# Patient Record
Sex: Male | Born: 1940 | Race: Black or African American | Hispanic: No | Marital: Single | Smoking: Former smoker
Health system: Southern US, Community
[De-identification: ages and names within clinical notes are randomized; demographics above are authoritative.]

## PROBLEM LIST (undated history)

## (undated) VITALS — BP 115/63 | HR 97 | Temp 98.0°F | Resp 24 | Ht 66.97 in | Wt 206.8 lb

## (undated) DIAGNOSIS — E612 Magnesium deficiency: Secondary | ICD-10-CM

## (undated) DIAGNOSIS — I5042 Chronic combined systolic (congestive) and diastolic (congestive) heart failure: Secondary | ICD-10-CM

## (undated) DIAGNOSIS — N3644 Muscular disorders of urethra: Secondary | ICD-10-CM

## (undated) DIAGNOSIS — R32 Unspecified urinary incontinence: Secondary | ICD-10-CM

## (undated) DIAGNOSIS — I714 Abdominal aortic aneurysm, without rupture, unspecified: Secondary | ICD-10-CM

## (undated) DIAGNOSIS — C679 Malignant neoplasm of bladder, unspecified: Secondary | ICD-10-CM

## (undated) DIAGNOSIS — F0390 Unspecified dementia without behavioral disturbance: Secondary | ICD-10-CM

## (undated) DIAGNOSIS — N3281 Overactive bladder: Secondary | ICD-10-CM

## (undated) DIAGNOSIS — Z992 Dependence on renal dialysis: Secondary | ICD-10-CM

## (undated) DIAGNOSIS — I723 Aneurysm of iliac artery: Secondary | ICD-10-CM

## (undated) DIAGNOSIS — J449 Chronic obstructive pulmonary disease, unspecified: Secondary | ICD-10-CM

## (undated) DIAGNOSIS — I639 Cerebral infarction, unspecified: Secondary | ICD-10-CM

## (undated) DIAGNOSIS — N186 End stage renal disease: Secondary | ICD-10-CM

## (undated) DIAGNOSIS — R41841 Cognitive communication deficit: Secondary | ICD-10-CM

## (undated) DIAGNOSIS — I499 Cardiac arrhythmia, unspecified: Secondary | ICD-10-CM

## (undated) DIAGNOSIS — I10 Essential (primary) hypertension: Secondary | ICD-10-CM

## (undated) DIAGNOSIS — G459 Transient cerebral ischemic attack, unspecified: Secondary | ICD-10-CM

## (undated) DIAGNOSIS — K219 Gastro-esophageal reflux disease without esophagitis: Secondary | ICD-10-CM

## (undated) DIAGNOSIS — C61 Malignant neoplasm of prostate: Secondary | ICD-10-CM

## (undated) DIAGNOSIS — E7800 Pure hypercholesterolemia, unspecified: Secondary | ICD-10-CM

## (undated) DIAGNOSIS — E119 Type 2 diabetes mellitus without complications: Secondary | ICD-10-CM

## (undated) DIAGNOSIS — I480 Paroxysmal atrial fibrillation: Secondary | ICD-10-CM

## (undated) DIAGNOSIS — Z8619 Personal history of other infectious and parasitic diseases: Secondary | ICD-10-CM

## (undated) DIAGNOSIS — N1330 Unspecified hydronephrosis: Secondary | ICD-10-CM

## (undated) DIAGNOSIS — J309 Allergic rhinitis, unspecified: Secondary | ICD-10-CM

## (undated) DIAGNOSIS — Z8719 Personal history of other diseases of the digestive system: Secondary | ICD-10-CM

## (undated) DIAGNOSIS — D649 Anemia, unspecified: Secondary | ICD-10-CM

## (undated) HISTORY — DX: Transient cerebral ischemic attack, unspecified: G45.9

## (undated) HISTORY — DX: Type 2 diabetes mellitus without complications: E11.9

## (undated) HISTORY — PX: CYSTOSCOPY W/ URETERAL STENT PLACEMENT: SHX1429

## (undated) HISTORY — DX: Unspecified hydronephrosis: N13.30

## (undated) HISTORY — PX: TRANSURETHRAL RESECTION OF BLADDER TUMOR WITH GYRUS (TURBT-GYRUS): SHX6458

## (undated) HISTORY — PX: URETERAL STENT PLACEMENT: SHX822

## (undated) HISTORY — DX: Muscular disorders of urethra: N36.44

## (undated) HISTORY — DX: Overactive bladder: N32.81

## (undated) HISTORY — DX: Cerebral infarction, unspecified: I63.9

## (undated) HISTORY — PX: AV FISTULA PLACEMENT: SHX1204

## (undated) HISTORY — PX: PROSTATE SURGERY: SHX751

## (undated) HISTORY — DX: Aneurysm of iliac artery: I72.3

---

## 2006-05-14 ENCOUNTER — Emergency Department: Payer: Self-pay | Admitting: Emergency Medicine

## 2006-12-31 ENCOUNTER — Emergency Department: Payer: Self-pay | Admitting: General Practice

## 2007-01-06 ENCOUNTER — Ambulatory Visit: Payer: Self-pay | Admitting: Urology

## 2007-01-07 ENCOUNTER — Inpatient Hospital Stay: Payer: Self-pay | Admitting: Urology

## 2007-01-07 ENCOUNTER — Other Ambulatory Visit: Payer: Self-pay

## 2007-01-08 ENCOUNTER — Other Ambulatory Visit: Payer: Self-pay

## 2007-01-21 ENCOUNTER — Ambulatory Visit: Payer: Self-pay | Admitting: Vascular Surgery

## 2007-02-18 ENCOUNTER — Ambulatory Visit: Payer: Self-pay | Admitting: Vascular Surgery

## 2007-03-04 ENCOUNTER — Ambulatory Visit: Payer: Self-pay | Admitting: Vascular Surgery

## 2007-03-11 ENCOUNTER — Inpatient Hospital Stay: Payer: Self-pay | Admitting: Vascular Surgery

## 2007-03-12 ENCOUNTER — Other Ambulatory Visit: Payer: Self-pay

## 2007-10-19 ENCOUNTER — Other Ambulatory Visit: Payer: Self-pay

## 2007-10-19 ENCOUNTER — Inpatient Hospital Stay: Payer: Self-pay | Admitting: Internal Medicine

## 2009-09-04 ENCOUNTER — Inpatient Hospital Stay: Payer: Self-pay | Admitting: Internal Medicine

## 2010-06-07 ENCOUNTER — Emergency Department: Payer: Self-pay | Admitting: Emergency Medicine

## 2011-02-26 ENCOUNTER — Other Ambulatory Visit: Payer: Self-pay | Admitting: Nurse Practitioner

## 2011-03-18 ENCOUNTER — Ambulatory Visit: Payer: Self-pay | Admitting: Gastroenterology

## 2011-04-29 ENCOUNTER — Emergency Department: Payer: Self-pay | Admitting: Emergency Medicine

## 2011-07-18 ENCOUNTER — Ambulatory Visit: Payer: Self-pay | Admitting: Urology

## 2011-07-19 ENCOUNTER — Ambulatory Visit: Payer: Self-pay | Admitting: Urology

## 2011-07-19 DIAGNOSIS — I10 Essential (primary) hypertension: Secondary | ICD-10-CM

## 2011-07-23 ENCOUNTER — Ambulatory Visit: Payer: Self-pay | Admitting: Urology

## 2011-07-25 LAB — PATHOLOGY REPORT

## 2011-07-27 ENCOUNTER — Inpatient Hospital Stay: Payer: Self-pay

## 2011-07-28 ENCOUNTER — Ambulatory Visit: Payer: Self-pay | Admitting: Urology

## 2011-07-30 ENCOUNTER — Ambulatory Visit: Payer: Self-pay | Admitting: Urology

## 2011-08-22 ENCOUNTER — Ambulatory Visit: Payer: Self-pay | Admitting: Internal Medicine

## 2011-08-24 ENCOUNTER — Ambulatory Visit: Payer: Self-pay | Admitting: Internal Medicine

## 2011-09-05 ENCOUNTER — Ambulatory Visit: Payer: Self-pay | Admitting: Internal Medicine

## 2011-09-06 LAB — PSA: PSA: 0.8 ng/mL (ref 0.0–4.0)

## 2011-09-23 ENCOUNTER — Ambulatory Visit: Payer: Self-pay | Admitting: Urology

## 2011-09-24 ENCOUNTER — Ambulatory Visit: Payer: Self-pay | Admitting: Internal Medicine

## 2011-10-01 ENCOUNTER — Ambulatory Visit: Payer: Self-pay | Admitting: Urology

## 2011-10-03 LAB — PATHOLOGY REPORT

## 2011-10-25 ENCOUNTER — Ambulatory Visit: Payer: Self-pay | Admitting: Urology

## 2011-11-06 ENCOUNTER — Ambulatory Visit: Payer: Self-pay | Admitting: Urology

## 2011-11-06 LAB — PLATELET COUNT: Platelet: 284 10*3/uL (ref 150–440)

## 2011-11-06 LAB — PROTIME-INR: INR: 1

## 2011-11-06 LAB — APTT: Activated PTT: 37.9 secs — ABNORMAL HIGH (ref 23.6–35.9)

## 2011-11-21 ENCOUNTER — Ambulatory Visit: Payer: Self-pay | Admitting: Urology

## 2011-11-21 LAB — PROTIME-INR: INR: 1

## 2011-11-21 LAB — PLATELET COUNT: Platelet: 293 10*3/uL (ref 150–440)

## 2011-12-04 ENCOUNTER — Ambulatory Visit: Payer: Self-pay | Admitting: Urology

## 2011-12-04 LAB — APTT: Activated PTT: 31.7 secs (ref 23.6–35.9)

## 2011-12-04 LAB — HEMOGLOBIN: HGB: 10.5 g/dL — ABNORMAL LOW (ref 13.0–18.0)

## 2011-12-04 LAB — HEMATOCRIT: HCT: 30.3 % — ABNORMAL LOW (ref 40.0–52.0)

## 2011-12-04 LAB — PROTIME-INR
INR: 1
Prothrombin Time: 13.4 secs (ref 11.5–14.7)

## 2011-12-05 LAB — CBC WITH DIFFERENTIAL/PLATELET
Basophil %: 0.2 %
Eosinophil %: 0.3 %
HCT: 25.7 % — ABNORMAL LOW (ref 40.0–52.0)
HGB: 8.5 g/dL — ABNORMAL LOW (ref 13.0–18.0)
Lymphocyte %: 9.2 %
Monocyte #: 0.9 10*3/uL — ABNORMAL HIGH (ref 0.0–0.7)
Monocyte %: 8.6 %
Neutrophil #: 8.8 10*3/uL — ABNORMAL HIGH (ref 1.4–6.5)
Neutrophil %: 81.7 %
Platelet: 275 10*3/uL (ref 150–440)
WBC: 10.7 10*3/uL — ABNORMAL HIGH (ref 3.8–10.6)

## 2011-12-05 LAB — BASIC METABOLIC PANEL
Calcium, Total: 8.2 mg/dL — ABNORMAL LOW (ref 8.5–10.1)
Chloride: 106 mmol/L (ref 98–107)
Glucose: 104 mg/dL — ABNORMAL HIGH (ref 65–99)
Potassium: 3.9 mmol/L (ref 3.5–5.1)
Sodium: 139 mmol/L (ref 136–145)

## 2011-12-08 LAB — COMPREHENSIVE METABOLIC PANEL
Albumin: 2.9 g/dL — ABNORMAL LOW (ref 3.4–5.0)
Alkaline Phosphatase: 82 U/L (ref 50–136)
BUN: 27 mg/dL — ABNORMAL HIGH (ref 7–18)
Calcium, Total: 9.1 mg/dL (ref 8.5–10.1)
Creatinine: 2.43 mg/dL — ABNORMAL HIGH (ref 0.60–1.30)
Glucose: 109 mg/dL — ABNORMAL HIGH (ref 65–99)
Osmolality: 289 (ref 275–301)
Potassium: 3.7 mmol/L (ref 3.5–5.1)
SGOT(AST): 15 U/L (ref 15–37)
SGPT (ALT): 9 U/L — ABNORMAL LOW
Total Protein: 7.5 g/dL (ref 6.4–8.2)

## 2011-12-08 LAB — URINALYSIS, COMPLETE
Ketone: NEGATIVE
Nitrite: POSITIVE
Ph: 6 (ref 4.5–8.0)
Protein: 30
RBC,UR: 48415 /HPF (ref 0–5)
WBC UR: 1270 /HPF (ref 0–5)

## 2011-12-08 LAB — PROTIME-INR
INR: 1
Prothrombin Time: 13.9 secs (ref 11.5–14.7)

## 2011-12-08 LAB — CBC
HCT: 29.9 % — ABNORMAL LOW (ref 40.0–52.0)
HGB: 9.8 g/dL — ABNORMAL LOW (ref 13.0–18.0)
MCH: 27.5 pg (ref 26.0–34.0)
MCV: 84 fL (ref 80–100)
RBC: 3.56 10*6/uL — ABNORMAL LOW (ref 4.40–5.90)
RDW: 16.1 % — ABNORMAL HIGH (ref 11.5–14.5)
WBC: 10 10*3/uL (ref 3.8–10.6)

## 2011-12-08 LAB — CK: CK, Total: 40 U/L (ref 35–232)

## 2011-12-08 LAB — PRO B NATRIURETIC PEPTIDE: B-Type Natriuretic Peptide: 889 pg/mL — ABNORMAL HIGH (ref 0–125)

## 2011-12-08 LAB — TROPONIN I: Troponin-I: 0.06 ng/mL — ABNORMAL HIGH

## 2011-12-08 LAB — CK TOTAL AND CKMB (NOT AT ARMC): CK, Total: 48 U/L (ref 35–232)

## 2011-12-09 LAB — CBC WITH DIFFERENTIAL/PLATELET
Basophil %: 0.4 %
Eosinophil %: 2.8 %
Lymphocyte #: 1.5 10*3/uL (ref 1.0–3.6)
MCV: 83 fL (ref 80–100)
Monocyte %: 12.3 %
Neutrophil %: 69.1 %
Platelet: 302 10*3/uL (ref 150–440)
RBC: 3.18 10*6/uL — ABNORMAL LOW (ref 4.40–5.90)
RDW: 15.8 % — ABNORMAL HIGH (ref 11.5–14.5)

## 2011-12-09 LAB — BASIC METABOLIC PANEL
Anion Gap: 8 (ref 7–16)
BUN: 27 mg/dL — ABNORMAL HIGH (ref 7–18)
Chloride: 110 mmol/L — ABNORMAL HIGH (ref 98–107)
Co2: 22 mmol/L (ref 21–32)
Creatinine: 2.41 mg/dL — ABNORMAL HIGH (ref 0.60–1.30)
EGFR (African American): 34 — ABNORMAL LOW
EGFR (Non-African Amer.): 28 — ABNORMAL LOW
Glucose: 112 mg/dL — ABNORMAL HIGH (ref 65–99)
Osmolality: 285 (ref 275–301)
Potassium: 4 mmol/L (ref 3.5–5.1)
Sodium: 140 mmol/L (ref 136–145)

## 2011-12-09 LAB — CK TOTAL AND CKMB (NOT AT ARMC)
CK, Total: 35 U/L (ref 35–232)
CK-MB: 0.6 ng/mL (ref 0.5–3.6)

## 2011-12-10 ENCOUNTER — Inpatient Hospital Stay: Payer: Self-pay | Admitting: Internal Medicine

## 2011-12-10 LAB — BASIC METABOLIC PANEL
Anion Gap: 15 (ref 7–16)
BUN: 31 mg/dL — ABNORMAL HIGH (ref 7–18)
Creatinine: 2.69 mg/dL — ABNORMAL HIGH (ref 0.60–1.30)
EGFR (African American): 30 — ABNORMAL LOW
EGFR (Non-African Amer.): 25 — ABNORMAL LOW
Glucose: 121 mg/dL — ABNORMAL HIGH (ref 65–99)
Osmolality: 295 (ref 275–301)

## 2011-12-10 LAB — CBC WITH DIFFERENTIAL/PLATELET
Eosinophil #: 0.4 10*3/uL (ref 0.0–0.7)
Lymphocyte %: 20.3 %
MCH: 27.3 pg (ref 26.0–34.0)
MCHC: 32.8 g/dL (ref 32.0–36.0)
Monocyte #: 0.9 10*3/uL — ABNORMAL HIGH (ref 0.0–0.7)
Neutrophil %: 65 %
Platelet: 321 10*3/uL (ref 150–440)
RBC: 3.1 10*6/uL — ABNORMAL LOW (ref 4.40–5.90)
RDW: 16.1 % — ABNORMAL HIGH (ref 11.5–14.5)
WBC: 8.9 10*3/uL (ref 3.8–10.6)

## 2011-12-10 LAB — PHOSPHORUS: Phosphorus: 3.7 mg/dL (ref 2.5–4.9)

## 2011-12-11 LAB — CBC WITH DIFFERENTIAL/PLATELET
Basophil %: 0.4 %
Eosinophil %: 6.1 %
HGB: 7.6 g/dL — ABNORMAL LOW (ref 13.0–18.0)
Lymphocyte #: 1.7 10*3/uL (ref 1.0–3.6)
MCH: 27.1 pg (ref 26.0–34.0)
MCV: 84 fL (ref 80–100)
Monocyte #: 0.7 10*3/uL (ref 0.0–0.7)
Monocyte %: 9.5 %
Neutrophil #: 4.6 10*3/uL (ref 1.4–6.5)
RBC: 2.79 10*6/uL — ABNORMAL LOW (ref 4.40–5.90)
WBC: 7.5 10*3/uL (ref 3.8–10.6)

## 2011-12-11 LAB — BASIC METABOLIC PANEL
BUN: 26 mg/dL — ABNORMAL HIGH (ref 7–18)
Chloride: 113 mmol/L — ABNORMAL HIGH (ref 98–107)
Creatinine: 2.09 mg/dL — ABNORMAL HIGH (ref 0.60–1.30)
EGFR (African American): 41 — ABNORMAL LOW
EGFR (Non-African Amer.): 34 — ABNORMAL LOW
Glucose: 105 mg/dL — ABNORMAL HIGH (ref 65–99)
Osmolality: 285 (ref 275–301)
Potassium: 4.1 mmol/L (ref 3.5–5.1)
Sodium: 140 mmol/L (ref 136–145)

## 2011-12-11 LAB — PROTEIN / CREATININE RATIO, URINE
Protein, Random Urine: 88 mg/dL — ABNORMAL HIGH (ref 0–12)
Protein/Creat. Ratio: 1760 mg/gCREAT — ABNORMAL HIGH (ref 0–200)

## 2011-12-12 LAB — CBC WITH DIFFERENTIAL/PLATELET
Basophil %: 0.6 %
Eosinophil #: 0.4 10*3/uL (ref 0.0–0.7)
Eosinophil %: 5.6 %
HCT: 27.2 % — ABNORMAL LOW (ref 40.0–52.0)
HGB: 8.8 g/dL — ABNORMAL LOW (ref 13.0–18.0)
MCH: 27 pg (ref 26.0–34.0)
MCHC: 32.4 g/dL (ref 32.0–36.0)
MCV: 83 fL (ref 80–100)
Monocyte #: 0.7 10*3/uL (ref 0.0–0.7)
Monocyte %: 10.3 %
Neutrophil #: 4.2 10*3/uL (ref 1.4–6.5)
Neutrophil %: 62.8 %
RBC: 3.26 10*6/uL — ABNORMAL LOW (ref 4.40–5.90)
WBC: 6.8 10*3/uL (ref 3.8–10.6)

## 2011-12-12 LAB — BASIC METABOLIC PANEL
BUN: 26 mg/dL — ABNORMAL HIGH (ref 7–18)
Calcium, Total: 8.6 mg/dL (ref 8.5–10.1)
Chloride: 110 mmol/L — ABNORMAL HIGH (ref 98–107)
Co2: 23 mmol/L (ref 21–32)
Creatinine: 2.06 mg/dL — ABNORMAL HIGH (ref 0.60–1.30)
Glucose: 105 mg/dL — ABNORMAL HIGH (ref 65–99)
Osmolality: 294 (ref 275–301)
Sodium: 145 mmol/L (ref 136–145)

## 2011-12-12 LAB — URINALYSIS, COMPLETE
Bacteria: NONE SEEN
Bilirubin,UR: NEGATIVE
Glucose,UR: NEGATIVE mg/dL (ref 0–75)
RBC,UR: 116 /HPF (ref 0–5)
Squamous Epithelial: NONE SEEN
WBC UR: 809 /HPF (ref 0–5)

## 2011-12-14 LAB — BASIC METABOLIC PANEL
Anion Gap: 13 (ref 7–16)
BUN: 21 mg/dL — ABNORMAL HIGH (ref 7–18)
Calcium, Total: 8.7 mg/dL (ref 8.5–10.1)
Chloride: 110 mmol/L — ABNORMAL HIGH (ref 98–107)
Co2: 23 mmol/L (ref 21–32)
Creatinine: 1.92 mg/dL — ABNORMAL HIGH (ref 0.60–1.30)
EGFR (African American): 45 — ABNORMAL LOW
Osmolality: 294 (ref 275–301)
Potassium: 4.1 mmol/L (ref 3.5–5.1)
Sodium: 146 mmol/L — ABNORMAL HIGH (ref 136–145)

## 2011-12-14 LAB — CBC WITH DIFFERENTIAL/PLATELET
Eosinophil %: 3.7 %
HGB: 9.4 g/dL — ABNORMAL LOW (ref 13.0–18.0)
MCHC: 33.3 g/dL (ref 32.0–36.0)
Monocyte %: 8.8 %
Neutrophil %: 61 %
Platelet: 374 10*3/uL (ref 150–440)
RBC: 3.41 10*6/uL — ABNORMAL LOW (ref 4.40–5.90)

## 2012-08-27 DIAGNOSIS — C7951 Secondary malignant neoplasm of bone: Secondary | ICD-10-CM | POA: Insufficient documentation

## 2012-08-27 DIAGNOSIS — N390 Urinary tract infection, site not specified: Secondary | ICD-10-CM | POA: Insufficient documentation

## 2012-08-27 DIAGNOSIS — I723 Aneurysm of iliac artery: Secondary | ICD-10-CM | POA: Insufficient documentation

## 2012-08-27 DIAGNOSIS — I714 Abdominal aortic aneurysm, without rupture: Secondary | ICD-10-CM | POA: Insufficient documentation

## 2012-08-27 DIAGNOSIS — R339 Retention of urine, unspecified: Secondary | ICD-10-CM | POA: Insufficient documentation

## 2012-08-27 DIAGNOSIS — C679 Malignant neoplasm of bladder, unspecified: Secondary | ICD-10-CM | POA: Insufficient documentation

## 2012-08-27 DIAGNOSIS — C61 Malignant neoplasm of prostate: Secondary | ICD-10-CM | POA: Insufficient documentation

## 2012-08-27 DIAGNOSIS — N185 Chronic kidney disease, stage 5: Secondary | ICD-10-CM | POA: Insufficient documentation

## 2012-08-27 DIAGNOSIS — N1330 Unspecified hydronephrosis: Secondary | ICD-10-CM | POA: Insufficient documentation

## 2012-10-15 DIAGNOSIS — N3000 Acute cystitis without hematuria: Secondary | ICD-10-CM | POA: Insufficient documentation

## 2012-10-19 ENCOUNTER — Ambulatory Visit: Payer: Self-pay | Admitting: Urology

## 2012-10-19 LAB — BASIC METABOLIC PANEL
BUN: 31 mg/dL — ABNORMAL HIGH (ref 7–18)
Calcium, Total: 8.9 mg/dL (ref 8.5–10.1)
Chloride: 108 mmol/L — ABNORMAL HIGH (ref 98–107)
Creatinine: 3.14 mg/dL — ABNORMAL HIGH (ref 0.60–1.30)
Glucose: 111 mg/dL — ABNORMAL HIGH (ref 65–99)
Osmolality: 288 (ref 275–301)
Potassium: 3.9 mmol/L (ref 3.5–5.1)
Sodium: 141 mmol/L (ref 136–145)

## 2012-10-19 LAB — CBC WITH DIFFERENTIAL/PLATELET
Basophil %: 0.2 %
HCT: 34.8 % — ABNORMAL LOW (ref 40.0–52.0)
HGB: 11.4 g/dL — ABNORMAL LOW (ref 13.0–18.0)
Lymphocyte %: 13.6 %
MCH: 27.5 pg (ref 26.0–34.0)
Monocyte %: 5.9 %
Neutrophil %: 78.1 %
Platelet: 366 10*3/uL (ref 150–440)
RBC: 4.13 10*6/uL — ABNORMAL LOW (ref 4.40–5.90)
RDW: 16.9 % — ABNORMAL HIGH (ref 11.5–14.5)
WBC: 9.6 10*3/uL (ref 3.8–10.6)

## 2012-10-20 ENCOUNTER — Inpatient Hospital Stay: Payer: Self-pay | Admitting: Internal Medicine

## 2012-10-20 LAB — CBC
HCT: 32 % — ABNORMAL LOW (ref 40.0–52.0)
HGB: 10.5 g/dL — ABNORMAL LOW (ref 13.0–18.0)
MCH: 27.8 pg (ref 26.0–34.0)
MCHC: 32.8 g/dL (ref 32.0–36.0)
Platelet: 322 10*3/uL (ref 150–440)
RDW: 16.5 % — ABNORMAL HIGH (ref 11.5–14.5)
WBC: 8 10*3/uL (ref 3.8–10.6)

## 2012-10-20 LAB — URINALYSIS, COMPLETE
Bilirubin,UR: NEGATIVE
Glucose,UR: NEGATIVE mg/dL (ref 0–75)
Ketone: NEGATIVE
Nitrite: NEGATIVE
Ph: 6 (ref 4.5–8.0)
RBC,UR: 14 /HPF (ref 0–5)
WBC UR: 523 /HPF (ref 0–5)

## 2012-10-20 LAB — COMPREHENSIVE METABOLIC PANEL
Albumin: 3 g/dL — ABNORMAL LOW (ref 3.4–5.0)
Alkaline Phosphatase: 107 U/L (ref 50–136)
Anion Gap: 11 (ref 7–16)
Bilirubin,Total: 0.4 mg/dL (ref 0.2–1.0)
Chloride: 108 mmol/L — ABNORMAL HIGH (ref 98–107)
Co2: 22 mmol/L (ref 21–32)
EGFR (African American): 20 — ABNORMAL LOW
EGFR (Non-African Amer.): 17 — ABNORMAL LOW
Glucose: 104 mg/dL — ABNORMAL HIGH (ref 65–99)
SGPT (ALT): 8 U/L — ABNORMAL LOW (ref 12–78)
Sodium: 141 mmol/L (ref 136–145)
Total Protein: 7.6 g/dL (ref 6.4–8.2)

## 2012-10-20 LAB — TROPONIN I: Troponin-I: <0.02 ng/mL

## 2012-10-21 LAB — BASIC METABOLIC PANEL
Anion Gap: 9 (ref 7–16)
Calcium, Total: 8.2 mg/dL — ABNORMAL LOW (ref 8.5–10.1)
Co2: 20 mmol/L — ABNORMAL LOW (ref 21–32)
EGFR (African American): 24 — ABNORMAL LOW
Glucose: 87 mg/dL (ref 65–99)
Osmolality: 291 (ref 275–301)
Potassium: 3.5 mmol/L (ref 3.5–5.1)
Sodium: 143 mmol/L (ref 136–145)

## 2012-10-21 LAB — CBC WITH DIFFERENTIAL/PLATELET
Basophil #: 0 10*3/uL (ref 0.0–0.1)
Eosinophil #: 0.5 10*3/uL (ref 0.0–0.7)
MCH: 27.4 pg (ref 26.0–34.0)
MCHC: 32.5 g/dL (ref 32.0–36.0)
MCV: 84 fL (ref 80–100)
Monocyte #: 0.8 x10 3/mm (ref 0.2–1.0)
Monocyte %: 11.1 %
Neutrophil %: 49.1 %
Platelet: 296 10*3/uL (ref 150–440)
RBC: 3.6 10*6/uL — ABNORMAL LOW (ref 4.40–5.90)
RDW: 17 % — ABNORMAL HIGH (ref 11.5–14.5)
WBC: 7.2 10*3/uL (ref 3.8–10.6)

## 2012-10-22 LAB — CBC WITH DIFFERENTIAL/PLATELET
Basophil %: 0.5 %
HGB: 9.8 g/dL — ABNORMAL LOW (ref 13.0–18.0)
MCH: 27.4 pg (ref 26.0–34.0)
MCV: 84 fL (ref 80–100)
Platelet: 300 10*3/uL (ref 150–440)
RDW: 16.8 % — ABNORMAL HIGH (ref 11.5–14.5)
WBC: 6.5 10*3/uL (ref 3.8–10.6)

## 2012-10-22 LAB — BASIC METABOLIC PANEL
Calcium, Total: 8.1 mg/dL — ABNORMAL LOW (ref 8.5–10.1)
Chloride: 114 mmol/L — ABNORMAL HIGH (ref 98–107)
Co2: 21 mmol/L (ref 21–32)
Creatinine: 2.88 mg/dL — ABNORMAL HIGH (ref 0.60–1.30)
Glucose: 90 mg/dL (ref 65–99)
Osmolality: 290 (ref 275–301)
Potassium: 3.5 mmol/L (ref 3.5–5.1)
Sodium: 143 mmol/L (ref 136–145)

## 2012-10-27 ENCOUNTER — Ambulatory Visit: Payer: Self-pay | Admitting: Urology

## 2012-10-29 LAB — PATHOLOGY REPORT

## 2012-11-11 DIAGNOSIS — N318 Other neuromuscular dysfunction of bladder: Secondary | ICD-10-CM | POA: Insufficient documentation

## 2012-11-11 DIAGNOSIS — N3090 Cystitis, unspecified without hematuria: Secondary | ICD-10-CM | POA: Insufficient documentation

## 2013-03-04 ENCOUNTER — Ambulatory Visit: Payer: Self-pay | Admitting: Nephrology

## 2013-03-05 ENCOUNTER — Inpatient Hospital Stay: Payer: Self-pay | Admitting: Internal Medicine

## 2013-03-05 LAB — URINALYSIS, COMPLETE
Bilirubin,UR: NEGATIVE
Glucose,UR: NEGATIVE mg/dL (ref 0–75)
Ketone: NEGATIVE
Ph: 6 (ref 4.5–8.0)
Protein: 30
Specific Gravity: 1.009 (ref 1.003–1.030)
Squamous Epithelial: NONE SEEN

## 2013-03-05 LAB — COMPREHENSIVE METABOLIC PANEL
Albumin: 2.9 g/dL — ABNORMAL LOW (ref 3.4–5.0)
Alkaline Phosphatase: 93 U/L (ref 50–136)
BUN: 31 mg/dL — ABNORMAL HIGH (ref 7–18)
Bilirubin,Total: 0.2 mg/dL (ref 0.2–1.0)
Calcium, Total: 8.6 mg/dL (ref 8.5–10.1)
Co2: 22 mmol/L (ref 21–32)
Creatinine: 3 mg/dL — ABNORMAL HIGH (ref 0.60–1.30)
EGFR (African American): 23 — ABNORMAL LOW
EGFR (Non-African Amer.): 20 — ABNORMAL LOW
Glucose: 105 mg/dL — ABNORMAL HIGH (ref 65–99)
Osmolality: 290 (ref 275–301)
Potassium: 3.8 mmol/L (ref 3.5–5.1)
SGOT(AST): 15 U/L (ref 15–37)
Sodium: 142 mmol/L (ref 136–145)

## 2013-03-05 LAB — CBC
HCT: 28.7 % — ABNORMAL LOW (ref 40.0–52.0)
HGB: 9.5 g/dL — ABNORMAL LOW (ref 13.0–18.0)
MCH: 26.7 pg (ref 26.0–34.0)
RBC: 3.54 10*6/uL — ABNORMAL LOW (ref 4.40–5.90)
WBC: 7.9 10*3/uL (ref 3.8–10.6)

## 2013-03-06 ENCOUNTER — Ambulatory Visit: Payer: Self-pay

## 2013-03-06 LAB — CBC WITH DIFFERENTIAL/PLATELET
Basophil %: 0.7 %
Eosinophil #: 0.5 10*3/uL (ref 0.0–0.7)
HGB: 9.7 g/dL — ABNORMAL LOW (ref 13.0–18.0)
MCH: 26.9 pg (ref 26.0–34.0)
MCV: 80 fL (ref 80–100)
Monocyte %: 7.8 %
Neutrophil %: 57.2 %
WBC: 8.1 10*3/uL (ref 3.8–10.6)

## 2013-03-06 LAB — COMPREHENSIVE METABOLIC PANEL
Alkaline Phosphatase: 101 U/L (ref 50–136)
BUN: 26 mg/dL — ABNORMAL HIGH (ref 7–18)
Bilirubin,Total: 0.4 mg/dL (ref 0.2–1.0)
Calcium, Total: 8.9 mg/dL (ref 8.5–10.1)
Co2: 23 mmol/L (ref 21–32)
EGFR (African American): 24 — ABNORMAL LOW
Glucose: 126 mg/dL — ABNORMAL HIGH (ref 65–99)
Osmolality: 293 (ref 275–301)
Sodium: 144 mmol/L (ref 136–145)
Total Protein: 7.3 g/dL (ref 6.4–8.2)

## 2013-03-06 LAB — HEMOGLOBIN A1C: Hemoglobin A1C: 6.8 % — ABNORMAL HIGH (ref 4.2–6.3)

## 2013-04-13 ENCOUNTER — Ambulatory Visit: Payer: Self-pay | Admitting: Internal Medicine

## 2014-01-30 ENCOUNTER — Inpatient Hospital Stay: Payer: Self-pay | Admitting: Family Medicine

## 2014-01-30 LAB — COMPREHENSIVE METABOLIC PANEL
ALBUMIN: 3.1 g/dL — AB (ref 3.4–5.0)
ALK PHOS: 96 U/L
AST: 9 U/L — AB (ref 15–37)
Anion Gap: 9 (ref 7–16)
BUN: 32 mg/dL — AB (ref 7–18)
Bilirubin,Total: 0.3 mg/dL (ref 0.2–1.0)
CREATININE: 3.07 mg/dL — AB (ref 0.60–1.30)
Calcium, Total: 9.1 mg/dL (ref 8.5–10.1)
Chloride: 112 mmol/L — ABNORMAL HIGH (ref 98–107)
Co2: 21 mmol/L (ref 21–32)
EGFR (Non-African Amer.): 19 — ABNORMAL LOW
GFR CALC AF AMER: 22 — AB
GLUCOSE: 124 mg/dL — AB (ref 65–99)
Osmolality: 291 (ref 275–301)
Potassium: 3.7 mmol/L (ref 3.5–5.1)
SGPT (ALT): 7 U/L — ABNORMAL LOW (ref 12–78)
Sodium: 142 mmol/L (ref 136–145)
Total Protein: 8 g/dL (ref 6.4–8.2)

## 2014-01-30 LAB — URINALYSIS, COMPLETE
BILIRUBIN, UR: NEGATIVE
GLUCOSE, UR: NEGATIVE mg/dL (ref 0–75)
KETONE: NEGATIVE
Nitrite: NEGATIVE
PH: 7 (ref 4.5–8.0)
Protein: 30
RBC,UR: 41 /HPF (ref 0–5)
Specific Gravity: 1.009 (ref 1.003–1.030)
Squamous Epithelial: 1

## 2014-01-30 LAB — CBC WITH DIFFERENTIAL/PLATELET
Basophil #: 0.1 10*3/uL (ref 0.0–0.1)
Basophil %: 0.7 %
EOS ABS: 0.3 10*3/uL (ref 0.0–0.7)
EOS PCT: 3.2 %
HCT: 30.9 % — AB (ref 40.0–52.0)
HGB: 9.7 g/dL — AB (ref 13.0–18.0)
Lymphocyte #: 1.7 10*3/uL (ref 1.0–3.6)
Lymphocyte %: 20.3 %
MCH: 25.4 pg — ABNORMAL LOW (ref 26.0–34.0)
MCHC: 31.5 g/dL — ABNORMAL LOW (ref 32.0–36.0)
MCV: 80 fL (ref 80–100)
MONO ABS: 0.6 x10 3/mm (ref 0.2–1.0)
Monocyte %: 7.3 %
Neutrophil #: 5.8 10*3/uL (ref 1.4–6.5)
Neutrophil %: 68.5 %
Platelet: 334 10*3/uL (ref 150–440)
RBC: 3.84 10*6/uL — ABNORMAL LOW (ref 4.40–5.90)
RDW: 17.7 % — ABNORMAL HIGH (ref 11.5–14.5)
WBC: 8.5 10*3/uL (ref 3.8–10.6)

## 2014-01-30 LAB — TROPONIN I: Troponin-I: <0.02 ng/mL

## 2014-01-31 LAB — BASIC METABOLIC PANEL
ANION GAP: 6 — AB (ref 7–16)
BUN: 30 mg/dL — ABNORMAL HIGH (ref 7–18)
Calcium, Total: 8.6 mg/dL (ref 8.5–10.1)
Chloride: 113 mmol/L — ABNORMAL HIGH (ref 98–107)
Co2: 23 mmol/L (ref 21–32)
Creatinine: 2.74 mg/dL — ABNORMAL HIGH (ref 0.60–1.30)
EGFR (African American): 26 — ABNORMAL LOW
EGFR (Non-African Amer.): 22 — ABNORMAL LOW
Glucose: 115 mg/dL — ABNORMAL HIGH (ref 65–99)
Osmolality: 290 (ref 275–301)
Potassium: 3.8 mmol/L (ref 3.5–5.1)
Sodium: 142 mmol/L (ref 136–145)

## 2014-02-01 LAB — BASIC METABOLIC PANEL
Anion Gap: 7 (ref 7–16)
BUN: 27 mg/dL — AB (ref 7–18)
CHLORIDE: 115 mmol/L — AB (ref 98–107)
CO2: 21 mmol/L (ref 21–32)
Calcium, Total: 8.7 mg/dL (ref 8.5–10.1)
Creatinine: 2.79 mg/dL — ABNORMAL HIGH (ref 0.60–1.30)
EGFR (African American): 25 — ABNORMAL LOW
GFR CALC NON AF AMER: 22 — AB
GLUCOSE: 97 mg/dL (ref 65–99)
OSMOLALITY: 290 (ref 275–301)
POTASSIUM: 3.9 mmol/L (ref 3.5–5.1)
SODIUM: 143 mmol/L (ref 136–145)

## 2014-02-01 LAB — CBC WITH DIFFERENTIAL/PLATELET
BASOS ABS: 0 10*3/uL (ref 0.0–0.1)
Basophil %: 0.6 %
EOS PCT: 6.4 %
Eosinophil #: 0.5 10*3/uL (ref 0.0–0.7)
HCT: 27.2 % — ABNORMAL LOW (ref 40.0–52.0)
HGB: 8.6 g/dL — ABNORMAL LOW (ref 13.0–18.0)
Lymphocyte #: 1.9 10*3/uL (ref 1.0–3.6)
Lymphocyte %: 26.4 %
MCH: 25.5 pg — ABNORMAL LOW (ref 26.0–34.0)
MCHC: 31.8 g/dL — ABNORMAL LOW (ref 32.0–36.0)
MCV: 80 fL (ref 80–100)
MONO ABS: 0.9 x10 3/mm (ref 0.2–1.0)
Monocyte %: 11.6 %
NEUTROS ABS: 4 10*3/uL (ref 1.4–6.5)
NEUTROS PCT: 55 %
Platelet: 266 10*3/uL (ref 150–440)
RBC: 3.4 10*6/uL — AB (ref 4.40–5.90)
RDW: 17.8 % — ABNORMAL HIGH (ref 11.5–14.5)
WBC: 7.4 10*3/uL (ref 3.8–10.6)

## 2014-02-01 LAB — PHOSPHORUS: PHOSPHORUS: 3.9 mg/dL (ref 2.5–4.9)

## 2014-02-02 LAB — URINE CULTURE

## 2014-02-04 LAB — CULTURE, BLOOD (SINGLE)

## 2014-03-01 DIAGNOSIS — N186 End stage renal disease: Secondary | ICD-10-CM | POA: Insufficient documentation

## 2014-03-01 DIAGNOSIS — Z992 Dependence on renal dialysis: Secondary | ICD-10-CM

## 2014-03-01 DIAGNOSIS — N184 Chronic kidney disease, stage 4 (severe): Secondary | ICD-10-CM | POA: Insufficient documentation

## 2014-03-01 DIAGNOSIS — N319 Neuromuscular dysfunction of bladder, unspecified: Secondary | ICD-10-CM | POA: Insufficient documentation

## 2014-03-01 DIAGNOSIS — R32 Unspecified urinary incontinence: Secondary | ICD-10-CM | POA: Insufficient documentation

## 2014-03-01 DIAGNOSIS — I10 Essential (primary) hypertension: Secondary | ICD-10-CM | POA: Insufficient documentation

## 2014-04-11 ENCOUNTER — Ambulatory Visit: Payer: Self-pay | Admitting: Urology

## 2014-04-11 LAB — CBC WITH DIFFERENTIAL/PLATELET
Basophil #: 0.1 10*3/uL (ref 0.0–0.1)
Basophil %: 0.6 %
Eosinophil #: 0.4 10*3/uL (ref 0.0–0.7)
Eosinophil %: 5.1 %
HCT: 30.9 % — AB (ref 40.0–52.0)
HGB: 9.5 g/dL — AB (ref 13.0–18.0)
LYMPHS ABS: 2.3 10*3/uL (ref 1.0–3.6)
Lymphocyte %: 27 %
MCH: 25.6 pg — AB (ref 26.0–34.0)
MCHC: 30.8 g/dL — ABNORMAL LOW (ref 32.0–36.0)
MCV: 83 fL (ref 80–100)
Monocyte #: 0.8 x10 3/mm (ref 0.2–1.0)
Monocyte %: 10.1 %
Neutrophil #: 4.8 10*3/uL (ref 1.4–6.5)
Neutrophil %: 57.2 %
Platelet: 314 10*3/uL (ref 150–440)
RBC: 3.73 10*6/uL — ABNORMAL LOW (ref 4.40–5.90)
RDW: 18.9 % — ABNORMAL HIGH (ref 11.5–14.5)
WBC: 8.4 10*3/uL (ref 3.8–10.6)

## 2014-04-11 LAB — BASIC METABOLIC PANEL
ANION GAP: 8 (ref 7–16)
BUN: 28 mg/dL — ABNORMAL HIGH (ref 7–18)
CALCIUM: 8.6 mg/dL (ref 8.5–10.1)
CHLORIDE: 111 mmol/L — AB (ref 98–107)
CO2: 22 mmol/L (ref 21–32)
CREATININE: 3.04 mg/dL — AB (ref 0.60–1.30)
EGFR (Non-African Amer.): 20 — ABNORMAL LOW
GFR CALC AF AMER: 23 — AB
GLUCOSE: 123 mg/dL — AB (ref 65–99)
OSMOLALITY: 288 (ref 275–301)
Potassium: 3.4 mmol/L — ABNORMAL LOW (ref 3.5–5.1)
Sodium: 141 mmol/L (ref 136–145)

## 2014-04-19 ENCOUNTER — Ambulatory Visit: Payer: Self-pay | Admitting: Urology

## 2014-04-21 LAB — PATHOLOGY REPORT

## 2014-06-24 ENCOUNTER — Ambulatory Visit: Payer: Self-pay | Admitting: Nephrology

## 2014-06-28 ENCOUNTER — Inpatient Hospital Stay: Payer: Self-pay | Admitting: Family Medicine

## 2014-06-28 LAB — CBC WITH DIFFERENTIAL/PLATELET
BASOS ABS: 0 10*3/uL (ref 0.0–0.1)
Basophil %: 0.5 %
EOS PCT: 8.5 %
Eosinophil #: 0.7 10*3/uL (ref 0.0–0.7)
HCT: 26.6 % — AB (ref 40.0–52.0)
HGB: 8.5 g/dL — ABNORMAL LOW (ref 13.0–18.0)
Lymphocyte #: 1.5 10*3/uL (ref 1.0–3.6)
Lymphocyte %: 18.6 %
MCH: 26.3 pg (ref 26.0–34.0)
MCHC: 32 g/dL (ref 32.0–36.0)
MCV: 82 fL (ref 80–100)
Monocyte #: 1 x10 3/mm (ref 0.2–1.0)
Monocyte %: 12.5 %
Neutrophil #: 4.8 10*3/uL (ref 1.4–6.5)
Neutrophil %: 59.9 %
Platelet: 411 10*3/uL (ref 150–440)
RBC: 3.23 10*6/uL — ABNORMAL LOW (ref 4.40–5.90)
RDW: 20.6 % — AB (ref 11.5–14.5)
WBC: 8.1 10*3/uL (ref 3.8–10.6)

## 2014-06-28 LAB — PROTIME-INR
INR: 1.2
Prothrombin Time: 15.2 secs — ABNORMAL HIGH (ref 11.5–14.7)

## 2014-06-28 LAB — COMPREHENSIVE METABOLIC PANEL
ALK PHOS: 86 U/L
ANION GAP: 7 (ref 7–16)
Albumin: 2.7 g/dL — ABNORMAL LOW (ref 3.4–5.0)
BILIRUBIN TOTAL: 0.6 mg/dL (ref 0.2–1.0)
BUN: 71 mg/dL — AB (ref 7–18)
Calcium, Total: 8.2 mg/dL — ABNORMAL LOW (ref 8.5–10.1)
Chloride: 120 mmol/L — ABNORMAL HIGH (ref 98–107)
Co2: 14 mmol/L — ABNORMAL LOW (ref 21–32)
Creatinine: 7.77 mg/dL — ABNORMAL HIGH (ref 0.60–1.30)
GFR CALC AF AMER: 9 — AB
GFR CALC NON AF AMER: 7 — AB
Glucose: 92 mg/dL (ref 65–99)
OSMOLALITY: 302 (ref 275–301)
POTASSIUM: 4.7 mmol/L (ref 3.5–5.1)
SGOT(AST): 10 U/L — ABNORMAL LOW (ref 15–37)
SGPT (ALT): 6 U/L — ABNORMAL LOW
SODIUM: 141 mmol/L (ref 136–145)
Total Protein: 7.2 g/dL (ref 6.4–8.2)

## 2014-06-29 LAB — CBC WITH DIFFERENTIAL/PLATELET
BASOS PCT: 0.6 %
Basophil #: 0.1 10*3/uL (ref 0.0–0.1)
Basophil #: 0.1 10*3/uL (ref 0.0–0.1)
Basophil %: 0.8 %
EOS ABS: 0.9 10*3/uL — AB (ref 0.0–0.7)
EOS ABS: 0.5 10*3/uL (ref 0.0–0.7)
EOS PCT: 11.4 %
Eosinophil %: 6 %
HCT: 23.7 % — ABNORMAL LOW (ref 40.0–52.0)
HCT: 24.3 % — ABNORMAL LOW (ref 40.0–52.0)
HGB: 7.6 g/dL — ABNORMAL LOW (ref 13.0–18.0)
HGB: 7.5 g/dL — ABNORMAL LOW (ref 13.0–18.0)
LYMPHS PCT: 22.8 %
LYMPHS PCT: 14.9 %
Lymphocyte #: 1.8 10*3/uL (ref 1.0–3.6)
Lymphocyte #: 1.4 10*3/uL (ref 1.0–3.6)
MCH: 26.6 pg (ref 26.0–34.0)
MCH: 25.9 pg — ABNORMAL LOW (ref 26.0–34.0)
MCHC: 32 g/dL (ref 32.0–36.0)
MCHC: 31 g/dL — ABNORMAL LOW (ref 32.0–36.0)
MCV: 83 fL (ref 80–100)
MCV: 84 fL (ref 80–100)
Monocyte #: 1.2 x10 3/mm — ABNORMAL HIGH (ref 0.2–1.0)
Monocyte #: 1.2 x10 3/mm — ABNORMAL HIGH (ref 0.2–1.0)
Monocyte %: 14.9 %
Monocyte %: 13.8 %
NEUTROS ABS: 3.9 10*3/uL (ref 1.4–6.5)
NEUTROS PCT: 50.1 %
NEUTROS PCT: 64.7 %
Neutrophil #: 5.9 10*3/uL (ref 1.4–6.5)
PLATELETS: 377 10*3/uL (ref 150–440)
PLATELETS: 364 10*3/uL (ref 150–440)
RBC: 2.85 10*6/uL — ABNORMAL LOW (ref 4.40–5.90)
RBC: 2.9 10*6/uL — ABNORMAL LOW (ref 4.40–5.90)
RDW: 20.5 % — ABNORMAL HIGH (ref 11.5–14.5)
RDW: 20.5 % — ABNORMAL HIGH (ref 11.5–14.5)
WBC: 7.9 10*3/uL (ref 3.8–10.6)
WBC: 9.1 10*3/uL (ref 3.8–10.6)

## 2014-06-29 LAB — BASIC METABOLIC PANEL
ANION GAP: 8 (ref 7–16)
BUN: 65 mg/dL — ABNORMAL HIGH (ref 7–18)
CHLORIDE: 120 mmol/L — AB (ref 98–107)
CO2: 13 mmol/L — AB (ref 21–32)
CREATININE: 7.87 mg/dL — AB (ref 0.60–1.30)
Calcium, Total: 8.3 mg/dL — ABNORMAL LOW (ref 8.5–10.1)
GFR CALC AF AMER: 9 — AB
GFR CALC NON AF AMER: 7 — AB
GLUCOSE: 81 mg/dL (ref 65–99)
OSMOLALITY: 299 (ref 275–301)
Potassium: 5.1 mmol/L (ref 3.5–5.1)
SODIUM: 141 mmol/L (ref 136–145)

## 2014-06-29 LAB — RENAL FUNCTION PANEL
ANION GAP: 10 (ref 7–16)
Albumin: 2.4 g/dL — ABNORMAL LOW (ref 3.4–5.0)
BUN: 67 mg/dL — AB (ref 7–18)
CREATININE: 8.22 mg/dL — AB (ref 0.60–1.30)
Calcium, Total: 8.1 mg/dL — ABNORMAL LOW (ref 8.5–10.1)
Chloride: 121 mmol/L — ABNORMAL HIGH (ref 98–107)
Co2: 14 mmol/L — ABNORMAL LOW (ref 21–32)
EGFR (Non-African Amer.): 7 — ABNORMAL LOW
GFR CALC AF AMER: 8 — AB
Glucose: 107 mg/dL — ABNORMAL HIGH (ref 65–99)
Osmolality: 309 (ref 275–301)
PHOSPHORUS: 5.8 mg/dL — AB (ref 2.5–4.9)
POTASSIUM: 4.9 mmol/L (ref 3.5–5.1)
SODIUM: 145 mmol/L (ref 136–145)

## 2014-06-29 LAB — LIPID PANEL
Cholesterol: 87 mg/dL (ref 0–200)
HDL: 26 mg/dL — AB (ref 40–60)
LDL CHOLESTEROL, CALC: 43 mg/dL (ref 0–100)
TRIGLYCERIDES: 88 mg/dL (ref 0–200)
VLDL Cholesterol, Calc: 18 mg/dL (ref 5–40)

## 2014-06-29 LAB — MAGNESIUM: Magnesium: 1.6 mg/dL — ABNORMAL LOW

## 2014-06-30 LAB — CBC WITH DIFFERENTIAL/PLATELET
Basophil #: 0.1 10*3/uL (ref 0.0–0.1)
Basophil #: 0.1 10*3/uL (ref 0.0–0.1)
Basophil %: 0.8 %
Basophil %: 0.9 %
EOS ABS: 0.6 10*3/uL (ref 0.0–0.7)
EOS PCT: 6.6 %
Eosinophil #: 0.7 10*3/uL (ref 0.0–0.7)
Eosinophil %: 7.2 %
HCT: 23.1 % — ABNORMAL LOW (ref 40.0–52.0)
HCT: 23.6 % — ABNORMAL LOW (ref 40.0–52.0)
HGB: 7.2 g/dL — AB (ref 13.0–18.0)
HGB: 7.5 g/dL — AB (ref 13.0–18.0)
LYMPHS ABS: 1.7 10*3/uL (ref 1.0–3.6)
Lymphocyte #: 1.9 10*3/uL (ref 1.0–3.6)
Lymphocyte %: 21.1 %
Lymphocyte %: 17.3 %
MCH: 26.1 pg (ref 26.0–34.0)
MCH: 26.2 pg (ref 26.0–34.0)
MCHC: 31.2 g/dL — AB (ref 32.0–36.0)
MCHC: 31.9 g/dL — ABNORMAL LOW (ref 32.0–36.0)
MCV: 84 fL (ref 80–100)
MCV: 82 fL (ref 80–100)
MONO ABS: 1.4 x10 3/mm — AB (ref 0.2–1.0)
MONOS PCT: 15.1 %
MONOS PCT: 12.3 %
Monocyte #: 1.2 x10 3/mm — ABNORMAL HIGH (ref 0.2–1.0)
NEUTROS PCT: 55.8 %
NEUTROS PCT: 62.9 %
Neutrophil #: 5.2 10*3/uL (ref 1.4–6.5)
Neutrophil #: 6.1 10*3/uL (ref 1.4–6.5)
PLATELETS: 293 10*3/uL (ref 150–440)
Platelet: 328 10*3/uL (ref 150–440)
RBC: 2.76 10*6/uL — AB (ref 4.40–5.90)
RBC: 2.87 10*6/uL — ABNORMAL LOW (ref 4.40–5.90)
RDW: 20.4 % — AB (ref 11.5–14.5)
RDW: 20.2 % — AB (ref 11.5–14.5)
WBC: 9.2 10*3/uL (ref 3.8–10.6)
WBC: 9.7 10*3/uL (ref 3.8–10.6)

## 2014-06-30 LAB — BASIC METABOLIC PANEL
Anion Gap: 11 (ref 7–16)
BUN: 65 mg/dL — ABNORMAL HIGH (ref 7–18)
CALCIUM: 8.2 mg/dL — AB (ref 8.5–10.1)
CHLORIDE: 114 mmol/L — AB (ref 98–107)
CO2: 19 mmol/L — AB (ref 21–32)
CREATININE: 7.92 mg/dL — AB (ref 0.60–1.30)
EGFR (African American): 9 — ABNORMAL LOW
EGFR (Non-African Amer.): 7 — ABNORMAL LOW
GLUCOSE: 91 mg/dL (ref 65–99)
Osmolality: 305 (ref 275–301)
Potassium: 4.6 mmol/L (ref 3.5–5.1)
Sodium: 144 mmol/L (ref 136–145)

## 2014-07-01 LAB — RENAL FUNCTION PANEL
ALBUMIN: 2.2 g/dL — AB (ref 3.4–5.0)
Anion Gap: 7 (ref 7–16)
BUN: 55 mg/dL — ABNORMAL HIGH (ref 7–18)
CHLORIDE: 109 mmol/L — AB (ref 98–107)
Calcium, Total: 8.1 mg/dL — ABNORMAL LOW (ref 8.5–10.1)
Co2: 25 mmol/L (ref 21–32)
Creatinine: 7.43 mg/dL — ABNORMAL HIGH (ref 0.60–1.30)
EGFR (African American): 9 — ABNORMAL LOW
EGFR (Non-African Amer.): 8 — ABNORMAL LOW
Glucose: 99 mg/dL (ref 65–99)
Osmolality: 296 (ref 275–301)
Phosphorus: 4.7 mg/dL (ref 2.5–4.9)
Potassium: 4.3 mmol/L (ref 3.5–5.1)
SODIUM: 141 mmol/L (ref 136–145)

## 2014-07-01 LAB — CBC WITH DIFFERENTIAL/PLATELET
BASOS ABS: 0.1 10*3/uL (ref 0.0–0.1)
Basophil %: 0.5 %
Eosinophil #: 0.4 10*3/uL (ref 0.0–0.7)
Eosinophil %: 3.6 %
HCT: 20.9 % — ABNORMAL LOW (ref 40.0–52.0)
HGB: 6.7 g/dL — ABNORMAL LOW (ref 13.0–18.0)
LYMPHS PCT: 14.3 %
Lymphocyte #: 1.5 10*3/uL (ref 1.0–3.6)
MCH: 26.5 pg (ref 26.0–34.0)
MCHC: 32 g/dL (ref 32.0–36.0)
MCV: 83 fL (ref 80–100)
MONO ABS: 1.1 x10 3/mm — AB (ref 0.2–1.0)
Monocyte %: 10.7 %
NEUTROS PCT: 70.9 %
Neutrophil #: 7.5 10*3/uL — ABNORMAL HIGH (ref 1.4–6.5)
PLATELETS: 225 10*3/uL (ref 150–440)
RBC: 2.53 10*6/uL — ABNORMAL LOW (ref 4.40–5.90)
RDW: 20.3 % — AB (ref 11.5–14.5)
WBC: 10.5 10*3/uL (ref 3.8–10.6)

## 2014-07-02 DIAGNOSIS — N19 Unspecified kidney failure: Secondary | ICD-10-CM

## 2014-07-02 DIAGNOSIS — R4182 Altered mental status, unspecified: Secondary | ICD-10-CM

## 2014-07-02 DIAGNOSIS — R0602 Shortness of breath: Secondary | ICD-10-CM

## 2014-07-02 LAB — CBC WITH DIFFERENTIAL/PLATELET
BASOS ABS: 0.1 10*3/uL (ref 0.0–0.1)
BASOS PCT: 0.4 %
Basophil #: 0.1 10*3/uL (ref 0.0–0.1)
Basophil #: 0 10*3/uL (ref 0.0–0.1)
Basophil %: 0.8 %
Basophil %: 0.3 %
EOS ABS: 0.3 10*3/uL (ref 0.0–0.7)
EOS ABS: 0.2 10*3/uL (ref 0.0–0.7)
EOS PCT: 2.2 %
EOS PCT: 2.7 %
EOS PCT: 0.9 %
Eosinophil #: 0.3 10*3/uL (ref 0.0–0.7)
HCT: 25.1 % — ABNORMAL LOW (ref 40.0–52.0)
HCT: 24.8 % — AB (ref 40.0–52.0)
HCT: 28 % — AB (ref 40.0–52.0)
HGB: 8 g/dL — AB (ref 13.0–18.0)
HGB: 7.8 g/dL — AB (ref 13.0–18.0)
HGB: 8.9 g/dL — ABNORMAL LOW (ref 13.0–18.0)
LYMPHS PCT: 13.2 %
Lymphocyte #: 2.1 10*3/uL (ref 1.0–3.6)
Lymphocyte #: 1.6 10*3/uL (ref 1.0–3.6)
Lymphocyte #: 1.7 10*3/uL (ref 1.0–3.6)
Lymphocyte %: 16.2 %
Lymphocyte %: 10.1 %
MCH: 26.1 pg (ref 26.0–34.0)
MCH: 25.9 pg — ABNORMAL LOW (ref 26.0–34.0)
MCH: 26 pg (ref 26.0–34.0)
MCHC: 31.7 g/dL — AB (ref 32.0–36.0)
MCHC: 31.3 g/dL — ABNORMAL LOW (ref 32.0–36.0)
MCHC: 31.6 g/dL — ABNORMAL LOW (ref 32.0–36.0)
MCV: 83 fL (ref 80–100)
MCV: 83 fL (ref 80–100)
MCV: 82 fL (ref 80–100)
MONO ABS: 1.9 x10 3/mm — AB (ref 0.2–1.0)
MONOS PCT: 9.9 %
Monocyte #: 1.2 x10 3/mm — ABNORMAL HIGH (ref 0.2–1.0)
Monocyte #: 1.6 x10 3/mm — ABNORMAL HIGH (ref 0.2–1.0)
Monocyte %: 14.6 %
Monocyte %: 10 %
NEUTROS ABS: 9.1 10*3/uL — AB (ref 1.4–6.5)
NEUTROS ABS: 12.9 10*3/uL — AB (ref 1.4–6.5)
Neutrophil #: 8.7 10*3/uL — ABNORMAL HIGH (ref 1.4–6.5)
Neutrophil %: 66.2 %
Neutrophil %: 73.9 %
Neutrophil %: 78.6 %
PLATELETS: 207 10*3/uL (ref 150–440)
Platelet: 222 10*3/uL (ref 150–440)
Platelet: 228 10*3/uL (ref 150–440)
RBC: 3.04 10*6/uL — AB (ref 4.40–5.90)
RBC: 3.01 10*6/uL — ABNORMAL LOW (ref 4.40–5.90)
RBC: 3.41 10*6/uL — AB (ref 4.40–5.90)
RDW: 18.5 % — AB (ref 11.5–14.5)
RDW: 19.4 % — ABNORMAL HIGH (ref 11.5–14.5)
RDW: 18.5 % — ABNORMAL HIGH (ref 11.5–14.5)
WBC: 13.1 10*3/uL — AB (ref 3.8–10.6)
WBC: 12.3 10*3/uL — ABNORMAL HIGH (ref 3.8–10.6)
WBC: 16.4 10*3/uL — ABNORMAL HIGH (ref 3.8–10.6)

## 2014-07-02 LAB — RENAL FUNCTION PANEL
ANION GAP: 7 (ref 7–16)
Albumin: 2.2 g/dL — ABNORMAL LOW (ref 3.4–5.0)
Albumin: 2.2 g/dL — ABNORMAL LOW (ref 3.4–5.0)
Anion Gap: 9 (ref 7–16)
BUN: 34 mg/dL — ABNORMAL HIGH (ref 7–18)
BUN: 34 mg/dL — AB (ref 7–18)
CHLORIDE: 106 mmol/L (ref 98–107)
Calcium, Total: 7.9 mg/dL — ABNORMAL LOW (ref 8.5–10.1)
Calcium, Total: 8 mg/dL — ABNORMAL LOW (ref 8.5–10.1)
Chloride: 106 mmol/L (ref 98–107)
Co2: 29 mmol/L (ref 21–32)
Co2: 29 mmol/L (ref 21–32)
Creatinine: 5.53 mg/dL — ABNORMAL HIGH (ref 0.60–1.30)
Creatinine: 5.76 mg/dL — ABNORMAL HIGH (ref 0.60–1.30)
EGFR (African American): 13 — ABNORMAL LOW
EGFR (Non-African Amer.): 10 — ABNORMAL LOW
GFR CALC AF AMER: 13 — AB
GFR CALC NON AF AMER: 11 — AB
GLUCOSE: 162 mg/dL — AB (ref 65–99)
Glucose: 106 mg/dL — ABNORMAL HIGH (ref 65–99)
Osmolality: 291 (ref 275–301)
Osmolality: 298 (ref 275–301)
PHOSPHORUS: 3.3 mg/dL (ref 2.5–4.9)
PHOSPHORUS: 3 mg/dL (ref 2.5–4.9)
POTASSIUM: 4.1 mmol/L (ref 3.5–5.1)
Potassium: 3.8 mmol/L (ref 3.5–5.1)
SODIUM: 142 mmol/L (ref 136–145)
Sodium: 144 mmol/L (ref 136–145)

## 2014-07-03 ENCOUNTER — Ambulatory Visit: Payer: Self-pay | Admitting: Orthopedic Surgery

## 2014-07-03 LAB — URINALYSIS, COMPLETE
BILIRUBIN, UR: NEGATIVE
GLUCOSE, UR: NEGATIVE mg/dL (ref 0–75)
KETONE: NEGATIVE
Nitrite: NEGATIVE
Ph: 7 (ref 4.5–8.0)
Protein: 100
Specific Gravity: 1.01 (ref 1.003–1.030)
Squamous Epithelial: NONE SEEN

## 2014-07-03 LAB — CBC WITH DIFFERENTIAL/PLATELET
BASOS ABS: 0.1 10*3/uL (ref 0.0–0.1)
Basophil %: 0.9 %
EOS ABS: 0.5 10*3/uL (ref 0.0–0.7)
EOS PCT: 3.7 %
HCT: 27.5 % — AB (ref 40.0–52.0)
HGB: 8.5 g/dL — ABNORMAL LOW (ref 13.0–18.0)
LYMPHS PCT: 22.1 %
Lymphocyte #: 2.9 10*3/uL (ref 1.0–3.6)
MCH: 25.9 pg — AB (ref 26.0–34.0)
MCHC: 30.9 g/dL — ABNORMAL LOW (ref 32.0–36.0)
MCV: 84 fL (ref 80–100)
MONO ABS: 1.8 x10 3/mm — AB (ref 0.2–1.0)
MONOS PCT: 13.7 %
Neutrophil #: 7.7 10*3/uL — ABNORMAL HIGH (ref 1.4–6.5)
Neutrophil %: 59.6 %
PLATELETS: 236 10*3/uL (ref 150–440)
RBC: 3.27 10*6/uL — AB (ref 4.40–5.90)
RDW: 18.9 % — ABNORMAL HIGH (ref 11.5–14.5)
WBC: 12.9 10*3/uL — ABNORMAL HIGH (ref 3.8–10.6)

## 2014-07-03 LAB — HEMOGLOBIN: HGB: 8.7 g/dL — AB (ref 13.0–18.0)

## 2014-07-03 LAB — BASIC METABOLIC PANEL
ANION GAP: 10 (ref 7–16)
BUN: 30 mg/dL — ABNORMAL HIGH (ref 7–18)
Calcium, Total: 8.4 mg/dL — ABNORMAL LOW (ref 8.5–10.1)
Chloride: 102 mmol/L (ref 98–107)
Co2: 30 mmol/L (ref 21–32)
Creatinine: 5.66 mg/dL — ABNORMAL HIGH (ref 0.60–1.30)
EGFR (African American): 13 — ABNORMAL LOW
GFR CALC NON AF AMER: 11 — AB
Glucose: 124 mg/dL — ABNORMAL HIGH (ref 65–99)
Osmolality: 291 (ref 275–301)
Potassium: 3.9 mmol/L (ref 3.5–5.1)
SODIUM: 142 mmol/L (ref 136–145)

## 2014-07-03 LAB — MAGNESIUM: MAGNESIUM: 1.7 mg/dL — AB

## 2014-07-04 LAB — CBC WITH DIFFERENTIAL/PLATELET
BASOS ABS: 0.1 10*3/uL (ref 0.0–0.1)
BASOS PCT: 0.5 %
Eosinophil #: 0.7 10*3/uL (ref 0.0–0.7)
Eosinophil %: 5.1 %
HCT: 27.2 % — ABNORMAL LOW (ref 40.0–52.0)
HGB: 8.7 g/dL — AB (ref 13.0–18.0)
Lymphocyte #: 2.6 10*3/uL (ref 1.0–3.6)
Lymphocyte %: 19.4 %
MCH: 26.5 pg (ref 26.0–34.0)
MCHC: 31.8 g/dL — AB (ref 32.0–36.0)
MCV: 83 fL (ref 80–100)
MONO ABS: 1.8 x10 3/mm — AB (ref 0.2–1.0)
MONOS PCT: 13.8 %
NEUTROS ABS: 8.2 10*3/uL — AB (ref 1.4–6.5)
NEUTROS PCT: 61.2 %
Platelet: 249 10*3/uL (ref 150–440)
RBC: 3.27 10*6/uL — ABNORMAL LOW (ref 4.40–5.90)
RDW: 18.6 % — ABNORMAL HIGH (ref 11.5–14.5)
WBC: 13.3 10*3/uL — AB (ref 3.8–10.6)

## 2014-07-04 LAB — COMPREHENSIVE METABOLIC PANEL
ALBUMIN: 2.4 g/dL — AB (ref 3.4–5.0)
Alkaline Phosphatase: 139 U/L — ABNORMAL HIGH
Anion Gap: 10 (ref 7–16)
BUN: 35 mg/dL — ABNORMAL HIGH (ref 7–18)
Bilirubin,Total: 0.3 mg/dL (ref 0.2–1.0)
CREATININE: 6.57 mg/dL — AB (ref 0.60–1.30)
Calcium, Total: 8.4 mg/dL — ABNORMAL LOW (ref 8.5–10.1)
Chloride: 102 mmol/L (ref 98–107)
Co2: 29 mmol/L (ref 21–32)
EGFR (African American): 11 — ABNORMAL LOW
EGFR (Non-African Amer.): 9 — ABNORMAL LOW
GLUCOSE: 103 mg/dL — AB (ref 65–99)
Osmolality: 289 (ref 275–301)
POTASSIUM: 3.7 mmol/L (ref 3.5–5.1)
SGOT(AST): 19 U/L (ref 15–37)
SGPT (ALT): 10 U/L — ABNORMAL LOW
SODIUM: 141 mmol/L (ref 136–145)
TOTAL PROTEIN: 6.5 g/dL (ref 6.4–8.2)

## 2014-07-05 LAB — CBC WITH DIFFERENTIAL/PLATELET
Basophil #: 0.1 10*3/uL (ref 0.0–0.1)
Basophil %: 0.4 %
EOS PCT: 6.5 %
Eosinophil #: 0.9 10*3/uL — ABNORMAL HIGH (ref 0.0–0.7)
HCT: 26.9 % — ABNORMAL LOW (ref 40.0–52.0)
HGB: 8.3 g/dL — ABNORMAL LOW (ref 13.0–18.0)
Lymphocyte #: 2.4 10*3/uL (ref 1.0–3.6)
Lymphocyte %: 17.1 %
MCH: 26.1 pg (ref 26.0–34.0)
MCHC: 30.6 g/dL — ABNORMAL LOW (ref 32.0–36.0)
MCV: 85 fL (ref 80–100)
MONO ABS: 1.6 x10 3/mm — AB (ref 0.2–1.0)
Monocyte %: 11.4 %
Neutrophil #: 9.1 10*3/uL — ABNORMAL HIGH (ref 1.4–6.5)
Neutrophil %: 64.6 %
PLATELETS: 235 10*3/uL (ref 150–440)
RBC: 3.16 10*6/uL — ABNORMAL LOW (ref 4.40–5.90)
RDW: 19 % — ABNORMAL HIGH (ref 11.5–14.5)
WBC: 14.2 10*3/uL — AB (ref 3.8–10.6)

## 2014-07-05 LAB — URINE CULTURE

## 2014-07-05 LAB — RENAL FUNCTION PANEL
ALBUMIN: 2.5 g/dL — AB (ref 3.4–5.0)
Anion Gap: 11 (ref 7–16)
BUN: 55 mg/dL — AB (ref 7–18)
CALCIUM: 8.7 mg/dL (ref 8.5–10.1)
Chloride: 99 mmol/L (ref 98–107)
Co2: 29 mmol/L (ref 21–32)
Creatinine: 8.44 mg/dL — ABNORMAL HIGH (ref 0.60–1.30)
GFR CALC AF AMER: 8 — AB
GFR CALC NON AF AMER: 7 — AB
Glucose: 97 mg/dL (ref 65–99)
OSMOLALITY: 293 (ref 275–301)
PHOSPHORUS: 5.8 mg/dL — AB (ref 2.5–4.9)
Potassium: 4 mmol/L (ref 3.5–5.1)
SODIUM: 139 mmol/L (ref 136–145)

## 2014-07-05 LAB — GLUCOSE, RANDOM: Glucose: 105 mg/dL — ABNORMAL HIGH (ref 65–99)

## 2014-07-05 LAB — PHOSPHORUS: PHOSPHORUS: 5.7 mg/dL — AB (ref 2.5–4.9)

## 2014-07-06 LAB — URINALYSIS, COMPLETE
BACTERIA: NONE SEEN
Bilirubin,UR: NEGATIVE
Glucose,UR: NEGATIVE mg/dL (ref 0–75)
KETONE: NEGATIVE
Nitrite: NEGATIVE
Ph: 8 (ref 4.5–8.0)
Protein: 100
RBC,UR: 60 /HPF (ref 0–5)
Specific Gravity: 1.01 (ref 1.003–1.030)
Squamous Epithelial: NONE SEEN

## 2014-07-06 LAB — RENAL FUNCTION PANEL
ALBUMIN: 2.4 g/dL — AB (ref 3.4–5.0)
Anion Gap: 8 (ref 7–16)
BUN: 36 mg/dL — AB (ref 7–18)
CO2: 32 mmol/L (ref 21–32)
Calcium, Total: 8.6 mg/dL (ref 8.5–10.1)
Chloride: 100 mmol/L (ref 98–107)
Creatinine: 6.56 mg/dL — ABNORMAL HIGH (ref 0.60–1.30)
EGFR (African American): 11 — ABNORMAL LOW
EGFR (Non-African Amer.): 9 — ABNORMAL LOW
Glucose: 96 mg/dL (ref 65–99)
Osmolality: 288 (ref 275–301)
POTASSIUM: 4.2 mmol/L (ref 3.5–5.1)
Phosphorus: 3.5 mg/dL (ref 2.5–4.9)
Sodium: 140 mmol/L (ref 136–145)

## 2014-07-06 LAB — CBC WITH DIFFERENTIAL/PLATELET
BASOS ABS: 0.1 10*3/uL (ref 0.0–0.1)
Basophil %: 0.5 %
Eosinophil #: 0.8 10*3/uL — ABNORMAL HIGH (ref 0.0–0.7)
Eosinophil %: 5.7 %
HCT: 26.5 % — ABNORMAL LOW (ref 40.0–52.0)
HGB: 8 g/dL — ABNORMAL LOW (ref 13.0–18.0)
LYMPHS PCT: 18.7 %
Lymphocyte #: 2.8 10*3/uL (ref 1.0–3.6)
MCH: 25.9 pg — AB (ref 26.0–34.0)
MCHC: 30.1 g/dL — AB (ref 32.0–36.0)
MCV: 86 fL (ref 80–100)
MONOS PCT: 12.8 %
Monocyte #: 1.9 x10 3/mm — ABNORMAL HIGH (ref 0.2–1.0)
Neutrophil #: 9.2 10*3/uL — ABNORMAL HIGH (ref 1.4–6.5)
Neutrophil %: 62.3 %
Platelet: 214 10*3/uL (ref 150–440)
RBC: 3.09 10*6/uL — AB (ref 4.40–5.90)
RDW: 18.8 % — ABNORMAL HIGH (ref 11.5–14.5)
WBC: 14.8 10*3/uL — AB (ref 3.8–10.6)

## 2014-07-07 ENCOUNTER — Emergency Department: Payer: Self-pay | Admitting: Emergency Medicine

## 2014-07-07 LAB — TROPONIN I: Troponin-I: <0.02 ng/mL

## 2014-07-07 LAB — BASIC METABOLIC PANEL
ANION GAP: 9 (ref 7–16)
BUN: 39 mg/dL — ABNORMAL HIGH (ref 7–18)
CO2: 29 mmol/L (ref 21–32)
Calcium, Total: 8.4 mg/dL — ABNORMAL LOW (ref 8.5–10.1)
Chloride: 103 mmol/L (ref 98–107)
Creatinine: 7.35 mg/dL — ABNORMAL HIGH (ref 0.60–1.30)
EGFR (Non-African Amer.): 8 — ABNORMAL LOW
GFR CALC AF AMER: 9 — AB
Glucose: 123 mg/dL — ABNORMAL HIGH (ref 65–99)
Osmolality: 292 (ref 275–301)
POTASSIUM: 4 mmol/L (ref 3.5–5.1)
SODIUM: 141 mmol/L (ref 136–145)

## 2014-07-07 LAB — CK TOTAL AND CKMB (NOT AT ARMC)
CK, Total: 124 U/L
CK-MB: 1 ng/mL (ref 0.5–3.6)

## 2014-07-07 LAB — CBC
HCT: 26.7 % — ABNORMAL LOW (ref 40.0–52.0)
HGB: 8.1 g/dL — AB (ref 13.0–18.0)
MCH: 26.1 pg (ref 26.0–34.0)
MCHC: 30.2 g/dL — AB (ref 32.0–36.0)
MCV: 86 fL (ref 80–100)
Platelet: 264 10*3/uL (ref 150–440)
RBC: 3.09 10*6/uL — ABNORMAL LOW (ref 4.40–5.90)
RDW: 19.4 % — ABNORMAL HIGH (ref 11.5–14.5)
WBC: 15.1 10*3/uL — ABNORMAL HIGH (ref 3.8–10.6)

## 2014-07-07 LAB — CULTURE, BLOOD (SINGLE)

## 2014-07-07 LAB — PRO B NATRIURETIC PEPTIDE: B-Type Natriuretic Peptide: 1793 pg/mL — ABNORMAL HIGH (ref 0–125)

## 2014-07-08 ENCOUNTER — Emergency Department: Payer: Self-pay | Admitting: Emergency Medicine

## 2014-07-08 LAB — URINE CULTURE

## 2014-07-08 LAB — CK TOTAL AND CKMB (NOT AT ARMC)
CK, TOTAL: 108 U/L
CK-MB: 0.6 ng/mL (ref 0.5–3.6)

## 2014-07-08 LAB — CBC
HCT: 27.7 % — ABNORMAL LOW (ref 40.0–52.0)
HGB: 8.8 g/dL — AB (ref 13.0–18.0)
MCH: 26.7 pg (ref 26.0–34.0)
MCHC: 31.6 g/dL — AB (ref 32.0–36.0)
MCV: 85 fL (ref 80–100)
Platelet: 325 10*3/uL (ref 150–440)
RBC: 3.28 10*6/uL — ABNORMAL LOW (ref 4.40–5.90)
RDW: 19.1 % — AB (ref 11.5–14.5)
WBC: 17.2 10*3/uL — ABNORMAL HIGH (ref 3.8–10.6)

## 2014-07-08 LAB — BASIC METABOLIC PANEL
Anion Gap: 10 (ref 7–16)
BUN: 50 mg/dL — AB (ref 7–18)
CREATININE: 9.18 mg/dL — AB (ref 0.60–1.30)
Calcium, Total: 9.3 mg/dL (ref 8.5–10.1)
Chloride: 103 mmol/L (ref 98–107)
Co2: 26 mmol/L (ref 21–32)
EGFR (Non-African Amer.): 6 — ABNORMAL LOW
GFR CALC AF AMER: 7 — AB
GLUCOSE: 102 mg/dL — AB (ref 65–99)
Osmolality: 291 (ref 275–301)
POTASSIUM: 4.5 mmol/L (ref 3.5–5.1)
Sodium: 139 mmol/L (ref 136–145)

## 2014-07-08 LAB — TROPONIN I

## 2014-07-08 LAB — CULTURE, BLOOD (SINGLE)

## 2014-08-17 ENCOUNTER — Ambulatory Visit: Payer: Self-pay | Admitting: Vascular Surgery

## 2014-08-17 LAB — BASIC METABOLIC PANEL
ANION GAP: 6 — AB (ref 7–16)
BUN: 24 mg/dL — AB (ref 7–18)
Calcium, Total: 8.3 mg/dL — ABNORMAL LOW (ref 8.5–10.1)
Chloride: 101 mmol/L (ref 98–107)
Co2: 34 mmol/L — ABNORMAL HIGH (ref 21–32)
Creatinine: 4.32 mg/dL — ABNORMAL HIGH (ref 0.60–1.30)
EGFR (African American): 17 — ABNORMAL LOW
EGFR (Non-African Amer.): 14 — ABNORMAL LOW
Glucose: 103 mg/dL — ABNORMAL HIGH (ref 65–99)
OSMOLALITY: 286 (ref 275–301)
POTASSIUM: 3.3 mmol/L — AB (ref 3.5–5.1)
Sodium: 141 mmol/L (ref 136–145)

## 2014-08-17 LAB — CBC WITH DIFFERENTIAL/PLATELET
BASOS ABS: 0.1 10*3/uL (ref 0.0–0.1)
Basophil %: 0.6 %
EOS ABS: 0.2 10*3/uL (ref 0.0–0.7)
EOS PCT: 1.6 %
HCT: 29.4 % — ABNORMAL LOW (ref 40.0–52.0)
HGB: 8.9 g/dL — AB (ref 13.0–18.0)
Lymphocyte #: 1.8 10*3/uL (ref 1.0–3.6)
Lymphocyte %: 15.2 %
MCH: 26.8 pg (ref 26.0–34.0)
MCHC: 30.4 g/dL — AB (ref 32.0–36.0)
MCV: 88 fL (ref 80–100)
Monocyte #: 1.2 x10 3/mm — ABNORMAL HIGH (ref 0.2–1.0)
Monocyte %: 10.2 %
Neutrophil #: 8.4 10*3/uL — ABNORMAL HIGH (ref 1.4–6.5)
Neutrophil %: 72.4 %
PLATELETS: 269 10*3/uL (ref 150–440)
RBC: 3.32 10*6/uL — ABNORMAL LOW (ref 4.40–5.90)
RDW: 18.1 % — AB (ref 11.5–14.5)
WBC: 11.6 10*3/uL — AB (ref 3.8–10.6)

## 2014-08-22 ENCOUNTER — Ambulatory Visit: Payer: Self-pay | Admitting: Vascular Surgery

## 2014-08-22 LAB — POTASSIUM: Potassium: 4.2 mmol/L (ref 3.5–5.1)

## 2014-11-02 ENCOUNTER — Ambulatory Visit: Payer: Self-pay | Admitting: Vascular Surgery
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
## 2015-01-13 NOTE — Op Note (Signed)
PATIENT NAME:  [name], [name] MR#:  [id number] DATE OF BIRTH:  1941/04/15  DATE OF PROCEDURE:  10/27/2012  PREOPERATIVE DIAGNOSES: Bilateral hydronephrosis, metastatic prostate cancer, bladder tumor with history of bladder cancer, renal failure.   POSTOPERATIVE DIAGNOSIS: Bilateral hydronephrosis, metastatic prostate cancer, bladder tumor with history of bladder cancer, renal failure.   PROCEDURES: Cystoscopy, bilateral stent change, bladder biopsy.   SURGEON: Edrick Oh, MD  ANESTHESIA: Minimal airway anesthesia.   INDICATION: The patient is a 74 year old African American gentleman with a history of transitional cell carcinoma of the bladder. He has areas of abnormal appearing mucosa over a significant portion of the bladder. He also has in bilateral indwelling stents for hydronephrosis related to metastatic prostate cancer. He is due for routine stent change. He presents for this purpose. He does have baseline renal insufficiency with recent worsening that is likely related to some dehydration as well as need for stent change. He presents for this purpose.   DESCRIPTION OF PROCEDURE: After informed consent was obtained, the patient was taken to the operating room and placed in the dorsal lithotomy position under minimal airway anesthesia. The patient was then prepped and draped in the usual standard fashion. The 31 French rigid cystoscope was introduced into the urethra under direct vision with no urethral abnormalities noted. At the level of the external sphincter, it was noted to be relatively open with minimal tone present. The scope was advanced into the prostate area with minimal visual obstruction. Upon entering the bladder stents were noted protruding from the right and left ureteral orifice. They were noted to be somewhat lateral. The right ureteral orifice was wide and dilated. The left ureteral orifice demonstrated mild dilation. This is suspicious for high-pressure bladder with  reflux. Multiple areas of raised, erythematous mucosa and mucus were noted over substantial portions of the bladder. Inflammatory changes were also noted along the bladder base more consistent with irritation related to the stents. A guidewire was advanced into the right ureteral orifice and into the upper pole collecting system without difficulty. The stent was grasped and removed without difficulty. The right stent was a previously placed Resonance stent. The sheath was then advanced over the guidewire. A new 6 French x 26 cm double-J Resonance stent was advanced through the sheath into the right renal pelvis. The sheath was then withdrawn with adequate curl noted within the renal pelvis and adequate curl noted within the urinary bladder. The guidewire was then advanced into the left ureteral orifice. It was advanced into the upper pole collecting system without difficulty. The plastic indwelling stent on the left was grasped and removed without difficulty. The sheath was then advanced over the guidewire. A second 6 French x 26 cm Resonance stent was advanced through the sheath. Adequate curl was noted within the renal pelvis. Adequate curl was also noted within the urinary bladder. There were no problems or complications with stent placement. Cold cup biopsy forceps were then utilized to obtain biopsies from the right posterior lateral wall and left posterior lateral wall. These were the areas demonstrating the most definitive irregular changes. The areas were then cauterized utilizing a Bugbee electrode. No significant bleeding was encountered. The bladder was then drained. The cystoscope was removed. The patient was returned to the supine position and awakened from minimal airway anesthesia. He was taken to the recovery room in stable condition. There were no problems or complications. The patient tolerated the procedure well.  ____________________________ Denice Bors. Jacqlyn Larsen, MD bsc:sb D: 10/27/2012 37:85:88  ET  T: 10/28/2012 07:14:37 ET JOB#: [id number]  cc: Denice Bors. Jacqlyn Larsen, MD, <Dictator> Denice Bors Lodema Parma MD ELECTRONICALLY SIGNED 10/29/2012 7:44

## 2015-01-13 NOTE — Discharge Summary (Signed)
PATIENT NAME:  [name], [name] MR#:  [id number] DATE OF BIRTH:  11-02-1940  DATE OF ADMISSION:  04/13/2013 DATE OF DISCHARGE:    DIAGNOSES AT TIME OF DISCHARGE: 1.  Chronic kidney disease.  2.  Hydronephrosis.  3.  Hypertension.    CHIEF COMPLAINT:  1.  Worsening renal insufficiency.  2.  Possible stent dysfunction.   HISTORY OF PRESENT ILLNESS:  [name] is a 74 year old male with a history of chronic kidney disease, bilateral hydronephrosis status post stent placement with urology, was admitted with concern of dysfunction and impaction of his stents.  There is also question whether he had evidence of urinary tract infection.   PAST MEDICAL HISTORY:  Significant for prostate cancer, bladder cancer, chronic renal insufficiency, borderline diabetes.   PHYSICAL EXAMINATION: VITAL SIGNS:  Temperature was 98, pulse was 72, respirations 16, blood pressure 160/69, O2 sat 96% on room air.  GENERAL:  He was not in distress.  HEENT:  NCAT.  HEART:  S1, S2.  LUNGS:  Clear.  ABDOMEN:  Soft, nontender.  EXTREMITIES:  2+ edema.  NEUROLOGIC:  Nonfocal.   IMAGING STUDIES:  CT scan of the abdomen and pelvis revealed bilateral nephroureteral stent placement, severe right hydronephrosis and left hydronephrosis with proximal portion of the bilateral stents impacted in the calyx.  There is also an aortoiliac stent graft present and aneurysmal sac measuring 4.9 x 4.5 cm.    The patient was seen in consultation by urologist, Dr. Irene Shipper, who did not think that he had evidence of acute renal insufficiency and reportedly during his last hospitalization on 10/12/2012 his serum creatinine was 2.88 and it had only up a fraction to 2.9.  She did not think that there was any dysfunctionality in the stent, although the CT scan report did state that it was impacted in the calyx.  She did not think there was any reason to think that the stents were not draining and it needed to be exchanged only every 6 to  12 months.  She also felt that he did not have any complaints of urinary tract infection and the indwelling stents were probably colonized and recommended that the patient be discharged and be followed up with Dr. Jacqlyn Larsen.  The patient was stable symptomatically.  He was also seen by Dr. Gilford Rile and after discussion with urologist, it was felt that he could be discharged in stable condition on the following medications:  Amlodipine 10 mg by mouth daily and follow up with Dr. Jacqlyn Larsen and also follow up with Dr. Juleen China, nephrologist.   The patient was stable at the time of discharge.    ____________________________ Tracie Harrier, MD vh:ea D: 04/15/2013 19:25:56 ET T: 04/15/2013 23:10:21 ET JOB#: [id number]  cc: Tracie Harrier, MD, <Dictator> Tracie Harrier MD ELECTRONICALLY SIGNED 04/26/2013 17:31

## 2015-01-13 NOTE — Discharge Summary (Signed)
PATIENT NAME:  [name], [name] MR#:  [id number] DATE OF BIRTH:  1941/03/27  DATE OF ADMISSION:  10/19/2012 DATE OF DISCHARGE:  10/22/2012  DISCHARGE DIAGNOSES: 1. Acute on chronic renal failure with bilateral hydronephrosis that is improving.  2. Hypertension.  3. Neurogenic bladder.   DISCHARGE MEDICATION:  Amlodipine 10 mg 1/2 tab p.o. daily.    CONSULTS:  Nephrology per Dr. Candiss Norse.   PERTINENT LABORATORY, DIAGNOSTIC AND RADIOLOGICAL DATA:  On the day of discharge sodium is 143, potassium 3.5, BUN of 27, creatinine 2.8, glucose of 90. White blood cell count 6.5, hemoglobin 9.8, and platelets of 300. C Diff was negative. Urinalysis showed 3+ leukocyte esterase, 2+ blood, white blood cell count 523; however, outside urine culture was negative, per Dr. Jacqlyn Larsen.   Ultrasound showed bilateral moderate hydronephrosis.   BRIEF HOSPITAL COURSE:  1. Acute on chronic renal failure: The patient initially came in with a creatinine of 3.14 with a baseline around 1.9. The creatinine did rise up to 3.43. It showed on a bladder scan that he was retaining a significant amount of urine in the bladder. He has a known history of neurogenic bladder with a history of bladder cancer, and he has been struggling with incontinence per Dr. Jacqlyn Larsen. A Foley catheter was placed which provided instant relief and also improvement in his renal function. Nephrology was consulted. They saw the patient and did not recommend anything further except for the Foley catheter, which will remain in place until he is seen by Dr. Jacqlyn Larsen. I spoke with Dr. Jacqlyn Larsen on the phone, who stated that he has plans to change out the stents which will help with his current condition. That plan is to be completed on Tuesday here at [facility].  2. Hypertension:  Will continue on a lower dose of amlodipine at 5 mg daily.  3. Neurogenic bladder: Plan as above.   The patient is in stable condition and will be discharged to home with nursing care of the Foley. He  will follow up with Dr. Jacqlyn Larsen on Tuesday for the stent change. He is in stable condition. Follow up with Dr. Netty Starring in 2 to 4 weeks as he will remain with a Foley in place.   ____________________________ Dion Body, MD kl:cb D: 10/22/2012 12:17:10 ET T: 10/22/2012 13:26:51 ET JOB#: [id number]  cc: Dion Body, MD, <Dictator> Dion Body MD ELECTRONICALLY SIGNED 11/17/2012 14:21

## 2015-01-13 NOTE — H&P (Signed)
PATIENT NAME:  [name], [name] MR#:  [id number] DATE OF BIRTH:  02/25/1941  DATE OF ADMISSION:  10/19/2012  ATTENDING PHYSICIAN:  Marta Antu.  PRIMARY CARE PHYSICIAN:  Dr. Netty Starring.    UROLOGIST:  Drs. Cope and Stoioff.  CHIEF COMPLAINT:  Diarrhea, generalized weakness.   HISTORY OF PRESENT ILLNESS:  This is a 74 year old male with significant past medical history of hypertension, chronic kidney disease, history of bladder cancer, diabetes controlled with diet, history of prostate cancer, presents with multiple complaints, mainly of feeling weak, nausea, 1 episode of vomiting, diarrhea with cough and shortness of breath, and subjective feeling of fever.  The patient had basic workup done in the ED, where he was found to have worsening renal function, where his baseline creatinine is around 1.9, where it climbed up to 3.4. The patient reports some complaints of inability to void, as well with urinary incontinence. Reports he should be having a procedure soon where he will have bilateral stents replaced. The patient had a bladder scan done in ED, which showed postvoid residual volume of 450. The patient reports subjective feeling of fever, even though he was afebrile in the ED. Reports diarrheal watery episodes. Denies any recent exposure to antibiotics in the past. He had no BM since presentation to ED, as well no vomiting. KUB was ordered for the patient as well as stool workup, but he did not have any bowel movements yet.  The patient denies any chest pain, any palpitations, syncope. Hospitalist service was requested to admit the patient for further management of his worsening renal function and diarrhea.   PAST MEDICAL HISTORY: 1.  Hypertension. 2.  Diabetes , controlled with diet.  3.  On documentation, there is history of CVA.  4.  Chronic kidney disease, baseline creatinine around 2.  5.  History of prostate cancer.  6.  Bladder cancer.   PAST SURGICAL HISTORY: 1.  Status post  bilaterally ureteral stent.  2.  AAA repair.  3.  Coil embolization of left hypogastric artery.  4.  Transurethral resection of bladder tumor.  4.  History of nephrectomy tube placed in the past.   ALLERGIES:  PENICILLIN.   HOME MEDICATIONS: 1.  Metoprolol 50 mg oral 2 times a day.  2.  Norvasc 10 mg oral daily.   SOCIAL HISTORY:  The patient smokes 1 pack per day. No alcohol or drug use. Lives alone. Sometimes son comes to help him at home.   FAMILY HISTORY:  Significant for hypertension and diabetes.   REVIEW OF SYSTEMS:   CONSTITUTIONAL:  Complains of generalized weakness, fatigue, has subjective fever.  EYES:  Denies blurry vision, double vision, pain, inflammation.  EAR, NOSE, THROAT:  Denies tinnitus, ear pain, hearing loss, epistaxis or discharge.  RESPIRATORY: Denies cough, wheezing, hemoptysis, painful respirations or COPD.  He has some complaints of shortness of breath.  CARDIOVASCULAR:  Denies chest pain, edema, palpitations or syncope.  GASTROINTESTINAL:  Has complaints of some nausea and vomiting and diarrhea. Denies constipation, abdominal pain, hematemesis, coffee-ground emesis, rectal bleed or jaundice. GENITOURINARY:  Denies dysuria, hematuria or renal colic. Complains of inability to produce urine with incontinence.  ENDOCRINE:  Denies polyuria, polydipsia, heat or cold intolerance.  HEMATOLOGY:  Denies anemia, easy bruising, bleeding diathesis.  INTEGUMENTARY:  Denies acne, rash or skin lesions.  MUSCULOSKELETAL:  Denies any gout, redness, arthritis or cramps.  NEUROLOGIC:  Denies any ataxia, dementia, headache, epilepsy, tremors, vertigo or seizure.  PSYCHIATRIC:  Denies anxiety, insomnia, bipolar disorder or depression.  PHYSICAL EXAMINATION: VITAL SIGNS:  Temperature 97.4, pulse 52, respiratory rate 18, blood pressure 103/52, saturating 96% on room air.  GENERAL:  Poorly kempt male, is here in bed in no apparent distress.  HEENT:  Head: Atraumatic,  normocephalic. Pupils equal, reactive to light. Pink conjunctivae. Anicteric sclerae. Moist oral mucosa.  NECK:  Supple. No thyromegaly. No JVD.  CHEST:  Good air entry bilaterally. No wheezing, rales, rhonchi.  CARDIOVASCULAR:  S1, S2 heard. No rubs, murmurs or gallops.  ABDOMEN:  Soft, nontender, nondistended. Bowel sounds present.  EXTREMITIES:  No edema. No clubbing. No cyanosis.  PSYCHIATRIC:  Awake, alert, oriented x3. An extremely poor historian.  NEUROLOGIC:  Grossly intact. Motor 5/5. No focal deficits.  SKIN:   Warm and dry. Normal skin turgor.   PERTINENT LABORATORY DATA:  Glucose 104, BUN 33, creatinine 3.43, sodium 141, potassium 3.4, chloride 108, CO2 of 22, anion gap 11. Troponin less than 0.02. Hemoglobin 10.5, hematocrit 32, white blood cell 8, platelets 322.   ASSESSMENT AND PLAN: 1.  Acute on chronic renal failure, this is most likely related to obstructive uropathy, as the patient had 450 mL postvoid residual volume. We will insert Foley catheter. We will start the patient on IV hydration gently, 75 mL/hour.  We will consult nephrology, as well we will consult urology, Dr. Jacqlyn Larsen. We will obtain renal ultrasound.  2.  Diarrhea and vomiting. The patient currently has no further diarrhea or vomiting. This is most likely related to episodes of viral gastroenteritis.  We will monitor closely.  We will continue hydration.  We will place the patient on p.r.n. Zofran for nausea, and we will check stool workup.  At this point, the patient is afebrile, has no leukocytosis, no abdominal tenderness. There is no indication to start the patient on IV antibiotics.  3.  History of bladder cancer.  We will consult urology service.  4.  Hypertension. We will hold the patient's home medications, as his heart rate is borderline, as well, he has diarrhea, so these meds will be resumed when he is more stable.  5.  Diabetes mellitus, controlled by diet.  We will have patient on fingerstick blood  glucose.  6.  Deep vein thrombosis prophylaxis. SubQ heparin, GI prophylaxis, Protonix.   CODE STATUS: FULL CODE.   TOTAL TIME SPENT ON ADMISSION AND PATIENT CARE:  55 minutes.    ____________________________ Albertine Patricia, MD dse:dm D: 10/20/2012 08:39:23 ET T: 10/20/2012 10:13:01 ET JOB#: [id number]  cc: Albertine Patricia, MD, <Dictator> Rei Contee Graciela Husbands MD ELECTRONICALLY SIGNED 10/21/2012 6:32

## 2015-01-13 NOTE — Consult Note (Signed)
Reason for consultation:  Hydronephrosis on CT scan 74 yo male with multiple medical issues including HTN, diet controlled diabetes, h/o prostate cancer, h/o bladder cancer, CVA, ?PE per patient report.  Patient has chronic indwelling bilateral ureteral stents for obstruction 2/2 prostate cancer and is followed by Dr. Jacqlyn Larsen, urology.  The patient was sent to the emergency room last evening by his nephrologist, Dr. Holley Raring because of a recent CT that showed hydronephrosis and because of concern for ARF.patient had no complaints on presentation to the emergency room and was admitted for evaluation. patient was urinating with no problems-denies hematuria and dysuria.  The patient denies nausea, fevers, chills or flank pain.    1.  Hypertension. 2.  Diabetes , controlled with diet.  On documentation, there is history of CVA.  Chronic kidney disease  History of prostate cancer.  Bladder cancer.   Status post bilateral ureteral stent, most recent 10/27/12.  AAA repair.  Coil embolization of left hypogastric artery.  Transurethral resection of bladder tumor.  History of nephrostomy tubes   History:patient lives at home, his son lives with him.  Able to do ADLs  ROS: Denies any fever, fatigue, weakness, chronic right lower extremity weakness is present.  Denies any blurred vision, glaucoma, cataracts. NOSE, THROAT:  Denies any epistaxis, discharge, postnasal drip.  Denies any cough, COPD, hemoptysis or dyspnea.  No chest pain, palpitations, syncope.  Denies any nausea, vomiting, diarrhea, GERD. +constipation  No dysuria, hematuria.   Denies any polyuria, nocturia or thyroid problems. AND LYMPHATIC:  Denies any anemia, easy bruising or bleeding.  No acne, rash, lesions.  No joint pain in the neck, back, shoulder, hip.  Denies any numbness, vertigo, ataxia, dementia.  Denies any ADD, OCD, bipolar disorder.  EXAMINATION: VITAL SIGNS:   Celsius : 36.3 degrees C Pulse : 68  Respirations : 20  Systolic BP : 213   Diastolic BP (mmHg) : 92  Mean BP : 103 mm HgOx % : 98  Oxygen Delivery : Room Air/ 21 %APPEARANCE:  NAD, WDWN male Normocephalic, atraumatic.  Pupils are equal, reacting to light and accommodation.  Supple.   No accessory muscle usage.  No anterior chest wall tenderness on palpation.  Regular rate and rhythm.  Soft.   Nontender, nondistended. no CVA tenderness.  phallus with no lesions, b/l testicles palpated no masses Awake, alert, oriented x 3.   No rashes.  No lesions.  Normal turgor.  Warm to touch.  Normal mood and affect. 2.9 3+leuk esterase, >50 RBC. >50 WBC personally reviewed the CT scan and discussed the findings with the patient Comparison: 12/10/2011 Multiple axial images from the lung bases to the symphysis were obtained without oral and without intravenous contrast.  lung bases are clear. There is no pleural or pericardial effusions.bilateral nephroureteral stent present. There is severe right There is severe left hydronephrosis. The proximal of bilateral stents are impacted in the calyx. Again noted is along the anterior aspect of the bladder which may be to prior treatment. liver demonstrates no focal abnormality. The gallbladder is The spleen demonstrates no focal abnormality. There is hyperplasia of bilateral adrenal glands. The pancreas is normal. unopacified stomach, duodenum, small intestine, and large intestine unremarkable, but evaluation is limited by lack of oral contrast.  is no pneumoperitoneum, pneumatosis, or portal venous gas. There is abdominal or pelvic free fluid. There is no lymphadenopathy. is an aortobiiliac stent graft present. The aneurysm sac measures x 4.5 cm. 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 osseous structures are unremarkable.  There bilateral nephroureteral stent present. There is severe right There is severe left hydronephrosis. The proximal of bilateral stents are impacted in the calyx.  There is an aortobiiliac  stent graft present. The aneurysm sac 4.9 x 4.5 cm. There is a large right common femoral artery measuring 4.6 cm in diameter.Again noted is irregularity along the anterior aspect of the bladder may be secondary to prior treatment. Residual or recurrent bladder cannot be excluded. Correlate with cystoscopy if there is further concern.  74 yo male admitted for ? ARF and concern over CT findings ARF/Hydronephrosis Looking through this patient's history, it does not seem that the patient has ARF.  During the last hospitalization in 09/2012, the patient was discharged with a creatinine of 2.88.  Today his creatinine is 2.9.  Last March 2013, there is a documented creatinine of 1.9 at discharge.   Unfortunately, I am unable to see if the patient has several labs as an outpatient that are not seen in his chart, but I spoke to Dr. Gilford Rile and he was able to confirm that there was a creatinine in May of this year that was 3.3. There is no documentation that his creatinine has been below 2.5 in the last several months since his most recent stent exchange on 10/27/12.  While there is moderate hydronephrosis on the CT scan and the stents are said to be "impacted in the calyx" there is no reason to think that these stents are not draining.  These stents are Resonance Metallic stents, per the operative report on 10/27/12 and only need to be exchanged every 6-12 months.  The patient may have persistent hydronephrosis but with his creatinine being around baseline there is no evidence of stent failure at this time, especially since he has made over 2 L of urine in the last 24 hours.spoke to the on-call nephrologist and told him that stent exchange was not necessary at this time.  He also confirmed that the patients true baseline creatinine may not be documented and this may be chronic renal insufficiency. UTIhas indwelling stents and is probably colonized.  The patient had no complaints of UTI and while the UA suggest UTI, I would  not treat this asymptomatic bacteruria unless I was planning on doing stent exchange.  When Dr. Jacqlyn Larsen does a stent exchange, the patient will probably need antibiotics at that time prior to manipulation of stents.   Urology follow upwill contact Dr. Jacqlyn Larsen and let him know that this patient was seen over the weekend.  He can follow up as an outpatient for nonurgent stent exchange at the appropriate time.   you for this consult  Electronic Signatures: Margy Clarks (MD)  (Signed on 14-Jun-14 12:33)  Authored  Last Updated: 14-Jun-14 12:33 by Margy Clarks (MD)

## 2015-01-13 NOTE — Consult Note (Signed)
Notified Pt in hospital:for bilateral stent change next week.with multidrug resistant bladder infection.been trying to get ID involved but pt has missed appts.will have office send Cx to floor for chart.determine if will proceed as scheduled based on course.also needs bladder biopsy for possible recurerent bladder cancer.  Electronic Signatures: Murrell Redden (MD)  (Signed on 28-Jan-14 10:42)  Authored  Last Updated: 28-Jan-14 10:42 by Murrell Redden (MD)

## 2015-01-14 NOTE — Consult Note (Signed)
PATIENT NAME:  [name], [name] MR#:  [id number] DATE OF BIRTH:  [birth year]  DATE OF CONSULTATION:  06/28/2014  REQUESTING PHYSICIAN:  Ceasar Lund. Anselm Jungling, MD CONSULTING PHYSICIAN:  Otelia Limes. Yves Dill, MD  REASON FOR CONSULTATION: Bilateral hydronephrosis and worsening renal function.   HISTORY OF PRESENT ILLNESS: [name] is a 74 year old male admitted to the hospital with worse worsening renal function. He had a CT scan performed October 2 indicating chronic bilateral hydronephrosis with bilateral stents present. Both pigtails were curled properly on both ends of the stents. Stents were replaced by Dr. Edrick Oh on July 28. BUN was 71 and creatinine was 7.77 on July 6. The patient also has a history of metastatic prostate cancer and is followed by Dr. Jacqlyn Larsen and is scheduled to receive a Lupron shot with Dr. Jacqlyn Larsen this Friday. He also has a history of superficial bladder cancer which was being treated with intravesical BCG therapy. He had a bladder biopsy July 28, but those results are not available to me at this time.   ALLERGIES: No drug allergies.   CHRONIC MEDICATIONS:  Include amlodipine and Cipro.   PAST SURGICAL HISTORY: Included:  1.  Bilateral stent placement x 2, most recently July 28.  2.  Status post aortic aneurysm and iliac aneurysm repair.  3.  Possible prostatectomy for prostate cancer.   PAST AND CURRENT MEDICAL CONDITIONS:  1.  Hypertension.  2.  Neurogenic bladder.  3.  Chronic renal disease.  4.  Aortic and iliac vascular disease.  5.  Diabetes.   REVIEW OF SYSTEMS: A recent PSA result was not available at this time.   PHYSICAL EXAMINATION: Deferred.  LABORATORY DATA AND IMAGING:  A CT and laboratory studies were reviewed.   IMPRESSION: 1.  Chronic renal failure, worsening.  2.  Chronic bilateral hydronephrosis, unchanged. 3.  Recent stents replaced July 28 per Dr. Jacqlyn Larsen. 4.  Prostate cancer.  5.  History of bladder cancer.   SUGGESTIONS: 1.  I  discussed the CT findings and reviewed the films with Dr. Truman Hayward, whose opinion was that the hydronephrosis was chronic and unchanged from prior scans and an acute obstruction is not present. Also, the CT scan indicated that he did not have a significant postvoid residual and was not in retention.  2.  Discussed with Dr. Juleen China, whose opinion is that the patient would need chronic dialysis and that the stents should be removed at the next scheduled date and not replaced. 3. Follow-up with Dr. Jacqlyn Larsen this Friday for Lupron injection.   ____________________________ Otelia Limes. Yves Dill, MD mrw:LT D: 06/28/2014 18:12:17 ET T: 06/28/2014 18:35:19 ET JOB#: [id number]  cc: Otelia Limes. Yves Dill, MD, <Dictator> Mamie Levers, MD [place] Lilian Kapur, MD Dion Body, MD Denice Bors. Jacqlyn Larsen, MD Royston Cowper MD ELECTRONICALLY SIGNED 06/29/2014 8:09

## 2015-01-14 NOTE — Consult Note (Signed)
Brief Consult Note: Diagnosis: Mtetastatic prostate cancer. Bladder cancer. Chronic bilateral hydronephrosis. Renal failure.   Patient was seen by consultant.   Consult note dictated.   Recommend further assessment or treatment.   Orders entered.   Discussed with Attending MD.   Comments: Discussed with radiologist and Dr. Juleen China. Stents are in good position and hydronephrosis is chronic and unchanged. Needs follow-up appoitment with Dr. Jacqlyn Larsen for stent removal/exchange.  Electronic Signatures: Royston Cowper (MD)  (Signed 06-Oct-15 18:04)  Authored: Brief Consult Note   Last Updated: 06-Oct-15 18:04 by Royston Cowper (MD)

## 2015-01-14 NOTE — Discharge Summary (Signed)
PATIENT NAME:  [name], [name] MR#:  [id number] DATE OF BIRTH:  October 17, 1940  DATE OF ADMISSION:  06/28/2014 DATE OF DISCHARGE:  07/06/2014  DISCHARGE DIAGNOSES:  1.  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  2.  Neurogenic bladder.  3.  Hypertension.  4.  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   DISCHARGE MEDICATIONS:  Amlodipine 10 mg p.o. daily.   CONSULTANTS: Nephrology, neurology, vascular surgery, infectious disease.   PROCEDURES: The patient had IJ placement on the right side for hemodialysis.   PERTINENT LABORATORY AND STUDIES: On the day of discharge: Sodium 140, potassium 4.2, creatinine 6.56, BUN 36, glucose 96, white blood cell count 14.8, hemoglobin 8, and platelets 214,000.   Urine culture did grow out Enterobacter that was sensitive to Bactrim.   BRIEF HOSPITAL COURSE:  1.  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 The patient initially came in due to acutely worsening renal function and decreased urine output. The patient was never uroseptic, was evaluated by nephrology who started the patient on hemodialysis, he had an IJ placed on the right side and did well with dialysis.  2.  The patient also noted to have chronic neurogenic bladder with a history of hydronephrosis and stent placement and has chronic pyuria and bacteriuria. Upon admission the outpatient urine culture was positive but sensitive to Levaquin.  He was started on Levaquin upon admission, received 7 days of that treatment. Repeat urine culture grew out Enterobacter. The patient remained asymptomatic, was never uroseptic. White blood cell count was slightly elevated. After speaking with Dr. Ola Spurr, who did not believe that the patient needed to be on prophylactic antibiotics, we will hold off on antibiotic therapy at this time. Further treatment per Dr. Jacqlyn Larsen when he sees him as an outpatient. He will need stents replaced at  a future time. The patient is scheduled for hemodialysis as an outpatient. He is followed by Dr. Holley Raring for this.  3.  Other chronic issues include hypertension. His blood pressure has remained stable during his hospital stay.  4.  Anemia of chronic disease that has also remained stable during his hospital stay secondary to the renal failure.   DISPOSITION: The patient has generalized weakness, unable to walk on his own. He was evaluated by physical therapy, who recommend the patient continue with rehabilitation. He is going to a skilled nursing facility where he will receive nursing care and also physical therapy and receive outpatient hemodialysis. The patient needs to keep his scheduled appointment with Dr. Jacqlyn Larsen with urology and also with Dr. Holley Raring with nephrology.    Note: The patient had a questionable seizure episode during his hospital stay. It was not witnessed. EEG was negative; therefore, the initial Keppra that was started was discontinued.    ____________________________ Dion Body, MD kl:lt D: 07/06/2014 14:22:00 ET T: 07/06/2014 15:23:14 ET JOB#: [id number]  cc: Dion Body, MD, <Dictator> Dion Body MD ELECTRONICALLY SIGNED 07/11/2014 8:30

## 2015-01-14 NOTE — Op Note (Signed)
PATIENT NAME:  [name], [name] MR#:  [id number] DATE OF BIRTH:  06-May-1941  DATE OF PROCEDURE:  06/29/2014  PREOPERATIVE DIAGNOSES:   1.  End-stage renal disease.  2.  Diabetes. 3.  Hypertension.  4.  Neurogenic bladder.   POSTOPERATIVE DIAGNOSES: 1.  End-stage renal disease.  2.  Diabetes. 3.  Hypertension.  4.  Neurogenic bladder.   PROCEDURES: 1.  Ultrasound guidance for vascular access to right internal jugular vein.  2.  Fluoroscopic guidance for placement of catheter.  3. Placement of a 19 cm tip-to-cuff tunneled hemodialysis catheter via the right internal jugular vein.   SURGEON:  Algernon Huxley, MD  ANESTHESIA: Local with sedation.   BLOOD LOSS: Minimal.  INDICATION FOR PROCEDURE: A 74 year old gentleman with end-stage renal disease. He has had chronic kidney disease, and has now progressed to end-stage renal failure and the nephrologist elects to start dialysis today. A PermCath is necessary to begin dialysis. Risks and benefits were discussed. Informed consent is obtained.   DESCRIPTION OF THE PROCEDURE: The patient was brought to the vascular and interventional radiology suite. The patient's right neck and chest were sterilely prepped and draped and a sterile surgical field was created. The right internal jugular vein was visualized with ultrasound and found to be patent. It was then accessed under direct ultrasound guidance and a permanent image was recorded. A wire was placed. After a skin nick and dilatation, the peel-away sheath was placed over the wire.   I then turned my attention to an area under the clavicle. Approximately 2 fingerbreadths below the clavicle a small counter incision was created and we tunneled from the subclavicular incision to the access site. Using fluoroscopic guidance, a 19 cm tip-to-cuff tunneled hemodialysis catheter was selected, tunneled from the subclavicular incision to the access site. It was then placed through the peel-away sheath and  the peel-away sheath was removed. The catheter tips were parked in the right atrium. The appropriate distal connectors were placed. It withdrew blood well and flushed easily with heparinized saline and a concentrated heparin solution was then placed. It was secured to the chest wall with 2 Prolene sutures. The access incision was closed with a single 4-0 Monocryl. A 4-0 Monocryl pursestring suture was placed around the exit site. Sterile dressings were placed.   The patient tolerated the procedure well and was taken to the recovery room in stable condition.    ____________________________ Algernon Huxley, MD jsd:MT D: 06/29/2014 10:28:46 ET T: 06/29/2014 11:31:15 ET JOB#: [id number]  cc: Algernon Huxley, MD, <Dictator> Algernon Huxley MD ELECTRONICALLY SIGNED 07/09/2014 12:37

## 2015-01-14 NOTE — Consult Note (Signed)
PATIENT NAME:  [name], [name] MR#:  [id number] DATE OF BIRTH:  1941-03-04  DATE OF CONSULTATION:  02/01/2014  CONSULTING PHYSICIAN:  Janice Coffin. Elnoria Howard, DO  The patient is a 74 year old African American male who presents with problems of bilateral hydronephrosis and incontinence. He has had stents placed. He is totally incontinent. This is not incontinence secondary to overflow, but is incontinence secondary to overactive bladder. 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 The solution to this, in my opinion, will be to put a Foley catheter in or eventually suprapubic catheter and put this patient to drainage for 3 to 4 weeks see if his hydronephrosis resolves. If it does, then we will assume that he is sphincter dyssynergic and will need a chronic Foley catheter. It should be changed every 6 to 8 weeks and should be also a Silastic catheter. If this does not work, then we will just have to deal with him as he is, because the patient dreads a full-time catheter, and I do not blame him. He presents with chronic UTIs and he has been started on Cipro while here. He has had a previous history of neurogenic bladder, bladder cancer followed by Dr. Jacqlyn Larsen,  is a diet-controlled diabetic and history of iliac aneurysm. He has low-grade chronic renal failure, chronic anemia secondary to chronic disease. He has had prostate cancer followed by Dr. Jacqlyn Larsen, so he has no prostate and he has chronic incontinence.   MEDICATIONS:  Amlodipine in daily, and at the present time, he is on Cipro.   FAMILY HISTORY: He has no family history contributory to this problem.   He lives with his son. Current smoker.   PHYSICAL EXAMINATION: His GU exam is as noted above. Generally, well-oriented African American male, today in no acute distress. His prostate is gone on rectal exam. Penis: He is bashful, as he is overweight. He has a diaper on. The thought was, of course, at this  point to do a sphincterotomy and put him on a condom catheter, but I do not think his penis is of adequate length nor circumcised in order to do this easily. Otherwise, skin texture poor. No lymphadenopathy.  Lungs showed slight expiratory wheezing.  Heart sounds regular in rate and rhythm. No gross murmurs, lifts or clicks. Neurologic, grossly physiologic.   LABORATORY DATA RESULTS:  Show the following at the present time, his creatinine is 2.79. BUN is 27. His hemoglobin is 9.7, hematocrit 30.9. He had gram-negative growth, but ID showed no growth.  Clean catch showed gram-negative rods, but he has no growth in 48 hours.   At the present time, he is afebrile and vital signs remain stable. So with a Foley catheter, we could easily send him home and do an ultrasound in 4 to 6 weeks. I will see him in the office in follow.   Thank you very much for this  consult.  ____________________________ Janice Coffin. Elnoria Howard, DO rdh:dmm D: 02/01/2014 12:02:38 ET T: 02/01/2014 12:17:04 ET JOB#: [id number]  cc: Janice Coffin. Elnoria Howard, DO, <Dictator> Yochanan Eddleman D Mylene Bow DO ELECTRONICALLY SIGNED 02/04/2014 14:07

## 2015-01-14 NOTE — Consult Note (Signed)
PATIENT NAME:  LAZLO, TUNNEY MR#:  [id number] DATE OF BIRTH:  Jun 18, 1941  DATE OF CONSULTATION:  07/03/2014  REFERRING PHYSICIAN: Dr Bary Leriche CONSULTING PHYSICIAN:  Audree Camel. Charise Carwin, MD  DIAGNOSIS: New-onset seizure.   HISTORY OF PRESENT ILLNESS: Mr. Dutton is a 74 year old black male with a known prior history of hypertension as well as prostate cancer, diabetes, and chronic kidney disease, undergoing evaluation for new-onset seizure. History was acquired via the  hospital chart and conversation with the patient's nurse. The patient can only provide very minimal history at this time.   Mr. Biggins was originally admitted to [facility] on 06/28/2014 for treatment of acute on chronic renal failure along with management of his other medical issues, including  bilateral hydronephrosis and diabetes as well as hypertension. He was being treated with Levaquin as well as a variety of other medications. Very early this morning, around 3:00 a.m., he apparently cried out. The nurse entered the room and found him in the midst of a generalized tonic-clonic seizure. He was poorly responsive after that point. The duration of the event as well as whether or not there was any type of incontinence or tongue biting is unclear. Emergent head CT was performed, it was unremarkable. He was loaded with Keppra and has apparently done well the rest of the night without any further seizures this morning. According to the nursing observation, he appears to be back to his baseline. There was no known prior history of seizures.   PAST MEDICAL HISTORY: Notable for hypertension, history of noncompliance, history of neurogenic bladder with bladder cancer, history of prostate cancer, chronic kidney disease, bilateral ureteral obstruction or hydronephrosis, history of abdominal aortic aneurysm as well as diet-controlled diabetes. There is also apparently history of cocaine abuse noted and a prior history  of left basal ganglia stroke in 2009.   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 He has had stent placement for ureteral obstruction with hydronephrosis. Hemodialysis in the past. History of a TURP in 2008.   MEDICATIONS: At this time include Keppra 500 mg b.i.d., albuterol/ipratropium nebulizer, multivitamin, magnesium oxide, folic acid, thiamine, Ativan, nitroglycerin, Levaquin, Dulcolax, Procrit injection, Norvasc, acetaminophen/oxycodone p.r.n., Zofran p.r.n., insulin, also vancomycin.   ALLERGIES: PENICILLIN   SOCIAL HISTORY: He resides in the [place] area, lives alone. Smokes 1/2 pack of cigarettes. He occasionally uses alcohol. There appears to be conflicting information regarding substance abuse. Some of his documents indicate no history of illegal drug use, however, I do see a nephrology note from today which indicates that he has a history of cocaine abuse.    FAMILY HISTORY: Reviewed and noncontributory.   REVIEW OF SYSTEMS: Neurological review of systems as noted above.   PHYSICAL EXAMINATION:  GENERAL: Reveals a pleasant, cooperative, elderly black male who is in no acute distress.  VITAL SIGNS: He is currently afebrile. His vital signs are stable. His blood pressure is noted to be 110/70 and he is afebrile. NEUROLOGIC: He is alert and is mildly confused. He correctly is able to identify the year but thinks the month is September and that this is the tenth. He is oriented to place. He is able to name, repeat and follow commands. His other areas of higher level of functioning appear to be intact. His extraocular movements were intact. Pupils equal, round, reactive to light. Face is symmetric. Palate elevates symmetrically. Tongue protrudes midline. Otherwise cranial nerves II-XII appear to be intact. Motor examination: He has normal bulk and tone with  5/5 strength throughout. Sensory examination reveals symmetric pinprick and vibration, however,  there is noted to be some distally symmetric loss of pinprick and vibration in the lower extremities. Cerebellar examination: On finger-to-nose there is no evidence of pronator drift. Deep tendon reflexes are grade 1+ in the upper extremities, trace to absent at the knees and ankles with toes equivocal.  CARDIOVASCULAR: Regular rate and rhythm without murmur, rub or gallop. No evidence of carotid bruits. No evidence of cyanosis, clubbing or edema.  ABDOMEN: Soft, nontender with normoactive bowel sounds.  LUNGS: Clear to auscultation.  SKIN: Reveals no obvious cuts, abrasions, bruises or rashes.   DIAGNOSTIC STUDIES: Include a head CT performed earlier this morning that was unremarkable. He has had laboratory work performed as well with an unremarkable metabolic panel. White count of 16.4, hematocrit is low at 28.   ASSESSMENT: New-onset seizure.   DISCUSSION: At this time, Mr. Bram presents with known history of a prior left basal ganglia and stroke as well as a questionable prior history of cocaine abuse and other medical issues including diabetes, kidney disease, hypertension, etc. He apparently had a new-onset seizure last night that sounds most consistent with a generalized seizure. Whether or not this was a primary or secondary generalized event is unclear. He now is apparently back to his baseline and it is noted that head CT was negative. Overall, the etiology of this seizure is unclear. I do note that he is on Levaquin and the possibility that this could be a contributing or aggravating factor will need to be considered. In this age group, the most common cause of new-onset seizure would be an ischemic event.   PLAN: At this point, I would continue Keppra. EEG will be ordered. I would also consider changing his Levaquin to an alternative antibiotic given the noted relationship of this antibiotic to new-onset seizures. Since he is at baseline, I will hold on performing an MRI at this time,  however, if there are any further mental status changes, I would consider performing a head MRI with and without contrast given the history of cancer. He should be advised not to drive if that is pertinent to his case.   I thank you very much for allowing me to participate in the care of this interesting and pleasant patient.   ____________________________ Audree Camel. Charise Carwin, MD ray:TT D: 07/03/2014 15:16:38 ET T: 07/03/2014 15:36:40 ET JOB#: [id number]  cc: Herbie Baltimore A. Charise Carwin, MD, <Dictator> Cherre Robins MD ELECTRONICALLY SIGNED 08/12/2014 7:15

## 2015-01-14 NOTE — Consult Note (Signed)
Brief note here.full dictated note to follow short, decline in renal function and now appears to be ESRDhas requested a Permcath tomorrow.with patient and he is agreeable to proceed.  Electronic Signatures: Algernon Huxley (MD)  (Signed on 06-Oct-15 17:09)  Authored  Last Updated: 06-Oct-15 17:09 by Algernon Huxley (MD)

## 2015-01-14 NOTE — Consult Note (Signed)
PATIENT NAME:  [name], [name] MR#:  [id number] DATE OF BIRTH:  07/25/1941  DATE OF CONSULTATION:  07/06/2014  REQUESTING PHYSICIAN:  Dion Body, MD CONSULTING PHYSICIAN:  Cheral Marker. Ola Spurr, MD  REASON FOR CONSULTATION: Recurrent urinary tract infections and multidrug-resistant organism.   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 He is followed with Dr. Jacqlyn Larsen in the past and has bilateral stents in place. He receives Lupron shots for his prostate cancer. He also has a history of bladder cancer treated with BCG. He was admitted October 6 with progressive renal failure. He follows with Dr. Holley Raring as an outpatient. The patient has since been started on dialysis. He also has a history of recurrent urinary tract infections. Most recently, he apparently had an outpatient culture with Enterobacter sensitive to levofloxacin and he was treated for this. We are consulted because he continues to have pyuria as well as positive urinary cultures.   The patient is a relatively poor historian. He says he makes some urine and wears Depends. He is not having any fevers, chills. He just had a urine in-and-out catheterization done and the nurse reports his urine is "creamy."    PAST MEDICAL HISTORY: 1. 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  2.  Prostate cancer treated with questionable prostatectomy as well as Lupron.  3.  Hypertension.  4.  Neurogenic bladder with a history of bladder cancer followed by Dr. Jacqlyn Larsen.  5.  Bilateral urethral obstruction, hydronephrosis with stent placement bilaterally.  6.  AAA and iliac aneurysm repair in the past.  7.  Diet-controlled diabetes.   SOCIAL HISTORY: Lives alone, smokes 1/2 pack a day, occasional alcohol.   FAMILY HISTORY:  Noncontributory.   REVIEW OF SYSTEMS:  Difficult to obtain due to patient's mild to moderate confusion.  ANTIBIOTICS SINCE ADMISSION: Include:  1.  Clindamycin, which he received October 6.  2.  Levofloxacin, October 5-9.  3.  Vancomycin October 9.   PHYSICAL EXAMINATION: VITAL SIGNS: Temperature 97.6, pulse 73, blood pressure 95/60, respirations 20, saturation 99% on room air. He has been afebrile throughout his admission.  GENERAL: He is lying in bed. He is disheveled. He is in no acute distress.  HEENT: His pupils are equal, round, reactive to light and accommodation. Oropharynx is clear.  NECK: Supple.  HEART: Regular.  LUNGS: Clear.  ABDOMEN: Obese, soft, nontender.  EXTREMITIES: No clubbing, cyanosis, or edema.  NEUROLOGIC: He is somewhat confused, able to move all 4 extremities.   DIAGNOSTIC DATA: His white blood count on admission was 8.1. gone up to 16 and is currently 14.8.  Hemoglobin 8.0, platelets 214,000.  Renal panel is consistent with end-stage renal disease. Urinalysis done October 11 showed 3+ leukocyte esterase, 4281 whites, 49 reds. Urine culture from October 11 grew greater than 100,000 Enterobacter, sensitive to gentamicin, imipenem, ertapenem, and Bactrim. Blood cultures 1 of 2, grew gram-positive cocci in anaerobic bottle. ID to follow.  IMAGING:  CT of the head showed involutional changes, moderate to severe white matter disease. CT of the abdomen and pelvis October 2 showed bilateral double-J ureteral stent with the left stent mildly migrated compared to 2014.  There is chronic bilateral hydronephrosis and hydroureter, stable bladder with no obstructive etiology identified.  Stable to moderate prostate enlargement.  IMPRESSION: A 74 year old gentleman chronically ill, now with end-stage renal disease,  on hemodialysis this admission, as well as obstructive uropathy, prior prostate cancer, and bladder cancer. He has positive urinalysis and multidrug-resistant  Enterobacter in his urine; however, he really has minimal urinary symptoms and does not appear to make a large amount of urine. I suspect he will have chronic pyuria and bacteriuria, and it will be difficult to tell if and when he has an infection. He does have a mild elevation in his white blood count, but he has had no fevers. There are treatment options for this resistant bacteria including Bactrim or gentamicin, which can be given at dialysis. It is also sensitive to the carbapenems. He has been seen by urology and is followed by renal closely.   RECOMMENDATIONS: 1. I have asked the nurse to do another repeat in-and-out catheterization. The urine was somewhat cloudy and creamy per the nurse.  2.  The patient has been on antibiotics for the last several days to 2 weeks to which the bacteria is resistant. This suggests that he has not worsened and that this is not an active infection, but more colonization. However, he certainly is at risk for progression to active infection.  3.  I would hold off on antibiotics at this time unless he worsens or develops new or concerning symptoms.  4. I would certainly have him follow up with Dr. Jacqlyn Larsen as soon as possible after discharge for stent exchange and further bladder evaluation, especially given his history of bladder cancer.   Thank you for the consult. I will be glad to follow with you.    ____________________________ Cheral Marker. Ola Spurr, MD dpf:LT D: 07/06/2014 14:27:06 ET T: 07/06/2014 16:30:59 ET JOB#: [id number]  cc: Cheral Marker. Ola Spurr, MD, <Dictator> Chelly Dombeck Ola Spurr MD ELECTRONICALLY SIGNED 07/19/2014 9:29

## 2015-01-14 NOTE — H&P (Signed)
PATIENT NAME:  [name], [name] MR#:  [id number] DATE OF BIRTH:  1941-03-11  DATE OF ADMISSION:  01/30/2014  HISTORY OF PRESENT ILLNESS: [name] is a 74 year old black gentleman who was brought to the Emergency Room because of difficulty ambulating. The patient stated that he fell on the floor when he tried to get up. He could not get up without assistance. The patient has someone living with him part of the time, but not all of the time. He was brought to the Emergency Room where he was found to have evidence of a urinary tract infection. Evaluation by the ER physician showed that he was unable to ambulate without assistance. The patient is being admitted for cultures and treated with antibiotics. He will be rehydrated with IV fluids. He will be started on Cipro pending culture reports.   PAST MEDICAL HISTORY: Notable for hypertension with a history of noncompliance with medication. Has a known neurogenic bladder and a history of bladder cancer followed by [name]. The patient has known diet controlled diabetes. He has a history of abdominal aortic aneurysm and iliac aneurysm followed by [name]. He has chronic renal failure and has been followed in the past by [name], but again compliance has been poor. The patient has a history of chronic anemia of chronic disease with a hemoglobin was generally between 9 and 10. The patient also has a history of prostate cancer, again, followed by [name].   PAST SURGICAL HISTORY: Includes previous abdominal aortic aneurysm repair. He has also had surgery on his urinary bladder.   CURRENT MEDICATIONS: Amlodipine 10 mg daily.   ALLERGIES: THE PATIENT HAS AN UNSPECIFIED ALLERGY TO PENICILLIN.   FAMILY HISTORY: Noncontributory.   SOCIAL HISTORY: He lives with his son in his own home, but is left unattended much of the day. The patient has a current smoker, but smokes less than 1/2 pack a day. He has a history of occasional alcohol use.   REVIEW OF  SYSTEMS: Essentially unremarkable except for some recent low back pain that radiated his legs with muscle spasm. He has no known injury.   PHYSICAL EXAMINATION: VITAL SIGNS: Weight 245 pounds, blood pressure 119/82, pulse 85, respirations 20.  IN GENERAL: This is an elderly black gentleman who is alert and oriented and in no acute distress.  SKIN: Normal in color. Textured is poor. There is no lymphadenopathy.  HEENT: Examination of head, ears, eyes, nose and throat was most notable for arterial narrowing on funduscopic exam.  NECK: The neck was supple. Thyroid is not palpable. There is no JVD. There are no carotid bruits.  LUNGS: Clear to auscultation.  CARDIAC: Regular rhythm with no murmurs or gallops. S1, S2 were normal.  ABDOMEN: Soft and nontender. Liver and spleen are not enlarged. Bowel sounds were normal. There is no CVA tenderness.  EXTREMITIES: Show no edema or deformity.  NEUROLOGIC: Grossly physiological with generalized weakness, particularly in the lower extremities.   CBC shows a hemoglobin of 9.7 with a hematocrit of 30.9. Comprehensive metabolic panel shows a random blood sugar of 124. BUN is 32 with a creatinine of 3.07. Electrolytes are notable only for a chloride of 112. Albumin is 3.1. Estimated GFR was 22. Urinalysis showed 3+ leukocyte esterase. Chest x-ray is unremarkable.   IMPRESSION: 1. Failure to thrive secondary to urinary tract infection.  2. Hypertension.  3. History of bladder and prostate cancer.  4. Anemia of chronic disease.  5. Chronic renal failure.   As noted above,  the patient will be admitted for rehydration with IV fluids. He will have blood and urine cultures done. The patient will be started on Cipro 400 mg b.i.d. IV pending culture reports.    ____________________________ Hewitt Blade Sarina Ser, Clarke jbw:sg D: 01/30/2014 12:17:19 ET T: 01/30/2014 13:02:31 ET JOB#: [id number]  cc: Jenny Reichmann B. Sarina Ser, Clarke, <Dictator> Ethan Mussel III  Clarke ELECTRONICALLY SIGNED 02/01/2014 14:13

## 2015-01-14 NOTE — Op Note (Signed)
PATIENT NAME:  [name], [name] MR#:  [id number] DATE OF BIRTH:  22-Jun-1941  DATE OF PROCEDURE:  08/22/2014  PREOPERATIVE DIAGNOSES:  1.  End-stage renal disease.  2.  Hypertension  3.  Chronic pulmonary disease.  POSTOPERATIVE DIAGNOSES:  1.  End-stage renal disease.  2.  Hypertension  3.  Chronic pulmonary disease.  PROCEDURES: Left brachiocephalic arteriovenous fistula creation.   SURGEON: Algernon Huxley, MD   ANESTHESIA: General.   ESTIMATED BLOOD LOSS: Minimal.   INDICATION FOR PROCEDURE: This is a 74 year old gentleman with end-stage renal disease. He has a need for permanent dialysis access and adequate anatomy for fistula creation in the left arm. Risks and benefits were discussed. Informed consent is obtained.   DESCRIPTION OF PROCEDURE: The patient is brought to the operative suite, and after an adequate level of general anesthesia was obtained, the left upper extremity was sterilely prepped and draped and a sterile surgical field was created. A curvilinear incision was created at the antecubital fossa dissecting out what appeared to be a nice cephalic vein for fistula creation. This dissected down to its splits or what was likely median antecubital vein and the cephalic vein at the antecubital fossa and it was at this area where both of the distal branches were ligated and divided and it was prepared for fistula creation. The artery was dissected out. This was a mildly diseased artery, but was of good size and adequate for fistula creation. The artery was controlled with vessel loops proximally and distally. The patient was given 3000 units of intravenous heparin. An anterior wall arteriotomy was created with an 11 blade and extended with Potts scissors, once the vessel loops were pulled up for control. The vein was then cut and beveled distally at this bifurcation to create a wide fishmouth vein for an anastomosis. A 6-0 Prolene was used for anastomosis and the vessel was flushed  and deaired prior to releasing control. Two 6-0 Prolene patch sutures were used for hemostasis and hemostasis was achieved. The wound was then irrigated. Surgicel was placed. The wound was then closed with 3-0 Vicryl and 4-0 Monocryl. Dermabond was placed as a dressing. At the conclusion of the procedure, the patient a palpable pulse distal to the fistula as well as a nice thrill within the AV fistula. Dermabond was placed as a dressing.    ____________________________ Algernon Huxley, MD jsd:TT D: 08/22/2014 14:41:23 ET T: 08/22/2014 16:19:39 ET JOB#: [id number]  cc: Algernon Huxley, MD, <Dictator> Algernon Huxley MD ELECTRONICALLY SIGNED 09/04/2014 11:43

## 2015-01-14 NOTE — Discharge Summary (Signed)
PATIENT NAME:  [name], WEIDA MR#:  [id number] DATE OF BIRTH:  05-31-1941  DATE OF ADMISSION:  01/30/2014 DATE OF DISCHARGE:  02/02/2014  DISCHARGE DIAGNOSES: 1.  Urinary tract infection that grew out 60,000 Providencia species sensitive to Bactrim.  2.  Chronic renal failure with creatinine of 2.79 on 02/01/2014.  3.  Chronic urinary incontinence with neurogenic bladder.  4.  Bilateral severe hydronephrosis.  5.  Anemia of chronic disease with a hemoglobin of 8.6 on 02/01/2014.  6.  Generalized weakness with major fall risk.   DISCHARGE MEDICATIONS: 1.  Amlodipine 10 mg p.o. daily.  2.  Magnesium hydroxide 30 mL p.o. at bedtime as needed for constipation.  3.  Bactrim DS 1 tab p.o. b.i.d. x 7 more days.   CONSULT: Urology per Dr. Elnoria Howard, nephrology per Dr. Holley Raring.   PROCEDURES: None.   LABORATORY, DIAGNOSTIC AND RADIOLOGICAL DATA:  Urine culture grew out 60,000 Providencia species sensitive to Bactrim. Prior to discharge, sodium 143, potassium 3.9, creatinine 2.79, glucose 97. White blood cell count 7.4, hemoglobin 8.6, platelets 266. Blood cultures were no growth to date.   BRIEF HOSPITAL COURSE:  1.  Urinary tract infection. The patient presented with symptoms of failure to thrive and found to have acute on chronic urinary tract infection. The patient has a history of pyuria with a neurogenic bladder with severe hydronephrosis found on ultrasound with history of stent placement followed by Dr. Jacqlyn Larsen in the past. He was evaluated by both urology and nephrology. Per urology, Dr. Elnoria Howard, he recommended Foley placement for chronic Foley placement. Will need to have that changed out in his office in a couple of days. Will also need a repeat ultrasound in 4 to 6 weeks. Please contact Dr. Guinevere Ferrari office for further Foley care. The patient is to be on 7 more days of the Bactrim.  2.  Hypertension. To remain on amlodipine at the higher dose of 10 mg daily.  3.  Weakness and falls risk. The patient  lives a sedentary life was evaluated by physical therapy and needs further therapy. He is being referred to a skilled nursing facility for nursing care and Foley care and for physical therapy.   DISPOSITION: The patient is in stable condition to be discharged from the hospital and will need further rehab, going to a skilled nursing facility. Will follow up with Dr. Elnoria Howard in the next 4 to 6 weeks, follow up with Dr. Candiss Norse in the next 3 to 4 weeks and follow up with Dr. Netty Starring after his discharge from the skilled nursing facility.   ____________________________ Dion Body, MD kl:cs D: 02/02/2014 16:23:19 ET T: 02/02/2014 20:02:17 ET JOB#: [id number]  cc: Dion Body, MD, <Dictator> Dion Body MD ELECTRONICALLY SIGNED 02/21/2014 8:27

## 2015-01-14 NOTE — H&P (Signed)
PATIENT NAME:  [name], [name] MR#:  [id number] DATE OF BIRTH:  07-14-1941  DATE OF ADMISSION:  06/28/2014  PRIMARY CARE PHYSICIAN: Dr. Netty Starring  REFERRING PHYSICIAN: Dr. Anthonette Legato   CHIEF COMPLAINT: Worsening renal function.   HISTORY OF PRESENT ILLNESS: A 74 year old male who has past history of chronic kidney disease, hypertension, and diet controlled diabetes, had bilateral ureteric stent placement and prostate cancer in the past. He is regularly following with Dr. Holley Raring and today was his regular appointment. He checked the renal function, which showed there is acute worsening in his chronic kidney disease with some acidosis so he decided to send the patient directly into the hospital. He had also CAT scan done 3 days ago as outpatient which showed that his left stent on the ureter has slightly migrated so Dr. Holley Raring called in that the patient needs to get admitted and possibly to be started on hemodialysis in the hospital. On further questioning, the patient denies any complaint and he came just because Dr. Holley Raring told him to come to the hospital.   REVIEW OF SYSTEMS: CONSTITUTIONAL: Negative for fever, fatigue, weakness, pain or weight loss.  EYES: No blurring of vision, discharge or redness.  EARS, NOSE, THROAT: No tinnitus, ear pain or hearing loss.  RESPIRATORY: No cough, wheezing, hemoptysis, or shortness of breath.  CARDIOVASCULAR: No chest pain, orthopnea, edema, arrhythmia, palpitations.  GASTROINTESTINAL: No nausea, vomiting, diarrhea, abdominal pain.  GENITOURINARY: No dysuria, hematuria, or increased frequency.  ENDOCRINE: No heat or cold intolerance. No excessive sweating.  SKIN: No acne, rashes or lesions.  MUSCULOSKELETAL: No pain or swelling in the joints.  NEUROLOGICAL: No numbness, weakness, tremor or vertigo.  PSYCHIATRIC: No anxiety, insomnia, bipolar disorder.   PAST MEDICAL HISTORY: 1.  Hypertension with history of noncompliance.  2.  Neurogenic bladder  with bladder cancer history, followed by Dr. Jacqlyn Larsen.  3.  Prostate cancer, followed by Dr. Jacqlyn Larsen. 4.  Chronic kidney disease.  5.  Bilateral ureteral obstruction and hydronephrosis and status post stent placement bilaterally.  6.  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  7.  Diet-controlled diabetes mellitus.   FAMILY HISTORY: Does not know about father. His mother died of old age.   SOCIAL HISTORY: He lives alone. Currently smokes half pack of cigarettes a day. Occasional alcohol use. No illegal drug use.   HOME MEDICATIONS:  1.  Amlodipine 10 mg daily.  2.  Ciprofloxacin 250 mg b.i.d.   PHYSICAL EXAMINATION: VITAL SIGNS: Temperature 97.8, pulse 74, respirations 16, blood pressure 116/72 and pulse ox is 98 on room air.  GENERAL: The patient is fully alert and oriented to time, place and person. Does not appear in any acute distress.  HEENT: Head and neck atraumatic. Conjunctivae pink. Oral mucosa moist.  NECK: Supple. No JVD.  RESPIRATORY: Bilateral equal and clear air entry. CARDIOVASCULAR: S1 and S2 present, regular. No murmur.  ABDOMEN: Soft, nontender. Bowel sounds present. No organomegaly.  SKIN: No rashes.  LEGS: No edema.  NEUROLOGICAL: Power 5/5. Follows commands. No gross abnormality. PSYCHIATRIC: Does not appear in any acute psychiatric illness.  JOINTS: No swelling or tenderness.   DIAGNOSTIC DATA: CT of abdomen and pelvis as mentioned above. Bilateral hydronephrosis, chronic, with slight migration of left double-J ureteral stent.  Other lab reports: Dr. Holley Raring sent the report to the hospital from his office and they are as following:  Glucose 99, BUN 70, creatinine 7.61. GFR 7. Sodium 143, potassium 5, chloride 109 and carbon dioxide  12. Calcium 9.  Urine culture was drawn on 1st of October which grew Enterobacter cloacae, which is sensitive to ciprofloxacin and levofloxacin   ASSESSMENT AND PLAN: A 74 year old male with past medical history of  hypertension and chronic kidney disease, following with Dr. Holley Raring, and found to have worsening in renal function with acidosis today so sent for direct admission.  1.  Acute on chronic renal failure. Nephrology consult stat, seen by Dr. Juleen China in the hospital, and is planning to start him on hemodialysis. She called for vascular consult to manage further.  2.  Bilateral hydronephrosis with slight dislocation of the left ureteric stent. Urology consult for this.  3.  Acidosis. This is metabolic because of the patient's renal failure and might need hemodialysis soon. Plan is to start tomorrow.  3.  Diabetes. As per the patient, this is diet controlled. We will keep him on insulin sliding scale coverage.  4.  Hypertension. We will continue amlodipine.  5.  Smoking. Tobacco cessation counseling is done for 4 minutes and offered nicotine patch. He does not need that at this time.   TOTAL TIME SPENT ON THIS ADMISSION: 50 minutes.  ____________________________ Ceasar Lund Anselm Jungling, MD vgv:sb D: 06/28/2014 16:04:27 ET T: 06/28/2014 16:21:54 ET JOB#: [id number]  cc: Ceasar Lund. Anselm Jungling, MD, <Dictator> Munsoor Lilian Kapur, MD Denice Bors. Jacqlyn Larsen, MD Dion Body, MD  Vaughan Basta MD ELECTRONICALLY SIGNED 07/15/2014 21:03

## 2015-01-14 NOTE — Consult Note (Signed)
PATIENT NAME:  [name], [name] MR#:  [id number] DATE OF BIRTH:  1941-07-16  DATE OF CONSULTATION:  06/28/2014  REFERRING PHYSICIAN:   CONSULTING PHYSICIAN:  Algernon Huxley, MD  REQUESTING PHYSICIAN:  Dr. Juleen China.    REASON FOR CONSULTATION: Dialysis access.   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 He was seen in the nephrology clinic, he has had bilateral ureteral stent placement, prostate cancer in the past.  When his renal function was checked at his regular followup today he has had acute worsening and now needs dialysis. He has chronic kidney disease likely secondary to hypertension, diabetes, and his issues with obstruction. He is not really having any complaints. He does not have any obvious uremia. He does not have any shortness of breath or chest pain. He does not have pronounced itching.    REVIEW OF SYSTEMS:    GENERAL:  No fevers or chills. No unintentional weight loss or gain.  EYES: No blurry or double vision.  EARS: No tinnitus or ear pain.  RESPIRATORY: No shortness of breath, cough, or wheeze.  CARDIAC: No chest pain or palpitations.  GASTROINTESTINAL: No nausea, vomiting, diarrhea. Appetite has been good.  GENITOURINARY: No dysuria or hematuria currently, he has had obstruction issues previously.  ENDOCRINE: No heat or cold intolerance.  SKIN: No new rashes or ulcers.  MUSCULOSKELETAL: No new pain or swelling of the joints.   NEUROLOGIC:  No TIA, stroke, or seizure symptoms.  PSYCHIATRIC: No anxiety or depression.   PAST MEDICAL HISTORY:    1. Hypertension with history of noncompliance.  2. Neurogenic bladder followed by Dr. Jacqlyn Larsen.   3. Prostate cancer followed by Dr. Jacqlyn Larsen.  4. Chronic kidney disease.  5. Bilateral ureteral obstruction and hydronephrosis with stent placement previously.   6. 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  7. Diet controlled diabetes.   FAMILY HISTORY: Mother died of old age. Father unknown.   SOCIAL HISTORY: Lives alone. Continues to smoke about half a pack per day. Occasional alcohol use.  No illegal drugs.   HOME MEDICINES: Include amlodipine 10 mg daily and ciprofloxacin 250 b.i.d.   ALLERGIES: PENICILLINS.   PHYSICAL EXAMINATION:  VITAL SIGNS: Temperature of 97.8, pulse of 74, respirations 16, blood pressure is 116/72, saturations are 98% on room air.  GENERAL:  Awake, alert, and oriented, in no acute distress, appears age-appropriate.  HEENT: Normocephalic and atraumatic. Eyes, sclerae anicteric. Conjunctivae are clear. Ears, normal external appearance, hearing is intact.  NECK: Supple. No adenopathy or JVD.  RESPIRATORY: No increased respiratory effort or use of accessory muscles.  CARDIAC: Regular rate and rhythm. No JVD.  ABDOMEN: Soft, nondistended, nontender.   SKIN:  Good turgor. No rashes or ulcers.  EXTREMITIES: Show no cyanosis, clubbing, or edema. Pedal pulses are present.  NEUROLOGIC: Normal strength tone in all 4 extremities. Follows commands.   PSYCHIATRIC: Awake, alert, and oriented, not in acute distress. Affect is normal.   LABORATORY EVALUATIONS: Reveal a sodium of 141, potassium 4.7, chloride is 120, CO2 is 14, BUN 71, creatinine 7.77, glucose is 92. White blood cell count is 8.1, hemoglobin is 8.5, platelet count is 411,000. INR is 1.2.   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 We will plan to place a PermCath tomorrow. I have discussed the difference between PermCath, AV fistula graft, types of hemodialysis and would ultimately recommend getting AV fistula graft.  This workup will be done as an outpatient with vein mapping and subsequent  permanent access, but these will not be usable initially and he will need a catheter at this time.    ____________________________ Algernon Huxley, MD jsd:bu D: 07/04/2014 17:17:58 ET T: 07/04/2014 17:52:19 ET JOB#: [id number]  cc: Algernon Huxley, MD, <Dictator> Algernon Huxley MD ELECTRONICALLY SIGNED 07/21/2014 15:24

## 2015-01-14 NOTE — Op Note (Signed)
PATIENT NAME:  [name], POOLEY MR#:  [id number] DATE OF BIRTH:  1941-04-11  DATE OF PROCEDURE:  04/19/2014  PRINCIPAL DIAGNOSES:  1.  Bilateral hydronephrosis. 2.  Metastatic prostate cancer. 3.  History of bladder cancer.  4.  Bladder mass.   POSTOPERATIVE DIAGNOSES:  1.  Bilateral hydronephrosis. 2.  Metastatic prostate cancer. 3.  History of bladder cancer.  4.  Bladder mass.   PROCEDURES:  1.  Cystoscopy.  2.  Bilateral double-J ureteral stent change.  3.  Cystoscopy.  4.  Bladder biopsy.   SURGEON: Edrick Oh, M.D.   ANESTHESIA: Laryngeal mask airway anesthesia.   INDICATIONS: The patient is a 74 year old African American gentleman who initially presented with extensive intravesical bladder tumor. He underwent resection and subsequent intravesical BCG therapy. He has had 1 recurrence of tumor on recent cystoscopy. He was noted to have an irregular area of mucosa on the posterior right bladder wall suspicious for recurrent tumor. He is also approximately 6 months past due for routine stent change for hydronephrosis. He presents today for stent change and bladder biopsy.   DESCRIPTION OF PROCEDURE: After informed consent was obtained the patient was taken to the operating room and placed in the dorsal lithotomy position under laryngeal mask airway anesthesia. The patient was then prepped and draped in the usual standard fashion. The 31 French rigid cystoscope was introduced into the urethra under direct vision with no urethral abnormalities noted. At the external sphincter the sphincter was noted to be wide open with no closure. This is consistent with his known pelvic issues from his previous aneurysm repair.   The prostate fossa was also open. The prostate was noted to be relatively short. Upon entering the bladder a significant amount of debris was encountered. Stents were noted protruding from bilateral ureteral orifices.  The bladder was relatively contracted. The orifices are  widely dilated. There appeared to be 2 orifices on the left. This has not been noted previously.  He has not been able to receive IV contrast due to chronic renal failure. It appears that he may have a duplicated system on the left. This has not been appreciated in the past.   The cystoscope was able to be advanced partially up the ureter due to the degree of dilation. The stent was noted protruding from the more medial inferior opening. The debris was irrigated from the bladder for better visualization. There was an approximately 1-cm area of raised erythematous irregular mucosa on the right posterior bladder wall. Other areas of the bladder were relatively normal. A flexible-tip Glidewire was introduced into the left ureteral orifice with the stent. It was advanced into the upper pole collecting system without difficulty. Grasping forceps were then utilized to remove the indwelling Resonance stent. A new 6 French x 24-cm double-J Resonance stent was advanced through the sheath placed over the guidewire. The guidewire was removed. The stent was advanced into the renal pelvis. The sheath was then gradually removed. Adequate curl was maintained on the stent within the renal pelvis. Adequate curl was also noted within the urinary bladder. The decision was made not to place a stent in the presumed 2nd ureter. There was some high-pressure urine draining from the upper pole collecting system. The urine, however, was clear. Minimal stent encrustation was noted on the stent that was removed.   A flexible tip Glidewire was then introduced into the right ureteral orifice alongside the stent. The stent was removed utilizing grasping forceps. It too was noted to have minimal  encrustation. The sheath was then placed over the guidewire. The guidewire was removed. A new 6 French x 24-cm double-J Resonance stent was advanced through the sheath. The sheath was then withdrawn, adequate curl was noted within the renal pelvis.  Adequate curl was also noted within the urinary bladder.   With both stents in place the cystoscope was replaced into the urinary bladder. Cold cup biopsy forceps were utilized to obtain a biopsy from the irregular abnormal area on the posterior right bladder wall. This was sent to pathology for further evaluation. The remaining area was extensively cauterized utilizing electrocautery. No significant bleeding was encountered. The bladder was then drained. The cystoscope was removed. The patient was returned to the supine position and awakened from laryngeal mask airway anesthesia. He was taken to the recovery room in stable condition. There were no problems or complications. The patient tolerated the procedure well.    ____________________________ Denice Bors. Jacqlyn Larsen, MD bsc:lt D: 04/20/2014 08:27:56 ET T: 04/20/2014 08:56:03 ET JOB#: [id number]  cc: Denice Bors. Jacqlyn Larsen, MD, <Dictator> Denice Bors Malacki Mcphearson MD ELECTRONICALLY SIGNED 04/27/2014 16:20

## 2015-01-14 NOTE — Consult Note (Signed)
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   Patient was seen by consultant.   Consult note dictated.   Recommend to proceed with surgery or procedure.   Recommend further assessment or treatment.   Orders entered.   Discussed with Attending MD.   Comments: Repeat ua and ucx Would monitor off abx at this time. If worsens can start bactrim or gent at HD Will need to fu Dr Jacqlyn Larsen I can see as otpt.  Electronic Signatures: Angelena Form (MD)  (Signed 14-Oct-15 14:29)  Authored: Brief Consult Note   Last Updated: 14-Oct-15 14:29 by Angelena Form (MD)

## 2015-01-15 NOTE — Discharge Summary (Signed)
PATIENT NAME:  [name], [name] MR#:  [id number] DATE OF BIRTH:  [birth year]  DATE OF ADMISSION:  12/10/2011 DATE OF DISCHARGE:  12/14/2011  DISCHARGE DIAGNOSES:  1. Acute dyspnea post urologic procedure.  2. Acute renal failure on chronic renal insufficiency. 3. Normal LV function with left ventricular hypertrophy.  4. Work-up negative with a V/Q scan for his shortness of breath and dyspnea.   HOSPITAL COURSE: After giving IV fluids his renal insufficiency improved.  He no longer had acute renal failure with his baseline GFR of approximately 37 at this point. He is feeling back to normal and ambulating some. He is still weak. He needs to be at a rehab center, which has been arranged. He is headed to [facility] today. He was taken off antibiotics so he grew two bacteria in his urine because it was felt not to be an acute infection, felt to be contaminate from his urostomy tube. That was taken out and stent was changed over. He was urinating well last night. This will need to be watched closely. He will need to see Dr. Jacqlyn Larsen, his urologist, in the next week or so to follow this up. Would recommend CBC and a Met-B in 2 to 3 days as well.  Please note Dr. Netty Starring followed the patient throughout most of the hospitalization.   TIME SPENT:  It took approximately 35 minutes to do all discharge tasks today and get this arranged.    ____________________________ Ocie Cornfield. Ouida Sills, MD mwa:bjt D: 12/14/2011 12:00:22 ET T: 12/14/2011 12:25:47 ET JOB#: [id number]  cc: Ocie Cornfield. Ouida Sills, MD, <Dictator> Kirk Ruths MD ELECTRONICALLY SIGNED 12/15/2011 9:32

## 2015-01-15 NOTE — Op Note (Signed)
PATIENT NAME:  [name], NOSBISCH MR#:  [id number] DATE OF BIRTH:  10/28/1940  DATE OF PROCEDURE:  10/01/2011  PREOPERATIVE DIAGNOSES:  1. Adenocarcinoma of the prostate with metastasis.  2. Bilateral hydronephrosis.   POSTOPERATIVE DIAGNOSES:  1. Metastatic adenocarcinoma of the prostate.  2. Bilateral hydronephrosis. 3. Bladder tumor. 4. Prostatic tumor. 5. 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   SURGEON: Denice Bors. Jacqlyn Larsen, MD   ANESTHESIA: General endotracheal anesthesia.   INDICATIONS: The patient is a 74 year old African American gentleman with a history of transitional cell carcinoma of the bladder. He presented several months prior with bilateral hydronephrosis and elevated PSA. He underwent cystoscopy and stent placement. The left stent was unable to be advanced completely into the kidney. He was near renal failure at the time. He has had subsequent improvement in his renal function to a creatinine of 2.0. No evidence of bladder tumor recurrence was noted at the time of previous cystoscopy. He was diagnosed with metastatic prostate cancer. He has been started on hormonal therapy. He has had significant improvement in many of his symptoms. He presents for cystoscopy stent change.   DESCRIPTION OF PROCEDURE: After informed consent was obtained, the patient was taken to the operating room and placed in the dorsal lithotomy position under general endotracheal anesthesia. The patient was then prepped and draped in the usual standard fashion. The 60 French rigid cystoscope was introduced into the urethra under direct vision with no urethral abnormalities noted. At the level of the external sphincter, the sphincter was noted to be open. Upon entering the prostatic fossa, an area of polypoid-appearing tissue was noted extending from the right apical prostate. With his history of transitional cell carcinoma, the decision was made to proceed with biopsy. Cold cup biopsy forceps were utilized to remove the  area. The area was then cauterized utilizing a Bugbee electrode. Minimal prostatic hypertrophy was otherwise noted. Upon entering the bladder, the mucosa was inspected in its entirety. An approximate 1 cm area of raised irregular papillary appearing mucosa was noted on the right posterior bladder wall. Some inflammation was noted surrounding both ureteral orifices. Stents were noted protruding from the right and left ureteral orifices. An initial attempt was made at passing a guidewire into the left ureteral orifice. Multiple attempts were made with subsequent passage to the proximal ureter. The guidewire was noted to curl in the proximal ureter on multiple occurrences. With continued manipulation, it was able to be advanced into what appeared to be the renal pelvis. An attempt was made at advancing a 6 [place] open-ended catheter over the guidewire to perform a retrograde pyelogram. This was unable to be advanced beyond the curl in the proximal urethra. The guidewire was removed but the stent was left at the level of the curl. Retrograde pyelogram was performed at this site. The ureter between the area of the curl and the kidney was never completely visualized. There appeared to be some areas of possible filling defect. The lateral calyces of the kidney were noted to be dilated. The upper pole calyces and renal pelvis were never fully identified with the retrograde pyelogram. The guidewire was unable to be advanced further into the kidney beyond the level of the presumed renal pelvis. Multiple attempts were made at passing the guidewire. This resulted in curling within the bladder and loss of access. The decision was made to proceed with ureteroscopy for further evaluation. The distal ureter demonstrated significant inflammation. It was more consistent with inflammation, however, than with tumor. No definitive tumors were  noted within the lower or midureter at the level of the dilation and curling of the guidewire.  An area of polypoid-appearing tissue was encountered. No definitive tumor was noted. Multiple attempts were made at advancing the scope into the more proximal ureter. This was unsuccessful. Due to the inability to advance the open-ended catheter, decision was made not to attempt flexible ureteroscopy beyond this point. An attempt was made at passing a standard stent over the guidewire. This was met with significant resistance at the level of the curl. The decision was made to attempt a 4.8 [place] stent. This also met significant resistance at the level of the coil. A small amount of the stent was able to be advanced just beyond the coil. It was unable to be advanced, however, into the renal pelvic region. This is worrisome for possible tumor. There was no definitive evidence of stricture per se. A portion of the stent was noted extending within the bladder. The stent that was previously indwelling on the right was removed utilizing grasping forceps. The left had also been previously removed prior to the previously noted dictation. A guidewire was able to be advanced beside the right stent into the upper pole calyces without difficulty. The sheath was advanced over the guidewire into the renal pelvis without difficulty. Retrograde pyelogram was also performed. This demonstrated normal calyces throughout. They were moderately dilated. A resonance stent that was 6 [place] x 26 cm was advanced through the sheath into the renal pelvis. Adequate curl was noted within the renal pelvis also within the urinary bladder. Cold cup biopsy forceps were taken from the mass on the right posterior lateral wall. Multiple biopsies were obtained. The area was extensively cauterized. Two other areas of slight raised erythematous mucosa were noted in close proximity. These were also cauterized utilizing the Bugbee electrode. No significant bleeding was encountered. The bladder was then drained. The cystoscope was removed. The patient was  returned to the supine position. He was awakened from general endotracheal anesthesia and was taken to the recovery room in stable condition. There were no problems or complications. The patient tolerated the procedure well.   ____________________________ Denice Bors. Jacqlyn Larsen, MD bsc:drc D: 10/01/2011 21:50:55 ET T: 10/02/2011 08:33:28 ET JOB#: [id number]  cc: Denice Bors. Jacqlyn Larsen, MD, <Dictator> Denice Bors Elisheva Fallas MD ELECTRONICALLY SIGNED 10/08/2011 7:10

## 2015-01-15 NOTE — Consult Note (Signed)
PATIENT NAME:  [name], [name] MR#:  [id number] DATE OF BIRTH:  06-Dec-1940  DATE OF CONSULTATION:  12/12/2011  REFERRING PHYSICIAN:  Dion Body, MD CONSULTING PHYSICIAN:  Heinz Knuckles. Phuong Hillary, MD  REASON FOR CONSULTATION: Bacteria in nephrostomy tube.   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 The following day, he had a temperature of 101. He was admitted for observation and was found to be afebrile and had good nephrostomy output. His hematocrit was stable, and he was discharged on oral antibiotics. He does not recall what antibiotic he was receiving and the History and Physical does not indicate which antibiotics he was receiving. He was admitted on 12/08/2011 with bloody drainage from the nephrostomy tube and shortness of breath. He had a urinalysis performed on the nephrostomy tube fluid on admission which showed 50 mg/dL of glucose, 2+ blood, 30 mg/dL protein, positive nitrites, trace leukocyte esterase, 45,415 red cells and 1,270 white cells. White count on admission was 10.0. On 12/05/2011, his white count was 10.7 with an ANC of 8.8. His white count has remained normal during his hospitalization. His urine culture from the nephrostomy tube however grew greater than 100,000 CFU/mL of Providencia and of enterococcus. He was treated with ceftriaxone and has clinically been doing better. His breathing has been treated with inhaled steroids and bronchodilators. He denies any fevers, chills, or sweats, although he is a very poor historian and could not provide a lot of history.   ALLERGIES: Penicillin. He had a reaction when he was a young child and does not recall the reaction.   PAST MEDICAL HISTORY:  1. Hypertension.  2. Diabetes.   3. Stroke.  4. Chronic renal insufficiency.  5. Prostate cancer.  6. Bladder cancer.  7. Status post bilateral ureteral stent placements.  8. AAA repair.  9. Status post TURP.  10. Nephrostomy tube placement.   SOCIAL HISTORY: The patient lives at home alone. He has no pets. He has smoked a pack of cigarettes per day since he was a teenager. He does not drink. He does not have any injecting drug use history.   FAMILY HISTORY: Positive for diabetes and hypertension.  REVIEW OF SYSTEMS: GENERAL: No fevers, chills, or sweats. Some malaise. HEENT: No headaches. No sinus congestion. No sore throat. NECK: No stiffness. No swollen glands. RESPIRATORY: No cough. No shortness of breath. No sputum production currently. He did have shortness of breath on admission, but this has resolved. CARDIAC: No chest pain or palpitations. GI: No nausea, no vomiting, no abdominal pain, and no change in his bowels. GENITOURINARY: He has had left-sided flank pain and blood in his nephrostomy tube. No other urinary complaints. MUSCULOSKELETAL: No complaints. SKIN: No rashes. NEUROLOGIC: No focal weakness. PSYCHIATRIC: No complaints. All other systems are negative.   PHYSICAL EXAMINATION:   VITAL SIGNS: T-max 98.5, pulse 59, blood pressure 120/70, 96% on room air.   GENERAL: A 74 year old black man in no acute distress.   HEENT: Normocephalic, atraumatic. Pupils equal and reactive to light. Extraocular motion intact. Oropharynx - the patient is missing several teeth. Gums are otherwise in good condition.   NECK: Supple. Full range of motion. Midline trachea. No lymphadenopathy. No thyromegaly.   LUNGS: Clear to auscultation bilaterally with good air movement. No focal consolidation.  HEART: Regular rate and rhythm without murmur, rub, or gallop.   ABDOMEN: Soft, nontender, and nondistended. No hepatosplenomegaly. No CVA tenderness. There was a left-sided nephrostomy tube in place. No hernia is noted.    MUSCULOSKELETAL: No evidence for tenosynovitis.   SKIN: No rashes. No stigmata of endocarditis, specifically no Janeway lesions or Osler nodes.   NEUROLOGIC: The patient was awake and interactive, moving all four extremities.   PSYCHIATRIC: Mood and affect appeared normal. He had somewhat poor memory and could not provide a good history of the events leading up to his hospitalization.   PSYCHIATRIC: Good mood and affect.   LABS/STUDIES: BUN 26, creatinine 2.06, bicarbonate 23, anion gap 12. White count 6.8 with a hemoglobin of 8.8, platelet count of 327, and ANC of 4.2. White count on admission was 10.0. White count on 12/05/2011 was 10.7.   Urine culture from admission is growing greater than 100,000 CFU/mL of Providencia and enterococcus species.   A chest x-ray from admission showed no acute disease of the chest.  V/Q scan from 12/08/2011 demonstrated low probability for PE.   A CT scan of the abdomen and pelvis without contrast showed the left nephrostomy and bilateral double-J ureteral stents noted to be in good anatomic position. There is some bladder wall thickening and the bladder was contracted.   IMPRESSION: A 74 year old black man with a past history significant for diabetes, prostate cancer, and bladder cancer status post nephrostomy tube placement who was admitted with blood in the nephrostomy tube with enterococcus and Providencia species in the urine.   RECOMMENDATIONS:  1. He developed blood in the nephrostomy tube after having it placed a week or so prior to admission. He had some pain on the left, but no significant fever, chills, or sweats. His white count was mildly elevated a few days prior to admission, but had come down to normal by the time he was admitted. His cultures grew two organisms, both in high concentrations, but he has only been treated for the Providencia. The ceftriaxone has no activity against enterococcus.  2. He is clinically doing well. It is  difficult to determine if he had infection. There are no oral options to treat the Providencia and due to his penicillin allergy linezolid would be the only oral option for the enterococcus. Given his improvement without enterococcal coverage, I would not start treatment now.  3. Options for the Providencia include obtaining a PICC line for IV ceftriaxone for another 8 or 9 days, using IM ceftriaxone for that period of time or stopping all therapy and seeing how he does.  4. We will get a stat urinalysis. If this looks okay, then I would plan on stopping his therapy. If he develops further symptoms in the future, would repeat his urinalysis and culture. If he  grew the same organisms, we would have to treat him at that point.   This is a moderately complex infectious disease consultation. The case was discussed with Dr. Dion Body. Thank you very much for involving me in [name] care.   ____________________________ Heinz Knuckles. Vane Yapp, MD meb:slb D: 12/12/2011 12:33:28 ET T: 12/12/2011 12:58:44 ET JOB#: [id number]  cc: Heinz Knuckles. Latecia Miler, MD, <Dictator> Suzann Lazaro E Josette Shimabukuro MD ELECTRONICALLY SIGNED 12/13/2011 8:08

## 2015-01-15 NOTE — H&P (Signed)
PATIENT NAME:  [name], [name] MR#:  [id number] DATE OF BIRTH:  05/30/1941  DATE OF ADMISSION:  12/08/2011  PRIMARY CARE PHYSICIAN: Dr. Netty Starring   ED REFERRING PHYSICIAN: Dr. Benjaman Lobe  CHIEF COMPLAINT: Bloody drainage from recently placed nephrostomy tube as well as shortness of breath.   HISTORY OF PRESENT ILLNESS: Patient is a 74 year old African American male with history of hypertension, diabetes, chronic kidney disease, prostate cancer as well as bladder cancer status post bilateral ureteral stent who has had a left-sided nephrostomy tube which he reports that he is not sure how long he had that for. He had this replaced on Wednesday. Patient post procedure had fevers and had to be hospitalized. He returns back with the main complaint of having bloody drainage from that nephrostomy tube with difficulty of passage of urine. Patient was seen by the ED physician and had this flushed and now the urine has cleared up. He also was complaining of shortness of breath which he reports to me has been going on for months which has progressively gotten worse. Patient just sitting there is dyspneic even when talking. In the ED they did blood work which showed his troponin was slightly elevated. An EKG showed some nonspecific changes therefore I was asked to admit the patient. Patient reports that he has been having a nonproductive cough which has gotten a little worse but has this chronically. He has had progressive shortness of breath which is at rest and activity. He does have limited mobility. He denies any wheezing. Complains of lower extremity swelling. Denies any nocturnal dyspnea or orthopnea. He denies any chest pain or palpitations. No nausea or vomiting. Denies any fevers.   PAST MEDICAL HISTORY:  1. Hypertension.  2. Diabetes, which is borderline. Apparently was treated with metformin in the past. He states that he is still taking a medication; from his last medication list he was on metformin.   3. History of cerebrovascular accident. He is unable to give me any details about this.  4. Chronic kidney disease with his last creatinine on 03/14 was 2.31; prior to that 12/31 his creatinine was 2.0.  5. History of prostate cancer. 6. Bladder cancer.   PAST SURGICAL HISTORY:  1. Status post bilateral ureteral stent.  2. AAA repair.  3. Coil embolization of left hypogastric artery. 4. Transurethral resection of a bladder tumor. 5. Has had nephrostomy tube placed.   ALLERGIES: Penicillin.   MEDICATIONS: Patient is not sure of his medications. He states that he takes a blood pressure pill 3 times a day. The only thing that I have listed here is hydralazine 25, 1 tab p.o. t.i.d. Will have to obtain list of his medications.   SOCIAL HISTORY: Smokes 1 pack per day. He states that he is trying to cut down. No alcohol or drug use. Lives alone.   FAMILY HISTORY: Positive for hypertension, diabetes.    REVIEW OF SYSTEMS: CONSTITUTIONAL: Denies any fevers. No fatigue. No weakness. No pain. No weight loss. No weight gain. EYES: No blurred or double vision. No pain. No redness. No inflammation. No glaucoma. No cataracts. ENT: No tinnitus. No ear pain. No hearing loss. No seasonal or year-round allergies. No epistaxis. No nasal discharge. No snoring. No postnasal drip. No sinus pains. No redness of the oropharynx. No difficulty swallowing. RESPIRATORY: No wheezing. No hemoptysis. No painful respirations. Denies chronic obstructive pulmonary disease. He does complain of nonproductive cough. He complains of dyspnea on exertion and at rest. There is no official diagnosis of  chronic obstructive pulmonary disease. CARDIOVASCULAR: No chest pain. Denies any orthopnea. Does have edema. No arrhythmia. Complains of dyspnea on exertion. No palpitations. No syncope. Does have hypertension. GASTROINTESTINAL: No nausea, vomiting, diarrhea. No abdominal pain. No hematemesis. No melena. No ulcer. No gastroesophageal  reflux disease. No irritable bowel syndrome. No jaundice. GENITOURINARY: Complains of blood from the nephrostomy tube. Denies any dysuria, frequency, or incontinence. ENDO: Denies any polydipsia, nocturia, or thyroid problems. Denies increase in sweating, heat or cold intolerance. HEME/LYMPH: Has chronic anemia. Denies easy bruisability, bleeding, or swollen glands. SKIN: No acne. No rash. No change in mole, hair or skin. MUSCULOSKELETAL: Denies pain in neck, back, or shoulder. No gout. NEUROLOGICAL: No numbness. No weakness. No cerebrovascular accident. No transient ischemic attack. No seizures.    PHYSICAL EXAMINATION:  VITAL SIGNS: Temperature 97.7, pulse 99, respirations 20, blood pressure 137/88, O2 97% on room air.   GENERAL: Patient is a 74 year old African American male who is basically dyspneic even just talking to me at rest.   HEENT: Head atraumatic, normocephalic. Pupils equal, round, reactive to light and accommodation. Extraocular movements intact. Oropharynx without any exudates. He has got multiple teeth that are in very poor condition with very poor oral hygiene. Nasal exam shows no ulceration, drainage. Ear exam shows no erythema or swelling.   CARDIOVASCULAR: Regular rate and rhythm. No murmurs, rubs, clicks, or gallops. PMI is not displaced.   LUNGS: He has got diminished breath sounds without any rales, rhonchi, wheezing. He does have some accessory muscle usage.   ABDOMEN: Soft, nontender, nondistended. Positive bowel sounds x4. He has got a nephrostomy tube one the left which is currently draining urine with some blood in it but the urine is much better, improved prior to his presentation here.  EXTREMITIES: 1+ edema.   SKIN: No rash.   LYMPHATICS: No lymph nodes palpable.   MUSCULOSKELETAL: There is no erythema, swelling.   NEUROLOGIC: Awake, alert, oriented x3. Cranial nerves II through XII grossly intact.   VASCULAR: Good DP, PT pulses.   LABORATORY, DIAGNOSTIC  AND RADIOLOGICAL DATA: Blood glucose 109, BNP 889. BUN 27, creatinine 2.43, sodium 142, potassium 3.7, chloride 106, CO2 23, calcium 9.1. LFTs: Total protein 7.5, albumin 2.9, bilirubin total 0.3, alkaline phosphatase 82, AST 15, ALT 9. CPK 48. CK-MB 0.6. Troponin less than 0.06. WBC 10.0, hemoglobin 9.8, platelet count 338. INR 1. Chest x-ray shows maybe some atelectasis. According to radiology read there no acute abnormality.   ASSESSMENT AND PLAN: Patient is a 74 year old who recently had  nephrostomy tube replaced on Wednesday presents with bloody drainage from nephrostomy tube as well as shortness of breath.  1. Shortness of breath. Patient is dyspneic at rest even with talking. Chest x-ray basically essentially is negative per radiology. His BNP is elevated. He does not have lower extremity swelling. The differential for shortness of breath could be related to possible underlying chronic obstructive pulmonary disease. Also he has a very sedentary lifestyle, also has cancer. At this time I will get a V/Q scan for the morning to rule out PE. Unfortunately d-dimer will be elevated in light of his renal failure as well as his prostate, bladder cancer so will not help so will not check that. Will also check an ABG. I will start him on Combivent and Advair inhaler. He will need outpatient pulmonary evaluation. He also has a cough that is slightly worse, could be acute bronchitis as well. I will put him on p.o. Levaquin.  2. Abnormal EKG, slightly  abnormal cardiac enzymes. This could be just related to his renal failure. At this time due to his shortness of breath I will go ahead and order an echocardiogram, have cardiology evaluate the patient in the a.m. Will not place him on aspirin in light of his hematuria.  3. Acute renal failure on chronic renal failure. Will follow his renal function. In light of the BNP being high, patient being dyspneic I will hold his HCTZ but I will not give him fluid. I will try one  dose of IV Lasix.  4. Diabetes type 2. Will do sliding scale insulin for time being. Obtain home medication list. If patient on metformin this needs to be stopped due to his renal failure.  5. Hypertension. Will continue amlodipine and hydralazine as needed. Will hold HCTZ and lisinopril in light of his renal function.  6. Miscellaneous: Will place him on SCDs for deep vein thrombosis prophylaxis.   TIME SPENT: 35 minutes.   ____________________________ Lafonda Mosses Posey Pronto, MD shp:cms D: 12/08/2011 17:43:07 ET T: 12/09/2011 06:55:15 ET JOB#: [id number]  cc: Merritt Kibby H. Posey Pronto, MD, <Dictator> Dion Body, MD Alric Seton MD ELECTRONICALLY SIGNED 12/11/2011 21:58

## 2015-01-15 NOTE — Consult Note (Signed)
PATIENT NAME:  [name], [name] MR#:  [id number] DATE OF BIRTH:  [birth year]  DATE OF CONSULTATION:  12/08/2011  REFERRING PHYSICIAN:  Dr. Posey Pronto  CONSULTING PHYSICIAN:  Corey Skains, MD  REASON FOR CONSULTATION: Elevated troponin with known cardiovascular risk factors, abdominal aortic aneurysm, diabetes mellitus, hypertension, abnormal EKG.   CHIEF COMPLAINT: "I'm short of breath."   HISTORY OF PRESENT ILLNESS: This is a 74 year old male with hypertension, hyperlipidemia, diabetes mellitus, chronic kidney disease, prostate cancer who has had bilateral urethral stents with left-sided nephrostomy tube which reports he has had for quite some time. The patient has had some fevers and apparent infection for which he has been on the antibiotics. The patient has had new onset significant shortness of breath when he was taken to the Emergency Room finding he had some bilateral pulmonary edema with minimal pleural effusion consistent with diastolic dysfunction, congestive heart failure. EKG has shown normal sinus rhythm with diffuse T wave inversion and there is minimal elevation of troponins to 0.1 more consistent with demand ischemia and renal failure rather than acute myocardial infarction. The patient has had appropriate medications for hypertension control, diabetes mellitus control and does have a history of cerebrovascular accident as well as peripheral vascular disease with abdominal aortic aneurysm repair. Currently he is slightly improved with his symptoms with intravenous Lasix.   REVIEW OF SYSTEMS: Remainder review of systems negative for vision change, ringing in the ears, hearing loss, cough, congestion, heartburn, nausea, vomiting, diarrhea, bloody stool, stomach pain, extremity pain, leg weakness, cramping of the buttocks, known blood clots, headaches, blackouts, bloody nose, palpitations, rhythm disturbances, anxiety, depression, skin lesions, skin rashes.   PAST MEDICAL HISTORY:   1. Hypertension.  2. Diabetes mellitus.  3. Cerebrovascular accident.  4. Chronic kidney disease.  5. Prostate cancer.  6. Bilateral ureteral stents.  7. Abdominal aortic aneurysm repair.   FAMILY HISTORY: Positive for hypertension and diabetes mellitus.   SOCIAL HISTORY: Patient smokes 1 pack per day and denies alcohol use.   ALLERGIES: He has no known drug allergies.   CURRENT MEDICATIONS: As listed.   PHYSICAL EXAMINATION:  VITAL SIGNS: Blood pressure 126/68 bilaterally, heart rate 72 upright, reclining, and regular.   GENERAL: He is a well appearing male in no acute distress.   HEENT: No icterus, thyromegaly, ulcers, hemorrhage, or xanthelasma.   CARDIOVASCULAR: Regular rate and rhythm with normal S1, S2 with a diffuse PMI. Carotid upstroke normal without bruit. Jugular venous pressure normal.   LUNGS: Lungs have bibasilar crackles with decreased basilar breath sounds.   ABDOMEN: Soft, nontender without hepatosplenomegaly or masses. Abdominal aorta is normal size without bruit.   EXTREMITIES: 2+ radial, femoral, trace dorsal pedal pulses with no lower extremity edema, cyanosis, clubbing, ulcers.   NEUROLOGIC: He is oriented to time, place, and person with normal mood and affect.   ASSESSMENT: 74 year old male with recent nephrostomy tube placed with bloody drainage with shortness of breath consistent with diastolic dysfunction congestive heart failure with minimal elevation of troponin more consistent with current illness and renal insufficiency rather than acute myocardial infarction, hypertension, hyperlipidemia, diabetes mellitus with peripheral vascular disease.   RECOMMENDATIONS:  1. Continue serial ECG and enzymes to assess for possible myocardial infarction.  2. Echocardiogram for LV systolic dysfunction, diastolic dysfunction, valvular heart disease contributing to congestive heart failure.  3. Continue treatment of acute renal failure with any other neurologic  procedures as necessary after improvement of congestive heart failure, diastolic dysfunction. 4. Hypertension control with calcium channel blocker but  would hold ACE inhibitor and hydrochlorothiazide due to renal insufficiency. 5. Continue lipid management and diabetes mellitus control with current medical regimen.  6. Further treatment options after ambulation and further intervention at this time.   ____________________________ Corey Skains, MD bjk:cms D: 12/09/2011 08:29:00 ET T: 12/09/2011 10:32:48 ET JOB#: [id number]  cc: Corey Skains, MD, <Dictator> Corey Skains MD ELECTRONICALLY SIGNED 12/10/2011 14:05

## 2015-01-15 NOTE — Consult Note (Signed)
Brief Consult Note: Diagnosis: anemia due to chronic disease, azotemia, iron deficiency , blood loss, malignancy.   Comments: chart reviewed. see dictated note.  1. Seen by me 12/12, not followed, had questionable monoclonal protein in urine, but in dec had normal siep, uiep, 24hrs uiep, and serum light chains.  2. Hx IDA, no GI bleed, had normal colonoscopy in 02/2011, no UGI symptoms  3.Hx bladder cancer, cysto with superficial cancer jan/13, followed by Dr Jacqlyn Larsen. 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 Stable 13mm pulm nodule no change 10/2008 to 07/2011..in a smoker can recheck yearly  6. s/p nephrostomy with bleeding  7. 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   PLAN. WOULD GIVE PO IRON. WOULD CHECK STOOL GUIACS, IF POSITIVE SHOULD HAVE GI W/U . WOULD ASK VASCULAR SURGERY TO CHECK CT PELVIS FROM 10/12 IF NOT ALREADY DONE. WOULD REPEAT NON CONTRAST CT CHEST YEARLY. WOULD DEFER TO DR COPE RE CONTINUED/NEXT HORMONE TX. WOULD TRANSFUSE PRN IF INDICATED BY MEDICINE OR CARDIOLOGY.  DISCUSSED POSSIBLE PROCRIT  PRN WITH PATIENT FOR SYMPTOMATIC ANEMIA, HE DECLINES DUE TO SIDE EFFECT OF POSSIBLE ACCELERATION OF CANCER GROWTH  Electronic Signatures: Dallas Schimke (MD)  (Signed 19-Mar-13 23:06)  Authored: Brief Consult Note   Last Updated: 19-Mar-13 23:06 by Dallas Schimke (MD)

## 2015-01-15 NOTE — Consult Note (Signed)
Impression: 74yo BM w/ h/o DM, prostate CA, bladder CA, s/p nephrostomy admitted with blood in nephrostomy tube with Enterococcus and Providencia spp in the urine.  He developed blood in the nephrostomy after having it replaced a week or so prior to admission.  He had some pain on the left, but no fever, chills or sweats. His WBC was mildly elevated on admission, but has come down to normal.  His culture grew two organisms, both in high concentration, but he has only been treated for the Providencia as ceftriaxone has no activity against Enterococcus. He is clinically doing well.  It is difficult to determine if he had infection.  There are no oral options to treat the Providencia and, due to his PCN allergy, linezolid would be the only oral option for the Enterococcus.  Given his improvement without Enterococcal coverage, I would not start treatment now. Options include obtaining a PICC line for IV ceftriaxone, using IM ceftriaxone for several more days or stopping all therapy and seeing how he does.   Will get a stat urinalysis.  If this looks ok, then I would plan on stopping his therapy.  If he develops further symptoms in the future, would repeat his u/a and culture.  Electronic Signatures: [name], Heinz Knuckles (MD)  (Signed on 21-Mar-13 12:13)  Authored  Last Updated: 21-Mar-13 12:13 by Cornell Bourbon, Heinz Knuckles (MD)

## 2015-01-18 ENCOUNTER — Inpatient Hospital Stay
Admit: 2015-01-18 | Discharge: 2015-02-04 | DRG: 871 | Disposition: A | Discharge status: Skilled Nursing Facility | Payer: Medicare Other | Source: Ambulatory Visit | Attending: Internal Medicine | Admitting: Internal Medicine

## 2015-01-18 ENCOUNTER — Other Ambulatory Visit: Payer: Self-pay | Admitting: Physician Assistant

## 2015-01-18 DIAGNOSIS — Z992 Dependence on renal dialysis: Secondary | ICD-10-CM

## 2015-01-18 DIAGNOSIS — I480 Paroxysmal atrial fibrillation: Secondary | ICD-10-CM | POA: Diagnosis present

## 2015-01-18 DIAGNOSIS — I340 Nonrheumatic mitral (valve) insufficiency: Secondary | ICD-10-CM | POA: Diagnosis not present

## 2015-01-18 DIAGNOSIS — N1339 Other hydronephrosis: Secondary | ICD-10-CM | POA: Diagnosis present

## 2015-01-18 DIAGNOSIS — K921 Melena: Secondary | ICD-10-CM | POA: Diagnosis present

## 2015-01-18 DIAGNOSIS — C61 Malignant neoplasm of prostate: Secondary | ICD-10-CM | POA: Diagnosis present

## 2015-01-18 DIAGNOSIS — J9601 Acute respiratory failure with hypoxia: Secondary | ICD-10-CM | POA: Diagnosis present

## 2015-01-18 DIAGNOSIS — R6521 Severe sepsis with septic shock: Secondary | ICD-10-CM | POA: Diagnosis not present

## 2015-01-18 DIAGNOSIS — Z8673 Personal history of transient ischemic attack (TIA), and cerebral infarction without residual deficits: Secondary | ICD-10-CM

## 2015-01-18 DIAGNOSIS — N390 Urinary tract infection, site not specified: Secondary | ICD-10-CM | POA: Diagnosis present

## 2015-01-18 DIAGNOSIS — A419 Sepsis, unspecified organism: Secondary | ICD-10-CM | POA: Diagnosis present

## 2015-01-18 DIAGNOSIS — C679 Malignant neoplasm of bladder, unspecified: Secondary | ICD-10-CM | POA: Diagnosis present

## 2015-01-18 DIAGNOSIS — N186 End stage renal disease: Secondary | ICD-10-CM | POA: Diagnosis present

## 2015-01-18 DIAGNOSIS — G9341 Metabolic encephalopathy: Secondary | ICD-10-CM | POA: Diagnosis present

## 2015-01-18 DIAGNOSIS — A4189 Other specified sepsis: Principal | ICD-10-CM | POA: Diagnosis present

## 2015-01-18 DIAGNOSIS — I4891 Unspecified atrial fibrillation: Secondary | ICD-10-CM | POA: Diagnosis not present

## 2015-01-18 DIAGNOSIS — Z87440 Personal history of urinary (tract) infections: Secondary | ICD-10-CM

## 2015-01-18 DIAGNOSIS — I255 Ischemic cardiomyopathy: Secondary | ICD-10-CM | POA: Diagnosis present

## 2015-01-18 DIAGNOSIS — R140 Abdominal distension (gaseous): Secondary | ICD-10-CM

## 2015-01-18 DIAGNOSIS — I120 Hypertensive chronic kidney disease with stage 5 chronic kidney disease or end stage renal disease: Secondary | ICD-10-CM | POA: Diagnosis present

## 2015-01-18 DIAGNOSIS — K560 Paralytic ileus: Secondary | ICD-10-CM | POA: Diagnosis present

## 2015-01-18 DIAGNOSIS — K567 Ileus, unspecified: Secondary | ICD-10-CM | POA: Insufficient documentation

## 2015-01-18 DIAGNOSIS — D631 Anemia in chronic kidney disease: Secondary | ICD-10-CM | POA: Diagnosis present

## 2015-01-18 DIAGNOSIS — I5042 Chronic combined systolic (congestive) and diastolic (congestive) heart failure: Secondary | ICD-10-CM | POA: Diagnosis not present

## 2015-01-18 DIAGNOSIS — Z960 Presence of urogenital implants: Secondary | ICD-10-CM | POA: Diagnosis present

## 2015-01-18 DIAGNOSIS — N1330 Unspecified hydronephrosis: Secondary | ICD-10-CM

## 2015-01-18 DIAGNOSIS — J90 Pleural effusion, not elsewhere classified: Secondary | ICD-10-CM

## 2015-01-18 DIAGNOSIS — D649 Anemia, unspecified: Secondary | ICD-10-CM | POA: Diagnosis present

## 2015-01-18 DIAGNOSIS — N2581 Secondary hyperparathyroidism of renal origin: Secondary | ICD-10-CM | POA: Diagnosis present

## 2015-01-18 DIAGNOSIS — I313 Pericardial effusion (noninflammatory): Secondary | ICD-10-CM | POA: Diagnosis not present

## 2015-01-18 DIAGNOSIS — F17210 Nicotine dependence, cigarettes, uncomplicated: Secondary | ICD-10-CM | POA: Diagnosis present

## 2015-01-18 DIAGNOSIS — I5043 Acute on chronic combined systolic (congestive) and diastolic (congestive) heart failure: Secondary | ICD-10-CM | POA: Diagnosis present

## 2015-01-18 DIAGNOSIS — Z8719 Personal history of other diseases of the digestive system: Secondary | ICD-10-CM

## 2015-01-18 DIAGNOSIS — I959 Hypotension, unspecified: Secondary | ICD-10-CM | POA: Diagnosis not present

## 2015-01-18 DIAGNOSIS — J189 Pneumonia, unspecified organism: Secondary | ICD-10-CM

## 2015-01-18 DIAGNOSIS — R0602 Shortness of breath: Secondary | ICD-10-CM

## 2015-01-18 HISTORY — DX: Personal history of other diseases of the digestive system: Z87.19

## 2015-01-18 HISTORY — DX: Personal history of other infectious and parasitic diseases: Z86.19

## 2015-01-18 HISTORY — DX: End stage renal disease: N18.6

## 2015-01-18 HISTORY — DX: Chronic combined systolic (congestive) and diastolic (congestive) heart failure: I50.42

## 2015-01-18 HISTORY — DX: Paroxysmal atrial fibrillation: I48.0

## 2015-01-18 HISTORY — DX: End stage renal disease: Z99.2

## 2015-01-18 HISTORY — DX: Anemia, unspecified: D64.9

## 2015-01-18 LAB — CK-MB
CK-MB: 2.1 ng/mL
CK-MB: 2.4 ng/mL

## 2015-01-18 LAB — PROTIME-INR
INR: 1.3
Prothrombin Time: 16.1 secs — ABNORMAL HIGH

## 2015-01-18 LAB — LACTIC ACID, PLASMA: Lactic Acid, Venous: 3.8 mmol/L

## 2015-01-18 LAB — BASIC METABOLIC PANEL
Anion Gap: 17 — ABNORMAL HIGH (ref 7–16)
BUN: 37 mg/dL — ABNORMAL HIGH
CREATININE: 7.94 mg/dL — AB
Calcium, Total: 8.1 mg/dL — ABNORMAL LOW
Chloride: 96 mmol/L — ABNORMAL LOW
Co2: 27 mmol/L
EGFR (African American): 7 — ABNORMAL LOW
GFR CALC NON AF AMER: 6 — AB
GLUCOSE: 213 mg/dL — AB
Potassium: 3.3 mmol/L — ABNORMAL LOW
Sodium: 140 mmol/L

## 2015-01-18 LAB — CBC
HCT: 22.3 % — ABNORMAL LOW (ref 40.0–52.0)
HGB: 6.9 g/dL — AB (ref 13.0–18.0)
MCH: 28.2 pg (ref 26.0–34.0)
MCHC: 31.1 g/dL — ABNORMAL LOW (ref 32.0–36.0)
MCV: 91 fL (ref 80–100)
Platelet: 197 10*3/uL (ref 150–440)
RBC: 2.46 10*6/uL — ABNORMAL LOW (ref 4.40–5.90)
RDW: 19.1 % — ABNORMAL HIGH (ref 11.5–14.5)
WBC: 14 10*3/uL — ABNORMAL HIGH (ref 3.8–10.6)

## 2015-01-18 LAB — URINALYSIS, COMPLETE
Specific Gravity: 1.027 (ref 1.003–1.030)
Squamous Epithelial: NONE SEEN

## 2015-01-18 LAB — TROPONIN I: Troponin-I: 0.33 ng/mL — ABNORMAL HIGH

## 2015-01-18 LAB — APTT: Activated PTT: 33.7 secs (ref 23.6–35.9)

## 2015-01-18 LAB — PRO B NATRIURETIC PEPTIDE: B-Type Natriuretic Peptide: 1518 pg/mL — ABNORMAL HIGH

## 2015-01-18 LAB — MAGNESIUM: Magnesium: 1.8 mg/dL

## 2015-01-18 LAB — PHOSPHORUS: Phosphorus: 3 mg/dL

## 2015-01-19 DIAGNOSIS — I340 Nonrheumatic mitral (valve) insufficiency: Secondary | ICD-10-CM

## 2015-01-19 DIAGNOSIS — N186 End stage renal disease: Secondary | ICD-10-CM

## 2015-01-19 DIAGNOSIS — I4891 Unspecified atrial fibrillation: Secondary | ICD-10-CM

## 2015-01-19 DIAGNOSIS — R6521 Severe sepsis with septic shock: Secondary | ICD-10-CM

## 2015-01-19 DIAGNOSIS — I959 Hypotension, unspecified: Secondary | ICD-10-CM

## 2015-01-19 LAB — CBC WITH DIFFERENTIAL/PLATELET
BASOS PCT: 0.3 %
Basophil #: 0 10*3/uL (ref 0.0–0.1)
EOS PCT: 1 %
Eosinophil #: 0.1 10*3/uL (ref 0.0–0.7)
HCT: 22.3 % — AB (ref 40.0–52.0)
HGB: 7.3 g/dL — ABNORMAL LOW (ref 13.0–18.0)
LYMPHS PCT: 13.3 %
Lymphocyte #: 1.9 10*3/uL (ref 1.0–3.6)
MCH: 29 pg (ref 26.0–34.0)
MCHC: 32.7 g/dL (ref 32.0–36.0)
MCV: 89 fL (ref 80–100)
Monocyte #: 1.6 x10 3/mm — ABNORMAL HIGH (ref 0.2–1.0)
Monocyte %: 11.3 %
NEUTROS ABS: 10.4 10*3/uL — AB (ref 1.4–6.5)
NEUTROS PCT: 74.1 %
Platelet: 208 10*3/uL (ref 150–440)
RBC: 2.51 10*6/uL — AB (ref 4.40–5.90)
RDW: 18.5 % — ABNORMAL HIGH (ref 11.5–14.5)
WBC: 14.1 10*3/uL — ABNORMAL HIGH (ref 3.8–10.6)

## 2015-01-19 LAB — CK-MB: CK-MB: 3.8 ng/mL

## 2015-01-19 LAB — BASIC METABOLIC PANEL
ANION GAP: 12 (ref 7–16)
BUN: 41 mg/dL — ABNORMAL HIGH
CALCIUM: 7.9 mg/dL — AB
Chloride: 99 mmol/L — ABNORMAL LOW
Co2: 27 mmol/L
Creatinine: 8.24 mg/dL — ABNORMAL HIGH
EGFR (African American): 7 — ABNORMAL LOW
EGFR (Non-African Amer.): 6 — ABNORMAL LOW
Glucose: 133 mg/dL — ABNORMAL HIGH
Potassium: 3.5 mmol/L
Sodium: 138 mmol/L

## 2015-01-19 LAB — PHOSPHORUS: PHOSPHORUS: 4.4 mg/dL

## 2015-01-19 LAB — LACTIC ACID, PLASMA: Lactic Acid, Venous: 1.7 mmol/L

## 2015-01-19 LAB — TSH: THYROID STIMULATING HORM: 1.816 u[IU]/mL

## 2015-01-20 DIAGNOSIS — I4891 Unspecified atrial fibrillation: Secondary | ICD-10-CM

## 2015-01-20 LAB — URINALYSIS, COMPLETE
Bilirubin,UR: NEGATIVE
Glucose,UR: NEGATIVE mg/dL (ref 0–75)
Ketone: NEGATIVE
Nitrite: NEGATIVE
Ph: 7 (ref 4.5–8.0)
Specific Gravity: 1.011 (ref 1.003–1.030)
Squamous Epithelial: NONE SEEN

## 2015-01-20 LAB — CBC WITH DIFFERENTIAL/PLATELET
BASOS ABS: 0 10*3/uL (ref 0.0–0.1)
Basophil %: 0.2 %
EOS PCT: 1.7 %
Eosinophil #: 0.2 10*3/uL (ref 0.0–0.7)
HCT: 21.5 % — ABNORMAL LOW (ref 40.0–52.0)
HGB: 6.9 g/dL — ABNORMAL LOW (ref 13.0–18.0)
Lymphocyte #: 1.1 10*3/uL (ref 1.0–3.6)
Lymphocyte %: 9 %
MCH: 28.2 pg (ref 26.0–34.0)
MCHC: 31.9 g/dL — ABNORMAL LOW (ref 32.0–36.0)
MCV: 89 fL (ref 80–100)
MONOS PCT: 7.8 %
Monocyte #: 0.9 x10 3/mm (ref 0.2–1.0)
NEUTROS PCT: 81.3 %
Neutrophil #: 9.5 10*3/uL — ABNORMAL HIGH (ref 1.4–6.5)
PLATELETS: 176 10*3/uL (ref 150–440)
RBC: 2.43 10*6/uL — AB (ref 4.40–5.90)
RDW: 18.6 % — AB (ref 11.5–14.5)
WBC: 11.7 10*3/uL — AB (ref 3.8–10.6)

## 2015-01-20 LAB — BASIC METABOLIC PANEL
Anion Gap: 12 (ref 7–16)
BUN: 32 mg/dL — ABNORMAL HIGH
CO2: 30 mmol/L
CREATININE: 6.73 mg/dL — AB
Calcium, Total: 7.8 mg/dL — ABNORMAL LOW
Chloride: 99 mmol/L — ABNORMAL LOW
EGFR (African American): 9 — ABNORMAL LOW
GFR CALC NON AF AMER: 7 — AB
GLUCOSE: 119 mg/dL — AB
Potassium: 3.6 mmol/L
SODIUM: 141 mmol/L

## 2015-01-20 LAB — LIPID PANEL
Cholesterol: 68 mg/dL
HDL Cholesterol: 6 mg/dL — ABNORMAL LOW
LDL CHOLESTEROL, CALC: 29 mg/dL
Triglycerides: 167 mg/dL — ABNORMAL HIGH
VLDL Cholesterol, Calc: 33 mg/dL

## 2015-01-20 LAB — HEMOGLOBIN A1C: HEMOGLOBIN A1C: 5.9 %

## 2015-01-20 LAB — URINE CULTURE

## 2015-01-20 LAB — CULTURE, BLOOD (SINGLE)

## 2015-01-21 DIAGNOSIS — I4891 Unspecified atrial fibrillation: Secondary | ICD-10-CM | POA: Diagnosis present

## 2015-01-21 LAB — PHOSPHORUS: Phosphorus: 4.7 mg/dL — ABNORMAL HIGH

## 2015-01-21 MED ORDER — SODIUM CHLORIDE 0.9 % IJ SOLN: 5.0000 mL | INTRAMUSCULAR | Status: DC | PRN

## 2015-01-21 MED ORDER — AMIODARONE HCL 200 MG PO TABS: 200.0000 mg | ORAL_TABLET | Freq: Every day | ORAL | Status: DC

## 2015-01-21 MED ORDER — SODIUM CHLORIDE 0.9 % IJ SOLN: 5.0000 mL | Freq: Two times a day (BID) | INTRAMUSCULAR | Status: DC

## 2015-01-21 MED ORDER — SODIUM CHLORIDE 0.9 % IJ SOLN: 3.0000 mL | INTRAMUSCULAR | Status: DC | PRN

## 2015-01-21 MED ORDER — SODIUM CHLORIDE 0.9 % IJ SOLN
3.0000 mL | INTRAMUSCULAR | Status: DC | PRN
Start: 2015-01-22 — End: 2015-02-04

## 2015-01-21 MED ORDER — PHENOL 1.4 % MT LIQD
1.0000 | OROMUCOSAL | Status: DC | PRN
  Administered 2015-01-22 – 2015-01-30 (×13): 1 via OROMUCOSAL
  Filled 2015-01-21: qty 177

## 2015-01-21 MED ORDER — VANCOMYCIN HCL IN DEXTROSE 1-5 GM/200ML-% IV SOLN
1000.0000 mg | INTRAVENOUS | Status: DC
  Administered 2015-01-24: 1000 mg via INTRAVENOUS
  Filled 2015-01-21 (×2): qty 200

## 2015-01-21 MED ORDER — SODIUM CHLORIDE 0.9 % IJ SOLN
5.0000 mL | Freq: Two times a day (BID) | INTRAMUSCULAR | Status: DC
  Administered 2015-01-22 – 2015-01-23 (×3): 5 mL via INTRAVENOUS
  Administered 2015-01-23: 10 mL via INTRAVENOUS
  Administered 2015-01-24 – 2015-01-25 (×2): 5 mL via INTRAVENOUS
  Filled 2015-01-21: qty 10

## 2015-01-21 MED ORDER — SODIUM CHLORIDE 0.9 % IV SOLN
INTRAVENOUS | Status: DC
  Administered 2015-01-26: 07:00:00 via INTRAVENOUS

## 2015-01-21 MED ORDER — SODIUM CHLORIDE 0.9 % IV SOLN
1.0000 g | INTRAVENOUS | Status: DC
  Filled 2015-01-21: qty 1

## 2015-01-21 MED ORDER — ACETAMINOPHEN 325 MG PO TABS: 650.0000 mg | ORAL_TABLET | Freq: Four times a day (QID) | ORAL | Status: DC | PRN

## 2015-01-21 MED ORDER — VANCOMYCIN HCL IN DEXTROSE 1-5 GM/200ML-% IV SOLN
1000.0000 mg | INTRAVENOUS | Status: DC | PRN
  Filled 2015-01-21: qty 200

## 2015-01-21 MED ORDER — AMIODARONE HCL 200 MG PO TABS: 200.0000 mg | ORAL_TABLET | Freq: Two times a day (BID) | ORAL | Status: DC

## 2015-01-21 MED ORDER — ONDANSETRON HCL 4 MG/2ML IJ SOLN: 4.0000 mg | INTRAMUSCULAR | Status: DC | PRN

## 2015-01-21 MED ORDER — AMIODARONE HCL 200 MG PO TABS
400.0000 mg | ORAL_TABLET | Freq: Two times a day (BID) | ORAL | Status: DC
  Administered 2015-01-22 – 2015-01-26 (×9): 400 mg via ORAL
  Filled 2015-01-21 (×9): qty 2

## 2015-01-21 MED ORDER — ATORVASTATIN CALCIUM 20 MG PO TABS
40.0000 mg | ORAL_TABLET | Freq: Every day | ORAL | Status: DC
  Administered 2015-01-22 – 2015-02-03 (×13): 40 mg via ORAL
  Filled 2015-01-21 (×14): qty 2

## 2015-01-22 ENCOUNTER — Inpatient Hospital Stay: Payer: Medicare Other

## 2015-01-22 LAB — BASIC METABOLIC PANEL
Anion gap: 13 (ref 5–15)
BUN: 38 mg/dL — AB (ref 6–20)
CHLORIDE: 98 mmol/L — AB (ref 101–111)
CO2: 31 mmol/L (ref 22–32)
CREATININE: 8.36 mg/dL — AB (ref 0.61–1.24)
Calcium: 7.9 mg/dL — ABNORMAL LOW (ref 8.9–10.3)
GFR calc non Af Amer: 6 mL/min — ABNORMAL LOW (ref >60)
GFR, EST AFRICAN AMERICAN: 6 mL/min — AB (ref >60)
Glucose, Bld: 94 mg/dL (ref 65–99)
POTASSIUM: 3.9 mmol/L (ref 3.5–5.1)
Sodium: 142 mmol/L (ref 135–145)

## 2015-01-22 LAB — CBC
HCT: 22 % — ABNORMAL LOW (ref 40.0–52.0)
HEMOGLOBIN: 7 g/dL — AB (ref 13.0–18.0)
MCH: 28.4 pg (ref 26.0–34.0)
MCHC: 31.9 g/dL — AB (ref 32.0–36.0)
MCV: 89 fL (ref 80.0–100.0)
Platelets: 220 10*3/uL (ref 150–440)
RBC: 2.48 MIL/uL — AB (ref 4.40–5.90)
RDW: 18.8 % — ABNORMAL HIGH (ref 11.5–14.5)
WBC: 12.9 10*3/uL — ABNORMAL HIGH (ref 3.8–10.6)

## 2015-01-22 LAB — PHOSPHORUS: Phosphorus: 5.3 mg/dL — ABNORMAL HIGH (ref 2.5–4.6)

## 2015-01-22 MED ORDER — SODIUM CHLORIDE 0.9 % IV SOLN
500.0000 mg | INTRAVENOUS | Status: DC
  Administered 2015-01-22 – 2015-01-23 (×2): 500 mg via INTRAVENOUS
  Filled 2015-01-22 (×3): qty 0.5

## 2015-01-22 MED ORDER — ACETAMINOPHEN 325 MG PO TABS
650.0000 mg | ORAL_TABLET | ORAL | Status: DC | PRN
  Administered 2015-02-02 – 2015-02-04 (×3): 650 mg via ORAL
  Filled 2015-01-22 (×3): qty 2

## 2015-01-22 NOTE — Progress Notes (Signed)
Previous documentation in SCM, [id number], Boris Sharper

## 2015-01-22 NOTE — H&P (Addendum)
PATIENT NAME:  [name], [name] MR#:  [id number] DATE OF BIRTH:  [birth year]  DATE OF ADMISSION:  01/18/2015  PRIMARY PHYSICIAN: Dion Body, MD  EMERGENCY ROOM PHYSICIAN: Larae Grooms, MD   CHIEF COMPLAINT: Respiratory distress.  HISTORY OF PRESENT ILLNESS: The patient is a 74 year old male patient brought in by EMS because of respiratory distress. The patient's niece called EMS because he was having shortness of breath and she noted that he was having respiratory distress and he was not acting right. The patient, upon coming to the Emergency Room, was in atrial fibrillation with RVR, heart rate 163 beats per minute, and he also was hypotensive with blood pressure 62/50. The patient was very agitated in the Emergency Room and unable to tell me any history. History obtained from ER nurse and family. The patient received 3 liters of normal saline, 1 unit of packed RBC, and blood pressure is still around 98/66, heart rate 143 beats per minute. Seen by Dr. Ellyn Hack from [place] in consultation because of atrial fibrillation with RVR, altered mental status, and hypotension. The patient seen by them. They recommended IV amiodarone drip and workup for sepsis. The cardiologist discussed with the family about chemical cardioversion and direct cardioversion and we have decided on looking for chemical cardioversion and also treating his sepsis. The patient noted to have anemia, hemoglobin 6.9 and BNP 1518. The patient right now on IV amiodarone drip. He is in septic shock with hypotension, elevated lactic acid of 3.8, and has very turbid milky urine obtained from Foley catheter. The patient unable to give any history at this time, slightly confused and very restless, saying that he has pain in the groin.   PAST MEDICAL HISTORY: According to his old charts, he has a history of bladder cancer, follows up with Dr. Jacqlyn Larsen, has a history of bilateral ureteral obstruction with hydronephrosis and  stents placed bilaterally. He has history of prostate cancer and BCG injections were given before and he also was on Lupron injections. He has ESRD on hemodialysis Tuesday, Thursday, and Saturday schedule and has been on hemodialysis for the past 6 to 7 months. He has a history of hypertension and abdominal aortic aneurysm and iliac aneurysm, status post repair. He also has a history of neurogenic bladder, anemia of chronic kidney disease. Past medical history also includes chronic pyuria and bacteriuria.    ALLERGIES: HE IS ALLERGIC TO PENICILLIN.   SOCIAL HISTORY: Lives alone and smokes about 1/2 pack a of cigarettes a day. No alcohol. No drugs.   FAMILY HISTORY: The patient's mother died of old age and he know about his father.   PAST SURGICAL HISTORY: Significant for history of stent placement, and bilateral hydronephrosis with stent placement, and AV fistula placement.   REVIEW OF SYSTEMS: Not obtainable because he is slightly confused, unable to tell me any history. He is very agitated and does not want to give me any history.   HOME MEDICATIONS: Amlodipine 5 mg daily, loperamide 2 mg p.o. every 4 hours, magnesium oxide 400 mg p.o. daily, Renvela 800 mg 2 tablets p.o. t.i.d. Spiriva Respimat 2.5 mcg inhalation daily, Symbicort 160/4.5 mcg 2 puffs b.i.d.   PHYSICAL EXAMINATION:  VITAL SIGNS: Temperature 98.6 Fahrenheit, heart rate 163, blood pressure 62/50. The patient's repeat blood pressure improved to 99/67, heart rate 143, amiodarone drip has been stopped for getting central line access. The plan is to restart the amiodarone drip.  GENERAL: The patient is awake but agitated, complains of some pain  in the suprapubic area. He is a 74 year old, well-developed, well-nourished male  HEAD: Atraumatic, normocephalic.  EYES: Pupils equally reacting to light. No scleral icterus. Extraocular movements intact.  NOSE: No nasal lesions. No drainage.  EARS: No drainage. No external lesions. MOUTH:  Clinically dry. No lesions. No exudates.  NECK: Supple. No JVD, symmetric. No masses. Thyroid in the midline, not enlarged.   RESPIRATORY: Good respiratory effort. Clear to auscultation. No wheeze. No rales.  CARDIOVASCULAR: S1, S2 irregularly irregular, in atrial fibrillation with RVR. The patient has no peripheral edema. Unable to check pulses because of atrial fibrillation. ABDOMEN: Soft, nontender, nondistended. No hernias.  MUSCULOSKELETAL: Extremities move x4.  SKIN: Inspection is normal. Decreased skin turgor.  NEUROLOGIC: Agitated, but no gross focal neurological deficits. No facial droop. No slurred speech. Able to move all extremities. PSYCHIATRIC: Slightly agitated.  GENITOURINARY: The patient has a Foley inserted in the ER, does have very thick pus obtained from the Foley and has significant pyuria.   LABORATORY DATA: PH 7.530, pCO2 of 31, pO2 of 59. The patient's FiO2 was 95.1; this is on nasal cannula 2 liters. The patient's CT angiogram chest, abdomen and pelvis without contrast shows no evidence of aortic dissection or rupture, abdominal aortic aneurysm, no acute vascular occlusion, dilatation, or descending thoracic aorta. The patient's infrarenal abdominal aortic aneurysm is occluded. The patient has bilateral significant hydronephrosis with bilateral ureteral stents, which appear well positioned. The degree of hydronephrosis and ureteral stents are stable from prior CT, has mild bladder wall thickening consistent with cystitis. The patient's chest examination shows moderate cardiomegaly, dependent atelectasis. No pneumonia. No pulmonary edema.   Troponins are 0.33. WBC 14, hemoglobin 6.9, hematocrit 22.3, platelets 197,000. The patient's sodium 140, potassium 3.3, chloride 96, bicarbonate 27, BUN 37, creatinine 7.94, glucose 213. BNP 1518. CPK-MB 2.1. Lactic acid is 3.8. Urine is green, turbid, too many to count rbc's, wbc's and bacteria.    EKG shows atrial fibrillation with RVR,  140 to 150 beats per minute.   ASSESSMENT AND PLAN:  1.  This patient is a 74 year old male patient with atrial fibrillation with rapid ventricular response with hypotension. The patient has atrial fibrillation with rapid ventricular response likely secondary to underlying septic shock. The patient is seen by Dr. Ellyn Hack for atrial fibrillation with rapid ventricular response and he is getting the chemical cardioversion with amiodarone IV. Continue that, and he already received 3 liters of normal saline IV fluids, so cardiology recommends amiodarone drip without bolus and see how he does.  2.  Septic shock with white count elevation, lactic acid, tachycardia. The patient has septic shock with possible source of urinary tract infection. Continue broad-spectrum antibiotics. He has already received Azactam and also vancomycin. We are going to continue vancomycin and Azactam, and obtain ID consult with Dr. Ola Spurr. He does have history of chronic pyuria. He has been followed by Dr. Jacqlyn Larsen regarding his bladder cancer and bilateral hydronephrosis. His stents are well positioned. We will request Dr. Jacqlyn Larsen or his partner to see him while in the hospital to evaluate, and regarding urinary tract infection follow with the cultures and narrow down the antibiotics as appropriate.  3.  Atrial fibrillation with rapid ventricular rate. Continue amiodarone drip.  4.  History of end-stage renal disease, on hemodialysis. Consult nephrology on Tuesday, Thursday, Saturday. Right now, he has hypotension, he may need CRRT.  5.  Hypoxia secondary to his septic shock. Right now, he is mentating well. Monitor closely and see if he needs any  intubation. He is very critical at this time.  6.  He has anemia. Hemoglobin dropped to 6.9. His hemoglobin was 8.9 in November, so he got 1 unit of transfusion ordered and check the hemoglobin after transfusion.  TIME SPENT: Sixty minutes.  He is admitted to ICU at this time.     ____________________________ Epifanio Lesches, MD sk:TM D: 01/18/2015 20:51:17 ET T: 01/18/2015 21:29:47 ET JOB#: [id number]  cc: Epifanio Lesches, MD, <Dictator> Epifanio Lesches MD ELECTRONICALLY SIGNED 01/24/2015 23:37

## 2015-01-22 NOTE — Consult Note (Addendum)
PATIENT NAME:  [name], CHERRY MR#:  [id number] DATE OF BIRTH:  1941-03-16  DATE OF CONSULTATION:  01/19/2015  REFERRING PHYSICIAN:  Srikar R. Sudini, MD  CONSULTING PHYSICIAN:  Cheral Marker. Ola Spurr, MD  REASON FOR CONSULTATION: Urinary tract infection.   HISTORY OF PRESENT ILLNESS: This is a pleasant 74 year old gentleman on dialysis, who was admitted with sepsis from urinary tract infection and atrial fibrillation. He has a long history including prostate cancer and chronic bilateral hydronephrosis. He has followed with Dr. Jacqlyn Larsen and has bilateral stents in place. He receives Lupron shots for his prostate cancer. In the past, he has had bladder cancer treated with BCG. He is on dialysis and follows with Dr. Holley Raring for this. He has a long history of multiple urinary tract infections and was seen emergently back in October. He is a relatively poor historian but reports that he is feeling a little better. He has not seen Dr. Jacqlyn Larsen in some while and apparently is following with [facility] at this point.   PAST MEDICAL HISTORY: 1.  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  2.  Prostate cancer treated with possible prostatectomy, as well as Lupron.  3.  Hypertension.  4.  Neurogenic bladder with a history of bladder cancer, followed by Dr. Jacqlyn Larsen.  5.  Bilateral urethral obstruction and hydronephrosis with stent placement bilaterally.  6.  AAA and iliac aneurysm repair.  7.  Diet-controlled diabetes.   PAST SURGICAL HISTORY: Placement of AV fistula. He has bilateral urethral stent placement, possible pancreatectomy, iliac aneurysm repair.   SOCIAL HISTORY: Lives alone, smokes 1/2 pack a day, occasional alcohol.   FAMILY HISTORY: Noncontributory.   REVIEW OF SYSTEMS: Eleven systems reviewed and negative except as per HPI.   ALLERGIES: He is listed as being allergic to penicillin.    PHYSICAL EXAMINATION: VITAL SIGNS: Temperature 98.6, pulse 109, blood  pressure 92/67, respirations 28, sat 100% on 4 liters.  GENERAL: He is pleasant, chronically ill-appearing, in no acute distress.  HEENT: Pupils are reactive. Sclerae are anicteric. His oropharynx is dry. He has a right neck triple lumen catheter.  HEART: Tachycardic but regular.  LUNGS: Coarse breath sounds bilaterally.  ABDOMEN: Soft, nontender, nondistended. No hepatosplenomegaly.  GENITOURINARY: He has a Foley catheter in place. There is a lot of thick cloudy material from it.  EXTREMITIES: No clubbing, cyanosis or edema.  NEUROLOGIC: He is groggy and somewhat lethargic but able to answer questions, able to move all 4 extremities.   LABORATORY DATA: Urinalysis on admission April 27 with too numerous to count white cells, too numerous to count red cells, white blood cell clumps present, many bacteria. Urine culture from April 27 is no growth to date, as are blood cultures April 27. White blood count April 27 was 14.0, currently 14.1, hemoglobin 7.3, platelets 208,000. Renal function is consistent with end-stage renal disease. TSH was 1.8.   IMAGING: Chest x-ray showed tip of the right IJ in the SVC. No evidence of pneumothorax. There is cardiomegaly. There is pulmonary vascular congestion. CT of the abdomen, chest, and pelvis shows no evidence of aortic dissection. There is no acute vascular occlusion, dilation of the descending thoracic aorta. There is excluded infrarenal abdominal aortic aneurysm, which is widely patent. 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 There is bilateral significant hydronephrosis with bilateral urethral stents, which appear well  positioned. The degree of hydronephrosis and the urethral stents are stable from the prior  CT. There is mild bladder wall thickening. Consider cystitis in the proper clinical setting. No evidence of pneumonia in the chest.   IMPRESSION: A 74 year old gentleman with multiple  medical problems, on dialysis, chronic obstructive nephropathy, as well as bilateral urethral stents in place. He has recurrent history of multiple urinary tract infections in the past with Enterobacter. He also had viridans strep bacteremia in October. He has quite purulent urine in his Foley catheter. He clinically seems somewhat improved on his current antibiotic regimen of meropenem and vancomycin.   RECOMMENDATIONS: 1.  Continue current antibiotics pending urine culture. 2.  Agree with urological evaluation. He has followed with Dr. Jacqlyn Larsen in the past, but apparently is not following with [facility].  3.  Further recommendations based on culture results.   Thank you for the consultation. I will be glad to follow with you.    ____________________________ Cheral Marker. Ola Spurr, MD dpf:at D: 01/19/2015 16:23:02 ET T: 01/19/2015 17:35:28 ET JOB#: [id number]  cc: Cheral Marker. Ola Spurr, MD, <Dictator> Keiyana Stehr Ola Spurr MD ELECTRONICALLY SIGNED 01/24/2015 10:25

## 2015-01-22 NOTE — Progress Notes (Signed)
Subjective: Pt moved to floor care. Now has NG in place.  Last had HD yesterday.  Overall improving.  Objective: Vital signs in last 24 hours: Temp:  [98.3 F (36.8 C)] 98.3 F (36.8 C) (05/01 0802) Pulse Rate:  [76] 76 (05/01 0802) Resp:  [18] 18 (05/01 0802) BP: (99)/(54) 99/54 mmHg (05/01 0802) SpO2:  [100 %] 100 % (05/01 0802) Weight:  [101 kg (222 lb 10.6 oz)] 101 kg (222 lb 10.6 oz) (05/01 0500) Weight change: -0.8 kg (-1 lb 12.2 oz)  Intake/Output from previous day:  Intake/Output Summary (Last 24 hours) at 01/22/15 1528 Last data filed at 01/22/15 1200  Gross per 24 hour  Intake    177 ml  Output    350 ml  Net   -173 ml    Intake/Output this shift: Total I/O In: 48 [I.V.:48] Out: 50 [Urine:50]  General appearance: NAD HEENT: Atraumatic; EOMI, NG in place, OM moist Neck: Trachea midline; supple Lungs: CTAB, with normal respiratory effort  CV: S1S2, no obvious rub Abdomen: Soft, NTND, BS present Extremities: 1+ b/l LE edema Skin: Warm and dry, no rashes noted. Neuro/Psych: Awake, alert, following commands, normal mood Access: LUE AVF  Lab Results: Results for orders placed or performed during the hospital encounter of 01/18/15 (from the past 24 hour(s))  CBC     Status: Abnormal   Collection Time: 01/22/15  4:35 AM  Result Value Ref Range   WBC 12.9 (H) 3.8 - 10.6 K/uL   RBC 2.48 (L) 4.40 - 5.90 MIL/uL   Hemoglobin 7.0 (L) 13.0 - 18.0 g/dL   HCT 22.0 (L) 40.0 - 52.0 %   MCV 89.0 80.0 - 100.0 fL   MCH 28.4 26.0 - 34.0 pg   MCHC 31.9 (L) 32.0 - 36.0 g/dL   RDW 18.8 (H) 11.5 - 14.5 %   Platelets 220 150 - 440 K/uL  Basic metabolic panel     Status: Abnormal   Collection Time: 01/22/15  4:35 AM  Result Value Ref Range   Sodium 142 135 - 145 mmol/L   Potassium 3.9 3.5 - 5.1 mmol/L   Chloride 98 (L) 101 - 111 mmol/L   CO2 31 22 - 32 mmol/L   Glucose, Bld 94 65 - 99 mg/dL   BUN 38 (H) 6 - 20 mg/dL   Creatinine, Ser 8.36 (H) 0.61 - 1.24 mg/dL    Calcium 7.9 (L) 8.9 - 10.3 mg/dL   GFR calc non Af Amer 6 (L) >60 mL/min   GFR calc Af Amer 6 (L) >60 mL/min   Anion gap 13 5 - 15    Studies/Results: Dg Abd 1 View  01/22/2015   CLINICAL DATA:  74 year old male with a history of abdominal pain. Nausea  EXAM: ABDOMEN - 1 VIEW  COMPARISON:  01/21/2015, 01/18/2015  FINDINGS: Two frontal supine images.  Multiple distended small bowel loops, as was seen on the comparison study. Paucity of colonic gas. Small amount of retained enteric contrast within the left colon. The enteric contrast was not present on the comparison plain film study, and has progressed through the colon in the last 24 hours.  No definite evidence of free air.  Diffuse osteopenia.  No acute fracture.  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 Changes of bilateral hypogastric artery coiling.  Wire-less pressure monitor to the left lateral aspect of the endograft.  Bilateral double-J ureteral stents in place.  IMPRESSION: Persisting dilated small bowel loops,  either an ileus or small-bowel obstruction. If there is concern for acute intra abdominal process, CT would be indicated.  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 A wireless intrasac pressure monitor (CardioMEMES device) projects to the left aspect of the graft.  Unchanged position of bilateral double-J ureteral stents.  Signed,  Dulcy Fanny. Earleen Newport, DO  [facility]  [facility]   Electronically Signed   By: Corrie Mckusick D.O.   On: 01/22/2015 09:40   Dg Abd 1 View  01/21/2015   CLINICAL DATA:  Abdominal pain  EXAM: ABDOMEN - 1 VIEW  COMPARISON:  CT abdomen and pelvis January 18, 2015  FINDINGS: There are double-J stents bilaterally. There is also an aorto bi-iliac stent for aneurysm. There is generalized bowel dilatation in a pattern suggesting ileus. No free air  is seen on this supine examination. There are no air-fluid levels. There is atelectatic change in the lung bases. There are coils in the pelvis, presumably due to internal iliac artery embolization.  IMPRESSION: Bowel gas pattern most consistent with ileus. No free air seen. Double-J stents present as well as aortoiliac stent. Coils are present in the pelvis as well.   Electronically Signed   By: Lowella Grip III M.D.   On: 01/21/2015 10:12    . [START ON 01/27/2015] amiodarone  200 mg Oral BID  . [START ON 02/03/2015] amiodarone  200 mg Oral Daily  . amiodarone  400 mg Oral BID  . atorvastatin  40 mg Oral Daily  . meropenem (MERREM) IV  500 mg Intravenous Q24H  . sodium chloride  5 mL Intravenous Q12H  . 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 Pt had HD yesterday, no acute indication for HD today, will plan for HD again on Tuesday.  2. Anemia of chronic kidney disease: hgb 7.0 at last check, consider blood transfusion, defer to hospitalist.   3. Secondary Hyperparathyroidism: N25.81 - phos 4.7 yesterday, recheck on Tuesday with HD.  4. hypotension: off pressors.  5.  A. Fib with RVR: HR under control, on amiodarone.   LOS: 4 days   [name] 01/22/2015,3:28 PM

## 2015-01-22 NOTE — Consult Note (Signed)
[name] NAME:  [name], [name] MR#:  [id number] DATE OF BIRTH:  12-17-1940  DATE OF ADMISSION:  01/18/2015.  DATE OF CONSULTATION:  01/19/2015  REFERRING PHYSICIAN:  Dr. Vianne Bulls.   CONSULTING PHYSICIAN:  Georgette Dover, MD  PRIMARY CARE PHYSICIAN:  Dr. Netty Starring.    CHIEF COMPLAINT:  UTI, sepsis, bilateral hydronephrosis.  HISTORY OF PRESENT ILLNESS:  [name] is a 75 year old African American male who was brought to hospital by EMS with a septic-appearing picture.  He was hypotensive and in AFib with RVR.  He has been seen by cardiology, placed on amiodarone drip, and has been on Neo-Synephrine.  He underwent CT scan of the abdomen and pelvis, and I reviewed all the images.  This revealed a distended bladder above the umbilicus with bilateral hydroureteronephrosis and bilateral ureteral stents in place.  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  A Foley catheter was placed which decompressed his bladder.  He initially drained about 1200 mL of urine.  Throughout the day, he has made 300 mL of urine which is now clear.  It was turbid when the Foley was first placed.  A renal ultrasound was obtained which showed improvement in the hydronephrosis after the foley catheter was placed/bladder drained. The right hydronephrosis improved more than the left, but both sides look better. I reviewed the images with radiology. Also, the [name]'s pressors have been weaned down and are almost off.  He is back in a normal sinus rhythm with a heart rate of 83 beats per minute.    I called and spoke with his urologist today, Dr. Jacqlyn Larsen. 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 I wasn't clear if he ever had obstruction and Dr. Jacqlyn Larsen said he thought about removing the stents because the UO's looked open. On imaging it appears the [name] has been refluxing urine and had chronic  hydronephrosis over the past few months to years.  [name] was last seen November 2015, and his stents are due to be changed July 2016.  [name] has a history of metastatic prostate cancer and per Dr. Jacqlyn Larsen, received Lupron injection November 2015.  Dr. Jacqlyn Larsen reports his last PSA was 2.3.  [name] also has a history of bladder cancer but has not had a recurrence in recent years.  He had low-grade Ta disease per Dr. Jacqlyn Larsen.    PAST MEDICAL HISTORY:   1. Bladder cancer, low-grade Ta disease.  2. Prostate cancer, metastatic disease, status post Lupron November 2015.  3. Bilateral ureteral obstruction, with ureteral stents.   4. Chronic bilateral hydronephrosis. 5. History of neurogenic bladder, ? incontinence versus retention.  6. Hypertension. 7. Aortic abdominal aneurysm and iliac aneurysm, status post repairs. 8. End-stage renal disease, on hemodialysis.   ALLERGIES:  PENICILLIN.   SOCIAL HISTORY AND FAMILY HISTORY:  Reviewed.   PAST SURGICAL HISTORY:  Significant for TURBT, bilateral ureteral stent placement, AV fistula placement.    REVIEW OF SYSTEMS:  Obtained and as per HPI, otherwise negative.  [name] feels much better this evening.  He is very calm this evening and reports he would like to follow up with [facility]:   1. Neo-Synephrine drip. 2. Meropenem.  3. Amiodarone.   PHYSICAL EXAMINATION: VITAL SIGNS:  Heart rate 74, blood pressure 105/68, respiratory rate 23, and 96% saturation on nasal cannula. URINE OUTPUT:  1500 mL, 300 mL  last shift.  GENERAL:  He is resting but easily arousable and alert.  CARDIOVASCULAR:  Regular rate and rhythm. PULMONARY:  Respirations good effort and depth, no respiratory distress.  ABDOMEN:  Soft and nontender. GENITOURINARY:  Foley catheter in place, urine clear, good urine output.   LABORATORY DATA:  White count 14.  Urinalysis:  Too numerous to count red cells, too numerous to count white cells, many  bacteria.    CT scan of the abdomen and pelvis, renal ultrasound:  I reviewed the images independently and discussed with radiology.    ASSESSMENT AND PLAN: 1. Urinary tract infection 2. Sepsis.  Continue medical management.  [name] improving.  Pressors have almost been weaned off.  He is back in normal sinus rhythm. 3. Bilateral hydronephrosis.  Hydronephrosis is chronic and improved after Foley catheter placement/bladder drainage on renal ultrasound.  I would expect he would have some residual dilation given the chronic hydronephrosis.  The chronic dilation will not immediately go away.  His urine is now clear.  He is making good urine output.  Again, clinically he is back in sinus rhythm with a normal heart rate and on minimal pressor requirement. I don't think Nephrostomy tubes are necessary at this time.  4. 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 [name] now on HD.  I am not certain of his voiding status. He may have a component of urinary retention. If he truly has relfux he won't need longterm stents and they could be removed at some point. Urodynamics could be helpful in the long run. I would recommend continued Foley catheter for now.   5. Prostate cancer.  Per Dr. Jacqlyn Larsen, last androgen deprivation given November 2015 with a PSA of 2.3.  [name] will need followup PSA testing and will be due for androgen deprivation next month. 6. Bladder cancer.  [name] with history of low-grade Ta bladder cancer.  No recent recurrences.    I discussed the [name] with Dr. Erlene Quan who will follow and earlier with his primary urologist, Dr. Jacqlyn Larsen.     ____________________________ Georgette Dover, MD mre:kc D: 01/19/2015 19:34:32 ET T: 01/19/2015 21:05:34 ET JOB#: [id number]  cc: Georgette Dover, MD, <Dictator> Georgette Dover MD ELECTRONICALLY SIGNED 01/20/2015 9:37

## 2015-01-22 NOTE — Progress Notes (Signed)
  SUBJECTIVE:  Review of Systems:  Subjective/Chief Complaint Admitted fro encephalopathy and SOB. Found to have Afib with RVR and hypotension in ED. Septic shock due to UTI. now he is awake and mental status is improved. Off levophed and amiodarone. Cloudy urine with some blood clots. No complaints of pain or SOB. Had multiple vomits - noted ileus, so NGT placed.   Fever/Chills No   Cough No   Sputum No   Abdominal Pain No   Diarrhea No   Constipation No   Nausea/Vomiting Yes   SOB/DOE No   Chest Pain No   Telemetry Reviewed Afib   Dysuria No   Medications/Allergies Reviewed Medications/Allergies reviewed   VITAL SIGNS/ANCILLARY NOTES: **Vital Signs.:   30-Apr-16 08:20  Vital Signs Type Q 8hr  Temperature Temperature (F) 97.7  Celsius 36.5  Temperature Source oral  Pulse Pulse 86  Respirations Respirations 22  Systolic BP Systolic BP 449  Diastolic BP (mmHg) Diastolic BP (mmHg) 68  Mean BP 80  Pulse Ox % Pulse Ox % 94  Pulse Ox Activity Level  At rest  Oxygen Delivery 2L   Physical Exam:  GEN critically ill appearing   HEENT pale conjunctivae, hearing intact to voice, dry oral mucosa   NECK supple  No masses   RESP normal resp effort  clear BS   CARD irregular rate  No LE edema   ABD denies tenderness  soft  sluggish BS.   GU foley catheter in place  cloudy urine   EXTR negative cyanosis/clubbing, negative edema   SKIN No rashes, No ulcers   NEURO negative rigidity, negative tremor, follows commands   PSYCH alert, A+O to time, place, person   ASSESSMENT/PLAN:  Assessment/Plan:  Orders/Results reviewed Orders reviewed and updated, Laboratory results reviewed, Radiology results reviewed, Vital Signs reviewed, Nursing documentation reviewed   Assessment/Plan 18 m with Bladder/prostate cancer, ESRD on HD, hypertension here with confusion, SOB  * Septic shock due to UTI   Had ureteral stents- seen by urology and ID.  Was On Levophed for Map  >65- improved- BP   stable.  Contiued broad spectrum abx. May taper soon.  negative final cx. Appreciated ID consult.   repeated cx.  * Afib with RVR due to sepsis was On amiodarone drip. Appreciate cardiology help- now swiched to oral amiodarone.   As per cardiology eventually need Cath- no urgency.  * Ileus   NPO for now, NGT suction.   Monitor. Encourage PT.  * ESRD on HD  CRRt per nephrology  * Anemia of Chronic Disease with worsening  Transfused 1 unit PRBC   * Acute resp failure  Aspiration?  Monitor.   Now on room air.    Discharge Planning SNF/LTC   Code Status Full Code   Critical Care no, Total patient care time:, 35 minutes   Case Discussed With patient, family, nursing

## 2015-01-22 NOTE — Progress Notes (Signed)
Patient ID: [name], male   DOB: 06-19-1941, 74 y.o.   MRN: [id number]    Patient Name: [name] Date of Encounter: 01/22/2015     Active Problems:   Atrial fibrillation with RVR    SUBJECTIVE  C/o being hungry and wanting to eat. Denies chest pain, dyspnea improved.   CURRENT MEDS . [START ON 01/27/2015] amiodarone  200 mg Oral BID  . [START ON 02/03/2015] amiodarone  200 mg Oral Daily  . amiodarone  400 mg Oral BID  . atorvastatin  40 mg Oral Daily  . meropenem (MERREM) IV  1 g Intravenous Q24H  . sodium chloride  5 mL Intravenous Q12H  . [START ON 01/24/2015] vancomycin  1,000 mg Intravenous Q T,Th,Sa-HD    OBJECTIVE  Filed Vitals:   01/20/15 1300 01/22/15 0500 01/22/15 0802  BP:   99/54  Pulse:   76  Temp:   98.3 F (36.8 C)  TempSrc:   Oral  Resp:   18  Height: 5' 6.97" (1.701 m)    Weight: 224 lb 6.9 oz (101.8 kg) 222 lb 10.6 oz (101 kg)   SpO2:   100%    Intake/Output Summary (Last 24 hours) at 01/22/15 1215 Last data filed at 01/22/15 0500  Gross per 24 hour  Intake    129 ml  Output    300 ml  Net   -171 ml   Filed Weights   01/20/15 1300 01/22/15 0500  Weight: 224 lb 6.9 oz (101.8 kg) 222 lb 10.6 oz (101 kg)    PHYSICAL EXAM  General: Pleasant, chronically ill appearing,NAD. Neuro: Alert and oriented X 3. Moves all extremities spontaneously. Psych: Normal affect. HEENT:  Normal except NG tube in place   Neck: Supple without bruits or JVD. Lungs:  Resp regular and unlabored, CTA except for scattered basilar rales.  Heart: RRR no s3, s4, or murmurs. Abdomen: Soft, non-tender, non-distended, BS + x 4.  Extremities: No clubbing, cyanosis or edema. DP/PT/Radials 2+ and equal bilaterally.  Accessory Clinical Findings  CBC  Recent Labs  01/20/15 0430 01/22/15 0435  WBC 11.7* 12.9*  NEUTROABS 9.5*  --   HGB 6.9* 7.0*  HCT 21.5* 22.0*  MCV 89 89.0  PLT 176 932   Basic Metabolic Panel  Recent Labs  01/20/15 0430  01/22/15 0435  NA  --  142  K  --  3.9  CL  --  98*  CO2 30 31  GLUCOSE  --  94  BUN 32* 38*  CREATININE 6.73* 8.36*  CALCIUM 7.8* 7.9*   Liver Function Tests No results for input(s): AST, ALT, ALKPHOS, BILITOT, PROT, ALBUMIN in the last 72 hours. No results for input(s): LIPASE, AMYLASE in the last 72 hours. Cardiac Enzymes No results for input(s): CKTOTAL, CKMB, CKMBINDEX, TROPONINI in the last 72 hours. BNP Invalid input(s): POCBNP D-Dimer No results for input(s): DDIMER in the last 72 hours. Hemoglobin A1C No results for input(s): HGBA1C in the last 72 hours. Fasting Lipid Panel No results for input(s): CHOL, HDL, LDLCALC, TRIG, CHOLHDL, LDLDIRECT in the last 72 hours. Thyroid Function Tests No results for input(s): TSH, T4TOTAL, T3FREE, THYROIDAB in the last 72 hours.  Invalid input(s): FREET3  TELE  NSR  Radiology/Studies  Dg Abd 1 View  01/22/2015   CLINICAL DATA:  74 year old male with a history of abdominal pain. Nausea  EXAM: ABDOMEN - 1 VIEW  COMPARISON:  01/21/2015, 01/18/2015  FINDINGS: Two frontal supine images.  Multiple distended small bowel  loops, as was seen on the comparison study. Paucity of colonic gas. Small amount of retained enteric contrast within the left colon. The enteric contrast was not present on the comparison plain film study, and has progressed through the colon in the last 24 hours.  No definite evidence of free air.  Diffuse osteopenia.  No acute fracture.  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 Changes of bilateral hypogastric artery coiling.  Wire-less pressure monitor to the left lateral aspect of the endograft.  Bilateral double-J ureteral stents in place.  IMPRESSION: Persisting dilated small bowel loops, either an ileus or small-bowel obstruction. If there is concern for acute intra abdominal process, CT would be indicated.  Surgical changes of prior endograft  repair of infrarenal abdominal aortic aneurysm with Gore endograft. A wireless intrasac pressure monitor (CardioMEMES device) projects to the left aspect of the graft.  Unchanged position of bilateral double-J ureteral stents.  Signed,  Dulcy Fanny. Earleen Newport, DO  [facility]  [facility]   Electronically Signed   By: Corrie Mckusick D.O.   On: 01/22/2015 09:40   Dg Abd 1 View  01/21/2015   CLINICAL DATA:  Abdominal pain  EXAM: ABDOMEN - 1 VIEW  COMPARISON:  CT abdomen and pelvis January 18, 2015  FINDINGS: There are double-J stents bilaterally. There is also an aorto bi-iliac stent for aneurysm. There is generalized bowel dilatation in a pattern suggesting ileus. No free air is seen on this supine examination. There are no air-fluid levels. There is atelectatic change in the lung bases. There are coils in the pelvis, presumably due to internal iliac artery embolization.  IMPRESSION: Bowel gas pattern most consistent with ileus. No free air seen. Double-J stents present as well as aortoiliac stent. Coils are present in the pelvis as well.   Electronically Signed   By: Lowella Grip III M.D.   On: 01/21/2015 10:12   Dg Chest Port 1 View  01/18/2015   CLINICAL DATA:  74 year old male status post central line placement  EXAM: PORTABLE CHEST - 1 VIEW  COMPARISON:  CT scan chest/abdomen/ pelvis obtained earlier today ; most recent prior chest x-ray 07/08/2014  FINDINGS: External defibrillator pad projects over the chest. Interval placement of a right IJ approach central venous catheter. The catheter is somewhat lateral in position. The tip is in the region of the distal SVC. Increased enlargement of the cardiopericardial silhouette. And atherosclerotic thoracic aorta. Pulmonary vascular congestion. All  IMPRESSION: 1. The tip of the right IJ approach central venous catheter is in the region of the distal SVC although it is slightly more lateral than expected. Recommend  clinical correlation to insure that the catheter is within the venous system. 2. No evidence of pneumothorax or new pleural effusion. 3. Cardiomegaly. 4. Pulmonary vascular congestion, low lung volumes and bibasilar atelectasis.   Electronically Signed   By: Jacqulynn Cadet M.D.   On: 01/18/2015 21:22   Ct Angiography Chest/abd/pelvis ([facility] Hx)  01/18/2015   CLINICAL DATA:  Patient to ED via EMS from home with c.o respiratory distress, atrial fib, and groin pain.  EXAM: CT ANGIOGRAPHY CHEST, ABDOMEN AND PELVIS  TECHNIQUE: Multidetector CT imaging through the chest, abdomen and pelvis was performed using the standard protocol during bolus administration of intravenous contrast. Multiplanar reconstructed images and MIPs were obtained and reviewed to evaluate the vascular anatomy.  CONTRAST:  100 mL of Omnipaque 350 intravenous contrast.  COMPARISON:  None.  FINDINGS: CTA CHEST FINDINGS  Thoracic aorta shows atherosclerotic irregularity along the arch and descending aorta. There is mild dilation of the thoracic aorta most prominently of the descending aorta where it measures 4.8 cm. No aortic dissection. The aortic branch vessels are widely patent. Mild atherosclerotic calcifications are noted the base of the left subclavian artery and along the innominate artery.  The heart is moderately enlarged. There are moderate coronary artery calcifications. No neck base or axillary masses or adenopathy. No mediastinal or hilar masses or adenopathy. There is lower lobe opacity, left greater than right, as well as dependent left upper lobe opacity, most likely all atelectasis. Pneumonia is not excluded but felt less likely. There is no pulmonary edema. 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 Minimal left pleural effusion. No pneumothorax.  Review of the MIP images confirms the above findings.  CTA ABDOMEN AND PELVIS FINDINGS  There is an excluded infrarenal abdominal aortic aneurysm. Native aneurysm measures 5.4 cm  x 4.7 cm. The excluded lumen is widely patent. Stents extend into the external iliac arteries, also widely patent. There is dilation of the common femoral arteries. There is an aneurysm or pseudoaneurysm surrounding the right common femoral artery and proximal superficial femoral and deep femoral arteries, measuring 5.7 cm x 5.3 cm. There is no evidence of aortic rupture.  There is significant narrowing at the origin of the celiac axis, measuring 70%. The superior mesenteric artery is widely patent. Mild plaque is noted on the renal arteries without significant stenosis.  Liver, spleen, gallbladder, pancreas: Unremarkable. There is mild thickening of the adrenal glands suggesting nodular hyperplasia. This is stable. There is significant bilateral hydronephrosis. Bilateral ureteral stents extend from the renal pelves to the bladder. There is bilateral renal cortical thinning. No convincing renal mass. Bladder mildly thick-walled. Is distended.  No discrete enlarged lymph node.  No abnormal fluid collections.  There are scattered colonic diverticula. No diverticulitis. No bowel wall thickening or mesenteric inflammation. There is no bowel dilation to suggest obstruction.  MUSCULOSKELETAL  Bones are demineralized. There are degenerative changes throughout the visualized spine. There are areas of sclerosis in the posterior aspect of the T12 vertebrae with a small focus of sclerosis in the T7 vertebrae. T12 focus of sclerosis is chronic unchanged from the prior abdomen and pelvis CT. There are no fractures. No osteolytic lesions. There are arthropathic changes of both hip joints.  Review of the MIP images confirms the above findings.  IMPRESSION: 1. No evidence of aortic dissection or of a a rupture of the abdominal aortic aneurysm. 2. No acute vascular conclusion. 3. Dilation of the descending thoracic aorta. 4. Excluded infrarenal abdominal aortic aneurysm. The excluded portion of the aneurysm is widely patent. 5.  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 6. Bilateral significant hydronephrosis with bilateral ureteral stents which appear well-positioned. The degree of hydronephrosis and the ureteral stents are stable from the prior CT. 7. Mild bladder wall thickening. Consider cystitis in the proper clinical setting. 8. In the chest, there is moderate cardiomegaly and dependent atelectasis, greater on the left. No convincing pneumonia and no pulmonary edema.   Electronically Signed   By: Lajean Manes M.D.   On: 01/18/2015 18:41   US Kidneys ([facility] Hx)  01/19/2015   ADDENDUM REPORT: 01/19/2015 16:52  ADDENDUM: When directly comparing to recent CT, the hydronephrosis in the right kidney is moderately improved, particularly in the interpolar region and lower pole collecting system. The hydronephrosis  in the left kidney is mildly improved.  Notably, both kidneys are likely mildly improved when compared to CT dated 06/24/2014, indicating that the overall appearance is chronic.   Electronically Signed   By: Julian Hy M.D.   On: 01/19/2015 16:52   01/19/2015   CLINICAL DATA:  Bilateral hydronephrosis  EXAM: RENAL / URINARY TRACT ULTRASOUND COMPLETE  COMPARISON:  CT abdomen pelvis dated 01/18/2015  FINDINGS: Right Kidney:  Length: 11.7 cm. Moderate hydronephrosis. Indwelling ureteral stent.  Left Kidney:  Length: 11.9 cm. Moderate hydronephrosis. Indwelling ureteral stent.  Bladder:  Decompressed by indwelling Foley catheter.  IMPRESSION: Moderate hydronephrosis with indwelling ureteral stents bilaterally.  Bladder decompressed by indwelling Foley catheter.  Electronically Signed: By: Julian Hy M.D. On: 01/19/2015 16:23    ASSESSMENT AND PLAN  1. Atrial fib with an RVR - maintaining NSR on amiodarone. Nausea is improved 2. Acute on chronic diastolic heart failure - his symptoms are improved.  3. Small bowel obstruction - tolerating NG tube  suction, remains NPO, wants to eat. Defer transition to po to his primary team.   Carleene Overlie Millenia Waldvogel,M.D.  01/22/2015 12:15 PM

## 2015-01-22 NOTE — Progress Notes (Addendum)
ANTIBIOTIC CONSULT NOTE - FOLLOW UP  Pharmacy Consult for Vancomycin/Meropenem Indication: UTI/Septic Shock  Allergies  Allergen Reactions  . Penicillins Hives    Patient Measurements: Height: 5' 6.97" (170.1 cm) Weight: 222 lb 10.6 oz (101 kg) IBW/kg (Calculated) : 66.03   Vital Signs: Temp: 98.3 F (36.8 C) (05/01 0802) Temp Source: Oral (05/01 0802) BP: 99/54 mmHg (05/01 0802) Pulse Rate: 76 (05/01 0802)  Labs:  Recent Labs  01/20/15 0430 01/22/15 0435  WBC 11.7* 12.9*  HGB 6.9* 7.0*  PLT 176 220  CREATININE 6.73* 8.36*   Estimated Creatinine Clearance: 8.9 mL/min (by C-G formula based on Cr of 8.36).    Microbiology: Recent Results (from the past 720 hour(s))  Culture, blood (single)     Status: None (Preliminary result)   Collection Time: 01/18/15  7:11 PM  Result Value Ref Range Status   Micro Text Report   Preliminary       COMMENT                   NO GROWTH IN 39 HOURS   ANTIBIOTIC                                                      Culture, blood (single)     Status: None (Preliminary result)   Collection Time: 01/18/15  7:11 PM  Result Value Ref Range Status   Micro Text Report   Preliminary       COMMENT                   NO GROWTH IN 48 HOURS   ANTIBIOTIC                                                      Urine culture     Status: None   Collection Time: 01/18/15  7:36 PM  Result Value Ref Range Status   Micro Text Report   Final       SOURCE: INDWELLING CATHETER    COMMENT                   NO GROWTH IN 36 HOURS   ANTIBIOTIC                                                      Urine culture     Status: None (Preliminary result)   Collection Time: 01/20/15  4:09 PM  Result Value Ref Range Status   Micro Text Report   Preliminary       SOURCE: INDWELLING CATH    COMMENT                   COLONIES TOO SMALL TO READ   ANTIBIOTIC  Anti-infectives    Start      Dose/Rate Route Frequency Ordered Stop   01/24/15 1200  vancomycin (VANCOCIN) IVPB 1000 mg/200 mL premix     1,000 mg 200 mL/hr over 60 Minutes Intravenous Every T-Th-Sa (Hemodialysis) 01/21/15 1551     01/22/15 2100  meropenem (MERREM) 1 g in sodium chloride 0.9 % 100 mL IVPB  Status:  Discontinued     1 g 200 mL/hr over 30 Minutes Intravenous Every 24 hours 01/21/15 1219 01/22/15 1336   01/22/15 2100  meropenem (MERREM) 500 mg in sodium chloride 0.9 % 50 mL IVPB     500 mg 100 mL/hr over 30 Minutes Intravenous Every 24 hours 01/22/15 1336     01/22/15 0000  vancomycin (VANCOCIN) IVPB 1000 mg/200 mL premix  Status:  Discontinued     1,000 mg 200 mL/hr over 60 Minutes Intravenous Every Dialysis 01/21/15 1437 01/21/15 1551      Assessment:  HD patient receiving Meropenem and Vancomycin for treatment of UTI/septic shock.  ID following.  UCx: colonies too small to read and BCx: NG x 2.  Patient received HD and Vanc 1 gm on 4/30 after HD.  Next HD session for 5/2.  Trough currently ordered for 5/2 prior to HD.  Goal of Therapy:  Vanc trough of 15-25  Plan:  Will transition patient to Meropenem 500 mg IV q24h for renal dosing in HD patient.  Continue Vancomycin 1 gm IV after each HD session. Obtain Vancomycin trough prior to 3rd HD session on 5/3. Pharmacy will continue to follow.  Murrell Converse, PharmD Clinical Pharmacist 01/22/2015

## 2015-01-22 NOTE — Progress Notes (Signed)
Physical Therapy Evaluation Patient Details Name: [name] MRN: [id number] DOB: December 29, 1940 Today's Date: 01/22/2015   History of Present Illness  74 yo male with onset of septic shock and respriatory distress, hypotension and atelectasis, recent bladder history, PMHx:  a-fib with RVR, prostate CA, ESRD T TH S, neurogenic bladder, AAA, anemia,   Clinical Impression  Pt was seen finally with low level visit to control for his low lab values and he was quite tired afterward.  Plan is to ask for CIR and if not able then to go to SNF, which family is thinking about initially.  He is motivated and trying despite his medical issues.    Follow Up Recommendations CIR;Supervision/Assistance - 24 hour    Equipment Recommendations  None recommended by PT (wait disposition from therapy)    Recommendations for Other Services Rehab consult     Precautions / Restrictions Precautions Precautions: Fall Restrictions Weight Bearing Restrictions: No      Mobility  Bed Mobility Overal bed mobility: Needs Assistance Bed Mobility: Supine to Sit;Sit to Supine     Supine to sit: Max assist Sit to supine: Total assist;+2 for physical assistance;+2 for safety/equipment   General bed mobility comments: Pt is requiring assistance due to stiffness in joints  Transfers Overall transfer level: Needs assistance   Transfers:  (unable to stand)           General transfer comment: unable to stand  Ambulation/Gait             General Gait Details: unable to stand  Stairs            Wheelchair Mobility    Modified Rankin (Stroke Patients Only)       Balance Overall balance assessment: Needs assistance Sitting-balance support: Feet supported;Bilateral upper extremity supported Sitting balance-Leahy Scale: Poor Sitting balance - Comments: reminders to control his midline awareness and to give him cues for hnd placement Postural control: Right lateral lean                                    Pertinent Vitals/Pain Pain Assessment: No/denies pain    Home Living Family/patient expects to be discharged to:: Skilled nursing facility                      Prior Function Level of Independence: Independent with assistive device(s)         Comments: Home with family and used [facility]     Hand Dominance        Extremity/Trunk Assessment   Upper Extremity Assessment: Generalized weakness           Lower Extremity Assessment: Generalized weakness      Cervical / Trunk Assessment: Normal  Communication   Communication: Expressive difficulties;Other (comment) (recent intubation)  Cognition Arousal/Alertness: Awake/alert Behavior During Therapy: WFL for tasks assessed/performed Overall Cognitive Status: Within Functional Limits for tasks assessed                      General Comments General comments (skin integrity, edema, etc.): Pt has some low lab values of hgb of 7 and Ca 8.2, resulting in some inability to push much in addition to NPO with KUB today, NG tube in place    Exercises General Exercises - Lower Extremity Ankle Circles/Pumps: AROM;Both;10 reps Quad Sets: AROM;Both;10 reps Long Arc Quad: AROM;Both;10 reps Heel Slides: AROM;Both;10 reps Hip Flexion/Marching:  AROM;Both;10 reps      Assessment/Plan    PT Assessment Patient needs continued PT services  PT Diagnosis Generalized weakness   PT Problem List Decreased strength;Decreased range of motion;Decreased activity tolerance;Decreased balance;Decreased mobility;Decreased coordination;Decreased knowledge of use of DME;Cardiopulmonary status limiting activity;Decreased skin integrity  PT Treatment Interventions DME instruction;Gait training;Functional mobility training;Therapeutic activities;Therapeutic exercise;Balance training;Neuromuscular re-education;Patient/family education   PT Goals (Current goals can be found in the Care Plan section) Acute Rehab  PT Goals Patient Stated Goal: to get walking again PT Goal Formulation: With patient/family Time For Goal Achievement: 02/05/15 Potential to Achieve Goals: Good    Frequency Min 2X/week   Barriers to discharge Inaccessible home environment;Decreased caregiver support      Co-evaluation               End of Session Equipment Utilized During Treatment: Oxygen Activity Tolerance: Patient tolerated treatment well Patient left: in bed;with call bell/phone within reach;with bed alarm set;with family/visitor present Nurse Communication: Mobility status         Time: 1100-1138 PT Time Calculation (min) (ACUTE ONLY): 38 min   Charges:   PT Evaluation $Initial PT Evaluation Tier I: 1 Procedure PT Treatments $Therapeutic Exercise: 8-22 mins $Therapeutic Activity: 8-22 mins   PT G Codes:        Ramond Dial 02-18-15, 1:26 PM   Mee Hives, PT MS Acute Rehab Dept. Number: [phone]

## 2015-01-22 NOTE — Consult Note (Signed)
Brief Consult Note: Diagnosis: new onset a-fib with RVR, altered mental status-->improved, hypotension, acute on chronic anemia.   Patient was seen by consultant.   Recommend further assessment or treatment.   Orders entered.   Discussed with Attending MD.   Comments: Patient was seen and examined by Cardiology (Dr. Ellyn Hack and myself). Called by ED for patient in new onset a-fib with RVR with heart rates in the 140s-160s, hypotensive, and altered. He received 2L IV fluids. He was placed on IV amiodarone bolus x 10 minutes before this was discontinued by ED physician. Heart rate remained in the 1-teens to 120s to 130s from there on. Labs showed a hgb of 6.9. K+ 3.3, pBNP 1518. Afebrile. Blood pressure did improve some with MAP above 60. He did begin to [facility] better and recognized multiple family members upon entering the room.   Plan: -Start amiodarone gtt at full dose all night -Orders have been placed for him to receive blood  -In light of him receiving 2L IV fluids in the ED he may go into pulmonary edema, may need to intubate given his difficutly mentating he is not protecting his airway at baseline -May need CRRT 4/28 given amount of fluids (hypotension precludes this now) -Discussed risks of DCCV and chemical cardioversion vs current situation in detail with family -Would look for outside driving force for the above -Case was signed out to PM cardiology coverage -Greater than 30 minutes of critical care cardiology time spent with the patient   -High risk of cardiopulmonary death.  Electronic Signatures for Addendum Section:  Leonie Man (MD) (Signed Addendum 27-Apr-16 20:35)  I was with [name] in the ER.  We performed a brief History & PE & discussed the case with the EDP.  Afib RVR with hypotension & severe anemia in a pt with HD,but no prior Cardaic history.  "Amiodarone Bolus" lead to drop in BP - so would simply slow drip to the High Dose Loading Infusion rate  while transfusing PRBC to Hgb at least > 8.5. As we are not sure of his cardiac function -- will need to monitor for SSx of pulmonary edema & potential need for intubation. full consult note in AM. On-call [facility] MD is aware.  DH   Electronic Signatures: Rise Mu (PA-C)  (Signed 27-Apr-16 18:19)  Authored: Brief Consult Note Leonie Man (MD)  (Signed 27-Apr-16 20:35)  Co-Signer: Brief Consult Note   Last Updated: 27-Apr-16 20:35 by Leonie Man (MD)

## 2015-01-22 NOTE — Progress Notes (Signed)
MEDICATION RELATED CONSULT NOTE - FOLLOW UP   Pharmacy Consult for Renal dosing in HD Patient Indication: Medication adjustments in HD patient  Allergies  Allergen Reactions  . Penicillins Hives    Patient Measurements: Height: 5' 6.97" (170.1 cm) Weight: 222 lb 10.6 oz (101 kg) IBW/kg (Calculated) : 66.03   Labs:  Recent Labs  01/20/15 0430 01/22/15 0435  WBC 11.7* 12.9*  HGB 6.9* 7.0*  HCT 21.5* 22.0*  PLT 176 220  CREATININE 6.73* 8.36*   Estimated Creatinine Clearance: 8.9 mL/min (by C-G formula based on Cr of 8.36).     Medications:  Prescriptions prior to admission  Medication Sig Dispense Refill Last Dose  . amLODipine (NORVASC) 5 MG tablet Take 5 mg by mouth at bedtime.     . budesonide-formoterol (SYMBICORT) 160-4.5 MCG/ACT inhaler Inhale 2 puffs into the lungs 2 (two) times daily.     . chlorpheniramine (CHLOR-TRIMETON) 4 MG tablet Take 4 mg by mouth every 4 (four) hours as needed for allergies.     Marland Kitchen loperamide (IMODIUM) 2 MG capsule Take 2 mg by mouth every 4 (four) hours as needed for diarrhea or loose stools.     . magnesium oxide (MAG-OX) 400 MG tablet Take 400 mg by mouth at bedtime.     . NONFORMULARY OR COMPOUNDED ITEM Take 1 tablet by mouth daily. Rena-Vite Vitamin B Complex with C and Folic Acid     . sevelamer carbonate (RENVELA) 800 MG tablet Take 800 mg by mouth 3 (three) times daily with meals.     . Tiotropium Bromide Monohydrate 2.5 MCG/ACT AERS Inhale 1 puff into the lungs daily.      Scheduled:  . [START ON 01/27/2015] amiodarone  200 mg Oral BID  . [START ON 02/03/2015] amiodarone  200 mg Oral Daily  . amiodarone  400 mg Oral BID  . atorvastatin  40 mg Oral Daily  . meropenem (MERREM) IV  500 mg Intravenous Q24H  . sodium chloride  5 mL Intravenous Q12H  . [START ON 01/24/2015] vancomycin  1,000 mg Intravenous Q T,Th,Sa-HD    Assessment:  Patient with ESRD requiring HD on Tues, Thurs, Sat schedule.  Goal of Therapy:  Adjustment of  medications for renal function.  Plan:  No renal adjustments warranted at this time.  Will continue to follow. Murrell Converse, PharmD Clinical Pharmacist 01/22/2015

## 2015-01-22 NOTE — Consult Note (Signed)
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  _________________  PMH: 1. 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 prostate CA s/p BCG injections and Lupron injections 3. bladder CA s/p ureteral stents and neurogenic bladder 4. abdominal aortic aneurysm and iliac aneurysm s/p repair __________________   Present Illness 74 year old male with the above complex problem list who presented to [facility] with the above CC.  No known previous cardiac history. He was was last seen in his usual state of health on 4/26 by his niece. He did under go HD on that day, per report this was uneventful. His niece states on 4/27 she noticed he was having shortness of breath, confused, and his "skin color was dark." He did not complain of active chest pain. The patient, upon coming to the Emergency Room, was in apparent new onset atrial fibrillation with RVR, heart rate 163 beats per minute, and he also was hypotensive with blood pressure 62/50. The patient was very agitated in the Emergency Room and unable to tell any history. History obtained from ER staff and family. The patient received 3 liters of normal saline, 1 unit of packed RBC, and blood pressure is remained hypotensive around 88C-16S sytolic and 06T diastolic, heart rate did improve into the 1-teens to 120s when we were seeing him. He was initially started on amiodarone bolus, however this dropped his BP. He was started on amiodarone gtt 33.3 to keep BP stable and workup for sepsis. We discussed with the family  chemical cardioversion and DCCV. The patient noted to have anemia, hemoglobin 6.9 and BNP 1518. The patient right now on IV amiodarone drip with HR 113. He is in septic shock with hypotension, elevated lactic acid of 3.8, and has very turbid milky urine obtained from Foley catheter. The patient unable to give any history at this time, slightly confused and very restless, saying that he has pain in the groin. CTA chest/abd/pelvis showed no evidence of dissection or rupture of aneurysm, no vascular occuslion. Some atlectasis, no pulmonary edema. Blood culture no growth to date.   SOCIAL HISTORY:  Lives alone and smokes about 1/2 pack a of cigarettes a day. No alcohol. No drugs.  Lives in nursing home  FAMILY HISTORY:  The patient's mother died of old age and he know about his father.   Physical Exam:  GEN well developed, disheveled, critically ill appearing, frail appearing   HEENT pale conjunctivae, PERRL, hearing intact to voice, dry oral mucosa   NECK supple  trachea midline  no JVD   RESP postive use of accessory muscles  coarse breath sounds   CARD Irregular rate and rhythm  Tachycardic  No murmur   ABD denies tenderness  soft   LYMPH negative neck   EXTR negative edema   SKIN normal to palpation   NEURO cranial nerves intact, motor/sensory function intact   PSYCH alert, anxious   Review of Systems:  Subjective/Chief Complaint weak, malaise   General: Fatigue  Weakness   Skin: No Complaints   ENT: No Complaints  Eyes: No Complaints   Neck: No Complaints   Respiratory: Short of breath   Cardiovascular: No Complaints   Gastrointestinal: No Complaints   Genitourinary: No Complaints   Vascular: No Complaints   Musculoskeletal: No Complaints   Neurologic: No Complaints   Hematologic: No Complaints   Endocrine: No Complaints   Psychiatric: No Complaints   Review of Systems: All other systems were reviewed and found to be negative   ROS mild  confusion   Medications/Allergies Reviewed Medications/Allergies reviewed   Family & Social History (Removed):        prostate cancer:    UTI:    CVA/Stroke:    Diabetes (Diet Controlled):    Renal Insufficiency:    Neurogenic bladder:    Lower extremity weakness:    Cancer of bladder:    Hypertension:    dialysis fistula on left arm:    Nephrostomy:    Renal Stents:    AAA Repair:    Coil embollization of hypogastric artery:    Bladder surgery:   Home Medications: Medication Instructions Status  Renvela carbonate 800 mg oral tablet 2 tab(s) orally 3 times a day (with meals) Active  Spiriva Respimat 2.5 mcg/inh inhalation aerosol 1 puff(s) inhaled once a day Active  loperamide 2 mg oral tablet 1 tab(s) orally every 4 hours, As Needed - for Diarrhea Active  Rena-Vite Vitamin B Complex with C and Folic Acid oral tablet 1 tab(s) orally once a day Active  chlorpheniramine 4 mg oral tablet 1 tab(s) orally 4 times a day, As Needed for allergies. Active  Symbicort 160 mcg-4.5 mcg/inh inhalation aerosol 2 puff(s) inhaled 2 times a day Active  amLODIPine 5 mg oral tablet 1 tab(s) orally once a day (in the evening) Active  magnesium oxide 400 mg oral tablet 1 tab(s) orally once a day (at bedtime) Active   Lab Results:  Thyroid:  28-Apr-16 06:26   Thyroid Stimulating Hormone 1.816 (0.350-4.500 NOTE: New Reference Range  11/29/14)  Lab:  27-Apr-16 18:50   pH (ABG)  7.530 (7.350-7.450 NOTE: New Reference Range 04/16/14)  PCO2  31 (32-48 NOTE: New Reference Range 05/03/14)  PO2  59 (83-108 NOTE: New Reference Range 04/16/14)  FiO2 28  Base Excess  4 (-3-3 NOTE: New Reference Range 05/03/14)  Routine Chem:  27-Apr-16 17:07   Magnesium, Serum 1.8 (1.7-2.4 THERAPEUTIC RANGE: 4-7 mg/dL TOXIC: > 10 mg/dL  ----------------------- NOTE: New Reference Range  11/29/14)    19:20   Lactic Acid  Venous  3.8 (0.5-2.0 NOTE: New Reference Range:  11/29/14)    19:36    Result Comment ua dipstick - Unable to obtain valid dipstick results   - due to interfering color of urine.  Result(s) reported on 18 Jan 2015 at 08:08PM.  28-Apr-16 00:55   Lactic Acid  Venous 1.7 (0.5-2.0 NOTE: New Reference Range:  11/29/14)    06:26   Glucose, Serum  133 (65-99 NOTE: New Reference Range  11/29/14)  BUN  41 (6-20 NOTE: New Reference Range  11/29/14)  Creatinine (comp)  8.24 (0.61-1.24 NOTE: New Reference Range  11/29/14)  Sodium, Serum 138 (135-145 NOTE: New Reference Range  11/29/14)  Potassium, Serum 3.5 (3.5-5.1 NOTE: New Reference Range  11/29/14)  Chloride, Serum  99 (101-111 NOTE: New Reference Range  11/29/14)  CO2, Serum 27 (22-32 NOTE: New Reference Range  11/29/14)  Calcium (Total), Serum  7.9 (8.9-10.3 NOTE: New Reference Range  11/29/14)  Anion Gap 12  eGFR (African American)  7  eGFR (  Non-African American)  6 (eGFR values <55m/min/1.73 m2 may be an indication of chronic kidney disease (CKD). Calculated eGFR is useful in patients with stable renal function. The eGFR calculation will not be reliable in acutely ill patients when serum creatinine is changing rapidly. It is not useful in patients on dialysis. The eGFR calculation may not be applicable to patients at the low and high extremes of body sizes, pregnant women, and vegetarians.)  Cardiac:  27-Apr-16 17:07   Troponin I  0.33 (0.00-0.03 0.03 ng/mL or less: NEGATIVE  Repeat testing in 3-6 hrs  if clinically indicated. >0.05 ng/mL: POTENTIAL  MYOCARDIAL INJURY. Repeat  testing in 3-6 hrs if  clinically indicated. NOTE: An increase or decrease  of 30% or more on serial  testing suggests a  clinically important change NOTE: New Reference Range  11/29/14)  28-Apr-16 06:26   CPK-MB, Serum 3.8 (0.5-5.0 NOTE: New Reference Range  11/29/14)  Routine UA:  27-Apr-16 19:36   Color (UA) GREEN  Clarity (UA) TURBID  Glucose (UA) see comment  Bilirubin (UA) see comment   Ketones (UA) see comment  Specific Gravity (UA) 1.027  Blood (UA) see comment  pH (UA) see comment  Protein (UA) see comment  Nitrite (UA) SEE COMMENT  Leukocyte Esterase (UA) see comment  RBC (UA) TNTC  WBC (UA) TNTC  Bacteria (UA) MANY  Epithelial Cells (UA) NONE SEEN  WBC Clump (UA) PRESENT (Result(s) reported on 18 Jan 2015 at 08:08PM.)  Routine Hem:  27-Apr-16 17:07   WBC (CBC)  14.0  Hemoglobin (CBC)  6.9  Hematocrit (CBC)  22.3  28-Apr-16 06:26   WBC (CBC)  14.1  RBC (CBC)  2.51  Hemoglobin (CBC)  7.3  Hematocrit (CBC)  22.3  Platelet Count (CBC) 208  MCV 89  MCH 29.0  MCHC 32.7  RDW  18.5  Neutrophil % 74.1  Lymphocyte % 13.3  Monocyte % 11.3  Eosinophil % 1.0  Basophil % 0.3  Neutrophil #  10.4  Lymphocyte # 1.9  Monocyte #  1.6  Eosinophil # 0.1  Basophil # 0.0 (Result(s) reported on 19 Jan 2015 at 0[facility])   EKG:  EKG Interp. by me   Interpretation EKG shows a-fib with RVR, 139 bpm, nonspecific inferolateral st/t changes   Radiology Results: XRay:    27-Apr-16 21:09, Chest Portable Single View  Chest Portable Single View   REASON FOR EXAM:    post central line  COMMENTS:       PROCEDURE: DXR - DXR PORTABLE CHEST SINGLE VIEW  - Jan 18 2015  9:09PM     CLINICAL DATA:  74year old male status post central line placement    EXAM:  PORTABLE CHEST - 1 VIEW    COMPARISON:  CTscan chest/abdomen/ pelvis obtained earlier today ;  most recent prior chest x-ray 07/08/2014    FINDINGS:  External defibrillator pad projects over the chest. Interval  placement of a right IJ approach central venous catheter. The  catheter is somewhat lateral in position. The tip is in the region  of the distal SVC. Increased enlargement of the cardiopericardial  silhouette. And atherosclerotic thoracic aorta. Pulmonary vascular  congestion. All     IMPRESSION:  1. The tip of the right IJ approach central venous catheter is in  the region of the distal SVC although  it is slightly more lateral  than expected. Recommend clinical correlation to insure that the  catheter is within the venous system.  2. No evidence of pneumothorax or new pleural  effusion.  3. Cardiomegaly.  4. Pulmonary vascular congestion, low lung volumes and bibasilar  atelectasis.  Electronically Signed    By: Jacqulynn Cadet M.D.    On: 01/18/2015 21:22         Verified By: Criselda Peaches, M.D.,  CT:    27-Apr-16 18:23, CT Angiography Chest/Abd/Pelvis w/wo  CT Angiography Chest/Abd/Pelvis w/wo   REASON FOR EXAM:    abd pain, hypotensive  COMMENTS:       PROCEDURE: CT  - CT ANGIOGRAPHY CHEST/ABD/PELVIS  - Jan 18 2015  6:23PM     CLINICAL DATA:  Patient to ED via EMS from home with c.o respiratory  distress, atrial fib, and groin pain.    EXAM:  CT ANGIOGRAPHY CHEST, ABDOMEN AND PELVIS    TECHNIQUE:  Multidetector CT imaging through the chest, abdomen and pelvis was  performed using the standard protocol during bolus administration of  intravenous contrast. Multiplanar reconstructed imagesand MIPs were  obtained and reviewed to evaluate the vascular anatomy.    CONTRAST:  100 mL of Omnipaque 350 intravenous contrast.    COMPARISON:  None.    FINDINGS:  CTA CHEST FINDINGS    Thoracic aorta shows atherosclerotic irregularity along the arch and  descending aorta. There is mild dilation of the thoracic aorta most  prominently of the descending aorta where it measures 4.8 cm. No  aortic dissection. The aortic branch vessels are widely patent. Mild  atherosclerotic calcifications are noted the base of the left  subclavian artery and along the innominate artery.  The heart is moderately enlarged. There are moderate coronary artery  calcifications. No neck base or axillary masses or adenopathy. No  mediastinal or hilar masses or adenopathy. There is lower lobe  opacity, left greater than right, as well as dependent left upper  lobe opacity, most likely all  atelectasis. Pneumonia is not excluded  but felt less likely. There is no pulmonary edema. 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 Minimal left pleural  effusion. No pneumothorax.    Review of the MIP images confirms the above findings.    CTA ABDOMEN AND PELVIS FINDINGS    There is an excluded infrarenal abdominal aortic aneurysm. Native  aneurysm measures 5.4 cmx 4.7 cm. The excluded lumen is widely  patent. Stents extend into the external iliac arteries, also widely  patent. There is dilation of the common femoral arteries. There is  an aneurysm or pseudoaneurysm surrounding the right common femoral  artery and proximal superficial femoral and deep femoral arteries,  measuring 5.7 cm x 5.3 cm. There is no evidence of aortic rupture.    There is significant narrowing at the origin of the celiac axis,  measuring 70%. The superior mesenteric artery is widely patent. Mild  plaque is noted on the renal arteries without significant stenosis.    Liver, spleen, gallbladder, pancreas: Unremarkable. There is mild  thickening of the adrenal glands suggesting nodular hyperplasia.  This is stable. There is significant bilateral hydronephrosis.  Bilateral ureteral stents extend from the renal pelves to the  bladder. There is bilateral renal cortical thinning. No convincing  renal mass. Bladder mildly thick-walled. Is distended.    No discrete enlarged lymph node.  No abnormal fluid collections.    There are scattered colonic diverticula. No diverticulitis. No bowel  wall thickening or mesenteric inflammation. There is no bowel  dilation to suggest obstruction.    MUSCULOSKELETAL    Bones are demineralized. There are degenerative changes throughout  the visualized  spine. There are areas of sclerosis in the posterior  aspect of the T12 vertebrae with a small focus of sclerosis in the  T7 vertebrae. T12 focus of sclerosis is chronic unchanged from the  prior abdomen and pelvis  CT. There are no fractures. No osteolytic  lesions. There are arthropathic changes of both hip joints.    Review of the MIP images confirms the above findings.     IMPRESSION:  1. No evidence of aortic dissection or of a a rupture of the  abdominal aortic aneurysm.  2. No acute vascular conclusion.  3. Dilation of the descending thoracic aorta.  4. Excluded infrarenal abdominal aortic aneurysm. The excluded  portion of the aneurysm is widely patent.  5. 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  6. Bilateral significant hydronephrosis with bilateral ureteral  stents which appear well-positioned. The degree of hydronephrosis  and the ureteral stents are stable from the prior CT.  7. Mild bladder wall thickening. Consider cystitis in the proper  clinical setting.  8. In the chest, there is moderate cardiomegaly and dependent  atelectasis, greater on the left. No convincing pneumonia and no  pulmonary edema.      Electronically Signed    By: Lajean Manes M.D.    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 ED on 4/27 in acute respiratory distress x 1 day and was found to be hypotensive with blood pressures in the 54O sytolic and was found to be in new onset a-fib.   1.  New onset a-fib with RVR with hypotension: -In the setting of sepsis (possible scrotal cellulitis/UTI) -Requiring Neo gtt, MAP 70s -Was on amiodarone gtt at 33.3 overnight for rate control few other options for rate control given hyopotension. There is a risk of converting to NSR and CVA, family aware ----Trial decrease of amiodarone to 16.6 this morning, if heart rate ramps back up will need to go back up to full dose amiodarone -Should he decompensate may require DCCV without TEE, risks of stroke were discussed with the family in detail in the ED (alternative could potentially be death) -Ideally would like to start anticoagulation, if guiac neg and HCT stable, consider starting heparin infusion  2. Septic shock: -On neo gtt as above -Keep MAP above 60 -Keep hgb > 8.5 -ID has been consulted -On vancomycin and Azactam -Follow cultures  3. ESRD on HD: -T,Th, Sat -Possible CRRT when due  4. Hypoxia: -2/2 septic shock -Mentating better this AM  5. Anemia: -Status post transfusion -Maintain hgb > 8.5   Electronic Signatures: Idolina Primer, Eathen Budreau M (PA-C)  (Signed 28-Apr-16 08:44)  Authored: General Aspect/Present Illness, History and Physical Exam, Review of System, Family & Social History, Past Medical History, Home Medications,  Labs, EKG , Radiology, Allergies, Vital Signs/Nurse's Notes, Impression/Plan Ida Rogue (MD)  (Signed 28-Apr-16 09:37)  Authored: General Aspect/Present Illness, History and Physical Exam, Review of System, Family & Social History, Labs, EKG , Impression/Plan  Co-Signer: General Aspect/Present Illness, Home Medications, Allergies   Last Updated: 28-Apr-16 09:37 by Ida Rogue (MD)

## 2015-01-22 NOTE — Op Note (Signed)
PATIENT NAME:  [name], [name] MR#:  [id number] DATE OF BIRTH:  [birth year]  DATE OF PROCEDURE:  11/02/2014  PREOPERATIVE DIAGNOSES: 1. End-stage renal disease requiring hemodialysis.  2. Complication of dialysis catheter with occlusion of catheter.   POSTOPERATIVE DIAGNOSES:   1. End-stage renal disease requiring hemodialysis.  2. Complication of dialysis catheter with occlusion of catheter.   PROCEDURE PERFORMED: Removal of cuffed tunneled dialysis catheter.   SURGEON: Katha Cabal, MD   DESCRIPTION OF PROCEDURE: The patient is in special procedures holding area. He is positioned supine and his right neck and chest wall are prepped and draped in a sterile fashion. Cuff is easily palpable and 1% lidocaine is infiltrated into the soft tissues around the cuff. A small incision is made with a #11 blade. The cuff is exposed and dissected free from the surrounding tissues. The catheter is then removed without difficulty. Pressure is held at the base of the neck. A 4-0 Monocryl is used to close the tunnel in a simple stitch, and then the skin is closed with 4-0 Monocryl subcuticular and Dermabond. Antibiotic ointment and sterile dressing is applied at the old exit site. The patient tolerated the procedure well, and there were no immediate complications.    ____________________________ Katha Cabal, MD ggs:mw D: 11/02/2014 11:38:20 ET T: 11/02/2014 14:18:54 ET JOB#: [id number]  cc: Katha Cabal, MD, <Dictator> Katha Cabal MD ELECTRONICALLY SIGNED 11/29/2014 14:24

## 2015-01-22 NOTE — Consult Note (Signed)
Brief Consult Note: Diagnosis: 1) UTI, 2) sepsis, 3) bilateral hydronephrosis, 4) urinary retention, 5) prostate cancer 6) bladder cancer. Pt seen and examined. Full consult dictated..   Patient was seen by consultant.   Consult note dictated.   Comments: On CT bladder distended above umbilicus with bilateral stents and bilateral hydronephrosis (chronic). Hydronephrosis improved after foley placement on ultrasound (since the hydro is chronic, all of the dilation will not go away) and I believe both kidneys are adequately drained based on about 1500 ml UOP since foley and 300 ml UOP this shift, urine very clear (was rather turbid), pressors are minimal and almost off, pt back in NSR with HR 74 on monitor. I discussed options with patient - continued medical management vs. placement of nephrostomy tubes to max drain kidneys, nature r/b of each approach. I don't think nephrostomies will add to his improvement. He does want to follow-up with Urology here in [place].  Electronic Signatures: Georgette Dover (MD)  (Signed 28-Apr-16 19:15)  Authored: Brief Consult Note   Last Updated: 28-Apr-16 19:15 by Georgette Dover (MD)

## 2015-01-23 ENCOUNTER — Inpatient Hospital Stay: Payer: Medicare Other

## 2015-01-23 LAB — CBC
HCT: 23.4 % — ABNORMAL LOW (ref 40.0–52.0)
Hemoglobin: 7.3 g/dL — ABNORMAL LOW (ref 13.0–18.0)
MCH: 28.3 pg (ref 26.0–34.0)
MCHC: 31.4 g/dL — AB (ref 32.0–36.0)
MCV: 90.3 fL (ref 80.0–100.0)
Platelets: 279 10*3/uL (ref 150–440)
RBC: 2.6 MIL/uL — ABNORMAL LOW (ref 4.40–5.90)
RDW: 18.7 % — AB (ref 11.5–14.5)
WBC: 13.6 10*3/uL — ABNORMAL HIGH (ref 3.8–10.6)

## 2015-01-23 LAB — RENAL FUNCTION PANEL
ANION GAP: 18 — AB (ref 5–15)
Albumin: 2.1 g/dL — ABNORMAL LOW (ref 3.5–5.0)
BUN: 56 mg/dL — AB (ref 6–20)
CO2: 24 mmol/L (ref 22–32)
CREATININE: 9.87 mg/dL — AB (ref 0.61–1.24)
Calcium: 8 mg/dL — ABNORMAL LOW (ref 8.9–10.3)
Chloride: 101 mmol/L (ref 101–111)
GFR calc non Af Amer: 5 mL/min — ABNORMAL LOW (ref >60)
GFR, EST AFRICAN AMERICAN: 5 mL/min — AB (ref >60)
GLUCOSE: 76 mg/dL (ref 65–99)
PHOSPHORUS: 7 mg/dL — AB (ref 2.5–4.6)
POTASSIUM: 4.1 mmol/L (ref 3.5–5.1)
Sodium: 143 mmol/L (ref 135–145)

## 2015-01-23 MED ORDER — SODIUM CHLORIDE 0.9 % IV SOLN: 100.0000 mL | INTRAVENOUS | Status: DC | PRN

## 2015-01-23 NOTE — Progress Notes (Addendum)
Received prescreen request from PT for possible inpatient rehab consult. At this time, pt is requiring total assist of 2 for limited bed mobility. Pt has [employer] Medicare and pt would need to demonstrate an increased activity tolerance for our rehab program and for insurance authorization. I will follow his progress with therapies and make a formal recommendation for rehab consult in the next day or so. I am also not sure if [facility] Medicare would give authorization for inpatient rehab based on pt's current diagnosis of septic shock/respiratory distress/deconditioning.  Please call me with any questions. Thanks.  Nanetta Batty, PT Rehabilitation Admissions Coordinator [phone]

## 2015-01-23 NOTE — Progress Notes (Signed)
  SUBJECTIVE:  Review of Systems:  Subjective/Chief Complaint Admitted fro encephalopathy and SOB. Found to have Afib with RVR and hypotension in ED. Septic shock due to UTI. now he is awake and mental status is improved. Off levophed and amiodarone. Cloudy urine with some blood clots. No complaints of pain or SOB. Had multiple vomits - noted ileus, so NGT placed. Feels much better now. Sitting in chair.   Fever/Chills No   Cough No   Sputum No   Abdominal Pain No   Diarrhea No   Constipation No   Nausea/Vomiting no   SOB/DOE No   Chest Pain No   Telemetry Reviewed Afib   Dysuria No   Medications/Allergies Reviewed Medications/Allergies reviewed   VITAL SIGNS/ANCILLARY NOTES: **Vital Signs.: Filed Vitals:   01/23/15 2009  BP: 106/57  Pulse: 79  Temp: 98.5 F (36.9 C)  Resp: 18    Physical Exam:  GEN No distress.   HEENT pale conjunctivae, hearing intact to voice, dry oral mucosa,NGT in place.   NECK supple  No masses   RESP normal resp effort  clear BS   CARD irregular rate  No LE edema   ABD denies tenderness , distended, soft  sluggish BS.   GU foley catheter in place  cloudy urine   EXTR negative cyanosis/clubbing, negative edema   SKIN No rashes, No ulcers   NEURO negative rigidity, negative tremor, follows commands   PSYCH alert, A+O to time, place, person   ASSESSMENT/PLAN:  Assessment/Plan:  Orders/Results reviewed Orders reviewed and updated, Laboratory results reviewed, Radiology results reviewed, Vital Signs reviewed, Nursing documentation reviewed   Assessment/Plan 56 m with Bladder/prostate cancer, ESRD on HD, hypertension here with confusion, SOB  * Septic shock due to UTI   Had ureteral stents- seen by urology and ID.  Was On Levophed for Map >65- improved- BP   stable.  Contiued broad spectrum abx. May taper soon.  negative final cx. Appreciated ID consult.   repeated cx just 2000CFU gr neg rods.  * Afib with RVR due to  sepsis was On amiodarone drip. Appreciate cardiology help- now swiched to oral amiodarone.   As per cardiology eventually need Cath- no urgency.  * Ileus   NPO for now, NGT suction.   Monitor. Encourage PT.   Repeat Xray- less distension today- follow daily.  * ESRD on HD   manage per nephrology  * Anemia of Chronic Disease with worsening  Transfused 1 unit PRBC   * Acute resp failure  Aspiration?  Monitor.   Now on room air.    Discharge Planning SNF/LTC   Code Status Full Code   Critical Care no, Total patient care time:, 35 minutes   Case Discussed With patient, family, nursing

## 2015-01-23 NOTE — Progress Notes (Signed)
[facility]  ROUNDING NOTE   Subjective:   NGT: 1200 Patient eating ice chips.  Last hemodialysis was Saturday.   Objective:  Vital signs in last 24 hours:  Temp:  [97.6 F (36.4 C)-98.5 F (36.9 C)] 98.2 F (36.8 C) (05/02 0747) Pulse Rate:  [77-78] 77 (05/02 0747) Resp:  [18-20] 20 (05/02 0747) BP: (100-109)/(50-62) 109/62 mmHg (05/02 0747) SpO2:  [98 %-100 %] 99 % (05/02 0747)  Weight change:  Filed Weights   01/20/15 1300 01/22/15 0500  Weight: 101.8 kg (224 lb 6.9 oz) 101 kg (222 lb 10.6 oz)    Intake/Output: I/O last 3 completed shifts: In: 497 [I.V.:397; NG/GT:100] Out: 2025 [Urine:625; Emesis/NG output:1400]   Intake/Output this shift:  Total I/O In: 72.8 [I.V.:52.8; NG/GT:20] Out: 200 [Emesis/NG output:200]  General: not in acute distress Head: +OGT to suction Neck: supple CVS: regular rate and rhythm Resp: clear  ABD: +distended EXT: no edema Neuro: alert and oriented Access: Left arm AVF   Basic Metabolic Panel:  Recent Labs Lab 01/18/15 1707 01/19/15 0626 01/20/15 0430 01/22/15 0435  NA 140 138 141 142  K 3.3* 3.5 3.6 3.9  CL 96* 99* 99* 98*  CO2 27 27 30 31   GLUCOSE 213* 133* 119* 94  BUN 37* 41* 32* 38*  CREATININE 7.94* 8.24* 6.73* 8.36*  CALCIUM 8.1* 7.9* 7.8* 7.9*  PHOS  --   --   --  5.3*    Liver Function Tests: No results for input(s): AST, ALT, ALKPHOS, BILITOT, PROT, ALBUMIN in the last 168 hours. No results for input(s): LIPASE, AMYLASE in the last 168 hours. No results for input(s): AMMONIA in the last 168 hours.  CBC:  Recent Labs Lab 01/18/15 1707 01/19/15 0626 01/20/15 0430 01/22/15 0435  WBC 14.0* 14.1* 11.7* 12.9*  NEUTROABS  --  10.4* 9.5*  --   HGB 6.9* 7.3* 6.9* 7.0*  HCT 22.3* 22.3* 21.5* 22.0*  MCV 91 89 89 89.0  PLT 197 208 176 220    Cardiac Enzymes: No results for input(s): CKTOTAL, CKMB, CKMBINDEX, TROPONINI in the last 168 hours.  BNP: Invalid input(s): POCBNP  CBG: No  results for input(s): GLUCAP in the last 168 hours.  Microbiology: Results for orders placed or performed during the hospital encounter of 01/18/15  Culture, blood (single)     Status: None (Preliminary result)   Collection Time: 01/18/15  7:11 PM  Result Value Ref Range Status   Micro Text Report   Preliminary       COMMENT                   NO GROWTH IN 46 HOURS   ANTIBIOTIC                                                      Culture, blood (single)     Status: None (Preliminary result)   Collection Time: 01/18/15  7:11 PM  Result Value Ref Range Status   Micro Text Report   Preliminary       COMMENT                   NO GROWTH IN 48 HOURS   ANTIBIOTIC  Urine culture     Status: None   Collection Time: 01/18/15  7:36 PM  Result Value Ref Range Status   Micro Text Report   Final       SOURCE: INDWELLING CATHETER    COMMENT                   NO GROWTH IN 36 HOURS   ANTIBIOTIC                                                      Urine culture     Status: None (Preliminary result)   Collection Time: 01/20/15  4:09 PM  Result Value Ref Range Status   Micro Text Report   Preliminary       SOURCE: INDWELLING CATH    ORGANISM 1                2000 CFU/ML GRAM NEGATIVE ROD   COMMENT                   ID TO FOLLOW SENSITIVITIES TO FOLLOW   COMMENT                   REPEATING ID AND SENSITIVIES   ANTIBIOTIC                                                        Coagulation Studies: No results for input(s): LABPROT, INR in the last 72 hours.  Urinalysis: No results for input(s): COLORURINE, LABSPEC, PHURINE, GLUCOSEU, HGBUR, BILIRUBINUR, KETONESUR, PROTEINUR, UROBILINOGEN, NITRITE, LEUKOCYTESUR in the last 72 hours.  Invalid input(s): APPERANCEUR    Imaging: Dg Abd 1 View  01/22/2015   CLINICAL DATA:  74 year old male with a history of abdominal pain. Nausea  EXAM: ABDOMEN - 1 VIEW  COMPARISON:  01/21/2015,  01/18/2015  FINDINGS: Two frontal supine images.  Multiple distended small bowel loops, as was seen on the comparison study. Paucity of colonic gas. Small amount of retained enteric contrast within the left colon. The enteric contrast was not present on the comparison plain film study, and has progressed through the colon in the last 24 hours.  No definite evidence of free air.  Diffuse osteopenia.  No acute fracture.  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 Changes of bilateral hypogastric artery coiling.  Wire-less pressure monitor to the left lateral aspect of the endograft.  Bilateral double-J ureteral stents in place.  IMPRESSION: Persisting dilated small bowel loops, either an ileus or small-bowel obstruction. If there is concern for acute intra abdominal process, CT would be indicated.  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 A wireless intrasac pressure monitor (CardioMEMES device) projects to the left aspect of the graft.  Unchanged position of bilateral double-J ureteral stents.  Signed,  Dulcy Fanny. Earleen Newport, DO  [facility]  [facility]iology   Electronically Signed   By: Corrie Mckusick D.O.   On: 01/22/2015 09:40     Medications:   . sodium chloride     . [START ON 01/27/2015] amiodarone  200 mg Oral BID  . [START ON 02/03/2015] amiodarone  200 mg Oral Daily  . amiodarone  400 mg Oral BID  . atorvastatin  40 mg Oral Daily  . meropenem (MERREM) IV  500 mg Intravenous Q24H  . sodium chloride  5 mL Intravenous Q12H  . [START ON 01/24/2015] vancomycin  1,000 mg Intravenous Q T,Th,Sa-HD   acetaminophen, ondansetron (ZOFRAN) IV, phenol, sodium chloride, sodium chloride  Assessment/ Plan:  74 y.o. 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 Last hemodialysis treatment was Saturday.  - Plan for next dialysis treatment for tomorrow.   2. Anemia of chronic kidney disease: D63.1 hgb 7.0  - holding epo due to prostate cancer.   3. Secondary Hyperparathyroidism: N25.81 phos at goal. Currently NPO and not on binders.   4. Ileus: K56.7 NGT to suction. Continue supportive care.   5. A. Fib with RVR: I48.91 converted to sinus rhythm, on amiodarone.    LOS: 5 Daaiel Starlin 5/2/201610:55 AM

## 2015-01-23 NOTE — Progress Notes (Signed)
Chart reviewed. Pt has NGT and no diet ordered. Will f/u once NGT has been removed and PO's are started again.

## 2015-01-23 NOTE — Progress Notes (Signed)
MEDICATION RELATED CONSULT NOTE - FOLLOW UP   Pharmacy Consult for Renal dosing in HD Patient Indication: Medication adjustments in HD patient  Allergies  Allergen Reactions  . Penicillins Hives    Patient Measurements: Height: 5' 6.97" (170.1 cm) Weight: 222 lb 10.6 oz (101 kg) IBW/kg (Calculated) : 66.03   Labs:  Recent Labs  01/22/15 0435  WBC 12.9*  HGB 7.0*  HCT 22.0*  PLT 220  CREATININE 8.36*  PHOS 5.3*   Estimated Creatinine Clearance: 8.9 mL/min (by C-G formula based on Cr of 8.36).     Medications:  Prescriptions prior to admission  Medication Sig Dispense Refill Last Dose  . amLODipine (NORVASC) 5 MG tablet Take 5 mg by mouth at bedtime.     . budesonide-formoterol (SYMBICORT) 160-4.5 MCG/ACT inhaler Inhale 2 puffs into the lungs 2 (two) times daily.     . chlorpheniramine (CHLOR-TRIMETON) 4 MG tablet Take 4 mg by mouth every 4 (four) hours as needed for allergies.     Marland Kitchen loperamide (IMODIUM) 2 MG capsule Take 2 mg by mouth every 4 (four) hours as needed for diarrhea or loose stools.     . magnesium oxide (MAG-OX) 400 MG tablet Take 400 mg by mouth at bedtime.     . NONFORMULARY OR COMPOUNDED ITEM Take 1 tablet by mouth daily. Rena-Vite Vitamin B Complex with C and Folic Acid     . sevelamer carbonate (RENVELA) 800 MG tablet Take 800 mg by mouth 3 (three) times daily with meals.     . Tiotropium Bromide Monohydrate 2.5 MCG/ACT AERS Inhale 1 puff into the lungs daily.      Scheduled:  . [START ON 01/27/2015] amiodarone  200 mg Oral BID  . [START ON 02/03/2015] amiodarone  200 mg Oral Daily  . amiodarone  400 mg Oral BID  . atorvastatin  40 mg Oral Daily  . meropenem (MERREM) IV  500 mg Intravenous Q24H  . sodium chloride  5 mL Intravenous Q12H  . [START ON 01/24/2015] vancomycin  1,000 mg Intravenous Q T,Th,Sa-HD    Assessment:  Patient with ESRD requiring HD on Tues, Thurs, Sat schedule. Next HD scheduled for Tuesday, 5/3.   Goal of Therapy:   Adjustment of medications for renal function.  Plan:  No renal adjustments warranted at this time.  Will continue to follow. Larene Beach, PharmD

## 2015-01-23 NOTE — Care Management Note (Signed)
Case Management Note  Patient Details  Name: [name] MRN: [id number] Date of Birth: 20-Nov-1940  Subjective/Objective:   CM assessment                 Action/Plan:  Discharge to SNF  Expected Discharge Date:                  Expected Discharge Plan:  [place]  In-House Referral:     Discharge planning Services  CM Consult  Post Acute Care Choice:    Choice offered to:     DME Arranged:    DME Agency:     HH Arranged:    HH Agency:     Status of Service:  In process, will continue to follow  Medicare Important Message Given:    Date Medicare IM Given:    Medicare IM give by:    Date Additional Medicare IM Given:    Additional Medicare Important Message give by:     If discussed at [place] of Stay Meetings, dates discussed:    Additional Comments: Attempted to see pt and he is not available. TC to niece, Concepcion Elk [phone]), no answer and no voice mail. Per notes pt lives at home alone. He is active with [facility], [address], T/THUR/SAT. Pt did not meet criteria for acute inpatient rehab. CSW following for SNF placement.   Jolly Mango 01/23/2015, 3:01 PM

## 2015-01-23 NOTE — Progress Notes (Signed)
Physical Therapy Treatment Patient Details Name: [name] MRN: [id number] DOB: 1941/08/04 Today's Date: 01/23/2015    History of Present Illness 74 yo male with onset of septic shock and respriatory distress, hypotension and atelectasis, recent bladder history, PMHx:  a-fib with RVR, prostate CA, ESRD T TH S, neurogenic bladder, AAA, anemia,     PT Comments    Pt was seen for evaluation of current function and notably tolerating sitting better to get OOB.  Pt is elated to be up and is looking forward to getting to rehab eventually.  His next plan is to try to fully stand if able and focus on active use of LE's  Follow Up Recommendations  CIR;Supervision/Assistance - 24 hour     Equipment Recommendations  None recommended by PT    Recommendations for Other Services Rehab consult     Precautions / Restrictions Precautions Precautions: Fall Restrictions Weight Bearing Restrictions: No    Mobility  Bed Mobility Overal bed mobility: Needs Assistance Bed Mobility: Supine to Sit     Supine to sit: Mod assist;Max assist     General bed mobility comments: Pt is requiring assistance due to stiffness in joints  Transfers Overall transfer level: Needs assistance Equipment used: 2 person hand held assist Transfers: Lateral/Scoot Transfers;Sit to/from Stand Sit to Stand: Max assist;+2 physical assistance;From elevated surface;+2 safety/equipment        Lateral/Scoot Transfers: Max assist;+2 physical assistance;+2 safety/equipment;From elevated surface (Using bed pad) General transfer comment: partial standing wtih bed pad and nursing to slide after standing partially  Ambulation/Gait             General Gait Details: unable   Stairs            Wheelchair Mobility    Modified Rankin (Stroke Patients Only)       Balance Overall balance assessment: Needs assistance Sitting-balance support: Feet supported;Bilateral upper extremity supported Sitting  balance-Leahy Scale: Fair Sitting balance - Comments: reminders to control his midline awareness and to give him cues for hnd placement Postural control: Posterior lean Standing balance support: Bilateral upper extremity supported Standing balance-Leahy Scale: Poor                      Cognition Arousal/Alertness: Awake/alert Behavior During Therapy: WFL for tasks assessed/performed Overall Cognitive Status: Within Functional Limits for tasks assessed                      Exercises      General Comments General comments (skin integrity, edema, etc.): Lab values are still low but could tolerate sitting better to get to chair      Pertinent Vitals/Pain Pain Assessment: No/denies pain    Home Living                      Prior Function            PT Goals (current goals can now be found in the care plan section) Acute Rehab PT Goals Patient Stated Goal: to get walking again Progress towards PT goals: Progressing toward goals    Frequency  Min 2X/week    PT Plan Current plan remains appropriate    Co-evaluation             End of Session Equipment Utilized During Treatment: Oxygen Activity Tolerance: Patient tolerated treatment well Patient left: with call bell/phone within reach;with bed alarm set;with family/visitor present;in chair     Time: 2263-3354  PT Time Calculation (min) (ACUTE ONLY): 35 min  Charges:  $Therapeutic Activity: 23-37 mins                    G Codes:      Ramond Dial 2015-02-22, 1:59 PM   Mee Hives, PT MS Acute Rehab Dept. Number: [phone]

## 2015-01-23 NOTE — Progress Notes (Signed)
Patient: [name] / Admit Date: 01/18/2015 / Date of Encounter: 01/23/2015, 12:23 PM   Subjective: Feels well this morning. Wants to get up into chair. He believes he has had several episodes of flatus. He wants to eat. No chest pain, palpitations, or increased SOB.   Review of Systems: Review of Systems  Constitutional: Positive for malaise/fatigue. Negative for fever, chills, weight loss and diaphoresis.  Respiratory: Positive for shortness of breath and wheezing. Negative for cough, hemoptysis and sputum production.   Cardiovascular: Negative for chest pain, palpitations, orthopnea, claudication, leg swelling and PND.  Gastrointestinal: Positive for constipation. Negative for heartburn, nausea, vomiting, abdominal pain, diarrhea, blood in stool and melena.  Skin: Negative for itching and rash.  Neurological: Positive for weakness. Negative for dizziness and loss of consciousness.  Psychiatric/Behavioral: The patient is not nervous/anxious.     Objective: Telemetry: NSR, 70s, 10 beats of NSVT Physical Exam: Blood pressure 109/62, pulse 77, temperature 98.2 F (36.8 C), temperature source Oral, resp. rate 20, height 5' 6.97" (1.701 m), weight 222 lb 10.6 oz (101 kg), SpO2 99 %. Body mass index is 34.91 kg/(m^2). General: Well developed, well nourished, in no acute distress. Head: Normocephalic, atraumatic, sclera non-icteric, no xanthomas, nares are without discharge. NG tube present.  Neck: Negative for carotid bruits. JVP not elevated. Lungs: Faint bilateral crackles. Breathing is unlabored. Heart: RRR S1 S2 without murmurs, rubs, or gallops.  Abdomen: Soft, non-tender, non-distended with normoactive bowel sounds. No rebound/guarding. Extremities: No clubbing or cyanosis. No edema. Distal pedal pulses are 2+ and equal bilaterally. Neuro: Alert and oriented X 3. Moves all extremities spontaneously. Psych:  Responds to questions appropriately with a normal  affect.   Intake/Output Summary (Last 24 hours) at 01/23/15 1223 Last data filed at 01/23/15 1106  Gross per 24 hour  Intake 372.75 ml  Output   1950 ml  Net -1577.25 ml    Inpatient Medications:  . [START ON 01/27/2015] amiodarone  200 mg Oral BID  . [START ON 02/03/2015] amiodarone  200 mg Oral Daily  . amiodarone  400 mg Oral BID  . atorvastatin  40 mg Oral Daily  . meropenem (MERREM) IV  500 mg Intravenous Q24H  . sodium chloride  5 mL Intravenous Q12H  . [START ON 01/24/2015] vancomycin  1,000 mg Intravenous Q T,Th,Sa-HD   Infusions:  . sodium chloride      Labs:  Recent Labs  01/22/15 0435  NA 142  K 3.9  CL 98*  CO2 31  GLUCOSE 94  BUN 38*  CREATININE 8.36*  CALCIUM 7.9*  PHOS 5.3*   No results for input(s): AST, ALT, ALKPHOS, BILITOT, PROT, ALBUMIN in the last 72 hours.  Recent Labs  01/22/15 0435  WBC 12.9*  HGB 7.0*  HCT 22.0*  MCV 89.0  PLT 220   No results for input(s): CKTOTAL, CKMB, TROPONINI in the last 72 hours. Invalid input(s): POCBNP No results for input(s): HGBA1C in the last 72 hours.   Weights: Filed Weights   01/20/15 1300 01/22/15 0500  Weight: 224 lb 6.9 oz (101.8 kg) 222 lb 10.6 oz (101 kg)     Radiology/Studies:  Dg Abd 1 View  01/22/2015   CLINICAL DATA:  74 year old male with a history of abdominal pain. Nausea  EXAM: ABDOMEN - 1 VIEW  COMPARISON:  01/21/2015, 01/18/2015  FINDINGS: Two frontal supine images.  Multiple distended small bowel loops, as was seen on the comparison study. Paucity of colonic gas. Small amount of retained  enteric contrast within the left colon. The enteric contrast was not present on the comparison plain film study, and has progressed through the colon in the last 24 hours.  No definite evidence of free air.  Diffuse osteopenia.  No acute fracture.  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 Changes of bilateral hypogastric artery  coiling.  Wire-less pressure monitor to the left lateral aspect of the endograft.  Bilateral double-J ureteral stents in place.  IMPRESSION: Persisting dilated small bowel loops, either an ileus or small-bowel obstruction. If there is concern for acute intra abdominal process, CT would be indicated.  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 A wireless intrasac pressure monitor (CardioMEMES device) projects to the left aspect of the graft.  Unchanged position of bilateral double-J ureteral stents.  Signed,  Dulcy Fanny. Earleen Newport, DO  [facility]  [facility]   Electronically Signed   By: Corrie Mckusick D.O.   On: 01/22/2015 09:40   Dg Abd 1 View  01/21/2015   CLINICAL DATA:  Abdominal pain  EXAM: ABDOMEN - 1 VIEW  COMPARISON:  CT abdomen and pelvis January 18, 2015  FINDINGS: There are double-J stents bilaterally. There is also an aorto bi-iliac stent for aneurysm. There is generalized bowel dilatation in a pattern suggesting ileus. No free air is seen on this supine examination. There are no air-fluid levels. There is atelectatic change in the lung bases. There are coils in the pelvis, presumably due to internal iliac artery embolization.  IMPRESSION: Bowel gas pattern most consistent with ileus. No free air seen. Double-J stents present as well as aortoiliac stent. Coils are present in the pelvis as well.   Electronically Signed   By: Lowella Grip III M.D.   On: 01/21/2015 10:12   Dg Chest Port 1 View  01/18/2015   CLINICAL DATA:  74 year old male status post central line placement  EXAM: PORTABLE CHEST - 1 VIEW  COMPARISON:  CT scan chest/abdomen/ pelvis obtained earlier today ; most recent prior chest x-ray 07/08/2014  FINDINGS: External defibrillator pad projects over the chest. Interval placement of a right IJ approach central venous catheter. The catheter is somewhat lateral in position. The tip is  in the region of the distal SVC. Increased enlargement of the cardiopericardial silhouette. And atherosclerotic thoracic aorta. Pulmonary vascular congestion. All  IMPRESSION: 1. The tip of the right IJ approach central venous catheter is in the region of the distal SVC although it is slightly more lateral than expected. Recommend clinical correlation to insure that the catheter is within the venous system. 2. No evidence of pneumothorax or new pleural effusion. 3. Cardiomegaly. 4. Pulmonary vascular congestion, low lung volumes and bibasilar atelectasis.   Electronically Signed   By: Jacqulynn Cadet M.D.   On: 01/18/2015 21:22   Ct Angiography Chest/abd/pelvis ([facility] Hx)  01/18/2015   CLINICAL DATA:  Patient to ED via EMS from home with c.o respiratory distress, atrial fib, and groin pain.  EXAM: CT ANGIOGRAPHY CHEST, ABDOMEN AND PELVIS  TECHNIQUE: Multidetector CT imaging through the chest, abdomen and pelvis was performed using the standard protocol during bolus administration of intravenous contrast. Multiplanar reconstructed images and MIPs were obtained and reviewed to evaluate the vascular anatomy.  CONTRAST:  100 mL of Omnipaque 350 intravenous contrast.  COMPARISON:  None.  FINDINGS: CTA CHEST FINDINGS  Thoracic aorta shows atherosclerotic irregularity along the arch and descending  aorta. There is mild dilation of the thoracic aorta most prominently of the descending aorta where it measures 4.8 cm. No aortic dissection. The aortic branch vessels are widely patent. Mild atherosclerotic calcifications are noted the base of the left subclavian artery and along the innominate artery.  The heart is moderately enlarged. There are moderate coronary artery calcifications. No neck base or axillary masses or adenopathy. No mediastinal or hilar masses or adenopathy. There is lower lobe opacity, left greater than right, as well as dependent left upper lobe opacity, most likely all atelectasis. Pneumonia is not  excluded but felt less likely. There is no pulmonary edema. 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 Minimal left pleural effusion. No pneumothorax.  Review of the MIP images confirms the above findings.  CTA ABDOMEN AND PELVIS FINDINGS  There is an excluded infrarenal abdominal aortic aneurysm. Native aneurysm measures 5.4 cm x 4.7 cm. The excluded lumen is widely patent. Stents extend into the external iliac arteries, also widely patent. There is dilation of the common femoral arteries. There is an aneurysm or pseudoaneurysm surrounding the right common femoral artery and proximal superficial femoral and deep femoral arteries, measuring 5.7 cm x 5.3 cm. There is no evidence of aortic rupture.  There is significant narrowing at the origin of the celiac axis, measuring 70%. The superior mesenteric artery is widely patent. Mild plaque is noted on the renal arteries without significant stenosis.  Liver, spleen, gallbladder, pancreas: Unremarkable. There is mild thickening of the adrenal glands suggesting nodular hyperplasia. This is stable. There is significant bilateral hydronephrosis. Bilateral ureteral stents extend from the renal pelves to the bladder. There is bilateral renal cortical thinning. No convincing renal mass. Bladder mildly thick-walled. Is distended.  No discrete enlarged lymph node.  No abnormal fluid collections.  There are scattered colonic diverticula. No diverticulitis. No bowel wall thickening or mesenteric inflammation. There is no bowel dilation to suggest obstruction.  MUSCULOSKELETAL  Bones are demineralized. There are degenerative changes throughout the visualized spine. There are areas of sclerosis in the posterior aspect of the T12 vertebrae with a small focus of sclerosis in the T7 vertebrae. T12 focus of sclerosis is chronic unchanged from the prior abdomen and pelvis CT. There are no fractures. No osteolytic lesions. There are arthropathic changes of both hip joints.  Review  of the MIP images confirms the above findings.  IMPRESSION: 1. No evidence of aortic dissection or of a a rupture of the abdominal aortic aneurysm. 2. No acute vascular conclusion. 3. Dilation of the descending thoracic aorta. 4. Excluded infrarenal abdominal aortic aneurysm. The excluded portion of the aneurysm is widely patent. 5. 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 6. Bilateral significant hydronephrosis with bilateral ureteral stents which appear well-positioned. The degree of hydronephrosis and the ureteral stents are stable from the prior CT. 7. Mild bladder wall thickening. Consider cystitis in the proper clinical setting. 8. In the chest, there is moderate cardiomegaly and dependent atelectasis, greater on the left. No convincing pneumonia and no pulmonary edema.   Electronically Signed   By: Lajean Manes M.D.   On: 01/18/2015 18:41   US Kidneys ([facility] Hx)  01/19/2015   ADDENDUM REPORT: 01/19/2015 16:52  ADDENDUM: When directly comparing to recent CT, the hydronephrosis in the right kidney is moderately improved, particularly in the interpolar region and lower pole collecting system. The hydronephrosis in the left kidney is mildly improved.  Notably, both kidneys are likely mildly improved  when compared to CT dated 06/24/2014, indicating that the overall appearance is chronic.   Electronically Signed   By: Julian Hy M.D.   On: 01/19/2015 16:52   01/19/2015   CLINICAL DATA:  Bilateral hydronephrosis  EXAM: RENAL / URINARY TRACT ULTRASOUND COMPLETE  COMPARISON:  CT abdomen pelvis dated 01/18/2015  FINDINGS: Right Kidney:  Length: 11.7 cm. Moderate hydronephrosis. Indwelling ureteral stent.  Left Kidney:  Length: 11.9 cm. Moderate hydronephrosis. Indwelling ureteral stent.  Bladder:  Decompressed by indwelling Foley catheter.  IMPRESSION: Moderate hydronephrosis with indwelling ureteral stents bilaterally.  Bladder decompressed by  indwelling Foley catheter.  Electronically Signed: By: Julian Hy M.D. On: 01/19/2015 16:23     Assessment and Plan  74 year old male with history of ESRD on HD Tuesday, Thursday, and Saturday, prostate CA s/p BCG injections and Lupron injections, bladder CA s/p ureteral stents and neurogenic bladder, abdominal aortic aneurysm and iliac aneurysm, status post repair who presented to [facility] ED on 4/27 in acute respiratory distress x 1 day and was found to be hypotensive with blood pressures in the 98P sytolic and was found to be in new onset a-fib.  1. New onset a-fib with RVR and hypotension:  -Remains in NSR with HR in the 70s -Continue amiodarone 400 mg bid, will change to 200 mg bid on 5/6, followed by 200 mg daily on 5/13 -Blood pressure continues to be somewhat soft in the low 100s/60s limiting usage of beta blocker (10 beats of NSVT seen on tele), continue amiodarone, could add at later date once BP continues to improve -Continue to hold anticoagulation given anemia (hgb 7.0) -Off pressors, MAP 70s -Risk of converting to NSR and stroke was discussed with family, they were ok with this  2. 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 Septic shock/UTI: -On Meropenem and vancomycin per IM  -Off pressors, MAP 70s as above  4. ESRD on HD: -Tues, Thursday, Saturday -Per nephrology  5. Hypoxia: -2/2 septic shock -Stable   6. SBO: -NG tube in place -Possible flatus -He wants to eat, defer transition of po to primary team   7. Anemia: -Hgb 7.0 on 5/1 -Pending this morning -Goal hgb 8 to 8.5   Signed, Christell Faith, PA-C

## 2015-01-24 ENCOUNTER — Encounter: Payer: Self-pay | Admitting: Internal Medicine

## 2015-01-24 ENCOUNTER — Inpatient Hospital Stay: Payer: Medicare Other

## 2015-01-24 DIAGNOSIS — D649 Anemia, unspecified: Secondary | ICD-10-CM | POA: Diagnosis present

## 2015-01-24 DIAGNOSIS — Z992 Dependence on renal dialysis: Secondary | ICD-10-CM

## 2015-01-24 DIAGNOSIS — N186 End stage renal disease: Secondary | ICD-10-CM

## 2015-01-24 DIAGNOSIS — I5042 Chronic combined systolic (congestive) and diastolic (congestive) heart failure: Secondary | ICD-10-CM | POA: Diagnosis present

## 2015-01-24 DIAGNOSIS — Z8719 Personal history of other diseases of the digestive system: Secondary | ICD-10-CM

## 2015-01-24 LAB — CBC
HEMATOCRIT: 22.4 % — AB (ref 40.0–52.0)
HEMOGLOBIN: 7.3 g/dL — AB (ref 13.0–18.0)
MCH: 29.3 pg (ref 26.0–34.0)
MCHC: 32.6 g/dL (ref 32.0–36.0)
MCV: 89.7 fL (ref 80.0–100.0)
Platelets: 271 10*3/uL (ref 150–440)
RBC: 2.5 MIL/uL — ABNORMAL LOW (ref 4.40–5.90)
RDW: 18.7 % — ABNORMAL HIGH (ref 11.5–14.5)
WBC: 11.5 10*3/uL — AB (ref 3.8–10.6)

## 2015-01-24 LAB — MAGNESIUM: Magnesium: 2.2 mg/dL (ref 1.7–2.4)

## 2015-01-24 LAB — HEPATITIS B SURFACE ANTIBODY, QUANTITATIVE: Hepatitis B-Post: <3.1 m[IU]/mL — ABNORMAL LOW (ref >9.9)

## 2015-01-24 MED ORDER — BISACODYL 10 MG RE SUPP
10.0000 mg | Freq: Once | RECTAL | Status: AC
  Administered 2015-01-24: 10 mg via RECTAL
  Filled 2015-01-24: qty 1

## 2015-01-24 MED ORDER — DILTIAZEM HCL 60 MG PO TABS
60.0000 mg | ORAL_TABLET | Freq: Three times a day (TID) | ORAL | Status: DC
  Administered 2015-01-25 – 2015-01-28 (×7): 60 mg via ORAL
  Filled 2015-01-24 (×11): qty 1

## 2015-01-24 MED ORDER — CIPROFLOXACIN IN D5W 400 MG/200ML IV SOLN
400.0000 mg | INTRAVENOUS | Status: DC
  Administered 2015-01-24 – 2015-01-26 (×3): 400 mg via INTRAVENOUS
  Filled 2015-01-24 (×3): qty 200

## 2015-01-24 NOTE — Progress Notes (Signed)
  SUBJECTIVE:  Review of Systems:  Subjective/Chief Complaint Admitted fro encephalopathy and SOB. Found to have Afib with RVR and hypotension in ED. Septic shock due to UTI. now he is awake and mental status is improved. Off levophed and amiodarone. Cloudy urine with some blood clots. No complaints of pain or SOB. Had multiple vomits - noted ileus, so NGT placed. Feels much better now. Still no BM or passing gas.    Fever/Chills No   Cough No   Sputum No   Abdominal Pain No   Diarrhea No   Constipation No   Nausea/Vomiting no   SOB/DOE No   Chest Pain No   Telemetry Reviewed Afib   Dysuria No   Medications/Allergies Reviewed Medications/Allergies reviewed   VITAL SIGNS/ANCILLARY NOTES: **Vital Signs.: Filed Vitals:   01/24/15 1608  BP: 92/55  Pulse: 84  Temp: 98.4 F (36.9 C)  Resp: 20    Physical Exam:  GEN No distress.   HEENT pale conjunctivae, hearing intact to voice, dry oral mucosa,NGT in place.   NECK supple  No masses   RESP normal resp effort  clear BS   CARD irregular rate  No LE edema   ABD denies tenderness , distended, soft  sluggish BS.   GU foley catheter in place  cloudy urine   EXTR negative cyanosis/clubbing, negative edema   SKIN No rashes, No ulcers   NEURO negative rigidity, negative tremor, follows commands   PSYCH alert, A+O to time, place, person   ASSESSMENT/PLAN:  Assessment/Plan:  Orders/Results reviewed Orders reviewed and updated, Laboratory results reviewed, Radiology results reviewed, Vital Signs reviewed, Nursing documentation reviewed   Assessment/Plan 58 m with Bladder/prostate cancer, ESRD on HD, hypertension here with confusion, SOB  * Septic shock due to UTI   Had ureteral stents- seen by urology and ID.   Was On Levophed for Map >65- improved-     BP   stable.  Continued broad spectrum abx. May taper soon.  negative final cx. Appreciated ID consult.   repeated cx just 2000CFU gr neg rods.  * Afib  with RVR due to sepsis was On amiodarone drip. Appreciate cardiology help- now swiched to oral amiodarone.   As per cardiology eventually need Cath- no urgency.  due to tachycardia- added cardizem  * Ileus   NPO for now, NGT suction. Still no BM or passing gas. Give dulcolax suppository.   Monitor. Encourage PT.   Repeat Xray- less distension today- follow daily.  * ESRD on HD   manage per nephrology  * Anemia of Chronic Disease with worsening  Transfused 1 unit PRBC   * Acute resp failure  Aspiration?  Monitor.   Now on room air.    Discharge Planning SNF/LTC   Code Status Full Code   Critical Care no, Total patient care time:, 35 minutes   Case Discussed With patient, family, nursing

## 2015-01-24 NOTE — Progress Notes (Signed)
Date of Admission:  01/18/2015       ID: [name] is a 74 y.o. male with  Complicated UTI   Active Problems:   Atrial fibrillation with RVR  Subjective:  Remains afebrile. Some nausea, has ngt in place. Foley in place  Day 7 vanco and meropenem Medications:   [START ON 01/27/2015] amiodarone  200 mg Oral BID   [START ON 02/03/2015] amiodarone  200 mg Oral Daily   amiodarone  400 mg Oral BID   atorvastatin  40 mg Oral Daily   meropenem (MERREM) IV  500 mg Intravenous Q24H   sodium chloride  5 mL Intravenous Q12H   vancomycin  1,000 mg Intravenous Q T,Th,Sa-HD    Objective: Vital signs in last 24 hours: Temp:  [97.7 F (36.5 C)-98.5 F (36.9 C)] 97.7 F (36.5 C) (05/03 1313) Pulse Rate:  [75-89] 81 (05/03 1313) Resp:  [17-25] 24 (05/03 1313) BP: (106-120)/(55-75) 108/75 mmHg (05/03 1313) SpO2:  [98 %-100 %] 98 % (05/03 1313) Weight:  [97 kg (213 lb 13.5 oz)-97.8 kg (215 lb 9.8 oz)] 97.8 kg (215 lb 9.8 oz) (05/03 1313)  NGT in place, foley in place General appearance: alert Head: Normocephalic, without obvious abnormality, atraumatic Eyes: conjunctivae/corneas clear. PERRL, EOM's intact. Fundi benign. Ears: normal TM's and external ear canals both ears Resp: clear to auscultation bilaterally Chest wall: no tenderness GI: soft, non-tender; bowel sounds normal; no masses,  no organomegaly Extremities: extremities normal, atraumatic, no cyanosis or edema Skin: Skin color, texture, turgor normal. No rashes or lesions  Lab Results  Recent Labs  01/22/15 0435 01/23/15 1445 01/24/15 0527  WBC 12.9* 13.6* 11.5*  HGB 7.0* 7.3* 7.3*  HCT 22.0* 23.4* 22.4*  NA 142 143  --   K 3.9 4.1  --   CL 98* 101  --   CO2 31 24  --   BUN 38* 56*  --   CREATININE 8.36* 9.87*  --    Liver Panel  Recent Labs  01/23/15 1445  ALBUMIN 2.1*    Microbiology: Recent Results (from the past 240 hour(s))  Culture, blood (single)     Status: None (Preliminary result)    Collection Time: 01/18/15  7:11 PM  Result Value Ref Range Status   Micro Text Report   Preliminary       COMMENT                   NO GROWTH IN 51 HOURS   ANTIBIOTIC                                                      Culture, blood (single)     Status: None (Preliminary result)   Collection Time: 01/18/15  7:11 PM  Result Value Ref Range Status   Micro Text Report   Preliminary       COMMENT                   NO GROWTH IN 48 HOURS   ANTIBIOTIC  Urine culture     Status: None   Collection Time: 01/18/15  7:36 PM  Result Value Ref Range Status   Micro Text Report   Final       SOURCE: INDWELLING CATHETER    COMMENT                   NO GROWTH IN 36 HOURS   ANTIBIOTIC                                                      Urine culture     Status: None (Preliminary result)   Collection Time: 01/20/15  4:09 PM  Result Value Ref Range Status   Micro Text Report   Preliminary       SOURCE: INDWELLING CATH    ORGANISM 1                2000 CFU/ML GRAM NEGATIVE ROD   COMMENT                   ID TO FOLLOW SENSITIVITIES TO FOLLOW   COMMENT                   REPEATING ID AND SENSITIVIES   ANTIBIOTIC                                                       Studies/Results: Dg Abd 1 View  01/24/2015   CLINICAL DATA:  Ileus.  EXAM: ABDOMEN - 1 VIEW  COMPARISON:  01/23/2015.  01/22/2015.  FINDINGS: Bilateral double-J ureteral stents noted. Prior aortoiliac stent graft placement with bilateral hypogastric coils. Surgical clips in the abdomen and pelvis. Dilated loops of small bowel noted. These have slightly improved. Colon is nondistended. Oral contrast is present in the colon. No free air. Vascular calcification. Left lower lobe atelectasis and/or infiltrate with small left pleural effusion. Cardiomegaly. Diffuse osteopenia. Degenerative changes lumbar spine and both hips.  IMPRESSION: 1. Dilated loops of small bowel noted. Small  bowel obstruction cannot be excluded . Slight interim improvement. 2. Bilateral double-J ureteral stents in good anatomic position. 3. Aortoiliac stent graft noted. 4. Left lower lobe atelectasis and/or infiltrate with small left pleural effusion. 5. Cardiomegaly.   Electronically Signed   By: Marcello Moores  Register   On: 01/24/2015 08:27   Dg Abd 1 View  01/23/2015   CLINICAL DATA:  Abdominal pain, nausea, abdominal distension, ileus  EXAM: ABDOMEN - 1 VIEW  COMPARISON:  01/22/2015  FINDINGS: Pelvic embolization coils.  Aortoiliac stenting.  BILATERAL ureteral stents.  Persistent dilatation of small bowel loops question ileus versus small bowel obstruction, with slightly less small bowel gas and on the previous study.  No definite bowel wall thickening.  Bones diffusely demineralized.  IMPRESSION: Slightly decreased small bowel gaseous distention since previous exam, question ileus versus small bowel obstruction.   Electronically Signed   By: Lavonia Dana M.D.   On: 01/23/2015 14:11    Lab Results  Component Value Date   WBC 11.5* 01/24/2015   HGB 7.3* 01/24/2015   HCT 22.4* 01/24/2015   MCV 89.7 01/24/2015   PLT 271 01/24/2015   Assessment/Plan: 74 year old  gentleman with multiple medical problems, on dialysis, chronic obstructive nephropathy, as well as bilateral urethral stents in place. He has recurrent history of multiple urinary tract infections in the past with Enterobacter. He also had viridans strep bacteremia in October. He was admiited with urosepsis with quite purulent urine in his Foley catheter. He clinically  improved on  meropenem and vancomycin. Off pressors.  Ucx is negative unfortunately  RECOMMENDATIONS: Stop vanco Can deescalate to cipro in place of mero 14 day course recommended Agree with urological evaluation. - will likely need stent removal given current infection    Leonel Ramsay   01/24/2015, 3:12 PM

## 2015-01-24 NOTE — Care Management Note (Signed)
Case Management Note  Patient Details  Name: [name] MRN: [id number] Date of Birth: March 08, 1941  Subjective/Objective:     Iran Sizer  with [facility] made aware yesterday of dialysis.              Action/Plan:   Expected Discharge Date:                  Expected Discharge Plan:  [place]  In-House Referral:     Discharge planning Services  CM Consult  Post Acute Care Choice:    Choice offered to:     DME Arranged:    DME Agency:     HH Arranged:    HH Agency:     Status of Service:  In process, will continue to follow  Medicare Important Message Given:    Date Medicare IM Given:    Medicare IM give by:    Date Additional Medicare IM Given:    Additional Medicare Important Message give by:     If discussed at [place] of Stay Meetings, dates discussed:    Additional Comments:  Jolly Mango, RN 01/24/2015, 8:37 AM

## 2015-01-24 NOTE — Plan of Care (Signed)
[name] with Dr. Anselm Jungling on the phone, discussed plan of care, and that the patient was in A. Fib. after arriving up from dialysis.  New orders received regaurding keeping NG tube in place and heart medication.

## 2015-01-24 NOTE — Progress Notes (Signed)
PT Cancellation Note  Patient Details Name: [name] MRN: [id number] DOB: 05-01-1941   Cancelled Treatment:    Reason Eval/Treat Not Completed: Medical issues which prohibited therapy;Patient at procedure or test/unavailable.  HD then too exhausted and weak to do therapy.   Ramond Dial 01/24/2015, 5:32 PM   Mee Hives, PT MS Acute Rehab Dept. Number: [phone]

## 2015-01-24 NOTE — Progress Notes (Signed)
Nutrition Follow-up  DOCUMENTATION CODES:  INTERVENTION: Meals and Snacks: Cater to patient preferences as able   NUTRITION DIAGNOSIS:  Inadequate oral intake related to acute illness as evidenced by NPO status.  **Pt just resumed on CL diet after 4 days NPO r/t ileus  GOAL: Energy Intake: goal for diet advancement as medically appropriate within 48-72 hours   MONITOR:  PO intake, Diet advancement, Labs  REASON FOR ASSESSMENT:  Other (Comment) (RD Follow Up)    ASSESSMENT:  Clinical Update: Pt made NPO on 4/30; + ileus after multiple episodes of vomiting. Ileus improving per MD note. Currently advanced to CL diet this afternoon.  NGT Output: 1150 ml x 24 hrs- NGT now removed Typical Food/ Fluid Intake: started on CL today  Weight Changes: None noted Labs: Electrolyte and Renal Profile:    Recent Labs Lab 01/20/15 0430 01/22/15 0435 01/23/15 1445 01/24/15 0940  BUN 32* 38* 56*  --   CREATININE 6.73* 8.36* 9.87*  --   NA 141 142 143  --   K 3.6 3.9 4.1  --   MG  --   --   --  2.2  PHOS  --  5.3* 7.0*  --    Protein Profile:   Recent Labs Lab 01/23/15 1445  ALBUMIN 2.1*    Meds:NS @ 10/ hr, Lipitor   Height:  Ht Readings from Last 1 Encounters:  01/20/15 5' 6.97" (1.701 m)    Weight:  Wt Readings from Last 1 Encounters:  01/24/15 215 lb 9.8 oz (97.8 kg)    Ideal Body Weight:     Wt Readings from Last 10 Encounters:  01/24/15 215 lb 9.8 oz (97.8 kg)    BMI:  Body mass index is 33.8 kg/(m^2).  Estimated Nutritional Needs:  Kcal:  2549-8264 kcal/ day (BEE: 1373 x 1.2 AF x 1.0-1.2 IF)- using IBW of 67 kg  Protein:  80-101 g Pro/day (1.2-1.5 g Pro/ kg/ day)- using IBW of 67 kg  Fluid:  1047ml + UOP  Skin:  Reviewed, no issues  Diet Order:  Diet clear liquid Room service appropriate?: Yes; Fluid consistency:: Thin  EDUCATION NEEDS:  No education needs identified at this time   Intake/Output Summary (Last 24 hours) at 01/24/15  1545 Last data filed at 01/24/15 1350  Gross per 24 hour  Intake 313.52 ml  Output   4275 ml  Net -3961.48 ml    Last BM:  Unknown  Roda Shutters, RDN [phone]  MODERATE Care Level

## 2015-01-24 NOTE — Progress Notes (Signed)
Rehab admissions - I am following pt's case and have reviewed pt's progress. At this time, we are recommending that skilled nursing be pursued. At this time, pt is not demonstrating adequate activity tolerance for our rehab program. In addition, it is questionable if [facility] Medicare would give authorization for inpatient rehab based on his current functional level and activity tolerance.  I called and updated Lattie Haw, case Freight forwarder and Lattie Haw stated they are planning for long term care/SNF.  I will now sign off pt's case.   Nanetta Batty, PT Rehabilitation Admissions Coordinator [phone]

## 2015-01-24 NOTE — Progress Notes (Signed)
[facility]  ROUNDING NOTE   Subjective:   NGT now off suction Patient seen and examined on hemodialysis.  Patient under his dry weight. No UF.   Objective:  Vital signs in last 24 hours:  Temp:  [97.6 F (36.4 C)-98.5 F (36.9 C)] 97.9 F (36.6 C) (05/03 0940) Pulse Rate:  [75-84] 84 (05/03 1130) Resp:  [17-24] 24 (05/03 1130) BP: (105-120)/(55-73) 120/73 mmHg (05/03 0940) SpO2:  [100 %] 100 % (05/03 1130) Weight:  [97 kg (213 lb 13.5 oz)] 97 kg (213 lb 13.5 oz) (05/03 0926)  Weight change:  Filed Weights   01/20/15 1300 01/22/15 0500 01/24/15 0926  Weight: 101.8 kg (224 lb 6.9 oz) 101 kg (222 lb 10.6 oz) 97 kg (213 lb 13.5 oz)    Intake/Output: I/O last 3 completed shifts: In: 597.3 [I.V.:389.3; NG/GT:160; IV Piggyback:48] Out: 2900 [Urine:1000; Emesis/NG output:1900]   Intake/Output this shift:  Total I/O In: 0  Out: 3150 [Urine:150; Other:3000]  General: not in acute distress Head: +OGT to suction Neck: supple CVS: regular rate and rhythm Resp: clear  ABD: +distended EXT: no edema Neuro: alert and oriented Access: Left arm AVF   Basic Metabolic Panel:  Recent Labs Lab 01/18/15 1707 01/19/15 0626 01/20/15 0430 01/22/15 0435 01/23/15 1445 01/24/15 0940  NA 140 138 141 142 143  --   K 3.3* 3.5 3.6 3.9 4.1  --   CL 96* 99* 99* 98* 101  --   CO2 27 27 30 31 24   --   GLUCOSE 213* 133* 119* 94 76  --   BUN 37* 41* 32* 38* 56*  --   CREATININE 7.94* 8.24* 6.73* 8.36* 9.87*  --   CALCIUM 8.1* 7.9* 7.8* 7.9* 8.0*  --   MG  --   --   --   --   --  2.2  PHOS  --   --   --  5.3* 7.0*  --     Liver Function Tests:  Recent Labs Lab 01/23/15 1445  ALBUMIN 2.1*   No results for input(s): LIPASE, AMYLASE in the last 168 hours. No results for input(s): AMMONIA in the last 168 hours.  CBC:  Recent Labs Lab 01/19/15 0626 01/20/15 0430 01/22/15 0435 01/23/15 1445 01/24/15 0527  WBC 14.1* 11.7* 12.9* 13.6* 11.5*  NEUTROABS 10.4*  9.5*  --   --   --   HGB 7.3* 6.9* 7.0* 7.3* 7.3*  HCT 22.3* 21.5* 22.0* 23.4* 22.4*  MCV 89 89 89.0 90.3 89.7  PLT 208 176 220 279 271    Cardiac Enzymes: No results for input(s): CKTOTAL, CKMB, CKMBINDEX, TROPONINI in the last 168 hours.  BNP: Invalid input(s): POCBNP  CBG: No results for input(s): GLUCAP in the last 168 hours.  Microbiology: Results for orders placed or performed during the hospital encounter of 01/18/15  Culture, blood (single)     Status: None (Preliminary result)   Collection Time: 01/18/15  7:11 PM  Result Value Ref Range Status   Micro Text Report   Preliminary       COMMENT                   NO GROWTH IN 48 HOURS   ANTIBIOTIC  Culture, blood (single)     Status: None (Preliminary result)   Collection Time: 01/18/15  7:11 PM  Result Value Ref Range Status   Micro Text Report   Preliminary       COMMENT                   NO GROWTH IN 48 HOURS   ANTIBIOTIC                                                      Urine culture     Status: None   Collection Time: 01/18/15  7:36 PM  Result Value Ref Range Status   Micro Text Report   Final       SOURCE: INDWELLING CATHETER    COMMENT                   NO GROWTH IN 36 HOURS   ANTIBIOTIC                                                      Urine culture     Status: None (Preliminary result)   Collection Time: 01/20/15  4:09 PM  Result Value Ref Range Status   Micro Text Report   Preliminary       SOURCE: INDWELLING CATH    ORGANISM 1                2000 CFU/ML GRAM NEGATIVE ROD   COMMENT                   ID TO FOLLOW SENSITIVITIES TO FOLLOW   COMMENT                   REPEATING ID AND SENSITIVIES   ANTIBIOTIC                                                        Coagulation Studies: No results for input(s): LABPROT, INR in the last 72 hours.  Urinalysis: No results for input(s): COLORURINE, LABSPEC, PHURINE, GLUCOSEU, HGBUR,  BILIRUBINUR, KETONESUR, PROTEINUR, UROBILINOGEN, NITRITE, LEUKOCYTESUR in the last 72 hours.  Invalid input(s): APPERANCEUR    Imaging: Dg Abd 1 View  01/24/2015   CLINICAL DATA:  Ileus.  EXAM: ABDOMEN - 1 VIEW  COMPARISON:  01/23/2015.  01/22/2015.  FINDINGS: Bilateral double-J ureteral stents noted. Prior aortoiliac stent graft placement with bilateral hypogastric coils. Surgical clips in the abdomen and pelvis. Dilated loops of small bowel noted. These have slightly improved. Colon is nondistended. Oral contrast is present in the colon. No free air. Vascular calcification. Left lower lobe atelectasis and/or infiltrate with small left pleural effusion. Cardiomegaly. Diffuse osteopenia. Degenerative changes lumbar spine and both hips.  IMPRESSION: 1. Dilated loops of small bowel noted. Small bowel obstruction cannot be excluded . Slight interim improvement. 2. Bilateral double-J ureteral stents in good anatomic position. 3. Aortoiliac stent graft noted. 4. Left lower lobe atelectasis and/or infiltrate with small left pleural effusion. 5.  Cardiomegaly.   Electronically Signed   By: Marcello Moores  Register   On: 01/24/2015 08:27   Dg Abd 1 View  01/23/2015   CLINICAL DATA:  Abdominal pain, nausea, abdominal distension, ileus  EXAM: ABDOMEN - 1 VIEW  COMPARISON:  01/22/2015  FINDINGS: Pelvic embolization coils.  Aortoiliac stenting.  BILATERAL ureteral stents.  Persistent dilatation of small bowel loops question ileus versus small bowel obstruction, with slightly less small bowel gas and on the previous study.  No definite bowel wall thickening.  Bones diffusely demineralized.  IMPRESSION: Slightly decreased small bowel gaseous distention since previous exam, question ileus versus small bowel obstruction.   Electronically Signed   By: Lavonia Dana M.D.   On: 01/23/2015 14:11     Medications:   . sodium chloride     . [START ON 01/27/2015] amiodarone  200 mg Oral BID  . [START ON 02/03/2015] amiodarone  200 mg  Oral Daily  . amiodarone  400 mg Oral BID  . atorvastatin  40 mg Oral Daily  . meropenem (MERREM) IV  500 mg Intravenous Q24H  . sodium chloride  5 mL Intravenous Q12H  . vancomycin  1,000 mg Intravenous Q T,Th,Sa-HD   sodium chloride, sodium chloride, acetaminophen, ondansetron (ZOFRAN) IV, phenol, sodium chloride, sodium chloride  Assessment/ Plan:  74 y.o. 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 Currently below EDW due to GI losses. Tolerating treatment well.   2. Anemia of chronic kidney disease: D63.1 hgb 7.3 - holding epo due to prostate cancer.   3. Secondary Hyperparathyroidism: N25.81 phos at goal. Currently NPO and not on binders.   4. Ileus: K56.7 NGT to suction. Continue supportive care.   5. A. Fib with RVR: I48.91 converted to sinus rhythm, on amiodarone.    LOS: 6 [name] 5/3/201611:59 AM

## 2015-01-24 NOTE — Clinical Social Work Note (Signed)
Clinical Social Work Assessment  Patient Details  Name: [name] MRN: [id number] Date of Birth: 04-13-1941  Date of referral:  01/24/15               Reason for consult:  Facility Placement                Permission sought to share information with:  Family Supports Permission granted to share information::  Yes, Verbal Permission Granted  Name::     [name]: [phone]  Agency::  SNF agencies  Relationship::     Contact Information:     Housing/Transportation Living arrangements for the past 2 months:   (home) Source of Information:  Patient, Other (Comment Required) (Cousin: [name]) Patient Interpreter Needed:  None Criminal Activity/Legal Involvement Pertinent to Current Situation/Hospitalization:  No - Comment as needed Significant Relationships:   (Cousin) Lives with:  Self Do you feel safe going back to the place where you live?  Yes Need for family participation in patient care:  Yes (Comment)  Care giving concerns:  Patient lives alone   Facilities manager / plan:  CSW spoke with patient and his cousin this afternoon regarding physical therapy recommendations. Patient aware that CSW attempted to call his daughter but was unable to leave a message because her voice mail had not been set up. Patient pointed to his cousin: [name] sitting next to him that he wants to assist in making decisions. [name] was able to clarify that patient had been sent to [facility] back in October of 2015 and then was discharged where he returned home alone. Patient and [name] are in agreement with short term rehab and prefer [facility] this time due to location.   Employment status:  Unemployed Nurse, adult PT Recommendations:  [place] / Referral to community resources:  [place]  Patient/Family's Response to care:  Agreeable  Patient/Family's Understanding of and Emotional Response to Diagnosis, Current  Treatment, and Prognosis:  Patient states that he is ready to get his tube out and discharge.   Emotional Assessment Appearance:  Appears stated age Attitude/Demeanor/Rapport:  Other (cooperative, talkative) Affect (typically observed):  Accepting Orientation:  Oriented to Self, Oriented to Place, Oriented to  Time, Oriented to Situation Alcohol / Substance use:  Not Applicable Psych involvement (Current and /or in the community):  No (Comment)  Discharge Needs  Concerns to be addressed:  Discharge Planning Concerns Readmission within the last 30 days:  No Current discharge risk:  None Barriers to Discharge:  No Barriers Identified   Kirkland Hun 01/24/2015, 3:11 PM

## 2015-01-25 ENCOUNTER — Encounter: Payer: Self-pay | Admitting: Internal Medicine

## 2015-01-25 ENCOUNTER — Inpatient Hospital Stay: Payer: Medicare Other

## 2015-01-25 LAB — RENAL FUNCTION PANEL
ALBUMIN: 2.3 g/dL — AB (ref 3.5–5.0)
ANION GAP: 15 (ref 5–15)
BUN: 34 mg/dL — ABNORMAL HIGH (ref 6–20)
CALCIUM: 7.7 mg/dL — AB (ref 8.9–10.3)
CO2: 27 mmol/L (ref 22–32)
CREATININE: 7.06 mg/dL — AB (ref 0.61–1.24)
Chloride: 100 mmol/L — ABNORMAL LOW (ref 101–111)
GFR calc Af Amer: 8 mL/min — ABNORMAL LOW (ref >60)
GFR calc non Af Amer: 7 mL/min — ABNORMAL LOW (ref >60)
GLUCOSE: 86 mg/dL (ref 65–99)
PHOSPHORUS: 5.4 mg/dL — AB (ref 2.5–4.6)
Potassium: 3.7 mmol/L (ref 3.5–5.1)
Sodium: 142 mmol/L (ref 135–145)

## 2015-01-25 LAB — CBC
HEMATOCRIT: 23.6 % — AB (ref 40.0–52.0)
Hemoglobin: 7.4 g/dL — ABNORMAL LOW (ref 13.0–18.0)
MCH: 28.2 pg (ref 26.0–34.0)
MCHC: 31.6 g/dL — AB (ref 32.0–36.0)
MCV: 89.2 fL (ref 80.0–100.0)
Platelets: 302 10*3/uL (ref 150–440)
RBC: 2.65 MIL/uL — ABNORMAL LOW (ref 4.40–5.90)
RDW: 19 % — AB (ref 11.5–14.5)
WBC: 9.5 10*3/uL (ref 3.8–10.6)

## 2015-01-25 LAB — URINE CULTURE

## 2015-01-25 MED ORDER — METOCLOPRAMIDE HCL 5 MG/ML IJ SOLN
10.0000 mg | Freq: Three times a day (TID) | INTRAMUSCULAR | Status: AC
  Administered 2015-01-25 – 2015-01-26 (×4): 10 mg via INTRAVENOUS
  Filled 2015-01-25 (×4): qty 2

## 2015-01-25 MED ORDER — SODIUM CHLORIDE 0.9 % IJ SOLN
10.0000 mL | Freq: Two times a day (BID) | INTRAMUSCULAR | Status: DC
Start: 2015-01-25 — End: 2015-01-25
  Administered 2015-01-25: 10 mL via INTRAVENOUS

## 2015-01-25 MED ORDER — SODIUM CHLORIDE 0.9 % IJ SOLN
10.0000 mL | INTRAMUSCULAR | Status: DC | PRN
Start: 2015-01-25 — End: 2015-02-04
  Administered 2015-01-26 – 2015-02-01 (×3): 10 mL
  Filled 2015-01-25 (×3): qty 40

## 2015-01-25 MED ORDER — SODIUM CHLORIDE 0.9 % IJ SOLN
10.0000 mL | Freq: Two times a day (BID) | INTRAMUSCULAR | Status: DC
  Administered 2015-01-25 – 2015-01-30 (×7): 10 mL
  Administered 2015-01-31: 20 mL
  Administered 2015-01-31 – 2015-02-03 (×6): 10 mL
  Administered 2015-02-04: 30 mL

## 2015-01-25 MED ORDER — SODIUM CHLORIDE 0.9 % IV SOLN: 100.0000 mL | INTRAVENOUS | Status: DC | PRN

## 2015-01-25 MED ORDER — EPOETIN ALFA 10000 UNIT/ML IJ SOLN
10000.0000 [IU] | Freq: Once | INTRAMUSCULAR | Status: DC
  Filled 2015-01-25: qty 1

## 2015-01-25 MED ORDER — SODIUM CHLORIDE 0.9 % IJ SOLN: 10.0000 mL | INTRAMUSCULAR | Status: DC | PRN

## 2015-01-25 NOTE — Progress Notes (Signed)
Physical Therapy Treatment Patient Details Name: [name] MRN: [id number] DOB: 21-Nov-1940 Today's Date: 01/25/2015    History of Present Illness 74 yo male with onset of septic shock and respriatory distress, hypotension and atelectasis, recent bladder history, PMHx:  a-fib with RVR, prostate CA, ESRD T TH S, neurogenic bladder, AAA, anemia,     PT Comments    Pt is up in chair when PT arrives and is fatigued.  He was assisted with 2 nursing staff members and PT to bed with first a partial stand to put pad under him then to slide overin partial standing.  He is noted to struggle with SOB after each effort, has to catch up but demonstrates a good tolerance overall for the exertion to get on his feet.  Follow Up Recommendations  CIR     Equipment Recommendations  None recommended by PT    Recommendations for Other Services Rehab consult     Precautions / Restrictions Precautions Precautions: Fall Restrictions Weight Bearing Restrictions: No    Mobility  Bed Mobility Overal bed mobility: Needs Assistance Bed Mobility: Sit to Supine       Sit to supine: Mod assist;+2 for physical assistance;+2 for safety/equipment   General bed mobility comments: assisting his legs and lean of trunk due to general weakness  Transfers Overall transfer level: Needs assistance Equipment used: 1 person hand held assist (pad under hips) Transfers: Lateral/Scoot Transfers Sit to Stand: Max assist;+2 physical assistance;+2 safety/equipment        Lateral/Scoot Transfers: Max assist;+2 physical assistance;+2 safety/equipment General transfer comment: Tolerating very partial standing with sliding transfer due to fatigue  Ambulation/Gait             General Gait Details: unable   Stairs            Wheelchair Mobility    Modified Rankin (Stroke Patients Only)       Balance Overall balance assessment: Needs assistance Sitting-balance support: Feet  supported;Bilateral upper extremity supported Sitting balance-Leahy Scale: Fair Sitting balance - Comments: reminders to control his midline awareness and to give him cues for hnd placement Postural control: Posterior lean Standing balance support: Bilateral upper extremity supported Standing balance-Leahy Scale: Poor                      Cognition Arousal/Alertness: Awake/alert Behavior During Therapy: WFL for tasks assessed/performed Overall Cognitive Status: Within Functional Limits for tasks assessed                      Exercises      General Comments General comments (skin integrity, edema, etc.): Has a better lab profile but continues to have low Ca and very elevated creatinine.  Very exhaused by the HD yesterday and will anticipate the need to maybe prioritize tx lower on HD days.      Pertinent Vitals/Pain Pain Assessment: No/denies pain    Home Living                      Prior Function            PT Goals (current goals can now be found in the care plan section) Acute Rehab PT Goals Patient Stated Goal: Walk again Progress towards PT goals: Progressing toward goals    Frequency  Min 2X/week    PT Plan Current plan remains appropriate    Co-evaluation             End  of Session Equipment Utilized During Treatment: Oxygen Activity Tolerance: Patient tolerated treatment well;Patient limited by fatigue Patient left: in bed;with call bell/phone within reach;with bed alarm set     Time: 1435-1500 PT Time Calculation (min) (ACUTE ONLY): 25 min  Charges:  $Therapeutic Activity: 23-37 mins                    G Codes:      Ramond Dial Feb 03, 2015, 3:35 PM   Mee Hives, PT MS Acute Rehab Dept. Number: [phone]

## 2015-01-25 NOTE — Consult Note (Signed)
Subjective: Consulted for ongoing ileus:  Vital signs in last 24 hours: Temp:  [97.7 F (36.5 C)-98.6 F (37 C)] 98.1 F (36.7 C) (05/04 0856) Pulse Rate:  [79-87] 87 (05/04 0857) Resp:  [18-24] 18 (05/04 0856) BP: (92-109)/(55-75) 109/60 mmHg (05/04 0856) SpO2:  [93 %-98 %] 94 % (05/04 0857) Weight:  [97.8 kg (215 lb 9.8 oz)] 97.8 kg (215 lb 9.8 oz) (05/03 1313)    Intake/Output from previous day: 05/03 0701 - 05/04 0700 In: 490.9 [I.V.:219.9; NG/GT:80; IV Piggyback:191] Out: 4750 [Urine:1100; Emesis/NG output:650]  PERTINENT EXAM: abdominal exam benign  Lab Results:  CBC  Recent Labs  01/24/15 0527 01/25/15 1246  WBC 11.5* 9.5  HGB 7.3* 7.4*  HCT 22.4* 23.6*  PLT 271 302   CMP     Component Value Date/Time   NA 143 01/23/2015 1445   NA 141 01/20/2015 0430   K 4.1 01/23/2015 1445   K 3.6 01/20/2015 0430   CL 101 01/23/2015 1445   CL 99* 01/20/2015 0430   CO2 24 01/23/2015 1445   CO2 30 01/20/2015 0430   GLUCOSE 76 01/23/2015 1445   GLUCOSE 119* 01/20/2015 0430   BUN 56* 01/23/2015 1445   BUN 32* 01/20/2015 0430   CREATININE 9.87* 01/23/2015 1445   CREATININE 6.73* 01/20/2015 0430   CALCIUM 8.0* 01/23/2015 1445   CALCIUM 7.8* 01/20/2015 0430   PROT 6.5 07/04/2014 0435   ALBUMIN 2.1* 01/23/2015 1445   ALBUMIN 2.4* 07/06/2014 0510   AST 19 07/04/2014 0435   ALT 10* 07/04/2014 0435   ALKPHOS 139* 07/04/2014 0435   GFRNONAA 5* 01/23/2015 1445   GFRNONAA 7* 01/20/2015 0430   GFRAA 5* 01/23/2015 1445   GFRAA 9* 01/20/2015 0430   PT/INR No results for input(s): LABPROT, INR in the last 72 hours.  Studies/Results: Dg Abd 1 View  01/25/2015   CLINICAL DATA:  Abdominal pain, improving symptoms  EXAM: ABDOMEN - 1 VIEW  COMPARISON:  Supine abdominal film dated Jan 24, 2015  FINDINGS: There has been interval placement of a nasogastric tube. The proximal port lies at or just below the level of the GE junction. There remain loops of mildly distended gas-filled  small bowel. The caliber of the colon is normal. There small amount of rectal gas. Bilateral double-J ureteral stents are unchanged in position. There is an aorto bi-iliac stent graft in place. There are metallic coils in the pelvis likely from previous embolization procedures.  IMPRESSION: There has been slight interval increase in the volume of small bowel gas consistent with a mid to distal small bowel obstruction. Advancement of the nasogastric tube by 10 cm is recommended to assure that the proximal port lies below the GE junction.   Electronically Signed   By: David  Martinique M.D.   On: 01/25/2015 07:53   Dg Abd 1 View  01/24/2015   CLINICAL DATA:  Ileus.  EXAM: ABDOMEN - 1 VIEW  COMPARISON:  01/23/2015.  01/22/2015.  FINDINGS: Bilateral double-J ureteral stents noted. Prior aortoiliac stent graft placement with bilateral hypogastric coils. Surgical clips in the abdomen and pelvis. Dilated loops of small bowel noted. These have slightly improved. Colon is nondistended. Oral contrast is present in the colon. No free air. Vascular calcification. Left lower lobe atelectasis and/or infiltrate with small left pleural effusion. Cardiomegaly. Diffuse osteopenia. Degenerative changes lumbar spine and both hips.  IMPRESSION: 1. Dilated loops of small bowel noted. Small bowel obstruction cannot be excluded . Slight interim improvement. 2. Bilateral double-J ureteral stents in  good anatomic position. 3. Aortoiliac stent graft noted. 4. Left lower lobe atelectasis and/or infiltrate with small left pleural effusion. 5. Cardiomegaly.   Electronically Signed   By: Marcello Moores  Register   On: 01/24/2015 08:27   Dg Abd 1 View  01/23/2015   CLINICAL DATA:  Abdominal pain, nausea, abdominal distension, ileus  EXAM: ABDOMEN - 1 VIEW  COMPARISON:  01/22/2015  FINDINGS: Pelvic embolization coils.  Aortoiliac stenting.  BILATERAL ureteral stents.  Persistent dilatation of small bowel loops question ileus versus small bowel obstruction,  with slightly less small bowel gas and on the previous study.  No definite bowel wall thickening.  Bones diffusely demineralized.  IMPRESSION: Slightly decreased small bowel gaseous distention since previous exam, question ileus versus small bowel obstruction.   Electronically Signed   By: Lavonia Dana M.D.   On: 01/23/2015 14:11    Assessment/Plan: A - XRays show solid stool in colon (even the right colon, which should be liquid, but also the entire left colon), c/w an adynamic ileus, likely due to his PNA. Nl WBC, no objective signs of a mechanical SBO P - Advance NG (I discussed with RN) Con't NG suction, NPO (x limited ice chips)

## 2015-01-25 NOTE — Progress Notes (Signed)
[facility]  ROUNDING NOTE   Subjective:   NGT to suction. AXR with increasing bowel loops. Currently without diet.  Hemodialysis yesterday. Tolerated treatment well. UF 3 litres.   Objective:  Vital signs in last 24 hours:  Temp:  [97.7 F (36.5 C)-98.6 F (37 C)] 98.1 F (36.7 C) (05/04 0856) Pulse Rate:  [77-89] 87 (05/04 0857) Resp:  [17-25] 18 (05/04 0856) BP: (92-109)/(55-75) 109/60 mmHg (05/04 0856) SpO2:  [93 %-100 %] 94 % (05/04 0857) Weight:  [97.8 kg (215 lb 9.8 oz)] 97.8 kg (215 lb 9.8 oz) (05/03 1313)  Weight change:  Filed Weights   01/22/15 0500 01/24/15 0926 01/24/15 1313  Weight: 101 kg (222 lb 10.6 oz) 97 kg (213 lb 13.5 oz) 97.8 kg (215 lb 9.8 oz)    Intake/Output: I/O last 3 completed shifts: In: 702.9 [I.V.:323.9; NG/GT:140; IV Piggyback:239] Out: 1287 [Urine:1325; Emesis/NG output:900; Other:3000]   Intake/Output this shift:  Total I/O In: 0  Out: 150 [Urine:150]  General: not in acute distress Head: +NGT to suction Neck: supple CVS: regular rate and rhythm Resp: clear  ABD: +distended EXT: no edema Neuro: alert and oriented Access: Left arm AVF   Basic Metabolic Panel:  Recent Labs Lab 01/18/15 1707 01/19/15 0626 01/20/15 0430 01/22/15 0435 01/23/15 1445 01/24/15 0940  NA 140 138 141 142 143  --   K 3.3* 3.5 3.6 3.9 4.1  --   CL 96* 99* 99* 98* 101  --   CO2 27 27 30 31 24   --   GLUCOSE 213* 133* 119* 94 76  --   BUN 37* 41* 32* 38* 56*  --   CREATININE 7.94* 8.24* 6.73* 8.36* 9.87*  --   CALCIUM 8.1* 7.9* 7.8* 7.9* 8.0*  --   MG  --   --   --   --   --  2.2  PHOS  --   --   --  5.3* 7.0*  --     Liver Function Tests:  Recent Labs Lab 01/23/15 1445  ALBUMIN 2.1*   No results for input(s): LIPASE, AMYLASE in the last 168 hours. No results for input(s): AMMONIA in the last 168 hours.  CBC:  Recent Labs Lab 01/19/15 0626 01/20/15 0430 01/22/15 0435 01/23/15 1445 01/24/15 0527  WBC 14.1* 11.7*  12.9* 13.6* 11.5*  NEUTROABS 10.4* 9.5*  --   --   --   HGB 7.3* 6.9* 7.0* 7.3* 7.3*  HCT 22.3* 21.5* 22.0* 23.4* 22.4*  MCV 89 89 89.0 90.3 89.7  PLT 208 176 220 279 271    Cardiac Enzymes: No results for input(s): CKTOTAL, CKMB, CKMBINDEX, TROPONINI in the last 168 hours.  BNP: Invalid input(s): POCBNP  CBG: No results for input(s): GLUCAP in the last 168 hours.  Microbiology: Results for orders placed or performed during the hospital encounter of 01/18/15  Culture, blood (single)     Status: None (Preliminary result)   Collection Time: 01/18/15  7:11 PM  Result Value Ref Range Status   Micro Text Report   Preliminary       COMMENT                   NO GROWTH IN 48 HOURS   ANTIBIOTIC  Culture, blood (single)     Status: None (Preliminary result)   Collection Time: 01/18/15  7:11 PM  Result Value Ref Range Status   Micro Text Report   Preliminary       COMMENT                   NO GROWTH IN 48 HOURS   ANTIBIOTIC                                                      Urine culture     Status: None   Collection Time: 01/18/15  7:36 PM  Result Value Ref Range Status   Micro Text Report   Final       SOURCE: INDWELLING CATHETER    COMMENT                   NO GROWTH IN 36 HOURS   ANTIBIOTIC                                                      Urine culture     Status: None (Preliminary result)   Collection Time: 01/20/15  4:09 PM  Result Value Ref Range Status   Micro Text Report   Preliminary       SOURCE: INDWELLING CATH    ORGANISM 1                2000 CFU/ML GRAM NEGATIVE ROD   COMMENT                   ID TO FOLLOW SENSITIVITIES TO FOLLOW   COMMENT                   REPEATING ID AND SENSITIVIES   ANTIBIOTIC                                                        Coagulation Studies: No results for input(s): LABPROT, INR in the last 72 hours.  Urinalysis: No results for input(s): COLORURINE,  LABSPEC, PHURINE, GLUCOSEU, HGBUR, BILIRUBINUR, KETONESUR, PROTEINUR, UROBILINOGEN, NITRITE, LEUKOCYTESUR in the last 72 hours.  Invalid input(s): APPERANCEUR    Imaging: Dg Abd 1 View  01/25/2015   CLINICAL DATA:  Abdominal pain, improving symptoms  EXAM: ABDOMEN - 1 VIEW  COMPARISON:  Supine abdominal film dated Jan 24, 2015  FINDINGS: There has been interval placement of a nasogastric tube. The proximal port lies at or just below the level of the GE junction. There remain loops of mildly distended gas-filled small bowel. The caliber of the colon is normal. There small amount of rectal gas. Bilateral double-J ureteral stents are unchanged in position. There is an aorto bi-iliac stent graft in place. There are metallic coils in the pelvis likely from previous embolization procedures.  IMPRESSION: There has been slight interval increase in the volume of small bowel gas consistent with a mid to distal small bowel obstruction. Advancement of the nasogastric tube by 10 cm  is recommended to assure that the proximal port lies below the GE junction.   Electronically Signed   By: David  Martinique M.D.   On: 01/25/2015 07:53   Dg Abd 1 View  01/24/2015   CLINICAL DATA:  Ileus.  EXAM: ABDOMEN - 1 VIEW  COMPARISON:  01/23/2015.  01/22/2015.  FINDINGS: Bilateral double-J ureteral stents noted. Prior aortoiliac stent graft placement with bilateral hypogastric coils. Surgical clips in the abdomen and pelvis. Dilated loops of small bowel noted. These have slightly improved. Colon is nondistended. Oral contrast is present in the colon. No free air. Vascular calcification. Left lower lobe atelectasis and/or infiltrate with small left pleural effusion. Cardiomegaly. Diffuse osteopenia. Degenerative changes lumbar spine and both hips.  IMPRESSION: 1. Dilated loops of small bowel noted. Small bowel obstruction cannot be excluded . Slight interim improvement. 2. Bilateral double-J ureteral stents in good anatomic position. 3.  Aortoiliac stent graft noted. 4. Left lower lobe atelectasis and/or infiltrate with small left pleural effusion. 5. Cardiomegaly.   Electronically Signed   By: Marcello Moores  Register   On: 01/24/2015 08:27   Dg Abd 1 View  01/23/2015   CLINICAL DATA:  Abdominal pain, nausea, abdominal distension, ileus  EXAM: ABDOMEN - 1 VIEW  COMPARISON:  01/22/2015  FINDINGS: Pelvic embolization coils.  Aortoiliac stenting.  BILATERAL ureteral stents.  Persistent dilatation of small bowel loops question ileus versus small bowel obstruction, with slightly less small bowel gas and on the previous study.  No definite bowel wall thickening.  Bones diffusely demineralized.  IMPRESSION: Slightly decreased small bowel gaseous distention since previous exam, question ileus versus small bowel obstruction.   Electronically Signed   By: Lavonia Dana M.D.   On: 01/23/2015 14:11     Medications:   . sodium chloride     . [START ON 01/27/2015] amiodarone  200 mg Oral BID  . [START ON 02/03/2015] amiodarone  200 mg Oral Daily  . amiodarone  400 mg Oral BID  . atorvastatin  40 mg Oral Daily  . ciprofloxacin  400 mg Intravenous Q24H  . diltiazem  60 mg Oral 3 times per day  . sodium chloride  5 mL Intravenous Q12H   acetaminophen, ondansetron (ZOFRAN) IV, phenol, sodium chloride, sodium chloride  Assessment/ Plan:  74 y.o. 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 Plan for next treatment yesterday.  - check renal function panel.   2. Anemia of chronic kidney disease: D63.1 hgb 7.3 - holding epo due to prostate cancer.   3. Secondary Hyperparathyroidism: N25.81 phos at goal. Currently NPO and not on binders. On sevelamer as outpatient.   4. Ileus: K56.7 NGT to suction. Continue supportive care.   5. A. Fib with RVR: I48.91 converted to sinus rhythm, on amiodarone.  6. Hypertension: well controlled. Currently holding amlodipine.     LOS: 7 Weslynn Ke 5/4/201610:19 AM

## 2015-01-25 NOTE — Clinical Social Work Note (Signed)
Patient has received bed offer from facility of choice: [facility]. Bed offer accepted. CSW attempted to reach patient's cousin, [name] but there was no answer.  [name] MSW,LCSWA [phone]

## 2015-01-25 NOTE — Progress Notes (Signed)
MEDICATION RELATED CONSULT NOTE - FOLLOW UP   Pharmacy Consult for Renal dosing in HD Patient Indication: Medication adjustments in HD patient  Allergies  Allergen Reactions  . Penicillins Hives    Patient Measurements: Height: 5' 6.97" (170.1 cm) Weight: 215 lb 9.8 oz (97.8 kg) IBW/kg (Calculated) : 66.03   Labs:  Recent Labs  01/23/15 1445 01/24/15 0527 01/24/15 0940  WBC 13.6* 11.5*  --   HGB 7.3* 7.3*  --   HCT 23.4* 22.4*  --   PLT 279 271  --   CREATININE 9.87*  --   --   MG  --   --  2.2  PHOS 7.0*  --   --   ALBUMIN 2.1*  --   --    Estimated Creatinine Clearance: 7.4 mL/min (by C-G formula based on Cr of 9.87).     Medications:  Prescriptions prior to admission  Medication Sig Dispense Refill Last Dose  . amLODipine (NORVASC) 5 MG tablet Take 5 mg by mouth at bedtime.     . budesonide-formoterol (SYMBICORT) 160-4.5 MCG/ACT inhaler Inhale 2 puffs into the lungs 2 (two) times daily.     . chlorpheniramine (CHLOR-TRIMETON) 4 MG tablet Take 4 mg by mouth every 4 (four) hours as needed for allergies.     Marland Kitchen loperamide (IMODIUM) 2 MG capsule Take 2 mg by mouth every 4 (four) hours as needed for diarrhea or loose stools.     . magnesium oxide (MAG-OX) 400 MG tablet Take 400 mg by mouth at bedtime.     . NONFORMULARY OR COMPOUNDED ITEM Take 1 tablet by mouth daily. Rena-Vite Vitamin B Complex with C and Folic Acid     . sevelamer carbonate (RENVELA) 800 MG tablet Take 800 mg by mouth 3 (three) times daily with meals.     . Tiotropium Bromide Monohydrate 2.5 MCG/ACT AERS Inhale 1 puff into the lungs daily.      Scheduled:  . [START ON 01/27/2015] amiodarone  200 mg Oral BID  . [START ON 02/03/2015] amiodarone  200 mg Oral Daily  . amiodarone  400 mg Oral BID  . atorvastatin  40 mg Oral Daily  . ciprofloxacin  400 mg Intravenous Q24H  . diltiazem  60 mg Oral 3 times per day  . sodium chloride  10-30 mL Intravenous Q12H    Assessment:  Patient with ESRD  requiring HD on Tues, Thurs, Sat schedule.  Goal of Therapy:  Adjustment of medications for renal function.  Plan:  No renal adjustments warranted at this time.  Will continue to follow. Larene Beach, PharmD

## 2015-01-25 NOTE — Progress Notes (Signed)
  SUBJECTIVE:  Review of Systems:  Subjective/Chief Complaint Admitted fro encephalopathy and SOB. Found to have Afib with RVR and hypotension in ED. Septic shock due to UTI. now he is awake and mental status is improved. Off levophed and amiodarone. Cloudy urine with some blood clots. No complaints of pain or SOB. Had multiple vomits - noted ileus, so NGT placed 01/22/15. Feels much better now. Still no BM or passing gas. Xray today looks worse.   Fever/Chills No   Cough No   Sputum No   Abdominal Pain No   Diarrhea No   Constipation No   Nausea/Vomiting no   SOB/DOE No   Chest Pain No   Telemetry Reviewed Afib   Dysuria No   Medications/Allergies Reviewed Medications/Allergies reviewed   VITAL SIGNS/ANCILLARY NOTES: **Vital Signs.: Filed Vitals:   01/25/15 1734  BP: 115/59  Pulse: 88  Temp: 97.8 F (36.6 C)  Resp: 22    Physical Exam:  GEN No distress.   HEENT pale conjunctivae, hearing intact to voice, dry oral mucosa,NGT in place.   NECK supple  No masses   RESP normal resp effort  clear BS   CARD irregular rate  No LE edema   ABD denies tenderness , distended, soft  sluggish BS.   GU foley catheter in place  cloudy urine   EXTR negative cyanosis/clubbing, negative edema   SKIN No rashes, No ulcers   NEURO negative rigidity, negative tremor, follows commands   PSYCH alert, A+O to time, place, person   ASSESSMENT/PLAN:  Assessment/Plan:  Orders/Results reviewed Orders reviewed and updated, Laboratory results reviewed, Radiology results reviewed, Vital Signs reviewed, Nursing documentation reviewed   Assessment/Plan 26 m with Bladder/prostate cancer, ESRD on HD, hypertension here with confusion, SOB  * Septic shock due to UTI   Had ureteral stents- seen by urology and ID.   Was On Levophed for Map >65- improved-     BP   stable.  Continued broad spectrum abx. May taper soon.  negative final cx. Appreciated ID consult.   repeated cx just  2000CFU gr neg rods.  * Afib with RVR due to sepsis was On amiodarone drip. Appreciate cardiology help- now swiched to oral amiodarone.   As per cardiology eventually need Cath- no urgency.  due to tachycardia- added cardizem   Not able to take meds much due to Ileus, but HR is stable so far.  * Ileus   NPO for now, NGT suction. Still no BM or passing gas. Give dulcolax suppository.   Monitor. Encourage PT.   Repeat Xray- was showing less distension  But now it is more distended, called surgical consult.  * ESRD on HD   manage per nephrology  * Anemia of Chronic Disease with worsening  Transfused 1 unit PRBC   * Acute resp failure  Aspiration?  Monitor.   Now on room air.    Discharge Planning SNF/LTC   Code Status Full Code   Critical Care no, Total patient care time:, 35 minutes   Case Discussed With patient, family, nursing

## 2015-01-25 NOTE — Progress Notes (Signed)
Patient A/O, denies pain. No noted distress. Held cardizem resulted to low BP. Flushed NG as per order. Turned patient every 2 hours. Staff will continue to monitor and meet needs.

## 2015-01-26 ENCOUNTER — Inpatient Hospital Stay: Payer: Medicare Other

## 2015-01-26 ENCOUNTER — Encounter: Payer: Self-pay | Admitting: Internal Medicine

## 2015-01-26 LAB — PREPARE RBC (CROSSMATCH)

## 2015-01-26 LAB — CBC
HCT: 19.4 % — ABNORMAL LOW (ref 40.0–52.0)
Hemoglobin: 6.3 g/dL — ABNORMAL LOW (ref 13.0–18.0)
MCH: 29.3 pg (ref 26.0–34.0)
MCHC: 32.7 g/dL (ref 32.0–36.0)
MCV: 89.6 fL (ref 80.0–100.0)
Platelets: 258 10*3/uL (ref 150–440)
RBC: 2.16 MIL/uL — ABNORMAL LOW (ref 4.40–5.90)
RDW: 18.6 % — ABNORMAL HIGH (ref 11.5–14.5)
WBC: 8.9 10*3/uL (ref 3.8–10.6)

## 2015-01-26 LAB — RENAL FUNCTION PANEL
Albumin: 2.1 g/dL — ABNORMAL LOW (ref 3.5–5.0)
Anion gap: 13 (ref 5–15)
BUN: 41 mg/dL — ABNORMAL HIGH (ref 6–20)
CALCIUM: 7.4 mg/dL — AB (ref 8.9–10.3)
CO2: 28 mmol/L (ref 22–32)
CREATININE: 8 mg/dL — AB (ref 0.61–1.24)
Chloride: 100 mmol/L — ABNORMAL LOW (ref 101–111)
GFR calc Af Amer: 7 mL/min — ABNORMAL LOW (ref >60)
GFR calc non Af Amer: 6 mL/min — ABNORMAL LOW (ref >60)
Glucose, Bld: 87 mg/dL (ref 65–99)
Phosphorus: 6.2 mg/dL — ABNORMAL HIGH (ref 2.5–4.6)
Potassium: 3.3 mmol/L — ABNORMAL LOW (ref 3.5–5.1)
Sodium: 141 mmol/L (ref 135–145)

## 2015-01-26 LAB — VANCOMYCIN, RANDOM: Vancomycin Rm: 13 ug/mL

## 2015-01-26 LAB — ABO/RH: ABO/RH(D): A NEG

## 2015-01-26 LAB — HEMOGLOBIN AND HEMATOCRIT, BLOOD
HEMATOCRIT: 23.3 % — AB (ref 40.0–52.0)
HEMOGLOBIN: 7.5 g/dL — AB (ref 13.0–18.0)

## 2015-01-26 MED ORDER — ACETAMINOPHEN 325 MG PO TABS
650.0000 mg | ORAL_TABLET | Freq: Once | ORAL | Status: AC
  Administered 2015-01-26: 650 mg via ORAL
  Filled 2015-01-26: qty 2

## 2015-01-26 MED ORDER — AMIODARONE HCL IN DEXTROSE 360-4.14 MG/200ML-% IV SOLN
30.0000 mg/h | INTRAVENOUS | Status: DC
  Administered 2015-01-27 – 2015-02-03 (×10): 30 mg/h via INTRAVENOUS
  Filled 2015-01-26 (×18): qty 200

## 2015-01-26 MED ORDER — SODIUM CHLORIDE 0.9 % IV SOLN
Freq: Once | INTRAVENOUS | Status: AC
  Administered 2015-01-26: 13:00:00 via INTRAVENOUS

## 2015-01-26 MED ORDER — AMIODARONE LOAD VIA INFUSION
150.0000 mg | Freq: Once | INTRAVENOUS | Status: AC
  Administered 2015-01-26: 150 mg via INTRAVENOUS
  Filled 2015-01-26: qty 83.34

## 2015-01-26 MED ORDER — AMIODARONE HCL IN DEXTROSE 360-4.14 MG/200ML-% IV SOLN
60.0000 mg/h | INTRAVENOUS | Status: AC
  Administered 2015-01-26: 60 mg/h via INTRAVENOUS
  Filled 2015-01-26: qty 200

## 2015-01-26 NOTE — Progress Notes (Signed)
When patient returned from dialysis his heart rate was elevated 130s-150s and he had converted to A Fib, and he was very uncomfortable in the bed.  We tried to get him up in the chair and his heart rate jumped up to 180s while moving.  He ended up getting back in the bed, and settled down.  He still remained in A Fib.  Dr. Anselm Jungling was notified and ordered to give his oral medications, which the HR did come down some.  Dr. Rockey Situ came to see the patient and he is going to transfer the patient to 2A to be put on an Amioderone drip.

## 2015-01-26 NOTE — Progress Notes (Signed)
  SUBJECTIVE:  Review of Systems:  Subjective/Chief Complaint Admitted fro encephalopathy and SOB. Found to have Afib with RVR and hypotension in ED. Septic shock due to UTI. now he is awake and mental status is improved. Off levophed and amiodarone. Cloudy urine with some blood clots. No complaints of pain or SOB. Had multiple vomits - noted ileus, so NGT placed 01/22/15.  Still no BM or passing gas. today converted back to A fib with RVR. hav NGT suction.   Fever/Chills No   Cough No   Sputum No   Abdominal Pain No   Diarrhea No   Constipation No   Nausea/Vomiting no   SOB/DOE No   Chest Pain No   Telemetry Reviewed Afib   Dysuria No   Medications/Allergies Reviewed Medications/Allergies reviewed   VITAL SIGNS/ANCILLARY NOTES: **Vital Signs.: Filed Vitals:   01/26/15 1958  BP: 103/57  Pulse: 103  Temp: 98.3 F (36.8 C)  Resp: 18    Physical Exam:  GEN No distress.   HEENT pale conjunctivae, hearing intact to voice, dry oral mucosa,NGT in place.   NECK supple  No masses   RESP normal resp effort  clear BS, decreased sound on left side.   CARD irregular rate  No LE edema, tachycardia.   ABD denies tenderness , distended, soft  sluggish BS.   GU foley catheter in place  cloudy urine   EXTR negative cyanosis/clubbing, negative edema   SKIN No rashes, No ulcers   NEURO negative rigidity, negative tremor, follows commands   PSYCH alert, A+O to time, place, person   ASSESSMENT/PLAN:  Assessment/Plan:  Orders/Results reviewed Orders reviewed and updated, Laboratory results reviewed, Radiology results reviewed, Vital Signs reviewed, Nursing documentation reviewed   Assessment/Plan 49 m with Bladder/prostate cancer, ESRD on HD, hypertension here with confusion, SOB   * Afib with RVR due to sepsis was On amiodarone drip. Appreciate cardiology help- swiched to oral amiodarone.   As per cardiology eventually need Cath- no urgency.  due to  tachycardia- added cardizem   Not able to take meds much due to Ileus, but HR was stable till 01/25/15, converted to A fib with RVR 01/26/15- called Cardiology again.  * Ileus   NPO for now, NGT suction since 01/22/15. Still no BM or passing gas. Given dulcolax suppository.   Monitor. Encourage PT.   Repeat Xray- was showing less distension  But now it is more distended, called surgical consult. Agreed with conservative plan.  * Septic shock due to UTI   Had ureteral stents- seen by urology and ID.   Was On Levophed for Map >65- improved-     BP   stable.  Initially broad spectrum abx. Per ID tapered to cipro. Appreciated ID consult.   repeated cx just 2000CFU gr neg rods.  * ESRD on HD   manage per nephrology  * Anemia of Chronic Disease with worsening  Transfused 1 unit PRBC    Target Hb should be around 8.    Transfused per cardio.  * pleural effusion and possible atelactesis on left lower lobe- will get CT chest to evaluate further.  * Acute resp failure on admission  Aspiration?  Monitor.   Now on room air.    Discharge Planning SNF/LTC   Code Status Full Code   Critical Care no, Total patient care time:, 35 minutes   Case Discussed With patient, family, nursing

## 2015-01-26 NOTE — Progress Notes (Signed)
Patient: [name] / Admit Date: 01/18/2015 / Date of Encounter: 01/26/2015, 6:54 PM   Subjective: NGT still in place, not eating, Converted to atrial fibrillation at 1 PM, elevated heart rate   Review of Systems: ROS  Review of Systems  Constitutional: Positive for malaise/fatigue. Negative for fever, chills, weight loss and diaphoresis.  Respiratory:  Negative for cough, hemoptysis and sputum production.  Cardiovascular: Negative for chest pain, palpitations, orthopnea, claudication, leg swelling and PND.  Gastrointestinal: Positive for constipation. Negative for heartburn, nausea, vomiting, abdominal pain, diarrhea, blood in stool and melena.  Skin: Negative for itching and rash.  Neurological: Positive for weakness. Negative for dizziness and loss of consciousness.  Psychiatric/Behavioral: The patient is not nervous/anxious.   Objective: Telemetry: atrial fibrillation Physical Exam: Blood pressure 109/53, pulse 125, temperature 97.6 F (36.4 C), temperature source Oral, resp. rate 25, height 5' 6.97" (1.701 m), weight 216 lb 7.9 oz (98.2 kg), SpO2 99 %. Body mass index is 33.94 kg/(m^2). General: Well developed, well nourished, in no acute distress. Head: Normocephalic, atraumatic, sclera non-icteric, no xanthomas, nares are without discharge. Neck: Negative for carotid bruits. JVP not elevated. Lungs: Clear bilaterally to auscultation without wheezes, rales, or rhonchi. Breathing is unlabored. Heart: RRR S1 S2 without murmurs, rubs, or gallops.  Abdomen: Soft, non-tender, non-distended with normoactive bowel sounds. No rebound/guarding. Extremities: No clubbing or cyanosis. No edema. Distal pedal pulses are 2+ and equal bilaterally. Neuro: Alert and oriented X 3. Moves all extremities spontaneously. Psych:  Responds to questions appropriately with a normal affect.   Intake/Output Summary (Last 24 hours) at 01/26/15 1854 Last data filed at 01/26/15 1528  Gross  per 24 hour  Intake 619.34 ml  Output   1100 ml  Net -480.66 ml    Inpatient Medications:  . amiodarone  150 mg Intravenous Once  . atorvastatin  40 mg Oral Daily  . ciprofloxacin  400 mg Intravenous Q24H  . diltiazem  60 mg Oral 3 times per day  . epoetin (EPOGEN/PROCRIT) injection  10,000 Units Intravenous Once  . sodium chloride  10-40 mL Intracatheter Q12H   Infusions:  . sodium chloride 10 mL/hr at 01/26/15 0900  . amiodarone     Followed by  . [START ON 01/27/2015] amiodarone      Labs:  Recent Labs  01/24/15 0940 01/25/15 1246 01/26/15 0510  NA  --  142 141  K  --  3.7 3.3*  CL  --  100* 100*  CO2  --  27 28  GLUCOSE  --  86 87  BUN  --  34* 41*  CREATININE  --  7.06* 8.00*  CALCIUM  --  7.7* 7.4*  MG 2.2  --   --   PHOS  --  5.4* 6.2*    Recent Labs  01/25/15 1246 01/26/15 0510  ALBUMIN 2.3* 2.1*    Recent Labs  01/25/15 1246 01/26/15 0510 01/26/15 1745  WBC 9.5 8.9  --   HGB 7.4* 6.3* 7.5*  HCT 23.6* 19.4* 23.3*  MCV 89.2 89.6  --   PLT 302 258  --    No results for input(s): CKTOTAL, CKMB, TROPONINI in the last 72 hours. Invalid input(s): POCBNP No results for input(s): HGBA1C in the last 72 hours.   Weights: Filed Weights   01/24/15 0926 01/24/15 1313 01/26/15 1049  Weight: 213 lb 13.5 oz (97 kg) 215 lb 9.8 oz (97.8 kg) 216 lb 7.9 oz (98.2 kg)     Radiology/Studies:  Dg Chest 2 View  01/26/2015   CLINICAL DATA:  CURRENT PNA, SBO, PT HAS NG TUBE, PT IS ON DIALYSIS AND HAS HX OF PAF AND CHF  EXAM: CHEST  2 VIEW  COMPARISON:  01/18/2015  FINDINGS: Moderate enlargement of the cardiopericardial silhouette. Small left and probable small right pleural effusions.  No mediastinal or hilar masses.  Mild lung base opacity, most likely atelectasis. On the left, pneumonia should be considered likely if there are consistent clinical symptoms.  No pulmonary edema.  No pneumothorax.  Nasogastric tube, new, passes below the diaphragm into the stomach.   Right internal jugular central venous line has its tip in the upper superior vena cava, which appears retracted superiorly from the previous exam.  IMPRESSION: 1. Stable cardiomegaly. 2. Lung base opacity, left greater than right. There is a small left pleural effusion and a probable small right pleural effusion. Additional lung base opacity is likely atelectasis. Pneumonia in the left is possible P the appearance is similar to the most recent prior exam. 3. No pulmonary edema.  No new lung abnormalities. 4. Right IJ central venous line appears retracted. Tip now projects in the upper superior vena cava. 5. Nasogastric tube passes below the diaphragm into the stomach, new from the prior study.   Electronically Signed   By: Lajean Manes M.D.   On: 01/26/2015 14:41     Assessment and Plan  74 y.o. male 74 year old male with history of ESRD on HD Tuesday, Thursday, and Saturday, prostate CA s/p BCG injections and Lupron injections, bladder CA s/p ureteral stents and neurogenic bladder, abdominal aortic aneurysm and iliac aneurysm, status post repair who presented to [facility] ED on 4/27 in acute respiratory distress x 1 day and was found to be hypotensive with blood pressures in the 51V sytolic and was found to be in new onset a-fib.  1. New onset a-fib with RVR and hypotension:  Converted back to atrial fibrillation at 1pm, possibly during HD Still not taking pills well, NGT in place, NPO --will restart amiodarone infusion in effort to convert back to NSR, Holding anticoagulation for now given anemia Will likely need to stay on amio infusion until tolerating po/diet, NGT out, have BMs  2. 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 Septic shock/UTI: -On Meropenem  and vancomycin per IM  -Off pressors  4. ESRD on HD: -Tues, Thursday, Saturday -Per nephrology. Had HD today, possibly converted to atrial fib during HD  5. Hypoxia: -2/2 septic shock -Stable   6. SBO: -NG tube in place Still not tolerating diet  7. Anemia: -Pending this morning -Goal hgb 8 to 8.5  Signed,  Esmond Plants, MD

## 2015-01-26 NOTE — Progress Notes (Signed)
[facility]  ROUNDING NOTE   Subjective:   Seen and examined on hemodialysis. Tolerating treatment well. Patient ordered 1 unit PRBC for during dialysis.  Claims to have had a bowel movement.   Objective:  Vital signs in last 24 hours:  Temp:  [97.5 F (36.4 C)-98.7 F (37.1 C)] 97.5 F (36.4 C) (05/05 1049) Pulse Rate:  [77-88] 77 (05/05 1049) Resp:  [20-22] 20 (05/05 0820) BP: (100-120)/(57-70) 100/70 mmHg (05/05 1049) SpO2:  [84 %-100 %] 100 % (05/05 0820) Weight:  [98.2 kg (216 lb 7.9 oz)] 98.2 kg (216 lb 7.9 oz) (05/05 1049)  Weight change:  Filed Weights   01/24/15 0926 01/24/15 1313 01/26/15 1049  Weight: 97 kg (213 lb 13.5 oz) 97.8 kg (215 lb 9.8 oz) 98.2 kg (216 lb 7.9 oz)    Intake/Output: I/O last 3 completed shifts: In: 960.3 [I.V.:329.3; Other:100; NG/GT:140; IV Piggyback:391] Out: 2325 [Urine:1025; Emesis/NG output:1300]   Intake/Output this shift:  Total I/O In: 75 [I.V.:45; NG/GT:30] Out: 100 [Urine:100]  General: not in acute distress Head: +NGT to suction Neck: supple CVS: regular rate and rhythm Resp: clear  ABD: +distended EXT: no edema Neuro: alert and oriented Access: Left arm AVF   Basic Metabolic Panel:  Recent Labs Lab 01/20/15 0430  01/22/15 0435 01/23/15 1445 01/24/15 0940 01/25/15 1246 01/26/15 0510  NA 141  --  142 143  --  142 141  K 3.6  --  3.9 4.1  --  3.7 3.3*  CL 99*  --  98* 101  --  100* 100*  CO2 30  --  31 24  --  27 28  GLUCOSE 119*  --  94 76  --  86 87  BUN 32*  --  38* 56*  --  34* 41*  CREATININE 6.73*  --  8.36* 9.87*  --  7.06* 8.00*  CALCIUM 7.8*  < > 7.9* 8.0*  --  7.7* 7.4*  MG  --   --   --   --  2.2  --   --   PHOS  --   --  5.3* 7.0*  --  5.4* 6.2*  < > = values in this interval not displayed.  Liver Function Tests:  Recent Labs Lab 01/23/15 1445 01/25/15 1246 01/26/15 0510  ALBUMIN 2.1* 2.3* 2.1*   No results for input(s): LIPASE, AMYLASE in the last 168 hours. No results  for input(s): AMMONIA in the last 168 hours.  CBC:  Recent Labs Lab 01/20/15 0430 01/22/15 0435 01/23/15 1445 01/24/15 0527 01/25/15 1246 01/26/15 0510  WBC 11.7* 12.9* 13.6* 11.5* 9.5 8.9  NEUTROABS 9.5*  --   --   --   --   --   HGB 6.9* 7.0* 7.3* 7.3* 7.4* 6.3*  HCT 21.5* 22.0* 23.4* 22.4* 23.6* 19.4*  MCV 89 89.0 90.3 89.7 89.2 89.6  PLT 176 220 279 271 302 258    Cardiac Enzymes: No results for input(s): CKTOTAL, CKMB, CKMBINDEX, TROPONINI in the last 168 hours.  BNP: Invalid input(s): POCBNP  CBG: No results for input(s): GLUCAP in the last 168 hours.  Microbiology: Results for orders placed or performed during the hospital encounter of 01/18/15  Culture, blood (single)     Status: None (Preliminary result)   Collection Time: 01/18/15  7:11 PM  Result Value Ref Range Status   Micro Text Report   Preliminary       COMMENT  NO GROWTH IN 48 HOURS   ANTIBIOTIC                                                      Culture, blood (single)     Status: None (Preliminary result)   Collection Time: 01/18/15  7:11 PM  Result Value Ref Range Status   Micro Text Report   Preliminary       COMMENT                   NO GROWTH IN 48 HOURS   ANTIBIOTIC                                                      Urine culture     Status: None   Collection Time: 01/18/15  7:36 PM  Result Value Ref Range Status   Micro Text Report   Final       SOURCE: INDWELLING CATHETER    COMMENT                   NO GROWTH IN 36 HOURS   ANTIBIOTIC                                                      Urine culture     Status: None   Collection Time: 01/20/15  4:09 PM  Result Value Ref Range Status   Micro Text Report   Final       SOURCE: INDWELLING CATH    ORGANISM 1                2000 CFU/ML ENTEROBACTER SPECIES   COMMENT                   UNABLE TO FURTHER SPECIATE   COMMENT                   -   ANTIBIOTIC                    ORG#1     CEFAZOLIN                      R         CEFOXITIN                     R         CEFTRIAXONE                   R         CIPROFLOXACIN                 I         ERTAPENEM                     S         GENTAMICIN  S         IMIPENEM                      S         LEVOFLOXACIN                  I         NITROFURANTOIN                I         TRIMETH/SULFA                 S             Coagulation Studies: No results for input(s): LABPROT, INR in the last 72 hours.  Urinalysis: No results for input(s): COLORURINE, LABSPEC, PHURINE, GLUCOSEU, HGBUR, BILIRUBINUR, KETONESUR, PROTEINUR, UROBILINOGEN, NITRITE, LEUKOCYTESUR in the last 72 hours.  Invalid input(s): APPERANCEUR    Imaging: Dg Abd 1 View  01/26/2015   CLINICAL DATA:  Small-bowel obstruction.  EXAM: ABDOMEN - 1 VIEW  COMPARISON:  01/25/2015.  FINDINGS: NG tube tip projected over the upper stomach. Bilateral double-J ureteral stents. Aorto iliac stent graft. Hypogastric coils noted. Mild improvement of small bowel distention. Small bowel distention however remains. No free air. Persistent left lower lobe atelectasis and/or infiltrate with left pleural effusion.  IMPRESSION: 1. NG tube tip projected over the upper stomach. Again advancement of approximately 10 cm is suggested to ensure that the proximal port lies below the GE junction. 2. Persistent but slightly improved small bowel distention. 3. Persistent left lower lobe atelectasis and/or infiltrate with left pleural effusion.  These results will be called to the ordering clinician or representative by the Radiologist Assistant, and communication documented in the PACS or zVision Dashboard.   Electronically Signed   By: Marcello Moores  Register   On: 01/26/2015 07:29   Dg Abd 1 View  01/25/2015   CLINICAL DATA:  Abdominal pain, improving symptoms  EXAM: ABDOMEN - 1 VIEW  COMPARISON:  Supine abdominal film dated Jan 24, 2015  FINDINGS: There has been interval placement of a nasogastric tube. The  proximal port lies at or just below the level of the GE junction. There remain loops of mildly distended gas-filled small bowel. The caliber of the colon is normal. There small amount of rectal gas. Bilateral double-J ureteral stents are unchanged in position. There is an aorto bi-iliac stent graft in place. There are metallic coils in the pelvis likely from previous embolization procedures.  IMPRESSION: There has been slight interval increase in the volume of small bowel gas consistent with a mid to distal small bowel obstruction. Advancement of the nasogastric tube by 10 cm is recommended to assure that the proximal port lies below the GE junction.   Electronically Signed   By: David  Martinique M.D.   On: 01/25/2015 07:53     Medications:   . sodium chloride 10 mL/hr at 01/26/15 0900   . sodium chloride   Intravenous Once  . acetaminophen  650 mg Oral Once  . [START ON 01/27/2015] amiodarone  200 mg Oral BID  . [START ON 02/03/2015] amiodarone  200 mg Oral Daily  . amiodarone  400 mg Oral BID  . atorvastatin  40 mg Oral Daily  . ciprofloxacin  400 mg Intravenous Q24H  . diltiazem  60 mg Oral 3 times per day  . epoetin (EPOGEN/PROCRIT) injection  10,000 Units Intravenous Once  . metoCLOPramide (REGLAN) injection  10 mg Intravenous 3 times per day  . sodium chloride  10-40 mL Intracatheter Q12H   sodium chloride, sodium chloride, acetaminophen, ondansetron (ZOFRAN) IV, phenol, sodium chloride, sodium chloride  Assessment/ Plan:  75 y.o. 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  - tolerating dialysis now.  - potassium low, 3 K bath  2. Anemia of chronic kidney disease: D63.1 hgb 6.3 - holding epo due to prostate cancer.  - 1 unit PRBC with HD today.   3. Secondary Hyperparathyroidism: N25.81 phos at goal. Currently NPO and not on binders. On sevelamer as outpatient.   4. Ileus: K56.7 NGT to suction. Continue supportive care. Appreciate surgery input  5. A. Fib with RVR: I48.91 converted to sinus rhythm, on amiodarone.  6. Hypertension: well controlled. Currently holding amlodipine.   Discussed     LOS: 8 Holden Maniscalco 5/5/201611:40 AM

## 2015-01-26 NOTE — Progress Notes (Signed)
Patient A/O, no noted distress. Denies pain. NG right nare patent and auscultated placement. Nurse technology reported patient had 2 loose stools on 7p-7a shift. Foley patent and secured. Staff will continue to  Monitor and meet needs.

## 2015-01-26 NOTE — Progress Notes (Signed)
PT Cancellation Note  Patient Details Name: [name] MRN: [id number] DOB: July 04, 1941   Cancelled Treatment:    Reason Eval/Treat Not Completed: Patient at procedure or test/unavailable;Medical issues which prohibited therapy.  In dialysis   Ramond Dial 01/26/2015, 11:19 AM   Mee Hives, PT [facility] Acute Rehab Dept. Number: [phone]

## 2015-01-26 NOTE — Progress Notes (Signed)
Dr. Fletcher Anon notified that pt had converted back to Afib with  HR in the 130s-150s. MD said he would come see the patient.

## 2015-01-26 NOTE — Progress Notes (Signed)
Noted PT recommendations. Pt not as candidate for [facility] due to insurance requirements.   CSW working on placement.

## 2015-01-26 NOTE — Progress Notes (Signed)
Dr. Anselm Jungling notified of patient converting into Afib with HR 130s to 150s.  MD gave order to go ahead and give amioderone and cardizem, and notify the cardiologist on call.

## 2015-01-26 NOTE — Progress Notes (Signed)
MEDICATION RELATED CONSULT NOTE - FOLLOW UP   Pharmacy Consult for Renal dosing in HD Patient Indication: Medication adjustments in HD patient  Allergies  Allergen Reactions  . Penicillins Hives    Patient Measurements: Height: 5' 6.97" (170.1 cm) Weight: 215 lb 9.8 oz (97.8 kg) IBW/kg (Calculated) : 66.03   Labs:  Recent Labs  01/23/15 1445 01/24/15 0527 01/24/15 0940 01/25/15 1246 01/26/15 0510  WBC 13.6* 11.5*  --  9.5 8.9  HGB 7.3* 7.3*  --  7.4* 6.3*  HCT 23.4* 22.4*  --  23.6* 19.4*  PLT 279 271  --  302 258  CREATININE 9.87*  --   --  7.06* 8.00*  MG  --   --  2.2  --   --   PHOS 7.0*  --   --  5.4* 6.2*  ALBUMIN 2.1*  --   --  2.3* 2.1*   Estimated Creatinine Clearance: 9.2 mL/min (by C-G formula based on Cr of 8).     Medications:  Prescriptions prior to admission  Medication Sig Dispense Refill Last Dose  . amLODipine (NORVASC) 5 MG tablet Take 5 mg by mouth at bedtime.     . budesonide-formoterol (SYMBICORT) 160-4.5 MCG/ACT inhaler Inhale 2 puffs into the lungs 2 (two) times daily.     . chlorpheniramine (CHLOR-TRIMETON) 4 MG tablet Take 4 mg by mouth every 4 (four) hours as needed for allergies.     Marland Kitchen loperamide (IMODIUM) 2 MG capsule Take 2 mg by mouth every 4 (four) hours as needed for diarrhea or loose stools.     . magnesium oxide (MAG-OX) 400 MG tablet Take 400 mg by mouth at bedtime.     . NONFORMULARY OR COMPOUNDED ITEM Take 1 tablet by mouth daily. Rena-Vite Vitamin B Complex with C and Folic Acid     . sevelamer carbonate (RENVELA) 800 MG tablet Take 800 mg by mouth 3 (three) times daily with meals.     . Tiotropium Bromide Monohydrate 2.5 MCG/ACT AERS Inhale 1 puff into the lungs daily.      Scheduled:  . sodium chloride   Intravenous Once  . acetaminophen  650 mg Oral Once  . [START ON 01/27/2015] amiodarone  200 mg Oral BID  . [START ON 02/03/2015] amiodarone  200 mg Oral Daily  . amiodarone  400 mg Oral BID  . atorvastatin  40 mg Oral  Daily  . ciprofloxacin  400 mg Intravenous Q24H  . diltiazem  60 mg Oral 3 times per day  . epoetin (EPOGEN/PROCRIT) injection  10,000 Units Intravenous Once  . metoCLOPramide (REGLAN) injection  10 mg Intravenous 3 times per day  . sodium chloride  10-40 mL Intracatheter Q12H    Assessment:  Patient with ESRD requiring HD on Tues, Thurs, Sat schedule.  Goal of Therapy:  Adjustment of medications for renal function.  Plan:  No renal adjustments warranted at this time.  Will continue to follow. Larene Beach, PharmD

## 2015-01-27 ENCOUNTER — Inpatient Hospital Stay: Payer: Medicare Other

## 2015-01-27 ENCOUNTER — Inpatient Hospital Stay (HOSPITAL_COMMUNITY): Payer: Medicare Other

## 2015-01-27 DIAGNOSIS — N138 Other obstructive and reflux uropathy: Secondary | ICD-10-CM

## 2015-01-27 DIAGNOSIS — C679 Malignant neoplasm of bladder, unspecified: Secondary | ICD-10-CM

## 2015-01-27 DIAGNOSIS — I313 Pericardial effusion (noninflammatory): Secondary | ICD-10-CM

## 2015-01-27 DIAGNOSIS — K567 Ileus, unspecified: Secondary | ICD-10-CM | POA: Insufficient documentation

## 2015-01-27 DIAGNOSIS — N186 End stage renal disease: Secondary | ICD-10-CM

## 2015-01-27 DIAGNOSIS — Z992 Dependence on renal dialysis: Secondary | ICD-10-CM

## 2015-01-27 DIAGNOSIS — A419 Sepsis, unspecified organism: Secondary | ICD-10-CM

## 2015-01-27 DIAGNOSIS — E119 Type 2 diabetes mellitus without complications: Secondary | ICD-10-CM

## 2015-01-27 DIAGNOSIS — I10 Essential (primary) hypertension: Secondary | ICD-10-CM

## 2015-01-27 DIAGNOSIS — D62 Acute posthemorrhagic anemia: Secondary | ICD-10-CM

## 2015-01-27 DIAGNOSIS — Z515 Encounter for palliative care: Secondary | ICD-10-CM

## 2015-01-27 DIAGNOSIS — I480 Paroxysmal atrial fibrillation: Secondary | ICD-10-CM

## 2015-01-27 LAB — CBC
HCT: 21.6 % — ABNORMAL LOW (ref 40.0–52.0)
Hemoglobin: 6.9 g/dL — ABNORMAL LOW (ref 13.0–18.0)
MCH: 28.5 pg (ref 26.0–34.0)
MCHC: 32.1 g/dL (ref 32.0–36.0)
MCV: 88.8 fL (ref 80.0–100.0)
Platelets: 259 10*3/uL (ref 150–440)
RBC: 2.43 MIL/uL — AB (ref 4.40–5.90)
RDW: 18 % — AB (ref 11.5–14.5)
WBC: 11.3 10*3/uL — ABNORMAL HIGH (ref 3.8–10.6)

## 2015-01-27 LAB — BASIC METABOLIC PANEL
ANION GAP: 13 (ref 5–15)
BUN: 26 mg/dL — ABNORMAL HIGH (ref 6–20)
CO2: 30 mmol/L (ref 22–32)
CREATININE: 5.61 mg/dL — AB (ref 0.61–1.24)
Calcium: 7.5 mg/dL — ABNORMAL LOW (ref 8.9–10.3)
Chloride: 97 mmol/L — ABNORMAL LOW (ref 101–111)
GFR calc non Af Amer: 9 mL/min — ABNORMAL LOW (ref >60)
GFR, EST AFRICAN AMERICAN: 10 mL/min — AB (ref >60)
Glucose, Bld: 97 mg/dL (ref 65–99)
POTASSIUM: 3.1 mmol/L — AB (ref 3.5–5.1)
Sodium: 140 mmol/L (ref 135–145)

## 2015-01-27 LAB — GLUCOSE, CAPILLARY: Glucose-Capillary: 97 mg/dL (ref 70–99)

## 2015-01-27 LAB — MAGNESIUM: Magnesium: 1.8 mg/dL (ref 1.7–2.4)

## 2015-01-27 LAB — PREPARE RBC (CROSSMATCH)

## 2015-01-27 LAB — POCT GASTRIC OCCULT BLOOD (1-CARD TO LAB): PH, GASTRIC: 2

## 2015-01-27 LAB — GASTRIC OCCULT BLOOD (1-CARD TO LAB): Occult Blood, Gastric: POSITIVE — AB

## 2015-01-27 MED ORDER — DIPHENHYDRAMINE HCL 25 MG PO CAPS
25.0000 mg | ORAL_CAPSULE | Freq: Every evening | ORAL | Status: DC | PRN
  Administered 2015-01-28 – 2015-02-03 (×7): 25 mg via ORAL
  Filled 2015-01-27 (×7): qty 1

## 2015-01-27 MED ORDER — CIPROFLOXACIN IN D5W 400 MG/200ML IV SOLN
400.0000 mg | INTRAVENOUS | Status: DC
  Administered 2015-01-27 – 2015-02-02 (×7): 400 mg via INTRAVENOUS
  Filled 2015-01-27 (×8): qty 200

## 2015-01-27 MED ORDER — CIPROFLOXACIN HCL 500 MG PO TABS: 500.0000 mg | ORAL_TABLET | Freq: Every day | ORAL | Status: DC

## 2015-01-27 MED ORDER — TRACE MINERALS CR-CU-MN-SE-ZN 10-1000-500-60 MCG/ML IV SOLN
INTRAVENOUS | Status: AC
  Administered 2015-01-27: 19:00:00 via INTRAVENOUS
  Filled 2015-01-27: qty 960

## 2015-01-27 MED ORDER — SULFAMETHOXAZOLE-TRIMETHOPRIM 800-160 MG PO TABS
1.0000 | ORAL_TABLET | Freq: Two times a day (BID) | ORAL | Status: DC
  Administered 2015-01-27 – 2015-01-29 (×5): 1 via ORAL
  Filled 2015-01-27 (×5): qty 1

## 2015-01-27 MED ORDER — SODIUM CHLORIDE 0.9 % IV SOLN
Freq: Once | INTRAVENOUS | Status: AC
  Administered 2015-01-27: 11:00:00 via INTRAVENOUS

## 2015-01-27 MED ORDER — INSULIN ASPART 100 UNIT/ML ~~LOC~~ SOLN
0.0000 [IU] | Freq: Four times a day (QID) | SUBCUTANEOUS | Status: DC
  Administered 2015-01-27 – 2015-02-03 (×10): 1 [IU] via SUBCUTANEOUS
  Filled 2015-01-27 (×10): qty 1

## 2015-01-27 NOTE — Progress Notes (Signed)
[facility]  ROUNDING NOTE   Subjective:   Atrial fibrillation after HD. Given bolus of 526mL after treatment. Going in and out of sinus since.  Cousin at bedside.  PRBC 1 unit transfusion yesterday. However hemoglobin 6.9 today Hemodialysis yesterday. Tolerated treatment well.   Objective:  Vital signs in last 24 hours:  Temp:  [97.1 F (36.2 C)-98.3 F (36.8 C)] 98.2 F (36.8 C) (05/06 0448) Pulse Rate:  [77-136] 78 (05/06 0448) Resp:  [14-25] 20 (05/06 0448) BP: (76-119)/(51-70) 108/59 mmHg (05/06 0448) SpO2:  [98 %-100 %] 99 % (05/06 0448) Weight:  [94.8 kg (208 lb 15.9 oz)-98.2 kg (216 lb 7.9 oz)] 94.8 kg (208 lb 15.9 oz) (05/06 0448)  Weight change:  Filed Weights   01/26/15 1049 01/26/15 2109 01/27/15 0448  Weight: 98.2 kg (216 lb 7.9 oz) 94.938 kg (209 lb 4.8 oz) 94.8 kg (208 lb 15.9 oz)    Intake/Output: I/O last 3 completed shifts: In: 4015.3 [I.V.:3545.3; Blood:280; Other:100; NG/GT:90] Out: 1550 [Urine:700; Emesis/NG output:850]   Intake/Output this shift:     General: not in acute distress Head: +NGT to suction Neck: supple CVS: regular rate and rhythm Resp: clear  ABD: +distended, +bowel sounds EXT: no edema Neuro: alert and oriented Access: Left arm AVF   Basic Metabolic Panel:  Recent Labs Lab 01/22/15 0435 01/23/15 1445 01/24/15 0940 01/25/15 1246 01/26/15 0510 01/27/15 0500  NA 142 143  --  142 141 140  K 3.9 4.1  --  3.7 3.3* 3.1*  CL 98* 101  --  100* 100* 97*  CO2 31 24  --  27 28 30   GLUCOSE 94 76  --  86 87 97  BUN 38* 56*  --  34* 41* 26*  CREATININE 8.36* 9.87*  --  7.06* 8.00* 5.61*  CALCIUM 7.9* 8.0*  --  7.7* 7.4* 7.5*  MG  --   --  2.2  --   --   --   PHOS 5.3* 7.0*  --  5.4* 6.2*  --     Liver Function Tests:  Recent Labs Lab 01/23/15 1445 01/25/15 1246 01/26/15 0510  ALBUMIN 2.1* 2.3* 2.1*   No results for input(s): LIPASE, AMYLASE in the last 168 hours. No results for input(s): AMMONIA in the  last 168 hours.  CBC:  Recent Labs Lab 01/23/15 1445 01/24/15 0527 01/25/15 1246 01/26/15 0510 01/26/15 1745 01/27/15 0500  WBC 13.6* 11.5* 9.5 8.9  --  11.3*  HGB 7.3* 7.3* 7.4* 6.3* 7.5* 6.9*  HCT 23.4* 22.4* 23.6* 19.4* 23.3* 21.6*  MCV 90.3 89.7 89.2 89.6  --  88.8  PLT 279 271 302 258  --  259    Cardiac Enzymes: No results for input(s): CKTOTAL, CKMB, CKMBINDEX, TROPONINI in the last 168 hours.  BNP: Invalid input(s): POCBNP  CBG: No results for input(s): GLUCAP in the last 168 hours.  Microbiology: Results for orders placed or performed during the hospital encounter of 01/18/15  Culture, blood (single)     Status: None (Preliminary result)   Collection Time: 01/18/15  7:11 PM  Result Value Ref Range Status   Micro Text Report   Preliminary       COMMENT                   NO GROWTH IN 48 HOURS   ANTIBIOTIC  Culture, blood (single)     Status: None (Preliminary result)   Collection Time: 01/18/15  7:11 PM  Result Value Ref Range Status   Micro Text Report   Preliminary       COMMENT                   NO GROWTH IN 48 HOURS   ANTIBIOTIC                                                      Urine culture     Status: None   Collection Time: 01/18/15  7:36 PM  Result Value Ref Range Status   Micro Text Report   Final       SOURCE: INDWELLING CATHETER    COMMENT                   NO GROWTH IN 36 HOURS   ANTIBIOTIC                                                      Urine culture     Status: None   Collection Time: 01/20/15  4:09 PM  Result Value Ref Range Status   Micro Text Report   Final       SOURCE: INDWELLING CATH    ORGANISM 1                2000 CFU/ML ENTEROBACTER SPECIES   COMMENT                   UNABLE TO FURTHER SPECIATE   COMMENT                   -   ANTIBIOTIC                    ORG#1     CEFAZOLIN                     R         CEFOXITIN                     R          CEFTRIAXONE                   R         CIPROFLOXACIN                 I         ERTAPENEM                     S         GENTAMICIN                    S         IMIPENEM                      S         LEVOFLOXACIN  I         NITROFURANTOIN                I         TRIMETH/SULFA                 S             Coagulation Studies: No results for input(s): LABPROT, INR in the last 72 hours.  Urinalysis: No results for input(s): COLORURINE, LABSPEC, PHURINE, GLUCOSEU, HGBUR, BILIRUBINUR, KETONESUR, PROTEINUR, UROBILINOGEN, NITRITE, LEUKOCYTESUR in the last 72 hours.  Invalid input(s): APPERANCEUR    Imaging: Dg Chest 2 View  01/26/2015   CLINICAL DATA:  CURRENT PNA, SBO, PT HAS NG TUBE, PT IS ON DIALYSIS AND HAS HX OF PAF AND CHF  EXAM: CHEST  2 VIEW  COMPARISON:  01/18/2015  FINDINGS: Moderate enlargement of the cardiopericardial silhouette. Small left and probable small right pleural effusions.  No mediastinal or hilar masses.  Mild lung base opacity, most likely atelectasis. On the left, pneumonia should be considered likely if there are consistent clinical symptoms.  No pulmonary edema.  No pneumothorax.  Nasogastric tube, new, passes below the diaphragm into the stomach.  Right internal jugular central venous line has its tip in the upper superior vena cava, which appears retracted superiorly from the previous exam.  IMPRESSION: 1. Stable cardiomegaly. 2. Lung base opacity, left greater than right. There is a small left pleural effusion and a probable small right pleural effusion. Additional lung base opacity is likely atelectasis. Pneumonia in the left is possible P the appearance is similar to the most recent prior exam. 3. No pulmonary edema.  No new lung abnormalities. 4. Right IJ central venous line appears retracted. Tip now projects in the upper superior vena cava. 5. Nasogastric tube passes below the diaphragm into the stomach, new from the prior study.   Electronically  Signed   By: Lajean Manes M.D.   On: 01/26/2015 14:41   Dg Abd 1 View  01/26/2015   CLINICAL DATA:  Increased abdominal distension  EXAM: ABDOMEN - 1 VIEW  COMPARISON:  None.  FINDINGS: There is gaseous distention of small bowel and colon. There is no evidence of pneumoperitoneum, portal venous gas or pneumatosis. There are no pathologic calcifications along the expected course of the ureters. There are bilateral ureteral stents. There is an aorto bi-iliac stent graft present. There are embolization coils which are likely within the bilateral internal iliac arteries. 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  IMPRESSION: Gaseous distention of small bowel colon concerning for an ileus.   Electronically Signed   By: Kathreen Devoid   On: 01/26/2015 21:21   Dg Abd 1 View  01/26/2015   CLINICAL DATA:  Small-bowel obstruction.  EXAM: ABDOMEN - 1 VIEW  COMPARISON:  01/25/2015.  FINDINGS: NG tube tip projected over the upper stomach. Bilateral double-J ureteral stents. Aorto iliac stent graft. Hypogastric coils noted. Mild improvement of small bowel distention. Small bowel distention however remains. No free air. Persistent left lower lobe atelectasis and/or infiltrate with left pleural effusion.  IMPRESSION: 1. NG tube tip projected over the upper stomach. Again advancement of approximately 10 cm is suggested to ensure that the proximal port lies below the GE junction. 2. Persistent but slightly improved small bowel distention. 3. Persistent left lower lobe atelectasis and/or infiltrate with left pleural effusion.  These results will be called to the ordering clinician or representative by the Radiologist Assistant, and communication  documented in the PACS or zVision Dashboard.   Electronically Signed   By: Marcello Moores  Register   On: 01/26/2015 07:29   Ct Chest Wo Contrast  01/26/2015   CLINICAL DATA:  Encephalopathy, shortness of breath  EXAM: CT CHEST WITHOUT CONTRAST  TECHNIQUE: Multidetector CT imaging of  the chest was performed following the standard protocol without IV contrast.  COMPARISON:  None.  FINDINGS: The central airways are patent. There are small bilateral pleural effusions, left greater than right. There is no focal consolidation. There is mild bibasilar atelectasis. There is no pneumothorax.  There are no pathologically enlarged axillary, hilar or mediastinal lymph nodes.  The heart size is normal. There is a moderate pericardial effusion measuring 3.2 cm along the left ventricle. 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 There is multi vessel coronary artery atherosclerosis involving the left main, lad, circumflex and RCA.  Review of bone windows demonstrates no focal lytic or sclerotic lesions.  Limited non-contrast images of the upper abdomen were obtained. There are 2 hypodense nodules arising from the right adrenal gland measuring 14 mm and 18 mm respectively most consistent with adrenal adenomas. There is nodularity of the left adrenal gland. There is a nasogastric tube with the tip in the stomach. Partially visualized is left hydronephrosis.  IMPRESSION: 1. Bilateral small pleural effusions, left greater than right with bibasilar atelectasis. 2. Interval development of a moderate pericardial effusion measuring 3.2 cm along the left lateral ventricle. 3. Multi vessel coronary artery atherosclerosis. 4. Distal aortic arch measures 4.7 cm in diameter. Recommend semi-annual imaging followup by CTA or MRA and referral to cardiothoracic surgery if not already obtained. This recommendation follows 2010 ACCF/AHA/AATS/ACR/ASA/SCA/SCAI/SIR/STS/SVM Guidelines for the Diagnosis and Management of Patients With Thoracic Aortic Disease. Circulation. 2010; 121: e266-e36   Electronically Signed   By: Kathreen Devoid   On: 01/26/2015 21:20     Medications:   . amiodarone 30 mg/hr (01/27/15 0513)   . atorvastatin  40 mg Oral Daily  . ciprofloxacin  500 mg Oral  Q0600  . diltiazem  60 mg Oral 3 times per day  . sodium chloride  10-40 mL Intracatheter Q12H   acetaminophen, ondansetron (ZOFRAN) IV, phenol, sodium chloride, sodium chloride  Assessment/ Plan:  74 y.o. 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 Potassium 3.1 - below dry weight due to GI losses.  - Plan for dialysis tomorrow. Continue TTS schedule.   - potassium low, 3 K bath  2. Anemia of chronic kidney disease: D63.1 hgb 6.9 - holding epo due to prostate cancer.  - 1 unit PRBC with HD 5/5 - scheduled 1 unit PRBC today.   3. Secondary Hyperparathyroidism: N25.81 phos at goal. Currently NPO and not on binders. On sevelamer as outpatient.   4. Ileus: K56.7 NGT to suction. Continue supportive care. Appreciate surgery input  5. A. Fib with RVR: I48.91 converted to sinus rhythm, on amiodarone. - Appreciate cards input.   6. Hypertension: well controlled. Currently holding amlodipine.   Discussed     LOS: 9 [name] 5/6/201610:48 AM

## 2015-01-27 NOTE — Progress Notes (Signed)
MEDICATION RELATED CONSULT NOTE - FOLLOW UP   Pharmacy Consult for Renal dosing in HD Patient Indication: Medication adjustments in HD patient  Allergies  Allergen Reactions  . Penicillins Hives    Patient Measurements: Height: 5' 6.97" (170.1 cm) Weight: 208 lb 15.9 oz (94.8 kg) IBW/kg (Calculated) : 66.03   Labs:  Recent Labs  01/24/15 0940  01/25/15 1246 01/26/15 0510 01/26/15 1745 01/27/15 0500  WBC  --   --  9.5 8.9  --  11.3*  HGB  --   < > 7.4* 6.3* 7.5* 6.9*  HCT  --   < > 23.6* 19.4* 23.3* 21.6*  PLT  --   --  302 258  --  259  CREATININE  --   --  7.06* 8.00*  --  5.61*  MG 2.2  --   --   --   --   --   PHOS  --   --  5.4* 6.2*  --   --   ALBUMIN  --   --  2.3* 2.1*  --   --   < > = values in this interval not displayed. Estimated Creatinine Clearance: 12.9 mL/min (by C-G formula based on Cr of 5.61).     Medications:  Prescriptions prior to admission  Medication Sig Dispense Refill Last Dose  . amLODipine (NORVASC) 5 MG tablet Take 5 mg by mouth at bedtime.     . budesonide-formoterol (SYMBICORT) 160-4.5 MCG/ACT inhaler Inhale 2 puffs into the lungs 2 (two) times daily.     . chlorpheniramine (CHLOR-TRIMETON) 4 MG tablet Take 4 mg by mouth every 4 (four) hours as needed for allergies.     Marland Kitchen loperamide (IMODIUM) 2 MG capsule Take 2 mg by mouth every 4 (four) hours as needed for diarrhea or loose stools.     . magnesium oxide (MAG-OX) 400 MG tablet Take 400 mg by mouth at bedtime.     . NONFORMULARY OR COMPOUNDED ITEM Take 1 tablet by mouth daily. Rena-Vite Vitamin B Complex with C and Folic Acid     . sevelamer carbonate (RENVELA) 800 MG tablet Take 800 mg by mouth 3 (three) times daily with meals.     . Tiotropium Bromide Monohydrate 2.5 MCG/ACT AERS Inhale 1 puff into the lungs daily.      Scheduled:  . atorvastatin  40 mg Oral Daily  . ciprofloxacin  400 mg Intravenous Q24H  . diltiazem  60 mg Oral 3 times per day  . epoetin (EPOGEN/PROCRIT)  injection  10,000 Units Intravenous Once  . sodium chloride  10-40 mL Intracatheter Q12H    Assessment:  Patient with ESRD requiring HD on Tues, Thurs, Sat schedule.  Goal of Therapy:  Adjustment of medications for renal function.  Plan:  No renal adjustments warranted at this time.  Will continue to follow.  Murrell Converse, PharmD Clinical Pharmacist 01/27/2015

## 2015-01-27 NOTE — Progress Notes (Addendum)
PARENTERAL NUTRITION CONSULT NOTE - INITIAL  Pharmacy Consult for Electrolyte Supplementation/Glucose Management Indication: TPN   Allergies  Allergen Reactions  . Penicillins Hives    Patient Measurements: Height: 5' 6.97" (170.1 cm) Weight: 208 lb 15.9 oz (94.8 kg) IBW/kg (Calculated) : 66.03   Vital Signs: Temp: 98.1 F (36.7 C) (05/06 1441) Temp Source: Oral (05/06 1441) BP: 132/69 mmHg (05/06 1441) Pulse Rate: 79 (05/06 1441) Intake/Output from previous day: 05/05 0701 - 05/06 0700 In: 3806 [I.V.:3496; Blood:280; NG/GT:30] Out: 1000 [Urine:700; Emesis/NG output:300] Intake/Output from this shift: Total I/O In: 5 [I.V.:5] Out: -   Labs:  Recent Labs  01/25/15 1246 01/26/15 0510 01/26/15 1745 01/27/15 0500  WBC 9.5 8.9  --  11.3*  HGB 7.4* 6.3* 7.5* 6.9*  HCT 23.6* 19.4* 23.3* 21.6*  PLT 302 258  --  259     Recent Labs  01/25/15 1246 01/26/15 0510 01/27/15 0500  NA 142 141 140  K 3.7 3.3* 3.1*  CL 100* 100* 97*  CO2 27 28 30   GLUCOSE 86 87 97  BUN 34* 41* 26*  CREATININE 7.06* 8.00* 5.61*  CALCIUM 7.7* 7.4* 7.5*  PHOS 5.4* 6.2*  --   ALBUMIN 2.3* 2.1*  --    Estimated Creatinine Clearance: 12.9 mL/min (by C-G formula based on Cr of 5.61).   No results for input(s): GLUCAP in the last 72 hours.  Medical History: Past Medical History  Diagnosis Date  . PAF (paroxysmal atrial fibrillation)     a. new onset 12/2014 in the setting of UTI, sepsis, hypotension, and anemia; b. not on long term anticoagulation given anemia; c. family aware of stroke risk, they are ok with this; d. on amiodarone   . Chronic combined systolic and diastolic CHF (congestive heart failure)     a. echo 12/2014: EF 25-30%, anterior wall wall motion abnormalities; b. planned ischemic evaluation once patient is stable medically  . ESRD on hemodialysis     a. Tuesday, Thursday, and Saturdays  . Anemia     a. baseline hgb ~ 8  . History of small bowel obstruction     a.  01/2015    Medications:  Scheduled:  . atorvastatin  40 mg Oral Daily  . ciprofloxacin  400 mg Intravenous Q24H  . diltiazem  60 mg Oral 3 times per day  . insulin aspart  0-9 Units Subcutaneous Q6H  . sodium chloride  10-40 mL Intracatheter Q12H   Infusions:  . amiodarone 30 mg/hr (01/27/15 0513)  . TPN (CLINIMIX) Adult without lytes      Insulin Requirements in the past 24 hours:  No SSI ordered  Assessment: Patient is a 74 yo male with ESRD requiring dialysis.  Dialysis schedule is [facility].  Patient with ileus requiring TPN.  Currently ordered Clinimix 5/15 no E at 40 mL/hr.  Goal rate of  80 mL/hr. Potassium today of 3.1.  Magnesium pending.  Will not order Phosphorus as expect that it will be elevated based on ESRD.  Potassium of 3.1.  Spoke with MD Kolluru regarding potassium supplementation.  No supplementation today.  Will supplement with 3K bath in HD.  Contact MD prior to potassium supplementation.  Plan:  Supplement potassium via 3K bath in HD on 5/7. Magnesium level pending.  Will order Novolog SSI q6h for blood glucose monitoring while on TPN.  Labs ordered in AM.  Pharmacy will continue to follow.  Murrell Converse, PharmD Clinical Pharmacist 01/27/2015     Magnesium level= 1.8. No need  for supplementation.

## 2015-01-27 NOTE — Progress Notes (Signed)
Physical Therapy Treatment Patient Details Name: [name] MRN: [id number] DOB: Apr 24, 1941 Today's Date: 01/27/2015    History of Present Illness 74 yo male with onset of septic shock and respriatory distress, hypotension and atelectasis, recent bladder history, PMHx:  a-fib with RVR, prostate CA, ESRD T TH [facility], neurogenic bladder, AAA, anemia,     PT Comments    Pt is fatigued from an hour OOB to chair and did help PT with LE strength to pivot to bed after that.  He is having min discomfort and mainly tired with low energy after HD yesterday.  Pt has not been able to be attempted to stand due to his fatigue from sitting efforts.  Will retry Monday if still here.  Follow Up Recommendations  CIR     Equipment Recommendations  None recommended by PT    Recommendations for Other Services Rehab consult     Precautions / Restrictions Precautions Precautions: Fall Restrictions Weight Bearing Restrictions: No    Mobility  Bed Mobility Overal bed mobility: Needs Assistance Bed Mobility: Sit to Supine;Rolling Rolling: Mod assist     Sit to supine: Max assist   General bed mobility comments: Pt has some difficulty with directing upper body toward pillow but can assist wtih legs.  Transfers Overall transfer level: Needs assistance Equipment used: 2 person hand held assist (Pad on bed) Transfers: Lateral/Scoot Geophysicist/field seismologist Transfers Sit to Stand: +2 physical assistance;Max assist   Squat pivot transfers: +2 physical assistance;Max assist    Lateral/Scoot Transfers: Max assist;+2 physical assistance;+2 safety/equipment General transfer comment: able to support on LE's a bit to help the pivot to chair, sliding on chair and bed and then the squat pivot as well  Ambulation/Gait             General Gait Details: unable   Stairs            Wheelchair Mobility    Modified Rankin (Stroke Patients Only)       Balance Overall balance assessment:  Needs assistance Sitting-balance support: Feet supported Sitting balance-Leahy Scale: Good Sitting balance - Comments: pt is demonstrating better effort and control in stting to help posiition and control transitions Postural control: Posterior lean   Standing balance-Leahy Scale: Poor                      Cognition Arousal/Alertness: Awake/alert Behavior During Therapy: WFL for tasks assessed/performed Overall Cognitive Status: Within Functional Limits for tasks assessed                      Exercises      General Comments General comments (skin integrity, edema, etc.): Pt is tired after hour OOB and is making an effort to help with standing effort, but PT catching him too tired to try to stand with walker      Pertinent Vitals/Pain Pain Assessment: No/denies pain    Home Living                      Prior Function            PT Goals (current goals can now be found in the care plan section) Acute Rehab PT Goals Patient Stated Goal: Walk again Progress towards PT goals: Progressing toward goals    Frequency  Min 2X/week    PT Plan Current plan remains appropriate    Co-evaluation             End  of Session Equipment Utilized During Treatment: Oxygen Activity Tolerance: Patient limited by fatigue;Patient limited by lethargy Patient left: in bed;with call bell/phone within reach;with bed alarm set     Time: 1000-1025 PT Time Calculation (min) (ACUTE ONLY): 25 min  Charges:  $Therapeutic Activity: 23-37 mins                    G Codes:      Ramond Dial 2015-02-22, 10:47 AM   Mee Hives, PT MS Acute Rehab Dept. Number: [phone]

## 2015-01-27 NOTE — Care Management Note (Signed)
Case Management Note  Patient Details  Name: [name] MRN: [id number] Date of Birth: Oct 18, 1940  Subjective/Objective:          Transferred to 2A.  It is documented by previous CM that patient is not a candidate for acute inpatient rehab.  It is documented that CSW is working on skilled nursing placement          Action/Plan:   Expected Discharge Date:                  Expected Discharge Plan:  Skilled nursing placement  In-House Referral:     Discharge planning Services   Post Acute Care Choice:    Choice offered to:     DME Arranged:    DME Agency:     HH Arranged:    [place] Agency:     Status of Service:    Medicare Important Message Given:   yes Date Medicare IM Given:   01/27/15 Medicare IM give by:   Joni Reining Date Additional Medicare IM Given:    Additional Medicare Important Message give by:     If discussed at [place] of Stay Meetings, dates discussed:    Additional Comments:  Katrina Stack, RN 01/27/2015, 8:34 AM

## 2015-01-27 NOTE — Care Management CHF Note (Signed)
Patient is also known esrd hemodialysis patient.  Notified dialysis coordinator

## 2015-01-27 NOTE — Clinical Social Work Note (Signed)
Report given to 2A CSW. CSW was able to reach patient's cousin: [name] yesterday via phone in order to let her know that [facility] had offered and that it has been accepted on their behalf due to this being their preference. CSW to facilitate discharge when time.  [name] MSW,LCSWA [phone]

## 2015-01-27 NOTE — Progress Notes (Signed)
Patient: [name] / Admit Date: 01/18/2015 / Date of Encounter: 01/27/2015, 10:47 AM   Subjective: Was back in a-fib with RVR on 5/5 while in HD. Placed back on amiodarone gtt as he is not taking anything by mouth 2/2 ileus. Converted to NSR at 11:00 PM on 5/5 on the above amiodarone gtt. No complaints of SOB, chest pain, palpitations. Chest CT on 5/5 showed interval development of moderate pericardial effusion measuring 3.2 cm along the left lateral ventricle Bilateral small pleural effusions, left greater than right with bibasilar atelectasis. Multivessel CAD. Distal aortic arch measure 4.7 cm in diameter.    Review of Systems: Review of Systems  Constitutional: Positive for malaise/fatigue. Negative for fever, chills and diaphoresis.  Respiratory: Negative for cough, hemoptysis, sputum production, shortness of breath and wheezing.   Cardiovascular: Negative for chest pain, palpitations, orthopnea, claudication, leg swelling and PND.  Gastrointestinal: Positive for constipation. Negative for abdominal pain, diarrhea, blood in stool and melena.  Neurological: Positive for weakness.    Objective: Telemetry: NSR, 70s, converted to NSR at 11 PM on 5/5 Physical Exam: Blood pressure 108/59, pulse 78, temperature 98.2 F (36.8 C), temperature source Oral, resp. rate 20, height 5' 6.97" (1.701 m), weight 208 lb 15.9 oz (94.8 kg), SpO2 99 %. Body mass index is 32.76 kg/(m^2). General: Well developed, well nourished, in no acute distress. Head: Normocephalic, atraumatic, sclera non-icteric, no xanthomas, nares are without discharge. Neck: Negative for carotid bruits. JVP not elevated. Lungs: Decreased bilaterally without wheezes, rales, or rhonchi. Breathing is unlabored. Heart: RRR S1 S2 without murmurs, rubs, or gallops.  Abdomen: Soft, non-tender, non-distended with normoactive bowel sounds. No rebound/guarding. Extremities: No clubbing or cyanosis. No edema. Distal pedal pulses are  2+ and equal bilaterally. Neuro: Alert and oriented X 3. Moves all extremities spontaneously. Psych:  Responds to questions appropriately with a normal affect.   Intake/Output Summary (Last 24 hours) at 01/27/15 1047 Last data filed at 01/27/15 0500  Gross per 24 hour  Intake   3731 ml  Output    900 ml  Net   2831 ml    Inpatient Medications:  . atorvastatin  40 mg Oral Daily  . ciprofloxacin  500 mg Oral Q0600  . diltiazem  60 mg Oral 3 times per day  . sodium chloride  10-40 mL Intracatheter Q12H   Infusions:  . amiodarone 30 mg/hr (01/27/15 0513)    Labs:  Recent Labs  01/25/15 1246 01/26/15 0510 01/27/15 0500  NA 142 141 140  K 3.7 3.3* 3.1*  CL 100* 100* 97*  CO2 27 28 30   GLUCOSE 86 87 97  BUN 34* 41* 26*  CREATININE 7.06* 8.00* 5.61*  CALCIUM 7.7* 7.4* 7.5*  PHOS 5.4* 6.2*  --     Recent Labs  01/25/15 1246 01/26/15 0510  ALBUMIN 2.3* 2.1*    Recent Labs  01/26/15 0510 01/26/15 1745 01/27/15 0500  WBC 8.9  --  11.3*  HGB 6.3* 7.5* 6.9*  HCT 19.4* 23.3* 21.6*  MCV 89.6  --  88.8  PLT 258  --  259   No results for input(s): CKTOTAL, CKMB, TROPONINI in the last 72 hours. Invalid input(s): POCBNP No results for input(s): HGBA1C in the last 72 hours.   Weights: Filed Weights   01/26/15 1049 01/26/15 2109 01/27/15 0448  Weight: 216 lb 7.9 oz (98.2 kg) 209 lb 4.8 oz (94.938 kg) 208 lb 15.9 oz (94.8 kg)     Radiology/Studies:  Dg Chest 2  View  01/26/2015   CLINICAL DATA:  CURRENT PNA, SBO, PT HAS NG TUBE, PT IS ON DIALYSIS AND HAS HX OF PAF AND CHF  EXAM: CHEST  2 VIEW  COMPARISON:  01/18/2015  FINDINGS: Moderate enlargement of the cardiopericardial silhouette. Small left and probable small right pleural effusions.  No mediastinal or hilar masses.  Mild lung base opacity, most likely atelectasis. On the left, pneumonia should be considered likely if there are consistent clinical symptoms.  No pulmonary edema.  No pneumothorax.  Nasogastric tube,  new, passes below the diaphragm into the stomach.  Right internal jugular central venous line has its tip in the upper superior vena cava, which appears retracted superiorly from the previous exam.  IMPRESSION: 1. Stable cardiomegaly. 2. Lung base opacity, left greater than right. There is a small left pleural effusion and a probable small right pleural effusion. Additional lung base opacity is likely atelectasis. Pneumonia in the left is possible P the appearance is similar to the most recent prior exam. 3. No pulmonary edema.  No new lung abnormalities. 4. Right IJ central venous line appears retracted. Tip now projects in the upper superior vena cava. 5. Nasogastric tube passes below the diaphragm into the stomach, new from the prior study.   Electronically Signed   By: Lajean Manes M.D.   On: 01/26/2015 14:41   Dg Abd 1 View  01/26/2015   CLINICAL DATA:  Increased abdominal distension  EXAM: ABDOMEN - 1 VIEW  COMPARISON:  None.  FINDINGS: There is gaseous distention of small bowel and colon. There is no evidence of pneumoperitoneum, portal venous gas or pneumatosis. There are no pathologic calcifications along the expected course of the ureters. There are bilateral ureteral stents. There is an aorto bi-iliac stent graft present. There are embolization coils which are likely within the bilateral internal iliac arteries. 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  IMPRESSION: Gaseous distention of small bowel colon concerning for an ileus.   Electronically Signed   By: Kathreen Devoid   On: 01/26/2015 21:21   Dg Abd 1 View  01/26/2015   CLINICAL DATA:  Small-bowel obstruction.  EXAM: ABDOMEN - 1 VIEW  COMPARISON:  01/25/2015.  FINDINGS: NG tube tip projected over the upper stomach. Bilateral double-J ureteral stents. Aorto iliac stent graft. Hypogastric coils noted. Mild improvement of small bowel distention. Small bowel distention however remains. No free air. Persistent left lower lobe atelectasis  and/or infiltrate with left pleural effusion.  IMPRESSION: 1. NG tube tip projected over the upper stomach. Again advancement of approximately 10 cm is suggested to ensure that the proximal port lies below the GE junction. 2. Persistent but slightly improved small bowel distention. 3. Persistent left lower lobe atelectasis and/or infiltrate with left pleural effusion.  These results will be called to the ordering clinician or representative by the Radiologist Assistant, and communication documented in the PACS or zVision Dashboard.   Electronically Signed   By: Marcello Moores  Register   On: 01/26/2015 07:29   Dg Abd 1 View  01/25/2015   CLINICAL DATA:  Abdominal pain, improving symptoms  EXAM: ABDOMEN - 1 VIEW  COMPARISON:  Supine abdominal film dated Jan 24, 2015  FINDINGS: There has been interval placement of a nasogastric tube. The proximal port lies at or just below the level of the GE junction. There remain loops of mildly distended gas-filled small bowel. The caliber of the colon is normal. There small amount of rectal gas. Bilateral double-J ureteral stents are unchanged in  position. There is an aorto bi-iliac stent graft in place. There are metallic coils in the pelvis likely from previous embolization procedures.  IMPRESSION: There has been slight interval increase in the volume of small bowel gas consistent with a mid to distal small bowel obstruction. Advancement of the nasogastric tube by 10 cm is recommended to assure that the proximal port lies below the GE junction.   Electronically Signed   By: David  Martinique M.D.   On: 01/25/2015 07:53   Dg Abd 1 View  01/24/2015   CLINICAL DATA:  Ileus.  EXAM: ABDOMEN - 1 VIEW  COMPARISON:  01/23/2015.  01/22/2015.  FINDINGS: Bilateral double-J ureteral stents noted. Prior aortoiliac stent graft placement with bilateral hypogastric coils. Surgical clips in the abdomen and pelvis. Dilated loops of small bowel noted. These have slightly improved. Colon is nondistended.  Oral contrast is present in the colon. No free air. Vascular calcification. Left lower lobe atelectasis and/or infiltrate with small left pleural effusion. Cardiomegaly. Diffuse osteopenia. Degenerative changes lumbar spine and both hips.  IMPRESSION: 1. Dilated loops of small bowel noted. Small bowel obstruction cannot be excluded . Slight interim improvement. 2. Bilateral double-J ureteral stents in good anatomic position. 3. Aortoiliac stent graft noted. 4. Left lower lobe atelectasis and/or infiltrate with small left pleural effusion. 5. Cardiomegaly.   Electronically Signed   By: Marcello Moores  Register   On: 01/24/2015 08:27   Dg Abd 1 View  01/23/2015   CLINICAL DATA:  Abdominal pain, nausea, abdominal distension, ileus  EXAM: ABDOMEN - 1 VIEW  COMPARISON:  01/22/2015  FINDINGS: Pelvic embolization coils.  Aortoiliac stenting.  BILATERAL ureteral stents.  Persistent dilatation of small bowel loops question ileus versus small bowel obstruction, with slightly less small bowel gas and on the previous study.  No definite bowel wall thickening.  Bones diffusely demineralized.  IMPRESSION: Slightly decreased small bowel gaseous distention since previous exam, question ileus versus small bowel obstruction.   Electronically Signed   By: Lavonia Dana M.D.   On: 01/23/2015 14:11   Dg Abd 1 View  01/22/2015   CLINICAL DATA:  74 year old male with a history of abdominal pain. Nausea  EXAM: ABDOMEN - 1 VIEW  COMPARISON:  01/21/2015, 01/18/2015  FINDINGS: Two frontal supine images.  Multiple distended small bowel loops, as was seen on the comparison study. Paucity of colonic gas. Small amount of retained enteric contrast within the left colon. The enteric contrast was not present on the comparison plain film study, and has progressed through the colon in the last 24 hours.  No definite evidence of free air.  Diffuse osteopenia.  No acute fracture.  Surgical changes of prior endograft repair of infrarenal abdominal aortic  aneurysm with a Gore excluder endograft. Changes of bilateral hypogastric artery coiling.  Wire-less pressure monitor to the left lateral aspect of the endograft.  Bilateral double-J ureteral stents in place.  IMPRESSION: Persisting dilated small bowel loops, either an ileus or small-bowel obstruction. If there is concern for acute intra abdominal process, CT would be indicated.  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 A wireless intrasac pressure monitor (CardioMEMES device) projects to the left aspect of the graft.  Unchanged position of bilateral double-J ureteral stents.  Signed,  Dulcy Fanny. Earleen Newport, DO  [facility]  [facility]   Electronically Signed   By: Corrie Mckusick D.O.   On: 01/22/2015 09:40   Dg Abd 1 View  01/21/2015  CLINICAL DATA:  Abdominal pain  EXAM: ABDOMEN - 1 VIEW  COMPARISON:  CT abdomen and pelvis January 18, 2015  FINDINGS: There are double-J stents bilaterally. There is also an aorto bi-iliac stent for aneurysm. There is generalized bowel dilatation in a pattern suggesting ileus. No free air is seen on this supine examination. There are no air-fluid levels. There is atelectatic change in the lung bases. There are coils in the pelvis, presumably due to internal iliac artery embolization.  IMPRESSION: Bowel gas pattern most consistent with ileus. No free air seen. Double-J stents present as well as aortoiliac stent. Coils are present in the pelvis as well.   Electronically Signed   By: Lowella Grip III M.D.   On: 01/21/2015 10:12   Ct Chest Wo Contrast  01/26/2015   CLINICAL DATA:  Encephalopathy, shortness of breath  EXAM: CT CHEST WITHOUT CONTRAST  TECHNIQUE: Multidetector CT imaging of the chest was performed following the standard protocol without IV contrast.  COMPARISON:  None.  FINDINGS: The central airways are patent. There are small bilateral pleural effusions, left  greater than right. There is no focal consolidation. There is mild bibasilar atelectasis. There is no pneumothorax.  There are no pathologically enlarged axillary, hilar or mediastinal lymph nodes.  The heart size is normal. There is a moderate pericardial effusion measuring 3.2 cm along the left ventricle. 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 There is multi vessel coronary artery atherosclerosis involving the left main, lad, circumflex and RCA.  Review of bone windows demonstrates no focal lytic or sclerotic lesions.  Limited non-contrast images of the upper abdomen were obtained. There are 2 hypodense nodules arising from the right adrenal gland measuring 14 mm and 18 mm respectively most consistent with adrenal adenomas. There is nodularity of the left adrenal gland. There is a nasogastric tube with the tip in the stomach. Partially visualized is left hydronephrosis.  IMPRESSION: 1. Bilateral small pleural effusions, left greater than right with bibasilar atelectasis. 2. Interval development of a moderate pericardial effusion measuring 3.2 cm along the left lateral ventricle. 3. Multi vessel coronary artery atherosclerosis. 4. Distal aortic arch measures 4.7 cm in diameter. Recommend semi-annual imaging followup by CTA or MRA and referral to cardiothoracic surgery if not already obtained. This recommendation follows 2010 ACCF/AHA/AATS/ACR/ASA/SCA/SCAI/SIR/STS/SVM Guidelines for the Diagnosis and Management of Patients With Thoracic Aortic Disease. Circulation. 2010; 121: e266-e36   Electronically Signed   By: Kathreen Devoid   On: 01/26/2015 21:20   Dg Chest Port 1 View  01/18/2015   CLINICAL DATA:  74 year old male status post central line placement  EXAM: PORTABLE CHEST - 1 VIEW  COMPARISON:  CT scan chest/abdomen/ pelvis obtained earlier today ; most recent prior chest x-ray 07/08/2014  FINDINGS: External defibrillator pad projects over the chest. Interval  placement of a right IJ approach central venous catheter. The catheter is somewhat lateral in position. The tip is in the region of the distal SVC. Increased enlargement of the cardiopericardial silhouette. And atherosclerotic thoracic aorta. Pulmonary vascular congestion. All  IMPRESSION: 1. The tip of the right IJ approach central venous catheter is in the region of the distal SVC although it is slightly more lateral than expected. Recommend clinical correlation to insure that the catheter is within the venous system. 2. No evidence of pneumothorax or new pleural effusion. 3. Cardiomegaly. 4. Pulmonary vascular congestion, low lung volumes and bibasilar atelectasis.   Electronically Signed   By: Jacqulynn Cadet  M.D.   On: 01/18/2015 21:22   Ct Angiography Chest/abd/pelvis ([facility] Hx)  01/18/2015   CLINICAL DATA:  Patient to ED via EMS from home with c.o respiratory distress, atrial fib, and groin pain.  EXAM: CT ANGIOGRAPHY CHEST, ABDOMEN AND PELVIS  TECHNIQUE: Multidetector CT imaging through the chest, abdomen and pelvis was performed using the standard protocol during bolus administration of intravenous contrast. Multiplanar reconstructed images and MIPs were obtained and reviewed to evaluate the vascular anatomy.  CONTRAST:  100 mL of Omnipaque 350 intravenous contrast.  COMPARISON:  None.  FINDINGS: CTA CHEST FINDINGS  Thoracic aorta shows atherosclerotic irregularity along the arch and descending aorta. There is mild dilation of the thoracic aorta most prominently of the descending aorta where it measures 4.8 cm. No aortic dissection. The aortic branch vessels are widely patent. Mild atherosclerotic calcifications are noted the base of the left subclavian artery and along the innominate artery.  The heart is moderately enlarged. There are moderate coronary artery calcifications. No neck base or axillary masses or adenopathy. No mediastinal or hilar masses or adenopathy. There is lower lobe opacity, left  greater than right, as well as dependent left upper lobe opacity, most likely all atelectasis. Pneumonia is not excluded but felt less likely. There is no pulmonary edema. 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 Minimal left pleural effusion. No pneumothorax.  Review of the MIP images confirms the above findings.  CTA ABDOMEN AND PELVIS FINDINGS  There is an excluded infrarenal abdominal aortic aneurysm. Native aneurysm measures 5.4 cm x 4.7 cm. The excluded lumen is widely patent. Stents extend into the external iliac arteries, also widely patent. There is dilation of the common femoral arteries. There is an aneurysm or pseudoaneurysm surrounding the right common femoral artery and proximal superficial femoral and deep femoral arteries, measuring 5.7 cm x 5.3 cm. There is no evidence of aortic rupture.  There is significant narrowing at the origin of the celiac axis, measuring 70%. The superior mesenteric artery is widely patent. Mild plaque is noted on the renal arteries without significant stenosis.  Liver, spleen, gallbladder, pancreas: Unremarkable. There is mild thickening of the adrenal glands suggesting nodular hyperplasia. This is stable. There is significant bilateral hydronephrosis. Bilateral ureteral stents extend from the renal pelves to the bladder. There is bilateral renal cortical thinning. No convincing renal mass. Bladder mildly thick-walled. Is distended.  No discrete enlarged lymph node.  No abnormal fluid collections.  There are scattered colonic diverticula. No diverticulitis. No bowel wall thickening or mesenteric inflammation. There is no bowel dilation to suggest obstruction.  MUSCULOSKELETAL  Bones are demineralized. There are degenerative changes throughout the visualized spine. There are areas of sclerosis in the posterior aspect of the T12 vertebrae with a small focus of sclerosis in the T7 vertebrae. T12 focus of sclerosis is chronic unchanged from the prior abdomen and pelvis  CT. There are no fractures. No osteolytic lesions. There are arthropathic changes of both hip joints.  Review of the MIP images confirms the above findings.  IMPRESSION: 1. No evidence of aortic dissection or of a a rupture of the abdominal aortic aneurysm. 2. No acute vascular conclusion. 3. Dilation of the descending thoracic aorta. 4. Excluded infrarenal abdominal aortic aneurysm. The excluded portion of the aneurysm is widely patent. 5. 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 6. Bilateral significant hydronephrosis with bilateral ureteral stents which appear well-positioned. The degree of hydronephrosis and the ureteral stents are stable from  the prior CT. 7. Mild bladder wall thickening. Consider cystitis in the proper clinical setting. 8. In the chest, there is moderate cardiomegaly and dependent atelectasis, greater on the left. No convincing pneumonia and no pulmonary edema.   Electronically Signed   By: Lajean Manes M.D.   On: 01/18/2015 18:41   US Kidneys ([facility] Hx)  01/19/2015   ADDENDUM REPORT: 01/19/2015 16:52  ADDENDUM: When directly comparing to recent CT, the hydronephrosis in the right kidney is moderately improved, particularly in the interpolar region and lower pole collecting system. The hydronephrosis in the left kidney is mildly improved.  Notably, both kidneys are likely mildly improved when compared to CT dated 06/24/2014, indicating that the overall appearance is chronic.   Electronically Signed   By: Julian Hy M.D.   On: 01/19/2015 16:52   01/19/2015   CLINICAL DATA:  Bilateral hydronephrosis  EXAM: RENAL / URINARY TRACT ULTRASOUND COMPLETE  COMPARISON:  CT abdomen pelvis dated 01/18/2015  FINDINGS: Right Kidney:  Length: 11.7 cm. Moderate hydronephrosis. Indwelling ureteral stent.  Left Kidney:  Length: 11.9 cm. Moderate hydronephrosis. Indwelling ureteral stent.  Bladder:  Decompressed by indwelling Foley  catheter.  IMPRESSION: Moderate hydronephrosis with indwelling ureteral stents bilaterally.  Bladder decompressed by indwelling Foley catheter.  Electronically Signed: By: Julian Hy M.D. On: 01/19/2015 16:23     Assessment and Plan  74 y.o. 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  1. New onset a-fib with RVR and hypotension:  -Converted back to atrial fibrillation at 1pm, possibly during HD on 5/5 -Back in NSR as of 11 PM on 5/5 and has remained in NSR with heart rates in the 70s since -Still not taking pills well, NGT in place, NPO -Continue amiodarone infusion until tolerating po/diet, NG tube is out, and he is having BMs -Holding anticoagulation for now given anemia  2. Pericardial effusion: -Seen on chest CT -Evaluate further via echo today  3. 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 Septic shock/UTI: -On Meropenem and vancomycin per IM  -Off pressors  5. ESRD on HD: -Tues, Thursday, Saturday -Per nephrology. Had HD 5/5, possibly converted to atrial fib during HD -Potassium managed via renal  5. Hypoxia: -2/2 septic shock -Stable   6. SBO: -NG tube in place -Still not tolerating diet  7. Anemia: -Hgb down to 6.9 this morning -Goal hgb 8 to 8.5  8. Dispo: -IM with possible  palliative consult    Signed, Christell Faith, PA-C 01/27/2015 11:21 AM

## 2015-01-27 NOTE — Progress Notes (Deleted)
PHARMACIST - PHYSICIAN COMMUNICATION  CONCERNING: Antibiotic IV to Oral Route Change Policy  RECOMMENDATION: This patient is receiving Ciprofloxacin by the intravenous route.  Based on criteria approved by the Pharmacy and Therapeutics Committee, the antibiotic(s) is/are being converted to the equivalent oral dose form(s).   DESCRIPTION: These criteria include:  Patient being treated for a respiratory tract infection, urinary tract infection, cellulitis or clostridium difficile associated diarrhea if on metronidazole  The patient is not neutropenic and does not exhibit a GI malabsorption state  The patient is eating (either orally or via tube) and/or has been taking other orally administered medications for a least 24 hours  The patient is improving clinically and has a Tmax < 100.5  If you have questions about this conversion, please contact the Pharmacy Department  []   [phone] )  [facility] [x]   [phone] )  [facility] []   [phone] )  [facility] []   [phone] )  [facility] []   [phone] )  [place], PharmD Clinical Pharmacist 01/27/2015

## 2015-01-27 NOTE — Progress Notes (Signed)
SUBJECTIVE:  Review of Systems:  Subjective/Chief Complaint Admitted fro encephalopathy and SOB. Found to have Afib with RVR and hypotension in ED. Septic shock due to UTI. now he is awake and mental status is improved. Off levophed and amiodarone. Cloudy urine with some blood clots. No complaints of pain or SOB. Had multiple vomits - noted ileus, so NGT placed 01/22/15.  Still no BM or passing gas. On 01/26/15 converted back to A fib with RVR. have NGT suction. Re-started on amiodarone drip, Hb dropped- transfused one unit PRBC 01/26/15.   Fever/Chills No   Cough No   Sputum No   Abdominal Pain No   Diarrhea No   Constipation No   Nausea/Vomiting no   SOB/DOE No   Chest Pain No   Telemetry Reviewed Afib   Dysuria No   Medications/Allergies Reviewed Medications/Allergies reviewed   VITAL SIGNS/ANCILLARY NOTES: **Vital Signs.: Filed Vitals:   01/27/15 1236  BP: 115/59  Pulse: 73  Temp: 98.1 F (36.7 C)  Resp: 20    Physical Exam:  GEN No distress.   HEENT pale conjunctivae, hearing intact to voice, dry oral mucosa,NGT in place.   NECK supple  No masses   RESP normal resp effort  clear BS, decreased sound on left side.   CARD irregular rate  No LE edema, tachycardia.   ABD denies tenderness , distended, soft  sluggish BS.   GU foley catheter in place  cloudy urine   EXTR negative cyanosis/clubbing, negative edema   SKIN No rashes, No ulcers   NEURO negative rigidity, negative tremor, follows commands   PSYCH alert, A+O to time, place, person   ASSESSMENT/PLAN:  Assessment/Plan:  Orders/Results reviewed Orders reviewed and updated, Laboratory results reviewed, Radiology results reviewed, Vital Signs reviewed, Nursing documentation reviewed   Assessment/Plan 76 m with Bladder/prostate cancer, ESRD on HD, hypertension here with confusion, SOB   * Afib with RVR due to sepsis was On amiodarone drip. Appreciate cardiology help- swiched to oral  amiodarone.   As per cardiology eventually need Cath- no urgency.  Not able to take meds much due to Ileus, but HR was stable till 01/25/15, converted to A fib with RVR 01/26/15- called Cardiology again. Restarted on amiodarone drip.  * Ileus   NPO for now, NGT suction since 01/22/15. Still no BM or passing gas. Given dulcolax suppository.   Monitor. Encourage PT.   Repeat Xray- was showing less distension  But now it is more distended, called surgical consult. Agreed with conservative plan.  As no oral intake for 6 days- will get Dietary involved to start TPN.  * Septic shock due to UTI   Had ureteral stents- seen by urology and ID.   Was On Levophed for Map >65- improved-     BP   stable.  Initially broad spectrum abx. Per ID tapered to cipro. Appreciated ID consult.   repeated cx just 2000CFU gr neg rods.  * ESRD on HD   manage per nephrology  * Anemia of Chronic Disease with worsening  Transfused 1 unit PRBC 01/26/15   Target Hb should be around 8.    Again drop in Hb, transfuse again, and will check Gastric  secretions for blood loss in there.  * pericardial effusion   Cardio on case- will get Echo.  * Acute resp failure on admission  Aspiration?  Monitor.   Now on room air.  Overall prognosis is very poor due to multiple medical issues  Spoek to Pt and his POA-  [name]- they agreed to meet palliative care team to help decide.    Discharge Planning SNF/LTC   Code Status Full Code   Critical Care no, Total patient care time:, 55 minutes   Case Discussed With patient, family, nursing, Dr.phifer

## 2015-01-27 NOTE — Progress Notes (Signed)
Occupational Therapy Treatment Patient Details Name: [name] MRN: [id number] DOB: 01-29-1941 Today's Date: 01/27/2015    History of present illness 74 yo male with onset of septic shock and respriatory distress, hypotension and atelectasis, recent bladder history, PMHx:  a-fib with RVR, prostate CA, ESRD T TH S, neurogenic bladder, AAA, anemia,    OT comments  Pt seen for light grooming skills prior to getting up to chair per pt request.  Pt needs minimal assist and cues for sequencing and set up for grooming skills and mod assist for bed mobiltiy for supine to sitting EOB with assist for legs.  Pt having difficulty achieving full standing position so a lateral transfer of max assist of 2 to get to chair completed.  Pt was fatigued after getting to chair and Dr Phifer arrived to talk to patient.  Pt's insurance denied CIR so plan is for him to go to [facility]SNF when medically ready.  He currently has NG tube and no bowel sounds.  Follow Up Recommendations       Equipment Recommendations       Recommendations for Other Services      Precautions / Restrictions Precautions Precautions: Fall;Other (comment) (sacral decubitus ulcer so prevent sliding during transfers) Precaution Comments: max assist of 2 transfer to chair stand pivot but pt has difficulty standing up so transfer is assisted in incomplete stance       Mobility Bed Mobility Overal bed mobility: Needs Assistance Bed Mobility: Supine to Sit;Rolling Rolling: Mod assist   Supine to sit: Max assist     General bed mobility comments: Pt has some difficulty with directing upper body toward pillow but can assist wtih legs.  Transfers Overall transfer level: Needs assistance Equipment used: 2 person hand held assist Transfers: Lateral/Scoot Geophysicist/field seismologist Transfers Sit to Stand: +2 physical assistance;Max assist   Squat pivot transfers: +2 physical assistance;Max assist    Lateral/Scoot Transfers:  Max assist;+2 physical assistance;+2 safety/equipment General transfer comment: able to support on LE's a bit to help the pivot to chair, sliding on chair and bed and then the squat pivot as well    Balance                                   ADL Overall ADL's : Needs assistance/impaired     Grooming: Wash/dry hands;Wash/dry face;Brushing hair;Minimal assistance;Cueing for sequencing;Sitting               Lower Body Dressing: Maximal assistance Lower Body Dressing Details (indicate cue type and reason): pt fatigued after transfer to chair and not able to participate in LE dressing this session, only light grooming skills                       Vision                     Perception     Praxis      Cognition   Behavior During Therapy: [facility] for tasks assessed/performed Overall Cognitive Status: Within Functional Limits for tasks assessed                       Extremity/Trunk Assessment               Exercises     Shoulder Instructions       General Comments  Pertinent Vitals/ Pain          Home Living                                          Prior Functioning/Environment              Frequency Min 1X/week     Progress Toward Goals  OT Goals(current goals can now be found in the care plan section)  Progress towards OT goals: Progressing toward goals  Acute Rehab OT Goals Patient Stated Goal: I want to sit up in chair again  Plan Discharge plan needs to be updated;Other (comment) (pt denied CIR and to go to [facility])    Co-evaluation                 End of Session     Activity Tolerance Patient limited by fatigue   Patient Left in chair;with call bell/phone within reach;with chair alarm set;Other (comment) (Dr Phifer in room with pateint after getting into chair)   Nurse Communication Other (comment) (Tammy from nsg informed that pt was up in chair with alarm on  since his nurse was at lunch)        Time: 1500-1540 OT Time Calculation (min): 40 min  Charges: OT General Charges $OT Visit: 1 Procedure OT Treatments $Self Care/Home Management : 8-22 mins $Therapeutic Activity: 23-37 mins  Wofford,Susan 01/27/2015, 3:52 PM    Chrys Racer, OTR/L

## 2015-01-27 NOTE — Consult Note (Signed)
Palliative Medicine Inpatient Consult Note   Name: [name] Date: 01/27/2015 MRN: [id number]  DOB: [birth year]  Referring Physician: Vaughan Basta, MD  Palliative Care consult requested for this 74 y.o. male for goals of medical therapy in patient with ESRD, bladder cancer, admitted with sepsis, a.fib with RVR  [name] is a 74 yo man with PMH of ESRD on dialysis, bladder cancer, chronic obstructive nephropathy s/p B.ureteral stents, recurrent UTI's, prostate cancer, a.fib, AAA repair, DM, HTN, ongoing tobacco use. He was admitted 01/18/15 with sepsis from UTI and a.fib with RVR. Hospital course complicated by acute blood loss anemia, ileus requiring NGT, a.fib with RVR on amiodorone drip. ECHO with EF 25-30%. At present, pt is working with PT getting OOB and into chair at pt's request. Denies pain. Family not present.   REVIEW OF SYSTEMS:  Pain: None Dyspnea:  exertional Nausea/Vomiting:  NGT in place Diarrhea:  No Constipation:   No Depression:   No Anxiety:   No Fatigue:   Yes  SOCIAL HISTORY: Pt is married but has been separated from his wife for 30+ years. He has a daughter who lives locally and is involved in his care. He has a son who is incarcerated. Pt lives at home. He has a roommate who assists pt in his care. Pt worked as a Administrator.   reports that he quit smoking about 2 weeks ago. He does not have any smokeless tobacco history on file. He reports that he does not drink alcohol or use illicit drugs.  LEGAL DOCUMENTS:  Advance Directives:  Yes. Health Care Power of Attorney:  Yes. [name]  CODE STATUS: Full code  PAST MEDICAL HISTORY: Past Medical History  Diagnosis Date  . PAF (paroxysmal atrial fibrillation)     a. new onset 12/2014 in the setting of UTI, sepsis, hypotension, and anemia; b. not on long term anticoagulation given anemia; c. family aware of stroke risk, they are ok with this; d. on amiodarone   . Chronic combined systolic and  diastolic CHF (congestive heart failure)     a. echo 12/2014: EF 25-30%, anterior wall wall motion abnormalities; b. planned ischemic evaluation once patient is stable medically  . ESRD on hemodialysis     a. Tuesday, Thursday, and Saturdays  . Anemia     a. baseline hgb ~ 8  . History of small bowel obstruction     a. 01/2015    PAST SURGICAL HISTORY: History reviewed. No pertinent past surgical history.  ALLERGIES:  is allergic to penicillins.  MEDICATIONS:  Current Facility-Administered Medications  Medication Dose Route Frequency Provider Last Rate Last Dose  . acetaminophen (TYLENOL) tablet 650 mg  650 mg Oral Q4H PRN Epifanio Lesches, MD      . amiodarone (NEXTERONE PREMIX) 360 MG/200ML (1.8 mg/mL) IV infusion  30 mg/hr Intravenous Continuous Minna Merritts, MD 16.7 mL/hr at 01/27/15 0513 30 mg/hr at 01/27/15 0513  . atorvastatin (LIPITOR) tablet 40 mg  40 mg Oral Daily Doctor Chlconversion, MD   40 mg at 01/27/15 1032  . ciprofloxacin (CIPRO) IVPB 400 mg  400 mg Intravenous Q24H Epifanio Lesches, MD      . diltiazem (CARDIZEM) tablet 60 mg  60 mg Oral 3 times per day Vaughan Basta, MD   60 mg at 01/27/15 1415  . insulin aspart (novoLOG) injection 0-9 Units  0-9 Units Subcutaneous Q6H Vaughan Basta, MD      . ondansetron (ZOFRAN) injection 4 mg  4 mg Intravenous Q4H  PRN Doctor Chlconversion, MD      . phenol (CHLORASEPTIC) mouth spray 1 spray  1 spray Mouth/Throat Q2H PRN Doctor Chlconversion, MD   1 spray at 01/27/15 1032  . sodium chloride 0.9 % injection 10-40 mL  10-40 mL Intracatheter Q12H Vaughan Basta, MD   10 mL at 01/27/15 1049  . sodium chloride 0.9 % injection 10-40 mL  10-40 mL Intracatheter PRN Vaughan Basta, MD   10 mL at 01/26/15 0949  . sodium chloride 0.9 % injection 3-6 mL  3-6 mL Intravenous PRN Doctor Chlconversion, MD      . TPN [facility]) Adult without lytes   Intravenous Continuous TPN Vaughan Basta, MD         Vital Signs: BP 132/69 mmHg  Pulse 79  Temp(Src) 98.1 F (36.7 C) (Oral)  Resp 18  Ht 5' 6.97" (1.701 m)  Wt 94.8 kg (208 lb 15.9 oz)  BMI 32.76 kg/m2  SpO2 99% Filed Weights   01/26/15 1049 01/26/15 2109 01/27/15 0448  Weight: 98.2 kg (216 lb 7.9 oz) 94.938 kg (209 lb 4.8 oz) 94.8 kg (208 lb 15.9 oz)    Estimated body mass index is 32.76 kg/(m^2) as calculated from the following:   Height as of this encounter: 5' 6.97" (1.701 m).   Weight as of this encounter: 94.8 kg (208 lb 15.9 oz).  PERFORMANCE STATUS (ECOG) : 3 - Symptomatic, >50% confined to bed  PHYSICAL EXAM:  General: ill-appearing HEENT: NG in place Neck: Trachea midline  Cardiovascular: irregular rate and rhythm, tachycardic Pulmonary/Chest: coarse BS's ant fields Abdominal: Soft, NTTP, bowel sounds absent GU: No SP tenderness Extremities: No edema  Neurological: moves extremities, follows commands, 2 person assist to chair Skin: 2 sacral pressure ulcers Psychiatric:  A&O x 3   LABS: CBC:  Recent Labs Lab 01/26/15 0510 01/26/15 1745 01/27/15 0500  WBC 8.9  --  11.3*  HGB 6.3* 7.5* 6.9*  HCT 19.4* 23.3* 21.6*  PLT 258  --  259   Comprehensive Metabolic Panel:  Recent Labs Lab 01/24/15 0940  01/26/15 0510 01/27/15 0500  NA  --   < > 141 140  K  --   < > 3.3* 3.1*  CL  --   < > 100* 97*  CO2  --   < > 28 30  GLUCOSE  --   < > 87 97  BUN  --   < > 41* 26*  CREATININE  --   < > 8.00* 5.61*  CALCIUM  --   < > 7.4* 7.5*  MG 2.2  --   --   --   < > = values in this interval not displayed.  IMPRESSION:  [name] is a 74 yo man with PMH of ESRD on dialysis, bladder cancer, chronic obstructive nephropathy s/p B.ureteral stents, recurrent UTI's, prostate cancer, a.fib, AAA repair, DM, HTN, ongoing tobacco use. He was admitted 01/18/15 with sepsis from UTI and a.fib with RVR. Hospital course complicated by acute blood loss anemia, ileus requiring NGT, a.fib with RVR on amiodorone drip. ECHO with  EF 25-30%.   I spoke with pt. He confirms that [name] is his HCPOA. We discussed his Advance Directive that is in SCM. He says that he would want intubation short term but would not want to be on vent long term. He would like an attempt at cardiac resuscitation.   Pt plans to go to SNF for STR at d/c with ultimate goal of returning home. Pt says that  he needs assistance at home with all ADL's but his roommate helps him. Pt remains hopeful that he will recover from this acute illness and be able to return home.   Pt says that HCPOA/Ida Cousin is at a funeral this afternoon and not available. I will follow up on Monday.    PLAN: As above  More than 50% of the visit was spent in counseling/coordination of care: YES  Time spent: 70 minutes

## 2015-01-27 NOTE — Progress Notes (Signed)
Nutrition Follow-up  DOCUMENTATION CODES:   INTERVENTION: Parenteral Nutrition: Start TPN per MD at 40 ml/ hr. Discussed with MD Anselm Jungling and MD Kolluru re: TPN and they are agreeable to start. TPN of 5% Amino Acids/15% Dextrose Clinimix without Electrolytes at goal rate of 80 ml/ hr will provide 1648 kcal. 96g Pro, and 1920 ml total volume in 24 hrs.  NUTRITION DIAGNOSIS:  Inadequate oral intake related to altered GI function as evidenced by NPO status, percent weight loss, energy intake < or equal to 50% for > or equal to 5 days, presence of pressure ulcers.  GOAL: Energy Intake: Provide needs based on ASPEN/SCCM guidelines, Patient will meet greater than or equal to 90% of their needs, tolerate TPN at goal rate within 24 hrs.  MONITOR:  Diet advancement, Labs, Weight trends, I & O's  REASON FOR ASSESSMENT:  Consult New TPN/TNA  ASSESSMENT: Clinical Update: NGT in place, + ileus, NPO/ CL day 5; significant weight loss during admission Typical Food/ Fluid Intake: pt NPO Weight Changes: 7.1% weight loss x 1 week. Labs: Electrolyte and Renal Profile:    Recent Labs Lab 01/23/15 1445 01/24/15 0940 01/25/15 1246 01/26/15 0510 01/27/15 0500  BUN 56*  --  34* 41* 26*  CREATININE 9.87*  --  7.06* 8.00* 5.61*  NA 143  --  142 141 140  K 4.1  --  3.7 3.3* 3.1*  MG  --  2.2  --   --   --   PHOS 7.0*  --  5.4* 6.2*  --    Protein Profile:  Recent Labs Lab 01/23/15 1445 01/25/15 1246 01/26/15 0510  ALBUMIN 2.1* 2.3* 2.1*    Meds: Lipitor,  Physical Findings: NGT to LIS Skin: Stage II pressure ulcer on sacrum  Height:  Ht Readings from Last 1 Encounters:  01/20/15 5' 6.97" (1.701 m)    Weight:  Wt Readings from Last 1 Encounters:  01/27/15 208 lb 15.9 oz (94.8 kg)    Ideal Body Weight:   Wt Readings from Last 10 Encounters:  01/27/15 208 lb 15.9 oz (94.8 kg)    BMI:  Body mass index is 32.76 kg/(m^2).  Estimated Nutritional Needs:  Kcal:  0630-1601  kcal/ day (BEE: 1373 x 1.2 AF x 1.0-1.2 IF)- using IBW of 67 kg  Protein:  80-101 g Pro/day (1.2-1.5 g Pro/ kg/ day)- using IBW of 67 kg  Fluid:  1012ml + UOP  Skin:  Wound (see comment) (Stage II Sacrum)  Diet Order:  TPN (CLINIMIX) Adult without lytes  EDUCATION NEEDS:  No education needs identified at this time   Intake/Output Summary (Last 24 hours) at 01/27/15 1401 Last data filed at 01/27/15 1049  Gross per 24 hour  Intake   3456 ml  Output    900 ml  Net   2556 ml    Last BM:  5/4, very small  Roda Shutters, RDN Pager: [phone] Office: [place]

## 2015-01-27 NOTE — Progress Notes (Signed)
Notified Dr. Marcille Blanco of H&H of 6.9 and 21.6. No new orders

## 2015-01-27 NOTE — Progress Notes (Signed)
A&O. Transferred from [place].  Very weak. No complaints of pain. NGT to LWIS. Foley in place. Amiodorone drip initiated. No s/s distress. Heart rate remained in the 70s and 80s. Systolic blood pressure in the low 100s and 90s at times.

## 2015-01-27 NOTE — Progress Notes (Signed)
Date of Admission:  01/18/2015   ID: [name] is a 74 y.o. male with   Active Problems:   Atrial fibrillation with RVR   Chronic combined systolic and diastolic CHF (congestive heart failure)   ESRD on hemodialysis   Anemia   History of small bowel obstruction    Subjective: Sitting up in bed, still with NGT tube in place.  Foley in place  Medications:  . atorvastatin  40 mg Oral Daily  . ciprofloxacin  400 mg Intravenous Q24H  . diltiazem  60 mg Oral 3 times per day  . insulin aspart  0-9 Units Subcutaneous Q6H  . sodium chloride  10-40 mL Intracatheter Q12H    Objective: Vital signs in last 24 hours: Temp:  [97.9 F (36.6 C)-98.3 F (36.8 C)] 98.1 F (36.7 C) (05/06 1441) Pulse Rate:  [73-125] 79 (05/06 1441) Resp:  [18-20] 18 (05/06 1441) BP: (95-132)/(53-69) 132/69 mmHg (05/06 1441) SpO2:  [98 %-100 %] 99 % (05/06 1441) Weight:  [94.8 kg (208 lb 15.9 oz)-94.938 kg (209 lb 4.8 oz)] 94.8 kg (208 lb 15.9 oz) (05/06 0448)  NGT in place, foley in place General appearance: alert Head: Normocephalic, without obvious abnormality, atraumatic Eyes: conjunctivae/corneas clear. PERRL, EOM's intact.  Ears: normal TM's and external ear canals both ears Resp: clear to auscultation bilaterally Chest wall: no tenderness GI: soft, non-tender; bowel sounds normal; no masses, no organomegaly Extremities: extremities normal, atraumatic, no cyanosis or edema Skin: Skin color, texture, turgor normal. No rashes or lesions GU foley in place, clear urine  Lab Results  Recent Labs  01/26/15 0510 01/26/15 1745 01/27/15 0500  WBC 8.9  --  11.3*  HGB 6.3* 7.5* 6.9*  HCT 19.4* 23.3* 21.6*  NA 141  --  140  K 3.3*  --  3.1*  CL 100*  --  97*  CO2 28  --  30  BUN 41*  --  26*  CREATININE 8.00*  --  5.61*   Liver Panel  Recent Labs  01/25/15 1246 01/26/15 0510  ALBUMIN 2.3* 2.1*    Microbiology:  Studies/Results: Dg Chest 2 View  01/26/2015   CLINICAL DATA:   CURRENT PNA, SBO, PT HAS NG TUBE, PT IS ON DIALYSIS AND HAS HX OF PAF AND CHF  EXAM: CHEST  2 VIEW  COMPARISON:  01/18/2015  FINDINGS: Moderate enlargement of the cardiopericardial silhouette. Small left and probable small right pleural effusions.  No mediastinal or hilar masses.  Mild lung base opacity, most likely atelectasis. On the left, pneumonia should be considered likely if there are consistent clinical symptoms.  No pulmonary edema.  No pneumothorax.  Nasogastric tube, new, passes below the diaphragm into the stomach.  Right internal jugular central venous line has its tip in the upper superior vena cava, which appears retracted superiorly from the previous exam.  IMPRESSION: 1. Stable cardiomegaly. 2. Lung base opacity, left greater than right. There is a small left pleural effusion and a probable small right pleural effusion. Additional lung base opacity is likely atelectasis. Pneumonia in the left is possible P the appearance is similar to the most recent prior exam. 3. No pulmonary edema.  No new lung abnormalities. 4. Right IJ central venous line appears retracted. Tip now projects in the upper superior vena cava. 5. Nasogastric tube passes below the diaphragm into the stomach, new from the prior study.   Electronically Signed   By: Lajean Manes M.D.   On: 01/26/2015 14:41   Dg Abd 1  View  01/27/2015   CLINICAL DATA:  Ileus, abdominal pain  EXAM: ABDOMEN - 1 VIEW  COMPARISON:  01/26/2015  FINDINGS: Tip of nasogastric tube projects over gastric antrum.  Prior endoluminal stenting of abdominal aorta and iliac arteries.  BILATERAL ureteral stents present.  Dilatation of small bowel loops without bowel wall thickening.  Scattered gas within nondistended colon and rectum.  Bones diffusely demineralized.  Atelectasis versus consolidation LEFT lower lobe.  IMPRESSION: Diffuse small bowel dilatation, question ileus though obstruction not excluded ; radiographic followup as clinically indicated.  Atelectasis  versus consolidation LEFT lower lobe.   Electronically Signed   By: Lavonia Dana M.D.   On: 01/27/2015 11:10   Dg Abd 1 View  01/26/2015   CLINICAL DATA:  Increased abdominal distension  EXAM: ABDOMEN - 1 VIEW  COMPARISON:  None.  FINDINGS: There is gaseous distention of small bowel and colon. There is no evidence of pneumoperitoneum, portal venous gas or pneumatosis. There are no pathologic calcifications along the expected course of the ureters. There are bilateral ureteral stents. There is an aorto bi-iliac stent graft present. There are embolization coils which are likely within the bilateral internal iliac arteries. 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  IMPRESSION: Gaseous distention of small bowel colon concerning for an ileus.   Electronically Signed   By: Kathreen Devoid   On: 01/26/2015 21:21   Dg Abd 1 View  01/26/2015   CLINICAL DATA:  Small-bowel obstruction.  EXAM: ABDOMEN - 1 VIEW  COMPARISON:  01/25/2015.  FINDINGS: NG tube tip projected over the upper stomach. Bilateral double-J ureteral stents. Aorto iliac stent graft. Hypogastric coils noted. Mild improvement of small bowel distention. Small bowel distention however remains. No free air. Persistent left lower lobe atelectasis and/or infiltrate with left pleural effusion.  IMPRESSION: 1. NG tube tip projected over the upper stomach. Again advancement of approximately 10 cm is suggested to ensure that the proximal port lies below the GE junction. 2. Persistent but slightly improved small bowel distention. 3. Persistent left lower lobe atelectasis and/or infiltrate with left pleural effusion.  These results will be called to the ordering clinician or representative by the Radiologist Assistant, and communication documented in the PACS or zVision Dashboard.   Electronically Signed   By: Marcello Moores  Register   On: 01/26/2015 07:29   Ct Chest Wo Contrast  01/26/2015   CLINICAL DATA:  Encephalopathy, shortness of breath  EXAM: CT CHEST  WITHOUT CONTRAST  TECHNIQUE: Multidetector CT imaging of the chest was performed following the standard protocol without IV contrast.  COMPARISON:  None.  FINDINGS: The central airways are patent. There are small bilateral pleural effusions, left greater than right. There is no focal consolidation. There is mild bibasilar atelectasis. There is no pneumothorax.  There are no pathologically enlarged axillary, hilar or mediastinal lymph nodes.  The heart size is normal. There is a moderate pericardial effusion measuring 3.2 cm along the left ventricle. 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 There is multi vessel coronary artery atherosclerosis involving the left main, lad, circumflex and RCA.  Review of bone windows demonstrates no focal lytic or sclerotic lesions.  Limited non-contrast images of the upper abdomen were obtained. There are 2 hypodense nodules arising from the right adrenal gland measuring 14 mm and 18 mm respectively most consistent with adrenal adenomas. There is nodularity of the left adrenal gland. There is a nasogastric tube with the tip in the stomach. Partially visualized  is left hydronephrosis.  IMPRESSION: 1. Bilateral small pleural effusions, left greater than right with bibasilar atelectasis. 2. Interval development of a moderate pericardial effusion measuring 3.2 cm along the left lateral ventricle. 3. Multi vessel coronary artery atherosclerosis. 4. Distal aortic arch measures 4.7 cm in diameter. Recommend semi-annual imaging followup by CTA or MRA and referral to cardiothoracic surgery if not already obtained. This recommendation follows 2010 ACCF/AHA/AATS/ACR/ASA/SCA/SCAI/SIR/STS/SVM Guidelines for the Diagnosis and Management of Patients With Thoracic Aortic Disease. Circulation. 2010; 121: e266-e36   Electronically Signed   By: Kathreen Devoid   On: 01/26/2015 21:20     Assessment/Plan: 74 year old gentleman with multiple medical  problems, on dialysis, chronic obstructive nephropathy, as well as bilateral urethral stents in place. He has recurrent history of multiple urinary tract infections in the past with Enterobacter. He also had viridans strep bacteremia in October. He was admiited with urosepsis with quite purulent urine in his Foley catheter. He clinically improved on meropenem and vancomycin. Off pressors. Ucx grew only enterobacter.  Has been seen by urology who rec leave foley in and that stents may be able to be removed in future.  RECOMMENDATIONS: Given enterobacter sensis will change cipro to bactrim Rec stop date 5/16. Agree with urological evaluation. - will likely need stent removal given current infection Will need close follow up after stopping abx and will need stents out asap  [name], Sorrel Cassetta   01/27/2015, 3:56 PM

## 2015-01-28 DIAGNOSIS — I4891 Unspecified atrial fibrillation: Secondary | ICD-10-CM

## 2015-01-28 LAB — CBC
HCT: 23.1 % — ABNORMAL LOW (ref 40.0–52.0)
HEMOGLOBIN: 7.7 g/dL — AB (ref 13.0–18.0)
MCH: 29.2 pg (ref 26.0–34.0)
MCHC: 33.3 g/dL (ref 32.0–36.0)
MCV: 87.8 fL (ref 80.0–100.0)
Platelets: 258 10*3/uL (ref 150–440)
RBC: 2.63 MIL/uL — ABNORMAL LOW (ref 4.40–5.90)
RDW: 17.9 % — ABNORMAL HIGH (ref 11.5–14.5)
WBC: 12.6 10*3/uL — ABNORMAL HIGH (ref 3.8–10.6)

## 2015-01-28 LAB — GLUCOSE, CAPILLARY
GLUCOSE-CAPILLARY: 135 mg/dL — AB (ref 70–99)
GLUCOSE-CAPILLARY: 142 mg/dL — AB (ref 70–99)
Glucose-Capillary: 134 mg/dL — ABNORMAL HIGH (ref 70–99)
Glucose-Capillary: 138 mg/dL — ABNORMAL HIGH (ref 70–99)
Glucose-Capillary: 111 mg/dL — ABNORMAL HIGH (ref 70–99)
Glucose-Capillary: 125 mg/dL — ABNORMAL HIGH (ref 70–99)

## 2015-01-28 LAB — BASIC METABOLIC PANEL
ANION GAP: 10 (ref 5–15)
BUN: 34 mg/dL — ABNORMAL HIGH (ref 6–20)
CHLORIDE: 98 mmol/L — AB (ref 101–111)
CO2: 31 mmol/L (ref 22–32)
Calcium: 7.5 mg/dL — ABNORMAL LOW (ref 8.9–10.3)
Creatinine, Ser: 6.72 mg/dL — ABNORMAL HIGH (ref 0.61–1.24)
GFR calc non Af Amer: 7 mL/min — ABNORMAL LOW (ref >60)
GFR, EST AFRICAN AMERICAN: 8 mL/min — AB (ref >60)
Glucose, Bld: 118 mg/dL — ABNORMAL HIGH (ref 65–99)
POTASSIUM: 2.8 mmol/L — AB (ref 3.5–5.1)
SODIUM: 139 mmol/L (ref 135–145)

## 2015-01-28 LAB — TRIGLYCERIDES: Triglycerides: 72 mg/dL (ref <150)

## 2015-01-28 LAB — MAGNESIUM: Magnesium: 1.8 mg/dL (ref 1.7–2.4)

## 2015-01-28 LAB — PHOSPHORUS: Phosphorus: 4.2 mg/dL (ref 2.5–4.6)

## 2015-01-28 LAB — PREALBUMIN: Prealbumin: 7.1 mg/dL — ABNORMAL LOW (ref 18–38)

## 2015-01-28 MED ORDER — PANTOPRAZOLE SODIUM 40 MG IV SOLR
40.0000 mg | INTRAVENOUS | Status: DC
  Administered 2015-01-28 – 2015-02-02 (×6): 40 mg via INTRAVENOUS
  Filled 2015-01-28 (×6): qty 40

## 2015-01-28 MED ORDER — TRACE MINERALS CR-CU-MN-SE-ZN 10-1000-500-60 MCG/ML IV SOLN
INTRAVENOUS | Status: AC
  Administered 2015-01-28: 18:00:00 via INTRAVENOUS
  Filled 2015-01-28: qty 2000

## 2015-01-28 MED ORDER — POTASSIUM CHLORIDE 10 MEQ/100ML IV SOLN
10.0000 meq | INTRAVENOUS | Status: AC
  Administered 2015-01-28 (×4): 10 meq via INTRAVENOUS
  Filled 2015-01-28 (×4): qty 100

## 2015-01-28 MED ORDER — TRACE MINERALS CR-CU-MN-SE-ZN 10-1000-500-60 MCG/ML IV SOLN
INTRAVENOUS | Status: DC
  Filled 2015-01-28: qty 960

## 2015-01-28 MED ORDER — SODIUM CHLORIDE 0.9 % IV SOLN: 100.0000 mL | INTRAVENOUS | Status: DC | PRN

## 2015-01-28 MED ORDER — POTASSIUM CHLORIDE 20 MEQ/15ML (10%) PO SOLN: 40.0000 meq | Freq: Two times a day (BID) | ORAL | Status: DC

## 2015-01-28 MED ORDER — SODIUM CHLORIDE 0.45 % IV SOLN
Freq: Once | INTRAVENOUS | Status: AC
  Administered 2015-01-28: 11:00:00 via INTRAVENOUS
  Filled 2015-01-28: qty 500

## 2015-01-28 NOTE — Progress Notes (Signed)
This note also relates to the following rows which could not be included: Pulse Rate - Cannot attach notes to unvalidated device data Resp - Cannot attach notes to unvalidated device data BP - Cannot attach notes to unvalidated device data SpO2 - Cannot attach notes to unvalidated device data   Post hd tx 

## 2015-01-28 NOTE — Progress Notes (Signed)
Nutrition Follow-up  INTERVENTION:  Parenteral Nutrition: Continue TPN per MD Koluru at 40 ml/ hr through the weekend. Discussed with MD Kolluru this am does not want to increase to goal at this time. Current rate providing 48g protein (60% of estimated protein needs) and 682kcals (42% of estimated energy needs) in 24 hours.  NUTRITION DIAGNOSIS:  Inadequate oral intake related to altered GI function as evidenced by NPO status, percent weight loss, energy intake < or equal to 50% for > or equal to 5 days, being addressed with TPN  GOAL:  Provide needs based on ASPEN/SCCM guidelines, Patient will meet greater than or equal to 90% of their needs  MONITOR:  Diet advancement, Labs, Weight trends, I & O's  REASON FOR ASSESSMENT:  Consult New TPN/TNA  ASSESSMENT:  Pt in HD this am.   Medications:reviewed Labs: Electrolyte and Renal Profile:    Recent Labs Lab 01/24/15 0940 01/25/15 1246 01/26/15 0510 01/27/15 0500 01/28/15 0454  BUN  --  34* 41* 26* 34*  CREATININE  --  7.06* 8.00* 5.61* 6.72*  NA  --  142 141 140 139  K  --  3.7 3.3* 3.1* 2.8*  MG 2.2  --   --  1.8 1.8  PHOS  --  5.4* 6.2*  --  4.2   Glucose Profile:  Recent Labs  01/27/15 2024 01/28/15 0225 01/28/15 0733  GLUCAP 134* 138* 135*   Protein Profile:  Recent Labs Lab 01/23/15 1445 01/25/15 1246 01/26/15 0510  ALBUMIN 2.1* 2.3* 2.1*    Height:  Ht Readings from Last 1 Encounters:  01/20/15 5' 6.97" (1.701 m)    Weight:  Wt Readings from Last 1 Encounters:  01/28/15 220 lb 7.4 oz (100 kg)    Ideal Body Weight:     Wt Readings from Last 10 Encounters:  01/28/15 220 lb 7.4 oz (100 kg)    BMI:  Body mass index is 34.56 kg/(m^2).  Estimated Nutritional Needs:  Kcal:  2500-3704 kcal/ day (BEE: 1373 x 1.2 AF x 1.0-1.2 IF)- using IBW of 67 kg  Protein:  80-101 g Pro/day (1.2-1.5 g Pro/ kg/ day)- using IBW of 67 kg  Fluid:  1068ml + UOP  Skin:  Wound (see comment) (Stage II  Sacrum)  Diet Order:  TPN (CLINIMIX) Adult without lytes TPN (CLINIMIX) Adult without lytes  EDUCATION NEEDS:  No education needs identified at this time   Intake/Output Summary (Last 24 hours) at 01/28/15 1212 Last data filed at 01/28/15 1100  Gross per 24 hour  Intake    288 ml  Output    775 ml  Net   -487 ml    Last BM:  5/5  HIGH Care Level  Dwyane Luo, RD, LDN Pager [phone]

## 2015-01-28 NOTE — Progress Notes (Signed)
[facility]  ROUNDING NOTE   Subjective:   Seen on hemodialysis. Tolerating treatment well. UF of 0.  TPN started yesterday.  Status post 2 units PRBC in last two days. Now hemoglobin 7.7 Objective:  Vital signs in last 24 hours:  Temp:  [98 F (36.7 C)-98.4 F (36.9 C)] 98 F (36.7 C) (05/07 1040) Pulse Rate:  [73-86] 86 (05/07 1130) Resp:  [18-24] 19 (05/07 1130) BP: (111-132)/(56-75) 113/70 mmHg (05/07 1130) SpO2:  [97 %-100 %] 97 % (05/07 1130) Weight:  [95.618 kg (210 lb 12.8 oz)-100 kg (220 lb 7.4 oz)] 100 kg (220 lb 7.4 oz) (05/07 1040)  Weight change: -2.582 kg (-5 lb 11.1 oz) Filed Weights   01/27/15 0448 01/28/15 0609 01/28/15 1040  Weight: 94.8 kg (208 lb 15.9 oz) 95.618 kg (210 lb 12.8 oz) 100 kg (220 lb 7.4 oz)    Intake/Output: I/O last 3 completed shifts: In: 3689 [I.V.:3401; Blood:288] Out: 3825 [Urine:725; Emesis/NG output:650]   Intake/Output this shift:     General: not in acute distress Head: +NGT to suction Neck: supple, RIJ central line CVS: regular rate and rhythm Resp: clear  ABD: +distended, +bowel sounds EXT: no edema Neuro: alert and oriented Access: Left arm AVF   Basic Metabolic Panel:  Recent Labs Lab 01/22/15 0435 01/23/15 1445 01/24/15 0940 01/25/15 1246 01/26/15 0510 01/27/15 0500 01/28/15 0454  NA 142 143  --  142 141 140 139  K 3.9 4.1  --  3.7 3.3* 3.1* 2.8*  CL 98* 101  --  100* 100* 97* 98*  CO2 31 24  --  27 28 30 31   GLUCOSE 94 76  --  86 87 97 118*  BUN 38* 56*  --  34* 41* 26* 34*  CREATININE 8.36* 9.87*  --  7.06* 8.00* 5.61* 6.72*  CALCIUM 7.9* 8.0*  --  7.7* 7.4* 7.5* 7.5*  MG  --   --  2.2  --   --  1.8 1.8  PHOS 5.3* 7.0*  --  5.4* 6.2*  --  4.2    Liver Function Tests:  Recent Labs Lab 01/23/15 1445 01/25/15 1246 01/26/15 0510  ALBUMIN 2.1* 2.3* 2.1*   No results for input(s): LIPASE, AMYLASE in the last 168 hours. No results for input(s): AMMONIA in the last 168  hours.  CBC:  Recent Labs Lab 01/24/15 0527 01/25/15 1246 01/26/15 0510 01/26/15 1745 01/27/15 0500 01/28/15 0454  WBC 11.5* 9.5 8.9  --  11.3* 12.6*  HGB 7.3* 7.4* 6.3* 7.5* 6.9* 7.7*  HCT 22.4* 23.6* 19.4* 23.3* 21.6* 23.1*  MCV 89.7 89.2 89.6  --  88.8 87.8  PLT 271 302 258  --  259 258    Cardiac Enzymes: No results for input(s): CKTOTAL, CKMB, CKMBINDEX, TROPONINI in the last 168 hours.  BNP: Invalid input(s): POCBNP  CBG:  Recent Labs Lab 01/27/15 1803 01/27/15 2024 01/28/15 0225 01/28/15 0733  GLUCAP 97 134* 138* 135*    Microbiology: Results for orders placed or performed during the hospital encounter of 01/18/15  Culture, blood (single)     Status: None (Preliminary result)   Collection Time: 01/18/15  7:11 PM  Result Value Ref Range Status   Micro Text Report   Preliminary       COMMENT                   NO GROWTH IN 48 HOURS   ANTIBIOTIC  Culture, blood (single)     Status: None (Preliminary result)   Collection Time: 01/18/15  7:11 PM  Result Value Ref Range Status   Micro Text Report   Preliminary       COMMENT                   NO GROWTH IN 48 HOURS   ANTIBIOTIC                                                      Urine culture     Status: None   Collection Time: 01/18/15  7:36 PM  Result Value Ref Range Status   Micro Text Report   Final       SOURCE: INDWELLING CATHETER    COMMENT                   NO GROWTH IN 36 HOURS   ANTIBIOTIC                                                      Urine culture     Status: None   Collection Time: 01/20/15  4:09 PM  Result Value Ref Range Status   Micro Text Report   Final       SOURCE: INDWELLING CATH    ORGANISM 1                2000 CFU/ML ENTEROBACTER SPECIES   COMMENT                   UNABLE TO FURTHER SPECIATE   COMMENT                   -   ANTIBIOTIC                    ORG#1     CEFAZOLIN                     R          CEFOXITIN                     R         CEFTRIAXONE                   R         CIPROFLOXACIN                 I         ERTAPENEM                     S         GENTAMICIN                    S         IMIPENEM                      S         LEVOFLOXACIN  I         NITROFURANTOIN                I         TRIMETH/SULFA                 S             Coagulation Studies: No results for input(s): LABPROT, INR in the last 72 hours.  Urinalysis: No results for input(s): COLORURINE, LABSPEC, PHURINE, GLUCOSEU, HGBUR, BILIRUBINUR, KETONESUR, PROTEINUR, UROBILINOGEN, NITRITE, LEUKOCYTESUR in the last 72 hours.  Invalid input(s): APPERANCEUR    Imaging: Dg Chest 2 View  01/26/2015   CLINICAL DATA:  CURRENT PNA, SBO, PT HAS NG TUBE, PT IS ON DIALYSIS AND HAS HX OF PAF AND CHF  EXAM: CHEST  2 VIEW  COMPARISON:  01/18/2015  FINDINGS: Moderate enlargement of the cardiopericardial silhouette. Small left and probable small right pleural effusions.  No mediastinal or hilar masses.  Mild lung base opacity, most likely atelectasis. On the left, pneumonia should be considered likely if there are consistent clinical symptoms.  No pulmonary edema.  No pneumothorax.  Nasogastric tube, new, passes below the diaphragm into the stomach.  Right internal jugular central venous line has its tip in the upper superior vena cava, which appears retracted superiorly from the previous exam.  IMPRESSION: 1. Stable cardiomegaly. 2. Lung base opacity, left greater than right. There is a small left pleural effusion and a probable small right pleural effusion. Additional lung base opacity is likely atelectasis. Pneumonia in the left is possible P the appearance is similar to the most recent prior exam. 3. No pulmonary edema.  No new lung abnormalities. 4. Right IJ central venous line appears retracted. Tip now projects in the upper superior vena cava. 5. Nasogastric tube passes below the diaphragm into the stomach,  new from the prior study.   Electronically Signed   By: Lajean Manes M.D.   On: 01/26/2015 14:41   Dg Abd 1 View  01/27/2015   CLINICAL DATA:  Ileus, abdominal pain  EXAM: ABDOMEN - 1 VIEW  COMPARISON:  01/26/2015  FINDINGS: Tip of nasogastric tube projects over gastric antrum.  Prior endoluminal stenting of abdominal aorta and iliac arteries.  BILATERAL ureteral stents present.  Dilatation of small bowel loops without bowel wall thickening.  Scattered gas within nondistended colon and rectum.  Bones diffusely demineralized.  Atelectasis versus consolidation LEFT lower lobe.  IMPRESSION: Diffuse small bowel dilatation, question ileus though obstruction not excluded ; radiographic followup as clinically indicated.  Atelectasis versus consolidation LEFT lower lobe.   Electronically Signed   By: Lavonia Dana M.D.   On: 01/27/2015 11:10   Dg Abd 1 View  01/26/2015   CLINICAL DATA:  Increased abdominal distension  EXAM: ABDOMEN - 1 VIEW  COMPARISON:  None.  FINDINGS: There is gaseous distention of small bowel and colon. There is no evidence of pneumoperitoneum, portal venous gas or pneumatosis. There are no pathologic calcifications along the expected course of the ureters. There are bilateral ureteral stents. There is an aorto bi-iliac stent graft present. There are embolization coils which are likely within the bilateral internal iliac arteries. 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  IMPRESSION: Gaseous distention of small bowel colon concerning for an ileus.   Electronically Signed   By: Kathreen Devoid   On: 01/26/2015 21:21   Ct Chest Wo Contrast  01/26/2015   CLINICAL DATA:  Encephalopathy, shortness of breath  EXAM: CT CHEST WITHOUT CONTRAST  TECHNIQUE: Multidetector CT imaging of the chest was performed following the standard protocol without IV contrast.  COMPARISON:  None.  FINDINGS: The central airways are patent. There are small bilateral pleural effusions, left greater than right. There is  no focal consolidation. There is mild bibasilar atelectasis. There is no pneumothorax.  There are no pathologically enlarged axillary, hilar or mediastinal lymph nodes.  The heart size is normal. There is a moderate pericardial effusion measuring 3.2 cm along the left ventricle. 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 There is multi vessel coronary artery atherosclerosis involving the left main, lad, circumflex and RCA.  Review of bone windows demonstrates no focal lytic or sclerotic lesions.  Limited non-contrast images of the upper abdomen were obtained. There are 2 hypodense nodules arising from the right adrenal gland measuring 14 mm and 18 mm respectively most consistent with adrenal adenomas. There is nodularity of the left adrenal gland. There is a nasogastric tube with the tip in the stomach. Partially visualized is left hydronephrosis.  IMPRESSION: 1. Bilateral small pleural effusions, left greater than right with bibasilar atelectasis. 2. Interval development of a moderate pericardial effusion measuring 3.2 cm along the left lateral ventricle. 3. Multi vessel coronary artery atherosclerosis. 4. Distal aortic arch measures 4.7 cm in diameter. Recommend semi-annual imaging followup by CTA or MRA and referral to cardiothoracic surgery if not already obtained. This recommendation follows 2010 ACCF/AHA/AATS/ACR/ASA/SCA/SCAI/SIR/STS/SVM Guidelines for the Diagnosis and Management of Patients With Thoracic Aortic Disease. Circulation. 2010; 121: e266-e36   Electronically Signed   By: Kathreen Devoid   On: 01/26/2015 21:20     Medications:   . amiodarone 30 mg/hr (01/27/15 0513)  . TPN (CLINIMIX) Adult without lytes 40 mL/hr at 01/27/15 1835  . TPN (CLINIMIX) Adult without lytes     . atorvastatin  40 mg Oral Daily  . ciprofloxacin  400 mg Intravenous Q24H  . insulin aspart  0-9 Units Subcutaneous Q6H  . sodium chloride 0.45 % with kcl   Intravenous Once  .  sodium chloride  10-40 mL Intracatheter Q12H  . sulfamethoxazole-trimethoprim  1 tablet Oral Q12H   sodium chloride, sodium chloride, acetaminophen, diphenhydrAMINE, ondansetron (ZOFRAN) IV, phenol, sodium chloride, sodium chloride  Assessment/ Plan:  74 y.o. 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 Seen on hemodialysis. Tolerating treatment well.  - below dry weight due to GI losses.  - Continue TTS schedule.   - potassium low, 3 K bath  2. Anemia of chronic kidney disease: D63.1 hgb 7.7 - holding epo due to prostate cancer.  - transfusion on 5/5 and 5/6   3. Secondary Hyperparathyroidism: N25.81 phos at goal. Currently NPO and not on binders. On sevelamer as outpatient.   4. Ileus: K56.7 NGT to suction. Continue supportive care. Appreciate surgery input. - placed on TPN  5. A. Fib with RVR: I48.91 converted to sinus rhythm, on amiodarone. - Appreciate cards input.   6. Hypertension: well controlled. Currently holding amlodipine.   7. Urinary tract Infection: now switched to trimethoprim/sulfamethoxazole.  Discussed     LOS: [place], [name] 5/7/201612:33 PM

## 2015-01-28 NOTE — Progress Notes (Signed)
VSS. Foley chronic. 2 L of oxygen. Npo except meds. GI consult was added. NSR. NG tube draining dark green liquid. HD removed nothing. A & Ol. FS are stable. TPN . NS @ 75. Amio drip @ 16.7. Hbg 7.7. WBC elevated. Has not reported any pain. K was IV replaced. Pt has no futher concerns at this time.

## 2015-01-28 NOTE — Progress Notes (Signed)
Notified by lab of critical value for potassium. MD notified. Acknowledged

## 2015-01-28 NOTE — Progress Notes (Signed)
Pre-hd tx 

## 2015-01-28 NOTE — Progress Notes (Signed)
Pt alertx4. VSS. No complaints of pain. Pt having difficulty getting comfortable in bed. Assistance to reposition as needed. Pt requesting something to help him sleep. MD notified. Medication given. Pt up to chair for about two hours then back to bed. Pt resting in bed. Will monitor.

## 2015-01-28 NOTE — Plan of Care (Signed)
Problem: Phase II Progression Outcomes Goal: Progress activity as tolerated unless otherwise ordered Outcome: Progressing Pt requested to get into chair. Attempted with two person assist. Pt unable to assist. Pt required lift to return to bed due to inability to help.

## 2015-01-28 NOTE — Progress Notes (Signed)
This note also relates to the following rows which could not be included: Pulse Rate - Cannot attach notes to unvalidated device data Resp - Cannot attach notes to unvalidated device data SpO2 - Cannot attach notes to unvalidated device data   HD tx completed

## 2015-01-28 NOTE — Progress Notes (Signed)
Patient ID: [name], male   DOB: 08-Mar-1941, 74 y.o.   MRN: [id number]    Patient: [name] / Admit Date: 01/18/2015 / Date of Encounter: 01/28/2015, 11:56 AM   Subjective: Was back in a-fib with RVR on 5/5 while in HD. Placed back on amiodarone gtt as he is not taking anything by mouth 2/2 ileus. Converted to NSR at 11:00 PM on 5/5 on the above amiodarone gtt. No complaints of SOB, chest pain, palpitations. Chest CT on 5/5 showed interval development of moderate pericardial effusion measuring 3.2 cm along the left lateral ventricle Bilateral small pleural effusions, left greater than right with bibasilar atelectasis. Multivessel CAD. Distal aortic arch measure 4.7 cm in diameter.  Echo (5/6) with EF 45-50%, diffuse hypokinesis, moderate pericardial effusion without tamponade.   Remains in NSR this morning.  Seen in dialysis.  BP stable, denies dyspnea.   Review of Systems: ROS  Objective: Telemetry: NSR, 70s, converted to NSR at 11 PM on 5/5 Physical Exam: Blood pressure 113/70, pulse 86, temperature 98 F (36.7 C), temperature source Oral, resp. rate 19, height 5' 6.97" (1.701 m), weight 220 lb 7.4 oz (100 kg), SpO2 97 %. Body mass index is 34.56 kg/(m^2). General: Well developed, well nourished, in no acute distress. Head: Normocephalic, atraumatic, sclera non-icteric, no xanthomas, nares are without discharge. Neck: Negative for carotid bruits. JVP not elevated. Lungs: Decreased bilaterally without wheezes, rales, or rhonchi. Breathing is unlabored. Heart: RRR S1 S2 without murmurs, rubs, or gallops.  Abdomen: Soft, non-tender, non-distended with normoactive bowel sounds. No rebound/guarding. Extremities: No clubbing or cyanosis. No edema. Distal pedal pulses are 2+ and equal bilaterally. Neuro: Somewhat sedated. Moves all extremities spontaneously. Psych:  Responds to questions appropriately with a normal affect.   Intake/Output Summary (Last 24 hours) at 01/28/15  1156 Last data filed at 01/28/15 0900  Gross per 24 hour  Intake    288 ml  Output    775 ml  Net   -487 ml    Inpatient Medications:  . atorvastatin  40 mg Oral Daily  . ciprofloxacin  400 mg Intravenous Q24H  . insulin aspart  0-9 Units Subcutaneous Q6H  . sodium chloride 0.45 % with kcl   Intravenous Once  . sodium chloride  10-40 mL Intracatheter Q12H  . sulfamethoxazole-trimethoprim  1 tablet Oral Q12H   Infusions:  . amiodarone 30 mg/hr (01/27/15 0513)  . TPN (CLINIMIX) Adult without lytes 40 mL/hr at 01/27/15 1835    Labs:  Recent Labs  01/26/15 0510 01/27/15 0500 01/28/15 0454  NA 141 140 139  K 3.3* 3.1* 2.8*  CL 100* 97* 98*  CO2 28 30 31   GLUCOSE 87 97 118*  BUN 41* 26* 34*  CREATININE 8.00* 5.61* 6.72*  CALCIUM 7.4* 7.5* 7.5*  MG  --  1.8 1.8  PHOS 6.2*  --  4.2    Recent Labs  01/25/15 1246 01/26/15 0510  ALBUMIN 2.3* 2.1*    Recent Labs  01/27/15 0500 01/28/15 0454  WBC 11.3* 12.6*  HGB 6.9* 7.7*  HCT 21.6* 23.1*  MCV 88.8 87.8  PLT 259 258   No results for input(s): CKTOTAL, CKMB, TROPONINI in the last 72 hours. Invalid input(s): POCBNP No results for input(s): HGBA1C in the last 72 hours.   Weights: Filed Weights   01/27/15 0448 01/28/15 0609 01/28/15 1040  Weight: 208 lb 15.9 oz (94.8 kg) 210 lb 12.8 oz (95.618 kg) 220 lb 7.4 oz (100 kg)  Radiology/Studies:  Dg Chest 2 View  01/26/2015   CLINICAL DATA:  CURRENT PNA, SBO, PT HAS NG TUBE, PT IS ON DIALYSIS AND HAS HX OF PAF AND CHF  EXAM: CHEST  2 VIEW  COMPARISON:  01/18/2015  FINDINGS: Moderate enlargement of the cardiopericardial silhouette. Small left and probable small right pleural effusions.  No mediastinal or hilar masses.  Mild lung base opacity, most likely atelectasis. On the left, pneumonia should be considered likely if there are consistent clinical symptoms.  No pulmonary edema.  No pneumothorax.  Nasogastric tube, new, passes below the diaphragm into the stomach.   Right internal jugular central venous line has its tip in the upper superior vena cava, which appears retracted superiorly from the previous exam.  IMPRESSION: 1. Stable cardiomegaly. 2. Lung base opacity, left greater than right. There is a small left pleural effusion and a probable small right pleural effusion. Additional lung base opacity is likely atelectasis. Pneumonia in the left is possible P the appearance is similar to the most recent prior exam. 3. No pulmonary edema.  No new lung abnormalities. 4. Right IJ central venous line appears retracted. Tip now projects in the upper superior vena cava. 5. Nasogastric tube passes below the diaphragm into the stomach, new from the prior study.   Electronically Signed   By: Lajean Manes M.D.   On: 01/26/2015 14:41   Dg Abd 1 View  01/27/2015   CLINICAL DATA:  Ileus, abdominal pain  EXAM: ABDOMEN - 1 VIEW  COMPARISON:  01/26/2015  FINDINGS: Tip of nasogastric tube projects over gastric antrum.  Prior endoluminal stenting of abdominal aorta and iliac arteries.  BILATERAL ureteral stents present.  Dilatation of small bowel loops without bowel wall thickening.  Scattered gas within nondistended colon and rectum.  Bones diffusely demineralized.  Atelectasis versus consolidation LEFT lower lobe.  IMPRESSION: Diffuse small bowel dilatation, question ileus though obstruction not excluded ; radiographic followup as clinically indicated.  Atelectasis versus consolidation LEFT lower lobe.   Electronically Signed   By: Lavonia Dana M.D.   On: 01/27/2015 11:10   Dg Abd 1 View  01/26/2015   CLINICAL DATA:  Increased abdominal distension  EXAM: ABDOMEN - 1 VIEW  COMPARISON:  None.  FINDINGS: There is gaseous distention of small bowel and colon. There is no evidence of pneumoperitoneum, portal venous gas or pneumatosis. There are no pathologic calcifications along the expected course of the ureters. There are bilateral ureteral stents. There is an aorto bi-iliac stent graft  present. There are embolization coils which are likely within the bilateral internal iliac arteries. 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  IMPRESSION: Gaseous distention of small bowel colon concerning for an ileus.   Electronically Signed   By: Kathreen Devoid   On: 01/26/2015 21:21   Dg Abd 1 View  01/26/2015   CLINICAL DATA:  Small-bowel obstruction.  EXAM: ABDOMEN - 1 VIEW  COMPARISON:  01/25/2015.  FINDINGS: NG tube tip projected over the upper stomach. Bilateral double-J ureteral stents. Aorto iliac stent graft. Hypogastric coils noted. Mild improvement of small bowel distention. Small bowel distention however remains. No free air. Persistent left lower lobe atelectasis and/or infiltrate with left pleural effusion.  IMPRESSION: 1. NG tube tip projected over the upper stomach. Again advancement of approximately 10 cm is suggested to ensure that the proximal port lies below the GE junction. 2. Persistent but slightly improved small bowel distention. 3. Persistent left lower lobe atelectasis and/or infiltrate with left pleural effusion.  These results will be called to the ordering clinician or representative by the Radiologist Assistant, and communication documented in the PACS or zVision Dashboard.   Electronically Signed   By: Marcello Moores  Register   On: 01/26/2015 07:29   Dg Abd 1 View  01/25/2015   CLINICAL DATA:  Abdominal pain, improving symptoms  EXAM: ABDOMEN - 1 VIEW  COMPARISON:  Supine abdominal film dated Jan 24, 2015  FINDINGS: There has been interval placement of a nasogastric tube. The proximal port lies at or just below the level of the GE junction. There remain loops of mildly distended gas-filled small bowel. The caliber of the colon is normal. There small amount of rectal gas. Bilateral double-J ureteral stents are unchanged in position. There is an aorto bi-iliac stent graft in place. There are metallic coils in the pelvis likely from previous embolization procedures.  IMPRESSION:  There has been slight interval increase in the volume of small bowel gas consistent with a mid to distal small bowel obstruction. Advancement of the nasogastric tube by 10 cm is recommended to assure that the proximal port lies below the GE junction.   Electronically Signed   By: David  Martinique M.D.   On: 01/25/2015 07:53   Dg Abd 1 View  01/24/2015   CLINICAL DATA:  Ileus.  EXAM: ABDOMEN - 1 VIEW  COMPARISON:  01/23/2015.  01/22/2015.  FINDINGS: Bilateral double-J ureteral stents noted. Prior aortoiliac stent graft placement with bilateral hypogastric coils. Surgical clips in the abdomen and pelvis. Dilated loops of small bowel noted. These have slightly improved. Colon is nondistended. Oral contrast is present in the colon. No free air. Vascular calcification. Left lower lobe atelectasis and/or infiltrate with small left pleural effusion. Cardiomegaly. Diffuse osteopenia. Degenerative changes lumbar spine and both hips.  IMPRESSION: 1. Dilated loops of small bowel noted. Small bowel obstruction cannot be excluded . Slight interim improvement. 2. Bilateral double-J ureteral stents in good anatomic position. 3. Aortoiliac stent graft noted. 4. Left lower lobe atelectasis and/or infiltrate with small left pleural effusion. 5. Cardiomegaly.   Electronically Signed   By: Marcello Moores  Register   On: 01/24/2015 08:27   Dg Abd 1 View  01/23/2015   CLINICAL DATA:  Abdominal pain, nausea, abdominal distension, ileus  EXAM: ABDOMEN - 1 VIEW  COMPARISON:  01/22/2015  FINDINGS: Pelvic embolization coils.  Aortoiliac stenting.  BILATERAL ureteral stents.  Persistent dilatation of small bowel loops question ileus versus small bowel obstruction, with slightly less small bowel gas and on the previous study.  No definite bowel wall thickening.  Bones diffusely demineralized.  IMPRESSION: Slightly decreased small bowel gaseous distention since previous exam, question ileus versus small bowel obstruction.   Electronically Signed   By:  Lavonia Dana M.D.   On: 01/23/2015 14:11   Dg Abd 1 View  01/22/2015   CLINICAL DATA:  74 year old male with a history of abdominal pain. Nausea  EXAM: ABDOMEN - 1 VIEW  COMPARISON:  01/21/2015, 01/18/2015  FINDINGS: Two frontal supine images.  Multiple distended small bowel loops, as was seen on the comparison study. Paucity of colonic gas. Small amount of retained enteric contrast within the left colon. The enteric contrast was not present on the comparison plain film study, and has progressed through the colon in the last 24 hours.  No definite evidence of free air.  Diffuse osteopenia.  No acute fracture.  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 Changes of bilateral hypogastric  artery coiling.  Wire-less pressure monitor to the left lateral aspect of the endograft.  Bilateral double-J ureteral stents in place.  IMPRESSION: Persisting dilated small bowel loops, either an ileus or small-bowel obstruction. If there is concern for acute intra abdominal process, CT would be indicated.  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 A wireless intrasac pressure monitor (CardioMEMES device) projects to the left aspect of the graft.  Unchanged position of bilateral double-J ureteral stents.  Signed,  Dulcy Fanny. Earleen Newport, DO  [facility]  [facility]   Electronically Signed   By: Corrie Mckusick D.O.   On: 01/22/2015 09:40   Dg Abd 1 View  01/21/2015   CLINICAL DATA:  Abdominal pain  EXAM: ABDOMEN - 1 VIEW  COMPARISON:  CT abdomen and pelvis January 18, 2015  FINDINGS: There are double-J stents bilaterally. There is also an aorto bi-iliac stent for aneurysm. There is generalized bowel dilatation in a pattern suggesting ileus. No free air is seen on this supine examination. There are no air-fluid levels. There is atelectatic change in the lung bases.  There are coils in the pelvis, presumably due to internal iliac artery embolization.  IMPRESSION: Bowel gas pattern most consistent with ileus. No free air seen. Double-J stents present as well as aortoiliac stent. Coils are present in the pelvis as well.   Electronically Signed   By: Lowella Grip III M.D.   On: 01/21/2015 10:12   Ct Chest Wo Contrast  01/26/2015   CLINICAL DATA:  Encephalopathy, shortness of breath  EXAM: CT CHEST WITHOUT CONTRAST  TECHNIQUE: Multidetector CT imaging of the chest was performed following the standard protocol without IV contrast.  COMPARISON:  None.  FINDINGS: The central airways are patent. There are small bilateral pleural effusions, left greater than right. There is no focal consolidation. There is mild bibasilar atelectasis. There is no pneumothorax.  There are no pathologically enlarged axillary, hilar or mediastinal lymph nodes.  The heart size is normal. There is a moderate pericardial effusion measuring 3.2 cm along the left ventricle. 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 There is multi vessel coronary artery atherosclerosis involving the left main, lad, circumflex and RCA.  Review of bone windows demonstrates no focal lytic or sclerotic lesions.  Limited non-contrast images of the upper abdomen were obtained. There are 2 hypodense nodules arising from the right adrenal gland measuring 14 mm and 18 mm respectively most consistent with adrenal adenomas. There is nodularity of the left adrenal gland. There is a nasogastric tube with the tip in the stomach. Partially visualized is left hydronephrosis.  IMPRESSION: 1. Bilateral small pleural effusions, left greater than right with bibasilar atelectasis. 2. Interval development of a moderate pericardial effusion measuring 3.2 cm along the left lateral ventricle. 3. Multi vessel coronary artery atherosclerosis. 4. Distal aortic arch measures 4.7 cm in diameter. Recommend  semi-annual imaging followup by CTA or MRA and referral to cardiothoracic surgery if not already obtained. This recommendation follows 2010 ACCF/AHA/AATS/ACR/ASA/SCA/SCAI/SIR/STS/SVM Guidelines for the Diagnosis and Management of Patients With Thoracic Aortic Disease. Circulation. 2010; 121: e266-e36   Electronically Signed   By: Kathreen Devoid   On: 01/26/2015 21:20   Dg Chest Port 1 View  01/18/2015   CLINICAL DATA:  74 year old male status post central line placement  EXAM: PORTABLE CHEST - 1 VIEW  COMPARISON:  CT scan chest/abdomen/ pelvis obtained earlier today ; most recent  prior chest x-ray 07/08/2014  FINDINGS: External defibrillator pad projects over the chest. Interval placement of a right IJ approach central venous catheter. The catheter is somewhat lateral in position. The tip is in the region of the distal SVC. Increased enlargement of the cardiopericardial silhouette. And atherosclerotic thoracic aorta. Pulmonary vascular congestion. All  IMPRESSION: 1. The tip of the right IJ approach central venous catheter is in the region of the distal SVC although it is slightly more lateral than expected. Recommend clinical correlation to insure that the catheter is within the venous system. 2. No evidence of pneumothorax or new pleural effusion. 3. Cardiomegaly. 4. Pulmonary vascular congestion, low lung volumes and bibasilar atelectasis.   Electronically Signed   By: Jacqulynn Cadet M.D.   On: 01/18/2015 21:22   Ct Angiography Chest/abd/pelvis ([facility] Hx)  01/18/2015   CLINICAL DATA:  Patient to ED via EMS from home with c.o respiratory distress, atrial fib, and groin pain.  EXAM: CT ANGIOGRAPHY CHEST, ABDOMEN AND PELVIS  TECHNIQUE: Multidetector CT imaging through the chest, abdomen and pelvis was performed using the standard protocol during bolus administration of intravenous contrast. Multiplanar reconstructed images and MIPs were obtained and reviewed to evaluate the vascular anatomy.  CONTRAST:  100 mL  of Omnipaque 350 intravenous contrast.  COMPARISON:  None.  FINDINGS: CTA CHEST FINDINGS  Thoracic aorta shows atherosclerotic irregularity along the arch and descending aorta. There is mild dilation of the thoracic aorta most prominently of the descending aorta where it measures 4.8 cm. No aortic dissection. The aortic branch vessels are widely patent. Mild atherosclerotic calcifications are noted the base of the left subclavian artery and along the innominate artery.  The heart is moderately enlarged. There are moderate coronary artery calcifications. No neck base or axillary masses or adenopathy. No mediastinal or hilar masses or adenopathy. There is lower lobe opacity, left greater than right, as well as dependent left upper lobe opacity, most likely all atelectasis. Pneumonia is not excluded but felt less likely. There is no pulmonary edema. 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 Minimal left pleural effusion. No pneumothorax.  Review of the MIP images confirms the above findings.  CTA ABDOMEN AND PELVIS FINDINGS  There is an excluded infrarenal abdominal aortic aneurysm. Native aneurysm measures 5.4 cm x 4.7 cm. The excluded lumen is widely patent. Stents extend into the external iliac arteries, also widely patent. There is dilation of the common femoral arteries. There is an aneurysm or pseudoaneurysm surrounding the right common femoral artery and proximal superficial femoral and deep femoral arteries, measuring 5.7 cm x 5.3 cm. There is no evidence of aortic rupture.  There is significant narrowing at the origin of the celiac axis, measuring 70%. The superior mesenteric artery is widely patent. Mild plaque is noted on the renal arteries without significant stenosis.  Liver, spleen, gallbladder, pancreas: Unremarkable. There is mild thickening of the adrenal glands suggesting nodular hyperplasia. This is stable. There is significant bilateral hydronephrosis. Bilateral ureteral stents extend from  the renal pelves to the bladder. There is bilateral renal cortical thinning. No convincing renal mass. Bladder mildly thick-walled. Is distended.  No discrete enlarged lymph node.  No abnormal fluid collections.  There are scattered colonic diverticula. No diverticulitis. No bowel wall thickening or mesenteric inflammation. There is no bowel dilation to suggest obstruction.  MUSCULOSKELETAL  Bones are demineralized. There are degenerative changes throughout the visualized spine. There are areas of sclerosis in the posterior aspect of the T12 vertebrae with a small focus  of sclerosis in the T7 vertebrae. T12 focus of sclerosis is chronic unchanged from the prior abdomen and pelvis CT. There are no fractures. No osteolytic lesions. There are arthropathic changes of both hip joints.  Review of the MIP images confirms the above findings.  IMPRESSION: 1. No evidence of aortic dissection or of a a rupture of the abdominal aortic aneurysm. 2. No acute vascular conclusion. 3. Dilation of the descending thoracic aorta. 4. Excluded infrarenal abdominal aortic aneurysm. The excluded portion of the aneurysm is widely patent. 5. 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 6. Bilateral significant hydronephrosis with bilateral ureteral stents which appear well-positioned. The degree of hydronephrosis and the ureteral stents are stable from the prior CT. 7. Mild bladder wall thickening. Consider cystitis in the proper clinical setting. 8. In the chest, there is moderate cardiomegaly and dependent atelectasis, greater on the left. No convincing pneumonia and no pulmonary edema.   Electronically Signed   By: Lajean Manes M.D.   On: 01/18/2015 18:41   US Kidneys ([facility] Hx)  01/19/2015   ADDENDUM REPORT: 01/19/2015 16:52  ADDENDUM: When directly comparing to recent CT, the hydronephrosis in the right kidney is moderately improved, particularly in the interpolar region  and lower pole collecting system. The hydronephrosis in the left kidney is mildly improved.  Notably, both kidneys are likely mildly improved when compared to CT dated 06/24/2014, indicating that the overall appearance is chronic.   Electronically Signed   By: Julian Hy M.D.   On: 01/19/2015 16:52   01/19/2015   CLINICAL DATA:  Bilateral hydronephrosis  EXAM: RENAL / URINARY TRACT ULTRASOUND COMPLETE  COMPARISON:  CT abdomen pelvis dated 01/18/2015  FINDINGS: Right Kidney:  Length: 11.7 cm. Moderate hydronephrosis. Indwelling ureteral stent.  Left Kidney:  Length: 11.9 cm. Moderate hydronephrosis. Indwelling ureteral stent.  Bladder:  Decompressed by indwelling Foley catheter.  IMPRESSION: Moderate hydronephrosis with indwelling ureteral stents bilaterally.  Bladder decompressed by indwelling Foley catheter.  Electronically Signed: By: Julian Hy M.D. On: 01/19/2015 16:23     Assessment and Plan  74 y.o. 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 1. New onset a-fib with RVR and hypotension: In setting of septic shock.  He is now back in NSR on amiodarone gtt.  - Continue amiodarone gtt, still has NGT. - Holding anticoagulation for now given anemia - Would not use diltiazem given low EF.  2. Pericardial effusion: Moderate circumferential effusion on echo, no tamponade.  3. 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  EF 45-50% on echo this admission.  -He will need cardiac cath once stable, including anemia work up  -Lipitor 40 mg daily - Add low dose beta blocker (Coreg) if BP remains  stable. - Volume controlled by HD.  4. Septic shock/urosepsis: Enterobacter urosepsis.  Abx per primary service.  Hypotension resolved.  5. ESRD on HD 6. SBO: -NG tube in place -Still not tolerating diet  Loralie Champagne 01/28/2015 12:02 PM

## 2015-01-28 NOTE — Consult Note (Signed)
GI Inpatient Consult Note Ethan Sails MD  Reason for Consult: Blood in gastric secretions, anemia.   Attending Requesting Consult: Marthann Schiller, M.D.  Outpatient Primary Physician:   History of Present Illness: [name] is a 74 y.o. male admitted to [facility] on 01/18/2015. He was brought to the emergency room with complaint of shortness of breath. He was found to be in atrial fibrillation with rapid ventricular rate area further had hypotension and altered mental status. He was found to have urosepsis on evaluation. He has a complicated medical history including history of end-stage renal disease on hemodialysis with history of bladder cancer, chronic obstructive nephropathy status post bilateral ureteral stents with recurrent UTIs. Also has a history of prostate cancer which has been treated with Lupron. History of atrial fibrillation as noted. His hospital stay has been complicated by anemia hospital concern for this.  When patient developed his ileus and NG tube was placed and there was noted a tinge of blood in the gastric secretions. He has been on low wall suction since that time. Prior to his hospitalization denies any issues with nausea, vomiting, abdominal pain, heartburn, dysphagia. There is no history of gastric ulcer disease is never had an upper or lower scope in the past.  Currently there is no nausea or abdominal pain. His uncomfortable with the NG tube in his nare. Serial abdominal films have been inserting for possible ileus although this seems to have improved that more significant pattern. There have been no stools recorded since his hospitalization. There's been no evidence of melena. He has been no evidence of gross blood in the NG tube although there is a tinge noted. NG tube drainage is basically greenish  Past Medical History:  Past Medical History  Diagnosis Date  . PAF (paroxysmal atrial fibrillation)     a. new onset 12/2014 in the  setting of UTI, sepsis, hypotension, and anemia; b. not on long term anticoagulation given anemia; c. family aware of stroke risk, they are ok with this; d. on amiodarone   . Chronic combined systolic and diastolic CHF (congestive heart failure)     a. echo 12/2014: EF 25-30%, anterior wall wall motion abnormalities; b. planned ischemic evaluation once patient is stable medically  . ESRD on hemodialysis     a. Tuesday, Thursday, and Saturdays  . Anemia     a. baseline hgb ~ 8  . History of small bowel obstruction     a. 01/2015    Problem List: Patient Active Problem List   Diagnosis Date Noted  . Ileus   . Chronic combined systolic and diastolic CHF (congestive heart failure)   . ESRD on hemodialysis   . Anemia   . History of small bowel obstruction   . Atrial fibrillation with RVR 01/21/2015    Past Surgical History: History reviewed. No pertinent past surgical history.  Allergies: Allergies  Allergen Reactions  . Penicillins Hives    Home Medications: Prescriptions prior to admission  Medication Sig Dispense Refill Last Dose  . amLODipine (NORVASC) 5 MG tablet Take 5 mg by mouth at bedtime.     . budesonide-formoterol (SYMBICORT) 160-4.5 MCG/ACT inhaler Inhale 2 puffs into the lungs 2 (two) times daily.     . chlorpheniramine (CHLOR-TRIMETON) 4 MG tablet Take 4 mg by mouth every 4 (four) hours as needed for allergies.     Marland Kitchen loperamide (IMODIUM) 2 MG capsule Take 2 mg by mouth every 4 (four) hours as needed for diarrhea or  loose stools.     . magnesium oxide (MAG-OX) 400 MG tablet Take 400 mg by mouth at bedtime.     . NONFORMULARY OR COMPOUNDED ITEM Take 1 tablet by mouth daily. Rena-Vite Vitamin B Complex with C and Folic Acid     . sevelamer carbonate (RENVELA) 800 MG tablet Take 800 mg by mouth 3 (three) times daily with meals.     . Tiotropium Bromide Monohydrate 2.5 MCG/ACT AERS Inhale 1 puff into the lungs daily.      Home medication reconciliation was completed with the  patient.   Scheduled Inpatient Medications:   . atorvastatin  40 mg Oral Daily  . ciprofloxacin  400 mg Intravenous Q24H  . insulin aspart  0-9 Units Subcutaneous Q6H  . sodium chloride  10-40 mL Intracatheter Q12H  . sulfamethoxazole-trimethoprim  1 tablet Oral Q12H    Continuous Inpatient Infusions:   . amiodarone 30 mg/hr (01/27/15 0513)  . TPN (CLINIMIX) Adult without lytes 40 mL/hr at 01/27/15 1835  . TPN (CLINIMIX) Adult without lytes      PRN Inpatient Medications:  acetaminophen, diphenhydrAMINE, ondansetron (ZOFRAN) IV, phenol, sodium chloride, sodium chloride  Family History: family history is not on file.   GI Family History: Negative for colorectal cancer liver disease or ulcers  Social History:   reports that he quit smoking about 2 weeks ago. He does not have any smokeless tobacco history on file. He reports that he does not drink alcohol or use illicit drugs.   ROS  Review of Systems: Per admission history and physical agree with St.  Physical Examination: BP 116/64 mmHg  Pulse 81  Temp(Src) 98 F (36.7 C) (Oral)  Resp 19  Ht 5' 6.97" (1.701 m)  Wt 100 kg (220 lb 7.4 oz)  BMI 34.56 kg/m2  SpO2 100% Gen: Elderly appearing Afro-American male uncomfortable with NG tube in place, no acute distress HEENT: Normocephalic atraumatic eyes are anicteric Neck: No JVD Chest: Occasional minimal rhonchi clear to auscultation otherwise CV: Regular rate and rhythm Abd: Soft nontender minimal distention bowel sounds are positive and normal pitch Ext: No clubbing or cyanosis trace edema Skin: Other:  Data: Lab Results  Component Value Date   WBC 12.6* 01/28/2015   HGB 7.7* 01/28/2015   HCT 23.1* 01/28/2015   MCV 87.8 01/28/2015   PLT 258 01/28/2015    Recent Labs Lab 01/26/15 1745 01/27/15 0500 01/28/15 0454  HGB 7.5* 6.9* 7.7*   Lab Results  Component Value Date   NA 139 01/28/2015   K 2.8* 01/28/2015   CL 98* 01/28/2015   CO2 31 01/28/2015    BUN 34* 01/28/2015   CREATININE 6.72* 01/28/2015   Lab Results  Component Value Date   ALT 10* 07/04/2014   AST 19 07/04/2014   ALKPHOS 139* 07/04/2014   No results for input(s): APTT, INR, PTT in the last 168 hours. CBC Latest Ref Rng 01/28/2015 01/27/2015 01/26/2015  WBC 3.8 - 10.6 K/uL 12.6(H) 11.3(H) -  Hemoglobin 13.0 - 18.0 g/dL 7.7(L) 6.9(L) 7.5(L)  Hematocrit 40.0 - 52.0 % 23.1(L) 21.6(L) 23.3(L)  Platelets 150 - 440 K/uL 258 259 -    STUDIES: Dg Abd 1 View  01/27/2015   CLINICAL DATA:  Ileus, abdominal pain  EXAM: ABDOMEN - 1 VIEW  COMPARISON:  01/26/2015  FINDINGS: Tip of nasogastric tube projects over gastric antrum.  Prior endoluminal stenting of abdominal aorta and iliac arteries.  BILATERAL ureteral stents present.  Dilatation of small bowel loops without bowel wall thickening.  Scattered gas within nondistended colon and rectum.  Bones diffusely demineralized.  Atelectasis versus consolidation LEFT lower lobe.  IMPRESSION: Diffuse small bowel dilatation, question ileus though obstruction not excluded ; radiographic followup as clinically indicated.  Atelectasis versus consolidation LEFT lower lobe.   Electronically Signed   By: Lavonia Dana M.D.   On: 01/27/2015 11:10   Dg Abd 1 View  01/26/2015   CLINICAL DATA:  Increased abdominal distension  EXAM: ABDOMEN - 1 VIEW  COMPARISON:  None.  FINDINGS: There is gaseous distention of small bowel and colon. There is no evidence of pneumoperitoneum, portal venous gas or pneumatosis. There are no pathologic calcifications along the expected course of the ureters. There are bilateral ureteral stents. There is an aorto bi-iliac stent graft present. There are embolization coils which are likely within the bilateral internal iliac arteries. 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  IMPRESSION: Gaseous distention of small bowel colon concerning for an ileus.   Electronically Signed   By: Kathreen Devoid   On: 01/26/2015 21:21   Ct Chest Wo  Contrast  01/26/2015   CLINICAL DATA:  Encephalopathy, shortness of breath  EXAM: CT CHEST WITHOUT CONTRAST  TECHNIQUE: Multidetector CT imaging of the chest was performed following the standard protocol without IV contrast.  COMPARISON:  None.  FINDINGS: The central airways are patent. There are small bilateral pleural effusions, left greater than right. There is no focal consolidation. There is mild bibasilar atelectasis. There is no pneumothorax.  There are no pathologically enlarged axillary, hilar or mediastinal lymph nodes.  The heart size is normal. There is a moderate pericardial effusion measuring 3.2 cm along the left ventricle. 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 There is multi vessel coronary artery atherosclerosis involving the left main, lad, circumflex and RCA.  Review of bone windows demonstrates no focal lytic or sclerotic lesions.  Limited non-contrast images of the upper abdomen were obtained. There are 2 hypodense nodules arising from the right adrenal gland measuring 14 mm and 18 mm respectively most consistent with adrenal adenomas. There is nodularity of the left adrenal gland. There is a nasogastric tube with the tip in the stomach. Partially visualized is left hydronephrosis.  IMPRESSION: 1. Bilateral small pleural effusions, left greater than right with bibasilar atelectasis. 2. Interval development of a moderate pericardial effusion measuring 3.2 cm along the left lateral ventricle. 3. Multi vessel coronary artery atherosclerosis. 4. Distal aortic arch measures 4.7 cm in diameter. Recommend semi-annual imaging followup by CTA or MRA and referral to cardiothoracic surgery if not already obtained. This recommendation follows 2010 ACCF/AHA/AATS/ACR/ASA/SCA/SCAI/SIR/STS/SVM Guidelines for the Diagnosis and Management of Patients With Thoracic Aortic Disease. Circulation. 2010; 121: e266-e36   Electronically Signed   By: Kathreen Devoid   On:  01/26/2015 21:20     Assessment: 1. GI bleeding the setting of an issue with multiple medical issues as noted above. Aspirate in the nasogastric tube has a minimal tinge of red. Gastroccults can be fairly inaccurate when an NG tube has been placed. There is no obvious evidence of ongoing bleeding as there is no melena or hematemesis. Patient has a history of anemia, and anemia would likely be multifactorial. At times hemoglobin readings may vary due to hemodialysis. It is of note that patient has not been on a ulcer prophylaxis either as outpatient or inpatient, although in the setting of his medical illnesses particularly end-stage renal disease he is at high risk for peptic ulcer  disease. 2. Ileus likely secondary to his acute illness.  Recommendations: 1. We'll place patient on an IV PPI for the next 3 days then change to by mouth. 2. Continue to monitor hemoglobin, transfuse as needed 3. Cycle his intermittent low wall suction one half hour off one hour on while on low wall intermittent suction. This will hopefully is less trauma to the lining of the stomach. 4. Will check for Helicobacter pylori by serology 5. Currently patient is not a candidate for sedated luminal evaluation 6. Will follow with you Thank you for the consult. Please call with questions or concerns.  Ethan Sails, MD  01/28/2015 5:23 PM

## 2015-01-28 NOTE — Progress Notes (Signed)
SUBJECTIVE:  Review of Systems:  Subjective/Chief Complaint Admitted fro encephalopathy and SOB. Found to have Afib with RVR and hypotension in ED. Septic shock due to UTI. now he is awake and mental status is improved. Off levophed and amiodarone. Cloudy urine with some blood clots. No complaints of pain or SOB. Had multiple vomits - noted ileus, so NGT placed 01/22/15.  Still no BM or passing gas. On 01/26/15 converted back to A fib with RVR. have NGT suction. Re-started on amiodarone drip 01/26/15, Hb dropped- transfused one unit PRBC 01/26/15& 01/27/15.   Fever/Chills No   Cough No   Sputum No   Abdominal Pain No   Diarrhea No   Constipation No   Nausea/Vomiting no   SOB/DOE No   Chest Pain No   Telemetry Reviewed Afib   Dysuria No   Medications/Allergies Reviewed Medications/Allergies reviewed   VITAL SIGNS/ANCILLARY NOTES: **Vital Signs.: Filed Vitals:   01/28/15 1130  BP: 113/70  Pulse: 86  Temp:   Resp: 19    Physical Exam:  GEN No distress.   HEENT pale conjunctivae, hearing intact to voice, dry oral mucosa,NGT in place. Pink coloured secretions from NGT.   NECK supple  No masses   RESP normal resp effort  clear BS, decreased sound on left side.   CARD irregular rate  No LE edema, tachycardia.   ABD denies tenderness , distended, soft  sluggish BS.   GU foley catheter in place  cloudy urine   EXTR negative cyanosis/clubbing, negative edema   SKIN No rashes, No ulcers   NEURO negative rigidity, negative tremor, follows commands   PSYCH alert, A+O to time, place, person   ASSESSMENT/PLAN:  Assessment/Plan:  Orders/Results reviewed Orders reviewed and updated, Laboratory results reviewed, Radiology results reviewed, Vital Signs reviewed, Nursing documentation reviewed   Assessment/Plan 39 m with Bladder/prostate cancer, ESRD on HD, hypertension here with confusion, SOB   * Afib with RVR due to sepsis  On admission was On amiodarone drip.  swiched to oral amiodarone.   As per cardiology eventually need Cath- no urgency.  Not able to take meds much due to Ileus, but HR was stable till 01/25/15, converted to A fib with RVR 01/26/15- called Cardiology again. Restarted on amiodarone drip.  * Ileus   NPO for now, NGT suction since 01/22/15. Still no BM or   passing gas. Given dulcolax suppository.  No resolution, called surgical consult. Agreed with conservative plan.  As no oral intake for 6 days-  Dietary involved to start TPN.  * GI bleed   Have Occult blood positive in Gastric secretions, and required 2 Units of blood transfusion on last 3 days.   Called GI.  * Septic shock due to UTI   Had ureteral stents- seen by urology and ID.   Was On Levophed for Map >65- improved-     BP   stable.  Initially broad spectrum abx. Per ID tapered to cipro and bactrim.     repeated cx just 2000CFU gr neg rods.  Has ureteral stents and Hx of prostate cancer- may need stent removal and to continue foley - per urology, advised to follow in office.  * ESRD on HD   manage per nephrology  * Anemia of Chronic Disease with worsening due to GI loss  Transfused 1 unit PRBC 01/26/15 and 01/26/14.   Target Hb should be around 8 per cardiology.    * pericardial effusion   Cardio on case- moderate Effusion per echo, but no  temponade. EF is 45%  * Acute resp failure on admission  Aspiration?  Monitor.   Now on room air.  Overall prognosis is very poor due to multiple medical issues  Spoek to Pt and his POA- [name] ( cousin)- they agreed to meet palliative care team to help decide.    Discharge Planning SNF/LTC   Code Status Full Code   Critical Care no, Total patient care time:, 55 minutes   Case Discussed With patient, family, nursing, Dr.phifer

## 2015-01-29 ENCOUNTER — Encounter: Payer: Self-pay | Admitting: Internal Medicine

## 2015-01-29 DIAGNOSIS — I5042 Chronic combined systolic (congestive) and diastolic (congestive) heart failure: Secondary | ICD-10-CM

## 2015-01-29 LAB — BASIC METABOLIC PANEL
ANION GAP: 8 (ref 5–15)
BUN: 20 mg/dL (ref 6–20)
CALCIUM: 7.5 mg/dL — AB (ref 8.9–10.3)
CO2: 30 mmol/L (ref 22–32)
Chloride: 101 mmol/L (ref 101–111)
Creatinine, Ser: 4.64 mg/dL — ABNORMAL HIGH (ref 0.61–1.24)
GFR calc non Af Amer: 11 mL/min — ABNORMAL LOW (ref >60)
GFR, EST AFRICAN AMERICAN: 13 mL/min — AB (ref >60)
Glucose, Bld: 115 mg/dL — ABNORMAL HIGH (ref 65–99)
Potassium: 3.6 mmol/L (ref 3.5–5.1)
Sodium: 139 mmol/L (ref 135–145)

## 2015-01-29 LAB — GLUCOSE, CAPILLARY
GLUCOSE-CAPILLARY: 115 mg/dL — AB (ref 70–99)
GLUCOSE-CAPILLARY: 110 mg/dL — AB (ref 70–99)
Glucose-Capillary: 128 mg/dL — ABNORMAL HIGH (ref 70–99)
Glucose-Capillary: 124 mg/dL — ABNORMAL HIGH (ref 70–99)
Glucose-Capillary: 125 mg/dL — ABNORMAL HIGH (ref 70–99)

## 2015-01-29 LAB — CBC
HCT: 23.3 % — ABNORMAL LOW (ref 40.0–52.0)
Hemoglobin: 7.5 g/dL — ABNORMAL LOW (ref 13.0–18.0)
MCH: 28.5 pg (ref 26.0–34.0)
MCHC: 32.2 g/dL (ref 32.0–36.0)
MCV: 88.6 fL (ref 80.0–100.0)
PLATELETS: 236 10*3/uL (ref 150–440)
RBC: 2.64 MIL/uL — ABNORMAL LOW (ref 4.40–5.90)
RDW: 18 % — AB (ref 11.5–14.5)
WBC: 10.9 10*3/uL — ABNORMAL HIGH (ref 3.8–10.6)

## 2015-01-29 LAB — MAGNESIUM: Magnesium: 1.7 mg/dL (ref 1.7–2.4)

## 2015-01-29 LAB — PHOSPHORUS: Phosphorus: 2.1 mg/dL — ABNORMAL LOW (ref 2.5–4.6)

## 2015-01-29 MED ORDER — POTASSIUM PHOSPHATES 15 MMOLE/5ML IV SOLN
15.0000 mmol | Freq: Once | INTRAVENOUS | Status: AC
  Administered 2015-01-29: 15 mmol via INTRAVENOUS
  Filled 2015-01-29: qty 5

## 2015-01-29 MED ORDER — TRACE MINERALS CR-CU-MN-SE-ZN 10-1000-500-60 MCG/ML IV SOLN
INTRAVENOUS | Status: AC
  Administered 2015-01-29: 19:00:00 via INTRAVENOUS
  Filled 2015-01-29: qty 960

## 2015-01-29 NOTE — Progress Notes (Signed)
Up to chair and tolerated it well. Stayed in the chair for 2 hours. Pt has not reported any pain. A & O. NG tube. Foley. Urology consult completed. Fs are stable. 2 L of oxygen. NSR. Amio drip. TPN going. K was IV replaced. Hbg 7.5. Pt has no further concerns at this time.

## 2015-01-29 NOTE — Progress Notes (Signed)
Patient ID: [name], male   DOB: [birth year], 74 y.o.   MRN: [id number] Patient ID: [name], male   DOB: 07/20/1941, 74 y.o.   MRN: [id number]    Patient: [name] / Admit Date: 01/18/2015 / Date of Encounter: 01/29/2015, 10:59 AM   Subjective: Was back in a-fib with RVR on 5/5 while in HD. Placed back on amiodarone gtt as he is not taking anything by mouth 2/2 ileus. Converted to NSR at 11:00 PM on 5/5 on the above amiodarone gtt. No complaints of SOB, chest pain, palpitations. Chest CT on 5/5 showed interval development of moderate pericardial effusion measuring 3.2 cm along the left lateral ventricle Bilateral small pleural effusions, left greater than right with bibasilar atelectasis. Multivessel CAD. Distal aortic arch measure 4.7 cm in diameter.  Echo (5/6) with EF 45-50%, diffuse hypokinesis, moderate pericardial effusion without tamponade.   Remains in NSR this morning.   BP stable, denies dyspnea.  Still NPO with ice chips.   Review of Systems: ROS  Objective: Telemetry: NSR, 70s, converted to NSR at 11 PM on 5/5 Physical Exam: Blood pressure 103/56, pulse 85, temperature 98 F (36.7 C), temperature source Oral, resp. rate 20, height 5' 6.97" (1.701 m), weight 211 lb 6.7 oz (95.9 kg), SpO2 98 %. Body mass index is 33.14 kg/(m^2). General: Well developed, well nourished, in no acute distress. Head: Normocephalic, atraumatic, sclera non-icteric, no xanthomas, nares are without discharge. Neck: Negative for carotid bruits. JVP not elevated. Lungs: Decreased bilaterally without wheezes, rales, or rhonchi. Breathing is unlabored. Heart: RRR S1 S2 without murmurs, rubs, or gallops.  Abdomen: Soft, non-tender, non-distended with decreased bowel sounds. No rebound/guarding. Extremities: No clubbing or cyanosis. No edema. Distal pedal pulses are 2+ and equal bilaterally. Neuro: Somewhat sedated. Moves all extremities spontaneously. Psych:  Responds to questions  appropriately with a normal affect.   Intake/Output Summary (Last 24 hours) at 01/29/15 1059 Last data filed at 01/29/15 0415  Gross per 24 hour  Intake 1814.67 ml  Output    950 ml  Net 864.67 ml    Inpatient Medications:  . atorvastatin  40 mg Oral Daily  . ciprofloxacin  400 mg Intravenous Q24H  . insulin aspart  0-9 Units Subcutaneous Q6H  . pantoprazole (PROTONIX) IV  40 mg Intravenous Q24H  . sodium chloride  10-40 mL Intracatheter Q12H  . sulfamethoxazole-trimethoprim  1 tablet Oral Q12H   Infusions:  . amiodarone 30 mg/hr (01/27/15 0513)  . TPN (CLINIMIX) Adult without lytes 40 mL/hr at 01/28/15 1818  . TPN (CLINIMIX) Adult without lytes      Labs:  Recent Labs  01/28/15 0454 01/29/15 0633  NA 139 139  K 2.8* PENDING  CL 98* 101  CO2 31 30  GLUCOSE 118* 115*  BUN 34* 20  CREATININE 6.72* 4.64*  CALCIUM 7.5* 7.5*  MG 1.8 1.7  PHOS 4.2 2.1*   No results for input(s): AST, ALT, ALKPHOS, BILITOT, PROT, ALBUMIN in the last 72 hours.  Recent Labs  01/28/15 0454 01/29/15 0633  WBC 12.6* 10.9*  HGB 7.7* 7.5*  HCT 23.1* 23.3*  MCV 87.8 88.6  PLT 258 236   No results for input(s): CKTOTAL, CKMB, TROPONINI in the last 72 hours. Invalid input(s): POCBNP No results for input(s): HGBA1C in the last 72 hours.   Weights: Filed Weights   01/28/15 1040 01/28/15 1400 01/29/15 0328  Weight: 220 lb 7.4 oz (100 kg) 220 lb 7.4 oz (100 kg) 211 lb 6.7 oz (  95.9 kg)     Radiology/Studies:  Dg Chest 2 View  01/26/2015   CLINICAL DATA:  CURRENT PNA, SBO, PT HAS NG TUBE, PT IS ON DIALYSIS AND HAS HX OF PAF AND CHF  EXAM: CHEST  2 VIEW  COMPARISON:  01/18/2015  FINDINGS: Moderate enlargement of the cardiopericardial silhouette. Small left and probable small right pleural effusions.  No mediastinal or hilar masses.  Mild lung base opacity, most likely atelectasis. On the left, pneumonia should be considered likely if there are consistent clinical symptoms.  No pulmonary  edema.  No pneumothorax.  Nasogastric tube, new, passes below the diaphragm into the stomach.  Right internal jugular central venous line has its tip in the upper superior vena cava, which appears retracted superiorly from the previous exam.  IMPRESSION: 1. Stable cardiomegaly. 2. Lung base opacity, left greater than right. There is a small left pleural effusion and a probable small right pleural effusion. Additional lung base opacity is likely atelectasis. Pneumonia in the left is possible P the appearance is similar to the most recent prior exam. 3. No pulmonary edema.  No new lung abnormalities. 4. Right IJ central venous line appears retracted. Tip now projects in the upper superior vena cava. 5. Nasogastric tube passes below the diaphragm into the stomach, new from the prior study.   Electronically Signed   By: Lajean Manes M.D.   On: 01/26/2015 14:41   Dg Abd 1 View  01/27/2015   CLINICAL DATA:  Ileus, abdominal pain  EXAM: ABDOMEN - 1 VIEW  COMPARISON:  01/26/2015  FINDINGS: Tip of nasogastric tube projects over gastric antrum.  Prior endoluminal stenting of abdominal aorta and iliac arteries.  BILATERAL ureteral stents present.  Dilatation of small bowel loops without bowel wall thickening.  Scattered gas within nondistended colon and rectum.  Bones diffusely demineralized.  Atelectasis versus consolidation LEFT lower lobe.  IMPRESSION: Diffuse small bowel dilatation, question ileus though obstruction not excluded ; radiographic followup as clinically indicated.  Atelectasis versus consolidation LEFT lower lobe.   Electronically Signed   By: Lavonia Dana M.D.   On: 01/27/2015 11:10   Dg Abd 1 View  01/26/2015   CLINICAL DATA:  Increased abdominal distension  EXAM: ABDOMEN - 1 VIEW  COMPARISON:  None.  FINDINGS: There is gaseous distention of small bowel and colon. There is no evidence of pneumoperitoneum, portal venous gas or pneumatosis. There are no pathologic calcifications along the expected course  of the ureters. There are bilateral ureteral stents. There is an aorto bi-iliac stent graft present. There are embolization coils which are likely within the bilateral internal iliac arteries. 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  IMPRESSION: Gaseous distention of small bowel colon concerning for an ileus.   Electronically Signed   By: Kathreen Devoid   On: 01/26/2015 21:21   Dg Abd 1 View  01/26/2015   CLINICAL DATA:  Small-bowel obstruction.  EXAM: ABDOMEN - 1 VIEW  COMPARISON:  01/25/2015.  FINDINGS: NG tube tip projected over the upper stomach. Bilateral double-J ureteral stents. Aorto iliac stent graft. Hypogastric coils noted. Mild improvement of small bowel distention. Small bowel distention however remains. No free air. Persistent left lower lobe atelectasis and/or infiltrate with left pleural effusion.  IMPRESSION: 1. NG tube tip projected over the upper stomach. Again advancement of approximately 10 cm is suggested to ensure that the proximal port lies below the GE junction. 2. Persistent but slightly improved small bowel distention. 3. Persistent left lower lobe atelectasis and/or  infiltrate with left pleural effusion.  These results will be called to the ordering clinician or representative by the Radiologist Assistant, and communication documented in the PACS or zVision Dashboard.   Electronically Signed   By: Marcello Moores  Register   On: 01/26/2015 07:29   Dg Abd 1 View  01/25/2015   CLINICAL DATA:  Abdominal pain, improving symptoms  EXAM: ABDOMEN - 1 VIEW  COMPARISON:  Supine abdominal film dated Jan 24, 2015  FINDINGS: There has been interval placement of a nasogastric tube. The proximal port lies at or just below the level of the GE junction. There remain loops of mildly distended gas-filled small bowel. The caliber of the colon is normal. There small amount of rectal gas. Bilateral double-J ureteral stents are unchanged in position. There is an aorto bi-iliac stent graft in place. There  are metallic coils in the pelvis likely from previous embolization procedures.  IMPRESSION: There has been slight interval increase in the volume of small bowel gas consistent with a mid to distal small bowel obstruction. Advancement of the nasogastric tube by 10 cm is recommended to assure that the proximal port lies below the GE junction.   Electronically Signed   By: David  Martinique M.D.   On: 01/25/2015 07:53   Dg Abd 1 View  01/24/2015   CLINICAL DATA:  Ileus.  EXAM: ABDOMEN - 1 VIEW  COMPARISON:  01/23/2015.  01/22/2015.  FINDINGS: Bilateral double-J ureteral stents noted. Prior aortoiliac stent graft placement with bilateral hypogastric coils. Surgical clips in the abdomen and pelvis. Dilated loops of small bowel noted. These have slightly improved. Colon is nondistended. Oral contrast is present in the colon. No free air. Vascular calcification. Left lower lobe atelectasis and/or infiltrate with small left pleural effusion. Cardiomegaly. Diffuse osteopenia. Degenerative changes lumbar spine and both hips.  IMPRESSION: 1. Dilated loops of small bowel noted. Small bowel obstruction cannot be excluded . Slight interim improvement. 2. Bilateral double-J ureteral stents in good anatomic position. 3. Aortoiliac stent graft noted. 4. Left lower lobe atelectasis and/or infiltrate with small left pleural effusion. 5. Cardiomegaly.   Electronically Signed   By: Marcello Moores  Register   On: 01/24/2015 08:27   Dg Abd 1 View  01/23/2015   CLINICAL DATA:  Abdominal pain, nausea, abdominal distension, ileus  EXAM: ABDOMEN - 1 VIEW  COMPARISON:  01/22/2015  FINDINGS: Pelvic embolization coils.  Aortoiliac stenting.  BILATERAL ureteral stents.  Persistent dilatation of small bowel loops question ileus versus small bowel obstruction, with slightly less small bowel gas and on the previous study.  No definite bowel wall thickening.  Bones diffusely demineralized.  IMPRESSION: Slightly decreased small bowel gaseous distention since  previous exam, question ileus versus small bowel obstruction.   Electronically Signed   By: Lavonia Dana M.D.   On: 01/23/2015 14:11   Dg Abd 1 View  01/22/2015   CLINICAL DATA:  74 year old male with a history of abdominal pain. Nausea  EXAM: ABDOMEN - 1 VIEW  COMPARISON:  01/21/2015, 01/18/2015  FINDINGS: Two frontal supine images.  Multiple distended small bowel loops, as was seen on the comparison study. Paucity of colonic gas. Small amount of retained enteric contrast within the left colon. The enteric contrast was not present on the comparison plain film study, and has progressed through the colon in the last 24 hours.  No definite evidence of free air.  Diffuse osteopenia.  No acute fracture.  Surgical changes of prior endograft repair of infrarenal abdominal aortic aneurysm with a Simeon Craft  excluder endograft. Changes of bilateral hypogastric artery coiling.  Wire-less pressure monitor to the left lateral aspect of the endograft.  Bilateral double-J ureteral stents in place.  IMPRESSION: Persisting dilated small bowel loops, either an ileus or small-bowel obstruction. If there is concern for acute intra abdominal process, CT would be indicated.  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 A wireless intrasac pressure monitor (CardioMEMES device) projects to the left aspect of the graft.  Unchanged position of bilateral double-J ureteral stents.  Signed,  Dulcy Fanny. Earleen Newport, DO  [facility]  [facility]   Electronically Signed   By: Corrie Mckusick D.O.   On: 01/22/2015 09:40   Dg Abd 1 View  01/21/2015   CLINICAL DATA:  Abdominal pain  EXAM: ABDOMEN - 1 VIEW  COMPARISON:  CT abdomen and pelvis January 18, 2015  FINDINGS: There are double-J stents bilaterally. There is also an aorto bi-iliac stent for aneurysm. There is generalized bowel dilatation in a pattern suggesting ileus. No free air is seen on this  supine examination. There are no air-fluid levels. There is atelectatic change in the lung bases. There are coils in the pelvis, presumably due to internal iliac artery embolization.  IMPRESSION: Bowel gas pattern most consistent with ileus. No free air seen. Double-J stents present as well as aortoiliac stent. Coils are present in the pelvis as well.   Electronically Signed   By: Lowella Grip III M.D.   On: 01/21/2015 10:12   Ct Chest Wo Contrast  01/26/2015   CLINICAL DATA:  Encephalopathy, shortness of breath  EXAM: CT CHEST WITHOUT CONTRAST  TECHNIQUE: Multidetector CT imaging of the chest was performed following the standard protocol without IV contrast.  COMPARISON:  None.  FINDINGS: The central airways are patent. There are small bilateral pleural effusions, left greater than right. There is no focal consolidation. There is mild bibasilar atelectasis. There is no pneumothorax.  There are no pathologically enlarged axillary, hilar or mediastinal lymph nodes.  The heart size is normal. There is a moderate pericardial effusion measuring 3.2 cm along the left ventricle. 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 There is multi vessel coronary artery atherosclerosis involving the left main, lad, circumflex and RCA.  Review of bone windows demonstrates no focal lytic or sclerotic lesions.  Limited non-contrast images of the upper abdomen were obtained. There are 2 hypodense nodules arising from the right adrenal gland measuring 14 mm and 18 mm respectively most consistent with adrenal adenomas. There is nodularity of the left adrenal gland. There is a nasogastric tube with the tip in the stomach. Partially visualized is left hydronephrosis.  IMPRESSION: 1. Bilateral small pleural effusions, left greater than right with bibasilar atelectasis. 2. Interval development of a moderate pericardial effusion measuring 3.2 cm along the left lateral ventricle. 3. Multi vessel  coronary artery atherosclerosis. 4. Distal aortic arch measures 4.7 cm in diameter. Recommend semi-annual imaging followup by CTA or MRA and referral to cardiothoracic surgery if not already obtained. This recommendation follows 2010 ACCF/AHA/AATS/ACR/ASA/SCA/SCAI/SIR/STS/SVM Guidelines for the Diagnosis and Management of Patients With Thoracic Aortic Disease. Circulation. 2010; 121: e266-e36   Electronically Signed   By: Kathreen Devoid   On: 01/26/2015 21:20   Dg Chest Port 1 View  01/18/2015   CLINICAL DATA:  74 year old male status post central line placement  EXAM: PORTABLE CHEST - 1 VIEW  COMPARISON:  CT scan chest/abdomen/ pelvis  obtained earlier today ; most recent prior chest x-ray 07/08/2014  FINDINGS: External defibrillator pad projects over the chest. Interval placement of a right IJ approach central venous catheter. The catheter is somewhat lateral in position. The tip is in the region of the distal SVC. Increased enlargement of the cardiopericardial silhouette. And atherosclerotic thoracic aorta. Pulmonary vascular congestion. All  IMPRESSION: 1. The tip of the right IJ approach central venous catheter is in the region of the distal SVC although it is slightly more lateral than expected. Recommend clinical correlation to insure that the catheter is within the venous system. 2. No evidence of pneumothorax or new pleural effusion. 3. Cardiomegaly. 4. Pulmonary vascular congestion, low lung volumes and bibasilar atelectasis.   Electronically Signed   By: Jacqulynn Cadet M.D.   On: 01/18/2015 21:22   Ct Angiography Chest/abd/pelvis ([facility] Hx)  01/18/2015   CLINICAL DATA:  Patient to ED via EMS from home with c.o respiratory distress, atrial fib, and groin pain.  EXAM: CT ANGIOGRAPHY CHEST, ABDOMEN AND PELVIS  TECHNIQUE: Multidetector CT imaging through the chest, abdomen and pelvis was performed using the standard protocol during bolus administration of intravenous contrast. Multiplanar reconstructed  images and MIPs were obtained and reviewed to evaluate the vascular anatomy.  CONTRAST:  100 mL of Omnipaque 350 intravenous contrast.  COMPARISON:  None.  FINDINGS: CTA CHEST FINDINGS  Thoracic aorta shows atherosclerotic irregularity along the arch and descending aorta. There is mild dilation of the thoracic aorta most prominently of the descending aorta where it measures 4.8 cm. No aortic dissection. The aortic branch vessels are widely patent. Mild atherosclerotic calcifications are noted the base of the left subclavian artery and along the innominate artery.  The heart is moderately enlarged. There are moderate coronary artery calcifications. No neck base or axillary masses or adenopathy. No mediastinal or hilar masses or adenopathy. There is lower lobe opacity, left greater than right, as well as dependent left upper lobe opacity, most likely all atelectasis. Pneumonia is not excluded but felt less likely. There is no pulmonary edema. 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 Minimal left pleural effusion. No pneumothorax.  Review of the MIP images confirms the above findings.  CTA ABDOMEN AND PELVIS FINDINGS  There is an excluded infrarenal abdominal aortic aneurysm. Native aneurysm measures 5.4 cm x 4.7 cm. The excluded lumen is widely patent. Stents extend into the external iliac arteries, also widely patent. There is dilation of the common femoral arteries. There is an aneurysm or pseudoaneurysm surrounding the right common femoral artery and proximal superficial femoral and deep femoral arteries, measuring 5.7 cm x 5.3 cm. There is no evidence of aortic rupture.  There is significant narrowing at the origin of the celiac axis, measuring 70%. The superior mesenteric artery is widely patent. Mild plaque is noted on the renal arteries without significant stenosis.  Liver, spleen, gallbladder, pancreas: Unremarkable. There is mild thickening of the adrenal glands suggesting nodular hyperplasia. This  is stable. There is significant bilateral hydronephrosis. Bilateral ureteral stents extend from the renal pelves to the bladder. There is bilateral renal cortical thinning. No convincing renal mass. Bladder mildly thick-walled. Is distended.  No discrete enlarged lymph node.  No abnormal fluid collections.  There are scattered colonic diverticula. No diverticulitis. No bowel wall thickening or mesenteric inflammation. There is no bowel dilation to suggest obstruction.  MUSCULOSKELETAL  Bones are demineralized. There are degenerative changes throughout the visualized spine. There are areas of sclerosis in the posterior aspect of the  T12 vertebrae with a small focus of sclerosis in the T7 vertebrae. T12 focus of sclerosis is chronic unchanged from the prior abdomen and pelvis CT. There are no fractures. No osteolytic lesions. There are arthropathic changes of both hip joints.  Review of the MIP images confirms the above findings.  IMPRESSION: 1. No evidence of aortic dissection or of a a rupture of the abdominal aortic aneurysm. 2. No acute vascular conclusion. 3. Dilation of the descending thoracic aorta. 4. Excluded infrarenal abdominal aortic aneurysm. The excluded portion of the aneurysm is widely patent. 5. 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 6. Bilateral significant hydronephrosis with bilateral ureteral stents which appear well-positioned. The degree of hydronephrosis and the ureteral stents are stable from the prior CT. 7. Mild bladder wall thickening. Consider cystitis in the proper clinical setting. 8. In the chest, there is moderate cardiomegaly and dependent atelectasis, greater on the left. No convincing pneumonia and no pulmonary edema.   Electronically Signed   By: Lajean Manes M.D.   On: 01/18/2015 18:41   US Kidneys ([facility] Hx)  01/19/2015   ADDENDUM REPORT: 01/19/2015 16:52  ADDENDUM: When directly comparing to recent CT, the  hydronephrosis in the right kidney is moderately improved, particularly in the interpolar region and lower pole collecting system. The hydronephrosis in the left kidney is mildly improved.  Notably, both kidneys are likely mildly improved when compared to CT dated 06/24/2014, indicating that the overall appearance is chronic.   Electronically Signed   By: Julian Hy M.D.   On: 01/19/2015 16:52   01/19/2015   CLINICAL DATA:  Bilateral hydronephrosis  EXAM: RENAL / URINARY TRACT ULTRASOUND COMPLETE  COMPARISON:  CT abdomen pelvis dated 01/18/2015  FINDINGS: Right Kidney:  Length: 11.7 cm. Moderate hydronephrosis. Indwelling ureteral stent.  Left Kidney:  Length: 11.9 cm. Moderate hydronephrosis. Indwelling ureteral stent.  Bladder:  Decompressed by indwelling Foley catheter.  IMPRESSION: Moderate hydronephrosis with indwelling ureteral stents bilaterally.  Bladder decompressed by indwelling Foley catheter.  Electronically Signed: By: Julian Hy M.D. On: 01/19/2015 16:23     Assessment and Plan  74 y.o. 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 1. New onset a-fib with RVR and hypotension: In setting of septic shock.  He is now back in NSR on amiodarone gtt.  - Continue amiodarone gtt, still has NGT and is NPO. - Holding anticoagulation for now given anemia - Would not use diltiazem given low EF.  2. Pericardial effusion: Moderate circumferential effusion on echo, no tamponade.  3. 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  EF 45-50% on echo this admission.  -He will need cardiac cath once  stable, including anemia work up which is ongoing.   -Lipitor 40 mg daily - Add low dose beta blocker (Coreg) if BP remains stable, maybe tomorrow. - Volume controlled by HD.  4. Septic shock/urosepsis: Enterobacter urosepsis.  Abx per primary service.  Hypotension resolved.  5. ESRD on HD 6. SBO: Still with few bowel sounds.   -NG tube in place  Loralie Champagne 01/29/2015 10:59 AM

## 2015-01-29 NOTE — Progress Notes (Signed)
Pt is alertx4. VSS. Complaints of sore throat. Medication given as needed. NG tube in place. Foley in place.  Pt turned as needed. Requested benadryl for sleep. Medication effective. Central line dressing to be changed in AM. Pt resting in bed. Will continue to monitor.

## 2015-01-29 NOTE — Consult Note (Signed)
Subjective: Patient seen for trace of blood noted in gastric aspirate. Patient up in chair at the time I saw him. He was much more and stated he was feeling better. There is no nausea or vomiting or abdominal pain. No bowel movement yet  Objective: Vital signs in last 24 hours: Temp:  [98 F (36.7 C)-98.7 F (37.1 C)] 98.1 F (36.7 C) (05/08 2037) Pulse Rate:  [84-85] 84 (05/08 2037) Resp:  [18-20] 18 (05/08 2037) BP: (98-103)/(56-63) 98/57 mmHg (05/08 2037) SpO2:  [98 %-100 %] 100 % (05/08 2037) Weight:  [95.9 kg (211 lb 6.7 oz)] 95.9 kg (211 lb 6.7 oz) (05/08 0328) Blood pressure 98/57, pulse 84, temperature 98.1 F (36.7 C), temperature source Oral, resp. rate 18, height 5' 6.97" (1.701 m), weight 95.9 kg (211 lb 6.7 oz), SpO2 100 %.   Intake/Output from previous day: 05/07 0701 - 05/08 0700 In: 1814.7 [I.V.:980; TPN:834.7] Out: 950 [Urine:850; Emesis/NG output:100]  Intake/Output this shift:     General appearance:  A 74 year old African-American male no acute distress Resp:  Clear to auscultation Cardio:  Regular rate and rhythm GI:  Soft bowel sounds positive/distant minimal distention nontender Extremities:  no clubbing no clubbing cyanosis or edema   Lab Results: Results for orders placed or performed during the hospital encounter of 01/18/15 (from the past 24 hour(s))  Glucose, capillary     Status: Abnormal   Collection Time: 01/29/15  2:06 AM  Result Value Ref Range   Glucose-Capillary 115 (H) 70 - 99 mg/dL  Basic metabolic panel     Status: Abnormal   Collection Time: 01/29/15  6:33 AM  Result Value Ref Range   Sodium 139 135 - 145 mmol/L   Potassium 3.6 3.5 - 5.1 mmol/L   Chloride 101 101 - 111 mmol/L   CO2 30 22 - 32 mmol/L   Glucose, Bld 115 (H) 65 - 99 mg/dL   BUN 20 6 - 20 mg/dL   Creatinine, Ser 4.64 (H) 0.61 - 1.24 mg/dL   Calcium 7.5 (L) 8.9 - 10.3 mg/dL   GFR calc non Af Amer 11 (L) >60 mL/min   GFR calc Af Amer 13 (L) >60 mL/min   Anion gap 8  5 - 15  CBC     Status: Abnormal   Collection Time: 01/29/15  6:33 AM  Result Value Ref Range   WBC 10.9 (H) 3.8 - 10.6 K/uL   RBC 2.64 (L) 4.40 - 5.90 MIL/uL   Hemoglobin 7.5 (L) 13.0 - 18.0 g/dL   HCT 23.3 (L) 40.0 - 52.0 %   MCV 88.6 80.0 - 100.0 fL   MCH 28.5 26.0 - 34.0 pg   MCHC 32.2 32.0 - 36.0 g/dL   RDW 18.0 (H) 11.5 - 14.5 %   Platelets 236 150 - 440 K/uL  Magnesium     Status: None   Collection Time: 01/29/15  6:33 AM  Result Value Ref Range   Magnesium 1.7 1.7 - 2.4 mg/dL  Phosphorus     Status: Abnormal   Collection Time: 01/29/15  6:33 AM  Result Value Ref Range   Phosphorus 2.1 (L) 2.5 - 4.6 mg/dL  Glucose, capillary     Status: Abnormal   Collection Time: 01/29/15  7:42 AM  Result Value Ref Range   Glucose-Capillary 128 (H) 70 - 99 mg/dL  Glucose, capillary     Status: Abnormal   Collection Time: 01/29/15 12:14 PM  Result Value Ref Range   Glucose-Capillary 124 (H) 70 - 99  mg/dL  Glucose, capillary     Status: Abnormal   Collection Time: 01/29/15  4:32 PM  Result Value Ref Range   Glucose-Capillary 125 (H) 70 - 99 mg/dL  Glucose, capillary     Status: Abnormal   Collection Time: 01/29/15  8:34 PM  Result Value Ref Range   Glucose-Capillary 110 (H) 70 - 99 mg/dL      Recent Labs  01/27/15 0500 01/28/15 0454 01/29/15 0633  WBC 11.3* 12.6* 10.9*  HGB 6.9* 7.7* 7.5*  HCT 21.6* 23.1* 23.3*  PLT 259 258 236   BMET  Recent Labs  01/27/15 0500 01/28/15 0454 01/29/15 0633  NA 140 139 139  K 3.1* 2.8* 3.6  CL 97* 98* 101  CO2 30 31 30   GLUCOSE 97 118* 115*  BUN 26* 34* 20  CREATININE 5.61* 6.72* 4.64*  CALCIUM 7.5* 7.5* 7.5*   LFT No results for input(s): PROT, ALBUMIN, AST, ALT, ALKPHOS, BILITOT, BILIDIR, IBILI in the last 72 hours. PT/INR No results for input(s): LABPROT, INR in the last 72 hours. Hepatitis Panel No results for input(s): HEPBSAG, HCVAB, HEPAIGM, HEPBIGM in the last 72 hours. C-Diff No results for input(s): CDIFFTOX in  the last 72 hours. No results for input(s): CDIFFPCR in the last 72 hours.   Studies/Results: No results found.  Scheduled Inpatient Medications:   . atorvastatin  40 mg Oral Daily  . ciprofloxacin  400 mg Intravenous Q24H  . insulin aspart  0-9 Units Subcutaneous Q6H  . pantoprazole (PROTONIX) IV  40 mg Intravenous Q24H  . potassium phosphate IVPB (mmol)  15 mmol Intravenous Once  . sodium chloride  10-40 mL Intracatheter Q12H  . sulfamethoxazole-trimethoprim  1 tablet Oral Q12H    Continuous Inpatient Infusions:   . amiodarone 30 mg/hr (01/27/15 0513)  . TPN (CLINIMIX) Adult without lytes 40 mL/hr at 01/29/15 1835    PRN Inpatient Medications:  acetaminophen, diphenhydrAMINE, ondansetron (ZOFRAN) IV, phenol, sodium chloride, sodium chloride  Miscellaneous:   Assessment:  1. No further evidence of blood in his NG tube. He has been hemodynamically stable and hemoglobin has been stable as well. 2. Ileus. Last film 2 days ago  Plan:  1. Recheck abdominal x-ray in a.m. If ileus is improving particularly if he is starting to pass stool or flatus, wouldcap NG tube from wall suction. Continue PPI. Following with you.  Lollie Sails MD 01/29/2015, 9:19 PM

## 2015-01-29 NOTE — Progress Notes (Signed)
[facility]  ROUNDING NOTE   Subjective:   Hemodialysis yesterday. Tolerated treatment well. No UF.  Bowel movement last night.  GI and Urology have been to see patient.  Objective:  Vital signs in last 24 hours:  Temp:  [97.7 F (36.5 C)-98.4 F (36.9 C)] 98 F (36.7 C) (05/08 0412) Pulse Rate:  [76-87] 85 (05/08 0412) Resp:  [17-24] 20 (05/08 0412) BP: (101-123)/(56-78) 103/56 mmHg (05/08 0412) SpO2:  [97 %-100 %] 98 % (05/08 0412) Weight:  [95.9 kg (211 lb 6.7 oz)-100 kg (220 lb 7.4 oz)] 95.9 kg (211 lb 6.7 oz) (05/08 0328)  Weight change: 4.382 kg (9 lb 10.6 oz) Filed Weights   01/28/15 1040 01/28/15 1400 01/29/15 0328  Weight: 100 kg (220 lb 7.4 oz) 100 kg (220 lb 7.4 oz) 95.9 kg (211 lb 6.7 oz)    Intake/Output: I/O last 3 completed shifts: In: 1814.7 [I.V.:980] Out: 1400 [Urine:850; Emesis/NG output:550]   Intake/Output this shift:     General: not in acute distress Head: +NGT to suction Neck: supple, RIJ central line CVS: regular rate and rhythm Resp: clear  ABD: +soft, +bowel sounds EXT: no edema Neuro: alert and oriented Access: Left arm AVF   Basic Metabolic Panel:  Recent Labs Lab 01/23/15 1445 01/24/15 0940 01/25/15 1246 01/26/15 0510 01/27/15 0500 01/28/15 0454 01/29/15 0633  NA 143  --  142 141 140 139 139  K 4.1  --  3.7 3.3* 3.1* 2.8* PENDING  CL 101  --  100* 100* 97* 98* 101  CO2 24  --  27 28 30 31 30   GLUCOSE 76  --  86 87 97 118* 115*  BUN 56*  --  34* 41* 26* 34* 20  CREATININE 9.87*  --  7.06* 8.00* 5.61* 6.72* 4.64*  CALCIUM 8.0*  --  7.7* 7.4* 7.5* 7.5* 7.5*  MG  --  2.2  --   --  1.8 1.8 1.7  PHOS 7.0*  --  5.4* 6.2*  --  4.2 2.1*    Liver Function Tests:  Recent Labs Lab 01/23/15 1445 01/25/15 1246 01/26/15 0510  ALBUMIN 2.1* 2.3* 2.1*   No results for input(s): LIPASE, AMYLASE in the last 168 hours. No results for input(s): AMMONIA in the last 168 hours.  CBC:  Recent Labs Lab 01/25/15 1246  01/26/15 0510 01/26/15 1745 01/27/15 0500 01/28/15 0454 01/29/15 0633  WBC 9.5 8.9  --  11.3* 12.6* 10.9*  HGB 7.4* 6.3* 7.5* 6.9* 7.7* 7.5*  HCT 23.6* 19.4* 23.3* 21.6* 23.1* 23.3*  MCV 89.2 89.6  --  88.8 87.8 88.6  PLT 302 258  --  259 258 236    Cardiac Enzymes: No results for input(s): CKTOTAL, CKMB, CKMBINDEX, TROPONINI in the last 168 hours.  BNP: Invalid input(s): POCBNP  CBG:  Recent Labs Lab 01/28/15 1450 01/28/15 1724 01/28/15 2001 01/29/15 0206 01/29/15 0742  GLUCAP 111* 142* 125* 115* 128*    Microbiology: Results for orders placed or performed during the hospital encounter of 01/18/15  Culture, blood (single)     Status: None (Preliminary result)   Collection Time: 01/18/15  7:11 PM  Result Value Ref Range Status   Micro Text Report   Preliminary       COMMENT                   NO GROWTH IN 48 HOURS   ANTIBIOTIC  Culture, blood (single)     Status: None (Preliminary result)   Collection Time: 01/18/15  7:11 PM  Result Value Ref Range Status   Micro Text Report   Preliminary       COMMENT                   NO GROWTH IN 48 HOURS   ANTIBIOTIC                                                      Urine culture     Status: None   Collection Time: 01/18/15  7:36 PM  Result Value Ref Range Status   Micro Text Report   Final       SOURCE: INDWELLING CATHETER    COMMENT                   NO GROWTH IN 36 HOURS   ANTIBIOTIC                                                      Urine culture     Status: None   Collection Time: 01/20/15  4:09 PM  Result Value Ref Range Status   Micro Text Report   Final       SOURCE: INDWELLING CATH    ORGANISM 1                2000 CFU/ML ENTEROBACTER SPECIES   COMMENT                   UNABLE TO FURTHER SPECIATE   COMMENT                   -   ANTIBIOTIC                    ORG#1     CEFAZOLIN                     R         CEFOXITIN                     R          CEFTRIAXONE                   R         CIPROFLOXACIN                 I         ERTAPENEM                     S         GENTAMICIN                    S         IMIPENEM                      S         LEVOFLOXACIN  I         NITROFURANTOIN                I         TRIMETH/SULFA                 S             Coagulation Studies: No results for input(s): LABPROT, INR in the last 72 hours.  Urinalysis: No results for input(s): COLORURINE, LABSPEC, PHURINE, GLUCOSEU, HGBUR, BILIRUBINUR, KETONESUR, PROTEINUR, UROBILINOGEN, NITRITE, LEUKOCYTESUR in the last 72 hours.  Invalid input(s): APPERANCEUR    Imaging: Dg Abd 1 View  01/27/2015   CLINICAL DATA:  Ileus, abdominal pain  EXAM: ABDOMEN - 1 VIEW  COMPARISON:  01/26/2015  FINDINGS: Tip of nasogastric tube projects over gastric antrum.  Prior endoluminal stenting of abdominal aorta and iliac arteries.  BILATERAL ureteral stents present.  Dilatation of small bowel loops without bowel wall thickening.  Scattered gas within nondistended colon and rectum.  Bones diffusely demineralized.  Atelectasis versus consolidation LEFT lower lobe.  IMPRESSION: Diffuse small bowel dilatation, question ileus though obstruction not excluded ; radiographic followup as clinically indicated.  Atelectasis versus consolidation LEFT lower lobe.   Electronically Signed   By: Lavonia Dana M.D.   On: 01/27/2015 11:10     Medications:   . amiodarone 30 mg/hr (01/27/15 0513)  . TPN (CLINIMIX) Adult without lytes 40 mL/hr at 01/28/15 1818  . TPN (CLINIMIX) Adult without lytes     . atorvastatin  40 mg Oral Daily  . ciprofloxacin  400 mg Intravenous Q24H  . insulin aspart  0-9 Units Subcutaneous Q6H  . pantoprazole (PROTONIX) IV  40 mg Intravenous Q24H  . sodium chloride  10-40 mL Intracatheter Q12H  . sulfamethoxazole-trimethoprim  1 tablet Oral Q12H   acetaminophen, diphenhydrAMINE, ondansetron (ZOFRAN) IV, phenol, sodium chloride, sodium  chloride  Assessment/ Plan:  74 y.o. 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 Hemodialysis yesterday. Tolerated treatment well.   - below dry weight due to GI losses.  - Continue TTS schedule.   - potassium low, potassium level for today is pending.   2. Anemia of chronic kidney disease: D63.1 hgb 7.5 - holding epo due to prostate cancer.  - transfusion on 5/5 and 5/6   3. Secondary Hyperparathyroidism: N25.81 phos at goal. Currently NPO and not on binders. On sevelamer as outpatient.  Phos 2.1 - Currently on TPN, will monitor phos level.   4. Ileus: K56.7 NGT to suction. Continue supportive care. Appreciate surgery input. - placed on TPN - bowel movement yesterday.   5. A. Fib with RVR: I48.91 converted to sinus rhythm, on amiodarone. - Appreciate cards input.   6. Hypertension: well controlled. Currently holding amlodipine.   7. Urinary tract Infection: now switched to trimethoprim/sulfamethoxazole.      LOS: 11 Ronson Hagins 5/8/201610:46 AM

## 2015-01-29 NOTE — Consult Note (Addendum)
Subjective: I was asked to see [name] by Dr. Manuella Ghazi for urosepsis.   [name] is a former patient of Dr. 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  He has resonance stents which are supposed to be good for a year.   He is being managed with Lupron and is due for a dose per his report.   He also has a history of bladder cancer with prior BCG and he is overdue for cystoscopy.  He was admitted on 4/27 with afib and abdominal distention with UTI and sepsis.  He has ESRD and is on dialysis.   He had a foley placed on admission and has an approximate daily UOP of 300-900 ml.   He had a CT on 5/6 that showed some left hydronephrosis but it wasn't completely visualized and appeared chronic.   The Urine culture on 4/29 grew enterobacter.  He had TNTC WBC with TNTC RBC's which is not surprising with the stents.   ROS:  Review of Systems  Constitutional: Negative for fever and chills.  HENT: Negative.   Eyes: Negative.   Respiratory: Negative.   Cardiovascular: Negative.   Gastrointestinal: Negative.  Negative for constipation.  Genitourinary:       He is tolerating the foley well but prior to placement he was voiding incontinently.   Musculoskeletal: Negative.   Skin: Negative.   Neurological: Negative.   Endo/Heme/Allergies: Negative.   Psychiatric/Behavioral: Negative.    Allergies  Allergen Reactions  . Penicillins Hives    Past Medical History  Diagnosis Date  . PAF (paroxysmal atrial fibrillation)     a. new onset 12/2014 in the setting of UTI, sepsis, hypotension, and anemia; b. not on long term anticoagulation given anemia; c. family aware of stroke risk, they are ok with this; d. on amiodarone   . Chronic combined systolic and diastolic CHF (congestive heart  failure)     a. echo 12/2014: EF 25-30%, anterior wall wall motion abnormalities; b. planned ischemic evaluation once patient is stable medically  . ESRD on hemodialysis     a. Tuesday, Thursday, and Saturdays  . Anemia     a. baseline hgb ~ 8  . History of small bowel obstruction     a. 01/2015    Past Surgical History  Procedure Laterality Date  . Cystoscopy w/ ureteral stent placement    . Transurethral resection of bladder tumor with gyrus (turbt-gyrus)      History   Social History  . Marital Status: Single    Spouse Name: N/A  . Number of Children: N/A  . Years of Education: N/A   Occupational History  . Not on file.   Social History Main Topics  . Smoking status: Former Smoker -- 96 years    Quit date: 01/11/2015  . Smokeless tobacco: Not on file  . Alcohol Use: No  . Drug Use: No  . Sexual Activity: Not on file   Other Topics Concern  . Not on file   Social History Narrative    History reviewed. No pertinent family history.  Anti-infectives: Anti-infectives    Start     Dose/Rate Route Frequency Ordered Stop   01/27/15 2200  sulfamethoxazole-trimethoprim (BACTRIM DS,SEPTRA DS) 800-160 MG per tablet 1 tablet     1  tablet Oral Every 12 hours 01/27/15 1610     01/27/15 1800  ciprofloxacin (CIPRO) tablet 500 mg  Status:  Discontinued    Comments:  Patient started Ciprofloxacin on 5/3.   500 mg Oral Daily 01/27/15 0812 01/27/15 1446   01/27/15 1800  ciprofloxacin (CIPRO) IVPB 400 mg    Comments:  Patient started Ciprofloxacin on 5/3.   400 mg 200 mL/hr over 60 Minutes Intravenous Every 24 hours 01/27/15 1446     01/24/15 1800  ciprofloxacin (CIPRO) IVPB 400 mg  Status:  Discontinued     400 mg 200 mL/hr over 60 Minutes Intravenous Every 24 hours 01/24/15 1521 01/27/15 0812   01/24/15 1200  vancomycin (VANCOCIN) IVPB 1000 mg/200 mL premix  Status:  Discontinued     1,000 mg 200 mL/hr over 60 Minutes Intravenous Every T-Th-Sa (Hemodialysis) 01/21/15 1551  01/24/15 1520   01/22/15 2100  meropenem (MERREM) 1 g in sodium chloride 0.9 % 100 mL IVPB  Status:  Discontinued     1 g 200 mL/hr over 30 Minutes Intravenous Every 24 hours 01/21/15 1219 01/22/15 1336   01/22/15 2100  meropenem (MERREM) 500 mg in sodium chloride 0.9 % 50 mL IVPB  Status:  Discontinued     500 mg 100 mL/hr over 30 Minutes Intravenous Every 24 hours 01/22/15 1336 01/24/15 1520   01/22/15 0000  vancomycin (VANCOCIN) IVPB 1000 mg/200 mL premix  Status:  Discontinued     1,000 mg 200 mL/hr over 60 Minutes Intravenous Every Dialysis 01/21/15 1437 01/21/15 1551      Current Facility-Administered Medications  Medication Dose Route Frequency Provider Last Rate Last Dose  . acetaminophen (TYLENOL) tablet 650 mg  650 mg Oral Q4H PRN Epifanio Lesches, MD      . amiodarone (NEXTERONE PREMIX) 360 MG/200ML (1.8 mg/mL) IV infusion  30 mg/hr Intravenous Continuous Minna Merritts, MD 16.7 mL/hr at 01/27/15 0513 30 mg/hr at 01/27/15 0513  . atorvastatin (LIPITOR) tablet 40 mg  40 mg Oral Daily Doctor Chlconversion, MD   40 mg at 01/29/15 0913  . ciprofloxacin (CIPRO) IVPB 400 mg  400 mg Intravenous Q24H Epifanio Lesches, MD   400 mg at 01/28/15 1724  . diphenhydrAMINE (BENADRYL) capsule 25 mg  25 mg Oral QHS PRN Lytle Butte, MD   25 mg at 01/28/15 2225  . insulin aspart (novoLOG) injection 0-9 Units  0-9 Units Subcutaneous Q6H Vaughan Basta, MD   1 Units at 01/28/15 2017  . ondansetron (ZOFRAN) injection 4 mg  4 mg Intravenous Q4H PRN Doctor Chlconversion, MD      . pantoprazole (PROTONIX) injection 40 mg  40 mg Intravenous Q24H Lollie Sails, MD   40 mg at 01/28/15 1818  . phenol (CHLORASEPTIC) mouth spray 1 spray  1 spray Mouth/Throat Q2H PRN Doctor Chlconversion, MD   1 spray at 01/28/15 2020  . sodium chloride 0.9 % injection 10-40 mL  10-40 mL Intracatheter Q12H Vaughan Basta, MD   10 mL at 01/29/15 1000  . sodium chloride 0.9 % injection 10-40 mL  10-40 mL  Intracatheter PRN Vaughan Basta, MD   10 mL at 01/26/15 0949  . sodium chloride 0.9 % injection 3-6 mL  3-6 mL Intravenous PRN Doctor Chlconversion, MD      . sulfamethoxazole-trimethoprim (BACTRIM DS,SEPTRA DS) 800-160 MG per tablet 1 tablet  1 tablet Oral Q12H Adrian Prows, MD   1 tablet at 01/29/15 0913  . TPN (CLINIMIX) Adult without lytes   Intravenous Continuous TPN Epifanio Lesches,  MD 40 mL/hr at 01/28/15 1818    . TPN (CLINIMIX) Adult without lytes   Intravenous Continuous TPN Vaughan Basta, MD       PSFH reviewed.   Objective: Vital signs in last 24 hours: Temp:  [97.7 F (36.5 C)-98.4 F (36.9 C)] 98 F (36.7 C) (05/08 0412) Pulse Rate:  [76-87] 85 (05/08 0412) Resp:  [17-24] 20 (05/08 0412) BP: (101-123)/(56-78) 103/56 mmHg (05/08 0412) SpO2:  [97 %-100 %] 98 % (05/08 0412) Weight:  [95.9 kg (211 lb 6.7 oz)-100 kg (220 lb 7.4 oz)] 95.9 kg (211 lb 6.7 oz) (05/08 0328)  Intake/Output from previous day: 05/07 0701 - 05/08 0700 In: 1814.7 [I.V.:980; TPN:834.7] Out: 950 [Urine:850; Emesis/NG output:100] Intake/Output this shift:     Physical Exam  Constitutional: He is oriented to person, place, and time.  HENT:  Head: Normocephalic and atraumatic.  Neck: Normal range of motion. Neck supple. No thyromegaly present.  Cardiovascular: Normal rate, regular rhythm and normal heart sounds.   Pulmonary/Chest: Effort normal and breath sounds normal. No respiratory distress.  Abdominal: Soft. Bowel sounds are normal. He exhibits no distension and no mass. There is no tenderness.  Genitourinary:  Normal phallus with indwelling foley draining clear urine.  Scrotum is unremarkable with atrophied testes.   Musculoskeletal: Normal range of motion.  Neurological: He is alert and oriented to person, place, and time.  Skin: Skin is warm and dry.  Psychiatric: Mood and affect normal.  Vitals reviewed.   Lab Results:   Recent Labs  01/28/15 0454  01/29/15 0633  WBC 12.6* 10.9*  HGB 7.7* 7.5*  HCT 23.1* 23.3*  PLT 258 236   BMET  Recent Labs  01/28/15 0454 01/29/15 0633  NA 139 139  K 2.8* PENDING  CL 98* 101  CO2 31 30  GLUCOSE 118* 115*  BUN 34* 20  CREATININE 6.72* 4.64*  CALCIUM 7.5* 7.5*   PT/INR No results for input(s): LABPROT, INR in the last 72 hours. ABG No results for input(s): PHART, HCO3 in the last 72 hours.  Invalid input(s): PCO2, PO2  Studies/Results: Dg Abd 1 View  01/27/2015   CLINICAL DATA:  Ileus, abdominal pain  EXAM: ABDOMEN - 1 VIEW  COMPARISON:  01/26/2015  FINDINGS: Tip of nasogastric tube projects over gastric antrum.  Prior endoluminal stenting of abdominal aorta and iliac arteries.  BILATERAL ureteral stents present.  Dilatation of small bowel loops without bowel wall thickening.  Scattered gas within nondistended colon and rectum.  Bones diffusely demineralized.  Atelectasis versus consolidation LEFT lower lobe.  IMPRESSION: Diffuse small bowel dilatation, question ileus though obstruction not excluded ; radiographic followup as clinically indicated.  Atelectasis versus consolidation LEFT lower lobe.   Electronically Signed   By: Lavonia Dana M.D.   On: 01/27/2015 11:10   I have reviewed the hospital notes, labs and films.     Assessment: Urosepsis with a complicated urologic history including Metastatic prostate cancer with bilateral ureteral obstruction and a history of bladder cancer.  He has bilateral ureteral stents that were last changed in 7/15.   He is currently afebrile without signs of sepsis.   ESRD on Dialysis.     Plan: I will notify Dr. Erlene Quan of his admission so that she can determine the need for his next lupron injection and cystoscopy for bladder cancer surveillance and stent exchange.  PSA and renal US ordered.    Continue foley catheter drainage.    CC: Dr. Max Sane and Dr. Hollice Espy.  LOS: 11 days    Xaniyah Buchholz J 01/29/2015

## 2015-01-29 NOTE — Progress Notes (Signed)
PARENTERAL NUTRITION CONSULT NOTE - INITIAL  Pharmacy Consult for Electrolyte Supplementation/Glucose Management Indication: TPN   Allergies  Allergen Reactions  . Penicillins Hives    Patient Measurements: Height: 5' 6.97" (170.1 cm) Weight: 211 lb 6.7 oz (95.9 kg) IBW/kg (Calculated) : 66.03   Vital Signs: Temp: 98.7 F (37.1 C) (05/08 1212) Temp Source: Oral (05/08 1212) BP: 98/63 mmHg (05/08 1212) Pulse Rate: 85 (05/08 0412) Intake/Output from previous day: 05/07 0701 - 05/08 0700 In: 1814.7 [I.V.:980; TPN:834.7] Out: 950 [Urine:850; Emesis/NG output:100] Intake/Output from this shift:    Labs:  Recent Labs  01/27/15 0500 01/28/15 0454 01/29/15 0633  WBC 11.3* 12.6* 10.9*  HGB 6.9* 7.7* 7.5*  HCT 21.6* 23.1* 23.3*  PLT 259 258 236     Recent Labs  01/27/15 0500 01/28/15 0454 01/28/15 0456 01/29/15 0633  NA 140 139  --  139  K 3.1* 2.8*  --  3.6  CL 97* 98*  --  101  CO2 30 31  --  30  GLUCOSE 97 118*  --  115*  BUN 26* 34*  --  20  CREATININE 5.61* 6.72*  --  4.64*  CALCIUM 7.5* 7.5*  --  7.5*  MG 1.8 1.8  --  1.7  PHOS  --  4.2  --  2.1*  PREALBUMIN  --   --  7.1*  --   TRIG  --  72  --   --    Estimated Creatinine Clearance: 15.6 mL/min (by C-G formula based on Cr of 4.64).    Recent Labs  01/29/15 0206 01/29/15 0742 01/29/15 1214  GLUCAP 115* 128* 124*    Medical History: Past Medical History  Diagnosis Date  . PAF (paroxysmal atrial fibrillation)     a. new onset 12/2014 in the setting of UTI, sepsis, hypotension, and anemia; b. not on long term anticoagulation given anemia; c. family aware of stroke risk, they are ok with this; d. on amiodarone   . Chronic combined systolic and diastolic CHF (congestive heart failure)     a. echo 12/2014: EF 25-30%, anterior wall wall motion abnormalities; b. planned ischemic evaluation once patient is stable medically  . ESRD on hemodialysis     a. Tuesday, Thursday, and Saturdays  . Anemia     a. baseline hgb ~ 8  . History of small bowel obstruction     a. 01/2015    Medications:  Scheduled:  . atorvastatin  40 mg Oral Daily  . ciprofloxacin  400 mg Intravenous Q24H  . insulin aspart  0-9 Units Subcutaneous Q6H  . pantoprazole (PROTONIX) IV  40 mg Intravenous Q24H  . potassium phosphate IVPB (mmol)  15 mmol Intravenous Once  . sodium chloride  10-40 mL Intracatheter Q12H  . sulfamethoxazole-trimethoprim  1 tablet Oral Q12H   Infusions:  . amiodarone 30 mg/hr (01/27/15 0513)  . TPN (CLINIMIX) Adult without lytes 40 mL/hr at 01/28/15 1818  . TPN (CLINIMIX) Adult without lytes      Insulin Requirements in the past 24 hours:  2 units SSI given.    Assessment: Patient is a 74 yo male with ESRD requiring dialysis.  Dialysis schedule is TTHS.  Patient with ileus requiring TPN.  Currently ordered Clinimix 5/15 no E at 40 mL/hr.  Goal rate of  80 mL/hr.   5/8 Phos = 2.1.  Plan:  Ordered Potassium Phosphate 11mmol IV x 1 dose.  Labs ordered in AM.  Pharmacy will continue to follow.  Olivia Canter, RPh  Clinical Pharmacist 01/29/2015

## 2015-01-29 NOTE — Progress Notes (Signed)
[facility] NAME: [name]    MR#:  [id number]  DATE OF BIRTH:  01-19-1941  SUBJECTIVE: Feels some what better. No fever.  CHIEF COMPLAINT:  Confusion and shortness of breath.  REVIEW OF SYSTEMS:  CONSTITUTIONAL: No fever, fatigue or weakness.  EYES: No blurred or double vision.  EARS, NOSE, AND THROAT: No tinnitus or ear pain.  RESPIRATORY: No cough, shortness of breath, wheezing or hemoptysis.  CARDIOVASCULAR: No chest pain, orthopnea, edema.  GASTROINTESTINAL: No nausea, vomiting, diarrhea or abdominal pain.  GENITOURINARY: No dysuria, hematuria.  ENDOCRINE: No polyuria, nocturia,  HEMATOLOGY: No anemia, easy bruising or bleeding SKIN: No rash or lesion. MUSCULOSKELETAL: No joint pain or arthritis.   NEUROLOGIC: No tingling, numbness, weakness.  PSYCHIATRY: No anxiety or depression.   DRUG ALLERGIES:   Allergies  Allergen Reactions  . Penicillins Hives    VITALS:  Blood pressure 103/56, pulse 85, temperature 98 F (36.7 C), temperature source Oral, resp. rate 20, height 5' 6.97" (1.701 m), weight 95.9 kg (211 lb 6.7 oz), SpO2 98 %.  PHYSICAL EXAMINATION:  GENERAL:  74 y.o.-year-old patient lying in the bed with no acute distress.  EYES: Pupils equal, round, reactive to light and accommodation. No scleral icterus. Extraocular muscles intact.  HEENT: Head atraumatic, normocephalic. Oropharynx and nasopharynx clear.  NECK:  Supple, no jugular venous distention. No thyroid enlargement, no tenderness.  LUNGS: Normal breath sounds bilaterally, no wheezing, rales,rhonchi or crepitation. No use of accessory muscles of respiration.  CARDIOVASCULAR: S1, S2 normal. No murmurs, rubs, or gallops.  ABDOMEN: Soft, nontender, nondistended. Bowel sounds present. No organomegaly or mass.  EXTREMITIES: No pedal edema, cyanosis, or clubbing.  NEUROLOGIC: Cranial nerves II through XII are intact. Muscle strength 5/5 in all  extremities. Sensation intact. Gait not checked.  PSYCHIATRIC: The patient is alert and oriented x 3.  SKIN: No obvious rash, lesion, or ulcer.    LABORATORY PANEL:   CBC  Recent Labs Lab 01/29/15 0633  WBC 10.9*  HGB 7.5*  HCT 23.3*  PLT 236   ------------------------------------------------------------------------------------------------------------------  Chemistries   Recent Labs Lab 01/29/15 0633  NA 139  K PENDING  CL 101  CO2 30  GLUCOSE 115*  BUN 20  CREATININE 4.64*  CALCIUM 7.5*  MG 1.7   ------------------------------------------------------------------------------------------------------------------  ---------------------------------------------------------------------------------------------------------------   ASSESSMENT AND PLAN:  41 m with Bladder/prostate cancer, ESRD on HD, hypertension here with confusion, SOB  * New onset Afib with RVR: due to sepsis, now back in NSR on amio drip. Still NPO and has NGT. - Holding anticoagulation for now given anemia - No diltiazem given low EF per cardio  * ileus: NPO for now, NGT suction since 01/22/15. Still no BM orpassing gas. Appreciate surgical consult. conservative mgmt for now. As no oral intake for 6 days- Dietary involved to start TPN.  * Septic shock due to UTI  Had ureteral stents- seen by ID, will c/s urology to see if stents will need to be replaced per ID.  BP stable.  on bactrim - can stop on 5/16 per ID.  repeated cx just 2000CFU gr neg rods. Has ureteral stents and Hx of prostate cancer- may need stent removal and to continue foley - per urology, advised to follow in office.  * GI bleed  Have Occult blood positive in Gastric secretions, and required 2 Units of blood transfusion on last 3 days.  Appreciate GI c/s  * ESRD on HD  manage per  nephrology  * Anemia of Chronic Disease with worsening due to GI loss Transfused 1 unit PRBC 01/26/15 and 01/26/14.  Target Hb should be  around 8 per cardiology.   * Pericardial effusion  Cardio on case- moderate Effusion per echo, but no temponade. EF is 45%  * Acute resp failure on admission Aspiration? Monitor.  Now on room air.  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 EF 45-50% on echo this admission.  -He will need cardiac cath once stable, including anemia work up which is ongoing.  -Lipitor 40 mg daily - can add low dose beta blocker (Coreg) if BP remains stable, maybe tomorrow. - Volume controlled by HD.    Overall prognosis is very poor due to multiple medical issues Spoke to Pt and his POA- [name] ( cousin)- they agreed to meet palliative care team to help decide.   All the records are reviewed and case discussed with nurse. Management plans discussed with the patient, family and they are in agreement.  CODE STATUS: Full Code  TOTAL TIME TAKING CARE OF THIS PATIENT: 35 minutes.   POSSIBLE D/C IN 3-4 DAYS, DEPENDING ON CLINICAL CONDITION.    [facility], [place] Markgraf M.D on 01/29/2015 at 11:14 AM  Between 7am to 6pm - Pager - [phone]  After 6pm go to [email or web address] - password [email or web address] [facility]  Office  [phone]  CC: Primary care physician; Dion Body, MD

## 2015-01-30 ENCOUNTER — Inpatient Hospital Stay: Payer: Medicare Other

## 2015-01-30 LAB — TYPE AND SCREEN
ABO/RH(D): A NEG
Antibody Screen: NEGATIVE
Unit division: 0
Unit division: 0

## 2015-01-30 LAB — CBC
HEMATOCRIT: 22.3 % — AB (ref 40.0–52.0)
Hemoglobin: 7.5 g/dL — ABNORMAL LOW (ref 13.0–18.0)
MCH: 29.6 pg (ref 26.0–34.0)
MCHC: 33.6 g/dL (ref 32.0–36.0)
MCV: 87.9 fL (ref 80.0–100.0)
PLATELETS: 208 10*3/uL (ref 150–440)
RBC: 2.54 MIL/uL — ABNORMAL LOW (ref 4.40–5.90)
RDW: 17.9 % — AB (ref 11.5–14.5)
WBC: 10.6 10*3/uL (ref 3.8–10.6)

## 2015-01-30 LAB — MAGNESIUM: Magnesium: 1.5 mg/dL — ABNORMAL LOW (ref 1.7–2.4)

## 2015-01-30 LAB — BASIC METABOLIC PANEL
Anion gap: 9 (ref 5–15)
BUN: 28 mg/dL — AB (ref 6–20)
CHLORIDE: 101 mmol/L (ref 101–111)
CO2: 27 mmol/L (ref 22–32)
CREATININE: 6.29 mg/dL — AB (ref 0.61–1.24)
Calcium: 7.4 mg/dL — ABNORMAL LOW (ref 8.9–10.3)
GFR calc Af Amer: 9 mL/min — ABNORMAL LOW (ref >60)
GFR, EST NON AFRICAN AMERICAN: 8 mL/min — AB (ref >60)
GLUCOSE: 111 mg/dL — AB (ref 65–99)
Potassium: 3.5 mmol/L (ref 3.5–5.1)
Sodium: 137 mmol/L (ref 135–145)

## 2015-01-30 LAB — PHOSPHORUS: PHOSPHORUS: 3.3 mg/dL (ref 2.5–4.6)

## 2015-01-30 LAB — GLUCOSE, CAPILLARY
GLUCOSE-CAPILLARY: 130 mg/dL — AB (ref 70–99)
GLUCOSE-CAPILLARY: 114 mg/dL — AB (ref 70–99)
GLUCOSE-CAPILLARY: 119 mg/dL — AB (ref 70–99)
Glucose-Capillary: 116 mg/dL — ABNORMAL HIGH (ref 70–99)

## 2015-01-30 MED ORDER — SULFAMETHOXAZOLE-TRIMETHOPRIM 800-160 MG PO TABS
2.0000 | ORAL_TABLET | ORAL | Status: DC
  Administered 2015-01-30 – 2015-02-03 (×5): 2 via ORAL
  Filled 2015-01-30 (×5): qty 2

## 2015-01-30 MED ORDER — FAT EMULSION 20 % IV EMUL
500.0000 mL | INTRAVENOUS | Status: AC
  Administered 2015-01-31: 500 mL via INTRAVENOUS
  Filled 2015-01-30 (×3): qty 500

## 2015-01-30 MED ORDER — TRACE MINERALS CR-CU-MN-SE-ZN 10-1000-500-60 MCG/ML IV SOLN
INTRAVENOUS | Status: AC
  Administered 2015-01-30: 19:00:00 via INTRAVENOUS
  Filled 2015-01-30: qty 1440

## 2015-01-30 MED ORDER — MAGNESIUM SULFATE IN D5W 10-5 MG/ML-% IV SOLN
1.0000 g | Freq: Once | INTRAVENOUS | Status: AC
  Administered 2015-01-30: 1 g via INTRAVENOUS
  Filled 2015-01-30: qty 100

## 2015-01-30 MED ORDER — FAT EMULSION 20 % IV EMUL
500.0000 mL | INTRAVENOUS | Status: DC
  Filled 2015-01-30 (×3): qty 500

## 2015-01-30 MED ORDER — TRACE MINERALS CR-CU-MN-SE-ZN 10-1000-500-60 MCG/ML IV SOLN
INTRAVENOUS | Status: DC
  Filled 2015-01-30: qty 1440

## 2015-01-30 NOTE — Progress Notes (Signed)
CC: ileus Subjective: Patient with multiple medical pot problems including end-stage renal disease being followed for ileus. He continues to have a distended abdomen. He does not experience any nausea or vomiting with the NG tube in place. He has not passed gas but did have a bowel movement today.  Objective: Vital signs in last 24 hours: Temp:  [98 F (36.7 C)-98.1 F (36.7 C)] 98 F (36.7 C) (05/09 0446) Pulse Rate:  [84-86] 86 (05/09 0446) Resp:  [18-20] 20 (05/09 0446) BP: (98-107)/(57-63) 107/63 mmHg (05/09 0446) SpO2:  [98 %-100 %] 98 % (05/09 0446) Weight:  [95.618 kg (210 lb 12.8 oz)] 95.618 kg (210 lb 12.8 oz) (05/09 0446) Last BM Date: 01/28/15  Intake/Output from previous day: 05/08 0701 - 05/09 0700 In: 1921.7 [IV Piggyback:855; TPN:1066.7] Out: 1400 [Urine:1250; Emesis/NG output:150] Intake/Output this shift: Total I/O In: 499.3 [TPN:499.3] Out: 500 [Urine:400; Emesis/NG output:100]  Physical exam:  NG tube is in place.  Minimal output is noted. Abdomen is distended and tympanitic but not tight. Nor is the abdomen tender. There are no peritoneal signs.  Lab Results: CBC   Recent Labs  01/29/15 0633 01/30/15 0605  WBC 10.9* 10.6  HGB 7.5* 7.5*  HCT 23.3* 22.3*  PLT 236 208   BMET  Recent Labs  01/29/15 0633 01/30/15 0605  NA 139 137  K 3.6 3.5  CL 101 101  CO2 30 27  GLUCOSE 115* 111*  BUN 20 28*  CREATININE 4.64* 6.29*  CALCIUM 7.5* 7.4*   PT/INR No results for input(s): LABPROT, INR in the last 72 hours. ABG No results for input(s): PHART, HCO3 in the last 72 hours.  Invalid input(s): PCO2, PO2  Studies/Results: Dg Chest 2 View  01/30/2015   CLINICAL DATA:  Shortness of breath  EXAM: CHEST  2 VIEW  COMPARISON:  CT chest dated 01/26/2015  FINDINGS: Patchy bilateral lower lobe opacities, likely atelectasis. Moderate left pleural effusion. No pneumothorax.  Right IJ venous catheter terminates in the mid SVC.  Cardiomegaly with pericardial  effusion on prior CT.  Enteric tube courses below the diaphragm.  IMPRESSION: Moderate left pleural effusion with bilateral lower lobe atelectasis.  Cardiomegaly with pericardial effusion on prior CT.   Electronically Signed   By: Julian Hy M.D.   On: 01/30/2015 08:47   US Renal  01/30/2015   CLINICAL DATA:  Subsequent encounter for hydronephrosis  EXAM: RENAL / URINARY TRACT ULTRASOUND COMPLETE  COMPARISON:  CT scan from 01/18/2015.  FINDINGS: Right Kidney:  Length: 11.5 cm. Collecting system fullness noted without overt hydronephrosis.  Left Kidney:  Length: 11.2 cm. Mild fullness of the intrarenal collecting system noted. Echogenic structure identified in the interpolar region presumably related to internal ureteral stent.  Bladder:  Decompressed by Foley catheter.  Note:  Nodule seen adjacent to the upper pole the right kidney compatible with adrenal nodularity seen on previous CT scan. 15 mm gallstone noted.  IMPRESSION: Mild fullness of both intrarenal collecting systems. Urinary bladder is decompressed by Foley catheter.  Cholelithiasis.  Right adrenal nodularity.   Electronically Signed   By: Misty Stanley M.D.   On: 01/30/2015 10:33   Dg Abd 2 Views  01/30/2015   CLINICAL DATA:  Adynamic ileus.  EXAM: ABDOMEN - 2 VIEW  COMPARISON:  Jan 27, 2015.  FINDINGS: Small bowel dilatation seen on prior exam is significantly improved, although residual dilatation remains in left lower quadrant of the abdomen. Distal tip of nasogastric tube is seen in proximal stomach. Bilateral ureteral  stents are again noted and unchanged. Status post stent graft repair of abdominal aortic aneurysm. Coil embolization of both internal iliac arteries is noted. Degenerative change of both hip joints is noted.  IMPRESSION: Mild small bowel dilatation is noted which is significantly improved compared to prior exam, most consistent with resolving ileus.   Electronically Signed   By: Marijo Conception, M.D.   On: 01/30/2015 08:42     Anti-infectives: Anti-infectives    Start     Dose/Rate Route Frequency Ordered Stop   01/30/15 2200  sulfamethoxazole-trimethoprim (BACTRIM DS,SEPTRA DS) 800-160 MG per tablet 2 tablet     2 tablet Oral Every 24 hours 01/30/15 0818     01/27/15 2200  sulfamethoxazole-trimethoprim (BACTRIM DS,SEPTRA DS) 800-160 MG per tablet 1 tablet  Status:  Discontinued     1 tablet Oral Every 12 hours 01/27/15 1610 01/30/15 0818   01/27/15 1800  ciprofloxacin (CIPRO) tablet 500 mg  Status:  Discontinued    Comments:  Patient started Ciprofloxacin on 5/3.   500 mg Oral Daily 01/27/15 0812 01/27/15 1446   01/27/15 1800  ciprofloxacin (CIPRO) IVPB 400 mg    Comments:  Patient started Ciprofloxacin on 5/3.   400 mg 200 mL/hr over 60 Minutes Intravenous Every 24 hours 01/27/15 1446     01/24/15 1800  ciprofloxacin (CIPRO) IVPB 400 mg  Status:  Discontinued     400 mg 200 mL/hr over 60 Minutes Intravenous Every 24 hours 01/24/15 1521 01/27/15 0812   01/24/15 1200  vancomycin (VANCOCIN) IVPB 1000 mg/200 mL premix  Status:  Discontinued     1,000 mg 200 mL/hr over 60 Minutes Intravenous Every T-Th-Sa (Hemodialysis) 01/21/15 1551 01/24/15 1520   01/22/15 2100  meropenem (MERREM) 1 g in sodium chloride 0.9 % 100 mL IVPB  Status:  Discontinued     1 g 200 mL/hr over 30 Minutes Intravenous Every 24 hours 01/21/15 1219 01/22/15 1336   01/22/15 2100  meropenem (MERREM) 500 mg in sodium chloride 0.9 % 50 mL IVPB  Status:  Discontinued     500 mg 100 mL/hr over 30 Minutes Intravenous Every 24 hours 01/22/15 1336 01/24/15 1520   01/22/15 0000  vancomycin (VANCOCIN) IVPB 1000 mg/200 mL premix  Status:  Discontinued     1,000 mg 200 mL/hr over 60 Minutes Intravenous Every Dialysis 01/21/15 1437 01/21/15 1551      Assessment/Plan:  This is a patient with an ileus and multiple medical problems. He has not vomited and has had a bowel movement his abdominal exam is consistent with ileus not a bowel obstruction.  The plan would be to clamp his nasogastric tube this evening and possibly remove it tomorrow morning. This was discussed with his admitting physician earlier today.  Florene Glen, MD, FACS  01/30/2015

## 2015-01-30 NOTE — Progress Notes (Signed)
Nutrition Follow-up  DOCUMENTATION CODES:     INTERVENTION:  PN: Spoke with Dr. Candiss Norse (neprohology) and agreeable to increasing TPN of 5%AA/15% dextrose without electrolytes to 72ml/hr today and adding lipids tomorrow for additional kcals.  TPN currently providing 1477ml + 567ml from lipids tomrrow of fluid (1922ml in next 24 hr), 72 gm of protein (90% protein needs), 877 kcals with lipids 1 time (53% kcals needs).    NUTRITION DIAGNOSIS:  Inadequate oral intake related to altered GI function as evidenced by NPO status, percent weight loss, energy intake < or equal to 50% for > or equal to 5 days, being addressed with TPN.    GOAL:  Provide needs based on ASPEN/SCCM guidelines    MONITOR:  Diet advancement, Labs, Weight trends, I & O's  REASON FOR ASSESSMENT:   (follow-up) New TPN/TNA  ASSESSMENT:  Pt with afib.  Per MD note pt with improvement in abdominal xray, BM on Saturday. GI consulted  Electrolyte and Renal Profile:    Recent Labs Lab 01/28/15 0454 01/29/15 0633 01/30/15 0605  BUN 34* 20 28*  CREATININE 6.72* 4.64* 6.29*  NA 139 139 137  K 2.8* 3.6 3.5  MG 1.8 1.7 1.5*  PHOS 4.2 2.1* 3.3   Medications: reviewed  Height:  Ht Readings from Last 1 Encounters:  01/20/15 5' 6.97" (1.701 m)    Weight:  Wt Readings from Last 1 Encounters:  01/30/15 210 lb 12.8 oz (95.618 kg)       Wt Readings from Last 10 Encounters:  01/30/15 210 lb 12.8 oz (95.618 kg)    BMI:  Body mass index is 33.05 kg/(m^2).  Estimated Nutritional Needs:  Kcal:  1610-9604 kcal/ day (BEE: 1373 x 1.2 AF x 1.0-1.2 IF)- using IBW of 67 kg  Protein:  80-101 g Pro/day (1.2-1.5 g Pro/ kg/ day)- using IBW of 67 kg  Fluid:  1025ml + UOP  Skin:  Wound (see comment) (Stage II Sacrum)  Diet Order:  TPN (CLINIMIX) Adult without lytes TPN (CLINIMIX) Adult without lytes  EDUCATION NEEDS:  No education needs identified at this time   Intake/Output Summary (Last 24 hours)  at 01/30/15 1430 Last data filed at 01/30/15 1134  Gross per 24 hour  Intake 2420.99 ml  Output   1500 ml  Net 920.99 ml    Last BM:  5/7  High level of care  Ziyon Soltau B. Zenia Resides, [place], [place] (pager)

## 2015-01-30 NOTE — Progress Notes (Signed)
PARENTERAL NUTRITION CONSULT NOTE - INITIAL  Pharmacy Consult for Electrolyte Supplementation/Glucose Management Indication: TPN   Allergies  Allergen Reactions  . Penicillins Hives    Patient Measurements: Height: 5' 6.97" (170.1 cm) Weight: 210 lb 12.8 oz (95.618 kg) IBW/kg (Calculated) : 66.03   Vital Signs: Temp: 98 F (36.7 C) (05/09 0446) Temp Source: Oral (05/09 0446) BP: 107/63 mmHg (05/09 0446) Pulse Rate: 86 (05/09 0446) Intake/Output from previous day: 05/08 0701 - 05/09 0700 In: 1921.7 [IV Piggyback:855; TPN:1066.7] Out: 1400 [Urine:1250; Emesis/NG output:150] Intake/Output from this shift: Total I/O In: 499.3 [TPN:499.3] Out: 100 [Emesis/NG output:100]  Labs:  Recent Labs  01/28/15 0454 01/29/15 0633 01/30/15 0605  WBC 12.6* 10.9* 10.6  HGB 7.7* 7.5* 7.5*  HCT 23.1* 23.3* 22.3*  PLT 258 236 208     Recent Labs  01/28/15 0454 01/28/15 0456 01/29/15 0633 01/30/15 0605  NA 139  --  139 137  K 2.8*  --  3.6 3.5  CL 98*  --  101 101  CO2 31  --  30 27  GLUCOSE 118*  --  115* 111*  BUN 34*  --  20 28*  CREATININE 6.72*  --  4.64* 6.29*  CALCIUM 7.5*  --  7.5* 7.4*  MG 1.8  --  1.7 1.5*  PHOS 4.2  --  2.1* 3.3  PREALBUMIN  --  7.1*  --   --   TRIG 72  --   --   --    Estimated Creatinine Clearance: 11.5 mL/min (by C-G formula based on Cr of 6.29).    Recent Labs  01/29/15 1632 01/29/15 2034 01/30/15 0242  GLUCAP 125* 110* 116*    Medical History: Past Medical History  Diagnosis Date  . PAF (paroxysmal atrial fibrillation)     a. new onset 12/2014 in the setting of UTI, sepsis, hypotension, and anemia; b. not on long term anticoagulation given anemia; c. family aware of stroke risk, they are ok with this; d. on amiodarone   . Chronic combined systolic and diastolic CHF (congestive heart failure)     a. echo 12/2014: EF 25-30%, anterior wall wall motion abnormalities; b. planned ischemic evaluation once patient is stable medically  .  ESRD on hemodialysis     a. Tuesday, Thursday, and Saturdays  . Anemia     a. baseline hgb ~ 8  . History of small bowel obstruction     a. 01/2015    Medications:  Scheduled:  . atorvastatin  40 mg Oral Daily  . ciprofloxacin  400 mg Intravenous Q24H  . insulin aspart  0-9 Units Subcutaneous Q6H  . magnesium sulfate 1 - 4 g bolus IVPB  1 g Intravenous Once  . pantoprazole (PROTONIX) IV  40 mg Intravenous Q24H  . sodium chloride  10-40 mL Intracatheter Q12H  . sulfamethoxazole-trimethoprim  2 tablet Oral Q24H   Infusions:  . amiodarone 30 mg/hr (01/30/15 0030)  . TPN (CLINIMIX) Adult without lytes 40 mL/hr at 01/30/15 0729    Insulin Requirements in the past 24 hours:  0 units SSI given.    Assessment: Patient is a 74 yo male with ESRD requiring dialysis.  Dialysis schedule is TTHS.  Patient with ileus requiring TPN.  Currently ordered Clinimix 5/15 no E at 40 mL/hr.  Goal rate of  80 mL/hr.   Scr:6.28, Na: 137,  K: 3.5, Mag: 1.5, Phos: 3.3, Ca: 7.4, albumin: 1.8 (5/6), CCa: 9.2  Magnesium slightly low at 1.5.  No SSI used in  24 hours.  Plan:  Continue current sliding scale orders q6h.  Will supplement with Magnesium Sulfate 1 gm IV once (lower dose as patient is ESRD receiving HD).   Will recheck labs in AM.  Pharmacy will continue to follow.  Loleta Dicker, RPh Clinical Pharmacist 01/30/2015

## 2015-01-30 NOTE — Progress Notes (Signed)
MEDICATION RELATED CONSULT NOTE - FOLLOW UP   Pharmacy Consult for Renal dosing in HD Patient Indication: Medication adjustments in HD patient  Allergies  Allergen Reactions  . Penicillins Hives    Patient Measurements: Height: 5' 6.97" (170.1 cm) Weight: 210 lb 12.8 oz (95.618 kg) IBW/kg (Calculated) : 66.03   Labs:  Recent Labs  01/28/15 0454 01/29/15 0633 01/30/15 0605  WBC 12.6* 10.9* 10.6  HGB 7.7* 7.5* 7.5*  HCT 23.1* 23.3* 22.3*  PLT 258 236 208  CREATININE 6.72* 4.64* 6.29*  MG 1.8 1.7 1.5*  PHOS 4.2 2.1* 3.3   Estimated Creatinine Clearance: 11.5 mL/min (by C-G formula based on Cr of 6.29).     Medications:  Prescriptions prior to admission  Medication Sig Dispense Refill Last Dose  . amLODipine (NORVASC) 5 MG tablet Take 5 mg by mouth at bedtime.     . budesonide-formoterol (SYMBICORT) 160-4.5 MCG/ACT inhaler Inhale 2 puffs into the lungs 2 (two) times daily.     . chlorpheniramine (CHLOR-TRIMETON) 4 MG tablet Take 4 mg by mouth every 4 (four) hours as needed for allergies.     Marland Kitchen loperamide (IMODIUM) 2 MG capsule Take 2 mg by mouth every 4 (four) hours as needed for diarrhea or loose stools.     . magnesium oxide (MAG-OX) 400 MG tablet Take 400 mg by mouth at bedtime.     . NONFORMULARY OR COMPOUNDED ITEM Take 1 tablet by mouth daily. Rena-Vite Vitamin B Complex with C and Folic Acid     . sevelamer carbonate (RENVELA) 800 MG tablet Take 800 mg by mouth 3 (three) times daily with meals.     . Tiotropium Bromide Monohydrate 2.5 MCG/ACT AERS Inhale 1 puff into the lungs daily.      Scheduled:  . atorvastatin  40 mg Oral Daily  . ciprofloxacin  400 mg Intravenous Q24H  . insulin aspart  0-9 Units Subcutaneous Q6H  . pantoprazole (PROTONIX) IV  40 mg Intravenous Q24H  . sodium chloride  10-40 mL Intracatheter Q12H  . sulfamethoxazole-trimethoprim  1 tablet Oral Q12H    Assessment:  Patient with ESRD requiring HD on Tues, Thurs, Sat schedule.  Goal of  Therapy:  Adjustment of medications for renal function.  Plan:  Patient currently ordered Septra 1 DS tablet q12h.  Will order Septra 2 DS tablets q24h to be given after dialysis on dialysis days.  No other adjustments warranted at this time. Pharmacy will continue to follow.  Murrell Converse, PharmD Clinical Pharmacist 01/30/2015

## 2015-01-30 NOTE — Progress Notes (Signed)
[facility] [place] NAME: [name]    MR#:  [id number]  DATE OF BIRTH:  10-31-1940  SUBJECTIVE: Feels some what better. No fever.  CHIEF COMPLAINT:  No new complains today.  REVIEW OF SYSTEMS:  CONSTITUTIONAL: No fever, fatigue or weakness.  EYES: No blurred or double vision.  EARS, NOSE, AND THROAT: No tinnitus or ear pain.  RESPIRATORY: No cough, shortness of breath, wheezing or hemoptysis.  CARDIOVASCULAR: No chest pain, orthopnea, edema.  GASTROINTESTINAL: No nausea, vomiting, diarrhea or abdominal pain.  GENITOURINARY: No dysuria, hematuria.  ENDOCRINE: No polyuria, nocturia,  HEMATOLOGY: No anemia, easy bruising or bleeding SKIN: No rash or lesion. MUSCULOSKELETAL: No joint pain or arthritis.   NEUROLOGIC: No tingling, numbness, weakness.  PSYCHIATRY: No anxiety or depression.   DRUG ALLERGIES:   Allergies  Allergen Reactions  . Penicillins Hives    VITALS:  Blood pressure 107/63, pulse 86, temperature 98 F (36.7 C), temperature source Oral, resp. rate 20, height 5' 6.97" (1.701 m), weight 95.618 kg (210 lb 12.8 oz), SpO2 98 %.  PHYSICAL EXAMINATION:  GENERAL:  74 y.o.-year-old patient lying in the bed with no acute distress.  EYES: Pupils equal, round, reactive to light and accommodation. No scleral icterus. Extraocular muscles intact.  HEENT: Head atraumatic, normocephalic. Oropharynx and nasopharynx clear.  NECK:  Supple, no jugular venous distention. No thyroid enlargement, no tenderness.  LUNGS: Normal breath sounds bilaterally, no wheezing, rales,rhonchi or crepitation. No use of accessory muscles of respiration.  CARDIOVASCULAR: S1, S2 normal. No murmurs, rubs, or gallops.  ABDOMEN: Soft, nontender, nondistended. Sluggish bowel sounds present. No organomegaly or mass.  EXTREMITIES: No pedal edema, cyanosis, or clubbing.  NEUROLOGIC: Cranial nerves II through XII are intact. Muscle strength 5/5 in all  extremities. Sensation intact. Gait not checked.  PSYCHIATRIC: The patient is alert and oriented x 3.  SKIN: No obvious rash, lesion, or ulcer.    LABORATORY PANEL:   CBC  Recent Labs Lab 01/30/15 0605  WBC 10.6  HGB 7.5*  HCT 22.3*  PLT 208   ------------------------------------------------------------------------------------------------------------------  Chemistries   Recent Labs Lab 01/30/15 0605  NA 137  K 3.5  CL 101  CO2 27  GLUCOSE 111*  BUN 28*  CREATININE 6.29*  CALCIUM 7.4*  MG 1.5*   ------------------------------------------------------------------------------------------------------------------  ---------------------------------------------------------------------------------------------------------------   ASSESSMENT AND PLAN:  64 m with Bladder/prostate cancer, ESRD on HD, hypertension here with confusion, SOB  * New onset Afib with RVR: due to sepsis, now back in NSR on amio drip. Still NPO and has NGT. - Holding anticoagulation for now given anemia - No diltiazem given low EF per cardio  * ileus: NPO for now, NGT suction since 01/22/15. Still no BM orpassing gas. Appreciate surgical consult. conservative mgmt for now. As no oral intake for 6 days- Dietary involved to start TPN.  Xray shows significant improvement, and had a BM Saturday night- will wait for Sx inputs.  * Septic shock due to UTI  Had ureteral stents- seen by ID, will c/s urology to see if stents will need to be replaced per ID.  BP stable.  on bactrim - can stop on 5/16 per ID.  repeated cx just 2000CFU gr neg rods. Has ureteral stents and Hx of prostate cancer- may need stent removal and to continue foley - per urology, advised to follow in office.  * GI bleed  Have Occult blood positive in Gastric secretions, and required 2 Units of blood transfusion  on last 3 days.  Appreciate GI c/s  * ESRD on HD  manage per nephrology  * Anemia of Chronic Disease with  worsening due to GI loss Transfused 1 unit PRBC 01/26/15 and 01/26/14.  Target Hb should be around 8 per cardiology.   * Pericardial effusion  Cardio on case- moderate Effusion per echo, but no temponade. EF is 45%  * Acute resp failure on admission Aspiration? Monitor.  Now on room air.  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 EF 45-50% on echo this admission.  -He will need cardiac cath once stable, including anemia work up which is ongoing.  -Lipitor 40 mg daily - can add low dose beta blocker (Coreg) if BP remains stable, maybe tomorrow. - Volume controlled by HD.    Overall prognosis is very poor due to multiple medical issues Spoke to Pt and his POA- [name] ( cousin)- they agreed to meet palliative care team to help decide.   All the records are reviewed and case discussed with nurse. Management plans discussed with the patient, family and they are in agreement.  CODE STATUS: Full Code  TOTAL TIME TAKING CARE OF THIS PATIENT: 35 minutes.   POSSIBLE D/C IN 3-4 DAYS, DEPENDING ON CLINICAL CONDITION.    Vaughan Basta M.D on 01/30/2015 at 12:07 PM  Between 7am to 6pm - Pager - [phone]  After 6pm go to [email or web address] - password [email or web address] [facility]  Office  [phone]  CC: Primary care physician; Dion Body, MD

## 2015-01-30 NOTE — Progress Notes (Signed)
Notified Vachhani that pt had a 17 beat run VTach.  No interventions ordered

## 2015-01-30 NOTE — Progress Notes (Signed)
Chart reviewed. Pt is NPO with NGT for low suctioning. Hopefully to be capped soon per GI notes. Pt currently has TPN for nutrition as he is NPO. Will f/u with toleration of diet once advanced.

## 2015-01-30 NOTE — Progress Notes (Signed)
Occupational Therapy Treatment Patient Details Name: [name] MRN: [id number] DOB: 12-12-1940 Today's Date: 01/30/2015    History of present illness 74 yo male with onset of septic shock and respriatory distress, hypotension and atelectasis, recent bladder history, PMHx:  a-fib with RVR, prostate CA, ESRD T TH S, neurogenic bladder, AAA, anemia,    OT comments  Pt attempted to stand twice with OT and PT using walker but unable to achieve upright standing position to use walker.  Increased WOB after each attempt.  Improved initiation of sit to stand compared to last week but requires max assist of 2 people to achieve partial stance for 3-5 second.  He continues to be motivated to do as much as he can tolerate during therapy. Plan to give pt theraputty and a ball to squeeze to further increase hand strength.  Functional writing skills with right hand today.  Follow Up Recommendations       Equipment Recommendations       Recommendations for Other Services      Precautions / Restrictions Precautions Precautions: Fall Precaution Comments: pt already assisted to chair by nsg and worked with PT on increasing standing for increased strengthening and for ADLs for LB dressing Restrictions Weight Bearing Restrictions: No       Mobility Bed Mobility                  Transfers                      Balance           Standing balance support: Bilateral upper extremity supported;During functional activity                       ADL Overall ADL's : Needs assistance/impaired   Eating/Feeding Details (indicate cue type and reason): pt currently has NG tube and NPO except for medications with applesauce Grooming: Wash/dry face;Min guard;Cueing for sequencing;Sitting       Lower Body Bathing: Maximal assistance Lower Body Bathing Details (indicate cue type and reason): worked on reaching feet while seated after working on standing and pt too fatigued to  work on this for this session                        General ADL Comments: Pt is motivated to get up to chair 1-2 times a day and able to reach face for grooming skills and eager to regain independence and overall strength and stamina for ADLs.  Pt states he had a friend help him out of bed and into wc prior to admission and cannot remember the last time he stood up.  He completed most taksks from wc.      Vision                     Perception     Praxis      Cognition   Behavior During Therapy: [facility] for tasks assessed/performed Overall Cognitive Status: Within Functional Limits for tasks assessed (pt oriented to person, place (5/8 vs 5/9) and situation)                       Extremity/Trunk Assessment               Exercises General Exercises - Upper Extremity Elbow Flexion: AROM;Both;5 reps Elbow Extension: AROM;Both;5 reps Digit Composite Flexion: AROM;Both;Seated;10 reps Hand Exercises Forearm Supination: AROM;Seated;Both;10  reps Forearm Pronation: AAROM;Both;10 reps;Seated Opposition: AROM;Both;Seated;10 reps Hand Activities Writing Skills: Right;5 reps;Seated   Shoulder Instructions       General Comments      Pertinent Vitals/ Pain       Pain Assessment: No/denies pain  Home Living Family/patient expects to be discharged to:: Skilled nursing facility                                        Prior Functioning/Environment              Frequency Min 1X/week     Progress Toward Goals  OT Goals(current goals can now be found in the care plan section)  Progress towards OT goals: Progressing toward goals  Acute Rehab OT Goals Patient Stated Goal: I want to try to stand  Plan Discharge plan remains appropriate    Co-evaluation    PT/OT/SLP Co-Evaluation/Treatment: Yes Reason for Co-Treatment: Complexity of the patient's impairments (multi-system involvement);For patient/therapist safety   OT goals  addressed during session: ADL's and self-care;Strengthening/ROM      End of Session Equipment Utilized During Treatment: Rolling walker;Oxygen   Activity Tolerance Patient limited by fatigue   Patient Left in chair;with chair alarm set   Nurse Communication Other (comment) (tests that were completed today but no results yet)        Time: 1030-1055 OT Time Calculation (min): 25 min  Charges: OT General Charges $OT Visit: 1 Procedure OT Treatments $Self Care/Home Management : 8-22 mins $Therapeutic Activity: 8-22 mins  Janeese Mcgloin 01/30/2015, 11:05 AM    Chrys Racer, OTR/L

## 2015-01-30 NOTE — Care Management Note (Signed)
Case Management Note  Patient Details  Name: OCTAVIOUS ZIDEK MRN: [id number] Date of Birth: 07/29/1941  Subjective/Objective:                    Action/Plan:   Expected Discharge Date:                  Expected Discharge Plan:  [place]  In-House Referral:     Discharge planning Services  CM Consult  Post Acute Care Choice:    Choice offered to:     DME Arranged:    DME Agency:     HH Arranged:    [place] Agency:     Status of Service:  In process, will continue to follow  Medicare Important Message Given:   YES Date Medicare IM Given:   01/30/15 Medicare IM give by:   Elwood Bazinet, CM Date Additional Medicare IM Given:    Additional Medicare Important Message give by:     If discussed at [place] of Stay Meetings, dates discussed:    Additional Comments:  Stefana Lodico A, RN 01/30/2015, 12:47 PM

## 2015-01-30 NOTE — Progress Notes (Signed)
[facility]  ROUNDING NOTE   Subjective:   No acute c/o . Reports dry mouth. Getting TPN. Wants NGT out No SOB Objective:  Vital signs in last 24 hours:  Temp:  [98 F (36.7 C)-98.1 F (36.7 C)] 98 F (36.7 C) (05/09 0446) Pulse Rate:  [84-86] 86 (05/09 0446) Resp:  [18-20] 20 (05/09 0446) BP: (98-107)/(57-63) 107/63 mmHg (05/09 0446) SpO2:  [98 %-100 %] 98 % (05/09 0446) Weight:  [95.618 kg (210 lb 12.8 oz)] 95.618 kg (210 lb 12.8 oz) (05/09 0446)  Weight change: -4.382 kg (-9 lb 10.6 oz) Filed Weights   01/28/15 1400 01/29/15 0328 01/30/15 0446  Weight: 100 kg (220 lb 7.4 oz) 95.9 kg (211 lb 6.7 oz) 95.618 kg (210 lb 12.8 oz)    Intake/Output: I/O last 3 completed shifts: In: 1921.7 [IV WCBJSEGBT:517] Out: 1900 [OHYWV:3710; Emesis/NG output:250]   Intake/Output this shift:  Total I/O In: 499.3 [TPN:499.3] Out: 500 [Urine:400; Emesis/NG output:100]  General: not in acute distress Head: +NGT to suction Neck: supple, RIJ central line CVS: regular rate and rhythm Resp: clear  ABD: +soft, +bowel sounds EXT: no edema Neuro: alert and oriented Access: Left arm AVF   Basic Metabolic Panel:  Recent Labs Lab 01/24/15 0940  01/25/15 1246 01/26/15 0510 01/27/15 0500 01/28/15 0454 01/29/15 0633 01/30/15 0605  NA  --   < > 142 141 140 139 139 137  K  --   < > 3.7 3.3* 3.1* 2.8* 3.6 3.5  CL  --   < > 100* 100* 97* 98* 101 101  CO2  --   < > 27 28 30 31 30 27   GLUCOSE  --   < > 86 87 97 118* 115* 111*  BUN  --   < > 34* 41* 26* 34* 20 28*  CREATININE  --   < > 7.06* 8.00* 5.61* 6.72* 4.64* 6.29*  CALCIUM  --   < > 7.7* 7.4* 7.5* 7.5* 7.5* 7.4*  MG 2.2  --   --   --  1.8 1.8 1.7 1.5*  PHOS  --   --  5.4* 6.2*  --  4.2 2.1* 3.3  < > = values in this interval not displayed.  Liver Function Tests:  Recent Labs Lab 01/25/15 1246 01/26/15 0510  ALBUMIN 2.3* 2.1*   No results for input(s): LIPASE, AMYLASE in the last 168 hours. No results for  input(s): AMMONIA in the last 168 hours.  CBC:  Recent Labs Lab 01/26/15 0510 01/26/15 1745 01/27/15 0500 01/28/15 0454 01/29/15 0633 01/30/15 0605  WBC 8.9  --  11.3* 12.6* 10.9* 10.6  HGB 6.3* 7.5* 6.9* 7.7* 7.5* 7.5*  HCT 19.4* 23.3* 21.6* 23.1* 23.3* 22.3*  MCV 89.6  --  88.8 87.8 88.6 87.9  PLT 258  --  259 258 236 208    Cardiac Enzymes: No results for input(s): CKTOTAL, CKMB, CKMBINDEX, TROPONINI in the last 168 hours.  BNP: Invalid input(s): POCBNP  CBG:  Recent Labs Lab 01/29/15 1632 01/29/15 2034 01/30/15 0242 01/30/15 1013 01/30/15 1413  GLUCAP 125* 110* 116* 130* 114*    Microbiology: Results for orders placed or performed during the hospital encounter of 01/18/15  Culture, blood (single)     Status: None (Preliminary result)   Collection Time: 01/18/15  7:11 PM  Result Value Ref Range Status   Micro Text Report   Preliminary       COMMENT  NO GROWTH IN 48 HOURS   ANTIBIOTIC                                                      Culture, blood (single)     Status: None (Preliminary result)   Collection Time: 01/18/15  7:11 PM  Result Value Ref Range Status   Micro Text Report   Preliminary       COMMENT                   NO GROWTH IN 48 HOURS   ANTIBIOTIC                                                      Urine culture     Status: None   Collection Time: 01/18/15  7:36 PM  Result Value Ref Range Status   Micro Text Report   Final       SOURCE: INDWELLING CATHETER    COMMENT                   NO GROWTH IN 36 HOURS   ANTIBIOTIC                                                      Urine culture     Status: None   Collection Time: 01/20/15  4:09 PM  Result Value Ref Range Status   Micro Text Report   Final       SOURCE: INDWELLING CATH    ORGANISM 1                2000 CFU/ML ENTEROBACTER SPECIES   COMMENT                   UNABLE TO FURTHER SPECIATE   COMMENT                   -   ANTIBIOTIC                     ORG#1     CEFAZOLIN                     R         CEFOXITIN                     R         CEFTRIAXONE                   R         CIPROFLOXACIN                 I         ERTAPENEM                     S         GENTAMICIN  S         IMIPENEM                      S         LEVOFLOXACIN                  I         NITROFURANTOIN                I         TRIMETH/SULFA                 S             Coagulation Studies: No results for input(s): LABPROT, INR in the last 72 hours.  Urinalysis: No results for input(s): COLORURINE, LABSPEC, PHURINE, GLUCOSEU, HGBUR, BILIRUBINUR, KETONESUR, PROTEINUR, UROBILINOGEN, NITRITE, LEUKOCYTESUR in the last 72 hours.  Invalid input(s): APPERANCEUR    Imaging: Dg Chest 2 View  01/30/2015   CLINICAL DATA:  Shortness of breath  EXAM: CHEST  2 VIEW  COMPARISON:  CT chest dated 01/26/2015  FINDINGS: Patchy bilateral lower lobe opacities, likely atelectasis. Moderate left pleural effusion. No pneumothorax.  Right IJ venous catheter terminates in the mid SVC.  Cardiomegaly with pericardial effusion on prior CT.  Enteric tube courses below the diaphragm.  IMPRESSION: Moderate left pleural effusion with bilateral lower lobe atelectasis.  Cardiomegaly with pericardial effusion on prior CT.   Electronically Signed   By: Julian Hy M.D.   On: 01/30/2015 08:47   US Renal  01/30/2015   CLINICAL DATA:  Subsequent encounter for hydronephrosis  EXAM: RENAL / URINARY TRACT ULTRASOUND COMPLETE  COMPARISON:  CT scan from 01/18/2015.  FINDINGS: Right Kidney:  Length: 11.5 cm. Collecting system fullness noted without overt hydronephrosis.  Left Kidney:  Length: 11.2 cm. Mild fullness of the intrarenal collecting system noted. Echogenic structure identified in the interpolar region presumably related to internal ureteral stent.  Bladder:  Decompressed by Foley catheter.  Note:  Nodule seen adjacent to the upper pole the right kidney compatible with adrenal  nodularity seen on previous CT scan. 15 mm gallstone noted.  IMPRESSION: Mild fullness of both intrarenal collecting systems. Urinary bladder is decompressed by Foley catheter.  Cholelithiasis.  Right adrenal nodularity.   Electronically Signed   By: Misty Stanley M.D.   On: 01/30/2015 10:33   Dg Abd 2 Views  01/30/2015   CLINICAL DATA:  Adynamic ileus.  EXAM: ABDOMEN - 2 VIEW  COMPARISON:  Jan 27, 2015.  FINDINGS: Small bowel dilatation seen on prior exam is significantly improved, although residual dilatation remains in left lower quadrant of the abdomen. Distal tip of nasogastric tube is seen in proximal stomach. Bilateral ureteral stents are again noted and unchanged. Status post stent graft repair of abdominal aortic aneurysm. Coil embolization of both internal iliac arteries is noted. Degenerative change of both hip joints is noted.  IMPRESSION: Mild small bowel dilatation is noted which is significantly improved compared to prior exam, most consistent with resolving ileus.   Electronically Signed   By: Marijo Conception, M.D.   On: 01/30/2015 08:42     Medications:   . amiodarone 30 mg/hr (01/30/15 1440)  . TPN (CLINIMIX) Adult without lytes     And  . [START ON 01/31/2015] fat emulsion     . atorvastatin  40 mg Oral Daily  . ciprofloxacin  400 mg Intravenous Q24H  . insulin aspart  0-9 Units Subcutaneous Q6H  . pantoprazole (PROTONIX) IV  40 mg Intravenous Q24H  . sodium chloride  10-40 mL Intracatheter Q12H  . sulfamethoxazole-trimethoprim  2 tablet Oral Q24H   acetaminophen, diphenhydrAMINE, ondansetron (ZOFRAN) IV, phenol, sodium chloride, sodium chloride  Assessment/ Plan:  74 y.o. 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 Hemodialysis  - Continue TTS schedule.    2. Anemia of chronic kidney disease: D63.1  - holding epo due to prostate cancer.  - transfusion on 5/5 and 5/6   3. Secondary Hyperparathyroidism: N25.81  - Currently on TPN, will monitor phos level.   4. Ileus: K56.7 NGT to suction. Continue supportive care. Appreciate surgery input. - placed on TPN, add lipids tomorrow   5. A. Fib with RVR: I48.91  - followed by cardiology    LOS: [place] 5/9/20166:07 PM

## 2015-01-30 NOTE — Progress Notes (Signed)
Physical Therapy Treatment Patient Details Name: [name] MRN: [id number] DOB: Feb 06, 1941 Today's Date: 01/30/2015    History of Present Illness 74 yo male with onset of septic shock and respriatory distress, hypotension and atelectasis, recent bladder history, PMHx:  a-fib with RVR, prostate CA, ESRD T TH S, neurogenic bladder, AAA, anemia,     PT Comments    Pt was seen for evaluation of his ability to stand with walker with OT to augment use of UE's and to see where most difficulty lies.  His control of knees is an issue, with limited effort possible to fully extend knees.  Plan is to take his next visit to bedside to use higher surface to control better.   Will try to do this with OT again.  Follow Up Recommendations  CIR     Equipment Recommendations  None recommended by PT    Recommendations for Other Services Rehab consult     Precautions / Restrictions Precautions Precautions: Fall Precaution Comments: pt already assisted to chair by nsg and worked with PT on increasing standing for increased strengthening and for ADLs for LB dressing Restrictions Weight Bearing Restrictions: No    Mobility  Bed Mobility               General bed mobility comments: up when PT arrived  Transfers Overall transfer level: Needs assistance Equipment used: 2 person hand held assist;Rolling walker (2 wheeled) Transfers: Sit to/from Stand Sit to Stand: +2 physical assistance;+2 safety/equipment         General transfer comment: Pt is having some fatigue related to imaging appt earlier and nursing had used standing lift to get up.  PT and OT collaboratively did a stand with observation of medical concerns with lines and monitoring O2 sat and pulses.  100% sat and pulse88   Ambulation/Gait             General Gait Details: unable   Stairs            Wheelchair Mobility    Modified Rankin (Stroke Patients Only)       Balance Overall balance assessment:  Needs assistance Sitting-balance support: Feet supported;Bilateral upper extremity supported Sitting balance-Leahy Scale: Good Sitting balance - Comments: Tired and can make the effort but declines to sitting back quickly Postural control: Posterior lean Standing balance support: Bilateral upper extremity supported;During functional activity Standing balance-Leahy Scale: Poor Standing balance comment: used bed pad and hands on chair to promote more coordinated UE and LE movement, with pt using legs in flexed posture at top of standing from chair.  He looks to be better served to sit bedside and use elevation of bed to initiate complete upright standing.                    Cognition Arousal/Alertness: Awake/alert Behavior During Therapy: WFL for tasks assessed/performed Overall Cognitive Status: Within Functional Limits for tasks assessed (pt oriented to person, place (5/8 vs 5/9) and situation)                      Exercises General Exercises - Upper Extremity Elbow Flexion: AROM;Both;5 reps Elbow Extension: AROM;Both;5 reps Digit Composite Flexion: AROM;Both;Seated;10 reps Hand Exercises Forearm Supination: AROM;Seated;Both;10 reps Forearm Pronation: AAROM;Both;10 reps;Seated Opposition: AROM;Both;Seated;10 reps Hand Activities Writing Skills: Right;5 reps;Seated    General Comments General comments (skin integrity, edema, etc.): Pt attempted to stand twice with OT and PT using walker but unable to achieve upright standing position to  use walker.  Increased WOB after each attempt.  Improved initiation of sit to stand compared to last week but requires max assist of 2 people to achieve partial stance for 3-5 seconds.       Pertinent Vitals/Pain Pain Assessment: No/denies pain    Home Living Family/patient expects to be discharged to:: Skilled nursing facility                    Prior Function            PT Goals (current goals can now be found in the  care plan section) Acute Rehab PT Goals Patient Stated Goal: I want to try to stand Progress towards PT goals: Progressing toward goals    Frequency  Min 2X/week    PT Plan Current plan remains appropriate    Co-evaluation   Reason for Co-Treatment: Complexity of the patient's impairments (multi-system involvement);For patient/therapist safety   OT goals addressed during session: ADL's and self-care     End of Session Equipment Utilized During Treatment: Oxygen Activity Tolerance: Patient limited by fatigue;Patient limited by lethargy Patient left: in chair;with call bell/phone within reach;with chair alarm set     Time: 1884-1660 PT Time Calculation (min) (ACUTE ONLY): 26 min  Charges:  $Therapeutic Activity: 23-37 mins                    G Codes:      Ramond Dial 23-Feb-2015, 11:07 AM   Mee Hives, PT MS Acute Rehab Dept. Number: [phone]

## 2015-01-30 NOTE — Consult Note (Signed)
Subjective: Patient seen for blood-tinged gastric aspirate. Patient also has a ileus. She denies nausea or vomiting. The NG tube is still in place. He is being cycled with intermittent low wall suction. He states he has not had a bowel movement yet and is uncertain as to whether he is passing gas.  Objective: Vital signs in last 24 hours: Temp:  [98 F (36.7 C)-98.1 F (36.7 C)] 98 F (36.7 C) (05/09 0446) Pulse Rate:  [84-86] 86 (05/09 0446) Resp:  [18-20] 20 (05/09 0446) BP: (98-107)/(57-63) 107/63 mmHg (05/09 0446) SpO2:  [98 %-100 %] 98 % (05/09 0446) Weight:  [95.618 kg (210 lb 12.8 oz)] 95.618 kg (210 lb 12.8 oz) (05/09 0446) Blood pressure 107/63, pulse 86, temperature 98 F (36.7 C), temperature source Oral, resp. rate 20, height 5' 6.97" (1.701 m), weight 95.618 kg (210 lb 12.8 oz), SpO2 98 %.   Intake/Output from previous day: 05/08 0701 - 05/09 0700 In: 1921.7 [IV Piggyback:855; TPN:1066.7] Out: 1400 [Urine:1250; Emesis/NG output:150]  Intake/Output this shift: Total I/O In: 499.3 [TPN:499.3] Out: 500 [Urine:400; Emesis/NG output:100]   General appearance:  Elderly-appearing male in no acute distress, appropriate responses to questions. Resp:  Clear to auscultation Cardio:  Regular rate and rhythm GI:  Soft nontender nondistended bowel sounds positive and distant. No high-pitched sounds. Extremities:  No clubbing or cyanosis edema.   Lab Results: Results for orders placed or performed during the hospital encounter of 01/18/15 (from the past 24 hour(s))  Glucose, capillary     Status: Abnormal   Collection Time: 01/29/15  8:34 PM  Result Value Ref Range   Glucose-Capillary 110 (H) 70 - 99 mg/dL  Glucose, capillary     Status: Abnormal   Collection Time: 01/30/15  2:42 AM  Result Value Ref Range   Glucose-Capillary 116 (H) 70 - 99 mg/dL  CBC     Status: Abnormal   Collection Time: 01/30/15  6:05 AM  Result Value Ref Range   WBC 10.6 3.8 - 10.6 K/uL   RBC 2.54  (L) 4.40 - 5.90 MIL/uL   Hemoglobin 7.5 (L) 13.0 - 18.0 g/dL   HCT 22.3 (L) 40.0 - 52.0 %   MCV 87.9 80.0 - 100.0 fL   MCH 29.6 26.0 - 34.0 pg   MCHC 33.6 32.0 - 36.0 g/dL   RDW 17.9 (H) 11.5 - 14.5 %   Platelets 208 150 - 440 K/uL  Basic metabolic panel     Status: Abnormal   Collection Time: 01/30/15  6:05 AM  Result Value Ref Range   Sodium 137 135 - 145 mmol/L   Potassium 3.5 3.5 - 5.1 mmol/L   Chloride 101 101 - 111 mmol/L   CO2 27 22 - 32 mmol/L   Glucose, Bld 111 (H) 65 - 99 mg/dL   BUN 28 (H) 6 - 20 mg/dL   Creatinine, Ser 6.29 (H) 0.61 - 1.24 mg/dL   Calcium 7.4 (L) 8.9 - 10.3 mg/dL   GFR calc non Af Amer 8 (L) >60 mL/min   GFR calc Af Amer 9 (L) >60 mL/min   Anion gap 9 5 - 15  Magnesium     Status: Abnormal   Collection Time: 01/30/15  6:05 AM  Result Value Ref Range   Magnesium 1.5 (L) 1.7 - 2.4 mg/dL  Phosphorus     Status: None   Collection Time: 01/30/15  6:05 AM  Result Value Ref Range   Phosphorus 3.3 2.5 - 4.6 mg/dL  Glucose, capillary  Status: Abnormal   Collection Time: 01/30/15 10:13 AM  Result Value Ref Range   Glucose-Capillary 130 (H) 70 - 99 mg/dL   Comment 1 Notify RN   Glucose, capillary     Status: Abnormal   Collection Time: 01/30/15  2:13 PM  Result Value Ref Range   Glucose-Capillary 114 (H) 70 - 99 mg/dL   Comment 1 Notify RN       Recent Labs  01/28/15 0454 01/29/15 0633 01/30/15 0605  WBC 12.6* 10.9* 10.6  HGB 7.7* 7.5* 7.5*  HCT 23.1* 23.3* 22.3*  PLT 258 236 208   BMET  Recent Labs  01/28/15 0454 01/29/15 0633 01/30/15 0605  NA 139 139 137  K 2.8* 3.6 3.5  CL 98* 101 101  CO2 31 30 27   GLUCOSE 118* 115* 111*  BUN 34* 20 28*  CREATININE 6.72* 4.64* 6.29*  CALCIUM 7.5* 7.5* 7.4*   LFT No results for input(s): PROT, ALBUMIN, AST, ALT, ALKPHOS, BILITOT, BILIDIR, IBILI in the last 72 hours. PT/INR No results for input(s): LABPROT, INR in the last 72 hours. Hepatitis Panel No results for input(s): HEPBSAG,  HCVAB, HEPAIGM, HEPBIGM in the last 72 hours. C-Diff No results for input(s): CDIFFTOX in the last 72 hours. No results for input(s): CDIFFPCR in the last 72 hours.   Studies/Results: Dg Chest 2 View  01/30/2015   CLINICAL DATA:  Shortness of breath  EXAM: CHEST  2 VIEW  COMPARISON:  CT chest dated 01/26/2015  FINDINGS: Patchy bilateral lower lobe opacities, likely atelectasis. Moderate left pleural effusion. No pneumothorax.  Right IJ venous catheter terminates in the mid SVC.  Cardiomegaly with pericardial effusion on prior CT.  Enteric tube courses below the diaphragm.  IMPRESSION: Moderate left pleural effusion with bilateral lower lobe atelectasis.  Cardiomegaly with pericardial effusion on prior CT.   Electronically Signed   By: Julian Hy M.D.   On: 01/30/2015 08:47   US Renal  01/30/2015   CLINICAL DATA:  Subsequent encounter for hydronephrosis  EXAM: RENAL / URINARY TRACT ULTRASOUND COMPLETE  COMPARISON:  CT scan from 01/18/2015.  FINDINGS: Right Kidney:  Length: 11.5 cm. Collecting system fullness noted without overt hydronephrosis.  Left Kidney:  Length: 11.2 cm. Mild fullness of the intrarenal collecting system noted. Echogenic structure identified in the interpolar region presumably related to internal ureteral stent.  Bladder:  Decompressed by Foley catheter.  Note:  Nodule seen adjacent to the upper pole the right kidney compatible with adrenal nodularity seen on previous CT scan. 15 mm gallstone noted.  IMPRESSION: Mild fullness of both intrarenal collecting systems. Urinary bladder is decompressed by Foley catheter.  Cholelithiasis.  Right adrenal nodularity.   Electronically Signed   By: Misty Stanley M.D.   On: 01/30/2015 10:33   Dg Abd 2 Views  01/30/2015   CLINICAL DATA:  Adynamic ileus.  EXAM: ABDOMEN - 2 VIEW  COMPARISON:  Jan 27, 2015.  FINDINGS: Small bowel dilatation seen on prior exam is significantly improved, although residual dilatation remains in left lower quadrant of  the abdomen. Distal tip of nasogastric tube is seen in proximal stomach. Bilateral ureteral stents are again noted and unchanged. Status post stent graft repair of abdominal aortic aneurysm. Coil embolization of both internal iliac arteries is noted. Degenerative change of both hip joints is noted.  IMPRESSION: Mild small bowel dilatation is noted which is significantly improved compared to prior exam, most consistent with resolving ileus.   Electronically Signed   By: Marijo Conception,  M.D.   On: 01/30/2015 08:42    Scheduled Inpatient Medications:   . atorvastatin  40 mg Oral Daily  . ciprofloxacin  400 mg Intravenous Q24H  . insulin aspart  0-9 Units Subcutaneous Q6H  . pantoprazole (PROTONIX) IV  40 mg Intravenous Q24H  . sodium chloride  10-40 mL Intracatheter Q12H  . sulfamethoxazole-trimethoprim  2 tablet Oral Q24H    Continuous Inpatient Infusions:   . amiodarone 30 mg/hr (01/30/15 1440)  . TPN (CLINIMIX) Adult without lytes     And  . [START ON 01/31/2015] fat emulsion      PRN Inpatient Medications:  acetaminophen, diphenhydrAMINE, ondansetron (ZOFRAN) IV, phenol, sodium chloride, sodium chloride  Miscellaneous:   Assessment:  1. No recurrence of that tinged nasogastric aspirate. 2. Ileus resolving.  Plan:  1. Command capping NG tube overnight and repeating abdominal film in the morning. If this shows continued improvement would start a liquid diet.  Lollie Sails MD 01/30/2015, 6:40 PM

## 2015-01-30 NOTE — Progress Notes (Signed)
74yo Mr [name] was admitted 01/18/15 per acute respiratory distress and new A=Fib with RVR. Currently receiving 2L N/C. NPO and has an NG tube per ileus. Receiving TPN.  No plan for this time for surgery. ESRD with HD. Remains very weak, unable to stand independently and requires a two person assist with staying upright when standing. Today is back on Amiodarone per runs of A-Fib with RVR.  Anticipate discharge to SNF.

## 2015-01-30 NOTE — Care Management Note (Signed)
Case Management Note  Patient Details  Name: [name] MRN: [id number] Date of Birth: 1941/04/21  Subjective/Objective:                    Action/Plan:   Expected Discharge Date:                  Expected Discharge Plan:  [place]  In-House Referral:     Discharge planning Services  CM Consult  Post Acute Care Choice:    Choice offered to:     DME Arranged:    DME Agency:     HH Arranged:    [place] Agency:     Status of Service:  In process, will continue to follow  Medicare Important Message Given:   YES Date Medicare IM Given:    01/30/15  Medicare IM give by:   Hester Mates, RN Date Additional Medicare IM Given:    Additional Medicare Important Message give by:     If discussed at [place] of Stay Meetings, dates discussed:    Additional Comments:  Dawna Jakes A, RN 01/30/2015, 1:01 PM

## 2015-01-30 NOTE — Progress Notes (Signed)
Patient: [name] / Admit Date: 01/18/2015 / Date of Encounter: 01/30/2015, 12:25 PM   Subjective: Was back in a-fib with RVR on 5/5 while in HD. Placed back on amiodarone gtt as he is not taking anything by mouth 2/2 ileus. Converted to NSR at 11:00 PM on 5/5 on the above amiodarone gtt. No complaints of SOB, chest pain, palpitations. Chest CT on 5/5 showed interval development of moderate pericardial effusion measuring 3.2 cm along the left lateral ventricle Bilateral small pleural effusions, left greater than right with bibasilar atelectasis. Multivessel CAD. Distal aortic arch measure 4.7 cm in diameter. Echo (5/6) with EF 45-50%, diffuse hypokinesis, moderate pericardial effusion without tamponade.   Remains in NSR this morning. BP stable, denies dyspnea. Still NPO s/p NG tube with ice chips. Currently on TPN for nutrition.   Review of Systems: Review of Systems  Constitutional: Positive for malaise/fatigue. Negative for fever.  Respiratory: Negative for cough, hemoptysis, sputum production, shortness of breath and wheezing.   Cardiovascular: Negative for chest pain and palpitations.  Neurological: Positive for weakness.    Objective: Telemetry: NSR, 80s, converted to NSR at 11 PM on 5/5 Physical Exam: Blood pressure 107/63, pulse 86, temperature 98 F (36.7 C), temperature source Oral, resp. rate 20, height 5' 6.97" (1.701 m), weight 210 lb 12.8 oz (95.618 kg), SpO2 98 %. Body mass index is 33.05 kg/(m^2). General: Well developed, well nourished, in no acute distress. Head: Normocephalic, atraumatic, sclera non-icteric, no xanthomas, nares are without discharge. Neck: Negative for carotid bruits. JVP not elevated. Lungs: Clear bilaterally to auscultation without wheezes, rales, or rhonchi. Breathing is unlabored. Heart: RRR S1 S2 without murmurs, rubs, or gallops.  Abdomen: Soft, non-tender, non-distended with normoactive bowel sounds. No  rebound/guarding. Extremities: No clubbing or cyanosis. No edema. Distal pedal pulses are 2+ and equal bilaterally. Neuro: Alert and oriented X 3. Moves all extremities spontaneously. Psych:  Responds to questions appropriately with a normal affect.   Intake/Output Summary (Last 24 hours) at 01/30/15 1225 Last data filed at 01/30/15 1134  Gross per 24 hour  Intake 2420.99 ml  Output   1900 ml  Net 520.99 ml    Inpatient Medications:  . atorvastatin  40 mg Oral Daily  . ciprofloxacin  400 mg Intravenous Q24H  . insulin aspart  0-9 Units Subcutaneous Q6H  . pantoprazole (PROTONIX) IV  40 mg Intravenous Q24H  . sodium chloride  10-40 mL Intracatheter Q12H  . sulfamethoxazole-trimethoprim  2 tablet Oral Q24H   Infusions:  . amiodarone 30 mg/hr (01/30/15 0030)  . TPN (CLINIMIX) Adult without lytes     And  . [START ON 01/31/2015] fat emulsion    . TPN (CLINIMIX) Adult without lytes 40 mL/hr at 01/30/15 0729    Labs:  Recent Labs  01/29/15 0633 01/30/15 0605  NA 139 137  K 3.6 3.5  CL 101 101  CO2 30 27  GLUCOSE 115* 111*  BUN 20 28*  CREATININE 4.64* 6.29*  CALCIUM 7.5* 7.4*  MG 1.7 1.5*  PHOS 2.1* 3.3   No results for input(s): AST, ALT, ALKPHOS, BILITOT, PROT, ALBUMIN in the last 72 hours.  Recent Labs  01/29/15 0633 01/30/15 0605  WBC 10.9* 10.6  HGB 7.5* 7.5*  HCT 23.3* 22.3*  MCV 88.6 87.9  PLT 236 208   No results for input(s): CKTOTAL, CKMB, TROPONINI in the last 72 hours. Invalid input(s): POCBNP No results for input(s): HGBA1C in the last 72 hours.   Weights: [employer]  01/28/15 1400 01/29/15 0328 01/30/15 0446  Weight: 220 lb 7.4 oz (100 kg) 211 lb 6.7 oz (95.9 kg) 210 lb 12.8 oz (95.618 kg)     Radiology/Studies:  See Imaging results.    Assessment and Plan  74 y.o. male with history of ESRD on HD Tuesday, Thursday, and Saturday, prostate CA s/p BCG injections and Lupron injections, bladder CA s/p ureteral stents and neurogenic  bladder, abdominal aortic aneurysm and iliac aneurysm, status post repair who presented to [facility] ED on 4/27 in acute respiratory distress x 1 day and was found to be hypotensive with blood pressures in the 68T sytolic and was found to be in new onset a-fib with RVR.  1. New onset a-fib with RVR and hypotension:  -In setting of septic shock. -Brief episode of recurrence of a-fib with RVR on the afternoon of 5/5, was placed on amiodarone gtt  -Now back in NSR on amiodarone gtt.  -Continue amiodarone gtt, still has NGT and is NPO. -Holding anticoagulation for now given anemia -Would not use diltiazem given low EF.   2. Pericardial effusion:  -Moderate circumferential effusion on echo, no tamponade. -Hemodynamically stable    3. 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 EF 45-50% on echo this admission.  -He will need cardiac cath once stable, including anemia work up which is ongoing.  -Lipitor 40 mg daily -Once it is seen BP remains stable add low dose beta blocker (Coreg) if BP remains stable, maybe tomorrow. -Volume controlled by HD.   4. Septic shock/urosepsis:  -Enterobacter urosepsis.  -Abx per primary service.  -Hypotension resolved.   5. ESRD: -On HD  6. SBO:   -NG tube in place  7. Anemia: -Maintain hgb approximately 8   Signed, Christell Faith, PA-C 01/30/2015 12:31 PM

## 2015-01-31 ENCOUNTER — Inpatient Hospital Stay: Payer: Medicare Other

## 2015-01-31 LAB — PSA: PSA: 2.15 ng/mL (ref 0.00–4.00)

## 2015-01-31 LAB — BASIC METABOLIC PANEL
ANION GAP: 9 (ref 5–15)
BUN: 39 mg/dL — ABNORMAL HIGH (ref 6–20)
CALCIUM: 7.7 mg/dL — AB (ref 8.9–10.3)
CO2: 25 mmol/L (ref 22–32)
Chloride: 103 mmol/L (ref 101–111)
Creatinine, Ser: 7.03 mg/dL — ABNORMAL HIGH (ref 0.61–1.24)
GFR calc Af Amer: 8 mL/min — ABNORMAL LOW (ref >60)
GFR, EST NON AFRICAN AMERICAN: 7 mL/min — AB (ref >60)
Glucose, Bld: 110 mg/dL — ABNORMAL HIGH (ref 65–99)
Potassium: 3.6 mmol/L (ref 3.5–5.1)
SODIUM: 137 mmol/L (ref 135–145)

## 2015-01-31 LAB — H PYLORI, IGM, IGG, IGA AB
H. Pylogi, Iga Abs: 21.7 units — ABNORMAL HIGH (ref 0.0–8.9)
H. Pylogi, Igm Abs: <9 units (ref 0.0–8.9)

## 2015-01-31 LAB — GLUCOSE, CAPILLARY
GLUCOSE-CAPILLARY: 122 mg/dL — AB (ref 70–99)
GLUCOSE-CAPILLARY: 132 mg/dL — AB (ref 70–99)
Glucose-Capillary: 127 mg/dL — ABNORMAL HIGH (ref 70–99)

## 2015-01-31 LAB — PHOSPHORUS: Phosphorus: 3.6 mg/dL (ref 2.5–4.6)

## 2015-01-31 LAB — MAGNESIUM: MAGNESIUM: 1.7 mg/dL (ref 1.7–2.4)

## 2015-01-31 LAB — TESTOSTERONE: TESTOSTERONE: 28 ng/dL — AB (ref 348–1197)

## 2015-01-31 MED ORDER — TRACE MINERALS CR-CU-MN-SE-ZN 10-1000-500-60 MCG/ML IV SOLN
INTRAVENOUS | Status: AC
  Administered 2015-01-31: 19:00:00 via INTRAVENOUS
  Filled 2015-01-31: qty 1440

## 2015-01-31 NOTE — Progress Notes (Signed)
Nutrition Follow-up  DOCUMENTATION CODES:  INTERVENTION: PN: Continue TPN of 5% AA/ 15% Dextrose without electrolytes at 21ml/ hr to provide 1165 kcal, 72g Pro, and 1940 ml fluid over next 24 hrs. When patient meeting 60% of estimated energy needs via PO diet, will recommend d/c TPN. Meals and Snacks: Cater to patient preferences as able   NUTRITION DIAGNOSIS:  Inadequate oral intake related to altered GI function continues but being addressed with TPN and clear liquid diet.   GOAL:  Provide needs based on ASPEN/SCCM guidelines, Patient will meet greater than or equal to 90% of their needs   MONITOR:  Diet advancement, Labs, Weight trends, I & O's  REASON FOR ASSESSMENT:  Other (Comment) (RD Follow Up)   ASSESSMENT: Clinical Update:  Pt started on a CLD; ileus resolving, weight increasing, NGT out Typical Food/ Fluid Intake: No intake of CL diet recorded Weight Changes: Increase of 3# from last weight Labs: Phos WDL, Mg WDL Electrolyte and Renal Profile:    Recent Labs Lab 01/29/15 0633 01/30/15 0605 01/31/15 0520  BUN 20 28* 39*  CREATININE 4.64* 6.29* 7.03*  NA 139 137 137  K 3.6 3.5 3.6  MG 1.7 1.5* 1.7  PHOS 2.1* 3.3 3.6    Meds: Novolog Physical Findings: n/a  Height:  Ht Readings from Last 1 Encounters:  01/20/15 5' 6.97" (1.701 m)    Weight:  Wt Readings from Last 1 Encounters:  01/31/15 213 lb 6.4 oz (96.798 kg)    Ideal Body Weight:     Wt Readings from Last 10 Encounters:  01/31/15 213 lb 6.4 oz (96.798 kg)    BMI:  Body mass index is 33.45 kg/(m^2).  Estimated Nutritional Needs:  Kcal:  5916-3846 kcal/ day (BEE: 1373 x 1.2 AF x 1.0-1.2 IF)- using IBW of 67 kg  Protein:  80-101 g Pro/day (1.2-1.5 g Pro/ kg/ day)- using IBW of 67 kg  Fluid:  1057ml + UOP  Skin:  Wound (see comment) (Stage II Sacrum)  Diet Order:  TPN (CLINIMIX) Adult without lytes Diet clear liquid Room service appropriate?: Yes; Fluid consistency::  Thin  TPN of 5%/15% at 51ml/ hr with lipids on Tuesday  EDUCATION NEEDS:  No education needs identified at this time   Intake/Output Summary (Last 24 hours) at 01/31/15 1303 Last data filed at 01/31/15 1033  Gross per 24 hour  Intake     30 ml  Output   1150 ml  Net  -1120 ml    Last BM:  5/7  Roda Shutters, RDN Pager: [phone] Office: [place]

## 2015-01-31 NOTE — Progress Notes (Signed)
MEDICATION RELATED CONSULT NOTE - FOLLOW UP   Pharmacy Consult for Renal dosing in HD Patient Indication: Medication adjustments in HD patient  Allergies  Allergen Reactions  . Penicillins Hives    Patient Measurements: Height: 5' 6.97" (170.1 cm) Weight: 213 lb 6.4 oz (96.798 kg) IBW/kg (Calculated) : 66.03   Labs:  Recent Labs  01/29/15 0633 01/30/15 0605 01/31/15 0520  WBC 10.9* 10.6  --   HGB 7.5* 7.5*  --   HCT 23.3* 22.3*  --   PLT 236 208  --   CREATININE 4.64* 6.29* 7.03*  MG 1.7 1.5* 1.7  PHOS 2.1* 3.3 3.6   Estimated Creatinine Clearance: 10.4 mL/min (by C-G formula based on Cr of 7.03).     Medications:  Prescriptions prior to admission  Medication Sig Dispense Refill Last Dose  . amLODipine (NORVASC) 5 MG tablet Take 5 mg by mouth at bedtime.     . budesonide-formoterol (SYMBICORT) 160-4.5 MCG/ACT inhaler Inhale 2 puffs into the lungs 2 (two) times daily.     . chlorpheniramine (CHLOR-TRIMETON) 4 MG tablet Take 4 mg by mouth every 4 (four) hours as needed for allergies.     Marland Kitchen loperamide (IMODIUM) 2 MG capsule Take 2 mg by mouth every 4 (four) hours as needed for diarrhea or loose stools.     . magnesium oxide (MAG-OX) 400 MG tablet Take 400 mg by mouth at bedtime.     . NONFORMULARY OR COMPOUNDED ITEM Take 1 tablet by mouth daily. Rena-Vite Vitamin B Complex with C and Folic Acid     . sevelamer carbonate (RENVELA) 800 MG tablet Take 800 mg by mouth 3 (three) times daily with meals.     . Tiotropium Bromide Monohydrate 2.5 MCG/ACT AERS Inhale 1 puff into the lungs daily.      Scheduled:  . atorvastatin  40 mg Oral Daily  . ciprofloxacin  400 mg Intravenous Q24H  . insulin aspart  0-9 Units Subcutaneous Q6H  . pantoprazole (PROTONIX) IV  40 mg Intravenous Q24H  . sodium chloride  10-40 mL Intracatheter Q12H  . sulfamethoxazole-trimethoprim  2 tablet Oral Q24H    Assessment:  Patient with ESRD requiring HD on Tues, Thurs, Sat schedule.  Goal of  Therapy:  Adjustment of medications for renal function.  Plan:  No adjustments warranted at this time. Pharmacy will continue to follow.  Murrell Converse, PharmD Clinical Pharmacist 01/31/2015

## 2015-01-31 NOTE — Consult Note (Signed)
Subjective: Patient seen for blood-tinged gastric aspirates and ileus. Patient is doing better. Has been improvement in serial x-rays. Had his nasogastric tube capped overnight and there was no accumulation or distention increased distention. He has been started on some clear liquids. He is apparently passing flatus but no stool yet.  Objective: Vital signs in last 24 hours: Temp:  [97.7 F (36.5 C)-98.4 F (36.9 C)] 98.2 F (36.8 C) (05/10 1650) Pulse Rate:  [74-88] 84 (05/10 1650) Resp:  [18-30] 18 (05/10 1650) BP: (77-104)/(45-62) 104/56 mmHg (05/10 1650) SpO2:  [96 %-100 %] 100 % (05/10 1650) Weight:  [96.798 kg (213 lb 6.4 oz)-96.934 kg (213 lb 11.2 oz)] 96.934 kg (213 lb 11.2 oz) (05/10 1650) Blood pressure 104/56, pulse 84, temperature 98.2 F (36.8 C), temperature source Oral, resp. rate 18, height 5' 6.97" (1.701 m), weight 96.934 kg (213 lb 11.2 oz), SpO2 100 %.   Intake/Output from previous day: 05/09 0701 - 05/10 0700 In: 554.3 [I.V.:30; NG/GT:25; TPN:499.3] Out: 1350 [Urine:1250; Emesis/NG output:100]  Intake/Output this shift: Total I/O In: -  Out: 973 [Urine:300; Other:369]   General appearance:  Elderly 74 year old American male distress Resp:  Clear to auscultation Cardio:  Regular rate and rhythm GI:  Soft nontender nondistended bowel sounds positive normoactive Extremities:  Trace edema 1+ edema   Lab Results: Results for orders placed or performed during the hospital encounter of 01/18/15 (from the past 24 hour(s))  Glucose, capillary     Status: Abnormal   Collection Time: 01/30/15  9:25 PM  Result Value Ref Range   Glucose-Capillary 119 (H) 70 - 99 mg/dL  Basic metabolic panel     Status: Abnormal   Collection Time: 01/31/15  5:20 AM  Result Value Ref Range   Sodium 137 135 - 145 mmol/L   Potassium 3.6 3.5 - 5.1 mmol/L   Chloride 103 101 - 111 mmol/L   CO2 25 22 - 32 mmol/L   Glucose, Bld 110 (H) 65 - 99 mg/dL   BUN 39 (H) 6 - 20 mg/dL    Creatinine, Ser 7.03 (H) 0.61 - 1.24 mg/dL   Calcium 7.7 (L) 8.9 - 10.3 mg/dL   GFR calc non Af Amer 7 (L) >60 mL/min   GFR calc Af Amer 8 (L) >60 mL/min   Anion gap 9 5 - 15  Magnesium     Status: None   Collection Time: 01/31/15  5:20 AM  Result Value Ref Range   Magnesium 1.7 1.7 - 2.4 mg/dL  Phosphorus     Status: None   Collection Time: 01/31/15  5:20 AM  Result Value Ref Range   Phosphorus 3.6 2.5 - 4.6 mg/dL  Glucose, capillary     Status: Abnormal   Collection Time: 01/31/15  8:36 AM  Result Value Ref Range   Glucose-Capillary 122 (H) 70 - 99 mg/dL   Comment 1 Notify RN   Glucose, capillary     Status: Abnormal   Collection Time: 01/31/15  4:48 PM  Result Value Ref Range   Glucose-Capillary 132 (H) 70 - 99 mg/dL   Comment 1 Notify RN       Recent Labs  01/29/15 0633 01/30/15 0605  WBC 10.9* 10.6  HGB 7.5* 7.5*  HCT 23.3* 22.3*  PLT 236 208   BMET  Recent Labs  01/29/15 0633 01/30/15 0605 01/31/15 0520  NA 139 137 137  K 3.6 3.5 3.6  CL 101 101 103  CO2 30 27 25   GLUCOSE 115* 111* 110*  BUN 20 28* 39*  CREATININE 4.64* 6.29* 7.03*  CALCIUM 7.5* 7.4* 7.7*   LFT No results for input(s): PROT, ALBUMIN, AST, ALT, ALKPHOS, BILITOT, BILIDIR, IBILI in the last 72 hours. PT/INR No results for input(s): LABPROT, INR in the last 72 hours. Hepatitis Panel No results for input(s): HEPBSAG, HCVAB, HEPAIGM, HEPBIGM in the last 72 hours. C-Diff No results for input(s): CDIFFTOX in the last 72 hours. No results for input(s): CDIFFPCR in the last 72 hours.   Studies/Results: Dg Chest 2 View  01/30/2015   CLINICAL DATA:  Shortness of breath  EXAM: CHEST  2 VIEW  COMPARISON:  CT chest dated 01/26/2015  FINDINGS: Patchy bilateral lower lobe opacities, likely atelectasis. Moderate left pleural effusion. No pneumothorax.  Right IJ venous catheter terminates in the mid SVC.  Cardiomegaly with pericardial effusion on prior CT.  Enteric tube courses below the diaphragm.   IMPRESSION: Moderate left pleural effusion with bilateral lower lobe atelectasis.  Cardiomegaly with pericardial effusion on prior CT.   Electronically Signed   By: Julian Hy M.D.   On: 01/30/2015 08:47   US Renal  01/30/2015   CLINICAL DATA:  Subsequent encounter for hydronephrosis  EXAM: RENAL / URINARY TRACT ULTRASOUND COMPLETE  COMPARISON:  CT scan from 01/18/2015.  FINDINGS: Right Kidney:  Length: 11.5 cm. Collecting system fullness noted without overt hydronephrosis.  Left Kidney:  Length: 11.2 cm. Mild fullness of the intrarenal collecting system noted. Echogenic structure identified in the interpolar region presumably related to internal ureteral stent.  Bladder:  Decompressed by Foley catheter.  Note:  Nodule seen adjacent to the upper pole the right kidney compatible with adrenal nodularity seen on previous CT scan. 15 mm gallstone noted.  IMPRESSION: Mild fullness of both intrarenal collecting systems. Urinary bladder is decompressed by Foley catheter.  Cholelithiasis.  Right adrenal nodularity.   Electronically Signed   By: Misty Stanley M.D.   On: 01/30/2015 10:33   Dg Abd 2 Views  01/31/2015   CLINICAL DATA:  Mild small bowel ileus  EXAM: ABDOMEN - 2 VIEW  COMPARISON:  01/30/2015  FINDINGS: Nasogastric catheter remains in place. Bilateral ureteral stents as well as an aortic stent graft are noted. No free intraperitoneal air is seen. Scattered large and small bowel gas is noted. A few mildly dilated loops of small bowel are noted although the overall appearance has improved slightly in the interval from the prior exam.  IMPRESSION: Slight improvement in the degree of small bowel dilatation.   Electronically Signed   By: Inez Catalina M.D.   On: 01/31/2015 08:19   Dg Abd 2 Views  01/30/2015   CLINICAL DATA:  Adynamic ileus.  EXAM: ABDOMEN - 2 VIEW  COMPARISON:  Jan 27, 2015.  FINDINGS: Small bowel dilatation seen on prior exam is significantly improved, although residual dilatation remains  in left lower quadrant of the abdomen. Distal tip of nasogastric tube is seen in proximal stomach. Bilateral ureteral stents are again noted and unchanged. Status post stent graft repair of abdominal aortic aneurysm. Coil embolization of both internal iliac arteries is noted. Degenerative change of both hip joints is noted.  IMPRESSION: Mild small bowel dilatation is noted which is significantly improved compared to prior exam, most consistent with resolving ileus.   Electronically Signed   By: Marijo Conception, M.D.   On: 01/30/2015 08:42    Scheduled Inpatient Medications:   . atorvastatin  40 mg Oral Daily  . ciprofloxacin  400 mg Intravenous Q24H  .  insulin aspart  0-9 Units Subcutaneous Q6H  . pantoprazole (PROTONIX) IV  40 mg Intravenous Q24H  . sodium chloride  10-40 mL Intracatheter Q12H  . sulfamethoxazole-trimethoprim  2 tablet Oral Q24H    Continuous Inpatient Infusions:   . amiodarone 30 mg/hr (01/31/15 1111)  . TPN (CLINIMIX) Adult without lytes 60 mL/hr at 01/30/15 1849   And  . fat emulsion 500 mL (01/31/15 1112)  . TPN (CLINIMIX) Adult without lytes      PRN Inpatient Medications:  acetaminophen, diphenhydrAMINE, ondansetron (ZOFRAN) IV, phenol, sodium chloride, sodium chloride  Miscellaneous:   Assessment:  1. No recurrence of gastric aspirate. 2. Ileus improving  Plan:  1. Restarting clear liquid diet. Advance as tolerated.  Lollie Sails MD 01/31/2015, 5:52 PM

## 2015-01-31 NOTE — Progress Notes (Signed)
PARENTERAL NUTRITION CONSULT NOTE - FOLLOW UP  Pharmacy Consult for TPN Electrolyte/Glucose MGMT  Allergies  Allergen Reactions  . Penicillins Hives    Patient Measurements: Height: 5' 6.97" (170.1 cm) Weight: 213 lb 6.4 oz (96.798 kg) IBW/kg (Calculated) : 66.03  Vital Signs: Temp: 97.8 F (36.6 C) (05/10 0517) Temp Source: Oral (05/09 2028) BP: 95/57 mmHg (05/10 0517) Pulse Rate: 80 (05/10 0517) Intake/Output from previous day: 05/09 0701 - 05/10 0700 In: 529.3 [I.V.:30; TPN:499.3] Out: 1350 [Urine:1250; Emesis/NG output:100] Intake/Output from this shift: Total I/O In: 30 [I.V.:30] Out: 850 [Urine:850]  Labs:  Recent Labs  01/29/15 0633 01/30/15 0605  WBC 10.9* 10.6  HGB 7.5* 7.5*  HCT 23.3* 22.3*  PLT 236 208     Recent Labs  01/29/15 0633 01/30/15 0605 01/31/15 0520  NA 139 137 137  K 3.6 3.5 3.6  CL 101 101 103  CO2 30 27 25   GLUCOSE 115* 111* 110*  BUN 20 28* 39*  CREATININE 4.64* 6.29* 7.03*  CALCIUM 7.5* 7.4* 7.7*  MG 1.7 1.5* 1.7  PHOS 2.1* 3.3 3.6   Estimated Creatinine Clearance: 10.4 mL/min (by C-G formula based on Cr of 7.03).    Recent Labs  01/30/15 1013 01/30/15 1413 01/30/15 2125  GLUCAP 130* 114* 119*    Medications:  Scheduled:  . atorvastatin  40 mg Oral Daily  . ciprofloxacin  400 mg Intravenous Q24H  . insulin aspart  0-9 Units Subcutaneous Q6H  . pantoprazole (PROTONIX) IV  40 mg Intravenous Q24H  . sodium chloride  10-40 mL Intracatheter Q12H  . sulfamethoxazole-trimethoprim  2 tablet Oral Q24H   Infusions:  . amiodarone 30 mg/hr (01/30/15 1440)  . TPN (CLINIMIX) Adult without lytes 60 mL/hr at 01/30/15 1849   And  . fat emulsion      Insulin Requirements in the past 24 hours:  1 unit SSI  Current Nutrition:  Clinimix 5/15 with MVI and trace elements at 60 ml/hr.   Assessment: Electrolytes are wnl  Only 1 unit of SSI used in last 24 hours.   Plan:  No need for supplementation. Will f/u AM labs.    Ulice Dash D 01/31/2015,6:54 AM

## 2015-01-31 NOTE — Progress Notes (Signed)
Rehab Admissions Coordinator Note:  Patient was screened by Retta Diones for appropriateness for an Inpatient Acute Rehab Consult.  At this time, we are recommending [place]. Patient has [facility] [employer].  Given current diagnosis, it is very unlikely that insurance carrier would approve an acute inpatient rehab admission.  Likely will need to pursue SNF placement.  Call me for questions.    Jodell Cipro M 01/31/2015, 9:03 AM  I can be reached at [phone].

## 2015-01-31 NOTE — Progress Notes (Signed)
Speech Language Pathology Treatment: Dysphagia  Patient Details Name: SAHMIR WEATHERBEE MRN: [id number] DOB: [birth year] Today's Date: 01/31/2015 Time: 3500-9381 SLP Time Calculation (min) (ACUTE ONLY): 45 min  Assessment / Plan / Recommendation Clinical Impression  Pt appeared to tolerate trials of thin liquids following general aspiration precautions; he appears at reduced risk for aspiration at this time w/ general aspiration precautions. Pt consumed po trials w/ clear vocal quality b/t trials during conversation w/ SLP. Pt does require assistance w/ feeding at this time d/t overall weakness. MD has given order for a clear liquid at this time; SLP will f/u w/ assessment of consistency trials as diet consistency upgrades next 1-3 days. NSG updated.    HPI Other Pertinent Information: pt w/ NG clamped overnight; NSG to remove. Pt alert, awake. Vocal quality remains at his baseline if reduced volume, breathy. He has been NPO d/t ileus and NG placement.   Pertinent Vitals Pain Assessment: No/denies pain  SLP Plan  Continue with current plan of care;Other (Comment) (ST f/u for toleration of diet as diet consistency upgrades next 1-3 days. )    Recommendations Diet recommendations: Thin liquid;Other(comment) (clear liquid diet at this time per GI) Liquids provided via: Cup;Straw Medication Administration: Whole meds with puree Supervision: Staff to assist with self feeding;Intermittent supervision to cue for compensatory strategies Compensations: Slow rate;Small sips/bites;Other (Comment) (rest breaks b/t ~2-3 trials to avoid SOB/WOB) Postural Changes and/or Swallow Maneuvers: Seated upright 90 degrees              Oral Care Recommendations: Oral care BID Plan: Continue with current plan of care;Other (Comment) (ST f/u for toleration of diet as diet consistency upgrades next 1-3 days. )    GO     Watson,Katherine 01/31/2015, 11:48 AM

## 2015-01-31 NOTE — Care Management (Signed)
Informed by [facility] that patient would not be approved for Acute inpatient rehab by patient's insurance.  Will continue to pursue skilled nursing

## 2015-01-31 NOTE — Progress Notes (Signed)
Occupational Therapy Treatment Patient Details Name: HARLOW CARRIZALES MRN: [id number] DOB: Nov 22, 1940 Today's Date: 01/31/2015    History of present illness 74 yo male with onset of septic shock and respriatory distress, hypotension and atelectasis, recent bladder history, PMHx:  a-fib with RVR, prostate CA, ESRD T TH S, neurogenic bladder, AAA, anemia,    OT comments  Pt seen for 15 minutes for theraputty (green) exercises and ball to work on hand grip.  Pt alert but fatigued easily after completing 5 different grasp and pinch exercises and stated that was enough exercises for today as indicated by patient shaking his head no and whispering "that's enough for today, thank you."  Pt to have dialysis today.  He is making good progress toward goals but slow due to decreased stamina for participation.  Follow Up Recommendations       Equipment Recommendations       Recommendations for Other Services      Precautions / Restrictions Precautions Precautions: Fall Restrictions Weight Bearing Restrictions: No       Mobility Bed Mobility                  Transfers                      Balance                                   ADL                                                Vision                     Perception     Praxis      Cognition   Behavior During Therapy: WFL for tasks assessed/performed Overall Cognitive Status: Within Functional Limits for tasks assessed                       Extremity/Trunk Assessment               Exercises     Shoulder Instructions       General Comments      Pertinent Vitals/ Pain       Pain Assessment: No/denies pain  Home Living                                          Prior Functioning/Environment              Frequency Min 1X/week     Progress Toward Goals  OT Goals(current goals can now be found in the care plan  section)  Progress towards OT goals: Progressing toward goals  Acute Rehab OT Goals Patient Stated Goal: I want to get my strength back  Plan Discharge plan remains appropriate    Co-evaluation          OT goals addressed during session: Strengthening/ROM      End of Session Equipment Utilized During Treatment: Other (comment) (theraputty and ball for hand exercises )   Activity Tolerance Patient limited by fatigue   Patient Left in bed;with bed alarm set   Nurse  Communication          Time: 1505-6979 OT Time Calculation (min): 15 min  Charges: OT General Charges $OT Visit: 1 Procedure OT Treatments $Therapeutic Exercise: 8-22 mins  Buddie Marston 01/31/2015, 12:06 PM    Chrys Racer, OTR/L

## 2015-01-31 NOTE — Progress Notes (Signed)
CC: Ileus Subjective: Patient describes no nausea or vomiting with the nasogastric tube clamped. He has passed gas but has not had a bowel movement. He has no abdominal pain at this time.  Objective: Vital signs in last 24 hours: Temp:  [97.8 F (36.6 C)-98.3 F (36.8 C)] 97.8 F (36.6 C) (05/10 0517) Pulse Rate:  [80] 80 (05/10 0517) Resp:  [18] 18 (05/10 0517) BP: (90-99)/(45-57) 95/57 mmHg (05/10 0517) SpO2:  [96 %-98 %] 98 % (05/10 0517) Weight:  [96.798 kg (213 lb 6.4 oz)] 96.798 kg (213 lb 6.4 oz) (05/10 0517) Last BM Date: 01/28/15  Intake/Output from previous day: 05/09 0701 - 05/10 0700 In: 529.3 [I.V.:30; TPN:499.3] Out: 1350 [Urine:1250; Emesis/NG output:100] Intake/Output this shift: Total I/O In: -  Out: 300 [Urine:300]  Physical exam:  Abdomen is soft nontender moderately distended minimally tympanitic and unchanged from previous exam.  Lab Results: CBC   Recent Labs  01/29/15 0633 01/30/15 0605  WBC 10.9* 10.6  HGB 7.5* 7.5*  HCT 23.3* 22.3*  PLT 236 208   BMET  Recent Labs  01/30/15 0605 01/31/15 0520  NA 137 137  K 3.5 3.6  CL 101 103  CO2 27 25  GLUCOSE 111* 110*  BUN 28* 39*  CREATININE 6.29* 7.03*  CALCIUM 7.4* 7.7*   PT/INR No results for input(s): LABPROT, INR in the last 72 hours. ABG No results for input(s): PHART, HCO3 in the last 72 hours.  Invalid input(s): PCO2, PO2  Studies/Results: Dg Chest 2 View  01/30/2015   CLINICAL DATA:  Shortness of breath  EXAM: CHEST  2 VIEW  COMPARISON:  CT chest dated 01/26/2015  FINDINGS: Patchy bilateral lower lobe opacities, likely atelectasis. Moderate left pleural effusion. No pneumothorax.  Right IJ venous catheter terminates in the mid SVC.  Cardiomegaly with pericardial effusion on prior CT.  Enteric tube courses below the diaphragm.  IMPRESSION: Moderate left pleural effusion with bilateral lower lobe atelectasis.  Cardiomegaly with pericardial effusion on prior CT.   Electronically  Signed   By: Julian Hy M.D.   On: 01/30/2015 08:47   US Renal  01/30/2015   CLINICAL DATA:  Subsequent encounter for hydronephrosis  EXAM: RENAL / URINARY TRACT ULTRASOUND COMPLETE  COMPARISON:  CT scan from 01/18/2015.  FINDINGS: Right Kidney:  Length: 11.5 cm. Collecting system fullness noted without overt hydronephrosis.  Left Kidney:  Length: 11.2 cm. Mild fullness of the intrarenal collecting system noted. Echogenic structure identified in the interpolar region presumably related to internal ureteral stent.  Bladder:  Decompressed by Foley catheter.  Note:  Nodule seen adjacent to the upper pole the right kidney compatible with adrenal nodularity seen on previous CT scan. 15 mm gallstone noted.  IMPRESSION: Mild fullness of both intrarenal collecting systems. Urinary bladder is decompressed by Foley catheter.  Cholelithiasis.  Right adrenal nodularity.   Electronically Signed   By: Misty Stanley M.D.   On: 01/30/2015 10:33   Dg Abd 2 Views  01/31/2015   CLINICAL DATA:  Mild small bowel ileus  EXAM: ABDOMEN - 2 VIEW  COMPARISON:  01/30/2015  FINDINGS: Nasogastric catheter remains in place. Bilateral ureteral stents as well as an aortic stent graft are noted. No free intraperitoneal air is seen. Scattered large and small bowel gas is noted. A few mildly dilated loops of small bowel are noted although the overall appearance has improved slightly in the interval from the prior exam.  IMPRESSION: Slight improvement in the degree of small bowel dilatation.  Electronically Signed   By: Inez Catalina M.D.   On: 01/31/2015 08:19   Dg Abd 2 Views  01/30/2015   CLINICAL DATA:  Adynamic ileus.  EXAM: ABDOMEN - 2 VIEW  COMPARISON:  Jan 27, 2015.  FINDINGS: Small bowel dilatation seen on prior exam is significantly improved, although residual dilatation remains in left lower quadrant of the abdomen. Distal tip of nasogastric tube is seen in proximal stomach. Bilateral ureteral stents are again noted and  unchanged. Status post stent graft repair of abdominal aortic aneurysm. Coil embolization of both internal iliac arteries is noted. Degenerative change of both hip joints is noted.  IMPRESSION: Mild small bowel dilatation is noted which is significantly improved compared to prior exam, most consistent with resolving ileus.   Electronically Signed   By: Marijo Conception, M.D.   On: 01/30/2015 08:42    Anti-infectives: Anti-infectives    Start     Dose/Rate Route Frequency Ordered Stop   01/30/15 2200  sulfamethoxazole-trimethoprim (BACTRIM DS,SEPTRA DS) 800-160 MG per tablet 2 tablet     2 tablet Oral Every 24 hours 01/30/15 0818     01/27/15 2200  sulfamethoxazole-trimethoprim (BACTRIM DS,SEPTRA DS) 800-160 MG per tablet 1 tablet  Status:  Discontinued     1 tablet Oral Every 12 hours 01/27/15 1610 01/30/15 0818   01/27/15 1800  ciprofloxacin (CIPRO) tablet 500 mg  Status:  Discontinued    Comments:  Patient started Ciprofloxacin on 5/3.   500 mg Oral Daily 01/27/15 0812 01/27/15 1446   01/27/15 1800  ciprofloxacin (CIPRO) IVPB 400 mg    Comments:  Patient started Ciprofloxacin on 5/3.   400 mg 200 mL/hr over 60 Minutes Intravenous Every 24 hours 01/27/15 1446     01/24/15 1800  ciprofloxacin (CIPRO) IVPB 400 mg  Status:  Discontinued     400 mg 200 mL/hr over 60 Minutes Intravenous Every 24 hours 01/24/15 1521 01/27/15 0812   01/24/15 1200  vancomycin (VANCOCIN) IVPB 1000 mg/200 mL premix  Status:  Discontinued     1,000 mg 200 mL/hr over 60 Minutes Intravenous Every T-Th-Sa (Hemodialysis) 01/21/15 1551 01/24/15 1520   01/22/15 2100  meropenem (MERREM) 1 g in sodium chloride 0.9 % 100 mL IVPB  Status:  Discontinued     1 g 200 mL/hr over 30 Minutes Intravenous Every 24 hours 01/21/15 1219 01/22/15 1336   01/22/15 2100  meropenem (MERREM) 500 mg in sodium chloride 0.9 % 50 mL IVPB  Status:  Discontinued     500 mg 100 mL/hr over 30 Minutes Intravenous Every 24 hours 01/22/15 1336 01/24/15  1520   01/22/15 0000  vancomycin (VANCOCIN) IVPB 1000 mg/200 mL premix  Status:  Discontinued     1,000 mg 200 mL/hr over 60 Minutes Intravenous Every Dialysis 01/21/15 1437 01/21/15 1551      Assessment/Plan:    Patient with an ileus and no sign of obvious obstruction. He has tolerated having his nasogastric tube clamped. My recommendations at this point would be to remove his nasogastric tube and start a clear liquid diet. I will reexamine the patient for progress.Florene Glen, MD, FACS  01/31/2015

## 2015-01-31 NOTE — Progress Notes (Signed)
HD START 

## 2015-01-31 NOTE — Progress Notes (Signed)
[facility] NAME: [name]    MR#:  [id number]  DATE OF BIRTH:  Aug 28, 1941  SUBJECTIVE: Feels some what better. No fever.  CHIEF COMPLAINT:  No new complains today.  REVIEW OF SYSTEMS:  CONSTITUTIONAL: No fever, fatigue or weakness.  EYES: No blurred or double vision.  EARS, NOSE, AND THROAT: No tinnitus or ear pain.  RESPIRATORY: No cough, shortness of breath, wheezing or hemoptysis.  CARDIOVASCULAR: No chest pain, orthopnea, edema.  GASTROINTESTINAL: No nausea, vomiting, diarrhea or abdominal pain.  GENITOURINARY: No dysuria, hematuria.  ENDOCRINE: No polyuria, nocturia,  HEMATOLOGY: No anemia, easy bruising or bleeding SKIN: No rash or lesion. MUSCULOSKELETAL: No joint pain or arthritis.   NEUROLOGIC: No tingling, numbness, weakness.  PSYCHIATRY: No anxiety or depression.   DRUG ALLERGIES:   Allergies  Allergen Reactions  . Penicillins Hives    VITALS:  Blood pressure 95/57, pulse 80, temperature 97.8 F (36.6 C), temperature source Oral, resp. rate 18, height 5' 6.97" (1.701 m), weight 96.798 kg (213 lb 6.4 oz), SpO2 98 %.  PHYSICAL EXAMINATION:  GENERAL:  74 y.o.-year-old patient lying in the bed with no acute distress.  EYES: Pupils equal, round, reactive to light and accommodation. No scleral icterus. Extraocular muscles intact.  HEENT: Head atraumatic, normocephalic. Oropharynx and nasopharynx clear. NGT in place. NECK:  Supple, no jugular venous distention. No thyroid enlargement, no tenderness.  LUNGS: Normal breath sounds bilaterally, no wheezing, rales,rhonchi or crepitation. No use of accessory muscles of respiration.  CARDIOVASCULAR: S1, S2 normal. No murmurs, rubs, or gallops.  ABDOMEN: Soft, nontender, nondistended. Sluggish bowel sounds present. No organomegaly or mass.  EXTREMITIES: No pedal edema, cyanosis, or clubbing.  NEUROLOGIC: Cranial nerves II through XII are intact. Muscle  strength 5/5 in all extremities. Sensation intact. Gait not checked.  PSYCHIATRIC: The patient is alert and oriented x 3.  SKIN: No obvious rash, lesion, or ulcer.    LABORATORY PANEL:   CBC  Recent Labs Lab 01/30/15 0605  WBC 10.6  HGB 7.5*  HCT 22.3*  PLT 208   ------------------------------------------------------------------------------------------------------------------  Chemistries   Recent Labs Lab 01/31/15 0520  NA 137  K 3.6  CL 103  CO2 25  GLUCOSE 110*  BUN 39*  CREATININE 7.03*  CALCIUM 7.7*  MG 1.7   ------------------------------------------------------------------------------------------------------------------  ---------------------------------------------------------------------------------------------------------------   ASSESSMENT AND PLAN:  21 m with Bladder/prostate cancer, ESRD on HD, hypertension here with confusion, SOB  * New onset Afib with RVR: due to sepsis, now back in NSR on amio drip. Still NPO and has NGT. - Holding anticoagulation for now given anemia - No diltiazem given low EF per cardio  * ileus: NPO for now, NGT suction since 01/22/15.  Appreciate surgical consult. conservative mgmt for now. As no oral intake for 6 days- Dietary involved to start TPN.  Xray shows significant improvement, and had a BM Saturday night- Appreciated surgery and GI inputs.  Likely can d/c NGT today.  * Septic shock due to UTI  Had ureteral stents- seen by ID, will c/s urology to see if stents will need to be replaced per ID.  BP stable.   on bactrim - can stop on 5/16 per ID.  repeated cx just 2000CFU gr neg rods.  Has ureteral stents and Hx of prostate cancer- may need stent removal and to continue foley - per urology, advised to follow in office.  * GI bleed  Have Occult blood positive in Gastric secretions, and  required 2 Units of blood transfusion on last 3 days.  Appreciate GI consult.  * ESRD on HD  manage per  nephrology  * Anemia of Chronic Disease with worsening due to GI loss  Transfused 1 unit PRBC 01/26/15 and 01/26/14.  Target Hb should be around 8 per cardiology.   * Pericardial effusion  Cardio on case- moderate Effusion per echo, but no temponade. EF is 45%  * Acute resp failure on admission  Aspiration?  Monitor.  Now on room air.  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 EF 45-50% on echo this admission.    He will need cardiac cath once stable, including anemia work up which is ongoing.    Lipitor 40 mg daily   can add low dose beta blocker (Coreg) if BP remains stable, maybe tomorrow.   Volume controlled by HD.    Overall prognosis is very poor due to multiple medical issues Spoke to Pt and his POA- [name] ( cousin)- they agreed to meet palliative care team to help decide.   All the records are reviewed and case discussed with nurse. Management plans discussed with the patient, family and they are in agreement.  CODE STATUS: Full Code  TOTAL TIME TAKING CARE OF THIS PATIENT: 35 minutes.   POSSIBLE D/C IN 3-4 DAYS, DEPENDING ON CLINICAL CONDITION.   Slow improvement in condition, will need rehab.   Vaughan Basta M.D on 01/31/2015 at 10:24 AM  Between 7am to 6pm - Pager - [phone]  After 6pm go to [email or web address] - password [email or web address] [facility]  Office  [phone]  CC: Primary care physician; Dion Body, MD

## 2015-01-31 NOTE — Care Management (Signed)
Per attending, ileus sx are much improved.  Will initiate clear liquids today.  He is receiving TPN.  Remains on amiodarone drip

## 2015-01-31 NOTE — Progress Notes (Signed)
Subjective:   Getting TPN.   No SOB Seen during dialysis. Tolerating well.  Objective:  Vital signs in last 24 hours:  Temp:  [97.7 F (36.5 C)-98.4 F (36.9 C)] 97.7 F (36.5 C) (05/10 1342) Pulse Rate:  [74-88] 83 (05/10 1500) Resp:  [18-30] 23 (05/10 1500) BP: (77-103)/(45-62) 83/54 mmHg (05/10 1500) SpO2:  [96 %-100 %] 100 % (05/10 1151) Weight:  [96.798 kg (213 lb 6.4 oz)] 96.798 kg (213 lb 6.4 oz) (05/10 0517)  Weight change: 1.179 kg (2 lb 9.6 oz) Filed Weights   01/29/15 0328 01/30/15 0446 01/31/15 0517  Weight: 95.9 kg (211 lb 6.7 oz) 95.618 kg (210 lb 12.8 oz) 96.798 kg (213 lb 6.4 oz)    Intake/Output: I/O last 3 completed shifts: In: 529.3 [I.V.:30] Out: 2200 [Urine:2100; Emesis/NG output:100]   Intake/Output this shift:  Total I/O In: -  Out: 300 [Urine:300]  General: not in acute distress Neck: supple, RIJ central line CVS: irregular rate and rhythm Resp: clear  ABD: +soft, +bowel sounds EXT: no edema Neuro: alert and oriented Access: Left arm AVF   Basic Metabolic Panel:  Recent Labs Lab 01/26/15 0510 01/27/15 0500 01/28/15 0454 01/29/15 0633 01/30/15 0605 01/31/15 0520  NA 141 140 139 139 137 137  K 3.3* 3.1* 2.8* 3.6 3.5 3.6  CL 100* 97* 98* 101 101 103  CO2 28 30 31 30 27 25   GLUCOSE 87 97 118* 115* 111* 110*  BUN 41* 26* 34* 20 28* 39*  CREATININE 8.00* 5.61* 6.72* 4.64* 6.29* 7.03*  CALCIUM 7.4* 7.5* 7.5* 7.5* 7.4* 7.7*  MG  --  1.8 1.8 1.7 1.5* 1.7  PHOS 6.2*  --  4.2 2.1* 3.3 3.6    Liver Function Tests:  Recent Labs Lab 01/25/15 1246 01/26/15 0510  ALBUMIN 2.3* 2.1*   No results for input(s): LIPASE, AMYLASE in the last 168 hours. No results for input(s): AMMONIA in the last 168 hours.  CBC:  Recent Labs Lab 01/26/15 0510 01/26/15 1745 01/27/15 0500 01/28/15 0454 01/29/15 0633 01/30/15 0605  WBC 8.9  --  11.3* 12.6* 10.9* 10.6  HGB 6.3* 7.5* 6.9* 7.7* 7.5* 7.5*  HCT 19.4* 23.3* 21.6* 23.1* 23.3* 22.3*   MCV 89.6  --  88.8 87.8 88.6 87.9  PLT 258  --  259 258 236 208    Cardiac Enzymes: No results for input(s): CKTOTAL, CKMB, CKMBINDEX, TROPONINI in the last 168 hours.  BNP: Invalid input(s): POCBNP  CBG:  Recent Labs Lab 01/30/15 0242 01/30/15 1013 01/30/15 1413 01/30/15 2125 01/31/15 0836  GLUCAP 116* 130* 114* 119* 122*    Microbiology: Results for orders placed or performed during the hospital encounter of 01/18/15  Culture, blood (single)     Status: None (Preliminary result)   Collection Time: 01/18/15  7:11 PM  Result Value Ref Range Status   Micro Text Report   Preliminary       COMMENT                   NO GROWTH IN 48 HOURS   ANTIBIOTIC                                                      Culture, blood (single)     Status: None (Preliminary result)   Collection Time: 01/18/15  7:11 PM  Result Value Ref Range Status   Micro Text Report   Preliminary       COMMENT                   NO GROWTH IN 48 HOURS   ANTIBIOTIC                                                      Urine culture     Status: None   Collection Time: 01/18/15  7:36 PM  Result Value Ref Range Status   Micro Text Report   Final       SOURCE: INDWELLING CATHETER    COMMENT                   NO GROWTH IN 36 HOURS   ANTIBIOTIC                                                      Urine culture     Status: None   Collection Time: 01/20/15  4:09 PM  Result Value Ref Range Status   Micro Text Report   Final       SOURCE: INDWELLING CATH    ORGANISM 1                2000 CFU/ML ENTEROBACTER SPECIES   COMMENT                   UNABLE TO FURTHER SPECIATE   COMMENT                   -   ANTIBIOTIC                    ORG#1     CEFAZOLIN                     R         CEFOXITIN                     R         CEFTRIAXONE                   R         CIPROFLOXACIN                 I         ERTAPENEM                     S         GENTAMICIN                    S         IMIPENEM                       S         LEVOFLOXACIN                  I         NITROFURANTOIN  I         TRIMETH/SULFA                 S             Coagulation Studies: No results for input(s): LABPROT, INR in the last 72 hours.  Urinalysis: No results for input(s): COLORURINE, LABSPEC, PHURINE, GLUCOSEU, HGBUR, BILIRUBINUR, KETONESUR, PROTEINUR, UROBILINOGEN, NITRITE, LEUKOCYTESUR in the last 72 hours.  Invalid input(s): APPERANCEUR    Imaging: Dg Chest 2 View  01/30/2015   CLINICAL DATA:  Shortness of breath  EXAM: CHEST  2 VIEW  COMPARISON:  CT chest dated 01/26/2015  FINDINGS: Patchy bilateral lower lobe opacities, likely atelectasis. Moderate left pleural effusion. No pneumothorax.  Right IJ venous catheter terminates in the mid SVC.  Cardiomegaly with pericardial effusion on prior CT.  Enteric tube courses below the diaphragm.  IMPRESSION: Moderate left pleural effusion with bilateral lower lobe atelectasis.  Cardiomegaly with pericardial effusion on prior CT.   Electronically Signed   By: Julian Hy M.D.   On: 01/30/2015 08:47   US Renal  01/30/2015   CLINICAL DATA:  Subsequent encounter for hydronephrosis  EXAM: RENAL / URINARY TRACT ULTRASOUND COMPLETE  COMPARISON:  CT scan from 01/18/2015.  FINDINGS: Right Kidney:  Length: 11.5 cm. Collecting system fullness noted without overt hydronephrosis.  Left Kidney:  Length: 11.2 cm. Mild fullness of the intrarenal collecting system noted. Echogenic structure identified in the interpolar region presumably related to internal ureteral stent.  Bladder:  Decompressed by Foley catheter.  Note:  Nodule seen adjacent to the upper pole the right kidney compatible with adrenal nodularity seen on previous CT scan. 15 mm gallstone noted.  IMPRESSION: Mild fullness of both intrarenal collecting systems. Urinary bladder is decompressed by Foley catheter.  Cholelithiasis.  Right adrenal nodularity.   Electronically Signed   By: Misty Stanley M.D.   On:  01/30/2015 10:33   Dg Abd 2 Views  01/31/2015   CLINICAL DATA:  Mild small bowel ileus  EXAM: ABDOMEN - 2 VIEW  COMPARISON:  01/30/2015  FINDINGS: Nasogastric catheter remains in place. Bilateral ureteral stents as well as an aortic stent graft are noted. No free intraperitoneal air is seen. Scattered large and small bowel gas is noted. A few mildly dilated loops of small bowel are noted although the overall appearance has improved slightly in the interval from the prior exam.  IMPRESSION: Slight improvement in the degree of small bowel dilatation.   Electronically Signed   By: Inez Catalina M.D.   On: 01/31/2015 08:19   Dg Abd 2 Views  01/30/2015   CLINICAL DATA:  Adynamic ileus.  EXAM: ABDOMEN - 2 VIEW  COMPARISON:  Jan 27, 2015.  FINDINGS: Small bowel dilatation seen on prior exam is significantly improved, although residual dilatation remains in left lower quadrant of the abdomen. Distal tip of nasogastric tube is seen in proximal stomach. Bilateral ureteral stents are again noted and unchanged. Status post stent graft repair of abdominal aortic aneurysm. Coil embolization of both internal iliac arteries is noted. Degenerative change of both hip joints is noted.  IMPRESSION: Mild small bowel dilatation is noted which is significantly improved compared to prior exam, most consistent with resolving ileus.   Electronically Signed   By: Marijo Conception, M.D.   On: 01/30/2015 08:42     Medications:   . amiodarone 30 mg/hr (01/31/15 1111)  . TPN (CLINIMIX) Adult without lytes 60 mL/hr at 01/30/15 1849   And  .  fat emulsion 500 mL (01/31/15 1112)  . TPN (CLINIMIX) Adult without lytes     . atorvastatin  40 mg Oral Daily  . ciprofloxacin  400 mg Intravenous Q24H  . insulin aspart  0-9 Units Subcutaneous Q6H  . pantoprazole (PROTONIX) IV  40 mg Intravenous Q24H  . sodium chloride  10-40 mL Intracatheter Q12H  . sulfamethoxazole-trimethoprim  2 tablet Oral Q24H   acetaminophen, diphenhydrAMINE,  ondansetron (ZOFRAN) IV, phenol, sodium chloride, sodium chloride  Assessment/ Plan:  74 y.o. 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 Hemodialysis  - Continue TTS schedule.   - Patient seen during dialysis Tolerating well  - treatment ended early due to dialyzer clotting  2. Anemia of chronic kidney disease: D63.1  - holding epo due to prostate cancer.  - transfusion on 5/5 and 5/6   3. Secondary Hyperparathyroidism: N25.81  - Currently on TPN, will monitor phos level.   4. Ileus: K56.7 NGT to suction. Continue supportive care. Appreciate surgery input. - placed on TPN,lipids added   5. A. Fib with RVR: I48.91  - followed by cardiology    LOS: [place] 5/10/20164:40 PM

## 2015-01-31 NOTE — Progress Notes (Signed)
PT Cancellation Note  Patient Details Name: [name] MRN: [id number] DOB: 03-24-1941   Cancelled Treatment:    Reason Eval/Treat Not Completed: Patient at procedure or test/unavailable.  In HD this afternoon.   Ramond Dial 01/31/2015, 3:11 PM   Mee Hives, PT MS Acute Rehab Dept. Number: [facility] [id number] and [place] [phone]

## 2015-01-31 NOTE — Progress Notes (Signed)
PRE HD   

## 2015-02-01 LAB — BASIC METABOLIC PANEL
Anion gap: 9 (ref 5–15)
BUN: 30 mg/dL — AB (ref 6–20)
CALCIUM: 7.5 mg/dL — AB (ref 8.9–10.3)
CO2: 29 mmol/L (ref 22–32)
CREATININE: 5.05 mg/dL — AB (ref 0.61–1.24)
Chloride: 99 mmol/L — ABNORMAL LOW (ref 101–111)
GFR calc Af Amer: 12 mL/min — ABNORMAL LOW (ref >60)
GFR calc non Af Amer: 10 mL/min — ABNORMAL LOW (ref >60)
Glucose, Bld: 102 mg/dL — ABNORMAL HIGH (ref 65–99)
Potassium: 3.3 mmol/L — ABNORMAL LOW (ref 3.5–5.1)
Sodium: 137 mmol/L (ref 135–145)

## 2015-02-01 LAB — GLUCOSE, CAPILLARY
Glucose-Capillary: 120 mg/dL — ABNORMAL HIGH (ref 70–99)
Glucose-Capillary: 119 mg/dL — ABNORMAL HIGH (ref 70–99)
Glucose-Capillary: 130 mg/dL — ABNORMAL HIGH (ref 70–99)
Glucose-Capillary: 114 mg/dL — ABNORMAL HIGH (ref 70–99)
Glucose-Capillary: 91 mg/dL (ref 70–99)

## 2015-02-01 LAB — MAGNESIUM: MAGNESIUM: 1.6 mg/dL — AB (ref 1.7–2.4)

## 2015-02-01 LAB — PHOSPHORUS: Phosphorus: 2.5 mg/dL (ref 2.5–4.6)

## 2015-02-01 MED ORDER — TRACE MINERALS CR-CU-MN-SE-ZN 10-1000-500-60 MCG/ML IV SOLN
INTRAVENOUS | Status: AC
  Administered 2015-02-01: 22:00:00 via INTRAVENOUS
  Filled 2015-02-01: qty 1440

## 2015-02-01 MED ORDER — MAGNESIUM SULFATE 2 GM/50ML IV SOLN
2.0000 g | Freq: Once | INTRAVENOUS | Status: AC
  Administered 2015-02-01: 2 g via INTRAVENOUS
  Filled 2015-02-01: qty 50

## 2015-02-01 NOTE — Progress Notes (Signed)
Patient is alert and oriented. Denied any acute pain. No respiratory distress noted. Requested for Benadryl 25 mg PRN for sleep. On re-assessment, patient was resting quietly with his eyes closed, respirations even and unlabored. Foley intact and draining clear yellow urine. TPN  running, no issues. NSR on the cardiac monitor. Amiodarone running at 02.2, no complications noted this shift.

## 2015-02-01 NOTE — Progress Notes (Signed)
PARENTERAL NUTRITION CONSULT NOTE - FOLLOW UP  Pharmacy Consult for TPN Electrolyte/Glucose MGMT  Allergies  Allergen Reactions  . Penicillins Hives    Patient Measurements: Height: 5' 6.97" (170.1 cm) Weight: 208 lb 14.4 oz (94.756 kg) IBW/kg (Calculated) : 66.03  Vital Signs: Temp: 97.9 F (36.6 C) (05/11 0532) BP: 97/47 mmHg (05/11 0532) Pulse Rate: 75 (05/11 0532) Intake/Output from previous day: 05/10 0701 - 05/11 0700 In: -  Out: 1819 [Urine:1450] Intake/Output from this shift:    Labs:  Recent Labs  01/30/15 0605  WBC 10.6  HGB 7.5*  HCT 22.3*  PLT 208     Recent Labs  01/30/15 0605 01/31/15 0520 02/01/15 0515  NA 137 137 137  K 3.5 3.6 3.3*  CL 101 103 99*  CO2 27 25 29   GLUCOSE 111* 110* 102*  BUN 28* 39* 30*  CREATININE 6.29* 7.03* 5.05*  CALCIUM 7.4* 7.7* 7.5*  MG 1.5* 1.7 1.6*  PHOS 3.3 3.6 2.5   Estimated Creatinine Clearance: 14.3 mL/min (by C-G formula based on Cr of 5.05).    Recent Labs  01/31/15 1648 01/31/15 2049 02/01/15 0228  GLUCAP 132* 127* 119*    Medications:  Scheduled:  . atorvastatin  40 mg Oral Daily  . ciprofloxacin  400 mg Intravenous Q24H  . insulin aspart  0-9 Units Subcutaneous Q6H  . magnesium sulfate 1 - 4 g bolus IVPB  2 g Intravenous Once  . pantoprazole (PROTONIX) IV  40 mg Intravenous Q24H  . sodium chloride  10-40 mL Intracatheter Q12H  . sulfamethoxazole-trimethoprim  2 tablet Oral Q24H   Infusions:  . amiodarone 30 mg/hr (01/31/15 2216)  . fat emulsion 500 mL (01/31/15 1112)  . TPN (CLINIMIX) Adult without lytes 60 mL/hr at 01/31/15 1845    Insulin Requirements in the past 24 hours:  2 units SSI  Current Nutrition:  Clinimix 5/15 with MVI and trace elements at 60 ml/hr.   Assessment: Magnesium of 1.6.  Potassium 3.3.  Will supplement with Magnesium 2 gm IV once.  Potassium slightly low at  3.3.  Will not order potassium supplementation at this time as potassium may be supplemented with  HD sessions via K bath.     Plan:  Will supplement with Magnesium 2 gm IV once.  Potassium slightly low at  3.3.  Will not order potassium supplementation at this time as potassium may be supplemented with HD sessions via K bath.  Patient has only used 2 units of SSI.  Continue current orders for SSI.   Will order labs in AM.    Murrell Converse, PharmD Clinical Pharmacist 02/01/2015

## 2015-02-01 NOTE — Consult Note (Signed)
Subjective: Patient seen for patient seen for blood-tinged aspirate in the NG tube. Also had an ileus. The patient is doing well. Tolerating clear liquids. The tube has been out for over 24 hours. He had a bowel movement today.  Objective: Vital signs in last 24 hours: Temp:  [97.9 F (36.6 C)-98.6 F (37 C)] 98.5 F (36.9 C) (05/11 1115) Pulse Rate:  [73-76] 73 (05/11 1115) Resp:  [16-18] 18 (05/11 1115) BP: (86-101)/(36-54) 86/54 mmHg (05/11 1115) SpO2:  [97 %-100 %] 100 % (05/11 1115) Weight:  [94.756 kg (208 lb 14.4 oz)] 94.756 kg (208 lb 14.4 oz) (05/11 0532) Blood pressure 86/54, pulse 73, temperature 98.5 F (36.9 C), temperature source Oral, resp. rate 18, height 5' 6.97" (1.701 m), weight 94.756 kg (208 lb 14.4 oz), SpO2 100 %.   Intake/Output from previous day: 05/10 0701 - 05/11 0700 In: -  Out: 1819 [Urine:1450]  Intake/Output this shift: Total I/O In: 1170 [P.O.:920; IV Piggyback:250] Out: 400 [Urine:400]   General appearance:  Elderly appearing male no acute distress. Affect is much brighter today. He has been tolerating his diet well. Resp:  Clear to auscultation Cardio:  Regular rate and rhythm GI:  Soft nontender nondistended bowel sounds positive normoactive Extremities:    Lab Results: Results for orders placed or performed during the hospital encounter of 01/18/15 (from the past 24 hour(s))  Glucose, capillary     Status: Abnormal   Collection Time: 01/31/15  8:49 PM  Result Value Ref Range   Glucose-Capillary 127 (H) 70 - 99 mg/dL   Comment 1 Notify RN   Glucose, capillary     Status: Abnormal   Collection Time: 02/01/15  2:28 AM  Result Value Ref Range   Glucose-Capillary 119 (H) 70 - 99 mg/dL   Comment 1 Notify RN   Basic metabolic panel     Status: Abnormal   Collection Time: 02/01/15  5:15 AM  Result Value Ref Range   Sodium 137 135 - 145 mmol/L   Potassium 3.3 (L) 3.5 - 5.1 mmol/L   Chloride 99 (L) 101 - 111 mmol/L   CO2 29 22 - 32  mmol/L   Glucose, Bld 102 (H) 65 - 99 mg/dL   BUN 30 (H) 6 - 20 mg/dL   Creatinine, Ser 5.05 (H) 0.61 - 1.24 mg/dL   Calcium 7.5 (L) 8.9 - 10.3 mg/dL   GFR calc non Af Amer 10 (L) >60 mL/min   GFR calc Af Amer 12 (L) >60 mL/min   Anion gap 9 5 - 15  Magnesium     Status: Abnormal   Collection Time: 02/01/15  5:15 AM  Result Value Ref Range   Magnesium 1.6 (L) 1.7 - 2.4 mg/dL  Phosphorus     Status: None   Collection Time: 02/01/15  5:15 AM  Result Value Ref Range   Phosphorus 2.5 2.5 - 4.6 mg/dL  Glucose, capillary     Status: Abnormal   Collection Time: 02/01/15  9:33 AM  Result Value Ref Range   Glucose-Capillary 130 (H) 70 - 99 mg/dL   Comment 1 Notify RN   Glucose, capillary     Status: Abnormal   Collection Time: 02/01/15  2:15 PM  Result Value Ref Range   Glucose-Capillary 114 (H) 70 - 99 mg/dL      Recent Labs  01/30/15 0605  WBC 10.6  HGB 7.5*  HCT 22.3*  PLT 208   BMET  Recent Labs  01/30/15 0605 01/31/15 0520 02/01/15 0515  NA 137 137 137  K 3.5 3.6 3.3*  CL 101 103 99*  CO2 27 25 29   GLUCOSE 111* 110* 102*  BUN 28* 39* 30*  CREATININE 6.29* 7.03* 5.05*  CALCIUM 7.4* 7.7* 7.5*   LFT No results for input(s): PROT, ALBUMIN, AST, ALT, ALKPHOS, BILITOT, BILIDIR, IBILI in the last 72 hours. PT/INR No results for input(s): LABPROT, INR in the last 72 hours. Hepatitis Panel No results for input(s): HEPBSAG, HCVAB, HEPAIGM, HEPBIGM in the last 72 hours. C-Diff No results for input(s): CDIFFTOX in the last 72 hours. No results for input(s): CDIFFPCR in the last 72 hours.   Studies/Results: Dg Abd 2 Views  Feb 11, 2015   CLINICAL DATA:  Mild small bowel ileus  EXAM: ABDOMEN - 2 VIEW  COMPARISON:  01/30/2015  FINDINGS: Nasogastric catheter remains in place. Bilateral ureteral stents as well as an aortic stent graft are noted. No free intraperitoneal air is seen. Scattered large and small bowel gas is noted. A few mildly dilated loops of small bowel are  noted although the overall appearance has improved slightly in the interval from the prior exam.  IMPRESSION: Slight improvement in the degree of small bowel dilatation.   Electronically Signed   By: Inez Catalina M.D.   On: 2015-02-11 08:19    Scheduled Inpatient Medications:   . atorvastatin  40 mg Oral Daily  . ciprofloxacin  400 mg Intravenous Q24H  . insulin aspart  0-9 Units Subcutaneous Q6H  . pantoprazole (PROTONIX) IV  40 mg Intravenous Q24H  . sodium chloride  10-40 mL Intracatheter Q12H  . sulfamethoxazole-trimethoprim  2 tablet Oral Q24H    Continuous Inpatient Infusions:   . amiodarone 30 mg/hr (02/01/15 1252)  . TPN (CLINIMIX) Adult without lytes      PRN Inpatient Medications:  acetaminophen, diphenhydrAMINE, ondansetron (ZOFRAN) IV, phenol, sodium chloride, sodium chloride  Miscellaneous:   Assessment:  1 ileus resolved 2 no evidence of ongoing GI bleed  Plan:  1 advance diet to liquids. I'll sign off reconsult as needed.  Lollie Sails MD 02/01/2015, 6:17 PM

## 2015-02-01 NOTE — Progress Notes (Signed)
Subjective:   Getting TPN.   No SOB Overall feels better today  Objective:  Vital signs in last 24 hours:  Temp:  [97.9 F (36.6 C)-98.6 F (37 C)] 98.5 F (36.9 C) (05/11 1115) Pulse Rate:  [73-84] 73 (05/11 1115) Resp:  [16-22] 18 (05/11 1115) BP: (86-104)/(36-57) 86/54 mmHg (05/11 1115) SpO2:  [97 %-100 %] 100 % (05/11 1115) Weight:  [94.756 kg (208 lb 14.4 oz)-96.934 kg (213 lb 11.2 oz)] 94.756 kg (208 lb 14.4 oz) (05/11 0532)  Weight change: 0.136 kg (4.8 oz) Filed Weights   01/31/15 0517 01/31/15 1650 02/01/15 0532  Weight: 96.798 kg (213 lb 6.4 oz) 96.934 kg (213 lb 11.2 oz) 94.756 kg (208 lb 14.4 oz)    Intake/Output: I/O last 3 completed shifts: In: 30 [I.V.:30] Out: 2669 [Urine:2300; Other:369]   Intake/Output this shift:  Total I/O In: 570 [P.O.:520; IV Piggyback:50] Out: 400 [Urine:400]  General: not in acute distress Neck: supple, RIJ central line CVS: irregular rate and rhythm Resp: clear  ABD: +soft, +bowel sounds EXT: no edema Neuro: alert and oriented Access: Left arm AVF   Basic Metabolic Panel:  Recent Labs Lab 01/28/15 0454 01/29/15 0633 01/30/15 0605 01/31/15 0520 02/01/15 0515  NA 139 139 137 137 137  K 2.8* 3.6 3.5 3.6 3.3*  CL 98* 101 101 103 99*  CO2 31 30 27 25 29   GLUCOSE 118* 115* 111* 110* 102*  BUN 34* 20 28* 39* 30*  CREATININE 6.72* 4.64* 6.29* 7.03* 5.05*  CALCIUM 7.5* 7.5* 7.4* 7.7* 7.5*  MG 1.8 1.7 1.5* 1.7 1.6*  PHOS 4.2 2.1* 3.3 3.6 2.5    Liver Function Tests:  Recent Labs Lab 01/26/15 0510  ALBUMIN 2.1*   No results for input(s): LIPASE, AMYLASE in the last 168 hours. No results for input(s): AMMONIA in the last 168 hours.  CBC:  Recent Labs Lab 01/26/15 0510 01/26/15 1745 01/27/15 0500 01/28/15 0454 01/29/15 0633 01/30/15 0605  WBC 8.9  --  11.3* 12.6* 10.9* 10.6  HGB 6.3* 7.5* 6.9* 7.7* 7.5* 7.5*  HCT 19.4* 23.3* 21.6* 23.1* 23.3* 22.3*  MCV 89.6  --  88.8 87.8 88.6 87.9  PLT 258  --   259 258 236 208    Cardiac Enzymes: No results for input(s): CKTOTAL, CKMB, CKMBINDEX, TROPONINI in the last 168 hours.  BNP: Invalid input(s): POCBNP  CBG:  Recent Labs Lab 01/31/15 1648 01/31/15 2049 02/01/15 0228 02/01/15 0933 02/01/15 1415  GLUCAP 132* 127* 119* 130* 114*    Microbiology: Results for orders placed or performed during the hospital encounter of 01/18/15  Culture, blood (single)     Status: None (Preliminary result)   Collection Time: 01/18/15  7:11 PM  Result Value Ref Range Status   Micro Text Report   Preliminary       COMMENT                   NO GROWTH IN 2 HOURS   ANTIBIOTIC                                                      Culture, blood (single)     Status: None (Preliminary result)   Collection Time: 01/18/15  7:11 PM  Result Value Ref Range Status   Micro Text Report   Preliminary  COMMENT                   NO GROWTH IN 48 HOURS   ANTIBIOTIC                                                      Urine culture     Status: None   Collection Time: 01/18/15  7:36 PM  Result Value Ref Range Status   Micro Text Report   Final       SOURCE: INDWELLING CATHETER    COMMENT                   NO GROWTH IN 36 HOURS   ANTIBIOTIC                                                      Urine culture     Status: None   Collection Time: 01/20/15  4:09 PM  Result Value Ref Range Status   Micro Text Report   Final       SOURCE: INDWELLING CATH    ORGANISM 1                2000 CFU/ML ENTEROBACTER SPECIES   COMMENT                   UNABLE TO FURTHER SPECIATE   COMMENT                   -   ANTIBIOTIC                    ORG#1     CEFAZOLIN                     R         CEFOXITIN                     R         CEFTRIAXONE                   R         CIPROFLOXACIN                 I         ERTAPENEM                     S         GENTAMICIN                    S         IMIPENEM                      S         LEVOFLOXACIN                   I         NITROFURANTOIN                I         TRIMETH/SULFA  S             Coagulation Studies: No results for input(s): LABPROT, INR in the last 72 hours.  Urinalysis: No results for input(s): COLORURINE, LABSPEC, PHURINE, GLUCOSEU, HGBUR, BILIRUBINUR, KETONESUR, PROTEINUR, UROBILINOGEN, NITRITE, LEUKOCYTESUR in the last 72 hours.  Invalid input(s): APPERANCEUR    Imaging: Dg Abd 2 Views  01/31/2015   CLINICAL DATA:  Mild small bowel ileus  EXAM: ABDOMEN - 2 VIEW  COMPARISON:  01/30/2015  FINDINGS: Nasogastric catheter remains in place. Bilateral ureteral stents as well as an aortic stent graft are noted. No free intraperitoneal air is seen. Scattered large and small bowel gas is noted. A few mildly dilated loops of small bowel are noted although the overall appearance has improved slightly in the interval from the prior exam.  IMPRESSION: Slight improvement in the degree of small bowel dilatation.   Electronically Signed   By: Inez Catalina M.D.   On: 01/31/2015 08:19     Medications:   . amiodarone 30 mg/hr (02/01/15 1252)  . TPN (CLINIMIX) Adult without lytes 60 mL/hr at 01/31/15 1845  . TPN (CLINIMIX) Adult without lytes     . atorvastatin  40 mg Oral Daily  . ciprofloxacin  400 mg Intravenous Q24H  . insulin aspart  0-9 Units Subcutaneous Q6H  . pantoprazole (PROTONIX) IV  40 mg Intravenous Q24H  . sodium chloride  10-40 mL Intracatheter Q12H  . sulfamethoxazole-trimethoprim  2 tablet Oral Q24H   acetaminophen, diphenhydrAMINE, ondansetron (ZOFRAN) IV, phenol, sodium chloride, sodium chloride  Assessment/ Plan:  74 y.o. 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 Hemodialysis  - Continue TTS schedule.   - next HD tomorrow- orders placed   2. Anemia of chronic kidney disease: D63.1  - holding epo due to prostate cancer.  - transfusion on 5/5 and 5/6   3. Secondary Hyperparathyroidism: N25.81  - Currently on TPN, will monitor phos level.   4. Ileus: K56.7 NGT to suction. Continue supportive care. Appreciate surgery input. - placed on TPN,lipids added  - tolerating clears today  5. A. Fib with RVR: I48.91  - followed by cardiology    LOS: [place] 5/11/20163:44 PM

## 2015-02-01 NOTE — Progress Notes (Signed)
MEDICATION RELATED CONSULT NOTE - FOLLOW UP   Pharmacy Consult for Renal dosing in HD Patient Indication: Medication adjustments in HD patient  Allergies  Allergen Reactions  . Penicillins Hives    Patient Measurements: Height: 5' 6.97" (170.1 cm) Weight: 208 lb 14.4 oz (94.756 kg) IBW/kg (Calculated) : 66.03   Labs:  Recent Labs  01/30/15 0605 01/31/15 0520 02/01/15 0515  WBC 10.6  --   --   HGB 7.5*  --   --   HCT 22.3*  --   --   PLT 208  --   --   CREATININE 6.29* 7.03* 5.05*  MG 1.5* 1.7 1.6*  PHOS 3.3 3.6 2.5   Estimated Creatinine Clearance: 14.3 mL/min (by C-G formula based on Cr of 5.05).     Medications:  Prescriptions prior to admission  Medication Sig Dispense Refill Last Dose  . amLODipine (NORVASC) 5 MG tablet Take 5 mg by mouth at bedtime.     . budesonide-formoterol (SYMBICORT) 160-4.5 MCG/ACT inhaler Inhale 2 puffs into the lungs 2 (two) times daily.     . chlorpheniramine (CHLOR-TRIMETON) 4 MG tablet Take 4 mg by mouth every 4 (four) hours as needed for allergies.     Marland Kitchen loperamide (IMODIUM) 2 MG capsule Take 2 mg by mouth every 4 (four) hours as needed for diarrhea or loose stools.     . magnesium oxide (MAG-OX) 400 MG tablet Take 400 mg by mouth at bedtime.     . NONFORMULARY OR COMPOUNDED ITEM Take 1 tablet by mouth daily. Rena-Vite Vitamin B Complex with C and Folic Acid     . sevelamer carbonate (RENVELA) 800 MG tablet Take 800 mg by mouth 3 (three) times daily with meals.     . Tiotropium Bromide Monohydrate 2.5 MCG/ACT AERS Inhale 1 puff into the lungs daily.      Scheduled:  . atorvastatin  40 mg Oral Daily  . ciprofloxacin  400 mg Intravenous Q24H  . insulin aspart  0-9 Units Subcutaneous Q6H  . magnesium sulfate 1 - 4 g bolus IVPB  2 g Intravenous Once  . pantoprazole (PROTONIX) IV  40 mg Intravenous Q24H  . sodium chloride  10-40 mL Intracatheter Q12H  . sulfamethoxazole-trimethoprim  2 tablet Oral Q24H    Assessment:  Patient  with ESRD requiring HD on Tues, Thurs, Sat schedule.  Goal of Therapy:  Adjustment of medications for renal function.  Plan:  No adjustments warranted at this time. Pharmacy will continue to follow.  Murrell Converse, PharmD Clinical Pharmacist 02/01/2015

## 2015-02-01 NOTE — Progress Notes (Signed)
PT Cancellation Note  Patient Details Name: [name] MRN: [id number] DOB: [birth year]   Cancelled Treatment:    Reason Eval/Treat Not Completed: Fatigue/lethargy limiting ability to participate   Ramond Dial 02/01/2015, 12:51 PM   Mee Hives, PT MS Acute Rehab Dept. Number: [facility] [id number] and [place] [phone]

## 2015-02-01 NOTE — Progress Notes (Signed)
Patient resting in bed with tpn infusing , denies pain no distress noted at this time , still on iv abx , care ongoing

## 2015-02-01 NOTE — Progress Notes (Signed)
OT Cancellation Note  Patient Details Name: [name] MRN: [id number] DOB: [birth year]   Cancelled Treatment:     Pt limited due to fatigue and declined OT today.  Wofford,Susan 02/01/2015, 2:07 PM

## 2015-02-01 NOTE — Progress Notes (Signed)
[place] [facility] [place] NAME: [name]    MR#:  [id number]  DATE OF BIRTH:  30-Nov-1940  SUBJECTIVE: Feels some what better. No fever.  CHIEF COMPLAINT:  No new complains today. Tolerating liquid diet today. REVIEW OF SYSTEMS:  CONSTITUTIONAL: No fever, fatigue or weakness.  EYES: No blurred or double vision.  EARS, NOSE, AND THROAT: No tinnitus or ear pain.  RESPIRATORY: No cough, shortness of breath, wheezing or hemoptysis.  CARDIOVASCULAR: No chest pain, orthopnea, edema.  GASTROINTESTINAL: No nausea, vomiting, diarrhea or abdominal pain.  GENITOURINARY: No dysuria, hematuria.  ENDOCRINE: No polyuria, nocturia,  HEMATOLOGY: No anemia, easy bruising or bleeding SKIN: No rash or lesion. MUSCULOSKELETAL: No joint pain or arthritis.   NEUROLOGIC: No tingling, numbness, weakness.  PSYCHIATRY: No anxiety or depression.   DRUG ALLERGIES:   Allergies  Allergen Reactions  . Penicillins Hives    VITALS:  Blood pressure 86/54, pulse 73, temperature 98.5 F (36.9 C), temperature source Oral, resp. rate 18, height 5' 6.97" (1.701 m), weight 94.756 kg (208 lb 14.4 oz), SpO2 100 %.  PHYSICAL EXAMINATION:  GENERAL:  74 y.o.-year-old patient lying in the bed with no acute distress.  EYES: Pupils equal, round, reactive to light and accommodation. No scleral icterus. Extraocular muscles intact.  HEENT: Head atraumatic, normocephalic. Oropharynx and nasopharynx clear. NGT removed. Right side IJ line present. NECK:  Supple, no jugular venous distention. No thyroid enlargement, no tenderness.  LUNGS: Normal breath sounds bilaterally, no wheezing, rales,rhonchi or crepitation. No use of accessory muscles of respiration.  CARDIOVASCULAR: S1, S2 normal. No murmurs, rubs, or gallops.  ABDOMEN: Soft, nontender, nondistended. Sluggish bowel sounds present. No organomegaly or mass.  EXTREMITIES: No pedal edema, cyanosis, or clubbing.   NEUROLOGIC: Cranial nerves II through XII are intact. Muscle strength 5/5 in all extremities. Sensation intact. Gait not checked.  PSYCHIATRIC: The patient is alert and oriented x 3.  SKIN: No obvious rash, lesion, or ulcer.    LABORATORY PANEL:   CBC  Recent Labs Lab 01/30/15 0605  WBC 10.6  HGB 7.5*  HCT 22.3*  PLT 208   ------------------------------------------------------------------------------------------------------------------  Chemistries   Recent Labs Lab 02/01/15 0515  NA 137  K 3.3*  CL 99*  CO2 29  GLUCOSE 102*  BUN 30*  CREATININE 5.05*  CALCIUM 7.5*  MG 1.6*   ------------------------------------------------------------------------------------------------------------------  ---------------------------------------------------------------------------------------------------------------   ASSESSMENT AND PLAN:  38 m with Bladder/prostate cancer, ESRD on HD, hypertension here with confusion, SOB  * New onset Afib with RVR: due to sepsis, now back in NSR on amio drip. - Due to Ileus- has to stay for long time on IV amiodarone- now cardio can switch to oral. - Holding anticoagulation for now given anemia - No diltiazem given low EF per cardio  * ileus: kept NPO , NGT suction since 01/22/15. NGT removed 01/31/15, on clear liquids.   Appreciate surgical consult. conservative mgmt for now. As no oral intake for 6 days- Dietary involved to start TPN 01/27/15.  Xray shows significant improvement, and had a BM   will upgrade diet per sx and GI.  * Septic shock due to UTI  Had ureteral stents- seen by ID, will c/s urology to see if stents will need to be replaced per ID.  BP stable.   on bactrim - can stop on 5/16 per ID.  repeated cx just 2000CFU gr neg rods.  Has ureteral stents and Hx of prostate cancer- may need stent removal  and to continue foley - per urology, advised to follow in office.  * GI bleed  Have Occult blood positive in Gastric  secretions, and required 2 Units of blood transfusion on last 3 days.  Appreciate GI consult.  * ESRD on HD  manage per nephrology  * Anemia of Chronic Disease with worsening due to GI loss  Transfused 1 unit PRBC 01/26/15 and 01/26/14.  Target Hb should be around 8 per cardiology.   * Pericardial effusion  Cardio on case- moderate Effusion per echo, but no temponade. EF is 45%  * Acute resp failure on admission  Aspiration?  Monitor.  Now on room air.  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 EF 45-50% on echo this admission.    He will need cardiac cath once stable, including anemia work up which is ongoing.    Lipitor 40 mg daily   can add low dose beta blocker (Coreg) if BP remains stable, maybe tomorrow.   Volume controlled by HD.    Overall prognosis is very poor due to multiple medical issues Spoke to Pt and his POA- [name] ( cousin)- they agreed to meet palliative care team to help decide.   All the records are reviewed and case discussed with nurse. Management plans discussed with the patient, family and they are in agreement.  CODE STATUS: Full Code  TOTAL TIME TAKING CARE OF THIS PATIENT: 35 minutes.   POSSIBLE D/C IN 3-4 DAYS, DEPENDING ON CLINICAL CONDITION.   Slow improvement in condition, will need rehab.   Vaughan Basta M.D on 02/01/2015 at 11:43 AM  Between 7am to 6pm - Pager - [phone]  After 6pm go to [email or web address] - password [email or web address] [name]  Office  [phone]  CC: Primary care physician; Dion Body, MD

## 2015-02-02 ENCOUNTER — Inpatient Hospital Stay: Payer: Medicare Other

## 2015-02-02 LAB — RENAL FUNCTION PANEL
ALBUMIN: 1.9 g/dL — AB (ref 3.5–5.0)
Anion gap: 9 (ref 5–15)
BUN: 49 mg/dL — ABNORMAL HIGH (ref 6–20)
CALCIUM: 7.6 mg/dL — AB (ref 8.9–10.3)
CO2: 23 mmol/L (ref 22–32)
Chloride: 100 mmol/L — ABNORMAL LOW (ref 101–111)
Creatinine, Ser: 6.51 mg/dL — ABNORMAL HIGH (ref 0.61–1.24)
GFR calc Af Amer: 9 mL/min — ABNORMAL LOW (ref >60)
GFR, EST NON AFRICAN AMERICAN: 8 mL/min — AB (ref >60)
Glucose, Bld: 180 mg/dL — ABNORMAL HIGH (ref 65–99)
Phosphorus: 2.6 mg/dL (ref 2.5–4.6)
Potassium: 3.3 mmol/L — ABNORMAL LOW (ref 3.5–5.1)
Sodium: 132 mmol/L — ABNORMAL LOW (ref 135–145)

## 2015-02-02 LAB — GLUCOSE, CAPILLARY
GLUCOSE-CAPILLARY: 112 mg/dL — AB (ref 65–99)
Glucose-Capillary: 119 mg/dL — ABNORMAL HIGH (ref 65–99)
Glucose-Capillary: 126 mg/dL — ABNORMAL HIGH (ref 65–99)
Glucose-Capillary: 118 mg/dL — ABNORMAL HIGH (ref 65–99)

## 2015-02-02 LAB — CBC
HEMATOCRIT: 21.1 % — AB (ref 40.0–52.0)
Hemoglobin: 7 g/dL — ABNORMAL LOW (ref 13.0–18.0)
MCH: 29.1 pg (ref 26.0–34.0)
MCHC: 33.2 g/dL (ref 32.0–36.0)
MCV: 87.7 fL (ref 80.0–100.0)
PLATELETS: 218 10*3/uL (ref 150–440)
RBC: 2.41 MIL/uL — ABNORMAL LOW (ref 4.40–5.90)
RDW: 18 % — AB (ref 11.5–14.5)
WBC: 6.9 10*3/uL (ref 3.8–10.6)

## 2015-02-02 LAB — MAGNESIUM: MAGNESIUM: 1.8 mg/dL (ref 1.7–2.4)

## 2015-02-02 MED ORDER — TRACE MINERALS CR-CU-MN-SE-ZN 10-1000-500-60 MCG/ML IV SOLN
INTRAVENOUS | Status: AC
  Administered 2015-02-02: 19:00:00 via INTRAVENOUS
  Filled 2015-02-02: qty 2000

## 2015-02-02 MED ORDER — TRACE MINERALS CR-CU-MN-SE-ZN 10-1000-500-60 MCG/ML IV SOLN
INTRAVENOUS | Status: DC
  Filled 2015-02-02: qty 1440

## 2015-02-02 NOTE — Progress Notes (Signed)
Patient has no acute event overnight. He received Benadryl for insomnia.He later rested well w/o any distress. He is now on full liquid with TPN infusing @60mL /hr.Patient is NSR on the monitor with stable VS and amio infusing at 0.5mg /hr. Patient is repositioned Q2H and as needed.

## 2015-02-02 NOTE — Progress Notes (Signed)
HD tx completed.

## 2015-02-02 NOTE — Care Management Note (Signed)
Patient has a active out patient dialysis placement.  [facility] [address] MWF

## 2015-02-02 NOTE — Progress Notes (Signed)
Subjective:   Getting TPN.   No SOB Patient seen during dialysis Tolerating well  BP low normal  Objective:  Vital signs in last 24 hours:  Temp:  [97.4 F (36.3 C)-98.6 F (37 C)] 98.6 F (37 C) (05/12 1000) Pulse Rate:  [69-76] 75 (05/12 1200) Resp:  [18-22] 22 (05/12 1200) BP: (92-104)/(50-63) 97/58 mmHg (05/12 1200) SpO2:  [97 %-100 %] 100 % (05/12 1200) Weight:  [95.8 kg (211 lb 3.2 oz)-96.117 kg (211 lb 14.4 oz)] 95.8 kg (211 lb 3.2 oz) (05/12 0945)  Weight change: -0.816 kg (-1 lb 12.8 oz) Filed Weights   02/01/15 0532 02/02/15 0422 02/02/15 0945  Weight: 94.756 kg (208 lb 14.4 oz) 96.117 kg (211 lb 14.4 oz) 95.8 kg (211 lb 3.2 oz)    Intake/Output: I/O last 3 completed shifts: In: 2810.4 [P.O.:920; I.V.:200.4; IV Piggyback:250] Out: 3151 [Urine:2450]   Intake/Output this shift:  Total I/O In: -  Out: 600 [Urine:600]  General: not in acute distress Neck: supple, RIJ central line CVS: irregular rate and rhythm Resp: clear  ABD: +soft,  EXT: no edema Neuro: alert and oriented Access: Left arm AVF   Basic Metabolic Panel:  Recent Labs Lab 01/29/15 0633 01/30/15 0605 01/31/15 0520 02/01/15 0515 02/02/15 1000  NA 139 137 137 137 132*  K 3.6 3.5 3.6 3.3* 3.3*  CL 101 101 103 99* 100*  CO2 30 27 25 29 23   GLUCOSE 115* 111* 110* 102* 180*  BUN 20 28* 39* 30* 49*  CREATININE 4.64* 6.29* 7.03* 5.05* 6.51*  CALCIUM 7.5* 7.4* 7.7* 7.5* 7.6*  MG 1.7 1.5* 1.7 1.6* 1.8  PHOS 2.1* 3.3 3.6 2.5 2.6    Liver Function Tests:  Recent Labs Lab 02/02/15 1000  ALBUMIN 1.9*   No results for input(s): LIPASE, AMYLASE in the last 168 hours. No results for input(s): AMMONIA in the last 168 hours.  CBC:  Recent Labs Lab 01/27/15 0500 01/28/15 0454 01/29/15 7616 01/30/15 0605 02/02/15 1000  WBC 11.3* 12.6* 10.9* 10.6 6.9  HGB 6.9* 7.7* 7.5* 7.5* 7.0*  HCT 21.6* 23.1* 23.3* 22.3* 21.1*  MCV 88.8 87.8 88.6 87.9 87.7  PLT 259 258 236 208 218     Cardiac Enzymes: No results for input(s): CKTOTAL, CKMB, CKMBINDEX, TROPONINI in the last 168 hours.  BNP: Invalid input(s): POCBNP  CBG:  Recent Labs Lab 02/01/15 0228 02/01/15 0933 02/01/15 1415 02/01/15 2115 02/02/15 0305  GLUCAP 119* 130* 114* 56 119*    Microbiology: Results for orders placed or performed during the hospital encounter of 01/18/15  Culture, blood (single)     Status: None (Preliminary result)   Collection Time: 01/18/15  7:11 PM  Result Value Ref Range Status   Micro Text Report   Preliminary       COMMENT                   NO GROWTH IN 69 HOURS   ANTIBIOTIC                                                      Culture, blood (single)     Status: None (Preliminary result)   Collection Time: 01/18/15  7:11 PM  Result Value Ref Range Status   Micro Text Report   Preliminary       COMMENT  NO GROWTH IN 48 HOURS   ANTIBIOTIC                                                      Urine culture     Status: None   Collection Time: 01/18/15  7:36 PM  Result Value Ref Range Status   Micro Text Report   Final       SOURCE: INDWELLING CATHETER    COMMENT                   NO GROWTH IN 36 HOURS   ANTIBIOTIC                                                      Urine culture     Status: None   Collection Time: 01/20/15  4:09 PM  Result Value Ref Range Status   Micro Text Report   Final       SOURCE: INDWELLING CATH    ORGANISM 1                2000 CFU/ML ENTEROBACTER SPECIES   COMMENT                   UNABLE TO FURTHER SPECIATE   COMMENT                   -   ANTIBIOTIC                    ORG#1     CEFAZOLIN                     R         CEFOXITIN                     R         CEFTRIAXONE                   R         CIPROFLOXACIN                 I         ERTAPENEM                     S         GENTAMICIN                    S         IMIPENEM                      S         LEVOFLOXACIN                  I          NITROFURANTOIN                I         TRIMETH/SULFA                 S  Coagulation Studies: No results for input(s): LABPROT, INR in the last 72 hours.  Urinalysis: No results for input(s): COLORURINE, LABSPEC, PHURINE, GLUCOSEU, HGBUR, BILIRUBINUR, KETONESUR, PROTEINUR, UROBILINOGEN, NITRITE, LEUKOCYTESUR in the last 72 hours.  Invalid input(s): APPERANCEUR    Imaging: Dg Abd 1 View  02/02/2015   CLINICAL DATA:  Ileus, followup  EXAM: ABDOMEN - 1 VIEW  COMPARISON:  Abdomen films of 01/31/2015  FINDINGS: There is both large and small bowel gas present without significant distention noted. Bilateral double-J ureteral stents remain. Iliac artery stents also are noted as well as coil embolization of the internal iliac arteries bilaterally. There is some opacity at the left lung base not well evaluated on this abdomen film.  IMPRESSION: No bowel obstruction or significant ileus.   Electronically Signed   By: Ivar Drape M.D.   On: 02/02/2015 08:17     Medications:   . amiodarone 30 mg/hr (02/02/15 0153)  . TPN (CLINIMIX) Adult without lytes 60 mL/hr at 02/01/15 2133  . TPN (CLINIMIX) Adult without lytes     . atorvastatin  40 mg Oral Daily  . ciprofloxacin  400 mg Intravenous Q24H  . insulin aspart  0-9 Units Subcutaneous Q6H  . pantoprazole (PROTONIX) IV  40 mg Intravenous Q24H  . sodium chloride  10-40 mL Intracatheter Q12H  . sulfamethoxazole-trimethoprim  2 tablet Oral Q24H   acetaminophen, diphenhydrAMINE, ondansetron (ZOFRAN) IV, phenol, sodium chloride, sodium chloride  Assessment/ Plan:  74 y.o. 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 Hemodialysis  - Continue TTS schedule.   - Patient seen during dialysis - Tolerating well    2. Anemia of chronic kidney disease: D63.1  - holding epo due to prostate cancer.  - transfusion on 5/5 and 5/6   3. Secondary Hyperparathyroidism: N25.81  - Currently on TPN, will monitor phos level.   4. Ileus: K56.7 Continue supportive care. Appreciate surgery input. - placed on TPN,lipids added  - tolerating clears  5. A. Fib with RVR: I48.91  - followed by cardiology    LOS: [place] 5/12/20161:43 PM

## 2015-02-02 NOTE — Progress Notes (Signed)
Post hd tx 

## 2015-02-02 NOTE — Progress Notes (Signed)
PT Cancellation Note  Patient Details Name: [name] MRN: [id number] DOB: [birth year]   Cancelled Treatment:    Reason Eval/Treat Not Completed: Patient at procedure or test/unavailable   Ramond Dial 02/02/2015, 3:16 PM   Mee Hives, PT MS Acute Rehab Dept. Number: [facility] [id number] and [place] [phone]

## 2015-02-02 NOTE — Progress Notes (Signed)
PARENTERAL NUTRITION CONSULT NOTE - FOLLOW UP  Pharmacy Consult for TPN Electrolyte/Glucose MGMT  Allergies  Allergen Reactions  . Penicillins Hives    Patient Measurements: Height: 5' 6.97" (170.1 cm) Weight: 211 lb 3.2 oz (95.8 kg) IBW/kg (Calculated) : 66.03  Vital Signs: Temp: 98.6 F (37 C) (05/12 1000) Temp Source: Oral (05/12 1000) BP: 97/58 mmHg (05/12 1200) Pulse Rate: 75 (05/12 1200) Intake/Output from previous day: 05/11 0701 - 05/12 0700 In: 2810.4 [P.O.:920; I.V.:200.4; IV Piggyback:250; JQZ:0092] Out: 1700 [Urine:1700] Intake/Output from this shift: Total I/O In: -  Out: 600 [Urine:600]  Labs:  Recent Labs  02/02/15 1000  WBC 6.9  HGB 7.0*  HCT 21.1*  PLT 218     Recent Labs  01/31/15 0520 02/01/15 0515 02/02/15 1000  NA 137 137 132*  K 3.6 3.3* 3.3*  CL 103 99* 100*  CO2 25 29 23   GLUCOSE 110* 102* 180*  BUN 39* 30* 49*  CREATININE 7.03* 5.05* 6.51*  CALCIUM 7.7* 7.5* 7.6*  MG 1.7 1.6* 1.8  PHOS 3.6 2.5 2.6  ALBUMIN  --   --  1.9*   Estimated Creatinine Clearance: 11.1 mL/min (by C-G formula based on Cr of 6.51).    Recent Labs  02/01/15 1415 02/01/15 2115 02/02/15 0305  GLUCAP 114* 91 119*    Medications:  Scheduled:  . atorvastatin  40 mg Oral Daily  . ciprofloxacin  400 mg Intravenous Q24H  . insulin aspart  0-9 Units Subcutaneous Q6H  . pantoprazole (PROTONIX) IV  40 mg Intravenous Q24H  . sodium chloride  10-40 mL Intracatheter Q12H  . sulfamethoxazole-trimethoprim  2 tablet Oral Q24H   Infusions:  . amiodarone 30 mg/hr (02/02/15 0153)  . TPN (CLINIMIX) Adult without lytes 60 mL/hr at 02/01/15 2133  . TPN (CLINIMIX) Adult without lytes      Insulin Requirements in the past 24 hours:  1 unit SSI  Current Nutrition:  Clinimix 5/15 with MVI and trace elements at 60 ml/hr.   Assessment:  Potassium 3.3.  Will not order potassium supplementation at this time as potassium may be supplemented with HD sessions via  K bath.   All other labs within acceptable range.  Patient has used 1 units of SSI in past 24 hours.  Plan:  No electrolyte supplementation warranted at this time.  Continue current SSI orders.   Will order labs in AM.    Pharmacy will continue to follow.  Murrell Converse, PharmD Clinical Pharmacist 02/02/2015

## 2015-02-02 NOTE — Progress Notes (Signed)
Nutrition Follow-up  DOCUMENTATION CODES:  INTERVENTION: PN: Continue TPN of 5% AA/ 15% Dextrose without electrolytes at 97m/ hr to provide 1165 kcal, 72g Pro, and 1940 ml fluid over next 24 hrs. When patient meeting 60% of estimated energy needs via PO diet, will recommend d/c TPN- goal not yet met Meals and Snacks: Cater to patient preferences as able  NUTRITION DIAGNOSIS:  Inadequate oral intake related to altered GI function continues but being addressed with TPN and full liquid diet.  GOAL:  Provide needs based on ASPEN/SCCM guidelines, Patient will meet greater than or equal to 90% of their needs- goal being met  MONITOR:  Diet advancement, Labs, Weight trends, I & O's  REASON FOR ASSESSMENT:  Other (Comment) (RD Follow Up) Assessment of nutrition requirement/status (TPN Follow/ Diet Progression)  ASSESSMENT: Clinical Update: Pt advanced to [place] diet, per MD- consult to GI for blood in secretions, small bowel dilatation improving slowly, NGT out, currently at HD Typical Food/ Fluid Intake: 50% of full liquid diet noted per I/O yesterday AM Weight Changes: Reviewed Labs: Protein Profile:  Recent Labs Lab 02/02/15 1000  ALBUMIN 1.9*   Electrolyte and Renal Profile:    Recent Labs Lab 01/31/15 0520 02/01/15 0515 02/02/15 1000  BUN 39* 30* 49*  CREATININE 7.03* 5.05* 6.51*  NA 137 137 132*  K 3.6 3.3* 3.3*  MG 1.7 1.6* 1.8  PHOS 3.6 2.5 2.6    Meds: Lipitor,  Physical Findings: n/a UOP: 1700 ml/ 24 hrs  Height:  Ht Readings from Last 1 Encounters:  01/20/15 5' 6.97" (1.701 m)    Weight:  Wt Readings from Last 1 Encounters:  02/02/15 211 lb 3.2 oz (95.8 kg)    Ideal Body Weight:    Wt Readings from Last 10 Encounters:  02/02/15 211 lb 3.2 oz (95.8 kg)    BMI:  Body mass index is 33.11 kg/(m^2).  Estimated Nutritional Needs:  Kcal:  19892-1194kcal/ day (BEE: 1373 x 1.2 AF x 1.0-1.2 IF)- using IBW of 67 kg  Protein:  80-101 g Pro/day  (1.2-1.5 g Pro/ kg/ day)- using IBW of 67 kg  Fluid:  10046m+ UOP  Skin:  Wound (see comment) (Stage II Sacrum)  Diet Order:  TPN (CLINIMIX) Adult without lytes Diet full liquid Room service appropriate?: Yes; Fluid consistency:: Thin  EDUCATION NEEDS:  No education needs identified at this time   Intake/Output Summary (Last 24 hours) at 02/02/15 1245 Last data filed at 02/02/15 0922  Gross per 24 hour  Intake 1840.4 ml  Output   1900 ml  Net  -59.6 ml    Last BM:  5/12, small  TrRoda [email or web address] Pager: [phone]: [email or web address]

## 2015-02-02 NOTE — Progress Notes (Signed)
MEDICATION RELATED CONSULT NOTE - FOLLOW UP   Pharmacy Consult for Renal dosing in HD Patient Indication: Medication adjustments in HD patient  Allergies  Allergen Reactions  . Penicillins Hives    Patient Measurements: Height: 5' 6.97" (170.1 cm) Weight: 211 lb 3.2 oz (95.8 kg) IBW/kg (Calculated) : 66.03   Labs:  Recent Labs  01/31/15 0520 02/01/15 0515 02/02/15 1000  WBC  --   --  6.9  HGB  --   --  7.0*  HCT  --   --  21.1*  PLT  --   --  218  CREATININE 7.03* 5.05* 6.51*  MG 1.7 1.6* 1.8  PHOS 3.6 2.5 2.6  ALBUMIN  --   --  1.9*   Estimated Creatinine Clearance: 11.1 mL/min (by C-G formula based on Cr of 6.51).     Medications:  Prescriptions prior to admission  Medication Sig Dispense Refill Last Dose  . amLODipine (NORVASC) 5 MG tablet Take 5 mg by mouth at bedtime.     . budesonide-formoterol (SYMBICORT) 160-4.5 MCG/ACT inhaler Inhale 2 puffs into the lungs 2 (two) times daily.     . chlorpheniramine (CHLOR-TRIMETON) 4 MG tablet Take 4 mg by mouth every 4 (four) hours as needed for allergies.     Marland Kitchen loperamide (IMODIUM) 2 MG capsule Take 2 mg by mouth every 4 (four) hours as needed for diarrhea or loose stools.     . magnesium oxide (MAG-OX) 400 MG tablet Take 400 mg by mouth at bedtime.     . NONFORMULARY OR COMPOUNDED ITEM Take 1 tablet by mouth daily. Rena-Vite Vitamin B Complex with C and Folic Acid     . sevelamer carbonate (RENVELA) 800 MG tablet Take 800 mg by mouth 3 (three) times daily with meals.     . Tiotropium Bromide Monohydrate 2.5 MCG/ACT AERS Inhale 1 puff into the lungs daily.      Scheduled:  . atorvastatin  40 mg Oral Daily  . ciprofloxacin  400 mg Intravenous Q24H  . insulin aspart  0-9 Units Subcutaneous Q6H  . pantoprazole (PROTONIX) IV  40 mg Intravenous Q24H  . sodium chloride  10-40 mL Intracatheter Q12H  . sulfamethoxazole-trimethoprim  2 tablet Oral Q24H    Assessment:  Patient with ESRD requiring HD on Tues, Thurs, Sat  schedule.  Goal of Therapy:  Adjustment of medications for renal function.  Plan:  No adjustments warranted at this time. Pharmacy will continue to follow.  Murrell Converse, PharmD Clinical Pharmacist 02/02/2015

## 2015-02-02 NOTE — Progress Notes (Addendum)
[place] at [place] NAME: [name]    MR#:  [id number]  DATE OF BIRTH:  [birth year]  SUBJECTIVE:  Seen at HD. Doing well. Wants to try soft diet  REVIEW OF SYSTEMS:   Review of Systems  Constitutional: Negative for fever, chills and weight loss.  HENT: Negative for ear discharge, ear pain and nosebleeds.   Eyes: Negative for blurred vision, pain and discharge.  Respiratory: Negative for sputum production, shortness of breath, wheezing and stridor.   Cardiovascular: Negative for chest pain, palpitations, orthopnea and PND.  Gastrointestinal: Negative for nausea, vomiting, abdominal pain and diarrhea.  Genitourinary: Negative for urgency and frequency.  Musculoskeletal: Negative for back pain and joint pain.  Neurological: Negative for sensory change, speech change, focal weakness and weakness.  Psychiatric/Behavioral: Negative for depression. The patient is not nervous/anxious.   All other systems reviewed and are negative.  Nutrition: po Tolerating PT:yes Tolerating diet: yes on FULL liquid  DRUG ALLERGIES:   Allergies  Allergen Reactions  . Penicillins Hives    VITALS:  Blood pressure 97/58, pulse 75, temperature 98.6 F (37 C), temperature source Oral, resp. rate 22, height 5' 6.97" (1.701 m), weight 95.8 kg (211 lb 3.2 oz), SpO2 100 %.  PHYSICAL EXAMINATION:  GENERAL:  74 y.o.-year-old patient lying in the bed with no acute distress.  EYES: Pupils equal, round, reactive to light and accommodation. No scleral icterus. Extraocular muscles intact.  HEENT: Head atraumatic, normocephalic. Oropharynx and nasopharynx clear.  NECK:  Supple, no jugular venous distention. No thyroid enlargement, no tenderness.  LUNGS: Normal breath sounds bilaterally, no wheezing, rales,rhonchi or crepitation. No use of accessory muscles of respiration.  CARDIOVASCULAR: S1, S2 normal. No murmurs, rubs, or gallops.  ABDOMEN: Soft,  nontender, nondistended. Bowel sounds present. No organomegaly or mass.  EXTREMITIES: No pedal edema, cyanosis, or clubbing.  NEUROLOGIC: Cranial nerves II through XII are intact. Muscle strength 5/5 in all extremities. Sensation intact. Gait not checked.  PSYCHIATRIC: The patient is alert and oriented x 3.  SKIN: No obvious rash, lesion, or ulcer.    LABORATORY PANEL:   CBC  Recent Labs Lab 01/30/15 0605 02/02/15 1000  WBC 10.6 6.9  HGB 7.5* 7.0*  HCT 22.3* 21.1*  PLT 208 218   ------------------------------------------------------------------------------------------------------------------  Chemistries   Recent Labs Lab 02/01/15 0515 02/02/15 1000  NA 137 132*  K 3.3* 3.3*  CL 99* 100*  CO2 29 23  GLUCOSE 102* 180*  BUN 30* 49*  CREATININE 5.05* 6.51*  CALCIUM 7.5* 7.6*  MG 1.6* 1.8   ------------------------------------------------------------------------------------------------------------------  Cardiac Enzymes No results for input(s): TROPONINI in the last 168 hours. ------------------------------------------------------------------------------------------------------------------  RADIOLOGY:  Dg Abd 1 View  02/02/2015   CLINICAL DATA:  Ileus, followup  EXAM: ABDOMEN - 1 VIEW  COMPARISON:  Abdomen films of 01/31/2015  FINDINGS: There is both large and small bowel gas present without significant distention noted. Bilateral double-J ureteral stents remain. Iliac artery stents also are noted as well as coil embolization of the internal iliac arteries bilaterally. There is some opacity at the left lung base not well evaluated on this abdomen film.  IMPRESSION: No bowel obstruction or significant ileus.   Electronically Signed   By: Ivar Drape M.D.   On: 02/02/2015 08:17     ASSESSMENT AND PLAN:   47 m with Bladder/prostate cancer, ESRD on HD, hypertension here with confusion, SOB  * New onset Afib with RVR: due to sepsis, now back in  NSR on amio drip. - Due to  Ileus- has to stay for long time on IV amiodarone- now cardio can switch to oral. - Holding anticoagulation for now given anemia - No diltiazem given low EF per cardio  * ileus: kept NPO , NGT suction since 01/22/15. NGT removed 01/31/15, on FULL liquids.  Appreciate surgical consult. conservative mgmt for now. As no oral intake for 6 days- Dietary involved to start TPN 01/27/15. Xray shows significant improvement, and had a BM  will upgrade diet per sx and GI.  * Septic shock due to UTI  Had ureteral stents- seen by ID, will c/s urology to see if stents will need to be replaced per ID.  BP stable.  on bactrim - can stop on 5/16 per ID.  repeated cx just 2000CFU gr neg rods.  Has ureteral stents and Hx of prostate cancer- may need stent removal and to continue foley - per urology, advised to follow in office.  * GI bleed  Have Occult blood positive in Gastric secretions, and required 2 Units of blood transfusion on last 3 days.  Appreciate GI consult.  * ESRD on HD  manage per nephrology  * Anemia of Chronic Disease with worsening due to GI loss Transfused 1 unit PRBC 01/26/15 and 01/26/14.  Target Hb should be around 8 per cardiology.   * Pericardial effusion  Cardio on case- moderate Effusion per echo, but no temponade. EF is 45%  * Acute resp failure on admission  Aspiration?  Monitor.  Now on room air.  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 EF 45-50% on echo this admission.   He will need cardiac cath once stable, including anemia work up which is ongoing.   Lipitor 40 mg daily  can add low dose beta blocker (Coreg) if BP remains stable, maybe tomorrow.  Volume controlled by HD.    Overall prognosis is very poor due to multiple medical issues Spoke to Pt and his POA- [name] ( cousin)- they agreed to meet palliative care team to help decide     All the records are reviewed and case discussed with Care  Management/Social Workerr. Management plans discussed with the patient, family and they are in agreement.  CODE STATUS: FULL TOTAL TIME TAKING CARE OF THIS PATIENT: 25 minutes.   POSSIBLE D/C IN 2-3 DAYS, DEPENDING ON CLINICAL CONDITION.   Eugenio Dollins M.D on 02/02/2015 at 1:40 PM  Between 7am to 6pm - Pager - [phone]  After 6pm go to [email or web address] - password [email or web address] [facility]  Office  [phone]  CC: Primary care physician; Dion Body, MD

## 2015-02-03 ENCOUNTER — Encounter: Payer: Self-pay | Admitting: Internal Medicine

## 2015-02-03 DIAGNOSIS — I4891 Unspecified atrial fibrillation: Secondary | ICD-10-CM

## 2015-02-03 LAB — GLUCOSE, CAPILLARY
GLUCOSE-CAPILLARY: 119 mg/dL — AB (ref 65–99)
GLUCOSE-CAPILLARY: 132 mg/dL — AB (ref 65–99)
Glucose-Capillary: 126 mg/dL — ABNORMAL HIGH (ref 65–99)
Glucose-Capillary: 131 mg/dL — ABNORMAL HIGH (ref 65–99)
Glucose-Capillary: 132 mg/dL — ABNORMAL HIGH (ref 65–99)

## 2015-02-03 LAB — MAGNESIUM: MAGNESIUM: 1.6 mg/dL — AB (ref 1.7–2.4)

## 2015-02-03 LAB — BASIC METABOLIC PANEL
ANION GAP: 6 (ref 5–15)
BUN: 27 mg/dL — ABNORMAL HIGH (ref 6–20)
CALCIUM: 7.5 mg/dL — AB (ref 8.9–10.3)
CHLORIDE: 100 mmol/L — AB (ref 101–111)
CO2: 32 mmol/L (ref 22–32)
Creatinine, Ser: 3.88 mg/dL — ABNORMAL HIGH (ref 0.61–1.24)
GFR calc Af Amer: 16 mL/min — ABNORMAL LOW (ref >60)
GFR calc non Af Amer: 14 mL/min — ABNORMAL LOW (ref >60)
GLUCOSE: 115 mg/dL — AB (ref 65–99)
Potassium: 3.3 mmol/L — ABNORMAL LOW (ref 3.5–5.1)
Sodium: 138 mmol/L (ref 135–145)

## 2015-02-03 LAB — PHOSPHORUS: Phosphorus: 1.5 mg/dL — ABNORMAL LOW (ref 2.5–4.6)

## 2015-02-03 MED ORDER — AMIODARONE HCL 200 MG PO TABS
400.0000 mg | ORAL_TABLET | Freq: Two times a day (BID) | ORAL | Status: DC
  Administered 2015-02-03 (×2): 400 mg via ORAL
  Filled 2015-02-03 (×2): qty 2

## 2015-02-03 MED ORDER — ATORVASTATIN CALCIUM 40 MG PO TABS: 40.0000 mg | ORAL_TABLET | Freq: Every day | ORAL | Status: DC

## 2015-02-03 MED ORDER — AMIODARONE HCL 200 MG PO TABS: 200.0000 mg | ORAL_TABLET | Freq: Every day | ORAL | Status: DC

## 2015-02-03 MED ORDER — SEVELAMER CARBONATE 800 MG PO TABS
800.0000 mg | ORAL_TABLET | Freq: Three times a day (TID) | ORAL | Status: DC
  Administered 2015-02-03 (×2): 800 mg via ORAL
  Filled 2015-02-03 (×2): qty 1

## 2015-02-03 MED ORDER — SULFAMETHOXAZOLE-TRIMETHOPRIM 800-160 MG PO TABS: 2.0000 | ORAL_TABLET | ORAL | Status: AC

## 2015-02-03 MED ORDER — MAGNESIUM SULFATE IN D5W 10-5 MG/ML-% IV SOLN
1.0000 g | Freq: Once | INTRAVENOUS | Status: AC
  Administered 2015-02-03: 1 g via INTRAVENOUS
  Filled 2015-02-03: qty 100

## 2015-02-03 MED ORDER — INSULIN ASPART 100 UNIT/ML ~~LOC~~ SOLN: 0.0000 [IU] | Freq: Three times a day (TID) | SUBCUTANEOUS | Status: DC

## 2015-02-03 MED ORDER — NEPRO/CARBSTEADY PO LIQD
237.0000 mL | Freq: Two times a day (BID) | ORAL | Status: DC
  Administered 2015-02-03 – 2015-02-04 (×3): 237 mL via ORAL

## 2015-02-03 MED ORDER — BUDESONIDE-FORMOTEROL FUMARATE 160-4.5 MCG/ACT IN AERO
2.0000 | INHALATION_SPRAY | Freq: Two times a day (BID) | RESPIRATORY_TRACT | Status: DC
  Administered 2015-02-03 (×2): 2 via RESPIRATORY_TRACT
  Filled 2015-02-03: qty 6

## 2015-02-03 MED ORDER — NEPRO/CARBSTEADY PO LIQD: 237.0000 mL | Freq: Two times a day (BID) | ORAL | Status: DC

## 2015-02-03 MED ORDER — TIOTROPIUM BROMIDE MONOHYDRATE 18 MCG IN CAPS
1.0000 | ORAL_CAPSULE | Freq: Every day | RESPIRATORY_TRACT | Status: DC
  Administered 2015-02-03: 18 ug via RESPIRATORY_TRACT
  Filled 2015-02-03: qty 5

## 2015-02-03 MED ORDER — AMIODARONE HCL 400 MG PO TABS: 400.0000 mg | ORAL_TABLET | Freq: Two times a day (BID) | ORAL | Status: DC

## 2015-02-03 MED ORDER — MAGNESIUM OXIDE 400 (241.3 MG) MG PO TABS
400.0000 mg | ORAL_TABLET | Freq: Every day | ORAL | Status: DC
  Administered 2015-02-03: 400 mg via ORAL
  Filled 2015-02-03: qty 1

## 2015-02-03 MED ORDER — PANTOPRAZOLE SODIUM 40 MG PO TBEC: 40.0000 mg | DELAYED_RELEASE_TABLET | Freq: Every day | ORAL | Status: DC

## 2015-02-03 NOTE — Progress Notes (Signed)
[place] at [place] NAME: Sierra Bissonette    MR#:  [id number]  DATE OF BIRTH:  [birth year]  SUBJECTIVE:  Doing well. Very positive attitude about himself. Tolerating po diet well  REVIEW OF SYSTEMS:   ROS Nutrition: po Tolerating PT:yes Tolerating diet: yes on soft diet  DRUG ALLERGIES:   Allergies  Allergen Reactions  . Penicillins Hives    VITALS:  Blood pressure 95/51, pulse 69, temperature 98.4 F (36.9 C), temperature source Oral, resp. rate 19, height 5' 6.97" (1.701 m), weight 95.664 kg (210 lb 14.4 oz), SpO2 100 %.  PHYSICAL EXAMINATION:  GENERAL:  74 y.o.-year-old patient lying in the bed with no acute distress.  EYES: Pupils equal, round, reactive to light and accommodation. No scleral icterus. Extraocular muscles intact.  HEENT: Head atraumatic, normocephalic. Oropharynx and nasopharynx clear.  NECK:  Supple, no jugular venous distention. No thyroid enlargement, no tenderness.  LUNGS: Normal breath sounds bilaterally, no wheezing, rales,rhonchi or crepitation. No use of accessory muscles of respiration.  CARDIOVASCULAR: S1, S2 normal. No murmurs, rubs, or gallops.  ABDOMEN: Soft, nontender, nondistended. Bowel sounds present. No organomegaly or mass.  EXTREMITIES: No pedal edema, cyanosis, or clubbing.  NEUROLOGIC: Cranial nerves II through XII are intact. Muscle strength 5/5 in all extremities. Sensation intact. Gait not checked.  PSYCHIATRIC: The patient is alert and oriented x 3.  SKIN: No obvious rash, lesion, or ulcer.  -GU foley (chronic) -Vascular right IJ +   LABORATORY PANEL:   CBC  Recent Labs Lab 01/30/15 0605 02/02/15 1000  WBC 10.6 6.9  HGB 7.5* 7.0*  HCT 22.3* 21.1*  PLT 208 218   ------------------------------------------------------------------------------------------------------------------  Chemistries   Recent Labs Lab 02/02/15 1000 02/03/15 0343  NA 132* 138  K 3.3* 3.3*   CL 100* 100*  CO2 23 32  GLUCOSE 180* 115*  BUN 49* 27*  CREATININE 6.51* 3.88*  CALCIUM 7.6* 7.5*  MG 1.8 1.6*    RADIOLOGY:  Dg Abd 1 View  02/02/2015   CLINICAL DATA:  Ileus, followup  EXAM: ABDOMEN - 1 VIEW  COMPARISON:  Abdomen films of 01/31/2015  FINDINGS: There is both large and small bowel gas present without significant distention noted. Bilateral double-J ureteral stents remain. Iliac artery stents also are noted as well as coil embolization of the internal iliac arteries bilaterally. There is some opacity at the left lung base not well evaluated on this abdomen film.  IMPRESSION: No bowel obstruction or significant ileus.   Electronically Signed   By: Ivar Drape M.D.   On: 02/02/2015 08:17     ASSESSMENT AND PLAN:   22 m with Bladder/prostate cancer, ESRD on HD, hypertension here with confusion, SOB  * New onset Afib with RVR: due to sepsis, now back in NSR on amio drip. -change to po amiodarone 400 mg bid for 5 days and then 300 mg daily per Dr Fletcher Anon - Holding anticoagulation for now given anemia - No diltiazem given low EF per cardio  * ileus:Resolved - NGT suction since 01/22/15. NGT removed 01/31/15, on soft diet. -per pt had 2 BM's -on TPN since 01/27/15.--->d/c later today -Xray shows significant improvement  * Septic shock due to UTI  Had ureteral stents- seen by ID, will c/s urology to see if stents will need to be replaced per ID.  BP stable.  on bactrim - can stop on 5/16 per ID. -d/c Iv cipro (not sure why he was on it) -repeated cx  just 2000CFU gr neg rods. -Has ureteral stents and Hx of prostate cancer- may need stent removal and to continue foley  - per urology, advised to follow in office next week for Lupron shot. Dr Erlene Quan recommends to leave foley for now and she will remove as out pt  * GI bleed  Have Occult blood positive in Gastric secretions, and required 2 Units of blood transfusion on last 3 days.  Appreciate GI consult.No luminal  evaluation suggested  * ESRD on HD  manage per nephrology  * Anemia of Chronic Disease with worsening due to GI loss Transfused 1 unit PRBC each on 01/26/15 and 01/26/14.  Target Hb should be around 8 per cardiology.   * Pericardial effusion  Cardio on case- moderate Effusion per echo, but no tamponade. 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 EF 45-50% on echo this admission.    Lipitor 40 mg daily  Volume controlled by HD.   *D/c planning to [facility] tomorrow  Overall prognosis is very poor due to multiple medical issues Spoke to Pt and he is in agreement with the overall plan.  All the records are reviewed and case discussed with Care Management/Social Workerr. Management plans discussed with the patient, family and they are in agreement.  CODE STATUS: FULL TOTAL TIME TAKING CARE OF THIS PATIENT: 25 minutes.   POSSIBLE D/C IN am DEPENDING ON CLINICAL CONDITION.   Nayelly Laughman M.D on 02/03/2015 at 8:23 AM  Between 7am to 6pm - Pager - [phone]  After 6pm go to [email or web address] - password [email or web address] [facility]  Office  [phone]  CC: Primary care physician; Dion Body, MD

## 2015-02-03 NOTE — Progress Notes (Signed)
Pt for possible d/c to [facility] tomorrow pending medical clearance. CSW updated admissions, Jonelle Sidle, at [facility]. CSW will follow. Wandra Feinstein, MSW, [place] [phone]

## 2015-02-03 NOTE — Progress Notes (Signed)
Subjective:   Tolerating PO now No acute events Denies SOB or pain  Objective:  Vital signs in last 24 hours:  Temp:  [97.8 F (36.6 C)-98.4 F (36.9 C)] 97.8 F (36.6 C) (05/13 1129) Pulse Rate:  [69-83] 78 (05/13 1129) Resp:  [16-30] 20 (05/13 1129) BP: (95-109)/(47-67) 95/55 mmHg (05/13 1129) SpO2:  [85 %-100 %] 95 % (05/13 1129) Weight:  [94.847 kg (209 lb 1.6 oz)-95.664 kg (210 lb 14.4 oz)] 95.664 kg (210 lb 14.4 oz) (05/13 0338)  Weight change: -0.317 kg (-11.2 oz) Filed Weights   02/02/15 1400 02/02/15 1521 02/03/15 0338  Weight: 94.9 kg (209 lb 3.5 oz) 94.847 kg (209 lb 1.6 oz) 95.664 kg (210 lb 14.4 oz)    Intake/Output: I/O last 3 completed shifts: In: 843.7 [I.V.:183.7] Out: 3676 [Urine:2675; Other:1000; Stool:1]   Intake/Output this shift:     General: not in acute distress Neck: supple, RIJ central line CVS: irregular rate and rhythm Resp: clear  ABD: +soft,  EXT: no edema Neuro: alert and oriented Access: Left arm AVF   Basic Metabolic Panel:  Recent Labs Lab 01/30/15 0605 01/31/15 0520 02/01/15 0515 02/02/15 1000 02/03/15 0343  NA 137 137 137 132* 138  K 3.5 3.6 3.3* 3.3* 3.3*  CL 101 103 99* 100* 100*  CO2 27 25 29 23  32  GLUCOSE 111* 110* 102* 180* 115*  BUN 28* 39* 30* 49* 27*  CREATININE 6.29* 7.03* 5.05* 6.51* 3.88*  CALCIUM 7.4* 7.7* 7.5* 7.6* 7.5*  MG 1.5* 1.7 1.6* 1.8 1.6*  PHOS 3.3 3.6 2.5 2.6 1.5*    Liver Function Tests:  Recent Labs Lab 02/02/15 1000  ALBUMIN 1.9*   No results for input(s): LIPASE, AMYLASE in the last 168 hours. No results for input(s): AMMONIA in the last 168 hours.  CBC:  Recent Labs Lab 01/28/15 0454 01/29/15 0633 01/30/15 0605 02/02/15 1000  WBC 12.6* 10.9* 10.6 6.9  HGB 7.7* 7.5* 7.5* 7.0*  HCT 23.1* 23.3* 22.3* 21.1*  MCV 87.8 88.6 87.9 87.7  PLT 258 236 208 218    Cardiac Enzymes: No results for input(s): CKTOTAL, CKMB, CKMBINDEX, TROPONINI in the last 168  hours.  BNP: Invalid input(s): POCBNP  CBG:  Recent Labs Lab 02/02/15 1445 02/02/15 2137 02/03/15 0309 02/03/15 0723 02/03/15 1146  GLUCAP 118* 112* 126* 119* 132*    Microbiology: Results for orders placed or performed during the hospital encounter of 01/18/15  Culture, blood (single)     Status: None (Preliminary result)   Collection Time: 01/18/15  7:11 PM  Result Value Ref Range Status   Micro Text Report   Preliminary       COMMENT                   NO GROWTH IN 80 HOURS   ANTIBIOTIC                                                      Culture, blood (single)     Status: None (Preliminary result)   Collection Time: 01/18/15  7:11 PM  Result Value Ref Range Status   Micro Text Report   Preliminary       COMMENT                   NO GROWTH IN 48 HOURS  ANTIBIOTIC                                                      Urine culture     Status: None   Collection Time: 01/18/15  7:36 PM  Result Value Ref Range Status   Micro Text Report   Final       SOURCE: INDWELLING CATHETER    COMMENT                   NO GROWTH IN 36 HOURS   ANTIBIOTIC                                                      Urine culture     Status: None   Collection Time: 01/20/15  4:09 PM  Result Value Ref Range Status   Micro Text Report   Final       SOURCE: INDWELLING CATH    ORGANISM 1                2000 CFU/ML ENTEROBACTER SPECIES   COMMENT                   UNABLE TO FURTHER SPECIATE   COMMENT                   -   ANTIBIOTIC                    ORG#1     CEFAZOLIN                     R         CEFOXITIN                     R         CEFTRIAXONE                   R         CIPROFLOXACIN                 I         ERTAPENEM                     S         GENTAMICIN                    S         IMIPENEM                      S         LEVOFLOXACIN                  I         NITROFURANTOIN                I         TRIMETH/SULFA                 S             Coagulation  Studies: No results for input(s): LABPROT, INR in the last 72 hours.  Urinalysis: No results for input(s): COLORURINE, LABSPEC, PHURINE, GLUCOSEU, HGBUR, BILIRUBINUR, KETONESUR, PROTEINUR, UROBILINOGEN, NITRITE, LEUKOCYTESUR in the last 72 hours.  Invalid input(s): APPERANCEUR    Imaging: Dg Abd 1 View  02/02/2015   CLINICAL DATA:  Ileus, followup  EXAM: ABDOMEN - 1 VIEW  COMPARISON:  Abdomen films of 01/31/2015  FINDINGS: There is both large and small bowel gas present without significant distention noted. Bilateral double-J ureteral stents remain. Iliac artery stents also are noted as well as coil embolization of the internal iliac arteries bilaterally. There is some opacity at the left lung base not well evaluated on this abdomen film.  IMPRESSION: No bowel obstruction or significant ileus.   Electronically Signed   By: Ivar Drape M.D.   On: 02/02/2015 08:17     Medications:   . TPN (CLINIMIX) Adult without lytes 30 mL/hr at 02/03/15 0833   . [START ON 02/08/2015] amiodarone  200 mg Oral Daily  . amiodarone  400 mg Oral BID  . atorvastatin  40 mg Oral Daily  . budesonide-formoterol  2 puff Inhalation BID  . feeding supplement (NEPRO CARB STEADY)  237 mL Oral BID BM  . insulin aspart  0-9 Units Subcutaneous Q6H  . magnesium oxide  400 mg Oral QHS  . sevelamer carbonate  800 mg Oral TID WC  . sodium chloride  10-40 mL Intracatheter Q12H  . sulfamethoxazole-trimethoprim  2 tablet Oral Q24H  . tiotropium  1 capsule Inhalation Daily   acetaminophen, diphenhydrAMINE, ondansetron (ZOFRAN) IV, phenol, sodium chloride, sodium chloride  Assessment/ Plan:  74 y.o. 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 Hemodialysis  - Continue TTS schedule.   - plan to d/c to [facility]  2. Anemia of chronic kidney disease: D63.1  - holding epo due to prostate cancer.  - transfusion on 5/5 and 5/6   3. Secondary Hyperparathyroidism: N25.81  -start binder when able to take normal PO  4. Ileus: K56.7 Continue supportive care. Appreciate surgery input. -  tolerating clears  5. A. Fib with RVR: I48.91  - followed by cardiology    LOS: [place] 5/13/201612:43 PM

## 2015-02-03 NOTE — Progress Notes (Signed)
Physical Therapy Treatment Patient Details Name: [name] MRN: [id number] DOB: 1941-03-04 Today's Date: 02/03/2015    History of Present Illness 74 yo male with onset of septic shock and respriatory distress, hypotension and atelectasis, recent bladder history, PMHx:  a-fib with RVR, prostate CA, ESRD T TH S, neurogenic bladder, AAA, anemia,     PT Comments    Pt is asking about going to rehab tomorrow and is looking forward.  His family not in but did exert to sit up bedside and perform resisted there ex to BLE's.  Very motivated and will be able to continue to work on sit to stand in new location.  Follow Up Recommendations  CIR     Equipment Recommendations  None recommended by PT    Recommendations for Other Services Rehab consult     Precautions / Restrictions Precautions Precautions: Fall Restrictions Weight Bearing Restrictions: No    Mobility  Bed Mobility Overal bed mobility: Needs Assistance Bed Mobility: Rolling;Supine to Sit;Sit to Supine Rolling: Mod assist   Supine to sit: Max assist Sit to supine: Max assist      Transfers Overall transfer level: Needs assistance               General transfer comment: PT attempted to get pt OOB and was asked by CNA not to do so as he did not feel even with help he could get pt back to bed.  Ambulation/Gait                 Stairs            Wheelchair Mobility    Modified Rankin (Stroke Patients Only)       Balance Overall balance assessment: Needs assistance Sitting-balance support: Feet supported;Bilateral upper extremity supported Sitting balance-Leahy Scale: Poor Sitting balance - Comments: fatigued and did need assistance to control for ther ex                            Cognition Arousal/Alertness: Awake/alert Behavior During Therapy: WFL for tasks assessed/performed Overall Cognitive Status: Within Functional Limits for tasks assessed                       Exercises General Exercises - Lower Extremity Ankle Circles/Pumps: AROM;AAROM;Both;5 reps Quad Sets: AROM;Both;10 reps Long Arc Quad: AROM;Both;10 reps;Strengthening;AAROM Heel Slides: AROM;AAROM;Strengthening;Both;10 reps Hip ABduction/ADduction: Strengthening;10 reps Hip Flexion/Marching: AAROM;Both;10 reps    General Comments General comments (skin integrity, edema, etc.): Lab values are still low with Ca 7.5 and BP was 95/55      Pertinent Vitals/Pain Pain Assessment: No/denies pain    Home Living                      Prior Function            PT Goals (current goals can now be found in the care plan section) Acute Rehab PT Goals Patient Stated Goal: I want to get my strength back Progress towards PT goals: Progressing toward goals    Frequency  Min 2X/week    PT Plan Current plan remains appropriate    Co-evaluation             End of Session Equipment Utilized During Treatment: Oxygen Activity Tolerance: Patient limited by fatigue;Patient limited by lethargy Patient left: with call bell/phone within reach;in bed;with bed alarm set     Time: 1352-1425 PT Time Calculation (min) (ACUTE  ONLY): 33 min  Charges:  $Therapeutic Exercise: 8-22 mins $Therapeutic Activity: 8-22 mins                    G Codes:      Ramond Dial 02-13-2015, 4:32 PM   Mee Hives, PT MS Acute Rehab Dept. Number: [facility] [phone] and [place] [phone]

## 2015-02-03 NOTE — Care Management (Signed)
Attending states that patient is to discharge 02/04/2015 to [facility].  Patient is in agreement.  Orders will be prepared so can be sent to facility today in advance of discharge

## 2015-02-03 NOTE — Progress Notes (Signed)
Patient resting in bed at this time , no distress noted , tpn and amiodarone drip discontinue , for dialysis in am , denies pain no distress noted , care ongoing .

## 2015-02-03 NOTE — Progress Notes (Signed)
Patient: [name] / Admit Date: 01/18/2015 / Date of Encounter: 02/03/2015, 8:24 AM   Subjective: He is maintaining in NSR with Amiodarone. He is able to take by mouth now.  Echo (5/6) with EF 45-50%, diffuse hypokinesis, moderate pericardial effusion without tamponade.    Review of Systems: ROS  Objective: Telemetry: NSR Physical Exam: Blood pressure 95/51, pulse 69, temperature 98.4 F (36.9 C), temperature source Oral, resp. rate 19, height 5' 6.97" (1.701 m), weight 210 lb 14.4 oz (95.664 kg), SpO2 100 %. Body mass index is 33.06 kg/(m^2). General: Well developed, well nourished, in no acute distress. Head: Normocephalic, atraumatic, sclera non-icteric, no xanthomas, nares are without discharge. Neck: Negative for carotid bruits. JVP not elevated. Lungs: Clear bilaterally to auscultation without wheezes, rales, or rhonchi. Breathing is unlabored. Heart: RRR S1 S2 without murmurs, rubs, or gallops.  Abdomen: Soft, non-tender, non-distended with normoactive bowel sounds. No rebound/guarding. Extremities: No clubbing or cyanosis. No edema. Distal pedal pulses are 2+ and equal bilaterally. Neuro: Alert and oriented X 3. Moves all extremities spontaneously. Psych:  Responds to questions appropriately with a normal affect.   Intake/Output Summary (Last 24 hours) at 02/03/15 0824 Last data filed at 02/03/15 0636  Gross per 24 hour  Intake      0 ml  Output   2726 ml  Net  -2726 ml    Inpatient Medications:  . atorvastatin  40 mg Oral Daily  . feeding supplement (NEPRO CARB STEADY)  237 mL Oral BID BM  . insulin aspart  0-9 Units Subcutaneous Q6H  . magnesium sulfate 1 - 4 g bolus IVPB  1 g Intravenous Once  . pantoprazole (PROTONIX) IV  40 mg Intravenous Q24H  . sodium chloride  10-40 mL Intracatheter Q12H  . sulfamethoxazole-trimethoprim  2 tablet Oral Q24H   Infusions:  . amiodarone 30 mg/hr (02/03/15 0724)  . TPN [facility]) Adult without lytes 60 mL/hr at  02/02/15 1838    Labs:  Recent Labs  02/02/15 1000 02/03/15 0343  NA 132* 138  K 3.3* 3.3*  CL 100* 100*  CO2 23 32  GLUCOSE 180* 115*  BUN 49* 27*  CREATININE 6.51* 3.88*  CALCIUM 7.6* 7.5*  MG 1.8 1.6*  PHOS 2.6 1.5*    Recent Labs  02/02/15 1000  ALBUMIN 1.9*    Recent Labs  02/02/15 1000  WBC 6.9  HGB 7.0*  HCT 21.1*  MCV 87.7  PLT 218   No results for input(s): CKTOTAL, CKMB, TROPONINI in the last 72 hours. Invalid input(s): POCBNP No results for input(s): HGBA1C in the last 72 hours.   Weights: Filed Weights   02/02/15 1400 02/02/15 1521 02/03/15 0338  Weight: 209 lb 3.5 oz (94.9 kg) 209 lb 1.6 oz (94.847 kg) 210 lb 14.4 oz (95.664 kg)     Radiology/Studies:  See Imaging results.    Assessment and Plan  74 y.o. male with history of ESRD on HD Tuesday, Thursday, and Saturday, prostate CA s/p BCG injections and Lupron injections, bladder CA s/p ureteral stents and neurogenic bladder, abdominal aortic aneurysm and iliac aneurysm, status post repair who presented to [facility] ED on 4/27 in acute respiratory distress x 1 day and was found to be hypotensive with blood pressures in the 47S sytolic and was found to be in new onset a-fib with RVR.  1. New onset a-fib with RVR and hypotension:  -In setting of septic shock. -Brief episode of recurrence of a-fib with RVR on the afternoon of  5/5, was placed on amiodarone gtt  -Now back in NSR on amiodarone gtt. Can switch to Amiodarone 400 mg po bid for 5 days then 200 mg once daily.  -Holding anticoagulation for now given anemia   2. Pericardial effusion:  -Moderate circumferential effusion on echo, no tamponade. -Hemodynamically stable    3. 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 EF 45-50% on echo this admission.  -He will need further ischemic work up as an outpatient.  -Lipitor 40 mg daily  4. Septic shock/urosepsis:  -Enterobacter urosepsis.  -Abx per primary  service.  -Hypotension resolved.   5. ESRD: -On HD  6. SBO:   -NG tube in place  7. Anemia: -Maintain hgb approximately 8  Follow up in 2-3 weeks after discharge. Will sign off. Please call with questions.   Signed,  Kathlyn Sacramento, MD   02/03/2015 8:24 AM

## 2015-02-03 NOTE — Progress Notes (Signed)
PARENTERAL NUTRITION CONSULT NOTE - FOLLOW UP  Pharmacy Consult for TPN Electrolyte/Glucose MGMT  Allergies  Allergen Reactions  . Penicillins Hives    Patient Measurements: Height: 5' 6.97" (170.1 cm) Weight: 210 lb 14.4 oz (95.664 kg) IBW/kg (Calculated) : 66.03  Vital Signs: Temp: 98.4 F (36.9 C) (05/13 0338) Temp Source: Oral (05/13 0338) BP: 95/51 mmHg (05/13 0338) Pulse Rate: 69 (05/13 0338) Intake/Output from previous day: 05/12 0701 - 05/13 0700 In: -  Out: 2726 [Urine:1725; Stool:1] Intake/Output from this shift:    Labs:  Recent Labs  02/02/15 1000  WBC 6.9  HGB 7.0*  HCT 21.1*  PLT 218     Recent Labs  02/01/15 0515 02/02/15 1000 02/03/15 0343  NA 137 132* 138  K 3.3* 3.3* 3.3*  CL 99* 100* 100*  CO2 29 23 32  GLUCOSE 102* 180* 115*  BUN 30* 49* 27*  CREATININE 5.05* 6.51* 3.88*  CALCIUM 7.5* 7.6* 7.5*  MG 1.6* 1.8 1.6*  PHOS 2.5 2.6 1.5*  ALBUMIN  --  1.9*  --    Estimated Creatinine Clearance: 18.7 mL/min (by C-G formula based on Cr of 3.88).    Recent Labs  02/02/15 2137 02/03/15 0309 02/03/15 0723  GLUCAP 112* 126* 119*    Medications:  Scheduled:  . atorvastatin  40 mg Oral Daily  . ciprofloxacin  400 mg Intravenous Q24H  . insulin aspart  0-9 Units Subcutaneous Q6H  . magnesium sulfate 1 - 4 g bolus IVPB  1 g Intravenous Once  . pantoprazole (PROTONIX) IV  40 mg Intravenous Q24H  . sodium chloride  10-40 mL Intracatheter Q12H  . sulfamethoxazole-trimethoprim  2 tablet Oral Q24H   Infusions:  . amiodarone 30 mg/hr (02/03/15 0724)  . TPN (CLINIMIX) Adult without lytes 60 mL/hr at 02/02/15 1838    Insulin Requirements in the past 24 hours:  1 unit SSI  Current Nutrition:  Clinimix 5/15 with MVI and trace elements at 60 ml/hr.   Assessment:  Potassium 3.3. Magnesium 1.6. Phosphorus 1.5. Will not order potassium or phosphorus supplementation at this time as potassium may be supplemented with HD sessions via K bath  and patient with ESRD is at risk of retaining phosphorus.   All other labs within acceptable range.   Patient has used 1 unit of SSI in past 24 hours.  Plan:  Will supplement magnesium with Magnesium 1 gm IV once.  No other electrolyte supplementation warranted at this time.  Continue current SSI orders.   Will order labs in AM.    Pharmacy will continue to follow.  Murrell Converse, PharmD Clinical Pharmacist 02/03/2015

## 2015-02-03 NOTE — Progress Notes (Signed)
MEDICATION RELATED CONSULT NOTE - FOLLOW UP   Pharmacy Consult for Renal dosing in HD Patient Indication: Medication adjustments in HD patient  Allergies  Allergen Reactions  . Penicillins Hives    Patient Measurements: Height: 5' 6.97" (170.1 cm) Weight: 210 lb 14.4 oz (95.664 kg) IBW/kg (Calculated) : 66.03   Labs:  Recent Labs  02/01/15 0515 02/02/15 1000 02/03/15 0343  WBC  --  6.9  --   HGB  --  7.0*  --   HCT  --  21.1*  --   PLT  --  218  --   CREATININE 5.05* 6.51* 3.88*  MG 1.6* 1.8 1.6*  PHOS 2.5 2.6 1.5*  ALBUMIN  --  1.9*  --    Estimated Creatinine Clearance: 18.7 mL/min (by C-G formula based on Cr of 3.88).     Medications:  Prescriptions prior to admission  Medication Sig Dispense Refill Last Dose  . amLODipine (NORVASC) 5 MG tablet Take 5 mg by mouth at bedtime.     . budesonide-formoterol (SYMBICORT) 160-4.5 MCG/ACT inhaler Inhale 2 puffs into the lungs 2 (two) times daily.     . chlorpheniramine (CHLOR-TRIMETON) 4 MG tablet Take 4 mg by mouth every 4 (four) hours as needed for allergies.     Marland Kitchen loperamide (IMODIUM) 2 MG capsule Take 2 mg by mouth every 4 (four) hours as needed for diarrhea or loose stools.     . magnesium oxide (MAG-OX) 400 MG tablet Take 400 mg by mouth at bedtime.     . NONFORMULARY OR COMPOUNDED ITEM Take 1 tablet by mouth daily. Rena-Vite Vitamin B Complex with C and Folic Acid     . sevelamer carbonate (RENVELA) 800 MG tablet Take 800 mg by mouth 3 (three) times daily with meals.     . Tiotropium Bromide Monohydrate 2.5 MCG/ACT AERS Inhale 1 puff into the lungs daily.      Scheduled:  . atorvastatin  40 mg Oral Daily  . ciprofloxacin  400 mg Intravenous Q24H  . insulin aspart  0-9 Units Subcutaneous Q6H  . magnesium sulfate 1 - 4 g bolus IVPB  1 g Intravenous Once  . pantoprazole (PROTONIX) IV  40 mg Intravenous Q24H  . sodium chloride  10-40 mL Intracatheter Q12H  . sulfamethoxazole-trimethoprim  2 tablet Oral Q24H     Assessment:  Patient with ESRD requiring HD on Tues, Thurs, Sat schedule.  Goal of Therapy:  Adjustment of medications for renal function.  Plan:  No adjustments warranted at this time. Pharmacy will continue to follow.  Murrell Converse, PharmD Clinical Pharmacist 02/03/2015

## 2015-02-03 NOTE — Progress Notes (Signed)
Nutrition Follow-up  DOCUMENTATION CODES:     INTERVENTION: Meals and Snacks: Cater to patient preferences Medical Food Supplement Therapy: will send Nepro BID for added nutrition, MD Posey Pronto agreeable PN: Per MD Posey Pronto, will turn TPN back to half 5%AA/15%Dextrose without electrolytes to 72mL/hr and will be discontinued as ordered by 18:00 today.   NUTRITION DIAGNOSIS:  Inadequate oral intake related to altered GI function as evidenced by NPO status, percent weight loss, energy intake < or equal to 50% for > or equal to 5 days; being addressed with TPN and improved with advancement in diet order.  GOAL:  Provide needs based on ASPEN/SCCM guidelines, Patient will meet greater than or equal to 90% of their needs  MONITOR:  Diet advancement, Labs, Weight trends, I & O's  REASON FOR ASSESSMENT:  Other (Comment) (RD Follow Up) Assessment of nutrition requirement/status (TPN Follow/ Diet Progression)  ASSESSMENT:  Pt diet advanced to Renal diet order with improvement clinically per MD Posey Pronto; with possible discharge tomorrow.  Medications: reviewed Labs: Electrolyte and Renal Profile:    Recent Labs Lab 02/01/15 0515 02/02/15 1000 02/03/15 0343  BUN 30* 49* 27*  CREATININE 5.05* 6.51* 3.88*  NA 137 132* 138  K 3.3* 3.3* 3.3*  MG 1.6* 1.8 1.6*  PHOS 2.5 2.6 1.5*   Glucose Profile:  Recent Labs  02/02/15 2137 02/03/15 0309 02/03/15 0723  GLUCAP 112* 126* 119*   Protein Profile:  Recent Labs Lab 02/02/15 1000  ALBUMIN 1.9*     Height:  Ht Readings from Last 1 Encounters:  01/20/15 5' 6.97" (1.701 m)    Weight:  Wt Readings from Last 1 Encounters:  02/03/15 210 lb 14.4 oz (95.664 kg)    Ideal Body Weight:     Wt Readings from Last 10 Encounters:  02/03/15 210 lb 14.4 oz (95.664 kg)    BMI:  Body mass index is 33.06 kg/(m^2).  Estimated Nutritional Needs:  Kcal:  3335-4562 kcal/ day (BEE: 1373 x 1.2 AF x 1.0-1.2 IF)- using IBW of 67  kg  Protein:  80-101 g Pro/day (1.2-1.5 g Pro/ kg/ day)- using IBW of 67 kg  Fluid:  1084ml + UOP  Skin:  Wound (see comment) (Stage II Sacrum)  Diet Order:  Diet renal with fluid restriction Fluid restriction:: 1200 mL Fluid; Room service appropriate?: Yes; Fluid consistency:: Thin TPN (CLINIMIX) Adult without lytes  EDUCATION NEEDS:  No education needs identified at this time   Intake/Output Summary (Last 24 hours) at 02/03/15 0815 Last data filed at 02/03/15 0636  Gross per 24 hour  Intake      0 ml  Output   2726 ml  Net  -2726 ml    Last BM:  5/12 multiple stools  MODERATE Care Level  Dwyane Luo, RD, LDN Pager [phone]

## 2015-02-04 LAB — GLUCOSE, CAPILLARY
GLUCOSE-CAPILLARY: 102 mg/dL — AB (ref 65–99)
Glucose-Capillary: 114 mg/dL — ABNORMAL HIGH (ref 65–99)

## 2015-02-04 LAB — BASIC METABOLIC PANEL
Anion gap: 9 (ref 5–15)
BUN: 40 mg/dL — ABNORMAL HIGH (ref 6–20)
CO2: 27 mmol/L (ref 22–32)
CREATININE: 5.29 mg/dL — AB (ref 0.61–1.24)
Calcium: 7.9 mg/dL — ABNORMAL LOW (ref 8.9–10.3)
Chloride: 103 mmol/L (ref 101–111)
GFR calc Af Amer: 11 mL/min — ABNORMAL LOW (ref >60)
GFR calc non Af Amer: 10 mL/min — ABNORMAL LOW (ref >60)
GLUCOSE: 104 mg/dL — AB (ref 65–99)
POTASSIUM: 3.2 mmol/L — AB (ref 3.5–5.1)
SODIUM: 139 mmol/L (ref 135–145)

## 2015-02-04 LAB — MAGNESIUM: Magnesium: 2 mg/dL (ref 1.7–2.4)

## 2015-02-04 LAB — PHOSPHORUS: Phosphorus: 1.9 mg/dL — ABNORMAL LOW (ref 2.5–4.6)

## 2015-02-04 NOTE — Progress Notes (Signed)
Subjective:   Tolerating PO now- ate almost 100% of his breakfast today No acute events Denies SOB or pain  Objective:  Vital signs in last 24 hours:  Temp:  [97.5 F (36.4 C)-97.8 F (36.6 C)] 97.5 F (36.4 C) (05/14 0945) Pulse Rate:  [76-81] 81 (05/14 1100) Resp:  [19-25] 25 (05/14 1100) BP: (87-117)/(47-61) 94/56 mmHg (05/14 1100) SpO2:  [95 %-96 %] 96 % (05/14 0945) Weight:  [96.163 kg (212 lb)] 96.163 kg (212 lb) (05/14 0412)  Weight change: 0.363 kg (12.8 oz) Filed Weights   02/02/15 1521 02/03/15 0338 02/04/15 0412  Weight: 94.847 kg (209 lb 1.6 oz) 95.664 kg (210 lb 14.4 oz) 96.163 kg (212 lb)    Intake/Output: I/O last 3 completed shifts: In: 570  Out: 776 [Urine:775; Stool:1]   Intake/Output this shift:  Total I/O In: -  Out: 650 [Urine:650]  General: not in acute distress Neck: supple, RIJ central line CVS: irregular rate and rhythm Resp: clear  ABD: +soft,  EXT: no edema Neuro: alert and oriented Access: Left arm AVF   Basic Metabolic Panel:  Recent Labs Lab 01/31/15 0520 02/01/15 0515 02/02/15 1000 02/03/15 0343 02/04/15 0713  NA 137 137 132* 138 139  K 3.6 3.3* 3.3* 3.3* 3.2*  CL 103 99* 100* 100* 103  CO2 25 29 23  32 27  GLUCOSE 110* 102* 180* 115* 104*  BUN 39* 30* 49* 27* 40*  CREATININE 7.03* 5.05* 6.51* 3.88* 5.29*  CALCIUM 7.7* 7.5* 7.6* 7.5* 7.9*  MG 1.7 1.6* 1.8 1.6* 2.0  PHOS 3.6 2.5 2.6 1.5* 1.9*    Liver Function Tests:  Recent Labs Lab 02/02/15 1000  ALBUMIN 1.9*   No results for input(s): LIPASE, AMYLASE in the last 168 hours. No results for input(s): AMMONIA in the last 168 hours.  CBC:  Recent Labs Lab 01/29/15 0633 01/30/15 0605 02/02/15 1000  WBC 10.9* 10.6 6.9  HGB 7.5* 7.5* 7.0*  HCT 23.3* 22.3* 21.1*  MCV 88.6 87.9 87.7  PLT 236 208 218    Cardiac Enzymes: No results for input(s): CKTOTAL, CKMB, CKMBINDEX, TROPONINI in the last 168 hours.  BNP: Invalid input(s): POCBNP  CBG:  Recent  Labs Lab 02/03/15 0723 02/03/15 1146 02/03/15 1505 02/03/15 2102 02/04/15 0720  GLUCAP 119* 132* 131* 132* 102*    Microbiology: Results for orders placed or performed during the hospital encounter of 01/18/15  Culture, blood (single)     Status: None (Preliminary result)   Collection Time: 01/18/15  7:11 PM  Result Value Ref Range Status   Micro Text Report   Preliminary       COMMENT                   NO GROWTH IN 33 HOURS   ANTIBIOTIC                                                      Culture, blood (single)     Status: None (Preliminary result)   Collection Time: 01/18/15  7:11 PM  Result Value Ref Range Status   Micro Text Report   Preliminary       COMMENT                   NO GROWTH IN 48 HOURS   ANTIBIOTIC  Urine culture     Status: None   Collection Time: 01/18/15  7:36 PM  Result Value Ref Range Status   Micro Text Report   Final       SOURCE: INDWELLING CATHETER    COMMENT                   NO GROWTH IN 36 HOURS   ANTIBIOTIC                                                      Urine culture     Status: None   Collection Time: 01/20/15  4:09 PM  Result Value Ref Range Status   Micro Text Report   Final       SOURCE: INDWELLING CATH    ORGANISM 1                2000 CFU/ML ENTEROBACTER SPECIES   COMMENT                   UNABLE TO FURTHER SPECIATE   COMMENT                   -   ANTIBIOTIC                    ORG#1     CEFAZOLIN                     R         CEFOXITIN                     R         CEFTRIAXONE                   R         CIPROFLOXACIN                 I         ERTAPENEM                     S         GENTAMICIN                    S         IMIPENEM                      S         LEVOFLOXACIN                  I         NITROFURANTOIN                I         TRIMETH/SULFA                 S             Coagulation Studies: No results for input(s): LABPROT, INR in the last  72 hours.  Urinalysis: No results for input(s): COLORURINE, LABSPEC, PHURINE, GLUCOSEU, HGBUR, BILIRUBINUR, KETONESUR, PROTEINUR, UROBILINOGEN, NITRITE, LEUKOCYTESUR in the last 72 hours.  Invalid input(s): APPERANCEUR    Imaging: No results found.   Medications:     Marland Kitchen [  START ON 02/08/2015] amiodarone  200 mg Oral Daily  . amiodarone  400 mg Oral BID  . atorvastatin  40 mg Oral Daily  . budesonide-formoterol  2 puff Inhalation BID  . feeding supplement (NEPRO CARB STEADY)  237 mL Oral BID BM  . insulin aspart  0-9 Units Subcutaneous TID AC & HS  . magnesium oxide  400 mg Oral QHS  . sevelamer carbonate  800 mg Oral TID WC  . sodium chloride  10-40 mL Intracatheter Q12H  . sulfamethoxazole-trimethoprim  2 tablet Oral Q24H  . tiotropium  1 capsule Inhalation Daily   acetaminophen, diphenhydrAMINE, ondansetron (ZOFRAN) IV, phenol, sodium chloride, sodium chloride  Assessment/ Plan:  74 y.o. 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 Hemodialysis  - Continue TTS schedule.   - plan to d/c to [facility] - Patient seen during dialysis. Tolerating well   2. Anemia of chronic kidney disease: D63.1  - holding epo due to prostate cancer.  - transfusion on 5/5 and 5/6   3. Secondary Hyperparathyroidism: N25.81  -start binder , low dose renvela  4. Ileus: K56.7 Continue supportive care. Appreciate surgery input. -  improved  5. A. Fib  with RVR: I48.91  - followed by cardiology    LOS: [place] 5/14/201611:09 AM

## 2015-02-04 NOTE — Progress Notes (Signed)
HD START 

## 2015-02-04 NOTE — Progress Notes (Signed)
PRE HD   

## 2015-02-04 NOTE — Progress Notes (Signed)
EMS has arrived to transfer patient. EMS and RN noted pink tinged urine in foley bag. Dr Posey Pronto paged per EMS request and Dr. Posey Pronto gave go ahead to continue with transfer, attributing urine color to trauma from foley insertion. Pt has left via EMS, [name], cousin of pt, called to notify that EMS was taking pt to [facility].

## 2015-02-04 NOTE — Progress Notes (Signed)
On call MD, Dr. Anselm Jungling paged because one medication has not been reconciled and pt cannot be removed from unit census yet. Pt left the floor at 5:28pm yet still showing in [facility] as in room 237. Dr. Anselm Jungling to reconcile medication.

## 2015-02-04 NOTE — Discharge Instructions (Signed)
Renal diet Resume your out pt Hemodialysis as before Foley care per protocol. Your Foley will be removed after you are seen by Dr Erlene Quan (urology)next week Atrial Fibrillation Atrial fibrillation is a condition that causes your heart to beat irregularly. It may also cause your heart to beat faster than normal. Atrial fibrillation can prevent your heart from pumping blood normally. It increases your risk of stroke and heart problems. HOME CARE  Take medications as told by your doctor.  Only take medications that your doctor says are safe. Some medications can make the condition worse or happen again.  If blood thinners were prescribed by your doctor, take them exactly as told. Too much can cause bleeding. Too little and you will not have the needed protection against stroke and other problems.  Perform blood tests at home if told by your doctor.  Perform blood tests exactly as told by your doctor.  Do not drink alcohol.  Do not drink beverages with caffeine such as coffee, soda, and some teas.  Maintain a healthy weight.  Do not use diet pills unless your doctor says they are safe. They may make heart problems worse.  Follow diet instructions as told by your doctor.  Exercise regularly as told by your doctor.  Keep all follow-up appointments. GET HELP IF:  You notice a change in the speed, rhythm, or strength of your heartbeat.  You suddenly begin peeing (urinating) more often.  You get tired more easily when moving or exercising. GET HELP RIGHT AWAY IF:   You have chest or belly (abdominal) pain.  You feel sick to your stomach (nauseous).  You are short of breath.  You suddenly have swollen feet and ankles.  You feel dizzy.  You face, arms, or legs feel numb or weak.  There is a change in your vision or speech. MAKE SURE YOU:   Understand these instructions.  Will watch your condition.  Will get help right away if you are not doing well or get  worse. Document Released: 06/18/2008 Document Revised: 01/24/2014 Document Reviewed: 10/20/2012 [facility] Patient Information 2015 [place], [place]. This information is not intended to replace advice given to you by your health care provider. Make sure you discuss any questions you have with your health care provider.

## 2015-02-04 NOTE — Progress Notes (Signed)
Report called to Marlaine Hind, RN at [facility]. Pt condition stable and ready for transport.   R IJ removed per protocol, site clean and dry, no bleeding present, pt tolerated well.   Clarified Amio PO prescription with Dr. Posey Pronto, per [facility] RN request. [facility] RN notified that current prescription is correct.

## 2015-02-04 NOTE — Progress Notes (Signed)
HD END 

## 2015-02-04 NOTE — Progress Notes (Signed)
Post hd 

## 2015-02-04 NOTE — Progress Notes (Signed)
MEDICATION RELATED CONSULT NOTE - FOLLOW UP   Pharmacy Consult for Renal dosing in HD Patient Indication: Medication adjustments in HD patient  Allergies  Allergen Reactions  . Penicillins Hives    Patient Measurements: Height: 5' 6.97" (170.1 cm) Weight: 212 lb (96.163 kg) IBW/kg (Calculated) : 66.03   Labs:  Recent Labs  02/02/15 1000 02/03/15 0343 02/04/15 0713  WBC 6.9  --   --   HGB 7.0*  --   --   HCT 21.1*  --   --   PLT 218  --   --   CREATININE 6.51* 3.88* 5.29*  MG 1.8 1.6* 2.0  PHOS 2.6 1.5* 1.9*  ALBUMIN 1.9*  --   --    Estimated Creatinine Clearance: 13.7 mL/min (by C-G formula based on Cr of 5.29).     Medications:  Prescriptions prior to admission  Medication Sig Dispense Refill Last Dose  . amLODipine (NORVASC) 5 MG tablet Take 5 mg by mouth at bedtime.     . budesonide-formoterol (SYMBICORT) 160-4.5 MCG/ACT inhaler Inhale 2 puffs into the lungs 2 (two) times daily.     . chlorpheniramine (CHLOR-TRIMETON) 4 MG tablet Take 4 mg by mouth every 4 (four) hours as needed for allergies.     Marland Kitchen loperamide (IMODIUM) 2 MG capsule Take 2 mg by mouth every 4 (four) hours as needed for diarrhea or loose stools.     . magnesium oxide (MAG-OX) 400 MG tablet Take 400 mg by mouth at bedtime.     . NONFORMULARY OR COMPOUNDED ITEM Take 1 tablet by mouth daily. Rena-Vite Vitamin B Complex with C and Folic Acid     . sevelamer carbonate (RENVELA) 800 MG tablet Take 800 mg by mouth 3 (three) times daily with meals.     . Tiotropium Bromide Monohydrate 2.5 MCG/ACT AERS Inhale 1 puff into the lungs daily.      Scheduled:  . [START ON 02/08/2015] amiodarone  200 mg Oral Daily  . amiodarone  400 mg Oral BID  . atorvastatin  40 mg Oral Daily  . budesonide-formoterol  2 puff Inhalation BID  . feeding supplement (NEPRO CARB STEADY)  237 mL Oral BID BM  . insulin aspart  0-9 Units Subcutaneous TID AC & HS  . magnesium oxide  400 mg Oral QHS  . sevelamer carbonate  800 mg  Oral TID WC  . sodium chloride  10-40 mL Intracatheter Q12H  . sulfamethoxazole-trimethoprim  2 tablet Oral Q24H  . tiotropium  1 capsule Inhalation Daily    Assessment:  Patient with ESRD requiring HD on Tues, Thurs, Sat schedule.  Goal of Therapy:  Adjustment of medications for renal function.  Plan:  No adjustments warranted at this time. Pharmacy will continue to follow.  Rexene Edison, PharmD 02/04/2015 11:59 AM

## 2015-02-04 NOTE — Progress Notes (Signed)
EMS notified of transport request.

## 2015-02-04 NOTE — Progress Notes (Signed)
Pt for d/c to [facility] today via EMS. Pt's cousin, [name], at bedside and requested she be called at home when EMS arrives to transport. RN made aware. Orders faxed and packet complete. RN to call report. CSW signing off. Wandra Feinstein, MSW, LCSW [phone] (weekend coverage)

## 2015-02-04 NOTE — Discharge Summary (Signed)
[place] [facility] [place] NAME: [name]    MR#:  [id number]  DATE OF BIRTH:  03-19-1941  DATE OF ADMISSION:  01/18/2015 ADMITTING PHYSICIAN: Vaughan Basta, MD  DATE OF DISCHARGE: 02/04/2015 PRIMARY CARE PHYSICIAN: Dion Body, MD    ADMISSION DIAGNOSIS:  A FIB RVR HYPOTENSION SEPTIC SHOCK  DISCHARGE DIAGNOSIS:  *New onset Afib with RVR *Ileus resolved. Pt was on TPN *Anemia due to GI bleed (no luminal eval done) *septic shokc due to UTI -* Urinary Retention s/p Foley (to be removed by Urology as out pt) *ESRD on HD *acute on chronic CHf EF 45%  SECONDARY DIAGNOSIS:   Past Medical History  Diagnosis Date  . PAF (paroxysmal atrial fibrillation)     a. new onset 12/2014 in the setting of UTI, sepsis, hypotension, and anemia; b. not on long term anticoagulation given anemia; c. family aware of stroke risk, they are ok with this; d. on amiodarone   . Chronic combined systolic and diastolic CHF (congestive heart failure)     a. echo 12/2014: EF 25-30%, anterior wall wall motion abnormalities; b. planned ischemic evaluation once patient is stable medically; c. echo 01/2015: EF 45-50%, no RWMA, mild AT, LA mildly dilated, mod pericardial effusion along LV free wall, no evidence of hemodynamic compromise  . ESRD on hemodialysis     a. Tuesday, Thursday, and Saturdays  . Anemia     a. baseline hgb ~ 8  . History of small bowel obstruction     a. 01/2015  . History of septic shock     a. 01/2015    HOSPITAL COURSE:   74 m with Bladder/prostate cancer, ESRD on HD, hypertension here with confusion, SOB  * New onset Afib with RVR: due to sepsis, now back in NSR. Pt was placed on IV amio drip. -change to po amiodarone 400 mg bid for 5 days and then 300 mg daily per Dr Fletcher Anon - Holding anticoagulation for now given anemia - No diltiazem given low EF per cardio  * ileus:Resolved - NGT suction since 01/22/15. NGT removed  01/31/15, on soft diet. Tolerating very well -per pt having BM's -on TPN since 01/27/15.--->d/ced 5/1/3/156. Right IJ removed -Xray shows significant improvement  * Septic shock due to UTI  Had ureteral stents- seen by ID, will c/s urology to see if stents will need to be replaced per ID.  BP stable.  on bactrim - last dose on  5/16 per ID. -repeated cx just 2000CFU gr neg rods. -Has ureteral stents and Hx of prostate cancer- may need stent removal and to continue foley  - per urology, advised to follow in office next week for Lupron shot. Dr Erlene Quan recommends to leave foley for now and she will remove as out pt  * GI bleed  Have Occult blood positive in Gastric secretions, and required 2 Units of blood transfusion on last 3 days.  Appreciate GI consult.No luminal evaluation suggested  * ESRD on HD  manage per nephrology  * Anemia of Chronic Disease with worsening due to GI loss Transfused 1 unit PRBC each on 01/26/15 and 01/26/14.  Target Hb should be around 8 per cardiology.   * Pericardial effusion  Cardio on case- moderate Effusion per echo, but no tamponade. 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 EF 45-50% on echo this admission.    Lipitor 40 mg daily  Volume controlled by HD.   *D/c  planning to [facility] today  Overall prognosis is very poor due to multiple medical issues  DISCHARGE CONDITIONS:   fair  CONSULTS OBTAINED:  Treatment Team:  Leonie Man, MD Anthonette Legato, MD Collier Flowers, MD Molly Maduro, MD [place] Jack Phifer, MD Irine Seal, MD Lollie Sails, MD  DRUG ALLERGIES:   Allergies  Allergen Reactions  . Penicillins Hives    DISCHARGE MEDICATIONS:   Current Discharge Medication List    START taking these medications   Details  !! amiodarone (PACERONE) 200 MG tablet Take 1 tablet (200 mg total) by mouth daily. Qty: 30 tablet, Refills: 0    !! amiodarone (PACERONE) 400  MG tablet Take 1 tablet (400 mg total) by mouth 2 (two) times daily. Qty: 10 tablet, Refills: 0    atorvastatin (LIPITOR) 40 MG tablet Take 1 tablet (40 mg total) by mouth daily. Qty: 30 tablet, Refills: 1    Nutritional Supplements (FEEDING SUPPLEMENT, NEPRO CARB STEADY,) LIQD Take 237 mLs by mouth 2 (two) times daily between meals. Qty: 30 Can, Refills: 0    pantoprazole (PROTONIX) 40 MG tablet Take 1 tablet (40 mg total) by mouth daily. Qty: 30 tablet, Refills: 0    sulfamethoxazole-trimethoprim (BACTRIM DS,SEPTRA DS) 800-160 MG per tablet Take 2 tablets by mouth daily. Qty: 6 tablet, Refills: 0     !! - Potential duplicate medications found. Please discuss with provider.    CONTINUE these medications which have NOT CHANGED   Details  budesonide-formoterol (SYMBICORT) 160-4.5 MCG/ACT inhaler Inhale 2 puffs into the lungs 2 (two) times daily.    chlorpheniramine (CHLOR-TRIMETON) 4 MG tablet Take 4 mg by mouth every 4 (four) hours as needed for allergies.    loperamide (IMODIUM) 2 MG capsule Take 2 mg by mouth every 4 (four) hours as needed for diarrhea or loose stools.    magnesium oxide (MAG-OX) 400 MG tablet Take 400 mg by mouth at bedtime.    NONFORMULARY OR COMPOUNDED ITEM Take 1 tablet by mouth daily. Rena-Vite Vitamin B Complex with C and Folic Acid    sevelamer carbonate (RENVELA) 800 MG tablet Take 800 mg by mouth 3 (three) times daily with meals.    Tiotropium Bromide Monohydrate 2.5 MCG/ACT AERS Inhale 1 puff into the lungs daily.      STOP taking these medications     amLODipine (NORVASC) 5 MG tablet         If you experience worsening of your admission symptoms, develop shortness of breath, life threatening emergency, suicidal or homicidal thoughts you must seek medical attention immediately by calling 911 or calling your MD immediately  if symptoms less severe.  You Must read complete instructions/literature along with all the possible adverse reactions/side  effects for all the Medicines you take and that have been prescribed to you. Take any new Medicines after you have completely understood and accept all the possible adverse reactions/side effects.   Please note  You were cared for by a hospitalist during your hospital stay. If you have any questions about your discharge medications or the care you received while you were in the hospital after you are discharged, you can call the unit and asked to speak with the hospitalist on call if the hospitalist that took care of you is not available. Once you are discharged, your primary care physician will handle any further medical issues. Please note that NO REFILLS for any discharge medications will be authorized once you are discharged, as it is imperative that you  return to your primary care physician (or establish a relationship with a primary care physician if you do not have one) for your aftercare needs so that they can reassess your need for medications and monitor your lab values.    Today   SUBJECTIVE   Doing well. Seen at HD   VITAL SIGNS:  Blood pressure 99/60, pulse 82, temperature 97.5 F (36.4 C), temperature source Oral, resp. rate 21, height 5' 6.97" (1.701 m), weight 96.163 kg (212 lb), SpO2 96 %.  I/O:   Intake/Output Summary (Last 24 hours) at 02/04/15 1154 Last data filed at 02/04/15 0747  Gross per 24 hour  Intake      0 ml  Output    650 ml  Net   -650 ml    PHYSICAL EXAMINATION:  GENERAL:  74 y.o.-year-old patient lying in the bed with no acute distress.  EYES: Pupils equal, round, reactive to light and accommodation. No scleral icterus. Extraocular muscles intact.  HEENT: Head atraumatic, normocephalic. Oropharynx and nasopharynx clear.  NECK:  Supple, no jugular venous distention. No thyroid enlargement, no tenderness.  LUNGS: Normal breath sounds bilaterally, no wheezing, rales,rhonchi or crepitation. No use of accessory muscles of respiration.  CARDIOVASCULAR:  S1, S2 normal. No murmurs, rubs, or gallops.  ABDOMEN: Soft, non-tender, non-distended. Bowel sounds present. No organomegaly or mass.  EXTREMITIES: No pedal edema, cyanosis, or clubbing.  NEUROLOGIC: Cranial nerves II through XII are intact. Muscle strength 5/5 in all extremities. Sensation intact. Gait not checked.  PSYCHIATRIC: The patient is alert and oriented x 3.  SKIN: No obvious rash, lesion, or ulcer.   DATA REVIEW:   CBC  Recent Labs Lab 02/02/15 1000  WBC 6.9  HGB 7.0*  HCT 21.1*  PLT 218    Chemistries   Recent Labs Lab 02/04/15 0713  NA 139  K 3.2*  CL 103  CO2 27  GLUCOSE 104*  BUN 40*  CREATININE 5.29*  CALCIUM 7.9*  MG 2.0    Cardiac Enzymes No results for input(s): TROPONINI in the last 168 hours.  Microbiology Results  Results for orders placed or performed during the hospital encounter of 01/18/15  Culture, blood (single)     Status: None (Preliminary result)   Collection Time: 01/18/15  7:11 PM  Result Value Ref Range Status   Micro Text Report   Preliminary       COMMENT                   NO GROWTH IN 66 HOURS   ANTIBIOTIC                                                      Culture, blood (single)     Status: None (Preliminary result)   Collection Time: 01/18/15  7:11 PM  Result Value Ref Range Status   Micro Text Report   Preliminary       COMMENT                   NO GROWTH IN 48 HOURS   ANTIBIOTIC  Urine culture     Status: None   Collection Time: 01/18/15  7:36 PM  Result Value Ref Range Status   Micro Text Report   Final       SOURCE: INDWELLING CATHETER    COMMENT                   NO GROWTH IN 36 HOURS   ANTIBIOTIC                                                      Urine culture     Status: None   Collection Time: 01/20/15  4:09 PM  Result Value Ref Range Status   Micro Text Report   Final       SOURCE: INDWELLING CATH    ORGANISM 1                2000  CFU/ML ENTEROBACTER SPECIES   COMMENT                   UNABLE TO FURTHER SPECIATE   COMMENT                   -   ANTIBIOTIC                    ORG#1     CEFAZOLIN                     R         CEFOXITIN                     R         CEFTRIAXONE                   R         CIPROFLOXACIN                 I         ERTAPENEM                     S         GENTAMICIN                    S         IMIPENEM                      S         LEVOFLOXACIN                  I         NITROFURANTOIN                I         TRIMETH/SULFA                 S             RADIOLOGY:  No results found.  EKG:  No orders found for this or any previous visit.    Management plans discussed with the patient, family and they are in agreement.  CODE STATUS:     Code Status Orders        Start  Ordered   01/22/15 0001  Full code   Continuous     01/21/15 1308      TOTAL TIME TAKING CARE OF THIS PATIENT:69minutes.    Leul Narramore M.D on 02/04/2015 at 11:54 AM  Between 7am to 6pm - Pager - [phone] After 6pm go to [email or web address] - password [email or web address] [facility]  Office  [phone]  CC: Primary care physician; Dion Body, MD

## 2015-02-06 ENCOUNTER — Telehealth: Payer: Self-pay

## 2015-02-06 NOTE — Telephone Encounter (Signed)
Patient contacted regarding discharge from [facility] on 02/04/15.  Patient understands to follow up with provider: Dr. Fletcher Anon on June 6 at 2:30pm at [facility] [place]. Patient understands discharge instructions? yes Patient understands medications and regiment? yes Patient understands to bring all medications to this visit? yes  Pt states he is at [facility] for rehab for 20 days and plans to return home afterwards.

## 2015-02-06 NOTE — Telephone Encounter (Signed)
-----   Message from Arie Sabina sent at 02/06/2015 11:21 AM EDT ----- Regarding: tcm/ph Dr. Fletcher Anon 02/27/15 2:30

## 2015-02-08 ENCOUNTER — Emergency Department
Admission: EM | Admit: 2015-02-08 | Discharge: 2015-02-08 | Disposition: A | Discharge status: Home / Self Care | Payer: Medicare Other | Attending: Emergency Medicine | Admitting: Emergency Medicine

## 2015-02-08 ENCOUNTER — Other Ambulatory Visit: Payer: Self-pay

## 2015-02-08 DIAGNOSIS — Z992 Dependence on renal dialysis: Secondary | ICD-10-CM | POA: Insufficient documentation

## 2015-02-08 DIAGNOSIS — Z87891 Personal history of nicotine dependence: Secondary | ICD-10-CM | POA: Insufficient documentation

## 2015-02-08 DIAGNOSIS — Z79899 Other long term (current) drug therapy: Secondary | ICD-10-CM | POA: Insufficient documentation

## 2015-02-08 DIAGNOSIS — N186 End stage renal disease: Secondary | ICD-10-CM | POA: Diagnosis not present

## 2015-02-08 DIAGNOSIS — D649 Anemia, unspecified: Secondary | ICD-10-CM | POA: Diagnosis not present

## 2015-02-08 DIAGNOSIS — Z880 Allergy status to penicillin: Secondary | ICD-10-CM | POA: Insufficient documentation

## 2015-02-08 DIAGNOSIS — R799 Abnormal finding of blood chemistry, unspecified: Secondary | ICD-10-CM | POA: Diagnosis present

## 2015-02-08 LAB — CBC WITH DIFFERENTIAL/PLATELET
BASOS PCT: 1 %
Basophils Absolute: 0.1 10*3/uL (ref 0–0.1)
Eosinophils Absolute: 0.8 10*3/uL — ABNORMAL HIGH (ref 0–0.7)
Eosinophils Relative: 9 %
HCT: 21 % — ABNORMAL LOW (ref 40.0–52.0)
Hemoglobin: 7.1 g/dL — ABNORMAL LOW (ref 13.0–18.0)
LYMPHS PCT: 18 %
Lymphs Abs: 1.7 10*3/uL (ref 1.0–3.6)
MCH: 29.1 pg (ref 26.0–34.0)
MCHC: 33.8 g/dL (ref 32.0–36.0)
MCV: 86.1 fL (ref 80.0–100.0)
MONOS PCT: 11 %
Monocytes Absolute: 1 10*3/uL (ref 0.2–1.0)
NEUTROS ABS: 5.4 10*3/uL (ref 1.4–6.5)
Neutrophils Relative %: 61 %
Platelets: 257 10*3/uL (ref 150–440)
RBC: 2.43 MIL/uL — ABNORMAL LOW (ref 4.40–5.90)
RDW: 18.5 % — AB (ref 11.5–14.5)
WBC: 9 10*3/uL (ref 3.8–10.6)

## 2015-02-08 LAB — COMPREHENSIVE METABOLIC PANEL
ALBUMIN: 2.5 g/dL — AB (ref 3.5–5.0)
ALK PHOS: 88 U/L (ref 38–126)
ALT: 13 U/L — AB (ref 17–63)
AST: 20 U/L (ref 15–41)
Anion gap: 11 (ref 5–15)
BILIRUBIN TOTAL: 0.5 mg/dL (ref 0.3–1.2)
BUN: 47 mg/dL — ABNORMAL HIGH (ref 6–20)
CALCIUM: 8.2 mg/dL — AB (ref 8.9–10.3)
CO2: 30 mmol/L (ref 22–32)
Chloride: 100 mmol/L — ABNORMAL LOW (ref 101–111)
Creatinine, Ser: 5.71 mg/dL — ABNORMAL HIGH (ref 0.61–1.24)
GFR calc Af Amer: 10 mL/min — ABNORMAL LOW (ref >60)
GFR calc non Af Amer: 9 mL/min — ABNORMAL LOW (ref >60)
GLUCOSE: 95 mg/dL (ref 65–99)
Potassium: 3.9 mmol/L (ref 3.5–5.1)
Sodium: 141 mmol/L (ref 135–145)
Total Protein: 7.2 g/dL (ref 6.5–8.1)

## 2015-02-08 LAB — TYPE AND SCREEN
ABO/RH(D): A NEG
Antibody Screen: NEGATIVE

## 2015-02-08 NOTE — ED Provider Notes (Signed)
[facility] Emergency Department Provider Note  ____________________________________________  Time seen: Approximately 5:15 PM  I have reviewed the triage vital signs and the nursing notes.   HISTORY  Chief Complaint Abnormal Lab    HPI [name] is a 74 y.o. male with a history of anemia and recent admission to the hospital for atrial fibrillation and sepsis who presents today with a hemoglobin in the fives from his SNF.during his admission stated patient was found to have occult positive gastric secretions. The patient is not complaining of any vomiting today or nausea. He denies any blood in the stool. The patient has no complaints at this time.    Past Medical History  Diagnosis Date  . PAF (paroxysmal atrial fibrillation)     a. new onset 12/2014 in the setting of UTI, sepsis, hypotension, and anemia; b. not on long term anticoagulation given anemia; c. family aware of stroke risk, they are ok with this; d. on amiodarone   . Chronic combined systolic and diastolic CHF (congestive heart failure)     a. echo 12/2014: EF 25-30%, anterior wall wall motion abnormalities; b. planned ischemic evaluation once patient is stable medically; c. echo 01/2015: EF 45-50%, no RWMA, mild AT, LA mildly dilated, mod pericardial effusion along LV free wall, no evidence of hemodynamic compromise  . ESRD on hemodialysis     a. Tuesday, Thursday, and Saturdays  . Anemia     a. baseline hgb ~ 8  . History of small bowel obstruction     a. 01/2015  . History of septic shock     a. 01/2015    Patient Active Problem List   Diagnosis Date Noted  . Ileus   . Chronic combined systolic and diastolic CHF (congestive heart failure)   . ESRD on hemodialysis   . Anemia   . History of small bowel obstruction   . Atrial fibrillation with RVR 01/21/2015    Past Surgical History  Procedure Laterality Date  . Cystoscopy w/ ureteral stent placement    . Transurethral resection  of bladder tumor with gyrus (turbt-gyrus)      Current Outpatient Rx  Name  Route  Sig  Dispense  Refill  . amiodarone (PACERONE) 200 MG tablet   Oral   Take 1 tablet (200 mg total) by mouth daily.   30 tablet   0   . amiodarone (PACERONE) 400 MG tablet   Oral   Take 1 tablet (400 mg total) by mouth 2 (two) times daily.   10 tablet   0   . atorvastatin (LIPITOR) 40 MG tablet   Oral   Take 1 tablet (40 mg total) by mouth daily.   30 tablet   1   . budesonide-formoterol (SYMBICORT) 160-4.5 MCG/ACT inhaler   Inhalation   Inhale 2 puffs into the lungs 2 (two) times daily.         . chlorpheniramine (CHLOR-TRIMETON) 4 MG tablet   Oral   Take 4 mg by mouth every 4 (four) hours as needed for allergies.         Marland Kitchen loperamide (IMODIUM) 2 MG capsule   Oral   Take 2 mg by mouth every 4 (four) hours as needed for diarrhea or loose stools.         . magnesium oxide (MAG-OX) 400 MG tablet   Oral   Take 400 mg by mouth at bedtime.         . NONFORMULARY OR COMPOUNDED ITEM  Oral   Take 1 tablet by mouth daily. Rena-Vite Vitamin B Complex with C and Folic Acid         . Nutritional Supplements (FEEDING SUPPLEMENT, NEPRO CARB STEADY,) LIQD   Oral   Take 237 mLs by mouth 2 (two) times daily between meals.   30 Can   0   . pantoprazole (PROTONIX) 40 MG tablet   Oral   Take 1 tablet (40 mg total) by mouth daily.   30 tablet   0   . sevelamer carbonate (RENVELA) 800 MG tablet   Oral   Take 800 mg by mouth 3 (three) times daily with meals.         . Tiotropium Bromide Monohydrate 2.5 MCG/ACT AERS   Inhalation   Inhale 1 puff into the lungs daily.           Allergies Penicillins  No family history on file.  Social History History  Substance Use Topics  . Smoking status: Former Smoker -- 54 years    Quit date: 01/11/2015  . Smokeless tobacco: Not on file  . Alcohol Use: No    Review of Systems Constitutional: No fever/chills Eyes: No visual  changes. ENT: No sore throat. Cardiovascular: Denies chest pain. Respiratory: Denies shortness of breath. Gastrointestinal: No abdominal pain.  No nausea, no vomiting.  No diarrhea.  No constipation. Genitourinary: Negative for dysuria. Musculoskeletal: Negative for back pain. Skin: Negative for rash. Neurological: Negative for headaches, focal weakness or numbness. delete 10-point ROS otherwise negative.  ____________________________________________   PHYSICAL EXAM:  VITAL SIGNS: ED Triage Vitals  Enc Vitals Group     BP --      Pulse Rate 02/08/15 1633 71     Resp 02/08/15 1633 24     Temp 02/08/15 1633 98 F (36.7 C)     Temp Source 02/08/15 1633 Oral     SpO2 02/08/15 1633 100 %     Weight 02/08/15 1633 211 lb (95.709 kg)     Height 02/08/15 1633 6\' 2"  (1.88 m)     Head Cir --      Peak Flow --      Pain Score 02/08/15 1634 0     Pain Loc --      Pain Edu? --      Excl. in Secor? --     Constitutional: Alert and oriented. Well appearing and in no acute distress. Eyes: Conjunctivae are normal. PERRL. EOMI. Head: Atraumatic. Nose: No congestion/rhinnorhea. Mouth/Throat: Mucous membranes are moist.  Oropharynx non-erythematous. Neck: No stridor.  delete deleteCardiovascular: Normal rate, regular rhythm. Grossly normal heart sounds.  Good peripheral circulation. Respiratory: Normal respiratory effort.  No retractions. Lungs CTAB. Gastrointestinal: Soft and nontender. No distention. No abdominal bruits. No CVA tenderness.rectal exam with grossly brown stool which is weakly heme positive. Musculoskeletal: No lower extremity tenderness nor edema.  No joint effusions. Neurologic:  Normal speech and language. No gross focal neurologic deficits are appreciated. Speech is normal. No gait instability. Skin:  Skin is warm, dry and intact. No rash noted. Psychiatric: Mood and affect are normal. Speech and behavior are normal.  ____________________________________________    LABS (all labs ordered are listed, but only abnormal results are displayed)  Labs Reviewed  CBC WITH DIFFERENTIAL/PLATELET - Abnormal; Notable for the following:    RBC 2.43 (*)    Hemoglobin 7.1 (*)    HCT 21.0 (*)    RDW 18.5 (*)    Eosinophils Absolute 0.8 (*)    All  other components within normal limits  COMPREHENSIVE METABOLIC PANEL - Abnormal; Notable for the following:    Chloride 100 (*)    BUN 47 (*)    Creatinine, Ser 5.71 (*)    Calcium 8.2 (*)    Albumin 2.5 (*)    ALT 13 (*)    GFR calc non Af Amer 9 (*)    GFR calc Af Amer 10 (*)    All other components within normal limits  TYPE AND SCREEN   ____________________________________________  EKG  ED ECG REPORT   Date: 02/08/2015  EKG Time: 1629  Rate: 70  Rhythm: normal EKG, normal sinus rhythm, unchanged from previous tracings, normal sinus rhythm  Axis: normal axis  Intervals:Prolonged QT  ST&T Change: Biphasic T waves diffusely which is unchanged from previous EKG earlier in May. There are no ST elevations or depressions.  ____________________________________________  RADIOLOGY   ____________________________________________   PROCEDURES    ____________________________________________   INITIAL IMPRESSION / ASSESSMENT AND PLAN / ED COURSE  Pertinent labs & imaging results that were available during my care of the patient were reviewed by me and considered in my medical decision making (see chart for details).  Patient is without complaints with a stable hemoglobin from previous 6 days ago. At [facility]. Furthermore the patient is asymptomatic. I will discharge him to home. We'll recommend further trending of hemoglobin at his SNF. Patient appears to be chronically anemic and not as significantly lower level than previously. Unclear why the patient had a critical hemoglobin of 5.8 on his previous lab draw. Does not look overtly dilutional. Possibly lab  error. ____________________________________________   FINAL CLINICAL IMPRESSION(S) / ED DIAGNOSES  Chronic anemia. Return visit.    Orbie Pyo, MD 02/08/15 [phone]

## 2015-02-08 NOTE — ED Notes (Signed)
Pt discharged home after verbalizing understanding of discharge instructions; nad noted. 

## 2015-02-08 NOTE — Discharge Instructions (Signed)
Anemia, Nonspecific Anemia is a condition in which the concentration of red blood cells or hemoglobin in the blood is below normal. Hemoglobin is a substance in red blood cells that carries oxygen to the tissues of the body. Anemia results in not enough oxygen reaching these tissues.  CAUSES  Common causes of anemia include:   Excessive bleeding. Bleeding may be internal or external. This includes excessive bleeding from periods (in women) or from the intestine.   Poor nutrition.   Chronic kidney, thyroid, and liver disease.  Bone marrow disorders that decrease red blood cell production.  Cancer and treatments for cancer.  HIV, AIDS, and their treatments.  Spleen problems that increase red blood cell destruction.  Blood disorders.  Excess destruction of red blood cells due to infection, medicines, and autoimmune disorders. SIGNS AND SYMPTOMS   Minor weakness.   Dizziness.   Headache.  Palpitations.   Shortness of breath, especially with exercise.   Paleness.  Cold sensitivity.  Indigestion.  Nausea.  Difficulty sleeping.  Difficulty concentrating. Symptoms may occur suddenly or they may develop slowly.  DIAGNOSIS  Additional blood tests are often needed. These help your health care provider determine the best treatment. Your health care provider will check your stool for blood and look for other causes of blood loss.  TREATMENT  Treatment varies depending on the cause of the anemia. Treatment can include:   Supplements of iron, vitamin B12, or folic acid.   Hormone medicines.   A blood transfusion. This may be needed if blood loss is severe.   Hospitalization. This may be needed if there is significant continual blood loss.   Dietary changes.  Spleen removal. HOME CARE INSTRUCTIONS Keep all follow-up appointments. It often takes many weeks to correct anemia, and having your health care provider check on your condition and your response to  treatment is very important. SEEK IMMEDIATE MEDICAL CARE IF:   You develop extreme weakness, shortness of breath, or chest pain.   You become dizzy or have trouble concentrating.  You develop heavy vaginal bleeding.   You develop a rash.   You have bloody or black, tarry stools.   You faint.   You vomit up blood.   You vomit repeatedly.   You have abdominal pain.  You have a fever or persistent symptoms for more than 2-3 days.   You have a fever and your symptoms suddenly get worse.   You are dehydrated.  MAKE SURE YOU:  Understand these instructions.  Will watch your condition.  Will get help right away if you are not doing well or get worse. Document Released: 10/17/2004 Document Revised: 05/12/2013 Document Reviewed: 03/05/2013 ExitCare Patient Information 2015 [employer]. This information is not intended to replace advice given to you by your health care provider. Make sure you discuss any questions you have with your health care provider.  

## 2015-02-08 NOTE — ED Notes (Signed)
Pt states he has been feeling fine but was told that his hemoglobin was low and he was transported via ems from [facility] "for transfusion."

## 2015-02-13 ENCOUNTER — Encounter: Payer: Self-pay | Admitting: Emergency Medicine

## 2015-02-13 ENCOUNTER — Other Ambulatory Visit
Admission: RE | Admit: 2015-02-13 | Discharge: 2015-02-13 | Disposition: A | Discharge status: Home / Self Care | Payer: Medicare Other | Source: Skilled Nursing Facility | Attending: Family Medicine | Admitting: Family Medicine

## 2015-02-13 ENCOUNTER — Emergency Department: Payer: Medicare Other

## 2015-02-13 ENCOUNTER — Inpatient Hospital Stay
Admission: EM | Admit: 2015-02-13 | Discharge: 2015-02-17 | DRG: 377 | Disposition: A | Discharge status: Skilled Nursing Facility | Payer: Medicare Other | Attending: Internal Medicine | Admitting: Internal Medicine

## 2015-02-13 DIAGNOSIS — Z8546 Personal history of malignant neoplasm of prostate: Secondary | ICD-10-CM | POA: Diagnosis not present

## 2015-02-13 DIAGNOSIS — Z992 Dependence on renal dialysis: Secondary | ICD-10-CM | POA: Diagnosis not present

## 2015-02-13 DIAGNOSIS — Z8551 Personal history of malignant neoplasm of bladder: Secondary | ICD-10-CM | POA: Diagnosis not present

## 2015-02-13 DIAGNOSIS — K219 Gastro-esophageal reflux disease without esophagitis: Secondary | ICD-10-CM | POA: Diagnosis present

## 2015-02-13 DIAGNOSIS — D500 Iron deficiency anemia secondary to blood loss (chronic): Secondary | ICD-10-CM | POA: Diagnosis present

## 2015-02-13 DIAGNOSIS — F0390 Unspecified dementia without behavioral disturbance: Secondary | ICD-10-CM | POA: Diagnosis present

## 2015-02-13 DIAGNOSIS — J9610 Chronic respiratory failure, unspecified whether with hypoxia or hypercapnia: Secondary | ICD-10-CM | POA: Diagnosis present

## 2015-02-13 DIAGNOSIS — I5042 Chronic combined systolic (congestive) and diastolic (congestive) heart failure: Secondary | ICD-10-CM | POA: Diagnosis present

## 2015-02-13 DIAGNOSIS — N139 Obstructive and reflux uropathy, unspecified: Secondary | ICD-10-CM | POA: Diagnosis present

## 2015-02-13 DIAGNOSIS — K922 Gastrointestinal hemorrhage, unspecified: Principal | ICD-10-CM | POA: Diagnosis present

## 2015-02-13 DIAGNOSIS — I480 Paroxysmal atrial fibrillation: Secondary | ICD-10-CM | POA: Diagnosis present

## 2015-02-13 DIAGNOSIS — D62 Acute posthemorrhagic anemia: Secondary | ICD-10-CM | POA: Diagnosis present

## 2015-02-13 DIAGNOSIS — Z79899 Other long term (current) drug therapy: Secondary | ICD-10-CM

## 2015-02-13 DIAGNOSIS — Z880 Allergy status to penicillin: Secondary | ICD-10-CM

## 2015-02-13 DIAGNOSIS — J449 Chronic obstructive pulmonary disease, unspecified: Secondary | ICD-10-CM | POA: Diagnosis present

## 2015-02-13 DIAGNOSIS — N186 End stage renal disease: Secondary | ICD-10-CM | POA: Diagnosis present

## 2015-02-13 DIAGNOSIS — D631 Anemia in chronic kidney disease: Secondary | ICD-10-CM | POA: Diagnosis present

## 2015-02-13 DIAGNOSIS — D649 Anemia, unspecified: Secondary | ICD-10-CM | POA: Diagnosis present

## 2015-02-13 DIAGNOSIS — F17210 Nicotine dependence, cigarettes, uncomplicated: Secondary | ICD-10-CM | POA: Diagnosis present

## 2015-02-13 DIAGNOSIS — Z9981 Dependence on supplemental oxygen: Secondary | ICD-10-CM | POA: Diagnosis not present

## 2015-02-13 DIAGNOSIS — N2581 Secondary hyperparathyroidism of renal origin: Secondary | ICD-10-CM | POA: Diagnosis present

## 2015-02-13 DIAGNOSIS — I120 Hypertensive chronic kidney disease with stage 5 chronic kidney disease or end stage renal disease: Secondary | ICD-10-CM | POA: Diagnosis present

## 2015-02-13 HISTORY — DX: Pure hypercholesterolemia, unspecified: E78.00

## 2015-02-13 HISTORY — DX: Magnesium deficiency: E61.2

## 2015-02-13 HISTORY — DX: Unspecified dementia, unspecified severity, without behavioral disturbance, psychotic disturbance, mood disturbance, and anxiety: F03.90

## 2015-02-13 HISTORY — DX: Cognitive communication deficit: R41.841

## 2015-02-13 HISTORY — DX: Malignant neoplasm of bladder, unspecified: C67.9

## 2015-02-13 HISTORY — DX: Chronic obstructive pulmonary disease, unspecified: J44.9

## 2015-02-13 HISTORY — DX: Malignant neoplasm of prostate: C61

## 2015-02-13 HISTORY — DX: Gastro-esophageal reflux disease without esophagitis: K21.9

## 2015-02-13 HISTORY — DX: Allergic rhinitis, unspecified: J30.9

## 2015-02-13 LAB — CBC WITH DIFFERENTIAL/PLATELET
Basophils Absolute: 0 10*3/uL (ref 0–0.1)
Basophils Relative: 1 %
Eosinophils Absolute: 0.6 10*3/uL (ref 0–0.7)
Eosinophils Relative: 7 %
HCT: 17.9 % — ABNORMAL LOW (ref 40.0–52.0)
HEMOGLOBIN: 5.8 g/dL — AB (ref 13.0–18.0)
Lymphocytes Relative: 14 %
Lymphs Abs: 1.2 10*3/uL (ref 1.0–3.6)
MCH: 28.4 pg (ref 26.0–34.0)
MCHC: 32.6 g/dL (ref 32.0–36.0)
MCV: 87.2 fL (ref 80.0–100.0)
MONO ABS: 0.8 10*3/uL (ref 0.2–1.0)
Monocytes Relative: 10 %
NEUTROS ABS: 5.6 10*3/uL (ref 1.4–6.5)
Neutrophils Relative %: 68 %
Platelets: 277 10*3/uL (ref 150–440)
RBC: 2.05 MIL/uL — ABNORMAL LOW (ref 4.40–5.90)
RDW: 19.1 % — AB (ref 11.5–14.5)
WBC: 8.2 10*3/uL (ref 3.8–10.6)

## 2015-02-13 LAB — CBC
HCT: 18.1 % — ABNORMAL LOW (ref 40.0–52.0)
HEMOGLOBIN: 5.9 g/dL — AB (ref 13.0–18.0)
MCH: 28.3 pg (ref 26.0–34.0)
MCHC: 32.3 g/dL (ref 32.0–36.0)
MCV: 87.5 fL (ref 80.0–100.0)
Platelets: 277 10*3/uL (ref 150–440)
RBC: 2.07 MIL/uL — ABNORMAL LOW (ref 4.40–5.90)
RDW: 18.7 % — ABNORMAL HIGH (ref 11.5–14.5)
WBC: 7.8 10*3/uL (ref 3.8–10.6)

## 2015-02-13 LAB — ABO/RH: ABO/RH(D): A NEG

## 2015-02-13 LAB — PREPARE RBC (CROSSMATCH)

## 2015-02-13 MED ORDER — ONDANSETRON HCL 4 MG/2ML IJ SOLN: 4.0000 mg | Freq: Four times a day (QID) | INTRAMUSCULAR | Status: DC | PRN

## 2015-02-13 MED ORDER — AMLODIPINE BESYLATE 5 MG PO TABS
5.0000 mg | ORAL_TABLET | Freq: Every day | ORAL | Status: DC
  Administered 2015-02-17: 5 mg via ORAL
  Filled 2015-02-13 (×2): qty 1

## 2015-02-13 MED ORDER — ONDANSETRON HCL 4 MG PO TABS: 4.0000 mg | ORAL_TABLET | Freq: Four times a day (QID) | ORAL | Status: DC | PRN

## 2015-02-13 MED ORDER — BUDESONIDE-FORMOTEROL FUMARATE 160-4.5 MCG/ACT IN AERO
2.0000 | INHALATION_SPRAY | Freq: Two times a day (BID) | RESPIRATORY_TRACT | Status: DC
  Administered 2015-02-14 – 2015-02-17 (×7): 2 via RESPIRATORY_TRACT
  Filled 2015-02-13: qty 6

## 2015-02-13 MED ORDER — SODIUM CHLORIDE 0.9 % IJ SOLN
3.0000 mL | Freq: Two times a day (BID) | INTRAMUSCULAR | Status: DC
  Administered 2015-02-14 – 2015-02-17 (×7): 3 mL via INTRAVENOUS

## 2015-02-13 MED ORDER — PANTOPRAZOLE SODIUM 40 MG PO TBEC: 40.0000 mg | DELAYED_RELEASE_TABLET | Freq: Every day | ORAL | Status: DC

## 2015-02-13 MED ORDER — AMIODARONE HCL 200 MG PO TABS
200.0000 mg | ORAL_TABLET | Freq: Every day | ORAL | Status: DC
  Administered 2015-02-14 – 2015-02-17 (×2): 200 mg via ORAL
  Filled 2015-02-13 (×3): qty 1

## 2015-02-13 MED ORDER — FUROSEMIDE 10 MG/ML IJ SOLN
40.0000 mg | Freq: Once | INTRAMUSCULAR | Status: AC
  Administered 2015-02-13: 40 mg via INTRAVENOUS

## 2015-02-13 MED ORDER — SODIUM CHLORIDE 0.9 % IV SOLN: 10.0000 mL/h | Freq: Once | INTRAVENOUS | Status: DC

## 2015-02-13 MED ORDER — TIOTROPIUM BROMIDE MONOHYDRATE 2.5 MCG/ACT IN AERS
1.0000 | INHALATION_SPRAY | Freq: Every day | RESPIRATORY_TRACT | Status: DC
  Filled 2015-02-13: qty 1

## 2015-02-13 MED ORDER — ACETAMINOPHEN 325 MG PO TABS: 650.0000 mg | ORAL_TABLET | Freq: Four times a day (QID) | ORAL | Status: DC | PRN

## 2015-02-13 MED ORDER — ATORVASTATIN CALCIUM 20 MG PO TABS
40.0000 mg | ORAL_TABLET | Freq: Every day | ORAL | Status: DC
  Administered 2015-02-17: 40 mg via ORAL
  Filled 2015-02-13: qty 2

## 2015-02-13 MED ORDER — ACETAMINOPHEN 650 MG RE SUPP: 650.0000 mg | Freq: Four times a day (QID) | RECTAL | Status: DC | PRN

## 2015-02-13 MED ORDER — FUROSEMIDE 10 MG/ML IJ SOLN
INTRAMUSCULAR | Status: AC
  Administered 2015-02-13: 40 mg via INTRAVENOUS
  Filled 2015-02-13: qty 4

## 2015-02-13 MED ORDER — ALBUTEROL SULFATE (2.5 MG/3ML) 0.083% IN NEBU: 2.5000 mg | INHALATION_SOLUTION | RESPIRATORY_TRACT | Status: DC | PRN

## 2015-02-13 MED ORDER — RENA-VITE PO TABS
1.0000 | ORAL_TABLET | Freq: Every day | ORAL | Status: DC
  Administered 2015-02-17: 1 via ORAL
  Filled 2015-02-13 (×4): qty 1

## 2015-02-13 MED ORDER — SEVELAMER CARBONATE 800 MG PO TABS
800.0000 mg | ORAL_TABLET | Freq: Three times a day (TID) | ORAL | Status: DC
  Administered 2015-02-15 – 2015-02-17 (×6): 800 mg via ORAL
  Filled 2015-02-13 (×7): qty 1

## 2015-02-13 MED ORDER — ALBUTEROL SULFATE HFA 108 (90 BASE) MCG/ACT IN AERS: 2.0000 | INHALATION_SPRAY | RESPIRATORY_TRACT | Status: DC | PRN

## 2015-02-13 MED ORDER — HYDROCODONE-ACETAMINOPHEN 5-325 MG PO TABS: 1.0000 | ORAL_TABLET | ORAL | Status: DC | PRN

## 2015-02-13 MED ORDER — NEPRO/CARBSTEADY PO LIQD: 237.0000 mL | Freq: Two times a day (BID) | ORAL | Status: DC

## 2015-02-13 MED ORDER — MAGNESIUM OXIDE 400 MG PO TABS
400.0000 mg | ORAL_TABLET | Freq: Every day | ORAL | Status: DC
  Administered 2015-02-15 – 2015-02-16 (×2): 400 mg via ORAL
  Filled 2015-02-13 (×7): qty 1

## 2015-02-13 NOTE — ED Notes (Signed)
Pt phone and clothes in bed with patient.

## 2015-02-13 NOTE — H&P (Addendum)
[place] [facility] [place] NAME: [name]    MR#:  [id number]  DATE OF BIRTH:  April 04, 1941  DATE OF ADMISSION:  02/13/2015  PRIMARY CARE PHYSICIAN: Alvester Morin, MD   REQUESTING/REFERRING PHYSICIAN: Harvest Dark MD  CHIEF COMPLAINT:   Chief Complaint  Patient presents with  . Anemia    HISTORY OF PRESENT ILLNESS:  [name]  is a 74 y.o. male with a known history of paroxysmal atrial fibrillation, chronic anemia, ESRD on hemodialysis, chronic Foley catheter presents to the emergency room sent in from [facility] after being found to have hemoglobin of 5.9. Patient was recently in the hospital for septic shock with Enterobacter UTI. Was found to have worsening anemia with heme positive stool was transfused 2 units of packed RBC and discharged after luminal evaluation was deferred by GI. His bleeding was thought to be chronic from diverticulosis. No anticoagulation was started due to blood in stool. Patient doesn't have any concerns. No nausea or vomiting. No abdominal pain. He has not noticed any frank blood in his stool but was positive for blood when checked in the emergency room. He is not on any anticoagulation, no iron supplements. Patient is not aware of his last colonoscopy. 2 units of packed red blood cells have been ordered in the emergency room. Patient's last dialysis was on Saturday.  PAST MEDICAL HISTORY:   Past Medical History  Diagnosis Date  . PAF (paroxysmal atrial fibrillation)     a. new onset 12/2014 in the setting of UTI, sepsis, hypotension, and anemia; b. not on long term anticoagulation given anemia; c. family aware of stroke risk, they are ok with this; d. on amiodarone   . Chronic combined systolic and diastolic CHF (congestive heart failure)     a. echo 12/2014: EF 25-30%, anterior wall wall motion abnormalities; b. planned ischemic evaluation once patient is stable medically; c. echo  01/2015: EF 45-50%, no RWMA, mild AT, LA mildly dilated, mod pericardial effusion along LV free wall, no evidence of hemodynamic compromise  . ESRD on hemodialysis     a. Tuesday, Thursday, and Saturdays  . Anemia     a. baseline hgb ~ 8  . History of small bowel obstruction     a. 01/2015  . History of septic shock     a. 01/2015  . GERD (gastroesophageal reflux disease)   . Cancer     Prostate, Bladder  . Allergic rhinitis   . COPD (chronic obstructive pulmonary disease)   . Hypercholesteremia   . Cognitive communication deficit   . Magnesium deficiency   . Dementia   . Bladder cancer   . Prostate cancer   . Chronic indwelling Foley catheter     PAST SURGICAL HISTORY:   Past Surgical History  Procedure Laterality Date  . Cystoscopy w/ ureteral stent placement    . Transurethral resection of bladder tumor with gyrus (turbt-gyrus)    . Ureteral stent placement      SOCIAL HISTORY:   History  Substance Use Topics  . Smoking status: Former Smoker -- 36 years    Quit date: 01/11/2015  . Smokeless tobacco: Never Used  . Alcohol Use: No    FAMILY HISTORY:   Family History  Problem Relation Age of Onset  . Family history unknown: Yes    DRUG ALLERGIES:   Allergies  Allergen Reactions  . Penicillins Hives    REVIEW OF SYSTEMS:   Review of Systems  Constitutional: Positive for malaise/fatigue. Negative for fever, chills and weight loss.  HENT: Negative for hearing loss and nosebleeds.   Eyes: Negative for blurred vision, double vision and pain.  Respiratory: Negative for cough, hemoptysis, sputum production, shortness of breath and wheezing.   Cardiovascular: Negative for chest pain, palpitations, orthopnea and leg swelling.  Gastrointestinal: Positive for blood in stool. Negative for nausea, vomiting, abdominal pain, diarrhea and constipation.  Genitourinary: Negative for dysuria and hematuria.  Musculoskeletal: Negative for myalgias, back pain and falls.   Skin: Negative for rash.  Neurological: Negative for dizziness, tremors, sensory change, speech change, focal weakness, seizures and headaches.  Endo/Heme/Allergies: Does not bruise/bleed easily.  Psychiatric/Behavioral: Negative for depression and memory loss. The patient is not nervous/anxious.     MEDICATIONS AT HOME:   Prior to Admission medications   Medication Sig Start Date End Date Taking? Authorizing Provider  albuterol (PROVENTIL HFA;VENTOLIN HFA) 108 (90 BASE) MCG/ACT inhaler Inhale 2 puffs into the lungs every 4 (four) hours as needed for wheezing or shortness of breath (Every 4 to 6 hours).   Yes Historical Provider, MD  albuterol (PROVENTIL) (2.5 MG/3ML) 0.083% nebulizer solution Take 2.5 mg by nebulization every 6 (six) hours as needed for wheezing or shortness of breath.   Yes Historical Provider, MD  amiodarone (PACERONE) 200 MG tablet Take 1 tablet (200 mg total) by mouth daily. 02/08/15  Yes Fritzi Mandes, MD  amLODipine (NORVASC) 5 MG tablet Take 5 mg by mouth daily.   Yes Historical Provider, MD  atorvastatin (LIPITOR) 40 MG tablet Take 1 tablet (40 mg total) by mouth daily. 02/03/15  Yes Fritzi Mandes, MD  budesonide-formoterol (SYMBICORT) 160-4.5 MCG/ACT inhaler Inhale 2 puffs into the lungs 2 (two) times daily.   Yes Historical Provider, MD  chlorpheniramine (CHLOR-TRIMETON) 4 MG tablet Take 4 mg by mouth every 4 (four) hours as needed for allergies.   Yes Historical Provider, MD  guaifenesin (ROBITUSSIN) 100 MG/5ML syrup Take 200 mg by mouth 3 (three) times daily as needed for cough.   Yes Historical Provider, MD  loperamide (IMODIUM A-D) 2 MG tablet Take 2 mg by mouth every 4 (four) hours as needed for diarrhea or loose stools.   Yes Historical Provider, MD  magnesium oxide (MAG-OX) 400 MG tablet Take 400 mg by mouth at bedtime.   Yes Historical Provider, MD  multivitamin (RENA-VIT) TABS tablet Take 1 tablet by mouth daily.   Yes Historical Provider, MD  Nutritional  Supplements (FEEDING SUPPLEMENT, NEPRO CARB STEADY,) LIQD Take 237 mLs by mouth 2 (two) times daily between meals. 02/03/15  Yes Fritzi Mandes, MD  pantoprazole (PROTONIX) 40 MG tablet Take 1 tablet (40 mg total) by mouth daily. 02/03/15  Yes Fritzi Mandes, MD  sevelamer carbonate (RENVELA) 800 MG tablet Take 800 mg by mouth 3 (three) times daily with meals.    Yes Historical Provider, MD  Tiotropium Bromide Monohydrate 2.5 MCG/ACT AERS Inhale 1 puff into the lungs daily.   Yes Historical Provider, MD  amiodarone (PACERONE) 400 MG tablet Take 1 tablet (400 mg total) by mouth 2 (two) times daily. Patient not taking: Reported on 02/13/2015 02/03/15   Fritzi Mandes, MD      VITAL SIGNS:  Blood pressure 127/78, pulse 78, temperature 97.8 F (36.6 C), temperature source Oral, resp. rate 16, height 6\' 2"  (1.88 m), weight 95.255 kg (210 lb), SpO2 96 %.  PHYSICAL EXAMINATION:  Physical Exam  GENERAL:  74 y.o.-year-old patient lying in the bed with no acute distress.  EYES: Pupils equal, round, reactive to light and accommodation. No scleral icterus. Extraocular muscles intact.  HEENT: Head atraumatic, normocephalic. Oropharynx and nasopharynx clear. No oropharyngeal erythema, moist oral mucosa. Pallor positive. NECK:  Supple, no jugular venous distention. No thyroid enlargement, no tenderness.  LUNGS: Normal breath sounds bilaterally, no wheezing, rales, rhonchi. No use of accessory muscles of respiration.  CARDIOVASCULAR: S1, S2 normal. No murmurs, rubs, or gallops.  ABDOMEN: Soft, nontender, nondistended. Bowel sounds present. No organomegaly or mass.  EXTREMITIES: No pedal edema, cyanosis, or clubbing. + 2 pedal & radial pulses b/l.   NEUROLOGIC: Cranial nerves II through XII are intact. No focal Motor or sensory deficits appreciated b/l PSYCHIATRIC: The patient is alert and oriented x 3. Good affect.  SKIN: No obvious rash, lesion, or ulcer.   LABORATORY PANEL:   CBC  Recent Labs Lab 02/13/15 1632   WBC 7.8  HGB 5.9*  HCT 18.1*  PLT 277   ------------------------------------------------------------------------------------------------------------------  Chemistries   Recent Labs Lab 02/08/15 1650  NA 141  K 3.9  CL 100*  CO2 30  GLUCOSE 95  BUN 47*  CREATININE 5.71*  CALCIUM 8.2*  AST 20  ALT 13*  ALKPHOS 88  BILITOT 0.5   ------------------------------------------------------------------------------------------------------------------  Cardiac Enzymes No results for input(s): TROPONINI in the last 168 hours. ------------------------------------------------------------------------------------------------------------------  RADIOLOGY:  Dg Chest 2 View  02/13/2015   CLINICAL DATA:  Patient has had a dry cough for two days, he is hoarse when he speaks. Hx PAF, cancer, ESRD, GERD, COPD, CHF  EXAM: CHEST  2 VIEW  COMPARISON:  01/30/2015  FINDINGS: There has been improvement since the prior study. There are now only small pleural effusions there is mild dependent atelectasis. No convincing pulmonary edema. Lungs remain mildly hyperexpanded. There is mild enlargement of the cardiopericardial silhouette. Aorta is uncoiled. No mediastinal or hilar masses or evidence of adenopathy.  No pneumothorax.  IMPRESSION: 1. No acute cardiopulmonary disease. 2. Interval improvement from the prior study with decreased pleural fluid, most evident on the left, with only small residual pleural effusions with mild associated dependent atelectasis. No pulmonary edema.   Electronically Signed   By: Lajean Manes M.D.   On: 02/13/2015 19:18     IMPRESSION AND PLAN:   1. Acute on chronic anemia due to GI blood loss. Patient likely has slow chronic blood loss. Likely diverticular bleed. With hemoglobin of 5.9 with transfused 2 units of packed RBC. Also give him a dose of IV Lasix 40 mg. Check repeat hemoglobin up after blood transfusion. We will consult GI for luminal evaluation. Iron supplementation  at discharge. Patient will be kept nothing by mouth. Will need a bleeding scan if any acute worsening.  2. End-stage renal disease on hemodialysis with Tuesday, Thursday, Saturday schedule. Consult nephrology for dialysis needs.  3. Hypertension. Continue home medications.  4. Paroxysmal atrial fibrillation. Continue amiodarone. No anticoagulation. Patient is on off unit telemetry.  5. Chronic Foley catheter with history of bladder and prostate cancer. Patient has appointment with urology on 02/15/2015.  6. DVT prophylaxis - SCDs    All the records are reviewed and case discussed with ED provider. Management plans discussed with the patient, family and they are in agreement.  CODE STATUS: FULL CODE  TOTAL TIME TAKING CARE OF THIS PATIENT: 40 minutes.    Hillary Bow R M.D on 02/13/2015 at 8:26 PM  Between 7am to 6pm - Pager - [phone]  After 6pm go to [email or web address] - password [email or web address] [facility]  [facility]  Office  [phone]  CC: Primary care physician; Alvester Morin, MD

## 2015-02-13 NOTE — ED Provider Notes (Signed)
[facility] Emergency Department Provider Note  Time seen: 5:31 PM  I have reviewed the triage vital signs and the nursing notes.   HISTORY  Chief Complaint Anemia    HPI [name] is a 74 y.o. male with a past medical history approximately several atrial fibrillation, CHF, end-stage renal disease on dialysis, chronic anemia, who was recently admitted to the hospital for a GI bleed and confusion. Patient required 2 units of blood approximately1-2 weeks ago. Patient has been receiving blood work at his nursing facility and today his blood levels noted to be a 5.3 hemoglobin so they sent him to the ER for further evaluation. Patient denies any abdominal pain, black or bloody stool. Patient likely with dementia as he does not recall receiving blood products in the past however review of records shows he received 2 units of transfused blood approximately 2 weeks ago. Denies any vomiting. He does state a cough for approximately one week. Denies any weakness, shortness of breath, chest pain.    Past Medical History  Diagnosis Date  . PAF (paroxysmal atrial fibrillation)     a. new onset 12/2014 in the setting of UTI, sepsis, hypotension, and anemia; b. not on long term anticoagulation given anemia; c. family aware of stroke risk, they are ok with this; d. on amiodarone   . Chronic combined systolic and diastolic CHF (congestive heart failure)     a. echo 12/2014: EF 25-30%, anterior wall wall motion abnormalities; b. planned ischemic evaluation once patient is stable medically; c. echo 01/2015: EF 45-50%, no RWMA, mild AT, LA mildly dilated, mod pericardial effusion along LV free wall, no evidence of hemodynamic compromise  . ESRD on hemodialysis     a. Tuesday, Thursday, and Saturdays  . Anemia     a. baseline hgb ~ 8  . History of small bowel obstruction     a. 01/2015  . History of septic shock     a. 01/2015  . GERD (gastroesophageal reflux disease)   .  Cancer     Prostate, Bladder  . Allergic rhinitis   . COPD (chronic obstructive pulmonary disease)   . Hypercholesteremia   . Cognitive communication deficit   . Magnesium deficiency     Patient Active Problem List   Diagnosis Date Noted  . Ileus   . Chronic combined systolic and diastolic CHF (congestive heart failure)   . ESRD on hemodialysis   . Anemia   . History of small bowel obstruction   . Atrial fibrillation with RVR 01/21/2015    Past Surgical History  Procedure Laterality Date  . Cystoscopy w/ ureteral stent placement    . Transurethral resection of bladder tumor with gyrus (turbt-gyrus)      Current Outpatient Rx  Name  Route  Sig  Dispense  Refill  . amiodarone (PACERONE) 200 MG tablet   Oral   Take 1 tablet (200 mg total) by mouth daily.   30 tablet   0   . amiodarone (PACERONE) 400 MG tablet   Oral   Take 1 tablet (400 mg total) by mouth 2 (two) times daily.   10 tablet   0   . atorvastatin (LIPITOR) 40 MG tablet   Oral   Take 1 tablet (40 mg total) by mouth daily.   30 tablet   1   . budesonide-formoterol (SYMBICORT) 160-4.5 MCG/ACT inhaler   Inhalation   Inhale 2 puffs into the lungs 2 (two) times daily.         Marland Kitchen  chlorpheniramine (CHLOR-TRIMETON) 4 MG tablet   Oral   Take 4 mg by mouth every 4 (four) hours as needed for allergies.         Marland Kitchen loperamide (IMODIUM) 2 MG capsule   Oral   Take 2 mg by mouth every 4 (four) hours as needed for diarrhea or loose stools.         . magnesium oxide (MAG-OX) 400 MG tablet   Oral   Take 400 mg by mouth at bedtime.         . NONFORMULARY OR COMPOUNDED ITEM   Oral   Take 1 tablet by mouth daily. Rena-Vite Vitamin B Complex with C and Folic Acid         . Nutritional Supplements (FEEDING SUPPLEMENT, NEPRO CARB STEADY,) LIQD   Oral   Take 237 mLs by mouth 2 (two) times daily between meals.   30 Can   0   . pantoprazole (PROTONIX) 40 MG tablet   Oral   Take 1 tablet (40 mg total) by  mouth daily.   30 tablet   0   . sevelamer carbonate (RENVELA) 800 MG tablet   Oral   Take 800 mg by mouth 3 (three) times daily with meals.         . Tiotropium Bromide Monohydrate 2.5 MCG/ACT AERS   Inhalation   Inhale 1 puff into the lungs daily.           Allergies Penicillins  History reviewed. No pertinent family history.  Social History History  Substance Use Topics  . Smoking status: Former Smoker -- 43 years    Quit date: 01/11/2015  . Smokeless tobacco: Never Used  . Alcohol Use: No    Review of Systems Constitutional: Negative for fever. Negative for generalized weakness. ENT: Negative for congestion Cardiovascular: Negative for chest pain. Respiratory: Negative for shortness of breath. Gastrointestinal: Negative for abdominal pain, dark stool or bloody stool. Genitourinary: Negative for hematuria. Neurological: Negative for headaches, focal weakness or numbness.  10-point ROS otherwise negative.  ____________________________________________   PHYSICAL EXAM:  VITAL SIGNS: ED Triage Vitals  Enc Vitals Group     BP 02/13/15 1643 127/78 mmHg     Pulse Rate 02/13/15 1643 78     Resp 02/13/15 1643 16     Temp 02/13/15 1643 97.8 F (36.6 C)     Temp Source 02/13/15 1643 Oral     SpO2 02/13/15 1643 96 %     Weight 02/13/15 1643 210 lb (95.255 kg)     Height 02/13/15 1643 6\' 2"  (1.88 m)     Head Cir --      Peak Flow --      Pain Score 02/13/15 1644 0     Pain Loc --      Pain Edu? --      Excl. in [place]? --     Constitutional: Alert and oriented. Well appearing and in no distress. ENT   Head: Normocephalic and atraumatic.   Mouth/Throat: Mucous membranes are moist. Cardiovascular: Normal rate, regular rhythm Respiratory: Normal respiratory effort without tachypnea nor retractions. Breath sounds are clear Gastrointestinal: Soft and nontender. No distention.  Musculoskeletal: Nontender with normal range of motion in all extremities.   Neurologic:  Normal speech and language. No gross focal neurologic deficits Skin:  Skin is warm, dry and intact.  Psychiatric: Mood and affect are normal. Speech and behavior are normal.   ____________________________________________    RADIOLOGY  CXR: No acute disease.  ____________________________________________  INITIAL IMPRESSION / ASSESSMENT AND PLAN / ED COURSE  Pertinent labs & imaging results that were available during my care of the patient were reviewed by me and considered in my medical decision making (see chart for details).  Patient with chronic anemia. Upon record review patient appears to have a hemoglobin at baseline around 7-9. Patient recently admitted 2 weeks ago for a GI bleed and confusion and received blood products at that time. It appears patient has trended back down to a hemoglobin of 5.3. Patient will require a rectal exam to evaluate for GI bleeding and will require blood transfusion. I anticipate patient will likely require admission to the hospital for further workup.  ----------------------------------------- 7:32 PM on 02/13/2015 -----------------------------------------  Rectal exam shows darker brown stool, strongly guaiac positive. We will admit for GI bleed results can anemia.  CRITICAL CARE Performed by: Harvest Dark   Total critical care time: 30 minutes  Critical care time was exclusive of separately billable procedures and treating other patients.  Critical care was necessary to treat or prevent imminent or life-threatening deterioration.  Critical care was time spent personally by me on the following activities: development of treatment plan with patient and/or surrogate as well as nursing, discussions with consultants, evaluation of patient's response to treatment, examination of patient, obtaining history from patient or surrogate, ordering and performing treatments and interventions, ordering and review of laboratory studies,  ordering and review of radiographic studies, pulse oximetry and re-evaluation of patient's condition.   ____________________________________________   FINAL CLINICAL IMPRESSION(S) / ED DIAGNOSES  Anemia GI bleed   Harvest Dark, MD 02/13/15 [phone]

## 2015-02-13 NOTE — ED Notes (Signed)
Attempted to call report to [facility].  Nurse in patient's room, [place] nurse will call back.

## 2015-02-13 NOTE — ED Notes (Signed)
Patient transported to X-ray 

## 2015-02-13 NOTE — ED Notes (Signed)
Patient was in hospital recently. Went to [facility] for rehab. Patient had labs drawn today that showed Hgb of 5.6 at facility, reverified with [facility] lab with result of 5.8 Hgb. Patient has no physical complaints. Patient is a dialysis patient who receives dialysis on Tues, Thurs, Sat. Has foley in place upon arrival. Patient is unsure if foley is present for obstruction or retention, but has f/u appt with urology on 02/15/15.

## 2015-02-14 LAB — BASIC METABOLIC PANEL
Anion gap: 12 (ref 5–15)
BUN: 49 mg/dL — ABNORMAL HIGH (ref 6–20)
CO2: 26 mmol/L (ref 22–32)
CREATININE: 5.64 mg/dL — AB (ref 0.61–1.24)
Calcium: 8.3 mg/dL — ABNORMAL LOW (ref 8.9–10.3)
Chloride: 103 mmol/L (ref 101–111)
GFR, EST AFRICAN AMERICAN: 10 mL/min — AB (ref >60)
GFR, EST NON AFRICAN AMERICAN: 9 mL/min — AB (ref >60)
GLUCOSE: 91 mg/dL (ref 65–99)
POTASSIUM: 4 mmol/L (ref 3.5–5.1)
SODIUM: 141 mmol/L (ref 135–145)

## 2015-02-14 LAB — CBC WITH DIFFERENTIAL/PLATELET
BASOS ABS: 0.1 10*3/uL (ref 0–0.1)
Basophils Relative: 1 %
EOS ABS: 0.6 10*3/uL (ref 0–0.7)
HCT: 24.9 % — ABNORMAL LOW (ref 40.0–52.0)
HEMOGLOBIN: 8.4 g/dL — AB (ref 13.0–18.0)
Lymphs Abs: 1.2 10*3/uL (ref 1.0–3.6)
MCH: 29.5 pg (ref 26.0–34.0)
MCHC: 33.8 g/dL (ref 32.0–36.0)
MCV: 87.4 fL (ref 80.0–100.0)
Monocytes Absolute: 0.7 10*3/uL (ref 0.2–1.0)
Monocytes Relative: 8 %
Neutro Abs: 5.8 10*3/uL (ref 1.4–6.5)
Neutrophils Relative %: 70 %
Platelets: 285 10*3/uL (ref 150–440)
RBC: 2.85 MIL/uL — AB (ref 4.40–5.90)
RDW: 17.4 % — ABNORMAL HIGH (ref 11.5–14.5)
WBC: 8.3 10*3/uL (ref 3.8–10.6)

## 2015-02-14 MED ORDER — TIOTROPIUM BROMIDE MONOHYDRATE 18 MCG IN CAPS
18.0000 ug | ORAL_CAPSULE | Freq: Every day | RESPIRATORY_TRACT | Status: DC
  Administered 2015-02-14 – 2015-02-17 (×4): 18 ug via RESPIRATORY_TRACT
  Filled 2015-02-14: qty 5

## 2015-02-14 MED ORDER — PANTOPRAZOLE SODIUM 40 MG PO TBEC
40.0000 mg | DELAYED_RELEASE_TABLET | Freq: Two times a day (BID) | ORAL | Status: DC
  Administered 2015-02-15 – 2015-02-17 (×3): 40 mg via ORAL
  Filled 2015-02-14 (×4): qty 1

## 2015-02-14 NOTE — Progress Notes (Signed)
Subjective:   Patient admitted for eval of low hemoglobin. Level was 5.9. 2 units of blood transfusion overnight.  Pt denies any melena or coughing/vomiting blood Due for dialysis today No SOB Pt reports chronic cough. Current smoker.   Objective:  Vital signs in last 24 hours:  Temp:  [97.2 F (36.2 C)-97.8 F (36.6 C)] 97.4 F (36.3 C) (05/24 0751) Pulse Rate:  [75-80] 77 (05/24 0751) Resp:  [14-26] 18 (05/24 0751) BP: (102-147)/(48-98) 136/68 mmHg (05/24 0751) SpO2:  [95 %-100 %] 100 % (05/24 0751) Weight:  [95.255 kg (210 lb)-98.068 kg (216 lb 3.2 oz)] 98.068 kg (216 lb 3.2 oz) (05/23 2147)  Weight change:  Filed Weights   02/13/15 1643 02/13/15 2147  Weight: 95.255 kg (210 lb) 98.068 kg (216 lb 3.2 oz)    Intake/Output: I/O last 3 completed shifts: In: 398 [I.V.:20; Blood:378] Out: 1400 [Urine:1400]   Intake/Output this shift:  Total I/O In: 327 [Blood:327] Out: 300 [Urine:300]  General: not in acute distress Neck: supple CVS: irregular rate and rhythm Resp: clear  ABD: +soft,  EXT: no edema Neuro: alert and oriented Access: Left arm AVF   Basic Metabolic Panel:  Recent Labs Lab 02/08/15 1650 02/14/15 0921  NA 141 141  K 3.9 4.0  CL 100* 103  CO2 30 26  GLUCOSE 95 91  BUN 47* 49*  CREATININE 5.71* 5.64*  CALCIUM 8.2* 8.3*    Liver Function Tests:  Recent Labs Lab 02/08/15 1650  AST 20  ALT 13*  ALKPHOS 88  BILITOT 0.5  PROT 7.2  ALBUMIN 2.5*   No results for input(s): LIPASE, AMYLASE in the last 168 hours. No results for input(s): AMMONIA in the last 168 hours.  CBC:  Recent Labs Lab 02/08/15 1650 02/13/15 1315 02/13/15 1632  WBC 9.0 8.2 7.8  NEUTROABS 5.4 5.6  --   HGB 7.1* 5.8* 5.9*  HCT 21.0* 17.9* 18.1*  MCV 86.1 87.2 87.5  PLT 257 277 277    Cardiac Enzymes: No results for input(s): CKTOTAL, CKMB, CKMBINDEX, TROPONINI in the last 168 hours.  BNP: Invalid input(s): POCBNP  CBG: No results for input(s):  GLUCAP in the last 168 hours.  Microbiology: Results for orders placed or performed during the hospital encounter of 01/18/15  Culture, blood (single)     Status: None (Preliminary result)   Collection Time: 01/18/15  7:11 PM  Result Value Ref Range Status   Micro Text Report   Preliminary       COMMENT                   NO GROWTH IN 57 HOURS   ANTIBIOTIC                                                      Culture, blood (single)     Status: None (Preliminary result)   Collection Time: 01/18/15  7:11 PM  Result Value Ref Range Status   Micro Text Report   Preliminary       COMMENT                   NO GROWTH IN 48 HOURS   ANTIBIOTIC  Urine culture     Status: None   Collection Time: 01/18/15  7:36 PM  Result Value Ref Range Status   Micro Text Report   Final       SOURCE: INDWELLING CATHETER    COMMENT                   NO GROWTH IN 36 HOURS   ANTIBIOTIC                                                      Urine culture     Status: None   Collection Time: 01/20/15  4:09 PM  Result Value Ref Range Status   Micro Text Report   Final       SOURCE: INDWELLING CATH    ORGANISM 1                2000 CFU/ML ENTEROBACTER SPECIES   COMMENT                   UNABLE TO FURTHER SPECIATE   COMMENT                   -   ANTIBIOTIC                    ORG#1     CEFAZOLIN                     R         CEFOXITIN                     R         CEFTRIAXONE                   R         CIPROFLOXACIN                 I         ERTAPENEM                     S         GENTAMICIN                    S         IMIPENEM                      S         LEVOFLOXACIN                  I         NITROFURANTOIN                I         TRIMETH/SULFA                 S             Coagulation Studies: No results for input(s): LABPROT, INR in the last 72 hours.  Urinalysis: No results for input(s): COLORURINE, LABSPEC, PHURINE, GLUCOSEU,  HGBUR, BILIRUBINUR, KETONESUR, PROTEINUR, UROBILINOGEN, NITRITE, LEUKOCYTESUR in the last 72 hours.  Invalid input(s): APPERANCEUR    Imaging: Dg Chest 2 View  02/13/2015   CLINICAL DATA:  Patient has had a dry cough for two days, he is hoarse when he speaks. Hx PAF, cancer, ESRD, GERD, COPD, CHF  EXAM: CHEST  2 VIEW  COMPARISON:  01/30/2015  FINDINGS: There has been improvement since the prior study. There are now only small pleural effusions there is mild dependent atelectasis. No convincing pulmonary edema. Lungs remain mildly hyperexpanded. There is mild enlargement of the cardiopericardial silhouette. Aorta is uncoiled. No mediastinal or hilar masses or evidence of adenopathy.  No pneumothorax.  IMPRESSION: 1. No acute cardiopulmonary disease. 2. Interval improvement from the prior study with decreased pleural fluid, most evident on the left, with only small residual pleural effusions with mild associated dependent atelectasis. No pulmonary edema.   Electronically Signed   By: Lajean Manes M.D.   On: 02/13/2015 19:18     Medications:     . sodium chloride  10 mL/hr Intravenous Once  . amiodarone  200 mg Oral Daily  . amLODipine  5 mg Oral Daily  . atorvastatin  40 mg Oral Daily  . budesonide-formoterol  2 puff Inhalation BID  . magnesium oxide  400 mg Oral QHS  . multivitamin  1 tablet Oral Daily  . pantoprazole  40 mg Oral Daily  . sevelamer carbonate  800 mg Oral TID WC  . sodium chloride  3 mL Intravenous Q12H  . Tiotropium Bromide Monohydrate  1 puff Inhalation Daily   acetaminophen **OR** acetaminophen, albuterol, HYDROcodone-acetaminophen, ondansetron **OR** ondansetron (ZOFRAN) IV  Assessment/ Plan:  74 y.o. 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 Hemodialysis  - Continue TTS schedule.   -Will schedule dialysis today   2. Anemia of chronic kidney disease: D63.1  - holding epo due to prostate cancer.  - transfusion on 5/5 and 5/6  - 2 units given overnight  - GI evaluation pending   3. Secondary Hyperparathyroidism: N25.81  - continue low dose renvela      LOS: 1 Joey Hudock 5/24/201611:58 AM

## 2015-02-14 NOTE — Progress Notes (Signed)
[place] at [facility]                                                                                                                                                                                            Patient Demographics   [name], is a 74 y.o. male, DOB - [birth year], UYE:[id number]  Admit date - 02/13/2015   Admitting Physician Hillary Bow, MD  Outpatient Primary MD for the patient is Alvester Morin, MD  LOS - 1  Chief Complaint  Patient presents with  . Anemia      Patient was admitted with worsening anemia. He was recently in the hospital. With Enterobacter UTI. And at that time was transfused with 2 units of packed RBCs, now readmitted with anemia again. Patient states that he was feeling fine and did not notice any type of blood in his stool. He received 2 units of packed RBCs hemoglobin is improved today. He is chronically short of breath  Review of Systems:   CONSTITUTIONAL: No documented fever. No fatigue, weakness. No weight gain, no weight loss.  EYES: No blurry or double vision.  ENT: No tinnitus. No postnasal drip. No redness of the oropharynx.  RESPIRATORY: No cough, no wheeze, no hemoptysis. Chronic shortness of breath.  CARDIOVASCULAR: No chest pain. No orthopnea. No palpitations. No syncope.  GASTROINTESTINAL: No nausea, no vomiting or diarrhea. No abdominal pain. No melena or hematochezia.  GENITOURINARY: No dysuria or hematuria.  ENDOCRINE: No polyuria or nocturia. No heat or cold intolerance.  HEMATOLOGY: Positive anemia. No bruising. No bleeding.  INTEGUMENTARY: No rashes. No lesions.  MUSCULOSKELETAL: No arthritis. No swelling. No gout.  NEUROLOGIC: No numbness, tingling, or ataxia. No seizure-type activity.  PSYCHIATRIC: No anxiety. No insomnia. No ADD.    Vitals:   Filed Vitals:   02/14/15 1310 02/14/15 1330 02/14/15 1400 02/14/15 1430  BP: 144/84 135/73 133/79 134/75  Pulse: 78 74 77 76   Temp:      TempSrc:      Resp: 18 25 25 25   Height:      Weight:      SpO2: 100% 98% 100% 98%    Wt Readings from Last 3 Encounters:  02/14/15 93 kg (205 lb 0.4 oz)  02/08/15 95.709 kg (211 lb)  02/04/15 93.8 kg (206 lb 12.7 oz)     Intake/Output Summary (Last 24 hours) at 02/14/15 1511 Last data filed at 02/14/15 1244  Gross per 24 hour  Intake    725 ml  Output   2000 ml  Net  -1275 ml    Physical Exam:   GENERAL:  Pleasant-appearing in no apparent distress.  HEAD, EYES, EARS, NOSE AND THROAT: Atraumatic, normocephalic. Extraocular muscles are intact. Pupils equal and reactive to light. Sclerae anicteric. No conjunctival injection. No oro-pharyngeal erythema.  NECK: Supple. There is no jugular venous distention. No bruits, no lymphadenopathy, no thyromegaly.  HEART: Regular rate and rhythm, tachycardic. No murmurs, no rubs, no clicks.  LUNGS: Clear to auscultation bilaterally. No rales or rhonchi. No wheezes.  ABDOMEN: Soft, flat, nontender, nondistended. Has good bowel sounds. No hepatosplenomegaly appreciated.  EXTREMITIES: No evidence of any cyanosis, clubbing, or peripheral edema.  +2 pedal and radial pulses bilaterally.  NEUROLOGIC: The patient is alert, awake, and oriented x3 with no focal motor or sensory deficits appreciated bilaterally.  SKIN: Moist and warm with no rashes appreciated.  Psych: Not anxious, depressed LN: No inguinal LN enlargement    Antibiotics   Anti-infectives    None      Medications   Scheduled Meds: . sodium chloride  10 mL/hr Intravenous Once  . amiodarone  200 mg Oral Daily  . amLODipine  5 mg Oral Daily  . atorvastatin  40 mg Oral Daily  . budesonide-formoterol  2 puff Inhalation BID  . magnesium oxide  400 mg Oral QHS  . multivitamin  1 tablet Oral Daily  . pantoprazole  40 mg Oral Daily  . sevelamer carbonate  800 mg Oral TID WC  . sodium chloride  3 mL Intravenous Q12H  . tiotropium  18 mcg Inhalation Daily    Continuous Infusions:  PRN Meds:.acetaminophen **OR** acetaminophen, albuterol, HYDROcodone-acetaminophen, ondansetron **OR** ondansetron (ZOFRAN) IV   Data Review:   Micro Results No results found for this or any previous visit (from the past 240 hour(s)).  Radiology Reports Dg Chest 2 View  02/13/2015   CLINICAL DATA:  Patient has had a dry cough for two days, he is hoarse when he speaks. Hx PAF, cancer, ESRD, GERD, COPD, CHF  EXAM: CHEST  2 VIEW  COMPARISON:  01/30/2015  FINDINGS: There has been improvement since the prior study. There are now only small pleural effusions there is mild dependent atelectasis. No convincing pulmonary edema. Lungs remain mildly hyperexpanded. There is mild enlargement of the cardiopericardial silhouette. Aorta is uncoiled. No mediastinal or hilar masses or evidence of adenopathy.  No pneumothorax.  IMPRESSION: 1. No acute cardiopulmonary disease. 2. Interval improvement from the prior study with decreased pleural fluid, most evident on the left, with only small residual pleural effusions with mild associated dependent atelectasis. No pulmonary edema.   Electronically Signed   By: Lajean Manes M.D.   On: 02/13/2015 19:18   Dg Chest 2 View  01/30/2015   CLINICAL DATA:  Shortness of breath  EXAM: CHEST  2 VIEW  COMPARISON:  CT chest dated 01/26/2015  FINDINGS: Patchy bilateral lower lobe opacities, likely atelectasis. Moderate left pleural effusion. No pneumothorax.  Right IJ venous catheter terminates in the mid SVC.  Cardiomegaly with pericardial effusion on prior CT.  Enteric tube courses below the diaphragm.  IMPRESSION: Moderate left pleural effusion with bilateral lower lobe atelectasis.  Cardiomegaly with pericardial effusion on prior CT.   Electronically Signed   By: Julian Hy M.D.   On: 01/30/2015 08:47   Dg Chest 2 View  01/26/2015   CLINICAL DATA:  CURRENT PNA, SBO, PT HAS NG TUBE, PT IS ON DIALYSIS AND HAS HX OF PAF AND CHF  EXAM: CHEST  2 VIEW   COMPARISON:  01/18/2015  FINDINGS: Moderate enlargement of the cardiopericardial silhouette. Small  left and probable small right pleural effusions.  No mediastinal or hilar masses.  Mild lung base opacity, most likely atelectasis. On the left, pneumonia should be considered likely if there are consistent clinical symptoms.  No pulmonary edema.  No pneumothorax.  Nasogastric tube, new, passes below the diaphragm into the stomach.  Right internal jugular central venous line has its tip in the upper superior vena cava, which appears retracted superiorly from the previous exam.  IMPRESSION: 1. Stable cardiomegaly. 2. Lung base opacity, left greater than right. There is a small left pleural effusion and a probable small right pleural effusion. Additional lung base opacity is likely atelectasis. Pneumonia in the left is possible P the appearance is similar to the most recent prior exam. 3. No pulmonary edema.  No new lung abnormalities. 4. Right IJ central venous line appears retracted. Tip now projects in the upper superior vena cava. 5. Nasogastric tube passes below the diaphragm into the stomach, new from the prior study.   Electronically Signed   By: Lajean Manes M.D.   On: 01/26/2015 14:41   Dg Abd 1 View  02/02/2015   CLINICAL DATA:  Ileus, followup  EXAM: ABDOMEN - 1 VIEW  COMPARISON:  Abdomen films of 01/31/2015  FINDINGS: There is both large and small bowel gas present without significant distention noted. Bilateral double-J ureteral stents remain. Iliac artery stents also are noted as well as coil embolization of the internal iliac arteries bilaterally. There is some opacity at the left lung base not well evaluated on this abdomen film.  IMPRESSION: No bowel obstruction or significant ileus.   Electronically Signed   By: Ivar Drape M.D.   On: 02/02/2015 08:17   Dg Abd 1 View  01/27/2015   CLINICAL DATA:  Ileus, abdominal pain  EXAM: ABDOMEN - 1 VIEW  COMPARISON:  01/26/2015  FINDINGS: Tip of nasogastric  tube projects over gastric antrum.  Prior endoluminal stenting of abdominal aorta and iliac arteries.  BILATERAL ureteral stents present.  Dilatation of small bowel loops without bowel wall thickening.  Scattered gas within nondistended colon and rectum.  Bones diffusely demineralized.  Atelectasis versus consolidation LEFT lower lobe.  IMPRESSION: Diffuse small bowel dilatation, question ileus though obstruction not excluded ; radiographic followup as clinically indicated.  Atelectasis versus consolidation LEFT lower lobe.   Electronically Signed   By: Lavonia Dana M.D.   On: 01/27/2015 11:10   Dg Abd 1 View  01/26/2015   CLINICAL DATA:  Increased abdominal distension  EXAM: ABDOMEN - 1 VIEW  COMPARISON:  None.  FINDINGS: There is gaseous distention of small bowel and colon. There is no evidence of pneumoperitoneum, portal venous gas or pneumatosis. There are no pathologic calcifications along the expected course of the ureters. There are bilateral ureteral stents. There is an aorto bi-iliac stent graft present. There are embolization coils which are likely within the bilateral internal iliac arteries. 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  IMPRESSION: Gaseous distention of small bowel colon concerning for an ileus.   Electronically Signed   By: Kathreen Devoid   On: 01/26/2015 21:21   Dg Abd 1 View  01/26/2015   CLINICAL DATA:  Small-bowel obstruction.  EXAM: ABDOMEN - 1 VIEW  COMPARISON:  01/25/2015.  FINDINGS: NG tube tip projected over the upper stomach. Bilateral double-J ureteral stents. Aorto iliac stent graft. Hypogastric coils noted. Mild improvement of small bowel distention. Small bowel distention however remains. No free air. Persistent left lower lobe atelectasis and/or infiltrate with  left pleural effusion.  IMPRESSION: 1. NG tube tip projected over the upper stomach. Again advancement of approximately 10 cm is suggested to ensure that the proximal port lies below the GE junction. 2.  Persistent but slightly improved small bowel distention. 3. Persistent left lower lobe atelectasis and/or infiltrate with left pleural effusion.  These results will be called to the ordering clinician or representative by the Radiologist Assistant, and communication documented in the PACS or zVision Dashboard.   Electronically Signed   By: Marcello Moores  Register   On: 01/26/2015 07:29   Dg Abd 1 View  01/25/2015   CLINICAL DATA:  Abdominal pain, improving symptoms  EXAM: ABDOMEN - 1 VIEW  COMPARISON:  Supine abdominal film dated Jan 24, 2015  FINDINGS: There has been interval placement of a nasogastric tube. The proximal port lies at or just below the level of the GE junction. There remain loops of mildly distended gas-filled small bowel. The caliber of the colon is normal. There small amount of rectal gas. Bilateral double-J ureteral stents are unchanged in position. There is an aorto bi-iliac stent graft in place. There are metallic coils in the pelvis likely from previous embolization procedures.  IMPRESSION: There has been slight interval increase in the volume of small bowel gas consistent with a mid to distal small bowel obstruction. Advancement of the nasogastric tube by 10 cm is recommended to assure that the proximal port lies below the GE junction.   Electronically Signed   By: David  Martinique M.D.   On: 01/25/2015 07:53   Dg Abd 1 View  01/24/2015   CLINICAL DATA:  Ileus.  EXAM: ABDOMEN - 1 VIEW  COMPARISON:  01/23/2015.  01/22/2015.  FINDINGS: Bilateral double-J ureteral stents noted. Prior aortoiliac stent graft placement with bilateral hypogastric coils. Surgical clips in the abdomen and pelvis. Dilated loops of small bowel noted. These have slightly improved. Colon is nondistended. Oral contrast is present in the colon. No free air. Vascular calcification. Left lower lobe atelectasis and/or infiltrate with small left pleural effusion. Cardiomegaly. Diffuse osteopenia. Degenerative changes lumbar spine and  both hips.  IMPRESSION: 1. Dilated loops of small bowel noted. Small bowel obstruction cannot be excluded . Slight interim improvement. 2. Bilateral double-J ureteral stents in good anatomic position. 3. Aortoiliac stent graft noted. 4. Left lower lobe atelectasis and/or infiltrate with small left pleural effusion. 5. Cardiomegaly.   Electronically Signed   By: Marcello Moores  Register   On: 01/24/2015 08:27   Dg Abd 1 View  01/23/2015   CLINICAL DATA:  Abdominal pain, nausea, abdominal distension, ileus  EXAM: ABDOMEN - 1 VIEW  COMPARISON:  01/22/2015  FINDINGS: Pelvic embolization coils.  Aortoiliac stenting.  BILATERAL ureteral stents.  Persistent dilatation of small bowel loops question ileus versus small bowel obstruction, with slightly less small bowel gas and on the previous study.  No definite bowel wall thickening.  Bones diffusely demineralized.  IMPRESSION: Slightly decreased small bowel gaseous distention since previous exam, question ileus versus small bowel obstruction.   Electronically Signed   By: Lavonia Dana M.D.   On: 01/23/2015 14:11   Dg Abd 1 View  01/22/2015   CLINICAL DATA:  74 year old male with a history of abdominal pain. Nausea  EXAM: ABDOMEN - 1 VIEW  COMPARISON:  01/21/2015, 01/18/2015  FINDINGS: Two frontal supine images.  Multiple distended small bowel loops, as was seen on the comparison study. Paucity of colonic gas. Small amount of retained enteric contrast within the left colon. The enteric contrast was  not present on the comparison plain film study, and has progressed through the colon in the last 24 hours.  No definite evidence of free air.  Diffuse osteopenia.  No acute fracture.  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 Changes of bilateral hypogastric artery coiling.  Wire-less pressure monitor to the left lateral aspect of the endograft.  Bilateral double-J ureteral stents in place.  IMPRESSION: Persisting  dilated small bowel loops, either an ileus or small-bowel obstruction. If there is concern for acute intra abdominal process, CT would be indicated.  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 A wireless intrasac pressure monitor (CardioMEMES device) projects to the left aspect of the graft.  Unchanged position of bilateral double-J ureteral stents.  Signed,  Dulcy Fanny. Earleen Newport, DO  [facility]  [facility]   Electronically Signed   By: Corrie Mckusick D.O.   On: 01/22/2015 09:40   Dg Abd 1 View  01/21/2015   CLINICAL DATA:  Abdominal pain  EXAM: ABDOMEN - 1 VIEW  COMPARISON:  CT abdomen and pelvis January 18, 2015  FINDINGS: There are double-J stents bilaterally. There is also an aorto bi-iliac stent for aneurysm. There is generalized bowel dilatation in a pattern suggesting ileus. No free air is seen on this supine examination. There are no air-fluid levels. There is atelectatic change in the lung bases. There are coils in the pelvis, presumably due to internal iliac artery embolization.  IMPRESSION: Bowel gas pattern most consistent with ileus. No free air seen. Double-J stents present as well as aortoiliac stent. Coils are present in the pelvis as well.   Electronically Signed   By: Lowella Grip III M.D.   On: 01/21/2015 10:12   Ct Chest Wo Contrast  01/26/2015   CLINICAL DATA:  Encephalopathy, shortness of breath  EXAM: CT CHEST WITHOUT CONTRAST  TECHNIQUE: Multidetector CT imaging of the chest was performed following the standard protocol without IV contrast.  COMPARISON:  None.  FINDINGS: The central airways are patent. There are small bilateral pleural effusions, left greater than right. There is no focal consolidation. There is mild bibasilar atelectasis. There is no pneumothorax.  There are no pathologically enlarged axillary, hilar or mediastinal lymph nodes.  The heart size is normal. There is a  moderate pericardial effusion measuring 3.2 cm along the left ventricle. 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 There is multi vessel coronary artery atherosclerosis involving the left main, lad, circumflex and RCA.  Review of bone windows demonstrates no focal lytic or sclerotic lesions.  Limited non-contrast images of the upper abdomen were obtained. There are 2 hypodense nodules arising from the right adrenal gland measuring 14 mm and 18 mm respectively most consistent with adrenal adenomas. There is nodularity of the left adrenal gland. There is a nasogastric tube with the tip in the stomach. Partially visualized is left hydronephrosis.  IMPRESSION: 1. Bilateral small pleural effusions, left greater than right with bibasilar atelectasis. 2. Interval development of a moderate pericardial effusion measuring 3.2 cm along the left lateral ventricle. 3. Multi vessel coronary artery atherosclerosis. 4. Distal aortic arch measures 4.7 cm in diameter. Recommend semi-annual imaging followup by CTA or MRA and referral to cardiothoracic surgery if not already obtained. This recommendation follows 2010 ACCF/AHA/AATS/ACR/ASA/SCA/SCAI/SIR/STS/SVM Guidelines for the Diagnosis and Management of Patients With Thoracic Aortic Disease. Circulation. 2010; 121: [id number]  Electronically Signed   By: Kathreen Devoid   On: 01/26/2015 21:20   US Renal  01/30/2015   CLINICAL DATA:  Subsequent encounter for hydronephrosis  EXAM: RENAL / URINARY TRACT ULTRASOUND COMPLETE  COMPARISON:  CT scan from 01/18/2015.  FINDINGS: Right Kidney:  Length: 11.5 cm. Collecting system fullness noted without overt hydronephrosis.  Left Kidney:  Length: 11.2 cm. Mild fullness of the intrarenal collecting system noted. Echogenic structure identified in the interpolar region presumably related to internal ureteral stent.  Bladder:  Decompressed by Foley catheter.  Note:  Nodule seen adjacent to the upper pole  the right kidney compatible with adrenal nodularity seen on previous CT scan. 15 mm gallstone noted.  IMPRESSION: Mild fullness of both intrarenal collecting systems. Urinary bladder is decompressed by Foley catheter.  Cholelithiasis.  Right adrenal nodularity.   Electronically Signed   By: Misty Stanley M.D.   On: 01/30/2015 10:33   Dg Chest Port 1 View  01/18/2015   CLINICAL DATA:  74 year old male status post central line placement  EXAM: PORTABLE CHEST - 1 VIEW  COMPARISON:  CT scan chest/abdomen/ pelvis obtained earlier today ; most recent prior chest x-ray 07/08/2014  FINDINGS: External defibrillator pad projects over the chest. Interval placement of a right IJ approach central venous catheter. The catheter is somewhat lateral in position. The tip is in the region of the distal SVC. Increased enlargement of the cardiopericardial silhouette. And atherosclerotic thoracic aorta. Pulmonary vascular congestion. All  IMPRESSION: 1. The tip of the right IJ approach central venous catheter is in the region of the distal SVC although it is slightly more lateral than expected. Recommend clinical correlation to insure that the catheter is within the venous system. 2. No evidence of pneumothorax or new pleural effusion. 3. Cardiomegaly. 4. Pulmonary vascular congestion, low lung volumes and bibasilar atelectasis.   Electronically Signed   By: Jacqulynn Cadet M.D.   On: 01/18/2015 21:22   Dg Abd 2 Views  01/31/2015   CLINICAL DATA:  Mild small bowel ileus  EXAM: ABDOMEN - 2 VIEW  COMPARISON:  01/30/2015  FINDINGS: Nasogastric catheter remains in place. Bilateral ureteral stents as well as an aortic stent graft are noted. No free intraperitoneal air is seen. Scattered large and small bowel gas is noted. A few mildly dilated loops of small bowel are noted although the overall appearance has improved slightly in the interval from the prior exam.  IMPRESSION: Slight improvement in the degree of small bowel dilatation.    Electronically Signed   By: Inez Catalina M.D.   On: 01/31/2015 08:19   Dg Abd 2 Views  01/30/2015   CLINICAL DATA:  Adynamic ileus.  EXAM: ABDOMEN - 2 VIEW  COMPARISON:  Jan 27, 2015.  FINDINGS: Small bowel dilatation seen on prior exam is significantly improved, although residual dilatation remains in left lower quadrant of the abdomen. Distal tip of nasogastric tube is seen in proximal stomach. Bilateral ureteral stents are again noted and unchanged. Status post stent graft repair of abdominal aortic aneurysm. Coil embolization of both internal iliac arteries is noted. Degenerative change of both hip joints is noted.  IMPRESSION: Mild small bowel dilatation is noted which is significantly improved compared to prior exam, most consistent with resolving ileus.   Electronically Signed   By: Marijo Conception, M.D.   On: 01/30/2015 08:42   Ct Angiography Chest/abd/pelvis ([facility] Hx)  01/18/2015   CLINICAL DATA:  Patient to ED via EMS from home with c.o respiratory  distress, atrial fib, and groin pain.  EXAM: CT ANGIOGRAPHY CHEST, ABDOMEN AND PELVIS  TECHNIQUE: Multidetector CT imaging through the chest, abdomen and pelvis was performed using the standard protocol during bolus administration of intravenous contrast. Multiplanar reconstructed images and MIPs were obtained and reviewed to evaluate the vascular anatomy.  CONTRAST:  100 mL of Omnipaque 350 intravenous contrast.  COMPARISON:  None.  FINDINGS: CTA CHEST FINDINGS  Thoracic aorta shows atherosclerotic irregularity along the arch and descending aorta. There is mild dilation of the thoracic aorta most prominently of the descending aorta where it measures 4.8 cm. No aortic dissection. The aortic branch vessels are widely patent. Mild atherosclerotic calcifications are noted the base of the left subclavian artery and along the innominate artery.  The heart is moderately enlarged. There are moderate coronary artery calcifications. No neck base or axillary masses  or adenopathy. No mediastinal or hilar masses or adenopathy. There is lower lobe opacity, left greater than right, as well as dependent left upper lobe opacity, most likely all atelectasis. Pneumonia is not excluded but felt less likely. There is no pulmonary edema. 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 Minimal left pleural effusion. No pneumothorax.  Review of the MIP images confirms the above findings.  CTA ABDOMEN AND PELVIS FINDINGS  There is an excluded infrarenal abdominal aortic aneurysm. Native aneurysm measures 5.4 cm x 4.7 cm. The excluded lumen is widely patent. Stents extend into the external iliac arteries, also widely patent. There is dilation of the common femoral arteries. There is an aneurysm or pseudoaneurysm surrounding the right common femoral artery and proximal superficial femoral and deep femoral arteries, measuring 5.7 cm x 5.3 cm. There is no evidence of aortic rupture.  There is significant narrowing at the origin of the celiac axis, measuring 70%. The superior mesenteric artery is widely patent. Mild plaque is noted on the renal arteries without significant stenosis.  Liver, spleen, gallbladder, pancreas: Unremarkable. There is mild thickening of the adrenal glands suggesting nodular hyperplasia. This is stable. There is significant bilateral hydronephrosis. Bilateral ureteral stents extend from the renal pelves to the bladder. There is bilateral renal cortical thinning. No convincing renal mass. Bladder mildly thick-walled. Is distended.  No discrete enlarged lymph node.  No abnormal fluid collections.  There are scattered colonic diverticula. No diverticulitis. No bowel wall thickening or mesenteric inflammation. There is no bowel dilation to suggest obstruction.  MUSCULOSKELETAL  Bones are demineralized. There are degenerative changes throughout the visualized spine. There are areas of sclerosis in the posterior aspect of the T12 vertebrae with a small focus of sclerosis in  the T7 vertebrae. T12 focus of sclerosis is chronic unchanged from the prior abdomen and pelvis CT. There are no fractures. No osteolytic lesions. There are arthropathic changes of both hip joints.  Review of the MIP images confirms the above findings.  IMPRESSION: 1. No evidence of aortic dissection or of a a rupture of the abdominal aortic aneurysm. 2. No acute vascular conclusion. 3. Dilation of the descending thoracic aorta. 4. Excluded infrarenal abdominal aortic aneurysm. The excluded portion of the aneurysm is widely patent. 5. 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 6. Bilateral significant hydronephrosis with bilateral ureteral stents which appear well-positioned. The degree of hydronephrosis and the ureteral stents are stable from the prior CT. 7. Mild bladder wall thickening. Consider cystitis in the proper clinical setting. 8. In the chest, there is moderate cardiomegaly and dependent atelectasis, greater on the left.  No convincing pneumonia and no pulmonary edema.   Electronically Signed   By: Lajean Manes M.D.   On: 01/18/2015 18:41   US Kidneys ([facility] Hx)  01/19/2015   ADDENDUM REPORT: 01/19/2015 16:52  ADDENDUM: When directly comparing to recent CT, the hydronephrosis in the right kidney is moderately improved, particularly in the interpolar region and lower pole collecting system. The hydronephrosis in the left kidney is mildly improved.  Notably, both kidneys are likely mildly improved when compared to CT dated 06/24/2014, indicating that the overall appearance is chronic.   Electronically Signed   By: Julian Hy M.D.   On: 01/19/2015 16:52   01/19/2015   CLINICAL DATA:  Bilateral hydronephrosis  EXAM: RENAL / URINARY TRACT ULTRASOUND COMPLETE  COMPARISON:  CT abdomen pelvis dated 01/18/2015  FINDINGS: Right Kidney:  Length: 11.7 cm. Moderate hydronephrosis. Indwelling ureteral stent.  Left Kidney:  Length: 11.9 cm.  Moderate hydronephrosis. Indwelling ureteral stent.  Bladder:  Decompressed by indwelling Foley catheter.  IMPRESSION: Moderate hydronephrosis with indwelling ureteral stents bilaterally.  Bladder decompressed by indwelling Foley catheter.  Electronically Signed: By: Julian Hy M.D. On: 01/19/2015 16:23     CBC  Recent Labs Lab 02/08/15 1650 02/13/15 1315 02/13/15 1632 02/14/15 0921  WBC 9.0 8.2 7.8 8.3  HGB 7.1* 5.8* 5.9* 8.4*  HCT 21.0* 17.9* 18.1* 24.9*  PLT 257 277 277 285  MCV 86.1 87.2 87.5 87.4  MCH 29.1 28.4 28.3 29.5  MCHC 33.8 32.6 32.3 33.8  RDW 18.5* 19.1* 18.7* 17.4*  LYMPHSABS 1.7 1.2  --  1.2  MONOABS 1.0 0.8  --  0.7  EOSABS 0.8* 0.6  --  0.6  BASOSABS 0.1 0.0  --  0.1    Chemistries   Recent Labs Lab 02/08/15 1650 02/14/15 0921  NA 141 141  K 3.9 4.0  CL 100* 103  CO2 30 26  GLUCOSE 95 91  BUN 47* 49*  CREATININE 5.71* 5.64*  CALCIUM 8.2* 8.3*  AST 20  --   ALT 13*  --   ALKPHOS 88  --   BILITOT 0.5  --    ------------------------------------------------------------------------------------------------------------------ estimated creatinine clearance is 13.6 mL/min (by C-G formula based on Cr of 5.64). ------------------------------------------------------------------------------------------------------------------ No results for input(s): HGBA1C in the last 72 hours. ------------------------------------------------------------------------------------------------------------------ No results for input(s): CHOL, HDL, LDLCALC, TRIG, CHOLHDL, LDLDIRECT in the last 72 hours. ------------------------------------------------------------------------------------------------------------------ No results for input(s): TSH, T4TOTAL, T3FREE, THYROIDAB in the last 72 hours.  Invalid input(s): FREET3 ------------------------------------------------------------------------------------------------------------------ No results for input(s): VITAMINB12,  FOLATE, FERRITIN, TIBC, IRON, RETICCTPCT in the last 72 hours.  Coagulation profile No results for input(s): INR, PROTIME in the last 168 hours.  No results for input(s): DDIMER in the last 72 hours.  Cardiac Enzymes No results for input(s): CKMB, TROPONINI, MYOGLOBIN in the last 168 hours.  Invalid input(s): CK ------------------------------------------------------------------------------------------------------------------ Invalid input(s): [place]   1. Acute on chronic anemia due to GI blood loss. Patient likely has slow chronic blood loss. Status post transfusion of 50-year-old units of packed RBCs. Hemmoglobin  is stable at 8.3  2. End-stage renal disease on hemodialysis with Tuesday, Thursday, Saturday schedule. Will receive hemodialysis today  3. Hypertension. Continue amlodipine  4. Paroxysmal atrial fibrillation. Continue amiodarone. No anticoagulation  5. Chronic Foley catheter with history of bladder and prostate cancer. Patient has appointment with urology on 02/15/2015.  6. Chronic respiratory failure/ chronic COPD without evidence of acute exacerbation T Inhalers     Code Status Orders  Start     Ordered   02/13/15 2205  Full code   Continuous     02/13/15 2204      Family Communication: patient   Procedures none   Consults  GI   DVT Prophylaxis   SCDs  Lab Results  Component Value Date   PLT 285 02/14/2015     Time Spent in minutes   60min   Dustin Flock M.D on 02/14/2015 at 3:11 PM  Between 7am to 6pm - Pager - [phone]  After 6pm go to [email or web address] - password [email or web address] [facility]   Office  [phone]

## 2015-02-14 NOTE — Progress Notes (Signed)
HD tx completed.

## 2015-02-14 NOTE — Progress Notes (Signed)
Pt stated that he has been feeling short of breath for a while - placed on 1L O2 for comfort - O2 sats are stable - will continue to monitor.

## 2015-02-14 NOTE — Consult Note (Signed)
Subjective: Patient seen for anemia and occult positive stool. Please see full GI consult.  Objective: Vital signs in last 24 hours: Temp:  [97.2 F (36.2 C)-97.8 F (36.6 C)] 97.8 F (36.6 C) (05/24 1739) Pulse Rate:  [74-86] 82 (05/24 1739) Resp:  [14-31] 18 (05/24 1739) BP: (102-147)/(48-98) 145/71 mmHg (05/24 1739) SpO2:  [93 %-100 %] 93 % (05/24 1739) Weight:  [93 kg (205 lb 0.4 oz)-98.068 kg (216 lb 3.2 oz)] 93.3 kg (205 lb 11 oz) (05/24 1655) Blood pressure 145/71, pulse 82, temperature 97.8 F (36.6 C), temperature source Oral, resp. rate 18, height 6\' 2"  (1.88 m), weight 93.3 kg (205 lb 11 oz), SpO2 93 %.   Intake/Output from previous day: 05/23 0701 - 05/24 0700 In: 398 [I.V.:20; Blood:378] Out: 1400 [Urine:1400]  Intake/Output this shift:     General appearance:  Elderly-appearing 74 year old male Resp:  Intermittent respiratory difficulty with talking. Cardio:  Regular rate and rhythm GI:  Soft nontender nondistended bowel sounds positive normoactive Extremities:     Lab Results: Results for orders placed or performed during the hospital encounter of 02/13/15 (from the past 24 hour(s))  CBC with Differential/Platelet     Status: Abnormal   Collection Time: 02/14/15  9:21 AM  Result Value Ref Range   WBC 8.3 3.8 - 10.6 K/uL   RBC 2.85 (L) 4.40 - 5.90 MIL/uL   Hemoglobin 8.4 (L) 13.0 - 18.0 g/dL   HCT 24.9 (L) 40.0 - 52.0 %   MCV 87.4 80.0 - 100.0 fL   MCH 29.5 26.0 - 34.0 pg   MCHC 33.8 32.0 - 36.0 g/dL   RDW 17.4 (H) 11.5 - 14.5 %   Platelets 285 150 - 440 K/uL   Neutrophils Relative % 70% %   Neutro Abs 5.8 1.4 - 6.5 K/uL   Lymphocytes Relative 14% %   Lymphs Abs 1.2 1.0 - 3.6 K/uL   Monocytes Relative 8% %   Monocytes Absolute 0.7 0.2 - 1.0 K/uL   Eosinophils Relative 7% %   Eosinophils Absolute 0.6 0 - 0.7 K/uL   Basophils Relative 1% %   Basophils Absolute 0.1 0 - 0.1 K/uL  Basic metabolic panel     Status: Abnormal   Collection Time: 02/14/15   9:21 AM  Result Value Ref Range   Sodium 141 135 - 145 mmol/L   Potassium 4.0 3.5 - 5.1 mmol/L   Chloride 103 101 - 111 mmol/L   CO2 26 22 - 32 mmol/L   Glucose, Bld 91 65 - 99 mg/dL   BUN 49 (H) 6 - 20 mg/dL   Creatinine, Ser 5.64 (H) 0.61 - 1.24 mg/dL   Calcium 8.3 (L) 8.9 - 10.3 mg/dL   GFR calc non Af Amer 9 (L) >60 mL/min   GFR calc Af Amer 10 (L) >60 mL/min   Anion gap 12 5 - 15      Recent Labs  02/13/15 1315 02/13/15 1632 02/14/15 0921  WBC 8.2 7.8 8.3  HGB 5.8* 5.9* 8.4*  HCT 17.9* 18.1* 24.9*  PLT 277 277 285   BMET  Recent Labs  02/14/15 0921  NA 141  K 4.0  CL 103  CO2 26  GLUCOSE 91  BUN 49*  CREATININE 5.64*  CALCIUM 8.3*   LFT No results for input(s): PROT, ALBUMIN, AST, ALT, ALKPHOS, BILITOT, BILIDIR, IBILI in the last 72 hours. PT/INR No results for input(s): LABPROT, INR in the last 72 hours. Hepatitis Panel No results for input(s): HEPBSAG, [place], [place],  HEPBIGM in the last 72 hours. C-Diff No results for input(s): CDIFFTOX in the last 72 hours. No results for input(s): CDIFFPCR in the last 72 hours.   Studies/Results: Dg Chest 2 View  02/13/2015   CLINICAL DATA:  Patient has had a dry cough for two days, he is hoarse when he speaks. Hx PAF, cancer, ESRD, GERD, COPD, CHF  EXAM: CHEST  2 VIEW  COMPARISON:  01/30/2015  FINDINGS: There has been improvement since the prior study. There are now only small pleural effusions there is mild dependent atelectasis. No convincing pulmonary edema. Lungs remain mildly hyperexpanded. There is mild enlargement of the cardiopericardial silhouette. Aorta is uncoiled. No mediastinal or hilar masses or evidence of adenopathy.  No pneumothorax.  IMPRESSION: 1. No acute cardiopulmonary disease. 2. Interval improvement from the prior study with decreased pleural fluid, most evident on the left, with only small residual pleural effusions with mild associated dependent atelectasis. No pulmonary edema.    Electronically Signed   By: Lajean Manes M.D.   On: 02/13/2015 19:18    Scheduled Inpatient Medications:   . sodium chloride  10 mL/hr Intravenous Once  . amiodarone  200 mg Oral Daily  . amLODipine  5 mg Oral Daily  . atorvastatin  40 mg Oral Daily  . budesonide-formoterol  2 puff Inhalation BID  . magnesium oxide  400 mg Oral QHS  . multivitamin  1 tablet Oral Daily  . pantoprazole  40 mg Oral Daily  . sevelamer carbonate  800 mg Oral TID WC  . sodium chloride  3 mL Intravenous Q12H  . tiotropium  18 mcg Inhalation Daily    Continuous Inpatient Infusions:     PRN Inpatient Medications:  acetaminophen **OR** acetaminophen, albuterol, HYDROcodone-acetaminophen, ondansetron **OR** ondansetron (ZOFRAN) IV  Miscellaneous:   Assessment:  1. Anemia and Hemoccult-positive stool. There's been apparently no melana or hematemesis. Stool has been reported as being brown. 2. Multiple medical issues including COPD/chronic respiratory failure, paroxysmal atrial fibrillation, history of anemia, ACD, with baseline approximately 8 to possibly 9 hemoglobin, end-stage renal disease, ejection fraction of 25-30% on echocardiogram April 2016 a second one in May 2016 showing a 45-50% ejection fraction.  Plan:  1. Patient has had 2 units of packed cells transfused possibly 3. He has been hemodynamically stable. He has just returned from hemodialysis. Patient at baseline is short of breath and would be a high risk for sedated procedure in light of his multiple medical problems. Recommend inserted management with twice a day PPI, will recheck Helicobacter pylori by stool antigen, serial hemoglobin, would consider CT scan of the abdomen and pelvis to rule out major lesions.  We'll follow with you  Lollie Sails MD 02/14/2015, 7:22 PM

## 2015-02-14 NOTE — Progress Notes (Signed)
Post hd tx 

## 2015-02-14 NOTE — Progress Notes (Signed)
Pre-hd tx 

## 2015-02-14 NOTE — Consult Note (Addendum)
Consultation  Referring Provider: Dr. Darvin Neighbours Primary Care Physician:  Alvester Morin, MD Consulting  Gastroenterologist:   Dr. Loistine Simas      Reason for Consultation: Anemia with heme positive stool            HPI:   [name] is a 74 y.o. male  with a  medical history of end-stage renal disease on hemodialysis, history of bladder cancer, chronic obstructive  uropathy status post bilateral ureteral stents with recurrent UTIs. He also has a history of prostate cancer treated with Lupron. He has COPD, O2 dependent , cardiac EF 35% -45%.  He was readmitted for a low hemoglobin and dark brown heme positive stool.  He states he was feeling fine and had no idea that his hemoglobin had fallen. He denies any GI complaints. He reports appetite and diet are good. He denies nausea, vomiting, and bowel habits are regular. He is unaware of any blood in the stool but states he never looks at it. Admission hemoglobin 5.9. He has received two units PRBCs and hemoglobin is up to 8.4 today.   He was admitted on 01/18/2015 with shortness of breath and was found to have atrial fibrillation with rapid ventricular rate and hypotension with altered mental status. He had urosepsis. He developed abdominal ileus and an NG tube was placed there was a tinge of blood in the gastric secretions. He had occult positive gastric. Dr. Gustavo Lah saw him in consultation. He was diagnosed with GI bleed was not thought to be a luminal candidate to compared to urosepsis. He was placed on IV Protonix for 3 days.  Helicobacter pylori was positive at 21.7 IgA, negative for IgG. He received two units of blood with a discharge hemoglobin  7.0. He was discharged on Protonix 40 mg daily. Therefore, he has been on Protonix just since last admission.  Previous colonoscopy June 2012 performed by Dr. Loistine Simas for indication change in bowel habits and diarrhea and was notable for poor preparation, diverticulosis and  biopsies were negative for microscopic colitis. No history of PUD or recent EGD.   Past Medical History  Diagnosis Date  . PAF (paroxysmal atrial fibrillation)     a. new onset 12/2014 in the setting of UTI, sepsis, hypotension, and anemia; b. not on long term anticoagulation given anemia; c. family aware of stroke risk, they are ok with this; d. on amiodarone   . Chronic combined systolic and diastolic CHF (congestive heart failure)     a. echo 12/2014: EF 25-30%, anterior wall wall motion abnormalities; b. planned ischemic evaluation once patient is stable medically; c. echo 01/2015: EF 45-50%, no RWMA, mild AT, LA mildly dilated, mod pericardial effusion along LV free wall, no evidence of hemodynamic compromise  . ESRD on hemodialysis     a. Tuesday, Thursday, and Saturdays  . Anemia     a. baseline hgb ~ 8  . History of small bowel obstruction     a. 01/2015  . History of septic shock     a. 01/2015  . GERD (gastroesophageal reflux disease)   . Cancer     Prostate, Bladder  . Allergic rhinitis   . COPD (chronic obstructive pulmonary disease)   . Hypercholesteremia   . Cognitive communication deficit   . Magnesium deficiency   . Dementia   . Bladder cancer   . Prostate cancer   . Chronic indwelling Foley catheter     Past Surgical History  Procedure Laterality Date  . Cystoscopy w/  ureteral stent placement    . Transurethral resection of bladder tumor with gyrus (turbt-gyrus)    . Ureteral stent placement      Family History  Problem Relation Age of Onset  . Family history unknown: Yes     History  Substance Use Topics  . Smoking status: Former Smoker -- 78 years    Quit date: 01/11/2015  . Smokeless tobacco: Never Used  . Alcohol Use: No    Prior to Admission medications   Medication Sig Start Date End Date Taking? Authorizing Provider  albuterol (PROVENTIL HFA;VENTOLIN HFA) 108 (90 BASE) MCG/ACT inhaler Inhale 2 puffs into the lungs every 4 (four) hours as needed  for wheezing or shortness of breath (Every 4 to 6 hours).   Yes Historical Provider, MD  albuterol (PROVENTIL) (2.5 MG/3ML) 0.083% nebulizer solution Take 2.5 mg by nebulization every 6 (six) hours as needed for wheezing or shortness of breath.   Yes Historical Provider, MD  amiodarone (PACERONE) 200 MG tablet Take 1 tablet (200 mg total) by mouth daily. 02/08/15  Yes Fritzi Mandes, MD  amLODipine (NORVASC) 5 MG tablet Take 5 mg by mouth daily.   Yes Historical Provider, MD  atorvastatin (LIPITOR) 40 MG tablet Take 1 tablet (40 mg total) by mouth daily. 02/03/15  Yes Fritzi Mandes, MD  budesonide-formoterol (SYMBICORT) 160-4.5 MCG/ACT inhaler Inhale 2 puffs into the lungs 2 (two) times daily.   Yes Historical Provider, MD  chlorpheniramine (CHLOR-TRIMETON) 4 MG tablet Take 4 mg by mouth every 4 (four) hours as needed for allergies.   Yes Historical Provider, MD  guaifenesin (ROBITUSSIN) 100 MG/5ML syrup Take 200 mg by mouth 3 (three) times daily as needed for cough.   Yes Historical Provider, MD  loperamide (IMODIUM A-D) 2 MG tablet Take 2 mg by mouth every 4 (four) hours as needed for diarrhea or loose stools.   Yes Historical Provider, MD  magnesium oxide (MAG-OX) 400 MG tablet Take 400 mg by mouth at bedtime.   Yes Historical Provider, MD  multivitamin (RENA-VIT) TABS tablet Take 1 tablet by mouth daily.   Yes Historical Provider, MD  Nutritional Supplements (FEEDING SUPPLEMENT, NEPRO CARB STEADY,) LIQD Take 237 mLs by mouth 2 (two) times daily between meals. 02/03/15  Yes Fritzi Mandes, MD  pantoprazole (PROTONIX) 40 MG tablet Take 1 tablet (40 mg total) by mouth daily. 02/03/15  Yes Fritzi Mandes, MD  sevelamer carbonate (RENVELA) 800 MG tablet Take 800 mg by mouth 3 (three) times daily with meals.    Yes Historical Provider, MD  Tiotropium Bromide Monohydrate 2.5 MCG/ACT AERS Inhale 1 puff into the lungs daily.   Yes Historical Provider, MD  amiodarone (PACERONE) 400 MG tablet Take 1 tablet (400 mg total) by  mouth 2 (two) times daily. Patient not taking: Reported on 02/13/2015 02/03/15   Fritzi Mandes, MD    Current Facility-Administered Medications  Medication Dose Route Frequency Provider Last Rate Last Dose  . 0.9 %  sodium chloride infusion  10 mL/hr Intravenous Once Harvest Dark, MD      . acetaminophen (TYLENOL) tablet 650 mg  650 mg Oral Q6H PRN Hillary Bow, MD       Or  . acetaminophen (TYLENOL) suppository 650 mg  650 mg Rectal Q6H PRN Srikar Sudini, MD      . albuterol (PROVENTIL) (2.5 MG/3ML) 0.083% nebulizer solution 2.5 mg  2.5 mg Nebulization Q2H PRN Srikar Sudini, MD      . amiodarone (PACERONE) tablet 200 mg  200 mg Oral  Daily Hillary Bow, MD   200 mg at 02/14/15 1217  . amLODipine (NORVASC) tablet 5 mg  5 mg Oral Daily Hillary Bow, MD   5 mg at 02/14/15 1000  . atorvastatin (LIPITOR) tablet 40 mg  40 mg Oral Daily Hillary Bow, MD   40 mg at 02/14/15 1000  . budesonide-formoterol (SYMBICORT) 160-4.5 MCG/ACT inhaler 2 puff  2 puff Inhalation BID Hillary Bow, MD   2 puff at 02/14/15 0935  . HYDROcodone-acetaminophen (NORCO/VICODIN) 5-325 MG per tablet 1-2 tablet  1-2 tablet Oral Q4H PRN Srikar Sudini, MD      . magnesium oxide (MAG-OX) tablet 400 mg  400 mg Oral QHS Srikar Sudini, MD   400 mg at 02/13/15 2210  . multivitamin (RENA-VIT) tablet 1 tablet  1 tablet Oral Daily Hillary Bow, MD   1 tablet at 02/14/15 1000  . ondansetron (ZOFRAN) tablet 4 mg  4 mg Oral Q6H PRN Hillary Bow, MD       Or  . ondansetron (ZOFRAN) injection 4 mg  4 mg Intravenous Q6H PRN Srikar Sudini, MD      . pantoprazole (PROTONIX) EC tablet 40 mg  40 mg Oral Daily Hillary Bow, MD   40 mg at 02/14/15 1000  . sevelamer carbonate (RENVELA) tablet 800 mg  800 mg Oral TID WC Srikar Sudini, MD   800 mg at 02/14/15 0800  . sodium chloride 0.9 % injection 3 mL  3 mL Intravenous Q12H Srikar Sudini, MD   3 mL at 02/14/15 1216  . tiotropium (SPIRIVA) inhalation capsule 18 mcg  18 mcg Inhalation Daily  Dustin Flock, MD   18 mcg at 02/14/15 1238    Allergies as of 02/13/2015 - Review Complete 02/13/2015  Allergen Reaction Noted  . Penicillins Hives 01/20/2015     Review of Systems:    A 12 system review was obtained and all negative except where noted in HPI.    Physical Exam:  Vital signs in last 24 hours: Temp:  [97.2 F (36.2 C)-97.8 F (36.6 C)] 97.7 F (36.5 C) (05/24 1250) Pulse Rate:  [74-80] 74 (05/24 1330) Resp:  [14-26] 25 (05/24 1330) BP: (102-147)/(48-98) 135/73 mmHg (05/24 1330) SpO2:  [95 %-100 %] 98 % (05/24 1330) Weight:  [93 kg (205 lb 0.4 oz)-98.068 kg (216 lb 3.2 oz)] 93 kg (205 lb 0.4 oz) (05/24 1250)    General:  Well-developed, well-nourished and in no acute distress Head:  Head without obvious abnormality, atraumatic  Eyes:   Conjunctiva pink, sclera anicteric   ENT:   Mouth free of lesions, mucosa moist, tongue pink, no thrush noted, teeth and gums normal Neck:   Supple w/o thyromegaly or mass, trachea midline, no adenopathy  Lungs: Clear to auscultation bilaterally, respirations unlabored Heart:     Normal S1S2, no rubs, murmurs, gallops. Abdomen: Soft, non-tender, no hepatosplenomegaly, hernia, or mass and BS normal Rectal: Deferred Lymph:  No cervical or supraclavicular adenopathy. Extremities:   No edema, cyanosis, or clubbing. He is undergoing hemodialysis during the evaluation Skin  Skin color, texture, turgor normal, no rashes or lesions Neuro:  A&O x 3. CNII-XII intact, normal strength Psych:  Appropriate mood and affect.  Data Reviewed:  LAB RESULTS:  Recent Labs  02/13/15 1632 02/14/15 0921  WBC 7.8 8.3  HGB 5.9* 8.4*  HCT 18.1* 24.9*  PLT 277 285   BMET  Recent Labs  02/14/15 0921  NA 141  K 4.0  CL 103  CO2 26  GLUCOSE 91  BUN  49*  CREATININE 5.64*  CALCIUM 8.3*   LFT No results for input(s): PROT, ALBUMIN, AST, ALT, ALKPHOS, BILITOT, BILIDIR, IBILI in the last 72 hours. PT/INR No results for input(s):  LABPROT, INR in the last 72 hours.  STUDIES: Dg Chest 2 View  02/13/2015   CLINICAL DATA:  Patient has had a dry cough for two days, he is hoarse when he speaks. Hx PAF, cancer, ESRD, GERD, COPD, CHF  EXAM: CHEST  2 VIEW  COMPARISON:  01/30/2015  FINDINGS: There has been improvement since the prior study. There are now only small pleural effusions there is mild dependent atelectasis. No convincing pulmonary edema. Lungs remain mildly hyperexpanded. There is mild enlargement of the cardiopericardial silhouette. Aorta is uncoiled. No mediastinal or hilar masses or evidence of adenopathy.  No pneumothorax.  IMPRESSION: 1. No acute cardiopulmonary disease. 2. Interval improvement from the prior study with decreased pleural fluid, most evident on the left, with only small residual pleural effusions with mild associated dependent atelectasis. No pulmonary edema.   Electronically Signed   By: Lajean Manes M.D.   On: 02/13/2015 19:18     Assessment:  [name] is a 74 y.o. male here for anemia and heme-positive stool. He has residual pleural effusions on today's on yesterday's chest x-ray. He is a history of COPD, O2 dependent, slight breathiness and purse lip breathing with talking. Lungs without by basilar crackles. No obvious cough today. He has a cardiac ejection fraction estimated at 35%-45%. Previous cardiology recommendation to keep  hemoglobin in the 8 range.  Plan:  Stool for H pylori , if positive that will be accurate and treatment will be needed. Since he is on PPI, it may be false neg. Dr. Loistine Simas to evaluate the patient to determine if he is a  candidate for luminal evaluation for EGD from a respiratory standpoint. He is hemodynamically stable.   This case was discussed with Dr. Loistine Simas in collaboration of care. Thank you for the consultation.  These services provided by Denice Paradise RN, MSN, ANP-BC under collaborative practice agreement with Loistine Simas, MD.   02/14/2015, 2:39 PM

## 2015-02-14 NOTE — Care Management Note (Signed)
Patient is active at [facility]  TTS schedule. I will send updated records to clinic at discharge.

## 2015-02-14 NOTE — Progress Notes (Signed)
Initial Nutrition Assessment  DOCUMENTATION CODES:     INTERVENTION: Medical Food Supplement Therapy: will recommend supplement once diet order able to be advanced    NUTRITION DIAGNOSIS:  Inadequate oral intake related to inability to eat as evidenced by NPO status.  GOAL:   (Goal for diet advancement and tolerance as medically able within 5-7 days)  MONITOR:   (Energy Intake, Anemia Profile, Digestive system, Electrolyte and Renal Profile, Anthropometrics, Digestive system)  REASON FOR ASSESSMENT:  Malnutrition Screening Tool  (RD notes MST from previous admission)  ASSESSMENT:  Pt admitted with GI bleed and low hemoglobin, s/p transfusion; GI consulted. Pt scheduled for HD today. PMHx:  Past Medical History  Diagnosis Date  . PAF (paroxysmal atrial fibrillation)     a. new onset 12/2014 in the setting of UTI, sepsis, hypotension, and anemia; b. not on long term anticoagulation given anemia; c. family aware of stroke risk, they are ok with this; d. on amiodarone   . Chronic combined systolic and diastolic CHF (congestive heart failure)     a. echo 12/2014: EF 25-30%, anterior wall wall motion abnormalities; b. planned ischemic evaluation once patient is stable medically; c. echo 01/2015: EF 45-50%, no RWMA, mild AT, LA mildly dilated, mod pericardial effusion along LV free wall, no evidence of hemodynamic compromise  . ESRD on hemodialysis     a. Tuesday, Thursday, and Saturdays  . Anemia     a. baseline hgb ~ 8  . History of small bowel obstruction     a. 01/2015  . History of septic shock     a. 01/2015  . GERD (gastroesophageal reflux disease)   . Cancer     Prostate, Bladder  . Allergic rhinitis   . COPD (chronic obstructive pulmonary disease)   . Hypercholesteremia   . Cognitive communication deficit   . Magnesium deficiency   . Dementia   . Bladder cancer   . Prostate cancer   . Chronic indwelling Foley catheter    PO Intake: pt very sleepy on visit, but  pt reports having not eaten 'all day' on visit. Pt has been NPO since admission. Pt reports good appetite PTA. Writer notes on last admission pt was started on TPN (5%AA/15%Dextrose at 83mL/hr without electrolytes), 2 weeks ago.  Medications: Renvela, MVI, Mag-Ox, Protonix Labs: Electrolyte and Renal Profile:    Recent Labs Lab 02/08/15 1650 02/14/15 0921  BUN 47* 49*  CREATININE 5.71* 5.64*  NA 141 141  K 3.9 4.0   Glucose Profile: No results for input(s): GLUCAP in the last 72 hours. Protein Profile:  Recent Labs Lab 02/08/15 1650  ALBUMIN 2.5*   CBC Latest Ref Rng 02/13/2015 02/13/2015 02/08/2015  WBC 3.8 - 10.6 K/uL 7.8 8.2 9.0  Hemoglobin 13.0 - 18.0 g/dL 5.9(L) 5.8(L) 7.1(L)  Hematocrit 40.0 - 52.0 % 18.1(L) 17.9(L) 21.0(L)  Platelets 150 - 440 K/uL 277 277 257    Pt weight ranging from 206-211lbs last admission, weight of 216lbs last night.  Height:  Ht Readings from Last 1 Encounters:  02/13/15 6\' 2"  (1.88 m)    Weight:  Wt Readings from Last 1 Encounters:  02/14/15 205 lb 0.4 oz (93 kg)    Ideal Body Weight:     Wt Readings from Last 10 Encounters:  02/14/15 205 lb 0.4 oz (93 kg)  02/08/15 211 lb (95.709 kg)  02/04/15 206 lb 12.7 oz (93.8 kg)    BMI:  Body mass index is 26.31 kg/(m^2).  Nutrition-Focused physical exam completed. Findings  are WDL for fat depletion, muscle depletion, and edema,although RD notes some mild muscle depletion of calf muscle.   Estimated Nutritional Needs:  Kcal:  2146-2576kcals, BEE: 1789kcals, TEE: (IF 1.0-1.2)(AF 1.2)   Protein:  117-147g protein (1.2-1.5g/kg)  Fluid:  UOP + 1067mL  Skin:  Reviewed, no issues  Diet Order:  Diet NPO time specified  EDUCATION NEEDS:  No education needs identified at this time   Intake/Output Summary (Last 24 hours) at 02/14/15 1321 Last data filed at 02/14/15 1244  Gross per 24 hour  Intake    725 ml  Output   2000 ml  Net  -1275 ml    Last BM:  5/24 smear  brown   MODERATE Care Level   Dwyane Luo, RD, LDN Pager [phone]

## 2015-02-15 LAB — CBC
HCT: 24 % — ABNORMAL LOW (ref 40.0–52.0)
Hemoglobin: 8 g/dL — ABNORMAL LOW (ref 13.0–18.0)
MCH: 28.8 pg (ref 26.0–34.0)
MCHC: 33.4 g/dL (ref 32.0–36.0)
MCV: 86.4 fL (ref 80.0–100.0)
Platelets: 254 10*3/uL (ref 150–440)
RBC: 2.78 MIL/uL — AB (ref 4.40–5.90)
RDW: 17.9 % — AB (ref 11.5–14.5)
WBC: 7 10*3/uL (ref 3.8–10.6)

## 2015-02-15 LAB — BASIC METABOLIC PANEL
ANION GAP: 9 (ref 5–15)
BUN: 23 mg/dL — AB (ref 6–20)
CALCIUM: 8.6 mg/dL — AB (ref 8.9–10.3)
CO2: 30 mmol/L (ref 22–32)
Chloride: 101 mmol/L (ref 101–111)
Creatinine, Ser: 3.4 mg/dL — ABNORMAL HIGH (ref 0.61–1.24)
GFR calc Af Amer: 19 mL/min — ABNORMAL LOW (ref >60)
GFR calc non Af Amer: 17 mL/min — ABNORMAL LOW (ref >60)
GLUCOSE: 82 mg/dL (ref 65–99)
Potassium: 3.8 mmol/L (ref 3.5–5.1)
SODIUM: 140 mmol/L (ref 135–145)

## 2015-02-15 LAB — IRON AND TIBC
Iron: 15 ug/dL — ABNORMAL LOW (ref 45–182)
Saturation Ratios: 6 % — ABNORMAL LOW (ref 17.9–39.5)
TIBC: 244 ug/dL — AB (ref 250–450)
UIBC: 229 ug/dL

## 2015-02-15 LAB — FERRITIN: FERRITIN: 581 ng/mL — AB (ref 24–336)

## 2015-02-15 NOTE — Progress Notes (Addendum)
Patient A/O, no noted distress. Denies pain. Patient able to make needs known. Chronic foley in place patent and secured. Tolerated meds. Staff will continue to meet patient needs and monitor. Patient was bathed this am. Patient slept well throughout the night.

## 2015-02-15 NOTE — Consult Note (Signed)
Subjective: Patient seen for anemia and Hemoccult-positive stool. He has been doing well since yesterday. There's been no evidence of acute GI bleeding. He denies any nausea vomiting or abdominal pain. He had several bowel movements yesterday although none of these were recorded as being bloody.  Objective: Vital signs in last 24 hours: Temp:  [97.8 F (36.6 C)-98.3 F (36.8 C)] 98 F (36.7 C) (05/25 0827) Pulse Rate:  [66-86] 66 (05/25 0827) Resp:  [18-31] 19 (05/25 0827) BP: (123-145)/(69-86) 133/76 mmHg (05/25 0827) SpO2:  [93 %-100 %] 100 % (05/25 0827) Weight:  [91.5 kg (201 lb 11.5 oz)-93.3 kg (205 lb 11 oz)] 91.5 kg (201 lb 11.5 oz) (05/25 0500) Blood pressure 133/76, pulse 66, temperature 98 F (36.7 C), temperature source Oral, resp. rate 19, height 6\' 2"  (1.88 m), weight 91.5 kg (201 lb 11.5 oz), SpO2 100 %.   Intake/Output from previous day: 05/24 0701 - 05/25 0700 In: 330 [I.V.:3; Blood:327] Out: 900 [Urine:900]  Intake/Output this shift:     General appearance:  Elderly-appearing male no acute distress. He just finished eating a regular diet lunch. Resp:  Clear to auscultation Cardio:  Regular rate and rhythm GI:  Soft nontender nondistended bowel sounds positive normoactive Extremities:  Trace edema   Lab Results: Results for orders placed or performed during the hospital encounter of 02/13/15 (from the past 24 hour(s))  CBC     Status: Abnormal   Collection Time: 02/15/15  5:35 AM  Result Value Ref Range   WBC 7.0 3.8 - 10.6 K/uL   RBC 2.78 (L) 4.40 - 5.90 MIL/uL   Hemoglobin 8.0 (L) 13.0 - 18.0 g/dL   HCT 24.0 (L) 40.0 - 52.0 %   MCV 86.4 80.0 - 100.0 fL   MCH 28.8 26.0 - 34.0 pg   MCHC 33.4 32.0 - 36.0 g/dL   RDW 17.9 (H) 11.5 - 14.5 %   Platelets 254 150 - 440 K/uL  Basic metabolic panel     Status: Abnormal   Collection Time: 02/15/15  5:35 AM  Result Value Ref Range   Sodium 140 135 - 145 mmol/L   Potassium 3.8 3.5 - 5.1 mmol/L   Chloride 101  101 - 111 mmol/L   CO2 30 22 - 32 mmol/L   Glucose, Bld 82 65 - 99 mg/dL   BUN 23 (H) 6 - 20 mg/dL   Creatinine, Ser 3.40 (H) 0.61 - 1.24 mg/dL   Calcium 8.6 (L) 8.9 - 10.3 mg/dL   GFR calc non Af Amer 17 (L) >60 mL/min   GFR calc Af Amer 19 (L) >60 mL/min   Anion gap 9 5 - 15  Iron and TIBC     Status: Abnormal   Collection Time: 02/15/15  5:35 AM  Result Value Ref Range   Iron 15 (L) 45 - 182 ug/dL   TIBC 244 (L) 250 - 450 ug/dL   Saturation Ratios 6 (L) 17.9 - 39.5 %   UIBC 229 ug/dL  Ferritin     Status: Abnormal   Collection Time: 02/15/15  5:35 AM  Result Value Ref Range   Ferritin 581 (H) 24 - 336 ng/mL      Recent Labs  02/13/15 1632 02/14/15 0921 02/15/15 0535  WBC 7.8 8.3 7.0  HGB 5.9* 8.4* 8.0*  HCT 18.1* 24.9* 24.0*  PLT 277 285 254   BMET  Recent Labs  02/14/15 0921 02/15/15 0535  NA 141 140  K 4.0 3.8  CL 103 101  CO2  26 30  GLUCOSE 91 82  BUN 49* 23*  CREATININE 5.64* 3.40*  CALCIUM 8.3* 8.6*   LFT No results for input(s): PROT, ALBUMIN, AST, ALT, ALKPHOS, BILITOT, BILIDIR, IBILI in the last 72 hours. PT/INR No results for input(s): LABPROT, INR in the last 72 hours. Hepatitis Panel No results for input(s): HEPBSAG, HCVAB, HEPAIGM, HEPBIGM in the last 72 hours. C-Diff No results for input(s): CDIFFTOX in the last 72 hours. No results for input(s): CDIFFPCR in the last 72 hours.   Studies/Results: Dg Chest 2 View  02/13/2015   CLINICAL DATA:  Patient has had a dry cough for two days, he is hoarse when he speaks. Hx PAF, cancer, ESRD, GERD, COPD, CHF  EXAM: CHEST  2 VIEW  COMPARISON:  01/30/2015  FINDINGS: There has been improvement since the prior study. There are now only small pleural effusions there is mild dependent atelectasis. No convincing pulmonary edema. Lungs remain mildly hyperexpanded. There is mild enlargement of the cardiopericardial silhouette. Aorta is uncoiled. No mediastinal or hilar masses or evidence of adenopathy.  No  pneumothorax.  IMPRESSION: 1. No acute cardiopulmonary disease. 2. Interval improvement from the prior study with decreased pleural fluid, most evident on the left, with only small residual pleural effusions with mild associated dependent atelectasis. No pulmonary edema.   Electronically Signed   By: Lajean Manes M.D.   On: 02/13/2015 19:18    Scheduled Inpatient Medications:   . amiodarone  200 mg Oral Daily  . amLODipine  5 mg Oral Daily  . atorvastatin  40 mg Oral Daily  . budesonide-formoterol  2 puff Inhalation BID  . magnesium oxide  400 mg Oral QHS  . multivitamin  1 tablet Oral Daily  . pantoprazole  40 mg Oral BID  . sevelamer carbonate  800 mg Oral TID WC  . sodium chloride  3 mL Intravenous Q12H  . tiotropium  18 mcg Inhalation Daily    Continuous Inpatient Infusions:     PRN Inpatient Medications:  acetaminophen **OR** acetaminophen, albuterol, HYDROcodone-acetaminophen, ondansetron **OR** ondansetron (ZOFRAN) IV  Miscellaneous:   Assessment:  1. Hemoccult-positive stool and anemia. he tolerated transfusion well. There is been evidence of ongoing or acute GI bleeding. He is currently on a twice a day oral PPI. His respiratory status and morbidities making him high risk for sedated procedure. Awaiting factor pylori stool antigen.  Plan:  1. As above  Lollie Sails MD 02/15/2015, 2:05 PM

## 2015-02-15 NOTE — Clinical Social Work Note (Signed)
Clinical Social Work Assessment  Patient Details  Name: [name] MRN: [id number] Date of Birth: July 10, 1941  Date of referral:  02/13/15               Reason for consult:  Facility Placement                Permission sought to share information with:  Family Supports, Chartered certified accountant granted to share information::  Yes, Verbal Permission Granted  Name::        Agency::     Relationship::     Contact Information:     Housing/Transportation Living arrangements for the past 2 months:  [place] of Information:  Patient Patient Interpreter Needed:  None Criminal Activity/Legal Involvement Pertinent to Current Situation/Hospitalization:  No - Comment as needed Significant Relationships:  Other Family Members Lives with:  Facility Resident Do you feel safe going back to the place where you live?  Yes Need for family participation in patient care:  No (Coment)  Care giving concerns:  none   Facilities manager / plan:  CSW spoke with patient who did not remember me from his last admission, but did remember that his cousin [name] Cousin: [phone]) worked with the hospital to get him into rehab last admission at [facility]. Patient states that his rehab has been going well and that he wishes to return at discharge. Patient states that he does not intend to need to stay there long term but rather is wanting to complete his rehab process then return home. Patient states that his dialysis is going well and that he was actually able to sleep through most of it yesterday. Fl2 placed on chart and message sent to [facility] to ensure patient can return when time.   Employment status:  Disabled (Comment on whether or not currently receiving Disability) Insurance information:  Managed Medicare PT Recommendations:  Not assessed at this time Information / Referral to community resources:     Patient/Family's Response to care:   Pleasant and cooperative  Patient/Family's Understanding of and Emotional Response to Diagnosis, Current Treatment, and Prognosis:  Patient understands that he needs a higher level of care right now and cannot return home at this time.   Emotional Assessment Appearance:  Appears stated age Attitude/Demeanor/Rapport:   (pleasant and cooperative) Affect (typically observed):  Appropriate Orientation:  Oriented to Self, Oriented to Place, Oriented to  Time, Oriented to Situation Alcohol / Substance use:  Not Applicable Psych involvement (Current and /or in the community):  No (Comment)  Discharge Needs  Concerns to be addressed:  Other (Comment Required (Patient to return to [facility] at discharge) Readmission within the last 30 days:  No Current discharge risk:  None Barriers to Discharge:  No Barriers Identified   [name], LCSW 02/15/2015, 12:04 PM

## 2015-02-15 NOTE — Progress Notes (Signed)
Subjective:   Patient admitted for eval of low hemoglobin. Level was 5.9. 2 units of blood transfusion overnight.  Pt denies any melena or coughing/vomiting blood Dialyzed yesterday.    Objective:  Vital signs in last 24 hours:  Temp:  [97.7 F (36.5 C)-98.3 F (36.8 C)] 98 F (36.7 C) (05/25 0827) Pulse Rate:  [66-86] 66 (05/25 0827) Resp:  [18-31] 19 (05/25 0827) BP: (123-145)/(69-86) 133/76 mmHg (05/25 0827) SpO2:  [93 %-100 %] 100 % (05/25 0827) Weight:  [91.5 kg (201 lb 11.5 oz)-93.3 kg (205 lb 11 oz)] 91.5 kg (201 lb 11.5 oz) (05/25 0500)  Weight change: -2.256 kg (-4 lb 15.6 oz) Filed Weights   02/14/15 1655 02/15/15 0005 02/15/15 0500  Weight: 93.3 kg (205 lb 11 oz) 93.2 kg (205 lb 7.5 oz) 91.5 kg (201 lb 11.5 oz)    Intake/Output: I/O last 3 completed shifts: In: 728 [I.V.:23; Blood:705] Out: 2300 [Urine:2300]   Intake/Output this shift:     General: not in acute distress Neck: supple CVS: irregular rate and rhythm Resp: clear  ABD: +soft,  EXT: no edema Neuro: alert and oriented Access: Left arm AVF   Basic Metabolic Panel:  Recent Labs Lab 02/08/15 1650 02/14/15 0921 02/15/15 0535  NA 141 141 140  K 3.9 4.0 3.8  CL 100* 103 101  CO2 30 26 30   GLUCOSE 95 91 82  BUN 47* 49* 23*  CREATININE 5.71* 5.64* 3.40*  CALCIUM 8.2* 8.3* 8.6*    Liver Function Tests:  Recent Labs Lab 02/08/15 1650  AST 20  ALT 13*  ALKPHOS 88  BILITOT 0.5  PROT 7.2  ALBUMIN 2.5*   No results for input(s): LIPASE, AMYLASE in the last 168 hours. No results for input(s): AMMONIA in the last 168 hours.  CBC:  Recent Labs Lab 02/08/15 1650 02/13/15 1315 02/13/15 1632 02/14/15 0921 02/15/15 0535  WBC 9.0 8.2 7.8 8.3 7.0  NEUTROABS 5.4 5.6  --  5.8  --   HGB 7.1* 5.8* 5.9* 8.4* 8.0*  HCT 21.0* 17.9* 18.1* 24.9* 24.0*  MCV 86.1 87.2 87.5 87.4 86.4  PLT 257 277 277 285 254    Cardiac Enzymes: No results for input(s): CKTOTAL, CKMB, CKMBINDEX,  TROPONINI in the last 168 hours.  BNP: Invalid input(s): POCBNP  CBG: No results for input(s): GLUCAP in the last 168 hours.  Microbiology: Results for orders placed or performed during the hospital encounter of 01/18/15  Culture, blood (single)     Status: None (Preliminary result)   Collection Time: 01/18/15  7:11 PM  Result Value Ref Range Status   Micro Text Report   Preliminary       COMMENT                   NO GROWTH IN 5 HOURS   ANTIBIOTIC                                                      Culture, blood (single)     Status: None (Preliminary result)   Collection Time: 01/18/15  7:11 PM  Result Value Ref Range Status   Micro Text Report   Preliminary       COMMENT                   NO GROWTH IN 48  HOURS   ANTIBIOTIC                                                      Urine culture     Status: None   Collection Time: 01/18/15  7:36 PM  Result Value Ref Range Status   Micro Text Report   Final       SOURCE: INDWELLING CATHETER    COMMENT                   NO GROWTH IN 36 HOURS   ANTIBIOTIC                                                      Urine culture     Status: None   Collection Time: 01/20/15  4:09 PM  Result Value Ref Range Status   Micro Text Report   Final       SOURCE: INDWELLING CATH    ORGANISM 1                2000 CFU/ML ENTEROBACTER SPECIES   COMMENT                   UNABLE TO FURTHER SPECIATE   COMMENT                   -   ANTIBIOTIC                    ORG#1     CEFAZOLIN                     R         CEFOXITIN                     R         CEFTRIAXONE                   R         CIPROFLOXACIN                 I         ERTAPENEM                     S         GENTAMICIN                    S         IMIPENEM                      S         LEVOFLOXACIN                  I         NITROFURANTOIN                I         TRIMETH/SULFA                 S  Coagulation Studies: No results for input(s): LABPROT, INR in  the last 72 hours.  Urinalysis: No results for input(s): COLORURINE, LABSPEC, PHURINE, GLUCOSEU, HGBUR, BILIRUBINUR, KETONESUR, PROTEINUR, UROBILINOGEN, NITRITE, LEUKOCYTESUR in the last 72 hours.  Invalid input(s): APPERANCEUR    Imaging: Dg Chest 2 View  02/13/2015   CLINICAL DATA:  Patient has had a dry cough for two days, he is hoarse when he speaks. Hx PAF, cancer, ESRD, GERD, COPD, CHF  EXAM: CHEST  2 VIEW  COMPARISON:  01/30/2015  FINDINGS: There has been improvement since the prior study. There are now only small pleural effusions there is mild dependent atelectasis. No convincing pulmonary edema. Lungs remain mildly hyperexpanded. There is mild enlargement of the cardiopericardial silhouette. Aorta is uncoiled. No mediastinal or hilar masses or evidence of adenopathy.  No pneumothorax.  IMPRESSION: 1. No acute cardiopulmonary disease. 2. Interval improvement from the prior study with decreased pleural fluid, most evident on the left, with only small residual pleural effusions with mild associated dependent atelectasis. No pulmonary edema.   Electronically Signed   By: Lajean Manes M.D.   On: 02/13/2015 19:18     Medications:     . amiodarone  200 mg Oral Daily  . amLODipine  5 mg Oral Daily  . atorvastatin  40 mg Oral Daily  . budesonide-formoterol  2 puff Inhalation BID  . magnesium oxide  400 mg Oral QHS  . multivitamin  1 tablet Oral Daily  . pantoprazole  40 mg Oral BID  . sevelamer carbonate  800 mg Oral TID WC  . sodium chloride  3 mL Intravenous Q12H  . tiotropium  18 mcg Inhalation Daily   acetaminophen **OR** acetaminophen, albuterol, HYDROcodone-acetaminophen, ondansetron **OR** ondansetron (ZOFRAN) IV  Assessment/ Plan:  74 y.o. 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 Hemodialysis  - Continue TTS schedule.   -Will schedule dialysis tomorrow  2. Anemia of chronic kidney disease: D63.1  - holding epo due to prostate cancer.  - transfusion on 5/5 and 5/6  - 2 units given overnight  - GI evaluation ongoing  - heme positive stools  3. Secondary Hyperparathyroidism: N25.81  - continue low dose renvela      LOS: 2 Dodd Schmid 5/25/201611:08 AM

## 2015-02-15 NOTE — Progress Notes (Signed)
Patient A/O, no noted distress. Denies pain . Patient continues to be NPO. Held meds resulted to NPO status. Chronic foley in place, patent, and secured. Patient was turned every 2 hours. He remains on isolation. Room and equipment was sanitized as per policy.Staff will continue to monitor and meet needs.

## 2015-02-16 LAB — CBC
HEMATOCRIT: 24.2 % — AB (ref 40.0–52.0)
Hemoglobin: 8.1 g/dL — ABNORMAL LOW (ref 13.0–18.0)
MCH: 29.3 pg (ref 26.0–34.0)
MCHC: 33.5 g/dL (ref 32.0–36.0)
MCV: 87.4 fL (ref 80.0–100.0)
Platelets: 251 10*3/uL (ref 150–440)
RBC: 2.77 MIL/uL — ABNORMAL LOW (ref 4.40–5.90)
RDW: 17.7 % — ABNORMAL HIGH (ref 11.5–14.5)
WBC: 7.7 10*3/uL (ref 3.8–10.6)

## 2015-02-16 LAB — TYPE AND SCREEN
ABO/RH(D): A NEG
ANTIBODY SCREEN: NEGATIVE
UNIT DIVISION: 0
UNIT DIVISION: 0

## 2015-02-16 LAB — MRSA PCR SCREENING: MRSA by PCR: POSITIVE — AB

## 2015-02-16 NOTE — Consult Note (Signed)
Subjective: Patient seen for anemia and Hemoccult-positive stool. She is now on a proton pump inhibitor twice a day having started that initially on his hospitalization several weeks ago. He denies any nausea or vomiting. He is tolerating regular foods. He had a bowel movement yesterday, recorded without mention of blood in the stool. He denies any abdominal pain.  Objective: Vital signs in last 24 hours: Temp:  [97.8 F (36.6 C)-98.7 F (37.1 C)] 98.2 F (36.8 C) (05/26 1357) Pulse Rate:  [61-74] 71 (05/26 1357) Resp:  [18-20] 18 (05/26 1357) BP: (112-130)/(61-76) 117/61 mmHg (05/26 1357) SpO2:  [96 %-100 %] 100 % (05/26 1357) Weight:  [90.2 kg (198 lb 13.7 oz)-90.7 kg (199 lb 15.3 oz)] 90.7 kg (199 lb 15.3 oz) (05/26 1321) Blood pressure 117/61, pulse 71, temperature 98.2 F (36.8 C), temperature source Oral, resp. rate 18, height 6\' 2"  (1.88 m), weight 90.7 kg (199 lb 15.3 oz), SpO2 100 %.   Intake/Output from previous day: 05/25 0701 - 05/26 0700 In: 39 [P.O.:237] Out: 900 [Urine:900]  Intake/Output this shift:     General appearance:  Elderly-appearing 74 year old male no acute distress with intermittent shortness of breath. Resp:  Clear to auscultation Cardio:  Regular rate and rhythm GI:  Soft nontender nondistended bowel sounds positive normoactive Extremities:  No clubbing cyanosis or edema   Lab Results: Results for orders placed or performed during the hospital encounter of 02/13/15 (from the past 24 hour(s))  CBC     Status: Abnormal   Collection Time: 02/16/15  6:10 AM  Result Value Ref Range   WBC 7.7 3.8 - 10.6 K/uL   RBC 2.77 (L) 4.40 - 5.90 MIL/uL   Hemoglobin 8.1 (L) 13.0 - 18.0 g/dL   HCT 24.2 (L) 40.0 - 52.0 %   MCV 87.4 80.0 - 100.0 fL   MCH 29.3 26.0 - 34.0 pg   MCHC 33.5 32.0 - 36.0 g/dL   RDW 17.7 (H) 11.5 - 14.5 %   Platelets 251 150 - 440 K/uL  MRSA PCR Screening     Status: Abnormal   Collection Time: 02/16/15  9:45 AM  Result Value Ref  Range   MRSA by PCR POSITIVE (A) NEGATIVE      Recent Labs  02/14/15 0921 02/15/15 0535 02/16/15 0610  WBC 8.3 7.0 7.7  HGB 8.4* 8.0* 8.1*  HCT 24.9* 24.0* 24.2*  PLT 285 254 251   BMET  Recent Labs  02/14/15 0921 02/15/15 0535  NA 141 140  K 4.0 3.8  CL 103 101  CO2 26 30  GLUCOSE 91 82  BUN 49* 23*  CREATININE 5.64* 3.40*  CALCIUM 8.3* 8.6*   LFT No results for input(s): PROT, ALBUMIN, AST, ALT, ALKPHOS, BILITOT, BILIDIR, IBILI in the last 72 hours. PT/INR No results for input(s): LABPROT, INR in the last 72 hours. Hepatitis Panel No results for input(s): HEPBSAG, HCVAB, HEPAIGM, HEPBIGM in the last 72 hours. C-Diff No results for input(s): CDIFFTOX in the last 72 hours. No results for input(s): CDIFFPCR in the last 72 hours.   Studies/Results: No results found.  Scheduled Inpatient Medications:   . amiodarone  200 mg Oral Daily  . amLODipine  5 mg Oral Daily  . atorvastatin  40 mg Oral Daily  . budesonide-formoterol  2 puff Inhalation BID  . magnesium oxide  400 mg Oral QHS  . multivitamin  1 tablet Oral Daily  . pantoprazole  40 mg Oral BID  . sevelamer carbonate  800 mg Oral TID  WC  . sodium chloride  3 mL Intravenous Q12H  . tiotropium  18 mcg Inhalation Daily    Continuous Inpatient Infusions:     PRN Inpatient Medications:  acetaminophen **OR** acetaminophen, albuterol, HYDROcodone-acetaminophen, ondansetron **OR** ondansetron (ZOFRAN) IV  Miscellaneous:   Assessment:  1. Hemoccult-positive stool and anemia. Anemia is likely multifactorial with CK D/hemodialysis and some GI blood loss. Patient has been hemodynamically stable.  Plan:  1. Continue the ID by mouth PPI as an outpatient. Awaiting report of H. pylori stool antigen.  Lollie Sails MD 02/16/2015, 3:19 PM

## 2015-02-16 NOTE — Progress Notes (Signed)
[place] [facility]                                                                                                                                                                                            Patient Demographics   [name], is a 74 y.o. male, DOB - March 08, 1941, GMW:[id number]  Admit date - 02/13/2015   Admitting Physician Hillary Bow, MD  Outpatient Primary MD for the patient is Alvester Morin, MD  LOS - 3  Chief Complaint  Patient presents with  . Anemia      Patient feels okay he has chronic shortness of breath  Review of Systems:   CONSTITUTIONAL: No documented fever. No fatigue, weakness. No weight gain, no weight loss.  EYES: No blurry or double vision.  ENT: No tinnitus. No postnasal drip. No redness of the oropharynx.  RESPIRATORY: No cough, no wheeze, no hemoptysis. Chronic shortness of breath.  CARDIOVASCULAR: No chest pain. No orthopnea. No palpitations. No syncope.  GASTROINTESTINAL: No nausea, no vomiting or diarrhea. No abdominal pain. No melena or hematochezia.  GENITOURINARY: No dysuria or hematuria.  ENDOCRINE: No polyuria or nocturia. No heat or cold intolerance.  HEMATOLOGY: Positive anemia. No bruising. No bleeding.  INTEGUMENTARY: No rashes. No lesions.  MUSCULOSKELETAL: No arthritis. No swelling. No gout.  NEUROLOGIC: No numbness, tingling, or ataxia. No seizure-type activity.  PSYCHIATRIC: No anxiety. No insomnia. No ADD.    Vitals:   Filed Vitals:   02/16/15 1100 02/16/15 1130 02/16/15 1200 02/16/15 1230  BP:      Pulse: 71 71 68 63  Temp:      TempSrc:      Resp:      Height:      Weight:      SpO2:        Wt Readings from Last 3 Encounters:  02/16/15 90.2 kg (198 lb 13.7 oz)  02/08/15 95.709 kg (211 lb)  02/04/15 93.8 kg (206 lb 12.7 oz)     Intake/Output Summary (Last 24 hours) at 02/16/15 1244 Last data filed at 02/16/15 0400  Gross per 24 hour  Intake    237  ml  Output    900 ml  Net   -663 ml    Physical Exam:   GENERAL: Pleasant-appearing in no apparent distress.  HEAD, EYES, EARS, NOSE AND THROAT: Atraumatic, normocephalic. Extraocular muscles are intact. Pupils equal and reactive to light. Sclerae anicteric. No conjunctival injection. No oro-pharyngeal erythema.  NECK: Supple. There is no jugular venous distention. No bruits, no lymphadenopathy, no thyromegaly.  HEART: Regular rate and rhythm, tachycardic. No murmurs,  no rubs, no clicks.  LUNGS: Clear to auscultation bilaterally. No rales or rhonchi. No wheezes.  ABDOMEN: Soft, flat, nontender, nondistended. Has good bowel sounds. No hepatosplenomegaly appreciated.  EXTREMITIES: No evidence of any cyanosis, clubbing, or peripheral edema.  +2 pedal and radial pulses bilaterally.  NEUROLOGIC: The patient is alert, awake, and oriented x3 with no focal motor or sensory deficits appreciated bilaterally.  SKIN: Moist and warm with no rashes appreciated.  Psych: Not anxious, depressed LN: No inguinal LN enlargement    Antibiotics   Anti-infectives    None      Medications   Scheduled Meds: . amiodarone  200 mg Oral Daily  . amLODipine  5 mg Oral Daily  . atorvastatin  40 mg Oral Daily  . budesonide-formoterol  2 puff Inhalation BID  . magnesium oxide  400 mg Oral QHS  . multivitamin  1 tablet Oral Daily  . pantoprazole  40 mg Oral BID  . sevelamer carbonate  800 mg Oral TID WC  . sodium chloride  3 mL Intravenous Q12H  . tiotropium  18 mcg Inhalation Daily   Continuous Infusions:  PRN Meds:.acetaminophen **OR** acetaminophen, albuterol, HYDROcodone-acetaminophen, ondansetron **OR** ondansetron (ZOFRAN) IV   Data Review:   Micro Results No results found for this or any previous visit (from the past 240 hour(s)).  Radiology Reports Dg Chest 2 View  02/13/2015   CLINICAL DATA:  Patient has had a dry cough for two days, he is hoarse when he speaks. Hx PAF, cancer, ESRD, GERD,  COPD, CHF  EXAM: CHEST  2 VIEW  COMPARISON:  01/30/2015  FINDINGS: There has been improvement since the prior study. There are now only small pleural effusions there is mild dependent atelectasis. No convincing pulmonary edema. Lungs remain mildly hyperexpanded. There is mild enlargement of the cardiopericardial silhouette. Aorta is uncoiled. No mediastinal or hilar masses or evidence of adenopathy.  No pneumothorax.  IMPRESSION: 1. No acute cardiopulmonary disease. 2. Interval improvement from the prior study with decreased pleural fluid, most evident on the left, with only small residual pleural effusions with mild associated dependent atelectasis. No pulmonary edema.   Electronically Signed   By: Lajean Manes M.D.   On: 02/13/2015 19:18   Dg Chest 2 View  01/30/2015   CLINICAL DATA:  Shortness of breath  EXAM: CHEST  2 VIEW  COMPARISON:  CT chest dated 01/26/2015  FINDINGS: Patchy bilateral lower lobe opacities, likely atelectasis. Moderate left pleural effusion. No pneumothorax.  Right IJ venous catheter terminates in the mid SVC.  Cardiomegaly with pericardial effusion on prior CT.  Enteric tube courses below the diaphragm.  IMPRESSION: Moderate left pleural effusion with bilateral lower lobe atelectasis.  Cardiomegaly with pericardial effusion on prior CT.   Electronically Signed   By: Julian Hy M.D.   On: 01/30/2015 08:47   Dg Chest 2 View  01/26/2015   CLINICAL DATA:  CURRENT PNA, SBO, PT HAS NG TUBE, PT IS ON DIALYSIS AND HAS HX OF PAF AND CHF  EXAM: CHEST  2 VIEW  COMPARISON:  01/18/2015  FINDINGS: Moderate enlargement of the cardiopericardial silhouette. Small left and probable small right pleural effusions.  No mediastinal or hilar masses.  Mild lung base opacity, most likely atelectasis. On the left, pneumonia should be considered likely if there are consistent clinical symptoms.  No pulmonary edema.  No pneumothorax.  Nasogastric tube, new, passes below the diaphragm into the stomach.   Right internal jugular central venous line has its tip in the  upper superior vena cava, which appears retracted superiorly from the previous exam.  IMPRESSION: 1. Stable cardiomegaly. 2. Lung base opacity, left greater than right. There is a small left pleural effusion and a probable small right pleural effusion. Additional lung base opacity is likely atelectasis. Pneumonia in the left is possible P the appearance is similar to the most recent prior exam. 3. No pulmonary edema.  No new lung abnormalities. 4. Right IJ central venous line appears retracted. Tip now projects in the upper superior vena cava. 5. Nasogastric tube passes below the diaphragm into the stomach, new from the prior study.   Electronically Signed   By: Lajean Manes M.D.   On: 01/26/2015 14:41   Dg Abd 1 View  02/02/2015   CLINICAL DATA:  Ileus, followup  EXAM: ABDOMEN - 1 VIEW  COMPARISON:  Abdomen films of 01/31/2015  FINDINGS: There is both large and small bowel gas present without significant distention noted. Bilateral double-J ureteral stents remain. Iliac artery stents also are noted as well as coil embolization of the internal iliac arteries bilaterally. There is some opacity at the left lung base not well evaluated on this abdomen film.  IMPRESSION: No bowel obstruction or significant ileus.   Electronically Signed   By: Ivar Drape M.D.   On: 02/02/2015 08:17   Dg Abd 1 View  01/27/2015   CLINICAL DATA:  Ileus, abdominal pain  EXAM: ABDOMEN - 1 VIEW  COMPARISON:  01/26/2015  FINDINGS: Tip of nasogastric tube projects over gastric antrum.  Prior endoluminal stenting of abdominal aorta and iliac arteries.  BILATERAL ureteral stents present.  Dilatation of small bowel loops without bowel wall thickening.  Scattered gas within nondistended colon and rectum.  Bones diffusely demineralized.  Atelectasis versus consolidation LEFT lower lobe.  IMPRESSION: Diffuse small bowel dilatation, question ileus though obstruction not excluded ;  radiographic followup as clinically indicated.  Atelectasis versus consolidation LEFT lower lobe.   Electronically Signed   By: Lavonia Dana M.D.   On: 01/27/2015 11:10   Dg Abd 1 View  01/26/2015   CLINICAL DATA:  Increased abdominal distension  EXAM: ABDOMEN - 1 VIEW  COMPARISON:  None.  FINDINGS: There is gaseous distention of small bowel and colon. There is no evidence of pneumoperitoneum, portal venous gas or pneumatosis. There are no pathologic calcifications along the expected course of the ureters. There are bilateral ureteral stents. There is an aorto bi-iliac stent graft present. There are embolization coils which are likely within the bilateral internal iliac arteries. 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  IMPRESSION: Gaseous distention of small bowel colon concerning for an ileus.   Electronically Signed   By: Kathreen Devoid   On: 01/26/2015 21:21   Dg Abd 1 View  01/26/2015   CLINICAL DATA:  Small-bowel obstruction.  EXAM: ABDOMEN - 1 VIEW  COMPARISON:  01/25/2015.  FINDINGS: NG tube tip projected over the upper stomach. Bilateral double-J ureteral stents. Aorto iliac stent graft. Hypogastric coils noted. Mild improvement of small bowel distention. Small bowel distention however remains. No free air. Persistent left lower lobe atelectasis and/or infiltrate with left pleural effusion.  IMPRESSION: 1. NG tube tip projected over the upper stomach. Again advancement of approximately 10 cm is suggested to ensure that the proximal port lies below the GE junction. 2. Persistent but slightly improved small bowel distention. 3. Persistent left lower lobe atelectasis and/or infiltrate with left pleural effusion.  These results will be called to the ordering clinician or representative  by the Radiologist Assistant, and communication documented in the PACS or zVision Dashboard.   Electronically Signed   By: Marcello Moores  Register   On: 01/26/2015 07:29   Dg Abd 1 View  01/25/2015   CLINICAL DATA:   Abdominal pain, improving symptoms  EXAM: ABDOMEN - 1 VIEW  COMPARISON:  Supine abdominal film dated Jan 24, 2015  FINDINGS: There has been interval placement of a nasogastric tube. The proximal port lies at or just below the level of the GE junction. There remain loops of mildly distended gas-filled small bowel. The caliber of the colon is normal. There small amount of rectal gas. Bilateral double-J ureteral stents are unchanged in position. There is an aorto bi-iliac stent graft in place. There are metallic coils in the pelvis likely from previous embolization procedures.  IMPRESSION: There has been slight interval increase in the volume of small bowel gas consistent with a mid to distal small bowel obstruction. Advancement of the nasogastric tube by 10 cm is recommended to assure that the proximal port lies below the GE junction.   Electronically Signed   By: David  Martinique M.D.   On: 01/25/2015 07:53   Dg Abd 1 View  01/24/2015   CLINICAL DATA:  Ileus.  EXAM: ABDOMEN - 1 VIEW  COMPARISON:  01/23/2015.  01/22/2015.  FINDINGS: Bilateral double-J ureteral stents noted. Prior aortoiliac stent graft placement with bilateral hypogastric coils. Surgical clips in the abdomen and pelvis. Dilated loops of small bowel noted. These have slightly improved. Colon is nondistended. Oral contrast is present in the colon. No free air. Vascular calcification. Left lower lobe atelectasis and/or infiltrate with small left pleural effusion. Cardiomegaly. Diffuse osteopenia. Degenerative changes lumbar spine and both hips.  IMPRESSION: 1. Dilated loops of small bowel noted. Small bowel obstruction cannot be excluded . Slight interim improvement. 2. Bilateral double-J ureteral stents in good anatomic position. 3. Aortoiliac stent graft noted. 4. Left lower lobe atelectasis and/or infiltrate with small left pleural effusion. 5. Cardiomegaly.   Electronically Signed   By: Marcello Moores  Register   On: 01/24/2015 08:27   Dg Abd 1  View  01/23/2015   CLINICAL DATA:  Abdominal pain, nausea, abdominal distension, ileus  EXAM: ABDOMEN - 1 VIEW  COMPARISON:  01/22/2015  FINDINGS: Pelvic embolization coils.  Aortoiliac stenting.  BILATERAL ureteral stents.  Persistent dilatation of small bowel loops question ileus versus small bowel obstruction, with slightly less small bowel gas and on the previous study.  No definite bowel wall thickening.  Bones diffusely demineralized.  IMPRESSION: Slightly decreased small bowel gaseous distention since previous exam, question ileus versus small bowel obstruction.   Electronically Signed   By: Lavonia Dana M.D.   On: 01/23/2015 14:11   Dg Abd 1 View  01/22/2015   CLINICAL DATA:  74 year old male with a history of abdominal pain. Nausea  EXAM: ABDOMEN - 1 VIEW  COMPARISON:  01/21/2015, 01/18/2015  FINDINGS: Two frontal supine images.  Multiple distended small bowel loops, as was seen on the comparison study. Paucity of colonic gas. Small amount of retained enteric contrast within the left colon. The enteric contrast was not present on the comparison plain film study, and has progressed through the colon in the last 24 hours.  No definite evidence of free air.  Diffuse osteopenia.  No acute fracture.  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 Changes of bilateral hypogastric artery coiling.  Wire-less pressure monitor to the left lateral aspect  of the endograft.  Bilateral double-J ureteral stents in place.  IMPRESSION: Persisting dilated small bowel loops, either an ileus or small-bowel obstruction. If there is concern for acute intra abdominal process, CT would be indicated.  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 A wireless intrasac pressure monitor (CardioMEMES device) projects to the left aspect of the graft.  Unchanged position of bilateral double-J ureteral stents.   Signed,  Dulcy Fanny. Earleen Newport, DO  [facility]  [facility]   Electronically Signed   By: Corrie Mckusick D.O.   On: 01/22/2015 09:40   Dg Abd 1 View  01/21/2015   CLINICAL DATA:  Abdominal pain  EXAM: ABDOMEN - 1 VIEW  COMPARISON:  CT abdomen and pelvis January 18, 2015  FINDINGS: There are double-J stents bilaterally. There is also an aorto bi-iliac stent for aneurysm. There is generalized bowel dilatation in a pattern suggesting ileus. No free air is seen on this supine examination. There are no air-fluid levels. There is atelectatic change in the lung bases. There are coils in the pelvis, presumably due to internal iliac artery embolization.  IMPRESSION: Bowel gas pattern most consistent with ileus. No free air seen. Double-J stents present as well as aortoiliac stent. Coils are present in the pelvis as well.   Electronically Signed   By: Lowella Grip III M.D.   On: 01/21/2015 10:12   Ct Chest Wo Contrast  01/26/2015   CLINICAL DATA:  Encephalopathy, shortness of breath  EXAM: CT CHEST WITHOUT CONTRAST  TECHNIQUE: Multidetector CT imaging of the chest was performed following the standard protocol without IV contrast.  COMPARISON:  None.  FINDINGS: The central airways are patent. There are small bilateral pleural effusions, left greater than right. There is no focal consolidation. There is mild bibasilar atelectasis. There is no pneumothorax.  There are no pathologically enlarged axillary, hilar or mediastinal lymph nodes.  The heart size is normal. There is a moderate pericardial effusion measuring 3.2 cm along the left ventricle. 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 There is multi vessel coronary artery atherosclerosis involving the left main, lad, circumflex and RCA.  Review of bone windows demonstrates no focal lytic or sclerotic lesions.  Limited non-contrast images of the upper abdomen were obtained. There are  2 hypodense nodules arising from the right adrenal gland measuring 14 mm and 18 mm respectively most consistent with adrenal adenomas. There is nodularity of the left adrenal gland. There is a nasogastric tube with the tip in the stomach. Partially visualized is left hydronephrosis.  IMPRESSION: 1. Bilateral small pleural effusions, left greater than right with bibasilar atelectasis. 2. Interval development of a moderate pericardial effusion measuring 3.2 cm along the left lateral ventricle. 3. Multi vessel coronary artery atherosclerosis. 4. Distal aortic arch measures 4.7 cm in diameter. Recommend semi-annual imaging followup by CTA or MRA and referral to cardiothoracic surgery if not already obtained. This recommendation follows 2010 ACCF/AHA/AATS/ACR/ASA/SCA/SCAI/SIR/STS/SVM Guidelines for the Diagnosis and Management of Patients With Thoracic Aortic Disease. Circulation. 2010; 121: [id number]   Electronically Signed   By: Kathreen Devoid   On: 01/26/2015 21:20   US Renal  01/30/2015   CLINICAL DATA:  Subsequent encounter for hydronephrosis  EXAM: RENAL / URINARY TRACT ULTRASOUND COMPLETE  COMPARISON:  CT scan from 01/18/2015.  FINDINGS: Right Kidney:  Length: 11.5 cm. Collecting system fullness noted without overt hydronephrosis.  Left Kidney:  Length: 11.2 cm.  Mild fullness of the intrarenal collecting system noted. Echogenic structure identified in the interpolar region presumably related to internal ureteral stent.  Bladder:  Decompressed by Foley catheter.  Note:  Nodule seen adjacent to the upper pole the right kidney compatible with adrenal nodularity seen on previous CT scan. 15 mm gallstone noted.  IMPRESSION: Mild fullness of both intrarenal collecting systems. Urinary bladder is decompressed by Foley catheter.  Cholelithiasis.  Right adrenal nodularity.   Electronically Signed   By: Misty Stanley M.D.   On: 01/30/2015 10:33   Dg Chest Port 1 View  01/18/2015   CLINICAL DATA:  74 year old male status  post central line placement  EXAM: PORTABLE CHEST - 1 VIEW  COMPARISON:  CT scan chest/abdomen/ pelvis obtained earlier today ; most recent prior chest x-ray 07/08/2014  FINDINGS: External defibrillator pad projects over the chest. Interval placement of a right IJ approach central venous catheter. The catheter is somewhat lateral in position. The tip is in the region of the distal SVC. Increased enlargement of the cardiopericardial silhouette. And atherosclerotic thoracic aorta. Pulmonary vascular congestion. All  IMPRESSION: 1. The tip of the right IJ approach central venous catheter is in the region of the distal SVC although it is slightly more lateral than expected. Recommend clinical correlation to insure that the catheter is within the venous system. 2. No evidence of pneumothorax or new pleural effusion. 3. Cardiomegaly. 4. Pulmonary vascular congestion, low lung volumes and bibasilar atelectasis.   Electronically Signed   By: Jacqulynn Cadet M.D.   On: 01/18/2015 21:22   Dg Abd 2 Views  01/31/2015   CLINICAL DATA:  Mild small bowel ileus  EXAM: ABDOMEN - 2 VIEW  COMPARISON:  01/30/2015  FINDINGS: Nasogastric catheter remains in place. Bilateral ureteral stents as well as an aortic stent graft are noted. No free intraperitoneal air is seen. Scattered large and small bowel gas is noted. A few mildly dilated loops of small bowel are noted although the overall appearance has improved slightly in the interval from the prior exam.  IMPRESSION: Slight improvement in the degree of small bowel dilatation.   Electronically Signed   By: Inez Catalina M.D.   On: 01/31/2015 08:19   Dg Abd 2 Views  01/30/2015   CLINICAL DATA:  Adynamic ileus.  EXAM: ABDOMEN - 2 VIEW  COMPARISON:  Jan 27, 2015.  FINDINGS: Small bowel dilatation seen on prior exam is significantly improved, although residual dilatation remains in left lower quadrant of the abdomen. Distal tip of nasogastric tube is seen in proximal stomach. Bilateral  ureteral stents are again noted and unchanged. Status post stent graft repair of abdominal aortic aneurysm. Coil embolization of both internal iliac arteries is noted. Degenerative change of both hip joints is noted.  IMPRESSION: Mild small bowel dilatation is noted which is significantly improved compared to prior exam, most consistent with resolving ileus.   Electronically Signed   By: Marijo Conception, M.D.   On: 01/30/2015 08:42   Ct Angiography Chest/abd/pelvis ([facility] Hx)  01/18/2015   CLINICAL DATA:  Patient to ED via EMS from home with c.o respiratory distress, atrial fib, and groin pain.  EXAM: CT ANGIOGRAPHY CHEST, ABDOMEN AND PELVIS  TECHNIQUE: Multidetector CT imaging through the chest, abdomen and pelvis was performed using the standard protocol during bolus administration of intravenous contrast. Multiplanar reconstructed images and MIPs were obtained and reviewed to evaluate the vascular anatomy.  CONTRAST:  100 mL of Omnipaque 350 intravenous contrast.  COMPARISON:  None.  FINDINGS: CTA CHEST FINDINGS  Thoracic aorta shows atherosclerotic irregularity along the arch and descending aorta. There is mild dilation of the thoracic aorta most prominently of the descending aorta where it measures 4.8 cm. No aortic dissection. The aortic branch vessels are widely patent. Mild atherosclerotic calcifications are noted the base of the left subclavian artery and along the innominate artery.  The heart is moderately enlarged. There are moderate coronary artery calcifications. No neck base or axillary masses or adenopathy. No mediastinal or hilar masses or adenopathy. There is lower lobe opacity, left greater than right, as well as dependent left upper lobe opacity, most likely all atelectasis. Pneumonia is not excluded but felt less likely. There is no pulmonary edema. 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 Minimal left pleural effusion. No pneumothorax.  Review of the MIP images confirms the above  findings.  CTA ABDOMEN AND PELVIS FINDINGS  There is an excluded infrarenal abdominal aortic aneurysm. Native aneurysm measures 5.4 cm x 4.7 cm. The excluded lumen is widely patent. Stents extend into the external iliac arteries, also widely patent. There is dilation of the common femoral arteries. There is an aneurysm or pseudoaneurysm surrounding the right common femoral artery and proximal superficial femoral and deep femoral arteries, measuring 5.7 cm x 5.3 cm. There is no evidence of aortic rupture.  There is significant narrowing at the origin of the celiac axis, measuring 70%. The superior mesenteric artery is widely patent. Mild plaque is noted on the renal arteries without significant stenosis.  Liver, spleen, gallbladder, pancreas: Unremarkable. There is mild thickening of the adrenal glands suggesting nodular hyperplasia. This is stable. There is significant bilateral hydronephrosis. Bilateral ureteral stents extend from the renal pelves to the bladder. There is bilateral renal cortical thinning. No convincing renal mass. Bladder mildly thick-walled. Is distended.  No discrete enlarged lymph node.  No abnormal fluid collections.  There are scattered colonic diverticula. No diverticulitis. No bowel wall thickening or mesenteric inflammation. There is no bowel dilation to suggest obstruction.  MUSCULOSKELETAL  Bones are demineralized. There are degenerative changes throughout the visualized spine. There are areas of sclerosis in the posterior aspect of the T12 vertebrae with a small focus of sclerosis in the T7 vertebrae. T12 focus of sclerosis is chronic unchanged from the prior abdomen and pelvis CT. There are no fractures. No osteolytic lesions. There are arthropathic changes of both hip joints.  Review of the MIP images confirms the above findings.  IMPRESSION: 1. No evidence of aortic dissection or of a a rupture of the abdominal aortic aneurysm. 2. No acute vascular conclusion. 3. Dilation of the  descending thoracic aorta. 4. Excluded infrarenal abdominal aortic aneurysm. The excluded portion of the aneurysm is widely patent. 5. 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 6. Bilateral significant hydronephrosis with bilateral ureteral stents which appear well-positioned. The degree of hydronephrosis and the ureteral stents are stable from the prior CT. 7. Mild bladder wall thickening. Consider cystitis in the proper clinical setting. 8. In the chest, there is moderate cardiomegaly and dependent atelectasis, greater on the left. No convincing pneumonia and no pulmonary edema.   Electronically Signed   By: Lajean Manes M.D.   On: 01/18/2015 18:41   US Kidneys ([facility] H[facility])  01/19/2015   ADDENDUM REPORT: 01/19/2015 16:52  ADDENDUM: When directly comparing to recent CT, the hydronephrosis in the right kidney is moderately improved, particularly in the interpolar region and lower pole collecting system. The hydronephrosis  in the left kidney is mildly improved.  Notably, both kidneys are likely mildly improved when compared to CT dated 06/24/2014, indicating that the overall appearance is chronic.   Electronically Signed   By: Julian Hy M.D.   On: 01/19/2015 16:52   01/19/2015   CLINICAL DATA:  Bilateral hydronephrosis  EXAM: RENAL / URINARY TRACT ULTRASOUND COMPLETE  COMPARISON:  CT abdomen pelvis dated 01/18/2015  FINDINGS: Right Kidney:  Length: 11.7 cm. Moderate hydronephrosis. Indwelling ureteral stent.  Left Kidney:  Length: 11.9 cm. Moderate hydronephrosis. Indwelling ureteral stent.  Bladder:  Decompressed by indwelling Foley catheter.  IMPRESSION: Moderate hydronephrosis with indwelling ureteral stents bilaterally.  Bladder decompressed by indwelling Foley catheter.  Electronically Signed: By: Julian Hy M.D. On: 01/19/2015 16:23     CBC  Recent Labs Lab 02/13/15 1315 02/13/15 1632 02/14/15 0921 02/15/15 0535  02/16/15 0610  WBC 8.2 7.8 8.3 7.0 7.7  HGB 5.8* 5.9* 8.4* 8.0* 8.1*  HCT 17.9* 18.1* 24.9* 24.0* 24.2*  PLT 277 277 285 254 251  MCV 87.2 87.5 87.4 86.4 87.4  MCH 28.4 28.3 29.5 28.8 29.3  MCHC 32.6 32.3 33.8 33.4 33.5  RDW 19.1* 18.7* 17.4* 17.9* 17.7*  LYMPHSABS 1.2  --  1.2  --   --   MONOABS 0.8  --  0.7  --   --   EOSABS 0.6  --  0.6  --   --   BASOSABS 0.0  --  0.1  --   --     Chemistries   Recent Labs Lab 02/14/15 0921 02/15/15 0535  NA 141 140  K 4.0 3.8  CL 103 101  CO2 26 30  GLUCOSE 91 82  BUN 49* 23*  CREATININE 5.64* 3.40*  CALCIUM 8.3* 8.6*   ------------------------------------------------------------------------------------------------------------------ estimated creatinine clearance is 22.5 mL/min (by C-G formula based on Cr of 3.4). ------------------------------------------------------------------------------------------------------------------ No results for input(s): HGBA1C in the last 72 hours. ------------------------------------------------------------------------------------------------------------------ No results for input(s): CHOL, HDL, LDLCALC, TRIG, CHOLHDL, LDLDIRECT in the last 72 hours. ------------------------------------------------------------------------------------------------------------------ No results for input(s): TSH, T4TOTAL, T3FREE, THYROIDAB in the last 72 hours.  Invalid input(s): FREET3 ------------------------------------------------------------------------------------------------------------------  Recent Labs  02/15/15 0535  FERRITIN 581*  TIBC 244*  IRON 15*    Coagulation profile No results for input(s): INR, PROTIME in the last 168 hours.  No results for input(s): DDIMER in the last 72 hours.  Cardiac Enzymes No results for input(s): CKMB, TROPONINI, MYOGLOBIN in the last 168 hours.  Invalid input(s):  CK ------------------------------------------------------------------------------------------------------------------ Invalid input(s): Axis   1. Acute on chronic anemia due to GI blood loss. Patient likely has slow chronic blood loss. Status post transfusion of 2 units  of packed RBCs. Hemmoglobin  is stable, will monitor hemoglobin for now is stable likely discharge tomorrow morning  2. End-stage renal disease on hemodialysis with Tuesday, Thursday, Saturday schedule. Will receive hemodialysis today  3. Hypertension. Continue amlodipine  4. Paroxysmal atrial fibrillation. Continue amiodarone. No anticoagulation  5. Chronic Foley catheter with history of bladder and prostate cancer. Patient has appointment with urology on 02/15/2015.  6. Chronic respiratory failure/ chronic COPD without evidence of acute exacerbation T Inhalers     Code Status Orders        Start     Ordered   02/13/15 2205  Full code   Continuous     02/13/15 2204      Family Communication: patient   Procedures none   Consults  GI  DVT Prophylaxis   SCDs  Lab Results  Component Value Date   PLT 251 02/16/2015     Time Spent in minutes   93min   Dustin Flock M.D on 02/16/2015 at 12:44 PM  Between 7am to 6pm - Pager - [phone]  After 6pm go to [email or web address] - password [email or web address] [facility]   Office  [phone]

## 2015-02-16 NOTE — Progress Notes (Signed)
Subjective:   Patient seen during dialysis Tolerating well  Hgb stable    Objective:  Vital signs in last 24 hours:  Temp:  [97.8 F (36.6 C)-98.7 F (37.1 C)] 97.9 F (36.6 C) (05/26 0928) Pulse Rate:  [64-74] 68 (05/26 1200) Resp:  [18-20] 20 (05/26 0928) BP: (121-130)/(61-76) 130/76 mmHg (05/26 0938) SpO2:  [96 %-100 %] 99 % (05/26 0928) Weight:  [90.2 kg (198 lb 13.7 oz)-90.22 kg (198 lb 14.4 oz)] 90.2 kg (198 lb 13.7 oz) (05/26 0847)  Weight change: -2.779 kg (-6 lb 2 oz) Filed Weights   02/15/15 0500 02/16/15 0132 02/16/15 0847  Weight: 91.5 kg (201 lb 11.5 oz) 90.22 kg (198 lb 14.4 oz) 90.2 kg (198 lb 13.7 oz)    Intake/Output: I/O last 3 completed shifts: In: 240 [P.O.:237; I.V.:3] Out: 1200 [Urine:1200]   Intake/Output this shift:     General: not in acute distress Neck: supple CVS: irregular rate and rhythm Resp: clear  ABD: +soft,  EXT: no edema Neuro: alert and oriented Access: Left arm AVF Chronic foley   Basic Metabolic Panel:  Recent Labs Lab 02/14/15 0921 02/15/15 0535  NA 141 140  K 4.0 3.8  CL 103 101  CO2 26 30  GLUCOSE 91 82  BUN 49* 23*  CREATININE 5.64* 3.40*  CALCIUM 8.3* 8.6*    Liver Function Tests: No results for input(s): AST, ALT, ALKPHOS, BILITOT, PROT, ALBUMIN in the last 168 hours. No results for input(s): LIPASE, AMYLASE in the last 168 hours. No results for input(s): AMMONIA in the last 168 hours.  CBC:  Recent Labs Lab 02/13/15 1315 02/13/15 1632 02/14/15 0921 02/15/15 0535 02/16/15 0610  WBC 8.2 7.8 8.3 7.0 7.7  NEUTROABS 5.6  --  5.8  --   --   HGB 5.8* 5.9* 8.4* 8.0* 8.1*  HCT 17.9* 18.1* 24.9* 24.0* 24.2*  MCV 87.2 87.5 87.4 86.4 87.4  PLT 277 277 285 254 251    Cardiac Enzymes: No results for input(s): CKTOTAL, CKMB, CKMBINDEX, TROPONINI in the last 168 hours.  BNP: Invalid input(s): POCBNP  CBG: No results for input(s): GLUCAP in the last 168 hours.  Microbiology: Results for orders  placed or performed during the hospital encounter of 01/18/15  Culture, blood (single)     Status: None (Preliminary result)   Collection Time: 01/18/15  7:11 PM  Result Value Ref Range Status   Micro Text Report   Preliminary       COMMENT                   NO GROWTH IN 88 HOURS   ANTIBIOTIC                                                      Culture, blood (single)     Status: None (Preliminary result)   Collection Time: 01/18/15  7:11 PM  Result Value Ref Range Status   Micro Text Report   Preliminary       COMMENT                   NO GROWTH IN 48 HOURS   ANTIBIOTIC  Urine culture     Status: None   Collection Time: 01/18/15  7:36 PM  Result Value Ref Range Status   Micro Text Report   Final       SOURCE: INDWELLING CATHETER    COMMENT                   NO GROWTH IN 36 HOURS   ANTIBIOTIC                                                      Urine culture     Status: None   Collection Time: 01/20/15  4:09 PM  Result Value Ref Range Status   Micro Text Report   Final       SOURCE: INDWELLING CATH    ORGANISM 1                2000 CFU/ML ENTEROBACTER SPECIES   COMMENT                   UNABLE TO FURTHER SPECIATE   COMMENT                   -   ANTIBIOTIC                    ORG#1     CEFAZOLIN                     R         CEFOXITIN                     R         CEFTRIAXONE                   R         CIPROFLOXACIN                 I         ERTAPENEM                     S         GENTAMICIN                    S         IMIPENEM                      S         LEVOFLOXACIN                  I         NITROFURANTOIN                I         TRIMETH/SULFA                 S             Coagulation Studies: No results for input(s): LABPROT, INR in the last 72 hours.  Urinalysis: No results for input(s): COLORURINE, LABSPEC, PHURINE, GLUCOSEU, HGBUR, BILIRUBINUR, KETONESUR, PROTEINUR, UROBILINOGEN, NITRITE,  LEUKOCYTESUR in the last 72 hours.  Invalid input(s): APPERANCEUR    Imaging: No results found.   Medications:     .  amiodarone  200 mg Oral Daily  . amLODipine  5 mg Oral Daily  . atorvastatin  40 mg Oral Daily  . budesonide-formoterol  2 puff Inhalation BID  . magnesium oxide  400 mg Oral QHS  . multivitamin  1 tablet Oral Daily  . pantoprazole  40 mg Oral BID  . sevelamer carbonate  800 mg Oral TID WC  . sodium chloride  3 mL Intravenous Q12H  . tiotropium  18 mcg Inhalation Daily   acetaminophen **OR** acetaminophen, albuterol, HYDROcodone-acetaminophen, ondansetron **OR** ondansetron (ZOFRAN) IV  Assessment/ Plan:  74 y.o. 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 Hemodialysis  - Continue TTS schedule.   - Patient seen during dialysis Tolerating well   2. Anemia of chronic kidney disease: D63.1  - holding epo due to prostate cancer.  - transfusion on 5/5 and 5/6  - 2 units given this admission - GI evaluation ongoing  - heme positive stools  3. 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

## 2015-02-17 LAB — CBC
HCT: 24.5 % — ABNORMAL LOW (ref 40.0–52.0)
Hemoglobin: 8 g/dL — ABNORMAL LOW (ref 13.0–18.0)
MCH: 28.6 pg (ref 26.0–34.0)
MCHC: 32.6 g/dL (ref 32.0–36.0)
MCV: 87.6 fL (ref 80.0–100.0)
Platelets: 224 10*3/uL (ref 150–440)
RBC: 2.8 MIL/uL — ABNORMAL LOW (ref 4.40–5.90)
RDW: 18.2 % — AB (ref 11.5–14.5)
WBC: 7.6 10*3/uL (ref 3.8–10.6)

## 2015-02-17 LAB — MISC LABCORP TEST (SEND OUT)
Labcorp test code: 180764
Labcorp test code: 6510

## 2015-02-17 MED ORDER — TIOTROPIUM BROMIDE MONOHYDRATE 18 MCG IN CAPS: 18.0000 ug | ORAL_CAPSULE | Freq: Every day | RESPIRATORY_TRACT | Status: DC

## 2015-02-17 NOTE — Discharge Summary (Signed)
[name], 74 y.o., DOB August 16, 1941, MRN [id number]. Admission date: 02/13/2015 Discharge Date 02/17/2015 Primary MD Alvester Morin, MD Admitting Physician Hillary Bow, MD  Admission Diagnosis  Blood Transfusion  Discharge Diagnosis   Active Problems:   GI bleed    Past Medical History  Diagnosis Date  . PAF (paroxysmal atrial fibrillation)     a. new onset 12/2014 in the setting of UTI, sepsis, hypotension, and anemia; b. not on long term anticoagulation given anemia; c. family aware of stroke risk, they are ok with this; d. on amiodarone   . Chronic combined systolic and diastolic CHF (congestive heart failure)     a. echo 12/2014: EF 25-30%, anterior wall wall motion abnormalities; b. planned ischemic evaluation once patient is stable medically; c. echo 01/2015: EF 45-50%, no RWMA, mild AT, LA mildly dilated, mod pericardial effusion along LV free wall, no evidence of hemodynamic compromise  . ESRD on hemodialysis     a. Tuesday, Thursday, and Saturdays  . Anemia     a. baseline hgb ~ 8  . History of small bowel obstruction     a. 01/2015  . History of septic shock     a. 01/2015  . GERD (gastroesophageal reflux disease)   . Cancer     Prostate, Bladder  . Allergic rhinitis   . COPD (chronic obstructive pulmonary disease)   . Hypercholesteremia   . Cognitive communication deficit   . Magnesium deficiency   . Dementia   . Bladder cancer   . Prostate cancer   . Chronic indwelling Foley catheter     Past Surgical History  Procedure Laterality Date  . Cystoscopy w/ ureteral stent placement    . Transurethral resection of bladder tumor with gyrus (turbt-gyrus)    . Ureteral stent placement        Hospital Course  66 m with Bladder/prostate cancer, ESRD on HD, hypertension was hospitalized recently for   confusion, SOB, pt during that hospitalization was noted to have  new onset Afib with RVR: due to sepsis, ileus, septic shock, and gi bleed then d/c to rehab.  Patient had blood work check and showed to have drop in hemoglobin, was transferred to hospital. Pt was seen by gi they felt too high risk due to respiratory failure and multiple medical problems to due egd or colonoscopy. Pt was transfused, h. Pylori testing done results pending. Hemoglobin is stable.  Pt stable for d/c back to rehab facility.    Consults  GI  Significant Tests:  See full reports for all details    Dg Chest 2 View  02/13/2015   CLINICAL DATA:  Patient has had a dry cough for two days, he is hoarse when he speaks. Hx PAF, cancer, ESRD, GERD, COPD, CHF  EXAM: CHEST  2 VIEW  COMPARISON:  01/30/2015  FINDINGS: There has been improvement since the prior study. There are now only small pleural effusions there is mild dependent atelectasis. No convincing pulmonary edema. Lungs remain mildly hyperexpanded. There is mild enlargement of the cardiopericardial silhouette. Aorta is uncoiled. No mediastinal or hilar masses or evidence of adenopathy.  No pneumothorax.  IMPRESSION: 1. No acute cardiopulmonary disease. 2. Interval improvement from the prior study with decreased pleural fluid, most evident on the left, with only small residual pleural effusions with mild associated dependent atelectasis. No pulmonary edema.   Electronically Signed   By: Lajean Manes M.D.   On: 02/13/2015 19:18   Dg Chest 2 View  01/30/2015  CLINICAL DATA:  Shortness of breath  EXAM: CHEST  2 VIEW  COMPARISON:  CT chest dated 01/26/2015  FINDINGS: Patchy bilateral lower lobe opacities, likely atelectasis. Moderate left pleural effusion. No pneumothorax.  Right IJ venous catheter terminates in the mid SVC.  Cardiomegaly with pericardial effusion on prior CT.  Enteric tube courses below the diaphragm.  IMPRESSION: Moderate left pleural effusion with bilateral lower lobe atelectasis.  Cardiomegaly with pericardial effusion on prior CT.   Electronically Signed   By: Julian Hy M.D.   On: 01/30/2015 08:47   Dg Chest 2  View  01/26/2015   CLINICAL DATA:  CURRENT PNA, SBO, PT HAS NG TUBE, PT IS ON DIALYSIS AND HAS HX OF PAF AND CHF  EXAM: CHEST  2 VIEW  COMPARISON:  01/18/2015  FINDINGS: Moderate enlargement of the cardiopericardial silhouette. Small left and probable small right pleural effusions.  No mediastinal or hilar masses.  Mild lung base opacity, most likely atelectasis. On the left, pneumonia should be considered likely if there are consistent clinical symptoms.  No pulmonary edema.  No pneumothorax.  Nasogastric tube, new, passes below the diaphragm into the stomach.  Right internal jugular central venous line has its tip in the upper superior vena cava, which appears retracted superiorly from the previous exam.  IMPRESSION: 1. Stable cardiomegaly. 2. Lung base opacity, left greater than right. There is a small left pleural effusion and a probable small right pleural effusion. Additional lung base opacity is likely atelectasis. Pneumonia in the left is possible P the appearance is similar to the most recent prior exam. 3. No pulmonary edema.  No new lung abnormalities. 4. Right IJ central venous line appears retracted. Tip now projects in the upper superior vena cava. 5. Nasogastric tube passes below the diaphragm into the stomach, new from the prior study.   Electronically Signed   By: Lajean Manes M.D.   On: 01/26/2015 14:41   Dg Abd 1 View  02/02/2015   CLINICAL DATA:  Ileus, followup  EXAM: ABDOMEN - 1 VIEW  COMPARISON:  Abdomen films of 01/31/2015  FINDINGS: There is both large and small bowel gas present without significant distention noted. Bilateral double-J ureteral stents remain. Iliac artery stents also are noted as well as coil embolization of the internal iliac arteries bilaterally. There is some opacity at the left lung base not well evaluated on this abdomen film.  IMPRESSION: No bowel obstruction or significant ileus.   Electronically Signed   By: Ivar Drape M.D.   On: 02/02/2015 08:17   Dg Abd 1  View  01/27/2015   CLINICAL DATA:  Ileus, abdominal pain  EXAM: ABDOMEN - 1 VIEW  COMPARISON:  01/26/2015  FINDINGS: Tip of nasogastric tube projects over gastric antrum.  Prior endoluminal stenting of abdominal aorta and iliac arteries.  BILATERAL ureteral stents present.  Dilatation of small bowel loops without bowel wall thickening.  Scattered gas within nondistended colon and rectum.  Bones diffusely demineralized.  Atelectasis versus consolidation LEFT lower lobe.  IMPRESSION: Diffuse small bowel dilatation, question ileus though obstruction not excluded ; radiographic followup as clinically indicated.  Atelectasis versus consolidation LEFT lower lobe.   Electronically Signed   By: Lavonia Dana M.D.   On: 01/27/2015 11:10   Dg Abd 1 View  01/26/2015   CLINICAL DATA:  Increased abdominal distension  EXAM: ABDOMEN - 1 VIEW  COMPARISON:  None.  FINDINGS: There is gaseous distention of small bowel and colon. There is no evidence of pneumoperitoneum,  portal venous gas or pneumatosis. There are no pathologic calcifications along the expected course of the ureters. There are bilateral ureteral stents. There is an aorto bi-iliac stent graft present. There are embolization coils which are likely within the bilateral internal iliac arteries. 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  IMPRESSION: Gaseous distention of small bowel colon concerning for an ileus.   Electronically Signed   By: Kathreen Devoid   On: 01/26/2015 21:21   Dg Abd 1 View  01/26/2015   CLINICAL DATA:  Small-bowel obstruction.  EXAM: ABDOMEN - 1 VIEW  COMPARISON:  01/25/2015.  FINDINGS: NG tube tip projected over the upper stomach. Bilateral double-J ureteral stents. Aorto iliac stent graft. Hypogastric coils noted. Mild improvement of small bowel distention. Small bowel distention however remains. No free air. Persistent left lower lobe atelectasis and/or infiltrate with left pleural effusion.  IMPRESSION: 1. NG tube tip projected over the  upper stomach. Again advancement of approximately 10 cm is suggested to ensure that the proximal port lies below the GE junction. 2. Persistent but slightly improved small bowel distention. 3. Persistent left lower lobe atelectasis and/or infiltrate with left pleural effusion.  These results will be called to the ordering clinician or representative by the Radiologist Assistant, and communication documented in the PACS or zVision Dashboard.   Electronically Signed   By: Marcello Moores  Register   On: 01/26/2015 07:29   Dg Abd 1 View  01/25/2015   CLINICAL DATA:  Abdominal pain, improving symptoms  EXAM: ABDOMEN - 1 VIEW  COMPARISON:  Supine abdominal film dated Jan 24, 2015  FINDINGS: There has been interval placement of a nasogastric tube. The proximal port lies at or just below the level of the GE junction. There remain loops of mildly distended gas-filled small bowel. The caliber of the colon is normal. There small amount of rectal gas. Bilateral double-J ureteral stents are unchanged in position. There is an aorto bi-iliac stent graft in place. There are metallic coils in the pelvis likely from previous embolization procedures.  IMPRESSION: There has been slight interval increase in the volume of small bowel gas consistent with a mid to distal small bowel obstruction. Advancement of the nasogastric tube by 10 cm is recommended to assure that the proximal port lies below the GE junction.   Electronically Signed   By: David  Martinique M.D.   On: 01/25/2015 07:53   Dg Abd 1 View  01/24/2015   CLINICAL DATA:  Ileus.  EXAM: ABDOMEN - 1 VIEW  COMPARISON:  01/23/2015.  01/22/2015.  FINDINGS: Bilateral double-J ureteral stents noted. Prior aortoiliac stent graft placement with bilateral hypogastric coils. Surgical clips in the abdomen and pelvis. Dilated loops of small bowel noted. These have slightly improved. Colon is nondistended. Oral contrast is present in the colon. No free air. Vascular calcification. Left lower lobe  atelectasis and/or infiltrate with small left pleural effusion. Cardiomegaly. Diffuse osteopenia. Degenerative changes lumbar spine and both hips.  IMPRESSION: 1. Dilated loops of small bowel noted. Small bowel obstruction cannot be excluded . Slight interim improvement. 2. Bilateral double-J ureteral stents in good anatomic position. 3. Aortoiliac stent graft noted. 4. Left lower lobe atelectasis and/or infiltrate with small left pleural effusion. 5. Cardiomegaly.   Electronically Signed   By: Marcello Moores  Register   On: 01/24/2015 08:27   Dg Abd 1 View  01/23/2015   CLINICAL DATA:  Abdominal pain, nausea, abdominal distension, ileus  EXAM: ABDOMEN - 1 VIEW  COMPARISON:  01/22/2015  FINDINGS: Pelvic embolization  coils.  Aortoiliac stenting.  BILATERAL ureteral stents.  Persistent dilatation of small bowel loops question ileus versus small bowel obstruction, with slightly less small bowel gas and on the previous study.  No definite bowel wall thickening.  Bones diffusely demineralized.  IMPRESSION: Slightly decreased small bowel gaseous distention since previous exam, question ileus versus small bowel obstruction.   Electronically Signed   By: Lavonia Dana M.D.   On: 01/23/2015 14:11   Dg Abd 1 View  01/22/2015   CLINICAL DATA:  74 year old male with a history of abdominal pain. Nausea  EXAM: ABDOMEN - 1 VIEW  COMPARISON:  01/21/2015, 01/18/2015  FINDINGS: Two frontal supine images.  Multiple distended small bowel loops, as was seen on the comparison study. Paucity of colonic gas. Small amount of retained enteric contrast within the left colon. The enteric contrast was not present on the comparison plain film study, and has progressed through the colon in the last 24 hours.  No definite evidence of free air.  Diffuse osteopenia.  No acute fracture.  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 Changes of bilateral hypogastric artery coiling.   Wire-less pressure monitor to the left lateral aspect of the endograft.  Bilateral double-J ureteral stents in place.  IMPRESSION: Persisting dilated small bowel loops, either an ileus or small-bowel obstruction. If there is concern for acute intra abdominal process, CT would be indicated.  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 A wireless intrasac pressure monitor (CardioMEMES device) projects to the left aspect of the graft.  Unchanged position of bilateral double-J ureteral stents.  Signed,  Dulcy Fanny. Earleen Newport, DO  [facility]  [facility]   Electronically Signed   By: Corrie Mckusick D.O.   On: 01/22/2015 09:40   Dg Abd 1 View  01/21/2015   CLINICAL DATA:  Abdominal pain  EXAM: ABDOMEN - 1 VIEW  COMPARISON:  CT abdomen and pelvis January 18, 2015  FINDINGS: There are double-J stents bilaterally. There is also an aorto bi-iliac stent for aneurysm. There is generalized bowel dilatation in a pattern suggesting ileus. No free air is seen on this supine examination. There are no air-fluid levels. There is atelectatic change in the lung bases. There are coils in the pelvis, presumably due to internal iliac artery embolization.  IMPRESSION: Bowel gas pattern most consistent with ileus. No free air seen. Double-J stents present as well as aortoiliac stent. Coils are present in the pelvis as well.   Electronically Signed   By: Lowella Grip III M.D.   On: 01/21/2015 10:12   Ct Chest Wo Contrast  01/26/2015   CLINICAL DATA:  Encephalopathy, shortness of breath  EXAM: CT CHEST WITHOUT CONTRAST  TECHNIQUE: Multidetector CT imaging of the chest was performed following the standard protocol without IV contrast.  COMPARISON:  None.  FINDINGS: The central airways are patent. There are small bilateral pleural effusions, left greater than right. There is no focal consolidation. There is mild bibasilar atelectasis.  There is no pneumothorax.  There are no pathologically enlarged axillary, hilar or mediastinal lymph nodes.  The heart size is normal. There is a moderate pericardial effusion measuring 3.2 cm along the left ventricle. 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 There is multi vessel coronary artery atherosclerosis involving the left main, lad, circumflex and RCA.  Review of bone windows demonstrates no focal lytic  or sclerotic lesions.  Limited non-contrast images of the upper abdomen were obtained. There are 2 hypodense nodules arising from the right adrenal gland measuring 14 mm and 18 mm respectively most consistent with adrenal adenomas. There is nodularity of the left adrenal gland. There is a nasogastric tube with the tip in the stomach. Partially visualized is left hydronephrosis.  IMPRESSION: 1. Bilateral small pleural effusions, left greater than right with bibasilar atelectasis. 2. Interval development of a moderate pericardial effusion measuring 3.2 cm along the left lateral ventricle. 3. Multi vessel coronary artery atherosclerosis. 4. Distal aortic arch measures 4.7 cm in diameter. Recommend semi-annual imaging followup by CTA or MRA and referral to cardiothoracic surgery if not already obtained. This recommendation follows 2010 ACCF/AHA/AATS/ACR/ASA/SCA/SCAI/SIR/STS/SVM Guidelines for the Diagnosis and Management of Patients With Thoracic Aortic Disease. Circulation. 2010; 121: [id number]   Electronically Signed   By: Kathreen Devoid   On: 01/26/2015 21:20   US Renal  01/30/2015   CLINICAL DATA:  Subsequent encounter for hydronephrosis  EXAM: RENAL / URINARY TRACT ULTRASOUND COMPLETE  COMPARISON:  CT scan from 01/18/2015.  FINDINGS: Right Kidney:  Length: 11.5 cm. Collecting system fullness noted without overt hydronephrosis.  Left Kidney:  Length: 11.2 cm. Mild fullness of the intrarenal collecting system noted. Echogenic structure identified in the interpolar  region presumably related to internal ureteral stent.  Bladder:  Decompressed by Foley catheter.  Note:  Nodule seen adjacent to the upper pole the right kidney compatible with adrenal nodularity seen on previous CT scan. 15 mm gallstone noted.  IMPRESSION: Mild fullness of both intrarenal collecting systems. Urinary bladder is decompressed by Foley catheter.  Cholelithiasis.  Right adrenal nodularity.   Electronically Signed   By: Misty Stanley M.D.   On: 01/30/2015 10:33   Dg Chest Port 1 View  01/18/2015   CLINICAL DATA:  74 year old male status post central line placement  EXAM: PORTABLE CHEST - 1 VIEW  COMPARISON:  CT scan chest/abdomen/ pelvis obtained earlier today ; most recent prior chest x-ray 07/08/2014  FINDINGS: External defibrillator pad projects over the chest. Interval placement of a right IJ approach central venous catheter. The catheter is somewhat lateral in position. The tip is in the region of the distal SVC. Increased enlargement of the cardiopericardial silhouette. And atherosclerotic thoracic aorta. Pulmonary vascular congestion. All  IMPRESSION: 1. The tip of the right IJ approach central venous catheter is in the region of the distal SVC although it is slightly more lateral than expected. Recommend clinical correlation to insure that the catheter is within the venous system. 2. No evidence of pneumothorax or new pleural effusion. 3. Cardiomegaly. 4. Pulmonary vascular congestion, low lung volumes and bibasilar atelectasis.   Electronically Signed   By: Jacqulynn Cadet M.D.   On: 01/18/2015 21:22   Dg Abd 2 Views  01/31/2015   CLINICAL DATA:  Mild small bowel ileus  EXAM: ABDOMEN - 2 VIEW  COMPARISON:  01/30/2015  FINDINGS: Nasogastric catheter remains in place. Bilateral ureteral stents as well as an aortic stent graft are noted. No free intraperitoneal air is seen. Scattered large and small bowel gas is noted. A few mildly dilated loops of small bowel are noted although the overall  appearance has improved slightly in the interval from the prior exam.  IMPRESSION: Slight improvement in the degree of small bowel dilatation.   Electronically Signed   By: Inez Catalina M.D.   On: 01/31/2015 08:19   Dg Abd 2 Views  01/30/2015   CLINICAL  DATA:  Adynamic ileus.  EXAM: ABDOMEN - 2 VIEW  COMPARISON:  Jan 27, 2015.  FINDINGS: Small bowel dilatation seen on prior exam is significantly improved, although residual dilatation remains in left lower quadrant of the abdomen. Distal tip of nasogastric tube is seen in proximal stomach. Bilateral ureteral stents are again noted and unchanged. Status post stent graft repair of abdominal aortic aneurysm. Coil embolization of both internal iliac arteries is noted. Degenerative change of both hip joints is noted.  IMPRESSION: Mild small bowel dilatation is noted which is significantly improved compared to prior exam, most consistent with resolving ileus.   Electronically Signed   By: Marijo Conception, M.D.   On: 01/30/2015 08:42   Ct Angiography Chest/abd/pelvis ([facility] Hx)  01/18/2015   CLINICAL DATA:  Patient to ED via EMS from home with c.o respiratory distress, atrial fib, and groin pain.  EXAM: CT ANGIOGRAPHY CHEST, ABDOMEN AND PELVIS  TECHNIQUE: Multidetector CT imaging through the chest, abdomen and pelvis was performed using the standard protocol during bolus administration of intravenous contrast. Multiplanar reconstructed images and MIPs were obtained and reviewed to evaluate the vascular anatomy.  CONTRAST:  100 mL of Omnipaque 350 intravenous contrast.  COMPARISON:  None.  FINDINGS: CTA CHEST FINDINGS  Thoracic aorta shows atherosclerotic irregularity along the arch and descending aorta. There is mild dilation of the thoracic aorta most prominently of the descending aorta where it measures 4.8 cm. No aortic dissection. The aortic branch vessels are widely patent. Mild atherosclerotic calcifications are noted the base of the left subclavian artery and  along the innominate artery.  The heart is moderately enlarged. There are moderate coronary artery calcifications. No neck base or axillary masses or adenopathy. No mediastinal or hilar masses or adenopathy. There is lower lobe opacity, left greater than right, as well as dependent left upper lobe opacity, most likely all atelectasis. Pneumonia is not excluded but felt less likely. There is no pulmonary edema. 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 Minimal left pleural effusion. No pneumothorax.  Review of the MIP images confirms the above findings.  CTA ABDOMEN AND PELVIS FINDINGS  There is an excluded infrarenal abdominal aortic aneurysm. Native aneurysm measures 5.4 cm x 4.7 cm. The excluded lumen is widely patent. Stents extend into the external iliac arteries, also widely patent. There is dilation of the common femoral arteries. There is an aneurysm or pseudoaneurysm surrounding the right common femoral artery and proximal superficial femoral and deep femoral arteries, measuring 5.7 cm x 5.3 cm. There is no evidence of aortic rupture.  There is significant narrowing at the origin of the celiac axis, measuring 70%. The superior mesenteric artery is widely patent. Mild plaque is noted on the renal arteries without significant stenosis.  Liver, spleen, gallbladder, pancreas: Unremarkable. There is mild thickening of the adrenal glands suggesting nodular hyperplasia. This is stable. There is significant bilateral hydronephrosis. Bilateral ureteral stents extend from the renal pelves to the bladder. There is bilateral renal cortical thinning. No convincing renal mass. Bladder mildly thick-walled. Is distended.  No discrete enlarged lymph node.  No abnormal fluid collections.  There are scattered colonic diverticula. No diverticulitis. No bowel wall thickening or mesenteric inflammation. There is no bowel dilation to suggest obstruction.  MUSCULOSKELETAL  Bones are demineralized. There are degenerative  changes throughout the visualized spine. There are areas of sclerosis in the posterior aspect of the T12 vertebrae with a small focus of sclerosis in the T7 vertebrae. T12 focus of sclerosis is chronic  unchanged from the prior abdomen and pelvis CT. There are no fractures. No osteolytic lesions. There are arthropathic changes of both hip joints.  Review of the MIP images confirms the above findings.  IMPRESSION: 1. No evidence of aortic dissection or of a a rupture of the abdominal aortic aneurysm. 2. No acute vascular conclusion. 3. Dilation of the descending thoracic aorta. 4. Excluded infrarenal abdominal aortic aneurysm. The excluded portion of the aneurysm is widely patent. 5. 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 6. Bilateral significant hydronephrosis with bilateral ureteral stents which appear well-positioned. The degree of hydronephrosis and the ureteral stents are stable from the prior CT. 7. Mild bladder wall thickening. Consider cystitis in the proper clinical setting. 8. In the chest, there is moderate cardiomegaly and dependent atelectasis, greater on the left. No convincing pneumonia and no pulmonary edema.   Electronically Signed   By: Lajean Manes M.D.   On: 01/18/2015 18:41   US Kidneys ([facility] Hx)  01/19/2015   ADDENDUM REPORT: 01/19/2015 16:52  ADDENDUM: When directly comparing to recent CT, the hydronephrosis in the right kidney is moderately improved, particularly in the interpolar region and lower pole collecting system. The hydronephrosis in the left kidney is mildly improved.  Notably, both kidneys are likely mildly improved when compared to CT dated 06/24/2014, indicating that the overall appearance is chronic.   Electronically Signed   By: Julian Hy M.D.   On: 01/19/2015 16:52   01/19/2015   CLINICAL DATA:  Bilateral hydronephrosis  EXAM: RENAL / URINARY TRACT ULTRASOUND COMPLETE  COMPARISON:  CT abdomen pelvis  dated 01/18/2015  FINDINGS: Right Kidney:  Length: 11.7 cm. Moderate hydronephrosis. Indwelling ureteral stent.  Left Kidney:  Length: 11.9 cm. Moderate hydronephrosis. Indwelling ureteral stent.  Bladder:  Decompressed by indwelling Foley catheter.  IMPRESSION: Moderate hydronephrosis with indwelling ureteral stents bilaterally.  Bladder decompressed by indwelling Foley catheter.  Electronically Signed: By: Julian Hy M.D. On: 01/19/2015 16:23       Today   Subjective:   Harrold Donath   Doing better this am no new complatins.   Objective:   Blood pressure 136/81, pulse 69, temperature 97.5 F (36.4 C), temperature source Oral, resp. rate 17, height 6\' 2"  (1.88 m), weight 90.7 kg (199 lb 15.3 oz), SpO2 100 %.  .  Intake/Output Summary (Last 24 hours) at 02/17/15 0947 Last data filed at 02/17/15 0619  Gross per 24 hour  Intake    220 ml  Output    875 ml  Net   -655 ml    Exam VITAL SIGNS: Blood pressure 136/81, pulse 69, temperature 97.5 F (36.4 C), temperature source Oral, resp. rate 17, height 6\' 2"  (1.88 m), weight 90.7 kg (199 lb 15.3 oz), SpO2 100 %.  GENERAL:  74 y.o.-year-old patient lying in the bed with no acute distress.  EYES: Pupils equal, round, reactive to light and accommodation. No scleral icterus. Extraocular muscles intact.  HEENT: Head atraumatic, normocephalic. Oropharynx and nasopharynx clear.  NECK:  Supple, no jugular venous distention. No thyroid enlargement, no tenderness.  LUNGS: Normal breath sounds bilaterally, no wheezing, rales,rhonchi or crepitation. No use of accessory muscles of respiration.  CARDIOVASCULAR: S1, S2 normal. No murmurs, rubs, or gallops.  ABDOMEN: Soft, nontender, nondistended. Bowel sounds present. No organomegaly or mass.  EXTREMITIES: No pedal edema, cyanosis, or clubbing.  NEUROLOGIC: Cranial nerves II through XII are intact. Muscle strength 5/5 in all extremities. Sensation intact. Gait not checked.  PSYCHIATRIC: The  patient is alert and oriented x 3.  SKIN: No obvious rash, lesion, or ulcer.   Data Review   Cultures -    CBC w Diff: Lab Results  Component Value Date   WBC 7.6 02/17/2015   WBC 11.7* 01/20/2015   HGB 8.0* 02/17/2015   HGB 6.9* 01/20/2015   HCT 24.5* 02/17/2015   HCT 21.5* 01/20/2015   PLT 224 02/17/2015   PLT 176 01/20/2015   LYMPHOPCT 14% 02/14/2015   LYMPHOPCT 9.0 01/20/2015   MONOPCT 8% 02/14/2015   MONOPCT 7.8 01/20/2015   EOSPCT 7% 02/14/2015   EOSPCT 1.7 01/20/2015   BASOPCT 1% 02/14/2015   BASOPCT 0.2 01/20/2015   CMP: Lab Results  Component Value Date   NA 140 02/15/2015   NA 141 01/20/2015   K 3.8 02/15/2015   K 3.6 01/20/2015   CL 101 02/15/2015   CL 99* 01/20/2015   CO2 30 02/15/2015   CO2 30 01/20/2015   BUN 23* 02/15/2015   BUN 32* 01/20/2015   CREATININE 3.40* 02/15/2015   CREATININE 6.73* 01/20/2015   PROT 7.2 02/08/2015   PROT 6.5 07/04/2014   ALBUMIN 2.5* 02/08/2015   ALBUMIN 2.4* 07/06/2014   BILITOT 0.5 02/08/2015   ALKPHOS 88 02/08/2015   ALKPHOS 139* 07/04/2014   AST 20 02/08/2015   AST 19 07/04/2014   ALT 13* 02/08/2015   ALT 10* 07/04/2014  .  Micro Results Recent Results (from the past 240 hour(s))  MRSA PCR Screening     Status: Abnormal   Collection Time: 02/16/15  9:45 AM  Result Value Ref Range Status   MRSA by PCR POSITIVE (A) NEGATIVE Final    Comment: CRITICAL RESULT CALLED TO, READ BACK BY AND VERIFIED WITH: ALISHA BABB 02/16/15 AT 1247        The GeneXpert MRSA Assay (FDA approved for NASAL specimens only), is one component of a comprehensive MRSA colonization surveillance program. It is not intended to diagnose MRSA infection nor to guide or monitor treatment for MRSA infections.         Code Status Orders        Start     Ordered   02/13/15 2205  Full code   Continuous     02/13/15 2204         Follow-up Information    Follow up with Hollice Espy, MD In 7 days.   Specialty:  Urology    Why:  urinary retention   Contact information:   [place] [address] [phone]       Discharge Medications     Medication List    TAKE these medications        albuterol 108 (90 BASE) MCG/ACT inhaler  Commonly known as:  PROVENTIL HFA;VENTOLIN HFA  Inhale 2 puffs into the lungs every 4 (four) hours as needed for wheezing or shortness of breath (Every 4 to 6 hours).     albuterol (2.5 MG/3ML) 0.083% nebulizer solution  Commonly known as:  PROVENTIL  Take 2.5 mg by nebulization every 6 (six) hours as needed for wheezing or shortness of breath.     amiodarone 400 MG tablet  Commonly known as:  PACERONE  Take 1 tablet (400 mg total) by mouth 2 (two) times daily.     amiodarone 200 MG tablet  Commonly known as:  PACERONE  Take 1 tablet (200 mg total) by mouth daily.     amLODipine 5 MG tablet  Commonly known  as:  NORVASC  Take 5 mg by mouth daily.     atorvastatin 40 MG tablet  Commonly known as:  LIPITOR  Take 1 tablet (40 mg total) by mouth daily.     budesonide-formoterol 160-4.5 MCG/ACT inhaler  Commonly known as:  SYMBICORT  Inhale 2 puffs into the lungs 2 (two) times daily.     chlorpheniramine 4 MG tablet  Commonly known as:  CHLOR-TRIMETON  Take 4 mg by mouth every 4 (four) hours as needed for allergies.     feeding supplement (NEPRO CARB STEADY) Liqd  Take 237 mLs by mouth 2 (two) times daily between meals.     guaifenesin 100 MG/5ML syrup  Commonly known as:  ROBITUSSIN  Take 200 mg by mouth 3 (three) times daily as needed for cough.     loperamide 2 MG tablet  Commonly known as:  IMODIUM A-D  Take 2 mg by mouth every 4 (four) hours as needed for diarrhea or loose stools.     magnesium oxide 400 MG tablet  Commonly known as:  MAG-OX  Take 400 mg by mouth at bedtime.     multivitamin Tabs tablet  Take 1 tablet by mouth daily.     pantoprazole 40 MG tablet  Commonly known as:  PROTONIX  Take 1 tablet (40 mg  total) by mouth daily.     sevelamer carbonate 800 MG tablet  Commonly known as:  RENVELA  Take 800 mg by mouth 3 (three) times daily with meals.     Tiotropium Bromide Monohydrate 2.5 MCG/ACT Aers  Inhale 1 puff into the lungs daily.         Total Time in preparing paper work, data evaluation and todays exam - 35 minutes  Dustin Flock M.D on 02/17/2015 at 9:47 AM  [facility]   Office  [phone]

## 2015-02-17 NOTE — Clinical Social Work Note (Signed)
Physician to discharge patient to return to [facility]. Hilda Blades at [facility] is aware and so is patient and patient's cousin: [name]. Patient and Ms. Cousin request transport via EMS. EMS auth being obtained. Orders sent to [facility]. Patient and Ms. Cousin are in agreement with discharge. [place] , [place]

## 2015-02-17 NOTE — Progress Notes (Signed)
Spoke with Mel, [employer] rep at [phone], to notify of non-emergent EMS transport.  Auth notification reference given as [id number].   Service date range good from 02/17/15 - 05/18/15.   Gap exception requested to determine if services can be considered at an in-network level.

## 2015-02-17 NOTE — Discharge Instructions (Signed)
°  DIET:  Renal diet  DISCHARGE CONDITION:  Good  ACTIVITY:  Activity as tolerated, pt eval and treat  OXYGEN:  Home Oxygen: Yes.     Oxygen Delivery: 2 liters/min via Patient connected to nasal cannula oxygen  DISCHARGE LOCATION:  nursing home    Keep foley in place until seen by urology  If you experience worsening of your admission symptoms, develop shortness of breath, life threatening emergency, suicidal or homicidal thoughts you must seek medical attention immediately by calling 911 or calling your MD immediately  if symptoms less severe.  You Must read complete instructions/literature along with all the possible adverse reactions/side effects for all the Medicines you take and that have been prescribed to you. Take any new Medicines after you have completely understood and accpet all the possible adverse reactions/side effects.   Please note  You were cared for by a hospitalist during your hospital stay. If you have any questions about your discharge medications or the care you received while you were in the hospital after you are discharged, you can call the unit and asked to speak with the hospitalist on call if the hospitalist that took care of you is not available. Once you are discharged, your primary care physician will handle any further medical issues. Please note that NO REFILLS for any discharge medications will be authorized once you are discharged, as it is imperative that you return to your primary care physician (or establish a relationship with a primary care physician if you do not have one) for your aftercare needs so that they can reassess your need for medications and monitor your lab values.

## 2015-02-17 NOTE — Progress Notes (Signed)
Subjective:  Doing well.  No acute complaints No nausea or vomiting    Objective:  Vital signs in last 24 hours:  Temp:  [97.5 F (36.4 C)-98.2 F (36.8 C)] 97.5 F (36.4 C) (05/27 0752) Pulse Rate:  [61-71] 69 (05/27 0752) Resp:  [17-20] 17 (05/27 0752) BP: (112-136)/(61-81) 136/81 mmHg (05/27 0752) SpO2:  [97 %-100 %] 100 % (05/27 0752) Weight:  [90.7 kg (199 lb 15.3 oz)] 90.7 kg (199 lb 15.3 oz) (05/26 1321)  Weight change: -0.02 kg (-0.7 oz) Filed Weights   02/16/15 0132 02/16/15 0847 02/16/15 1321  Weight: 90.22 kg (198 lb 14.4 oz) 90.2 kg (198 lb 13.7 oz) 90.7 kg (199 lb 15.3 oz)    Intake/Output: I/O last 3 completed shifts: In: 32 [P.O.:457] Out: 1275 [Urine:1275]   Intake/Output this shift:     General: not in acute distress Neck: supple CVS: irregular rate and rhythm Resp: clear  ABD: +soft,  EXT: no edema Neuro: alert and oriented Access: Left arm AVF Chronic foley   Basic Metabolic Panel:  Recent Labs Lab 02/14/15 0921 02/15/15 0535  NA 141 140  K 4.0 3.8  CL 103 101  CO2 26 30  GLUCOSE 91 82  BUN 49* 23*  CREATININE 5.64* 3.40*  CALCIUM 8.3* 8.6*    Liver Function Tests: No results for input(s): AST, ALT, ALKPHOS, BILITOT, PROT, ALBUMIN in the last 168 hours. No results for input(s): LIPASE, AMYLASE in the last 168 hours. No results for input(s): AMMONIA in the last 168 hours.  CBC:  Recent Labs Lab 02/13/15 1315 02/13/15 1632 02/14/15 0921 02/15/15 0535 02/16/15 0610 02/17/15 0712  WBC 8.2 7.8 8.3 7.0 7.7 7.6  NEUTROABS 5.6  --  5.8  --   --   --   HGB 5.8* 5.9* 8.4* 8.0* 8.1* 8.0*  HCT 17.9* 18.1* 24.9* 24.0* 24.2* 24.5*  MCV 87.2 87.5 87.4 86.4 87.4 87.6  PLT 277 277 285 254 251 224    Cardiac Enzymes: No results for input(s): CKTOTAL, CKMB, CKMBINDEX, TROPONINI in the last 168 hours.  BNP: Invalid input(s): POCBNP  CBG: No results for input(s): GLUCAP in the last 168 hours.  Microbiology: Results for orders  placed or performed during the hospital encounter of 02/13/15  MRSA PCR Screening     Status: Abnormal   Collection Time: 02/16/15  9:45 AM  Result Value Ref Range Status   MRSA by PCR POSITIVE (A) NEGATIVE Final    Comment: CRITICAL RESULT CALLED TO, READ BACK BY AND VERIFIED WITH: ALISHA BABB 02/16/15 AT 1247        The GeneXpert MRSA Assay (FDA approved for NASAL specimens only), is one component of a comprehensive MRSA colonization surveillance program. It is not intended to diagnose MRSA infection nor to guide or monitor treatment for MRSA infections.     Coagulation Studies: No results for input(s): LABPROT, INR in the last 72 hours.  Urinalysis: No results for input(s): COLORURINE, LABSPEC, PHURINE, GLUCOSEU, HGBUR, BILIRUBINUR, KETONESUR, PROTEINUR, UROBILINOGEN, NITRITE, LEUKOCYTESUR in the last 72 hours.  Invalid input(s): APPERANCEUR    Imaging: No results found.   Medications:     . amiodarone  200 mg Oral Daily  . amLODipine  5 mg Oral Daily  . atorvastatin  40 mg Oral Daily  . budesonide-formoterol  2 puff Inhalation BID  . magnesium oxide  400 mg Oral QHS  . multivitamin  1 tablet Oral Daily  . pantoprazole  40 mg Oral BID  . sevelamer carbonate  800 mg Oral TID WC  . sodium chloride  3 mL Intravenous Q12H  . tiotropium  18 mcg Inhalation Daily   acetaminophen **OR** acetaminophen, albuterol, HYDROcodone-acetaminophen, ondansetron **OR** ondansetron (ZOFRAN) IV  Assessment/ Plan:  74 y.o. 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 Hemodialysis  - Continue TTS schedule.   - patient expected to be discharged today.  If still in the hospital, will dialyze on Saturday  2. Anemia of chronic kidney disease: D63.1  - holding epo due to prostate cancer.  - transfusion on 5/5 and 5/6  - 2 units given this admission - GI evaluation ongoing  - heme positive stools  3. Secondary Hyperparathyroidism: N25.81  - continue low dose renvela  4. Prostate cancer - Followed at [facility]rology      LOS: 4 [name] 5/27/201612:59 PM

## 2015-02-23 ENCOUNTER — Encounter: Payer: Self-pay | Admitting: *Deleted

## 2015-02-24 ENCOUNTER — Ambulatory Visit: Payer: Self-pay | Admitting: Urology

## 2015-02-27 ENCOUNTER — Encounter: Payer: Self-pay | Admitting: Urology

## 2015-02-27 ENCOUNTER — Encounter: Payer: Self-pay | Admitting: Cardiovascular Disease

## 2015-02-27 ENCOUNTER — Ambulatory Visit (INDEPENDENT_AMBULATORY_CARE_PROVIDER_SITE_OTHER): Payer: Medicare Other | Admitting: Cardiovascular Disease

## 2015-02-27 ENCOUNTER — Ambulatory Visit (INDEPENDENT_AMBULATORY_CARE_PROVIDER_SITE_OTHER): Payer: Medicare Other | Admitting: Urology

## 2015-02-27 VITALS — BP 86/58 | HR 56 | Ht 74.0 in | Wt 209.0 lb

## 2015-02-27 VITALS — BP 123/91 | HR 65 | Ht 74.0 in | Wt 209.0 lb

## 2015-02-27 DIAGNOSIS — R32 Unspecified urinary incontinence: Secondary | ICD-10-CM | POA: Insufficient documentation

## 2015-02-27 DIAGNOSIS — Z01810 Encounter for preprocedural cardiovascular examination: Secondary | ICD-10-CM | POA: Diagnosis not present

## 2015-02-27 DIAGNOSIS — Z8619 Personal history of other infectious and parasitic diseases: Secondary | ICD-10-CM | POA: Diagnosis not present

## 2015-02-27 DIAGNOSIS — C61 Malignant neoplasm of prostate: Secondary | ICD-10-CM | POA: Diagnosis not present

## 2015-02-27 DIAGNOSIS — N3001 Acute cystitis with hematuria: Secondary | ICD-10-CM

## 2015-02-27 DIAGNOSIS — I481 Persistent atrial fibrillation: Secondary | ICD-10-CM | POA: Diagnosis not present

## 2015-02-27 DIAGNOSIS — C679 Malignant neoplasm of bladder, unspecified: Secondary | ICD-10-CM | POA: Diagnosis not present

## 2015-02-27 DIAGNOSIS — I309 Acute pericarditis, unspecified: Secondary | ICD-10-CM | POA: Diagnosis not present

## 2015-02-27 DIAGNOSIS — Z01818 Encounter for other preprocedural examination: Secondary | ICD-10-CM

## 2015-02-27 DIAGNOSIS — I10 Essential (primary) hypertension: Secondary | ICD-10-CM

## 2015-02-27 DIAGNOSIS — I4819 Other persistent atrial fibrillation: Secondary | ICD-10-CM | POA: Insufficient documentation

## 2015-02-27 LAB — URINALYSIS, COMPLETE
BILIRUBIN UA: NEGATIVE
Glucose, UA: NEGATIVE
Ketones, UA: NEGATIVE
Nitrite, UA: NEGATIVE
SPEC GRAV UA: 1.015 (ref 1.005–1.030)
UUROB: 0.2 mg/dL (ref 0.2–1.0)
pH, UA: 8.5 — ABNORMAL HIGH (ref 5.0–7.5)

## 2015-02-27 LAB — MICROSCOPIC EXAMINATION: RBC, UA: >30 /hpf — AB (ref 0–?)

## 2015-02-27 MED ORDER — LEUPROLIDE ACETATE (6 MONTH) 45 MG IM KIT: 45.0000 mg | PACK | INTRAMUSCULAR | Status: DC

## 2015-02-27 MED ORDER — AMLODIPINE BESYLATE 2.5 MG PO TABS: 2.5000 mg | ORAL_TABLET | Freq: Every day | ORAL | Status: DC

## 2015-02-27 MED ORDER — LEUPROLIDE ACETATE (6 MONTH) 45 MG IM KIT
45.0000 mg | PACK | Freq: Once | INTRAMUSCULAR | Status: AC
  Administered 2015-02-27: 45 mg via INTRAMUSCULAR

## 2015-02-27 MED ORDER — AMIODARONE HCL 200 MG PO TABS: 200.0000 mg | ORAL_TABLET | Freq: Every day | ORAL | Status: DC

## 2015-02-27 NOTE — Progress Notes (Signed)
02/27/2015 8:36 AM   [name] May 30, 1941 [id number]  Referring provider: Alvester Morin, MD [place]. J[place], [place] [address]  No chief complaint on file.   HPI: Mr. Freiberger is a 74 year old African-American male who presents today for follow-up after being hospitalized for septic shock on 02/13/2015 believed to have been the result of a urinary tract infection.Marland Kitchen He was discharged from hospital 02/17/2015.  He has a long-standing history of bilateral hydronephrosis and has undergone ureteral stent placement and exchanges in the past. The ureteral stents were originally  placed in 2013 by Dr. 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 During his admission in October 2015 there was a discussion between the urologist on call and the patient's nephrologist about the necessity of having ureteral stents indwelling. At that time, it was decided that the ureteral stents should come out and not be replaced. Upon review of the records, I could not find a reason why the stents were not removed at that time. It does seem that the patient has various social economic factors and other medical issues working against him.  He was hospitalized again in April 2016 and was once again noted to have bilateral hydronephrosis. At that time, he had other pressing medical issues and the stent removal was not addressed at that time.    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 It was demonstrated he had metastatic disease on a CT scan and bone scan in 2012.  He has been receiving Lupron injections every 6 months. His last PSA was 2.15 on 01/30/2015.  Patient has a history of bladder cancer as well.  Reviewing the records, it appears  he underwent surveillance cystoscopy in November 2015. I do not believe any recurrence was noted.  There is a pathology report from 2013 which noted papillary urothelial carcinoma, low-grade.     PMH: Past Medical History  Diagnosis Date  . PAF (paroxysmal atrial fibrillation)     a. new onset 12/2014 in the setting of UTI, sepsis, hypotension, and anemia; b. not on long term anticoagulation given anemia; c. family aware of stroke risk, they are ok with this; d. on amiodarone   . Chronic combined systolic and diastolic CHF (congestive heart failure)     a. echo 12/2014: EF 25-30%, anterior wall wall motion abnormalities; b. planned ischemic evaluation once patient is stable medically; c. echo 01/2015: EF 45-50%, no RWMA, mild AT, LA mildly dilated, mod pericardial effusion along LV free wall, no evidence of hemodynamic compromise  . ESRD on hemodialysis     a. Tuesday, Thursday, and Saturdays  . Anemia     a. baseline hgb ~ 8  . History of small bowel obstruction     a. 01/2015  . History of septic shock     a. 01/2015  . GERD (gastroesophageal reflux disease)   . Allergic rhinitis   . COPD (chronic obstructive pulmonary disease)   . Hypercholesteremia   . Cognitive communication deficit   . Magnesium deficiency   . Dementia   . Bladder cancer   . Prostate cancer   . Chronic indwelling Foley catheter   . Iliac aneurysm   . Incontinence   . Hydronephrosis   . Overactive bladder   . Detrusor sphincter dyssynergia  Surgical History: Past Surgical History  Procedure Laterality Date  . Cystoscopy w/ ureteral stent placement    . Transurethral resection of bladder tumor with gyrus (turbt-gyrus)    . Ureteral stent placement    . Prostate surgery      Removal    Home Medications:    Medication List       This list is accurate as of: 02/27/15  8:36 AM.  Always use your most recent med list.               albuterol 108 (90 BASE) MCG/ACT inhaler  Commonly known as:   PROVENTIL HFA;VENTOLIN HFA  Inhale 2 puffs into the lungs every 4 (four) hours as needed for wheezing or shortness of breath (Every 4 to 6 hours).     albuterol (2.5 MG/3ML) 0.083% nebulizer solution  Commonly known as:  PROVENTIL  Take 2.5 mg by nebulization every 6 (six) hours as needed for wheezing or shortness of breath.     amiodarone 400 MG tablet  Commonly known as:  PACERONE  Take 1 tablet (400 mg total) by mouth 2 (two) times daily.     amiodarone 200 MG tablet  Commonly known as:  PACERONE  Take 1 tablet (200 mg total) by mouth daily.     amLODipine 5 MG tablet  Commonly known as:  NORVASC  Take 5 mg by mouth daily.     atorvastatin 40 MG tablet  Commonly known as:  LIPITOR  Take 1 tablet (40 mg total) by mouth daily.     budesonide-formoterol 160-4.5 MCG/ACT inhaler  Commonly known as:  SYMBICORT  Inhale 2 puffs into the lungs 2 (two) times daily.     chlorpheniramine 4 MG tablet  Commonly known as:  CHLOR-TRIMETON  Take 4 mg by mouth every 4 (four) hours as needed for allergies.     feeding supplement (NEPRO CARB STEADY) Liqd  Take 237 mLs by mouth 2 (two) times daily between meals.     guaifenesin 100 MG/5ML syrup  Commonly known as:  ROBITUSSIN  Take 200 mg by mouth 3 (three) times daily as needed for cough.     lidocaine-prilocaine cream  Commonly known as:  EMLA     loperamide 2 MG tablet  Commonly known as:  IMODIUM A-D  Take 2 mg by mouth every 4 (four) hours as needed for diarrhea or loose stools.     magnesium oxide 400 MG tablet  Commonly known as:  MAG-OX  Take 400 mg by mouth at bedtime.     multivitamin Tabs tablet  Take 1 tablet by mouth daily.     pantoprazole 40 MG tablet  Commonly known as:  PROTONIX  Take 1 tablet (40 mg total) by mouth daily.     sevelamer carbonate 800 MG tablet  Commonly known as:  RENVELA  Take 800 mg by mouth 3 (three) times daily with meals.     sulfamethoxazole-trimethoprim 800-160 MG per tablet    Commonly known as:  BACTRIM DS,SEPTRA DS  Take 2 tablets by mouth daily.     Tiotropium Bromide Monohydrate 2.5 MCG/ACT Aers  Inhale 1 puff into the lungs daily.     tiotropium 18 MCG inhalation capsule  Commonly known as:  SPIRIVA  Place 1 capsule (18 mcg total) into inhaler and inhale daily.        Allergies:  Allergies  Allergen Reactions  . Penicillins Hives, Itching and Swelling    Whelps, itching, and swelling  Family History: Family History  Problem Relation Age of Onset  . Family history unknown: Yes    Social History:  reports that he quit smoking about 6 weeks ago. He has never used smokeless tobacco. He reports that he does not drink alcohol or use illicit drugs.  ROS: Urological Symptom Review  Patient is experiencing the following symptoms: Urinary tract infection   Review of Systems  Gastrointestinal (upper)  : Negative for upper GI symptoms  Gastrointestinal (lower) : Negative for lower GI symptoms  Constitutional : Negative for symptoms  Skin: Negative for skin symptoms  Eyes: Negative for eye symptoms  Ear/Nose/Throat : Negative for Ear/Nose/Throat symptoms  Hematologic/Lymphatic: Negative for Hematologic/Lymphatic symptoms  Cardiovascular : Negative for cardiovascular symptoms  Respiratory : Negative for respiratory symptoms  Endocrine: Negative for endocrine symptoms  Musculoskeletal: Negative for musculoskeletal symptoms  Neurological: Negative for neurological symptoms  Psychologic: Negative for psychiatric symptoms   Physical Exam: There were no vitals taken for this visit.  Constitutional:  Alert and oriented, No acute distress. HEENT: [place] AT, moist mucus membranes.  Trachea midline, no masses. Cardiovascular: No clubbing, cyanosis, or edema. Respiratory: Normal respiratory effort, no increased work of breathing. GI: Abdomen is soft, non tender, non distended, no abdominal masses GU: No CVA tenderness.   Skin: No rashes, bruises or suspicious lesions. Lymph: No cervical or inguinal adenopathy. Neurologic: Grossly intact, no focal deficits, moving all 4 extremities. Psychiatric: Normal mood and affect.  Laboratory Data: Lab Results  Component Value Date   WBC 7.6 02/17/2015   HGB 8.0* 02/17/2015   HCT 24.5* 02/17/2015   MCV 87.6 02/17/2015   PLT 224 02/17/2015    Lab Results  Component Value Date   CREATININE 3.40* 02/15/2015    Lab Results  Component Value Date   PSA 2.15 01/30/2015   PSA 0.8 09/05/2011    Lab Results  Component Value Date   TESTOSTERONE 28* 01/30/2015    No results found for: HGBA1C  Urinalysis No results found for: COLORURINE, APPEARANCEUR, LABSPEC, PHURINE, GLUCOSEU, HGBUR, BILIRUBINUR, KETONESUR, PROTEINUR, UROBILINOGEN, NITRITE, LEUKOCYTESUR  Pertinent Imaging: ADDENDUM REPORT: 01/19/2015 16:52  ADDENDUM: When directly comparing to recent CT, the hydronephrosis in the right kidney is moderately improved, particularly in the interpolar region and lower pole collecting system. The hydronephrosis in the left kidney is mildly improved.  Notably, both kidneys are likely mildly improved when compared to CT dated 06/24/2014, indicating that the overall appearance is chronic.   Electronically Signed  By: Julian Hy M.D.  On: 01/19/2015 16:52      Study Result     CLINICAL DATA: Bilateral hydronephrosis  EXAM: RENAL / URINARY TRACT ULTRASOUND COMPLETE  COMPARISON: CT abdomen pelvis dated 01/18/2015  FINDINGS: Right Kidney:  Length: 11.7 cm. Moderate hydronephrosis. Indwelling ureteral stent.  Left Kidney:  Length: 11.9 cm. Moderate hydronephrosis. Indwelling ureteral stent.  Bladder:  Decompressed by indwelling Foley catheter.  IMPRESSION: Moderate hydronephrosis with indwelling ureteral stents bilaterally.  Bladder decompressed by indwelling Foley catheter.  Electronically Signed: By:  Julian Hy M.D. On: 01/19/2015 16:23      Assessment & Plan:   1. Recent hospitalization for sepsis caused by urinary tract infection- Mr. Gelles is a 74 year old African-American male who presents today for follow-up after being hospitalized for septic shock on 02/13/2015 believed to have been the result of a urinary tract infection.Marland Kitchen He was discharged from hospital 02/17/2015.  He has a long-standing history of bilateral hydronephrosis and has undergone ureteral stent placement and exchanges in the past.  The ureteral stents were originally  placed in 2013 by Dr. 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 During his admission in October 2015 there was a discussion between the urologist on call and the patient's nephrologist about the necessity of having ureteral stents indwelling. At that time, it was decided that the ureteral stents should come out and not be replaced. Upon review of the records, I could not find a reason why the stents were not removed at that time. It does seem that the patient has various social economic factors and other medical issues working against him.  He was hospitalized again in April 2016 and was once again noted to have bilateral hydronephrosis. At that time, he had other pressing medical issues and the stent removal was not addressed at that time.    It is believed that his ureteral stents are nidus for infection they are most likely encrusted at this stage. I discussed this with the patient and if it is safe to do so, patient is agreeable to undergo cystoscopy with bilateral ureteral stent removal in the OR. I explained the procedure and its risks to the patient, such as: infection, bleeding and a possibility of not being able to remove the stents during the  procedure due to encrustation.  I also explained the risks of general anesthesia, such as: MI, CVA, paralysis, coma and/or death.  We'll obtain cardiac clearance prior to the procedure.  2. Bladder carcinoma-  Patient has a history of bladder cancer as well.  Reviewing the records, it appears he underwent surveillance cystoscopy in November 2015. I do not believe any recurrence was noted.  There is a pathology report from 2013 which noted papillary urothelial carcinoma, low-grade.  Patient will be undergoing a surveillance cystoscopy at the time of the bilateral ureteral stent removal if he receives cardiac clearance.  If a lesion is identified it may be biopsied. The risks of a possible biopsy explained to the patient, such as: Infection, bleeding and/or rarely, bladder perforation.  3. 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 It was demonstrated he had metastatic disease on a CT scan and bone scan in 2012.  He has been receiving Lupron injections every 6 months. His last PSA was 2.15 on 01/30/2015.  Patient received a 6 month Lupron at today's visit. He will return in 6 months for his next Lupron injection and we will obtain a PSA at that visit.  4. Urinary incontinence- patient did undergo IUDS approximately 3 years ago and was found to have a high pressure bladder. A bladder augmentation or diversion was considered, but due to his co morbidities it was felt that surgical intervention would be very risky. He did have a large residual on today's exam at 400 cc, but he had not voided prior to the study so this is not a true PVR.         No Follow-up on file.  Zara Council, [facility] [address] [place], [place] [address] [phone]

## 2015-02-27 NOTE — Assessment & Plan Note (Signed)
Unfortunately, the patient has multiple comorbidities including chronic systolic heart failure, persistent atrial fibrillation, end-stage renal disease and significant anemia. He had recurrent hospitalizations in the last few months and is severely deconditioned with poor functional capacity. Thus, he is going to be at least at moderate risk for any procedure or surgery water with general anesthesia or moderate sedation. The patient needs to have ureteral stents removed in order to prevent recurrent urinary tract infections and sepsis which has been life-threatening recently. Thus, I think we have to look at the risks and benefits. I ordered an echocardiogram to ensure that the pericardial effusion has not progressed and to evaluate his LV systolic function. If ejection fraction is not severely reduced and the pericardial effusion is not more than moderate, then I think it's reasonable to proceed with surgery with the understanding that he is going to be at least at moderate to high risk cardiovascular complications. The patient seems to understand. I don't think ischemic cardiac evaluation is helpful at the present time.

## 2015-02-27 NOTE — Progress Notes (Signed)
HPI  This is a pleasant 74 year old African-American male who is here today for a follow-up visit regarding chronic systolic heart failure and persistent atrial fibrillation maintained in sinus rhythm with amiodarone. He has end-stage renal disease on hemodialysis. He was hospitalized in April for atrial fibrillation with rapid ventricular response in the setting of sepsis related to urinary tract infection and anemia. He had an echocardiogram done which showed an ejection fraction of 25-30% with anterior wall motion abnormality. He subsequently improved with antibodies. He had a repeat echocardiogram in May which showed improvement in LV systolic function to 05-39% with moderate size pericardial effusion. The patient was hospitalized again in May for worsening anemia. He was seen by gastroenterology and was felt to be too high risk for endoscopy given his multiple comorbidities. His most recent hemoglobin was 8.  She was seen by Dr. Erlene Quan for recurrent hydronephrosis and urinary tract infection which is suspected to be due to indwelling ureteral stents. Removal of the stents was recommended. The patient is here today for preoperative cardiovascular evaluation. He is currently is staying at 3 happen would be going home tomorrow. He had severe deconditioning related to his recent hospitalizations. He is mostly wheelchair bound but getting physical therapy in order to be able to walk with a walker again. He denies any chest pain, shortness of breath or palpitations. He is mildly hypotensive. Denies any dizziness.  Allergies  Allergen Reactions  . Penicillins Hives, Itching and Swelling    Whelps, itching, and swelling     Current Outpatient Prescriptions on File Prior to Visit  Medication Sig Dispense Refill  . albuterol (PROVENTIL HFA;VENTOLIN HFA) 108 (90 BASE) MCG/ACT inhaler Inhale 2 puffs into the lungs every 4 (four) hours as needed for wheezing or shortness of breath (Every 4 to 6 hours).      Marland Kitchen albuterol (PROVENTIL) (2.5 MG/3ML) 0.083% nebulizer solution Take 2.5 mg by nebulization every 6 (six) hours as needed for wheezing or shortness of breath.    Marland Kitchen atorvastatin (LIPITOR) 40 MG tablet Take 1 tablet (40 mg total) by mouth daily. 30 tablet 1  . budesonide-formoterol (SYMBICORT) 160-4.5 MCG/ACT inhaler Inhale 2 puffs into the lungs 2 (two) times daily.    . chlorpheniramine (CHLOR-TRIMETON) 4 MG tablet Take 4 mg by mouth every 4 (four) hours as needed for allergies.    Marland Kitchen guaifenesin (ROBITUSSIN) 100 MG/5ML syrup Take 200 mg by mouth 3 (three) times daily as needed for cough.    . loperamide (IMODIUM A-D) 2 MG tablet Take 2 mg by mouth every 4 (four) hours as needed for diarrhea or loose stools.    . magnesium oxide (MAG-OX) 400 MG tablet Take 400 mg by mouth at bedtime.    . multivitamin (RENA-VIT) TABS tablet Take 1 tablet by mouth daily.    . Nutritional Supplements (FEEDING SUPPLEMENT, NEPRO CARB STEADY,) LIQD Take 237 mLs by mouth 2 (two) times daily between meals. 30 Can 0  . pantoprazole (PROTONIX) 40 MG tablet Take 1 tablet (40 mg total) by mouth daily. 30 tablet 0  . sevelamer carbonate (RENVELA) 800 MG tablet Take 800 mg by mouth 3 (three) times daily with meals.     . tiotropium (SPIRIVA) 18 MCG inhalation capsule Place 1 capsule (18 mcg total) into inhaler and inhale daily. 30 capsule 12  . Tiotropium Bromide Monohydrate 2.5 MCG/ACT AERS Inhale 1 puff into the lungs daily.     No current facility-administered medications on file prior to visit.  Past Medical History  Diagnosis Date  . PAF (paroxysmal atrial fibrillation)     a. new onset 12/2014 in the setting of UTI, sepsis, hypotension, and anemia; b. not on long term anticoagulation given anemia; c. family aware of stroke risk, they are ok with this; d. on amiodarone   . Chronic combined systolic and diastolic CHF (congestive heart failure)     a. echo 12/2014: EF 25-30%, anterior wall wall motion abnormalities; b.  planned ischemic evaluation once patient is stable medically; c. echo 01/2015: EF 45-50%, no RWMA, mild AT, LA mildly dilated, mod pericardial effusion along LV free wall, no evidence of hemodynamic compromise  . ESRD on hemodialysis     a. Tuesday, Thursday, and Saturdays  . Anemia     a. baseline hgb ~ 8  . History of small bowel obstruction     a. 01/2015  . History of septic shock     a. 01/2015  . GERD (gastroesophageal reflux disease)   . Allergic rhinitis   . COPD (chronic obstructive pulmonary disease)   . Hypercholesteremia   . Cognitive communication deficit   . Magnesium deficiency   . Dementia   . Chronic indwelling Foley catheter   . Iliac aneurysm   . Incontinence   . Hydronephrosis   . Overactive bladder   . Detrusor sphincter dyssynergia   . Mini stroke   . Bladder cancer   . Prostate cancer      Past Surgical History  Procedure Laterality Date  . Cystoscopy w/ ureteral stent placement    . Transurethral resection of bladder tumor with gyrus (turbt-gyrus)    . Ureteral stent placement    . Prostate surgery      Removal     Family History  Problem Relation Age of Onset  . Stroke Mother      History   Social History  . Marital Status: Single    Spouse Name: N/A  . Number of Children: N/A  . Years of Education: N/A   Occupational History  . Not on file.   Social History Main Topics  . Smoking status: Former Smoker -- 54 years    Quit date: 01/11/2015  . Smokeless tobacco: Never Used  . Alcohol Use: No  . Drug Use: No  . Sexual Activity: Not on file   Other Topics Concern  . Not on file   Social History Narrative       PHYSICAL EXAM   BP 86/58 mmHg  Pulse 56  Ht 6\' 2"  (1.88 m)  Wt 209 lb (94.802 kg)  BMI 26.82 kg/m2 Constitutional: He is oriented to person, place, and time. He appears well-developed and well-nourished. No distress.  HENT: No nasal discharge.  Head: Normocephalic and atraumatic.  Eyes: Pupils are equal and  round.  No discharge. Neck: Normal range of motion. Neck supple. No JVD present. No thyromegaly present.  Cardiovascular: Normal rate, regular rhythm, normal heart sounds. Exam reveals no gallop and no friction rub. No murmur heard.  Pulmonary/Chest: Effort normal and breath sounds normal. No stridor. No respiratory distress. He has no wheezes. He has no rales. He exhibits no tenderness.  Abdominal: Soft. Bowel sounds are normal. He exhibits no distension. There is no tenderness. There is no rebound and no guarding.  Musculoskeletal: Normal range of motion. He exhibits +1 edema and no tenderness.  Neurological: He is alert and oriented to person, place, and time. Coordination normal.  Skin: Skin is warm and dry. No rash noted. He  is not diaphoretic. No erythema. No pallor.  Psychiatric: He has a normal mood and affect. His behavior is normal. Judgment and thought content normal.       BWL:SLHTD  Bradycardia  Low voltage in precordial leads.   -Anterior infarct -age undetermined.   ABNORMAL    ASSESSMENT AND PLAN

## 2015-02-27 NOTE — Patient Instructions (Addendum)
Medication Instructions:  Your physician has recommended you make the following change in your medication:  1) STOP taking amiodarone 400mg  2) START taking amiodarone 200mg  once per day 3) STOP taking amlodipine 5mg  4) START taking amlodipine 2.5mg  once per day   Labwork: none  Testing/Procedures: Your physician has requested that you have an echocardiogram. Echocardiography is a painless test that uses sound waves to create images of your heart. It provides your doctor with information about the size and shape of your heart and how well your heart's chambers and valves are working. This procedure takes approximately one hour. There are no restrictions for this procedure.    Follow-Up: Your physician recommends that you schedule a follow-up appointment in: two months with Dr. Fletcher Anon.    Any Other Special Instructions Will Be Listed Below (If Applicable).

## 2015-02-27 NOTE — Progress Notes (Signed)
In and Out Catheterization  Patient is present today for a I & O catheterization due to urine retention. Patient was cleaned and prepped in a sterile fashion with betadine and Lidocaine 2% jelly was instilled into the urethra.  A 14FR cath was inserted no complications were noted, 400 ml of urine return was noted, urine was burgundy in color. A clean urine sample was collected for analysis and culture. Bladder was drained  And catheter was removed with out difficulty.    Preformed by: Golden Hurter, CMA

## 2015-02-27 NOTE — Assessment & Plan Note (Signed)
He is maintaining in sinus rhythm but bradycardic. I decreased the dose of amiodarone to 200 mg once daily. He is not a candidate for anticoagulation due to ongoing anemia.

## 2015-02-27 NOTE — Assessment & Plan Note (Signed)
Blood pressure is low. I decreased the dose of amlodipine to 2.5 mg once daily.

## 2015-02-27 NOTE — Patient Instructions (Signed)
We have scheduled you for surgery to have ureteral stents removed if he received cardiac clearance on 03/13/2015 if during that procedure a bladder lesion is identified that may also be biopsied.

## 2015-03-01 ENCOUNTER — Telehealth: Payer: Self-pay | Admitting: *Deleted

## 2015-03-01 ENCOUNTER — Telehealth: Payer: Self-pay | Admitting: Urology

## 2015-03-01 LAB — CULTURE, URINE COMPREHENSIVE

## 2015-03-01 NOTE — Telephone Encounter (Signed)
Sarah from Urology calling Following up on a clearance they sent with the patient on 02/27/15 They had seen the patient that morning and pt came to see [place] afterwards.  Please call her when the clearance is done.

## 2015-03-01 NOTE — Telephone Encounter (Signed)
S/w sarah and notified her that patient will have echo 6/13 and will follow up regarding cardiac clearance after echo read.

## 2015-03-01 NOTE — Telephone Encounter (Signed)
Judson Roch has spoken with the patient.  He has dialysis on Tuesdays, Thursdays and Saturdays.  She will talk about when to get him in the office with you tomorrow.

## 2015-03-01 NOTE — Telephone Encounter (Signed)
This patient is relatively high risk for anesthesia given his co- morbidities.  I reviewed his most recent Xray and the stents do not appear particularly encrusted suprisingly.  I would be willing to at least attempt to remove his stents in clinic if he his willing.    He will need double abx for the procedure (IM gent and cipro) given his history of UTI/ sepsis.  Most recent urine culture negative.    Please call him and let him know then forward note to arrange office procedure.    If we fail, will proceed with stent removal in OR.  Hollice Espy, MD

## 2015-03-06 ENCOUNTER — Ambulatory Visit (INDEPENDENT_AMBULATORY_CARE_PROVIDER_SITE_OTHER): Payer: Medicare Other

## 2015-03-06 ENCOUNTER — Other Ambulatory Visit: Payer: Self-pay

## 2015-03-06 DIAGNOSIS — I309 Acute pericarditis, unspecified: Secondary | ICD-10-CM | POA: Diagnosis not present

## 2015-03-06 NOTE — Telephone Encounter (Signed)
After much back and forth with patient and his cousin [name] we have scheduled [name] for a Cysto stent removal in office on Thursday @1 :30pm time subject to change based on litho schedule. We will keep surgery as scheduled incase we are unable to remove the stents in office. Patient has an apt for an Eko with Cardio on 03-06-15 for his clearance and they will call to notify us of the results and if he will be cleared./SW

## 2015-03-09 ENCOUNTER — Inpatient Hospital Stay: Admission: RE | Admit: 2015-03-09 | Payer: Medicare Other | Source: Ambulatory Visit

## 2015-03-09 ENCOUNTER — Encounter: Payer: Self-pay | Admitting: Urology

## 2015-03-09 ENCOUNTER — Ambulatory Visit (INDEPENDENT_AMBULATORY_CARE_PROVIDER_SITE_OTHER): Payer: Medicare Other | Admitting: Urology

## 2015-03-09 VITALS — BP 101/60 | HR 76 | Ht 74.0 in | Wt 207.0 lb

## 2015-03-09 DIAGNOSIS — Z960 Presence of urogenital implants: Secondary | ICD-10-CM

## 2015-03-09 MED ORDER — GENTAMICIN SULFATE 40 MG/ML IJ SOLN
80.0000 mg | Freq: Once | INTRAMUSCULAR | Status: AC
  Administered 2015-03-09: 80 mg via INTRAMUSCULAR

## 2015-03-09 MED ORDER — CIPROFLOXACIN HCL 500 MG PO TABS
500.0000 mg | ORAL_TABLET | Freq: Once | ORAL | Status: AC
  Administered 2015-03-09: 500 mg via ORAL

## 2015-03-09 MED ORDER — LIDOCAINE HCL 2 % EX GEL
1.0000 "application " | Freq: Once | CUTANEOUS | Status: AC
  Administered 2015-03-09: 1 via URETHRAL

## 2015-03-09 MED ORDER — SULFAMETHOXAZOLE-TRIMETHOPRIM 800-160 MG PO TABS: 1.0000 | ORAL_TABLET | Freq: Every day | ORAL | Status: DC

## 2015-03-09 NOTE — Progress Notes (Signed)
2:47 PM   [name] 06-Dec-1940 [id number]  Referring provider: Alvester Morin, MD [address], [place] [address]  Chief Complaint  Patient presents with  . Cysto Stent removal       HPI: [name] is a 74 year old African-American male who presents today for stent removal.  Review of previous imaging shows no signficant stent encurstation.    He was initially seen in follow-up after being hospitalized for septic shock on 02/13/2015 believed to have been the result of a urinary tract infection.Marland Kitchen He was discharged from hospital 02/17/2015.  He has a long-standing history of bilateral hydronephrosis and has undergone ureteral stent placement and exchanges in the past. The ureteral stents were originally  placed in 2013 by Dr. 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 Ureteral stricture vs. High pressure bladder (variable documentation).  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 During his admission in October 2015 there was a discussion between the urologist on call and the patient's nephrologist about the necessity of having ureteral stents indwelling. At that time, it was decided that the ureteral stents should come out and not be replaced. Upon review of the records, I could not find a reason why the stents were not removed at that time. It does seem that the patient has various social economic factors and other medical issues working against him.  He was hospitalized again in April 2016 and was once again noted to have bilateral hydronephrosis. At that time, he had other pressing medical issues and the stent removal was not addressed at that time.    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 It was demonstrated he had metastatic disease  on a CT scan and bone scan in 2012.  He has been receiving Lupron injections every 6 months. His last PSA was 2.15 on 01/30/2015.  Patient has a history of bladder cancer as well.  Reviewing the records, it appears he underwent surveillance cystoscopy in November 2015. I do not believe any recurrence was noted.  There is a pathology report from 2013 which noted papillary urothelial carcinoma, low-grade.     PMH: Past Medical History  Diagnosis Date  . PAF (paroxysmal atrial fibrillation)     a. new onset 12/2014 in the setting of UTI, sepsis, hypotension, and anemia; b. not on long term anticoagulation given anemia; c. family aware of stroke risk, they are ok with this; d. on amiodarone   . Chronic combined systolic and diastolic CHF (congestive heart failure)     a. echo 12/2014: EF 25-30%, anterior wall wall motion abnormalities; b. planned ischemic evaluation once patient is stable medically; c. echo 01/2015: EF 45-50%, no RWMA, mild AT, LA mildly dilated, mod pericardial effusion along LV free wall, no evidence of hemodynamic compromise  . ESRD on hemodialysis     a. Tuesday, Thursday, and Saturdays  . Anemia     a. baseline hgb ~ 8  . History of small bowel obstruction     a. 01/2015  . History of septic shock     a. 01/2015  . GERD (gastroesophageal reflux disease)   . Allergic rhinitis   . COPD (chronic obstructive pulmonary disease)   . Hypercholesteremia   . Cognitive communication deficit   . Magnesium deficiency   . Dementia   .  Chronic indwelling Foley catheter   . Iliac aneurysm   . Incontinence   . Hydronephrosis   . Overactive bladder   . Detrusor sphincter dyssynergia   . Mini stroke   . Bladder cancer   . Prostate cancer     Surgical History: Past Surgical History  Procedure Laterality Date  . Cystoscopy w/ ureteral stent placement    . Transurethral resection of bladder tumor with gyrus (turbt-gyrus)    . Ureteral stent placement    . Prostate surgery       Removal    Home Medications:    Medication List       This list is accurate as of: 03/09/15  2:47 PM.  Always use your most recent med list.               albuterol 108 (90 BASE) MCG/ACT inhaler  Commonly known as:  PROVENTIL HFA;VENTOLIN HFA  Inhale 2 puffs into the lungs every 4 (four) hours as needed for wheezing or shortness of breath (Every 4 to 6 hours).     albuterol (2.5 MG/3ML) 0.083% nebulizer solution  Commonly known as:  PROVENTIL  Take 2.5 mg by nebulization every 6 (six) hours as needed for wheezing or shortness of breath.     amiodarone 200 MG tablet  Commonly known as:  PACERONE  Take 1 tablet (200 mg total) by mouth daily.     amLODipine 5 MG tablet  Commonly known as:  NORVASC     atorvastatin 40 MG tablet  Commonly known as:  LIPITOR  Take 1 tablet (40 mg total) by mouth daily.     budesonide-formoterol 160-4.5 MCG/ACT inhaler  Commonly known as:  SYMBICORT  Inhale 2 puffs into the lungs 2 (two) times daily.     chlorpheniramine 4 MG tablet  Commonly known as:  CHLOR-TRIMETON  Take 4 mg by mouth every 4 (four) hours as needed for allergies.     feeding supplement (NEPRO CARB STEADY) Liqd  Take 237 mLs by mouth 2 (two) times daily between meals.     guaifenesin 100 MG/5ML syrup  Commonly known as:  ROBITUSSIN  Take 200 mg by mouth 3 (three) times daily as needed for cough.     loperamide 2 MG tablet  Commonly known as:  IMODIUM A-D  Take 2 mg by mouth every 4 (four) hours as needed for diarrhea or loose stools.     magnesium oxide 400 MG tablet  Commonly known as:  MAG-OX  Take 400 mg by mouth at bedtime.     multivitamin Tabs tablet  Take 1 tablet by mouth daily.     pantoprazole 40 MG tablet  Commonly known as:  PROTONIX  Take 1 tablet (40 mg total) by mouth daily.     sevelamer carbonate 800 MG tablet  Commonly known as:  RENVELA  Take 800 mg by mouth 3 (three) times daily with meals.     Tiotropium Bromide Monohydrate 2.5 MCG/ACT  Aers  Inhale 1 puff into the lungs daily.     tiotropium 18 MCG inhalation capsule  Commonly known as:  SPIRIVA  Place 1 capsule (18 mcg total) into inhaler and inhale daily.        Allergies:  Allergies  Allergen Reactions  . Penicillins Hives, Itching and Swelling    Whelps, itching, and swelling    Family History: Family History  Problem Relation Age of Onset  . Stroke Mother     Social History:  reports that he  quit smoking about 8 weeks ago. He has never used smokeless tobacco. He reports that he does not drink alcohol or use illicit drugs.   Physical Exam: BP 101/60 mmHg  Pulse 76  Ht 6\' 2"  (1.88 m)  Wt 207 lb (93.895 kg)  BMI 26.57 kg/m2  Constitutional:  Alert and oriented, No acute distress. HEENT: [place] AT, moist mucus membranes.  Trachea midline, no masses. Cardiovascular: No clubbing, cyanosis, or edema. Respiratory: Normal respiratory effort, no increased work of breathing. GI: Abdomen is soft, non tender, non distended, no abdominal masses GU: No CVA tenderness. Circumcised phallus.  Scrotum unremarkable.   Skin: No rashes, bruises or suspicious lesions. Neurologic: Grossly intact, no focal deficits, moving all 4 extremities. Psychiatric: Normal mood and affect.  Laboratory Data: Lab Results  Component Value Date   WBC 7.6 02/17/2015   HGB 8.0* 02/17/2015   HCT 24.5* 02/17/2015   MCV 87.6 02/17/2015   PLT 224 02/17/2015    Lab Results  Component Value Date   CREATININE 3.40* 02/15/2015    Lab Results  Component Value Date   PSA 2.15 01/30/2015   PSA 0.8 09/05/2011    Lab Results  Component Value Date   TESTOSTERONE 28* 01/30/2015    No results found for: HGBA1C  Urinalysis    Component Value Date/Time   GLUCOSEU Negative 02/27/2015 0930   BILIRUBINUR Negative 02/27/2015 0930   NITRITE Negative 02/27/2015 0930   LEUKOCYTESUR 2+* 02/27/2015 0930    Pertinent Imaging: ADDENDUM REPORT: 01/19/2015 16:52  ADDENDUM: When directly  comparing to recent CT, the hydronephrosis in the right kidney is moderately improved, particularly in the interpolar region and lower pole collecting system. The hydronephrosis in the left kidney is mildly improved.  Notably, both kidneys are likely mildly improved when compared to CT dated 06/24/2014, indicating that the overall appearance is chronic.   Electronically Signed  By: Julian Hy M.D.  On: 01/19/2015 16:52      Study Result     CLINICAL DATA: Bilateral hydronephrosis  EXAM: RENAL / URINARY TRACT ULTRASOUND COMPLETE  COMPARISON: CT abdomen pelvis dated 01/18/2015  FINDINGS: Right Kidney:  Length: 11.7 cm. Moderate hydronephrosis. Indwelling ureteral stent.  Left Kidney:  Length: 11.9 cm. Moderate hydronephrosis. Indwelling ureteral stent.  Bladder:  Decompressed by indwelling Foley catheter.  IMPRESSION: Moderate hydronephrosis with indwelling ureteral stents bilaterally.  Bladder decompressed by indwelling Foley catheter.  Electronically Signed: By: Julian Hy M.D. On: 01/19/2015 16:23    Procedure:  Cystoscopy/ Stent removal procedure   Patient identification was confirmed, informed consent was obtained, and patient was prepped using Betadine solution.  Lidocaine jelly was administered per urethral meatus.    Preoperative abx where received prior to procedure in the form of cipro/ gent.  Procedure: - Flexible cystoscope introduced, without any difficulty.   - Thorough search of the bladder revealed:    normal urethral meatus  Metal Stent seen emanating from bilateral ureteral orifice.  The ? Left stent grasped with stent graspers, and removed in entirety.    Only one of 2 of the stents is able to be removed today. Fortunately, given that the stents are metal, the graspers were unable to gain adequate purchase on the second ureteral stent, most likely the right-sided today. After multiple tries, we elected to  abort and return in 1 week with a snare or basket to grab the remaining stent.  Moderate debris in bladder limiting visualization.  Post-Procedure: - Patient tolerated the procedure well  Assessment & Plan:  1. Recent hospitalization for sepsis caused by urinary tract infection- ? ureteral stents are nidus for infection s/p one of two stents removed today.  Given this significant manipulation today and history of urosepsis, we will send home on Bactrim 3 days as prophylaxis. Discussed dosing for hemodialysis patients with pharmacy today. Plan to have patient return in 1 week to attempt to remove the remaining residual stent.  2. Bladder carcinoma-  Patient has a history of bladder cancer as well.  Reviewing the records, it appears he underwent surveillance cystoscopy in November 2015. I do not believe any recurrence was noted.  There is a pathology report from 2013 which noted papillary urothelial carcinoma, low-grade.  Cysto limited today, will need repeat surveillance cystoscopy.  3. 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 It was demonstrated he had metastatic disease on a CT scan and bone scan in 2012.  He has been receiving Lupron injections every 6 months. His last PSA was 2.15 on 01/30/2015.  6 month Lupron given at last visit.  4. Urinary incontinence- patient did undergo IUDS approximately 3 years ago and was found to have a high pressure bladder. A bladder augmentation or diversion was considered, but due to his co morbidities it was felt that surgical intervention would be very risky. He did have a large residual on today's exam at 400 cc, but he had not voided prior to the study so this is not a true PVR.     f/u 1 week for stent removal.  Hollice Espy, MD  [facility] [address] [place], [place] [address] [phone]

## 2015-03-09 NOTE — Progress Notes (Signed)
IM Injection  Patient is present today for an IM Injection prior to Cysto Drug: Gentamicin Dose:80mg /33ml Location: Right upper outer buttocks Lot: [id number] Exp:02/17 Patient tolerated well, no complications were noted  Preformed by: Judson Roch watts, CMA  Cipro 500mg  #1 tablet oral was given prior to the procedure

## 2015-03-13 ENCOUNTER — Encounter: Admission: RE | Payer: Self-pay | Source: Ambulatory Visit

## 2015-03-13 ENCOUNTER — Ambulatory Visit: Admission: RE | Admit: 2015-03-13 | Payer: Medicare Other | Source: Ambulatory Visit | Admitting: Urology

## 2015-03-13 SURGERY — REMOVAL, STENT, URETER, CYSTOSCOPIC
Anesthesia: Choice | Laterality: Bilateral

## 2015-03-14 ENCOUNTER — Ambulatory Visit: Payer: Medicare Other | Admitting: Oncology

## 2015-03-16 ENCOUNTER — Encounter: Payer: Self-pay | Admitting: Urology

## 2015-03-16 ENCOUNTER — Ambulatory Visit (INDEPENDENT_AMBULATORY_CARE_PROVIDER_SITE_OTHER): Payer: Medicare Other | Admitting: Urology

## 2015-03-16 VITALS — BP 117/70 | HR 73 | Temp 98.1°F | Ht 74.0 in

## 2015-03-16 DIAGNOSIS — Z960 Presence of urogenital implants: Secondary | ICD-10-CM | POA: Diagnosis not present

## 2015-03-16 LAB — URINALYSIS, COMPLETE
Bilirubin, UA: NEGATIVE
Glucose, UA: NEGATIVE
KETONES UA: NEGATIVE
NITRITE UA: POSITIVE — AB
SPEC GRAV UA: 1.02 (ref 1.005–1.030)
Urobilinogen, Ur: 0.2 mg/dL (ref 0.2–1.0)
pH, UA: 8.5 — ABNORMAL HIGH (ref 5.0–7.5)

## 2015-03-16 LAB — MICROSCOPIC EXAMINATION: WBC, UA: >30 /hpf — AB (ref 0–?)

## 2015-03-16 MED ORDER — CIPROFLOXACIN HCL 500 MG PO TABS: 500.0000 mg | ORAL_TABLET | Freq: Once | ORAL | Status: DC

## 2015-03-16 MED ORDER — LIDOCAINE HCL 2 % EX GEL
1.0000 "application " | Freq: Once | CUTANEOUS | Status: AC
  Administered 2015-03-16: 1 via URETHRAL

## 2015-03-16 MED ORDER — GENTAMICIN SULFATE 40 MG/ML IJ SOLN: 80.0000 mg | Freq: Once | INTRAMUSCULAR | Status: DC

## 2015-03-16 NOTE — Progress Notes (Signed)
Unable to perform cystoscopy, stent removal today due to urinary tract infection. I&O catheterization revealed greater than 30 white blood cells and nitrite positive despite taking postprocedural antibiotics (Bactrim).   Patient is currently asymptomatic without fevers or chills. Given his history of multidrug resistant organisms and need for renal dosing, we will hold off on treating urinary tract infection until we have culture and sensitivity data.  Cystoscopy, stent removal rescheduled for in 2 weeks.  Hollice Espy, MD

## 2015-03-16 NOTE — Progress Notes (Signed)
In and Out Catheterization  Patient is present today for a I & O catheterization due to Cysto/Stent removal. Patient was cleaned and prepped in a sterile fashion with betadine and Lidocaine 2% jelly was instilled into the urethra.  A 16 FR cath was inserted no complications were noted , 350 ml of urine return was noted, urine was cloudy yellow in color. A clean catch urine sample was collected for urinalysis/ urine culture. Bladder was drained  And catheter was removed with out difficulty.    Preformed by: Bonnye Fava, CMA

## 2015-03-18 ENCOUNTER — Emergency Department
Admission: EM | Admit: 2015-03-18 | Discharge: 2015-03-18 | Disposition: A | Discharge status: Home / Self Care | Payer: Medicare Other | Source: Home / Self Care | Attending: Emergency Medicine | Admitting: Emergency Medicine

## 2015-03-18 ENCOUNTER — Emergency Department: Payer: Medicare Other

## 2015-03-18 ENCOUNTER — Encounter: Payer: Self-pay | Admitting: Emergency Medicine

## 2015-03-18 ENCOUNTER — Other Ambulatory Visit: Payer: Self-pay

## 2015-03-18 DIAGNOSIS — J159 Unspecified bacterial pneumonia: Principal | ICD-10-CM | POA: Diagnosis present

## 2015-03-18 DIAGNOSIS — F17210 Nicotine dependence, cigarettes, uncomplicated: Secondary | ICD-10-CM | POA: Diagnosis present

## 2015-03-18 DIAGNOSIS — Z8679 Personal history of other diseases of the circulatory system: Secondary | ICD-10-CM

## 2015-03-18 DIAGNOSIS — I5042 Chronic combined systolic (congestive) and diastolic (congestive) heart failure: Secondary | ICD-10-CM | POA: Diagnosis present

## 2015-03-18 DIAGNOSIS — I953 Hypotension of hemodialysis: Secondary | ICD-10-CM | POA: Diagnosis present

## 2015-03-18 DIAGNOSIS — F0390 Unspecified dementia without behavioral disturbance: Secondary | ICD-10-CM | POA: Diagnosis present

## 2015-03-18 DIAGNOSIS — R1084 Generalized abdominal pain: Secondary | ICD-10-CM | POA: Insufficient documentation

## 2015-03-18 DIAGNOSIS — I728 Aneurysm of other specified arteries: Secondary | ICD-10-CM | POA: Diagnosis present

## 2015-03-18 DIAGNOSIS — Z79899 Other long term (current) drug therapy: Secondary | ICD-10-CM

## 2015-03-18 DIAGNOSIS — Z9889 Other specified postprocedural states: Secondary | ICD-10-CM

## 2015-03-18 DIAGNOSIS — R41841 Cognitive communication deficit: Secondary | ICD-10-CM | POA: Diagnosis present

## 2015-03-18 DIAGNOSIS — Z7951 Long term (current) use of inhaled steroids: Secondary | ICD-10-CM

## 2015-03-18 DIAGNOSIS — Z6826 Body mass index (BMI) 26.0-26.9, adult: Secondary | ICD-10-CM

## 2015-03-18 DIAGNOSIS — D631 Anemia in chronic kidney disease: Secondary | ICD-10-CM | POA: Diagnosis present

## 2015-03-18 DIAGNOSIS — I120 Hypertensive chronic kidney disease with stage 5 chronic kidney disease or end stage renal disease: Secondary | ICD-10-CM | POA: Diagnosis present

## 2015-03-18 DIAGNOSIS — Z87891 Personal history of nicotine dependence: Secondary | ICD-10-CM | POA: Insufficient documentation

## 2015-03-18 DIAGNOSIS — Z992 Dependence on renal dialysis: Secondary | ICD-10-CM | POA: Insufficient documentation

## 2015-03-18 DIAGNOSIS — C61 Malignant neoplasm of prostate: Secondary | ICD-10-CM | POA: Diagnosis present

## 2015-03-18 DIAGNOSIS — Z8673 Personal history of transient ischemic attack (TIA), and cerebral infarction without residual deficits: Secondary | ICD-10-CM

## 2015-03-18 DIAGNOSIS — Z880 Allergy status to penicillin: Secondary | ICD-10-CM

## 2015-03-18 DIAGNOSIS — N2581 Secondary hyperparathyroidism of renal origin: Secondary | ICD-10-CM | POA: Diagnosis present

## 2015-03-18 DIAGNOSIS — J449 Chronic obstructive pulmonary disease, unspecified: Secondary | ICD-10-CM | POA: Diagnosis present

## 2015-03-18 DIAGNOSIS — N390 Urinary tract infection, site not specified: Secondary | ICD-10-CM | POA: Diagnosis present

## 2015-03-18 DIAGNOSIS — E780 Pure hypercholesterolemia: Secondary | ICD-10-CM | POA: Diagnosis present

## 2015-03-18 DIAGNOSIS — E440 Moderate protein-calorie malnutrition: Secondary | ICD-10-CM | POA: Diagnosis present

## 2015-03-18 DIAGNOSIS — K567 Ileus, unspecified: Secondary | ICD-10-CM | POA: Diagnosis present

## 2015-03-18 DIAGNOSIS — R1110 Vomiting, unspecified: Secondary | ICD-10-CM

## 2015-03-18 DIAGNOSIS — N139 Obstructive and reflux uropathy, unspecified: Secondary | ICD-10-CM | POA: Diagnosis present

## 2015-03-18 DIAGNOSIS — N186 End stage renal disease: Secondary | ICD-10-CM | POA: Diagnosis present

## 2015-03-18 DIAGNOSIS — K219 Gastro-esophageal reflux disease without esophagitis: Secondary | ICD-10-CM | POA: Diagnosis present

## 2015-03-18 DIAGNOSIS — I5043 Acute on chronic combined systolic (congestive) and diastolic (congestive) heart failure: Secondary | ICD-10-CM | POA: Diagnosis present

## 2015-03-18 DIAGNOSIS — Z8551 Personal history of malignant neoplasm of bladder: Secondary | ICD-10-CM

## 2015-03-18 DIAGNOSIS — J189 Pneumonia, unspecified organism: Secondary | ICD-10-CM

## 2015-03-18 DIAGNOSIS — R112 Nausea with vomiting, unspecified: Secondary | ICD-10-CM | POA: Insufficient documentation

## 2015-03-18 DIAGNOSIS — Z8546 Personal history of malignant neoplasm of prostate: Secondary | ICD-10-CM

## 2015-03-18 DIAGNOSIS — I480 Paroxysmal atrial fibrillation: Secondary | ICD-10-CM | POA: Diagnosis present

## 2015-03-18 DIAGNOSIS — Y95 Nosocomial condition: Secondary | ICD-10-CM | POA: Diagnosis present

## 2015-03-18 DIAGNOSIS — Z823 Family history of stroke: Secondary | ICD-10-CM

## 2015-03-18 DIAGNOSIS — E785 Hyperlipidemia, unspecified: Secondary | ICD-10-CM | POA: Diagnosis present

## 2015-03-18 LAB — CBC WITH DIFFERENTIAL/PLATELET
BASOS ABS: 0 10*3/uL (ref 0–0.1)
Basophils Relative: 0 %
EOS ABS: 0 10*3/uL (ref 0–0.7)
Eosinophils Relative: 0 %
HCT: 24.9 % — ABNORMAL LOW (ref 40.0–52.0)
HEMOGLOBIN: 8.1 g/dL — AB (ref 13.0–18.0)
Lymphocytes Relative: 6 %
Lymphs Abs: 0.6 10*3/uL — ABNORMAL LOW (ref 1.0–3.6)
MCH: 29.9 pg (ref 26.0–34.0)
MCHC: 32.6 g/dL (ref 32.0–36.0)
MCV: 91.8 fL (ref 80.0–100.0)
MONOS PCT: 3 %
Monocytes Absolute: 0.3 10*3/uL (ref 0.2–1.0)
NEUTROS PCT: 91 %
Neutro Abs: 8.9 10*3/uL — ABNORMAL HIGH (ref 1.4–6.5)
PLATELETS: 205 10*3/uL (ref 150–440)
RBC: 2.71 MIL/uL — AB (ref 4.40–5.90)
RDW: 19.1 % — AB (ref 11.5–14.5)
WBC: 9.9 10*3/uL (ref 3.8–10.6)

## 2015-03-18 LAB — COMPREHENSIVE METABOLIC PANEL
ALBUMIN: 2.9 g/dL — AB (ref 3.5–5.0)
ALT: 6 U/L — ABNORMAL LOW (ref 17–63)
ANION GAP: 11 (ref 5–15)
AST: 12 U/L — AB (ref 15–41)
Alkaline Phosphatase: 74 U/L (ref 38–126)
BUN: 45 mg/dL — AB (ref 6–20)
CO2: 29 mmol/L (ref 22–32)
CREATININE: 7.62 mg/dL — AB (ref 0.61–1.24)
Calcium: 8.7 mg/dL — ABNORMAL LOW (ref 8.9–10.3)
Chloride: 96 mmol/L — ABNORMAL LOW (ref 101–111)
GFR calc Af Amer: 7 mL/min — ABNORMAL LOW (ref >60)
GFR, EST NON AFRICAN AMERICAN: 6 mL/min — AB (ref >60)
GLUCOSE: 137 mg/dL — AB (ref 65–99)
Potassium: 3.8 mmol/L (ref 3.5–5.1)
Sodium: 136 mmol/L (ref 135–145)
Total Bilirubin: 0.8 mg/dL (ref 0.3–1.2)
Total Protein: 6.8 g/dL (ref 6.5–8.1)

## 2015-03-18 LAB — LACTIC ACID, PLASMA
LACTIC ACID, VENOUS: 2 mmol/L (ref 0.5–2.0)
Lactic Acid, Venous: 1.4 mmol/L (ref 0.5–2.0)

## 2015-03-18 LAB — LIPASE, BLOOD: Lipase: 30 U/L (ref 22–51)

## 2015-03-18 LAB — TROPONIN I: TROPONIN I: 0.04 ng/mL — AB (ref <0.031)

## 2015-03-18 MED ORDER — LEVOFLOXACIN 250 MG PO TABS
ORAL_TABLET | ORAL | Status: AC
  Filled 2015-03-18: qty 1

## 2015-03-18 MED ORDER — IOHEXOL 300 MG/ML  SOLN
100.0000 mL | Freq: Once | INTRAMUSCULAR | Status: AC | PRN
  Administered 2015-03-18: 100 mL via INTRAVENOUS

## 2015-03-18 MED ORDER — SODIUM CHLORIDE 0.9 % IV BOLUS (SEPSIS)
500.0000 mL | INTRAVENOUS | Status: AC
  Administered 2015-03-18: 500 mL via INTRAVENOUS

## 2015-03-18 MED ORDER — IOHEXOL 240 MG/ML SOLN
25.0000 mL | Freq: Once | INTRAMUSCULAR | Status: AC | PRN
  Administered 2015-03-18: 25 mL via ORAL

## 2015-03-18 MED ORDER — LEVOFLOXACIN 500 MG PO TABS: 500.0000 mg | ORAL_TABLET | ORAL | Status: DC

## 2015-03-18 MED ORDER — ONDANSETRON HCL 4 MG/2ML IJ SOLN
4.0000 mg | Freq: Once | INTRAMUSCULAR | Status: AC
  Administered 2015-03-18: 4 mg via INTRAVENOUS

## 2015-03-18 MED ORDER — LEVOFLOXACIN 500 MG PO TABS
ORAL_TABLET | ORAL | Status: AC
  Administered 2015-03-18: 750 mg via ORAL
  Filled 2015-03-18: qty 1

## 2015-03-18 MED ORDER — ONDANSETRON HCL 4 MG/2ML IJ SOLN
INTRAMUSCULAR | Status: AC
  Filled 2015-03-18: qty 2

## 2015-03-18 MED ORDER — LEVOFLOXACIN 750 MG PO TABS
750.0000 mg | ORAL_TABLET | Freq: Once | ORAL | Status: AC
  Administered 2015-03-18: 750 mg via ORAL

## 2015-03-18 NOTE — ED Provider Notes (Signed)
  Physical Exam  BP 107/65 mmHg  Pulse 84  Temp(Src) 98.3 F (36.8 C) (Oral)  Resp 24  Ht 6\' 2"  (1.88 m)  Wt 210 lb (95.255 kg)  BMI 26.95 kg/m2  SpO2 98%  Physical Exam Patient resting comfortably at this time. Still with mild tenderness diffusely to the abdomen. 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 No change in the size of aneurysm. Possible enteritis as well. Discussed this with Dr. Lorenso Courier of vascular surgery. Says that the patient may follow-up in the office for the endoleak. Says is not an emergency since the size of the aneurysm has not changed.  ----------------------------------------- 10:52 AM on 03/18/2015 -----------------------------------------  Patient feeling improved at this point. Tolerating by mouth without vomiting. Discussed CAT scan results including the aneurysm leaking through the graft but being contained. Also patient says has had a cough lately. We will treat for pneumonia. Discussed with Dr. Holley Raring and patient will be able to go to dialysis today on the second session. His cousin is here to take him to dialysis at this time.      Orbie Pyo, MD 03/18/15 [phone]

## 2015-03-18 NOTE — ED Notes (Signed)
Pt to rm 9 via EMS from home.  EMS reports pt c/o lower abd pain, n/v (emesis green), and SOB.  EMS report lungs clear.  Pt on dialysis Tues, Thurs, Sat on left arm.  Pt NAD upon arrival.  Pt wearing 2L o2 [place], states he uses it PRN at night at home.

## 2015-03-18 NOTE — ED Provider Notes (Signed)
[facility] Emergency Department Provider Note  ____________________________________________  Time seen: Approximately 5:40 AM  I have reviewed the triage vital signs and the nursing notes.   HISTORY  Chief Complaint Emesis; Abdominal Pain; and Shortness of Breath  The patient is somnolent and a vague historian (has history of cognitive communication deficit)  HPI AYVIN LIPINSKI is a 74 y.o. male with a complicated past medical history that includes a history of small bowel obstruction, septic shock, COPD, end-stage renal disease on hemodialysis on Tuesday, Thursday, and Saturdays, and TIAs who presents with abdominal pain and multiple episodes of bilious emesis.  The patient's history is limited enough that I cannot determine if the onset of the abdominal pain is gradual or acute.  He states that he feels better at this time after some Zofran.  He remains somnolent though he awakes to stimulation and voice.   Past Medical History  Diagnosis Date  . PAF (paroxysmal atrial fibrillation)     a. new onset 12/2014 in the setting of UTI, sepsis, hypotension, and anemia; b. not on long term anticoagulation given anemia; c. family aware of stroke risk, they are ok with this; d. on amiodarone   . Chronic combined systolic and diastolic CHF (congestive heart failure)     a. echo 12/2014: EF 25-30%, anterior wall wall motion abnormalities; b. planned ischemic evaluation once patient is stable medically; c. echo 01/2015: EF 45-50%, no RWMA, mild AT, LA mildly dilated, mod pericardial effusion along LV free wall, no evidence of hemodynamic compromise  . ESRD on hemodialysis     a. Tuesday, Thursday, and Saturdays  . Anemia     a. baseline hgb ~ 8  . History of small bowel obstruction     a. 01/2015  . History of septic shock     a. 01/2015  . GERD (gastroesophageal reflux disease)   . Allergic rhinitis   . COPD (chronic obstructive pulmonary disease)   .  Hypercholesteremia   . Cognitive communication deficit   . Magnesium deficiency   . Dementia   . Chronic indwelling Foley catheter   . Iliac aneurysm   . Incontinence   . Hydronephrosis   . Overactive bladder   . Detrusor sphincter dyssynergia   . Mini stroke   . Bladder cancer   . Prostate cancer     Patient Active Problem List   Diagnosis Date Noted  . History of sepsis 02/27/2015  . Incontinence 02/27/2015  . Persistent atrial fibrillation 02/27/2015  . Preoperative cardiovascular examination 02/27/2015  . GI bleed 02/13/2015  . Ileus   . Chronic combined systolic and diastolic CHF (congestive heart failure)   . ESRD on hemodialysis   . Anemia   . History of small bowel obstruction   . Atrial fibrillation with RVR 01/21/2015  . Chronic kidney disease (CKD), stage IV (severe) 03/01/2014  . Essential (primary) hypertension 03/01/2014  . BP (high blood pressure) 03/01/2014  . 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 Absence of bladder continence 03/01/2014  . Chronic kidney disease requiring chronic dialysis 03/01/2014  . Bladder compliance low 11/11/2012  . Cystitis 11/11/2012  . Acute cystitis 10/15/2012  . AAA (abdominal aortic aneurysm) 08/27/2012  . Aneurysm artery, iliac 08/27/2012  . Chronic kidney disease (CKD), stage V 08/27/2012  . Hydronephrosis 08/27/2012  . Incomplete bladder emptying 08/27/2012  . Bladder cancer 08/27/2012  . CA of prostate 08/27/2012  . Bone metastases 08/27/2012  . Infection of urinary tract 08/27/2012    Past Surgical  History  Procedure Laterality Date  . Cystoscopy w/ ureteral stent placement    . Transurethral resection of bladder tumor with gyrus (turbt-gyrus)    . Ureteral stent placement    . Prostate surgery      Removal  . Av fistula placement      left arm    Current Outpatient Rx  Name  Route  Sig  Dispense  Refill  . albuterol (PROVENTIL HFA;VENTOLIN HFA) 108 (90 BASE) MCG/ACT inhaler   Inhalation   Inhale 2 puffs  into the lungs every 4 (four) hours as needed for wheezing or shortness of breath (Every 4 to 6 hours).         Marland Kitchen albuterol (PROVENTIL) (2.5 MG/3ML) 0.083% nebulizer solution   Nebulization   Take 2.5 mg by nebulization every 6 (six) hours as needed for wheezing or shortness of breath.         Marland Kitchen amiodarone (PACERONE) 200 MG tablet   Oral   Take 1 tablet (200 mg total) by mouth daily.   30 tablet   5   . amLODipine (NORVASC) 5 MG tablet               . atorvastatin (LIPITOR) 40 MG tablet   Oral   Take 1 tablet (40 mg total) by mouth daily.   30 tablet   1   . budesonide-formoterol (SYMBICORT) 160-4.5 MCG/ACT inhaler   Inhalation   Inhale 2 puffs into the lungs 2 (two) times daily.         . chlorpheniramine (CHLOR-TRIMETON) 4 MG tablet   Oral   Take 4 mg by mouth every 4 (four) hours as needed for allergies.         Marland Kitchen guaifenesin (ROBITUSSIN) 100 MG/5ML syrup   Oral   Take 200 mg by mouth 3 (three) times daily as needed for cough.         . loperamide (IMODIUM A-D) 2 MG tablet   Oral   Take 2 mg by mouth every 4 (four) hours as needed for diarrhea or loose stools.         . magnesium oxide (MAG-OX) 400 MG tablet   Oral   Take 400 mg by mouth at bedtime.         . multivitamin (RENA-VIT) TABS tablet   Oral   Take 1 tablet by mouth daily.         . Nutritional Supplements (FEEDING SUPPLEMENT, NEPRO CARB STEADY,) LIQD   Oral   Take 237 mLs by mouth 2 (two) times daily between meals.   30 Can   0   . pantoprazole (PROTONIX) 40 MG tablet   Oral   Take 1 tablet (40 mg total) by mouth daily.   30 tablet   0   . sevelamer carbonate (RENVELA) 800 MG tablet   Oral   Take 800 mg by mouth 3 (three) times daily with meals.          Marland Kitchen sulfamethoxazole-trimethoprim (BACTRIM DS,SEPTRA DS) 800-160 MG per tablet   Oral   Take 1 tablet by mouth daily. Patient not taking: Reported on 03/16/2015   3 tablet   0   . tiotropium (SPIRIVA) 18 MCG  inhalation capsule   Inhalation   Place 1 capsule (18 mcg total) into inhaler and inhale daily.   30 capsule   12   . Tiotropium Bromide Monohydrate 2.5 MCG/ACT AERS   Inhalation   Inhale 1 puff into the lungs daily.  Allergies Penicillins  Family History  Problem Relation Age of Onset  . Stroke Mother     Social History History  Substance Use Topics  . Smoking status: Former Smoker -- 60 years    Quit date: 01/11/2015  . Smokeless tobacco: Never Used  . Alcohol Use: No    Review of Systems Unable to obtain reliable review of systems given the patient's deficits but per EMS he has had abdominal pain and bilious emesis  ____________________________________________   PHYSICAL EXAM:  VITAL SIGNS: ED Triage Vitals  Enc Vitals Group     BP 03/18/15 0452 135/77 mmHg     Pulse Rate 03/18/15 0452 102     Resp 03/18/15 0452 16     Temp 03/18/15 0452 98.3 F (36.8 C)     Temp Source 03/18/15 0452 Oral     SpO2 03/18/15 0452 100 %     Weight 03/18/15 0452 210 lb (95.255 kg)     Height 03/18/15 0452 6\' 2"  (1.88 m)     Head Cir --      Peak Flow --      Pain Score 03/18/15 0453 3     Pain Loc --      Pain Edu? --      Excl. in [place]? --     Constitutional: Somnolent but awakens to voice and stimulation.  Has the appearance of chronic illness. Eyes: Conjunctivae are normal. PERRL. EOMI. Head: Atraumatic. Nose: No congestion/rhinnorhea. Mouth/Throat: Mucous membranes are moist.  Oropharynx non-erythematous. Neck: No stridor.   Cardiovascular: Mild tachycardia, regular rhythm. Grossly normal heart sounds.  Good peripheral circulation. Respiratory: Normal respiratory effort.  No retractions. Lungs CTAB. Gastrointestinal: Soft but moderate tenderness throughout.  Mild distention. No abdominal bruits. No CVA tenderness. Musculoskeletal: No lower extremity tenderness nor edema.  No joint effusions. Neurologic:  Normal speech and language. No gross focal  neurologic deficits are appreciated.  Skin:  Skin is warm, dry and intact. No rash noted.   ____________________________________________   LABS (all labs ordered are listed, but only abnormal results are displayed)  Labs Reviewed  CBC WITH DIFFERENTIAL/PLATELET - Abnormal; Notable for the following:    RBC 2.71 (*)    Hemoglobin 8.1 (*)    HCT 24.9 (*)    RDW 19.1 (*)    Neutro Abs 8.9 (*)    Lymphs Abs 0.6 (*)    All other components within normal limits  COMPREHENSIVE METABOLIC PANEL - Abnormal; Notable for the following:    Chloride 96 (*)    Glucose, Bld 137 (*)    BUN 45 (*)    Creatinine, Ser 7.62 (*)    Calcium 8.7 (*)    Albumin 2.9 (*)    AST 12 (*)    ALT 6 (*)    GFR calc non Af Amer 6 (*)    GFR calc Af Amer 7 (*)    All other components within normal limits  TROPONIN I - Abnormal; Notable for the following:    Troponin I 0.04 (*)    All other components within normal limits  LIPASE, BLOOD  LACTIC ACID, PLASMA  LACTIC ACID, PLASMA   ____________________________________________  EKG  ED ECG REPORT I, Mardelle Pandolfi, the attending physician, personally viewed and interpreted this ECG.   Date: 03/18/2015  EKG Time: 05:00  Rate: 98  Rhythm: normal sinus rhythm  Axis: Normal  Intervals:Short PR interval  ST&T Change: Non-specific ST segment / T-wave changes, but no evidence of acute ischemia.  ____________________________________________  RADIOLOGY  Pending abd CT scan  ____________________________________________   PROCEDURES  Procedure(s) performed: None  Critical Care performed: No ____________________________________________   INITIAL IMPRESSION / ASSESSMENT AND PLAN / ED COURSE  Pertinent labs & imaging results that were available during my care of the patient were reviewed by me and considered in my medical decision making (see chart for details).  The patient has numerous chronic medical problems at his ill-appearing at this  time.  He has a lactate of 2 and severe abdominal tenderness to palpation.  He is somnolent but awakens to stimulation.  I am concerned about possible bowel obstruction versus volvulus.  He has chronic renal failure so I will proceed with a CT scan with by mouth and IV contrast.  I am holding empiric antibiotics at this time but will treat with a small fluid bolus (dialysis patient) and anti-emetics.  Transferring ED care to Dr. Clearnce Hasten at 07:15.  ____________________________________________  FINAL CLINICAL IMPRESSION(S) / ED DIAGNOSES  Final diagnoses:  Generalized abdominal pain  Non-intractable vomiting with nausea, vomiting of unspecified type    Hinda Kehr, MD 03/18/15 [phone]

## 2015-03-18 NOTE — ED Notes (Signed)
Family at bedside, pt remains sleepy, await disposition.

## 2015-03-20 ENCOUNTER — Inpatient Hospital Stay
Admission: EM | Admit: 2015-03-20 | Discharge: 2015-03-27 | DRG: 193 | Disposition: A | Discharge status: Home / Self Care | Payer: Medicare Other | Attending: Internal Medicine | Admitting: Internal Medicine

## 2015-03-20 ENCOUNTER — Emergency Department: Payer: Medicare Other

## 2015-03-20 ENCOUNTER — Telehealth: Payer: Self-pay | Admitting: Urology

## 2015-03-20 DIAGNOSIS — R1084 Generalized abdominal pain: Secondary | ICD-10-CM | POA: Diagnosis not present

## 2015-03-20 DIAGNOSIS — Z823 Family history of stroke: Secondary | ICD-10-CM | POA: Diagnosis not present

## 2015-03-20 DIAGNOSIS — N186 End stage renal disease: Secondary | ICD-10-CM | POA: Diagnosis not present

## 2015-03-20 DIAGNOSIS — N390 Urinary tract infection, site not specified: Secondary | ICD-10-CM | POA: Diagnosis not present

## 2015-03-20 DIAGNOSIS — I953 Hypotension of hemodialysis: Secondary | ICD-10-CM | POA: Diagnosis not present

## 2015-03-20 DIAGNOSIS — Z9889 Other specified postprocedural states: Secondary | ICD-10-CM | POA: Diagnosis not present

## 2015-03-20 DIAGNOSIS — Z6826 Body mass index (BMI) 26.0-26.9, adult: Secondary | ICD-10-CM | POA: Diagnosis not present

## 2015-03-20 DIAGNOSIS — Z880 Allergy status to penicillin: Secondary | ICD-10-CM | POA: Diagnosis not present

## 2015-03-20 DIAGNOSIS — I480 Paroxysmal atrial fibrillation: Secondary | ICD-10-CM | POA: Diagnosis not present

## 2015-03-20 DIAGNOSIS — I728 Aneurysm of other specified arteries: Secondary | ICD-10-CM | POA: Diagnosis not present

## 2015-03-20 DIAGNOSIS — F17210 Nicotine dependence, cigarettes, uncomplicated: Secondary | ICD-10-CM | POA: Diagnosis not present

## 2015-03-20 DIAGNOSIS — E780 Pure hypercholesterolemia: Secondary | ICD-10-CM | POA: Diagnosis not present

## 2015-03-20 DIAGNOSIS — N139 Obstructive and reflux uropathy, unspecified: Secondary | ICD-10-CM | POA: Diagnosis not present

## 2015-03-20 DIAGNOSIS — R41841 Cognitive communication deficit: Secondary | ICD-10-CM | POA: Diagnosis not present

## 2015-03-20 DIAGNOSIS — Y95 Nosocomial condition: Secondary | ICD-10-CM | POA: Diagnosis not present

## 2015-03-20 DIAGNOSIS — E785 Hyperlipidemia, unspecified: Secondary | ICD-10-CM | POA: Diagnosis not present

## 2015-03-20 DIAGNOSIS — I120 Hypertensive chronic kidney disease with stage 5 chronic kidney disease or end stage renal disease: Secondary | ICD-10-CM | POA: Diagnosis not present

## 2015-03-20 DIAGNOSIS — Z8679 Personal history of other diseases of the circulatory system: Secondary | ICD-10-CM | POA: Diagnosis not present

## 2015-03-20 DIAGNOSIS — Z0189 Encounter for other specified special examinations: Secondary | ICD-10-CM

## 2015-03-20 DIAGNOSIS — D649 Anemia, unspecified: Secondary | ICD-10-CM | POA: Diagnosis present

## 2015-03-20 DIAGNOSIS — E440 Moderate protein-calorie malnutrition: Secondary | ICD-10-CM | POA: Diagnosis not present

## 2015-03-20 DIAGNOSIS — C61 Malignant neoplasm of prostate: Secondary | ICD-10-CM | POA: Diagnosis not present

## 2015-03-20 DIAGNOSIS — I5042 Chronic combined systolic (congestive) and diastolic (congestive) heart failure: Secondary | ICD-10-CM | POA: Diagnosis not present

## 2015-03-20 DIAGNOSIS — J189 Pneumonia, unspecified organism: Secondary | ICD-10-CM | POA: Diagnosis present

## 2015-03-20 DIAGNOSIS — Z8673 Personal history of transient ischemic attack (TIA), and cerebral infarction without residual deficits: Secondary | ICD-10-CM | POA: Diagnosis not present

## 2015-03-20 DIAGNOSIS — Z79899 Other long term (current) drug therapy: Secondary | ICD-10-CM | POA: Diagnosis not present

## 2015-03-20 DIAGNOSIS — R109 Unspecified abdominal pain: Secondary | ICD-10-CM | POA: Diagnosis present

## 2015-03-20 DIAGNOSIS — Z992 Dependence on renal dialysis: Secondary | ICD-10-CM | POA: Diagnosis not present

## 2015-03-20 DIAGNOSIS — Z7951 Long term (current) use of inhaled steroids: Secondary | ICD-10-CM | POA: Diagnosis not present

## 2015-03-20 DIAGNOSIS — K219 Gastro-esophageal reflux disease without esophagitis: Secondary | ICD-10-CM | POA: Diagnosis not present

## 2015-03-20 DIAGNOSIS — Z8551 Personal history of malignant neoplasm of bladder: Secondary | ICD-10-CM | POA: Diagnosis not present

## 2015-03-20 DIAGNOSIS — D631 Anemia in chronic kidney disease: Secondary | ICD-10-CM | POA: Diagnosis not present

## 2015-03-20 DIAGNOSIS — J449 Chronic obstructive pulmonary disease, unspecified: Secondary | ICD-10-CM | POA: Diagnosis not present

## 2015-03-20 DIAGNOSIS — N2581 Secondary hyperparathyroidism of renal origin: Secondary | ICD-10-CM | POA: Diagnosis not present

## 2015-03-20 DIAGNOSIS — F0390 Unspecified dementia without behavioral disturbance: Secondary | ICD-10-CM | POA: Diagnosis present

## 2015-03-20 DIAGNOSIS — K567 Ileus, unspecified: Secondary | ICD-10-CM | POA: Diagnosis present

## 2015-03-20 DIAGNOSIS — J159 Unspecified bacterial pneumonia: Secondary | ICD-10-CM | POA: Diagnosis present

## 2015-03-20 DIAGNOSIS — I5043 Acute on chronic combined systolic (congestive) and diastolic (congestive) heart failure: Secondary | ICD-10-CM | POA: Diagnosis not present

## 2015-03-20 DIAGNOSIS — Z8546 Personal history of malignant neoplasm of prostate: Secondary | ICD-10-CM | POA: Diagnosis not present

## 2015-03-20 HISTORY — DX: Essential (primary) hypertension: I10

## 2015-03-20 LAB — BASIC METABOLIC PANEL
ANION GAP: 16 — AB (ref 5–15)
BUN: 41 mg/dL — ABNORMAL HIGH (ref 6–20)
CO2: 29 mmol/L (ref 22–32)
CREATININE: 7.46 mg/dL — AB (ref 0.61–1.24)
Calcium: 8.6 mg/dL — ABNORMAL LOW (ref 8.9–10.3)
Chloride: 91 mmol/L — ABNORMAL LOW (ref 101–111)
GFR calc non Af Amer: 6 mL/min — ABNORMAL LOW (ref >60)
GFR, EST AFRICAN AMERICAN: 7 mL/min — AB (ref >60)
Glucose, Bld: 145 mg/dL — ABNORMAL HIGH (ref 65–99)
POTASSIUM: 3.4 mmol/L — AB (ref 3.5–5.1)
Sodium: 136 mmol/L (ref 135–145)

## 2015-03-20 LAB — CULTURE, URINE COMPREHENSIVE

## 2015-03-20 LAB — CBC
HEMATOCRIT: 23.2 % — AB (ref 40.0–52.0)
Hemoglobin: 7.5 g/dL — ABNORMAL LOW (ref 13.0–18.0)
MCH: 29.4 pg (ref 26.0–34.0)
MCHC: 32.4 g/dL (ref 32.0–36.0)
MCV: 90.8 fL (ref 80.0–100.0)
Platelets: 187 10*3/uL (ref 150–440)
RBC: 2.55 MIL/uL — AB (ref 4.40–5.90)
RDW: 18.9 % — ABNORMAL HIGH (ref 11.5–14.5)
WBC: 11.8 10*3/uL — ABNORMAL HIGH (ref 3.8–10.6)

## 2015-03-20 LAB — FERRITIN: FERRITIN: 1513 ng/mL — AB (ref 24–336)

## 2015-03-20 LAB — IRON AND TIBC
Iron: 22 ug/dL — ABNORMAL LOW (ref 45–182)
Saturation Ratios: 12 % — ABNORMAL LOW (ref 17.9–39.5)
TIBC: 182 ug/dL — AB (ref 250–450)
UIBC: 160 ug/dL

## 2015-03-20 LAB — LACTIC ACID, PLASMA
LACTIC ACID, VENOUS: 3.1 mmol/L — AB (ref 0.5–2.0)
Lactic Acid, Venous: 1.4 mmol/L (ref 0.5–2.0)

## 2015-03-20 LAB — TROPONIN I
Troponin I: 0.05 ng/mL — ABNORMAL HIGH (ref <0.031)
Troponin I: 0.03 ng/mL (ref <0.031)

## 2015-03-20 MED ORDER — ACETAMINOPHEN 325 MG PO TABS
650.0000 mg | ORAL_TABLET | Freq: Four times a day (QID) | ORAL | Status: DC | PRN
  Administered 2015-03-21: 650 mg via ORAL

## 2015-03-20 MED ORDER — VANCOMYCIN HCL 10 G IV SOLR
2000.0000 mg | INTRAVENOUS | Status: AC
  Administered 2015-03-21: 2000 mg via INTRAVENOUS
  Filled 2015-03-20: qty 2000

## 2015-03-20 MED ORDER — DEXTROSE 5 % IV SOLN
1.0000 g | INTRAVENOUS | Status: DC
  Administered 2015-03-20 – 2015-03-21 (×2): 1 g via INTRAVENOUS
  Administered 2015-03-22: 21:00:00 via INTRAVENOUS
  Administered 2015-03-23: 21:00:00 1 g via INTRAVENOUS
  Filled 2015-03-20 (×6): qty 1

## 2015-03-20 MED ORDER — LEVOFLOXACIN IN D5W 250 MG/50ML IV SOLN
250.0000 mg | INTRAVENOUS | Status: DC
  Filled 2015-03-20: qty 50

## 2015-03-20 MED ORDER — VANCOMYCIN HCL IN DEXTROSE 1-5 GM/200ML-% IV SOLN
1000.0000 mg | INTRAVENOUS | Status: DC
  Administered 2015-03-21 – 2015-03-23 (×3): 1000 mg via INTRAVENOUS
  Filled 2015-03-20 (×4): qty 200

## 2015-03-20 MED ORDER — PANTOPRAZOLE SODIUM 40 MG PO TBEC
40.0000 mg | DELAYED_RELEASE_TABLET | Freq: Every day | ORAL | Status: DC
  Administered 2015-03-21 – 2015-03-23 (×3): 40 mg via ORAL
  Filled 2015-03-20 (×3): qty 1

## 2015-03-20 MED ORDER — LEVOFLOXACIN IN D5W 750 MG/150ML IV SOLN
750.0000 mg | Freq: Once | INTRAVENOUS | Status: AC
  Administered 2015-03-20: 750 mg via INTRAVENOUS
  Filled 2015-03-20: qty 150

## 2015-03-20 MED ORDER — MAGNESIUM OXIDE 400 MG PO TABS
400.0000 mg | ORAL_TABLET | Freq: Every day | ORAL | Status: DC
  Filled 2015-03-20 (×2): qty 1

## 2015-03-20 MED ORDER — AMIODARONE HCL 200 MG PO TABS
200.0000 mg | ORAL_TABLET | Freq: Every day | ORAL | Status: DC
  Administered 2015-03-21 – 2015-03-27 (×4): 200 mg via ORAL
  Filled 2015-03-20 (×4): qty 1

## 2015-03-20 MED ORDER — SEVELAMER CARBONATE 800 MG PO TABS
800.0000 mg | ORAL_TABLET | Freq: Three times a day (TID) | ORAL | Status: DC
  Administered 2015-03-21: 09:00:00 800 mg via ORAL
  Filled 2015-03-20: qty 1

## 2015-03-20 MED ORDER — BUDESONIDE-FORMOTEROL FUMARATE 160-4.5 MCG/ACT IN AERO
2.0000 | INHALATION_SPRAY | Freq: Two times a day (BID) | RESPIRATORY_TRACT | Status: DC
  Administered 2015-03-20 – 2015-03-27 (×14): 2 via RESPIRATORY_TRACT
  Filled 2015-03-20 (×2): qty 6

## 2015-03-20 MED ORDER — LEVOFLOXACIN IN D5W 500 MG/100ML IV SOLN: 500.0000 mg | INTRAVENOUS | Status: DC

## 2015-03-20 MED ORDER — RENA-VITE PO TABS
1.0000 | ORAL_TABLET | Freq: Every day | ORAL | Status: DC
  Administered 2015-03-21 – 2015-03-22 (×2): 1 via ORAL
  Filled 2015-03-20 (×5): qty 1

## 2015-03-20 MED ORDER — ATORVASTATIN CALCIUM 20 MG PO TABS
40.0000 mg | ORAL_TABLET | Freq: Every day | ORAL | Status: DC
  Administered 2015-03-21 – 2015-03-27 (×4): 40 mg via ORAL
  Filled 2015-03-20 (×4): qty 2

## 2015-03-20 MED ORDER — ONDANSETRON HCL 4 MG/2ML IJ SOLN: 4.0000 mg | Freq: Four times a day (QID) | INTRAMUSCULAR | Status: DC | PRN

## 2015-03-20 MED ORDER — ACETAMINOPHEN 650 MG RE SUPP: 650.0000 mg | Freq: Four times a day (QID) | RECTAL | Status: DC | PRN

## 2015-03-20 MED ORDER — VANCOMYCIN HCL 10 G IV SOLR: 1250.0000 mg | Freq: Once | INTRAVENOUS | Status: DC

## 2015-03-20 MED ORDER — TIOTROPIUM BROMIDE MONOHYDRATE 18 MCG IN CAPS
18.0000 ug | ORAL_CAPSULE | Freq: Every day | RESPIRATORY_TRACT | Status: DC
  Administered 2015-03-21 – 2015-03-27 (×7): 18 ug via RESPIRATORY_TRACT
  Filled 2015-03-20 (×3): qty 5

## 2015-03-20 MED ORDER — NEPRO/CARBSTEADY PO LIQD
237.0000 mL | Freq: Two times a day (BID) | ORAL | Status: DC
  Administered 2015-03-21 – 2015-03-27 (×3): 237 mL via ORAL

## 2015-03-20 MED ORDER — AMLODIPINE BESYLATE 5 MG PO TABS
5.0000 mg | ORAL_TABLET | Freq: Every day | ORAL | Status: DC
  Administered 2015-03-21: 5 mg via ORAL
  Filled 2015-03-20 (×2): qty 1

## 2015-03-20 MED ORDER — ALBUTEROL SULFATE (2.5 MG/3ML) 0.083% IN NEBU: 2.5000 mg | INHALATION_SOLUTION | Freq: Four times a day (QID) | RESPIRATORY_TRACT | Status: DC | PRN

## 2015-03-20 MED ORDER — ONDANSETRON HCL 4 MG PO TABS: 4.0000 mg | ORAL_TABLET | Freq: Four times a day (QID) | ORAL | Status: DC | PRN

## 2015-03-20 MED ORDER — PNEUMOCOCCAL VAC POLYVALENT 25 MCG/0.5ML IJ INJ
0.5000 mL | INJECTION | INTRAMUSCULAR | Status: AC | PRN
  Administered 2015-03-27: 0.5 mL via INTRAMUSCULAR
  Filled 2015-03-20: qty 0.5

## 2015-03-20 MED ORDER — ALUM & MAG HYDROXIDE-SIMETH 200-200-20 MG/5ML PO SUSP: 30.0000 mL | Freq: Four times a day (QID) | ORAL | Status: DC | PRN

## 2015-03-20 MED ORDER — HEPARIN SODIUM (PORCINE) 5000 UNIT/ML IJ SOLN
5000.0000 [IU] | Freq: Three times a day (TID) | INTRAMUSCULAR | Status: DC
  Administered 2015-03-21 – 2015-03-23 (×6): 5000 [IU] via SUBCUTANEOUS
  Filled 2015-03-20 (×6): qty 1

## 2015-03-20 NOTE — Progress Notes (Signed)
ANTIBIOTIC CONSULT NOTE - INITIAL  Pharmacy Consult for Vancomycin, Levaquin, Ceftazidime  Indication: pneumonia  Allergies  Allergen Reactions  . Penicillins Hives, Itching and Swelling    Whelps, itching, and swelling    Patient Measurements: Height: 6\' 2"  (188 cm) Weight: 213 lb (96.616 kg) IBW/kg (Calculated) : 82.2 Adjusted Body Weight: 88 kg  Vital Signs: Temp: 98.2 F (36.8 C) (06/27 2029) Temp Source: Oral (06/27 2029) BP: 106/57 mmHg (06/27 2029) Pulse Rate: 82 (06/27 2029) Intake/Output from previous day:   Intake/Output from this shift:    Labs:  Recent Labs  03/18/15 0504 03/20/15 1542  WBC 9.9 11.8*  HGB 8.1* 7.5*  PLT 205 187  CREATININE 7.62* 7.46*   Estimated Creatinine Clearance: 10.3 mL/min (by C-G formula based on Cr of 7.46). No results for input(s): VANCOTROUGH, VANCOPEAK, VANCORANDOM, GENTTROUGH, GENTPEAK, GENTRANDOM, TOBRATROUGH, TOBRAPEAK, TOBRARND, AMIKACINPEAK, AMIKACINTROU, AMIKACIN in the last 72 hours.   Microbiology: Recent Results (from the past 720 hour(s))  CULTURE, URINE COMPREHENSIVE     Status: None   Collection Time: 02/27/15  9:30 AM  Result Value Ref Range Status   Urine Culture, Comprehensive Final report  Final   Result 1 Comment  Final    Comment: No growth in 36 - 48 hours.  Microscopic Examination     Status: Abnormal   Collection Time: 02/27/15  9:30 AM  Result Value Ref Range Status   WBC, UA 11-30 (A) 0 -  5 /hpf Final   RBC, UA >30 (A) 0 -  2 /hpf Final   Epithelial Cells (non renal) 0-10 0 - 10 /hpf Final   Bacteria, UA Few None seen/Few Final  Microscopic Examination     Status: Abnormal   Collection Time: 03/16/15  2:02 PM  Result Value Ref Range Status   WBC, UA >30 (A) 0 -  5 /hpf Final   RBC, UA 3-10 (A) 0 -  2 /hpf Final   Epithelial Cells (non renal) 0-10 0 - 10 /hpf Final   Bacteria, UA Many (A) None seen/Few Final  CULTURE, URINE COMPREHENSIVE     Status: None   Collection Time: 03/16/15  2:30  PM  Result Value Ref Range Status   Urine Culture, Comprehensive Final report  Final   Result 1 Comment  Final    Comment: Diphtheroids, not Corynebacterium urealyticum 50,000-100,000 colony forming units per mL Susceptibility not normally performed on this organism.     Medical History: Past Medical History  Diagnosis Date  . PAF (paroxysmal atrial fibrillation)     a. new onset 12/2014 in the setting of UTI, sepsis, hypotension, and anemia; b. not on long term anticoagulation given anemia; c. family aware of stroke risk, they are ok with this; d. on amiodarone   . Chronic combined systolic and diastolic CHF (congestive heart failure)     a. echo 12/2014: EF 25-30%, anterior wall wall motion abnormalities; b. planned ischemic evaluation once patient is stable medically; c. echo 01/2015: EF 45-50%, no RWMA, mild AT, LA mildly dilated, mod pericardial effusion along LV free wall, no evidence of hemodynamic compromise  . ESRD on hemodialysis     a. Tuesday, Thursday, and Saturdays  . Anemia     a. baseline hgb ~ 8  . History of small bowel obstruction     a. 01/2015  . History of septic shock     a. 01/2015  . GERD (gastroesophageal reflux disease)   . Allergic rhinitis   . COPD (chronic obstructive pulmonary  disease)   . Hypercholesteremia   . Cognitive communication deficit   . Magnesium deficiency   . Dementia   . Chronic indwelling Foley catheter   . Iliac aneurysm   . Incontinence   . Hydronephrosis   . Overactive bladder   . Detrusor sphincter dyssynergia   . Mini stroke   . Bladder cancer   . Prostate cancer   . Renal insufficiency     Medications:  Prescriptions prior to admission  Medication Sig Dispense Refill Last Dose  . albuterol (PROVENTIL) (2.5 MG/3ML) 0.083% nebulizer solution Take 2.5 mg by nebulization every 6 (six) hours as needed for wheezing or shortness of breath.   03/20/2015 at Unknown time  . amiodarone (PACERONE) 200 MG tablet Take 1 tablet (200 mg  total) by mouth daily. 30 tablet 5 03/20/2015 at Unknown time  . amLODipine (NORVASC) 5 MG tablet Take 5 mg by mouth daily.    03/20/2015 at Unknown time  . atorvastatin (LIPITOR) 40 MG tablet Take 1 tablet (40 mg total) by mouth daily. 30 tablet 1 03/20/2015 at Unknown time  . budesonide-formoterol (SYMBICORT) 160-4.5 MCG/ACT inhaler Inhale 2 puffs into the lungs 2 (two) times daily.   03/20/2015 at Unknown time  . levofloxacin (LEVAQUIN) 500 MG tablet Take 1 tablet (500 mg total) by mouth every other day. 2 tablet 0 03/20/2015 at Unknown time  . magnesium oxide (MAG-OX) 400 MG tablet Take 400 mg by mouth at bedtime.   03/19/2015 at Unknown time  . multivitamin (RENA-VIT) TABS tablet Take 1 tablet by mouth daily.   03/20/2015 at Unknown time  . pantoprazole (PROTONIX) 40 MG tablet Take 1 tablet (40 mg total) by mouth daily. 30 tablet 0 03/20/2015 at Unknown time  . sevelamer carbonate (RENVELA) 800 MG tablet Take 800 mg by mouth 3 (three) times daily with meals.    03/20/2015 at Unknown time  . Tiotropium Bromide Monohydrate 2.5 MCG/ACT AERS Inhale 1 puff into the lungs daily.   03/20/2015 at Unknown time  . albuterol (PROVENTIL HFA;VENTOLIN HFA) 108 (90 BASE) MCG/ACT inhaler Inhale 2 puffs into the lungs every 4 (four) hours as needed for wheezing or shortness of breath (Every 4 to 6 hours).   Completed Course at Unknown time  . chlorpheniramine (CHLOR-TRIMETON) 4 MG tablet Take 4 mg by mouth every 4 (four) hours as needed for allergies.   Completed Course at Unknown time  . guaifenesin (ROBITUSSIN) 100 MG/5ML syrup Take 200 mg by mouth 3 (three) times daily as needed for cough.   unknown at unknown  . loperamide (IMODIUM A-D) 2 MG tablet Take 2 mg by mouth every 4 (four) hours as needed for diarrhea or loose stools.   unknown at unknown  . Nutritional Supplements (FEEDING SUPPLEMENT, NEPRO CARB STEADY,) LIQD Take 237 mLs by mouth 2 (two) times daily between meals. 30 Can 0 unknown at unknown  .  sulfamethoxazole-trimethoprim (BACTRIM DS,SEPTRA DS) 800-160 MG per tablet Take 1 tablet by mouth daily. (Patient not taking: Reported on 03/16/2015) 3 tablet 0 Completed Course at Unknown time  . tiotropium (SPIRIVA) 18 MCG inhalation capsule Place 1 capsule (18 mcg total) into inhaler and inhale daily. 30 capsule 12 unknown at unknown   Assessment: Pt with ESRD on HD every Tues-Thurs-Sat HCAP  Goal of Therapy:  Resolution of infection. Vancomycin trough 15 - 25 mcg/mL.   Plan:  Expected duration 7 days with resolution of temperature and/or normalization of WBC  Vancomycin 2 gm IV X 1 ordered for 6/27.  Vancomycin 1 gm IV ordered to be given during last hour of each HD session. Will draw 1st trough 30 minutes before 3rd HD session scheduled for 7/2.  Levaquin 750 mg IV X 1 given on 6/27. Levaquin 500 mg IV Q48H ordered to start 6/29.  Ceftazidime 1 gm IV Q24H ordered (to be given after HD session on dialysis days).   Pamela Maddy D 03/20/2015,8:48 PM

## 2015-03-20 NOTE — ED Notes (Signed)
No apparent work in breathing noted, no audible wheezing noted. Patient able to speak in complete sentences. Patient states he feels tired, laying comfortably in bed. NAD noted at this time.

## 2015-03-20 NOTE — ED Notes (Signed)
Patient brought in via EMS from home for shortness of breath.

## 2015-03-20 NOTE — H&P (Signed)
[facility] [place] NAME: [name]    MR#:  [id number]  DATE OF BIRTH:  [birth year]  DATE OF ADMISSION:  03/20/2015  PRIMARY CARE PHYSICIAN: Dion Body, MD   REQUESTING/REFERRING PHYSICIAN: Dr. Archie Balboa  CHIEF COMPLAINT:   Chief Complaint  Patient presents with  . Shortness of Breath    HISTORY OF PRESENT ILLNESS:  [name]  is a 74 y.o. male with a known history of end-stage renal disease on hemodialysis, congestive heart failure, paroxysmal atrial fibrillation, anemia. He was recently diagnosed with pneumonia and started on Levaquin. He is coming back to the ER again with not feeling well. He's been short of breath he's been coughing up clear phlegm. He's been tired and very weak and his stomach has been sore. He did have some nausea vomiting on Saturday. Hospitalist services were contacted for further evaluation for failed outpatient treatment of pneumonia.  PAST MEDICAL HISTORY:   Past Medical History  Diagnosis Date  . PAF (paroxysmal atrial fibrillation)     a. new onset 12/2014 in the setting of UTI, sepsis, hypotension, and anemia; b. not on long term anticoagulation given anemia; c. family aware of stroke risk, they are ok with this; d. on amiodarone   . Chronic combined systolic and diastolic CHF (congestive heart failure)     a. echo 12/2014: EF 25-30%, anterior wall wall motion abnormalities; b. planned ischemic evaluation once patient is stable medically; c. echo 01/2015: EF 45-50%, no RWMA, mild AT, LA mildly dilated, mod pericardial effusion along LV free wall, no evidence of hemodynamic compromise  . ESRD on hemodialysis     a. Tuesday, Thursday, and Saturdays  . Anemia     a. baseline hgb ~ 8  . History of small bowel obstruction     a. 01/2015  . History of septic shock     a. 01/2015  . GERD (gastroesophageal reflux disease)   . Allergic rhinitis   . COPD (chronic obstructive pulmonary  disease)   . Hypercholesteremia   . Cognitive communication deficit   . Magnesium deficiency   . Dementia   . Chronic indwelling Foley catheter   . Iliac aneurysm   . Incontinence   . Hydronephrosis   . Overactive bladder   . Detrusor sphincter dyssynergia   . Mini stroke   . Bladder cancer   . Prostate cancer   . Renal insufficiency     PAST SURGICAL HISTORY:   Past Surgical History  Procedure Laterality Date  . Cystoscopy w/ ureteral stent placement    . Transurethral resection of bladder tumor with gyrus (turbt-gyrus)    . Ureteral stent placement    . Prostate surgery      Removal  . Av fistula placement      left arm    SOCIAL HISTORY:   History  Substance Use Topics  . Smoking status: Light Tobacco Smoker -- 0.25 packs/day for 56 years    Last Attempt to Quit: 01/11/2015  . Smokeless tobacco: Never Used  . Alcohol Use: No    FAMILY HISTORY:   Family History  Problem Relation Age of Onset  . Stroke Mother     DRUG ALLERGIES:   Allergies  Allergen Reactions  . Penicillins Hives, Itching and Swelling    Whelps, itching, and swelling    REVIEW OF SYSTEMS:  CONSTITUTIONAL: No fever, positive for fatigue and weakness.  EYES: No blurred or double vision.  EARS, NOSE, AND THROAT:  No tinnitus or ear pain. No sore throat. Occasional dysphagia to solids. Positive for hoarse voice RESPIRATORY: Positive for cough with clear phlegm, positive for shortness of breath, no wheezing or hemoptysis.  CARDIOVASCULAR: No chest pain, some edema.  GASTROINTESTINAL: Positive for nausea and vomiting on Saturday, no diarrhea. Positive for epigastric abdominal pain. No blood in bowel movements GENITOURINARY: No dysuria, hematuria.  ENDOCRINE: No polyuria, nocturia,  HEMATOLOGY: History of anemia, no easy bruising or bleeding SKIN: No rash or lesion. MUSCULOSKELETAL: No joint pain or arthritis.   NEUROLOGIC: No tingling, numbness, weakness.  PSYCHIATRY: No anxiety or  depression.   MEDICATIONS AT HOME:   Prior to Admission medications   Medication Sig Start Date End Date Taking? Authorizing Provider  albuterol (PROVENTIL) (2.5 MG/3ML) 0.083% nebulizer solution Take 2.5 mg by nebulization every 6 (six) hours as needed for wheezing or shortness of breath.   Yes Historical Provider, MD  amiodarone (PACERONE) 200 MG tablet Take 1 tablet (200 mg total) by mouth daily. 02/27/15  Yes Wellington Hampshire, MD  amLODipine (NORVASC) 5 MG tablet Take 5 mg by mouth daily.  02/28/15  Yes Historical Provider, MD  atorvastatin (LIPITOR) 40 MG tablet Take 1 tablet (40 mg total) by mouth daily. 02/03/15  Yes Fritzi Mandes, MD  budesonide-formoterol (SYMBICORT) 160-4.5 MCG/ACT inhaler Inhale 2 puffs into the lungs 2 (two) times daily.   Yes Historical Provider, MD  levofloxacin (LEVAQUIN) 500 MG tablet Take 1 tablet (500 mg total) by mouth every other day. 03/20/15 03/30/15 Yes Orbie Pyo, MD  magnesium oxide (MAG-OX) 400 MG tablet Take 400 mg by mouth at bedtime.   Yes Historical Provider, MD  multivitamin (RENA-VIT) TABS tablet Take 1 tablet by mouth daily.   Yes Historical Provider, MD  pantoprazole (PROTONIX) 40 MG tablet Take 1 tablet (40 mg total) by mouth daily. 02/03/15  Yes Fritzi Mandes, MD  sevelamer carbonate (RENVELA) 800 MG tablet Take 800 mg by mouth 3 (three) times daily with meals.    Yes Historical Provider, MD  Tiotropium Bromide Monohydrate 2.5 MCG/ACT AERS Inhale 1 puff into the lungs daily.   Yes Historical Provider, MD  albuterol (PROVENTIL HFA;VENTOLIN HFA) 108 (90 BASE) MCG/ACT inhaler Inhale 2 puffs into the lungs every 4 (four) hours as needed for wheezing or shortness of breath (Every 4 to 6 hours).    Historical Provider, MD  chlorpheniramine (CHLOR-TRIMETON) 4 MG tablet Take 4 mg by mouth every 4 (four) hours as needed for allergies.    Historical Provider, MD  guaifenesin (ROBITUSSIN) 100 MG/5ML syrup Take 200 mg by mouth 3 (three) times daily as needed  for cough.    Historical Provider, MD  loperamide (IMODIUM A-D) 2 MG tablet Take 2 mg by mouth every 4 (four) hours as needed for diarrhea or loose stools.    Historical Provider, MD  Nutritional Supplements (FEEDING SUPPLEMENT, NEPRO CARB STEADY,) LIQD Take 237 mLs by mouth 2 (two) times daily between meals. 02/03/15   Fritzi Mandes, MD  sulfamethoxazole-trimethoprim (BACTRIM DS,SEPTRA DS) 800-160 MG per tablet Take 1 tablet by mouth daily. Patient not taking: Reported on 03/16/2015 03/09/15   Hollice Espy, MD  tiotropium [facility]) 18 MCG inhalation capsule Place 1 capsule (18 mcg total) into inhaler and inhale daily. 02/17/15   Dustin Flock, MD      VITAL SIGNS:  Blood pressure 117/65, pulse 87, temperature 98 F (36.7 C), temperature source Oral, resp. rate 22, height 6\' 2"  (1.88 m), weight 96.616 kg (213 lb), SpO2 97 %.  PHYSICAL EXAMINATION:  GENERAL:  74 y.o.-year-old patient lying in the bed with no acute distress.  EYES: Pupils equal, round, reactive to light and accommodation. No scleral icterus. Extraocular muscles intact.  HEENT: Head atraumatic, normocephalic. Oropharynx and nasopharynx clear.  NECK:  Supple, no jugular venous distention. No thyroid enlargement, no tenderness.  LUNGS: Decreased breath sounds bilaterally, no wheezing, positive for rales at the bases, no rhonchi or crepitation. No use of accessory muscles of respiration.  CARDIOVASCULAR: S1, S2 normal. No murmurs, rubs, or gallops.  ABDOMEN: Soft, epigastric tenderness, nondistended. Bowel sounds present. No organomegaly or mass.  EXTREMITIES: 2+ edema, no cyanosis, or clubbing.  NEUROLOGIC: Cranial nerves II through XII are intact. Muscle strength 5/5 in all extremities. Sensation intact. Gait not checked.  PSYCHIATRIC: The patient is alert and oriented x 3.  SKIN: No rash, lesion, or ulcer.   LABORATORY PANEL:   CBC  Recent Labs Lab 03/20/15 1542  WBC 11.8*  HGB 7.5*  HCT 23.2*  PLT 187   Chemistries    Recent Labs Lab 03/18/15 0504 03/20/15 1542  NA 136 136  K 3.8 3.4*  CL 96* 91*  CO2 29 29  GLUCOSE 137* 145*  BUN 45* 41*  CREATININE 7.62* 7.46*  CALCIUM 8.7* 8.6*  AST 12*  --   ALT 6*  --   ALKPHOS 74  --   BILITOT 0.8  --     Cardiac Enzymes  Recent Labs Lab 03/20/15 1542  TROPONINI 0.05*    RADIOLOGY:  Dg Chest Port 1 View  03/20/2015   CLINICAL DATA:  Short of breath.  Chest pain.  EXAM: PORTABLE CHEST - 1 VIEW  COMPARISON:  03/18/2015  FINDINGS: Cardiomegaly again seen. There is venous hypertension with mild interstitial edema. Areas of atelectasis in the lower lobes are again noted. The aorta is ectatic and unfolded.  IMPRESSION: Similar appearance of likely congestive heart failure. Cardiomegaly with mild interstitial edema. Atelectasis at the bases.   Electronically Signed   By: Nelson Chimes M.D.   On: 03/20/2015 16:22    EKG:   Normal sinus rhythm 87 bpm flipped T waves laterally.  IMPRESSION AND PLAN:   1. Pneumonia left lung on previous chest x-ray couple days ago. Failed outpatient treatment. I will get more aggressive with antibiotics vancomycin and Zosyn and Levaquin. I will get a sputum culture. 2. Fluid overload seen on this chest x-ray. Patient with a history of combined systolic and diastolic congestive heart failure. I will give 1 dose of Lasix today. Patient is due for dialysis tomorrow. 3. Borderline elevated troponin likely demand ischemia and/or false positive secondary to end-stage renal disease. 4. End-stage renal disease- patient does dialysis at [facility] on [address] on Tuesday, Thursday and Saturday. We'll keep on usual schedule. 5. Anemia of chronic disease. I will check iron studies today and I will guaiac stools. Could end up needing a blood transfusion during this hospitalization. Nephrology can consider Procrit with dialysis. 6. Epigastric abdominal pain- continue PPI, when necessary Maalox 7. Tobacco abuse smoking cessation  counseling done 3 minutes by me. 8. History of paroxysmal atrial fibrillation- patient currently in normal sinus rhythm. 9. History of prostate and bladder cancer.  All the records are reviewed and case discussed with ED provider. Management plans discussed with the patient, family and they are in agreement.  CODE STATUS: Full code  TOTAL TIME TAKING CARE OF THIS PATIENT: 55 minutes.    Loletha Grayer M.D on 03/20/2015 at 6:42 PM  Between  7am to 6pm - Pager - [phone]  After 6pm call admission pager [facility]  Office  [phone]  CC: Primary care physician; Dion Body, MD

## 2015-03-20 NOTE — ED Provider Notes (Signed)
[facility] Emergency Department Provider Note    ____________________________________________  Time seen: 1550  I have reviewed the triage vital signs and the nursing notes.   HISTORY  Chief Complaint Shortness of Breath   History limited by: Limited by poor historian   HPI [name] is a 74 y.o. male comes into the emergency department today via EMS because of concerns for shortness of breath. Patientstates that he was told to present to the emergency department by his doctor. He states he has been having shortness of breath for a couple of weeks. This is accompanied by a cough productive of clear phlegm. He denies any chest pain associated with this. Denies any fever. In addition he is complaining of diffuse abdominal pain. Again it appears this is been going on for weeks. He was evaluated in the emergency department 2 days ago and states that he doesn't really feel any worse today than he did couple of days ago. He denies that the pain is changed.     Past Medical History  Diagnosis Date  . PAF (paroxysmal atrial fibrillation)     a. new onset 12/2014 in the setting of UTI, sepsis, hypotension, and anemia; b. not on long term anticoagulation given anemia; c. family aware of stroke risk, they are ok with this; d. on amiodarone   . Chronic combined systolic and diastolic CHF (congestive heart failure)     a. echo 12/2014: EF 25-30%, anterior wall wall motion abnormalities; b. planned ischemic evaluation once patient is stable medically; c. echo 01/2015: EF 45-50%, no RWMA, mild AT, LA mildly dilated, mod pericardial effusion along LV free wall, no evidence of hemodynamic compromise  . ESRD on hemodialysis     a. Tuesday, Thursday, and Saturdays  . Anemia     a. baseline hgb ~ 8  . History of small bowel obstruction     a. 01/2015  . History of septic shock     a. 01/2015  . GERD (gastroesophageal reflux disease)   . Allergic rhinitis   . COPD  (chronic obstructive pulmonary disease)   . Hypercholesteremia   . Cognitive communication deficit   . Magnesium deficiency   . Dementia   . Chronic indwelling Foley catheter   . Iliac aneurysm   . Incontinence   . Hydronephrosis   . Overactive bladder   . Detrusor sphincter dyssynergia   . Mini stroke   . Bladder cancer   . Prostate cancer   . Renal insufficiency     Patient Active Problem List   Diagnosis Date Noted  . History of sepsis 02/27/2015  . Incontinence 02/27/2015  . Persistent atrial fibrillation 02/27/2015  . Preoperative cardiovascular examination 02/27/2015  . GI bleed 02/13/2015  . Ileus   . Chronic combined systolic and diastolic CHF (congestive heart failure)   . ESRD on hemodialysis   . Anemia   . History of small bowel obstruction   . Atrial fibrillation with RVR 01/21/2015  . Chronic kidney disease (CKD), stage IV (severe) 03/01/2014  . Essential (primary) hypertension 03/01/2014  . BP (high blood pressure) 03/01/2014  . 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 Absence of bladder continence 03/01/2014  . Chronic kidney disease requiring chronic dialysis 03/01/2014  . Bladder compliance low 11/11/2012  . Cystitis 11/11/2012  . Acute cystitis 10/15/2012  . AAA (abdominal aortic aneurysm) 08/27/2012  . Aneurysm artery, iliac 08/27/2012  . Chronic kidney disease (CKD), stage V 08/27/2012  . Hydronephrosis 08/27/2012  . Incomplete bladder emptying  08/27/2012  . Bladder cancer 08/27/2012  . CA of prostate 08/27/2012  . Bone metastases 08/27/2012  . Infection of urinary tract 08/27/2012    Past Surgical History  Procedure Laterality Date  . Cystoscopy w/ ureteral stent placement    . Transurethral resection of bladder tumor with gyrus (turbt-gyrus)    . Ureteral stent placement    . Prostate surgery      Removal  . Av fistula placement      left arm    Current Outpatient Rx  Name  Route  Sig  Dispense  Refill  . albuterol (PROVENTIL HFA;VENTOLIN  HFA) 108 (90 BASE) MCG/ACT inhaler   Inhalation   Inhale 2 puffs into the lungs every 4 (four) hours as needed for wheezing or shortness of breath (Every 4 to 6 hours).         Marland Kitchen albuterol (PROVENTIL) (2.5 MG/3ML) 0.083% nebulizer solution   Nebulization   Take 2.5 mg by nebulization every 6 (six) hours as needed for wheezing or shortness of breath.         Marland Kitchen amiodarone (PACERONE) 200 MG tablet   Oral   Take 1 tablet (200 mg total) by mouth daily.   30 tablet   5   . amLODipine (NORVASC) 5 MG tablet               . atorvastatin (LIPITOR) 40 MG tablet   Oral   Take 1 tablet (40 mg total) by mouth daily.   30 tablet   1   . budesonide-formoterol (SYMBICORT) 160-4.5 MCG/ACT inhaler   Inhalation   Inhale 2 puffs into the lungs 2 (two) times daily.         . chlorpheniramine (CHLOR-TRIMETON) 4 MG tablet   Oral   Take 4 mg by mouth every 4 (four) hours as needed for allergies.         Marland Kitchen guaifenesin (ROBITUSSIN) 100 MG/5ML syrup   Oral   Take 200 mg by mouth 3 (three) times daily as needed for cough.         Marland Kitchen levofloxacin (LEVAQUIN) 500 MG tablet   Oral   Take 1 tablet (500 mg total) by mouth every other day.   2 tablet   0   . loperamide (IMODIUM A-D) 2 MG tablet   Oral   Take 2 mg by mouth every 4 (four) hours as needed for diarrhea or loose stools.         . magnesium oxide (MAG-OX) 400 MG tablet   Oral   Take 400 mg by mouth at bedtime.         . multivitamin (RENA-VIT) TABS tablet   Oral   Take 1 tablet by mouth daily.         . Nutritional Supplements (FEEDING SUPPLEMENT, NEPRO CARB STEADY,) LIQD   Oral   Take 237 mLs by mouth 2 (two) times daily between meals.   30 Can   0   . pantoprazole (PROTONIX) 40 MG tablet   Oral   Take 1 tablet (40 mg total) by mouth daily.   30 tablet   0   . sevelamer carbonate (RENVELA) 800 MG tablet   Oral   Take 800 mg by mouth 3 (three) times daily with meals.          Marland Kitchen  sulfamethoxazole-trimethoprim (BACTRIM DS,SEPTRA DS) 800-160 MG per tablet   Oral   Take 1 tablet by mouth daily. Patient not taking: Reported on 03/16/2015   3  tablet   0   . tiotropium (SPIRIVA) 18 MCG inhalation capsule   Inhalation   Place 1 capsule (18 mcg total) into inhaler and inhale daily.   30 capsule   12   . Tiotropium Bromide Monohydrate 2.5 MCG/ACT AERS   Inhalation   Inhale 1 puff into the lungs daily.           Allergies Penicillins  Family History  Problem Relation Age of Onset  . Stroke Mother     Social History History  Substance Use Topics  . Smoking status: Former Smoker -- 18 years    Quit date: 01/11/2015  . Smokeless tobacco: Never Used  . Alcohol Use: No    Review of Systems  Constitutional: Negative for fever. Cardiovascular: Negative for chest pain. Respiratory: Positive for shortness of breath. Gastrointestinal: Positive for abdominal pain, negative for vomiting and diarrhea Genitourinary: Negative for dysuria. Musculoskeletal: Negative for back pain. Skin: Negative for rash. Neurological: Negative for headaches, focal weakness or numbness.   10-point ROS otherwise negative.  ____________________________________________   PHYSICAL EXAM:  VITAL SIGNS: ED Triage Vitals  Enc Vitals Group     BP 03/20/15 1533 99/57 mmHg     Pulse --      Resp --      Temp 03/20/15 1533 98 F (36.7 C)     Temp Source 03/20/15 1533 Oral     SpO2 03/20/15 1533 100 %     Weight 03/20/15 1533 213 lb (96.616 kg)     Height 03/20/15 1533 6\' 2"  (1.88 m)     Head Cir --      Peak Flow --      Pain Score 03/20/15 1534 6   Constitutional: Alert and oriented. Appears in slight respiratory distress Eyes: Conjunctivae are normal. PERRL. Normal extraocular movements. ENT   Head: Normocephalic and atraumatic.   Nose: No congestion/rhinnorhea.   Mouth/Throat: Mucous membranes are moist.   Neck: No  stridor. Hematological/Lymphatic/Immunilogical: No cervical lymphadenopathy. Cardiovascular: Normal rate, regular rhythm.  No murmurs, rubs, or gallops. Respiratory: Slight respiratory distress, bilateral mild crackles in bases. Gastrointestinal: Moderately distended, tympanitic. Genitourinary: Deferred Musculoskeletal: Normal range of motion in all extremities. No joint effusions.  No lower extremity tenderness nor edema. Neurologic:  Normal speech and language. No gross focal neurologic deficits are appreciated. Speech is normal.  Skin:  Skin is warm, dry and intact. No rash noted. Psychiatric: Mood and affect are normal. Speech and behavior are normal. Patient exhibits appropriate insight and judgment.  ____________________________________________    LABS (pertinent positives/negatives)  Labs Reviewed  BASIC METABOLIC PANEL - Abnormal; Notable for the following:    Potassium 3.4 (*)    Chloride 91 (*)    Glucose, Bld 145 (*)    BUN 41 (*)    Creatinine, Ser 7.46 (*)    Calcium 8.6 (*)    GFR calc non Af Amer 6 (*)    GFR calc Af Amer 7 (*)    Anion gap 16 (*)    All other components within normal limits  CBC - Abnormal; Notable for the following:    WBC 11.8 (*)    RBC 2.55 (*)    Hemoglobin 7.5 (*)    HCT 23.2 (*)    RDW 18.9 (*)    All other components within normal limits  TROPONIN I - Abnormal; Notable for the following:    Troponin I 0.05 (*)    All other components within normal limits  LACTIC ACID, PLASMA -  Abnormal; Notable for the following:    Lactic Acid, Venous 3.1 (*)    All other components within normal limits  FERRITIN - Abnormal; Notable for the following:    Ferritin 1513 (*)    All other components within normal limits  IRON AND TIBC - Abnormal; Notable for the following:    Iron 22 (*)    TIBC 182 (*)    Saturation Ratios 12 (*)    All other components within normal limits  CULTURE, BLOOD (ROUTINE X 2)  CULTURE, BLOOD (ROUTINE X 2)  CULTURE,  EXPECTORATED SPUTUM-ASSESSMENT  MRSA PCR SCREENING  BASIC METABOLIC PANEL  CBC  TROPONIN I  TROPONIN I  TROPONIN I  LACTIC ACID, PLASMA     ____________________________________________   EKG  I, Nance Pear, attending physician, personally viewed and interpreted this EKG  EKG Time: 1552 Rate: 84 Rhythm: Normal sinus rhythm Axis: Normal Intervals: qtc 427 QRS: Narrow ST changes: No ST elevation    ____________________________________________    RADIOLOGY  Chest x-ray IMPRESSION: Similar appearance of likely congestive heart failure. Cardiomegaly with mild interstitial edema. Atelectasis at the bases. ____________________________________________   PROCEDURES  Procedure(s) performed: None  Critical Care performed: No  ____________________________________________   INITIAL IMPRESSION / ASSESSMENT AND PLAN / ED COURSE  Pertinent labs & imaging results that were available during my care of the patient were reviewed by me and considered in my medical decision making (see chart for details).  Patient presents to the emergency department with continued shortness of breath. Was seen 2 days earlier and started him on outpatient Levaquin. Lactic acid and white blood cell both elevated here today. At this point I am concerned for worsening pneumonia that has not responded to outpatient treatment. Will get blood cultures and start broad antibiotics.  ____________________________________________   FINAL CLINICAL IMPRESSION(S) / ED DIAGNOSES  Final diagnoses:  Healthcare-associated pneumonia     Nance Pear, MD 03/20/15 2318

## 2015-03-20 NOTE — Telephone Encounter (Signed)
Spoke with Lelan Pons our [place] tech and the sensitivities were added on and a add on fax was sent for confirmation.  Per the lab this can take an extra 7-10days to get results back. Dr Erlene Quan was notified of this and she states we need to wait on results to treat and cancel cysto stent removal and call back to reschedule when we have results. I spoke with patient's cousin [name] and informed her of these results. She states that patient is currently in the ER at [facility] due to Pneumonia and they will probably be admitting him.

## 2015-03-20 NOTE — ED Notes (Signed)
Dr. Archie Balboa notified of lactic acid of 3.1.  No new orders at this time.

## 2015-03-20 NOTE — Telephone Encounter (Signed)
-----   Message from Hollice Espy, MD sent at 03/20/2015  7:40 AM EDT ----- Please call [facility] and have then run sensitivities on the organism anyway.    Hollice Espy, MD

## 2015-03-21 ENCOUNTER — Encounter: Payer: Self-pay | Admitting: Radiology

## 2015-03-21 ENCOUNTER — Inpatient Hospital Stay: Payer: Medicare Other

## 2015-03-21 LAB — CBC
HCT: 21.1 % — ABNORMAL LOW (ref 40.0–52.0)
Hemoglobin: 6.9 g/dL — ABNORMAL LOW (ref 13.0–18.0)
MCH: 29.8 pg (ref 26.0–34.0)
MCHC: 33 g/dL (ref 32.0–36.0)
MCV: 90.3 fL (ref 80.0–100.0)
Platelets: 153 10*3/uL (ref 150–440)
RBC: 2.33 MIL/uL — ABNORMAL LOW (ref 4.40–5.90)
RDW: 18.5 % — ABNORMAL HIGH (ref 11.5–14.5)
WBC: 8.9 10*3/uL (ref 3.8–10.6)

## 2015-03-21 LAB — EXPECTORATED SPUTUM ASSESSMENT W REFEX TO RESP CULTURE: SPECIAL REQUESTS: NORMAL

## 2015-03-21 LAB — BASIC METABOLIC PANEL
ANION GAP: 12 (ref 5–15)
BUN: 45 mg/dL — AB (ref 6–20)
CHLORIDE: 95 mmol/L — AB (ref 101–111)
CO2: 29 mmol/L (ref 22–32)
Calcium: 8.2 mg/dL — ABNORMAL LOW (ref 8.9–10.3)
Creatinine, Ser: 7.79 mg/dL — ABNORMAL HIGH (ref 0.61–1.24)
GFR, EST AFRICAN AMERICAN: 7 mL/min — AB (ref >60)
GFR, EST NON AFRICAN AMERICAN: 6 mL/min — AB (ref >60)
Glucose, Bld: 113 mg/dL — ABNORMAL HIGH (ref 65–99)
Potassium: 3.4 mmol/L — ABNORMAL LOW (ref 3.5–5.1)
Sodium: 136 mmol/L (ref 135–145)

## 2015-03-21 LAB — URINALYSIS COMPLETE WITH MICROSCOPIC (ARMC ONLY)
Bilirubin Urine: NEGATIVE
Glucose, UA: NEGATIVE mg/dL
KETONES UR: NEGATIVE mg/dL
NITRITE: NEGATIVE
PH: 7 (ref 5.0–8.0)
RBC / HPF: NONE SEEN RBC/hpf (ref 0–5)
Specific Gravity, Urine: 1.012 (ref 1.005–1.030)
Squamous Epithelial / LPF: NONE SEEN

## 2015-03-21 LAB — PREPARE RBC (CROSSMATCH)

## 2015-03-21 LAB — TROPONIN I
TROPONIN I: 0.11 ng/mL — AB (ref <0.031)
Troponin I: 0.04 ng/mL — ABNORMAL HIGH (ref <0.031)

## 2015-03-21 LAB — MRSA PCR SCREENING: MRSA by PCR: POSITIVE — AB

## 2015-03-21 MED ORDER — CHLORHEXIDINE GLUCONATE CLOTH 2 % EX PADS
6.0000 | MEDICATED_PAD | Freq: Every day | CUTANEOUS | Status: AC
  Administered 2015-03-21 – 2015-03-24 (×3): 6 via TOPICAL

## 2015-03-21 MED ORDER — IOHEXOL 350 MG/ML SOLN
100.0000 mL | Freq: Once | INTRAVENOUS | Status: AC | PRN
  Administered 2015-03-21: 100 mL via INTRAVENOUS

## 2015-03-21 MED ORDER — SODIUM CHLORIDE 0.9 % IV SOLN: Freq: Once | INTRAVENOUS | Status: DC

## 2015-03-21 MED ORDER — MUPIROCIN 2 % EX OINT
1.0000 "application " | TOPICAL_OINTMENT | Freq: Two times a day (BID) | CUTANEOUS | Status: DC
  Administered 2015-03-21 – 2015-03-25 (×9): 1 via NASAL
  Filled 2015-03-21 (×2): qty 22

## 2015-03-21 NOTE — Progress Notes (Signed)
Pt in HD, received report from [facility] of pts low BP and fevers, notified Dr Candiss Norse of report received from HD Freida Busman, per Dr Candiss Norse ok to transfer pt to ccu with an order from hospitalist, Dr Manuella Ghazi hospitalist made aware of pts status, new order to transfer pt to ccu

## 2015-03-21 NOTE — Plan of Care (Addendum)
Problem: Discharge Progression Outcomes Goal: Other Discharge Outcomes/Goals Outcome: Progressing Plan of care progress: Pneumonia: -Continues IV ABTs -monitor temperature -monitor labs, WBC -HD today, HGB 6.9 blood tx during HD -pt with increased SOB and labored breathing this morning MD aware, CT ordered, Bipap placed, resting very comfortably with Bipap in place before leaving for HD -in and out cath x1 per orders, tolerates well -pt voices any needs

## 2015-03-21 NOTE — Progress Notes (Signed)
Dr Candiss Norse made aware that pt urine looked thick green/yellow, new order send for UA and culture

## 2015-03-21 NOTE — Progress Notes (Signed)
[name] from pts emergency contacts made aware of pts current status, updated on daily events, made aware that pt is being transferred to ICU bed 12

## 2015-03-21 NOTE — Clinical Social Work Note (Signed)
Clinical Social Worker was consulted for SNF placement CSW discussed pt with RN, RNCM, and Agricultural consultant during progression. Per RT, pt is on BiPAP. PT eval pending. Pt will be going to an abdominal CT. Will hold assessment. However, FL2 will be placed on chart for MD signature in anticipation for SNF placement. CSW will continue to follow.   Darden Dates, MSW, LCSW Clinical Social Worker  [phone]

## 2015-03-21 NOTE — Progress Notes (Signed)
Patient c/o of hard time breathing, wears oxygen at home at night. 1 L oxygen [place] placed on patient. MD notified, oxygen order has been placed.

## 2015-03-21 NOTE — Care Management Note (Signed)
Patient is active at [facility]  TTS schedule.  Will send hospital records to center at discharge. Iran Sizer  [phone]

## 2015-03-21 NOTE — Progress Notes (Signed)
May give sub Q heparin per orders per Dr Candiss Norse

## 2015-03-21 NOTE — Progress Notes (Signed)
PT Contraindication Note  Patient Details Name: [name] MRN: [id number] DOB: 1941-07-23   Cancelled Treatment:    Reason Eval/Treat Not Completed: Medical issues which prohibited therapy. Chart reviewed and RN consulted. Pt RN pt is not doing well at the moment and has been placed on Bipap. Per RN pt not currently appropriate for PT. Pt is pending abdominal CT followed by HD and blood transfusion. Will monitor pt and attempt to see when appropriate. CSW advised.   Lyndel Safe Huprich PT, DPT   Huprich,Jason 03/21/2015, 10:53 AM

## 2015-03-21 NOTE — Progress Notes (Signed)
Patient tx end 9 minutes early due to low bp 55/35. Blood was returned.  Only 517ml fluid removal; hypotension being the major factor with fluid removal.   Patient also had a temp off 100.4 when he arrived to HD room.  At 1530 it was 101.2 Dr Candiss Norse was notified and 2 sets of blood cultures were ordered.

## 2015-03-21 NOTE — Progress Notes (Signed)
Report called to [facility] in ccu, pt to trasfer there from HD

## 2015-03-21 NOTE — Care Management (Signed)
Admitted to [facility] with the diagnosis of pneumonia. Lives with friends. Family/friend  Member is [name] [phone]). See Dr. Netty Starring. Established dialysis patient on [place] per Dr. Candiss Norse.  Mr. Teed is now on Bi-Pap. No family/friends in room Shelbie Ammons RN MSN Care Management [phone]

## 2015-03-21 NOTE — Progress Notes (Signed)
Dr Benjie Karvonen made aware that CT scan result is available for review, states that she will look at result

## 2015-03-21 NOTE — Plan of Care (Signed)
Problem: Discharge Progression Outcomes Goal: Other Discharge Outcomes/Goals Outcome: Not Progressing Patient is from home, lives with a friend. Recently been in ED diagnosed with pneumonia, returned with same diagnoses.  Patient has extensive history--PAF, CHF, ESRD, GERD, anemia, history of bladder and prostate cancer. Continue with home medications. Dialysis days are Tuesday, Thursday and Saturday. Nephrology consult in place. Patient states he does urinate. [name], cousin, is POA--need documentation of advanced directive.   Patient is alert and oriented, soft spoken. No c/o pain at this time. Difficult time moving in bed, 2 assist with mobility. On 1200 ml fluid restriction. Left arm fistula has a strong bruit and thrill. Patient's MRSA PCR is positive, contact and droplet precautions in place. VSS, resting quietly.

## 2015-03-21 NOTE — Progress Notes (Signed)
Dr Candiss Norse made aware that pt bladder scan shows 523ml in bladder, new order for in and out cath x1, respiratory in with pt at this time placing bipap

## 2015-03-21 NOTE — Progress Notes (Signed)
[place] [facility] [place] NAME: [name]    MR#:  [id number]  DATE OF BIRTH:  01-23-1941  SUBJECTIVE:  Patient is complaining of abdominal pain. He says he has shortness of breath however he says this has improved since admission. Patient also has some nausea without vomiting or diarrhea.  REVIEW OF SYSTEMS:    Review of Systems  Constitutional: Negative for fever, chills and malaise/fatigue.  HENT: Negative for sore throat.   Eyes: Negative for blurred vision.  Respiratory: Positive for shortness of breath. Negative for cough, hemoptysis and wheezing.   Cardiovascular: Negative for chest pain, palpitations and leg swelling.  Gastrointestinal: Positive for nausea and abdominal pain. Negative for vomiting, diarrhea, constipation, blood in stool and melena.  Genitourinary: Negative for dysuria.  Musculoskeletal: Negative for back pain.  Neurological: Negative for dizziness, tremors and headaches.  Endo/Heme/Allergies: Does not bruise/bleed easily.    Tolerating Diet: No      DRUG ALLERGIES:   Allergies  Allergen Reactions  . Penicillins Hives, Itching and Swelling    Whelps, itching, and swelling    VITALS:  Blood pressure 102/56, pulse 79, temperature 97.7 F (36.5 C), temperature source Oral, resp. rate 40, height 6\' 2"  (1.88 m), weight 92.4 kg (203 lb 11.3 oz), SpO2 96 %.  PHYSICAL EXAMINATION:   Physical Exam  Constitutional: He is oriented to person, place, and time. He appears distressed.  HENT:  Head: Normocephalic.  Eyes: No scleral icterus.  Neck: Normal range of motion. Neck supple. No JVD present. No tracheal deviation present.  Cardiovascular: Normal rate, regular rhythm and normal heart sounds.  Exam reveals no gallop and no friction rub.   No murmur heard. Pulmonary/Chest: Effort normal and breath sounds normal. No respiratory distress. He has no wheezes. He has no rales. He exhibits no tenderness.   Abdominal: Soft. Bowel sounds are normal. He exhibits no distension and no mass. There is tenderness. There is no rebound and no guarding.  Musculoskeletal: Normal range of motion. He exhibits no edema.  Neurological: He is alert and oriented to person, place, and time.  Hoarse voice   Skin: Skin is warm. No rash noted. No erythema.  Psychiatric: Affect normal.      LABORATORY PANEL:   CBC  Recent Labs Lab 03/21/15 0518  WBC 8.9  HGB 6.9*  HCT 21.1*  PLT 153   ------------------------------------------------------------------------------------------------------------------  Chemistries   Recent Labs Lab 03/18/15 0504  03/21/15 0518  NA 136  < > 136  K 3.8  < > 3.4*  CL 96*  < > 95*  CO2 29  < > 29  GLUCOSE 137*  < > 113*  BUN 45*  < > 45*  CREATININE 7.62*  < > 7.79*  CALCIUM 8.7*  < > 8.2*  AST 12*  --   --   ALT 6*  --   --   ALKPHOS 74  --   --   BILITOT 0.8  --   --   < > = values in this interval not displayed. ------------------------------------------------------------------------------------------------------------------  Cardiac Enzymes  Recent Labs Lab 03/20/15 2229 03/21/15 0228 03/21/15 0813  TROPONINI 0.03 0.11* 0.04*   ------------------------------------------------------------------------------------------------------------------  RADIOLOGY:  Dg Chest Port 1 View  03/20/2015   CLINICAL DATA:  Short of breath.  Chest pain.  EXAM: PORTABLE CHEST - 1 VIEW  COMPARISON:  03/18/2015  FINDINGS: Cardiomegaly again seen. There is venous hypertension with mild interstitial edema. Areas of atelectasis in the  lower lobes are again noted. The aorta is ectatic and unfolded.  IMPRESSION: Similar appearance of likely congestive heart failure. Cardiomegaly with mild interstitial edema. Atelectasis at the bases.   Electronically Signed   By: Nelson Chimes M.D.   On: 03/20/2015 16:22     ASSESSMENT AND PLAN:   74 year old male with end-stage renal  disease on hemodialysis, congestive heart failure,. Paroxysmal atrial fibrillation, chronic anemia he was admitted for failed outpatient treatment for pneumonia.   1. Left lower lung continue quite pneumonia: Patient was on Levaquin however failed therapy. Patient is currently on Zosyn and Levaquin. I will follow up on sputum and blood culture.  2. Abdominal pain: Possibly concerning for ischemia? CT of the abdomen and pelvis has been ordered stat.  3. End-stage renal disease on hemodialysis: Patient will have dialysis today.  4. acute on chronic anemia: Patient has consented for blood transfusion. He will receive PRBCs while in dialysis.  5. Acute on chronic systolic and diastolic heart failure with EF of 45%: Patient will undergo dialysis which should help.  6. Paroxsmal atrial fib relation: Patient is currently normal sinus rhythm.  Rate is controlled. Continue amiodarone 7. Hypertension: Patient will continue Norvasc        Management plans discussed with the patient and he is in agreement.  CODE STATUS: FULL  TOTAL TIME TAKING CARE OF THIS PATIENT: 31 minutes.   Greater than 50% counseling and coordination of care  POSSIBLE D/C 2-3 days, DEPENDING ON CLINICAL CONDITION.   Charnee Turnipseed M.D on 03/21/2015 at 9:48 AM  Between 7am to 6pm - Pager - [phone] After 6pm go to [email or web address] - password [email or web address] [facility]  Office  [phone]  CC: Primary care physician; Dion Body, MD

## 2015-03-21 NOTE — Progress Notes (Signed)
Dr Candiss Norse made aware that pt with increased SOB, labored breathing, abdomen tender, new orders placed for Bipap, CT abdomen, HD after CT scan, bladder scan

## 2015-03-21 NOTE — Progress Notes (Signed)
Subjective:   Patient presents with cough and sputum He is admitted for management of pneumonia He reports poor appetite and feeling bad overall Reports abdominal distension and pain today Loose stool yesterday  Objective:  Vital signs in last 24 hours:  Temp:  [97.7 F (36.5 C)-98.3 F (36.8 C)] 97.7 F (36.5 C) (06/28 0153) Pulse Rate:  [79-91] 79 (06/28 0153) Resp:  [18-40] 40 (06/27 2029) BP: (97-117)/(56-68) 102/56 mmHg (06/28 0153) SpO2:  [91 %-100 %] 96 % (06/28 0234) Weight:  [92.4 kg (203 lb 11.3 oz)-96.616 kg (213 lb)] 92.4 kg (203 lb 11.3 oz) (06/28 0942)  Weight change:  Filed Weights   03/20/15 1533 03/20/15 2257 03/21/15 0942  Weight: 96.616 kg (213 lb) 92.488 kg (203 lb 14.4 oz) 92.4 kg (203 lb 11.3 oz)    Intake/Output:       Physical Exam: General: NAD, ill appearing  HEENT Moist mucus membranes  Neck supple  Pulm/lungs Clear ant and lat, slightly tachypceic  CVS/Heart Irregular, no rub  Abdomen:  Distender, + tender, bowel sounds present  Extremities: + edema  Neurologic: Alert, follows commands  Skin: No acute rashes  Access: AVG       Basic Metabolic Panel:  Recent Labs Lab 03/18/15 0504 03/20/15 1542 03/21/15 0518  NA 136 136 136  K 3.8 3.4* 3.4*  CL 96* 91* 95*  CO2 29 29 29   GLUCOSE 137* 145* 113*  BUN 45* 41* 45*  CREATININE 7.62* 7.46* 7.79*  CALCIUM 8.7* 8.6* 8.2*     CBC:  Recent Labs Lab 03/18/15 0504 03/20/15 1542 03/21/15 0518  WBC 9.9 11.8* 8.9  NEUTROABS 8.9*  --   --   HGB 8.1* 7.5* 6.9*  HCT 24.9* 23.2* 21.1*  MCV 91.8 90.8 90.3  PLT 205 187 153      Microbiology: Results for orders placed or performed during the hospital encounter of 03/20/15  Blood culture (routine x 2)     Status: None (Preliminary result)   Collection Time: 03/20/15  5:55 PM  Result Value Ref Range Status   Specimen Description BLOOD  Final   Special Requests Normal  Final   Culture NO GROWTH < 24 HOURS  Final   Report  Status PENDING  Incomplete  Blood culture (routine x 2)     Status: None (Preliminary result)   Collection Time: 03/20/15  5:55 PM  Result Value Ref Range Status   Specimen Description BLOOD  Final   Special Requests Normal  Final   Culture NO GROWTH < 24 HOURS  Final   Report Status PENDING  Incomplete  MRSA PCR Screening     Status: Abnormal   Collection Time: 03/20/15  9:43 PM  Result Value Ref Range Status   MRSA by PCR POSITIVE (A) NEGATIVE Final    Comment:        The GeneXpert MRSA Assay (FDA approved for NASAL specimens only), is one component of a comprehensive MRSA colonization surveillance program. It is not intended to diagnose MRSA infection nor to guide or monitor treatment for MRSA infections. CRITICAL RESULT CALLED TO, READ BACK BY AND VERIFIED WITH: CALLED TO MEGAN OAKLEY AT 0059 03/21/15.TSH     Coagulation Studies: No results for input(s): LABPROT, INR in the last 72 hours.  Urinalysis: No results for input(s): COLORURINE, LABSPEC, PHURINE, GLUCOSEU, HGBUR, BILIRUBINUR, KETONESUR, PROTEINUR, UROBILINOGEN, NITRITE, LEUKOCYTESUR in the last 72 hours.  Invalid input(s): APPERANCEUR    Imaging: Dg Chest Port 1 View  03/20/2015   CLINICAL  DATA:  Short of breath.  Chest pain.  EXAM: PORTABLE CHEST - 1 VIEW  COMPARISON:  03/18/2015  FINDINGS: Cardiomegaly again seen. There is venous hypertension with mild interstitial edema. Areas of atelectasis in the lower lobes are again noted. The aorta is ectatic and unfolded.  IMPRESSION: Similar appearance of likely congestive heart failure. Cardiomegaly with mild interstitial edema. Atelectasis at the bases.   Electronically Signed   By: Nelson Chimes M.D.   On: 03/20/2015 16:22     Medications:     . sodium chloride   Intravenous Once  . amiodarone  200 mg Oral Daily  . amLODipine  5 mg Oral Daily  . atorvastatin  40 mg Oral Daily  . budesonide-formoterol  2 puff Inhalation BID  . cefTAZidime (FORTAZ)  IV  1 g  Intravenous Q24H  . Chlorhexidine Gluconate Cloth  6 each Topical Q0600  . feeding supplement (NEPRO CARB STEADY)  237 mL Oral BID BM  . heparin  5,000 Units Subcutaneous 3 times per day  . [START ON 03/22/2015] levofloxacin (LEVAQUIN) IV  500 mg Intravenous Q48H  . magnesium oxide  400 mg Oral QHS  . multivitamin  1 tablet Oral Daily  . mupirocin ointment  1 application Nasal BID  . pantoprazole  40 mg Oral Daily  . sevelamer carbonate  800 mg Oral TID WC  . tiotropium  18 mcg Inhalation Daily  . vancomycin  1,000 mg Intravenous Once per day on Tue Thu Sat   acetaminophen **OR** acetaminophen, albuterol, alum & mag hydroxide-simeth, ondansetron **OR** ondansetron (ZOFRAN) IV, pneumococcal 23 valent vaccine  Assessment/ Plan:  74 y.o. 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 Will dialyze later today K 3.4  2. Anemia of chronic kidney disease:  EPO is avoided due to bladder cancer, prostate cancer hgb 6.9 at last check, blood transfusion planned  3. Secondary Hyperparathyroidism: N25.81 - monitor phos  - Hold sevelamer  4.  Abdominal pain and distention - recent CT concerning for AAA leak - Repeat CT today with contrast  5. A. Fib with h/o RVR: continues on PO amiodarone.  6.  Pneumonia, likely bacteria- Empiric treatment with Vanc Zosyn  7. Urinary retention- trial of I/O cath. U/a and culture   LOS: 1 Margaurite Salido 6/28/201610:28 AM

## 2015-03-22 DIAGNOSIS — E440 Moderate protein-calorie malnutrition: Secondary | ICD-10-CM | POA: Diagnosis present

## 2015-03-22 DIAGNOSIS — R1084 Generalized abdominal pain: Secondary | ICD-10-CM

## 2015-03-22 DIAGNOSIS — R109 Unspecified abdominal pain: Secondary | ICD-10-CM | POA: Diagnosis present

## 2015-03-22 LAB — BASIC METABOLIC PANEL
Anion gap: 8 (ref 5–15)
BUN: 24 mg/dL — AB (ref 6–20)
CO2: 34 mmol/L — ABNORMAL HIGH (ref 22–32)
Calcium: 8.4 mg/dL — ABNORMAL LOW (ref 8.9–10.3)
Chloride: 99 mmol/L — ABNORMAL LOW (ref 101–111)
Creatinine, Ser: 5.08 mg/dL — ABNORMAL HIGH (ref 0.61–1.24)
GFR calc Af Amer: 12 mL/min — ABNORMAL LOW (ref >60)
GFR calc non Af Amer: 10 mL/min — ABNORMAL LOW (ref >60)
Glucose, Bld: 107 mg/dL — ABNORMAL HIGH (ref 65–99)
POTASSIUM: 3.9 mmol/L (ref 3.5–5.1)
SODIUM: 141 mmol/L (ref 135–145)

## 2015-03-22 LAB — CBC
HEMATOCRIT: 24.9 % — AB (ref 40.0–52.0)
Hemoglobin: 8.1 g/dL — ABNORMAL LOW (ref 13.0–18.0)
MCH: 29.1 pg (ref 26.0–34.0)
MCHC: 32.4 g/dL (ref 32.0–36.0)
MCV: 89.6 fL (ref 80.0–100.0)
Platelets: 158 10*3/uL (ref 150–440)
RBC: 2.77 MIL/uL — ABNORMAL LOW (ref 4.40–5.90)
RDW: 18.4 % — AB (ref 11.5–14.5)
WBC: 10.7 10*3/uL — ABNORMAL HIGH (ref 3.8–10.6)

## 2015-03-22 LAB — TYPE AND SCREEN
ABO/RH(D): A NEG
ANTIBODY SCREEN: NEGATIVE
UNIT DIVISION: 0

## 2015-03-22 NOTE — Progress Notes (Signed)
Patient is A&Ox4. Denies pain. NSR and VSS during shift. Patient transferred to 1C room 114 by bed on 2L nasal cannula O2 with Monica Martinez and [place], orderlys. No distress noted when leaving ICU.  [name] B

## 2015-03-22 NOTE — Plan of Care (Signed)
Problem: Discharge Progression Outcomes Goal: Other Discharge Outcomes/Goals Outcome: Progressing Plan of care progress: Pneumonia: -transfer from ccu today -Continues IV ABTs -monitor temperature -monitor labs, WBC -blood tx yesterday, HGB now 8.1 -o2 in place at 2L [place], dyspnea on exertion -no complaints of pain, pt voices any needs

## 2015-03-22 NOTE — Progress Notes (Signed)
Initial Nutrition Assessment  DOCUMENTATION CODES:  Non-severe (moderate) malnutrition in context of chronic illness  INTERVENTION: Medical Food Supplement Therapy: continue Nepro shake BID Meals and Snacks: Cater to patient preferences   NUTRITION DIAGNOSIS:  Inadequate oral intake related to chronic illness as evidenced by estimated needs, per patient/family report.   GOAL:  Patient will meet greater than or equal to 90% of their needs   MONITOR:   (Energy Intake, Anthropometrics, Digestive System, Electrolyte/Renal Profile, Glucose Profile)  REASON FOR ASSESSMENT:   (Dialysis, Renal Diet)    ASSESSMENT:  Pt admitted with pneumonia, hypotension post HD, mild abdominal pain without mesenteric ischemia, acute on chronic CHF. Complains of weakness  PMHx:  Past Medical History  Diagnosis Date  . PAF (paroxysmal atrial fibrillation)     a. new onset 12/2014 in the setting of UTI, sepsis, hypotension, and anemia; b. not on long term anticoagulation given anemia; c. family aware of stroke risk, they are ok with this; d. on amiodarone   . Chronic combined systolic and diastolic CHF (congestive heart failure)     a. echo 12/2014: EF 25-30%, anterior wall wall motion abnormalities; b. planned ischemic evaluation once patient is stable medically; c. echo 01/2015: EF 45-50%, no RWMA, mild AT, LA mildly dilated, mod pericardial effusion along LV free wall, no evidence of hemodynamic compromise  . ESRD on hemodialysis     a. Tuesday, Thursday, and Saturdays  . Anemia     a. baseline hgb ~ 8  . History of small bowel obstruction     a. 01/2015  . History of septic shock     a. 01/2015  . GERD (gastroesophageal reflux disease)   . Allergic rhinitis   . COPD (chronic obstructive pulmonary disease)   . Hypercholesteremia   . Cognitive communication deficit   . Magnesium deficiency   . Dementia   . Chronic indwelling Foley catheter   . Iliac aneurysm   . Incontinence   .  Hydronephrosis   . Overactive bladder   . Detrusor sphincter dyssynergia   . Mini stroke   . Bladder cancer   . Prostate cancer   . Renal insufficiency   . Hypertension     Diet Order: Renal  Current Nutrition: pt ate almost 100% of lunch today including chicken, mashed potatoes, orange sherbet. Ate 50% at breakfast this AM.  Receiving Nepro shakes BID  Food/Nutrition-Related History: pt reports appetite is about the same here as it is at home, did not elaborate further   Medications: MVI  Electrolyte/Renal Profile and Glucose Profile:   Recent Labs Lab 03/20/15 1542 03/21/15 0518 03/22/15 0544  NA 136 136 141  K 3.4* 3.4* 3.9  CL 91* 95* 99*  CO2 29 29 34*  BUN 41* 45* 24*  CREATININE 7.46* 7.79* 5.08*  CALCIUM 8.6* 8.2* 8.4*  GLUCOSE 145* 113* 107*   Protein Profile:   Recent Labs Lab 03/18/15 0504  ALBUMIN 2.9*    Gastrointestinal Profile: +abdominal pain, no N/V Last BM: today   Nutrition-Focused Physical Exam Findings: Nutrition-Focused physical exam completed. Findings are mild fat depletion noted in orbital region, mild/moderate muscle depletion in temporal, clavicle, knee, thigh and calf regions , and mild edema.    Weight Change: pt reports weight loss but cannot tell writer how much, does not know UBW  Height:  Ht Readings from Last 1 Encounters:  03/20/15 6\' 2"  (1.88 m)    Weight:  Wt Readings from Last 1 Encounters:  03/21/15 202 lb 13.2 oz (  92 kg)    Filed Weights   03/20/15 2257 03/21/15 0942 03/21/15 1751  Weight: 203 lb 14.4 oz (92.488 kg) 203 lb 11.3 oz (92.4 kg) 202 lb 13.2 oz (92 kg)    Wt Readings from Last 10 Encounters:  03/21/15 202 lb 13.2 oz (92 kg)  03/18/15 210 lb (95.255 kg)  03/09/15 207 lb (93.895 kg)  02/27/15 209 lb (94.802 kg)  02/27/15 209 lb (94.802 kg)  02/16/15 199 lb 15.3 oz (90.7 kg)  02/08/15 211 lb (95.709 kg)  02/04/15 206 lb 12.7 oz (93.8 kg)    BMI:  Body mass index is 26.03  kg/(m^2).  Estimated Nutritional Needs:  Kcal:  2283-2699 kcals (BEE 1730, 1.2 AF, 1.1-1.3 IF) using current wt of 92 kg  Protein:  110-138 g (1.2-1.5 g/kg)  using current wt of 92 kg  Fluid:  1000 mL plus UOP  Skin:     Diet Order:  Diet renal with fluid restriction Fluid restriction:: 1200 mL Fluid; Room service appropriate?: Yes; Fluid consistency:: Thin  EDUCATION NEEDS:  No education needs identified at this time   Intake/Output Summary (Last 24 hours) at 03/22/15 1440 Last data filed at 03/22/15 1337  Gross per 24 hour  Intake    590 ml  Output    501 ml  Net     89 ml   MODERATE Care Level  [facility] MS, RD, LDN [phone] Pager

## 2015-03-22 NOTE — Consult Note (Signed)
Patient ID: [name], male   DOB: 08-25-1941, 74 y.o.   MRN: [id number]  Chief Complaint  Patient presents with  . Shortness of Breath    HPI Location, Quality, Duration, Severity, Timing, Context, Modifying Factors, Associated Signs and Symptoms.  [name] is a 74 y.o. male.  Recently admitted to the medical service in the intensive care unit with a pneumonia. 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 Yesterday began having abdominal pain. Initial CT scan was performed upon admission demonstrated some enteritis. The patient was admitted to the intensive care unit briefly following hypotension around the time of hemostasis hemodialysis. Since been discharged from the intensive care unit his back to the floor. He states he had a bowel movement today. He's had no nausea no vomiting no fever. He's had no passage of gas today. 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 There was signs of small bowel dilatation with decompressed small bowel. As such general surgery was asked to comment. The patient denies any previous abdominal operations. He's had no history of bowel obstruction or history of diverticulitis. HPI  Past Medical History  Diagnosis Date  . PAF (paroxysmal atrial fibrillation)     a. new onset 12/2014 in the setting of UTI, sepsis, hypotension, and anemia; b. not on long term anticoagulation given anemia; c. family aware of stroke risk, they are ok with this; d. on amiodarone   . Chronic combined systolic and diastolic CHF (congestive heart failure)     a. echo 12/2014: EF 25-30%, anterior wall wall motion abnormalities; b. planned ischemic evaluation once patient is stable medically; c. echo 01/2015: EF 45-50%, no RWMA, mild AT, LA mildly dilated, mod pericardial effusion along LV free wall, no evidence of hemodynamic compromise  . ESRD on  hemodialysis     a. Tuesday, Thursday, and Saturdays  . Anemia     a. baseline hgb ~ 8  . History of small bowel obstruction     a. 01/2015  . History of septic shock     a. 01/2015  . GERD (gastroesophageal reflux disease)   . Allergic rhinitis   . COPD (chronic obstructive pulmonary disease)   . Hypercholesteremia   . Cognitive communication deficit   . Magnesium deficiency   . Dementia   . Chronic indwelling Foley catheter   . Iliac aneurysm   . Incontinence   . Hydronephrosis   . Overactive bladder   . Detrusor sphincter dyssynergia   . Mini stroke   . Bladder cancer   . Prostate cancer   . Renal insufficiency   . Hypertension     Past Surgical History  Procedure Laterality Date  . Cystoscopy w/ ureteral stent placement    . Transurethral resection of bladder tumor with gyrus (turbt-gyrus)    . Ureteral stent placement    . Prostate surgery      Removal  . Av fistula placement      left arm    Family History  Problem Relation Age of Onset  . Stroke Mother     Social History History  Substance Use Topics  . Smoking status: Light Tobacco Smoker -- 0.25 packs/day for 56 years    Last Attempt to Quit: 01/11/2015  . Smokeless tobacco: Never Used  . Alcohol Use: No    Allergies  Allergen Reactions  . Penicillins Hives, Itching and Swelling    Whelps,  itching, and swelling    Current Facility-Administered Medications  Medication Dose Route Frequency Provider Last Rate Last Dose  . 0.9 %  sodium chloride infusion   Intravenous Once Bettey Costa, MD      . acetaminophen (TYLENOL) tablet 650 mg  650 mg Oral Q6H PRN Loletha Grayer, MD   650 mg at 03/21/15 1545   Or  . acetaminophen (TYLENOL) suppository 650 mg  650 mg Rectal Q6H PRN Loletha Grayer, MD      . albuterol (PROVENTIL) (2.5 MG/3ML) 0.083% nebulizer solution 2.5 mg  2.5 mg Nebulization Q6H PRN Loletha Grayer, MD      . alum & mag hydroxide-simeth (MAALOX/MYLANTA) 200-200-20 MG/5ML suspension 30 mL   30 mL Oral Q6H PRN Loletha Grayer, MD      . amiodarone (PACERONE) tablet 200 mg  200 mg Oral Daily Loletha Grayer, MD   200 mg at 03/22/15 0926  . atorvastatin (LIPITOR) tablet 40 mg  40 mg Oral Daily Loletha Grayer, MD   40 mg at 03/22/15 0926  . budesonide-formoterol (SYMBICORT) 160-4.5 MCG/ACT inhaler 2 puff  2 puff Inhalation BID Loletha Grayer, MD   2 puff at 03/22/15 [phone]  . cefTAZidime (FORTAZ) 1 g in dextrose 5 % 50 mL IVPB  1 g Intravenous Q24H Loletha Grayer, MD   1 g at 03/21/15 2029  . Chlorhexidine Gluconate Cloth 2 % PADS 6 each  6 each Topical Q0600 Bettey Costa, MD   6 each at 03/22/15 0600  . feeding supplement (NEPRO CARB STEADY) liquid 237 mL  237 mL Oral BID BM Loletha Grayer, MD   237 mL at 03/22/15 1337  . heparin injection 5,000 Units  5,000 Units Subcutaneous 3 times per day Loletha Grayer, MD   5,000 Units at 03/22/15 1337  . multivitamin (RENA-VIT) tablet 1 tablet  1 tablet Oral Daily Loletha Grayer, MD   1 tablet at 03/22/15 [phone]  . mupirocin ointment (BACTROBAN) 2 % 1 application  1 application Nasal BID Bettey Costa, MD   1 application at 81/01/75 0932  . ondansetron (ZOFRAN) tablet 4 mg  4 mg Oral Q6H PRN Loletha Grayer, MD       Or  . ondansetron [facility]) injection 4 mg  4 mg Intravenous Q6H PRN Loletha Grayer, MD      . pantoprazole (PROTONIX) EC tablet 40 mg  40 mg Oral Daily Loletha Grayer, MD   40 mg at 03/22/15 0926  . pneumococcal 23 valent vaccine (PNU-IMMUNE) injection 0.5 mL  0.5 mL Intramuscular Prior to discharge Loletha Grayer, MD      . tiotropium [facility]) inhalation capsule 18 mcg  18 mcg Inhalation Daily Loletha Grayer, MD   18 mcg at 03/22/15 1337  . vancomycin (VANCOCIN) IVPB 1000 mg/200 mL premix  1,000 mg Intravenous Once per day on Tue Thu Sat Loletha Grayer, MD   1,000 mg at 03/21/15 1641      Review of Systems A 10 point review of systems was asked and was negative except for the following positive findings as described  above.  Blood pressure 106/53, pulse 78, temperature 98 F (36.7 C), temperature source Oral, resp. rate 19, height _0  (1.88 m), weight 92 kg (202 lb 13.2 oz), SpO2 97 %.  Results for orders placed or performed during the hospital encounter of 03/20/15 (from the past 48 hour(s))  Basic metabolic panel     Status: Abnormal   Collection Time: 03/20/15  3:42 PM  Result Value Ref Range   Sodium 136  135 - 145 mmol/L   Potassium 3.4 (L) 3.5 - 5.1 mmol/L   Chloride 91 (L) 101 - 111 mmol/L   CO2 29 22 - 32 mmol/L   Glucose, Bld 145 (H) 65 - 99 mg/dL   BUN 41 (H) 6 - 20 mg/dL   Creatinine, Ser 7.46 (H) 0.61 - 1.24 mg/dL   Calcium 8.6 (L) 8.9 - 10.3 mg/dL   GFR calc non Af Amer 6 (L) >60 mL/min   GFR calc Af Amer 7 (L) >60 mL/min    Comment: (NOTE) The eGFR has been calculated using the CKD EPI equation. This calculation has not been validated in all clinical situations. eGFR's persistently <60 mL/min signify possible Chronic Kidney Disease.    Anion gap 16 (H) 5 - 15  CBC     Status: Abnormal   Collection Time: 03/20/15  3:42 PM  Result Value Ref Range   WBC 11.8 (H) 3.8 - 10.6 K/uL   RBC 2.55 (L) 4.40 - 5.90 MIL/uL   Hemoglobin 7.5 (L) 13.0 - 18.0 g/dL   HCT 23.2 (L) 40.0 - 52.0 %   MCV 90.8 80.0 - 100.0 fL   MCH 29.4 26.0 - 34.0 pg   MCHC 32.4 32.0 - 36.0 g/dL   RDW 18.9 (H) 11.5 - 14.5 %   Platelets 187 150 - 440 K/uL  Troponin I     Status: Abnormal   Collection Time: 03/20/15  3:42 PM  Result Value Ref Range   Troponin I 0.05 (H) <0.031 ng/mL    Comment: READ BACK AND VERIFIED WITH GREG MOYER AT 1623 03/20/2015 BY TFK        PERSISTENTLY INCREASED TROPONIN VALUES IN THE RANGE OF 0.04-0.49 ng/mL CAN BE SEEN IN:       -UNSTABLE ANGINA       -CONGESTIVE HEART FAILURE       -MYOCARDITIS       -CHEST TRAUMA       -ARRYHTHMIAS       -LATE PRESENTING MYOCARDIAL INFARCTION       -COPD   CLINICAL FOLLOW-UP RECOMMENDED.   Ferritin     Status: Abnormal   Collection Time:  03/20/15  3:42 PM  Result Value Ref Range   Ferritin 1513 (H) 24 - 336 ng/mL  Iron and TIBC     Status: Abnormal   Collection Time: 03/20/15  3:42 PM  Result Value Ref Range   Iron 22 (L) 45 - 182 ug/dL   TIBC 182 (L) 250 - 450 ug/dL   Saturation Ratios 12 (L) 17.9 - 39.5 %   UIBC 160 ug/dL  Lactic acid, plasma     Status: Abnormal   Collection Time: 03/20/15  4:43 PM  Result Value Ref Range   Lactic Acid, Venous 3.1 (HH) 0.5 - 2.0 mmol/L    Comment: CRITICAL RESULT CALLED TO, READ BACK BY AND VERIFIED WITH RACHEL VERDI AT 1735 03/20/2015 BY TFK   Blood culture (routine x 2)     Status: None (Preliminary result)   Collection Time: 03/20/15  5:55 PM  Result Value Ref Range   Specimen Description BLOOD    Special Requests Normal    Culture NO GROWTH 2 DAYS    Report Status PENDING   Blood culture (routine x 2)     Status: None (Preliminary result)   Collection Time: 03/20/15  5:55 PM  Result Value Ref Range   Specimen Description BLOOD    Special Requests Normal    Culture NO GROWTH  2 DAYS    Report Status PENDING   MRSA PCR Screening     Status: Abnormal   Collection Time: 03/20/15  9:43 PM  Result Value Ref Range   MRSA by PCR POSITIVE (A) NEGATIVE    Comment:        The GeneXpert MRSA Assay (FDA approved for NASAL specimens only), is one component of a comprehensive MRSA colonization surveillance program. It is not intended to diagnose MRSA infection nor to guide or monitor treatment for MRSA infections. CRITICAL RESULT CALLED TO, READ BACK BY AND VERIFIED WITH: CALLED TO MEGAN OAKLEY AT 0059 03/21/15.TSH   Troponin I (q 6hr x 3)     Status: None   Collection Time: 03/20/15 10:29 PM  Result Value Ref Range   Troponin I 0.03 <0.031 ng/mL    Comment:        NO INDICATION OF MYOCARDIAL INJURY.   Lactic acid, plasma     Status: None   Collection Time: 03/20/15 10:30 PM  Result Value Ref Range   Lactic Acid, Venous 1.4 0.5 - 2.0 mmol/L  Culture, expectorated  sputum-assessment     Status: None   Collection Time: 03/20/15 11:01 PM  Result Value Ref Range   Specimen Description SPUTUM    Special Requests Normal    Sputum evaluation THIS SPECIMEN IS ACCEPTABLE FOR SPUTUM CULTURE    Report Status 03/21/2015 FINAL   Culture, respiratory (NON-Expectorated)     Status: None (Preliminary result)   Collection Time: 03/20/15 11:01 PM  Result Value Ref Range   Specimen Description SPUTUM    Special Requests Normal Reflexed from [id number]    Gram Stain PENDING    Culture APPEARS TO BE NORMAL FLORA    Report Status PENDING   Troponin I (q 6hr x 3)     Status: Abnormal   Collection Time: 03/21/15  2:28 AM  Result Value Ref Range   Troponin I 0.11 (H) <0.031 ng/mL    Comment: RESULTS PREVIOUSLY CALLED 03/20/15 _0  BY TFK.Marland KitchenMarland KitchenAJO        PERSISTENTLY INCREASED TROPONIN VALUES IN THE RANGE OF 0.04-0.49 ng/mL CAN BE SEEN IN:       -UNSTABLE ANGINA       -CONGESTIVE HEART FAILURE       -MYOCARDITIS       -CHEST TRAUMA       -ARRYHTHMIAS       -LATE PRESENTING MYOCARDIAL INFARCTION       -COPD   CLINICAL FOLLOW-UP RECOMMENDED.   Basic metabolic panel     Status: Abnormal   Collection Time: 03/21/15  5:18 AM  Result Value Ref Range   Sodium 136 135 - 145 mmol/L   Potassium 3.4 (L) 3.5 - 5.1 mmol/L   Chloride 95 (L) 101 - 111 mmol/L   CO2 29 22 - 32 mmol/L   Glucose, Bld 113 (H) 65 - 99 mg/dL   BUN 45 (H) 6 - 20 mg/dL   Creatinine, Ser 7.79 (H) 0.61 - 1.24 mg/dL   Calcium 8.2 (L) 8.9 - 10.3 mg/dL   GFR calc non Af Amer 6 (L) >60 mL/min   GFR calc Af Amer 7 (L) >60 mL/min    Comment: (NOTE) The eGFR has been calculated using the CKD EPI equation. This calculation has not been validated in all clinical situations. eGFR's persistently <60 mL/min signify possible Chronic Kidney Disease.    Anion gap 12 5 - 15  CBC     Status: Abnormal   Collection Time:  03/21/15  5:18 AM  Result Value Ref Range   WBC 8.9 3.8 - 10.6 K/uL   RBC 2.33 (L) 4.40 -  5.90 MIL/uL   Hemoglobin 6.9 (L) 13.0 - 18.0 g/dL   HCT 21.1 (L) 40.0 - 52.0 %   MCV 90.3 80.0 - 100.0 fL   MCH 29.8 26.0 - 34.0 pg   MCHC 33.0 32.0 - 36.0 g/dL   RDW 18.5 (H) 11.5 - 14.5 %   Platelets 153 150 - 440 K/uL  Troponin I (q 6hr x 3)     Status: Abnormal   Collection Time: 03/21/15  8:13 AM  Result Value Ref Range   Troponin I 0.04 (H) <0.031 ng/mL    Comment: READ BACK AND VERIFIED CINDY SMITH AT 7353 03/21/15 BOD        PERSISTENTLY INCREASED TROPONIN VALUES IN THE RANGE OF 0.04-0.49 ng/mL CAN BE SEEN IN:       -UNSTABLE ANGINA       -CONGESTIVE HEART FAILURE       -MYOCARDITIS       -CHEST TRAUMA       -ARRYHTHMIAS       -LATE PRESENTING MYOCARDIAL INFARCTION       -COPD   CLINICAL FOLLOW-UP RECOMMENDED.   Type and screen     Status: None   Collection Time: 03/21/15  8:13 AM  Result Value Ref Range   ABO/RH(D) A NEG    Antibody Screen NEG    Sample Expiration 03/24/2015    Unit Number [id number]    Blood Component Type RED CELLS,LR    Unit division 00    Status of Unit ISSUED,FINAL    Transfusion Status OK TO TRANSFUSE    Crossmatch Result Compatible   Prepare RBC     Status: None   Collection Time: 03/21/15  8:13 AM  Result Value Ref Range   Order Confirmation ORDER PROCESSED BY BLOOD BANK   Urine culture     Status: None (Preliminary result)   Collection Time: 03/21/15 10:43 AM  Result Value Ref Range   Specimen Description URINE, RANDOM    Special Requests NONE    Culture TOO YOUNG TO READ    Report Status PENDING   Urinalysis complete, with microscopic ([facility] only)     Status: Abnormal   Collection Time: 03/21/15 10:43 AM  Result Value Ref Range   Color, Urine YELLOW (A) YELLOW   APPearance TURBID (A) CLEAR   Glucose, UA NEGATIVE NEGATIVE mg/dL   Bilirubin Urine NEGATIVE NEGATIVE   Ketones, ur NEGATIVE NEGATIVE mg/dL   Specific Gravity, Urine 1.012 1.005 - 1.030   Hgb urine dipstick 2+ (A) NEGATIVE   pH 7.0 5.0 - 8.0   Protein, ur >500  (A) NEGATIVE mg/dL   Nitrite NEGATIVE NEGATIVE   Leukocytes, UA 2+ (A) NEGATIVE   RBC / HPF NONE SEEN 0 - 5 RBC/hpf   WBC, UA TOO NUMEROUS TO COUNT 0 - 5 WBC/hpf   Bacteria, UA RARE (A) NONE SEEN   Squamous Epithelial / LPF NONE SEEN NONE SEEN  Culture, blood (routine x 2)     Status: None (Preliminary result)   Collection Time: 03/21/15  3:30 PM  Result Value Ref Range   Specimen Description BLOOD    Special Requests NONE    Culture NO GROWTH < 24 HOURS    Report Status PENDING   Culture, blood (routine x 2)     Status: None (Preliminary result)   Collection Time: 03/21/15  3:54  PM  Result Value Ref Range   Specimen Description BLOOD    Special Requests NONE    Culture NO GROWTH < 24 HOURS    Report Status PENDING   CBC     Status: Abnormal   Collection Time: 03/22/15  5:44 AM  Result Value Ref Range   WBC 10.7 (H) 3.8 - 10.6 K/uL   RBC 2.77 (L) 4.40 - 5.90 MIL/uL   Hemoglobin 8.1 (L) 13.0 - 18.0 g/dL   HCT 24.9 (L) 40.0 - 52.0 %   MCV 89.6 80.0 - 100.0 fL   MCH 29.1 26.0 - 34.0 pg   MCHC 32.4 32.0 - 36.0 g/dL   RDW 18.4 (H) 11.5 - 14.5 %   Platelets 158 150 - 440 K/uL  Basic metabolic panel     Status: Abnormal   Collection Time: 03/22/15  5:44 AM  Result Value Ref Range   Sodium 141 135 - 145 mmol/L   Potassium 3.9 3.5 - 5.1 mmol/L   Chloride 99 (L) 101 - 111 mmol/L   CO2 34 (H) 22 - 32 mmol/L   Glucose, Bld 107 (H) 65 - 99 mg/dL   BUN 24 (H) 6 - 20 mg/dL   Creatinine, Ser 5.08 (H) 0.61 - 1.24 mg/dL   Calcium 8.4 (L) 8.9 - 10.3 mg/dL   GFR calc non Af Amer 10 (L) >60 mL/min   GFR calc Af Amer 12 (L) >60 mL/min    Comment: (NOTE) The eGFR has been calculated using the CKD EPI equation. This calculation has not been validated in all clinical situations. eGFR's persistently <60 mL/min signify possible Chronic Kidney Disease.    Anion gap 8 5 - 15   Dg Chest Port 1 View  03/20/2015   CLINICAL DATA:  Short of breath.  Chest pain.  EXAM: PORTABLE CHEST - 1 VIEW   COMPARISON:  03/18/2015  FINDINGS: Cardiomegaly again seen. There is venous hypertension with mild interstitial edema. Areas of atelectasis in the lower lobes are again noted. The aorta is ectatic and unfolded.  IMPRESSION: Similar appearance of likely congestive heart failure. Cardiomegaly with mild interstitial edema. Atelectasis at the bases.   Electronically Signed   By: Nelson Chimes M.D.   On: 03/20/2015 16:22   Ct Angio Abd/pel W/ And/or W/o  03/21/2015   CLINICAL DATA:  Abdominal pain, nausea, possibly mesenteric ischemia. On dialysis.  EXAM: CT ANGIOGRAPHY ABDOMEN AND PELVIS  TECHNIQUE: Multidetector CT imaging of the abdomen and pelvis was performed using the standard protocol during bolus administration of intravenous contrast. Multiplanar reconstructed images including MIPs were obtained and reviewed to evaluate the vascular anatomy.  CONTRAST:  161m OMNIPAQUE IOHEXOL 350 MG/ML SOLN  COMPARISON:  COMPARISON 03/18/2015  FINDINGS: ARTERIAL FINDINGS:  Coronary calcifications.  Aorta: The visualized distal descending thoracic segment is ectatic and tortuous measured up to 3.8 cm transverse diameter. There is moderate calcified plaque in the suprarenal segment which is ectatic dilated up to 4 cm axial diameter, stable by my measurement. Patent infrarenal bifurcated aortic stent graft. Large type 2 endoleak related to patent lumbar arteries and inferior mesenteric artery. Native aneurysm sac diameter 5.8 x 5.4 cm (previously 5.5 x 5.2).  Celiac axis: Mild short-segment ostial plaque without significant stenosis. Ectatic and widely patent distally.  Superior mesenteric: Scattered plaque, but diffusely ectatic, no dissection or stenosis.  Left renal:           Single, widely patent  Right renal:  Single, widely patent  Inferior mesenteric: Continued patency, associated with type 2 endoleak in the partially excluded aortic aneurysm sac.  Left iliac: Patent left limb of the stent graft extends to the  mid external iliac artery. There has been coil embolization of the internal iliac artery, with significant streak artifact. Tortuosity of the native distal external iliac artery. Distal native vessels are diffusely ectatic.  Right iliac: Right limb of the stent graft is patent, extending to the proximal external iliac. Coil embolization of the internal iliac artery with some resultant streak artifact. Mild tortuosity and atheromatous plaque involving the distal native external iliac artery without stenosis or dissection. There is a large aneurysm or pseudoaneurysm involving the distal common femoral artery measuring up to 6.5 cm maximum axial diameter, with ectasia of the visualized proximal SFA.  Venous findings: Patent hepatic veins, portal vein, superior mesenteric vein, splenic vein, bilateral renal veins, IVC. Streak artifact from internal iliac embolization coils limits assessment of the iliac venous system.  Review of the MIP images confirms the above findings.  Nonvascular findings: Small pleural effusions right greater than left with associated atelectasis/ consolidation posteriorly in the lower lobes, increased since previous exam. Unremarkable liver, nondilated gallbladder, spleen, pancreas. Bilateral hydronephrosis, with left double-J ureteral stent in place. Stable bilateral adrenal nodules.  Stomach is nondilated. Several dilated mid abdominal small bowel loops without discrete transition point. The distal small bowel and colon remain decompressed. Scattered descending diverticula. Urinary bladder is incompletely distended, thick-walled. Right-sided bladder diverticulum. New pelvic ascites. No adenopathy localized.  IMPRESSION: 1. No significant proximal mesenteric arterial occlusive disease to suggest an etiology of occlusive mesenteric ischemia. 2. Dilatation of mid abdominal bowel loops, decompressed distally, without discrete transition point. 3. Patent infrarenal bifurcated stent graft with  persistent type 2 endoleak and slight enlargement native aneurysm sac, now 5.8 cm diameter. 4. 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 5. New pelvic ascites 6. Increasing Bilateral pleural effusions with worsening consolidation/atelectasis in the lung bases.   Electronically Signed   By: Lucrezia Europe M.D.   On: 03/21/2015 13:13    Physical Exam CONSTITUTIONAL:  Pleasant, ill-appearing male in no acute distress. EYES: Pupils equal and reactive to light, Sclera non-icteric EARS, NOSE, MOUTH AND THROAT:  The oropharynx was clear.  Dentition is good repair.  Oral mucosa pink and moist. LYMPH NODES:  Lymph nodes in the neck and axillae were normal RESPIRATORY:  Lungs were clear.  Normal respiratory effort without pathologic use of accessory muscles of respiration CARDIOVASCULAR: Heart was regular without murmurs.  There were no carotid bruits. GI: The abdomen was soft, nontender, and distended. There were no palpable masses. There was no hepatosplenomegaly. There were normal bowel sounds in all quadrants. GU:  Rectal deferred.   MUSCULOSKELETAL:  Normal muscle strength and tone.  No clubbing or cyanosis.   SKIN:  There were no pathologic skin lesions.  There were no nodules on palpation. NEUROLOGIC:  Sensation is normal.  Cranial nerves are grossly intact. PSYCH:  Oriented to person, place and time.  Mood and affect are normal.  Data Reviewed CT scans dated 6/25 and 6/28 as described above.  I have personally reviewed the patient's imaging, laboratory findings and medical records.    Assessment    Abdominal pain small bowel enteritis versus ileus in a patient with pneumonia and multiple medical problems. I see no indication for surgical intervention at this time. Patient has pseudoaneurysms faster surgery which will be consulted to comment.  Plan    Supportive care  Boyd electrolyte allergies. Treatment of underlying pneumonia. We will follow.      Sherri Rad 03/22/2015, 3:36 PM

## 2015-03-22 NOTE — Progress Notes (Signed)
Subjective:   Patient presents with cough and sputum He is admitted for management of pneumonia He reports poor appetite and feeling bad overall Reports abdominal distension and pain today Was transferred to ICU for monitoring of hypotension CT abdomen report reviewed  Objective:  Vital signs in last 24 hours:  Temp:  [99.4 F (37.4 C)-101.2 F (38.4 C)] 99.4 F (37.4 C) (06/28 1858) Pulse Rate:  [67-94] 70 (06/29 0800) Resp:  [14-36] 23 (06/29 0900) BP: (55-131)/(35-64) 131/57 mmHg (06/29 0900) SpO2:  [94 %-100 %] 100 % (06/29 0900) FiO2 (%):  [35 %] 35 % (06/28 1858) Weight:  [92 kg (202 lb 13.2 oz)-92.4 kg (203 lb 11.3 oz)] 92 kg (202 lb 13.2 oz) (06/28 1751)  Weight change: -4.216 kg (-9 lb 4.7 oz) Filed Weights   03/20/15 2257 03/21/15 0942 03/21/15 1751  Weight: 92.488 kg (203 lb 14.4 oz) 92.4 kg (203 lb 11.3 oz) 92 kg (202 lb 13.2 oz)    Intake/Output: I/O last 3 completed shifts: In: 350 [Blood:350] Out: 1000 [Urine:500; Other:500]     Physical Exam: General: NAD, ill appearing  HEENT Moist mucus membranes  Neck supple  Pulm/lungs Clear ant and lat, slightly tachypceic  CVS/Heart Irregular, no rub  Abdomen:  Distender, + tender, bowel sounds present  Extremities: + edema  Neurologic: Alert, follows commands  Skin: No acute rashes  Access: AVG       Basic Metabolic Panel:  Recent Labs Lab 03/18/15 0504 03/20/15 1542 03/21/15 0518 03/22/15 0544  NA 136 136 136 141  K 3.8 3.4* 3.4* 3.9  CL 96* 91* 95* 99*  CO2 29 29 29  34*  GLUCOSE 137* 145* 113* 107*  BUN 45* 41* 45* 24*  CREATININE 7.62* 7.46* 7.79* 5.08*  CALCIUM 8.7* 8.6* 8.2* 8.4*     CBC:  Recent Labs Lab 03/18/15 0504 03/20/15 1542 03/21/15 0518 03/22/15 0544  WBC 9.9 11.8* 8.9 10.7*  NEUTROABS 8.9*  --   --   --   HGB 8.1* 7.5* 6.9* 8.1*  HCT 24.9* 23.2* 21.1* 24.9*  MCV 91.8 90.8 90.3 89.6  PLT 205 187 153 158      Microbiology: Results for orders placed or  performed during the hospital encounter of 03/20/15  Blood culture (routine x 2)     Status: None (Preliminary result)   Collection Time: 03/20/15  5:55 PM  Result Value Ref Range Status   Specimen Description BLOOD  Final   Special Requests Normal  Final   Culture NO GROWTH < 24 HOURS  Final   Report Status PENDING  Incomplete  Blood culture (routine x 2)     Status: None (Preliminary result)   Collection Time: 03/20/15  5:55 PM  Result Value Ref Range Status   Specimen Description BLOOD  Final   Special Requests Normal  Final   Culture NO GROWTH < 24 HOURS  Final   Report Status PENDING  Incomplete  MRSA PCR Screening     Status: Abnormal   Collection Time: 03/20/15  9:43 PM  Result Value Ref Range Status   MRSA by PCR POSITIVE (A) NEGATIVE Final    Comment:        The GeneXpert MRSA Assay (FDA approved for NASAL specimens only), is one component of a comprehensive MRSA colonization surveillance program. It is not intended to diagnose MRSA infection nor to guide or monitor treatment for MRSA infections. CRITICAL RESULT CALLED TO, READ BACK BY AND VERIFIED WITH: CALLED TO MEGAN OAKLEY AT 0059 03/21/15.TSH  Culture, expectorated sputum-assessment     Status: None   Collection Time: 03/20/15 11:01 PM  Result Value Ref Range Status   Specimen Description SPUTUM  Final   Special Requests Normal  Final   Sputum evaluation THIS SPECIMEN IS ACCEPTABLE FOR SPUTUM CULTURE  Final   Report Status 03/21/2015 FINAL  Final    Coagulation Studies: No results for input(s): LABPROT, INR in the last 72 hours.  Urinalysis:  Recent Labs  03/21/15 1043  COLORURINE YELLOW*  LABSPEC 1.012  PHURINE 7.0  GLUCOSEU NEGATIVE  HGBUR 2+*  BILIRUBINUR NEGATIVE  KETONESUR NEGATIVE  PROTEINUR >500*  NITRITE NEGATIVE  LEUKOCYTESUR 2+*      Imaging: Dg Chest Port 1 View  03/20/2015   CLINICAL DATA:  Short of breath.  Chest pain.  EXAM: PORTABLE CHEST - 1 VIEW  COMPARISON:  03/18/2015   FINDINGS: Cardiomegaly again seen. There is venous hypertension with mild interstitial edema. Areas of atelectasis in the lower lobes are again noted. The aorta is ectatic and unfolded.  IMPRESSION: Similar appearance of likely congestive heart failure. Cardiomegaly with mild interstitial edema. Atelectasis at the bases.   Electronically Signed   By: Nelson Chimes M.D.   On: 03/20/2015 16:22   Ct Angio Abd/pel W/ And/or W/o  03/21/2015   CLINICAL DATA:  Abdominal pain, nausea, possibly mesenteric ischemia. On dialysis.  EXAM: CT ANGIOGRAPHY ABDOMEN AND PELVIS  TECHNIQUE: Multidetector CT imaging of the abdomen and pelvis was performed using the standard protocol during bolus administration of intravenous contrast. Multiplanar reconstructed images including MIPs were obtained and reviewed to evaluate the vascular anatomy.  CONTRAST:  115mL OMNIPAQUE IOHEXOL 350 MG/ML SOLN  COMPARISON:  COMPARISON 03/18/2015  FINDINGS: ARTERIAL FINDINGS:  Coronary calcifications.  Aorta: The visualized distal descending thoracic segment is ectatic and tortuous measured up to 3.8 cm transverse diameter. There is moderate calcified plaque in the suprarenal segment which is ectatic dilated up to 4 cm axial diameter, stable by my measurement. Patent infrarenal bifurcated aortic stent graft. Large type 2 endoleak related to patent lumbar arteries and inferior mesenteric artery. Native aneurysm sac diameter 5.8 x 5.4 cm (previously 5.5 x 5.2).  Celiac axis: Mild short-segment ostial plaque without significant stenosis. Ectatic and widely patent distally.  Superior mesenteric: Scattered plaque, but diffusely ectatic, no dissection or stenosis.  Left renal:           Single, widely patent  Right renal:          Single, widely patent  Inferior mesenteric: Continued patency, associated with type 2 endoleak in the partially excluded aortic aneurysm sac.  Left iliac: Patent left limb of the stent graft extends to the mid external iliac artery.  There has been coil embolization of the internal iliac artery, with significant streak artifact. Tortuosity of the native distal external iliac artery. Distal native vessels are diffusely ectatic.  Right iliac: Right limb of the stent graft is patent, extending to the proximal external iliac. Coil embolization of the internal iliac artery with some resultant streak artifact. Mild tortuosity and atheromatous plaque involving the distal native external iliac artery without stenosis or dissection. There is a large aneurysm or pseudoaneurysm involving the distal common femoral artery measuring up to 6.5 cm maximum axial diameter, with ectasia of the visualized proximal SFA.  Venous findings: Patent hepatic veins, portal vein, superior mesenteric vein, splenic vein, bilateral renal veins, IVC. Streak artifact from internal iliac embolization coils limits assessment of the iliac venous system.  Review of the MIP  images confirms the above findings.  Nonvascular findings: Small pleural effusions right greater than left with associated atelectasis/ consolidation posteriorly in the lower lobes, increased since previous exam. Unremarkable liver, nondilated gallbladder, spleen, pancreas. Bilateral hydronephrosis, with left double-J ureteral stent in place. Stable bilateral adrenal nodules.  Stomach is nondilated. Several dilated mid abdominal small bowel loops without discrete transition point. The distal small bowel and colon remain decompressed. Scattered descending diverticula. Urinary bladder is incompletely distended, thick-walled. Right-sided bladder diverticulum. New pelvic ascites. No adenopathy localized.  IMPRESSION: 1. No significant proximal mesenteric arterial occlusive disease to suggest an etiology of occlusive mesenteric ischemia. 2. Dilatation of mid abdominal bowel loops, decompressed distally, without discrete transition point. 3. Patent infrarenal bifurcated stent graft with persistent type 2 endoleak and  slight enlargement native aneurysm sac, now 5.8 cm diameter. 4. 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 5. New pelvic ascites 6. Increasing Bilateral pleural effusions with worsening consolidation/atelectasis in the lung bases.   Electronically Signed   By: Lucrezia Europe M.D.   On: 03/21/2015 13:13     Medications:     . sodium chloride   Intravenous Once  . amiodarone  200 mg Oral Daily  . amLODipine  5 mg Oral Daily  . atorvastatin  40 mg Oral Daily  . budesonide-formoterol  2 puff Inhalation BID  . cefTAZidime (FORTAZ)  IV  1 g Intravenous Q24H  . Chlorhexidine Gluconate Cloth  6 each Topical Q0600  . feeding supplement (NEPRO CARB STEADY)  237 mL Oral BID BM  . heparin  5,000 Units Subcutaneous 3 times per day  . multivitamin  1 tablet Oral Daily  . mupirocin ointment  1 application Nasal BID  . pantoprazole  40 mg Oral Daily  . tiotropium  18 mcg Inhalation Daily  . vancomycin  1,000 mg Intravenous Once per day on Tue Thu Sat   acetaminophen **OR** acetaminophen, albuterol, alum & mag hydroxide-simeth, ondansetron **OR** ondansetron (ZOFRAN) IV, pneumococcal 23 valent vaccine  Assessment/ Plan:  74 y.o. 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 Next HD tomorrow  2. Anemia of chronic kidney disease:  EPO is avoided due to bladder cancer, prostate cancer hgb 8.1; blood transfusion given 6/27  3. Secondary Hyperparathyroidism: N25.81 - monitor phos  - Hold sevelamer  4.  Abdominal pain and  distention - recent CT concerning for AAA leak - vascular surgery eval pending  5. A. Fib with h/o RVR: continues on PO amiodarone.  6.  Pneumonia, likely bacteria- Empiric treatment with Vanc Zosyn  7. Urinary retention- I/O cath done 6/28. U/a and culture.   83 Hypotension - hold amlodipine   LOS: 2 Elfriede Bonini 6/29/20169:26 AM

## 2015-03-22 NOTE — Progress Notes (Signed)
PT Cancellation Note  Patient Details Name: [name] MRN: [id number] DOB: 1941/07/31   Cancelled Treatment:    Reason Eval/Treat Not Completed: Medical issues which prohibited therapy (Change in status, no continue PT order or new PT order). Pt transferred to CCU last night due to decline in status. No new orders upon transfer. No continue PT upon transfer. Current order will be completed. Please enter new order if PT evaluation is indicated.  Lyndel Safe Shanikia Kernodle PT, DPT   Nikole Swartzentruber 03/22/2015, 10:16 AM

## 2015-03-22 NOTE — Progress Notes (Signed)
Report called to Estill Bamberg, RN on 1C.

## 2015-03-22 NOTE — Progress Notes (Signed)
[place] [facility] [place] NAME: [name]    MR#:  [id number]  DATE OF BIRTH:  [birth year]  SUBJECTIVE:  Patient had an episode of hypotension after dialysis yesterday. Patient was transferred to stepdown unit. Patient's blood pressures improved. Patient continues to have some mild abdominal pain. Patient is tolerating his diet. He denies nausea or vomiting.  REVIEW OF SYSTEMS:    Review of Systems  Constitutional: Negative for fever, chills and malaise/fatigue.  HENT: Negative for sore throat.   Eyes: Negative for blurred vision.  Respiratory: Positive for shortness of breath. Negative for cough, hemoptysis and wheezing.   Cardiovascular: Negative for chest pain, palpitations and leg swelling.  Gastrointestinal: Positive for abdominal pain. Negative for nausea, vomiting, diarrhea, constipation, blood in stool and melena.  Genitourinary: Negative for dysuria.  Musculoskeletal: Negative for back pain.  Neurological: Negative for dizziness, tremors and headaches.  Endo/Heme/Allergies: Does not bruise/bleed easily.    Tolerating Diet: Yes    DRUG ALLERGIES:   Allergies  Allergen Reactions  . Penicillins Hives, Itching and Swelling    Whelps, itching, and swelling    VITALS:  Blood pressure 131/57, pulse 70, temperature 99.4 F (37.4 C), temperature source Axillary, resp. rate 23, height 6\' 2"  (1.88 m), weight 92 kg (202 lb 13.2 oz), SpO2 100 %.  PHYSICAL EXAMINATION:   Physical Exam  Constitutional: He is oriented to person, place, and time. No distress.  HENT:  Head: Normocephalic.  Eyes: No scleral icterus.  Neck: Normal range of motion. Neck supple. No JVD present. No tracheal deviation present.  Cardiovascular: Normal rate, regular rhythm and normal heart sounds.  Exam reveals no gallop and no friction rub.   No murmur heard. Pulmonary/Chest: Effort normal and breath sounds normal. No respiratory distress. He has  no wheezes. He has no rales. He exhibits no tenderness.  Abdominal: Soft. Bowel sounds are normal. He exhibits no distension and no mass. There is tenderness. There is no rebound and no guarding.  Musculoskeletal: Normal range of motion. He exhibits no edema.  Neurological: He is alert and oriented to person, place, and time.  Hoarse voice   Skin: Skin is warm. No rash noted. No erythema.  Psychiatric: Affect normal.      LABORATORY PANEL:   CBC  Recent Labs Lab 03/22/15 0544  WBC 10.7*  HGB 8.1*  HCT 24.9*  PLT 158   ------------------------------------------------------------------------------------------------------------------  Chemistries   Recent Labs Lab 03/18/15 0504  03/22/15 0544  NA 136  < > 141  K 3.8  < > 3.9  CL 96*  < > 99*  CO2 29  < > 34*  GLUCOSE 137*  < > 107*  BUN 45*  < > 24*  CREATININE 7.62*  < > 5.08*  CALCIUM 8.7*  < > 8.4*  AST 12*  --   --   ALT 6*  --   --   ALKPHOS 74  --   --   BILITOT 0.8  --   --   < > = values in this interval not displayed. ------------------------------------------------------------------------------------------------------------------  Cardiac Enzymes  Recent Labs Lab 03/20/15 2229 03/21/15 0228 03/21/15 0813  TROPONINI 0.03 0.11* 0.04*   ------------------------------------------------------------------------------------------------------------------  RADIOLOGY:  Dg Chest Port 1 View  03/20/2015   CLINICAL DATA:  Short of breath.  Chest pain.  EXAM: PORTABLE CHEST - 1 VIEW  COMPARISON:  03/18/2015  FINDINGS: Cardiomegaly again seen. There is venous hypertension with mild interstitial edema. Areas of  atelectasis in the lower lobes are again noted. The aorta is ectatic and unfolded.  IMPRESSION: Similar appearance of likely congestive heart failure. Cardiomegaly with mild interstitial edema. Atelectasis at the bases.   Electronically Signed   By: Nelson Chimes M.D.   On: 03/20/2015 16:22   Ct Angio  Abd/pel W/ And/or W/o .  IMPRESSION: 1. No significant proximal mesenteric arterial occlusive disease to suggest an etiology of occlusive mesenteric ischemia. 2. Dilatation of mid abdominal bowel loops, decompressed distally, without discrete transition point. 3. Patent infrarenal bifurcated stent graft with persistent type 2 endoleak and slight enlargement native aneurysm sac, now 5.8 cm diameter. 4. 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 5. New pelvic ascites 6. Increasing Bilateral pleural effusions with worsening consolidation/atelectasis in the lung bases.   Electronically Signed   By: Lucrezia Europe M.D.   On: 03/21/2015 13:13     ASSESSMENT AND PLAN:   74 year old male with end-stage renal disease on hemodialysis, congestive heart failure,. Paroxysmal atrial fibrillation, chronic anemia he was admitted for failed outpatient treatment for pneumonia.   1. Left lower lung  pneumonia: Patient was on Levaquin however failed therapy. Patient is currently on ceftaz  and vancomycin   2. Abdominal pain: CT of the abdomen did not show evidence of mesenteric ischemia. It does show dilation of mid abdominal bowel loops as well as a 5.8 cm aneurysm. I have consulted surgery and vascular surgery for further evaluation and assistance.   3. End-stage renal disease on hemodialysis: Patient will have dialysis on Tuesday, Thursday and Saturday.  4. acute on chronic anemia: Patient received 1 unit of PBC is well in dialysis. Patient's hemoglobin is stable.   5. 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   6. Paroxsmal atrial fibrillation: Patient is currently normal sinus rhythm.  Rate is controlled. Continue amiodarone  7. Hypertension: Patient had episode of hypotension after hemodialysis. Norvasc is on hold.  8. Hyperlipidemia: Patient will continue on Lipitor. 9. Hypotension: Patient had episode of hypotension after dialysis on  03/13/2015. Antibiotics were changed to ceftaz and vague lysin. I will continue these antibodies. I'll follow-up on final blood and urine culture. Patient's blood pressures improved.  He may be transferred to the floor.  PT evaluation as well as clinical social work consult has been placed.  Management plans discussed with the patient and he is in agreement.  CODE STATUS: FULL  TOTAL TIME TAKING CARE OF THIS PATIENT:  27 minutes.   Greater than 50% counseling and coordination of care  POSSIBLE D/C 2-3 days, DEPENDING ON CLINICAL CONDITION.   Pallavi Clifton M.D on 03/22/2015 at 10:56 AM  Between 7am to 6pm - Pager - [phone] After 6pm go to [email or web address] - password [email or web address] [facility]  Office  [phone]  CC: Primary care physician; Dion Body, MD

## 2015-03-23 ENCOUNTER — Ambulatory Visit: Payer: Medicare Other | Admitting: Hematology and Oncology

## 2015-03-23 ENCOUNTER — Inpatient Hospital Stay: Payer: Medicare Other

## 2015-03-23 LAB — CBC
HCT: 23.6 % — ABNORMAL LOW (ref 40.0–52.0)
HEMOGLOBIN: 7.9 g/dL — AB (ref 13.0–18.0)
MCH: 29.7 pg (ref 26.0–34.0)
MCHC: 33.4 g/dL (ref 32.0–36.0)
MCV: 89 fL (ref 80.0–100.0)
Platelets: 186 10*3/uL (ref 150–440)
RBC: 2.65 MIL/uL — ABNORMAL LOW (ref 4.40–5.90)
RDW: 18.3 % — ABNORMAL HIGH (ref 11.5–14.5)
WBC: 12.9 10*3/uL — AB (ref 3.8–10.6)

## 2015-03-23 LAB — BASIC METABOLIC PANEL
Anion gap: 10 (ref 5–15)
BUN: 32 mg/dL — AB (ref 6–20)
CALCIUM: 8.3 mg/dL — AB (ref 8.9–10.3)
CHLORIDE: 97 mmol/L — AB (ref 101–111)
CO2: 31 mmol/L (ref 22–32)
Creatinine, Ser: 5.96 mg/dL — ABNORMAL HIGH (ref 0.61–1.24)
GFR, EST AFRICAN AMERICAN: 10 mL/min — AB (ref >60)
GFR, EST NON AFRICAN AMERICAN: 8 mL/min — AB (ref >60)
GLUCOSE: 123 mg/dL — AB (ref 65–99)
Potassium: 3.6 mmol/L (ref 3.5–5.1)
SODIUM: 138 mmol/L (ref 135–145)

## 2015-03-23 LAB — AEROBIC ID BY MALDI

## 2015-03-23 LAB — CULTURE, RESPIRATORY: SPECIAL REQUESTS: NORMAL

## 2015-03-23 LAB — URINE CULTURE: Culture: 20000

## 2015-03-23 LAB — ORGANISM ID BY MALDI

## 2015-03-23 LAB — CULTURE, RESPIRATORY W GRAM STAIN: Culture: NORMAL

## 2015-03-23 LAB — OCCULT BLOOD X 1 CARD TO LAB, STOOL: FECAL OCCULT BLD: NEGATIVE

## 2015-03-23 MED ORDER — MORPHINE SULFATE 2 MG/ML IJ SOLN: 2.0000 mg | INTRAMUSCULAR | Status: DC | PRN

## 2015-03-23 NOTE — Plan of Care (Signed)
Problem: Discharge Progression Outcomes Goal: Vaccine documented on D/C instructions Pt is alert and oriented x 4, hoarse voice, needs assist with movement in bed, dialysis performed in am, pt c/o abdominal pain, abd xray showed ilieus, NG tube placed for decompression, pain improved, family at bedside, receiving IV antibiotics. Afebrile throughout shift, vital signs stable, stool negative for occult blood, wbc trending upwards, continues on 2 l oxygen.

## 2015-03-23 NOTE — Progress Notes (Signed)
Subjective:   Patient presents with cough and sputum He is admitted for management of pneumonia He reports poor appetite and feeling bad overall Continues to have abdominal distension  Patient seen during dialysis Tolerating well    Objective:  Vital signs in last 24 hours:  Temp:  [97.7 F (36.5 C)-98 F (36.7 C)] 97.7 F (36.5 C) (06/30 0542) Pulse Rate:  [75-80] 80 (06/30 1000) Resp:  [18-28] 28 (06/30 1000) BP: (90-119)/(52-69) 119/67 mmHg (06/30 1000) SpO2:  [97 %-100 %] 100 % (06/30 1000)  Weight change:  Filed Weights   03/20/15 2257 03/21/15 0942 03/21/15 1751  Weight: 92.488 kg (203 lb 14.4 oz) 92.4 kg (203 lb 11.3 oz) 92 kg (202 lb 13.2 oz)    Intake/Output: I/O last 3 completed shifts: In: 240 [P.O.:240] Out: 2 [Stool:2]     Physical Exam: General: NAD, ill appearing  HEENT Moist mucus membranes  Neck supple  Pulm/lungs Clear ant and lat, slightly tachypceic  CVS/Heart Irregular, no rub  Abdomen:  Distender, + tender, bowel sounds present  Extremities: + edema  Neurologic: Alert, follows commands  Skin: No acute rashes  Access: AVG       Basic Metabolic Panel:  Recent Labs Lab 03/18/15 0504 03/20/15 1542 03/21/15 0518 03/22/15 0544 03/23/15 0500  NA 136 136 136 141 138  K 3.8 3.4* 3.4* 3.9 3.6  CL 96* 91* 95* 99* 97*  CO2 29 29 29  34* 31  GLUCOSE 137* 145* 113* 107* 123*  BUN 45* 41* 45* 24* 32*  CREATININE 7.62* 7.46* 7.79* 5.08* 5.96*  CALCIUM 8.7* 8.6* 8.2* 8.4* 8.3*     CBC:  Recent Labs Lab 03/18/15 0504 03/20/15 1542 03/21/15 0518 03/22/15 0544 03/23/15 0500  WBC 9.9 11.8* 8.9 10.7* 12.9*  NEUTROABS 8.9*  --   --   --   --   HGB 8.1* 7.5* 6.9* 8.1* 7.9*  HCT 24.9* 23.2* 21.1* 24.9* 23.6*  MCV 91.8 90.8 90.3 89.6 89.0  PLT 205 187 153 158 186      Microbiology: Results for orders placed or performed during the hospital encounter of 03/20/15  Blood culture (routine x 2)     Status: None (Preliminary result)   Collection Time: 03/20/15  5:55 PM  Result Value Ref Range Status   Specimen Description BLOOD  Final   Special Requests Normal  Final   Culture NO GROWTH 2 DAYS  Final   Report Status PENDING  Incomplete  Blood culture (routine x 2)     Status: None (Preliminary result)   Collection Time: 03/20/15  5:55 PM  Result Value Ref Range Status   Specimen Description BLOOD  Final   Special Requests Normal  Final   Culture NO GROWTH 2 DAYS  Final   Report Status PENDING  Incomplete  MRSA PCR Screening     Status: Abnormal   Collection Time: 03/20/15  9:43 PM  Result Value Ref Range Status   MRSA by PCR POSITIVE (A) NEGATIVE Final    Comment:        The GeneXpert MRSA Assay (FDA approved for NASAL specimens only), is one component of a comprehensive MRSA colonization surveillance program. It is not intended to diagnose MRSA infection nor to guide or monitor treatment for MRSA infections. CRITICAL RESULT CALLED TO, READ BACK BY AND VERIFIED WITH: CALLED TO MEGAN OAKLEY AT 0059 03/21/15.TSH   Culture, expectorated sputum-assessment     Status: None   Collection Time: 03/20/15 11:01 PM  Result Value Ref Range  Status   Specimen Description SPUTUM  Final   Special Requests Normal  Final   Sputum evaluation THIS SPECIMEN IS ACCEPTABLE FOR SPUTUM CULTURE  Final   Report Status 03/21/2015 FINAL  Final  Culture, respiratory (NON-Expectorated)     Status: None (Preliminary result)   Collection Time: 03/20/15 11:01 PM  Result Value Ref Range Status   Specimen Description SPUTUM  Final   Special Requests Normal Reflexed from [id number]  Final   Gram Stain PENDING  Incomplete   Culture APPEARS TO BE NORMAL FLORA  Final   Report Status PENDING  Incomplete  Urine culture     Status: None (Preliminary result)   Collection Time: 03/21/15 10:43 AM  Result Value Ref Range Status   Specimen Description URINE, RANDOM  Final   Special Requests NONE  Final   Culture TOO YOUNG TO READ  Final   Report  Status PENDING  Incomplete  Culture, blood (routine x 2)     Status: None (Preliminary result)   Collection Time: 03/21/15  3:30 PM  Result Value Ref Range Status   Specimen Description BLOOD  Final   Special Requests NONE  Final   Culture NO GROWTH < 24 HOURS  Final   Report Status PENDING  Incomplete  Culture, blood (routine x 2)     Status: None (Preliminary result)   Collection Time: 03/21/15  3:54 PM  Result Value Ref Range Status   Specimen Description BLOOD  Final   Special Requests NONE  Final   Culture NO GROWTH < 24 HOURS  Final   Report Status PENDING  Incomplete    Coagulation Studies: No results for input(s): LABPROT, INR in the last 72 hours.  Urinalysis:  Recent Labs  03/21/15 1043  COLORURINE YELLOW*  LABSPEC 1.012  PHURINE 7.0  GLUCOSEU NEGATIVE  HGBUR 2+*  BILIRUBINUR NEGATIVE  KETONESUR NEGATIVE  PROTEINUR >500*  NITRITE NEGATIVE  LEUKOCYTESUR 2+*      Imaging: Dg Abd 1 View  03/23/2015   CLINICAL DATA:  Ileus  EXAM: ABDOMEN - 1 VIEW  COMPARISON:  Abdominal radiograph Feb 02, 2015; CT abdomen and pelvis March 21, 2015  FINDINGS: There is a persistent double-J stent on the left from the level of L2-3 to the bladder. There remain loops of dilated bowel in a pattern suggestive of ileus. No well-defined obstruction is seen. There is air in the rectum. There is a stent in the abdominal aorta and iliac arteries with evidence of native abdominal aortic aneurysm. There are coils in the regions of each hypogastric artery in the pelvis. 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  IMPRESSION: Bowel gas pattern appearance is consistent with persistent ileus. No free air seen. Aorto bi-iliac stent with native abdominal aortic aneurysm apparent. Double-J stent present on left.   Electronically Signed   By: Lowella Grip III M.D.   On: 03/23/2015 08:48   Ct Angio Abd/pel W/ And/or W/o  03/21/2015   CLINICAL DATA:  Abdominal pain, nausea, possibly  mesenteric ischemia. On dialysis.  EXAM: CT ANGIOGRAPHY ABDOMEN AND PELVIS  TECHNIQUE: Multidetector CT imaging of the abdomen and pelvis was performed using the standard protocol during bolus administration of intravenous contrast. Multiplanar reconstructed images including MIPs were obtained and reviewed to evaluate the vascular anatomy.  CONTRAST:  133mL OMNIPAQUE IOHEXOL 350 MG/ML SOLN  COMPARISON:  COMPARISON 03/18/2015  FINDINGS: ARTERIAL FINDINGS:  Coronary calcifications.  Aorta: The visualized distal descending thoracic segment is ectatic and tortuous measured up to  3.8 cm transverse diameter. There is moderate calcified plaque in the suprarenal segment which is ectatic dilated up to 4 cm axial diameter, stable by my measurement. Patent infrarenal bifurcated aortic stent graft. Large type 2 endoleak related to patent lumbar arteries and inferior mesenteric artery. Native aneurysm sac diameter 5.8 x 5.4 cm (previously 5.5 x 5.2).  Celiac axis: Mild short-segment ostial plaque without significant stenosis. Ectatic and widely patent distally.  Superior mesenteric: Scattered plaque, but diffusely ectatic, no dissection or stenosis.  Left renal:           Single, widely patent  Right renal:          Single, widely patent  Inferior mesenteric: Continued patency, associated with type 2 endoleak in the partially excluded aortic aneurysm sac.  Left iliac: Patent left limb of the stent graft extends to the mid external iliac artery. There has been coil embolization of the internal iliac artery, with significant streak artifact. Tortuosity of the native distal external iliac artery. Distal native vessels are diffusely ectatic.  Right iliac: Right limb of the stent graft is patent, extending to the proximal external iliac. Coil embolization of the internal iliac artery with some resultant streak artifact. Mild tortuosity and atheromatous plaque involving the distal native external iliac artery without stenosis or  dissection. There is a large aneurysm or pseudoaneurysm involving the distal common femoral artery measuring up to 6.5 cm maximum axial diameter, with ectasia of the visualized proximal SFA.  Venous findings: Patent hepatic veins, portal vein, superior mesenteric vein, splenic vein, bilateral renal veins, IVC. Streak artifact from internal iliac embolization coils limits assessment of the iliac venous system.  Review of the MIP images confirms the above findings.  Nonvascular findings: Small pleural effusions right greater than left with associated atelectasis/ consolidation posteriorly in the lower lobes, increased since previous exam. Unremarkable liver, nondilated gallbladder, spleen, pancreas. Bilateral hydronephrosis, with left double-J ureteral stent in place. Stable bilateral adrenal nodules.  Stomach is nondilated. Several dilated mid abdominal small bowel loops without discrete transition point. The distal small bowel and colon remain decompressed. Scattered descending diverticula. Urinary bladder is incompletely distended, thick-walled. Right-sided bladder diverticulum. New pelvic ascites. No adenopathy localized.  IMPRESSION: 1. No significant proximal mesenteric arterial occlusive disease to suggest an etiology of occlusive mesenteric ischemia. 2. Dilatation of mid abdominal bowel loops, decompressed distally, without discrete transition point. 3. Patent infrarenal bifurcated stent graft with persistent type 2 endoleak and slight enlargement native aneurysm sac, now 5.8 cm diameter. 4. 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 5. New pelvic ascites 6. Increasing Bilateral pleural effusions with worsening consolidation/atelectasis in the lung bases.   Electronically Signed   By: Lucrezia Europe M.D.   On: 03/21/2015 13:13     Medications:     . sodium chloride   Intravenous Once  . amiodarone  200 mg Oral Daily  . atorvastatin  40 mg Oral Daily  .  budesonide-formoterol  2 puff Inhalation BID  . cefTAZidime (FORTAZ)  IV  1 g Intravenous Q24H  . Chlorhexidine Gluconate Cloth  6 each Topical Q0600  . feeding supplement (NEPRO CARB STEADY)  237 mL Oral BID BM  . heparin  5,000 Units Subcutaneous 3 times per day  . multivitamin  1 tablet Oral Daily  . mupirocin ointment  1 application Nasal BID  . pantoprazole  40 mg Oral Daily  . tiotropium  18 mcg Inhalation Daily  . vancomycin  1,000 mg Intravenous Once per  day on Tue Thu Sat   acetaminophen **OR** acetaminophen, albuterol, alum & mag hydroxide-simeth, ondansetron **OR** ondansetron (ZOFRAN) IV, pneumococcal 23 valent vaccine  Assessment/ Plan:  75 y.o. 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 Patient seen during dialysis Tolerating well   2. Anemia of chronic kidney disease:  EPO is avoided due to bladder cancer, prostate cancer hgb 7.9; blood transfusion given 6/27  3. Secondary Hyperparathyroidism: N25.81 - monitor phos  - Hold sevelamer  4.  Abdominal pain and distention - recent CT concerning for AAA leak - vascular surgery eval pending  5. A. Fib with h/o RVR: continues on PO amiodarone.  6.  Pneumonia, likely bacteria- Empiric treatment with Vanc Zosyn  7. Urinary retention- I/O cath done 6/28.  Urine culture report pending  8. Hypotension - hold amlodipine   LOS: 3 Serai Tukes 6/30/201610:17 AM

## 2015-03-23 NOTE — Progress Notes (Signed)
PT Cancellation Note  Patient Details Name: SULLIVAN JACUINDE MRN: [id number] DOB: 08-13-1941   Cancelled Treatment:    Reason Eval/Treat Not Completed: Patient at procedure or test/unavailable. Attempted evaluation for second time at 13:30. Pt still out of room at HD. Will attempt evaluation on later date as patient is available.   Honor Frison 03/23/2015, 2:24 PM

## 2015-03-23 NOTE — Progress Notes (Signed)
PRE HD   

## 2015-03-23 NOTE — Plan of Care (Signed)
Problem: Discharge Progression Outcomes Goal: Discharge plan in place and appropriate Outcome: Progressing Patient is from home, lives with a friend. Recently been in ED diagnosed with pneumonia, returned with same diagnoses.   Patient has extensive history--PAF, CHF, ESRD, GERD, anemia, history of bladder and prostate cancer. Continue with home medications Goal: Other Discharge Outcomes/Goals Outcome: Progressing Pt alert and oriented, remains afebrile, on 2L O2 sats @98 %. Abdomen firm and distended, pt complains of mild discomfort due to this, repositioning helped ease this. Continues on IV abx, will call out and voice any needs. Will continue to monitor.

## 2015-03-23 NOTE — Progress Notes (Signed)
[facility] [place] NAME: [name]    MR#:  [id number]  DATE OF BIRTH:  [birth year]  SUBJECTIVE:  Patient still having some abdominal pain however is tolerating his diet. REVIEW OF SYSTEMS:    Review of Systems  Constitutional: Negative for fever, chills and malaise/fatigue.  HENT: Negative for sore throat.   Eyes: Negative for blurred vision.  Respiratory: Positive for shortness of breath. Negative for cough, hemoptysis and wheezing.   Cardiovascular: Negative for chest pain, palpitations and leg swelling.  Gastrointestinal: Positive for abdominal pain. Negative for nausea, vomiting, diarrhea, constipation, blood in stool and melena.  Genitourinary: Negative for dysuria.  Musculoskeletal: Negative for back pain.  Neurological: Negative for dizziness, tremors and headaches.  Endo/Heme/Allergies: Does not bruise/bleed easily.    Tolerating Diet: Yes    DRUG ALLERGIES:   Allergies  Allergen Reactions  . Penicillins Hives, Itching and Swelling    Whelps, itching, and swelling    VITALS:  Blood pressure 108/68, pulse 76, temperature 98.7 F (37.1 C), temperature source Axillary, resp. rate 21, height 6\' 2"  (1.88 m), weight 92 kg (202 lb 13.2 oz), SpO2 100 %.  PHYSICAL EXAMINATION:   Physical Exam  Constitutional: He is oriented to person, place, and time. No distress.  HENT:  Head: Normocephalic.  Eyes: No scleral icterus.  Neck: Normal range of motion. Neck supple. No JVD present. No tracheal deviation present.  Cardiovascular: Normal rate, regular rhythm and normal heart sounds.  Exam reveals no gallop and no friction rub.   No murmur heard. Pulmonary/Chest: Effort normal and breath sounds normal. No respiratory distress. He has no wheezes. He has no rales. He exhibits no tenderness.  Abdominal: Soft. He exhibits no distension and no mass. There is tenderness. There is no rebound and no guarding.   Musculoskeletal: Normal range of motion. He exhibits no edema.  Neurological: He is alert and oriented to person, place, and time.  Hoarse voice   Skin: Skin is warm. No rash noted. No erythema.  Psychiatric: Affect normal.      LABORATORY PANEL:   CBC  Recent Labs Lab 03/23/15 0500  WBC 12.9*  HGB 7.9*  HCT 23.6*  PLT 186   ------------------------------------------------------------------------------------------------------------------  Chemistries   Recent Labs Lab 03/18/15 0504  03/23/15 0500  NA 136  < > 138  K 3.8  < > 3.6  CL 96*  < > 97*  CO2 29  < > 31  GLUCOSE 137*  < > 123*  BUN 45*  < > 32*  CREATININE 7.62*  < > 5.96*  CALCIUM 8.7*  < > 8.3*  AST 12*  --   --   ALT 6*  --   --   ALKPHOS 74  --   --   BILITOT 0.8  --   --   < > = values in this interval not displayed. ------------------------------------------------------------------------------------------------------------------  Cardiac Enzymes  Recent Labs Lab 03/20/15 2229 03/21/15 0228 03/21/15 0813  TROPONINI 0.03 0.11* 0.04*   ------------------------------------------------------------------------------------------------------------------  RADIOLOGY:  Dg Chest Port 1 View  03/20/2015   CLINICAL DATA:  Short of breath.  Chest pain.  EXAM: PORTABLE CHEST - 1 VIEW  COMPARISON:  03/18/2015  FINDINGS: Cardiomegaly again seen. There is venous hypertension with mild interstitial edema. Areas of atelectasis in the lower lobes are again noted. The aorta is ectatic and unfolded.  IMPRESSION: Similar appearance of likely congestive heart failure. Cardiomegaly with mild interstitial edema. Atelectasis at the  bases.   Electronically Signed   By: Nelson Chimes M.D.   On: 03/20/2015 16:22   Ct Angio Abd/pel W/ And/or W/o .  IMPRESSION: 1. No significant proximal mesenteric arterial occlusive disease to suggest an etiology of occlusive mesenteric ischemia. 2. Dilatation of mid abdominal bowel loops,  decompressed distally, without discrete transition point. 3. Patent infrarenal bifurcated stent graft with persistent type 2 endoleak and slight enlargement native aneurysm sac, now 5.8 cm diameter. 4. 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 5. New pelvic ascites 6. Increasing Bilateral pleural effusions with worsening consolidation/atelectasis in the lung bases.   Electronically Signed   By: Lucrezia Europe M.D.   On: 03/21/2015 13:13     ASSESSMENT AND PLAN:   74 year old male with end-stage renal disease on hemodialysis, congestive heart failure,. Paroxysmal atrial fibrillation, chronic anemia he was admitted for failed outpatient treatment for pneumonia.   1. Left lower lung  pneumonia: Patient was on Levaquin however failed therapy. Patient is currently on ceftaz  and vancomycin  Sputum cultures growing out gram-positive cocci in clusters as well as toxo bacilli. Blood cultures negative.   2. Abdominal pain: CT of the abdomen did not show evidence of mesenteric ischemia. It does show dilation of mid abdominal bowel loops as well as a 5.8 cm aneurysm. patient has evidence of an ileus. Patient will have an NG tube placed.  I have spoken with vascular surgery about the endoleak as well as pseudo-aneurysm seen on CT scan. At this time no intervention is recommended. Patient will need a follow-up with vascular surgery after  Discharge.   3. End-stage renal disease on hemodialysis: Patient will have dialysis on Tuesday, Thursday and Saturday.  4. acute on chronic anemia: Patient received 1 unit of PBC is well in dialysis. Patient's hemoglobin is stable.   5. 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   6. Paroxsmal atrial fibrillation: Patient is currently normal sinus rhythm.  Rate is controlled. Continue amiodarone  7. Hypertension: Patient had episode of hypotension after hemodialysis. Norvasc is on hold.  8.  Hyperlipidemia: Patient will continue on Lipitor. 9. Hypotension: Patient had episode of hypotension after dialysis on 03/13/2015. Antibiotics were changed to ceftaz and vanc. I will continue these. . Patient's blood pressures improved.  it also appears the patient has a urinary tract infection. Patient is on antibiotics as mentioned above. Urine culture final report is pending.   PT evaluation as well as clinical social work consult has been placed.  Management plans discussed with the patient and he is in agreement.  CODE STATUS: FULL  TOTAL TIME TAKING CARE OF THIS PATIENT:  25 minutes.   Greater than 50% counseling and coordination of care  POSSIBLE D/C 2-3 days, DEPENDING ON CLINICAL CONDITION.   Parilee Hally M.D on 03/23/2015 at 11:33 AM  Between 7am to 6pm - Pager - [phone] After 6pm go to [email or web address] - password [email or web address]  Office  [phone]  CC: Primary care physician; Dion Body, MD

## 2015-03-23 NOTE — Care Management (Signed)
IM placed in room, Mr Timberman in dialysis

## 2015-03-23 NOTE — Progress Notes (Signed)
Notify MD that NG tube has been placed, confirm with MD twhat type of suction and what type of diet. Low continuous suctioning, NPO except Ice. Dr. Benjie Karvonen via telephone

## 2015-03-23 NOTE — Progress Notes (Signed)
HD START 

## 2015-03-23 NOTE — Progress Notes (Signed)
PT Attempt Note  Patient Details Name: [name] MRN: [id number] DOB: [birth year]   Cancelled Treatment:    Reason Eval/Treat Not Completed: Patient at procedure or test/unavailable. Chart reviewed and RN consulted. Pt currently out of room for hemodialysis. Will attempt evaluation at later time/date as pt is available and appropriate.  Lyndel Safe Alton Bouknight PT, DPT   Pavel Gadd 03/23/2015, 10:44 AM

## 2015-03-24 ENCOUNTER — Inpatient Hospital Stay: Payer: Medicare Other

## 2015-03-24 ENCOUNTER — Ambulatory Visit: Payer: Medicare Other | Admitting: Hematology and Oncology

## 2015-03-24 DIAGNOSIS — K567 Ileus, unspecified: Secondary | ICD-10-CM

## 2015-03-24 LAB — BASIC METABOLIC PANEL
ANION GAP: 11 (ref 5–15)
BUN: 19 mg/dL (ref 6–20)
CHLORIDE: 99 mmol/L — AB (ref 101–111)
CO2: 32 mmol/L (ref 22–32)
Calcium: 8.3 mg/dL — ABNORMAL LOW (ref 8.9–10.3)
Creatinine, Ser: 4.34 mg/dL — ABNORMAL HIGH (ref 0.61–1.24)
GFR, EST AFRICAN AMERICAN: 14 mL/min — AB (ref >60)
GFR, EST NON AFRICAN AMERICAN: 12 mL/min — AB (ref >60)
GLUCOSE: 110 mg/dL — AB (ref 65–99)
POTASSIUM: 3.6 mmol/L (ref 3.5–5.1)
Sodium: 142 mmol/L (ref 135–145)

## 2015-03-24 LAB — CBC
HCT: 23.5 % — ABNORMAL LOW (ref 40.0–52.0)
Hemoglobin: 7.8 g/dL — ABNORMAL LOW (ref 13.0–18.0)
MCH: 29.3 pg (ref 26.0–34.0)
MCHC: 32.9 g/dL (ref 32.0–36.0)
MCV: 89 fL (ref 80.0–100.0)
Platelets: 212 10*3/uL (ref 150–440)
RBC: 2.64 MIL/uL — ABNORMAL LOW (ref 4.40–5.90)
RDW: 17.9 % — ABNORMAL HIGH (ref 11.5–14.5)
WBC: 13.5 10*3/uL — AB (ref 3.8–10.6)

## 2015-03-24 MED ORDER — LEVOFLOXACIN IN D5W 250 MG/50ML IV SOLN
250.0000 mg | INTRAVENOUS | Status: DC
  Administered 2015-03-24 – 2015-03-26 (×2): 250 mg via INTRAVENOUS
  Filled 2015-03-24 (×3): qty 50

## 2015-03-24 MED ORDER — PANTOPRAZOLE SODIUM 40 MG IV SOLR
40.0000 mg | Freq: Two times a day (BID) | INTRAVENOUS | Status: DC
  Administered 2015-03-24 – 2015-03-27 (×7): 40 mg via INTRAVENOUS
  Filled 2015-03-24 (×7): qty 40

## 2015-03-24 MED ORDER — PANTOPRAZOLE SODIUM 40 MG PO PACK: 40.0000 mg | PACK | Freq: Every day | ORAL | Status: DC

## 2015-03-24 MED ORDER — VANCOMYCIN HCL IN DEXTROSE 1-5 GM/200ML-% IV SOLN
1000.0000 mg | INTRAVENOUS | Status: DC
  Filled 2015-03-24: qty 200

## 2015-03-24 MED ORDER — LEVOFLOXACIN IN D5W 250 MG/50ML IV SOLN: 250.0000 mg | INTRAVENOUS | Status: DC

## 2015-03-24 NOTE — Progress Notes (Signed)
ANTIBIOTIC CONSULT NOTE -follow up  Pharmacy Consult for Vancomycin, Ceftazidime  Indication: pneumonia  Allergies  Allergen Reactions  . Penicillins Hives, Itching and Swelling    Whelps, itching, and swelling    Patient Measurements: Height: 6\' 2"  (188 cm) Weight: 206 lb 2.1 oz (93.5 kg) IBW/kg (Calculated) : 82.2 Adjusted Body Weight: 88 kg  Vital Signs: Temp: 97.8 F (36.6 C) (07/01 1103) Temp Source: Oral (07/01 1103) BP: 120/59 mmHg (07/01 1103) Pulse Rate: 76 (07/01 1103) Intake/Output from previous day: 06/30 0701 - 07/01 0700 In: 50 [IV Piggyback:50] Out: 850 [Emesis/NG output:350] Intake/Output from this shift:    Labs:  Recent Labs  03/22/15 0544 03/23/15 0500 03/24/15 0502  WBC 10.7* 12.9* 13.5*  HGB 8.1* 7.9* 7.8*  PLT 158 186 212  CREATININE 5.08* 5.96* 4.34*   Estimated Creatinine Clearance: 17.6 mL/min (by C-G formula based on Cr of 4.34). HEMODIALYSIS pt Microbiology: Recent Results (from the past 720 hour(s))  CULTURE, URINE COMPREHENSIVE     Status: None   Collection Time: 02/27/15  9:30 AM  Result Value Ref Range Status   Urine Culture, Comprehensive Final report  Final   Result 1 Comment  Final    Comment: No growth in 36 - 48 hours.  Microscopic Examination     Status: Abnormal   Collection Time: 02/27/15  9:30 AM  Result Value Ref Range Status   WBC, UA 11-30 (A) 0 -  5 /hpf Final   RBC, UA >30 (A) 0 -  2 /hpf Final   Epithelial Cells (non renal) 0-10 0 - 10 /hpf Final   Bacteria, UA Few None seen/Few Final  Microscopic Examination     Status: Abnormal   Collection Time: 03/16/15  2:02 PM  Result Value Ref Range Status   WBC, UA >30 (A) 0 -  5 /hpf Final   RBC, UA 3-10 (A) 0 -  2 /hpf Final   Epithelial Cells (non renal) 0-10 0 - 10 /hpf Final   Bacteria, UA Many (A) None seen/Few Final  CULTURE, URINE COMPREHENSIVE     Status: None   Collection Time: 03/16/15  2:30 PM  Result Value Ref Range Status   Urine Culture,  Comprehensive Final report  Final   Result 1 Comment  Final    Comment: Diphtheroids, not Corynebacterium urealyticum 50,000-100,000 colony forming units per mL Susceptibility not normally performed on this organism.   Organism ID by MALDI     Status: None   Collection Time: 03/16/15  2:30 PM  Result Value Ref Range Status   Organism ID by MALDI Comment  Final    Comment: Specimen has been received. Testing has been initiated.  Aerobic ID by MALDI     Status: None   Collection Time: 03/16/15  2:30 PM  Result Value Ref Range Status   Aerobic ID by MALDI Comment  Final    Comment: Corynebacterium striatum MALDI TOF analysis was performed using FDA cleared reagents and VITEK MS from [employer]. The database used for the identification of this organism was from a research use only (RUO) database the characteristics of this which were determined by [facility]. This test is used for clinical purposes and should not be regarded as investigational or for research use.   Blood culture (routine x 2)     Status: None (Preliminary result)   Collection Time: 03/20/15  5:55 PM  Result Value Ref Range Status   Specimen Description BLOOD  Final   Special Requests Normal  Final  Culture NO GROWTH 2 DAYS  Final   Report Status PENDING  Incomplete  Blood culture (routine x 2)     Status: None (Preliminary result)   Collection Time: 03/20/15  5:55 PM  Result Value Ref Range Status   Specimen Description BLOOD  Final   Special Requests Normal  Final   Culture NO GROWTH 2 DAYS  Final   Report Status PENDING  Incomplete  MRSA PCR Screening     Status: Abnormal   Collection Time: 03/20/15  9:43 PM  Result Value Ref Range Status   MRSA by PCR POSITIVE (A) NEGATIVE Final    Comment:        The GeneXpert MRSA Assay (FDA approved for NASAL specimens only), is one component of a comprehensive MRSA colonization surveillance program. It is not intended to diagnose MRSA infection nor to guide  or monitor treatment for MRSA infections. CRITICAL RESULT CALLED TO, READ BACK BY AND VERIFIED WITH: CALLED TO MEGAN OAKLEY AT 0059 03/21/15.TSH   Culture, expectorated sputum-assessment     Status: None   Collection Time: 03/20/15 11:01 PM  Result Value Ref Range Status   Specimen Description SPUTUM  Final   Special Requests Normal  Final   Sputum evaluation THIS SPECIMEN IS ACCEPTABLE FOR SPUTUM CULTURE  Final   Report Status 03/21/2015 FINAL  Final  Culture, respiratory (NON-Expectorated)     Status: None   Collection Time: 03/20/15 11:01 PM  Result Value Ref Range Status   Specimen Description SPUTUM  Final   Special Requests Normal Reflexed from [id number]  Final   Gram Stain   Final    MANY WBC SEEN FEW GRAM POSITIVE COCCI IN CLUSTERS FEW GRAM NEGATIVE COCCOBACILLI EXCELLENT SPECIMEN - 90-100% WBCS    Culture APPEARS TO BE NORMAL FLORA  Final   Report Status 03/23/2015 FINAL  Final  Urine culture     Status: None   Collection Time: 03/21/15 10:43 AM  Result Value Ref Range Status   Specimen Description URINE, RANDOM  Final   Special Requests NONE  Final   Culture   Final    20,000 COLONIES/mL GRAM POSITIVE RODS NO FURTHER ID    Report Status 03/23/2015 FINAL  Final  Culture, blood (routine x 2)     Status: None (Preliminary result)   Collection Time: 03/21/15  3:30 PM  Result Value Ref Range Status   Specimen Description BLOOD  Final   Special Requests NONE  Final   Culture NO GROWTH < 24 HOURS  Final   Report Status PENDING  Incomplete  Culture, blood (routine x 2)     Status: None (Preliminary result)   Collection Time: 03/21/15  3:54 PM  Result Value Ref Range Status   Specimen Description BLOOD  Final   Special Requests NONE  Final   Culture NO GROWTH < 24 HOURS  Final   Report Status PENDING  Incomplete    Medical History: Past Medical History  Diagnosis Date  . PAF (paroxysmal atrial fibrillation)     a. new onset 12/2014 in the setting of UTI, sepsis,  hypotension, and anemia; b. not on long term anticoagulation given anemia; c. family aware of stroke risk, they are ok with this; d. on amiodarone   . Chronic combined systolic and diastolic CHF (congestive heart failure)     a. echo 12/2014: EF 25-30%, anterior wall wall motion abnormalities; b. planned ischemic evaluation once patient is stable medically; c. echo 01/2015: EF 45-50%, no RWMA, mild AT, LA  mildly dilated, mod pericardial effusion along LV free wall, no evidence of hemodynamic compromise  . ESRD on hemodialysis     a. Tuesday, Thursday, and Saturdays  . Anemia     a. baseline hgb ~ 8  . History of small bowel obstruction     a. 01/2015  . History of septic shock     a. 01/2015  . GERD (gastroesophageal reflux disease)   . Allergic rhinitis   . COPD (chronic obstructive pulmonary disease)   . Hypercholesteremia   . Cognitive communication deficit   . Magnesium deficiency   . Dementia   . Chronic indwelling Foley catheter   . Iliac aneurysm   . Incontinence   . Hydronephrosis   . Overactive bladder   . Detrusor sphincter dyssynergia   . Mini stroke   . Bladder cancer   . Prostate cancer   . Renal insufficiency   . Hypertension     Medications:  Prescriptions prior to admission  Medication Sig Dispense Refill Last Dose  . albuterol (PROVENTIL) (2.5 MG/3ML) 0.083% nebulizer solution Take 2.5 mg by nebulization every 6 (six) hours as needed for wheezing or shortness of breath.   03/20/2015 at Unknown time  . amiodarone (PACERONE) 200 MG tablet Take 1 tablet (200 mg total) by mouth daily. 30 tablet 5 03/20/2015 at Unknown time  . amLODipine (NORVASC) 5 MG tablet Take 5 mg by mouth daily.    03/20/2015 at Unknown time  . atorvastatin (LIPITOR) 40 MG tablet Take 1 tablet (40 mg total) by mouth daily. 30 tablet 1 03/20/2015 at Unknown time  . budesonide-formoterol (SYMBICORT) 160-4.5 MCG/ACT inhaler Inhale 2 puffs into the lungs 2 (two) times daily.   03/20/2015 at Unknown time  .  levofloxacin (LEVAQUIN) 500 MG tablet Take 1 tablet (500 mg total) by mouth every other day. 2 tablet 0 03/20/2015 at Unknown time  . magnesium oxide (MAG-OX) 400 MG tablet Take 400 mg by mouth at bedtime.   03/19/2015 at Unknown time  . multivitamin (RENA-VIT) TABS tablet Take 1 tablet by mouth daily.   03/20/2015 at Unknown time  . pantoprazole (PROTONIX) 40 MG tablet Take 1 tablet (40 mg total) by mouth daily. 30 tablet 0 03/20/2015 at Unknown time  . sevelamer carbonate (RENVELA) 800 MG tablet Take 800 mg by mouth 3 (three) times daily with meals.    03/20/2015 at Unknown time  . Tiotropium Bromide Monohydrate 2.5 MCG/ACT AERS Inhale 1 puff into the lungs daily.   03/20/2015 at Unknown time  . albuterol (PROVENTIL HFA;VENTOLIN HFA) 108 (90 BASE) MCG/ACT inhaler Inhale 2 puffs into the lungs every 4 (four) hours as needed for wheezing or shortness of breath (Every 4 to 6 hours).   Completed Course at Unknown time  . chlorpheniramine (CHLOR-TRIMETON) 4 MG tablet Take 4 mg by mouth every 4 (four) hours as needed for allergies.   Completed Course at Unknown time  . guaifenesin (ROBITUSSIN) 100 MG/5ML syrup Take 200 mg by mouth 3 (three) times daily as needed for cough.   unknown at unknown  . loperamide (IMODIUM A-D) 2 MG tablet Take 2 mg by mouth every 4 (four) hours as needed for diarrhea or loose stools.   unknown at unknown  . Nutritional Supplements (FEEDING SUPPLEMENT, NEPRO CARB STEADY,) LIQD Take 237 mLs by mouth 2 (two) times daily between meals. 30 Can 0 unknown at unknown  . sulfamethoxazole-trimethoprim (BACTRIM DS,SEPTRA DS) 800-160 MG per tablet Take 1 tablet by mouth daily. (Patient not taking: Reported  on 03/16/2015) 3 tablet 0 Completed Course at Unknown time  . tiotropium (SPIRIVA) 18 MCG inhalation capsule Place 1 capsule (18 mcg total) into inhaler and inhale daily. 30 capsule 12 unknown at unknown   Assessment: Pt with ESRD on HD every Tues-Thurs-Sat HCAP.   Goal of Therapy:   Resolution of infection. Vancomycin trough 15 - 25 mcg/mL.   Plan:  Expected duration 7 days with resolution of temperature and/or normalization of WBC  Vancomycin 2 gm IV X 1 ordered for 6/27. Vancomycin 1 gm IV ordered to be given during last hour of each HD session. Will draw 1st trough 30 minutes before 3rd HD session scheduled for 7/2.  Ceftazidime 1 gm IV Q24H ordered (to be given after HD session on dialysis days).   Follow up cultures.  Chinita Greenland PharmD Clinical Pharmacist 03/24/2015

## 2015-03-24 NOTE — Progress Notes (Signed)
[facility] [place] NAME: [name]    MR#:  [id number]  DATE OF BIRTH:  Jul 27, 1941  SUBJECTIVE:  Patient still having some abdominal pain. NGT in place. BM earlier today.  REVIEW OF SYSTEMS:    Review of Systems  Constitutional: Negative for fever, chills and malaise/fatigue.  HENT: Negative for sore throat.   Eyes: Negative for blurred vision.  Respiratory: Positive for shortness of breath. Negative for cough, hemoptysis and wheezing.   Cardiovascular: Negative for chest pain, palpitations and leg swelling.  Gastrointestinal: Positive for abdominal pain. Negative for nausea, vomiting, diarrhea, constipation, blood in stool and melena.  Genitourinary: Negative for dysuria.  Musculoskeletal: Negative for back pain.  Neurological: Negative for dizziness, tremors and headaches.  Endo/Heme/Allergies: Does not bruise/bleed easily.   Tolerating Diet: Yes  DRUG ALLERGIES:   Allergies  Allergen Reactions  . Penicillins Hives, Itching and Swelling    Whelps, itching, and swelling    VITALS:  Blood pressure 116/69, pulse 77, temperature 97.7 F (36.5 C), temperature source Oral, resp. rate 18, height 6\' 2"  (1.88 m), weight 93.5 kg (206 lb 2.1 oz), SpO2 98 %.  PHYSICAL EXAMINATION:   Physical Exam  Constitutional: He is oriented to person, place, and time. No distress.  HENT:  Head: Normocephalic.  Eyes: No scleral icterus.  Neck: Normal range of motion. Neck supple. No JVD present. No tracheal deviation present.  Cardiovascular: Normal rate, regular rhythm and normal heart sounds.  Exam reveals no gallop and no friction rub.   No murmur heard. Pulmonary/Chest: Effort normal and breath sounds normal. No respiratory distress. He has no wheezes. He has no rales. He exhibits no tenderness.  Abdominal: Soft. He exhibits no distension and no mass. There is tenderness. There is no rebound and no guarding.  Musculoskeletal:  Normal range of motion. He exhibits no edema.  Neurological: He is alert and oriented to person, place, and time.  Hoarse voice   Skin: Skin is warm. No rash noted. No erythema.  Psychiatric: Affect normal.      LABORATORY PANEL:   CBC  Recent Labs Lab 03/24/15 0502  WBC 13.5*  HGB 7.8*  HCT 23.5*  PLT 212   ------------------------------------------------------------------------------------------------------------------  Chemistries   Recent Labs Lab 03/18/15 0504  03/24/15 0502  NA 136  < > 142  K 3.8  < > 3.6  CL 96*  < > 99*  CO2 29  < > 32  GLUCOSE 137*  < > 110*  BUN 45*  < > 19  CREATININE 7.62*  < > 4.34*  CALCIUM 8.7*  < > 8.3*  AST 12*  --   --   ALT 6*  --   --   ALKPHOS 74  --   --   BILITOT 0.8  --   --   < > = values in this interval not displayed. ------------------------------------------------------------------------------------------------------------------  Cardiac Enzymes  Recent Labs Lab 03/20/15 2229 03/21/15 0228 03/21/15 0813  TROPONINI 0.03 0.11* 0.04*   ------------------------------------------------------------------------------------------------------------------  RADIOLOGY:  Dg Chest Port 1 View  03/20/2015   CLINICAL DATA:  Short of breath.  Chest pain.  EXAM: PORTABLE CHEST - 1 VIEW  COMPARISON:  03/18/2015  FINDINGS: Cardiomegaly again seen. There is venous hypertension with mild interstitial edema. Areas of atelectasis in the lower lobes are again noted. The aorta is ectatic and unfolded.  IMPRESSION: Similar appearance of likely congestive heart failure. Cardiomegaly with mild interstitial edema. Atelectasis at the bases.  Electronically Signed   By: Nelson Chimes M.D.   On: 03/20/2015 16:22   Ct Angio Abd/pel W/ And/or W/o .  IMPRESSION: 1. No significant proximal mesenteric arterial occlusive disease to suggest an etiology of occlusive mesenteric ischemia. 2. Dilatation of mid abdominal bowel loops, decompressed  distally, without discrete transition point. 3. Patent infrarenal bifurcated stent graft with persistent type 2 endoleak and slight enlargement native aneurysm sac, now 5.8 cm diameter. 4. 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 5. New pelvic ascites 6. Increasing Bilateral pleural effusions with worsening consolidation/atelectasis in the lung bases.   Electronically Signed   By: Lucrezia Europe M.D.   On: 03/21/2015 13:13   ASSESSMENT AND PLAN:   74 year old male with end-stage renal disease on hemodialysis, congestive heart failure,. Paroxysmal atrial fibrillation, chronic anemia he was admitted for failed outpatient treatment for pneumonia.  1. Left lower lung  pneumonia: Patient was on Levaquin however failed therapy. Patient is currently on ceftaz  and vancomycin  Sputum cultures growing out gram-positive cocci in clusters as well as toxo bacilli. Blood cultures negative. Stop ceftaz. Continue vancomycin. Start levaquin.  2. Ileus - NG Tube in place. Improving slowly. Surgery on board   3. End-stage renal disease on hemodialysis: Patient will have dialysis on Tuesday, Thursday and Saturday.  4. Acute on chronic anemia: Patient received 1 unit of PBC is well in dialysis. Patient's hemoglobin is stable.   5. 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   6. Paroxsmal atrial fibrillation: Patient is currently normal sinus rhythm.  Rate is controlled. Continue amiodarone.  7. Hypertension: Patient had episode of hypotension after hemodialysis. Norvasc is on hold.  8. Hyperlipidemia: Patient will continue on Lipitor.  9. 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  CODE STATUS: FULL  TOTAL TIME TAKING CARE OF THIS PATIENT:  25 minutes.   Greater than 50% counseling and coordination of care   Hillary Bow R M.D  on 03/24/2015 at 1:40 PM  Between 7am to 6pm - Pager - [phone] After 6pm go to [email or web address] - password [email or web address] [facility]  Office  [phone]  CC: Primary care physician; Dion Body, MD

## 2015-03-24 NOTE — Progress Notes (Signed)
[facility]   PATIENT NAME: [name]    MR#:  [id number]  DATE OF BIRTH:  1940/11/25  SUBJECTIVE:   He is still having some abdominal pain, no flatus reported, NG placed late yesterday afternoon REVIEW OF SYSTEMS:   Review of Systems  Constitutional: Negative for fever.  Respiratory: Positive for cough. Negative for hemoptysis.   Cardiovascular: Negative for palpitations.  Gastrointestinal: Positive for nausea, vomiting and abdominal pain.  All other systems reviewed and are negative.   DRUG ALLERGIES:   Allergies  Allergen Reactions  . Penicillins Hives, Itching and Swelling    Whelps, itching, and swelling    VITALS:  Blood pressure 142/68, pulse 84, temperature 98.8 F (37.1 C), temperature source Oral, resp. rate 18, height 6\' 2"  (1.88 m), weight 93.5 kg (206 lb 2.1 oz), SpO2 98 %.  PHYSICAL EXAMINATION:  GENERAL:  74 y.o.-year-old patient lying in the bed with no acute distress. NGT in place, yellow bilious return. EYES: Pupils equal, round, reactive to light and accommodation. No scleral icterus. Extraocular muscles intact.  HEENT: Head atraumatic, normocephalic. Oropharynx and nasopharynx clear.  NECK:  Supple, no jugular venous distention. No thyroid enlargement, no tenderness. CARDIOVASCULAR: S1, S2 normal. No murmurs, rubs, or gallops.  ABDOMEN: Soft, mild tender, mild distension present.  nondistended. EXTREMITIES: No pedal edema, cyanosis, or clubbing.  NEUROLOGIC: Cranial nerves II through XII are intact. Muscle strength 5/5 in all extremities. Sensation intact. Gait not checked.  PSYCHIATRIC: The patient is alert and oriented x 3.  SKIN: No obvious rash, lesion, or ulcer.     ASSESSMENT AND PLAN:   Persoanlly reviewed films from today and yesterday,  They show improvement and gas in colon.  IMP Ileus, no surgical indication at present exists.  Plan:  Cont ngt, iv abx for PNA. Will follow.

## 2015-03-24 NOTE — Evaluation (Signed)
Physical Therapy Evaluation Patient Details Name: [name] MRN: [id number] DOB: December 25, 1940 Today's Date: 03/24/2015   History of Present Illness  74 yo male with onset of septic shock and respriatory distress, hypotension and atelectasis, recent bladder history, PMHx:  a-fib with RVR, prostate CA, ESRD T TH S, neurogenic bladder, AAA, anemia,   Clinical Impression  Pt presents with hx of anemia, small bowel obstruction, septic shock, GERD, allergic rhinitis, COPD, cognitive communication deficit, dementia, iliac aneurism, overactive bladder, TIA, bladder cancer, prostate cancer, renal insufficiency, and HTN. Examination revealed that pt performs bed mobility with mod assist +2 for lifting support and management of LE. Pt performs transfers at max assist +2 for lifting support, however pt was not able to complete a sit -> stand with RW due to LE weakness. He also complains of pain in right hip. Ambulation not assessed today secondary to LE weakness. Primary physical deficits include global weakness, decreased endurance, and pain. Pt will benefit from skilled PT in order to address these deficits and allow for a safe return to pt's home environment. SNF recommendation was discussed with pt and he agreed with that recommendation.     Follow Up Recommendations SNF    Equipment Recommendations  None recommended by PT    Recommendations for Other Services       Precautions / Restrictions Precautions Precautions: Fall Restrictions Weight Bearing Restrictions: No      Mobility  Bed Mobility Overal bed mobility: Needs Assistance;+2 for physical assistance Bed Mobility: Supine to Sit     Supine to sit: Mod assist;+2 for physical assistance     General bed mobility comments: Pt requires assistance with LE and upper body support. Also requires cueing on hand placement for self-assist   Transfers Overall transfer level: Needs assistance Equipment used: Rolling walker (2  wheeled) Transfers: Sit to/from Stand Sit to Stand: Max assist;+2 physical assistance         General transfer comment: Pt performs sit->stand with assist for lifting from bed. Pt not able to get hips into full extension and support himself on RW. Noted LE weakness. Transfer not successful. Pt was too fatigued following transfer attempt to try again.   Ambulation/Gait             General Gait Details: Not able to assess due to LE weakness   Stairs            Wheelchair Mobility    Modified Rankin (Stroke Patients Only)       Balance Overall balance assessment: Needs assistance Sitting-balance support: Bilateral upper extremity supported;Feet supported (Still requires min assist to stay upright)           Standing balance comment: Too weak to fully stand and assess balance                              Pertinent Vitals/Pain Pain Assessment:  (States he has pain in R hip) Pain Location: R hip    Home Living Family/patient expects to be discharged to:: Private residence Living Arrangements: Other relatives;Non-relatives/Friends Available Help at Discharge: Family;Friend(s) Type of Home: House Home Access: Ramped entrance     Home Layout: One level Home Equipment: Walker - 2 wheels Additional Comments: states somebody was with him at all times, 24 hrs.     Prior Function Level of Independence: Needs assistance   Gait / Transfers Assistance Needed: Needed assistance with transfers  ADL's / Homemaking Assistance Needed:  Needed assistance        Hand Dominance        Extremity/Trunk Assessment   Upper Extremity Assessment: Generalized weakness           Lower Extremity Assessment: Generalized weakness         Communication   Communication: Expressive difficulties (Hoarse voice, very quiet )  Cognition Arousal/Alertness: Awake/alert Behavior During Therapy: Restless Overall Cognitive Status: No family/caregiver present to  determine baseline cognitive functioning                      General Comments      Exercises        Assessment/Plan    PT Assessment Patient needs continued PT services  PT Diagnosis Difficulty walking;Abnormality of gait;Generalized weakness;Acute pain   PT Problem List Decreased strength;Decreased activity tolerance;Decreased balance;Decreased mobility;Cardiopulmonary status limiting activity;Pain  PT Treatment Interventions DME instruction;Gait training;Stair training;Functional mobility training;Therapeutic activities;Therapeutic exercise;Balance training;Neuromuscular re-education;Cognitive remediation;Patient/family education   PT Goals (Current goals can be found in the Care Plan section) Acute Rehab PT Goals Patient Stated Goal: To get help PT Goal Formulation: With patient Time For Goal Achievement: 04/07/15 Potential to Achieve Goals: Fair    Frequency Min 2X/week   Barriers to discharge        Co-evaluation               End of Session Equipment Utilized During Treatment: Gait belt;Oxygen Activity Tolerance: Patient limited by fatigue;Patient limited by pain (right hip pain complaints during session and transfer ) Patient left: with bed alarm set;in bed;with call bell/phone within reach Nurse Communication: Mobility status         Time: 8466-5993 PT Time Calculation (min) (ACUTE ONLY): 29 min   Charges:         PT G CodesJanyth Clarke 04/19/2015, 12:57 PM  [name], SPT. [phone]

## 2015-03-24 NOTE — Plan of Care (Signed)
Problem: Discharge Progression Outcomes Goal: Other Discharge Outcomes/Goals Outcome: Progressing PT saw patient today and recommends SNF for rebab Patient continues of 2L [place] sat in the high 90's  No c/o pain this shift VSS Patient continues on IV ABX, hemodialysis scheduled for tomorrow  Patient continues with NG tube for decompression  257ml  Out this shift Patient is NPO except for ice chips

## 2015-03-24 NOTE — Progress Notes (Signed)
Subjective:   Patient presents with cough and sputum He is admitted for management of pneumonia He reports poor appetite and feeling bad overall Continues to have abdominal distension  Overall, about the same   Objective:  Vital signs in last 24 hours:  Temp:  [97.7 F (36.5 C)-98.8 F (37.1 C)] 97.7 F (36.5 C) (07/01 1246) Pulse Rate:  [76-84] 77 (07/01 1246) Resp:  [17-18] 18 (07/01 0536) BP: (116-142)/(55-69) 116/69 mmHg (07/01 1246) SpO2:  [95 %-100 %] 98 % (07/01 1246)  Weight change:  Filed Weights   03/21/15 0942 03/21/15 1751 03/23/15 1332  Weight: 92.4 kg (203 lb 11.3 oz) 92 kg (202 lb 13.2 oz) 93.5 kg (206 lb 2.1 oz)    Intake/Output: I/O last 3 completed shifts: In: 32 [IV Piggyback:50] Out: 851 [Emesis/NG output:350; Other:500; Stool:1]     Physical Exam: General: NAD, ill appearing  HEENT Moist mucus membranes  Neck supple  Pulm/lungs Clear ant and lat, slightly tachypceic  CVS/Heart Irregular, no rub  Abdomen:  Distender, + tender, bowel sounds present  Extremities: + edema  Neurologic: Alert, follows commands  Skin: No acute rashes  Access: AVG       Basic Metabolic Panel:  Recent Labs Lab 03/20/15 1542 03/21/15 0518 03/22/15 0544 03/23/15 0500 03/24/15 0502  NA 136 136 141 138 142  K 3.4* 3.4* 3.9 3.6 3.6  CL 91* 95* 99* 97* 99*  CO2 29 29 34* 31 32  GLUCOSE 145* 113* 107* 123* 110*  BUN 41* 45* 24* 32* 19  CREATININE 7.46* 7.79* 5.08* 5.96* 4.34*  CALCIUM 8.6* 8.2* 8.4* 8.3* 8.3*     CBC:  Recent Labs Lab 03/18/15 0504 03/20/15 1542 03/21/15 0518 03/22/15 0544 03/23/15 0500 03/24/15 0502  WBC 9.9 11.8* 8.9 10.7* 12.9* 13.5*  NEUTROABS 8.9*  --   --   --   --   --   HGB 8.1* 7.5* 6.9* 8.1* 7.9* 7.8*  HCT 24.9* 23.2* 21.1* 24.9* 23.6* 23.5*  MCV 91.8 90.8 90.3 89.6 89.0 89.0  PLT 205 187 153 158 186 212      Microbiology: Results for orders placed or performed during the hospital encounter of 03/20/15  Blood  culture (routine x 2)     Status: None (Preliminary result)   Collection Time: 03/20/15  5:55 PM  Result Value Ref Range Status   Specimen Description BLOOD  Final   Special Requests Normal  Final   Culture NO GROWTH 2 DAYS  Final   Report Status PENDING  Incomplete  Blood culture (routine x 2)     Status: None (Preliminary result)   Collection Time: 03/20/15  5:55 PM  Result Value Ref Range Status   Specimen Description BLOOD  Final   Special Requests Normal  Final   Culture NO GROWTH 2 DAYS  Final   Report Status PENDING  Incomplete  MRSA PCR Screening     Status: Abnormal   Collection Time: 03/20/15  9:43 PM  Result Value Ref Range Status   MRSA by PCR POSITIVE (A) NEGATIVE Final    Comment:        The GeneXpert MRSA Assay (FDA approved for NASAL specimens only), is one component of a comprehensive MRSA colonization surveillance program. It is not intended to diagnose MRSA infection nor to guide or monitor treatment for MRSA infections. CRITICAL RESULT CALLED TO, READ BACK BY AND VERIFIED WITH: CALLED TO MEGAN OAKLEY AT 0059 03/21/15.TSH   Culture, expectorated sputum-assessment     Status: None  Collection Time: 03/20/15 11:01 PM  Result Value Ref Range Status   Specimen Description SPUTUM  Final   Special Requests Normal  Final   Sputum evaluation THIS SPECIMEN IS ACCEPTABLE FOR SPUTUM CULTURE  Final   Report Status 03/21/2015 FINAL  Final  Culture, respiratory (NON-Expectorated)     Status: None   Collection Time: 03/20/15 11:01 PM  Result Value Ref Range Status   Specimen Description SPUTUM  Final   Special Requests Normal Reflexed from [id number]  Final   Gram Stain   Final    MANY WBC SEEN FEW GRAM POSITIVE COCCI IN CLUSTERS FEW GRAM NEGATIVE COCCOBACILLI EXCELLENT SPECIMEN - 90-100% WBCS    Culture APPEARS TO BE NORMAL FLORA  Final   Report Status 03/23/2015 FINAL  Final  Urine culture     Status: None   Collection Time: 03/21/15 10:43 AM  Result Value Ref  Range Status   Specimen Description URINE, RANDOM  Final   Special Requests NONE  Final   Culture   Final    20,000 COLONIES/mL GRAM POSITIVE RODS NO FURTHER ID    Report Status 03/23/2015 FINAL  Final  Culture, blood (routine x 2)     Status: None (Preliminary result)   Collection Time: 03/21/15  3:30 PM  Result Value Ref Range Status   Specimen Description BLOOD  Final   Special Requests NONE  Final   Culture NO GROWTH < 24 HOURS  Final   Report Status PENDING  Incomplete  Culture, blood (routine x 2)     Status: None (Preliminary result)   Collection Time: 03/21/15  3:54 PM  Result Value Ref Range Status   Specimen Description BLOOD  Final   Special Requests NONE  Final   Culture NO GROWTH < 24 HOURS  Final   Report Status PENDING  Incomplete    Coagulation Studies: No results for input(s): LABPROT, INR in the last 72 hours.  Urinalysis: No results for input(s): COLORURINE, LABSPEC, PHURINE, GLUCOSEU, HGBUR, BILIRUBINUR, KETONESUR, PROTEINUR, UROBILINOGEN, NITRITE, LEUKOCYTESUR in the last 72 hours.  Invalid input(s): APPERANCEUR    Imaging: Dg Abd 1 View  03/24/2015   CLINICAL DATA:  Follow-up of ileus  EXAM: ABDOMEN - 1 VIEW  COMPARISON:  Abdominal film of March 23, 2015  FINDINGS: There remain loops of mildly distended gas-filled small bowel in the mid abdomen. The number of loops and degree of distention has decreased. There is gas and stool in the colon. A double-J ureteral stent on the left is present. Thereis an aorto bi-iliac stent graft in place. Embolization coils are present in the pelvis bilaterally.The esophagogastric tube tip lies in the region of the gastric cardia with proximal port at or above the GE junction.  IMPRESSION: There has been mild interval improvement in the appearance of the small bowel ileus though there remain several loops of mildly distended gas-filled small bowel.   Electronically Signed   By: David  Martinique M.D.   On: 03/24/2015 08:18   Dg Abd  1 View  03/23/2015   CLINICAL DATA:  Nasogastric tube placement  EXAM: ABDOMEN - 1 VIEW  COMPARISON:  Portable exam 1542 hours compared to 03/23/2015  FINDINGS: Tip of nasogastric tube projects over mid stomach.  LEFT ureteral stent noted.  Enlargement of cardiac silhouette with bibasilar atelectasis.  Diffuse small bowel dilatation which could represent small bowel obstruction or ileus.  IMPRESSION: Tip of nasogastric tube projects over mid stomach.  Diffuse small bowel dilatation which could represent small bowel  obstruction or ileus.  Bibasilar atelectasis.   Electronically Signed   By: Lavonia Dana M.D.   On: 03/23/2015 15:55   Dg Abd 1 View  03/23/2015   CLINICAL DATA:  Ileus  EXAM: ABDOMEN - 1 VIEW  COMPARISON:  Abdominal radiograph Feb 02, 2015; CT abdomen and pelvis March 21, 2015  FINDINGS: There is a persistent double-J stent on the left from the level of L2-3 to the bladder. There remain loops of dilated bowel in a pattern suggestive of ileus. No well-defined obstruction is seen. There is air in the rectum. There is a stent in the abdominal aorta and iliac arteries with evidence of native abdominal aortic aneurysm. There are coils in the regions of each hypogastric artery in the pelvis. 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  IMPRESSION: Bowel gas pattern appearance is consistent with persistent ileus. No free air seen. Aorto bi-iliac stent with native abdominal aortic aneurysm apparent. Double-J stent present on left.   Electronically Signed   By: Lowella Grip III M.D.   On: 03/23/2015 08:48     Medications:     . sodium chloride   Intravenous Once  . amiodarone  200 mg Oral Daily  . atorvastatin  40 mg Oral Daily  . budesonide-formoterol  2 puff Inhalation BID  . Chlorhexidine Gluconate Cloth  6 each Topical Q0600  . feeding supplement (NEPRO CARB STEADY)  237 mL Oral BID BM  . levofloxacin (LEVAQUIN) IV  250 mg Intravenous Q48H  . mupirocin ointment  1  application Nasal BID  . pantoprazole (PROTONIX) IV  40 mg Intravenous Q12H  . tiotropium  18 mcg Inhalation Daily  . [START ON 03/25/2015] vancomycin  1,000 mg Intravenous Once per day on Tue Thu Sat   acetaminophen **OR** acetaminophen, albuterol, alum & mag hydroxide-simeth, morphine injection, ondansetron **OR** ondansetron (ZOFRAN) IV, pneumococcal 23 valent vaccine  Assessment/ Plan:  74 y.o. 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 Next HD tomorrow  2. Anemia of chronic kidney disease:  EPO is avoided due to bladder cancer, prostate cancer hgb 7.8; blood transfusion given 6/27  3. Secondary Hyperparathyroidism: N25.81 - monitor phos  - Hold sevelamer  4.  Abdominal pain and distention / ileus - recent CT concerning for AAA leak - vascular surgery eval pending - distention improved slightly after NG placed  5. A. Fib with h/o RVR: continues on PO amiodarone.  6.  Pneumonia, likely bacteria- Empiric treatment with Vanc Zosyn  7. Urinary retention- I/O cath done 6/28.    8. Hypotension - hold amlodipine - BP improved   LOS: 4 Malayna Noori 7/1/20163:15 PM

## 2015-03-24 NOTE — Clinical Social Work Placement (Signed)
   CLINICAL SOCIAL WORK PLACEMENT  NOTE  Date:  03/24/2015  Patient Details  Name: [name] MRN: [id number] Date of Birth: [birth year]  Clinical Social Work is seeking post-discharge placement for this patient at the [place] level of care (*CSW will initial, date and re-position this form in  chart as items are completed):  Yes   Patient/family provided with [place] [facility] Work Department's list of facilities offering this level of care within the geographic area requested by the patient (or if unable, by the patient's family).  Yes   Patient/family informed of their freedom to choose among providers that offer the needed level of care, that participate in Medicare, Medicaid or managed care program needed by the patient, have an available bed and are willing to accept the patient.  Yes   Patient/family informed of [facility]'s ownership interest in [facility] and [facility], as well as of the fact that they are under no obligation to receive care at these facilities.  PASRR submitted to EDS on 03/23/15     PASRR number received on 03/23/15     Existing PASRR number confirmed on       FL2 transmitted to all facilities in geographic area requested by pt/family on 03/24/15     FL2 transmitted to all facilities within larger geographic area on       Patient informed that his/her managed care company has contracts with or will negotiate with certain facilities, including the following:            Patient/family informed of bed offers received.  Patient chooses bed at       Physician recommends and patient chooses bed at      Patient to be transferred to   on  .  Patient to be transferred to facility by       Patient family notified on   of transfer.  Name of family member notified:        PHYSICIAN       Additional Comment:    _______________________________________________ Darden Dates, LCSW 03/24/2015, 3:46 PM

## 2015-03-24 NOTE — Clinical Social Work Note (Signed)
Clinical Social Work Assessment  Patient Details  Name: [name] MRN: [id number] Date of Birth: Jun 22, 1941  Date of referral:  03/24/15               Reason for consult:  Facility Placement                Permission sought to share information with:  Family Supports Permission granted to share information::  Yes, Verbal Permission Granted  Name::      [name], cousin)   Housing/Transportation Living arrangements for the past 2 months:  [place] of Information:  Patient, Adult Children Patient Interpreter Needed:  None Criminal Activity/Legal Involvement Pertinent to Current Situation/Hospitalization:  No - Comment as needed Significant Relationships:  Other Family Members Lives with:  Self Do you feel safe going back to the place where you live?  Yes Need for family participation in patient care:  No (Coment)  Care giving concerns:  Pt lives alone.    Social Worker assessment / plan:  CSW met with pt to address consult. CSW introduced herself and explained role of social work. CSW also explained that PT is recommending SNF. PT shared that he would like me to call his cousin.   CSW contacted cousin, and CSW explained SNF placement. Pt's cousin shared that the preference is [facility].   CSW initiated bed search and will follow up with bed offers. CSW will continue to follow.   Employment status:  Retired Nurse, adult PT Recommendations:  [place] / Referral to community resources:  [place]  Patient/Family's Response to care:  Pt and pt's cousin were very pleasant and appreciative of CSW support.   Patient/Family's Understanding of and Emotional Response to Diagnosis, Current Treatment, and Prognosis:  Pt' cousin is agreeable to SNF placement.     Emotional Assessment Appearance:  Appears stated age Attitude/Demeanor/Rapport:  Other (appropriate) Affect (typically  observed):  Accepting Orientation:  Oriented to Self, Oriented to Place, Oriented to  Time, Oriented to Situation Alcohol / Substance use:  Never Used Psych involvement (Current and /or in the community):  No (Comment)  Discharge Needs  Concerns to be addressed:  No discharge needs identified Readmission within the last 30 days:  No Current discharge risk:  None Barriers to Discharge:  No Barriers Identified   Darden Dates, LCSW 03/24/2015, 3:26 PM

## 2015-03-25 LAB — CBC
HCT: 24.1 % — ABNORMAL LOW (ref 40.0–52.0)
Hemoglobin: 7.7 g/dL — ABNORMAL LOW (ref 13.0–18.0)
MCH: 28.8 pg (ref 26.0–34.0)
MCHC: 32 g/dL (ref 32.0–36.0)
MCV: 89.8 fL (ref 80.0–100.0)
PLATELETS: 232 10*3/uL (ref 150–440)
RBC: 2.68 MIL/uL — ABNORMAL LOW (ref 4.40–5.90)
RDW: 18.3 % — AB (ref 11.5–14.5)
WBC: 14.8 10*3/uL — ABNORMAL HIGH (ref 3.8–10.6)

## 2015-03-25 LAB — RENAL FUNCTION PANEL
ANION GAP: 16 — AB (ref 5–15)
Albumin: 2.1 g/dL — ABNORMAL LOW (ref 3.5–5.0)
BUN: 33 mg/dL — ABNORMAL HIGH (ref 6–20)
CO2: 26 mmol/L (ref 22–32)
Calcium: 8.4 mg/dL — ABNORMAL LOW (ref 8.9–10.3)
Chloride: 101 mmol/L (ref 101–111)
Creatinine, Ser: 6.44 mg/dL — ABNORMAL HIGH (ref 0.61–1.24)
GFR calc non Af Amer: 8 mL/min — ABNORMAL LOW (ref >60)
GFR, EST AFRICAN AMERICAN: 9 mL/min — AB (ref >60)
GLUCOSE: 82 mg/dL (ref 65–99)
POTASSIUM: 3.6 mmol/L (ref 3.5–5.1)
Phosphorus: 3 mg/dL (ref 2.5–4.6)
SODIUM: 143 mmol/L (ref 135–145)

## 2015-03-25 LAB — CULTURE, BLOOD (ROUTINE X 2)
CULTURE: NO GROWTH
Culture: NO GROWTH
SPECIAL REQUESTS: NORMAL
Special Requests: NORMAL

## 2015-03-25 LAB — VANCOMYCIN, TROUGH: VANCOMYCIN TR: 34 ug/mL — AB (ref 10–20)

## 2015-03-25 MED ORDER — VANCOMYCIN HCL IN DEXTROSE 1-5 GM/200ML-% IV SOLN
1000.0000 mg | INTRAVENOUS | Status: DC
  Filled 2015-03-25: qty 200

## 2015-03-25 NOTE — Progress Notes (Signed)
The patient has a known pseudoaneurysm of the right femoral artery and at some point this should be repaired.  I evaluated him for this in April of this year but given all his other problems he has not followed up with me for this problem.  This will be a fairly chanllanging operation and his overall condition would certainly need to be optimal.  Given his admission on this occasion for pneumonia this would not be the time for addressing his right femoral aneurysm.  Thankfully, he has not changed in size since the CT scan in April.  Of note the Type II endoleak is not problematic at this point since the aortic aneurysm sac has not been changing in size.

## 2015-03-25 NOTE — Progress Notes (Addendum)
ANTIBIOTIC CONSULT NOTE -follow up  Pharmacy Consult for Vancomycin Indication: pneumonia  Allergies  Allergen Reactions  . Penicillins Hives, Itching and Swelling    [place]helps, itching, and swelling    Patient Measurements: Height: 6\' 2"  (188 cm) Weight: 206 lb 2.1 oz (93.5 kg) IBW/kg (Calculated) : 82.2 Adjusted Body Weight: 88 kg  Vital Signs: Temp: 97.6 F (36.4 C) (07/02 0930) Temp Source: Oral (07/02 0930) BP: 117/68 mmHg (07/02 1100) Pulse Rate: 82 (07/02 1100) Labs:  Recent Labs  03/23/15 0500 03/24/15 0502 03/25/15 0954  WBC 12.9* 13.5* 14.8*  HGB 7.9* 7.8* 7.7*  PLT 186 212 232  CREATININE 5.96* 4.34* 6.44*   Estimated Creatinine Clearance: 11.9 mL/min (by C-G formula based on Cr of 6.44). HEMODIALYSIS pt   Pre HD vancomycin trough 7/2 @ 09:42: 34 mcg/ml  Microbiology: Recent Results (from the past 720 hour(s))  CULTURE, URINE COMPREHENSIVE     Status: None   Collection Time: 02/27/15  9:30 AM  Result Value Ref Range Status   Urine Culture, Comprehensive Final report  Final   Result 1 Comment  Final    Comment: No growth in 36 - 48 hours.  Microscopic Examination     Status: Abnormal   Collection Time: 02/27/15  9:30 AM  Result Value Ref Range Status   WBC, UA 11-30 (A) 0 -  5 /hpf Final   RBC, UA >30 (A) 0 -  2 /hpf Final   Epithelial Cells (non renal) 0-10 0 - 10 /hpf Final   Bacteria, UA Few None seen/Few Final  Microscopic Examination     Status: Abnormal   Collection Time: 03/16/15  2:02 PM  Result Value Ref Range Status   WBC, UA >30 (A) 0 -  5 /hpf Final   RBC, UA 3-10 (A) 0 -  2 /hpf Final   Epithelial Cells (non renal) 0-10 0 - 10 /hpf Final   Bacteria, UA Many (A) None seen/Few Final  CULTURE, URINE COMPREHENSIVE     Status: None   Collection Time: 03/16/15  2:30 PM  Result Value Ref Range Status   Urine Culture, Comprehensive Final report  Final   Result 1 Comment  Final    Comment: Diphtheroids, not Corynebacterium  urealyticum 50,000-100,000 colony forming units per mL Susceptibility not normally performed on this organism.   Organism ID by MALDI     Status: None   Collection Time: 03/16/15  2:30 PM  Result Value Ref Range Status   Organism ID by MALDI Comment  Final    Comment: Specimen has been received. Testing has been initiated.  Aerobic ID by MALDI     Status: None   Collection Time: 03/16/15  2:30 PM  Result Value Ref Range Status   Aerobic ID by MALDI Comment  Final    Comment: Corynebacterium striatum MALDI TOF analysis was performed using FDA cleared reagents and VITEK MS from [employer]. The database used for the identification of this organism was from a research use only (RUO) database the characteristics of this which were determined by [facility]. This test is used for clinical purposes and should not be regarded as investigational or for research use.   Blood culture (routine x 2)     Status: None   Collection Time: 03/20/15  5:55 PM  Result Value Ref Range Status   Specimen Description BLOOD  Final   Special Requests Normal  Final   Culture NO GROWTH 5 DAYS  Final   Report Status 03/25/2015 FINAL  Final  Blood culture (routine x 2)     Status: None   Collection Time: 03/20/15  5:55 PM  Result Value Ref Range Status   Specimen Description BLOOD  Final   Special Requests Normal  Final   Culture NO GROWTH 5 DAYS  Final   Report Status 03/25/2015 FINAL  Final  MRSA PCR Screening     Status: Abnormal   Collection Time: 03/20/15  9:43 PM  Result Value Ref Range Status   MRSA by PCR POSITIVE (A) NEGATIVE Final    Comment:        The GeneXpert MRSA Assay (FDA approved for NASAL specimens only), is one component of a comprehensive MRSA colonization surveillance program. It is not intended to diagnose MRSA infection nor to guide or monitor treatment for MRSA infections. CRITICAL RESULT CALLED TO, READ BACK BY AND VERIFIED WITH: CALLED TO MEGAN OAKLEY AT 0059 03/21/15.TSH    Culture, expectorated sputum-assessment     Status: None   Collection Time: 03/20/15 11:01 PM  Result Value Ref Range Status   Specimen Description SPUTUM  Final   Special Requests Normal  Final   Sputum evaluation THIS SPECIMEN IS ACCEPTABLE FOR SPUTUM CULTURE  Final   Report Status 03/21/2015 FINAL  Final  Culture, respiratory (NON-Expectorated)     Status: None   Collection Time: 03/20/15 11:01 PM  Result Value Ref Range Status   Specimen Description SPUTUM  Final   Special Requests Normal Reflexed from [id number]  Final   Gram Stain   Final    MANY WBC SEEN FEW GRAM POSITIVE COCCI IN CLUSTERS FEW GRAM NEGATIVE COCCOBACILLI EXCELLENT SPECIMEN - 90-100% WBCS    Culture APPEARS TO BE NORMAL FLORA  Final   Report Status 03/23/2015 FINAL  Final  Urine culture     Status: None   Collection Time: 03/21/15 10:43 AM  Result Value Ref Range Status   Specimen Description URINE, RANDOM  Final   Special Requests NONE  Final   Culture   Final    20,000 COLONIES/mL GRAM POSITIVE RODS NO FURTHER ID    Report Status 03/23/2015 FINAL  Final  Culture, blood (routine x 2)     Status: None (Preliminary result)   Collection Time: 03/21/15  3:30 PM  Result Value Ref Range Status   Specimen Description BLOOD  Final   Special Requests NONE  Final   Culture NO GROWTH 4 DAYS  Final   Report Status PENDING  Incomplete  Culture, blood (routine x 2)     Status: None (Preliminary result)   Collection Time: 03/21/15  3:54 PM  Result Value Ref Range Status   Specimen Description BLOOD  Final   Special Requests NONE  Final   Culture NO GROWTH 4 DAYS  Final   Report Status PENDING  Incomplete   Meds Anti-infectives    Start     Dose/Rate Route Frequency Ordered Stop   03/28/15 1200  vancomycin (VANCOCIN) IVPB 1000 mg/200 mL premix     1,000 mg 200 mL/hr over 60 Minutes Intravenous Every T-Th-Sa (Hemodialysis) 03/25/15 1110     03/25/15 1800  vancomycin (VANCOCIN) IVPB 1000 mg/200 mL premix  Status:   Discontinued     1,000 mg 200 mL/hr over 60 Minutes Intravenous Once per day on Tue Thu Sat 03/24/15 1428 03/25/15 1110   03/24/15 1400  Levofloxacin (LEVAQUIN) IVPB 250 mg  Status:  Discontinued     250 mg 50 mL/hr over 60 Minutes Intravenous Every 24 hours 03/24/15  1349 03/24/15 1349   03/24/15 1400  Levofloxacin (LEVAQUIN) IVPB 250 mg     250 mg 50 mL/hr over 60 Minutes Intravenous Every 48 hours 03/24/15 1349     03/22/15 2100  levofloxacin (LEVAQUIN) IVPB 500 mg  Status:  Discontinued     500 mg 100 mL/hr over 60 Minutes Intravenous Every 48 hours 03/20/15 2021 03/21/15 1158   03/21/15 1800  vancomycin (VANCOCIN) IVPB 1000 mg/200 mL premix  Status:  Discontinued     1,000 mg 200 mL/hr over 60 Minutes Intravenous Once per day on Tue Thu Sat 03/20/15 2012 03/24/15 1344   03/20/15 2100  levofloxacin (LEVAQUIN) IVPB 750 mg     750 mg 100 mL/hr over 90 Minutes Intravenous  Once 03/20/15 2021 03/21/15 0010   03/20/15 2030  cefTAZidime (FORTAZ) 1 g in dextrose 5 % 50 mL IVPB  Status:  Discontinued     1 g 100 mL/hr over 30 Minutes Intravenous Every 24 hours 03/20/15 2000 03/24/15 1349   03/20/15 2015  Levofloxacin (LEVAQUIN) IVPB 250 mg  Status:  Discontinued     250 mg 50 mL/hr over 60 Minutes Intravenous Every 24 hours 03/20/15 2000 03/20/15 2020   03/20/15 2015  vancomycin (VANCOCIN) 1,250 mg in sodium chloride 0.9 % 250 mL IVPB  Status:  Discontinued     1,250 mg 166.7 mL/hr over 90 Minutes Intravenous  Once 03/20/15 2000 03/20/15 2009   03/20/15 2015  vancomycin (VANCOCIN) 2,000 mg in sodium chloride 0.9 % 500 mL IVPB     2,000 mg 250 mL/hr over 120 Minutes Intravenous STAT 03/20/15 2010 03/21/15 0210      Assessment: Pt with ESRD on HD every Tues-Thurs-Sat, HCAP.   Vancomycin trough 7/2 (pre HD): 1mcg/ml, supra therapeutic  [facility] - patient given two doses of vancomycin on 6/30 - 1gm given in HD as ordered then 1gm dose given again that evening.  Goal of Therapy:   Resolution of infection. Vancomycin trough 15 - 25 mcg/mL.   Plan:  Pre HD trough high today, suspect due to patient receiving two 1gm doses on 6/30 (in error? Ordered as 1gm QHD to be given during last hour of diaysis).   Will hold vancomycin dose today and recheck pre-HD trough on 7/5. Vancomycin 1gm QHD ordered to resume with next session on 7/5.   Pharmacy to follow per consult.   Rexene Edison, PharmD Clinical Pharmacist   03/25/2015 11:21 AM

## 2015-03-25 NOTE — Progress Notes (Signed)
[facility]   PATIENT NAME: [name]    MR#:  [id number]  DATE OF BIRTH:  March 30, 1941  SUBJECTIVE:  He is passing gas had a bowel movement and denies any abdominal pain. He would like the nasogastric tube removed and would like to eat. Less short of breath. Tolerated dialysis today.  REVIEW OF SYSTEMS:   Review of Systems  Respiratory: Negative for cough.   Cardiovascular: Negative for chest pain and palpitations.  Gastrointestinal: Negative for heartburn, nausea, vomiting and abdominal pain.  Skin: Negative.   All other systems reviewed and are negative.  Total output 4 NG tube yesterday was 680 cc and approximately 250 cc since 7 AM this morning.  DRUG ALLERGIES:   Allergies  Allergen Reactions  . Penicillins Hives, Itching and Swelling    Whelps, itching, and swelling    VITALS:  Blood pressure 120/59, pulse 75, temperature 99.1 F (37.3 C), temperature source Oral, resp. rate 20, height 6\' 2"  (1.88 m), weight 91.9 kg (202 lb 9.6 oz), SpO2 100 %.  PHYSICAL EXAMINATION:  GENERAL:  74 y.o.-year-old patient lying in the bed with no acute distress. NG tube in place with slight bilious material. EYES: Pupils equal, round, reactive to light and accommodation. No scleral icterus. Extraocular muscles intact.  HEENT: Head atraumatic, normocephalic. Oropharynx and nasopharynx clear.  NECK:  Supple, no jugular venous distention. No thyroid enlargement, no tenderness.  LUNGS: Normal breath sounds bilaterally, no wheezing, rales,rhonchi or crepitation. No use of accessory muscles of respiration.  CARDIOVASCULAR: S1, S2 normal. No murmurs, rubs, or gallops.  ABDOMEN: Soft, nontender, nondistended. Bowel sounds present. No organomegaly or mass.  EXTREMITIES: No pedal edema, cyanosis, or clubbing.  NEUROLOGIC: Cranial nerves II through XII are intact. Muscle strength 5/5 in all extremities. Sensation intact. Gait not checked.  PSYCHIATRIC: The patient is alert and  oriented x 3.  SKIN: No obvious rash, lesion, or ulcer.     ASSESSMENT AND PLAN:    This patient is 74 result looks be having resolving ileus. I will place his nasogastric tube to gravity. He tolerates this well by morning I'll advance his diet and discontinue his nasogastric tube. Discussed with RN on duty.

## 2015-03-25 NOTE — Progress Notes (Signed)
Clinical Education officer, museum (CSW) presented bed offers to patient. Patient prefers [name]. [facility] is considering. Patient requested that CSW call his family member [name] and discuss offers. CSW left a Advertising account executive for [employer]. CSW will continue to follow and assist as needed.   Blima Rich, B[place] [phone]

## 2015-03-25 NOTE — Progress Notes (Signed)
Subjective:  Pt seen during HD, tolerating well.   Objective:  Vital signs in last 24 hours:  Temp:  [97.4 F (36.3 C)-98.7 F (37.1 C)] 97.4 F (36.3 C) (07/02 1214) Pulse Rate:  [72-82] 80 (07/02 1223) Resp:  [20-35] 28 (07/02 1223) BP: (113-149)/(65-80) 123/69 mmHg (07/02 1223) SpO2:  [98 %] 98 % (07/02 0610) Weight:  [91.9 kg (202 lb 9.6 oz)-93.5 kg (206 lb 2.1 oz)] 91.9 kg (202 lb 9.6 oz) (07/02 1223)  Weight change:  Filed Weights   03/23/15 1332 03/25/15 0920 03/25/15 1223  Weight: 93.5 kg (206 lb 2.1 oz) 93.5 kg (206 lb 2.1 oz) 91.9 kg (202 lb 9.6 oz)    Intake/Output: I/O last 3 completed shifts: In: 11 [IV Piggyback:50] Out: 1030 [Emesis/NG output:1030]     Physical Exam: General: NAD, ill appearing  HEENT Moist mucus membranes, NG in place  Neck supple  Pulm/lungs CTAB normal effort  CVS/Heart Irregular, no rub  Abdomen:  Distender, + tender, bowel sounds present  Extremities: + edema  Neurologic: Alert, follows commands  Skin: No acute rashes  Access: AVG       Basic Metabolic Panel:  Recent Labs Lab 03/21/15 0518 03/22/15 0544 03/23/15 0500 03/24/15 0502 03/25/15 0954  NA 136 141 138 142 143  K 3.4* 3.9 3.6 3.6 3.6  CL 95* 99* 97* 99* 101  CO2 29 34* 31 32 26  GLUCOSE 113* 107* 123* 110* 82  BUN 45* 24* 32* 19 33*  CREATININE 7.79* 5.08* 5.96* 4.34* 6.44*  CALCIUM 8.2* 8.4* 8.3* 8.3* 8.4*  PHOS  --   --   --   --  3.0     CBC:  Recent Labs Lab 03/21/15 0518 03/22/15 0544 03/23/15 0500 03/24/15 0502 03/25/15 0954  WBC 8.9 10.7* 12.9* 13.5* 14.8*  HGB 6.9* 8.1* 7.9* 7.8* 7.7*  HCT 21.1* 24.9* 23.6* 23.5* 24.1*  MCV 90.3 89.6 89.0 89.0 89.8  PLT 153 158 186 212 232      Microbiology: Results for orders placed or performed during the hospital encounter of 03/20/15  Blood culture (routine x 2)     Status: None   Collection Time: 03/20/15  5:55 PM  Result Value Ref Range Status   Specimen Description BLOOD  Final   Special Requests Normal  Final   Culture NO GROWTH 5 DAYS  Final   Report Status 03/25/2015 FINAL  Final  Blood culture (routine x 2)     Status: None   Collection Time: 03/20/15  5:55 PM  Result Value Ref Range Status   Specimen Description BLOOD  Final   Special Requests Normal  Final   Culture NO GROWTH 5 DAYS  Final   Report Status 03/25/2015 FINAL  Final  MRSA PCR Screening     Status: Abnormal   Collection Time: 03/20/15  9:43 PM  Result Value Ref Range Status   MRSA by PCR POSITIVE (A) NEGATIVE Final    Comment:        The GeneXpert MRSA Assay (FDA approved for NASAL specimens only), is one component of a comprehensive MRSA colonization surveillance program. It is not intended to diagnose MRSA infection nor to guide or monitor treatment for MRSA infections. CRITICAL RESULT CALLED TO, READ BACK BY AND VERIFIED WITH: CALLED TO MEGAN OAKLEY AT 0059 03/21/15.TSH   Culture, expectorated sputum-assessment     Status: None   Collection Time: 03/20/15 11:01 PM  Result Value Ref Range Status   Specimen Description SPUTUM  Final  Special Requests Normal  Final   Sputum evaluation THIS SPECIMEN IS ACCEPTABLE FOR SPUTUM CULTURE  Final   Report Status 03/21/2015 FINAL  Final  Culture, respiratory (NON-Expectorated)     Status: None   Collection Time: 03/20/15 11:01 PM  Result Value Ref Range Status   Specimen Description SPUTUM  Final   Special Requests Normal Reflexed from [id number]  Final   Gram Stain   Final    MANY WBC SEEN FEW GRAM POSITIVE COCCI IN CLUSTERS FEW GRAM NEGATIVE COCCOBACILLI EXCELLENT SPECIMEN - 90-100% WBCS    Culture APPEARS TO BE NORMAL FLORA  Final   Report Status 03/23/2015 FINAL  Final  Urine culture     Status: None   Collection Time: 03/21/15 10:43 AM  Result Value Ref Range Status   Specimen Description URINE, RANDOM  Final   Special Requests NONE  Final   Culture   Final    20,000 COLONIES/mL GRAM POSITIVE RODS NO FURTHER ID    Report Status  03/23/2015 FINAL  Final  Culture, blood (routine x 2)     Status: None (Preliminary result)   Collection Time: 03/21/15  3:30 PM  Result Value Ref Range Status   Specimen Description BLOOD  Final   Special Requests NONE  Final   Culture NO GROWTH 4 DAYS  Final   Report Status PENDING  Incomplete  Culture, blood (routine x 2)     Status: None (Preliminary result)   Collection Time: 03/21/15  3:54 PM  Result Value Ref Range Status   Specimen Description BLOOD  Final   Special Requests NONE  Final   Culture NO GROWTH 4 DAYS  Final   Report Status PENDING  Incomplete    Coagulation Studies: No results for input(s): LABPROT, INR in the last 72 hours.  Urinalysis: No results for input(s): COLORURINE, LABSPEC, PHURINE, GLUCOSEU, HGBUR, BILIRUBINUR, KETONESUR, PROTEINUR, UROBILINOGEN, NITRITE, LEUKOCYTESUR in the last 72 hours.  Invalid input(s): APPERANCEUR    Imaging: Dg Abd 1 View  03/24/2015   CLINICAL DATA:  Follow-up of ileus  EXAM: ABDOMEN - 1 VIEW  COMPARISON:  Abdominal film of March 23, 2015  FINDINGS: There remain loops of mildly distended gas-filled small bowel in the mid abdomen. The number of loops and degree of distention has decreased. There is gas and stool in the colon. A double-J ureteral stent on the left is present. Thereis an aorto bi-iliac stent graft in place. Embolization coils are present in the pelvis bilaterally.The esophagogastric tube tip lies in the region of the gastric cardia with proximal port at or above the GE junction.  IMPRESSION: There has been mild interval improvement in the appearance of the small bowel ileus though there remain several loops of mildly distended gas-filled small bowel.   Electronically Signed   By: David  Martinique M.D.   On: 03/24/2015 08:18   Dg Abd 1 View  03/23/2015   CLINICAL DATA:  Nasogastric tube placement  EXAM: ABDOMEN - 1 VIEW  COMPARISON:  Portable exam 1542 hours compared to 03/23/2015  FINDINGS: Tip of nasogastric tube  projects over mid stomach.  LEFT ureteral stent noted.  Enlargement of cardiac silhouette with bibasilar atelectasis.  Diffuse small bowel dilatation which could represent small bowel obstruction or ileus.  IMPRESSION: Tip of nasogastric tube projects over mid stomach.  Diffuse small bowel dilatation which could represent small bowel obstruction or ileus.  Bibasilar atelectasis.   Electronically Signed   By: Lavonia Dana M.D.   On: 03/23/2015 15:55  Medications:     . sodium chloride   Intravenous Once  . amiodarone  200 mg Oral Daily  . atorvastatin  40 mg Oral Daily  . budesonide-formoterol  2 puff Inhalation BID  . Chlorhexidine Gluconate Cloth  6 each Topical Q0600  . feeding supplement (NEPRO CARB STEADY)  237 mL Oral BID BM  . levofloxacin (LEVAQUIN) IV  250 mg Intravenous Q48H  . mupirocin ointment  1 application Nasal BID  . pantoprazole (PROTONIX) IV  40 mg Intravenous Q12H  . tiotropium  18 mcg Inhalation Daily  . [START ON 03/28/2015] vancomycin  1,000 mg Intravenous Q T,Th,Sa-HD   acetaminophen **OR** acetaminophen, albuterol, alum & mag hydroxide-simeth, morphine injection, ondansetron **OR** ondansetron (ZOFRAN) IV, pneumococcal 23 valent vaccine  Assessment/ Plan:  74 y.o. 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 Pt seen during HD today, tolerating well, next HD on Tuesday if still here.  2. Anemia of chronic kidney disease:  EPO is avoided due to bladder cancer, prostate cancer hgb down to 7.7, would continue to  monitor.  3. Secondary Hyperparathyroidism: N25.81 - phos 3.0 and acceptable.   4.  Abdominal pain and distention / ileus - recent CT concerning for AAA leak - NG in place.    LOS: [place], Damonta Cossey 7/2/201612:35 PM

## 2015-03-25 NOTE — Plan of Care (Signed)
Problem: Discharge Progression Outcomes Goal: Other Discharge Outcomes/Goals Outcome: Progressing Plan of care progress to goals: Barriers-patient is bed bound and HD (T/Th/Sat) Discharge plan- SNF when stable. O2 satuation-98% on 2LNC Pain- pt denies pain this shift. Hemodynamically stable- Currently WBC's 13.5 and hgb 7.8, continue to monitor. Complications resolved/contolled- pt c/o throat and nasal dryness, humidification applied to oxygen.  Diet- Pt is NPO except ice chips, tolerating. Activity-patient is bed bound, able to help assist with turns in bed. A&O patient, denies pain this shift. Oxygen saturations 98% on 2LNC. NG tube continues to drain yellow/brown gastric contents. Left upper arm AV fistula noted with +bruit and +thrill. VSS. NPO except ice chips, no c/o nausea or vomiting. Pt is scheduled for HD in the morning.

## 2015-03-25 NOTE — Progress Notes (Signed)
[facility] NAME: [name]    MR#:  [id number]  DATE OF BIRTH:  [birth year]  SUBJECTIVE:  Patient still having some abdominal pain. NGT in place. Seen during HD. No fluid being removed.  REVIEW OF SYSTEMS:    Review of Systems  Constitutional: Negative for fever, chills and malaise/fatigue.  HENT: Negative for sore throat.   Eyes: Negative for blurred vision.  Respiratory: Positive for shortness of breath. Negative for cough, hemoptysis and wheezing.   Cardiovascular: Negative for chest pain, palpitations and leg swelling.  Gastrointestinal: Positive for abdominal pain. Negative for nausea, vomiting, diarrhea, constipation, blood in stool and melena.  Genitourinary: Negative for dysuria.  Musculoskeletal: Negative for back pain.  Neurological: Negative for dizziness, tremors and headaches.  Endo/Heme/Allergies: Does not bruise/bleed easily.   Tolerating Diet: Yes  DRUG ALLERGIES:   Allergies  Allergen Reactions  . Penicillins Hives, Itching and Swelling    Whelps, itching, and swelling    VITALS:  Blood pressure 118/70, pulse 74, temperature 97.6 F (36.4 C), temperature source Oral, resp. rate 29, height 6\' 2"  (1.88 m), weight 93.5 kg (206 lb 2.1 oz), SpO2 98 %.  PHYSICAL EXAMINATION:   Physical Exam  Constitutional: He is oriented to person, place, and time. No distress.  HENT:  Head: Normocephalic.  Eyes: No scleral icterus.  Neck: Normal range of motion. Neck supple. No JVD present. No tracheal deviation present.  Cardiovascular: Normal rate, regular rhythm and normal heart sounds.  Exam reveals no gallop and no friction rub.   No murmur heard. Pulmonary/Chest: Effort normal and breath sounds normal. No respiratory distress. He has no wheezes. He has no rales. He exhibits no tenderness.  Abdominal: Soft. He exhibits no distension and no mass. There is tenderness. There is no rebound and no  guarding.  Musculoskeletal: Normal range of motion. He exhibits no edema.  Neurological: He is alert and oriented to person, place, and time.  Hoarse voice   Skin: Skin is warm. No rash noted. No erythema.  Psychiatric: Affect normal.      LABORATORY PANEL:   CBC  Recent Labs Lab 03/25/15 0954  WBC 14.8*  HGB 7.7*  HCT 24.1*  PLT 232   ------------------------------------------------------------------------------------------------------------------  Chemistries   Recent Labs Lab 03/25/15 0954  NA 143  K 3.6  CL 101  CO2 26  GLUCOSE 82  BUN 33*  CREATININE 6.44*  CALCIUM 8.4*   ------------------------------------------------------------------------------------------------------------------  Cardiac Enzymes  Recent Labs Lab 03/20/15 2229 03/21/15 0228 03/21/15 0813  TROPONINI 0.03 0.11* 0.04*   ------------------------------------------------------------------------------------------------------------------  RADIOLOGY:  Dg Chest Port 1 View  03/20/2015   CLINICAL DATA:  Short of breath.  Chest pain.  EXAM: PORTABLE CHEST - 1 VIEW  COMPARISON:  03/18/2015  FINDINGS: Cardiomegaly again seen. There is venous hypertension with mild interstitial edema. Areas of atelectasis in the lower lobes are again noted. The aorta is ectatic and unfolded.  IMPRESSION: Similar appearance of likely congestive heart failure. Cardiomegaly with mild interstitial edema. Atelectasis at the bases.   Electronically Signed   By: Nelson Chimes M.D.   On: 03/20/2015 16:22   Ct Angio Abd/pel W/ And/or W/o .  IMPRESSION: 1. No significant proximal mesenteric arterial occlusive disease to suggest an etiology of occlusive mesenteric ischemia. 2. Dilatation of mid abdominal bowel loops, decompressed distally, without discrete transition point. 3. Patent infrarenal bifurcated stent graft with persistent type 2 endoleak and slight enlargement native aneurysm sac, now 5.8  cm diameter. 4. 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 5. New pelvic ascites 6. Increasing Bilateral pleural effusions with worsening consolidation/atelectasis in the lung bases.   Electronically Signed   By: Lucrezia Europe M.D.   On: 03/21/2015 13:13   ASSESSMENT AND PLAN:   74 year old male with end-stage renal disease on hemodialysis, congestive heart failure,. Paroxysmal atrial fibrillation, chronic anemia he was admitted for failed outpatient treatment for pneumonia.  1. Left lower lung  pneumonia: Patient was on Levaquin however failed therapy. Patient was on ceftaz  and vancomycin . Switch to vancomycin and Levaquin. Sputum has GPC clusters.  2. Ileus - NG Tube in place. Improving slowly. Surgery on board   3. End-stage renal disease on hemodialysis: Patient will have dialysis on Tuesday, Thursday and Saturday.  4. Acute on chronic anemia: Patient received 1 unit of PBC is well in dialysis. Patient's hemoglobin is stable.   5. 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   6. Paroxsmal atrial fibrillation: Patient is currently normal sinus rhythm.  Rate is controlled. Continue amiodarone.  7. Hypertension: Patient had episode of hypotension after hemodialysis. Norvasc is on hold.  8. Hyperlipidemia: Patient will continue on Lipitor.  9. 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  CODE STATUS: FULL  TOTAL TIME TAKING CARE OF THIS PATIENT:  25 minutes.   Greater than 50% counseling and coordination of care   Hillary Bow R M.D on 03/25/2015 at 11:51 AM  Between 7am to 6pm - Pager - [phone] After 6pm go to [email or web address] - password [email or web address] [facility]  Office  [phone]  CC: Primary care physician; Dion Body, MD

## 2015-03-25 NOTE — Plan of Care (Signed)
Problem: Discharge Progression Outcomes Goal: Tolerating diet Outcome: Not Progressing Pt is alert and oriented x 4, bedbound at this time, dialysis this morning, continues on 2l oxygen, remains NPO, NG tube in place draining brown drainage, no bm throughout hsift, vital signs stable, low grade temp., uneventful shift.

## 2015-03-26 LAB — CULTURE, BLOOD (ROUTINE X 2)
Culture: NO GROWTH
Culture: NO GROWTH

## 2015-03-26 LAB — GLUCOSE, CAPILLARY: Glucose-Capillary: 79 mg/dL (ref 65–99)

## 2015-03-26 LAB — C DIFFICILE QUICK SCREEN W PCR REFLEX
C DIFFICILE (CDIFF) TOXIN: NEGATIVE
C DIFFICLE (CDIFF) ANTIGEN: POSITIVE

## 2015-03-26 LAB — CLOSTRIDIUM DIFFICILE BY PCR: Toxigenic C. Difficile by PCR: NEGATIVE

## 2015-03-26 NOTE — Plan of Care (Signed)
Problem: Discharge Progression Outcomes Goal: Other Discharge Outcomes/Goals Outcome: Progressing Patient is alert and oriented, no c/o of pain at this time. Remains on 2 L [place], normal oxygen saturations. NG tube remains on gravity with minimal drainage.

## 2015-03-26 NOTE — Progress Notes (Signed)
Subjective:  Pt had dialysis yesterday. Tolerated well. Hopefully NG to be removed today.  Objective:  Vital signs in last 24 hours:  Temp:  [97.4 F (36.3 C)-99.1 F (37.3 C)] 97.9 F (36.6 C) (07/03 0853) Pulse Rate:  [67-80] 67 (07/03 0853) Resp:  [18-33] 18 (07/03 0853) BP: (105-131)/(59-76) 115/61 mmHg (07/03 0853) SpO2:  [100 %] 100 % (07/03 0853) Weight:  [91.9 kg (202 lb 9.6 oz)] 91.9 kg (202 lb 9.6 oz) (07/02 1223)  Weight change:  Filed Weights   03/23/15 1332 03/25/15 0920 03/25/15 1223  Weight: 93.5 kg (206 lb 2.1 oz) 93.5 kg (206 lb 2.1 oz) 91.9 kg (202 lb 9.6 oz)    Intake/Output: I/O last 3 completed shifts: In: -  Out: 9201 [Urine:500; Emesis/NG output:909; Other:99]     Physical Exam: General: NAD, ill appearing  HEENT Moist mucus membranes, NG in place  Neck supple  Pulm/lungs CTAB normal effort  CVS/Heart Irregular, no rub  Abdomen:  Non distended, non tender today BS present  Extremities: + edema  Neurologic: Alert, follows commands  Skin: No acute rashes  Access: AVG       Basic Metabolic Panel:  Recent Labs Lab 03/21/15 0518 03/22/15 0544 03/23/15 0500 03/24/15 0502 03/25/15 0954  NA 136 141 138 142 143  K 3.4* 3.9 3.6 3.6 3.6  CL 95* 99* 97* 99* 101  CO2 29 34* 31 32 26  GLUCOSE 113* 107* 123* 110* 82  BUN 45* 24* 32* 19 33*  CREATININE 7.79* 5.08* 5.96* 4.34* 6.44*  CALCIUM 8.2* 8.4* 8.3* 8.3* 8.4*  PHOS  --   --   --   --  3.0     CBC:  Recent Labs Lab 03/21/15 0518 03/22/15 0544 03/23/15 0500 03/24/15 0502 03/25/15 0954  WBC 8.9 10.7* 12.9* 13.5* 14.8*  HGB 6.9* 8.1* 7.9* 7.8* 7.7*  HCT 21.1* 24.9* 23.6* 23.5* 24.1*  MCV 90.3 89.6 89.0 89.0 89.8  PLT 153 158 186 212 232      Microbiology: Results for orders placed or performed during the hospital encounter of 03/20/15  Blood culture (routine x 2)     Status: None   Collection Time: 03/20/15  5:55 PM  Result Value Ref Range Status   Specimen Description  BLOOD  Final   Special Requests Normal  Final   Culture NO GROWTH 5 DAYS  Final   Report Status 03/25/2015 FINAL  Final  Blood culture (routine x 2)     Status: None   Collection Time: 03/20/15  5:55 PM  Result Value Ref Range Status   Specimen Description BLOOD  Final   Special Requests Normal  Final   Culture NO GROWTH 5 DAYS  Final   Report Status 03/25/2015 FINAL  Final  MRSA PCR Screening     Status: Abnormal   Collection Time: 03/20/15  9:43 PM  Result Value Ref Range Status   MRSA by PCR POSITIVE (A) NEGATIVE Final    Comment:        The GeneXpert MRSA Assay (FDA approved for NASAL specimens only), is one component of a comprehensive MRSA colonization surveillance program. It is not intended to diagnose MRSA infection nor to guide or monitor treatment for MRSA infections. CRITICAL RESULT CALLED TO, READ BACK BY AND VERIFIED WITH: CALLED TO MEGAN OAKLEY AT 0059 03/21/15.TSH   Culture, expectorated sputum-assessment     Status: None   Collection Time: 03/20/15 11:01 PM  Result Value Ref Range Status   Specimen Description SPUTUM  Final   Special Requests Normal  Final   Sputum evaluation THIS SPECIMEN IS ACCEPTABLE FOR SPUTUM CULTURE  Final   Report Status 03/21/2015 FINAL  Final  Culture, respiratory (NON-Expectorated)     Status: None   Collection Time: 03/20/15 11:01 PM  Result Value Ref Range Status   Specimen Description SPUTUM  Final   Special Requests Normal Reflexed from [id number]  Final   Gram Stain   Final    MANY WBC SEEN FEW GRAM POSITIVE COCCI IN CLUSTERS FEW GRAM NEGATIVE COCCOBACILLI EXCELLENT SPECIMEN - 90-100% WBCS    Culture APPEARS TO BE NORMAL FLORA  Final   Report Status 03/23/2015 FINAL  Final  Urine culture     Status: None   Collection Time: 03/21/15 10:43 AM  Result Value Ref Range Status   Specimen Description URINE, RANDOM  Final   Special Requests NONE  Final   Culture   Final    20,000 COLONIES/mL GRAM POSITIVE RODS NO FURTHER ID     Report Status 03/23/2015 FINAL  Final  Culture, blood (routine x 2)     Status: None   Collection Time: 03/21/15  3:30 PM  Result Value Ref Range Status   Specimen Description BLOOD  Final   Special Requests NONE  Final   Culture NO GROWTH 5 DAYS  Final   Report Status 03/26/2015 FINAL  Final  Culture, blood (routine x 2)     Status: None   Collection Time: 03/21/15  3:54 PM  Result Value Ref Range Status   Specimen Description BLOOD  Final   Special Requests NONE  Final   Culture NO GROWTH 5 DAYS  Final   Report Status 03/26/2015 FINAL  Final    Coagulation Studies: No results for input(s): LABPROT, INR in the last 72 hours.  Urinalysis: No results for input(s): COLORURINE, LABSPEC, PHURINE, GLUCOSEU, HGBUR, BILIRUBINUR, KETONESUR, PROTEINUR, UROBILINOGEN, NITRITE, LEUKOCYTESUR in the last 72 hours.  Invalid input(s): APPERANCEUR    Imaging: No results found.   Medications:     . sodium chloride   Intravenous Once  . amiodarone  200 mg Oral Daily  . atorvastatin  40 mg Oral Daily  . budesonide-formoterol  2 puff Inhalation BID  . feeding supplement (NEPRO CARB STEADY)  237 mL Oral BID BM  . levofloxacin (LEVAQUIN) IV  250 mg Intravenous Q48H  . pantoprazole (PROTONIX) IV  40 mg Intravenous Q12H  . tiotropium  18 mcg Inhalation Daily  . [START ON 03/28/2015] vancomycin  1,000 mg Intravenous Q T,Th,Sa-HD   acetaminophen **OR** acetaminophen, albuterol, alum & mag hydroxide-simeth, morphine injection, ondansetron **OR** ondansetron (ZOFRAN) IV, pneumococcal 23 valent vaccine  Assessment/ Plan:  74 y.o. 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 Pt had HD yesterday, no acute indication for HD today, will plan for HD again on Tuesday.  2. Anemia of chronic kidney disease:  EPO is avoided due to bladder cancer, prostate cancer Continue to monitor CBC.  3. Secondary Hyperparathyroidism: N25.81 -pt not on binders, phos 3, will monitor bone mineral metabolism.  4.  Abdominal pain and distention / ileus -  recent CT concerning for AAA leak - NG in place, hopefully can be taken out today.   LOS: 6 Colden Samaras 7/3/201611:41 AM

## 2015-03-26 NOTE — Progress Notes (Signed)
[facility]   PATIENT NAME: Eston Heslin    MR#:  [id number]  DATE OF BIRTH:  21-Nov-1940  SUBJECTIVE:  No abd pain, several loose stools, c diff pending, tolerated BG to gravity without n/v.  Hungry.  Passing flatus.  Family at bedside.  REVIEW OF SYSTEMS:   Review of Systems  Constitutional: Negative for fever and chills.  Respiratory: Positive for cough.   Cardiovascular: Negative for chest pain.  Gastrointestinal: Negative.  Negative for heartburn.  Skin: Negative for rash.  Neurological: Negative for headaches.  All other systems reviewed and are negative.   DRUG ALLERGIES:   Allergies  Allergen Reactions  . Penicillins Hives, Itching and Swelling    Whelps, itching, and swelling    VITALS:  Blood pressure 115/61, pulse 67, temperature 97.9 F (36.6 C), temperature source Axillary, resp. rate 18, height 6\' 2"  (1.88 m), weight 91.9 kg (202 lb 9.6 oz), SpO2 100 %.   Filed Vitals:   03/25/15 1303 03/25/15 2139 03/26/15 0519 03/26/15 0853  BP: 120/59 111/64 105/59 115/61  Pulse: 75 72 72 67  Temp: 99.1 F (37.3 C) 97.9 F (36.6 C) 98.5 F (36.9 C) 97.9 F (36.6 C)  TempSrc: Oral Oral Oral Axillary  Resp: 20 18 18 18   Height:      Weight:      SpO2: 100% 100% 100% 100%   CBC Latest Ref Rng 03/25/2015 03/24/2015 03/23/2015  WBC 3.8 - 10.6 K/uL 14.8(H) 13.5(H) 12.9(H)  Hemoglobin 13.0 - 18.0 g/dL 7.7(L) 7.8(L) 7.9(L)  Hematocrit 40.0 - 52.0 % 24.1(L) 23.5(L) 23.6(L)  Platelets 150 - 440 K/uL 232 212 186      PHYSICAL EXAMINATION:  GENERAL:  74 y.o.-year-old patient lying in the bed with no acute distress.  EYES: Pupils equal, round, reactive to light and accommodation. No scleral icterus. Extraocular muscles intact.  HEENT: Head atraumatic, normocephalic. Oropharynx and nasopharynx clear.  NECK:  Supple, no jugular venous distention. No thyroid enlargement, no tenderness.  LUNGS: Normal breath sounds bilaterally, no wheezing, rales,rhonchi or  crepitation. No use of accessory muscles of respiration.  CARDIOVASCULAR: S1, S2 normal. No murmurs, rubs, or gallops.  ABDOMEN: Soft, nontender, nondistended. Bowel sounds present. No organomegaly or mass.  EXTREMITIES: No pedal edema, cyanosis, or clubbing. Left upper arm AV fistula with thrill. NEUROLOGIC: Cranial nerves II through XII are intact. Muscle strength 5/5 in all extremities. Sensation intact. Gait not checked.  PSYCHIATRIC: The patient is alert and oriented x 3.  SKIN: No obvious rash, lesion, or ulcer.     ASSESSMENT AND PLAN:   74 year old male with multiple medical problems pneumonia and resolved ileus secondary to infection. Nasogastric tube was removed this morning and I will be advanced. We will follow with you but I suspect that he will not require any Surgical intervention at this time.   Discussed with the patient, nurse and family at bedside.

## 2015-03-26 NOTE — Plan of Care (Signed)
Problem: Discharge Progression Outcomes Goal: Other Discharge Outcomes/Goals Outcome: Progressing Plan of care progress to goals: Barriers-patient is chair bound with  HD (T/Th/Sat) Left upper arm AV fistula noted with +bruit and +thrill Discharge plan- SNF when stable. O2 satuation-in the high 90's  2LNC humidified  Pain- pt denies pain this shift. Complications no c/o of pain or discomfort this shift, no nausea or vomiting   NG tube removed per MD order patient tolerated procedure well. Patient diet advance to regular diet patient ate 80% of meal with no c/o of N/V  .

## 2015-03-26 NOTE — Progress Notes (Signed)
[name] at [place] NAME: [name]    MR#:  [id number]  DATE OF BIRTH:  Jul 29, 1941  SUBJECTIVE:  Patient still having some abdominal pain. NGT in place on gravity. SOB much improved  REVIEW OF SYSTEMS:    Review of Systems  Constitutional: Negative for fever, chills and malaise/fatigue.  HENT: Negative for sore throat.   Eyes: Negative for blurred vision.  Respiratory: Positive for shortness of breath. Negative for cough, hemoptysis and wheezing.   Cardiovascular: Negative for chest pain, palpitations and leg swelling.  Gastrointestinal: Positive for abdominal pain. Negative for nausea, vomiting, diarrhea, constipation, blood in stool and melena.  Genitourinary: Negative for dysuria.  Musculoskeletal: Negative for back pain.  Neurological: Negative for dizziness, tremors and headaches.  Endo/Heme/Allergies: Does not bruise/bleed easily.    DRUG ALLERGIES:   Allergies  Allergen Reactions  . Penicillins Hives, Itching and Swelling    Whelps, itching, and swelling    VITALS:  Blood pressure 115/61, pulse 67, temperature 97.9 F (36.6 C), temperature source Axillary, resp. rate 18, height 6\' 2"  (1.88 m), weight 91.9 kg (202 lb 9.6 oz), SpO2 100 %.  PHYSICAL EXAMINATION:   Physical Exam  Constitutional: He is oriented to person, place, and time. No distress.  HENT:  Head: Normocephalic.  Eyes: No scleral icterus.  Neck: Normal range of motion. Neck supple. No JVD present. No tracheal deviation present.  Cardiovascular: Normal rate, regular rhythm and normal heart sounds.  Exam reveals no gallop and no friction rub.   No murmur heard. Pulmonary/Chest: Effort normal and breath sounds normal. No respiratory distress. He has no wheezes. He has no rales. He exhibits no tenderness.  Abdominal: Soft. He exhibits no distension and no mass. There is tenderness. There is no rebound and no guarding.  Musculoskeletal: Normal  range of motion. He exhibits no edema.  Neurological: He is alert and oriented to person, place, and time.  Hoarse voice   Skin: Skin is warm. No rash noted. No erythema.  Psychiatric: Affect normal.      LABORATORY PANEL:   CBC  Recent Labs Lab 03/25/15 0954  WBC 14.8*  HGB 7.7*  HCT 24.1*  PLT 232   ------------------------------------------------------------------------------------------------------------------  Chemistries   Recent Labs Lab 03/25/15 0954  NA 143  K 3.6  CL 101  CO2 26  GLUCOSE 82  BUN 33*  CREATININE 6.44*  CALCIUM 8.4*   ------------------------------------------------------------------------------------------------------------------  Cardiac Enzymes  Recent Labs Lab 03/20/15 2229 03/21/15 0228 03/21/15 0813  TROPONINI 0.03 0.11* 0.04*   ------------------------------------------------------------------------------------------------------------------  RADIOLOGY:  Dg Chest Port 1 View  03/20/2015   CLINICAL DATA:  Short of breath.  Chest pain.  EXAM: PORTABLE CHEST - 1 VIEW  COMPARISON:  03/18/2015  FINDINGS: Cardiomegaly again seen. There is venous hypertension with mild interstitial edema. Areas of atelectasis in the lower lobes are again noted. The aorta is ectatic and unfolded.  IMPRESSION: Similar appearance of likely congestive heart failure. Cardiomegaly with mild interstitial edema. Atelectasis at the bases.   Electronically Signed   By: Nelson Chimes M.D.   On: 03/20/2015 16:22   Ct Angio Abd/pel W/ And/or W/o .  IMPRESSION: 1. No significant proximal mesenteric arterial occlusive disease to suggest an etiology of occlusive mesenteric ischemia. 2. Dilatation of mid abdominal bowel loops, decompressed distally, without discrete transition point. 3. Patent infrarenal bifurcated stent graft with persistent type 2 endoleak and slight enlargement native aneurysm sac, now 5.8 cm diameter. 4. 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 5. New pelvic ascites 6. Increasing Bilateral pleural effusions with worsening consolidation/atelectasis in the lung bases.   Electronically Signed   By: Lucrezia Europe M.D.   On: 03/21/2015 13:13   ASSESSMENT AND PLAN:   75 year old male with end-stage renal disease on hemodialysis, congestive heart failure,. Paroxysmal atrial fibrillation, chronic anemia he was admitted for failed outpatient treatment for pneumonia.  1. Left lower lung  pneumonia: Patient was on Levaquin however failed therapy. Patient was on ceftaz  and vancomycin . Switched to vancomycin and Levaquin. Sputum has GPC clusters.  2. Ileus - NG Tube in place. Improving slowly. Surgery on board   3. End-stage renal disease on hemodialysis: Patient will have dialysis on Tuesday, Thursday and Saturday.  4. Acute on chronic anemia: Patient received 1 unit of PBC is well in dialysis. Patient's hemoglobin is stable.   5. 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   6. Paroxsmal atrial fibrillation: Patient is currently normal sinus rhythm.  Rate is controlled. Continue amiodarone.  7. Hypertension: Patient had episode of hypotension after hemodialysis. Norvasc is on hold.  8. Hyperlipidemia: Patient will continue on Lipitor.  9. 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  CODE STATUS: FULL  TOTAL TIME TAKING CARE OF THIS PATIENT:  25 minutes.   Greater than 50% counseling and coordination of care   Hillary Bow R M.D on 03/26/2015 at 11:44 AM  Between 7am to 6pm - Pager - [phone] After 6pm go to [email or web address] - password [email or web address] [facility]  Office  [phone]  CC: Primary care physician; Dion Body, MD

## 2015-03-27 DIAGNOSIS — J159 Unspecified bacterial pneumonia: Secondary | ICD-10-CM | POA: Diagnosis not present

## 2015-03-27 LAB — SUSCEPTIBILITY, GRAM POS RODS

## 2015-03-27 LAB — SPECIMEN STATUS REPORT

## 2015-03-27 MED ORDER — FERROUS SULFATE 325 (65 FE) MG PO TABS: 325.0000 mg | ORAL_TABLET | Freq: Every day | ORAL | Status: DC

## 2015-03-27 MED ORDER — LEVOFLOXACIN 500 MG PO TABS: 500.0000 mg | ORAL_TABLET | ORAL | Status: AC

## 2015-03-27 MED ORDER — POLYETHYLENE GLYCOL 3350 17 G PO PACK: 17.0000 g | PACK | Freq: Every day | ORAL | Status: DC

## 2015-03-27 NOTE — Care Management (Signed)
IM given °

## 2015-03-27 NOTE — Progress Notes (Signed)
Nutrition Follow-up  DOCUMENTATION CODES:  Non-severe (moderate) malnutrition in context of chronic illness  INTERVENTION: Meals and Snacks: Cater to patient preferences Medical Food Supplement Therapy: recommend continuing  Nepro shake as ordered  NUTRITION DIAGNOSIS:  Inadequate oral intake related to chronic illness as evidenced by estimated needs, per patient/family report.  GOAL:  Patient will meet greater than or equal to 90% of their needs; ongoing  MONITOR:   (Energy Intake, Anthropometrics, Digestive System, Electrolyte/Renal Profile, Glucose Profile)  ASSESSMENT:  Pt with resolving ileus, NG tube has since been removed and pt eating solid foods without n/v this am. Possible d/c today.   Diet Order: Diet Order:  Diet regular Room service appropriate?: Yes; Fluid consistency:: Thin Diet - low sodium heart healthy  Current Nutrition: Pt reports eating well this am, pancakes and sausage. Pt had unopened can of Nepro at bedside, just delivered this am. Pt reports tolerating solids well. Recorded po intake 80% of meal yesterday per Nsg.  Gastrointestinal Profile: Last BM: loose stools this am, c.diff negative Other Output: 22mL out per HD on Saturday  Medications: Protonix  Electrolyte/Renal Profile and Glucose Profile:   Recent Labs Lab 03/23/15 0500 03/24/15 0502 03/25/15 0954  NA 138 142 143  K 3.6 3.6 3.6  CL 97* 99* 101  CO2 31 32 26  BUN 32* 19 33*  CREATININE 5.96* 4.34* 6.44*  CALCIUM 8.3* 8.3* 8.4*  PHOS  --   --  3.0  GLUCOSE 123* 110* 82   Protein Profile:  Recent Labs Lab 03/25/15 0954  ALBUMIN 2.1*   Weight Trend since Admission: Filed Weights   03/23/15 1332 03/25/15 0920 03/25/15 1223  Weight: 206 lb 2.1 oz (93.5 kg) 206 lb 2.1 oz (93.5 kg) 202 lb 9.6 oz (91.9 kg)    BMI:  Body mass index is 26 kg/(m^2).  Estimated Nutritional Needs:  Kcal:  2283-2699 kcals (BEE 1730, 1.2 AF, 1.1-1.3 IF) using current wt of 92 kg  Protein:   110-138 g (1.2-1.5 g/kg)  using current wt of 92 kg  Fluid:  1000 mL plus UOP  EDUCATION NEEDS:  No education needs identified at this time   [place], RD, LDN Pager [phone]

## 2015-03-27 NOTE — Discharge Instructions (Addendum)
°  DIET:  Renal diet  DISCHARGE CONDITION:  Stable  ACTIVITY:  Activity as tolerated  OXYGEN:  Home Oxygen: Yes.     Oxygen Delivery: 2 liters/min via Patient connected to nasal cannula oxygen  DISCHARGE LOCATION:  home   If you experience worsening of your admission symptoms, develop shortness of breath, life threatening emergency, suicidal or homicidal thoughts you must seek medical attention immediately by calling 911 or calling your MD immediately  if symptoms less severe.  You Must read complete instructions/literature along with all the possible adverse reactions/side effects for all the Medicines you take and that have been prescribed to you. Take any new Medicines after you have completely understood and accpet all the possible adverse reactions/side effects.   Please note  You were cared for by a hospitalist during your hospital stay. If you have any questions about your discharge medications or the care you received while you were in the hospital after you are discharged, you can call the unit and asked to speak with the hospitalist on call if the hospitalist that took care of you is not available. Once you are discharged, your primary care physician will handle any further medical issues. Please note that NO REFILLS for any discharge medications will be authorized once you are discharged, as it is imperative that you return to your primary care physician (or establish a relationship with a primary care physician if you do not have one) for your aftercare needs so that they can reassess your need for medications and monitor your lab values.   DAILY FLUIDS LESS THAN [place], RN, NURSING AIDE AND SOCIAL WORK

## 2015-03-27 NOTE — Care Management (Signed)
Spoke with Mr. Catanzaro at the bedside. States he has a person that stays with him in the home. Also, he already receives services thru [facility] in the home. Home Oxygen is thru [facility].  Telephone call to Ms. Zoila Shutter [phone]). States that she and her son will transport Mr. Delancey home. Will add aide and social worker to home services. Will update Dr. Darvin Neighbours. Shelbie Ammons RN MSN Care Management [phone]

## 2015-03-27 NOTE — Progress Notes (Signed)
Subjective:  Pt feeling better. NG removed. Due for HD again tomorrow.  Objective:  Vital signs in last 24 hours:  Temp:  [97.5 F (36.4 C)-97.8 F (36.6 C)] 97.8 F (36.6 C) (07/04 1043) Pulse Rate:  [70-76] 70 (07/04 1043) Resp:  [18-20] 18 (07/04 1043) BP: (106-129)/(56-61) 106/58 mmHg (07/04 1043) SpO2:  [96 %-100 %] 100 % (07/04 1043)  Weight change:  Filed Weights   03/23/15 1332 03/25/15 0920 03/25/15 1223  Weight: 93.5 kg (206 lb 2.1 oz) 93.5 kg (206 lb 2.1 oz) 91.9 kg (202 lb 9.6 oz)    Intake/Output:       Physical Exam: General: NAD  HEENT Moist mucus membranes  Neck supple  Pulm/lungs CTAB normal effort  CVS/Heart Irregular, no rub  Abdomen:  Non distended, non tender today BS present  Extremities: + edema  Neurologic: Alert, follows commands  Skin: No acute rashes  Access: AVG       Basic Metabolic Panel:  Recent Labs Lab 03/21/15 0518 03/22/15 0544 03/23/15 0500 03/24/15 0502 03/25/15 0954  NA 136 141 138 142 143  K 3.4* 3.9 3.6 3.6 3.6  CL 95* 99* 97* 99* 101  CO2 29 34* 31 32 26  GLUCOSE 113* 107* 123* 110* 82  BUN 45* 24* 32* 19 33*  CREATININE 7.79* 5.08* 5.96* 4.34* 6.44*  CALCIUM 8.2* 8.4* 8.3* 8.3* 8.4*  PHOS  --   --   --   --  3.0     CBC:  Recent Labs Lab 03/21/15 0518 03/22/15 0544 03/23/15 0500 03/24/15 0502 03/25/15 0954  WBC 8.9 10.7* 12.9* 13.5* 14.8*  HGB 6.9* 8.1* 7.9* 7.8* 7.7*  HCT 21.1* 24.9* 23.6* 23.5* 24.1*  MCV 90.3 89.6 89.0 89.0 89.8  PLT 153 158 186 212 232      Microbiology: Results for orders placed or performed during the hospital encounter of 03/20/15  Blood culture (routine x 2)     Status: None   Collection Time: 03/20/15  5:55 PM  Result Value Ref Range Status   Specimen Description BLOOD  Final   Special Requests Normal  Final   Culture NO GROWTH 5 DAYS  Final   Report Status 03/25/2015 FINAL  Final  Blood culture (routine x 2)     Status: None   Collection Time: 03/20/15  5:55  PM  Result Value Ref Range Status   Specimen Description BLOOD  Final   Special Requests Normal  Final   Culture NO GROWTH 5 DAYS  Final   Report Status 03/25/2015 FINAL  Final  MRSA PCR Screening     Status: Abnormal   Collection Time: 03/20/15  9:43 PM  Result Value Ref Range Status   MRSA by PCR POSITIVE (A) NEGATIVE Final    Comment:        The GeneXpert MRSA Assay (FDA approved for NASAL specimens only), is one component of a comprehensive MRSA colonization surveillance program. It is not intended to diagnose MRSA infection nor to guide or monitor treatment for MRSA infections. CRITICAL RESULT CALLED TO, READ BACK BY AND VERIFIED WITH: CALLED TO MEGAN OAKLEY AT 0059 03/21/15.TSH   Culture, expectorated sputum-assessment     Status: None   Collection Time: 03/20/15 11:01 PM  Result Value Ref Range Status   Specimen Description SPUTUM  Final   Special Requests Normal  Final   Sputum evaluation THIS SPECIMEN IS ACCEPTABLE FOR SPUTUM CULTURE  Final   Report Status 03/21/2015 FINAL  Final  Culture, respiratory (NON-Expectorated)  Status: None   Collection Time: 03/20/15 11:01 PM  Result Value Ref Range Status   Specimen Description SPUTUM  Final   Special Requests Normal Reflexed from [id number]  Final   Gram Stain   Final    MANY WBC SEEN FEW GRAM POSITIVE COCCI IN CLUSTERS FEW GRAM NEGATIVE COCCOBACILLI EXCELLENT SPECIMEN - 90-100% WBCS    Culture APPEARS TO BE NORMAL FLORA  Final   Report Status 03/23/2015 FINAL  Final  Urine culture     Status: None   Collection Time: 03/21/15 10:43 AM  Result Value Ref Range Status   Specimen Description URINE, RANDOM  Final   Special Requests NONE  Final   Culture   Final    20,000 COLONIES/mL GRAM POSITIVE RODS NO FURTHER ID    Report Status 03/23/2015 FINAL  Final  Culture, blood (routine x 2)     Status: None   Collection Time: 03/21/15  3:30 PM  Result Value Ref Range Status   Specimen Description BLOOD  Final    Special Requests NONE  Final   Culture NO GROWTH 5 DAYS  Final   Report Status 03/26/2015 FINAL  Final  Culture, blood (routine x 2)     Status: None   Collection Time: 03/21/15  3:54 PM  Result Value Ref Range Status   Specimen Description BLOOD  Final   Special Requests NONE  Final   Culture NO GROWTH 5 DAYS  Final   Report Status 03/26/2015 FINAL  Final  C difficile quick scan w PCR reflex ([facility] only)     Status: None   Collection Time: 03/26/15  4:20 PM  Result Value Ref Range Status   C Diff antigen POSITIVE  Final   C Diff toxin NEGATIVE  Final   C Diff interpretation   Final    Negative for toxigenic C. difficile. Toxin gene and active toxin production not detected. May be a nontoxigenic strain of C. difficile bacteria present, lacking the ability to produce toxin.  Clostridium Difficile by PCR (not at [facility])     Status: None   Collection Time: 03/26/15  4:20 PM  Result Value Ref Range Status   C difficile by pcr NEGATIVE NEGATIVE Final    Coagulation Studies: No results for input(s): LABPROT, INR in the last 72 hours.  Urinalysis: No results for input(s): COLORURINE, LABSPEC, PHURINE, GLUCOSEU, HGBUR, BILIRUBINUR, KETONESUR, PROTEINUR, UROBILINOGEN, NITRITE, LEUKOCYTESUR in the last 72 hours.  Invalid input(s): APPERANCEUR    Imaging: No results found.   Medications:     . sodium chloride   Intravenous Once  . amiodarone  200 mg Oral Daily  . atorvastatin  40 mg Oral Daily  . budesonide-formoterol  2 puff Inhalation BID  . feeding supplement (NEPRO CARB STEADY)  237 mL Oral BID BM  . levofloxacin (LEVAQUIN) IV  250 mg Intravenous Q48H  . pantoprazole (PROTONIX) IV  40 mg Intravenous Q12H  . tiotropium  18 mcg Inhalation Daily   acetaminophen **OR** acetaminophen, albuterol, alum & mag hydroxide-simeth, morphine injection, ondansetron **OR** ondansetron (ZOFRAN) IV, pneumococcal 23 valent vaccine  Assessment/ Plan:  74 y.o. 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 Pt had HD on Saturday, no acute indication for HD today, will plan for HD again on Tuesday as outpt.  2. Anemia of chronic kidney disease:  EPO is avoided due to bladder cancer, prostate cancer Continue to monitor CBC as outpt.  3. Secondary Hyperparathyroidism: N25.81 -follow bone mineral metabolism as outpt.  4.  Abdominal pain and distention / ileus - recent CT concerning for AAA leak - it appears ileus resolved.     LOS: 7 Manila Rommel 7/4/201611:03 AM

## 2015-03-27 NOTE — Clinical Social Work Note (Signed)
CSW was updated that pt has changed his mind that he would like to return home. CSW also spoke with pt's cousin regarding discharge plan. CSW updated RNCM of need for home health services. CSW recommends a social work be added to the home health team. [facility] will now follow. CSW is signing off as no further needs identified.   Darden Dates, MSW, LCSW Clinical Social Worker  [phone]

## 2015-03-27 NOTE — Plan of Care (Signed)
Problem: Discharge Progression Outcomes Goal: Other Discharge Outcomes/Goals Outcome: Progressing Goals: discharge to SNF today, pt and family aware of discharge and pt voiced understanding.

## 2015-03-27 NOTE — Progress Notes (Signed)
No c/p pain this shift Patient is tolerating diet well MD ordered discharge to home  Patient is already followed by a home health service Discharged via wheelchair with family members and nursing staff

## 2015-03-28 ENCOUNTER — Other Ambulatory Visit: Payer: Medicare Other | Admitting: Urology

## 2015-03-28 NOTE — Discharge Summary (Signed)
[place] [facility] [place] NAME: [name]    MR#:  [id number]  DATE OF BIRTH:  09/01/1941  DATE OF ADMISSION:  03/20/2015 ADMITTING PHYSICIAN: Loletha Grayer, MD  DATE OF DISCHARGE: 03/27/2015  4:07 PM  PRIMARY CARE PHYSICIAN: Dion Body, MD    ADMISSION DIAGNOSIS:  Healthcare-associated pneumonia [J18.9]  DISCHARGE DIAGNOSIS:  Active Problems:   Chronic combined systolic and diastolic CHF (congestive heart failure)   Anemia   Ileus   Pneumonia   Abdominal pain   Malnutrition of moderate degree   SECONDARY DIAGNOSIS:   Past Medical History  Diagnosis Date  . PAF (paroxysmal atrial fibrillation)     a. new onset 12/2014 in the setting of UTI, sepsis, hypotension, and anemia; b. not on long term anticoagulation given anemia; c. family aware of stroke risk, they are ok with this; d. on amiodarone   . Chronic combined systolic and diastolic CHF (congestive heart failure)     a. echo 12/2014: EF 25-30%, anterior wall wall motion abnormalities; b. planned ischemic evaluation once patient is stable medically; c. echo 01/2015: EF 45-50%, no RWMA, mild AT, LA mildly dilated, mod pericardial effusion along LV free wall, no evidence of hemodynamic compromise  . ESRD on hemodialysis     a. Tuesday, Thursday, and Saturdays  . Anemia     a. baseline hgb ~ 8  . History of small bowel obstruction     a. 01/2015  . History of septic shock     a. 01/2015  . GERD (gastroesophageal reflux disease)   . Allergic rhinitis   . COPD (chronic obstructive pulmonary disease)   . Hypercholesteremia   . Cognitive communication deficit   . Magnesium deficiency   . Dementia   . Chronic indwelling Foley catheter   . Iliac aneurysm   . Incontinence   . Hydronephrosis   . Overactive bladder   . Detrusor sphincter dyssynergia   . Mini stroke   . Bladder cancer   . Prostate cancer   . Renal insufficiency   . Hypertension      ADMITTING  HISTORY  [name] is a 74 y.o. male with a known history of end-stage renal disease on hemodialysis, congestive heart failure, paroxysmal atrial fibrillation, anemia. He was recently diagnosed with pneumonia and started on Levaquin. He is coming back to the ER again with not feeling well. He's been short of breath he's been coughing up clear phlegm. He's been tired and very weak and his stomach has been sore. He did have some nausea vomiting on Saturday. Hospitalist services were contacted for further evaluation for failed outpatient treatment of pneumonia.   HOSPITAL COURSE:   74 year old male with end-stage renal disease on hemodialysis, congestive heart failure,. Paroxysmal atrial fibrillation, chronic anemia he was admitted for failed outpatient treatment for pneumonia.  1. Left lower lung pneumonia: Patient was on Levaquin however failed therapy. Patient was on ceftaz and vancomycin . Switched to Levaquin at time of discharge. Sputum cx grew Normal flora  2. Ileus - NG removed and pt tolerated multiple meals befoe discharge  3. End-stage renal disease on hemodialysis: Patient will have dialysis on Tuesday, Thursday and Saturday.  4. Acute on chronic anemia: Patient received 1 unit of PBC is well in dialysis. Patient's hemoglobin is stable.   5. 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   6. Paroxsmal atrial fibrillation: Patient is currently normal sinus rhythm.  Rate is  controlled. Continue amiodarone.  7. Hypertension: Patient had episode of hypotension after hemodialysis. Norvasc is on hold.  8. Hyperlipidemia: Patient will continue on Lipitor.  9. 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  CODE STATUS: FULL  SNF was recommended to patient but he wanted to go home and refused SNF. Home health set up prior to discharge  with RN, Aide, PT and SW.  CONSULTS OBTAINED:  Treatment Team:  Katha Cabal, MD Murlean Iba, MD Sherri Rad, MD  DRUG ALLERGIES:   Allergies  Allergen Reactions  . Penicillins Hives, Itching and Swelling    Whelps, itching, and swelling    DISCHARGE MEDICATIONS:   Discharge Medication List as of 03/27/2015  1:18 PM    START taking these medications   Details  ferrous sulfate 325 (65 FE) MG tablet Take 1 tablet (325 mg total) by mouth daily with breakfast., Starting 03/27/2015, Until Discontinued, Print    polyethylene glycol (MIRALAX / GLYCOLAX) packet Take 17 g by mouth daily., Starting 03/27/2015, Until Discontinued, Print      CONTINUE these medications which have CHANGED   Details  levofloxacin (LEVAQUIN) 500 MG tablet Take 1 tablet (500 mg total) by mouth every other day., Starting 03/27/2015, Until Mon 04/03/15, Print      CONTINUE these medications which have NOT CHANGED   Details  albuterol (PROVENTIL) (2.5 MG/3ML) 0.083% nebulizer solution Take 2.5 mg by nebulization every 6 (six) hours as needed for wheezing or shortness of breath., Until Discontinued, Historical Med    amiodarone (PACERONE) 200 MG tablet Take 1 tablet (200 mg total) by mouth daily., Starting 02/27/2015, Until Discontinued, Normal    amLODipine (NORVASC) 5 MG tablet Take 5 mg by mouth daily. , Starting 02/28/2015, Until Discontinued, Historical Med    atorvastatin (LIPITOR) 40 MG tablet Take 1 tablet (40 mg total) by mouth daily., Starting 02/03/2015, Until Discontinued, Normal    budesonide-formoterol (SYMBICORT) 160-4.5 MCG/ACT inhaler Inhale 2 puffs into the lungs 2 (two) times daily., Until Discontinued, Historical Med    magnesium oxide (MAG-OX) 400 MG tablet Take 400 mg by mouth at bedtime., Until Discontinued, Historical Med    multivitamin (RENA-VIT) TABS tablet Take 1 tablet by mouth daily., Until Discontinued, Historical Med    pantoprazole (PROTONIX) 40 MG tablet Take 1 tablet (40 mg  total) by mouth daily., Starting 02/03/2015, Until Discontinued, Print    sevelamer carbonate (RENVELA) 800 MG tablet Take 800 mg by mouth 3 (three) times daily with meals. , Until Discontinued, Historical Med    Tiotropium Bromide Monohydrate 2.5 MCG/ACT AERS Inhale 1 puff into the lungs daily., Until Discontinued, Historical Med    albuterol (PROVENTIL HFA;VENTOLIN HFA) 108 (90 BASE) MCG/ACT inhaler Inhale 2 puffs into the lungs every 4 (four) hours as needed for wheezing or shortness of breath (Every 4 to 6 hours)., Until Discontinued, Historical Med    chlorpheniramine (CHLOR-TRIMETON) 4 MG tablet Take 4 mg by mouth every 4 (four) hours as needed for allergies., Until Discontinued, Historical Med    guaifenesin (ROBITUSSIN) 100 MG/5ML syrup Take 200 mg by mouth 3 (three) times daily as needed for cough., Until Discontinued, Historical Med    loperamide (IMODIUM A-D) 2 MG tablet Take 2 mg by mouth every 4 (four) hours as needed for diarrhea or loose stools., Until Discontinued, Historical Med    Nutritional Supplements (FEEDING SUPPLEMENT, NEPRO CARB STEADY,) LIQD Take 237 mLs by mouth 2 (two) times  daily between meals., Starting 02/03/2015, Until Discontinued, Normal    sulfamethoxazole-trimethoprim (BACTRIM DS,SEPTRA DS) 800-160 MG per tablet Take 1 tablet by mouth daily., Starting 03/09/2015, Until Discontinued, Normal    tiotropium (SPIRIVA) 18 MCG inhalation capsule Place 1 capsule (18 mcg total) into inhaler and inhale daily., Starting 02/17/2015, Until Discontinued, Normal         Today    VITAL SIGNS:  Blood pressure 103/54, pulse 71, temperature 97.7 F (36.5 C), temperature source Oral, resp. rate 18, height 6\' 2"  (1.88 m), weight 91.9 kg (202 lb 9.6 oz), SpO2 100 %.  I/O:  No intake or output data in the 24 hours ending 03/28/15 1635  PHYSICAL EXAMINATION:  Physical Exam  GENERAL:  74 y.o.-year-old patient lying in the bed with no acute distress.  LUNGS: Normal breath  sounds bilaterally, no wheezing, rales,rhonchi or crepitation. No use of accessory muscles of respiration.  CARDIOVASCULAR: S1, S2 normal. No murmurs, rubs, or gallops.  ABDOMEN: Soft, non-tender, non-distended. Bowel sounds present. No organomegaly or mass.  NEUROLOGIC: Moves all 4 extremities. PSYCHIATRIC: The patient is alert and oriented x 3.  SKIN: No obvious rash, lesion, or ulcer.   DATA REVIEW:   CBC  Recent Labs Lab 03/25/15 0954  WBC 14.8*  HGB 7.7*  HCT 24.1*  PLT 232    Chemistries   Recent Labs Lab 03/25/15 0954  NA 143  K 3.6  CL 101  CO2 26  GLUCOSE 82  BUN 33*  CREATININE 6.44*  CALCIUM 8.4*    Cardiac Enzymes No results for input(s): TROPONINI in the last 168 hours.  Microbiology Results  Results for orders placed or performed during the hospital encounter of 03/20/15  Blood culture (routine x 2)     Status: None   Collection Time: 03/20/15  5:55 PM  Result Value Ref Range Status   Specimen Description BLOOD  Final   Special Requests Normal  Final   Culture NO GROWTH 5 DAYS  Final   Report Status 03/25/2015 FINAL  Final  Blood culture (routine x 2)     Status: None   Collection Time: 03/20/15  5:55 PM  Result Value Ref Range Status   Specimen Description BLOOD  Final   Special Requests Normal  Final   Culture NO GROWTH 5 DAYS  Final   Report Status 03/25/2015 FINAL  Final  MRSA PCR Screening     Status: Abnormal   Collection Time: 03/20/15  9:43 PM  Result Value Ref Range Status   MRSA by PCR POSITIVE (A) NEGATIVE Final    Comment:        The GeneXpert MRSA Assay (FDA approved for NASAL specimens only), is one component of a comprehensive MRSA colonization surveillance program. It is not intended to diagnose MRSA infection nor to guide or monitor treatment for MRSA infections. CRITICAL RESULT CALLED TO, READ BACK BY AND VERIFIED WITH: CALLED TO MEGAN OAKLEY AT 0059 03/21/15.TSH   Culture, expectorated sputum-assessment     Status:  None   Collection Time: 03/20/15 11:01 PM  Result Value Ref Range Status   Specimen Description SPUTUM  Final   Special Requests Normal  Final   Sputum evaluation THIS SPECIMEN IS ACCEPTABLE FOR SPUTUM CULTURE  Final   Report Status 03/21/2015 FINAL  Final  Culture, respiratory (NON-Expectorated)     Status: None   Collection Time: 03/20/15 11:01 PM  Result Value Ref Range Status   Specimen Description SPUTUM  Final   Special Requests Normal Reflexed from  [id number]  Final   Gram Stain   Final    MANY WBC SEEN FEW GRAM POSITIVE COCCI IN CLUSTERS FEW GRAM NEGATIVE COCCOBACILLI EXCELLENT SPECIMEN - 90-100% WBCS    Culture APPEARS TO BE NORMAL FLORA  Final   Report Status 03/23/2015 FINAL  Final  Urine culture     Status: None   Collection Time: 03/21/15 10:43 AM  Result Value Ref Range Status   Specimen Description URINE, RANDOM  Final   Special Requests NONE  Final   Culture   Final    20,000 COLONIES/mL GRAM POSITIVE RODS NO FURTHER ID    Report Status 03/23/2015 FINAL  Final  Culture, blood (routine x 2)     Status: None   Collection Time: 03/21/15  3:30 PM  Result Value Ref Range Status   Specimen Description BLOOD  Final   Special Requests NONE  Final   Culture NO GROWTH 5 DAYS  Final   Report Status 03/26/2015 FINAL  Final  Culture, blood (routine x 2)     Status: None   Collection Time: 03/21/15  3:54 PM  Result Value Ref Range Status   Specimen Description BLOOD  Final   Special Requests NONE  Final   Culture NO GROWTH 5 DAYS  Final   Report Status 03/26/2015 FINAL  Final  C difficile quick scan w PCR reflex ([facility] only)     Status: None   Collection Time: 03/26/15  4:20 PM  Result Value Ref Range Status   C Diff antigen POSITIVE  Final   C Diff toxin NEGATIVE  Final   C Diff interpretation   Final    Negative for toxigenic C. difficile. Toxin gene and active toxin production not detected. May be a nontoxigenic strain of C. difficile bacteria present, lacking the  ability to produce toxin.  Clostridium Difficile by PCR (not at [facility])     Status: None   Collection Time: 03/26/15  4:20 PM  Result Value Ref Range Status   C difficile by pcr NEGATIVE NEGATIVE Final    RADIOLOGY:  No results found.    Follow up with PCP in 1 week.  Management plans discussed with the patient, family and they are in agreement.  CODE STATUS:  Advance Directive Documentation        Most Recent Value   Type of Advance Directive  Healthcare Power of Attorney [pt reports "IDA is power of attorney"]   Pre-existing out of facility DNR order (yellow form or pink MOST form)     "MOST" Form in Place?        TOTAL TIME TAKING CARE OF THIS PATIENT ON DAY OF DISCHARGE: more than 30 minutes.    Hillary Bow R M.D on 03/28/2015 at 4:35 PM  Between 7am to 6pm - Pager - [phone]  After 6pm go to [email or web address] - password [email or web address] [email or web address]  Office  [phone]  CC: Primary care physician; Dion Body, MD

## 2015-04-05 NOTE — Telephone Encounter (Signed)
Spoke with patient's cousin [name] and notified her of results and stated we would like to reschedule his cysto stent removal. I gave two options for next week 7-18 @11 :30 or 7-19@1 :30 or 2:00pm. She  Is going to contact the patient and his at home nurse and call back to let us know what time works best/SW

## 2015-04-05 NOTE — Telephone Encounter (Signed)
Lets get [name] back on the schedule for stent removal.  His follow up urine culture in the hospital was negative.    Ethan Espy, MD

## 2015-04-10 ENCOUNTER — Ambulatory Visit (INDEPENDENT_AMBULATORY_CARE_PROVIDER_SITE_OTHER): Payer: Medicare Other | Admitting: Urology

## 2015-04-10 VITALS — BP 124/70 | HR 67 | Ht 74.0 in | Wt 215.0 lb

## 2015-04-10 DIAGNOSIS — Z960 Presence of urogenital implants: Secondary | ICD-10-CM

## 2015-04-10 DIAGNOSIS — J449 Chronic obstructive pulmonary disease, unspecified: Secondary | ICD-10-CM | POA: Insufficient documentation

## 2015-04-10 LAB — MICROSCOPIC EXAMINATION: Bacteria, UA: NONE SEEN

## 2015-04-10 LAB — URINALYSIS, COMPLETE
Bilirubin, UA: NEGATIVE
Ketones, UA: NEGATIVE
Nitrite, UA: NEGATIVE
PH UA: 8.5 — AB (ref 5.0–7.5)
SPEC GRAV UA: 1.02 (ref 1.005–1.030)
Urobilinogen, Ur: 0.2 mg/dL (ref 0.2–1.0)

## 2015-04-10 MED ORDER — LIDOCAINE HCL 2 % EX GEL
1.0000 "application " | Freq: Once | CUTANEOUS | Status: AC
  Administered 2015-04-10: 1 via URETHRAL

## 2015-04-10 MED ORDER — CIPROFLOXACIN HCL 500 MG PO TABS
500.0000 mg | ORAL_TABLET | Freq: Once | ORAL | Status: AC
  Administered 2015-04-10: 500 mg via ORAL

## 2015-04-10 NOTE — Progress Notes (Signed)
In and Out Catheterization  Patient is present today for a I & O catheterization to get a urine specimen prior to cysto, patient unable to void on his own. Patient was cleaned and prepped in a sterile fashion with betadine and Lidocaine 2% jelly was instilled into the urethra.  A 14FR cath was inserted no complications were noted , 361ml of urine return was noted, urine was cloudy and yellow in color. A clean urine sample was collected for UA. Bladder was drained  And catheter was removed with out difficulty.    Preformed by: Fonnie Jarvis, CMA

## 2015-04-10 NOTE — Progress Notes (Signed)
8:21 PM   [name] 17-Jul-1941 [id number]  Referring provider: Dion Body, MD [place], [place] [address]  Chief Complaint  Patient presents with  . Cysto Stent Removal       HPI: [name] is a 74 year old African-American male who presents today for stent removal.  His left ureteral stent was removed one month ago but unable to snare right sided stent.  He returned a week later for his other stent removal, however, urine culture was positive therefore procedure was deferred.  Since last visit, He was admitted to the hospital for pneumonia and ileus. It have a urine culture during that admission which had no growth.    He was initially seen in follow-up after being hospitalized for septic shock on 02/13/2015 believed to have been the result of a urinary tract infection.Marland Kitchen He was discharged from hospital 02/17/2015.  He has a long-standing history of bilateral hydronephrosis and has undergone ureteral stent placement and exchanges in the past. The ureteral stents were originally  placed in 2013 by Dr. 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 Ureteral stricture vs. High pressure bladder (variable documentation).  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 During his admission in October 2015 there was a discussion between the urologist on call and the patient's nephrologist about the necessity of having ureteral stents indwelling. At that time, it was decided that the ureteral stents should come out and not be replaced. Upon review of the records, I could not find a reason why the stents were not removed at that time. It does seem that the patient has various social economic factors and other medical issues working against him.  He was hospitalized again in April 2016 and was once again noted to  have bilateral hydronephrosis. At that time, he had other pressing medical issues and the stent removal was not addressed at that time.    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 It was demonstrated he had metastatic disease on a CT scan and bone scan in 2012.  He has been receiving Lupron injections every 6 months. His last PSA was 2.15 on 01/30/2015.  Patient has a history of bladder cancer as well.  Reviewing the records, it appears he underwent surveillance cystoscopy in November 2015. I do not believe any recurrence was noted.  There is a pathology report from 2013 which noted papillary urothelial carcinoma, low-grade.  He is currently on HD but does void/ make urine.    PMH: Past Medical History  Diagnosis Date  . PAF (paroxysmal atrial fibrillation)     a. new onset 12/2014 in the setting of UTI, sepsis, hypotension, and anemia; b. not on long term anticoagulation given anemia; c. family aware of stroke risk, they are ok with this; d. on amiodarone   . Chronic combined systolic and diastolic CHF (congestive heart failure)     a. echo 12/2014: EF 25-30%, anterior wall wall motion abnormalities; b. planned ischemic evaluation once patient is stable medically; c. echo 01/2015: EF 45-50%, no RWMA, mild AT, LA mildly dilated, mod pericardial effusion along LV free wall, no evidence of hemodynamic compromise  . ESRD on hemodialysis     a. Tuesday, Thursday, and Saturdays  . Anemia     a. baseline hgb ~ 8  .  History of small bowel obstruction     a. 01/2015  . History of septic shock     a. 01/2015  . GERD (gastroesophageal reflux disease)   . Allergic rhinitis   . COPD (chronic obstructive pulmonary disease)   . Hypercholesteremia   . Cognitive communication deficit   . Magnesium deficiency   . Dementia   . Chronic indwelling Foley catheter   . Iliac aneurysm   . Incontinence   . Hydronephrosis   . Overactive bladder   .  Detrusor sphincter dyssynergia   . Mini stroke   . Bladder cancer   . Prostate cancer   . Renal insufficiency   . Hypertension     Surgical History: Past Surgical History  Procedure Laterality Date  . Cystoscopy w/ ureteral stent placement    . Transurethral resection of bladder tumor with gyrus (turbt-gyrus)    . Ureteral stent placement    . Prostate surgery      Removal  . Av fistula placement      left arm    Home Medications:    Medication List       This list is accurate as of: 04/10/15  8:21 PM.  Always use your most recent med list.               amiodarone 200 MG tablet  Commonly known as:  PACERONE  Take 1 tablet (200 mg total) by mouth daily.     amLODipine 2.5 MG tablet  Commonly known as:  NORVASC     atorvastatin 40 MG tablet  Commonly known as:  LIPITOR  Take 1 tablet (40 mg total) by mouth daily.     budesonide-formoterol 160-4.5 MCG/ACT inhaler  Commonly known as:  SYMBICORT  Inhale 2 puffs into the lungs 2 (two) times daily.     chlorpheniramine 4 MG tablet  Commonly known as:  CHLOR-TRIMETON  Take 4 mg by mouth every 4 (four) hours as needed for allergies.     feeding supplement (NEPRO CARB STEADY) Liqd  Take 237 mLs by mouth 2 (two) times daily between meals.     ferrous sulfate 325 (65 FE) MG tablet  Take 1 tablet (325 mg total) by mouth daily with breakfast.     loperamide 2 MG tablet  Commonly known as:  IMODIUM A-D  Take 2 mg by mouth every 4 (four) hours as needed for diarrhea or loose stools.     magnesium oxide 400 MG tablet  Commonly known as:  MAG-OX  Take 400 mg by mouth at bedtime.     multivitamin Tabs tablet  Take 1 tablet by mouth daily.     pantoprazole 40 MG tablet  Commonly known as:  PROTONIX  Take 1 tablet (40 mg total) by mouth daily.     polyethylene glycol packet  Commonly known as:  MIRALAX / GLYCOLAX  Take 17 g by mouth daily.     sevelamer carbonate 800 MG tablet  Commonly known as:  RENVELA  Take  800 mg by mouth 3 (three) times daily with meals.     Tiotropium Bromide Monohydrate 2.5 MCG/ACT Aers  Inhale 1 puff into the lungs daily.     tiotropium 18 MCG inhalation capsule  Commonly known as:  SPIRIVA  Place 1 capsule (18 mcg total) into inhaler and inhale daily.        Allergies:  Allergies  Allergen Reactions  . Penicillins Hives, Itching and Swelling    Whelps, itching, and swelling  Family History: Family History  Problem Relation Age of Onset  . Stroke Mother     Social History:  reports that he has been smoking.  He has never used smokeless tobacco. He reports that he does not drink alcohol or use illicit drugs.   Physical Exam: BP 124/70 mmHg  Pulse 67  Ht 6\' 2"  (1.88 m)  Wt 215 lb (97.523 kg)  BMI 27.59 kg/m2  Constitutional:  Alert and oriented, No acute distress. HEENT: [place] AT, moist mucus membranes.  Trachea midline, no masses. Cardiovascular: No clubbing, cyanosis, or edema. Respiratory: Normal respiratory effort, no increased work of breathing. GI: Abdomen is soft, non tender, non distended, no abdominal masses GU: No CVA tenderness. Circumcised phallus.  Scrotum unremarkable.   Skin: No rashes, bruises or suspicious lesions. Neurologic: Grossly intact, no focal deficits, moving all 4 extremities. Psychiatric: Normal mood and affect.  Laboratory Data: Lab Results  Component Value Date   WBC 14.8* 03/25/2015   HGB 7.7* 03/25/2015   HCT 24.1* 03/25/2015   MCV 89.8 03/25/2015   PLT 232 03/25/2015    Lab Results  Component Value Date   CREATININE 6.44* 03/25/2015    Lab Results  Component Value Date   PSA 2.15 01/30/2015   PSA 0.8 09/05/2011    Lab Results  Component Value Date   TESTOSTERONE 28* 01/30/2015    No results found for: HGBA1C  Urinalysis Results for orders placed or performed in visit on 04/10/15  Microscopic Examination  Result Value Ref Range   WBC, UA >30 (A) 0 -  5 /hpf   RBC, UA 3-10 (A) 0 -  2 /hpf    Epithelial Cells (non renal) 0-10 0 - 10 /hpf   Bacteria, UA None seen None seen/Few  Urinalysis, Complete  Result Value Ref Range   Specific Gravity, UA 1.020 1.005 - 1.030   pH, UA 8.5 (H) 5.0 - 7.5   Color, UA Yellow Yellow   Appearance Ur Cloudy (A) Clear   Leukocytes, UA 3+ (A) Negative   Protein, UA 3+ (A) Negative/Trace   Glucose, UA Trace (A) Negative   Ketones, UA Negative Negative   RBC, UA 2+ (A) Negative   Bilirubin, UA Negative Negative   Urobilinogen, Ur 0.2 0.2 - 1.0 mg/dL   Nitrite, UA Negative Negative   Microscopic Examination See below:     Pertinent Imaging: ADDENDUM REPORT: 01/19/2015 16:52  ADDENDUM: When directly comparing to recent CT, the hydronephrosis in the right kidney is moderately improved, particularly in the interpolar region and lower pole collecting system. The hydronephrosis in the left kidney is mildly improved.  Notably, both kidneys are likely mildly improved when compared to CT dated 06/24/2014, indicating that the overall appearance is chronic.   Electronically Signed  By: Julian Hy M.D.  On: 01/19/2015 16:52      Study Result     CLINICAL DATA: Bilateral hydronephrosis  EXAM: RENAL / URINARY TRACT ULTRASOUND COMPLETE  COMPARISON: CT abdomen pelvis dated 01/18/2015  FINDINGS: Right Kidney:  Length: 11.7 cm. Moderate hydronephrosis. Indwelling ureteral stent.  Left Kidney:  Length: 11.9 cm. Moderate hydronephrosis. Indwelling ureteral stent.  Bladder:  Decompressed by indwelling Foley catheter.  IMPRESSION: Moderate hydronephrosis with indwelling ureteral stents bilaterally.  Bladder decompressed by indwelling Foley catheter.  Electronically Signed: By: Julian Hy M.D. On: 01/19/2015 16:23    Procedure:  Cystoscopy/ Stent removal procedure   Patient identification was confirmed, informed consent was obtained, and patient was prepped using Betadine solution.  Lidocaine  jelly was administered per urethral meatus.    Preoperative abx where received prior to procedure in the form of cipro/ gent.  Procedure: - Flexible cystoscope introduced, without any difficulty.   - Thorough search of the bladder revealed:    normal urethral meatus  Metal Stent seen emanating from bilateral ureteral orifice.  Right admitted metal stent could be seen emanating from right UO.  Even inability to grasp using traditional stent graspers, a Segura basket was used to entrap the distal coil of the metal stent.  The stent was removed then entirety without difficulty.  Moderate debris in bladder limiting visualization.  Post-Procedure: - Patient tolerated the procedure well   Assessment & Plan:    1. Recent hospitalization for sepsis caused by urinary tract infection- ? ureteral stents are nidus for infection s/p uncomplicated removal of 2nd ureteral stent today.  No clear indication for stents given patient is now on dialysis.  UA continues to be suspicious, most recent urine culture negative.  Suspect patient chronically colonized, currently asymptomatic.    2. Bladder carcinoma-  Patient has a history of bladder cancer as well.  Reviewing the records, it appears he underwent surveillance cystoscopy in November 2015. I do not believe any recurrence was noted.  There is a pathology report from 2013 which noted papillary urothelial carcinoma, low-grade.  Cysto limited again today due to debris, will need repeat surveillance cystoscopy.  Recommend cysto at next visit at time of Lupron shot.  3. 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 It was demonstrated he had metastatic disease on a CT scan and bone scan in 2012.  He has been receiving Lupron injections every 6 months. His last PSA was 2.15 on 01/30/2015, stable.  RTC when due to next Lupron (given last month).  PSA prior to next visit.    Return in about 5  months (around 09/10/2015) for Lupron injection, cysto.  Hollice Espy, MD  [facility] [address] [place], [place] [address] [phone]

## 2015-04-27 ENCOUNTER — Other Ambulatory Visit
Admission: RE | Admit: 2015-04-27 | Discharge: 2015-04-27 | Disposition: A | Discharge status: Home / Self Care | Payer: Medicare Other | Source: Other Acute Inpatient Hospital | Attending: Internal Medicine | Admitting: Internal Medicine

## 2015-04-27 DIAGNOSIS — N186 End stage renal disease: Secondary | ICD-10-CM | POA: Insufficient documentation

## 2015-04-27 LAB — POTASSIUM: POTASSIUM: 3.9 mmol/L (ref 3.5–5.1)

## 2015-05-01 ENCOUNTER — Ambulatory Visit: Payer: Medicare Other | Admitting: Cardiovascular Disease

## 2015-05-04 ENCOUNTER — Inpatient Hospital Stay
Admission: EM | Admit: 2015-05-04 | Discharge: 2015-05-08 | DRG: 683 | Disposition: A | Discharge status: Skilled Nursing Facility | Payer: Medicare Other | Attending: Internal Medicine | Admitting: Internal Medicine

## 2015-05-04 ENCOUNTER — Encounter: Payer: Self-pay | Admitting: *Deleted

## 2015-05-04 DIAGNOSIS — D631 Anemia in chronic kidney disease: Secondary | ICD-10-CM | POA: Diagnosis present

## 2015-05-04 DIAGNOSIS — L89302 Pressure ulcer of unspecified buttock, stage 2: Secondary | ICD-10-CM | POA: Diagnosis present

## 2015-05-04 DIAGNOSIS — N2581 Secondary hyperparathyroidism of renal origin: Secondary | ICD-10-CM | POA: Diagnosis present

## 2015-05-04 DIAGNOSIS — R197 Diarrhea, unspecified: Secondary | ICD-10-CM | POA: Diagnosis present

## 2015-05-04 DIAGNOSIS — D649 Anemia, unspecified: Secondary | ICD-10-CM | POA: Diagnosis present

## 2015-05-04 DIAGNOSIS — K219 Gastro-esophageal reflux disease without esophagitis: Secondary | ICD-10-CM | POA: Diagnosis present

## 2015-05-04 DIAGNOSIS — E876 Hypokalemia: Secondary | ICD-10-CM

## 2015-05-04 DIAGNOSIS — Z8551 Personal history of malignant neoplasm of bladder: Secondary | ICD-10-CM | POA: Diagnosis not present

## 2015-05-04 DIAGNOSIS — J438 Other emphysema: Secondary | ICD-10-CM

## 2015-05-04 DIAGNOSIS — F17200 Nicotine dependence, unspecified, uncomplicated: Secondary | ICD-10-CM

## 2015-05-04 DIAGNOSIS — Z992 Dependence on renal dialysis: Secondary | ICD-10-CM

## 2015-05-04 DIAGNOSIS — I5042 Chronic combined systolic (congestive) and diastolic (congestive) heart failure: Secondary | ICD-10-CM | POA: Diagnosis present

## 2015-05-04 DIAGNOSIS — I480 Paroxysmal atrial fibrillation: Secondary | ICD-10-CM | POA: Diagnosis present

## 2015-05-04 DIAGNOSIS — I120 Hypertensive chronic kidney disease with stage 5 chronic kidney disease or end stage renal disease: Secondary | ICD-10-CM | POA: Diagnosis present

## 2015-05-04 DIAGNOSIS — E46 Unspecified protein-calorie malnutrition: Secondary | ICD-10-CM | POA: Diagnosis present

## 2015-05-04 DIAGNOSIS — N186 End stage renal disease: Secondary | ICD-10-CM | POA: Diagnosis present

## 2015-05-04 DIAGNOSIS — Z880 Allergy status to penicillin: Secondary | ICD-10-CM | POA: Diagnosis not present

## 2015-05-04 DIAGNOSIS — Z8673 Personal history of transient ischemic attack (TIA), and cerebral infarction without residual deficits: Secondary | ICD-10-CM

## 2015-05-04 DIAGNOSIS — F0390 Unspecified dementia without behavioral disturbance: Secondary | ICD-10-CM | POA: Diagnosis present

## 2015-05-04 DIAGNOSIS — Z823 Family history of stroke: Secondary | ICD-10-CM

## 2015-05-04 DIAGNOSIS — L8990 Pressure ulcer of unspecified site, unspecified stage: Secondary | ICD-10-CM

## 2015-05-04 DIAGNOSIS — C61 Malignant neoplasm of prostate: Secondary | ICD-10-CM | POA: Diagnosis present

## 2015-05-04 DIAGNOSIS — R531 Weakness: Secondary | ICD-10-CM

## 2015-05-04 DIAGNOSIS — Z8679 Personal history of other diseases of the circulatory system: Secondary | ICD-10-CM | POA: Diagnosis not present

## 2015-05-04 DIAGNOSIS — J449 Chronic obstructive pulmonary disease, unspecified: Secondary | ICD-10-CM | POA: Diagnosis present

## 2015-05-04 DIAGNOSIS — F17210 Nicotine dependence, cigarettes, uncomplicated: Secondary | ICD-10-CM | POA: Diagnosis present

## 2015-05-04 LAB — CBC WITH DIFFERENTIAL/PLATELET
Basophils Absolute: 0 10*3/uL (ref 0–0.1)
Basophils Relative: 1 %
EOS PCT: 3 %
Eosinophils Absolute: 0.2 10*3/uL (ref 0–0.7)
HCT: 19.1 % — ABNORMAL LOW (ref 40.0–52.0)
Hemoglobin: 6.4 g/dL — ABNORMAL LOW (ref 13.0–18.0)
LYMPHS ABS: 1.5 10*3/uL (ref 1.0–3.6)
Lymphocytes Relative: 21 %
MCH: 29.8 pg (ref 26.0–34.0)
MCHC: 33.3 g/dL (ref 32.0–36.0)
MCV: 89.6 fL (ref 80.0–100.0)
MONO ABS: 0.6 10*3/uL (ref 0.2–1.0)
Monocytes Relative: 9 %
Neutro Abs: 4.6 10*3/uL (ref 1.4–6.5)
Neutrophils Relative %: 66 %
Platelets: 311 10*3/uL (ref 150–440)
RBC: 2.13 MIL/uL — ABNORMAL LOW (ref 4.40–5.90)
RDW: 16.4 % — ABNORMAL HIGH (ref 11.5–14.5)
WBC: 6.9 10*3/uL (ref 3.8–10.6)

## 2015-05-04 LAB — IRON AND TIBC
Iron: 23 ug/dL — ABNORMAL LOW (ref 45–182)
SATURATION RATIOS: 11 % — AB (ref 17.9–39.5)
TIBC: 218 ug/dL — ABNORMAL LOW (ref 250–450)
UIBC: 195 ug/dL

## 2015-05-04 LAB — BASIC METABOLIC PANEL
ANION GAP: 12 (ref 5–15)
BUN: 37 mg/dL — AB (ref 6–20)
CHLORIDE: 98 mmol/L — AB (ref 101–111)
CO2: 31 mmol/L (ref 22–32)
CREATININE: 6.81 mg/dL — AB (ref 0.61–1.24)
Calcium: 7.8 mg/dL — ABNORMAL LOW (ref 8.9–10.3)
GFR, EST AFRICAN AMERICAN: 8 mL/min — AB (ref >60)
GFR, EST NON AFRICAN AMERICAN: 7 mL/min — AB (ref >60)
Glucose, Bld: 137 mg/dL — ABNORMAL HIGH (ref 65–99)
Potassium: 3.1 mmol/L — ABNORMAL LOW (ref 3.5–5.1)
Sodium: 141 mmol/L (ref 135–145)

## 2015-05-04 LAB — VITAMIN B12: Vitamin B-12: 795 pg/mL (ref 180–914)

## 2015-05-04 LAB — FERRITIN: Ferritin: 838 ng/mL — ABNORMAL HIGH (ref 24–336)

## 2015-05-04 LAB — PREPARE RBC (CROSSMATCH)

## 2015-05-04 MED ORDER — MAGNESIUM OXIDE 400 (241.3 MG) MG PO TABS
400.0000 mg | ORAL_TABLET | Freq: Every evening | ORAL | Status: DC
  Administered 2015-05-04 – 2015-05-07 (×3): 400 mg via ORAL
  Filled 2015-05-04 (×4): qty 1

## 2015-05-04 MED ORDER — SEVELAMER CARBONATE 800 MG PO TABS
800.0000 mg | ORAL_TABLET | Freq: Three times a day (TID) | ORAL | Status: DC
Start: 2015-05-05 — End: 2015-05-08
  Administered 2015-05-05 – 2015-05-08 (×9): 800 mg via ORAL
  Filled 2015-05-04 (×8): qty 1

## 2015-05-04 MED ORDER — PANTOPRAZOLE SODIUM 40 MG PO TBEC
40.0000 mg | DELAYED_RELEASE_TABLET | Freq: Every day | ORAL | Status: DC
  Administered 2015-05-04 – 2015-05-08 (×5): 40 mg via ORAL
  Filled 2015-05-04 (×5): qty 1

## 2015-05-04 MED ORDER — ALBUTEROL SULFATE (2.5 MG/3ML) 0.083% IN NEBU: 2.5000 mg | INHALATION_SOLUTION | RESPIRATORY_TRACT | Status: DC | PRN

## 2015-05-04 MED ORDER — FERROUS SULFATE 325 (65 FE) MG PO TABS
325.0000 mg | ORAL_TABLET | Freq: Every day | ORAL | Status: DC
  Administered 2015-05-05 – 2015-05-08 (×4): 325 mg via ORAL
  Filled 2015-05-04 (×5): qty 1

## 2015-05-04 MED ORDER — ACETAMINOPHEN 650 MG RE SUPP: 650.0000 mg | Freq: Four times a day (QID) | RECTAL | Status: DC | PRN

## 2015-05-04 MED ORDER — ATORVASTATIN CALCIUM 20 MG PO TABS
40.0000 mg | ORAL_TABLET | Freq: Every day | ORAL | Status: DC
  Administered 2015-05-04 – 2015-05-08 (×5): 40 mg via ORAL
  Filled 2015-05-04 (×5): qty 2

## 2015-05-04 MED ORDER — AMLODIPINE BESYLATE 5 MG PO TABS
2.5000 mg | ORAL_TABLET | Freq: Every day | ORAL | Status: DC
  Administered 2015-05-04 – 2015-05-05 (×2): 2.5 mg via ORAL
  Administered 2015-05-06: 19:00:00 5 mg via ORAL
  Administered 2015-05-07 – 2015-05-08 (×2): 2.5 mg via ORAL
  Filled 2015-05-04 (×5): qty 1

## 2015-05-04 MED ORDER — BUDESONIDE-FORMOTEROL FUMARATE 160-4.5 MCG/ACT IN AERO
2.0000 | INHALATION_SPRAY | Freq: Two times a day (BID) | RESPIRATORY_TRACT | Status: DC
  Administered 2015-05-04 – 2015-05-08 (×7): 2 via RESPIRATORY_TRACT
  Filled 2015-05-04: qty 6

## 2015-05-04 MED ORDER — ACETAMINOPHEN 325 MG PO TABS: 650.0000 mg | ORAL_TABLET | Freq: Four times a day (QID) | ORAL | Status: DC | PRN

## 2015-05-04 MED ORDER — NEPRO/CARBSTEADY PO LIQD: 237.0000 mL | Freq: Two times a day (BID) | ORAL | Status: DC

## 2015-05-04 MED ORDER — FUROSEMIDE 10 MG/ML IJ SOLN
80.0000 mg | Freq: Once | INTRAMUSCULAR | Status: AC
  Administered 2015-05-04: 23:00:00 80 mg via INTRAVENOUS
  Filled 2015-05-04: qty 8

## 2015-05-04 MED ORDER — SODIUM CHLORIDE 0.9 % IV SOLN
10.0000 mL/h | Freq: Once | INTRAVENOUS | Status: AC
  Administered 2015-05-04: 10 mL/h via INTRAVENOUS

## 2015-05-04 MED ORDER — AMIODARONE HCL 200 MG PO TABS
200.0000 mg | ORAL_TABLET | Freq: Every day | ORAL | Status: DC
  Administered 2015-05-04 – 2015-05-08 (×5): 200 mg via ORAL
  Filled 2015-05-04 (×5): qty 1

## 2015-05-04 MED ORDER — DIPHENHYDRAMINE HCL 25 MG PO CAPS
25.0000 mg | ORAL_CAPSULE | Freq: Once | ORAL | Status: AC
  Administered 2015-05-04: 25 mg via ORAL
  Filled 2015-05-04: qty 1

## 2015-05-04 MED ORDER — TIOTROPIUM BROMIDE MONOHYDRATE 18 MCG IN CAPS
18.0000 ug | ORAL_CAPSULE | Freq: Every day | RESPIRATORY_TRACT | Status: DC
  Administered 2015-05-04 – 2015-05-08 (×4): 18 ug via RESPIRATORY_TRACT
  Filled 2015-05-04: qty 5

## 2015-05-04 NOTE — ED Notes (Signed)
Per patient's family (POA), patient had labs drawn on Tuesday 8/9 at dialysis and had an hemoglobin of 6.? Family wasn't sure about the exact lab value. Family states that patient's PMD called and stated that patient needed to go to the ED for a transfusion.

## 2015-05-04 NOTE — H&P (Addendum)
[facility] [place] NAME: [name]    MR#:  [id number]  DATE OF BIRTH:  May 07, 1941  DATE OF ADMISSION:  05/04/2015  PRIMARY CARE PHYSICIAN: Dion Body, MD   REQUESTING/REFERRING PHYSICIAN: Dr. Archie Balboa  CHIEF COMPLAINT:   Chief Complaint  Patient presents with  . Abnormal Lab    HISTORY OF PRESENT ILLNESS:  [name]  is a 74 y.o. male with a known history of end-stage renal disease, anemia, atrial fibrillation. Dr. Anthonette Legato nephrologist sent off labs and his hemoglobin was less than 7. The patient missed dialysis today because he wasn't feeling up to it, he just felt warn out. In the ER, his hemoglobin was found to be 6.4 and hospitalist services were contacted for admission. The patient states that he's had a colonoscopy within the last few years. In the ER, he was guaiac negative on rectal exam by ER physician. He has not seen any blood loss in the urine or bowel movements. He has had a history of receiving transfusions in the past. No complaints of chest pain. But he does have shortness of breath.   PAST MEDICAL HISTORY:   Past Medical History  Diagnosis Date  . PAF (paroxysmal atrial fibrillation)     a. new onset 12/2014 in the setting of UTI, sepsis, hypotension, and anemia; b. not on long term anticoagulation given anemia; c. family aware of stroke risk, they are ok with this; d. on amiodarone   . Chronic combined systolic and diastolic CHF (congestive heart failure)     a. echo 12/2014: EF 25-30%, anterior wall wall motion abnormalities; b. planned ischemic evaluation once patient is stable medically; c. echo 01/2015: EF 45-50%, no RWMA, mild AT, LA mildly dilated, mod pericardial effusion along LV free wall, no evidence of hemodynamic compromise  . ESRD on hemodialysis     a. Tuesday, Thursday, and Saturdays  . Anemia     a. baseline hgb ~ 8  . History of small bowel obstruction     a. 01/2015   . History of septic shock     a. 01/2015  . GERD (gastroesophageal reflux disease)   . Allergic rhinitis   . COPD (chronic obstructive pulmonary disease)   . Hypercholesteremia   . Cognitive communication deficit   . Magnesium deficiency   . Dementia   . Iliac aneurysm   . Incontinence   . Hydronephrosis   . Overactive bladder   . Detrusor sphincter dyssynergia   . Mini stroke   . Bladder cancer   . Prostate cancer   . Renal insufficiency   . Hypertension     PAST SURGICAL HISTORY:   Past Surgical History  Procedure Laterality Date  . Cystoscopy w/ ureteral stent placement    . Transurethral resection of bladder tumor with gyrus (turbt-gyrus)    . Ureteral stent placement    . Prostate surgery      Removal  . Av fistula placement      left arm    SOCIAL HISTORY:   Social History  Substance Use Topics  . Smoking status: Light Tobacco Smoker -- 0.25 packs/day for 56 years    Last Attempt to Quit: 01/11/2015  . Smokeless tobacco: Never Used  . Alcohol Use: No    FAMILY HISTORY:   Family History  Problem Relation Age of Onset  . Stroke Mother     DRUG ALLERGIES:   Allergies  Allergen Reactions  . Penicillins Hives, Itching  and Swelling    REVIEW OF SYSTEMS:  CONSTITUTIONAL: No fever,  positive for fatigue.  EYES: No blurred or double vision. Needs glasses  EARS, NOSE, AND THROAT: No tinnitus or ear pain. No sore throat. Positive for runny nose  RESPIRATORY: No cough,  positive for shortness of breath,  no wheezing or hemoptysis.  CARDIOVASCULAR: No chest pain, orthopnea, edema.  GASTROINTESTINAL: No nausea, vomiting, or abdominal pain. No blood in bowel movements. Does have alternating diarrhea and constipation.  GENITOURINARY: No dysuria, hematuria.  ENDOCRINE: No polyuria, nocturia,  HEMATOLOGY:  history of anemia,  no easy bruising or bleeding SKIN: No rash or lesion. MUSCULOSKELETAL: No joint pain or arthritis.   NEUROLOGIC: No tingling,  numbness, weakness.  PSYCHIATRY: No anxiety or depression.   MEDICATIONS AT HOME:   Prior to Admission medications   Medication Sig Start Date End Date Taking? Authorizing Provider  albuterol (PROVENTIL HFA;VENTOLIN HFA) 108 (90 BASE) MCG/ACT inhaler Inhale 2 puffs into the lungs every 4 (four) hours as needed for wheezing or shortness of breath.   Yes Historical Provider, MD  amiodarone (PACERONE) 200 MG tablet Take 1 tablet (200 mg total) by mouth daily. 02/27/15  Yes Wellington Hampshire, MD  amLODipine (NORVASC) 2.5 MG tablet Take 2.5 mg by mouth daily.   Yes Historical Provider, MD  atorvastatin (LIPITOR) 40 MG tablet Take 1 tablet (40 mg total) by mouth daily. 02/03/15  Yes Fritzi Mandes, MD  budesonide-formoterol (SYMBICORT) 160-4.5 MCG/ACT inhaler Inhale 2 puffs into the lungs 2 (two) times daily.   Yes Historical Provider, MD  ferrous sulfate 325 (65 FE) MG tablet Take 1 tablet (325 mg total) by mouth daily with breakfast. 03/27/15  Yes Srikar Sudini, MD  magnesium oxide (MAG-OX) 400 MG tablet Take 400 mg by mouth every evening.    Yes Historical Provider, MD  pantoprazole (PROTONIX) 40 MG tablet Take 1 tablet (40 mg total) by mouth daily. 02/03/15  Yes Fritzi Mandes, MD  sevelamer carbonate (RENVELA) 800 MG tablet Take 800 mg by mouth 3 (three) times daily before meals.    Yes Historical Provider, MD  tiotropium (SPIRIVA) 18 MCG inhalation capsule Place 1 capsule (18 mcg total) into inhaler and inhale daily. 02/17/15  Yes Dustin Flock, MD  Nutritional Supplements (FEEDING SUPPLEMENT, NEPRO CARB STEADY,) LIQD Take 237 mLs by mouth 2 (two) times daily between meals. Patient not taking: Reported on 05/04/2015 02/03/15   Fritzi Mandes, MD  polyethylene glycol [facility] / GLYCOLAX) packet Take 17 g by mouth daily. Patient not taking: Reported on 04/10/2015 03/27/15   Hillary Bow, MD      VITAL SIGNS:  Blood pressure 100/59, pulse 63, temperature 98.3 F (36.8 C), temperature source Oral, resp. rate 23,  height 6\' 2"  (1.88 m), weight 99.791 kg (220 lb), SpO2 100 %.  PHYSICAL EXAMINATION:  GENERAL:  74 y.o.-year-old patient lying in the bed with no acute distress.  EYES: Pupils equal, round, reactive to light and accommodation. No scleral icterus. Extraocular muscles intact. Conjunctiva pale.  HEENT: Head atraumatic, normocephalic. Oropharynx and nasopharynx clear.  NECK:  Supple, no jugular venous distention. No thyroid enlargement, no tenderness.  LUNGS: Normal breath sounds bilaterally, no wheezing, rales,rhonchi or crepitation. No use of accessory muscles of respiration.  CARDIOVASCULAR: S1, S2 normal. No murmurs, rubs, or gallops.  ABDOMEN: Soft, nontender, nondistended. Bowel sounds present. No organomegaly or mass.  EXTREMITIES: Trace  edema,  no cyanosis, or clubbing.  NEUROLOGIC: Cranial nerves II through XII are intact. Muscle strength 5/5 in  all extremities. Sensation intact. Gait not checked.  PSYCHIATRIC: The patient is alert and oriented x 3.  SKIN: No rash, lesion, or ulcer.   LABORATORY PANEL:   CBC  Recent Labs Lab 05/04/15 1558  WBC 6.9  HGB 6.4*  HCT 19.1*  PLT 311    Chemistries   Recent Labs Lab 05/04/15 1558  NA 141  K 3.1*  CL 98*  CO2 31  GLUCOSE 137*  BUN 37*  CREATININE 6.81*  CALCIUM 7.8*    EKG:   Normal sinus rhythm 69 bpm no acute ST-T wave changes.  IMPRESSION AND PLAN:   1. Symptomatic anemia. I will transfuse 1 unit of packed red blood cells with Lasix prior. Check a hemoglobin in the a.m. and if still low can give another unit of blood. I added on iron studies prior to the blood transfusion. Looking back at old hemoglobins he has been anemic for a long period of time so this is likely anemia of chronic disease. Procrit can be given with dialysis. Since the patient was guaiac-negative I will hold off on GI workup. The patient did have a colonoscopy in 2012 that showed diverticuli. Continue Protonix for gastroesophageal reflux  disease. 2. End-stage renal disease on hemodialysis Tuesday Thursday and Saturday at [facility] on [address]. He missed dialysis today. I spoke with Dr. Candiss Norse to see the patient tomorrow and set up dialysis area and no urgent need for dialysis tonight. 3. COPD- respiratory status stable. Continue inhalers. 4. Hyperlipidemia unspecified- continue atorvastatin. 5. History of atrial fibrillation- on amiodarone. 6. Tobacco abuse smoking cessation counseling done 3 minutes by me. Since he cut back to 3 cigarettes a day no need nicotine replacement.  All the records are reviewed and case discussed with ED provider. Management plans discussed with the patient, family and they are in agreement.  CODE STATUS: Full code  TOTAL TIME TAKING CARE OF THIS PATIENT: 55  minutes.    Loletha Grayer M.D on 05/04/2015 at 7:20 PM  Between 7am to 6pm - Pager - [phone]  After 6pm call admission pager [facility]  Office  [phone]  CC: Primary care physician; Dion Body, MD

## 2015-05-04 NOTE — ED Provider Notes (Signed)
[facility] Emergency Department Provider Note    ____________________________________________  Time seen: 1615  I have reviewed the triage vital signs and the nursing notes.   HISTORY  Chief Complaint Abnormal Lab   History limited by: Not Limited   HPI [name] is a 74 y.o. male who presents to the emergency department today because of concerns for anemia. Patient states he had blood work drawn 2 days ago and was told that he needed to come to the emergency department. States that he thinks his hemoglobin was in the 6. Patient states that he has had anemia requiring transfusions in the past. He states that he did not get a dialysis today because he came for his anemia. Denies any chest pain.   Past Medical History  Diagnosis Date  . PAF (paroxysmal atrial fibrillation)     a. new onset 12/2014 in the setting of UTI, sepsis, hypotension, and anemia; b. not on long term anticoagulation given anemia; c. family aware of stroke risk, they are ok with this; d. on amiodarone   . Chronic combined systolic and diastolic CHF (congestive heart failure)     a. echo 12/2014: EF 25-30%, anterior wall wall motion abnormalities; b. planned ischemic evaluation once patient is stable medically; c. echo 01/2015: EF 45-50%, no RWMA, mild AT, LA mildly dilated, mod pericardial effusion along LV free wall, no evidence of hemodynamic compromise  . ESRD on hemodialysis     a. Tuesday, Thursday, and Saturdays  . Anemia     a. baseline hgb ~ 8  . History of small bowel obstruction     a. 01/2015  . History of septic shock     a. 01/2015  . GERD (gastroesophageal reflux disease)   . Allergic rhinitis   . COPD (chronic obstructive pulmonary disease)   . Hypercholesteremia   . Cognitive communication deficit   . Magnesium deficiency   . Dementia   . Iliac aneurysm   . Incontinence   . Hydronephrosis   . Overactive bladder   . Detrusor sphincter dyssynergia   . Mini  stroke   . Bladder cancer   . Prostate cancer   . Renal insufficiency   . Hypertension     Patient Active Problem List   Diagnosis Date Noted  . CAFL (chronic airflow limitation) 04/10/2015  . Malnutrition of moderate degree 03/22/2015  . Abdominal pain   . Pneumonia 03/20/2015  . History of sepsis 02/27/2015  . Incontinence 02/27/2015  . Persistent atrial fibrillation 02/27/2015  . Preoperative cardiovascular examination 02/27/2015  . GI bleed 02/13/2015  . Ileus   . Chronic combined systolic and diastolic CHF (congestive heart failure)   . ESRD on hemodialysis   . Anemia   . History of small bowel obstruction   . Atrial fibrillation with RVR 01/21/2015  . Chronic kidney disease (CKD), stage IV (severe) 03/01/2014  . Essential (primary) hypertension 03/01/2014  . BP (high blood pressure) 03/01/2014  . 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 Absence of bladder continence 03/01/2014  . Chronic kidney disease requiring chronic dialysis 03/01/2014  . Bladder compliance low 11/11/2012  . Cystitis 11/11/2012  . Acute cystitis 10/15/2012  . AAA (abdominal aortic aneurysm) 08/27/2012  . Aneurysm artery, iliac 08/27/2012  . Chronic kidney disease (CKD), stage V 08/27/2012  . Hydronephrosis 08/27/2012  . Incomplete bladder emptying 08/27/2012  . Bladder cancer 08/27/2012  . CA of prostate 08/27/2012  . Bone metastases 08/27/2012  . Infection of urinary tract 08/27/2012  Past Surgical History  Procedure Laterality Date  . Cystoscopy w/ ureteral stent placement    . Transurethral resection of bladder tumor with gyrus (turbt-gyrus)    . Ureteral stent placement    . Prostate surgery      Removal  . Av fistula placement      left arm    Current Outpatient Rx  Name  Route  Sig  Dispense  Refill  . amiodarone (PACERONE) 200 MG tablet   Oral   Take 1 tablet (200 mg total) by mouth daily.   30 tablet   5   . amLODipine (NORVASC) 2.5 MG tablet               .  atorvastatin (LIPITOR) 40 MG tablet   Oral   Take 1 tablet (40 mg total) by mouth daily.   30 tablet   1   . budesonide-formoterol (SYMBICORT) 160-4.5 MCG/ACT inhaler   Inhalation   Inhale 2 puffs into the lungs 2 (two) times daily.         . chlorpheniramine (CHLOR-TRIMETON) 4 MG tablet   Oral   Take 4 mg by mouth every 4 (four) hours as needed for allergies.         . ferrous sulfate 325 (65 FE) MG tablet   Oral   Take 1 tablet (325 mg total) by mouth daily with breakfast.   90 tablet   0   . loperamide (IMODIUM A-D) 2 MG tablet   Oral   Take 2 mg by mouth every 4 (four) hours as needed for diarrhea or loose stools.         . magnesium oxide (MAG-OX) 400 MG tablet   Oral   Take 400 mg by mouth at bedtime.         . multivitamin (RENA-VIT) TABS tablet   Oral   Take 1 tablet by mouth daily.         . Nutritional Supplements (FEEDING SUPPLEMENT, NEPRO CARB STEADY,) LIQD   Oral   Take 237 mLs by mouth 2 (two) times daily between meals.   30 Can   0   . pantoprazole (PROTONIX) 40 MG tablet   Oral   Take 1 tablet (40 mg total) by mouth daily.   30 tablet   0   . polyethylene glycol (MIRALAX / GLYCOLAX) packet   Oral   Take 17 g by mouth daily. Patient not taking: Reported on 04/10/2015   14 each   0   . sevelamer carbonate (RENVELA) 800 MG tablet   Oral   Take 800 mg by mouth 3 (three) times daily with meals.          . tiotropium (SPIRIVA) 18 MCG inhalation capsule   Inhalation   Place 1 capsule (18 mcg total) into inhaler and inhale daily.   30 capsule   12   . Tiotropium Bromide Monohydrate 2.5 MCG/ACT AERS   Inhalation   Inhale 1 puff into the lungs daily.           Allergies Penicillins  Family History  Problem Relation Age of Onset  . Stroke Mother     Social History Social History  Substance Use Topics  . Smoking status: Light Tobacco Smoker -- 0.25 packs/day for 56 years    Last Attempt to Quit: 01/11/2015  . Smokeless  tobacco: Never Used  . Alcohol Use: No    Review of Systems  Constitutional: Negative for fever. Cardiovascular: Negative for chest pain. Respiratory:  Negative for shortness of breath. Gastrointestinal: Negative for abdominal pain, vomiting and diarrhea. Genitourinary: Negative for dysuria. Musculoskeletal: Negative for back pain. Skin: Negative for rash. Neurological: Negative for headaches, focal weakness or numbness.   10-point ROS otherwise negative.  ____________________________________________   PHYSICAL EXAM:  VITAL SIGNS: ED Triage Vitals  Enc Vitals Group     BP 05/04/15 1540 121/77 mmHg     Pulse Rate 05/04/15 1540 76     Resp 05/04/15 1540 38     Temp 05/04/15 1540 98.3 F (36.8 C)     Temp Source 05/04/15 1540 Oral     SpO2 05/04/15 1540 100 %     Weight 05/04/15 1540 220 lb (99.791 kg)     Height 05/04/15 1540 6\' 2"  (1.88 m)   Constitutional: Alert and oriented. Well appearing and in no distress. Eyes: Conjunctivae are normal. PERRL. Normal extraocular movements. ENT   Head: Normocephalic and atraumatic.   Nose: No congestion/rhinnorhea.   Mouth/Throat: Mucous membranes are moist.   Neck: No stridor. Hematological/Lymphatic/Immunilogical: No cervical lymphadenopathy. Cardiovascular: Normal rate, regular rhythm.  No murmurs, rubs, or gallops. Respiratory: Normal respiratory effort without tachypnea nor retractions. Breath sounds are clear and equal bilaterally. No wheezes/rales/rhonchi. Gastrointestinal: Soft and nontender. No distention.  Rectal: GUIAC negative. Brown stool on glove.  Musculoskeletal: Normal range of motion in all extremities. No joint effusions.  No lower extremity tenderness nor edema. Neurologic:  Normal speech and language. No gross focal neurologic deficits are appreciated. Speech is normal.  Skin:  Skin is warm, dry and intact. No rash noted. Psychiatric: Mood and affect are normal. Speech and behavior are normal.  Patient exhibits appropriate insight and judgment.  ____________________________________________    LABS (pertinent positives/negatives)  Labs Reviewed  CBC WITH DIFFERENTIAL/PLATELET - Abnormal; Notable for the following:    RBC 2.13 (*)    Hemoglobin 6.4 (*)    HCT 19.1 (*)    RDW 16.4 (*)    All other components within normal limits  BASIC METABOLIC PANEL - Abnormal; Notable for the following:    Potassium 3.1 (*)    Chloride 98 (*)    Glucose, Bld 137 (*)    BUN 37 (*)    Creatinine, Ser 6.81 (*)    Calcium 7.8 (*)    GFR calc non Af Amer 7 (*)    GFR calc Af Amer 8 (*)    All other components within normal limits  FERRITIN - Abnormal; Notable for the following:    Ferritin 838 (*)    All other components within normal limits  IRON AND TIBC - Abnormal; Notable for the following:    Iron 23 (*)    TIBC 218 (*)    Saturation Ratios 11 (*)    All other components within normal limits  OCCULT BLOOD X 1 CARD TO LAB, STOOL  BASIC METABOLIC PANEL  CBC  VITAMIN B12  PREPARE RBC (CROSSMATCH)  TYPE AND SCREEN     ____________________________________________   EKG  I, Nance Pear, attending physician, personally viewed and interpreted this EKG  EKG Time: 1620 Rate: 69 Rhythm: NSR Axis: left axis deviation Intervals: qtc 387 QRS: narrow ST changes: no st elevation  ____________________________________________    RADIOLOGY  None  ____________________________________________   PROCEDURES  Procedure(s) performed: None  Critical Care performed: Yes, see critical care note(s)  CRITICAL CARE Performed by: Nance Pear   Total critical care time: 30  Critical care time was exclusive of separately billable procedures and treating other  patients.  Critical care was necessary to treat or prevent imminent or life-threatening deterioration.  Critical care was time spent personally by me on the following activities: development of treatment plan  with patient and/or surrogate as well as nursing, discussions with consultants, evaluation of patient's response to treatment, examination of patient, obtaining history from patient or surrogate, ordering and performing treatments and interventions, ordering and review of laboratory studies, ordering and review of radiographic studies, pulse oximetry and re-evaluation of patient's condition.  ____________________________________________   INITIAL IMPRESSION / ASSESSMENT AND PLAN / ED COURSE  Pertinent labs & imaging results that were available during my care of the patient were reviewed by me and considered in my medical decision making (see chart for details).  Patient presented to the emergency department because of concerns for anemia. Blood work here did show the patient was significantly anemic. Patient vital signs however are stable. Given his level anemia though will plan on transfusion and admission to the hospital. Guaiac was negative.  ____________________________________________   FINAL CLINICAL IMPRESSION(S) / ED DIAGNOSES  Final diagnoses:  Anemia, unspecified anemia type     Nance Pear, MD 05/04/15 2257

## 2015-05-05 DIAGNOSIS — L8990 Pressure ulcer of unspecified site, unspecified stage: Secondary | ICD-10-CM

## 2015-05-05 LAB — CBC
HEMATOCRIT: 20.6 % — AB (ref 40.0–52.0)
Hemoglobin: 6.8 g/dL — ABNORMAL LOW (ref 13.0–18.0)
MCH: 29.5 pg (ref 26.0–34.0)
MCHC: 33.1 g/dL (ref 32.0–36.0)
MCV: 89.2 fL (ref 80.0–100.0)
Platelets: 295 10*3/uL (ref 150–440)
RBC: 2.31 MIL/uL — ABNORMAL LOW (ref 4.40–5.90)
RDW: 15.8 % — ABNORMAL HIGH (ref 11.5–14.5)
WBC: 6.9 10*3/uL (ref 3.8–10.6)

## 2015-05-05 LAB — BASIC METABOLIC PANEL
ANION GAP: 10 (ref 5–15)
BUN: 44 mg/dL — ABNORMAL HIGH (ref 6–20)
CALCIUM: 7.6 mg/dL — AB (ref 8.9–10.3)
CO2: 31 mmol/L (ref 22–32)
Chloride: 101 mmol/L (ref 101–111)
Creatinine, Ser: 6.99 mg/dL — ABNORMAL HIGH (ref 0.61–1.24)
GFR calc Af Amer: 8 mL/min — ABNORMAL LOW (ref >60)
GFR, EST NON AFRICAN AMERICAN: 7 mL/min — AB (ref >60)
GLUCOSE: 124 mg/dL — AB (ref 65–99)
POTASSIUM: 3.1 mmol/L — AB (ref 3.5–5.1)
Sodium: 142 mmol/L (ref 135–145)

## 2015-05-05 LAB — MRSA PCR SCREENING: MRSA by PCR: NEGATIVE

## 2015-05-05 LAB — PREPARE RBC (CROSSMATCH)

## 2015-05-05 MED ORDER — NEPRO/CARBSTEADY PO LIQD: 237.0000 mL | Freq: Two times a day (BID) | ORAL | Status: DC

## 2015-05-05 MED ORDER — NICOTINE 10 MG IN INHA
1.0000 | RESPIRATORY_TRACT | Status: DC | PRN
  Filled 2015-05-05: qty 36

## 2015-05-05 MED ORDER — SODIUM CHLORIDE 0.9 % IV SOLN: Freq: Once | INTRAVENOUS | Status: DC

## 2015-05-05 MED ORDER — POTASSIUM CHLORIDE CRYS ER 20 MEQ PO TBCR
20.0000 meq | EXTENDED_RELEASE_TABLET | Freq: Once | ORAL | Status: DC
  Filled 2015-05-05: qty 1

## 2015-05-05 NOTE — Plan of Care (Addendum)
Problem: Discharge Progression Outcomes Goal: Discharge plan in place and appropriate Outcome: Progressing Individualization of Care Hx of Bladder and Prostate Cancer, CHF, ERSD on hemodialysis, anemia, HTN, small bowel obstructions, sepsis, GERD, COPD, dementia, incontinence.  Admitted for symptomatic anemia.      1.  Treatment Plan:  1 unit of PRBC transfused this shift.  Plan to check Hgb in the morning.  If continues to be low, orders to transfuse an additional unit.   2.  Pain:  Patient without complaints of pain this shift. 3.  Hemodynamics:  Patient tolerated transfusion well.  BP 116/65 mmHg  Pulse 65  Temp(Src) 97.6 F (36.4 C) (Oral)  Resp 18  Ht 6\' 2"  (1.88 m)  Wt 220 lb (99.791 kg)  BMI 28.23 kg/m2  SpO2 99% 4.  Diet:  Patient on renal diet with fluid restriction of 1200 mL.  Patient acknowledged diet plan and did well with fluid restriction.  He had sandwich plate upon arrival to unit and tolerated well. 5.  Complications:  Patient has dialysis Tuesdays, Thursdays and Saturdays.  He missed dialysis today due to low Hgb.  Physician aware. 6.  Activity:  Patient unsteady on feet with generalized weakness.  In bed this shift with active ROM performed.   7.  Discharge:  Patient from private home.  Patient and family verbalized the need for patient to be discharged to a nursing care facility due to his inability perform ADLs.

## 2015-05-05 NOTE — Progress Notes (Signed)
PRE HD   05/05/15 2000  Vital Signs  Temp 98.6 F (37 C)  Temp Source Axillary  Pulse Rate 69  Pulse Rate Source Monitor  Resp (!) 22  BP 130/64 mmHg  BP Location Right Arm  BP Method Automatic  Patient Position (if appropriate) Lying  Oxygen Therapy  SpO2 99 %  O2 Device Room Air  Pain Assessment  Pain Assessment No/denies pain  Pain Score 0  Dialysis Weight  Weight 95.2 kg (209 lb 14.1 oz)  Type of Weight Pre-Dialysis  Time-Out for Hemodialysis  What Procedure? HEMODIALYSIS  Pt Identifiers(min of two) First/Last Name;MRN/Account#  Correct Site? Yes  Correct Side? Yes  Correct Procedure? Yes  Consents Verified? Yes  Rad Studies Available? N/A  Safety Precautions Reviewed? Yes  Engineer, civil (consulting) Number [phone]  UF/Alarm Test Passed  Conductivity: Meter 13.9  Conductivity: Machine  14  pH 7.4  Reverse Osmosis 5705  Dialyzer Lot Number [id number]  Disposable Set Lot Number [id number]  Machine Temperature 98.6 F (37 C)  Musician and Audible Yes  Blood Lines Intact and Secured Yes  Pre Treatment Patient Checks  Vascular access used during treatment Fistula  Hemodialysis Consent Verified Yes  Hemodialysis Standing Orders Initiated Yes  ECG (Telemetry) Monitor On Yes  Prime Ordered Normal Saline  Length of  DialysisTreatment -hour(s) 3 Hour(s)  Dialysis Treatment Comments GOAL TO REMOVE 0.5L OVER 3 HOURS.  15G NEEDLES INSERTED WITHOUT DIFFICULT.  PT ALERT AND VS STABLE.    Dialyzer Optiflux 180 NR  Dialysate Other (Comment) (3K2.5CA)  Dialysis Anticoagulant None  Dialysate Flow Ordered 600  Blood Flow Rate Ordered 400 mL/min  Ultrafiltration Goal 0.5 Liters  Pre Treatment Labs Renal panel;Hepatitis B Surface Antigen;CBC;Phosphorus  Blood Products Ordered Packed Red Blood Cells  Dialysis Blood Pressure Support Ordered Normal Saline

## 2015-05-05 NOTE — Plan of Care (Signed)
Problem: Discharge Progression Outcomes Goal: Other Discharge Outcomes/Goals Plan of care progress to goal for:  1. Discharge Plan:         Patient from Home.          Patient and family Concerns for possible need to be discharged to a          nursing care facility due to inability to perform ADLs.     2. Pain:         Denies Pain. 3. Hemodynamically Stable:         VSS. 4. Complications:         Dialysis Schedule Tuesdays, Thursdays and Saturdays.           Dialysis scheduled today, but has not received Dialysis since Tuesday r/t Decreased Hgb.         1 Unit PRBC's to be given during Diaysis. 5. Diet:         Renal Diet. Decreased Appetite. Tolerating Well.         Fluid Restriction of 1267mL.   6. Activity:        Generalized Weakness.          Unable to participate in PT.

## 2015-05-05 NOTE — Progress Notes (Signed)
HD START 

## 2015-05-05 NOTE — Progress Notes (Signed)
Initial Nutrition Assessment   INTERVENTION:   Meals and Snacks: Cater to patient preferences Medical Food Supplement Therapy: Continue Nepro Shake po BID (each supplement provides 425 kcal and 19 grams protein) as ordered as pt reports drinking them at times PTA   NUTRITION DIAGNOSIS:   Increased nutrient needs related to chronic illness, wound healing as evidenced by estimated needs.  GOAL:   Patient will meet greater than or equal to 90% of their needs  MONITOR:    (Energy Intake, Anthropometrics, UOP, Electrolyte and renal Profile)  REASON FOR ASSESSMENT:    (Renal diet)    ASSESSMENT:    Pt admitted with low hemoglobin, s/p 1 unit pRBC transfusion. Pt with h/o ESRD on HD.  Past Medical History  Diagnosis Date  . PAF (paroxysmal atrial fibrillation)     a. new onset 12/2014 in the setting of UTI, sepsis, hypotension, and anemia; b. not on long term anticoagulation given anemia; c. family aware of stroke risk, they are ok with this; d. on amiodarone   . Chronic combined systolic and diastolic CHF (congestive heart failure)     a. echo 12/2014: EF 25-30%, anterior wall wall motion abnormalities; b. planned ischemic evaluation once patient is stable medically; c. echo 01/2015: EF 45-50%, no RWMA, mild AT, LA mildly dilated, mod pericardial effusion along LV free wall, no evidence of hemodynamic compromise  . ESRD on hemodialysis     a. Tuesday, Thursday, and Saturdays  . Anemia     a. baseline hgb ~ 8  . History of small bowel obstruction     a. 01/2015  . History of septic shock     a. 01/2015  . GERD (gastroesophageal reflux disease)   . Allergic rhinitis   . COPD (chronic obstructive pulmonary disease)   . Hypercholesteremia   . Cognitive communication deficit   . Magnesium deficiency   . Dementia   . Iliac aneurysm   . Incontinence   . Hydronephrosis   . Overactive bladder   . Detrusor sphincter dyssynergia   . Mini stroke   . Bladder cancer   . Prostate  cancer   . Renal insufficiency   . Hypertension      Diet Order:  Diet renal with fluid restriction Fluid restriction:: 1200 mL Fluid; Room service appropriate?: Yes; Fluid consistency:: Thin    Current Nutrition: RD aided in delivery of breakfast tray and set-up this am for breakfast. Pt reports eating [place] tray last night once admitted to floor.  Food/Nutrition-Related History: Pt reports a good appetite PTA eating 3 meals per day, Nepro supplements off and on.   Medications: Renvela, Mag-Ox, Protonix  Electrolyte/Renal Profile and Glucose Profile:   Recent Labs Lab 05/04/15 1558 05/05/15 0518  NA 141 142  K 3.1* 3.1*  CL 98* 101  CO2 31 31  BUN 37* 44*  CREATININE 6.81* 6.99*  CALCIUM 7.8* 7.6*  GLUCOSE 137* 124*   Protein Profile: No results for input(s): ALBUMIN in the last 168 hours.  Gastrointestinal Profile: WDL Last BM: unknown   Nutrition-Focused Physical Exam Findings: Nutrition-Focused physical exam completed. Findings are WDL for fat depletion, muscle depletion, and edema.    Weight Change: Pt not sure of usual dry weight but thinks his weight is 'back up.'   Skin:   (Stage II buttock pressure ulcer)  Height:   Ht Readings from Last 1 Encounters:  05/04/15 6\' 2"  (1.88 m)    Weight:   Wt Readings from Last 1 Encounters:  05/04/15 220  lb (99.791 kg)    Wt Readings from Last 10 Encounters:  05/04/15 220 lb (99.791 kg)  04/10/15 215 lb (97.523 kg)  03/25/15 202 lb 9.6 oz (91.9 kg)  03/18/15 210 lb (95.255 kg)  03/09/15 207 lb (93.895 kg)  02/27/15 209 lb (94.802 kg)  02/27/15 209 lb (94.802 kg)  02/16/15 199 lb 15.3 oz (90.7 kg)  02/08/15 211 lb (95.709 kg)  02/04/15 206 lb 12.7 oz (93.8 kg)    BMI:  Body mass index is 28.23 kg/(m^2).  Estimated Nutritional Needs:   Kcal:  2375-2807kcals, BEE: 1800kcals, TEE: (IF 1.1-1.3)(AF 1.2)   Protein:  118-148g protein (1.2-1.5g/kg)  Fluid:  UOP+1020mL  EDUCATION NEEDS:   No education  needs identified at this time   [place], RD, LDN Pager [phone]

## 2015-05-05 NOTE — Progress Notes (Signed)
PRE HD   05/05/15 2000  Neurological  Level of Consciousness Alert  Orientation Level Oriented X4  Respiratory  Respiratory Pattern Regular;Unlabored  Chest Assessment Chest expansion symmetrical  Bilateral Breath Sounds Diminished;Clear  Cough Non-productive  Cardiac  Pulse Regular  ECG Monitor Yes  Cardiac Rhythm NSR  Antiarrhythmic device No  Vascular  Edema Generalized  Generalized Edema Other (Comment) (NON PITTING)  Integumentary  Integumentary (WDL) X  Skin Color Appropriate for ethnicity  Skin Condition Dry;Flaky  Skin Integrity MSAD (ON BUTTOCKS)  Musculoskeletal  Musculoskeletal (WDL) WDL  Gastrointestinal  Bowel Sounds Assessment Active  GU Assessment  Genitourinary (WDL) X  Genitourinary Symptoms Other (Comment) (HD)  Psychosocial  Psychosocial (WDL) WDL  Pressure Ulcer 05/04/15 Stage II -  Partial thickness loss of dermis presenting as a shallow open ulcer with a red, pink wound bed without slough. Two separate stage II ulcers next to each other  Date First Assessed/Time First Assessed: 05/04/15 2230   Location: Buttocks  Location Orientation: Left  Staging: Stage II -  Partial thickness loss of dermis presenting as a shallow open ulcer with a red, pink wound bed without slough.  Wound Descrip...  Dressing Type Foam  Dressing Clean;Intact;Dry

## 2015-05-05 NOTE — Care Management (Signed)
Admitted to [facility] with the diagnosis with anemia. Lives with friends. Zoila Shutter [phone]) Discharged from this facility 03/27/15. Goes for dialysis Tuesday-Thursday-Saturday on [employer] since October 2015. Followed by [place] since last admission. Sees Dr. Netty Starring for primary physician care. States he would like to go to [facility] when discharged.  Physical therapy evaluation pending. Will get dialysis today. Hgb 6.8. Blood will be given during dialysis. Shelbie Ammons RN MSN Care Management [phone]

## 2015-05-05 NOTE — Progress Notes (Addendum)
Subjective:   Patient missed his dialysis treatment yesterday due tocontinued weakness.  He had diarrhea as outpatient and reports 3 loose stools today. Work up for c diff is in progress.  Hemoglobin was low. he underwent blood transfusion.  Hemoglobin is only up to 6.8.  Another unit is planned for today  Objective:  Vital signs in last 24 hours:  Temp:  [97.4 F (36.3 C)-98.3 F (36.8 C)] 98.2 F (36.8 C) (08/12 1145) Pulse Rate:  [47-76] 71 (08/12 1145) Resp:  [18-38] 20 (08/12 1145) BP: (100-133)/(58-91) 133/65 mmHg (08/12 1145) SpO2:  [94 %-100 %] 100 % (08/12 1145) Weight:  [99.791 kg (220 lb)] 99.791 kg (220 lb) (08/11 1540)  Weight change:  Filed Weights   05/04/15 1540  Weight: 99.791 kg (220 lb)    Intake/Output:    Intake/Output Summary (Last 24 hours) at 05/05/15 1417 Last data filed at 05/05/15 0330  Gross per 24 hour  Intake    302 ml  Output      0 ml  Net    302 ml     Physical Exam: General: Chronically ill appearing, no acute distress  HEENT Anicteric, moist mucous membranes  Neck Supple, no masses  Pulm/lungs Clear, no crackles  CVS/Heart Irregular, no rub or gallop  Abdomen:  Distended, soft, nontender  Extremities: Some dependent peripheral edema  Neurologic: Alert, speech normal  Skin: No acute rashes  Access: AVF       Basic Metabolic Panel:   Recent Labs Lab 05/04/15 1558 05/05/15 0518  NA 141 142  K 3.1* 3.1*  CL 98* 101  CO2 31 31  GLUCOSE 137* 124*  BUN 37* 44*  CREATININE 6.81* 6.99*  CALCIUM 7.8* 7.6*     CBC:  Recent Labs Lab 05/04/15 1558 05/05/15 0518  WBC 6.9 6.9  NEUTROABS 4.6  --   HGB 6.4* 6.8*  HCT 19.1* 20.6*  MCV 89.6 89.2  PLT 311 295      Microbiology:  Recent Results (from the past 720 hour(s))  Microscopic Examination     Status: Abnormal   Collection Time: 04/10/15 12:06 PM  Result Value Ref Range Status   WBC, UA >30 (A) 0 -  5 /hpf Final   RBC, UA 3-10 (A) 0 -  2 /hpf Final    Epithelial Cells (non renal) 0-10 0 - 10 /hpf Final   Bacteria, UA None seen None seen/Few Final    Coagulation Studies: No results for input(s): LABPROT, INR in the last 72 hours.  Urinalysis: No results for input(s): COLORURINE, LABSPEC, PHURINE, GLUCOSEU, HGBUR, BILIRUBINUR, KETONESUR, PROTEINUR, UROBILINOGEN, NITRITE, LEUKOCYTESUR in the last 72 hours.  Invalid input(s): APPERANCEUR    Imaging: No results found.   Medications:     . sodium chloride   Intravenous Once  . amiodarone  200 mg Oral Daily  . amLODipine  2.5 mg Oral Daily  . atorvastatin  40 mg Oral Daily  . budesonide-formoterol  2 puff Inhalation BID  . feeding supplement (NEPRO CARB STEADY)  237 mL Oral BID WC  . ferrous sulfate  325 mg Oral Q breakfast  . magnesium oxide  400 mg Oral QPM  . pantoprazole  40 mg Oral Daily  . potassium chloride  20 mEq Oral Once  . sevelamer carbonate  800 mg Oral TID AC  . tiotropium  18 mcg Inhalation Daily   acetaminophen **OR** acetaminophen, albuterol, nicotine  Assessment/ Plan:  74 y.o. 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  End-stage renal disease. [facility] dialysis [address]  Tuesday Thursday Saturday. - patient missed his dialysis yesterday.  We wn today. Routine treatment tomorrow 2.  Decreased hemoglobin/anemia of chronic kidney disease - no Procrit because of prostate cancer - Transfuse as necessary - blood to be transfused during dialysis later today 3. Secondary hyperparathyroidism - Monitor phosphorus during hospital stay 4. Diarrhea - Being checked for C. Difficile 5. Hypokalemia - likely due to loose stools - Will dialyze him on 4 K bath. - monitor  closely    LOS: 1 Ashtian Villacis 8/12/20162:17 PM

## 2015-05-05 NOTE — Progress Notes (Signed)
[facility] [place] NAME: [name]    MR#:  [id number]  DATE OF BIRTH:  20-Sep-1941  SUBJECTIVE:  CHIEF COMPLAINT:   Chief Complaint  Patient presents with  . Abnormal Lab   patient is 74 year old African-American male with past medical History of end-stage renal disease, anemia requiring transfusions in the past, history of atrial fibrillation who is not on aspirin or any anticoagulations, who presents to the hospital because of severe weakness, shortness of breath, just not feeling well, laying in bed for the past 2 weeks. He was noted to have low hemoglobin level of 6.4 and was admitted to the hospital for further evaluation and blood transfusion. He received 1 unit of packed red blood cells yesterday, on the day of admission. He still feels weak. Admits of significant diarrhea earlier today. Denies any blood in his stool or urine. Over the past few weeks. However, admits of having some very dark black looking stool few months ago. Guaiac of stool was checked. In emergency room, as it was negative  Review of Systems  Constitutional: Negative for fever, chills and weight loss.  HENT: Negative for congestion.   Eyes: Negative for blurred vision and double vision.  Respiratory: Negative for cough, sputum production, shortness of breath and wheezing.   Cardiovascular: Negative for chest pain, palpitations, orthopnea, leg swelling and PND.  Gastrointestinal: Positive for abdominal pain, diarrhea, constipation and blood in stool. Negative for nausea and vomiting.  Genitourinary: Negative for dysuria, urgency, frequency and hematuria.  Musculoskeletal: Negative for falls.  Neurological: Negative for dizziness, tremors, focal weakness and headaches.  Endo/Heme/Allergies: Does not bruise/bleed easily.  Psychiatric/Behavioral: Negative for depression. The patient does not have insomnia.     VITAL SIGNS: Blood pressure 110/58, pulse 68,  temperature 98 F (36.7 C), temperature source Oral, resp. rate 20, height 6\' 2"  (1.88 m), weight 99.791 kg (220 lb), SpO2 96 %.  PHYSICAL EXAMINATION:   GENERAL:  74 y.o.-year-old patient lying in the bed with no acute distress.  EYES: Pupils equal, round, reactive to light and accommodation. No scleral icterus. Extraocular muscles intact.  HEENT: Head atraumatic, normocephalic. Oropharynx and nasopharynx clear.  NECK:  Supple, no jugular venous distention. No thyroid enlargement, no tenderness.  LUNGS: Mildly diminished breath sounds bilaterally, no wheezing, rales,rhonchi or crepitation. No use of accessory muscles of respiration.  CARDIOVASCULAR: S1, S2 , irregular. No murmurs, rubs, or gallops.  ABDOMEN: Soft, mild discomfort on palpation diffusely but no rebound or guarding were noted and no any discrete areas of tenderness was noted, nondistended. Bowel sounds present. No organomegaly or mass.  EXTREMITIES: No pedal edema, cyanosis, or clubbing.  NEUROLOGIC: Cranial nerves II through XII are intact. Muscle strength 5/5 in all extremities. Sensation intact. Gait not checked.  PSYCHIATRIC: The patient is alert and oriented x 3.  SKIN: No obvious rash, lesion, or ulcer.   ORDERS/RESULTS REVIEWED:   CBC  Recent Labs Lab 05/04/15 1558 05/05/15 0518  WBC 6.9 6.9  HGB 6.4* 6.8*  HCT 19.1* 20.6*  PLT 311 295  MCV 89.6 89.2  MCH 29.8 29.5  MCHC 33.3 33.1  RDW 16.4* 15.8*  LYMPHSABS 1.5  --   MONOABS 0.6  --   EOSABS 0.2  --   BASOSABS 0.0  --    ------------------------------------------------------------------------------------------------------------------  Chemistries   Recent Labs Lab 05/04/15 1558 05/05/15 0518  NA 141 142  K 3.1* 3.1*  CL 98* 101  CO2 31 31  GLUCOSE  137* 124*  BUN 37* 44*  CREATININE 6.81* 6.99*  CALCIUM 7.8* 7.6*   ------------------------------------------------------------------------------------------------------------------ estimated  creatinine clearance is 11.9 mL/min (by C-G formula based on Cr of 6.99). ------------------------------------------------------------------------------------------------------------------ No results for input(s): TSH, T4TOTAL, T3FREE, THYROIDAB in the last 72 hours.  Invalid input(s): FREET3  Cardiac Enzymes No results for input(s): CKMB, TROPONINI, MYOGLOBIN in the last 168 hours.  Invalid input(s): CK ------------------------------------------------------------------------------------------------------------------ Invalid input(s): POCBNP ---------------------------------------------------------------------------------------------------------------  RADIOLOGY: No results found.  EKG:  Orders placed or performed during the hospital encounter of 05/04/15  . EKG 12-Lead  . EKG 12-Lead    ASSESSMENT AND PLAN:  Active Problems:   Symptomatic anemia   Pressure ulcer 1. Symptomatic anemia of unclear etiology at this time, possibly related to end-stage renal disease versus GI bleed in the past, as patient reports black stool few months ago, continue patient on Protonix. Get gastroenterologist involved for further recommendations. Patient was transfused 1 unit of packed red blood cells on 05/04/2015, but his hemoglobin did not improve significantly, just from 6.4-6.8. We are going to give him 1 more unit of packed red blood cells 2. Generalized weakness, possibly related to anemia. Get physical therapist involved 3. End-stage renal disease. Patient is to continue hemodialysis. Nephrology consultation will be obtained 4. COPD symptoms be stable, patient's oxygenation is satisfactory 5. Hypokalemia, supplement orally 6. Tobacco abuse. Initiate patient on nicotine inhaler   Management plans discussed with the patient, family and they are in agreement.   DRUG ALLERGIES:  Allergies  Allergen Reactions  . Penicillins Hives, Itching and Swelling    CODE STATUS:     Code Status  Orders        Start     Ordered   05/04/15 1859  Full code   Continuous     05/04/15 1859    Advance Directive Documentation        Most Recent Value   Type of Advance Directive  Healthcare Power of Attorney   Pre-existing out of facility DNR order (yellow form or pink MOST form)     "MOST" Form in Place?        TOTAL TIME TAKING CARE OF THIS PATIENT: 40 minutes.    Theodoro Grist M.D on 05/05/2015 at 10:18 AM  Between 7am to 6pm - Pager - [phone]  After 6pm go to [email or web address] - password [email or web address] [facility]  Office  [phone]  CC: Primary care physician; Dion Body, MD

## 2015-05-05 NOTE — Progress Notes (Signed)
PT Cancellation Note  Patient Details Name: SECUNDINO ELLITHORPE MRN: [id number] DOB: [birth year]   Cancelled Treatment:    Reason Eval/Treat Not Completed: Patient not medically ready. Chart reviewed, RN consulted. Holding pt treatment at this time as pt continues to present c Hb<7.0, has missed last two HD sessions, and is scheduled to get another unit of blood this afternoon. Will attempt at later date/time.     Vadie Principato C 05/05/2015, 2:41 PM  2:42 PM  Etta Grandchild, PT, DPT [place] License # [id number]

## 2015-05-06 LAB — CBC
HCT: 22.5 % — ABNORMAL LOW (ref 40.0–52.0)
HEMOGLOBIN: 7.4 g/dL — AB (ref 13.0–18.0)
MCH: 28.9 pg (ref 26.0–34.0)
MCHC: 33 g/dL (ref 32.0–36.0)
MCV: 87.8 fL (ref 80.0–100.0)
PLATELETS: 258 10*3/uL (ref 150–440)
RBC: 2.57 MIL/uL — ABNORMAL LOW (ref 4.40–5.90)
RDW: 15.7 % — ABNORMAL HIGH (ref 11.5–14.5)
WBC: 7.3 10*3/uL (ref 3.8–10.6)

## 2015-05-06 LAB — POTASSIUM: Potassium: 3.6 mmol/L (ref 3.5–5.1)

## 2015-05-06 MED ORDER — HYDROCOD POLST-CPM POLST ER 10-8 MG/5ML PO SUER
5.0000 mL | Freq: Two times a day (BID) | ORAL | Status: DC
  Administered 2015-05-06 – 2015-05-08 (×4): 5 mL via ORAL
  Filled 2015-05-06 (×4): qty 5

## 2015-05-06 MED ORDER — GUAIFENESIN ER 600 MG PO TB12
600.0000 mg | ORAL_TABLET | Freq: Two times a day (BID) | ORAL | Status: DC
  Administered 2015-05-06 – 2015-05-08 (×4): 600 mg via ORAL
  Filled 2015-05-06 (×4): qty 1

## 2015-05-06 MED ORDER — NEPRO/CARBSTEADY PO LIQD
237.0000 mL | Freq: Three times a day (TID) | ORAL | Status: DC
  Administered 2015-05-06 – 2015-05-08 (×6): 237 mL via ORAL

## 2015-05-06 NOTE — Plan of Care (Signed)
Problem: Discharge Progression Outcomes Goal: Other Discharge Outcomes/Goals Outcome: Progressing Plan of Care Progress to Goal:   Pt received dialysis during the beginning of shift. Pt denies pain. Pt has been sleeping most of shift. No other signs of distress noted.

## 2015-05-06 NOTE — Progress Notes (Signed)
[facility] [place] NAME: [name]    MR#:  [id number]  DATE OF BIRTH:  [birth year]  SUBJECTIVE:  CHIEF COMPLAINT:   Chief Complaint  Patient presents with  . Abnormal Lab   patient is 74 year old African-American male with past medical History of end-stage renal disease, anemia requiring transfusions in the past, history of atrial fibrillation who is not on aspirin or any anticoagulations, who presents to the hospital because of severe weakness, shortness of breath, just not feeling well, laying in bed for the past 2 weeks. He was noted to have low hemoglobin level of 6.4 and was admitted to the hospital for further evaluation and blood transfusion. He received 1 unit of packed red blood cells yesterday, on the day of admission. He still feels weak. Admits of significant diarrhea earlier today. Denies any blood in his stool or urine. Over the past few weeks. However, admits of having some very dark black looking stool few months ago. Guaiac of stool was checked. In emergency room, as it was negative. Patient was transfused with packed red blood cells. Hemoglobin level is 7.4 today. Feels satisfactory, however, still weak, was evaluated by physical therapist and recommended rehabilitation placement.   Review of Systems  Constitutional: Negative for fever, chills and weight loss.  HENT: Negative for congestion.   Eyes: Negative for blurred vision and double vision.  Respiratory: Negative for cough, sputum production, shortness of breath and wheezing.   Cardiovascular: Negative for chest pain, palpitations, orthopnea, leg swelling and PND.  Gastrointestinal: Positive for abdominal pain, diarrhea, constipation and blood in stool. Negative for nausea and vomiting.  Genitourinary: Negative for dysuria, urgency, frequency and hematuria.  Musculoskeletal: Negative for falls.  Neurological: Negative for dizziness, tremors, focal weakness and  headaches.  Endo/Heme/Allergies: Does not bruise/bleed easily.  Psychiatric/Behavioral: Negative for depression. The patient does not have insomnia.     VITAL SIGNS: Blood pressure 131/79, pulse 67, temperature 98.4 F (36.9 C), temperature source Oral, resp. rate 18, height 6\' 2"  (1.88 m), weight 95.2 kg (209 lb 14.1 oz), SpO2 100 %.  PHYSICAL EXAMINATION:   GENERAL:  74 y.o.-year-old patient lying in the bed with no acute distress.  EYES: Pupils equal, round, reactive to light and accommodation. No scleral icterus. Extraocular muscles intact.  HEENT: Head atraumatic, normocephalic. Oropharynx and nasopharynx clear.  NECK:  Supple, no jugular venous distention. No thyroid enlargement, no tenderness.  LUNGS: Mildly diminished breath sounds bilaterally, no wheezing, rales,rhonchi or crepitation. No use of accessory muscles of respiration.  CARDIOVASCULAR: S1, S2 , irregular. No murmurs, rubs, or gallops.  ABDOMEN: Soft, mild discomfort on palpation diffusely but no rebound or guarding were noted and no any discrete areas of tenderness was noted, nondistended. Bowel sounds present. No organomegaly or mass.  EXTREMITIES: No pedal edema, cyanosis, or clubbing.  NEUROLOGIC: Cranial nerves II through XII are intact. Muscle strength 5/5 in all extremities. Sensation intact. Gait not checked.  PSYCHIATRIC: The patient is alert and oriented x 3.  SKIN: No obvious rash, lesion, or ulcer.   ORDERS/RESULTS REVIEWED:   CBC  Recent Labs Lab 05/04/15 1558 05/05/15 0518 05/06/15 1225  WBC 6.9 6.9 7.3  HGB 6.4* 6.8* 7.4*  HCT 19.1* 20.6* 22.5*  PLT 311 295 258  MCV 89.6 89.2 87.8  MCH 29.8 29.5 28.9  MCHC 33.3 33.1 33.0  RDW 16.4* 15.8* 15.7*  LYMPHSABS 1.5  --   --   MONOABS 0.6  --   --  EOSABS 0.2  --   --   BASOSABS 0.0  --   --    ------------------------------------------------------------------------------------------------------------------  Chemistries   Recent Labs Lab  05/04/15 1558 05/05/15 0518 05/06/15 1225  NA 141 142  --   K 3.1* 3.1* 3.6  CL 98* 101  --   CO2 31 31  --   GLUCOSE 137* 124*  --   BUN 37* 44*  --   CREATININE 6.81* 6.99*  --   CALCIUM 7.8* 7.6*  --    ------------------------------------------------------------------------------------------------------------------ estimated creatinine clearance is 10.9 mL/min (by C-G formula based on Cr of 6.99). ------------------------------------------------------------------------------------------------------------------ No results for input(s): TSH, T4TOTAL, T3FREE, THYROIDAB in the last 72 hours.  Invalid input(s): FREET3  Cardiac Enzymes No results for input(s): CKMB, TROPONINI, MYOGLOBIN in the last 168 hours.  Invalid input(s): CK ------------------------------------------------------------------------------------------------------------------ Invalid input(s): POCBNP ---------------------------------------------------------------------------------------------------------------  RADIOLOGY: No results found.  EKG:  Orders placed or performed during the hospital encounter of 05/04/15  . EKG 12-Lead  . EKG 12-Lead    ASSESSMENT AND PLAN:  Active Problems:   Symptomatic anemia   Pressure ulcer 1. Symptomatic anemia of unclear etiology at this time, possibly related to end-stage renal disease versus remote GI bleed , as patient reports black stool few months ago, continue patient on Protonix. Get gastroenterologist involved for further recommendations. Patient was transfused total of 2 unit of packed red blood cells with mild hemoglobin improvement. Following tomorrow morning and transfuse as needed packed red blood cells 2. Generalized weakness, thought to be due to anemia, deconditioning. Appreciate physical therapist input. Get social work was involved for rehabilitation placement 3. End-stage renal disease. Patient is to continue hemodialysis, per nephrology recommendations,  continued on Tuesday, Thursday, Saturday schedule. Nephrology input is appreciated 4. COPD, no symptoms , patient's oxygenation is satisfactory 5. Hypokalemia, supplemented orally, resolved 6. Tobacco abuse. Continue nicotine inhaler   Management plans discussed with the patient, family and they are in agreement.   DRUG ALLERGIES:  Allergies  Allergen Reactions  . Penicillins Hives, Itching and Swelling    CODE STATUS:     Code Status Orders        Start     Ordered   05/04/15 1859  Full code   Continuous     05/04/15 1859    Advance Directive Documentation        Most Recent Value   Type of Advance Directive  Healthcare Power of Attorney   Pre-existing out of facility DNR order (yellow form or pink MOST form)     "MOST" Form in Place?        TOTAL TIME TAKING CARE OF THIS PATIENT: 30minutes.    Theodoro Grist M.D on 05/06/2015 at 1:29 PM  Between 7am to 6pm - Pager - [phone]  After 6pm go to [email or web address] - password [email or web address] [facility]  Office  [phone]  CC: Primary care physician; Dion Body, MD

## 2015-05-06 NOTE — Progress Notes (Signed)
Subjective:   Patient laying in bed. States his diarrhea has improved.  Hemodialysis for later today.  Hemodialysis yesterday due to missed treatment on Thursday.  Status post PRBC blood transfusion 1 unit yesterday. No follow up hemoglobin   Objective:  Vital signs in last 24 hours:  Temp:  [97.9 F (36.6 C)-98.7 F (37.1 C)] 98.4 F (36.9 C) (08/13 0917) Pulse Rate:  [68-80] 72 (08/13 0917) Resp:  [16-22] 20 (08/13 0917) BP: (118-142)/(57-83) 138/83 mmHg (08/13 0917) SpO2:  [95 %-100 %] 100 % (08/13 0917) Weight:  [95.2 kg (209 lb 14.1 oz)] 95.2 kg (209 lb 14.1 oz) (08/12 2000)  Weight change: -4.591 kg (-10 lb 2 oz) Filed Weights   05/04/15 1540 05/05/15 2000  Weight: 99.791 kg (220 lb) 95.2 kg (209 lb 14.1 oz)    Intake/Output:    Intake/Output Summary (Last 24 hours) at 05/06/15 1018 Last data filed at 05/05/15 2330  Gross per 24 hour  Intake    360 ml  Output    500 ml  Net   -140 ml     Physical Exam: General: Chronically ill appearing, no acute distress  HEENT Anicteric, moist mucous membranes  Neck Supple, no masses  Pulm/lungs Clear, no crackles  CVS/Heart Irregular, no rub or gallop  Abdomen:  Distended, soft, nontender  Extremities: Some dependent peripheral edema  Neurologic: Alert, speech normal  Skin: No acute rashes  Access: Left arm AVF       Basic Metabolic Panel:   Recent Labs Lab 05/04/15 1558 05/05/15 0518  NA 141 142  K 3.1* 3.1*  CL 98* 101  CO2 31 31  GLUCOSE 137* 124*  BUN 37* 44*  CREATININE 6.81* 6.99*  CALCIUM 7.8* 7.6*     CBC:  Recent Labs Lab 05/04/15 1558 05/05/15 0518  WBC 6.9 6.9  NEUTROABS 4.6  --   HGB 6.4* 6.8*  HCT 19.1* 20.6*  MCV 89.6 89.2  PLT 311 295      Microbiology:  Recent Results (from the past 720 hour(s))  Microscopic Examination     Status: Abnormal   Collection Time: 04/10/15 12:06 PM  Result Value Ref Range Status   WBC, UA >30 (A) 0 -  5 /hpf Final   RBC, UA 3-10 (A) 0 -   2 /hpf Final   Epithelial Cells (non renal) 0-10 0 - 10 /hpf Final   Bacteria, UA None seen None seen/Few Final  MRSA PCR Screening     Status: None   Collection Time: 05/05/15  7:28 PM  Result Value Ref Range Status   MRSA by PCR NEGATIVE NEGATIVE Final    Comment:        The GeneXpert MRSA Assay (FDA approved for NASAL specimens only), is one component of a comprehensive MRSA colonization surveillance program. It is not intended to diagnose MRSA infection nor to guide or monitor treatment for MRSA infections.     Coagulation Studies: No results for input(s): LABPROT, INR in the last 72 hours.  Urinalysis: No results for input(s): COLORURINE, LABSPEC, PHURINE, GLUCOSEU, HGBUR, BILIRUBINUR, KETONESUR, PROTEINUR, UROBILINOGEN, NITRITE, LEUKOCYTESUR in the last 72 hours.  Invalid input(s): APPERANCEUR    Imaging: No results found.   Medications:     . sodium chloride   Intravenous Once  . amiodarone  200 mg Oral Daily  . amLODipine  2.5 mg Oral Daily  . atorvastatin  40 mg Oral Daily  . budesonide-formoterol  2 puff Inhalation BID  . chlorpheniramine-HYDROcodone  5 mL  Oral Q12H  . feeding supplement (NEPRO CARB STEADY)  237 mL Oral BID WC  . ferrous sulfate  325 mg Oral Q breakfast  . guaiFENesin  600 mg Oral BID  . magnesium oxide  400 mg Oral QPM  . pantoprazole  40 mg Oral Daily  . potassium chloride  20 mEq Oral Once  . sevelamer carbonate  800 mg Oral TID AC  . tiotropium  18 mcg Inhalation Daily   acetaminophen **OR** acetaminophen, albuterol, nicotine  Assessment/ Plan:  74 y.o. 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   1.  End-stage renal  disease: hemodialysis scheduled for later today. He had hemodialysis treatment yesterday due to missed Thursday treatment. Tolerated treatment well.  - Continue TTS schedule.  - Hypokalemia on last treatment. 3 K bath ordered. Continue oral potassium supplements. Secondary to GI losses.  - Minimal UF due to GI losses.   2.  Anemia of chronic kidney disease: status post 1 unit PRBC transfusion.  - no ESA due to prostate cancer.  - iron supplements - CBC for later today.   3. Secondary hyperparathyroidism: outpatient PTH 230 and phos 4.1- at goal. Corrected calcium at goal.  - Continue sevelamer  4. Hypertension:  - Current regimen of amlodipine  5. Malnutrition: albumin 2.5 as outpatient. With GI losses and prostate cancer. Poor physiologic reserve.  - may need more nutritional support.    LOS: 2 Zamirah Denny 8/13/201610:18 AM

## 2015-05-06 NOTE — Progress Notes (Signed)
PT Cancellation Note  Patient Details Name: DELORES THELEN MRN: [id number] DOB: Feb 24, 1941   Cancelled Treatment:    Reason Eval/Treat Not Completed: Patient at procedure or test/unavailable. Pt receiving HD in room. Will attempt evaluation at later date/time.     Buccola,Allan C 05/06/2015, 1:38 PM 1:39 PM  Etta Grandchild, PT, DPT [place] License # [id number]

## 2015-05-06 NOTE — Plan of Care (Signed)
Problem: Discharge Progression Outcomes Goal: Other Discharge Outcomes/Goals Outcome: Progressing Plan of care progress to goal for:  1. Discharge Plan:         Patient from Home.           Patient and family Concerns for possible need to be discharged to a           nursing care facility due to inability to perform ADLs.      2. Pain:         Denies Pain. 3. Hemodynamically Stable:         VSS. 4. Complications:         Dialysis Schedule Tuesdays, Thursdays and Saturdays.            Dialysis received Friday. Removed 0.82ml. Decreased Hgb.         1 Unit PRBC's given during Diaysis.         Dialysis received Saturday. 5. Diet:         Renal Diet. Decreased Appetite. Tolerating Well.         Fluid Restriction of 1228mL.    6. Activity:        Generalized Weakness.           Unable to participate in PT.

## 2015-05-07 LAB — TYPE AND SCREEN
ABO/RH(D): A NEG
Antibody Screen: NEGATIVE
UNIT DIVISION: 0
UNIT DIVISION: 0
Unit division: 0

## 2015-05-07 LAB — HEPATITIS B SURFACE ANTIGEN: Hepatitis B Surface Ag: NEGATIVE

## 2015-05-07 LAB — HEPATITIS B SURFACE ANTIBODY, QUANTITATIVE

## 2015-05-07 LAB — HEMOGLOBIN: Hemoglobin: 7.6 g/dL — ABNORMAL LOW (ref 13.0–18.0)

## 2015-05-07 NOTE — Plan of Care (Signed)
Problem: Discharge Progression Outcomes Goal: Other Discharge Outcomes/Goals Outcome: Progressing Plan of Care Progress to Goal:   Pt has been sleeping most of shift. Pt denies pain. Pt attempted to walk during shift but pt was not able to walk very far before he was placed back in bed. VSS. No other signs of distress noted. Will continue to monitor.

## 2015-05-07 NOTE — Consult Note (Signed)
Pt seen by me and PA Claudie Leach.  Pt with end stage renal disease and anemia,  Multiple transfusions in the past.  He had a colonoscopy in the past with Dr. Gustavo Lah I think.  The patient has been transfused and is feeling better.  He has significant other medical problems including recent septic shock, COPD on oxygen, dementia, mini stroke, bladder and prostate cancer.  Will follow for now, he has a hx of diarrhea.  Abd non tender at this time.

## 2015-05-07 NOTE — Progress Notes (Signed)
[facility] [place] NAME: [name]    MR#:  [id number]  DATE OF BIRTH:  03-04-1941  SUBJECTIVE:  CHIEF COMPLAINT:   Chief Complaint  Patient presents with  . Abnormal Lab   patient is 74 year old African-American male with past medical History of end-stage renal disease, anemia requiring transfusions in the past, history of atrial fibrillation who is not on aspirin or any anticoagulations, who presents to the hospital because of severe weakness, shortness of breath, just not feeling well, laying in bed for the past 2 weeks. He was noted to have low hemoglobin level of 6.4 and was admitted to the hospital for further evaluation and blood transfusion. He received 1 unit of packed red blood cells yesterday, on the day of admission. He still feels weak. Admits of significant diarrhea earlier today. Denies any blood in his stool or urine. Over the past few weeks. However, admits of having some very dark black looking stool few months ago. Guaiac of stool was checked. In emergency room, as it was negative. Patient was transfused with packed red blood cells. Hemoglobin level is 7.4 today. Feels satisfactory, however, still weak, was evaluated by physical therapist and recommended rehabilitation placement. Feels good today. He denies any significant discomfort, last bowel movement on 05/05/2015 no more diarrhea  Review of Systems  Constitutional: Negative for fever, chills and weight loss.  HENT: Negative for congestion.   Eyes: Negative for blurred vision and double vision.  Respiratory: Negative for cough, sputum production, shortness of breath and wheezing.   Cardiovascular: Negative for chest pain, palpitations, orthopnea, leg swelling and PND.  Gastrointestinal: Positive for abdominal pain, diarrhea, constipation and blood in stool. Negative for nausea and vomiting.  Genitourinary: Negative for dysuria, urgency, frequency and hematuria.   Musculoskeletal: Negative for falls.  Neurological: Negative for dizziness, tremors, focal weakness and headaches.  Endo/Heme/Allergies: Does not bruise/bleed easily.  Psychiatric/Behavioral: Negative for depression. The patient does not have insomnia.     VITAL SIGNS: Blood pressure 127/68, pulse 66, temperature 98 F (36.7 C), temperature source Oral, resp. rate 20, height 6\' 2"  (1.88 m), weight 95.2 kg (209 lb 14.1 oz), SpO2 100 %.  PHYSICAL EXAMINATION:   GENERAL:  74 y.o.-year-old patient lying in the bed with no acute distress.  EYES: Pupils equal, round, reactive to light and accommodation. No scleral icterus. Extraocular muscles intact.  HEENT: Head atraumatic, normocephalic. Oropharynx and nasopharynx clear.  NECK:  Supple, no jugular venous distention. No thyroid enlargement, no tenderness.  LUNGS: Mildly diminished breath sounds bilaterally, no wheezing, rales,rhonchi or crepitation. No use of accessory muscles of respiration.  CARDIOVASCULAR: S1, S2 , irregular. No murmurs, rubs, or gallops.  ABDOMEN: Soft, mild discomfort on palpation diffusely but no rebound or guarding were noted and no any discrete areas of tenderness was noted, nondistended. Bowel sounds present. No organomegaly or mass.  EXTREMITIES: No pedal edema, cyanosis, or clubbing.  NEUROLOGIC: Cranial nerves II through XII are intact. Muscle strength 5/5 in all extremities. Sensation intact. Gait not checked.  PSYCHIATRIC: The patient is alert and oriented x 3.  SKIN: No obvious rash, lesion, or ulcer.   ORDERS/RESULTS REVIEWED:   CBC  Recent Labs Lab 05/04/15 1558 05/05/15 0518 05/06/15 1225 05/07/15 0503  WBC 6.9 6.9 7.3  --   HGB 6.4* 6.8* 7.4* 7.6*  HCT 19.1* 20.6* 22.5*  --   PLT 311 295 258  --   MCV 89.6 89.2 87.8  --  MCH 29.8 29.5 28.9  --   MCHC 33.3 33.1 33.0  --   RDW 16.4* 15.8* 15.7*  --   LYMPHSABS 1.5  --   --   --   MONOABS 0.6  --   --   --   EOSABS 0.2  --   --   --    BASOSABS 0.0  --   --   --    ------------------------------------------------------------------------------------------------------------------  Chemistries   Recent Labs Lab 05/04/15 1558 05/05/15 0518 05/06/15 1225  NA 141 142  --   K 3.1* 3.1* 3.6  CL 98* 101  --   CO2 31 31  --   GLUCOSE 137* 124*  --   BUN 37* 44*  --   CREATININE 6.81* 6.99*  --   CALCIUM 7.8* 7.6*  --    ------------------------------------------------------------------------------------------------------------------ estimated creatinine clearance is 10.9 mL/min (by C-G formula based on Cr of 6.99). ------------------------------------------------------------------------------------------------------------------ No results for input(s): TSH, T4TOTAL, T3FREE, THYROIDAB in the last 72 hours.  Invalid input(s): FREET3  Cardiac Enzymes No results for input(s): CKMB, TROPONINI, MYOGLOBIN in the last 168 hours.  Invalid input(s): CK ------------------------------------------------------------------------------------------------------------------ Invalid input(s): POCBNP ---------------------------------------------------------------------------------------------------------------  RADIOLOGY: No results found.  EKG:  Orders placed or performed during the hospital encounter of 05/04/15  . EKG 12-Lead  . EKG 12-Lead    ASSESSMENT AND PLAN:  Active Problems:   Symptomatic anemia   Pressure ulcer 1. Symptomatic anemia of unclear etiology at this time, possibly related to end-stage renal disease versus remote GI bleed , as patient reports black stool few months ago, continue patient on Protonix. Awaiting for gastroenterologist input and further recommendations. Patient was transfused total of 2 unit of packed red blood cells with mild hemoglobin improvement. Continue current supplementation and following hemoglobin  2. Generalized weakness, thought to be due to anemia, deconditioning. Appreciate  physical therapist input. Get social work was involved for rehabilitation placement 3. End-stage renal disease. Patient is to continue hemodialysis, per nephrology recommendations, continued on Tuesday, Thursday, Saturday schedule. Nephrology input is appreciated 4. COPD, no symptoms , patient's oxygenation is satisfactory 5. Hypokalemia, supplemented orally, resolved 6. Tobacco abuse. Continue nicotine inhaler, no complains   Management plans discussed with the patient, family and they are in agreement.   DRUG ALLERGIES:  Allergies  Allergen Reactions  . Penicillins Hives, Itching and Swelling    CODE STATUS:     Code Status Orders        Start     Ordered   05/04/15 1859  Full code   Continuous     05/04/15 1859    Advance Directive Documentation        Most Recent Value   Type of Advance Directive  Healthcare Power of Attorney   Pre-existing out of facility DNR order (yellow form or pink MOST form)     "MOST" Form in Place?        TOTAL TIME TAKING CARE OF THIS PATIENT: 47minutes.    Theodoro Grist M.D on 05/07/2015 at 1:17 PM  Between 7am to 6pm - Pager - [phone]  After 6pm go to [email or web address] - password [email or web address] [facility]  Office  [phone]  CC: Primary care physician; Dion Body, MD

## 2015-05-07 NOTE — Plan of Care (Signed)
Problem: Discharge Progression Outcomes Goal: Other Discharge Outcomes/Goals Outcome: Progressing Plan of care progress to goal for:  1. Discharge Plan:         Patient from Home.            Patient and family Concerns for possible need to be discharged to a            nursing care facility due to inability to perform ADLs.       2. Pain:         Denies Pain. 3. Hemodynamically Stable:         VSS. 4. Complications:         Dialysis Schedule Tuesdays, Thursdays and Saturdays.             Dialysis received Friday. Removed 0.60ml. Decreased Hgb.         1 Unit PRBC's given during Diaysis.         Dialysis received Saturday. 5. Diet:         Renal Diet. Decreased Appetite. Tolerating Well.         Fluid Restriction of 1286mL.     6. Activity:        Generalized Weakness.            Unable to participate in PT.

## 2015-05-07 NOTE — Clinical Social Work Note (Signed)
Clinical Social Work Assessment  Patient Details  Name: [name] MRN: [id number] Date of Birth: 1941/02/28  Date of referral:  05/07/15               Reason for consult:  Facility Placement                Permission sought to share information with:  Facility Sport and exercise psychologist, Family Supports Permission granted to share information::  Yes, Verbal Permission Granted  Name::      [name] ([place]) [phone])  Agency::   (SNF)  Relationship::     Contact Information:     Housing/Transportation Living arrangements for the past 2 months:  Single Family Home Source of Information:  Patient Patient Interpreter Needed:  None Criminal Activity/Legal Involvement Pertinent to Current Situation/Hospitalization:  No - Comment as needed Significant Relationships:  Other Family Members Lives with:  Self (Roomate moving out) Do you feel safe going back to the place where you live?  Yes Need for family participation in patient care:  Yes (Comment)  Care giving concerns:  No concerns reported   Facilities manager / plan:  CSW in to assess patient for possible SNF placement.  Patient is pleasant and engaged in conversation with CSW.  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 Patient states a friend of his is currently living with him but he is moving to [place] soon and he will be living alone.  Patient has a limited support system, only son is incarcerated and his cousin [name] is his POA.   Patient has been to rehab this past summer and would like to go back to [facility].  Patient uses [facility] for his transportation needs and appointments to dialysis T, Th and Sat.  Patient is short of breath and stated that CSW could call his cousin [name] for additional information if needed.   PT has recommended SNF for short term rehab and patient is in agreement with plan. SNF will assist patient with progressive strength.    CSW will complete FL2 and fax out for  available beds in anticipation of discharge to SNF.  Employment status:  Retired Forensic scientist:  Medicaid [place], [employer] PT Recommendations:  Zeb / Referral to community resources:  Acute Rehab  Patient/Family's Response to care:  Patient is in agreement with going to SNF and would like to go to [facility].  Patient/Family's Understanding of and Emotional Response to Diagnosis, Current Treatment, and Prognosis:  Patient understands that his will discharge to SNF once he is medically stable to leave the hospital.   Emotional Assessment Appearance:  Appears older than stated age Attitude/Demeanor/Rapport:    Affect (typically observed):  Accepting, Appropriate, Pleasant, Anxious Orientation:  Oriented to Self, Oriented to Place, Oriented to Situation Alcohol / Substance use:    Psych involvement (Current and /or in the community):  No (Comment) (per patient)  Discharge Needs  Concerns to be addressed:  Basic Needs, Discharge Planning Concerns Readmission within the last 30 days:  No Current discharge risk:  Physical Impairment, Chronically ill, Dependent with Mobility, Lives alone Barriers to Discharge:  No Barriers Identified   Ethan Cane, LCSW 05/07/2015, 4:20 PM [name]. [name], MSW Clinical Social Work Department Emergency Room [phone] 4:24 PM

## 2015-05-07 NOTE — Progress Notes (Signed)
Subjective:   Hemodialysis yesterday. Tolerated treatment well. Uf of 0.5 No diarrhea today.   Objective:  Vital signs in last 24 hours:  Temp:  [97.5 F (36.4 C)-98.2 F (36.8 C)] 98.2 F (36.8 C) (08/14 1034) Pulse Rate:  [61-71] 71 (08/14 1034) Resp:  [18-20] 18 (08/14 1034) BP: (116-134)/(60-79) 127/71 mmHg (08/14 1034) SpO2:  [97 %-100 %] 100 % (08/14 1034)  Weight change: 0 kg (0 lb) Filed Weights   05/04/15 1540 05/05/15 2000 05/06/15 1129  Weight: 99.791 kg (220 lb) 95.2 kg (209 lb 14.1 oz) 95.2 kg (209 lb 14.1 oz)    Intake/Output:    Intake/Output Summary (Last 24 hours) at 05/07/15 1153 Last data filed at 05/06/15 1447  Gross per 24 hour  Intake      0 ml  Output    500 ml  Net   -500 ml     Physical Exam: General: Chronically ill appearing, no acute distress  HEENT Anicteric, moist mucous membranes  Neck Supple, no masses  Pulm/lungs Clear, no crackles  CVS/Heart Irregular, no rub or gallop  Abdomen:  Distended, soft, nontender  Extremities: Some dependent peripheral edema  Neurologic: Alert, speech normal  Skin: No acute rashes  Access: Left arm AVF       Basic Metabolic Panel:   Recent Labs Lab 05/04/15 1558 05/05/15 0518 05/06/15 1225  NA 141 142  --   K 3.1* 3.1* 3.6  CL 98* 101  --   CO2 31 31  --   GLUCOSE 137* 124*  --   BUN 37* 44*  --   CREATININE 6.81* 6.99*  --   CALCIUM 7.8* 7.6*  --      CBC:  Recent Labs Lab 05/04/15 1558 05/05/15 0518 05/06/15 1225 05/07/15 0503  WBC 6.9 6.9 7.3  --   NEUTROABS 4.6  --   --   --   HGB 6.4* 6.8* 7.4* 7.6*  HCT 19.1* 20.6* 22.5*  --   MCV 89.6 89.2 87.8  --   PLT 311 295 258  --       Microbiology:  Recent Results (from the past 720 hour(s))  Microscopic Examination     Status: Abnormal   Collection Time: 04/10/15 12:06 PM  Result Value Ref Range Status   WBC, UA >30 (A) 0 -  5 /hpf Final   RBC, UA 3-10 (A) 0 -  2 /hpf Final   Epithelial Cells (non renal) 0-10 0 -  10 /hpf Final   Bacteria, UA None seen None seen/Few Final  MRSA PCR Screening     Status: None   Collection Time: 05/05/15  7:28 PM  Result Value Ref Range Status   MRSA by PCR NEGATIVE NEGATIVE Final    Comment:        The GeneXpert MRSA Assay (FDA approved for NASAL specimens only), is one component of a comprehensive MRSA colonization surveillance program. It is not intended to diagnose MRSA infection nor to guide or monitor treatment for MRSA infections.     Coagulation Studies: No results for input(s): LABPROT, INR in the last 72 hours.  Urinalysis: No results for input(s): COLORURINE, LABSPEC, PHURINE, GLUCOSEU, HGBUR, BILIRUBINUR, KETONESUR, PROTEINUR, UROBILINOGEN, NITRITE, LEUKOCYTESUR in the last 72 hours.  Invalid input(s): APPERANCEUR    Imaging: No results found.   Medications:     . sodium chloride   Intravenous Once  . amiodarone  200 mg Oral Daily  . amLODipine  2.5 mg Oral Daily  . atorvastatin  40 mg Oral Daily  . budesonide-formoterol  2 puff Inhalation BID  . chlorpheniramine-HYDROcodone  5 mL Oral Q12H  . feeding supplement (NEPRO CARB STEADY)  237 mL Oral TID WC  . ferrous sulfate  325 mg Oral Q breakfast  . guaiFENesin  600 mg Oral BID  . magnesium oxide  400 mg Oral QPM  . pantoprazole  40 mg Oral Daily  . potassium chloride  20 mEq Oral Once  . sevelamer carbonate  800 mg Oral TID AC  . tiotropium  18 mcg Inhalation Daily   acetaminophen **OR** acetaminophen, albuterol, nicotine  Assessment/ Plan:  74 y.o. 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   1.  End-stage renal disease: hemodialysis yesterdayTolerated treatment  well.  - Continue TTS schedule. Next treatment for Tuesday.  - Hypokalemia improved to 3.6. 3 K bath ordered. Continue oral potassium supplements. Secondary to GI losses.  - Minimal UF due to GI losses.   2.  Anemia of chronic kidney disease: Hemoglobin 7.6.  status post 1 unit PRBC transfusion 8/11 - no ESA due to prostate cancer.  - iron supplements  3. Secondary hyperparathyroidism: outpatient PTH 230 and phos 4.1- at goal. Corrected calcium at goal.  - Continue sevelamer  4. Hypertension:  - Current regimen of amlodipine  5. Malnutrition: albumin 2.5 as outpatient. With GI losses and prostate cancer. Poor physiologic reserve.  - may need more nutritional support.    LOS: 3 Mabry Tift 8/14/201611:53 AM

## 2015-05-07 NOTE — Evaluation (Signed)
Physical Therapy Evaluation Patient Details Name: [name] MRN: [id number] DOB: 1940/12/21 Today's Date: 05/07/2015   History of Present Illness  74 yo male with onset of septic shock and respriatory distress, hypotension and atelectasis, recent bladder history, PMHx:  a-fib with RVR, prostate CA, ESRD T TH S, neurogenic bladder, AAA, anemia,   Clinical Impression  Pt here with multiple medical issues, had dialysis last 2 days as well as recent transfusions so he is feeling better but is still very weak and limited with PT exam.  Multiple attempts at standing with much assist and elevated bed with no ability to get close to upright posture. Pt has been quite limited functionally for a while now.    Follow Up Recommendations SNF    Equipment Recommendations       Recommendations for Other Services       Precautions / Restrictions Precautions Precautions: Fall Restrictions Weight Bearing Restrictions: No      Mobility  Bed Mobility Overal bed mobility: Needs Assistance Bed Mobility: Supine to Sit;Sit to Supine     Supine to sit: Max assist Sit to supine: Mod assist   General bed mobility comments: Pt struggles to maintain sitting balance initially and needs assist, with some set up he maintains sitting  Transfers Overall transfer level: Needs assistance Equipment used: Rolling walker (2 wheeled) Transfers: Sit to/from Stand Sit to Stand: Total assist         General transfer comment: Pt needs elevated bed and very heavy assist just to get his bottom 1-2 inches off the mat  Ambulation/Gait Ambulation/Gait assistance:  (unable)              Stairs            Wheelchair Mobility    Modified Rankin (Stroke Patients Only)       Balance                                             Pertinent Vitals/Pain Pain Assessment:  (minimal pain, more just profound weakness)    Home Living Family/patient expects to be discharged  to:: Skilled nursing facility                      Prior Function Level of Independence: Needs assistance   Gait / Transfers Assistance Needed: pt reports he has been able to do very little functional activity for a long time           Hand Dominance        Extremity/Trunk Assessment   Upper Extremity Assessment: Generalized weakness           Lower Extremity Assessment: Generalized weakness (R LE weaker than L )         Communication      Cognition Arousal/Alertness: Awake/alert Behavior During Therapy: WFL for tasks assessed/performed Overall Cognitive Status: Within Functional Limits for tasks assessed                      General Comments      Exercises        Assessment/Plan    PT Assessment Patient needs continued PT services  PT Diagnosis Difficulty walking;Generalized weakness   PT Problem List Decreased strength;Decreased activity tolerance;Decreased range of motion;Decreased balance;Decreased mobility;Decreased coordination;Decreased cognition;Decreased safety awareness;Pain  PT Treatment Interventions Gait training;Functional mobility training;Therapeutic exercise;Therapeutic  activities;Balance training;Neuromuscular re-education   PT Goals (Current goals can be found in the Care Plan section) Acute Rehab PT Goals Patient Stated Goal: "I do know I need to get stronger" PT Goal Formulation: With patient Time For Goal Achievement: 05/21/15 Potential to Achieve Goals: Fair    Frequency Min 2X/week   Barriers to discharge   discussed with pt that he may have to plan for higher level of care depending on progress at STR    Co-evaluation               End of Session Equipment Utilized During Treatment: Gait belt Activity Tolerance: Patient limited by fatigue Patient left: with bed alarm set           Time: 3383-2919 PT Time Calculation (min) (ACUTE ONLY): 21 min   Charges:   PT Evaluation $Initial PT  Evaluation Tier I: 1 Procedure     PT G Codes:       Wayne Both, PT, DPT [phone]  Kreg Shropshire 05/07/2015, 3:36 PM

## 2015-05-07 NOTE — Consult Note (Signed)
GI Inpatient Consult Note  Reason for Consult: Symptomatic Anemia   Attending Requesting Consult:  Dr. Ether Griffins  History of Present Illness: [name] is a 74 y.o. male who reports that he came to the hospital on Thursday being  sent by Dr. Holley Raring  after missing his dialysis.  He reports he  Was "just not feeling good."  He reports he started having explosive diarrhea on Friday, with 4 episodes Friday, one episode yesterday, and denies any episodes so far today.  He denies abdominal pain,  cramping, nausea, vomiting.  He was hospitalized for pneumonia with the discharge date of March 27, 2015.  He had been on Levaquin, however failed therapy.  Was treated with Ceftaz and vancomycin and then switched to Levaquin at the time of discharge.  His hemoglobin has a baseline of approximately 8.0.  His hemoglobin on admission was  6.4.  He received 2 units of blood, is currently 7.6.  He has been treated for his anemia with transfusions in the past.  The last GI consult done by [facility] clinic in the GI department was during his hospitalization at the end of May, 2016.   The consult was for anemia with heme-positive stool.  His hemoglobin at that time of admission was 5.9, he received 2 units and his hemoglobin rebounded to 8.4.  The  assessment at that time was that the anemia was likely multi factorial with chronic kidney disease on hemodialysis and some GI blood loss.  He was hemodynamically stable.  The plan was to continue PPI as an outpatient.  He was seen in our office on May 01, 2015 with concerns of urgent diarrhea and fecal incontinence.  At that time he denied any abdominal pain, seeing any blood in his stool, nausea, vomiting.  The plan from that visit was that since he had a fairly recent colonoscopy with negative findings and  that he currently is not a good candidate for sedated procedure due to his complicated co-morbidities we would stop his Imodium for now and will recheck his  stool for C diff, his hospitalization test was negative.  He was encouraged to take Metamucil daily as well as daily yogurt.  There was no collection completed yet for his C diff test.  His past medical history is significant for end-stage renal disease, with dialysis on Tuesdays, Thursdays, Saturdays.  Also significant for AFib, congestive heart failure with EF of 45%.  His last colonoscopy was in 2012 with Dr. Gustavo Lah for indication of change in bowel habits and diarrhea and was notable for poor preparation, diverticulosis and biopsies were negative for microscopic colitis.  Past Medical History:  Past Medical History  Diagnosis Date  . PAF (paroxysmal atrial fibrillation)     a. new onset 12/2014 in the setting of UTI, sepsis, hypotension, and anemia; b. not on long term anticoagulation given anemia; c. family aware of stroke risk, they are ok with this; d. on amiodarone   . Chronic combined systolic and diastolic CHF (congestive heart failure)     a. echo 12/2014: EF 25-30%, anterior wall wall motion abnormalities; b. planned ischemic evaluation once patient is stable medically; c. echo 01/2015: EF 45-50%, no RWMA, mild AT, LA mildly dilated, mod pericardial effusion along LV free wall, no evidence of hemodynamic compromise  . ESRD on hemodialysis     a. Tuesday, Thursday, and Saturdays  . Anemia     a. baseline hgb ~ 8  . History of small bowel obstruction  a. 01/2015  . History of septic shock     a. 01/2015  . GERD (gastroesophageal reflux disease)   . Allergic rhinitis   . COPD (chronic obstructive pulmonary disease)   . Hypercholesteremia   . Cognitive communication deficit   . Magnesium deficiency   . Dementia   . Iliac aneurysm   . Incontinence   . Hydronephrosis   . Overactive bladder   . Detrusor sphincter dyssynergia   . Mini stroke   . Bladder cancer   . Prostate cancer   . Renal insufficiency   . Hypertension     Problem List: Patient Active Problem List    Diagnosis Date Noted  . Pressure ulcer 05/05/2015  . Symptomatic anemia 05/04/2015  . CAFL (chronic airflow limitation) 04/10/2015  . Malnutrition of moderate degree 03/22/2015  . Abdominal pain   . Pneumonia 03/20/2015  . History of sepsis 02/27/2015  . Incontinence 02/27/2015  . Persistent atrial fibrillation 02/27/2015  . Preoperative cardiovascular examination 02/27/2015  . GI bleed 02/13/2015  . Ileus   . Chronic combined systolic and diastolic CHF (congestive heart failure)   . ESRD on hemodialysis   . Anemia   . History of small bowel obstruction   . Atrial fibrillation with RVR 01/21/2015  . Chronic kidney disease (CKD), stage IV (severe) 03/01/2014  . Essential (primary) hypertension 03/01/2014  . BP (high blood pressure) 03/01/2014  . 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 Absence of bladder continence 03/01/2014  . Chronic kidney disease requiring chronic dialysis 03/01/2014  . Bladder compliance low 11/11/2012  . Cystitis 11/11/2012  . Acute cystitis 10/15/2012  . AAA (abdominal aortic aneurysm) 08/27/2012  . Aneurysm artery, iliac 08/27/2012  . Chronic kidney disease (CKD), stage V 08/27/2012  . Hydronephrosis 08/27/2012  . Incomplete bladder emptying 08/27/2012  . Bladder cancer 08/27/2012  . CA of prostate 08/27/2012  . Bone metastases 08/27/2012  . Infection of urinary tract 08/27/2012    Past Surgical History: Past Surgical History  Procedure Laterality Date  . Cystoscopy w/ ureteral stent placement    . Transurethral resection of bladder tumor with gyrus (turbt-gyrus)    . Ureteral stent placement    . Prostate surgery      Removal  . Av fistula placement      left arm    Allergies: Allergies  Allergen Reactions  . Penicillins Hives, Itching and Swelling    Home Medications: Prescriptions prior to admission  Medication Sig Dispense Refill Last Dose  . albuterol (PROVENTIL HFA;VENTOLIN HFA) 108 (90 BASE) MCG/ACT inhaler Inhale 2 puffs into the  lungs every 4 (four) hours as needed for wheezing or shortness of breath.   PRN at PRN  . amiodarone (PACERONE) 200 MG tablet Take 1 tablet (200 mg total) by mouth daily. 30 tablet 5 unknown at unknown  . amLODipine (NORVASC) 2.5 MG tablet Take 2.5 mg by mouth daily.   unknown at unknown  . atorvastatin (LIPITOR) 40 MG tablet Take 1 tablet (40 mg total) by mouth daily. 30 tablet 1 unknown at unknown  . budesonide-formoterol (SYMBICORT) 160-4.5 MCG/ACT inhaler Inhale 2 puffs into the lungs 2 (two) times daily.   unknown at unknown  . ferrous sulfate 325 (65 FE) MG tablet Take 1 tablet (325 mg total) by mouth daily with breakfast. 90 tablet 0 unknown at unknown  . magnesium oxide (MAG-OX) 400 MG tablet Take 400 mg by mouth every evening.    unknown at unknown  . pantoprazole (PROTONIX) 40 MG tablet Take 1  tablet (40 mg total) by mouth daily. 30 tablet 0 unknown at unknown  . sevelamer carbonate (RENVELA) 800 MG tablet Take 800 mg by mouth 3 (three) times daily before meals.    unknown at unknown  . tiotropium (SPIRIVA) 18 MCG inhalation capsule Place 1 capsule (18 mcg total) into inhaler and inhale daily. 30 capsule 12 unknown at unknown  . Nutritional Supplements (FEEDING SUPPLEMENT, NEPRO CARB STEADY,) LIQD Take 237 mLs by mouth 2 (two) times daily between meals. (Patient not taking: Reported on 05/04/2015) 30 Can 0 Taking  . polyethylene glycol (MIRALAX / GLYCOLAX) packet Take 17 g by mouth daily. (Patient not taking: Reported on 04/10/2015) 14 each 0 Not Taking   Home medication reconciliation was completed with the patient.   Scheduled Inpatient Medications:   . sodium chloride   Intravenous Once  . amiodarone  200 mg Oral Daily  . amLODipine  2.5 mg Oral Daily  . atorvastatin  40 mg Oral Daily  . budesonide-formoterol  2 puff Inhalation BID  . chlorpheniramine-HYDROcodone  5 mL Oral Q12H  . feeding supplement (NEPRO CARB STEADY)  237 mL Oral TID WC  . ferrous sulfate  325 mg Oral Q  breakfast  . guaiFENesin  600 mg Oral BID  . magnesium oxide  400 mg Oral QPM  . pantoprazole  40 mg Oral Daily  . potassium chloride  20 mEq Oral Once  . sevelamer carbonate  800 mg Oral TID AC  . tiotropium  18 mcg Inhalation Daily    Continuous Inpatient Infusions:     PRN Inpatient Medications:  acetaminophen **OR** acetaminophen, albuterol, nicotine  Family History: family history includes Stroke in his mother.   Social History:   reports that he has been smoking.  He has never used smokeless tobacco. He reports that he does not drink alcohol or use illicit drugs.    Review of Systems: Constitutional: Weight is stable.  Eyes: No changes in vision. ENT: No oral lesions, sore throat.  GI: see HPI.  Heme/Lymph: No easy bruising.  CV: No chest pain.  GU: No hematuria.  Integumentary: No rashes.  Neuro: No headaches.  Psych: No depression/anxiety.  Endocrine: No heat/cold intolerance.  Allergic/Immunologic: No urticaria.  Musculoskeletal: No joint swelling.    Physical Examination: BP 127/71 mmHg  Pulse 71  Temp(Src) 98.2 F (36.8 C) (Oral)  Resp 18  Ht 6\' 2"  (1.88 m)  Wt 95.2 kg (209 lb 14.1 oz)  BMI 26.94 kg/m2  SpO2 100% Gen: NAD, alert and oriented x 4, good historian HEENT: PEERLA, EOMI, Neck: supple, no JVD or thyromegaly Chest: CTA bilaterally, no wheezes, crackles, or other adventitious sounds CV: RRR, no m/g/c/r Abd: soft, NT, ND, +BS in all four quadrants; no HSM, guarding, ridigity, or rebound tenderness Ext: no edema, well perfused with 2+ pulses, Skin: no rash or lesions noted Lymph: no LAD Rectal exam: negative without mass, lesions or tenderness, stool guaiac negative.  Data: Lab Results  Component Value Date   WBC 7.3 05/06/2015   HGB 7.6* 05/07/2015   HCT 22.5* 05/06/2015   MCV 87.8 05/06/2015   PLT 258 05/06/2015    Recent Labs Lab 05/05/15 0518 05/06/15 1225 05/07/15 0503  HGB 6.8* 7.4* 7.6*   Lab Results  Component Value  Date   NA 142 05/05/2015   K 3.6 05/06/2015   CL 101 05/05/2015   CO2 31 05/05/2015   BUN 44* 05/05/2015   CREATININE 6.99* 05/05/2015   Lab Results  Component Value Date  ALT 6* 03/18/2015   AST 12* 03/18/2015   ALKPHOS 74 03/18/2015   BILITOT 0.8 03/18/2015   No results for input(s): APTT, INR, PTT in the last 168 hours.   Assessment/Plan: Mr. Rondinelli is a 74 y.o. male with symptomatic anemia and diarrhea.  His diarrhea may be resolving.  C-diff studies are still pending collection of stool.  His rectal exam was brown stool, heme negative.  Recommendations:  His anemia may be multifactorial.  We recommend checking his B12, iron binding study, retic count and serial Hgb.    We also recommend a stool Hemoccult study (one was ordered Thursday, but has not been performed). We will further research to determine if he has ever had a capsule study done before.  We will consider doing an upper endoscopy as well a colonoscopy at the beginning of the week. Thank you for the consult. Please call with questions or concerns.  Salvadore Farber, PA-C  I personally performed these services.

## 2015-05-08 DIAGNOSIS — N186 End stage renal disease: Secondary | ICD-10-CM

## 2015-05-08 DIAGNOSIS — Z992 Dependence on renal dialysis: Secondary | ICD-10-CM

## 2015-05-08 DIAGNOSIS — R531 Weakness: Secondary | ICD-10-CM

## 2015-05-08 DIAGNOSIS — J438 Other emphysema: Secondary | ICD-10-CM

## 2015-05-08 DIAGNOSIS — F17200 Nicotine dependence, unspecified, uncomplicated: Secondary | ICD-10-CM

## 2015-05-08 DIAGNOSIS — E876 Hypokalemia: Secondary | ICD-10-CM

## 2015-05-08 LAB — IRON AND TIBC
Iron: 20 ug/dL — ABNORMAL LOW (ref 45–182)
SATURATION RATIOS: 10 % — AB (ref 17.9–39.5)
TIBC: 202 ug/dL — AB (ref 250–450)
UIBC: 182 ug/dL

## 2015-05-08 LAB — CLOSTRIDIUM DIFFICILE BY PCR: Toxigenic C. Difficile by PCR: NEGATIVE

## 2015-05-08 LAB — RETICULOCYTES
RBC.: 2.65 MIL/uL — ABNORMAL LOW (ref 4.40–5.90)
Retic Count, Absolute: 29.2 10*3/uL (ref 19.0–183.0)
Retic Ct Pct: 1.1 % (ref 0.4–3.1)

## 2015-05-08 LAB — C DIFFICILE QUICK SCREEN W PCR REFLEX
C Diff antigen: POSITIVE — AB
C Diff toxin: NEGATIVE

## 2015-05-08 LAB — VITAMIN B12: VITAMIN B 12: 511 pg/mL (ref 180–914)

## 2015-05-08 LAB — HEMOGLOBIN AND HEMATOCRIT, BLOOD
HCT: 23.9 % — ABNORMAL LOW (ref 40.0–52.0)
Hemoglobin: 7.6 g/dL — ABNORMAL LOW (ref 13.0–18.0)

## 2015-05-08 MED ORDER — GUAIFENESIN ER 600 MG PO TB12: 600.0000 mg | ORAL_TABLET | Freq: Two times a day (BID) | ORAL | Status: DC

## 2015-05-08 MED ORDER — NEPRO/CARBSTEADY PO LIQD
237.0000 mL | Freq: Three times a day (TID) | ORAL | Status: DC
Start: 2015-05-08 — End: 2015-05-08
  Administered 2015-05-08: 13:00:00 237 mL via ORAL

## 2015-05-08 MED ORDER — PANTOPRAZOLE SODIUM 40 MG PO TBEC: 40.0000 mg | DELAYED_RELEASE_TABLET | Freq: Two times a day (BID) | ORAL | Status: DC

## 2015-05-08 MED ORDER — HYDROCOD POLST-CPM POLST ER 10-8 MG/5ML PO SUER: 5.0000 mL | Freq: Two times a day (BID) | ORAL | Status: DC

## 2015-05-08 MED ORDER — NICOTINE 10 MG IN INHA: 1.0000 | RESPIRATORY_TRACT | Status: DC | PRN

## 2015-05-08 NOTE — Progress Notes (Signed)
Patient active with SN/PT services with [facility].

## 2015-05-08 NOTE — Progress Notes (Signed)
Subjective:  Pt resting comfortably in bed. No acute complaints. Due for HD again tomorrow.   Objective:  Vital signs in last 24 hours:  Temp:  [97.9 F (36.6 C)-98.4 F (36.9 C)] 97.9 F (36.6 C) (08/15 0544) Pulse Rate:  [66-74] 68 (08/15 0544) Resp:  [18-20] 20 (08/15 0544) BP: (121-127)/(67-72) 122/72 mmHg (08/15 0544) SpO2:  [100 %] 100 % (08/15 0544)  Weight change:  Filed Weights   05/04/15 1540 05/05/15 2000 05/06/15 1129  Weight: 99.791 kg (220 lb) 95.2 kg (209 lb 14.1 oz) 95.2 kg (209 lb 14.1 oz)    Intake/Output:   No intake or output data in the 24 hours ending 05/08/15 1218   Physical Exam: General: Chronically ill appearing  HEENT Anicteric, moist mucous membranes  Neck Supple  Pulm/lungs Clear, normal effort  CVS/Heart Irregular, no rub or gallop  Abdomen:  Distended, soft, nontender  Extremities: 1+ b/l LE edema  Neurologic: Alert, speech normal  Skin: No acute rashes  Access: Left arm AVF       Basic Metabolic Panel:   Recent Labs Lab 05/04/15 1558 05/05/15 0518 05/06/15 1225  NA 141 142  --   K 3.1* 3.1* 3.6  CL 98* 101  --   CO2 31 31  --   GLUCOSE 137* 124*  --   BUN 37* 44*  --   CREATININE 6.81* 6.99*  --   CALCIUM 7.8* 7.6*  --      CBC:  Recent Labs Lab 05/04/15 1558 05/05/15 0518 05/06/15 1225 05/07/15 0503 05/08/15 0511  WBC 6.9 6.9 7.3  --   --   NEUTROABS 4.6  --   --   --   --   HGB 6.4* 6.8* 7.4* 7.6* 7.6*  HCT 19.1* 20.6* 22.5*  --  23.9*  MCV 89.6 89.2 87.8  --   --   PLT 311 295 258  --   --       Microbiology:  Recent Results (from the past 720 hour(s))  Microscopic Examination     Status: Abnormal   Collection Time: 04/10/15 12:06 PM  Result Value Ref Range Status   WBC, UA >30 (A) 0 -  5 /hpf Final   RBC, UA 3-10 (A) 0 -  2 /hpf Final   Epithelial Cells (non renal) 0-10 0 - 10 /hpf Final   Bacteria, UA None seen None seen/Few Final  MRSA PCR Screening     Status: None   Collection Time:  05/05/15  7:28 PM  Result Value Ref Range Status   MRSA by PCR NEGATIVE NEGATIVE Final    Comment:        The GeneXpert MRSA Assay (FDA approved for NASAL specimens only), is one component of a comprehensive MRSA colonization surveillance program. It is not intended to diagnose MRSA infection nor to guide or monitor treatment for MRSA infections.     Coagulation Studies: No results for input(s): LABPROT, INR in the last 72 hours.  Urinalysis: No results for input(s): COLORURINE, LABSPEC, PHURINE, GLUCOSEU, HGBUR, BILIRUBINUR, KETONESUR, PROTEINUR, UROBILINOGEN, NITRITE, LEUKOCYTESUR in the last 72 hours.  Invalid input(s): APPERANCEUR    Imaging: No results found.   Medications:     . sodium chloride   Intravenous Once  . amiodarone  200 mg Oral Daily  . amLODipine  2.5 mg Oral Daily  . atorvastatin  40 mg Oral Daily  . budesonide-formoterol  2 puff Inhalation BID  . chlorpheniramine-HYDROcodone  5 mL Oral Q12H  . feeding  supplement (NEPRO CARB STEADY)  237 mL Oral TID WC  . ferrous sulfate  325 mg Oral Q breakfast  . guaiFENesin  600 mg Oral BID  . magnesium oxide  400 mg Oral QPM  . pantoprazole  40 mg Oral Daily  . potassium chloride  20 mEq Oral Once  . sevelamer carbonate  800 mg Oral TID AC  . tiotropium  18 mcg Inhalation Daily   acetaminophen **OR** acetaminophen, albuterol, nicotine  Assessment/ Plan:  74 y.o. 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   1.  End-stage renal disease: No urgent indication for HD today, will plan for HD again tomorrow.  2.  Anemia of chronic kidney disease: Avoiding epogen given iron supplements, will continue  to monitor hgb, has been transfused this admission, continue ferrous sulfate.   3. Secondary hyperparathyroidism: check phos with next HD, continue renvela.  4. Hypertension:  - well controlled, continue low dose amlodipine.  5. Malnutrition: albumin 2.5 as outpatient. With GI losses and prostate cancer. Poor physiologic reserve.  Continue nepro supplements.   LOS: 4 Marta Bouie 8/15/201612:18 PM

## 2015-05-08 NOTE — Progress Notes (Signed)
Nutrition Follow-up    INTERVENTION:  Nutrition Supplement Therapy: continue Nepro Shake po TID, each supplement provides 425 kcal and 19 grams protein for added nutrition Coordination of care: Will ask nursing to record po intake as pt on isolation    NUTRITION DIAGNOSIS:   Increased nutrient needs related to chronic illness, wound healing as evidenced by estimated needs, being addressed with po diet and supplements    GOAL:   Patient will meet greater than or equal to 90% of their needs    MONITOR:    (Energy Intake, Anthropometrics, UOP, Electrolyte and renal Profile)  REASON FOR ASSESSMENT:    (Renal diet)    ASSESSMENT:      Current Nutrition: Pt reports ate 2 pancakes this am, drank nepro (100%) and 1/2 of home fries.  Reports last night at chicken sandwich and few carrots.     Gastrointestinal Profile:  Last BM: 8/12   Medications: reviewed  Electrolyte/Renal Profile and Glucose Profile:   Recent Labs Lab 05/04/15 1558 05/05/15 0518 05/06/15 1225  NA 141 142  --   K 3.1* 3.1* 3.6  CL 98* 101  --   CO2 31 31  --   BUN 37* 44*  --   CREATININE 6.81* 6.99*  --   CALCIUM 7.8* 7.6*  --   GLUCOSE 137* 124*  --      Nutrition-Focused Physical Exam Findings: Nutrition-Focused physical exam completed. Findings are normal fat depletion, mild/moderate to normal muscle depletion, and none edema.      Weight Trend since Admission: Filed Weights   05/04/15 1540 05/05/15 2000 05/06/15 1129  Weight: 220 lb (99.791 kg) 209 lb 14.1 oz (95.2 kg) 209 lb 14.1 oz (95.2 kg)      Diet Order:  Diet renal with fluid restriction Fluid restriction:: 1200 mL Fluid; Room service appropriate?: Yes; Fluid consistency:: Thin Diet - low sodium heart healthy  Skin:   (Stage II buttock pressure ulcer)   Height:   Ht Readings from Last 1 Encounters:  05/04/15 6\' 2"  (1.88 m)    Weight:   Wt Readings from Last 1 Encounters:  05/06/15 209 lb 14.1 oz (95.2 kg)     Ideal Body Weight:     BMI:  Body mass index is 26.94 kg/(m^2).  Estimated Nutritional Needs:   Kcal:  2375-2807kcals, BEE: 1800kcals, TEE: (IF 1.1-1.3)(AF 1.2)   Protein:  118-148g protein (1.2-1.5g/kg)  Fluid:  UOP+1049mL  EDUCATION NEEDS:   No education needs identified at this time  [place]  Kimiah Hibner B. Zenia Resides, [place], [place] (pager)

## 2015-05-08 NOTE — Progress Notes (Signed)
Patient being discharged to [facility]. Report given to [place] at facility. EMS called for transport.

## 2015-05-08 NOTE — Discharge Summary (Signed)
[facility] [place] NAME: [name]    MR#:  [id number]  DATE OF BIRTH:  [birth year]  DATE OF ADMISSION:  05/04/2015 ADMITTING PHYSICIAN: Loletha Grayer, MD  DATE OF DISCHARGE: No discharge date for patient encounter.  PRIMARY CARE PHYSICIAN: Dion Body, MD     ADMISSION DIAGNOSIS:  Anemia, unspecified anemia type [D64.9]  DISCHARGE DIAGNOSIS:  Principal Problem:   Symptomatic anemia Active Problems:   Generalized weakness   Pressure ulcer   ESRD on dialysis   Other emphysema   Tobacco use disorder   Hypokalemia   SECONDARY DIAGNOSIS:   Past Medical History  Diagnosis Date  . PAF (paroxysmal atrial fibrillation)     a. new onset 12/2014 in the setting of UTI, sepsis, hypotension, and anemia; b. not on long term anticoagulation given anemia; c. family aware of stroke risk, they are ok with this; d. on amiodarone   . Chronic combined systolic and diastolic CHF (congestive heart failure)     a. echo 12/2014: EF 25-30%, anterior wall wall motion abnormalities; b. planned ischemic evaluation once patient is stable medically; c. echo 01/2015: EF 45-50%, no RWMA, mild AT, LA mildly dilated, mod pericardial effusion along LV free wall, no evidence of hemodynamic compromise  . ESRD on hemodialysis     a. Tuesday, Thursday, and Saturdays  . Anemia     a. baseline hgb ~ 8  . History of small bowel obstruction     a. 01/2015  . History of septic shock     a. 01/2015  . GERD (gastroesophageal reflux disease)   . Allergic rhinitis   . COPD (chronic obstructive pulmonary disease)   . Hypercholesteremia   . Cognitive communication deficit   . Magnesium deficiency   . Dementia   . Iliac aneurysm   . Incontinence   . Hydronephrosis   . Overactive bladder   . Detrusor sphincter dyssynergia   . Mini stroke   . Bladder cancer   . Prostate cancer   . Renal insufficiency   . Hypertension     .pro HOSPITAL COURSE:    The patient is 74 year old African-American male with past medical  history is significant for history of end-stage renal disease on hemodialysis Tuesdays, Thursdays, Saturdays who presents to the hospital with complaints of weakness being worn-out. In emergency room, where he was rectally. Negative for blood. However, he was noted to be severely anemic with hemoglobin level VI.4 and was admitted to the hospital for further evaluation and blood transfusions.  He admitted of having some black looking stools few months ago. However, as mentioned above, was guaiac negative in ER. She was transfused packed red blood cells after which she is hemoglobin level improved to 7.6 and remained stable. No bleeding was noted while in the hospital. Patient was consulted by gastroenterologist who felt that patient should follow-up with him for possibly repeating studies. While in the hospital patient was having some problems with diarrhea. However, when attempt was made to collect the stool for cultures,  no specimen was available. Because of the weakness. Patient is to be evaluated by physical therapist and very likely to be referred to skilled nursing facility. Discharged to skilled nursing facility depending on bed availability Discussion by problem 1. Symptomatic anemia of unclear etiology at this time, possibly multifactorial related to end-stage renal disease as well as questionable remote GI bleed , as patient reported black stools few months ago, continue patient on Protonix. Appreciate gastroenterologist  input and recommended further follow-up as outpatient. Patient was transfused packed red blood cells with mild hemoglobin improvement. Continue current supplementation and following hemoglobin, transfuse if declining during hemodialysis sessions.   2. Generalized weakness, thought to be due to anemia, deconditioning. Getting physical therapist evaluation, however, looking in patient's physical abilities, it appears  that he would benefit from rehabilitation placement. Getting social work involved 3. End-stage renal disease. Patient is to continue hemodialysis, per nephrology recommendations, continued on Tuesday, Thursday, Saturday schedule. Nephrology input is appreciated 4. COPD, no symptoms , patient's oxygenation is satisfactory 5. Hypokalemia, supplemented orally, resolved 6. Tobacco abuse. Continue nicotine inhaler, no complains 7. Diarrhea, resolved no stool cultures are available DISCHARGE CONDITIONS:   Stable  CONSULTS OBTAINED:     DRUG ALLERGIES:   Allergies  Allergen Reactions  . Penicillins Hives, Itching and Swelling    DISCHARGE MEDICATIONS:   Current Discharge Medication List    START taking these medications   Details  chlorpheniramine-HYDROcodone (TUSSIONEX) 10-8 MG/5ML SUER Take 5 mLs by mouth every 12 (twelve) hours. Qty: 140 mL, Refills: 0    guaiFENesin (MUCINEX) 600 MG 12 hr tablet Take 1 tablet (600 mg total) by mouth 2 (two) times daily. Qty: 20 tablet, Refills: 2    nicotine (NICOTROL) 10 MG inhaler Inhale 1 cartridge (1 continuous puffing total) into the lungs as needed for smoking cessation. Qty: 42 each, Refills: 0      CONTINUE these medications which have CHANGED   Details  pantoprazole (PROTONIX) 40 MG tablet Take 1 tablet (40 mg total) by mouth 2 (two) times daily. Qty: 30 tablet, Refills: 0      CONTINUE these medications which have NOT CHANGED   Details  albuterol (PROVENTIL HFA;VENTOLIN HFA) 108 (90 BASE) MCG/ACT inhaler Inhale 2 puffs into the lungs every 4 (four) hours as needed for wheezing or shortness of breath.    amiodarone (PACERONE) 200 MG tablet Take 1 tablet (200 mg total) by mouth daily. Qty: 30 tablet, Refills: 5    amLODipine (NORVASC) 2.5 MG tablet Take 2.5 mg by mouth daily.    atorvastatin (LIPITOR) 40 MG tablet Take 1 tablet (40 mg total) by mouth daily. Qty: 30 tablet, Refills: 1    budesonide-formoterol (SYMBICORT)  160-4.5 MCG/ACT inhaler Inhale 2 puffs into the lungs 2 (two) times daily.    ferrous sulfate 325 (65 FE) MG tablet Take 1 tablet (325 mg total) by mouth daily with breakfast. Qty: 90 tablet, Refills: 0    magnesium oxide (MAG-OX) 400 MG tablet Take 400 mg by mouth every evening.     sevelamer carbonate (RENVELA) 800 MG tablet Take 800 mg by mouth 3 (three) times daily before meals.     tiotropium (SPIRIVA) 18 MCG inhalation capsule Place 1 capsule (18 mcg total) into inhaler and inhale daily. Qty: 30 capsule, Refills: 12    Nutritional Supplements (FEEDING SUPPLEMENT, NEPRO CARB STEADY,) LIQD Take 237 mLs by mouth 2 (two) times daily between meals. Qty: 30 Can, Refills: 0    polyethylene glycol (MIRALAX / GLYCOLAX) packet Take 17 g by mouth daily. Qty: 14 each, Refills: 0         DISCHARGE INSTRUCTIONS:    Patient is to follow-up with his primary care physician, Dr. Netty Starring and Dr. Vira Agar,  gastroenterologist   If you experience worsening of your admission symptoms, develop shortness of breath, life threatening emergency, suicidal or homicidal thoughts you must seek medical attention immediately by calling 911 or calling your MD immediately  if  symptoms less severe.  You Must read complete instructions/literature along with all the possible adverse reactions/side effects for all the Medicines you take and that have been prescribed to you. Take any new Medicines after you have completely understood and accept all the possible adverse reactions/side effects.   Please note  You were cared for by a hospitalist during your hospital stay. If you have any questions about your discharge medications or the care you received while you were in the hospital after you are discharged, you can call the unit and asked to speak with the hospitalist on call if the hospitalist that took care of you is not available. Once you are discharged, your primary care physician will handle any further  medical issues. Please note that NO REFILLS for any discharge medications will be authorized once you are discharged, as it is imperative that you return to your primary care physician (or establish a relationship with a primary care physician if you do not have one) for your aftercare needs so that they can reassess your need for medications and monitor your lab values.    Today   CHIEF COMPLAINT:   Chief Complaint  Patient presents with  . Abnormal Lab    HISTORY OF PRESENT ILLNESS:  [name]  is a 74 y.o. male with a known history of end-stage renal disease on hemodialysis Tuesdays, Thursdays, Saturdays who presents to the hospital with complaints of weakness being worn-out. In emergency room, where he was rectally. Negative for blood. However, he was noted to be severely anemic with hemoglobin level VI.4 and was admitted to the hospital for further evaluation and blood transfusions.  He admitted of having some black looking stools few months ago. However, as mentioned above, was guaiac negative in ER. She was transfused packed red blood cells after which she is hemoglobin level improved to 7.6 and remained stable. No bleeding was noted while in the hospital. Patient was consulted by gastroenterologist who felt that patient should follow-up with him for possibly repeating studies. While in the hospital patient was having some problems with diarrhea. However, when attempt was made to collect the stool for cultures,  no specimen was available. Because of the weakness. Patient is to be evaluated by physical therapist and very likely to be referred to skilled nursing facility. Discharged to skilled nursing facility depending on bed availability Discussion by problem 1. Symptomatic anemia of unclear etiology at this time, possibly multifactorial related to end-stage renal disease as well as questionable remote GI bleed , as patient reported black stools few months ago, continue patient on  Protonix. Appreciate gastroenterologist input and recommended further follow-up as outpatient. Patient was transfused packed red blood cells with mild hemoglobin improvement. Continue current supplementation and following hemoglobin, transfuse if declining during hemodialysis sessions.   2. Generalized weakness, thought to be due to anemia, deconditioning. Getting physical therapist evaluation, however, looking in patient's physical abilities, it appears that he would benefit from rehabilitation placement. Getting social work involved 3. End-stage renal disease. Patient is to continue hemodialysis, per nephrology recommendations, continued on Tuesday, Thursday, Saturday schedule. Nephrology input is appreciated 4. COPD, no symptoms , patient's oxygenation is satisfactory 5. Hypokalemia, supplemented orally, resolved 6. Tobacco abuse. Continue nicotine inhaler, no complains 7. Diarrhea, resolved no stool cultures are available    VITAL SIGNS:  Blood pressure 122/72, pulse 68, temperature 97.9 F (36.6 C), temperature source Oral, resp. rate 20, height 6\' 2"  (1.88 m), weight 95.2 kg (209 lb 14.1 oz), SpO2 100 %.  I/O:  No intake or output data in the 24 hours ending 05/08/15 0825  PHYSICAL EXAMINATION:  GENERAL:  74 y.o.-year-old patient lying in the bed with no acute distress.  EYES: Pupils equal, round, reactive to light and accommodation. No scleral icterus. Extraocular muscles intact.  HEENT: Head atraumatic, normocephalic. Oropharynx and nasopharynx clear.  NECK:  Supple, no jugular venous distention. No thyroid enlargement, no tenderness.  LUNGS: Normal breath sounds bilaterally, no wheezing, rales,rhonchi or crepitation. No use of accessory muscles of respiration.  CARDIOVASCULAR: S1, S2 normal. No murmurs, rubs, or gallops.  ABDOMEN: Soft, non-tender, non-distended. Bowel sounds present. No organomegaly or mass.  EXTREMITIES: No pedal edema, cyanosis, or clubbing.  NEUROLOGIC:  Cranial nerves II through XII are intact. Muscle strength 5/5 in all extremities. Sensation intact. Gait not checked.  PSYCHIATRIC: The patient is alert and oriented x 3.  SKIN: No obvious rash, lesion, or ulcer.   DATA REVIEW:   CBC  Recent Labs Lab 05/06/15 1225  05/08/15 0511  WBC 7.3  --   --   HGB 7.4*  < > 7.6*  HCT 22.5*  --  23.9*  PLT 258  --   --   < > = values in this interval not displayed.  Chemistries   Recent Labs Lab 05/05/15 0518 05/06/15 1225  NA 142  --   K 3.1* 3.6  CL 101  --   CO2 31  --   GLUCOSE 124*  --   BUN 44*  --   CREATININE 6.99*  --   CALCIUM 7.6*  --     Cardiac Enzymes No results for input(s): TROPONINI in the last 168 hours.  Microbiology Results  Results for orders placed or performed during the hospital encounter of 05/04/15  MRSA PCR Screening     Status: None   Collection Time: 05/05/15  7:28 PM  Result Value Ref Range Status   MRSA by PCR NEGATIVE NEGATIVE Final    Comment:        The GeneXpert MRSA Assay (FDA approved for NASAL specimens only), is one component of a comprehensive MRSA colonization surveillance program. It is not intended to diagnose MRSA infection nor to guide or monitor treatment for MRSA infections.     RADIOLOGY:  No results found.  EKG:   Orders placed or performed during the hospital encounter of 05/04/15  . EKG 12-Lead  . EKG 12-Lead      Management plans discussed with the patient, family and they are in agreement.  CODE STATUS:     Code Status Orders        Start     Ordered   05/04/15 1859  Full code   Continuous     05/04/15 1859    Advance Directive Documentation        Most Recent Value   Type of Advance Directive  Healthcare Power of Attorney   Pre-existing out of facility DNR order (yellow form or pink MOST form)     "MOST" Form in Place?        TOTAL TIME TAKING CARE OF THIS PATIENT: 40 minutes.    Theodoro Grist M.D on 05/08/2015 at 8:25 AM  Between 7am  to 6pm - Pager - [phone]  After 6pm go to [email or web address] - password [email or web address] [email or web address]  Office  [phone]  CC: Primary care physician; Dion Body, MD

## 2015-05-08 NOTE — Care Management Important Message (Signed)
Important Message  Patient Details  Name: [name] MRN: [id number] Date of Birth: 1941-03-04   Medicare Important Message Given:  Yes-third notification given    Shelbie Ammons, RN 05/08/2015, 12:39 PM

## 2015-05-08 NOTE — Plan of Care (Signed)
Problem: Discharge Progression Outcomes Goal: Discharge plan in place and appropriate Outcome: Progressing Individualization of Care Hx of Bladder and Prostate Cancer, CHF, ERSD on hemodialysis, anemia, HTN, small bowel obstructions, sepsis, GERD, COPD, dementia, incontinence. Admitted for symptomatic anemia.   1. Discharge Plan:  Patient from private home. Patient and family verbalized the need for patient to be discharged to a nursing care facility due to his inability perform ADLs. 2. Pain: Patient without complaints of pain this shift. 3. Hemodynamics: Patient afebrile and VSS this shift. BP 121/67 mmHg  Pulse 74  Temp(Src) 98.4 F (36.9 C) (Oral)  Resp 18  Ht 6\' 2"  (1.88 m)  Wt 209 lb 14.1 oz (95.2 kg)  BMI 26.94 kg/m2  SpO2 100%. 4. Diet: Patient on renal diet with fluid restriction of 1200 mL. Patient acknowledged diet plan and did well with fluid restriction. He has Carb steady supplement x1 this shift. 5. Complications: Patient has dialysis Tuesdays, Thursdays and Saturdays. Patient had dialysis Friday and Saturday.  Plan for dialysis again on Tuesday.  Patient on C-Dif precautions currently, however, has not had stool in two shifts for sample. 6. Activity: Patient unsteady on feet with generalized weakness. In bed this shift with active ROM performed.

## 2015-06-05 ENCOUNTER — Inpatient Hospital Stay: Payer: Medicare Other | Attending: Hematology and Oncology | Admitting: Hematology and Oncology

## 2015-06-05 ENCOUNTER — Telehealth: Payer: Self-pay | Admitting: *Deleted

## 2015-06-05 ENCOUNTER — Encounter (INDEPENDENT_AMBULATORY_CARE_PROVIDER_SITE_OTHER): Payer: Self-pay

## 2015-06-05 VITALS — BP 137/70 | HR 82 | Temp 98.2°F | Ht 74.0 in | Wt 203.0 lb

## 2015-06-05 DIAGNOSIS — J449 Chronic obstructive pulmonary disease, unspecified: Secondary | ICD-10-CM

## 2015-06-05 DIAGNOSIS — Z8701 Personal history of pneumonia (recurrent): Secondary | ICD-10-CM | POA: Diagnosis not present

## 2015-06-05 DIAGNOSIS — F17210 Nicotine dependence, cigarettes, uncomplicated: Secondary | ICD-10-CM | POA: Diagnosis not present

## 2015-06-05 DIAGNOSIS — I120 Hypertensive chronic kidney disease with stage 5 chronic kidney disease or end stage renal disease: Secondary | ICD-10-CM | POA: Diagnosis not present

## 2015-06-05 DIAGNOSIS — I480 Paroxysmal atrial fibrillation: Secondary | ICD-10-CM

## 2015-06-05 DIAGNOSIS — Z79899 Other long term (current) drug therapy: Secondary | ICD-10-CM | POA: Diagnosis not present

## 2015-06-05 DIAGNOSIS — N186 End stage renal disease: Secondary | ICD-10-CM | POA: Diagnosis not present

## 2015-06-05 DIAGNOSIS — Z992 Dependence on renal dialysis: Secondary | ICD-10-CM | POA: Diagnosis not present

## 2015-06-05 DIAGNOSIS — E780 Pure hypercholesterolemia: Secondary | ICD-10-CM | POA: Diagnosis not present

## 2015-06-05 DIAGNOSIS — Z79818 Long term (current) use of other agents affecting estrogen receptors and estrogen levels: Secondary | ICD-10-CM

## 2015-06-05 DIAGNOSIS — D649 Anemia, unspecified: Secondary | ICD-10-CM

## 2015-06-05 DIAGNOSIS — Z8551 Personal history of malignant neoplasm of bladder: Secondary | ICD-10-CM | POA: Diagnosis not present

## 2015-06-05 DIAGNOSIS — C61 Malignant neoplasm of prostate: Secondary | ICD-10-CM

## 2015-06-05 DIAGNOSIS — Z8719 Personal history of other diseases of the digestive system: Secondary | ICD-10-CM

## 2015-06-05 DIAGNOSIS — I5040 Unspecified combined systolic (congestive) and diastolic (congestive) heart failure: Secondary | ICD-10-CM | POA: Insufficient documentation

## 2015-06-05 DIAGNOSIS — K922 Gastrointestinal hemorrhage, unspecified: Secondary | ICD-10-CM

## 2015-06-05 DIAGNOSIS — C679 Malignant neoplasm of bladder, unspecified: Secondary | ICD-10-CM

## 2015-06-05 DIAGNOSIS — D631 Anemia in chronic kidney disease: Secondary | ICD-10-CM

## 2015-06-05 DIAGNOSIS — F0390 Unspecified dementia without behavioral disturbance: Secondary | ICD-10-CM | POA: Diagnosis not present

## 2015-06-05 DIAGNOSIS — K219 Gastro-esophageal reflux disease without esophagitis: Secondary | ICD-10-CM | POA: Insufficient documentation

## 2015-06-05 DIAGNOSIS — N185 Chronic kidney disease, stage 5: Secondary | ICD-10-CM

## 2015-06-05 DIAGNOSIS — D500 Iron deficiency anemia secondary to blood loss (chronic): Secondary | ICD-10-CM | POA: Diagnosis not present

## 2015-06-05 NOTE — Telephone Encounter (Signed)
The only thing they run of the requested labs you faxed over are the cbc and TSH, they cannot draw anything else requested

## 2015-06-05 NOTE — Progress Notes (Signed)
[facility] day:  06/05/2015  Chief Complaint: [name] is a 74 y.o. male with end stage renal disease on dialysis, prostate and bladder cancer, who is referred in consultation by  Dr Matthew Saras for consideration of Procrit.  HPI: The patient states that he has a history of of prostate cancer.  He is followed by Dr. Edrick Oh and [facility].  He is unaware of the stage of his disease.  He denies any prior prostate surgery.  He states that he has been getting shots (Lupron) every 6 months for the past 5 years. Last injection was about 2 months ago.  He does not recall ever having a bone scan. He has never been on a pill (Casodex).  The patient also has a history of bladder cancer. Notes indicate he had a transurethral resection of a bladder tumor. He denies any surgery or radiation.  He denies any hematuria.  The patient has been on dialysis since 06/2014. He states that he has had anemia for a long time up.  He has  received 2-3 transfusions. He is unaware if he has ever received Procrit or Aranesp. He notes some bleeding with black stools.  He has been on oral iron since initiation of dialysis. He describes a good diet. He eats well and " a little of everything". He denies any pica.  He has a history of pneumonia in 03/2015.  Per notes, he has a baseline hemoglobin of around 8.  He was recently admitted to a [facility] on 05/04/2015 was symptomatic anemia. Hemoglobin was 6.4. He received 2 units of blood.  Follow-up hemoglobin was 7.6. Notes indicate that he was hospitalized at the end of 01/2015 with anemia and heme positive stools. Hemoglobin was 5.9.  He received 2 units of blood resulting in a hemoglobin of 8.4.  Anemia was felt multifactorial and secondary to chronic kidney disease as well as some GI blood loss. He was hemodynamically stable and discharged on a proton pump inhibitor.  Notes from Dr. Vira Agar is 05/07/2015 notes that he was not  felt to be a good candidate for sedation (endoscoipy) given his complicated comorbidities. Last colonoscopy in 2012 by Dr. Donnella Sham was notable for poor bowel prep, diverticulosis, and negative for microscopic colitis.   Symptomatically, the patient lives at home. He requires assistance with his activities of daily living (dress, bathroom).  He spend the majority of his day resting.   Past Medical History  Diagnosis Date  . PAF (paroxysmal atrial fibrillation)     a. new onset 12/2014 in the setting of UTI, sepsis, hypotension, and anemia; b. not on long term anticoagulation given anemia; c. family aware of stroke risk, they are ok with this; d. on amiodarone   . Chronic combined systolic and diastolic CHF (congestive heart failure)     a. echo 12/2014: EF 25-30%, anterior wall wall motion abnormalities; b. planned ischemic evaluation once patient is stable medically; c. echo 01/2015: EF 45-50%, no RWMA, mild AT, LA mildly dilated, mod pericardial effusion along LV free wall, no evidence of hemodynamic compromise  . ESRD on hemodialysis     a. Tuesday, Thursday, and Saturdays  . Anemia     a. baseline hgb ~ 8  . History of small bowel obstruction     a. 01/2015  . History of septic shock     a. 01/2015  . GERD (gastroesophageal reflux disease)   . Allergic rhinitis   . COPD (chronic  obstructive pulmonary disease)   . Hypercholesteremia   . Cognitive communication deficit   . Magnesium deficiency   . Dementia   . Iliac aneurysm   . Incontinence   . Hydronephrosis   . Overactive bladder   . Detrusor sphincter dyssynergia   . Mini stroke   . Bladder cancer   . Prostate cancer   . Renal insufficiency   . Hypertension     Past Surgical History  Procedure Laterality Date  . Cystoscopy w/ ureteral stent placement    . Transurethral resection of bladder tumor with gyrus (turbt-gyrus)    . Ureteral stent placement    . Prostate surgery      Removal  . Av fistula placement      left arm     Family History  Problem Relation Age of Onset  . Stroke Mother     Social History:  reports that he has been smoking.  He has never used smokeless tobacco. He reports that he does not drink alcohol or use illicit drugs.  The patient is accompanied by a family member today.  Allergies:  Allergies  Allergen Reactions  . Penicillins Hives, Itching and Swelling    Current Medications: Current Outpatient Prescriptions  Medication Sig Dispense Refill  . albuterol (PROVENTIL HFA;VENTOLIN HFA) 108 (90 BASE) MCG/ACT inhaler Inhale 2 puffs into the lungs every 4 (four) hours as needed for wheezing or shortness of breath.    Marland Kitchen amiodarone (PACERONE) 200 MG tablet Take 1 tablet (200 mg total) by mouth daily. 30 tablet 5  . amLODipine (NORVASC) 2.5 MG tablet Take 2.5 mg by mouth daily.    Marland Kitchen atorvastatin (LIPITOR) 40 MG tablet Take 1 tablet (40 mg total) by mouth daily. 30 tablet 1  . budesonide-formoterol (SYMBICORT) 160-4.5 MCG/ACT inhaler Inhale 2 puffs into the lungs 2 (two) times daily.    . chlorpheniramine-HYDROcodone (TUSSIONEX) 10-8 MG/5ML SUER Take 5 mLs by mouth every 12 (twelve) hours. 140 mL 0  . ferrous sulfate 325 (65 FE) MG tablet Take 1 tablet (325 mg total) by mouth daily with breakfast. 90 tablet 0  . guaiFENesin (MUCINEX) 600 MG 12 hr tablet Take 1 tablet (600 mg total) by mouth 2 (two) times daily. 20 tablet 2  . magnesium oxide (MAG-OX) 400 MG tablet Take 400 mg by mouth every evening.     . nicotine (NICOTROL) 10 MG inhaler Inhale 1 cartridge (1 continuous puffing total) into the lungs as needed for smoking cessation. 42 each 0  . Nutritional Supplements (FEEDING SUPPLEMENT, NEPRO CARB STEADY,) LIQD Take 237 mLs by mouth 2 (two) times daily between meals. (Patient not taking: Reported on 05/04/2015) 30 Can 0  . pantoprazole (PROTONIX) 40 MG tablet Take 1 tablet (40 mg total) by mouth 2 (two) times daily. 30 tablet 0  . polyethylene glycol (MIRALAX / GLYCOLAX) packet Take 17 g  by mouth daily. (Patient not taking: Reported on 04/10/2015) 14 each 0  . sevelamer carbonate (RENVELA) 800 MG tablet Take 800 mg by mouth 3 (three) times daily before meals.     . tiotropium (SPIRIVA) 18 MCG inhalation capsule Place 1 capsule (18 mcg total) into inhaler and inhale daily. 30 capsule 12   No current facility-administered medications for this visit.    Review of Systems:  GENERAL:  Feels "ok".  No fevers, sweats or weight loss. PERFORMANCE STATUS (ECOG):  3 HEENT:  Poor vision.  No runny nose, sore throat, mouth sores or tenderness. Lungs: Periodic shortness of breath.  Dry cough.  No hemoptysis. Cardiac:  No chest pain, palpitations, orthopnea, or PND. GI:  Diarrhea, constipation.  Dark stools, sometimes black.  No abdominal pain.  No nausea, vomiting, or hematochezia. GU:  No urgency, frequency, dysuria, or hematuria. Musculoskeletal:  No back pain.  No joint pain.  No muscle tenderness. Extremities:  No pain or swelling. Skin:  No rashes or skin changes. Neuro:  No headache, numbness or weakness, balance or coordination issues. Endocrine:  No diabetes, thyroid issues, hot flashes or night sweats. Psych:  No mood changes, depression or anxiety. Pain:  No focal pain. Review of systems:  All other systems reviewed and found to be negative.  Physical Exam: Blood pressure 137/70, pulse 82, temperature 98.2 F (36.8 C), temperature source Oral, height 6' 2"  (1.88 m), weight 203 lb (92.08 kg). GENERAL:  Well developed, well nourished, sitting comfortably in a wheelchair in the exam room in no acute distress. MENTAL STATUS:  Alert and oriented to person, place and time. HEAD:  White hair.  Lu Duffel with stubble.  Normocephalic, atraumatic, face symmetric, no Cushingoid features. EYES:  Dark rimmed glasses.  Brown eyes.  Pupils equal round and reactive to light and accomodation.  No conjunctivitis or scleral icterus. ENT:  Oropharynx clear without lesion.  Missing teeth.   Tongue normal. Mucous membranes moist.  RESPIRATORY:  Clear to auscultation without rales, wheezes or rhonchi. CARDIOVASCULAR:  Regular rate and rhythm without murmur, rub or gallop. ABDOMEN:  Soft, non-tender, with active bowel sounds, and no hepatosplenomegaly.  No masses. SKIN:  No rashes, ulcers or lesions. EXTREMITIES: No edema, no skin discoloration or tenderness.  No palpable cords. LYMPH NODES: No palpable cervical, supraclavicular, axillary or inguinal adenopathy  NEUROLOGICAL: Unremarkable. PSYCH:  Appropriate.  No visits with results within 3 Day(s) from this visit. Latest known visit with results is:  Admission on 05/04/2015, Discharged on 05/08/2015  Component Date Value Ref Range Status  . WBC 05/04/2015 6.9  3.8 - 10.6 K/uL Final  . RBC 05/04/2015 2.13* 4.40 - 5.90 MIL/uL Final  . Hemoglobin 05/04/2015 6.4* 13.0 - 18.0 g/dL Final  . HCT 05/04/2015 19.1* 40.0 - 52.0 % Final  . MCV 05/04/2015 89.6  80.0 - 100.0 fL Final  . MCH 05/04/2015 29.8  26.0 - 34.0 pg Final  . MCHC 05/04/2015 33.3  32.0 - 36.0 g/dL Final  . RDW 05/04/2015 16.4* 11.5 - 14.5 % Final  . Platelets 05/04/2015 311  150 - 440 K/uL Final  . Neutrophils Relative % 05/04/2015 66   Final  . Neutro Abs 05/04/2015 4.6  1.4 - 6.5 K/uL Final  . Lymphocytes Relative 05/04/2015 21   Final  . Lymphs Abs 05/04/2015 1.5  1.0 - 3.6 K/uL Final  . Monocytes Relative 05/04/2015 9   Final  . Monocytes Absolute 05/04/2015 0.6  0.2 - 1.0 K/uL Final  . Eosinophils Relative 05/04/2015 3   Final  . Eosinophils Absolute 05/04/2015 0.2  0 - 0.7 K/uL Final  . Basophils Relative 05/04/2015 1   Final  . Basophils Absolute 05/04/2015 0.0  0 - 0.1 K/uL Final  . Sodium 05/04/2015 141  135 - 145 mmol/L Final  . Potassium 05/04/2015 3.1* 3.5 - 5.1 mmol/L Final  . Chloride 05/04/2015 98* 101 - 111 mmol/L Final  . CO2 05/04/2015 31  22 - 32 mmol/L Final  . Glucose, Bld 05/04/2015 137* 65 - 99 mg/dL Final  . BUN 05/04/2015 37* 6 - 20  mg/dL Final  . Creatinine, Ser 05/04/2015  6.81* 0.61 - 1.24 mg/dL Final  . Calcium 05/04/2015 7.8* 8.9 - 10.3 mg/dL Final  . GFR calc non Af Amer 05/04/2015 7* >60 mL/min Final  . GFR calc Af Amer 05/04/2015 8* >60 mL/min Final   Comment: (NOTE) The eGFR has been calculated using the CKD EPI equation. This calculation has not been validated in all clinical situations. eGFR's persistently <60 mL/min signify possible Chronic Kidney Disease.   . Anion gap 05/04/2015 12  5 - 15 Final  . Order Confirmation 05/04/2015 ORDER PROCESSED BY BLOOD BANK   Final  . ABO/RH(D) 05/04/2015 A NEG   Final  . Antibody Screen 05/04/2015 NEG   Final  . Sample Expiration 05/04/2015 05/07/2015   Final  . Unit Number 05/04/2015 [id number]   Final  . Blood Component Type 05/04/2015 RCLI PHER 2   Final  . Unit division 05/04/2015 00   Final  . Status of Unit 05/04/2015 ISSUED,FINAL   Final  . Transfusion Status 05/04/2015 OK TO TRANSFUSE   Final  . Crossmatch Result 05/04/2015 Compatible   Final  . Unit Number 05/04/2015 [id number]   Final  . Blood Component Type 05/04/2015 RBC, LR IRR   Final  . Unit division 05/04/2015 00   Final  . Status of Unit 05/04/2015 REL FROM [facility]   Final  . Transfusion Status 05/04/2015 OK TO TRANSFUSE   Final  . Crossmatch Result 05/04/2015 Compatible   Final  . Unit Number 05/04/2015 [id number]   Final  . Blood Component Type 05/04/2015 RED CELLS,LR   Final  . Unit division 05/04/2015 00   Final  . Status of Unit 05/04/2015 ISSUED,FINAL   Final  . Transfusion Status 05/04/2015 OK TO TRANSFUSE   Final  . Crossmatch Result 05/04/2015 Compatible   Final  . Ferritin 05/04/2015 838* 24 - 336 ng/mL Final  . Iron 05/04/2015 23* 45 - 182 ug/dL Final  . TIBC 05/04/2015 218* 250 - 450 ug/dL Final  . Saturation Ratios 05/04/2015 11* 17.9 - 39.5 % Final  . UIBC 05/04/2015 195   Final  . Sodium 05/05/2015 142  135 - 145 mmol/L Final  . Potassium 05/05/2015 3.1* 3.5 - 5.1  mmol/L Final  . Chloride 05/05/2015 101  101 - 111 mmol/L Final  . CO2 05/05/2015 31  22 - 32 mmol/L Final  . Glucose, Bld 05/05/2015 124* 65 - 99 mg/dL Final  . BUN 05/05/2015 44* 6 - 20 mg/dL Final  . Creatinine, Ser 05/05/2015 6.99* 0.61 - 1.24 mg/dL Final  . Calcium 05/05/2015 7.6* 8.9 - 10.3 mg/dL Final  . GFR calc non Af Amer 05/05/2015 7* >60 mL/min Final  . GFR calc Af Amer 05/05/2015 8* >60 mL/min Final   Comment: (NOTE) The eGFR has been calculated using the CKD EPI equation. This calculation has not been validated in all clinical situations. eGFR's persistently <60 mL/min signify possible Chronic Kidney Disease.   . Anion gap 05/05/2015 10  5 - 15 Final  . WBC 05/05/2015 6.9  3.8 - 10.6 K/uL Final  . RBC 05/05/2015 2.31* 4.40 - 5.90 MIL/uL Final  . Hemoglobin 05/05/2015 6.8* 13.0 - 18.0 g/dL Final  . HCT 05/05/2015 20.6* 40.0 - 52.0 % Final  . MCV 05/05/2015 89.2  80.0 - 100.0 fL Final  . MCH 05/05/2015 29.5  26.0 - 34.0 pg Final  . MCHC 05/05/2015 33.1  32.0 - 36.0 g/dL Final  . RDW 05/05/2015 15.8* 11.5 - 14.5 % Final  . Platelets 05/05/2015 295  150 -  440 K/uL Final  . Vitamin B-12 05/04/2015 795  180 - 914 pg/mL Final   Comment: (NOTE) This assay is not validated for testing neonatal or myeloproliferative syndrome specimens for Vitamin B12 levels. Performed at [facility]   . MRSA by PCR 05/05/2015 NEGATIVE  NEGATIVE Final   Comment:        The GeneXpert MRSA Assay (FDA approved for NASAL specimens only), is one component of a comprehensive MRSA colonization surveillance program. It is not intended to diagnose MRSA infection nor to guide or monitor treatment for MRSA infections.   . Order Confirmation 05/05/2015 ORDER PROCESSED BY BLOOD BANK   Final  . Potassium 05/06/2015 3.6  3.5 - 5.1 mmol/L Final  . Hepatitis B Surface Ag 05/05/2015 Negative  Negative Final   Comment: (NOTE) Performed At: [facility] [place], [place]  [id number] Lindon Romp MD [id number]   . Hepatitis B-Post 05/05/2015 <3.1* Immunity>9.9 mIU/mL Final   Comment: (NOTE)  Status of Immunity                     Anti-HBs Level  ------------------                     -------------- Inconsistent with Immunity                   0.0 - 9.9 Consistent with Immunity                          >9.9 Performed At: [facility] [place], [place] [id number] Lindon Romp MD [id number]   . WBC 05/06/2015 7.3  3.8 - 10.6 K/uL Final  . RBC 05/06/2015 2.57* 4.40 - 5.90 MIL/uL Final  . Hemoglobin 05/06/2015 7.4* 13.0 - 18.0 g/dL Final  . HCT 05/06/2015 22.5* 40.0 - 52.0 % Final  . MCV 05/06/2015 87.8  80.0 - 100.0 fL Final  . MCH 05/06/2015 28.9  26.0 - 34.0 pg Final  . MCHC 05/06/2015 33.0  32.0 - 36.0 g/dL Final  . RDW 05/06/2015 15.7* 11.5 - 14.5 % Final  . Platelets 05/06/2015 258  150 - 440 K/uL Final  . Hemoglobin 05/07/2015 7.6* 13.0 - 18.0 g/dL Final  . Vitamin B-12 05/08/2015 511  180 - 914 pg/mL Final   Comment: (NOTE) This assay is not validated for testing neonatal or myeloproliferative syndrome specimens for Vitamin B12 levels. Performed at [facility]   . Iron 05/08/2015 20* 45 - 182 ug/dL Final  . TIBC 05/08/2015 202* 250 - 450 ug/dL Final  . Saturation Ratios 05/08/2015 10* 17.9 - 39.5 % Final  . UIBC 05/08/2015 182   Final  . Retic Ct Pct 05/08/2015 1.1  0.4 - 3.1 % Final  . RBC. 05/08/2015 2.65* 4.40 - 5.90 MIL/uL Final  . Retic Count, Manual 05/08/2015 29.2  19.0 - 183.0 K/uL Final  . Hemoglobin 05/08/2015 7.6* 13.0 - 18.0 g/dL Final  . HCT 05/08/2015 23.9* 40.0 - 52.0 % Final  . C Diff antigen 05/08/2015 POSITIVE* NEGATIVE Final  . C Diff toxin 05/08/2015 NEGATIVE  NEGATIVE Final  . C Diff interpretation 05/08/2015 Negative for toxigenic C. difficile. Toxin gene and active toxin production not detected. May be a nontoxigenic strain of C. difficile bacteria present, lacking the ability  to produce toxin.   Final  . Toxigenic C Difficile by pcr 05/08/2015 NEGATIVE  NEGATIVE Final    Assessment:  [name] is a 74 y.o. male with end stage renal disease on dialysis since 06/2014.  The etiology of his renal disease is unclear.  He is unsure if he receives Procrit.  He has a history of prostate cancer.  Stage of disease is unknown.  Per his report, he receives Lupron every 6 months.  He also has a history of bladder cancer and is status post transurethral resection of bladder tumor (TURBT). He denies any hematuria.  He had a colonoscopy in 2012 which was negative. He is not felt to be a good candidate for sedation (further endoscopies) secondary to his multiple morbidities.  The etiology of his anemia is likely felt multifactorial. He has anemia of chronic renal disease and would be a good candidate for Procrit. He appears to have some component of GI blood loss.  Diet is good.  He is on oral iron.  B12 was normal on 05/04/2015.  Plan: 1. Labs slip for dialysis:  CBC with diff, ferritin, ESR, folate, retic, Coombs, ANA, TSH, PSA. 2. Release of information (ROI) from urology and nephrology. 3. RTC in 1 week for review of work-up and discussion regarding direction of therapy.   Lequita Asal, MD  06/05/2015, 2:24 PM

## 2015-06-05 NOTE — Progress Notes (Signed)
Patient is here for initial visit with Dr.  Patient states that he is not sure who referred him to [facility] or why he is here "maybe it is because of bladder cancer"

## 2015-06-06 ENCOUNTER — Emergency Department: Payer: Medicare Other

## 2015-06-06 ENCOUNTER — Encounter: Payer: Self-pay | Admitting: Emergency Medicine

## 2015-06-06 ENCOUNTER — Inpatient Hospital Stay
Admission: EM | Admit: 2015-06-06 | Discharge: 2015-06-10 | DRG: 682 | Disposition: A | Discharge status: Home Health | Payer: Medicare Other | Attending: Internal Medicine | Admitting: Internal Medicine

## 2015-06-06 DIAGNOSIS — N2581 Secondary hyperparathyroidism of renal origin: Secondary | ICD-10-CM | POA: Diagnosis present

## 2015-06-06 DIAGNOSIS — F0390 Unspecified dementia without behavioral disturbance: Secondary | ICD-10-CM | POA: Diagnosis present

## 2015-06-06 DIAGNOSIS — J441 Chronic obstructive pulmonary disease with (acute) exacerbation: Secondary | ICD-10-CM | POA: Diagnosis present

## 2015-06-06 DIAGNOSIS — K219 Gastro-esophageal reflux disease without esophagitis: Secondary | ICD-10-CM | POA: Diagnosis present

## 2015-06-06 DIAGNOSIS — Z87891 Personal history of nicotine dependence: Secondary | ICD-10-CM | POA: Diagnosis not present

## 2015-06-06 DIAGNOSIS — K5900 Constipation, unspecified: Secondary | ICD-10-CM | POA: Diagnosis present

## 2015-06-06 DIAGNOSIS — E785 Hyperlipidemia, unspecified: Secondary | ICD-10-CM | POA: Diagnosis present

## 2015-06-06 DIAGNOSIS — J189 Pneumonia, unspecified organism: Secondary | ICD-10-CM | POA: Diagnosis present

## 2015-06-06 DIAGNOSIS — Z880 Allergy status to penicillin: Secondary | ICD-10-CM

## 2015-06-06 DIAGNOSIS — R197 Diarrhea, unspecified: Secondary | ICD-10-CM | POA: Diagnosis present

## 2015-06-06 DIAGNOSIS — Z79899 Other long term (current) drug therapy: Secondary | ICD-10-CM | POA: Diagnosis not present

## 2015-06-06 DIAGNOSIS — C679 Malignant neoplasm of bladder, unspecified: Secondary | ICD-10-CM | POA: Diagnosis present

## 2015-06-06 DIAGNOSIS — IMO0002 Reserved for concepts with insufficient information to code with codable children: Secondary | ICD-10-CM

## 2015-06-06 DIAGNOSIS — Z8673 Personal history of transient ischemic attack (TIA), and cerebral infarction without residual deficits: Secondary | ICD-10-CM

## 2015-06-06 DIAGNOSIS — N186 End stage renal disease: Secondary | ICD-10-CM | POA: Diagnosis present

## 2015-06-06 DIAGNOSIS — D649 Anemia, unspecified: Secondary | ICD-10-CM | POA: Diagnosis present

## 2015-06-06 DIAGNOSIS — I120 Hypertensive chronic kidney disease with stage 5 chronic kidney disease or end stage renal disease: Secondary | ICD-10-CM | POA: Diagnosis present

## 2015-06-06 DIAGNOSIS — Z9981 Dependence on supplemental oxygen: Secondary | ICD-10-CM

## 2015-06-06 DIAGNOSIS — Y95 Nosocomial condition: Secondary | ICD-10-CM | POA: Diagnosis present

## 2015-06-06 DIAGNOSIS — I5042 Chronic combined systolic (congestive) and diastolic (congestive) heart failure: Secondary | ICD-10-CM | POA: Diagnosis present

## 2015-06-06 DIAGNOSIS — Z992 Dependence on renal dialysis: Secondary | ICD-10-CM | POA: Diagnosis not present

## 2015-06-06 DIAGNOSIS — D631 Anemia in chronic kidney disease: Secondary | ICD-10-CM | POA: Diagnosis present

## 2015-06-06 DIAGNOSIS — I480 Paroxysmal atrial fibrillation: Secondary | ICD-10-CM | POA: Diagnosis present

## 2015-06-06 DIAGNOSIS — T82330A Leakage of aortic (bifurcation) graft (replacement), initial encounter: Secondary | ICD-10-CM

## 2015-06-06 DIAGNOSIS — J90 Pleural effusion, not elsewhere classified: Secondary | ICD-10-CM

## 2015-06-06 DIAGNOSIS — R0602 Shortness of breath: Secondary | ICD-10-CM | POA: Diagnosis present

## 2015-06-06 LAB — COMPREHENSIVE METABOLIC PANEL
ALK PHOS: 82 U/L (ref 38–126)
ALT: 8 U/L — ABNORMAL LOW (ref 17–63)
AST: 21 U/L (ref 15–41)
Albumin: 2.8 g/dL — ABNORMAL LOW (ref 3.5–5.0)
Anion gap: 13 (ref 5–15)
BUN: 40 mg/dL — AB (ref 6–20)
CHLORIDE: 100 mmol/L — AB (ref 101–111)
CO2: 29 mmol/L (ref 22–32)
CREATININE: 4.59 mg/dL — AB (ref 0.61–1.24)
Calcium: 8.5 mg/dL — ABNORMAL LOW (ref 8.9–10.3)
GFR calc Af Amer: 13 mL/min — ABNORMAL LOW (ref >60)
GFR calc non Af Amer: 11 mL/min — ABNORMAL LOW (ref >60)
Glucose, Bld: 125 mg/dL — ABNORMAL HIGH (ref 65–99)
POTASSIUM: 4.1 mmol/L (ref 3.5–5.1)
Sodium: 142 mmol/L (ref 135–145)
Total Bilirubin: 1 mg/dL (ref 0.3–1.2)
Total Protein: 6.6 g/dL (ref 6.5–8.1)

## 2015-06-06 LAB — TROPONIN I
Troponin I: 0.03 ng/mL (ref <0.031)
Troponin I: 0.05 ng/mL — ABNORMAL HIGH (ref <0.031)
Troponin I: 0.06 ng/mL — ABNORMAL HIGH (ref <0.031)

## 2015-06-06 LAB — CBC
HEMATOCRIT: 20.5 % — AB (ref 40.0–52.0)
HEMOGLOBIN: 6.4 g/dL — AB (ref 13.0–18.0)
MCH: 28.8 pg (ref 26.0–34.0)
MCHC: 31.1 g/dL — AB (ref 32.0–36.0)
MCV: 92.4 fL (ref 80.0–100.0)
Platelets: 287 10*3/uL (ref 150–440)
RBC: 2.22 MIL/uL — ABNORMAL LOW (ref 4.40–5.90)
RDW: 17.1 % — ABNORMAL HIGH (ref 11.5–14.5)
WBC: 13.3 10*3/uL — ABNORMAL HIGH (ref 3.8–10.6)

## 2015-06-06 LAB — PREPARE RBC (CROSSMATCH)

## 2015-06-06 MED ORDER — IPRATROPIUM-ALBUTEROL 0.5-2.5 (3) MG/3ML IN SOLN
3.0000 mL | RESPIRATORY_TRACT | Status: DC
  Administered 2015-06-06 – 2015-06-07 (×5): 3 mL via RESPIRATORY_TRACT
  Filled 2015-06-06 (×5): qty 3

## 2015-06-06 MED ORDER — ATORVASTATIN CALCIUM 20 MG PO TABS
40.0000 mg | ORAL_TABLET | Freq: Every day | ORAL | Status: DC
  Administered 2015-06-07 – 2015-06-10 (×4): 40 mg via ORAL
  Filled 2015-06-06 (×4): qty 2

## 2015-06-06 MED ORDER — IPRATROPIUM-ALBUTEROL 0.5-2.5 (3) MG/3ML IN SOLN
3.0000 mL | Freq: Once | RESPIRATORY_TRACT | Status: AC
Start: 2015-06-06 — End: 2015-06-06
  Administered 2015-06-06: 3 mL via RESPIRATORY_TRACT
  Filled 2015-06-06: qty 3

## 2015-06-06 MED ORDER — SODIUM CHLORIDE 0.9 % IV SOLN
Freq: Once | INTRAVENOUS | Status: AC
  Administered 2015-06-06: 22:00:00 via INTRAVENOUS

## 2015-06-06 MED ORDER — TIOTROPIUM BROMIDE MONOHYDRATE 18 MCG IN CAPS
18.0000 ug | ORAL_CAPSULE | Freq: Every day | RESPIRATORY_TRACT | Status: DC
  Administered 2015-06-07 – 2015-06-10 (×4): 18 ug via RESPIRATORY_TRACT
  Filled 2015-06-06: qty 5

## 2015-06-06 MED ORDER — DEXTROSE 5 % IV SOLN
2.0000 g | Freq: Once | INTRAVENOUS | Status: AC
  Administered 2015-06-06: 2 g via INTRAVENOUS
  Filled 2015-06-06 (×3): qty 2

## 2015-06-06 MED ORDER — METHYLPREDNISOLONE SODIUM SUCC 40 MG IJ SOLR
40.0000 mg | Freq: Two times a day (BID) | INTRAMUSCULAR | Status: DC
  Administered 2015-06-07 – 2015-06-08 (×3): 40 mg via INTRAVENOUS
  Filled 2015-06-06 (×3): qty 1

## 2015-06-06 MED ORDER — ONDANSETRON HCL 4 MG PO TABS: 4.0000 mg | ORAL_TABLET | Freq: Four times a day (QID) | ORAL | Status: DC | PRN

## 2015-06-06 MED ORDER — ONDANSETRON HCL 4 MG/2ML IJ SOLN: 4.0000 mg | Freq: Four times a day (QID) | INTRAMUSCULAR | Status: DC | PRN

## 2015-06-06 MED ORDER — SENNOSIDES-DOCUSATE SODIUM 8.6-50 MG PO TABS: 1.0000 | ORAL_TABLET | Freq: Every evening | ORAL | Status: DC | PRN

## 2015-06-06 MED ORDER — DEXTROSE 5 % IV SOLN
500.0000 mg | Freq: Three times a day (TID) | INTRAVENOUS | Status: DC
  Administered 2015-06-07 (×2): 500 mg via INTRAVENOUS
  Filled 2015-06-06 (×7): qty 0.5

## 2015-06-06 MED ORDER — FERROUS SULFATE 325 (65 FE) MG PO TABS
325.0000 mg | ORAL_TABLET | Freq: Every day | ORAL | Status: DC
  Administered 2015-06-07 – 2015-06-10 (×4): 325 mg via ORAL
  Filled 2015-06-06 (×4): qty 1

## 2015-06-06 MED ORDER — AMIODARONE HCL 200 MG PO TABS
200.0000 mg | ORAL_TABLET | Freq: Every day | ORAL | Status: DC
  Administered 2015-06-06 – 2015-06-09 (×4): 200 mg via ORAL
  Filled 2015-06-06 (×4): qty 1

## 2015-06-06 MED ORDER — PANTOPRAZOLE SODIUM 40 MG PO TBEC
40.0000 mg | DELAYED_RELEASE_TABLET | Freq: Two times a day (BID) | ORAL | Status: DC
  Administered 2015-06-06 – 2015-06-10 (×8): 40 mg via ORAL
  Filled 2015-06-06 (×8): qty 1

## 2015-06-06 MED ORDER — ALBUTEROL SULFATE (2.5 MG/3ML) 0.083% IN NEBU
2.5000 mg | INHALATION_SOLUTION | RESPIRATORY_TRACT | Status: DC | PRN
Start: 2015-06-06 — End: 2015-06-10

## 2015-06-06 MED ORDER — ALUM & MAG HYDROXIDE-SIMETH 200-200-20 MG/5ML PO SUSP: 30.0000 mL | Freq: Four times a day (QID) | ORAL | Status: DC | PRN

## 2015-06-06 MED ORDER — SODIUM CHLORIDE 0.9 % IV SOLN: INTRAVENOUS | Status: DC

## 2015-06-06 MED ORDER — GUAIFENESIN ER 600 MG PO TB12
600.0000 mg | ORAL_TABLET | Freq: Two times a day (BID) | ORAL | Status: DC
  Administered 2015-06-06 – 2015-06-10 (×8): 600 mg via ORAL
  Filled 2015-06-06 (×8): qty 1

## 2015-06-06 MED ORDER — NEPRO/CARBSTEADY PO LIQD
237.0000 mL | Freq: Two times a day (BID) | ORAL | Status: DC
  Administered 2015-06-07 – 2015-06-10 (×6): 237 mL via ORAL

## 2015-06-06 MED ORDER — SODIUM CHLORIDE 0.9 % IJ SOLN
3.0000 mL | Freq: Two times a day (BID) | INTRAMUSCULAR | Status: DC
  Administered 2015-06-06 – 2015-06-10 (×8): 3 mL via INTRAVENOUS

## 2015-06-06 MED ORDER — POLYETHYLENE GLYCOL 3350 17 G PO PACK
17.0000 g | PACK | Freq: Every day | ORAL | Status: DC
  Administered 2015-06-07 – 2015-06-09 (×3): 17 g via ORAL
  Filled 2015-06-06 (×4): qty 1

## 2015-06-06 MED ORDER — ACETAMINOPHEN 650 MG RE SUPP: 650.0000 mg | Freq: Four times a day (QID) | RECTAL | Status: DC | PRN

## 2015-06-06 MED ORDER — ACETAMINOPHEN 325 MG PO TABS
650.0000 mg | ORAL_TABLET | Freq: Four times a day (QID) | ORAL | Status: DC | PRN
Start: 2015-06-06 — End: 2015-06-10

## 2015-06-06 MED ORDER — SEVELAMER CARBONATE 800 MG PO TABS
800.0000 mg | ORAL_TABLET | Freq: Three times a day (TID) | ORAL | Status: DC
  Administered 2015-06-07 – 2015-06-10 (×8): 800 mg via ORAL
  Filled 2015-06-06 (×8): qty 1

## 2015-06-06 MED ORDER — HYDROCODONE-ACETAMINOPHEN 5-325 MG PO TABS: 1.0000 | ORAL_TABLET | ORAL | Status: DC | PRN

## 2015-06-06 MED ORDER — NICOTINE 10 MG IN INHA: 1.0000 | RESPIRATORY_TRACT | Status: DC | PRN

## 2015-06-06 MED ORDER — ALBUTEROL SULFATE HFA 108 (90 BASE) MCG/ACT IN AERS: 2.0000 | INHALATION_SPRAY | RESPIRATORY_TRACT | Status: DC | PRN

## 2015-06-06 MED ORDER — MAGNESIUM OXIDE 400 MG PO TABS
400.0000 mg | ORAL_TABLET | Freq: Every evening | ORAL | Status: DC
  Administered 2015-06-06 – 2015-06-09 (×4): 400 mg via ORAL
  Filled 2015-06-06 (×9): qty 1

## 2015-06-06 MED ORDER — BUDESONIDE-FORMOTEROL FUMARATE 160-4.5 MCG/ACT IN AERO
2.0000 | INHALATION_SPRAY | Freq: Two times a day (BID) | RESPIRATORY_TRACT | Status: DC
  Administered 2015-06-06 – 2015-06-10 (×8): 2 via RESPIRATORY_TRACT
  Filled 2015-06-06: qty 6

## 2015-06-06 MED ORDER — AMLODIPINE BESYLATE 5 MG PO TABS
2.5000 mg | ORAL_TABLET | Freq: Every day | ORAL | Status: DC
  Administered 2015-06-06 – 2015-06-09 (×4): 2.5 mg via ORAL
  Filled 2015-06-06 (×4): qty 1

## 2015-06-06 NOTE — ED Notes (Signed)
Report given to Heather, RN

## 2015-06-06 NOTE — Telephone Encounter (Signed)
  Are we getting him to come back for the ones they can not do?  M

## 2015-06-06 NOTE — ED Notes (Signed)
Pt arrives via EMS from home. Per EMS report, pt's family stated pt became SOB while taking a bath this morning. Pt is Tues, Thurs, Sat dialysis pt. Wheezing noted in base of lungs. Pt AAOX4. NAD noted. RR even and nonlabored at this time. Pt received treatment en route by EMS. Pt denies pain. Pt c/o SOB.

## 2015-06-06 NOTE — Progress Notes (Signed)
Subjective:  Patient presents to the emergency room after having an episode of explosive diarrhea this morning. He then started to have shortness of breath.  He decided to come to the ER.  He missed his dialysis this morning. He was found to have severe anemia with hemoglobin down to 6.4 He scheduled to receive a blood transfusion.  Nephrology team has been requested to evaluate for dialysis   Objective:  Vital signs in last 24 hours:  Temp:  [96.9 F (36.1 C)] 96.9 F (36.1 C) (09/13 0648) Pulse Rate:  [74-87] 87 (09/13 1331) Resp:  [21-34] 34 (09/13 1331) BP: (116-150)/(70-95) 145/86 mmHg (09/13 1331) SpO2:  [92 %-100 %] 99 % (09/13 1331) Weight:  [92.08 kg (203 lb)] 92.08 kg (203 lb) (09/13 0648)  Weight change:  Filed Weights   06/06/15 0648  Weight: 92.08 kg (203 lb)    Intake/Output:   No intake or output data in the 24 hours ending 06/06/15 1424   Physical Exam: General: no distress, laying in the bed  HEENT anicteric, moist mucous membranes  Neck supple,  Pulm/lungs Mild diffuse basilar crackles, normal effort  CVS/Heart Regular rhythm, no rub or gallop  Abdomen:  Soft, mildly distended, nontender  Extremities: Trace to 1+ pitting edema  Neurologic: Alert, able to follow commands  Skin: Chronic changes over the legs  Access: Left arm AV fistula       Basic Metabolic Panel:   Recent Labs Lab 06/06/15 0727  NA 142  K 4.1  CL 100*  CO2 29  GLUCOSE 125*  BUN 40*  CREATININE 4.59*  CALCIUM 8.5*     CBC:  Recent Labs Lab 06/06/15 0727  WBC 13.3*  HGB 6.4*  HCT 20.5*  MCV 92.4  PLT 287      Microbiology:  Recent Results (from the past 720 hour(s))  C difficile quick scan w PCR reflex     Status: Abnormal   Collection Time: 05/08/15  2:49 PM  Result Value Ref Range Status   C Diff antigen POSITIVE (A) NEGATIVE Final   C Diff toxin NEGATIVE NEGATIVE Final   C Diff interpretation   Final    Negative for toxigenic C. difficile. Toxin  gene and active toxin production not detected. May be a nontoxigenic strain of C. difficile bacteria present, lacking the ability to produce toxin.  Clostridium Difficile by PCR     Status: None   Collection Time: 05/08/15  2:49 PM  Result Value Ref Range Status   Toxigenic C Difficile by pcr NEGATIVE NEGATIVE Final    Coagulation Studies: No results for input(s): LABPROT, INR in the last 72 hours.  Urinalysis: No results for input(s): COLORURINE, LABSPEC, PHURINE, GLUCOSEU, HGBUR, BILIRUBINUR, KETONESUR, PROTEINUR, UROBILINOGEN, NITRITE, LEUKOCYTESUR in the last 72 hours.  Invalid input(s): APPERANCEUR    Imaging: Ct Chest Wo Contrast  06/06/2015   CLINICAL DATA:  Shortness of breath today. History of hemodialysis, prostate and bladder cancer. Initial encounter.  EXAM: CT CHEST WITHOUT CONTRAST  TECHNIQUE: Multidetector CT imaging of the chest was performed following the standard protocol without IV contrast.  COMPARISON:  Radiographs 06/06/2015 and 03/20/2015. CT 01/26/2015 and 09/04/2009. Abdominal CTA 03/21/2015.  FINDINGS: Mediastinum/Nodes: There are no enlarged mediastinal, hilar or axillary lymph nodes.Hilar assessment remains limited by the lack of intravenous contrast, although the hilar contours are unchanged. The thyroid gland, trachea and esophagus demonstrate no significant findings. The heart size is normal. There is no significant residual pericardial effusion. There is diffuse atherosclerosis of the  aorta, great vessels and coronary arteries. Dilatation of the distal arch to 5.2 cm is similar to the prior study.  Lungs/Pleura: There are dependent right-greater-than-left pleural effusions with interval enlargement of the right pleural effusion. This effusion may be partially loculated. There are dependent opacities in both lungs consistent with atelectasis. Mild upper lobe paraseptal emphysematous changes are noted.  Upper abdomen: Bilateral adrenal nodularity appears unchanged from  2010, most consistent with adenomas. There is collecting system dilatation within the left kidney upper pole, unchanged from 4 months ago but new from 2010, as seen on CTA 3 months ago.  Musculoskeletal/Chest wall: Bilateral gynecomastia again noted. Scattered sclerotic lesions within the spine are grossly stable, largest at T12. No acute osseous findings seen.  IMPRESSION: 1. Interval enlargement of right pleural effusion, potentially partially loculated. Smaller left pleural effusion appears unchanged. 2. Interval resolution of pericardial effusion. 3. Mildly increased in bibasilar atelectasis. 4. Diffuse atherosclerosis with grossly stable dilatation of the distal aortic arch. 5. Stable bilateral adrenal nodules.   Electronically Signed   By: Richardean Sale M.D.   On: 06/06/2015 09:50   Dg Chest Portable 1 View  06/06/2015   CLINICAL DATA:  Shortness of breath today, the patient does undergo dialysis, some wheezing  EXAM: PORTABLE CHEST - 1 VIEW  COMPARISON:  Portable chest x-ray of 03/20/2015  FINDINGS: Aeration of the lung bases has improved somewhat. There are still hazy opacities at both lung bases consistent with atelectasis and effusions with pneumonia difficult to exclude. Also, there may be mild pulmonary vascular congestion present. A two-view chest x-ray would be helpful if possible. Cardiomegaly is stable.  IMPRESSION: 1. Slightly better aeration. 2. Bibasilar opacities consistent with atelectasis and effusions. Cannot exclude pneumonia. 3. Question mild pulmonary vascular congestion.   Electronically Signed   By: Ivar Drape M.D.   On: 06/06/2015 08:08     Medications:   . sodium chloride    . sodium chloride     . amiodarone  200 mg Oral Daily  . amLODipine  2.5 mg Oral Daily  . atorvastatin  40 mg Oral Daily  . budesonide-formoterol  2 puff Inhalation BID  . feeding supplement (NEPRO CARB STEADY)  237 mL Oral BID BM  . [START ON 06/07/2015] ferrous sulfate  325 mg Oral Q breakfast   . guaiFENesin  600 mg Oral BID  . ipratropium-albuterol  3 mL Nebulization Q4H  . magnesium oxide  400 mg Oral QPM  . methylPREDNISolone (SOLU-MEDROL) injection  40 mg Intravenous Q12H  . pantoprazole  40 mg Oral BID  . polyethylene glycol  17 g Oral Daily  . sevelamer carbonate  800 mg Oral TID AC  . sodium chloride  3 mL Intravenous Q12H  . tiotropium  18 mcg Inhalation Daily   acetaminophen **OR** acetaminophen, albuterol, alum & mag hydroxide-simeth, HYDROcodone-acetaminophen, nicotine, ondansetron **OR** ondansetron (ZOFRAN) IV, senna-docusate  Assessment/ Plan:  74 y.o. male with end-stage ren history of small bowel obstruction in May 2016, GERD, COPD,bladder cancer, history of TURBT  1.  ESRD. [address] dialysis.  T-T-S Dialysis today Blood transfusion with dialysis 2. Anemia of chronic kidney disease. - Procrit is on hold due to bladder cancer - Consider hematology evaluation for anemia 3. Secondary hyperparathyroidism - Monitor phosphorus this admission   LOS: 0 Chandani Rogowski 9/13/20162:24 PM

## 2015-06-06 NOTE — Progress Notes (Signed)
PRE HD   06/06/15 1500  Neurological  Level of Consciousness Alert  Orientation Level Oriented X4  Respiratory  Respiratory Pattern Regular;Labored;Dyspnea with exertion  Chest Assessment Chest expansion symmetrical  Bilateral Breath Sounds Expiratory wheezes  Cough Non-productive  Cardiac  Pulse Regular  ECG Monitor Yes  Cardiac Rhythm NSR  Ectopy Frequency Rare  Vascular  Edema Generalized  Generalized Edema +1  Integumentary  Integumentary (WDL) WDL  Musculoskeletal  Musculoskeletal (WDL) X  Generalized Weakness Yes  Gastrointestinal  Bowel Sounds Assessment Active  GU Assessment  Genitourinary (WDL) X  Genitourinary Symptoms Other (Comment) (HD)  Psychosocial  Psychosocial (WDL) WDL

## 2015-06-06 NOTE — H&P (Addendum)
[facility] [place] NAME: [name]    MR#:  [id number]  DATE OF BIRTH:  1940/11/18  DATE OF ADMISSION:  06/06/2015  PRIMARY CARE PHYSICIAN: Dion Body, MD   REQUESTING/REFERRING PHYSICIAN: Dr Corky Downs  CHIEF COMPLAINT:  Shortness of breath and presyncope HISTORY OF PRESENT ILLNESS:  [name]  is a 74 y.o. male with a known history of COPD on 2 L of oxygen at night, combined systolic EF 33-29% and diastolicCHF, ESRD on hemodialysis, prostate cancer in remission and anemia of chronic disease who presents with above complaint. Patient has been in his usual state of health. This morning he woke up and he felt dizzy and lightheaded and had a near syncopal episode. He also felt very short of breath. EMS was called and he received nebulizer treatment by EMS and he reports he felt better. He wears chronic oxygen at night 2 L. He is currently on oxygen in the emergency room. He did not go to dialysis today. He denies chest pain, lower extremity edema, fever or chills. He has had a nonproductive cough. PAST MEDICAL HISTORY:   Past Medical History  Diagnosis Date  . PAF (paroxysmal atrial fibrillation)     a. new onset 12/2014 in the setting of UTI, sepsis, hypotension, and anemia; b. not on long term anticoagulation given anemia; c. family aware of stroke risk, they are ok with this; d. on amiodarone   . Chronic combined systolic and diastolic CHF (congestive heart failure)     a. echo 12/2014: EF 25-30%, anterior wall wall motion abnormalities; b. planned ischemic evaluation once patient is stable medically; c. echo 01/2015: EF 45-50%, no RWMA, mild AT, LA mildly dilated, mod pericardial effusion along LV free wall, no evidence of hemodynamic compromise  . ESRD on hemodialysis     a. Tuesday, Thursday, and Saturdays  . Anemia     a. baseline hgb ~ 8  . History of small bowel obstruction     a. 01/2015  . History of septic shock      a. 01/2015  . GERD (gastroesophageal reflux disease)   . Allergic rhinitis   . COPD (chronic obstructive pulmonary disease)   . Hypercholesteremia   . Cognitive communication deficit   . Magnesium deficiency   . Dementia   . Iliac aneurysm   . Incontinence   . Hydronephrosis   . Overactive bladder   . Detrusor sphincter dyssynergia   . Mini stroke   . Bladder cancer   . Prostate cancer   . Renal insufficiency   . Hypertension     PAST SURGICAL HISTORY:   Past Surgical History  Procedure Laterality Date  . Cystoscopy w/ ureteral stent placement    . Transurethral resection of bladder tumor with gyrus (turbt-gyrus)    . Ureteral stent placement    . Prostate surgery      Removal  . Av fistula placement      left arm    SOCIAL HISTORY:   Social History  Substance Use Topics  . Smoking status: Former Smoker -- 0.25 packs/day for 85 years    Quit date: 05/13/2015  . Smokeless tobacco: Never Used  . Alcohol Use: No    FAMILY HISTORY:   Family History  Problem Relation Age of Onset  . Stroke Mother     DRUG ALLERGIES:   Allergies  Allergen Reactions  . Penicillins Hives, Itching and Swelling     REVIEW OF SYSTEMS:  CONSTITUTIONAL: No fever, ++fatigue/ weakness.  EYES: No blurred or double vision.  EARS, NOSE, AND THROAT: No tinnitus or ear pain.  RESPIRATORY:++ nonproductive cough, ++shortness of breath, NO wheezing or hemoptysis.  CARDIOVASCULAR: No chest pain, orthopnea, edema.  GASTROINTESTINAL: No nausea, vomiting, diarrhea or abdominal pain.  GENITOURINARY: No dysuria, hematuria.  ENDOCRINE: No polyuria, nocturia,  HEMATOLOGY: ++ anemia, NO easy bruising or bleeding SKIN: No rash or lesion. MUSCULOSKELETAL.  Wheelchair bound, hip pain chronic NEUROLOGIC: No tingling, numbness, .  PSYCHIATRY: No anxiety or depression.   MEDICATIONS AT HOME:   Prior to Admission medications   Medication Sig Start Date End Date Taking? Authorizing Provider   albuterol (PROVENTIL HFA;VENTOLIN HFA) 108 (90 BASE) MCG/ACT inhaler Inhale 2 puffs into the lungs every 4 (four) hours as needed for wheezing or shortness of breath.   Yes Historical Provider, MD  amiodarone (PACERONE) 200 MG tablet Take 1 tablet (200 mg total) by mouth daily. 02/27/15  Yes Wellington Hampshire, MD  amLODipine (NORVASC) 2.5 MG tablet Take 2.5 mg by mouth daily.   Yes Historical Provider, MD  atorvastatin (LIPITOR) 40 MG tablet Take 1 tablet (40 mg total) by mouth daily. 02/03/15  Yes Fritzi Mandes, MD  budesonide-formoterol (SYMBICORT) 160-4.5 MCG/ACT inhaler Inhale 2 puffs into the lungs 2 (two) times daily.   Yes Historical Provider, MD  ferrous sulfate 325 (65 FE) MG tablet Take 1 tablet (325 mg total) by mouth daily with breakfast. 03/27/15  Yes Srikar Sudini, MD  guaiFENesin (MUCINEX) 600 MG 12 hr tablet Take 1 tablet (600 mg total) by mouth 2 (two) times daily. 05/08/15  Yes Theodoro Grist, MD  magnesium oxide (MAG-OX) 400 MG tablet Take 400 mg by mouth every evening.    Yes Historical Provider, MD  pantoprazole (PROTONIX) 40 MG tablet Take 1 tablet (40 mg total) by mouth 2 (two) times daily. 05/08/15  Yes Theodoro Grist, MD  sevelamer carbonate (RENVELA) 800 MG tablet Take 800 mg by mouth 3 (three) times daily before meals.    Yes Historical Provider, MD  tiotropium (SPIRIVA) 18 MCG inhalation capsule Place 1 capsule (18 mcg total) into inhaler and inhale daily. 02/17/15  Yes Dustin Flock, MD  chlorpheniramine-HYDROcodone (TUSSIONEX) 10-8 MG/5ML SUER Take 5 mLs by mouth every 12 (twelve) hours. 05/08/15   Theodoro Grist, MD  levofloxacin (LEVAQUIN) 500 MG tablet Take 1 tablet by mouth every other day. 05/30/15   Historical Provider, MD  nicotine (NICOTROL) 10 MG inhaler Inhale 1 cartridge (1 continuous puffing total) into the lungs as needed for smoking cessation. 05/08/15   Theodoro Grist, MD  Nutritional Supplements (FEEDING SUPPLEMENT, NEPRO CARB STEADY,) LIQD Take 237 mLs by mouth 2 (two)  times daily between meals. Patient not taking: Reported on 05/04/2015 02/03/15   Fritzi Mandes, MD  polyethylene glycol [facility] / GLYCOLAX) packet Take 17 g by mouth daily. Patient not taking: Reported on 04/10/2015 03/27/15   Hillary Bow, MD      VITAL SIGNS:  Blood pressure 150/79, pulse 79, temperature 96.9 F (36.1 C), temperature source Axillary, resp. rate 26, height 6\' 2"  (1.88 m), weight 92.08 kg (203 lb), SpO2 100 %.  PHYSICAL EXAMINATION:  GENERAL:  74 y.o.-year-old patient lying in the bed with no acute distress.  EYES: Pupils equal, round, reactive to light and accommodation. No scleral icterus. Extraocular muscles intact.  HEENT: Head atraumatic, normocephalic. Oropharynx  clear.  NECK:  Supple, no jugular venous distention. No thyroid enlargement, no tenderness.  LUNGS: decreased breath sounds right base,  no wheezing, rales,rhonchi or crepitation. No use of accessory muscles of respiration.  CARDIOVASCULAR: S1, S2 normal. No murmurs, rubs, or gallops.  ABDOMEN: Soft, nontender, nondistended. Bowel sounds present. No organomegaly or mass.  EXTREMITIES: No pedal edema, cyanosis, or clubbing.  NEUROLOGIC: Cranial nerves II through XII are grossly intact. No focal deficits. PSYCHIATRIC: The patient is alert and oriented x 3.  SKIN: No obvious rash, lesion, or ulcer.   LABORATORY PANEL:   CBC  Recent Labs Lab 06/06/15 0727  WBC 13.3*  HGB 6.4*  HCT 20.5*  PLT 287   ------------------------------------------------------------------------------------------------------------------  Chemistries   Recent Labs Lab 06/06/15 0727  NA 142  K 4.1  CL 100*  CO2 29  GLUCOSE 125*  BUN 40*  CREATININE 4.59*  CALCIUM 8.5*  AST 21  ALT 8*  ALKPHOS 82  BILITOT 1.0   ------------------------------------------------------------------------------------------------------------------  Cardiac Enzymes  Recent Labs Lab 06/06/15 0727  TROPONINI 0.03    ------------------------------------------------------------------------------------------------------------------  RADIOLOGY:  Ct Chest Wo Contrast  06/06/2015   CLINICAL DATA:  Shortness of breath today. History of hemodialysis, prostate and bladder cancer. Initial encounter.  EXAM: CT CHEST WITHOUT CONTRAST  TECHNIQUE: Multidetector CT imaging of the chest was performed following the standard protocol without IV contrast.  COMPARISON:  Radiographs 06/06/2015 and 03/20/2015. CT 01/26/2015 and 09/04/2009. Abdominal CTA 03/21/2015.  FINDINGS: Mediastinum/Nodes: There are no enlarged mediastinal, hilar or axillary lymph nodes.Hilar assessment remains limited by the lack of intravenous contrast, although the hilar contours are unchanged. The thyroid gland, trachea and esophagus demonstrate no significant findings. The heart size is normal. There is no significant residual pericardial effusion. There is diffuse atherosclerosis of the aorta, great vessels and coronary arteries. Dilatation of the distal arch to 5.2 cm is similar to the prior study.  Lungs/Pleura: There are dependent right-greater-than-left pleural effusions with interval enlargement of the right pleural effusion. This effusion may be partially loculated. There are dependent opacities in both lungs consistent with atelectasis. Mild upper lobe paraseptal emphysematous changes are noted.  Upper abdomen: Bilateral adrenal nodularity appears unchanged from 2010, most consistent with adenomas. There is collecting system dilatation within the left kidney upper pole, unchanged from 4 months ago but new from 2010, as seen on CTA 3 months ago.  Musculoskeletal/Chest wall: Bilateral gynecomastia again noted. Scattered sclerotic lesions within the spine are grossly stable, largest at T12. No acute osseous findings seen.  IMPRESSION: 1. Interval enlargement of right pleural effusion, potentially partially loculated. Smaller left pleural effusion appears  unchanged. 2. Interval resolution of pericardial effusion. 3. Mildly increased in bibasilar atelectasis. 4. Diffuse atherosclerosis with grossly stable dilatation of the distal aortic arch. 5. Stable bilateral adrenal nodules.   Electronically Signed   By: Richardean Sale M.D.   On: 06/06/2015 09:50   Dg Chest Portable 1 View  06/06/2015   CLINICAL DATA:  Shortness of breath today, the patient does undergo dialysis, some wheezing  EXAM: PORTABLE CHEST - 1 VIEW  COMPARISON:  Portable chest x-ray of 03/20/2015  FINDINGS: Aeration of the lung bases has improved somewhat. There are still hazy opacities at both lung bases consistent with atelectasis and effusions with pneumonia difficult to exclude. Also, there may be mild pulmonary vascular congestion present. A two-view chest x-ray would be helpful if possible. Cardiomegaly is stable.  IMPRESSION: 1. Slightly better aeration. 2. Bibasilar opacities consistent with atelectasis and effusions. Cannot exclude pneumonia. 3. Question mild pulmonary vascular congestion.   Electronically Signed   By: Ivar Drape M.D.   On:  06/06/2015 08:08    EKG:  Normal sinus rhythm no salivation depression  IMPRESSION AND PLAN:  Severe pleasant 74 year old male with end-stage renal disease on hemodialysis, anemia of chronic disease and COPD with 2 L of oxygen at night presents with shortness of breath and found to have anemia and possible COPD exacerbation.  1. Symptomatic anemia: Patient was admitted about a month ago with similar issue. He received 1 unit of PRBCs at that time. He was found to be guaiac negative. He had a colonoscopy in 2012 which did not show evidence of malignancy. Patient will again be admitted and has consented for 1 unit of PRBC. I will order stool guaiac test. He was seen and evaluated by GI during last auscultation. If his guaiac is positive or he develops melanoma or hematochezia then we should consult GI. He was seen by oncology yesterday for anemia  of chronic disease. His anemia is likely secondary to anemia of chronic disease from his end-stage renal disease and history of prostate cancer. He had iron studies in August which showed a low iron level. Hemoglobin will be repeated in the a.m. His baseline hemoglobin is 7.5 Patient will require Procrit/Epogen and continue ferrous sulfate.   2. Presyncope: I suspect this is from his symptomatic anemia and possibly his shortness of breath. I will check orthostatic vital signs. We will transfuse 1 unit of PRBC. Telemetry monitoring will also be continued.  3. Right pleural effusion: This is potentially partially loculated. I will ask for pulmonary consultation to evaluate this. I spoken with the patient about possible thoracentesis. The power of attorney would like the pulmonologist to contact her if this procedure will be ordered. I will also empirically start AZTREONAM for possible healthcare acquired pneumonia.  4.. End-stage renal disease on hemodialysis: Patient will require dialysis and I have consulted nephrology. He will have his blood transfusion during dialysis.  5. COPD: Patient had wheezing upon arrival in the emergency room. Patient has no wheezing at this time. I will continue DuoNeb's and start IV steroids for mild COPD exacerbation.  6. Hyperlipidemia: Continue Lipitor  7. History of PAF: Continue amiodarone.  8. Essential hypertension: Continue Norvasc.       All the records are reviewed and case discussed with ED provider. Management plans discussed with the patient and he is in agreement.  CODE STATUS: FULL  TOTAL TIME TAKING CARE OF THIS PATIENT: 50 minutes.    Aquanetta Schwarz M.D on 06/06/2015 at 12:00 PM  Between 7am to 6pm - Pager - [phone] After 6pm go to [email or web address] - password [email or web address] [place] [facility]  Office  [phone]  CC: Primary care physician; Dion Body, MD

## 2015-06-06 NOTE — Progress Notes (Signed)
ANTIBIOTIC CONSULT NOTE - INITIAL  Pharmacy Consult for aztreonam  Indication: pneumonia  Allergies  Allergen Reactions  . Penicillins Hives, Itching and Swelling    Patient Measurements: Height: 6\' 2"  (188 cm) Weight: 202 lb 6.1 oz (91.8 kg) IBW/kg (Calculated) : 82.2  Vital Signs: Temp: 97.6 F (36.4 C) (09/13 1815) Temp Source: Oral (09/13 1815) BP: 149/74 mmHg (09/13 1815) Pulse Rate: 78 (09/13 1815) Intake/Output from previous day:   Intake/Output from this shift: Total I/O In: -  Out: 1502 [Other:1502]  Labs:  Recent Labs  06/06/15 0727  WBC 13.3*  HGB 6.4*  PLT 287  CREATININE 4.59*   Estimated Creatinine Clearance: 16.7 mL/min (by C-G formula based on Cr of 4.59). No results for input(s): VANCOTROUGH, VANCOPEAK, VANCORANDOM, GENTTROUGH, GENTPEAK, GENTRANDOM, TOBRATROUGH, TOBRAPEAK, TOBRARND, AMIKACINPEAK, AMIKACINTROU, AMIKACIN in the last 72 hours.   Microbiology: Recent Results (from the past 720 hour(s))  C difficile quick scan w PCR reflex     Status: Abnormal   Collection Time: 05/08/15  2:49 PM  Result Value Ref Range Status   C Diff antigen POSITIVE (A) NEGATIVE Final   C Diff toxin NEGATIVE NEGATIVE Final   C Diff interpretation   Final    Negative for toxigenic C. difficile. Toxin gene and active toxin production not detected. May be a nontoxigenic strain of C. difficile bacteria present, lacking the ability to produce toxin.  Clostridium Difficile by PCR     Status: None   Collection Time: 05/08/15  2:49 PM  Result Value Ref Range Status   Toxigenic C Difficile by pcr NEGATIVE NEGATIVE Final    Medical History: Past Medical History  Diagnosis Date  . PAF (paroxysmal atrial fibrillation)     a. new onset 12/2014 in the setting of UTI, sepsis, hypotension, and anemia; b. not on long term anticoagulation given anemia; c. family aware of stroke risk, they are ok with this; d. on amiodarone   . Chronic combined systolic and diastolic CHF  (congestive heart failure)     a. echo 12/2014: EF 25-30%, anterior wall wall motion abnormalities; b. planned ischemic evaluation once patient is stable medically; c. echo 01/2015: EF 45-50%, no RWMA, mild AT, LA mildly dilated, mod pericardial effusion along LV free wall, no evidence of hemodynamic compromise  . ESRD on hemodialysis     a. Tuesday, Thursday, and Saturdays  . Anemia     a. baseline hgb ~ 8  . History of small bowel obstruction     a. 01/2015  . History of septic shock     a. 01/2015  . GERD (gastroesophageal reflux disease)   . Allergic rhinitis   . COPD (chronic obstructive pulmonary disease)   . Hypercholesteremia   . Cognitive communication deficit   . Magnesium deficiency   . Dementia   . Iliac aneurysm   . Incontinence   . Hydronephrosis   . Overactive bladder   . Detrusor sphincter dyssynergia   . Mini stroke   . Bladder cancer   . Prostate cancer   . Renal insufficiency   . Hypertension     Medications:  Anti-infectives    Start     Dose/Rate Route Frequency Ordered Stop   06/07/15 0400  aztreonam (AZACTAM) 500 mg in dextrose 5 % 50 mL IVPB     500 mg 100 mL/hr over 30 Minutes Intravenous 3 times per day 06/06/15 1842     06/06/15 1930  aztreonam (AZACTAM) 2 g in dextrose 5 % 50 mL IVPB  2 g 100 mL/hr over 30 Minutes Intravenous  Once 06/06/15 1425       Assessment: Pharmacy consulted to dose aztreonam for pneumonia  Plan:  Patient has reported allergy to PCN and is on intermittent HD. Patient received HD today. Based on renal function, gave aztreonam 2g IV loading dose following by 500 mg IV q8h. Pharmacy will continue to monitor.   Darylene Price Zonya Gudger 06/06/2015,6:43 PM

## 2015-06-06 NOTE — Progress Notes (Signed)
PRE HD   06/06/15 1500  Report  Report Received From [place], RN  Vital Signs  Temp 97.8 F (36.6 C)  Temp Source Oral  Pulse Rate 78  Pulse Rate Source Monitor  Resp (!) 26  BP 124/79 mmHg  BP Location Right Arm  BP Method Automatic  Patient Position (if appropriate) Lying  Oxygen Therapy  SpO2 95 %  O2 Device Nasal Cannula  O2 Flow Rate (L/min) 2 L/min  Pain Assessment  Pain Assessment No/denies pain  Pain Score Asleep  Dialysis Weight  Weight 93.4 kg (205 lb 14.6 oz)  Type of Weight Pre-Dialysis  Time-Out for Hemodialysis  What Procedure? HEMODIALYSIS  Pt Identifiers(min of two) First/Last Name;MRN/Account#  Correct Site? Yes  Correct Side? Yes  Correct Procedure? Yes  Consents Verified? Yes  Rad Studies Available? N/A  Safety Precautions Reviewed? Yes  Machine [employer] Number [id number]  Station Number 4  UF/Alarm Test Passed  Conductivity: Meter 13.9  Conductivity: Machine  14  pH 7.4  Reverse Osmosis MAIN  Dialyzer Lot Number [id number]  Disposable Set Lot Number [id number]  Machine Temperature 98.6 F (37 C)  Musician and Audible Yes  Blood Lines Intact and Secured Yes  Pre Treatment Patient Checks  Vascular access used during treatment Fistula  Hemodialysis Consent Verified Yes  Hemodialysis Standing Orders Initiated Yes  ECG (Telemetry) Monitor On Yes  Prime Ordered Normal Saline  Length of  DialysisTreatment -hour(s) 3.5 Hour(s)  Dialysis Treatment Comments GOAL TO REMOVE 2L OVER 3.5 HOURS. 15G NEEDLES INSERTED WITHOUT DIFFICULTY. PT ALERT AND VS STABLE.   Dialyzer Optiflux 180 NR  Dialysate 2 Ca (3K)  Dialysis Anticoagulant None  Dialysate Flow Ordered 800  Blood Flow Rate Ordered 400 mL/min  Ultrafiltration Goal 2 Liters  Pre Treatment Labs Renal panel;Hepatitis B Surface Antigen;CBC;Type & Screen  Blood Products Ordered Packed Red Blood Cells  Dialysis Blood Pressure Support Ordered Normal Saline  Fistula / Graft Left Upper arm  Arteriovenous fistula  No Placement Date or Time found.   Placed prior to admission: Yes  Orientation: Left  Access Location: Upper arm  Access Type: Arteriovenous fistula  Site Condition No complications  Fistula / Graft Assessment Present;Thrill;Bruit  Status Accessed  Needle Size 15  Drainage Description None

## 2015-06-06 NOTE — Progress Notes (Signed)
Called Dr Benjie Karvonen on Troponin of 0.05 she acknowledged no new order given

## 2015-06-06 NOTE — Progress Notes (Signed)
Patient arrived on unity near shift end, VSS, no c/o pain, on tele NSR  75

## 2015-06-06 NOTE — ED Provider Notes (Signed)
[facility] Emergency Department Provider Note  ____________________________________________  Time seen: 7 AM  I have reviewed the triage vital signs and the nursing notes.   HISTORY  Chief Complaint Shortness of Breath    HPI [name] is a 74 y.o. male who presents with shortness of breath and dizziness. Patient reports he was getting ready to go to dialysis this morning (Tuesday Thursday Saturday) and he developed shortness of breath and felt like he had to have a bowel movement and then became lightheaded. He has a history of COPD. He received DuoNeb's via [facility] feels much better now. Patient wears oxygen at home. He reports his breathing is at baseline currently. No chest pain. No dizziness at this time. He has not missed any dialysis     Past Medical History  Diagnosis Date  . PAF (paroxysmal atrial fibrillation)     a. new onset 12/2014 in the setting of UTI, sepsis, hypotension, and anemia; b. not on long term anticoagulation given anemia; c. family aware of stroke risk, they are ok with this; d. on amiodarone   . Chronic combined systolic and diastolic CHF (congestive heart failure)     a. echo 12/2014: EF 25-30%, anterior wall wall motion abnormalities; b. planned ischemic evaluation once patient is stable medically; c. echo 01/2015: EF 45-50%, no RWMA, mild AT, LA mildly dilated, mod pericardial effusion along LV free wall, no evidence of hemodynamic compromise  . ESRD on hemodialysis     a. Tuesday, Thursday, and Saturdays  . Anemia     a. baseline hgb ~ 8  . History of small bowel obstruction     a. 01/2015  . History of septic shock     a. 01/2015  . GERD (gastroesophageal reflux disease)   . Allergic rhinitis   . COPD (chronic obstructive pulmonary disease)   . Hypercholesteremia   . Cognitive communication deficit   . Magnesium deficiency   . Dementia   . Iliac aneurysm   . Incontinence   . Hydronephrosis   . Overactive bladder    . Detrusor sphincter dyssynergia   . Mini stroke   . Bladder cancer   . Prostate cancer   . Renal insufficiency   . Hypertension     Patient Active Problem List   Diagnosis Date Noted  . Generalized weakness 05/08/2015  . ESRD on dialysis 05/08/2015  . Other emphysema 05/08/2015  . Tobacco use disorder 05/08/2015  . Hypokalemia 05/08/2015  . Pressure ulcer 05/05/2015  . Symptomatic anemia 05/04/2015  . CAFL (chronic airflow limitation) 04/10/2015  . Malnutrition of moderate degree 03/22/2015  . Abdominal pain   . Pneumonia 03/20/2015  . History of sepsis 02/27/2015  . Incontinence 02/27/2015  . Persistent atrial fibrillation 02/27/2015  . Preoperative cardiovascular examination 02/27/2015  . GI bleed 02/13/2015  . Ileus   . Chronic combined systolic and diastolic CHF (congestive heart failure)   . ESRD on hemodialysis   . Anemia   . History of small bowel obstruction   . Atrial fibrillation with RVR 01/21/2015  . Chronic kidney disease (CKD), stage IV (severe) 03/01/2014  . Essential (primary) hypertension 03/01/2014  . BP (high blood pressure) 03/01/2014  . 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 Absence of bladder continence 03/01/2014  . Chronic kidney disease requiring chronic dialysis 03/01/2014  . Bladder compliance low 11/11/2012  . Cystitis 11/11/2012  . Acute cystitis 10/15/2012  . AAA (abdominal aortic aneurysm) 08/27/2012  . Aneurysm artery, iliac 08/27/2012  . Chronic  kidney disease (CKD), stage V 08/27/2012  . Hydronephrosis 08/27/2012  . Incomplete bladder emptying 08/27/2012  . Bladder cancer 08/27/2012  . CA of prostate 08/27/2012  . Bone metastases 08/27/2012  . Infection of urinary tract 08/27/2012    Past Surgical History  Procedure Laterality Date  . Cystoscopy w/ ureteral stent placement    . Transurethral resection of bladder tumor with gyrus (turbt-gyrus)    . Ureteral stent placement    . Prostate surgery      Removal  . Av fistula  placement      left arm    Current Outpatient Rx  Name  Route  Sig  Dispense  Refill  . albuterol (PROVENTIL HFA;VENTOLIN HFA) 108 (90 BASE) MCG/ACT inhaler   Inhalation   Inhale 2 puffs into the lungs every 4 (four) hours as needed for wheezing or shortness of breath.         Marland Kitchen amiodarone (PACERONE) 200 MG tablet   Oral   Take 1 tablet (200 mg total) by mouth daily.   30 tablet   5   . amLODipine (NORVASC) 2.5 MG tablet   Oral   Take 2.5 mg by mouth daily.         Marland Kitchen atorvastatin (LIPITOR) 40 MG tablet   Oral   Take 1 tablet (40 mg total) by mouth daily.   30 tablet   1   . budesonide-formoterol (SYMBICORT) 160-4.5 MCG/ACT inhaler   Inhalation   Inhale 2 puffs into the lungs 2 (two) times daily.         . chlorpheniramine-HYDROcodone (TUSSIONEX) 10-8 MG/5ML SUER   Oral   Take 5 mLs by mouth every 12 (twelve) hours.   140 mL   0   . ferrous sulfate 325 (65 FE) MG tablet   Oral   Take 1 tablet (325 mg total) by mouth daily with breakfast.   90 tablet   0   . guaiFENesin (MUCINEX) 600 MG 12 hr tablet   Oral   Take 1 tablet (600 mg total) by mouth 2 (two) times daily.   20 tablet   2   . magnesium oxide (MAG-OX) 400 MG tablet   Oral   Take 400 mg by mouth every evening.          . nicotine (NICOTROL) 10 MG inhaler   Inhalation   Inhale 1 cartridge (1 continuous puffing total) into the lungs as needed for smoking cessation.   42 each   0   . Nutritional Supplements (FEEDING SUPPLEMENT, NEPRO CARB STEADY,) LIQD   Oral   Take 237 mLs by mouth 2 (two) times daily between meals. Patient not taking: Reported on 05/04/2015   30 Can   0   . pantoprazole (PROTONIX) 40 MG tablet   Oral   Take 1 tablet (40 mg total) by mouth 2 (two) times daily.   30 tablet   0   . polyethylene glycol (MIRALAX / GLYCOLAX) packet   Oral   Take 17 g by mouth daily. Patient not taking: Reported on 04/10/2015   14 each   0   . sevelamer carbonate (RENVELA) 800 MG  tablet   Oral   Take 800 mg by mouth 3 (three) times daily before meals.          . tiotropium (SPIRIVA) 18 MCG inhalation capsule   Inhalation   Place 1 capsule (18 mcg total) into inhaler and inhale daily.   30 capsule   12  Allergies Penicillins  Family History  Problem Relation Age of Onset  . Stroke Mother     Social History Social History  Substance Use Topics  . Smoking status: Former Smoker -- 0.25 packs/day for 46 years    Quit date: 05/13/2015  . Smokeless tobacco: Never Used  . Alcohol Use: No    Review of Systems  Constitutional: Negative for fever. Eyes: Negative for visual changes. ENT: Negative for sore throat Cardiovascular: Negative for chest pain. Respiratory: Positive for shortness of breath Gastrointestinal: Negative for abdominal pain, vomiting. Positive for diarrhea Genitourinary: Negative for dysuria. Musculoskeletal: Negative for back pain. Skin: Negative for rash. Neurological: Negative for headaches or focal weakness Psychiatric: No anxiety    ____________________________________________   PHYSICAL EXAM:  VITAL SIGNS: ED Triage Vitals  Enc Vitals Group     BP --      Pulse Rate 06/06/15 0648 81     Resp 06/06/15 0648 22     Temp 06/06/15 0648 96.9 F (36.1 C)     Temp Source 06/06/15 0648 Axillary     SpO2 06/06/15 0648 98 %     Weight 06/06/15 0648 203 lb (92.08 kg)     Height 06/06/15 0648 6\' 2"  (1.88 m)     Head Cir --      Peak Flow --      Pain Score 06/06/15 0651 0     Pain Loc --      Pain Edu? --      Excl. in [place]? --     Constitutional: Alert and oriented. No acute distress Eyes: Conjunctivae are normal.  ENT   Head: Normocephalic and atraumatic.   Mouth/Throat: Mucous membranes are moist. Cardiovascular: Normal rate, regular rhythm. Normal and symmetric distal pulses are present in all extremities. Dialysis access left arm positive thrill, no redness or tenderness to palpation Respiratory: Normal  respiratory effort without tachypnea nor retractions. Significant wheezes bibasilarly Gastrointestinal: Soft and non-tender in all quadrants. No distention. There is no CVA tenderness. Genitourinary: deferred Musculoskeletal: Nontender with normal range of motion in all extremities. No lower extremity tenderness nor edema. Neurologic:  Normal speech and language. No gross focal neurologic deficits are appreciated. Patient is chronic lower extremity weakness and is unable to walk on his own. This is not new Skin:  Skin is warm, dry and intact. No rash noted. Psychiatric: Mood and affect are normal. Patient exhibits appropriate insight and judgment.  ____________________________________________    LABS (pertinent positives/negatives)  Labs Reviewed  CBC  COMPREHENSIVE METABOLIC PANEL  TROPONIN I    ____________________________________________   EKG  ED ECG REPORT I, Lavonia Drafts, the attending physician, personally viewed and interpreted this ECG.   Date: 06/06/2015  EKG Time: 6:25 AM  Rate: 83  Rhythm: normal sinus rhythm  Axis: Normal  Intervals:none  ST&T Change: Nonspecific ST changes   ____________________________________________    RADIOLOGY I have personally reviewed any xrays that were ordered on this patient: Chest x-ray shows bibasilar atelectasis  CT scan shows possible partially loculated pleural effusion  ____________________________________________   PROCEDURES  Procedure(s) performed: none  Critical Care performed: none  ____________________________________________   INITIAL IMPRESSION / ASSESSMENT AND PLAN / ED COURSE  Pertinent labs & imaging results that were available during my care of the patient were reviewed by me and considered in my medical decision making (see chart for details).  Patient currently appears to be at his baseline. He does have significant wheezing bibasilarly we will continue treatment for COPD at  this time as we  check cardiac enzymes and lab work and chest x-ray. I will reevaluate   CT scan shows partially loculated effusion with mildly elevated white blood cell count. This chronic effusion but does appear to be slightly worse. I will discuss with the hospitalist to consider him for admission ____________________________________________   FINAL CLINICAL IMPRESSION(S) / ED DIAGNOSES  Final diagnoses:  Pleural effusion  Symptomatic anemia     Lavonia Drafts, MD 06/06/15 1205

## 2015-06-06 NOTE — Progress Notes (Signed)
POST HD   06/06/15 1815  Report  Report Received From CHRIS, RN  Vital Signs  Temp 97.6 F (36.4 C)  Temp Source Oral  Pulse Rate 78  Pulse Rate Source Monitor  Resp (!) 29  BP (!) 149/74 mmHg  BP Location Right Arm  BP Method Automatic  Patient Position (if appropriate) Lying  Dialysis Weight  Weight 91.8 kg (202 lb 6.1 oz)  Type of Weight Post-Dialysis  During Hemodialysis Assessment  Intra-Hemodialysis Comments 2002= hd end early due to clotting dialyzer . MD NOTIFIED. PT ALERT AND ORIENTED STATES HE NEEDS TO HAVE A BM.  BLOOD RETURNED AND 15G NEEDLES REMOVED PER POLICY.    Post-Hemodialysis Assessment  Rinseback Volume (mL) 250 mL  Dialyzer Clearance Clotted  Duration of HD Treatment -hour(s) 3 hour(s)  Hemodialysis Intake (mL) 500 mL  UF Total -Machine (mL) 2002 mL  Net UF (mL) 1502 mL  Tolerated HD Treatment Yes  Post-Hemodialysis Comments 2002= hd end early due to clotting dialyzer . MD NOTIFIED. PT ALERT AND ORIENTED STATES HE NEEDS TO HAVE A BM.  BLOOD RETURNED AND 15G NEEDLES REMOVED PER POLICY.    AVG/AVF Arterial Site Held (minutes) 7 minutes  AVG/AVF Venous Site Held (minutes) 7 minutes  Education / Care Plan  Hemodialysis Education Provided Yes  Documented Education in Clinical Pathway Yes  Fistula / Graft Left Upper arm Arteriovenous fistula  No Placement Date or Time found.   Placed prior to admission: Yes  Orientation: Left  Access Location: Upper arm  Access Type: Arteriovenous fistula  Site Condition No complications  Fistula / Graft Assessment Present;Thrill;Bruit  Status Deaccessed  Needle Size 15  Drainage Description None

## 2015-06-06 NOTE — Progress Notes (Signed)
POST HD   06/06/15 1815  Neurological  Level of Consciousness Alert  Orientation Level Oriented X4  Respiratory  Respiratory Pattern Regular;Labored;Dyspnea with exertion  Chest Assessment Chest expansion symmetrical  Bilateral Breath Sounds Expiratory wheezes  Cough Non-productive  Cardiac  Pulse Regular  ECG Monitor Yes  Cardiac Rhythm NSR  Ectopy Frequency Rare  Vascular  Edema Generalized  Generalized Edema +1  Integumentary  Integumentary (WDL) WDL  Musculoskeletal  Musculoskeletal (WDL) X  Generalized Weakness Yes  Gastrointestinal  Bowel Sounds Assessment Active  GU Assessment  Genitourinary (WDL) X  Genitourinary Symptoms Other (Comment) (HD)  Psychosocial  Psychosocial (WDL) WDL

## 2015-06-06 NOTE — Progress Notes (Signed)
Patient is active with [facility]. Will need resumption orders at discharge if home health is requested level of care at discharge.

## 2015-06-07 ENCOUNTER — Inpatient Hospital Stay: Payer: Medicare Other

## 2015-06-07 LAB — TROPONIN I
TROPONIN I: 0.04 ng/mL — AB (ref <0.031)
TROPONIN I: 0.03 ng/mL (ref <0.031)

## 2015-06-07 LAB — HEMOGLOBIN AND HEMATOCRIT, BLOOD
HEMATOCRIT: 24.2 % — AB (ref 40.0–52.0)
Hemoglobin: 7.9 g/dL — ABNORMAL LOW (ref 13.0–18.0)

## 2015-06-07 LAB — HEPATITIS B SURFACE ANTIGEN: HEP B S AG: NEGATIVE

## 2015-06-07 LAB — HEPATITIS B SURFACE ANTIBODY, QUANTITATIVE

## 2015-06-07 LAB — MRSA PCR SCREENING: MRSA by PCR: NEGATIVE

## 2015-06-07 MED ORDER — BISMUTH SUBSALICYLATE 262 MG/15ML PO SUSP
30.0000 mL | ORAL | Status: DC | PRN
  Filled 2015-06-07: qty 118

## 2015-06-07 MED ORDER — IPRATROPIUM-ALBUTEROL 0.5-2.5 (3) MG/3ML IN SOLN
3.0000 mL | RESPIRATORY_TRACT | Status: DC | PRN
  Administered 2015-06-08: 3 mL via RESPIRATORY_TRACT
  Filled 2015-06-07: qty 3

## 2015-06-07 MED ORDER — LEVOFLOXACIN IN D5W 750 MG/150ML IV SOLN
750.0000 mg | Freq: Once | INTRAVENOUS | Status: AC
  Administered 2015-06-07: 15:00:00 750 mg via INTRAVENOUS
  Filled 2015-06-07: qty 150

## 2015-06-07 MED ORDER — LEVOFLOXACIN IN D5W 500 MG/100ML IV SOLN: 500.0000 mg | INTRAVENOUS | Status: DC

## 2015-06-07 NOTE — Progress Notes (Signed)
Subjective:  Patient presented to the emergency room after having an episode of explosive diarrhea this morning. He then started to have shortness of breath.  He decided to come to the ER.  He missed his dialysis this morning. He was found to have severe anemia with hemoglobin down to 6.4. He received blood transfusion.   Completed his dialysis yesterday    Objective:  Vital signs in last 24 hours:  Temp:  [97.6 F (36.4 C)-98.5 F (36.9 C)] 98.2 F (36.8 C) (09/14 0531) Pulse Rate:  [34-87] 84 (09/14 0531) Resp:  [18-34] 20 (09/14 0531) BP: (124-150)/(62-94) 146/82 mmHg (09/14 0531) SpO2:  [92 %-100 %] 98 % (09/14 0818) FiO2 (%):  [28 %] 28 % (09/13 1830) Weight:  [91.8 kg (202 lb 6.1 oz)-93.4 kg (205 lb 14.6 oz)] 91.8 kg (202 lb 6.1 oz) (09/13 1815)  Weight change: 1.32 kg (2 lb 14.6 oz) Filed Weights   06/06/15 0648 06/06/15 1500 06/06/15 1815  Weight: 92.08 kg (203 lb) 93.4 kg (205 lb 14.6 oz) 91.8 kg (202 lb 6.1 oz)    Intake/Output:    Intake/Output Summary (Last 24 hours) at 06/07/15 1016 Last data filed at 06/07/15 0145  Gross per 24 hour  Intake    395 ml  Output   1502 ml  Net  -1107 ml     Physical Exam: General: no distress, laying in the bed  HEENT anicteric, moist mucous membranes  Neck supple,  Pulm/lungs Mild diffuse basilar crackles, normal effort  CVS/Heart Regular rhythm, no rub or gallop  Abdomen:  Soft, mildly distended, nontender  Extremities: Trace to 1+ pitting edema  Neurologic: Alert, able to follow commands  Skin: Chronic changes over the legs  Access: Left arm AV fistula       Basic Metabolic Panel:   Recent Labs Lab 06/06/15 0727  NA 142  K 4.1  CL 100*  CO2 29  GLUCOSE 125*  BUN 40*  CREATININE 4.59*  CALCIUM 8.5*     CBC:  Recent Labs Lab 06/06/15 0727  WBC 13.3*  HGB 6.4*  HCT 20.5*  MCV 92.4  PLT 287      Microbiology:  Recent Results (from the past 720 hour(s))  C difficile quick scan w PCR reflex      Status: Abnormal   Collection Time: 05/08/15  2:49 PM  Result Value Ref Range Status   C Diff antigen POSITIVE (A) NEGATIVE Final   C Diff toxin NEGATIVE NEGATIVE Final   C Diff interpretation   Final    Negative for toxigenic C. difficile. Toxin gene and active toxin production not detected. May be a nontoxigenic strain of C. difficile bacteria present, lacking the ability to produce toxin.  Clostridium Difficile by PCR     Status: None   Collection Time: 05/08/15  2:49 PM  Result Value Ref Range Status   Toxigenic C Difficile by pcr NEGATIVE NEGATIVE Final    Coagulation Studies: No results for input(s): LABPROT, INR in the last 72 hours.  Urinalysis: No results for input(s): COLORURINE, LABSPEC, PHURINE, GLUCOSEU, HGBUR, BILIRUBINUR, KETONESUR, PROTEINUR, UROBILINOGEN, NITRITE, LEUKOCYTESUR in the last 72 hours.  Invalid input(s): APPERANCEUR    Imaging: Ct Chest Wo Contrast  06/06/2015   CLINICAL DATA:  Shortness of breath today. History of hemodialysis, prostate and bladder cancer. Initial encounter.  EXAM: CT CHEST WITHOUT CONTRAST  TECHNIQUE: Multidetector CT imaging of the chest was performed following the standard protocol without IV contrast.  COMPARISON:  Radiographs 06/06/2015 and  03/20/2015. CT 01/26/2015 and 09/04/2009. Abdominal CTA 03/21/2015.  FINDINGS: Mediastinum/Nodes: There are no enlarged mediastinal, hilar or axillary lymph nodes.Hilar assessment remains limited by the lack of intravenous contrast, although the hilar contours are unchanged. The thyroid gland, trachea and esophagus demonstrate no significant findings. The heart size is normal. There is no significant residual pericardial effusion. There is diffuse atherosclerosis of the aorta, great vessels and coronary arteries. Dilatation of the distal arch to 5.2 cm is similar to the prior study.  Lungs/Pleura: There are dependent right-greater-than-left pleural effusions with interval enlargement of the right  pleural effusion. This effusion may be partially loculated. There are dependent opacities in both lungs consistent with atelectasis. Mild upper lobe paraseptal emphysematous changes are noted.  Upper abdomen: Bilateral adrenal nodularity appears unchanged from 2010, most consistent with adenomas. There is collecting system dilatation within the left kidney upper pole, unchanged from 4 months ago but new from 2010, as seen on CTA 3 months ago.  Musculoskeletal/Chest wall: Bilateral gynecomastia again noted. Scattered sclerotic lesions within the spine are grossly stable, largest at T12. No acute osseous findings seen.  IMPRESSION: 1. Interval enlargement of right pleural effusion, potentially partially loculated. Smaller left pleural effusion appears unchanged. 2. Interval resolution of pericardial effusion. 3. Mildly increased in bibasilar atelectasis. 4. Diffuse atherosclerosis with grossly stable dilatation of the distal aortic arch. 5. Stable bilateral adrenal nodules.   Electronically Signed   By: Richardean Sale M.D.   On: 06/06/2015 09:50   Dg Chest Portable 1 View  06/06/2015   CLINICAL DATA:  Shortness of breath today, the patient does undergo dialysis, some wheezing  EXAM: PORTABLE CHEST - 1 VIEW  COMPARISON:  Portable chest x-ray of 03/20/2015  FINDINGS: Aeration of the lung bases has improved somewhat. There are still hazy opacities at both lung bases consistent with atelectasis and effusions with pneumonia difficult to exclude. Also, there may be mild pulmonary vascular congestion present. A two-view chest x-ray would be helpful if possible. Cardiomegaly is stable.  IMPRESSION: 1. Slightly better aeration. 2. Bibasilar opacities consistent with atelectasis and effusions. Cannot exclude pneumonia. 3. Question mild pulmonary vascular congestion.   Electronically Signed   By: Ivar Drape M.D.   On: 06/06/2015 08:08     Medications:     . amiodarone  200 mg Oral Daily  . amLODipine  2.5 mg Oral  Daily  . atorvastatin  40 mg Oral Daily  . aztreonam  500 mg Intravenous 3 times per day  . budesonide-formoterol  2 puff Inhalation BID  . feeding supplement (NEPRO CARB STEADY)  237 mL Oral BID BM  . ferrous sulfate  325 mg Oral Q breakfast  . guaiFENesin  600 mg Oral BID  . ipratropium-albuterol  3 mL Nebulization Q4H  . magnesium oxide  400 mg Oral QPM  . methylPREDNISolone (SOLU-MEDROL) injection  40 mg Intravenous Q12H  . pantoprazole  40 mg Oral BID  . polyethylene glycol  17 g Oral Daily  . sevelamer carbonate  800 mg Oral TID AC  . sodium chloride  3 mL Intravenous Q12H  . tiotropium  18 mcg Inhalation Daily   acetaminophen **OR** acetaminophen, albuterol, alum & mag hydroxide-simeth, HYDROcodone-acetaminophen, nicotine, ondansetron **OR** ondansetron (ZOFRAN) IV, senna-docusate  Assessment/ Plan:  74 y.o. male with end-stage ren history of small bowel obstruction in May 2016, GERD, COPD,bladder cancer, history of TURBT  1.  ESRD. [address] dialysis.  T-T-S Dialysis tomorrow 2. Anemia of chronic kidney disease. - Procrit is on hold  due to bladder cancer - Consider hematology evaluation for anemia 3. Secondary hyperparathyroidism - Monitor phosphorus this admission 4. Diarrhea - improved overall but still had loose stool this AM - Kaopectate   LOS: 1 Kana Reimann 9/14/201610:16 AM

## 2015-06-07 NOTE — Progress Notes (Signed)
[facility] NAME: [name]    MR#:  [id number]  DATE OF BIRTH:  1941-07-11  SUBJECTIVE:  CHIEF COMPLAINT:  Patient is resting comfortably. Feeling tired and weak. Denies any abdominal pain or shortness of breath  REVIEW OF SYSTEMS:  CONSTITUTIONAL: No fever, fatigue or weakness.  EYES: No blurred or double vision.  EARS, NOSE, AND THROAT: No tinnitus or ear pain.  RESPIRATORY: No cough, shortness of breath, wheezing or hemoptysis.  CARDIOVASCULAR: No chest pain, orthopnea, edema.  GASTROINTESTINAL: No nausea, vomiting, diarrhea or abdominal pain.  GENITOURINARY: No dysuria, hematuria.  ENDOCRINE: No polyuria, nocturia,  HEMATOLOGY: No anemia, easy bruising or bleeding SKIN: No rash or lesion. MUSCULOSKELETAL: No joint pain or arthritis.   NEUROLOGIC: No tingling, numbness, weakness.  PSYCHIATRY: No anxiety or depression.   DRUG ALLERGIES:   Allergies  Allergen Reactions  . Penicillins Hives, Itching and Swelling    VITALS:  Blood pressure 110/61, pulse 75, temperature 97.8 F (36.6 C), temperature source Oral, resp. rate 20, height 6\' 2"  (1.88 m), weight 91.8 kg (202 lb 6.1 oz), SpO2 96 %.  PHYSICAL EXAMINATION:  GENERAL:  74 y.o.-year-old patient lying in the bed with no acute distress.  EYES: Pupils equal, round, reactive to light and accommodation. No scleral icterus. Extraocular muscles intact.  HEENT: Head atraumatic, normocephalic. Oropharynx and nasopharynx clear.  NECK:  Supple, no jugular venous distention. No thyroid enlargement, no tenderness.  LUNGS: Decreased breath sounds on right side, minimal expiratory wheezing, rales,rhonchi or crepitation. No use of accessory muscles of respiration.  CARDIOVASCULAR: S1, S2 normal. No murmurs, rubs, or gallops.  ABDOMEN: Soft, nontender, nondistended. Bowel sounds present. No organomegaly or mass.  EXTREMITIES: No pedal edema, cyanosis, or clubbing.   NEUROLOGIC: Cranial nerves II through XII are intact. Muscle strength 5/5 in all extremities. Sensation intact PSYCHIATRIC: The patient is alert and oriented x 3.  SKIN: No obvious rash, lesion, or ulcer.    LABORATORY PANEL:   CBC  Recent Labs Lab 06/06/15 0727 06/07/15 1133  WBC 13.3*  --   HGB 6.4* 7.9*  HCT 20.5* 24.2*  PLT 287  --    ------------------------------------------------------------------------------------------------------------------  Chemistries   Recent Labs Lab 06/06/15 0727  NA 142  K 4.1  CL 100*  CO2 29  GLUCOSE 125*  BUN 40*  CREATININE 4.59*  CALCIUM 8.5*  AST 21  ALT 8*  ALKPHOS 82  BILITOT 1.0   ------------------------------------------------------------------------------------------------------------------  Cardiac Enzymes  Recent Labs Lab 06/07/15 1133  TROPONINI 0.03   ------------------------------------------------------------------------------------------------------------------  RADIOLOGY:  Ct Chest Wo Contrast  06/06/2015   CLINICAL DATA:  Shortness of breath today. History of hemodialysis, prostate and bladder cancer. Initial encounter.  EXAM: CT CHEST WITHOUT CONTRAST  TECHNIQUE: Multidetector CT imaging of the chest was performed following the standard protocol without IV contrast.  COMPARISON:  Radiographs 06/06/2015 and 03/20/2015. CT 01/26/2015 and 09/04/2009. Abdominal CTA 03/21/2015.  FINDINGS: Mediastinum/Nodes: There are no enlarged mediastinal, hilar or axillary lymph nodes.Hilar assessment remains limited by the lack of intravenous contrast, although the hilar contours are unchanged. The thyroid gland, trachea and esophagus demonstrate no significant findings. The heart size is normal. There is no significant residual pericardial effusion. There is diffuse atherosclerosis of the aorta, great vessels and coronary arteries. Dilatation of the distal arch to 5.2 cm is similar to the prior study.  Lungs/Pleura: There are  dependent right-greater-than-left pleural effusions with interval enlargement of the right pleural effusion. This effusion  may be partially loculated. There are dependent opacities in both lungs consistent with atelectasis. Mild upper lobe paraseptal emphysematous changes are noted.  Upper abdomen: Bilateral adrenal nodularity appears unchanged from 2010, most consistent with adenomas. There is collecting system dilatation within the left kidney upper pole, unchanged from 4 months ago but new from 2010, as seen on CTA 3 months ago.  Musculoskeletal/Chest wall: Bilateral gynecomastia again noted. Scattered sclerotic lesions within the spine are grossly stable, largest at T12. No acute osseous findings seen.  IMPRESSION: 1. Interval enlargement of right pleural effusion, potentially partially loculated. Smaller left pleural effusion appears unchanged. 2. Interval resolution of pericardial effusion. 3. Mildly increased in bibasilar atelectasis. 4. Diffuse atherosclerosis with grossly stable dilatation of the distal aortic arch. 5. Stable bilateral adrenal nodules.   Electronically Signed   By: Richardean Sale M.D.   On: 06/06/2015 09:50   Dg Chest Portable 1 View  06/06/2015   CLINICAL DATA:  Shortness of breath today, the patient does undergo dialysis, some wheezing  EXAM: PORTABLE CHEST - 1 VIEW  COMPARISON:  Portable chest x-ray of 03/20/2015  FINDINGS: Aeration of the lung bases has improved somewhat. There are still hazy opacities at both lung bases consistent with atelectasis and effusions with pneumonia difficult to exclude. Also, there may be mild pulmonary vascular congestion present. A two-view chest x-ray would be helpful if possible. Cardiomegaly is stable.  IMPRESSION: 1. Slightly better aeration. 2. Bibasilar opacities consistent with atelectasis and effusions. Cannot exclude pneumonia. 3. Question mild pulmonary vascular congestion.   Electronically Signed   By: Ivar Drape M.D.   On: 06/06/2015 08:08     EKG:   Orders placed or performed during the hospital encounter of 06/06/15  . EKG 12-Lead  . EKG 12-Lead    ASSESSMENT AND PLAN:   Severe pleasant 74 year old male with end-stage renal disease on hemodialysis, anemia of chronic disease and COPD with 2 L of oxygen at night presents with shortness of breath and found to have anemia and possible COPD exacerbation.  1. Symptomatic anemia: Patient was admitted about a month ago with similar issue.  He had a colonoscopy in 2012 which did not show evidence of malignancy.  s/p 1 unit of PRBC. I will order stool guaiac test.   If his guaiac is positive or he develops melanoma or hematochezia then we should consult GI.  He was seen by oncology yesterday for anemia of chronic disease. His anemia is likely secondary to anemia of chronic disease from his end-stage renal disease and history of prostate cancer.  He had iron studies in August which showed a low iron level. Hemoglobin will be repeated in the a.m. His baseline hemoglobin is 7.5  continue ferrous sulfate.   2. Presyncope: I suspect this is from his symptomatic anemia and possibly his shortness of breath.  check orthostatic vital signs, transfused 1 unit of PRBC. Telemetry monitoring will also be continued.  3. Right pleural effusion: This is potentially partially loculated. I will ask for pulmonary consultation to evaluate this. I spoken with the patient about possible thoracentesis. The power of attorney would like the pulmonologist to contact her if this procedure will be ordered. I will also empirically start AZTREONAM for possible healthcare acquired pneumonia.  4.. End-stage renal disease on hemodialysis:  Had hemodialysis yesterday. Follow-up with nephrology nephrology.   5. Acute exacerbation of COPD:t continue DuoNeb's and start IV steroids for mild COPD exacerbation.  6. Hyperlipidemia: Continue Lipitor  7. History of PAF: Continue  amiodarone.  8. Essential  hypertension: Continue Norvasc.     All the records are reviewed and case discussed with Care Management/Social Workerr. Management plans discussed with the patient, family and they are in agreement.  CODE STATUS: Full code  TOTAL TIME TAKING CARE OF THIS PATIENT: 35 minutes.   POSSIBLE D/C IN 2-3 DAYS, DEPENDING ON CLINICAL CONDITION.   Nicholes Mango M.D on 06/07/2015 at 2:16 PM  Between 7am to 6pm - Pager - [phone] After 6pm go to [email or web address] - password [email or web address] [facility]  Office  [phone]  CC: Primary care physician; Dion Body, MD

## 2015-06-07 NOTE — Progress Notes (Signed)
Patient had no c/o pain this shift Patient continues on IV ABX and breathing treatment  PT worked with patient today and is recommending skilled PT services at SNF Patient is tolerating diet well  VSS
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
## 2015-06-07 NOTE — Care Management (Signed)
Admitted to [facility] with the diagnosis of anemia. [name] is POA. [phone]). Ida's son Elenore Rota lives with Mr. Thielke. Receives services thru [facility], Physical therapy, and occupational therapy. No home oxygen. [facility] 05/07/14 x 21 days. Last seen Dr. Netty Starring in his office this summer. No Life Alert. Rolling walker to aid in ambulation. Good appetite. No falls. Dialysis Tuesday-Thursday-Saturday on [address] since October 2015.  Shelbie Ammons RN MSN Care Management [phone]

## 2015-06-07 NOTE — Progress Notes (Signed)
Date: 06/07/2015,   MRN# [id number] [name] 10-10-1940 Code Status:     Code Status Orders        Start     Ordered   06/06/15 1340  Full code   Continuous     06/06/15 1340    Advance Directive Documentation        Most Recent Value   Type of Advance Directive  Healthcare Power of Attorney   Pre-existing out of facility DNR order (yellow form or pink MOST form)     "MOST" Form in Place?       Hosp day:@LENGTHOFSTAYDAYS @ Referring MD: @ATDPROV @     PCP:      AdmissionWeight: 203 lb (92.08 kg)                 CurrentWeight: 202 lb 6.1 oz (91.8 kg)    CC: evaluate for pleural effusion  HPI: This is a 74 yr old male withcopd, anemia, CRF, on dialysis, afib, on amiodarone,  bilateral pleural effusion, right larger than the left. Per chest CT ? Loculated.   PMHX:   Past Medical History  Diagnosis Date  . PAF (paroxysmal atrial fibrillation)     a. new onset 12/2014 in the setting of UTI, sepsis, hypotension, and anemia; b. not on long term anticoagulation given anemia; c. family aware of stroke risk, they are ok with this; d. on amiodarone   . Chronic combined systolic and diastolic CHF (congestive heart failure)     a. echo 12/2014: EF 25-30%, anterior wall wall motion abnormalities; b. planned ischemic evaluation once patient is stable medically; c. echo 01/2015: EF 45-50%, no RWMA, mild AT, LA mildly dilated, mod pericardial effusion along LV free wall, no evidence of hemodynamic compromise  . ESRD on hemodialysis     a. Tuesday, Thursday, and Saturdays  . Anemia     a. baseline hgb ~ 8  . History of small bowel obstruction     a. 01/2015  . History of septic shock     a. 01/2015  . GERD (gastroesophageal reflux disease)   . Allergic rhinitis   . COPD (chronic obstructive pulmonary disease)   . Hypercholesteremia   . Cognitive communication deficit   . Magnesium deficiency   . Dementia   . Iliac aneurysm   . Incontinence   . Hydronephrosis   . Overactive  bladder   . Detrusor sphincter dyssynergia   . Mini stroke   . Bladder cancer   . Prostate cancer   . Renal insufficiency   . Hypertension    Surgical Hx:  Past Surgical History  Procedure Laterality Date  . Cystoscopy w/ ureteral stent placement    . Transurethral resection of bladder tumor with gyrus (turbt-gyrus)    . Ureteral stent placement    . Prostate surgery      Removal  . Av fistula placement      left arm   Family Hx:  Family History  Problem Relation Age of Onset  . Stroke Mother    Social Hx:   Social History  Substance Use Topics  . Smoking status: Former Smoker -- 0.25 packs/day for 42 years    Quit date: 05/13/2015  . Smokeless tobacco: Never Used  . Alcohol Use: No   Medication:    Home Medication:  No current outpatient prescriptions on file.  Current Medication: @CURMEDTAB @   Allergies:  Penicillins  Review of Systems: Gen:  Denies  fever, sweats, chills HEENT: Denies blurred vision, double  vision, ear pain, eye pain, hearing loss, nose bleeds, sore throat Cvc:  No dizziness, chest pain or heaviness Resp:  Wheezing earlier, no hemoptysis, tightness or cough  Gi: Denies swallowing difficulty, stomach pain, nausea or vomiting, diarrhea, constipation, bowel incontinence Gu:  Denies bladder incontinence, burning urine Ext:   No Joint pain, stiffness or swelling Skin: No skin rash, easy bruising or bleeding or hives Endoc:  No polyuria, polydipsia , polyphagia or weight change Psych: No depression, insomnia or hallucinations  Other:  All other systems negative  Physical Examination:   VS: BP 136/87 mmHg  Pulse 80  Temp(Src) 98.3 F (36.8 C) (Oral)  Resp 18  Ht 6\' 2"  (1.88 m)  Wt 202 lb 6.1 oz (91.8 kg)  BMI 25.97 kg/m2  SpO2 96%  General Appearance: No distress  Neuro/psych without focal findings, mental status, speech normal, alert and oriented, cranial nerves 2-12 intact, reflexes normal and symmetric, sensation grossly normal   HEENT: PERRLA, EOM intact, no ptosis, no other lesions noticed, Mallampati: Pulmonary:.No wheezing, No rales  Sputum Production:   Cardiovascular:  Normal S1,S2.  No m/r/g.  Abdominal aorta pulsation normal.    Abdomen:Benign, Soft, non-tender, No masses, hepatosplenomegaly, No lymphadenopathy Endoc: No evident thyromegaly, no signs of acromegaly or Cushing features Skin:   warm, no rashes, no ecchymosis  Extremities: normal, no cyanosis, clubbing, no edema, warm with normal capillary refill. Other findings:   Labs results:   Recent Labs     06/06/15  0727  06/07/15  1133  HGB  6.4*  7.9*  HCT  20.5*  24.2*  MCV  92.4   --   WBC  13.3*   --   BUN  40*   --   CREATININE  4.59*   --   GLUCOSE  125*   --   CALCIUM  8.5*   --   ,    No results for input(s): PH in the last 72 hours.  Invalid input(s): PCO2, PO2, BASEEXCESS, BASEDEFICITE, TFT  Culture results:     Rad results:  CLINICAL DATA: Shortness of breath today. History of hemodialysis, prostate and bladder cancer. Initial encounter.  EXAM: CT CHEST WITHOUT CONTRAST  TECHNIQUE: Multidetector CT imaging of the chest was performed following the standard protocol without IV contrast.  COMPARISON: Radiographs 06/06/2015 and 03/20/2015. CT 01/26/2015 and 09/04/2009. Abdominal CTA 03/21/2015.  FINDINGS: Mediastinum/Nodes: There are no enlarged mediastinal, hilar or axillary lymph nodes.Hilar assessment remains limited by the lack of intravenous contrast, although the hilar contours are unchanged. The thyroid gland, trachea and esophagus demonstrate no significant findings. The heart size is normal. There is no significant residual pericardial effusion. There is diffuse atherosclerosis of the aorta, great vessels and coronary arteries. Dilatation of the distal arch to 5.2 cm is similar to the prior study.  Lungs/Pleura: There are dependent right-greater-than-left pleural effusions with interval enlargement  of the right pleural effusion. This effusion may be partially loculated. There are dependent opacities in both lungs consistent with atelectasis. Mild upper lobe paraseptal emphysematous changes are noted.  Upper abdomen: Bilateral adrenal nodularity appears unchanged from 2010, most consistent with adenomas. There is collecting system dilatation within the left kidney upper pole, unchanged from 4 months ago but new from 2010, as seen on CTA 3 months ago.  Musculoskeletal/Chest wall: Bilateral gynecomastia again noted. Scattered sclerotic lesions within the spine are grossly stable, largest at T12. No acute osseous findings seen.  IMPRESSION: 1. Interval enlargement of right pleural effusion, potentially partially loculated. Smaller left  pleural effusion appears unchanged. 2. Interval resolution of pericardial effusion. 3. Mildly increased in bibasilar atelectasis. 4. Diffuse atherosclerosis with grossly stable dilatation of the distal aortic arch. 5. Stable bilateral adrenal nodules.   Electronically Signed  By: Richardean Sale M.D.  On: 06/06/2015 09:50           Vitals     Height Weight BMI (Calculated)    6\' 2"  (1.88 m) 202 lb 6.1 oz (91.8 kg) 26.1      Interpretation Summary     CLINICAL DATA: Shortness of breath today. History of hemodialysis, prostate and bladder cancer. Initial encounter.  EXAM: CT CHEST WITHOUT CONTRAST  TECHNIQUE: Multidetector CT imaging of the chest was performed following the standard protocol without IV contrast.  COMPARISON: Radiographs 06/06/2015 and 03/20/2015. CT 01/26/2015 and 09/04/2009. Abdominal CTA 03/21/2015.  FINDINGS: Mediastinum/Nodes: There are no enlarged mediastinal, hilar or axillary lymph nodes.Hilar assessment remains limited by the lack of intravenous contrast, although the hilar contours are unchanged. The thyroid gland, trachea and esophagus demonstrate no significant findings. The  heart size is normal. There is no significant residual pericardial effusion. There is diffuse atherosclerosis of the aorta, great vessels and coronary arteries. Dilatation of the distal arch to 5.2 cm is similar to the prior study.  Lungs/Pleura: There are dependent right-greater-than-left pleural effusions with interval enlargement of the right pleural effusion. This effusion may be partially loculated. There are dependent opacities in both lungs consistent with atelectasis. Mild upper lobe paraseptal emphysematous changes are noted.  Upper abdomen: Bilateral adrenal nodularity appears unchanged from 2010, most consistent with adenomas. There is collecting system dilatation within the left kidney upper pole, unchanged from 4 months ago but new from 2010, as seen on CTA 3 months ago.  Musculoskeletal/Chest wall: Bilateral gynecomastia again noted. Scattered sclerotic lesions within the spine are grossly stable, largest at T12. No acute osseous findings seen.  IMPRESSION: 1. Interval enlargement of right pleural effusion, potentially partially loculated. Smaller left pleural effusion appears unchanged. 2. Interval resolution of pericardial effusion. 3. Mildly increased in bibasilar atelectasis. 4. Diffuse atherosclerosis with grossly stable dilatation of the distal aortic arch. 5. Stable bilateral adrenal nodules.   Electronically Signed  By: Richardean Sale M.D.  On: 06/06/2015 09:50        Other:   Assessment and Plan: Bilateral pleural effusion, right larger ? Loculated, hx of end stage renal failure on dialysis. Will get right lateral decubitus film Per above consider thoracentesis  Copd, stable Agree with present regimen   I have personally obtained a history, examined the patient, evaluated laboratory and imaging results, formulated the assessment and plan and placed orders.  The Patient requires high complexity decision making for assessment and  support, frequent evaluation and titration of therapies, application of advanced monitoring technologies and extensive interpretation of multiple databases.   Emran Molzahn,M.D. Pulmonary & Critical care Medicine [facility]

## 2015-06-07 NOTE — Progress Notes (Signed)
PT Cancellation Note  Patient Details Name: FINNEUS KANESHIRO MRN: [id number] DOB: Jun 28, 1941   Cancelled Treatment:    Reason Eval/Treat Not Completed: Patient not medically ready. PT reviewed patient's chart and noted patient's last hemoglobin taken yesterday was 6.4. Patient noted to have 7.5 as his baseline and that he received transfusion yesterday. PT will continue to follow patient and monitor hemoglobin levels, and re-attempt mobility evaluation when patient is medically appropriate.   Kerman Passey, PT, DPT    06/07/2015, 8:11 AM

## 2015-06-07 NOTE — Plan of Care (Addendum)
Problem: Discharge Progression Outcomes Goal: Discharge plan in place and appropriate Outcome: Progressing Individualization of Care Pt prefers to be called [name] Hx of paroxysmal atrial fibrillation, CHF, ESRD, anemia, small bowel obstruction, sepsis, GERD, COPD, hypercholesteremia, magnesium deficiency, dementia, Iliac aneurysm, incontinence, hydronephrosis, overactive bladder, stroke, bladder and prostate cancer, HTN, renal insufficiency.  Patient was positive for MRSA in nares on last admission less than 30 days ago.  He remains on MRSA precautions.    Goal: Other Discharge Outcomes/Goals 1.  Pain:  Patient without complaints of pain this shift. 2.  Hemodynamically:  Patient afebrile and VSS throughout shift.  Patient admitted for anemia with Hgb of 6.5.  Transfused 1 unit of PRBC this shift.  Patient tolerated the transfusion well without signs of reaction.  BP 131/73 mmHg  Pulse 87  Temp(Src) 98.5 F (36.9 C) (Oral)  Resp 18  Ht 6\' 2"  (1.88 m)  Wt 202 lb 6.1 oz (91.8 kg)  BMI 25.97 kg/m2  SpO2 94% 3.  Complications:  Patient had complaints of SOB.  Nebulizer treatments Q4 with SOB resolved.  Lab called with Troponin critical of 0.06.  Spoke with Dr. Lavetta Nielsen who said this was an expected result after Dialysis and he was not worried about a 0.01 increase from the previous level.  Troponin will be re-checked in the morning lab. 4.  Diet:  Patient on renal/fluid restricted diet.  Patient did not each this shift but is tolerating fluid restriction. 5.  Activity:  Patient on bedrest.  Patient reported weakness at home prior to admittance. He stated that he was too weak to get out of bed this shift for assessment.

## 2015-06-07 NOTE — Consult Note (Signed)
ANTIBIOTIC CONSULT NOTE - INITIAL  Pharmacy Consult for levaquin Indication: pneumonia HCAP  Allergies  Allergen Reactions  . Penicillins Hives, Itching and Swelling    Patient Measurements: Height: 6\' 2"  (188 cm) Weight: 202 lb 6.1 oz (91.8 kg) IBW/kg (Calculated) : 82.2 Adjusted Body Weight:   Vital Signs: Temp: 97.8 F (36.6 C) (09/14 1308) Temp Source: Oral (09/14 1308) BP: 110/61 mmHg (09/14 1308) Pulse Rate: 75 (09/14 1308) Intake/Output from previous day: 09/13 0701 - 09/14 0700 In: 395 [Blood:395] Out: 1502  Intake/Output from this shift:    Labs:  Recent Labs  06/06/15 0727 06/07/15 1133  WBC 13.3*  --   HGB 6.4* 7.9*  PLT 287  --   CREATININE 4.59*  --    Estimated Creatinine Clearance: 16.7 mL/min (by C-G formula based on Cr of 4.59). No results for input(s): VANCOTROUGH, VANCOPEAK, VANCORANDOM, GENTTROUGH, GENTPEAK, GENTRANDOM, TOBRATROUGH, TOBRAPEAK, TOBRARND, AMIKACINPEAK, AMIKACINTROU, AMIKACIN in the last 72 hours.   Microbiology: Recent Results (from the past 720 hour(s))  C difficile quick scan w PCR reflex     Status: Abnormal   Collection Time: 05/08/15  2:49 PM  Result Value Ref Range Status   C Diff antigen POSITIVE (A) NEGATIVE Final   C Diff toxin NEGATIVE NEGATIVE Final   C Diff interpretation   Final    Negative for toxigenic C. difficile. Toxin gene and active toxin production not detected. May be a nontoxigenic strain of C. difficile bacteria present, lacking the ability to produce toxin.  Clostridium Difficile by PCR     Status: None   Collection Time: 05/08/15  2:49 PM  Result Value Ref Range Status   Toxigenic C Difficile by pcr NEGATIVE NEGATIVE Final    Medical History: Past Medical History  Diagnosis Date  . PAF (paroxysmal atrial fibrillation)     a. new onset 12/2014 in the setting of UTI, sepsis, hypotension, and anemia; b. not on long term anticoagulation given anemia; c. family aware of stroke risk, they are ok with  this; d. on amiodarone   . Chronic combined systolic and diastolic CHF (congestive heart failure)     a. echo 12/2014: EF 25-30%, anterior wall wall motion abnormalities; b. planned ischemic evaluation once patient is stable medically; c. echo 01/2015: EF 45-50%, no RWMA, mild AT, LA mildly dilated, mod pericardial effusion along LV free wall, no evidence of hemodynamic compromise  . ESRD on hemodialysis     a. Tuesday, Thursday, and Saturdays  . Anemia     a. baseline hgb ~ 8  . History of small bowel obstruction     a. 01/2015  . History of septic shock     a. 01/2015  . GERD (gastroesophageal reflux disease)   . Allergic rhinitis   . COPD (chronic obstructive pulmonary disease)   . Hypercholesteremia   . Cognitive communication deficit   . Magnesium deficiency   . Dementia   . Iliac aneurysm   . Incontinence   . Hydronephrosis   . Overactive bladder   . Detrusor sphincter dyssynergia   . Mini stroke   . Bladder cancer   . Prostate cancer   . Renal insufficiency   . Hypertension     Medications:  Scheduled:  . amiodarone  200 mg Oral Daily  . amLODipine  2.5 mg Oral Daily  . atorvastatin  40 mg Oral Daily  . budesonide-formoterol  2 puff Inhalation BID  . feeding supplement (NEPRO CARB STEADY)  237 mL Oral BID BM  .  ferrous sulfate  325 mg Oral Q breakfast  . guaiFENesin  600 mg Oral BID  . ipratropium-albuterol  3 mL Nebulization Q4H  . [START ON 06/09/2015] levofloxacin (LEVAQUIN) IV  500 mg Intravenous Q48H  . levofloxacin (LEVAQUIN) IV  750 mg Intravenous Once  . magnesium oxide  400 mg Oral QPM  . methylPREDNISolone (SOLU-MEDROL) injection  40 mg Intravenous Q12H  . pantoprazole  40 mg Oral BID  . polyethylene glycol  17 g Oral Daily  . sevelamer carbonate  800 mg Oral TID AC  . sodium chloride  3 mL Intravenous Q12H  . tiotropium  18 mcg Inhalation Daily   Assessment: Pt is a 74 year old male with suspected HCAP. Pt is on HD  Goal of Therapy:  resolution of  infection  Plan:  will give levofloxacin 750mg  IV once then 500mg  IV q 48 hours  Pharmacy to continue to monitor Thank you for the consult  Alaa Mullally D Gerry Heaphy, Pharm.D Clinical Pharmacist   06/07/2015,2:05 PM

## 2015-06-07 NOTE — Progress Notes (Signed)
Initial Nutrition Assessment   INTERVENTION:   Meals and Snacks: Cater to patient preferences Medical Food Supplement Therapy: Agree with Nepro as ordered, if po intake poor will increase to TID.   NUTRITION DIAGNOSIS:   Increased nutrient needs related to chronic illness as evidenced by estimated needs.  GOAL:   Patient will meet greater than or equal to 90% of their needs  MONITOR:    (Energy Intake, Skin, Electrolyte and Renal Profile, Anthropometrics, UOP)  REASON FOR ASSESSMENT:    (Renal Diet, Dialysis pt)    ASSESSMENT:   Pt admitted with anemia and diarrhea. Pt with h/o ESRD on Hd. Pt on isolation secondary to MRSA.  Past Medical History  Diagnosis Date  . PAF (paroxysmal atrial fibrillation)     a. new onset 12/2014 in the setting of UTI, sepsis, hypotension, and anemia; b. not on long term anticoagulation given anemia; c. family aware of stroke risk, they are ok with this; d. on amiodarone   . Chronic combined systolic and diastolic CHF (congestive heart failure)     a. echo 12/2014: EF 25-30%, anterior wall wall motion abnormalities; b. planned ischemic evaluation once patient is stable medically; c. echo 01/2015: EF 45-50%, no RWMA, mild AT, LA mildly dilated, mod pericardial effusion along LV free wall, no evidence of hemodynamic compromise  . ESRD on hemodialysis     a. Tuesday, Thursday, and Saturdays  . Anemia     a. baseline hgb ~ 8  . History of small bowel obstruction     a. 01/2015  . History of septic shock     a. 01/2015  . GERD (gastroesophageal reflux disease)   . Allergic rhinitis   . COPD (chronic obstructive pulmonary disease)   . Hypercholesteremia   . Cognitive communication deficit   . Magnesium deficiency   . Dementia   . Iliac aneurysm   . Incontinence   . Hydronephrosis   . Overactive bladder   . Detrusor sphincter dyssynergia   . Mini stroke   . Bladder cancer   . Prostate cancer   . Renal insufficiency   . Hypertension       Diet Order:  Diet renal/carb modified with fluid restriction Diet-HS Snack?: Nothing; Room service appropriate?: Yes; Fluid consistency:: Thin    Current Nutrition: Pt ate 80% of breakfast tray this am on visit of eggs, home fries and toast.   Food/Nutrition-Related History: Pt reports po intake fluctuates, stating 'sometimes eating a lot and sometimes a little' for the day. Pt reports drinking Nepro supplements PTA. Pt states 'but I'm an eater.'   Medications: ferrous sulfate, mag-ox, protonix, solumderol, miralax, renvela, 61mL NS q12hours  Electrolyte/Renal Profile and Glucose Profile:   Recent Labs Lab 06/06/15 0727  NA 142  K 4.1  CL 100*  CO2 29  BUN 40*  CREATININE 4.59*  CALCIUM 8.5*  GLUCOSE 125*   Protein Profile:  Recent Labs Lab 06/06/15 0727  ALBUMIN 2.8*   Nutritional Anemia Profile:  CBC Latest Ref Rng 06/07/2015 06/06/2015 05/08/2015  WBC 3.8 - 10.6 K/uL - 13.3(H) -  Hemoglobin 13.0 - 18.0 g/dL 7.9(L) 6.4(L) 7.6(L)  Hematocrit 40.0 - 52.0 % 24.2(L) 20.5(L) 23.9(L)  Platelets 150 - 440 K/uL - 287 -    Gastrointestinal Profile: Last BM:  06/06/2015 loose    Nutrition-Focused Physical Exam Findings:  Unable to complete Nutrition-Focused physical exam at this time.    Weight Change: Pt reports weight of 203lbs one month ago at MD office. Per [facility] pt  weight relatively stable.   Skin:  Reviewed, no issues; RD notes stage II pressure ulcer documented on 05/04/2015 last admission.   Height:   Ht Readings from Last 1 Encounters:  06/06/15 6\' 2"  (1.88 m)    Weight:   Wt Readings from Last 1 Encounters:  06/06/15 202 lb 6.1 oz (91.8 kg)   Wt Readings from Last 10 Encounters:  06/06/15 202 lb 6.1 oz (91.8 kg)  06/05/15 203 lb (92.08 kg)  05/06/15 209 lb 14.1 oz (95.2 kg)  04/10/15 215 lb (97.523 kg)  03/25/15 202 lb 9.6 oz (91.9 kg)  03/18/15 210 lb (95.255 kg)  03/09/15 207 lb (93.895 kg)  02/27/15 209 lb (94.802 kg)  02/27/15 209 lb  (94.802 kg)  02/16/15 199 lb 15.3 oz (90.7 kg)     BMI:  Body mass index is 25.97 kg/(m^2).  Estimated Nutritional Needs:   Kcal:  BEE: 1727kcals, TEE: (IF 1.1-1.3)(AF 1.2) 2280-2695kcals  Protein:  110-137g protein (1.2-1.5g/kg)   Fluid:  UOP+1L  EDUCATION NEEDS:   No education needs identified at this time   [place], RD, LDN Pager [phone]

## 2015-06-08 LAB — RETICULOCYTES
RBC.: 2.3 MIL/uL — AB (ref 4.40–5.90)
Retic Count, Absolute: 78.2 10*3/uL (ref 19.0–183.0)
Retic Ct Pct: 3.4 % — ABNORMAL HIGH (ref 0.4–3.1)

## 2015-06-08 LAB — CBC WITH DIFFERENTIAL/PLATELET
BASOS ABS: 0 10*3/uL (ref 0–0.1)
Basophils Relative: 0 %
EOS ABS: 0 10*3/uL (ref 0–0.7)
EOS PCT: 0 %
HCT: 20.2 % — ABNORMAL LOW (ref 40.0–52.0)
HEMOGLOBIN: 6.7 g/dL — AB (ref 13.0–18.0)
Lymphocytes Relative: 6 %
Lymphs Abs: 0.6 10*3/uL — ABNORMAL LOW (ref 1.0–3.6)
MCH: 29.5 pg (ref 26.0–34.0)
MCHC: 33 g/dL (ref 32.0–36.0)
MCV: 89.5 fL (ref 80.0–100.0)
Monocytes Absolute: 0.5 10*3/uL (ref 0.2–1.0)
Monocytes Relative: 5 %
NEUTROS PCT: 89 %
Neutro Abs: 8.2 10*3/uL — ABNORMAL HIGH (ref 1.4–6.5)
PLATELETS: 218 10*3/uL (ref 150–440)
RBC: 2.26 MIL/uL — AB (ref 4.40–5.90)
RDW: 17.5 % — ABNORMAL HIGH (ref 11.5–14.5)
WBC: 9.3 10*3/uL (ref 3.8–10.6)

## 2015-06-08 LAB — RENAL FUNCTION PANEL
ALBUMIN: 2.8 g/dL — AB (ref 3.5–5.0)
Anion gap: 14 (ref 5–15)
BUN: 55 mg/dL — AB (ref 6–20)
CALCIUM: 8.6 mg/dL — AB (ref 8.9–10.3)
CO2: 28 mmol/L (ref 22–32)
CREATININE: 4.45 mg/dL — AB (ref 0.61–1.24)
Chloride: 96 mmol/L — ABNORMAL LOW (ref 101–111)
GFR calc Af Amer: 14 mL/min — ABNORMAL LOW (ref >60)
GFR, EST NON AFRICAN AMERICAN: 12 mL/min — AB (ref >60)
GLUCOSE: 156 mg/dL — AB (ref 65–99)
PHOSPHORUS: 3.1 mg/dL (ref 2.5–4.6)
Potassium: 4.9 mmol/L (ref 3.5–5.1)
SODIUM: 138 mmol/L (ref 135–145)

## 2015-06-08 LAB — PREPARE RBC (CROSSMATCH)

## 2015-06-08 LAB — SEDIMENTATION RATE: Sed Rate: 78 mm/hr — ABNORMAL HIGH (ref 0–16)

## 2015-06-08 LAB — PHOSPHORUS: PHOSPHORUS: 2.7 mg/dL (ref 2.5–4.6)

## 2015-06-08 LAB — TSH: TSH: 1.07 u[IU]/mL (ref 0.350–4.500)

## 2015-06-08 LAB — FERRITIN: Ferritin: 6660 ng/mL — ABNORMAL HIGH (ref 24–336)

## 2015-06-08 LAB — LACTATE DEHYDROGENASE: LDH: 203 U/L — AB (ref 98–192)

## 2015-06-08 MED ORDER — PREDNISONE 50 MG PO TABS
60.0000 mg | ORAL_TABLET | Freq: Every day | ORAL | Status: DC
  Administered 2015-06-09 – 2015-06-10 (×2): 60 mg via ORAL
  Filled 2015-06-08 (×2): qty 1

## 2015-06-08 MED ORDER — LEVOFLOXACIN 500 MG PO TABS
500.0000 mg | ORAL_TABLET | ORAL | Status: DC
  Administered 2015-06-09: 500 mg via ORAL
  Filled 2015-06-08: qty 1

## 2015-06-08 MED ORDER — SODIUM CHLORIDE 0.9 % IV SOLN: Freq: Once | INTRAVENOUS | Status: DC

## 2015-06-08 NOTE — Care Management Important Message (Signed)
Important Message  Patient Details  Name: [name] MRN: [id number] Date of Birth: [birth year]   Medicare Important Message Given:  Yes-second notification given    Shelbie Ammons, RN 06/08/2015, 10:08 AM

## 2015-06-08 NOTE — Clinical Social Work Note (Signed)
Clinical Social Work Assessment  Patient Details  Name: [name] MRN: [id number] Date of Birth: 05/26/1941  Date of referral:  06/08/15               Reason for consult:  Facility Placement, Care Management Concerns                Permission sought to share information with:  Case Manager Permission granted to share information::  Yes, Verbal Permission Granted  Name::      Brenda  Agency::   RN Case Manager  Relationship::     Contact Information:     Housing/Transportation Living arrangements for the past 2 months:  Single Family Home Source of Information:  Patient, Power of Attorney Patient Interpreter Needed:  None Criminal Activity/Legal Involvement Pertinent to Current Situation/Hospitalization:  No - Comment as needed Significant Relationships:  Other Family Members Lives with:  Other (Comment) (Lives with his HPOA's son. ) Do you feel safe going back to the place where you live?  Yes Need for family participation in patient care:  Yes (Comment)  Care giving concerns: Patient lives in [place] with a roommate Don.    Social Worker assessment / plan: Clinical Social Worker (CSW) received verbal consult from MD that PT is recommending SNF. Per MD patient will be ready for D/C today. CSW met with patient alone at bedside. Patient was alert and oriented and sitting in the chair. CSW introduced self and explained role of CSW department. Patient reported that he lives in [place] with a roommate Don. Per patient Ida Cousins [phone] is his HPOA and primary support. Patient reported that he goes to Dialysis Tuesdays, Thursdays and Saturdays at [facility] on [address]. Patient's chair time is 7 am and his transport picks him up at 6 am. CSW explained to patient that PT is recommending SNF. Patient reported that he has been to [facility] several time. Patient reported that he would prefer to go home with [facility] and requested Linda as his nurse. Patient  spoke fondly of [facility] and Linda.  Patient's HPOA Ida was up visiting patient after assessment was complete. CSW met with patient for a second time, which included Ida. Ida is agreeable for patient to return home with [facility]. Ida requested home health PT, OT, RN and nurse aide. Ida also requested a nebulizer machine. Ida reported that she would like for patient to go to outpatient PT when he is able. Ida reported that she is going to speak with patient's renal MD Dr. Singh and ask if patient can do 2 days of dialysis instead of 3. Ida also requested EMS home today and reported that Don will be at home waiting for patient this afternoon. Patient asked CSW what he can do to avoid a readmission. Patient reported that he has been in and out of the hospital several times recently. CSW encouraged patient to utilize [facility] and call his nurse when needed. RN Case Manager aware of above. Please reconsult if future social work needs arise. CSW signing off.    Employment status:  Disabled (Comment on whether or not currently receiving Disability), Retired Insurance information:  Managed Medicare PT Recommendations:  Skilled Nursing Facility Information / Referral to community resources:  Other (Comment Required) (RN Case Manager will arrange Home Health. )  Patient/Family's Response to care: Patient and his HPOA are in agreement that he will return home today with [facility].     Patient/Family's Understanding of and Emotional Response to Diagnosis, Current Treatment, and Prognosis: Patient and [name] were very pleasant and thanked CSW for visit.   Emotional Assessment Appearance:  Appears stated age Attitude/Demeanor/Rapport:    Affect (typically observed):  Accepting, Adaptable, Pleasant Orientation:  Oriented to Self, Oriented to Place, Oriented to  Time, Oriented to Situation Alcohol / Substance use:  Not Applicable Psych involvement (Current and /or in the  community):  No (Comment)  Discharge Needs  Concerns to be addressed:  Denies Needs/Concerns at this time Readmission within the last 30 days:  Yes Current discharge risk:  Chronically ill Barriers to Discharge:  No Barriers Identified   Loralyn Freshwater, LCSW 06/08/2015, 10:23 AM

## 2015-06-08 NOTE — Progress Notes (Signed)
Date: 06/08/2015,   MRN# [id number] [name] February 25, 1941 Code Status:     Code Status Orders        Start     Ordered   06/06/15 1340  Full code   Continuous     06/06/15 1340    Advance Directive Documentation        Most Recent Value   Type of Advance Directive  Healthcare Power of Attorney   Pre-existing out of facility DNR order (yellow form or pink MOST form)     "MOST" Form in Place?       Hosp day:@LENGTHOFSTAYDAYS @ Referring MD: @ATDPROV @          HPI: was away on dialysis, by report weak, not in respiratory distress  PMHX:   Past Medical History  Diagnosis Date  . PAF (paroxysmal atrial fibrillation)     a. new onset 12/2014 in the setting of UTI, sepsis, hypotension, and anemia; b. not on long term anticoagulation given anemia; c. family aware of stroke risk, they are ok with this; d. on amiodarone   . Chronic combined systolic and diastolic CHF (congestive heart failure)     a. echo 12/2014: EF 25-30%, anterior wall wall motion abnormalities; b. planned ischemic evaluation once patient is stable medically; c. echo 01/2015: EF 45-50%, no RWMA, mild AT, LA mildly dilated, mod pericardial effusion along LV free wall, no evidence of hemodynamic compromise  . ESRD on hemodialysis     a. Tuesday, Thursday, and Saturdays  . Anemia     a. baseline hgb ~ 8  . History of small bowel obstruction     a. 01/2015  . History of septic shock     a. 01/2015  . GERD (gastroesophageal reflux disease)   . Allergic rhinitis   . COPD (chronic obstructive pulmonary disease)   . Hypercholesteremia   . Cognitive communication deficit   . Magnesium deficiency   . Dementia   . Iliac aneurysm   . Incontinence   . Hydronephrosis   . Overactive bladder   . Detrusor sphincter dyssynergia   . Mini stroke   . Bladder cancer   . Prostate cancer   . Renal insufficiency   . Hypertension    Surgical Hx:  Past Surgical History  Procedure Laterality Date  . Cystoscopy w/ ureteral  stent placement    . Transurethral resection of bladder tumor with gyrus (turbt-gyrus)    . Ureteral stent placement    . Prostate surgery      Removal  . Av fistula placement      left arm   Family Hx:  Family History  Problem Relation Age of Onset  . Stroke Mother    Social Hx:   Social History  Substance Use Topics  . Smoking status: Former Smoker -- 0.25 packs/day for 45 years    Quit date: 05/13/2015  . Smokeless tobacco: Never Used  . Alcohol Use: No   Medication:    Home Medication:  No current outpatient prescriptions on file.  Current Medication: @CURMEDTAB @   Allergies:  Penicillins  Review of Systems: Gen:  Denies  fever, sweats, chills HEENT: Denies blurred vision, double vision, ear pain, eye pain, hearing loss, nose bleeds, sore throat Cvc:  No dizziness, chest pain or heaviness Resp:  No resp distress  Gi: Denies swallowing difficulty, stomach pain, nausea or vomiting, diarrhea, constipation, bowel incontinence Gu:  Denies bladder incontinence, burning urine Ext:   No Joint pain, stiffness or swelling Skin: No skin  rash, easy bruising or bleeding or hives Endoc:  No polyuria, polydipsia , polyphagia or weight change Psych: No depression, insomnia or hallucinations  Other:  All other systems negative  Physical Examination:   VS: BP 134/83 mmHg  Pulse 72  Temp(Src) 97.5 F (36.4 C) (Oral)  Resp 25  Ht 6\' 2"  (1.88 m)  Wt 202 lb 6.1 oz (91.8 kg)  BMI 25.97 kg/m2  SpO2 97%  General Appearance: No distress  Neuro: without focal findings, mental status, speech normal, alert and oriented, cranial nerves 2-12 intact, reflexes normal and symmetric, sensation grossly normal  HEENT: PERRLA, EOM intact, no ptosis, no other lesions noticed, Mallampati: Pulmonary:.No wheezing, No rales  Sputum Production:   Cardiovascular:  Normal S1,S2.  No m/r/g.  Abdominal aorta pulsation normal.    Abdomen:Benign, Soft, non-tender, No masses, hepatosplenomegaly, No  lymphadenopathy Endoc: No evident thyromegaly, no signs of acromegaly or Cushing features Skin:   warm, no rashes, no ecchymosis  Extremities: normal, no cyanosis, clubbing, no edema, warm with normal capillary refill. Other findings:   Labs results:   Recent Labs     06/06/15  0727  06/07/15  1133  06/08/15  0520  06/08/15  1135  HGB  6.4*  7.9*  6.7*   --   HCT  20.5*  24.2*  20.2*   --   MCV  92.4   --   89.5   --   WBC  13.3*   --   9.3   --   BUN  40*   --    --   55*  CREATININE  4.59*   --    --   4.45*  GLUCOSE  125*   --    --   156*  CALCIUM  8.5*   --    --   8.6*  ,     Assessment and Plan: Bilateral pleural effusion, right larger the the left.   hx of end stage renal failure on dialysis. Small right effusion that layer out,  Will follow effusion, to small tap safely Take extra fluid off with dialysis  Copd, stable Continue present regimen    I have personally obtained a history, examined the patient, evaluated laboratory and imaging results, formulated the assessment and plan and placed orders.  The Patient requires high complexity decision making for assessment and support, frequent evaluation and titration of therapies, application of advanced monitoring technologies and extensive interpretation of multiple databases.   Arelie Kuzel,M.D. Pulmonary & Critical care Medicine [facility]

## 2015-06-08 NOTE — Care Management Note (Signed)
Patient is active at [facility] on TTS 1st shift schedule. I have sent admission records and will forward additional records at discharge.  [facility] Liaison  [phone]

## 2015-06-08 NOTE — Progress Notes (Signed)
Subjective:   Patient seen during dialysis Tolerating well  Hgb low again  Objective:  Vital signs in last 24 hours:  Temp:  [97.5 F (36.4 C)-97.8 F (36.6 C)] 97.5 F (36.4 C) (09/15 0444) Pulse Rate:  [71-75] 75 (09/15 0444) Resp:  [18-20] 18 (09/15 0444) BP: (110-132)/(61-82) 132/82 mmHg (09/15 0444) SpO2:  [95 %-98 %] 97 % (09/15 0444)  Weight change:  Filed Weights   06/06/15 0648 06/06/15 1500 06/06/15 1815  Weight: 92.08 kg (203 lb) 93.4 kg (205 lb 14.6 oz) 91.8 kg (202 lb 6.1 oz)    Intake/Output:    Intake/Output Summary (Last 24 hours) at 06/08/15 1141 Last data filed at 06/08/15 0900  Gross per 24 hour  Intake    240 ml  Output      0 ml  Net    240 ml     Physical Exam: General: NAD, laying in bed  HEENT anicteric  Neck supple  Pulm/lungs normal effort, clear  CVS/Heart Irregular, no rub  Abdomen:  Soft, non distended, non tender  Extremities: Trace edema  Neurologic: Alert, oriented  Skin: No acute rashes  Access: AVF       Basic Metabolic Panel:   Recent Labs Lab 06/06/15 0727 06/08/15 0520  NA 142  --   K 4.1  --   CL 100*  --   CO2 29  --   GLUCOSE 125*  --   BUN 40*  --   CREATININE 4.59*  --   CALCIUM 8.5*  --   PHOS  --  2.7     CBC:  Recent Labs Lab 06/06/15 0727 06/07/15 1133 06/08/15 0520  WBC 13.3*  --  9.3  NEUTROABS  --   --  8.2*  HGB 6.4* 7.9* 6.7*  HCT 20.5* 24.2* 20.2*  MCV 92.4  --  89.5  PLT 287  --  218      Microbiology:  Recent Results (from the past 720 hour(s))  MRSA PCR Screening     Status: None   Collection Time: 06/07/15  7:46 PM  Result Value Ref Range Status   MRSA by PCR NEGATIVE NEGATIVE Final    Comment:        The GeneXpert MRSA Assay (FDA approved for NASAL specimens only), is one component of a comprehensive MRSA colonization surveillance program. It is not intended to diagnose MRSA infection nor to guide or monitor treatment for MRSA infections.     Coagulation  Studies: No results for input(s): LABPROT, INR in the last 72 hours.  Urinalysis: No results for input(s): COLORURINE, LABSPEC, PHURINE, GLUCOSEU, HGBUR, BILIRUBINUR, KETONESUR, PROTEINUR, UROBILINOGEN, NITRITE, LEUKOCYTESUR in the last 72 hours.  Invalid input(s): APPERANCEUR    Imaging: Dg Chest Right Decubitus  06/07/2015   CLINICAL DATA:  Followup pleural effusion.  Short breath.  EXAM: CHEST - RIGHT DECUBITUS  COMPARISON:  Chest CT, 06/06/2015  FINDINGS: There is a small layering right pleural effusion. Additional dependent opacity in the right mid and lower lung is likely atelectasis.  IMPRESSION: Small layering right pleural effusion. Associated mid to lower lung zone parenchymal opacity most likely atelectasis.   Electronically Signed   By: Lajean Manes M.D.   On: 06/07/2015 14:47     Medications:     . sodium chloride   Intravenous Once  . amiodarone  200 mg Oral Daily  . amLODipine  2.5 mg Oral Daily  . atorvastatin  40 mg Oral Daily  . budesonide-formoterol  2 puff Inhalation  BID  . feeding supplement (NEPRO CARB STEADY)  237 mL Oral BID BM  . ferrous sulfate  325 mg Oral Q breakfast  . guaiFENesin  600 mg Oral BID  . [START ON 06/09/2015] levofloxacin (LEVAQUIN) IV  500 mg Intravenous Q48H  . magnesium oxide  400 mg Oral QPM  . methylPREDNISolone (SOLU-MEDROL) injection  40 mg Intravenous Q12H  . pantoprazole  40 mg Oral BID  . polyethylene glycol  17 g Oral Daily  . sevelamer carbonate  800 mg Oral TID AC  . sodium chloride  3 mL Intravenous Q12H  . tiotropium  18 mcg Inhalation Daily   acetaminophen **OR** acetaminophen, albuterol, alum & mag hydroxide-simeth, bismuth subsalicylate, HYDROcodone-acetaminophen, ipratropium-albuterol, nicotine, ondansetron **OR** ondansetron (ZOFRAN) IV, senna-docusate  Assessment/ Plan:  74 y.o. 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 ESRD. [address]  dialysis. T-T-S Dialysis today Blood transfusion with dialysis 2. Anemia of chronic kidney disease. - Procrit is on hold due to bladder cancer -  hematology evaluation for anemia in progress - Ordered labs with dialysis as requested by hematology 3. Secondary hyperparathyroidism - Monitor phosphorus this admission   LOS: 2 Mauriana Dann 9/15/201611:41 AM

## 2015-06-08 NOTE — Plan of Care (Signed)
Problem: Discharge Progression Outcomes Goal: Discharge plan in place and appropriate Outcome: Progressing Individualization of Care Pt prefers to be called [name] Hx of paroxysmal atrial fibrillation, CHF, ESRD, anemia, small bowel obstruction, sepsis, GERD, COPD, hypercholesteremia, magnesium deficiency, dementia, Iliac aneurysm, incontinence, hydronephrosis, overactive bladder, stroke, bladder and prostate cancer, HTN, renal insufficiency. Patient was positive for MRSA in nares on last admission less than 30 days ago. He remains on MRSA precautions.   Goal: Other Discharge Outcomes/Goals 1. Pain: Patient without complaints of pain this shift. 2. Hemodynamically: Patient afebrile and VSS throughout shift. BP 121/67 mmHg  Pulse 71  Temp(Src) 97.6 F (36.4 C) (Oral)  Resp 20  Ht 6\' 2"  (1.88 m)  Wt 202 lb 6.1 oz (91.8 kg)  BMI 25.97 kg/m2  SpO2 96% 3. Complications: Patient had complaints of SOB. PRN Nebulizer treatment x 1 this shift with SOB resolved. 4. Diet: Patient on renal/fluid restricted diet. Patient did not eat this shift but is tolerating fluid restriction. 5. Activity: Patient on bedrest this shift.  He was in chair during day shift and tolerated well.

## 2015-06-08 NOTE — Progress Notes (Signed)
[facility] NAME: [name]    MR#:  [id number]  DATE OF BIRTH:  01/02/1941  SUBJECTIVE:  CHIEF COMPLAINT:  Patient feels okay  has some weakness  REVIEW OF SYSTEMS:  CONSTITUTIONAL: No fever, positive fatigue or weakness.  EYES: No blurred or double vision.  EARS, NOSE, AND THROAT: No tinnitus or ear pain.  RESPIRATORY: No cough, shortness of breath, wheezing or hemoptysis.  CARDIOVASCULAR: No chest pain, orthopnea, edema.  GASTROINTESTINAL: No nausea, vomiting, diarrhea or abdominal pain.  GENITOURINARY: No dysuria, hematuria.  ENDOCRINE: No polyuria, nocturia,  HEMATOLOGY: No anemia, easy bruising or bleeding SKIN: No rash or lesion. MUSCULOSKELETAL: No joint pain or arthritis.   NEUROLOGIC: No tingling, numbness, weakness.  PSYCHIATRY: No anxiety or depression.   DRUG ALLERGIES:   Allergies  Allergen Reactions  . Penicillins Hives, Itching and Swelling    VITALS:  Blood pressure 132/82, pulse 75, temperature 97.8 F (36.6 C), temperature source Oral, resp. rate 18, height 6\' 2"  (1.88 m), weight 91.8 kg (202 lb 6.1 oz), SpO2 97 %.  PHYSICAL EXAMINATION:  GENERAL:  74 y.o.-year-old patient lying in the bed with no acute distress.  EYES: Pupils equal, round, reactive to light and accommodation. No scleral icterus. Extraocular muscles intact.  HEENT: Head atraumatic, normocephalic. Oropharynx and nasopharynx clear.  NECK:  Supple, no jugular venous distention. No thyroid enlargement, no tenderness.  LUNGS: Decreased breath sounds on right side, minimal expiratory wheezing, rales,rhonchi or crepitation. No use of accessory muscles of respiration.  CARDIOVASCULAR: S1, S2 normal. No murmurs, rubs, or gallops.  ABDOMEN: Soft, nontender, nondistended. Bowel sounds present. No organomegaly or mass.  EXTREMITIES: No pedal edema, cyanosis, or clubbing.  NEUROLOGIC: Cranial nerves II through XII are intact. Muscle  strength 5/5 in all extremities. Sensation intact PSYCHIATRIC: The patient is alert and oriented x 3.  SKIN: No obvious rash, lesion, or ulcer.    LABORATORY PANEL:   CBC  Recent Labs Lab 06/08/15 0520  WBC 9.3  HGB 6.7*  HCT 20.2*  PLT 218   ------------------------------------------------------------------------------------------------------------------  Chemistries   Recent Labs Lab 06/06/15 0727 06/08/15 1135  NA 142 138  K 4.1 4.9  CL 100* 96*  CO2 29 28  GLUCOSE 125* 156*  BUN 40* 55*  CREATININE 4.59* 4.45*  CALCIUM 8.5* 8.6*  AST 21  --   ALT 8*  --   ALKPHOS 82  --   BILITOT 1.0  --    ------------------------------------------------------------------------------------------------------------------  Cardiac Enzymes  Recent Labs Lab 06/07/15 1133  TROPONINI 0.03   ------------------------------------------------------------------------------------------------------------------  RADIOLOGY:  Dg Chest Right Decubitus  06/07/2015   CLINICAL DATA:  Followup pleural effusion.  Short breath.  EXAM: CHEST - RIGHT DECUBITUS  COMPARISON:  Chest CT, 06/06/2015  FINDINGS: There is a small layering right pleural effusion. Additional dependent opacity in the right mid and lower lung is likely atelectasis.  IMPRESSION: Small layering right pleural effusion. Associated mid to lower lung zone parenchymal opacity most likely atelectasis.   Electronically Signed   By: Lajean Manes M.D.   On: 06/07/2015 14:47    EKG:   Orders placed or performed during the hospital encounter of 06/06/15  . EKG 12-Lead  . EKG 12-Lead    ASSESSMENT AND PLAN:   Severe pleasant 74 year old male with end-stage renal disease on hemodialysis, anemia of chronic disease and COPD with 2 L of oxygen at night presents with shortness of breath and found to have anemia and  possible COPD exacerbation.  1. Symptomatic anemia: Patient was admitted about a month ago with similar issue.  He had a  colonoscopy in 2012 which did not show evidence of malignancy.  s/p 1 unit of PRBC.   Hemolysis panel Transfuse 1 more unit of blood No evidence of acute GI blood loss  2. Presyncope: I suspect this is from his symptomatic anemia and possibly his shortness of breath.  check orthostatic vital signs, transfused 1 unit of PRBC. Telemetry monitoring will also be continued.  3. Right pleural effusion: This is potentially partially loculated. Appreciate pulmonary input 4.. End-stage renal disease on hemodialysis:  Had hemodialysis yesterday. Follow-up with nephrology nephrology.   5. Acute exacerbation of COPD:continue DuoNeb's and start IV steroids for mild COPD exacerbation.  6. Hyperlipidemia: Continue Lipitor  7. History of PAF: Continue amiodarone.  8. Essential hypertension: Continue Norvasc.     All the records are reviewed and case discussed with Care Management/Social Workerr. Management plans discussed with the patient, family and they are in agreement.  CODE STATUS: Full code  TOTAL TIME TAKING CARE OF THIS PATIENT: 35 minutes.      Dustin Flock M.D on 06/08/2015 at 1:31 PM  Between 7am to 6pm - Pager - [phone] After 6pm go to [email or web address] - password [email or web address]  Office  [phone]  CC: Primary care physician; Dion Body, MD

## 2015-06-08 NOTE — Progress Notes (Signed)
PT Cancellation Note  Patient Details Name: JAXIN FULFER MRN: [id number] DOB: Jan 28, 1941   Cancelled Treatment:    Reason Eval/Treat Not Completed: Medical issues which prohibited therapy (Pt Hg 6.7 (PT contraindicated <7.1).)  Will hold PT at this time and re-attempt PT at a later date/time as medically appropriate.   Raquel Sarna Almena Hokenson 06/08/2015, 1:18 PM Leitha Bleak, [place]

## 2015-06-08 NOTE — Plan of Care (Signed)
Problem: Discharge Progression Outcomes Goal: Discharge plan in place and appropriate Outcome: Progressing Plan to go home with home health Goal: Pain controlled with appropriate interventions Outcome: Not Applicable Date Met:  05/26/00 Denies pain Goal: Hemodynamically stable Outcome: Not Progressing hgb 6.7 today. Received 1 unit of pcs in dialysis today Goal: Complications resolved/controlled Outcome: Progressing Dialysis today for 4 hours.  tol well 1 liter  Taken off. Rested quietly back in romm w/o c/o. 02 in use 2 l [place] Goal: Tolerating diet Outcome: Progressing tol 100% of  diet

## 2015-06-09 ENCOUNTER — Inpatient Hospital Stay: Payer: Medicare Other

## 2015-06-09 ENCOUNTER — Encounter: Payer: Self-pay | Admitting: Radiology

## 2015-06-09 LAB — TYPE AND SCREEN
ABO/RH(D): A NEG
Antibody Screen: NEGATIVE
UNIT DIVISION: 0
Unit division: 0

## 2015-06-09 LAB — OCCULT BLOOD X 1 CARD TO LAB, STOOL: FECAL OCCULT BLD: NEGATIVE

## 2015-06-09 LAB — CBC WITH DIFFERENTIAL/PLATELET
BASOS ABS: 0 10*3/uL (ref 0–0.1)
BASOS PCT: 0 %
EOS PCT: 2 %
Eosinophils Absolute: 0.2 10*3/uL (ref 0–0.7)
HCT: 24.4 % — ABNORMAL LOW (ref 40.0–52.0)
Hemoglobin: 8 g/dL — ABNORMAL LOW (ref 13.0–18.0)
LYMPHS PCT: 20 %
Lymphs Abs: 2.1 10*3/uL (ref 1.0–3.6)
MCH: 29.6 pg (ref 26.0–34.0)
MCHC: 32.8 g/dL (ref 32.0–36.0)
MCV: 90.3 fL (ref 80.0–100.0)
Monocytes Absolute: 0.8 10*3/uL (ref 0.2–1.0)
Monocytes Relative: 8 %
Neutro Abs: 7.5 10*3/uL — ABNORMAL HIGH (ref 1.4–6.5)
Neutrophils Relative %: 70 %
PLATELETS: 170 10*3/uL (ref 150–440)
RBC: 2.7 MIL/uL — AB (ref 4.40–5.90)
RDW: 17 % — ABNORMAL HIGH (ref 11.5–14.5)
WBC: 10.6 10*3/uL (ref 3.8–10.6)

## 2015-06-09 LAB — PSA: PSA: 1.44 ng/mL (ref 0.00–4.00)

## 2015-06-09 LAB — HAPTOGLOBIN: Haptoglobin: 225 mg/dL — ABNORMAL HIGH (ref 34–200)

## 2015-06-09 LAB — FOLATE RBC
Folate, Hemolysate: 439.7 ng/mL
Folate, RBC: 2199 ng/mL (ref >498)
HEMATOCRIT: 20 % — AB (ref 37.5–51.0)

## 2015-06-09 LAB — ANA W/REFLEX IF POSITIVE: ANA: NEGATIVE

## 2015-06-09 MED ORDER — LACTULOSE 10 GM/15ML PO SOLN: 30.0000 g | Freq: Every day | ORAL | Status: DC | PRN

## 2015-06-09 MED ORDER — POLYETHYLENE GLYCOL 3350 17 G PO PACK: 17.0000 g | PACK | Freq: Every day | ORAL | Status: DC | PRN

## 2015-06-09 MED ORDER — DOCUSATE SODIUM 100 MG PO CAPS
200.0000 mg | ORAL_CAPSULE | Freq: Two times a day (BID) | ORAL | Status: DC
  Administered 2015-06-09: 14:00:00 200 mg via ORAL
  Filled 2015-06-09 (×2): qty 2

## 2015-06-09 MED ORDER — IOHEXOL 350 MG/ML SOLN
100.0000 mL | Freq: Once | INTRAVENOUS | Status: AC | PRN
  Administered 2015-06-09: 100 mL via INTRAVENOUS

## 2015-06-09 NOTE — Consult Note (Signed)
Consultation  Referring Provider: Dr. Posey Pronto Primary Care Physician:  Dion Body, MD Consulting  Gastroenterologist:   Dr. Gaylyn Cheers      Reason for Consultation: Anemia            HPI:   [name] is a 74 y.o. male  with a known history of COPD on 2 L of oxygen at night, combined systolic EF 47-09% and diastolic CHF, paroxymal Atrial Fib, ESRD on hemodialysis, prostate cancer in remission, anemia of chronic disease, small bowel obstruction who presents with dizziness, lightheadedness, near syncopal episode, shortness of breath on 06/06/2015.  He was found to have a lower than normal Hgb and iron deficient.  His baseline hemoglobin is 7.5-8.0  Range and  he came in with hemoglobin of 6.4.  He received 1 unit PRBC yesterday and hemoglobin has increased appropriately. He reports dark stools for a long time. He is reporting constipation and feels bloated and uncomfortable in the abdomen. He has been given a laxative and is requesting an enema.   He denies weight loss, nausea vomiting or any upper GI complaints.  He underwent a colonoscopy for change in bowel habits and diarrhea on 03/18/2011 with poor colon bowel prep, diverticulosis, the colon mucosa was normal.  Biopsies were negative.  He was last seen in the [facility] GI outpatient clinic for diarrhea 05/01/2015.  He ws found to be a poor sedation candidate for repeat luminal evaluation. He was advised to take Metamucil.  Past Medical History  Diagnosis Date  . PAF (paroxysmal atrial fibrillation)     a. new onset 12/2014 in the setting of UTI, sepsis, hypotension, and anemia; b. not on long term anticoagulation given anemia; c. family aware of stroke risk, they are ok with this; d. on amiodarone   . Chronic combined systolic and diastolic CHF (congestive heart failure)     a. echo 12/2014: EF 25-30%, anterior wall wall motion abnormalities; b. planned ischemic evaluation once patient is stable medically; c. echo 01/2015: EF 45-50%, no  RWMA, mild AT, LA mildly dilated, mod pericardial effusion along LV free wall, no evidence of hemodynamic compromise  . ESRD on hemodialysis     a. Tuesday, Thursday, and Saturdays  . Anemia     a. baseline hgb ~ 8  . History of small bowel obstruction     a. 01/2015  . History of septic shock     a. 01/2015  . GERD (gastroesophageal reflux disease)   . Allergic rhinitis   . COPD (chronic obstructive pulmonary disease)   . Hypercholesteremia   . Cognitive communication deficit   . Magnesium deficiency   . Dementia   . Iliac aneurysm   . Incontinence   . Hydronephrosis   . Overactive bladder   . Detrusor sphincter dyssynergia   . Mini stroke   . Bladder cancer   . Prostate cancer   . Renal insufficiency   . Hypertension     Past Surgical History  Procedure Laterality Date  . Cystoscopy w/ ureteral stent placement    . Transurethral resection of bladder tumor with gyrus (turbt-gyrus)    . Ureteral stent placement    . Prostate surgery      Removal  . Av fistula placement      left arm    Family History  Problem Relation Age of Onset  . Stroke Mother      Social History  Substance Use Topics  . Smoking status: Former Smoker -- 0.25 packs/day for 56  years    Quit date: 05/13/2015  . Smokeless tobacco: Never Used  . Alcohol Use: No    Prior to Admission medications   Medication Sig Start Date End Date Taking? Authorizing Provider  albuterol (PROVENTIL HFA;VENTOLIN HFA) 108 (90 BASE) MCG/ACT inhaler Inhale 2 puffs into the lungs every 4 (four) hours as needed for wheezing or shortness of breath.   Yes Historical Provider, MD  amiodarone (PACERONE) 200 MG tablet Take 1 tablet (200 mg total) by mouth daily. 02/27/15  Yes Wellington Hampshire, MD  amLODipine (NORVASC) 2.5 MG tablet Take 2.5 mg by mouth daily.   Yes Historical Provider, MD  atorvastatin (LIPITOR) 40 MG tablet Take 1 tablet (40 mg total) by mouth daily. 02/03/15  Yes Fritzi Mandes, MD  budesonide-formoterol  (SYMBICORT) 160-4.5 MCG/ACT inhaler Inhale 2 puffs into the lungs 2 (two) times daily.   Yes Historical Provider, MD  ferrous sulfate 325 (65 FE) MG tablet Take 1 tablet (325 mg total) by mouth daily with breakfast. 03/27/15  Yes Srikar Sudini, MD  guaiFENesin (MUCINEX) 600 MG 12 hr tablet Take 1 tablet (600 mg total) by mouth 2 (two) times daily. 05/08/15  Yes Theodoro Grist, MD  magnesium oxide (MAG-OX) 400 MG tablet Take 400 mg by mouth every evening.    Yes Historical Provider, MD  pantoprazole (PROTONIX) 40 MG tablet Take 1 tablet (40 mg total) by mouth 2 (two) times daily. 05/08/15  Yes Theodoro Grist, MD  sevelamer carbonate (RENVELA) 800 MG tablet Take 800 mg by mouth 3 (three) times daily before meals.    Yes Historical Provider, MD  tiotropium (SPIRIVA) 18 MCG inhalation capsule Place 1 capsule (18 mcg total) into inhaler and inhale daily. 02/17/15  Yes Dustin Flock, MD  chlorpheniramine-HYDROcodone (TUSSIONEX) 10-8 MG/5ML SUER Take 5 mLs by mouth every 12 (twelve) hours. 05/08/15   Theodoro Grist, MD  levofloxacin (LEVAQUIN) 500 MG tablet Take 1 tablet by mouth every other day. 05/30/15   Historical Provider, MD  nicotine (NICOTROL) 10 MG inhaler Inhale 1 cartridge (1 continuous puffing total) into the lungs as needed for smoking cessation. 05/08/15   Theodoro Grist, MD  Nutritional Supplements (FEEDING SUPPLEMENT, NEPRO CARB STEADY,) LIQD Take 237 mLs by mouth 2 (two) times daily between meals. Patient not taking: Reported on 05/04/2015 02/03/15   Fritzi Mandes, MD  polyethylene glycol [facility] / GLYCOLAX) packet Take 17 g by mouth daily. Patient not taking: Reported on 04/10/2015 03/27/15   Hillary Bow, MD    Current Facility-Administered Medications  Medication Dose Route Frequency Provider Last Rate Last Dose  . 0.9 %  sodium chloride infusion   Intravenous Once Dustin Flock, MD      . acetaminophen (TYLENOL) tablet 650 mg  650 mg Oral Q6H PRN Bettey Costa, MD       Or  . acetaminophen (TYLENOL)  suppository 650 mg  650 mg Rectal Q6H PRN Sital Mody, MD      . albuterol (PROVENTIL) (2.5 MG/3ML) 0.083% nebulizer solution 2.5 mg  2.5 mg Nebulization Q4H PRN Bettey Costa, MD      . alum & mag hydroxide-simeth (MAALOX/MYLANTA) 200-200-20 MG/5ML suspension 30 mL  30 mL Oral Q6H PRN Bettey Costa, MD      . amiodarone (PACERONE) tablet 200 mg  200 mg Oral Daily Bettey Costa, MD   200 mg at 06/09/15 1111  . amLODipine (NORVASC) tablet 2.5 mg  2.5 mg Oral Daily Bettey Costa, MD   2.5 mg at 06/09/15 1111  . atorvastatin (LIPITOR)  tablet 40 mg  40 mg Oral Daily Bettey Costa, MD   40 mg at 06/09/15 1421  . bismuth subsalicylate (PEPTO BISMOL) 262 MG/15ML suspension 30 mL  30 mL Oral Q4H PRN Murlean Iba, MD      . budesonide-formoterol (SYMBICORT) 160-4.5 MCG/ACT inhaler 2 puff  2 puff Inhalation BID Bettey Costa, MD   2 puff at 06/09/15 1112  . docusate sodium (COLACE) capsule 200 mg  200 mg Oral BID Dustin Flock, MD   200 mg at 06/09/15 1421  . feeding supplement (NEPRO CARB STEADY) liquid 237 mL  237 mL Oral BID BM Sital Mody, MD   237 mL at 06/09/15 1400  . ferrous sulfate tablet 325 mg  325 mg Oral Q breakfast Bettey Costa, MD   325 mg at 06/09/15 1111  . guaiFENesin (MUCINEX) 12 hr tablet 600 mg  600 mg Oral BID Bettey Costa, MD   600 mg at 06/09/15 1111  . HYDROcodone-acetaminophen (NORCO/VICODIN) 5-325 MG per tablet 1-2 tablet  1-2 tablet Oral Q4H PRN Sital Mody, MD      . ipratropium-albuterol (DUONEB) 0.5-2.5 (3) MG/3ML nebulizer solution 3 mL  3 mL Nebulization Q4H PRN Nicholes Mango, MD   3 mL at 06/08/15 0237  . lactulose (CHRONULAC) 10 GM/15ML solution 30 g  30 g Oral Daily PRN Murlean Iba, MD      . levofloxacin (LEVAQUIN) tablet 500 mg  500 mg Oral Q48H Dustin Flock, MD   500 mg at 06/09/15 1422  . magnesium oxide (MAG-OX) tablet 400 mg  400 mg Oral QPM Bettey Costa, MD   400 mg at 06/08/15 1856  . nicotine (NICOTROL) 10 MG inhaler 1 continuous puffing  1 continuous puffing Inhalation PRN Bettey Costa,  MD      . ondansetron (ZOFRAN) tablet 4 mg  4 mg Oral Q6H PRN Bettey Costa, MD       Or  . ondansetron (ZOFRAN) injection 4 mg  4 mg Intravenous Q6H PRN Sital Mody, MD      . pantoprazole (PROTONIX) EC tablet 40 mg  40 mg Oral BID Bettey Costa, MD   40 mg at 06/09/15 1111  . polyethylene glycol (MIRALAX / GLYCOLAX) packet 17 g  17 g Oral Daily Bettey Costa, MD   17 g at 06/09/15 1112  . polyethylene glycol (MIRALAX / GLYCOLAX) packet 17 g  17 g Oral Daily PRN Murlean Iba, MD      . predniSONE (DELTASONE) tablet 60 mg  60 mg Oral Q breakfast Dustin Flock, MD   60 mg at 06/09/15 1111  . senna-docusate (Senokot-S) tablet 1 tablet  1 tablet Oral QHS PRN Bettey Costa, MD      . sevelamer carbonate (RENVELA) tablet 800 mg  800 mg Oral TID AC Bettey Costa, MD   800 mg at 06/09/15 1111  . sodium chloride 0.9 % injection 3 mL  3 mL Intravenous Q12H Bettey Costa, MD   3 mL at 06/09/15 1422  . tiotropium (SPIRIVA) inhalation capsule 18 mcg  18 mcg Inhalation Daily Bettey Costa, MD   18 mcg at 06/09/15 1112    Allergies as of 06/06/2015 - Review Complete 06/06/2015  Allergen Reaction Noted  . Penicillins Hives, Itching, and Swelling 01/20/2015     Review of Systems:    Positive for fatigue, uses wheelchair.  Positive continue shortness of breath and wheezing.  He has a history of abnormal CTA and is followed by Dr. Delana Meyer.  Remaining 12 systems otherwise negative with exception as  noted history present illness.   Physical Exam:  Vital signs in last 24 hours: Temp:  [97.5 F (36.4 C)-98.7 F (37.1 C)] 97.6 F (36.4 C) (09/16 1305) Pulse Rate:  [66-72] 70 (09/16 1305) Resp:  [18] 18 (09/16 1305) BP: (114-118)/(61-80) 118/73 mmHg (09/16 1305) SpO2:  [99 %] 99 % (09/16 1305) Last BM Date: 06/06/15  General:  Well-developed, well-nourished, AA male sitting in chair.  Head:  Head without obvious abnormality, atraumatic  Eyes:   Conjunctiva pink, sclera anicteric   ENT:   Mouth free of lesions, mucosa  moist,   Voice quality is hoarseness.  He reports this is chronic with history of vocal cord polyps.  Neck:   Supple w/o thyromegaly or mass, trachea midline, no adenopathy  Lungs: Clear to auscultation bilaterally, respirations unlabored Heart:     Normal S1S2, no rubs, murmurs, gallops. Abdomen: Soft, mildly uncomfortable with palpation, no focal tenderness.  Slightly distended consistent with his report of constipation.  Bowel sounds are present.  Rectal: Deferred Lymph:  No cervical or supraclavicular adenopathy. Extremities:   No edema, cyanosis, or clubbing, Bruit present left arm shunt Skin  Skin color, texture, turgor normal, no rashes or lesions Neuro:  A&O x 3. CNII-XII intact, normal strength Psych:  Appropriate mood and affect.Cheerful  Data Reviewed:  LAB RESULTS:  Recent Labs  06/08/15 0520 06/08/15 1300 06/09/15 0840  WBC 9.3  --  10.6  HGB 6.7*  --  8.0*  HCT 20.2* 20.0* 24.4*  PLT 218  --  170   BMET  Recent Labs  06/08/15 1135  NA 138  K 4.9  CL 96*  CO2 28  GLUCOSE 156*  BUN 55*  CREATININE 4.45*  CALCIUM 8.6*   LFT  Recent Labs  06/08/15 1135  ALBUMIN 2.8*   PT/INR No results for input(s): LABPROT, INR in the last 72 hours.  STUDIES: No results found.   Assessment:  [name] is a 74 y.o.   Presents with acute on chronic anemia with history of renew chronic renal disease, iron deficiency, multiple co morbidities.  He was feeling lightheaded and weak and was found to have a drift in hemoglobin.  He has received 1 unit PRBC and hemoglobin has improved.  He reports constipation.   No evidence of active GI bleed at this time.   Plan:  Recommend Hemoccult cards documented GI blood loss.No recommendation for luminal evaluation at this time. His last colonoscopy was unremarkable 4 years ago.  If he is heme positive, then we will need a consider his candidacy for upper endoscopy initially.  Treat constipation as needed.  Iron  supplementation as needed.   This case was discussed with Dr. Manya Silvas in collaboration of care. Thank you for the consultation.  These services provided by Denice Paradise RN, MSN, ANP-BC under collaborative practice agreement with Manya Silvas, MD.  06/09/2015, 3:32 PM

## 2015-06-09 NOTE — Progress Notes (Signed)
PT Cancellation Note  Patient Details Name: [name] MRN: [id number] DOB: 03-21-1941   Cancelled Treatment:    Reason Eval/Treat Not Completed: Patient at procedure or test/unavailable (Pt off floor for imaging.)  Will re-attempt PT treatment session at a later date/time as able.   Raquel Sarna Hauschild 06/09/2015, 3:57 PM Leitha Bleak, [place]

## 2015-06-09 NOTE — Progress Notes (Addendum)
[facility] NAME: [name]    MR#:  [id number]  DATE OF BIRTH:  03/12/1941  SUBJECTIVE:  CHIEF COMPLAINT:  Patient received 1 unit of packed RBCs yesterday hemoglobin is appropriately up, no evidence of hemolysis based on his blood work  REVIEW OF SYSTEMS:  CONSTITUTIONAL: No fever, positive fatigue or weakness.  EYES: No blurred or double vision.  EARS, NOSE, AND THROAT: No tinnitus or ear pain.  RESPIRATORY: No cough, shortness of breath, wheezing or hemoptysis.  CARDIOVASCULAR: No chest pain, orthopnea, edema.  GASTROINTESTINAL: No nausea, vomiting, diarrhea or abdominal pain.  GENITOURINARY: No dysuria, hematuria.  ENDOCRINE: No polyuria, nocturia,  HEMATOLOGY: No anemia, easy bruising or bleeding SKIN: No rash or lesion. MUSCULOSKELETAL: No joint pain or arthritis.   NEUROLOGIC: No tingling, numbness, weakness.  PSYCHIATRY: No anxiety or depression.   DRUG ALLERGIES:   Allergies  Allergen Reactions  . Penicillins Hives, Itching and Swelling    VITALS:  Blood pressure 116/80, pulse 66, temperature 98.7 F (37.1 C), temperature source Oral, resp. rate 18, height 6\' 2"  (1.88 m), weight 91.8 kg (202 lb 6.1 oz), SpO2 99 %.  PHYSICAL EXAMINATION:  GENERAL:  74 y.o.-year-old patient lying in the bed with no acute distress.  EYES: Pupils equal, round, reactive to light and accommodation. No scleral icterus. Extraocular muscles intact.  HEENT: Head atraumatic, normocephalic. Oropharynx and nasopharynx clear.  NECK:  Supple, no jugular venous distention. No thyroid enlargement, no tenderness.  LUNGS: Decreased breath sounds on right side, minimal expiratory wheezing, rales,rhonchi or crepitation. No use of accessory muscles of respiration.  CARDIOVASCULAR: S1, S2 normal. No murmurs, rubs, or gallops.  ABDOMEN: Soft, nontender, nondistended. Bowel sounds present. No organomegaly or mass.  EXTREMITIES: No pedal  edema, cyanosis, or clubbing.  NEUROLOGIC: Cranial nerves II through XII are intact. Muscle strength 5/5 in all extremities. Sensation intact PSYCHIATRIC: The patient is alert and oriented x 3.  SKIN: No obvious rash, lesion, or ulcer.    LABORATORY PANEL:   CBC  Recent Labs Lab 06/09/15 0840  WBC 10.6  HGB 8.0*  HCT 24.4*  PLT 170   ------------------------------------------------------------------------------------------------------------------  Chemistries   Recent Labs Lab 06/06/15 0727 06/08/15 1135  NA 142 138  K 4.1 4.9  CL 100* 96*  CO2 29 28  GLUCOSE 125* 156*  BUN 40* 55*  CREATININE 4.59* 4.45*  CALCIUM 8.5* 8.6*  AST 21  --   ALT 8*  --   ALKPHOS 82  --   BILITOT 1.0  --    ------------------------------------------------------------------------------------------------------------------  Cardiac Enzymes  Recent Labs Lab 06/07/15 1133  TROPONINI 0.03   ------------------------------------------------------------------------------------------------------------------  RADIOLOGY:  Dg Chest Right Decubitus  06/07/2015   CLINICAL DATA:  Followup pleural effusion.  Short breath.  EXAM: CHEST - RIGHT DECUBITUS  COMPARISON:  Chest CT, 06/06/2015  FINDINGS: There is a small layering right pleural effusion. Additional dependent opacity in the right mid and lower lung is likely atelectasis.  IMPRESSION: Small layering right pleural effusion. Associated mid to lower lung zone parenchymal opacity most likely atelectasis.   Electronically Signed   By: Lajean Manes M.D.   On: 06/07/2015 14:47    EKG:   Orders placed or performed during the hospital encounter of 06/06/15  . EKG 12-Lead  . EKG 12-Lead    ASSESSMENT AND PLAN:   Severe pleasant 74 year old male with end-stage renal disease on hemodialysis, anemia of chronic disease and COPD with 2 L of  oxygen at night presents with shortness of breath and found to have anemia and possible COPD  exacerbation.  1. Symptomatic anemia: Patient was admitted about a month ago with similar issue.  He had a colonoscopy in 2012 which did not show evidence of malignancy.  s/p 2 unit of PRBC.   Hemolysis panel negative GI consult Outpatient hematology follow-up History of endoleak questionable causing the anemia CT angiogram of the abdomen  2. Presyncope:  this is from his symptomatic anemia and possibly his shortness of breath.   transfused 2 unit of PRBC.  3. Right pleural effusion: This is potentially partially loculated. Appreciate pulmonary input too small to drain 4.. End-stage renal disease on hemodialysis:  Had hemodialysis yesterday. Follow-up with nephrology nephrology.   5. Acute exacerbation of COPD:continue DuoNeb's , prednisone taper  6. hyperlipdemia: Continue Lipitor  7. History of PAF: Continue amiodarone.  8. Essential hypertension: Continue Norvasc.     All the records are reviewed and case discussed with Care Management/Social Workerr. Management plans discussed with the patient, family and they are in agreement.  CODE STATUS: Full code  TOTAL TIME TAKING CARE OF THIS PATIENT: 73minutes.     If hemoglobin stable tomorrow no further GI workup planned then he can discharge tomorrow  Dustin Flock M.D on 06/09/2015 at 11:51 AM  Between 7am to 6pm - Pager - [phone] After 6pm go to [email or web address] - password [email or web address] [facility]  Office  [phone]  CC: Primary care physician; Dion Body, MD

## 2015-06-09 NOTE — Consult Note (Signed)
ANTIBIOTIC CONSULT NOTE - INITIAL  Pharmacy Consult for levaquin Indication: pneumonia HCAP  Allergies  Allergen Reactions  . Penicillins Hives, Itching and Swelling    Patient Measurements: Height: 6\' 2"  (188 cm) Weight: 202 lb 6.1 oz (91.8 kg) IBW/kg (Calculated) : 82.2 Adjusted Body Weight:   Vital Signs: Temp: 98.7 F (37.1 C) (09/16 0451) Temp Source: Oral (09/15 2136) BP: 116/80 mmHg (09/16 0451) Pulse Rate: 66 (09/16 0451) Intake/Output from previous day: 09/15 0701 - 09/16 0700 In: 480 [P.O.:480] Out: 966  Intake/Output from this shift:    Labs:  Recent Labs  06/07/15 1133 06/08/15 0520 06/08/15 1135  WBC  --  9.3  --   HGB 7.9* 6.7*  --   PLT  --  218  --   CREATININE  --   --  4.45*   Estimated Creatinine Clearance: 17.2 mL/min (by C-G formula based on Cr of 4.45). No results for input(s): VANCOTROUGH, VANCOPEAK, VANCORANDOM, GENTTROUGH, GENTPEAK, GENTRANDOM, TOBRATROUGH, TOBRAPEAK, TOBRARND, AMIKACINPEAK, AMIKACINTROU, AMIKACIN in the last 72 hours.   Microbiology: Recent Results (from the past 720 hour(s))  MRSA PCR Screening     Status: None   Collection Time: 06/07/15  7:46 PM  Result Value Ref Range Status   MRSA by PCR NEGATIVE NEGATIVE Final    Comment:        The GeneXpert MRSA Assay (FDA approved for NASAL specimens only), is one component of a comprehensive MRSA colonization surveillance program. It is not intended to diagnose MRSA infection nor to guide or monitor treatment for MRSA infections.     Medical History: Past Medical History  Diagnosis Date  . PAF (paroxysmal atrial fibrillation)     a. new onset 12/2014 in the setting of UTI, sepsis, hypotension, and anemia; b. not on long term anticoagulation given anemia; c. family aware of stroke risk, they are ok with this; d. on amiodarone   . Chronic combined systolic and diastolic CHF (congestive heart failure)     a. echo 12/2014: EF 25-30%, anterior wall wall motion  abnormalities; b. planned ischemic evaluation once patient is stable medically; c. echo 01/2015: EF 45-50%, no RWMA, mild AT, LA mildly dilated, mod pericardial effusion along LV free wall, no evidence of hemodynamic compromise  . ESRD on hemodialysis     a. Tuesday, Thursday, and Saturdays  . Anemia     a. baseline hgb ~ 8  . History of small bowel obstruction     a. 01/2015  . History of septic shock     a. 01/2015  . GERD (gastroesophageal reflux disease)   . Allergic rhinitis   . COPD (chronic obstructive pulmonary disease)   . Hypercholesteremia   . Cognitive communication deficit   . Magnesium deficiency   . Dementia   . Iliac aneurysm   . Incontinence   . Hydronephrosis   . Overactive bladder   . Detrusor sphincter dyssynergia   . Mini stroke   . Bladder cancer   . Prostate cancer   . Renal insufficiency   . Hypertension     Medications:  Scheduled:  . sodium chloride   Intravenous Once  . amiodarone  200 mg Oral Daily  . amLODipine  2.5 mg Oral Daily  . atorvastatin  40 mg Oral Daily  . budesonide-formoterol  2 puff Inhalation BID  . feeding supplement (NEPRO CARB STEADY)  237 mL Oral BID BM  . ferrous sulfate  325 mg Oral Q breakfast  . guaiFENesin  600 mg Oral BID  .  levofloxacin  500 mg Oral Q48H  . magnesium oxide  400 mg Oral QPM  . pantoprazole  40 mg Oral BID  . polyethylene glycol  17 g Oral Daily  . predniSONE  60 mg Oral Q breakfast  . sevelamer carbonate  800 mg Oral TID AC  . sodium chloride  3 mL Intravenous Q12H  . tiotropium  18 mcg Inhalation Daily   Assessment: Pt is a 74 year old male with suspected HCAP. Pt is on HD  Goal of Therapy:  resolution of infection  Plan:  Continue with levofloxacin po 500mg  q 48 hours for a total of 7-14 days. Pt was started on 9/14  Pharmacy to continue to monitor Thank you for the consult  Ramond Dial, Pharm.D Clinical Pharmacist   06/09/2015,8:13 AM

## 2015-06-09 NOTE — Consult Note (Signed)
Pt with multiple problems including ESRD, bilat pleural effusions,anemia, CHF, COPD from smoking.  Would consider transfusion up to 8 or more due to multiple problems.  Consider possible tap of pleural effusions.  No GI interventions at this time given his tenuous lung status unless life threatening  Bleeding.

## 2015-06-09 NOTE — Plan of Care (Signed)
Problem: Discharge Progression Outcomes Goal: Discharge plan in place and appropriate Outcome: Progressing Individualization of Care Pt prefers to be called [name] Hx of paroxysmal atrial fibrillation, CHF, ESRD, anemia, small bowel obstruction, sepsis, GERD, COPD, hypercholesteremia, magnesium deficiency, dementia, Iliac aneurysm, incontinence, hydronephrosis, overactive bladder, stroke, bladder and prostate cancer, HTN, renal insufficiency. Patient was positive for MRSA in nares on last admission less than 30 days ago. He remains on MRSA precautions.   Goal: Other Discharge Outcomes/Goals 1. Pain: Patient without complaints of pain this shift. 2. Hemodynamically: Patient afebrile and VSS throughout shift. Hgb was 6.7 yesterday, received 1 unit PRBC.  Tolerated well during shift. BP 114/61 mmHg  Pulse 72  Temp(Src) 97.5 F (36.4 C) (Oral)  Resp 18  Ht 6\' 2"  (1.88 m)  Wt 202 lb 6.1 oz (91.8 kg)  BMI 25.97 kg/m2  SpO2 99% 3. Complications: Patient without complaints of SOB this shift.   4. Diet: Patient on renal/fluid restricted diet. Patient did eat sandwich tray due to missed meal when he was in dialysis.  Tolerated well.   5. Activity: Patient on bedrest this shift. He was in chair during day shift and tolerated well.  Still need occult stool sample, patient did not stool this shift.

## 2015-06-09 NOTE — Plan of Care (Signed)
Problem: Discharge Progression Outcomes Goal: Other Discharge Outcomes/Goals Outcome: Progressing Plan of care progress to goal:  No complaints of pain. Hgb 8. No complaints of sob - patient remains on 2L-O2, sat 99%. No bowel movement - stool softner/laxative given. GI consult completed. Patient to have CT. Family at bedside.

## 2015-06-09 NOTE — Progress Notes (Signed)
Subjective:   Doing well overall Reports constipation No shortness of breath  Objective:  Vital signs in last 24 hours:  Temp:  [97.5 F (36.4 C)-98.7 F (37.1 C)] 98.7 F (37.1 C) (09/16 0451) Pulse Rate:  [66-78] 66 (09/16 0451) Resp:  [18-27] 18 (09/16 0451) BP: (114-142)/(61-93) 116/80 mmHg (09/16 0451) SpO2:  [99 %] 99 % (09/16 0451)  Weight change:  Filed Weights   06/06/15 0648 06/06/15 1500 06/06/15 1815  Weight: 92.08 kg (203 lb) 93.4 kg (205 lb 14.6 oz) 91.8 kg (202 lb 6.1 oz)    Intake/Output:    Intake/Output Summary (Last 24 hours) at 06/09/15 1105 Last data filed at 06/08/15 1700  Gross per 24 hour  Intake    240 ml  Output    966 ml  Net   -726 ml     Physical Exam: General: NAD, laying in bed  HEENT anicteric  Neck supple  Pulm/lungs normal effort, clear  CVS/Heart Irregular, no rub  Abdomen:  Soft, non distended, non tender  Extremities: Trace edema  Neurologic: Alert, oriented  Skin: No acute rashes  Access: AVF       Basic Metabolic Panel:   Recent Labs Lab 06/06/15 0727 06/08/15 0520 06/08/15 1135  NA 142  --  138  K 4.1  --  4.9  CL 100*  --  96*  CO2 29  --  28  GLUCOSE 125*  --  156*  BUN 40*  --  55*  CREATININE 4.59*  --  4.45*  CALCIUM 8.5*  --  8.6*  PHOS  --  2.7 3.1     CBC:  Recent Labs Lab 06/06/15 0727 06/07/15 1133 06/08/15 0520 06/09/15 0840  WBC 13.3*  --  9.3 10.6  NEUTROABS  --   --  8.2* 7.5*  HGB 6.4* 7.9* 6.7* 8.0*  HCT 20.5* 24.2* 20.2* 24.4*  MCV 92.4  --  89.5 90.3  PLT 287  --  218 170      Microbiology:  Recent Results (from the past 720 hour(s))  MRSA PCR Screening     Status: None   Collection Time: 06/07/15  7:46 PM  Result Value Ref Range Status   MRSA by PCR NEGATIVE NEGATIVE Final    Comment:        The GeneXpert MRSA Assay (FDA approved for NASAL specimens only), is one component of a comprehensive MRSA colonization surveillance program. It is not intended to  diagnose MRSA infection nor to guide or monitor treatment for MRSA infections.     Coagulation Studies: No results for input(s): LABPROT, INR in the last 72 hours.  Urinalysis: No results for input(s): COLORURINE, LABSPEC, PHURINE, GLUCOSEU, HGBUR, BILIRUBINUR, KETONESUR, PROTEINUR, UROBILINOGEN, NITRITE, LEUKOCYTESUR in the last 72 hours.  Invalid input(s): APPERANCEUR    Imaging: Dg Chest Right Decubitus  06/07/2015   CLINICAL DATA:  Followup pleural effusion.  Short breath.  EXAM: CHEST - RIGHT DECUBITUS  COMPARISON:  Chest CT, 06/06/2015  FINDINGS: There is a small layering right pleural effusion. Additional dependent opacity in the right mid and lower lung is likely atelectasis.  IMPRESSION: Small layering right pleural effusion. Associated mid to lower lung zone parenchymal opacity most likely atelectasis.   Electronically Signed   By: Lajean Manes M.D.   On: 06/07/2015 14:47     Medications:     . sodium chloride   Intravenous Once  . amiodarone  200 mg Oral Daily  . amLODipine  2.5 mg Oral Daily  .  atorvastatin  40 mg Oral Daily  . budesonide-formoterol  2 puff Inhalation BID  . feeding supplement (NEPRO CARB STEADY)  237 mL Oral BID BM  . ferrous sulfate  325 mg Oral Q breakfast  . guaiFENesin  600 mg Oral BID  . levofloxacin  500 mg Oral Q48H  . magnesium oxide  400 mg Oral QPM  . pantoprazole  40 mg Oral BID  . polyethylene glycol  17 g Oral Daily  . predniSONE  60 mg Oral Q breakfast  . sevelamer carbonate  800 mg Oral TID AC  . sodium chloride  3 mL Intravenous Q12H  . tiotropium  18 mcg Inhalation Daily   acetaminophen **OR** acetaminophen, albuterol, alum & mag hydroxide-simeth, bismuth subsalicylate, HYDROcodone-acetaminophen, ipratropium-albuterol, nicotine, ondansetron **OR** ondansetron (ZOFRAN) IV, senna-docusate  Assessment/ Plan:  74 y.o. 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 ESRD. [address] dialysis. T-T-S Dialysis tomorrow Blood transfusion with dialysis given yesterday 2. Anemia of chronic kidney disease. - Discussed the use of Procrit with Dr. Mike Gip. - Weighing risks and benefits, we will get the patient restarted on Procrit - Discussed risks with patient 3. Secondary hyperparathyroidism - Monitor phosphorus this admission 4. Constipation. - Lactulose, MiraLAX   LOS: 3 SINGH,HARMEET 9/16/201611:05 AM

## 2015-06-09 NOTE — Care Management (Signed)
Spoke with Dr. Posey Pronto. Possible discharge to home tomorrow. Hgb 8.0 today, received a unit of blood during dialysis yesterday. Will repeats labs tomorrow, if HGB stable, will discharge to home. Will be followed by [facility] for home health and therapy. Will need home nebulizer. Transportation per [employer]. Shelbie Ammons RN MSN Care Management [phone]

## 2015-06-10 LAB — RENAL FUNCTION PANEL
ALBUMIN: 3 g/dL — AB (ref 3.5–5.0)
Anion gap: 13 (ref 5–15)
BUN: 74 mg/dL — AB (ref 6–20)
CALCIUM: 8.4 mg/dL — AB (ref 8.9–10.3)
CO2: 29 mmol/L (ref 22–32)
CREATININE: 3.91 mg/dL — AB (ref 0.61–1.24)
Chloride: 97 mmol/L — ABNORMAL LOW (ref 101–111)
GFR calc Af Amer: 16 mL/min — ABNORMAL LOW (ref >60)
GFR calc non Af Amer: 14 mL/min — ABNORMAL LOW (ref >60)
GLUCOSE: 131 mg/dL — AB (ref 65–99)
PHOSPHORUS: 2.2 mg/dL — AB (ref 2.5–4.6)
Potassium: 5.4 mmol/L — ABNORMAL HIGH (ref 3.5–5.1)
SODIUM: 139 mmol/L (ref 135–145)

## 2015-06-10 LAB — CBC
HCT: 23.3 % — ABNORMAL LOW (ref 40.0–52.0)
HEMOGLOBIN: 7.5 g/dL — AB (ref 13.0–18.0)
MCH: 29.2 pg (ref 26.0–34.0)
MCHC: 32.3 g/dL (ref 32.0–36.0)
MCV: 90.2 fL (ref 80.0–100.0)
PLATELETS: 146 10*3/uL — AB (ref 150–440)
RBC: 2.58 MIL/uL — ABNORMAL LOW (ref 4.40–5.90)
RDW: 16.5 % — AB (ref 11.5–14.5)
WBC: 11.8 10*3/uL — ABNORMAL HIGH (ref 3.8–10.6)

## 2015-06-10 MED ORDER — PREDNISONE 20 MG PO TABS: 60.0000 mg | ORAL_TABLET | Freq: Every day | ORAL | Status: DC

## 2015-06-10 MED ORDER — EPOETIN ALFA 10000 UNIT/ML IJ SOLN
10000.0000 [IU] | INTRAMUSCULAR | Status: DC
  Administered 2015-06-10: 10000 [IU] via INTRAVENOUS

## 2015-06-10 MED ORDER — IPRATROPIUM-ALBUTEROL 0.5-2.5 (3) MG/3ML IN SOLN: 3.0000 mL | RESPIRATORY_TRACT | Status: DC | PRN

## 2015-06-10 MED ORDER — ALBUTEROL SULFATE (2.5 MG/3ML) 0.083% IN NEBU: 2.5000 mg | INHALATION_SOLUTION | RESPIRATORY_TRACT | Status: AC | PRN

## 2015-06-10 NOTE — Plan of Care (Signed)
Problem: Discharge Progression Outcomes Goal: Other Discharge Outcomes/Goals Outcome: Progressing Pt resting quietly throughout the shift. Telemetry order completed. Dr. Jannifer Franklin notified at 2:05am. Order placed to discontinue tele monitoring. VSS afebrile.

## 2015-06-10 NOTE — Progress Notes (Signed)
Notified MD of HGB and platelets level, 7.5 & 146,000.

## 2015-06-10 NOTE — Care Management Note (Signed)
Case Management Note  Patient Details  Name: [name] MRN: [id number] Date of Birth: 1940/11/22  Subjective/Objective:      Discussed home health planning with Dr Fritzi Mandes who ordered home health PT and RN for [name]. Dr Posey Pronto stated that the nebulizer could be shipped to [name]'s home and this Probation officer notified [facility]. A referral was faxed to [facility] requesting home health PT and RN services.               Action/Plan:   Expected Discharge Date:                  Expected Discharge Plan:     In-House Referral:     Discharge planning Services     Post Acute Care Choice:    Choice offered to:     DME Arranged:    DME Agency:     HH Arranged:    [place] Agency:     Status of Service:     Medicare Important Message Given:  Yes-second notification given Date Medicare IM Given:    Medicare IM give by:    Date Additional Medicare IM Given:    Additional Medicare Important Message give by:     If discussed at [place] of Stay Meetings, dates discussed:    Additional Comments:  Rockett,Marilyn A, RN 06/10/2015, 3:25 PM

## 2015-06-10 NOTE — Progress Notes (Addendum)
Pt discharged home with home health per MD order. IV removed. Discharge instructions given to patient. All questions answered pt verbalized understanding. Pt left via wheelchair with family and nursing staff.

## 2015-06-10 NOTE — Care Management Note (Signed)
Case Management Note  Patient Details  Name: [name] MRN: [id number] Date of Birth: [birth year]  Subjective/Objective:   Spoke with [name] with his permission to discuss transportation home today for [name]. Ms [employer] stated that her son would transport [name] home today [facility] as soon as [name] called her son to pick him up.Erin RN was notified of transportation plans.                   Action/Plan:   Expected Discharge Date:                  Expected Discharge Plan:     In-House Referral:     Discharge planning Services     Post Acute Care Choice:    Choice offered to:     DME Arranged:    DME Agency:     HH Arranged:    [place] Agency:     Status of Service:     Medicare Important Message Given:  Yes-second notification given Date Medicare IM Given:    Medicare IM give by:    Date Additional Medicare IM Given:    Additional Medicare Important Message give by:     If discussed at [place] of Stay Meetings, dates discussed:    Additional Comments:  Rockett,Marilyn A, RN 06/10/2015, 3:21 PM

## 2015-06-10 NOTE — Consult Note (Signed)
Pt in dialysis, he reports feeling OK, his stool was heme neg.  No evidence of acute blood loss. Chest clear on right with decreased breath sounds somewhat on left.  No other GI plans at this time.  Hgb 7.5. No GI interventions planned.

## 2015-06-10 NOTE — Discharge Summary (Signed)
[facility] NAME: [name]    MR#:  [id number]  DATE OF BIRTH:  1941/03/21  DATE OF ADMISSION:  06/06/2015 ADMITTING PHYSICIAN: Bettey Costa, MD  DATE OF DISCHARGE: 06/10/15  PRIMARY CARE PHYSICIAN: Dion Body, MD    ADMISSION DIAGNOSIS:  Pleural effusion [J90] Symptomatic anemia [D64.9]  DISCHARGE DIAGNOSIS:  Symptomatic anemia s/p BT ESRD on HD COPD flare  SECONDARY DIAGNOSIS:   Past Medical History  Diagnosis Date  . PAF (paroxysmal atrial fibrillation)     a. new onset 12/2014 in the setting of UTI, sepsis, hypotension, and anemia; b. not on long term anticoagulation given anemia; c. family aware of stroke risk, they are ok with this; d. on amiodarone   . Chronic combined systolic and diastolic CHF (congestive heart failure)     a. echo 12/2014: EF 25-30%, anterior wall wall motion abnormalities; b. planned ischemic evaluation once patient is stable medically; c. echo 01/2015: EF 45-50%, no RWMA, mild AT, LA mildly dilated, mod pericardial effusion along LV free wall, no evidence of hemodynamic compromise  . ESRD on hemodialysis     a. Tuesday, Thursday, and Saturdays  . Anemia     a. baseline hgb ~ 8  . History of small bowel obstruction     a. 01/2015  . History of septic shock     a. 01/2015  . GERD (gastroesophageal reflux disease)   . Allergic rhinitis   . COPD (chronic obstructive pulmonary disease)   . Hypercholesteremia   . Cognitive communication deficit   . Magnesium deficiency   . Dementia   . Iliac aneurysm   . Incontinence   . Hydronephrosis   . Overactive bladder   . Detrusor sphincter dyssynergia   . Mini stroke   . Bladder cancer   . Prostate cancer   . Renal insufficiency   . Hypertension     HOSPITAL COURSE:    74 year old male with end-stage renal disease on hemodialysis, anemia of chronic disease and COPD with 2 L of oxygen at night presents with shortness of breath  and found to have anemia and possible COPD exacerbation.  1. Symptomatic anemia: Patient was admitted about a month ago with similar issue. He had a colonoscopy in 2012 which did not show evidence of malignancy. s/p 2 unit of PRBC. hgb 7.5 Hemolysis panel negative GI consult Outpatient hematology follow-up History of endoleak questionable causing the anemia  No obvious leak noted on CT angiogram of the abdomen  2. Presyncope: this is from his symptomatic anemia and possibly his shortness of breath.  transfused 2 unit of PRBC. Hgb 7.5 . Started on procirt shots at HD per dr singh  3. Right pleural effusion: This is potentially partially loculated. Appreciate pulmonary input too small to drain  4.. End-stage renal disease on hemodialysis:   5. Acute exacerbation of COPD:continue DuoNeb's , prednisone taper   6. hyperlipdemia: Continue Lipitor  7. History of PAF: Continue amiodarone.  8. Essential hypertension: Continue Norvasc.  Overall stable. Ok to go home  DISCHARGE CONDITIONS:  fair CONSULTS OBTAINED:    Erby Pian, MD Manya Silvas, MD  DRUG ALLERGIES:   Allergies  Allergen Reactions  . Penicillins Hives, Itching and Swelling    DISCHARGE MEDICATIONS:   Current Discharge Medication List    START taking these medications   Details  albuterol (PROVENTIL) (2.5 MG/3ML) 0.083% nebulizer solution Take 3 mLs (2.5 mg total) by nebulization every 4 (four)  hours as needed for wheezing or shortness of breath. Qty: 75 mL, Refills: 12    ipratropium-albuterol (DUONEB) 0.5-2.5 (3) MG/3ML SOLN Take 3 mLs by nebulization every 4 (four) hours as needed. Qty: 360 mL, Refills: 1    predniSONE (DELTASONE) 20 MG tablet Take 3 tablets (60 mg total) by mouth daily with breakfast. Taper by 20 mg daily then stop Qty: 4 tablet, Refills: 0      CONTINUE these medications which have NOT CHANGED   Details  albuterol (PROVENTIL HFA;VENTOLIN HFA) 108 (90 BASE)  MCG/ACT inhaler Inhale 2 puffs into the lungs every 4 (four) hours as needed for wheezing or shortness of breath.    amiodarone (PACERONE) 200 MG tablet Take 1 tablet (200 mg total) by mouth daily. Qty: 30 tablet, Refills: 5    amLODipine (NORVASC) 2.5 MG tablet Take 2.5 mg by mouth daily.    atorvastatin (LIPITOR) 40 MG tablet Take 1 tablet (40 mg total) by mouth daily. Qty: 30 tablet, Refills: 1    budesonide-formoterol (SYMBICORT) 160-4.5 MCG/ACT inhaler Inhale 2 puffs into the lungs 2 (two) times daily.    ferrous sulfate 325 (65 FE) MG tablet Take 1 tablet (325 mg total) by mouth daily with breakfast. Qty: 90 tablet, Refills: 0    guaiFENesin (MUCINEX) 600 MG 12 hr tablet Take 1 tablet (600 mg total) by mouth 2 (two) times daily. Qty: 20 tablet, Refills: 2    magnesium oxide (MAG-OX) 400 MG tablet Take 400 mg by mouth every evening.     pantoprazole (PROTONIX) 40 MG tablet Take 1 tablet (40 mg total) by mouth 2 (two) times daily. Qty: 30 tablet, Refills: 0    sevelamer carbonate (RENVELA) 800 MG tablet Take 800 mg by mouth 3 (three) times daily before meals.     tiotropium (SPIRIVA) 18 MCG inhalation capsule Place 1 capsule (18 mcg total) into inhaler and inhale daily. Qty: 30 capsule, Refills: 12    chlorpheniramine-HYDROcodone (TUSSIONEX) 10-8 MG/5ML SUER Take 5 mLs by mouth every 12 (twelve) hours. Qty: 140 mL, Refills: 0    levofloxacin (LEVAQUIN) 500 MG tablet Take 1 tablet by mouth every other day.    nicotine (NICOTROL) 10 MG inhaler Inhale 1 cartridge (1 continuous puffing total) into the lungs as needed for smoking cessation. Qty: 42 each, Refills: 0    Nutritional Supplements (FEEDING SUPPLEMENT, NEPRO CARB STEADY,) LIQD Take 237 mLs by mouth 2 (two) times daily between meals. Qty: 30 Can, Refills: 0    polyethylene glycol (MIRALAX / GLYCOLAX) packet Take 17 g by mouth daily. Qty: 14 each, Refills: 0        If you experience worsening of your admission  symptoms, develop shortness of breath, life threatening emergency, suicidal or homicidal thoughts you must seek medical attention immediately by calling 911 or calling your MD immediately  if symptoms less severe.  You Must read complete instructions/literature along with all the possible adverse reactions/side effects for all the Medicines you take and that have been prescribed to you. Take any new Medicines after you have completely understood and accept all the possible adverse reactions/side effects.   Please note  You were cared for by a hospitalist during your hospital stay. If you have any questions about your discharge medications or the care you received while you were in the hospital after you are discharged, you can call the unit and asked to speak with the hospitalist on call if the hospitalist that took care of you is not available. Once  you are discharged, your primary care physician will handle any further medical issues. Please note that NO REFILLS for any discharge medications will be authorized once you are discharged, as it is imperative that you return to your primary care physician (or establish a relationship with a primary care physician if you do not have one) for your aftercare needs so that they can reassess your need for medications and monitor your lab values. Today   SUBJECTIVE   Doing well. Seen at HD  VITAL SIGNS:  Blood pressure 136/81, pulse 72, temperature 97.5 F (36.4 C), temperature source Oral, resp. rate 20, height 6\' 2"  (1.88 m), weight 91.8 kg (202 lb 6.1 oz), SpO2 99 %.  I/O:   Intake/Output Summary (Last 24 hours) at 06/10/15 1454 Last data filed at 06/10/15 0800  Gross per 24 hour  Intake    360 ml  Output      0 ml  Net    360 ml    PHYSICAL EXAMINATION:  GENERAL:  74 y.o.-year-old patient lying in the bed with no acute distress.  EYES: Pupils equal, round, reactive to light and accommodation. No scleral icterus. Extraocular muscles intact.   HEENT: Head atraumatic, normocephalic. Oropharynx and nasopharynx clear.  NECK:  Supple, no jugular venous distention. No thyroid enlargement, no tenderness.  LUNGSdecreased breath sounds bilaterally basally, no wheezing, rales,rhonchi or crepitation. No use of accessory muscles of respiration.  CARDIOVASCULAR: S1, S2 normal. No murmurs, rubs, or gallops.  ABDOMEN: Soft, non-tender, non-distended. Bowel sounds present. No organomegaly or mass.  EXTREMITIES: No pedal edema, cyanosis, or clubbing.  NEUROLOGIC: Cranial nerves II through XII are intact. Muscle strength 5/5 in all extremities. Sensation intact. Gait not checked.  PSYCHIATRIC: The patient is alert and oriented x 3.  SKIN: No obvious rash, lesion, or ulcer.   DATA REVIEW:   CBC   Recent Labs Lab 06/10/15 1240  WBC 11.8*  HGB 7.5*  HCT 23.3*  PLT 146*    Chemistries   Recent Labs Lab 06/06/15 0727  06/10/15 1116  NA 142  < > 139  K 4.1  < > 5.4*  CL 100*  < > 97*  CO2 29  < > 29  GLUCOSE 125*  < > 131*  BUN 40*  < > 74*  CREATININE 4.59*  < > 3.91*  CALCIUM 8.5*  < > 8.4*  AST 21  --   --   ALT 8*  --   --   ALKPHOS 82  --   --   BILITOT 1.0  --   --   < > = values in this interval not displayed.  Microbiology Results   Recent Results (from the past 240 hour(s))  MRSA PCR Screening     Status: None   Collection Time: 06/07/15  7:46 PM  Result Value Ref Range Status   MRSA by PCR NEGATIVE NEGATIVE Final    Comment:        The GeneXpert MRSA Assay (FDA approved for NASAL specimens only), is one component of a comprehensive MRSA colonization surveillance program. It is not intended to diagnose MRSA infection nor to guide or monitor treatment for MRSA infections.     RADIOLOGY:  Ct Angio Abd/pel W/ And/or W/o  06/09/2015   CLINICAL DATA:  Abdominal aortic aneurysm, post endovascular repair complicated by development of an endoleak.  EXAM: CT ANGIOGRAPHY ABDOMEN AND PELVIS WITH CONTRAST AND  WITHOUT CONTRAST  TECHNIQUE: Multidetector CT imaging of the abdomen and pelvis was performed  using the standard protocol during bolus administration of intravenous contrast. Multiplanar reconstructed images including MIPs were obtained and reviewed to evaluate the vascular anatomy.  CONTRAST:  144mL OMNIPAQUE IOHEXOL 350 MG/ML SOLN  COMPARISON:  CTA abdomen pelvis - 03/21/2015 ; 12/10/2011; chest CTA -06/06/2015  FINDINGS: Vascular Findings:  Abdominal aorta: Post endovascular repair of infrarenal abdominal aortic aneurysm. The proximal end of the stent graft pillars well apposed against the walls of the infrarenal abdominal aorta. Distal ends of the stent graft appear well apposed against the walls of the bilateral external iliac arteries. Stable sequela of coil embolization of the bilateral internal iliac arteries.  There is persistent enhancement of the native abdominal aortic aneurysm sac (best seen on the acquired portal venous phase images (representative image 14, series 5) likely supplied via retrograde flow from the slightly ectatic IMA (axial image 112, series 6, coronal image 65, series 14). The native abdominal aortic aneurysm sac is unchanged in size measuring 5.4 x 5.2 x 5.4 cm in greatest oblique axial (image 105, series 6), coronal (image 79, series 9) and sagittal (image 123, series 12) dimensions respectively, very minimally increased in size since the 02/2019 04/2015 examination, previously, 5.4 x 5.0 x 5.3 cm.  There is unchanged focal outpouching involving the anterior aspect of the native aortic aneurysm sac (image 16, series 2) without definitive periaortic stranding.  Celiac artery: There is a minimal amount of eccentric mixed calcified and noncalcified atherosclerotic plaque involving the origin the celiac artery, not definitively resulting in a hemodynamically significant stenosis. A celiac artery remains ectatic throughout its imaged course. Conventional branching pattern.  SMA: There is  a minimal amount of eccentric mixed calcified and noncalcified atherosclerotic plaque throughout the main trunk of the SMA, not resulting in hemodynamically significant stenosis. The main trunk of the SMA as well as many of its branches are mildly ectatic. No discrete intraluminal filling defect suggest distal embolism.  Right Renal artery: Solitary; widely patent without hemodynamically significant narrowing.  Left Renal artery: Solitary; widely patent without hemodynamically significant narrowing.  IMA: Remains patent and remains the predominant supply to the persistent type 2 endoleak.  Right-sided past pelvic vasculature: The right limb of the endovascular stent is well apposed against the walls of the proximal aspect of the right external iliac artery. Stable sequela of coil embolization of the right internal iliac artery. The right external iliac artery remains tortuous but widely patent without hemodynamically significant narrowing.  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 The large amount of irregular thrombus within the aneurysm is unchanged to minimally increased in the interval. No contrast extravasation or definite perivascular stranding.  Left-sided pelvic vasculature: The left limb of the stent graft is well apposed against the walls of the left external iliac artery. Post coil embolization of left internal iliac artery. The left external iliac artery is tortuous but widely patent without hemodynamically significant stenosis. The left common and superficial femoral arteries remain ectatic.  Review of the MIP images confirms the above findings.    --------------------------------------------------------------------------------  Nonvascular Findings:  Normal hepatic contour. No discrete hepatic lesions. Normal appearance of the gallbladder given degree distention. No definite radiopaque gallstones. No intra extrahepatic biliary duct dilatation. No ascites.  There is symmetric enhancement and  excretion of the bilateral kidneys. Interval removal of previously noted left-sided double-J ureteral stent. There is grossly unchanged mild to moderate bilateral pelvicaliectasis and ureterectasis to the level of the urinary bladder. There is diffuse thickening of the urinary bladder wall as well as diffuse uroepithelial enhancement. No discrete renal lesions. No renal stones. Mild bilateral perinephric stranding.  An approximately 1.4 cm nodule within the lateral limb of the right adrenal gland as well as an approximately 1.2 cm nodule within the medial limb of the right adrenal gland are both unchanged since the 11/2011 examination favored to represent benign adrenal adenomas. There is mild thickening of the crux of the left adrenal gland without discrete nodule. Normal appearance of the pancreas and spleen.  Scattered colonic diverticulosis without evidence of diverticulitis. Large colonic stool burden without evidence of enteric obstruction. Normal appearance of the retrocecal appendix. No pneumoperitoneum, pneumatosis or portal venous gas.  Scattered shotty retroperitoneal lymph nodes are numerous though individually not enlarged by size criteria with index retroperitoneal aortocaval lymph node measuring 0.9 cm in greatest short axis diameter (image 120, series 6), similar to the 02/2013 examination. No bulky retroperitoneal, mesenteric, pelvic or inguinal lymphadenopathy. Scattered calcifications within a normal sized prostate gland.  Limited visualization of lower thorax demonstrates interval increase in size of small right and trace left-sided pleural effusions.  Minimal dependent subsegmental atelectasis.  Marked cardiomegaly. Coronary artery calcifications. No pericardial effusion.  No definite acute or aggressive osseous lesions. Mild to moderate multilevel lumbar spine DDD, worse at L4-L5 with disc space height loss, endplate irregularity and sclerosis. There is osseous fusion of the L5-S1 intervertebral disc. Moderate severe degenerative change of the bilateral hips with joint space loss, subchondral sclerosis and osteophytosis.  Unchanged approximately 1.0 x 1.6 x 0.8 cm sclerotic lesion involving the posterior aspect of the T12 vertebral body which is unchanged since the 11/2011 examination and favored to represent a hemangioma or bone island.  Mild diffuse body wall anasarca with small amount of fluid seen within the pelvic cul-de-sac.  IMPRESSION: Vascular Impression:  1. Stable sequela of endovascular repair of infrarenal abdominal aortic aneurysm with persistent findings of a type 2 endoleak supplied via the IMA with continued slight interval enlargement of the native abdominal aortic aneurysm sac, increasing in diameter by approximately 1-2 mm since the 03/21/2015 examination. 2. Unchanged focal outpouching involving the anterior aspect of the native abdominal aortic aneurysm without associated periaortic stranding. 3. 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 No contrast extravasation or definite perivascular stranding. Nonvascular Impression:  1. Cardiomegaly with interval increase in size of a small right and trace left-sided pleural effusions and slight worsening of body wall edema, constellation of findings suggestive of congestive heart failure. 2. Post left-sided double-J ureteral stent removal. 3. Similar-appearing mild to moderate bilateral pelvicaliectasis and ureterectasis to the level of the urinary bladder. There is nonspecific rather  diffuse mural epithelium enhancement and as such, correlation with urinalysis is recommended. 4. There is persistent irregular thickening of the urinary bladder wall findings likely compatible with provided history of bladder cancer.   Electronically Signed   By: Sandi Mariscal M.D.   On: 06/09/2015 17:33     Management plans discussed with the patient, family and they are in agreement.  CODE STATUS:     Code Status Orders        Start     Ordered   06/06/15 1340  Full code  Continuous     06/06/15 1340    Advance Directive Documentation        Most Recent Value   Type of Advance Directive  Healthcare Power of Attorney   Pre-existing out of facility DNR order (yellow form or pink MOST form)     "MOST" Form in Place?        TOTAL TIME TAKING CARE OF THIS PATIENT: 40 minutes.    PATEL,SONA M.D on 06/10/2015 at 2:54 PM  Between 7am to 6pm - Pager - [phone] After 6pm go to [email or web address] - password [email or web address] [facility]  Office  [phone]  CC: Primary care physician; Dion Body, MD

## 2015-06-10 NOTE — Progress Notes (Signed)
Subjective:   Doing well overall Reports constipation has now resolved. Has had multiple stools since yesterday evening No shortness of breath No abdominal pain  Objective:  Vital signs in last 24 hours:  Temp:  [97.5 F (36.4 C)-97.6 F (36.4 C)] 97.5 F (36.4 C) (09/17 0540) Pulse Rate:  [65-70] 65 (09/17 0540) Resp:  [18] 18 (09/16 1305) BP: (118-137)/(73-90) 137/90 mmHg (09/17 0540) SpO2:  [99 %-100 %] 100 % (09/17 0540)  Weight change:  Filed Weights   06/06/15 0648 06/06/15 1500 06/06/15 1815  Weight: 92.08 kg (203 lb) 93.4 kg (205 lb 14.6 oz) 91.8 kg (202 lb 6.1 oz)    Intake/Output:    Intake/Output Summary (Last 24 hours) at 06/10/15 1040 Last data filed at 06/10/15 0800  Gross per 24 hour  Intake    603 ml  Output      0 ml  Net    603 ml     Physical Exam: General: NAD, laying in bed  HEENT anicteric  Neck supple  Pulm/lungs normal effort, clear  CVS/Heart Irregular, no rub  Abdomen:  Soft, non distended, non tender  Extremities: Trace edema  Neurologic: Alert, oriented  Skin: No acute rashes  Access: AVF       Basic Metabolic Panel:   Recent Labs Lab 06/06/15 0727 06/08/15 0520 06/08/15 1135  NA 142  --  138  K 4.1  --  4.9  CL 100*  --  96*  CO2 29  --  28  GLUCOSE 125*  --  156*  BUN 40*  --  55*  CREATININE 4.59*  --  4.45*  CALCIUM 8.5*  --  8.6*  PHOS  --  2.7 3.1     CBC:  Recent Labs Lab 06/06/15 0727 06/07/15 1133 06/08/15 0520 06/08/15 1300 06/09/15 0840  WBC 13.3*  --  9.3  --  10.6  NEUTROABS  --   --  8.2*  --  7.5*  HGB 6.4* 7.9* 6.7*  --  8.0*  HCT 20.5* 24.2* 20.2* 20.0* 24.4*  MCV 92.4  --  89.5  --  90.3  PLT 287  --  218  --  170      Microbiology:  Recent Results (from the past 720 hour(s))  MRSA PCR Screening     Status: None   Collection Time: 06/07/15  7:46 PM  Result Value Ref Range Status   MRSA by PCR NEGATIVE NEGATIVE Final    Comment:        The GeneXpert MRSA Assay (FDA approved  for NASAL specimens only), is one component of a comprehensive MRSA colonization surveillance program. It is not intended to diagnose MRSA infection nor to guide or monitor treatment for MRSA infections.     Coagulation Studies: No results for input(s): LABPROT, INR in the last 72 hours.  Urinalysis: No results for input(s): COLORURINE, LABSPEC, PHURINE, GLUCOSEU, HGBUR, BILIRUBINUR, KETONESUR, PROTEINUR, UROBILINOGEN, NITRITE, LEUKOCYTESUR in the last 72 hours.  Invalid input(s): APPERANCEUR    Imaging: Ct Angio Abd/pel W/ And/or W/o  06/09/2015   CLINICAL DATA:  Abdominal aortic aneurysm, post endovascular repair complicated by development of an endoleak.  EXAM: CT ANGIOGRAPHY ABDOMEN AND PELVIS WITH CONTRAST AND WITHOUT CONTRAST  TECHNIQUE: Multidetector CT imaging of the abdomen and pelvis was performed using the standard protocol during bolus administration of intravenous contrast. Multiplanar reconstructed images including MIPs were obtained and reviewed to evaluate the vascular anatomy.  CONTRAST:  151mL OMNIPAQUE IOHEXOL 350 MG/ML SOLN  COMPARISON:  CTA abdomen pelvis - 03/21/2015 ; 12/10/2011; chest CTA -06/06/2015  FINDINGS: Vascular Findings:  Abdominal aorta: Post endovascular repair of infrarenal abdominal aortic aneurysm. The proximal end of the stent graft pillars well apposed against the walls of the infrarenal abdominal aorta. Distal ends of the stent graft appear well apposed against the walls of the bilateral external iliac arteries. Stable sequela of coil embolization of the bilateral internal iliac arteries.  There is persistent enhancement of the native abdominal aortic aneurysm sac (best seen on the acquired portal venous phase images (representative image 14, series 5) likely supplied via retrograde flow from the slightly ectatic IMA (axial image 112, series 6, coronal image 65, series 14). The native abdominal aortic aneurysm sac is unchanged in size measuring 5.4 x 5.2  x 5.4 cm in greatest oblique axial (image 105, series 6), coronal (image 79, series 9) and sagittal (image 123, series 12) dimensions respectively, very minimally increased in size since the 02/2019 04/2015 examination, previously, 5.4 x 5.0 x 5.3 cm.  There is unchanged focal outpouching involving the anterior aspect of the native aortic aneurysm sac (image 16, series 2) without definitive periaortic stranding.  Celiac artery: There is a minimal amount of eccentric mixed calcified and noncalcified atherosclerotic plaque involving the origin the celiac artery, not definitively resulting in a hemodynamically significant stenosis. A celiac artery remains ectatic throughout its imaged course. Conventional branching pattern.  SMA: There is a minimal amount of eccentric mixed calcified and noncalcified atherosclerotic plaque throughout the main trunk of the SMA, not resulting in hemodynamically significant stenosis. The main trunk of the SMA as well as many of its branches are mildly ectatic. No discrete intraluminal filling defect suggest distal embolism.  Right Renal artery: Solitary; widely patent without hemodynamically significant narrowing.  Left Renal artery: Solitary; widely patent without hemodynamically significant narrowing.  IMA: Remains patent and remains the predominant supply to the persistent type 2 endoleak.  Right-sided past pelvic vasculature: The right limb of the endovascular stent is well apposed against the walls of the proximal aspect of the right external iliac artery. Stable sequela of coil embolization of the right internal iliac artery. The right external iliac artery remains tortuous but widely patent without hemodynamically significant narrowing.  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 The large amount of irregular thrombus within the aneurysm is unchanged to minimally increased in the interval. No contrast extravasation or definite perivascular stranding.  Left-sided pelvic vasculature: The left limb of the stent graft is well apposed against the walls of the left external iliac artery. Post coil embolization of left internal iliac artery. The left external iliac artery is tortuous but widely patent without hemodynamically significant stenosis. The left common and superficial femoral arteries remain ectatic.  Review of the MIP images confirms the above findings.   --------------------------------------------------------------------------------  Nonvascular Findings:  Normal hepatic contour. No discrete hepatic lesions. Normal appearance of the gallbladder given degree distention. No definite radiopaque gallstones. No intra extrahepatic biliary duct dilatation. No ascites.  There is symmetric enhancement and excretion of the bilateral kidneys. Interval removal of previously noted left-sided double-J ureteral stent. There is grossly unchanged mild to moderate bilateral pelvicaliectasis and ureterectasis to the level of the urinary bladder. There is diffuse thickening  of the urinary bladder wall as well as diffuse uroepithelial enhancement. No discrete renal lesions. No renal stones. Mild bilateral perinephric stranding.  An approximately 1.4 cm nodule within the lateral limb of the right adrenal gland as well as an approximately 1.2 cm nodule within the medial limb of the right adrenal gland are both unchanged since the 11/2011 examination favored to represent benign adrenal adenomas. There is mild thickening of the crux of the left adrenal gland without discrete nodule. Normal appearance of the pancreas and spleen.  Scattered colonic diverticulosis without evidence of diverticulitis. Large colonic stool burden without  evidence of enteric obstruction. Normal appearance of the retrocecal appendix. No pneumoperitoneum, pneumatosis or portal venous gas.  Scattered shotty retroperitoneal lymph nodes are numerous though individually not enlarged by size criteria with index retroperitoneal aortocaval lymph node measuring 0.9 cm in greatest short axis diameter (image 120, series 6), similar to the 02/2013 examination. No bulky retroperitoneal, mesenteric, pelvic or inguinal lymphadenopathy. Scattered calcifications within a normal sized prostate gland.  Limited visualization of lower thorax demonstrates interval increase in size of small right and trace left-sided pleural effusions. Minimal dependent subsegmental atelectasis.  Marked cardiomegaly. Coronary artery calcifications. No pericardial effusion.  No definite acute or aggressive osseous lesions. Mild to moderate multilevel lumbar spine DDD, worse at L4-L5 with disc space height loss, endplate irregularity and sclerosis. There is osseous fusion of the L5-S1 intervertebral disc. Moderate severe degenerative change of the bilateral hips with joint space loss, subchondral sclerosis and osteophytosis.  Unchanged approximately 1.0 x 1.6 x 0.8 cm sclerotic lesion involving the posterior aspect of the T12 vertebral body which is unchanged since the 11/2011 examination and favored to represent a hemangioma or bone island.  Mild diffuse body wall anasarca with small amount of fluid seen within the pelvic cul-de-sac.  IMPRESSION: Vascular Impression:  1. Stable sequela of endovascular repair of infrarenal abdominal aortic aneurysm with persistent findings of a type 2 endoleak supplied via the IMA with continued slight interval enlargement of the native abdominal aortic aneurysm sac, increasing in diameter by approximately 1-2 mm since the 03/21/2015 examination. 2. Unchanged focal outpouching involving the anterior aspect of the native abdominal aortic aneurysm without associated periaortic  stranding. 3. 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 No contrast extravasation or definite perivascular stranding. Nonvascular Impression:  1. Cardiomegaly with interval increase in size of a small right and trace left-sided pleural effusions and slight worsening of body wall edema, constellation of findings suggestive of congestive heart failure. 2. Post left-sided double-J ureteral stent removal. 3. Similar-appearing mild to moderate bilateral pelvicaliectasis and ureterectasis to the level of the urinary bladder. There is nonspecific rather diffuse mural epithelium enhancement and as such, correlation with urinalysis is recommended. 4. There is persistent irregular thickening of the urinary bladder wall findings likely compatible with provided history of bladder cancer.   Electronically Signed   By: Sandi Mariscal M.D.   On: 06/09/2015 17:33     Medications:     . sodium chloride   Intravenous Once  . amiodarone  200 mg Oral Daily  . amLODipine  2.5 mg Oral Daily  . atorvastatin  40 mg Oral Daily  . budesonide-formoterol  2 puff Inhalation BID  . docusate sodium  200 mg Oral BID  . epoetin (EPOGEN/PROCRIT) injection  10,000 Units Intravenous Q T,Th,Sa-HD  . feeding supplement (NEPRO CARB STEADY)  237 mL Oral BID BM  .  ferrous sulfate  325 mg Oral Q breakfast  . guaiFENesin  600 mg Oral BID  . levofloxacin  500 mg Oral Q48H  . magnesium oxide  400 mg Oral QPM  . pantoprazole  40 mg Oral BID  . polyethylene glycol  17 g Oral Daily  . predniSONE  60 mg Oral Q breakfast  . sevelamer carbonate  800 mg Oral TID AC  . sodium chloride  3 mL Intravenous Q12H  . tiotropium  18 mcg Inhalation Daily   acetaminophen **OR** acetaminophen, albuterol, alum & mag hydroxide-simeth, bismuth subsalicylate, HYDROcodone-acetaminophen, ipratropium-albuterol, lactulose, nicotine, ondansetron **OR**  ondansetron (ZOFRAN) IV, polyethylene glycol, senna-docusate  Assessment/ Plan:  74 y.o. 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 ESRD. [address] dialysis. T-T-S Dialysis today Blood transfusion with dialysis given this admission 2. Anemia of chronic kidney disease. - Discussed the use of Procrit with Dr. Mike Gip. - Weighing risks and benefits, we will get the patient restarted on Procrit - Discussed risks & benefits with patient earlier 3. Secondary hyperparathyroidism - Monitor phosphorus this admission 4. Constipation. - Lactulose, MiraLAX - resolved   LOS: 4 SINGH,HARMEET 9/17/201610:40 AM

## 2015-06-10 NOTE — Discharge Instructions (Signed)
Use nebulizer as instructed Cont your outpt HD as before

## 2015-06-12 ENCOUNTER — Ambulatory Visit: Payer: Medicare Other | Admitting: Hematology and Oncology

## 2015-06-22 ENCOUNTER — Encounter: Payer: Self-pay | Admitting: Hematology and Oncology

## 2015-06-23 ENCOUNTER — Inpatient Hospital Stay (HOSPITAL_BASED_OUTPATIENT_CLINIC_OR_DEPARTMENT_OTHER): Payer: Medicare Other | Admitting: Hematology and Oncology

## 2015-06-23 VITALS — BP 122/67 | HR 92 | Temp 97.5°F | Wt 203.0 lb

## 2015-06-23 DIAGNOSIS — D649 Anemia, unspecified: Secondary | ICD-10-CM

## 2015-06-23 DIAGNOSIS — D631 Anemia in chronic kidney disease: Secondary | ICD-10-CM | POA: Diagnosis not present

## 2015-06-23 DIAGNOSIS — C61 Malignant neoplasm of prostate: Secondary | ICD-10-CM | POA: Diagnosis not present

## 2015-06-23 DIAGNOSIS — Z8701 Personal history of pneumonia (recurrent): Secondary | ICD-10-CM | POA: Diagnosis not present

## 2015-06-23 DIAGNOSIS — K219 Gastro-esophageal reflux disease without esophagitis: Secondary | ICD-10-CM | POA: Diagnosis not present

## 2015-06-23 DIAGNOSIS — J449 Chronic obstructive pulmonary disease, unspecified: Secondary | ICD-10-CM | POA: Diagnosis not present

## 2015-06-23 DIAGNOSIS — Z8719 Personal history of other diseases of the digestive system: Secondary | ICD-10-CM | POA: Diagnosis not present

## 2015-06-23 DIAGNOSIS — I5040 Unspecified combined systolic (congestive) and diastolic (congestive) heart failure: Secondary | ICD-10-CM | POA: Diagnosis not present

## 2015-06-23 DIAGNOSIS — I480 Paroxysmal atrial fibrillation: Secondary | ICD-10-CM | POA: Diagnosis not present

## 2015-06-23 DIAGNOSIS — N186 End stage renal disease: Secondary | ICD-10-CM

## 2015-06-23 DIAGNOSIS — Z992 Dependence on renal dialysis: Secondary | ICD-10-CM | POA: Diagnosis not present

## 2015-06-23 DIAGNOSIS — C679 Malignant neoplasm of bladder, unspecified: Secondary | ICD-10-CM

## 2015-06-23 DIAGNOSIS — I120 Hypertensive chronic kidney disease with stage 5 chronic kidney disease or end stage renal disease: Secondary | ICD-10-CM | POA: Diagnosis not present

## 2015-06-23 DIAGNOSIS — Z8551 Personal history of malignant neoplasm of bladder: Secondary | ICD-10-CM

## 2015-06-23 DIAGNOSIS — D500 Iron deficiency anemia secondary to blood loss (chronic): Secondary | ICD-10-CM

## 2015-06-23 DIAGNOSIS — F0390 Unspecified dementia without behavioral disturbance: Secondary | ICD-10-CM

## 2015-06-23 DIAGNOSIS — F17210 Nicotine dependence, cigarettes, uncomplicated: Secondary | ICD-10-CM | POA: Diagnosis not present

## 2015-06-23 DIAGNOSIS — Z79899 Other long term (current) drug therapy: Secondary | ICD-10-CM | POA: Diagnosis not present

## 2015-06-23 DIAGNOSIS — Z79818 Long term (current) use of other agents affecting estrogen receptors and estrogen levels: Secondary | ICD-10-CM

## 2015-06-23 DIAGNOSIS — E780 Pure hypercholesterolemia: Secondary | ICD-10-CM | POA: Diagnosis not present

## 2015-06-23 NOTE — Progress Notes (Signed)
Patient her today for follow up on low hgb,  States he had labs yesterday at [facility] and it was 7.3.  Yellow bracelet placed on patient.  Patient educated to wear it to every appointment even if it is lab only appointment.

## 2015-06-23 NOTE — Progress Notes (Signed)
[facility] day:  06/23/2015  Chief Complaint: [name] is a 74 y.o. male with end stage renal disease on dialysis, prostate and bladder cancer, who is seen for reassessment after interval hospitalization.  HPI: The patient was last seen in the medical oncology on 06/05/2015.  At that time he was seen for initial assessment.  He had a history of prostate cancer for which limited the information was available. Subsequently notes from Dr. Edrick Oh were obtained.  PET scan from 06/2011 had revealed lymphadenopathy with sclerotic bone metastasis consistent with probable prostate cancer. There was bilateral hydronephrosis with bladder base thickening from probable prostate ingrowth. He had undergone a prostate biopsy on 07/23/2011.PSA was 12. Bone scan 07/30/2011 had revealed multiple areas consistent with metastatic disease. He was receiving Lupron every 6 months.  Discussions were held regarding potential consideration of Casodex if disease progressed in the future.  The patient also had a history of a bladder cancer.  Urology notes indicated a cystostomy with a bladder biopsy on 010/04/2012.  Notes from 08/09/2014 revealed persistent hydronephrosis and bilateral stents.  At last visit, slips were provided for additional testing.  Labs on 06/07/2014 revealed a ferritin of 6660 (prior value of 838 on 05/04/2015). Sedimentation rate was 80. Folate was 20.  Reticulocyte count was 3.4%, TSH was 1.07, ANA was negative, haptoglobin was 225 (normal), LDH 203 (98-192). PSA was 1.44 and 2.15 on 01/30/2015.  The patient was admitted to [facility] on 06/06/2015 with symptomatic anemia. He was dizzy and lightheaded with a near syncopal event. He had shortness of breath. CBC revealed a hematocrit of 20.5 and hemoglobin of 6.4. He received 2 units of packed red blood cells.  He underwent CT angiogram of the pelvis on 06/09/2015.  He had a stable  endovascular repair of an infrarenal abdominal aortic aneurysm with persistent findings of the type II endoleak supplied by the IMA with continued slight interval enlargement of the native abdominal aortic aneurysm sac (1-2 mm).  Hemoglobin was 7.5. Guaiac stools were negative. He was seen by GI. No intervention was felt necessary.  He was discharged on 06/10/2015.    He states that he undergoes dialysis on Tuesdays, Thursdays, and Saturdays. He denies any melana or hematochezia. Diet is good. He believes that he has received the Procrit twice with dialysis.   Past Medical History  Diagnosis Date  . PAF (paroxysmal atrial fibrillation)     a. new onset 12/2014 in the setting of UTI, sepsis, hypotension, and anemia; b. not on long term anticoagulation given anemia; c. family aware of stroke risk, they are ok with this; d. on amiodarone   . Chronic combined systolic and diastolic CHF (congestive heart failure)     a. echo 12/2014: EF 25-30%, anterior wall wall motion abnormalities; b. planned ischemic evaluation once patient is stable medically; c. echo 01/2015: EF 45-50%, no RWMA, mild AT, LA mildly dilated, mod pericardial effusion along LV free wall, no evidence of hemodynamic compromise  . ESRD on hemodialysis     a. Tuesday, Thursday, and Saturdays  . Anemia     a. baseline hgb ~ 8  . History of small bowel obstruction     a. 01/2015  . History of septic shock     a. 01/2015  . GERD (gastroesophageal reflux disease)   . Allergic rhinitis   . COPD (chronic obstructive pulmonary disease)   . Hypercholesteremia   . Cognitive communication deficit   .  Magnesium deficiency   . Dementia   . Iliac aneurysm   . Incontinence   . Hydronephrosis   . Overactive bladder   . Detrusor sphincter dyssynergia   . Mini stroke   . Bladder cancer   . Prostate cancer   . Renal insufficiency   . Hypertension     Past Surgical History  Procedure Laterality Date  . Cystoscopy w/ ureteral stent placement     . Transurethral resection of bladder tumor with gyrus (turbt-gyrus)    . Ureteral stent placement    . Prostate surgery      Removal  . Av fistula placement      left arm    Family History  Problem Relation Age of Onset  . Stroke Mother     Social History:  reports that he quit smoking about 6 weeks ago. He has never used smokeless tobacco. He reports that he does not drink alcohol or use illicit drugs.  The patient is alone today.  Transportation was provided by [employer] transportation.  Allergies:  Allergies  Allergen Reactions  . Penicillins Hives, Itching and Swelling    Current Medications: Current Outpatient Prescriptions  Medication Sig Dispense Refill  . albuterol (PROAIR HFA) 108 (90 BASE) MCG/ACT inhaler Inhale into the lungs.    Marland Kitchen albuterol (PROVENTIL HFA;VENTOLIN HFA) 108 (90 BASE) MCG/ACT inhaler Inhale 2 puffs into the lungs every 4 (four) hours as needed for wheezing or shortness of breath.    Marland Kitchen amiodarone (PACERONE) 200 MG tablet Take 1 tablet (200 mg total) by mouth daily. 30 tablet 5  . amLODipine (NORVASC) 2.5 MG tablet Take 2.5 mg by mouth daily.    Marland Kitchen atorvastatin (LIPITOR) 40 MG tablet Take 1 tablet (40 mg total) by mouth daily. 30 tablet 1  . budesonide-formoterol (SYMBICORT) 160-4.5 MCG/ACT inhaler Inhale 2 puffs into the lungs 2 (two) times daily.    . chlorpheniramine-HYDROcodone (TUSSIONEX) 10-8 MG/5ML SUER Take 5 mLs by mouth every 12 (twelve) hours. 140 mL 0  . ferrous sulfate 325 (65 FE) MG tablet Take 1 tablet (325 mg total) by mouth daily with breakfast. 90 tablet 0  . guaiFENesin (MUCINEX) 600 MG 12 hr tablet Take 1 tablet (600 mg total) by mouth 2 (two) times daily. 20 tablet 2  . ipratropium-albuterol (DUONEB) 0.5-2.5 (3) MG/3ML SOLN Take 3 mLs by nebulization every 4 (four) hours as needed. 360 mL 1  . levofloxacin (LEVAQUIN) 500 MG tablet Take 1 tablet by mouth every other day.    . magnesium oxide (MAG-OX) 400 MG tablet Take 400 mg by mouth  every evening.     . nicotine (NICOTROL) 10 MG inhaler Inhale 1 cartridge (1 continuous puffing total) into the lungs as needed for smoking cessation. 42 each 0  . Nutritional Supplements (FEEDING SUPPLEMENT, NEPRO CARB STEADY,) LIQD Take 237 mLs by mouth 2 (two) times daily between meals. 30 Can 0  . pantoprazole (PROTONIX) 40 MG tablet Take 1 tablet (40 mg total) by mouth 2 (two) times daily. 30 tablet 0  . polyethylene glycol (MIRALAX / GLYCOLAX) packet Take 17 g by mouth daily. 14 each 0  . predniSONE (DELTASONE) 20 MG tablet Take 3 tablets (60 mg total) by mouth daily with breakfast. Taper by 20 mg daily then stop 4 tablet 0  . sevelamer carbonate (RENVELA) 800 MG tablet Take 800 mg by mouth 3 (three) times daily before meals.     . tiotropium (SPIRIVA) 18 MCG inhalation capsule Place 1 capsule (18 mcg total)  into inhaler and inhale daily. 30 capsule 12  . albuterol (PROVENTIL) (2.5 MG/3ML) 0.083% nebulizer solution Take 3 mLs (2.5 mg total) by nebulization every 4 (four) hours as needed for wheezing or shortness of breath. (Patient not taking: Reported on 06/23/2015) 75 mL 12   No current facility-administered medications for this visit.    Review of Systems:  GENERAL:  Feels "ok".  No fevers, sweats or weight loss. PERFORMANCE STATUS (ECOG):  3 HEENT:  Poor vision.  No runny nose, sore throat, mouth sores or tenderness. Lungs: Periodic shortness of breath.  No cough.  No hemoptysis. Cardiac:  No chest pain, palpitations, orthopnea, or PND. GI:  Diarrhea, constipation.  Dark stools.  No abdominal pain.  No nausea, vomiting, or hematochezia. GU:  No urgency, frequency, dysuria, or hematuria. Musculoskeletal:  No back pain.  No joint pain.  No muscle tenderness. Extremities:  No pain or swelling. Skin:  No rashes or skin changes. Neuro:  No headache, numbness or weakness, balance or coordination issues. Endocrine:  No diabetes, thyroid issues, hot flashes or night sweats. Psych:  No mood  changes, depression or anxiety. Pain:  No focal pain. Review of systems:  All other systems reviewed and found to be negative.  Physical Exam: Blood pressure 122/67, pulse 92, temperature 97.5 F (36.4 C), temperature source Oral, weight 203 lb (92.08 kg), SpO2 95 %. GENERAL:  Well developed, well nourished, sitting comfortably in a wheelchair in the exam room in no acute distress. MENTAL STATUS:  Alert and oriented to person, place and time. HEAD:  Pearline Cables hair with gray goatee.  Normocephalic, atraumatic, face symmetric, no Cushingoid features. EYES:  Dark rimmed glasses.  Brown eyes.  Pupils equal round and reactive to light and accomodation.  No conjunctivitis or scleral icterus. ENT:  Oropharynx clear without lesion.  Missing teeth.  Tongue normal. Mucous membranes moist.  RESPIRATORY:  Clear to auscultation without rales, wheezes or rhonchi. CARDIOVASCULAR:  Regular rate and rhythm without murmur, rub or gallop. ABDOMEN:  Soft, non-tender, with active bowel sounds, and no hepatosplenomegaly.  No masses. SKIN:  No rashes, ulcers or lesions. EXTREMITIES: No edema, no skin discoloration or tenderness.  No palpable cords. LYMPH NODES: No palpable cervical, supraclavicular, axillary or inguinal adenopathy  NEUROLOGICAL: Unremarkable. PSYCH:  Appropriate.  No visits with results within 3 Day(s) from this visit. Latest known visit with results is:  Admission on 06/06/2015, Discharged on 06/10/2015  Component Date Value Ref Range Status  . WBC 06/06/2015 13.3* 3.8 - 10.6 K/uL Final  . RBC 06/06/2015 2.22* 4.40 - 5.90 MIL/uL Final  . Hemoglobin 06/06/2015 6.4* 13.0 - 18.0 g/dL Final  . HCT 06/06/2015 20.5* 40.0 - 52.0 % Final  . MCV 06/06/2015 92.4  80.0 - 100.0 fL Final  . MCH 06/06/2015 28.8  26.0 - 34.0 pg Final  . MCHC 06/06/2015 31.1* 32.0 - 36.0 g/dL Final  . RDW 06/06/2015 17.1* 11.5 - 14.5 % Final  . Platelets 06/06/2015 287  150 - 440 K/uL Final  . Sodium 06/06/2015 142  135 -  145 mmol/L Final  . Potassium 06/06/2015 4.1  3.5 - 5.1 mmol/L Final  . Chloride 06/06/2015 100* 101 - 111 mmol/L Final  . CO2 06/06/2015 29  22 - 32 mmol/L Final  . Glucose, Bld 06/06/2015 125* 65 - 99 mg/dL Final  . BUN 06/06/2015 40* 6 - 20 mg/dL Final  . Creatinine, Ser 06/06/2015 4.59* 0.61 - 1.24 mg/dL Final  . Calcium 06/06/2015 8.5* 8.9 - 10.3 mg/dL  Final  . Total Protein 06/06/2015 6.6  6.5 - 8.1 g/dL Final  . Albumin 06/06/2015 2.8* 3.5 - 5.0 g/dL Final  . AST 06/06/2015 21  15 - 41 U/L Final  . ALT 06/06/2015 8* 17 - 63 U/L Final  . Alkaline Phosphatase 06/06/2015 82  38 - 126 U/L Final  . Total Bilirubin 06/06/2015 1.0  0.3 - 1.2 mg/dL Final  . GFR calc non Af Amer 06/06/2015 11* >60 mL/min Final  . GFR calc Af Amer 06/06/2015 13* >60 mL/min Final   Comment: (NOTE) The eGFR has been calculated using the CKD EPI equation. This calculation has not been validated in all clinical situations. eGFR's persistently <60 mL/min signify possible Chronic Kidney Disease.   . Anion gap 06/06/2015 13  5 - 15 Final  . Troponin I 06/06/2015 0.03  <0.031 ng/mL Final   Comment:        NO INDICATION OF MYOCARDIAL INJURY.   . Troponin I 06/06/2015 0.05* <0.031 ng/mL Final   Comment: READ BACK AND VERIFIED WITH SHARITA YELLOCK ON 06/06/15 AT 60PM BY TB.        PERSISTENTLY INCREASED TROPONIN VALUES IN THE RANGE OF 0.04-0.49 ng/mL CAN BE SEEN IN:       -UNSTABLE ANGINA       -CONGESTIVE HEART FAILURE       -MYOCARDITIS       -CHEST TRAUMA       -ARRYHTHMIAS       -LATE PRESENTING MYOCARDIAL INFARCTION       -COPD   CLINICAL FOLLOW-UP RECOMMENDED.   Marland Kitchen Troponin I 06/06/2015 0.06* <0.031 ng/mL Final   Comment: READ BACK AND VERIFIED WITH CAROLYN SONBY ON 06/06/15 AT 2001PM BY TB.        PERSISTENTLY INCREASED TROPONIN VALUES IN THE RANGE OF 0.04-0.49 ng/mL CAN BE SEEN IN:       -UNSTABLE ANGINA       -CONGESTIVE HEART FAILURE       -MYOCARDITIS       -CHEST TRAUMA        -ARRYHTHMIAS       -LATE PRESENTING MYOCARDIAL INFARCTION       -COPD   CLINICAL FOLLOW-UP RECOMMENDED.   Marland Kitchen Troponin I 06/07/2015 0.04* <0.031 ng/mL Final   Comment: READ BACK AND VERIFIED CYNTHIA BURGESS 06/07/2015  [address]        PERSISTENTLY INCREASED TROPONIN VALUES IN THE RANGE OF 0.04-0.49 ng/mL CAN BE SEEN IN:       -UNSTABLE ANGINA       -CONGESTIVE HEART FAILURE       -MYOCARDITIS       -CHEST TRAUMA       -ARRYHTHMIAS       -LATE PRESENTING MYOCARDIAL INFARCTION       -COPD   CLINICAL FOLLOW-UP RECOMMENDED.   . ABO/RH(D) 06/06/2015 A NEG   Final  . Antibody Screen 06/06/2015 NEG   Final  . Sample Expiration 06/06/2015 06/09/2015   Final  . Unit Number 06/06/2015 [id number]   Final  . Blood Component Type 06/06/2015 RED CELLS,LR   Final  . Unit division 06/06/2015 00   Final  . Status of Unit 06/06/2015 ISSUED,FINAL   Final  . Transfusion Status 06/06/2015 OK TO TRANSFUSE   Final  . Crossmatch Result 06/06/2015 Compatible   Final  . Unit Number 06/06/2015 [id number]   Final  . Blood Component Type 06/06/2015 RBC LR PHER2   Final  . Unit division 06/06/2015 00   Final  .  Status of Unit 06/06/2015 ISSUED,FINAL   Final  . Transfusion Status 06/06/2015 OK TO TRANSFUSE   Final  . Crossmatch Result 06/06/2015 Compatible   Final  . Order Confirmation 06/06/2015 ORDER PROCESSED BY BLOOD BANK   Final  . Hepatitis B Surface Ag 06/06/2015 Negative  Negative Final   Comment: (NOTE) Performed At: [facility] [place], [place] [id number] Lindon Romp MD LK:[id number]   . Hepatitis B-Post 06/06/2015 <3.1* Immunity>9.9 mIU/mL Final   Comment: (NOTE)  Status of Immunity                     Anti-HBs Level  ------------------                     -------------- Inconsistent with Immunity                   0.0 - 9.9 Consistent with Immunity                          >9.9 Performed At: [facility] [place], [place]  [id number] Lindon Romp MD QV:[id number]   . Hemoglobin 06/07/2015 7.9* 13.0 - 18.0 g/dL Final  . HCT 06/07/2015 24.2* 40.0 - 52.0 % Final  . Troponin I 06/07/2015 0.03  <0.031 ng/mL Final   Comment:        NO INDICATION OF MYOCARDIAL INJURY.   . Phosphorus 06/08/2015 2.7  2.5 - 4.6 mg/dL Final  . MRSA by PCR 06/07/2015 NEGATIVE  NEGATIVE Final   Comment:        The GeneXpert MRSA Assay (FDA approved for NASAL specimens only), is one component of a comprehensive MRSA colonization surveillance program. It is not intended to diagnose MRSA infection nor to guide or monitor treatment for MRSA infections.   . WBC 06/08/2015 9.3  3.8 - 10.6 K/uL Final  . RBC 06/08/2015 2.26* 4.40 - 5.90 MIL/uL Final  . Hemoglobin 06/08/2015 6.7* 13.0 - 18.0 g/dL Final  . HCT 06/08/2015 20.2* 40.0 - 52.0 % Final  . MCV 06/08/2015 89.5  80.0 - 100.0 fL Final  . MCH 06/08/2015 29.5  26.0 - 34.0 pg Final  . MCHC 06/08/2015 33.0  32.0 - 36.0 g/dL Final  . RDW 06/08/2015 17.5* 11.5 - 14.5 % Final  . Platelets 06/08/2015 218  150 - 440 K/uL Final  . Neutrophils Relative % 06/08/2015 89   Final  . Neutro Abs 06/08/2015 8.2* 1.4 - 6.5 K/uL Final  . Lymphocytes Relative 06/08/2015 6   Final  . Lymphs Abs 06/08/2015 0.6* 1.0 - 3.6 K/uL Final  . Monocytes Relative 06/08/2015 5   Final  . Monocytes Absolute 06/08/2015 0.5  0.2 - 1.0 K/uL Final  . Eosinophils Relative 06/08/2015 0   Final  . Eosinophils Absolute 06/08/2015 0.0  0 - 0.7 K/uL Final  . Basophils Relative 06/08/2015 0   Final  . Basophils Absolute 06/08/2015 0.0  0 - 0.1 K/uL Final  . LDH 06/08/2015 203* 98 - 192 U/L Final  . Haptoglobin 06/08/2015 225* 34 - 200 mg/dL Final   Comment: (NOTE) Performed At: [facility] [place], [place] [id number] Lindon Romp MD YS:[id number]   . Order Confirmation 06/06/2015 ORDER PROCESSED BY BLOOD BANK   Final  . Sodium 06/08/2015 138  135 - 145 mmol/L Final  . Potassium  06/08/2015 4.9  3.5 - 5.1 mmol/L Final  . Chloride  06/08/2015 96* 101 - 111 mmol/L Final  . CO2 06/08/2015 28  22 - 32 mmol/L Final  . Glucose, Bld 06/08/2015 156* 65 - 99 mg/dL Final  . BUN 06/08/2015 55* 6 - 20 mg/dL Final  . Creatinine, Ser 06/08/2015 4.45* 0.61 - 1.24 mg/dL Final  . Calcium 06/08/2015 8.6* 8.9 - 10.3 mg/dL Final  . Phosphorus 06/08/2015 3.1  2.5 - 4.6 mg/dL Final  . Albumin 06/08/2015 2.8* 3.5 - 5.0 g/dL Final  . GFR calc non Af Amer 06/08/2015 12* >60 mL/min Final  . GFR calc Af Amer 06/08/2015 14* >60 mL/min Final   Comment: (NOTE) The eGFR has been calculated using the CKD EPI equation. This calculation has not been validated in all clinical situations. eGFR's persistently <60 mL/min signify possible Chronic Kidney Disease.   . Anion gap 06/08/2015 14  5 - 15 Final  . Ferritin 06/08/2015 6660* 24 - 336 ng/mL Final  . Sed Rate 06/08/2015 78* 0 - 16 mm/hr Final  . Folate, Hemolysate 06/08/2015 439.7  Not Estab. ng/mL Final  . Hematocrit 06/08/2015 20.0* 37.5 - 51.0 % Final  . Folate, RBC 06/08/2015 2199  >498 ng/mL Final   Comment: (NOTE) Performed At: [facility] [place], [place] [id number] Lindon Romp MD YO:[id number]   . Retic Ct Pct 06/08/2015 3.4* 0.4 - 3.1 % Final  . RBC. 06/08/2015 2.30* 4.40 - 5.90 MIL/uL Final  . Retic Count, Manual 06/08/2015 78.2  19.0 - 183.0 K/uL Final  . TSH 06/08/2015 1.070  0.350 - 4.500 uIU/mL Final  . Anit Nuclear Antibody(ANA) 06/08/2015 Negative  Negative Final   Comment: (NOTE) Performed At: [facility] [place], [place] [id number] Lindon Romp MD YE:[id number]   . PSA 06/08/2015 1.44  0.00 - 4.00 ng/mL Final   Comment: (NOTE) While PSA levels of <=4.0 ng/ml are reported as reference range, some men with levels below 4.0 ng/ml can have prostate cancer and many men with PSA above 4.0 ng/ml do not have prostate cancer.  Other tests such as free PSA, age specific  reference ranges, PSA velocity and PSA doubling time may be helpful especially in men less than 39 years old. Performed at [facility]   . WBC 06/09/2015 10.6  3.8 - 10.6 K/uL Final  . RBC 06/09/2015 2.70* 4.40 - 5.90 MIL/uL Final  . Hemoglobin 06/09/2015 8.0* 13.0 - 18.0 g/dL Final  . HCT 06/09/2015 24.4* 40.0 - 52.0 % Final  . MCV 06/09/2015 90.3  80.0 - 100.0 fL Final  . MCH 06/09/2015 29.6  26.0 - 34.0 pg Final  . MCHC 06/09/2015 32.8  32.0 - 36.0 g/dL Final  . RDW 06/09/2015 17.0* 11.5 - 14.5 % Final  . Platelets 06/09/2015 170  150 - 440 K/uL Final  . Neutrophils Relative % 06/09/2015 70   Final  . Neutro Abs 06/09/2015 7.5* 1.4 - 6.5 K/uL Final  . Lymphocytes Relative 06/09/2015 20   Final  . Lymphs Abs 06/09/2015 2.1  1.0 - 3.6 K/uL Final  . Monocytes Relative 06/09/2015 8   Final  . Monocytes Absolute 06/09/2015 0.8  0.2 - 1.0 K/uL Final  . Eosinophils Relative 06/09/2015 2   Final  . Eosinophils Absolute 06/09/2015 0.2  0 - 0.7 K/uL Final  . Basophils Relative 06/09/2015 0   Final  . Basophils Absolute 06/09/2015 0.0  0 - 0.1 K/uL Final  . Fecal Occult Bld 06/09/2015 NEGATIVE  NEGATIVE Final  . Sodium 06/10/2015 139  135 - 145 mmol/L Final  . Potassium 06/10/2015 5.4* 3.5 - 5.1 mmol/L Final  . Chloride 06/10/2015 97* 101 - 111 mmol/L Final  . CO2 06/10/2015 29  22 - 32 mmol/L Final  . Glucose, Bld 06/10/2015 131* 65 - 99 mg/dL Final  . BUN 06/10/2015 74* 6 - 20 mg/dL Final  . Creatinine, Ser 06/10/2015 3.91* 0.61 - 1.24 mg/dL Final  . Calcium 06/10/2015 8.4* 8.9 - 10.3 mg/dL Final  . Phosphorus 06/10/2015 2.2* 2.5 - 4.6 mg/dL Final  . Albumin 06/10/2015 3.0* 3.5 - 5.0 g/dL Final  . GFR calc non Af Amer 06/10/2015 14* >60 mL/min Final  . GFR calc Af Amer 06/10/2015 16* >60 mL/min Final   Comment: (NOTE) The eGFR has been calculated using the CKD EPI equation. This calculation has not been validated in all clinical situations. eGFR's persistently <60 mL/min  signify possible Chronic Kidney Disease.   . Anion gap 06/10/2015 13  5 - 15 Final  . WBC 06/10/2015 11.8* 3.8 - 10.6 K/uL Final  . RBC 06/10/2015 2.58* 4.40 - 5.90 MIL/uL Final  . Hemoglobin 06/10/2015 7.5* 13.0 - 18.0 g/dL Final  . HCT 06/10/2015 23.3* 40.0 - 52.0 % Final  . MCV 06/10/2015 90.2  80.0 - 100.0 fL Final  . MCH 06/10/2015 29.2  26.0 - 34.0 pg Final  . MCHC 06/10/2015 32.3  32.0 - 36.0 g/dL Final  . RDW 06/10/2015 16.5* 11.5 - 14.5 % Final  . Platelets 06/10/2015 146* 150 - 440 K/uL Final  Outside labs from the dialysis center on 06/20/2015 revealed a hematocrit of 23.4 and hemoglobin of 7.8   Assessment:  [name] is a 74 y.o. male with end stage renal disease on dialysis since 06/2014.  The etiology of his renal disease is unclear.  He has stage IV prostate cancer.  PET scan on 06/2011 had revealed lymphadenopathy with sclerotic bone metastasis consistent with probable prostate cancer. There was bilateral hydronephrosis with bladder base thickening from probable prostate ingrowth. Bone scan 07/30/2011 revealed multiple areas consistent with metastatic disease.  He receives Lupron every 6 months.  PSA was 12 on 07/23/2011, 2.15 on 01/30/2015, and 1.44 on 06/08/2015.  He has a history of bladder cancer and is status post transurethral resection of bladder tumor (TURBT). He has stents in place.  He denies any hematuria.  He had a colonoscopy in 2012 which was negative. He is not felt to be a good candidate for sedation (further endoscopies) secondary to his multiple morbidities.  Labs on 06/07/2014 revealed a ferritin of 6660 (prior value of 838 on 05/04/2015). Sedimentation rate was 80. Folate was 20.  Reticulocyte count was 3.4%, TSH was 1.07, ANA was negative.  Haptoglobin was 225 (normal), LDH 203 (98-192).  B12 was normal on 05/04/2015.  He was admitted to [facility] from 06/06/2015 - 06/10/2015 with symptomatic anemia. Hematocrit was 20.5 and hemoglobin of 6.4. He  received 2 units of packed red blood cells.  CT angiogram of the pelvis on 06/09/2015 revealed a stable endovascular repair of an infrarenal abdominal aortic aneurysm with persistent findings of the type II endoleak supplied by the IMA with continued slight interval enlargement of the native abdominal aortic aneurysm sac (1-2 mm).  Hematocrit was 23.3 and hemoglobin 7.5 at discharge.   The etiology of his anemia is likely felt multifactorial. He has anemia of chronic renal disease.  By his history, he is now receiving Procrit. He may have some component of GI blood loss.  Diet is  good. He denies any melena or hematochezia.  Hemoglobin was 7.8 on 06/20/2015 (stable to slightly improved).  Plan: 1. Review labs and evaluation in the hospital. 2. Discuss continued monitoring of HCT in dialysis.  Continue Procrit. 3. Nursing to contact Dr. Keturah Barre office- done. 4. If anemia remains problematic, check [place]/DAT, SPEP, FLCA. 5. RTC prn.   Lequita Asal, MD  06/23/2015, 11:44 AM

## 2015-06-25 ENCOUNTER — Encounter: Payer: Self-pay | Admitting: Hematology and Oncology

## 2015-06-26 ENCOUNTER — Other Ambulatory Visit: Payer: Self-pay | Admitting: Gastroenterology

## 2015-06-26 DIAGNOSIS — D509 Iron deficiency anemia, unspecified: Secondary | ICD-10-CM

## 2015-06-30 ENCOUNTER — Ambulatory Visit
Admission: RE | Admit: 2015-06-30 | Discharge: 2015-06-30 | Disposition: A | Discharge status: Home / Self Care | Payer: Medicare Other | Source: Ambulatory Visit | Attending: Gastroenterology | Admitting: Gastroenterology

## 2015-06-30 DIAGNOSIS — M160 Bilateral primary osteoarthritis of hip: Secondary | ICD-10-CM | POA: Diagnosis not present

## 2015-06-30 DIAGNOSIS — D509 Iron deficiency anemia, unspecified: Secondary | ICD-10-CM

## 2015-07-03 ENCOUNTER — Ambulatory Visit: Payer: Medicare Other | Attending: Family Medicine | Admitting: Physical Therapy

## 2015-07-03 DIAGNOSIS — Z7409 Other reduced mobility: Secondary | ICD-10-CM | POA: Insufficient documentation

## 2015-07-03 DIAGNOSIS — R29898 Other symptoms and signs involving the musculoskeletal system: Secondary | ICD-10-CM | POA: Diagnosis present

## 2015-07-03 NOTE — Patient Instructions (Signed)
Bridge    Lie back, legs bent. Inhale, pressing hips up. Keeping ribs in, lengthen lower back. Exhale, rolling down along spine from top. Repeat __8__ times. Do _2___ sessions per day.  http://pm.exer.us/54   Copyright  VHI. All rights reserved.  Clam Shell 45 Degrees    Lying with hips and knees bent 45, one pillow between knees and ankles. Lift knee. Be sure pelvis does not roll backward. Do not arch back. Do __10_ times, each leg, _2__ times per day.  http://ss.exer.us/74   Copyright  VHI. All rights reserved.  KNEE: Extension (Isometric)    Lie on back, legs straight. Tighten quadriceps by pushing back of knee into surface. Hold _3__ seconds. _10__ reps per set, _2__ sets per day, _5__ days per week   Copyright  VHI. All rights reserved.

## 2015-07-04 ENCOUNTER — Encounter: Payer: Self-pay | Admitting: Physical Therapy
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
## 2015-07-10 ENCOUNTER — Encounter: Payer: Self-pay | Admitting: Physical Therapy

## 2015-07-10 ENCOUNTER — Ambulatory Visit: Payer: Medicare Other | Admitting: Physical Therapy

## 2015-07-10 DIAGNOSIS — Z7409 Other reduced mobility: Secondary | ICD-10-CM

## 2015-07-10 DIAGNOSIS — R29898 Other symptoms and signs involving the musculoskeletal system: Secondary | ICD-10-CM
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
## 2015-07-12 ENCOUNTER — Encounter: Payer: Self-pay | Admitting: Physical Therapy

## 2015-07-12 ENCOUNTER — Ambulatory Visit: Payer: Medicare Other | Admitting: Physical Therapy

## 2015-07-12 DIAGNOSIS — R29898 Other symptoms and signs involving the musculoskeletal system: Secondary | ICD-10-CM

## 2015-07-12 DIAGNOSIS — Z7409 Other reduced mobility: Secondary | ICD-10-CM
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
## 2015-07-17 ENCOUNTER — Encounter: Payer: Self-pay | Admitting: Physical Therapy

## 2015-07-17 ENCOUNTER — Ambulatory Visit: Payer: Medicare Other | Admitting: Physical Therapy

## 2015-07-17 DIAGNOSIS — R29898 Other symptoms and signs involving the musculoskeletal system: Secondary | ICD-10-CM | POA: Diagnosis not present

## 2015-07-17 DIAGNOSIS — Z7409 Other reduced mobility: Secondary | ICD-10-CM

## 2015-07-17 NOTE — Therapy (Signed)
[place] MAIN [facility] SERVICES [address] [place], [place], [address] Phone: [phone]   Fax:  [phone]  Physical Therapy Treatment  Patient Details  Name: [name] MRN: [id number] Date of Birth: 1940-12-23 No Data Recorded  Encounter Date: 07/17/2015      PT End of Session - 07/17/15 0843    Visit Number 2   Number of Visits 25   Date for PT Re-Evaluation 09/25/15   Authorization Type 2   Authorization Time Period 10   Equipment Utilized During Treatment Gait belt   Activity Tolerance Patient tolerated treatment well;Patient limited by fatigue   Behavior During Therapy [facility] for tasks assessed/performed      Past Medical History  Diagnosis Date  . PAF (paroxysmal atrial fibrillation) ([place])     a. new onset 12/2014 in the setting of UTI, sepsis, hypotension, and anemia; b. not on long term anticoagulation given anemia; c. family aware of stroke risk, they are ok with this; d. on amiodarone   . Chronic combined systolic and diastolic CHF (congestive heart failure) ([place])     a. echo 12/2014: EF 25-30%, anterior wall wall motion abnormalities; b. planned ischemic evaluation once patient is stable medically; c. echo 01/2015: EF 45-50%, no RWMA, mild AT, LA mildly dilated, mod pericardial effusion along LV free wall, no evidence of hemodynamic compromise  . ESRD on hemodialysis ([place])     a. Tuesday, Thursday, and Saturdays  . Anemia     a. baseline hgb ~ 8  . History of small bowel obstruction     a. 01/2015  . History of septic shock     a. 01/2015  . GERD (gastroesophageal reflux disease)   . Allergic rhinitis   . COPD (chronic obstructive pulmonary disease) ([place])   . Hypercholesteremia   . Cognitive communication deficit   . Magnesium deficiency   . Dementia   . 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 Incontinence   . Hydronephrosis   . Overactive bladder   . Detrusor sphincter dyssynergia   . Mini stroke ([place])   . Bladder cancer  ([place])   . Prostate cancer ([place])   . Renal insufficiency   . Hypertension     Past Surgical History  Procedure Laterality Date  . Cystoscopy w/ ureteral stent placement    . Transurethral resection of bladder tumor with gyrus (turbt-gyrus)    . Ureteral stent placement    . Prostate surgery      Removal  . Av fistula placement      left arm    There were no vitals filed for this visit.  Visit Diagnosis:  Weakness of both legs  Decreased mobility and endurance      Subjective Assessment - 07/17/15 0840    Subjective Patient is SOB during sliding board transfer from wc to mat table.         Therapeutic activities:   Transfer from wc to mat with sliding board and CGA and mat to wc with CGA using sliding board. Rolling left and right with SBA and decreased O2 sat level of 88-90 % and then after several minutes increasing to 91 % Stretching of BLE hamstrings in supine x 30 sec x 3  Bridges x 20 , unable to raise up off the mat SAQ BLE over large bolster x 20 x 2 , limited by weakness and decreased ROM of knee extension Sit to stand x 2 attempts but unable with max assist x 1 in  parallel bars                           PT Education - 07/17/15 0842    Education provided Yes   Education Details O2 saturation levels with exertion .   Person(s) Educated Patient   Methods Explanation   Comprehension Verbalized understanding             PT Long Term Goals - 07/04/15 0732    PT LONG TERM GOAL #1   Title Patient will be independent with HEP to improve flexibility and LE strength to improve independence within the home by 09/25/15   Time 12   Period Weeks   Status New   PT LONG TERM GOAL #2   Title Patient will be able to complete 5xsit to stand with use of UE in less 30seconds to indicate improved functional capacity at home by 09/25/15   Time 12   Period Weeks   Status New   PT LONG TERM GOAL #3   Title Patient will be able to ambulate 50 ft with  RW to improve access to home environment by 09/25/15    Time 12   Period Weeks   Status New   PT LONG TERM GOAL #4   Title Patient will improve muscle strength to at least 4+/5 to allow him to perform all transfer at modified independent level and be able to walk within home by 09/25/15   Time [place] - 07/17/15 0843    Clinical Impression Statement Patietn has SOB during mobility training and stretching of LE's. He has difficulty with all mobility due to weakness and SOB.    Pt will benefit from skilled therapeutic intervention in order to improve on the following deficits Decreased activity tolerance;Decreased balance;Decreased endurance;Decreased mobility;Decreased range of motion;Decreased safety awareness;Decreased strength;Difficulty walking;Hypomobility;Increased fascial restricitons;Impaired perceived functional ability;Impaired flexibility;Improper body mechanics;Postural dysfunction   Rehab Potential Fair   Clinical Impairments Affecting Rehab Potential Positive: Patient is motivated to improve, family support. Negative: chronic weakness, muscle contractures, comorbidities.    PT Frequency 2x / week   PT Duration 12 weeks   PT Treatment/Interventions ADLs/Self Care Home Management;Aquatic Therapy;Cryotherapy;Moist Heat;Ultrasound;DME Instruction;Gait training;Stair training;Functional mobility training;Therapeutic activities;Therapeutic exercise;Balance training;Neuromuscular re-education;Patient/family education;Wheelchair mobility training;Manual techniques;Passive range of motion;Dry needling;Energy conservation   PT Next Visit Plan seated balance assessment, supine/seated therex.    PT Home Exercise Plan see patient instrucitons.    Consulted and Agree with Plan of Care Patient        Problem List Patient Active Problem List   Diagnosis Date Noted  . Generalized weakness 05/08/2015  . ESRD on dialysis ([place]) 05/08/2015  .  Other emphysema ([place]) 05/08/2015  . Tobacco use disorder 05/08/2015  . Hypokalemia 05/08/2015  . Pressure ulcer 05/05/2015  . Symptomatic anemia 05/04/2015  . CAFL (chronic airflow limitation) ([place]) 04/10/2015  . Malnutrition of moderate degree ([place]) 03/22/2015  . Abdominal pain   . Pneumonia 03/20/2015  . History of sepsis 02/27/2015  . Incontinence 02/27/2015  . Persistent atrial fibrillation ([place]) 02/27/2015  . Preoperative cardiovascular examination 02/27/2015  . GI bleed 02/13/2015  . Ileus ([place])   . Chronic combined systolic and diastolic CHF (congestive heart failure) ([place])   . ESRD on hemodialysis ([place])   . Anemia   . History of small bowel obstruction   . Atrial  fibrillation with RVR ([place]) 01/21/2015  . Chronic kidney disease (CKD), stage IV (severe) ([place]) 03/01/2014  . Essential (primary) hypertension 03/01/2014  . BP (high blood pressure) 03/01/2014  . 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 Absence of bladder continence 03/01/2014  . Chronic kidney disease requiring chronic dialysis ([place]) 03/01/2014  . Bladder compliance low 11/11/2012  . Cystitis 11/11/2012  . Acute cystitis 10/15/2012  . AAA (abdominal aortic aneurysm) ([place]) 08/27/2012  . 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 Chronic kidney disease (CKD), stage V ([place]) 08/27/2012  . Hydronephrosis 08/27/2012  . Incomplete bladder emptying 08/27/2012  . Bladder cancer ([place]) 08/27/2012  . CA of prostate ([place]) 08/27/2012  . Bone metastases ([place]) 08/27/2012  . Infection of urinary tract 08/27/2012    Alanson Puls 07/17/2015, 8:47 AM  [place] MAIN [facility] SERVICES [address] [place], [place], [address] Phone: [phone]   Fax:  [phone]  Name: [name] MRN: [id number] Date of Birth: 1941/01/05

## 2015-07-18 ENCOUNTER — Emergency Department
Admission: EM | Admit: 2015-07-18 | Discharge: 2015-07-18 | Disposition: A | Discharge status: Home / Self Care | Payer: Medicare Other | Attending: Emergency Medicine | Admitting: Emergency Medicine

## 2015-07-18 ENCOUNTER — Encounter: Payer: Self-pay | Admitting: Emergency Medicine

## 2015-07-18 ENCOUNTER — Emergency Department: Payer: Medicare Other

## 2015-07-18 ENCOUNTER — Other Ambulatory Visit: Payer: Self-pay

## 2015-07-18 DIAGNOSIS — R0600 Dyspnea, unspecified: Secondary | ICD-10-CM | POA: Diagnosis not present

## 2015-07-18 DIAGNOSIS — I120 Hypertensive chronic kidney disease with stage 5 chronic kidney disease or end stage renal disease: Secondary | ICD-10-CM | POA: Insufficient documentation

## 2015-07-18 DIAGNOSIS — Z87891 Personal history of nicotine dependence: Secondary | ICD-10-CM | POA: Diagnosis not present

## 2015-07-18 DIAGNOSIS — N186 End stage renal disease: Secondary | ICD-10-CM | POA: Diagnosis not present

## 2015-07-18 DIAGNOSIS — R0602 Shortness of breath: Secondary | ICD-10-CM | POA: Diagnosis present

## 2015-07-18 DIAGNOSIS — Z880 Allergy status to penicillin: Secondary | ICD-10-CM | POA: Insufficient documentation

## 2015-07-18 DIAGNOSIS — R29898 Other symptoms and signs involving the musculoskeletal system: Secondary | ICD-10-CM | POA: Diagnosis not present

## 2015-07-18 LAB — CBC WITH DIFFERENTIAL/PLATELET
Basophils Absolute: 0 10*3/uL (ref 0–0.1)
Basophils Relative: 0 %
EOS ABS: 0.4 10*3/uL (ref 0–0.7)
Eosinophils Relative: 5 %
HEMATOCRIT: 25.1 % — AB (ref 40.0–52.0)
HEMOGLOBIN: 8 g/dL — AB (ref 13.0–18.0)
LYMPHS ABS: 0.9 10*3/uL — AB (ref 1.0–3.6)
LYMPHS PCT: 9 %
MCH: 29.4 pg (ref 26.0–34.0)
MCHC: 31.9 g/dL — AB (ref 32.0–36.0)
MCV: 92.2 fL (ref 80.0–100.0)
MONOS PCT: 7 %
Monocytes Absolute: 0.7 10*3/uL (ref 0.2–1.0)
NEUTROS ABS: 7.5 10*3/uL — AB (ref 1.4–6.5)
NEUTROS PCT: 79 %
Platelets: 205 10*3/uL (ref 150–440)
RBC: 2.73 MIL/uL — AB (ref 4.40–5.90)
RDW: 16.8 % — ABNORMAL HIGH (ref 11.5–14.5)
WBC: 9.5 10*3/uL (ref 3.8–10.6)

## 2015-07-18 LAB — BASIC METABOLIC PANEL
Anion gap: 12 (ref 5–15)
BUN: 64 mg/dL — AB (ref 6–20)
CHLORIDE: 102 mmol/L (ref 101–111)
CO2: 29 mmol/L (ref 22–32)
CREATININE: 4.58 mg/dL — AB (ref 0.61–1.24)
Calcium: 8.7 mg/dL — ABNORMAL LOW (ref 8.9–10.3)
GFR calc Af Amer: 13 mL/min — ABNORMAL LOW (ref >60)
GFR calc non Af Amer: 12 mL/min — ABNORMAL LOW (ref >60)
Glucose, Bld: 118 mg/dL — ABNORMAL HIGH (ref 65–99)
POTASSIUM: 4.9 mmol/L (ref 3.5–5.1)
SODIUM: 143 mmol/L (ref 135–145)

## 2015-07-18 LAB — TROPONIN I: TROPONIN I: 0.04 ng/mL — AB (ref <0.031)

## 2015-07-18 NOTE — Discharge Instructions (Signed)
Dialysis °Dialysis is a procedure that replaces some of the work healthy kidneys do. It is done when you lose about 85-90% of your kidney function. It may also be done earlier if your symptoms may be improved by dialysis. During dialysis, wastes, salt, and extra water are removed from the blood, and the levels of certain chemicals in the blood (such as potassium) are maintained. Dialysis is done in sessions. Dialysis sessions are continued until the kidneys get better. If the kidneys cannot get better, such as in end-stage kidney disease, dialysis is continued for life or until you receive a new kidney (kidney transplant). There are two types of dialysis: hemodialysis and peritoneal dialysis. °WHAT IS HEMODIALYSIS?  °Hemodialysis is a type of dialysis in which a machine called a dialyzer is used to filter the blood. Before beginning hemodialysis, you will have surgery to create a site where blood can be removed from the body and returned to the body (vascular access). There are three types of vascular accesses: °· Arteriovenous fistula. To create this type of access, an artery is connected to a vein (usually in the arm). A fistula takes 1-6 months to develop after surgery. If it develops properly, it usually lasts longer than the other types of vascular accesses. It is also less likely to become infected and cause blood clots. °· Arteriovenous graft. To create this type of access, an artery and a vein in the arm are connected with a tube. A graft may be used within 2-3 weeks of surgery. °· A venous catheter. To create this type of access, a thin, flexible tube (catheter) is placed in a large vein in your neck, chest, or groin. A catheter may be used right away. It is usually used as a temporary access when dialysis needs to begin immediately. °During hemodialysis, blood leaves the body through your access. It travels through a tube to the dialyzer, where it is filtered. The blood then returns to your body through  another tube. °Hemodialysis is usually performed by a health care provider at a hospital or dialysis center three times a week. Visits last about 3-4 hours. It may also be performed with the help of another person at home with training.  °WHAT IS PERITONEAL DIALYSIS? °Peritoneal dialysis is a type of dialysis in which the thin lining of the abdomen (peritoneum) is used as a filter. Before beginning peritoneal dialysis, you will have surgery to place a catheter in your abdomen. The catheter will be used to transfer a fluid called dialysate to and from your abdomen. At the start of a session, your abdomen is filled with dialysate. During the session, wastes, salt, and extra water in the blood pass through the peritoneum and into the dialysate. The dialysate is drained from the body at the end of the session. The process of filling and draining the dialysate is called an exchange. Exchanges are repeated until you have used up all the dialysate for the day. °Peritoneal dialysis may be performed by you at home or at almost any other location. It is done every day. You may need up to five exchanges a day. The amount of time the dialysate is in your body between exchanges is called a dwell. The dwell depends on the number of exchanges needed and the characteristics of the peritoneum. It usually varies from 1.5-3 hours. You may go about your day normally between exchanges. Alternately, the exchanges may be done at night while you sleep, using a machine called a cycler. °WHICH TYPE   OF DIALYSIS SHOULD I CHOOSE?  Both hemodialysis and peritoneal dialysis have advantages and disadvantages. Talk to your health care provider about which type of dialysis would be best for you. Your lifestyle and preferences should be considered along with your medical condition. In some cases, only one type of dialysis may be an option.  Advantages of hemodialysis  It is done less often than peritoneal dialysis.  Someone else can do the  dialysis for you.  If you go to a dialysis center, your health care provider will be able to recognize any problems right away.  If you go to a dialysis center, you can interact with others who are having dialysis. This can provide you with emotional support. Disadvantages of hemodialysis  Hemodialysis may cause cramps and low blood pressure. It may leave you feeling tired on the days you have the treatment.  If you go to a dialysis center, you will need to make weekly appointments and work around the center's schedule.  You will need to take extra care when traveling. If you go to a dialysis center, you will need to make special arrangements to visit a dialysis center near your destination. If you are having treatments at home, you will need to take the dialyzer with you to your destination.  You will need to avoid more foods than you would need to avoid on peritoneal dialysis. Advantages of peritoneal dialysis  It is less likely than hemodialysis to cause cramps and low blood pressure.  You may do exchanges on your own wherever you are, including when you travel.  You do not need to avoid as many foods as you do on hemodialysis. Disadvantages of peritoneal dialysis  It is done more often than hemodialysis.  Performing peritoneal dialysis requires you to have dexterity of the hands. You must also be able to lift bags.  You will have to learn sterilization techniques. You will need to practice them every day to reduce the risk of infection. WHAT CHANGES WILL I NEED TO MAKE TO MY DIET DURING DIALYSIS? Both hemodialysis and peritoneal dialysis require you to make some changes to your diet. For example, you will need to limit your intake of foods high in the minerals phosphorus and potassium. You will also need to limit your fluid intake. Your dietitian can help you plan meals. A good meal plan can improve your dialysis and your health.  WHAT SHOULD I EXPECT WHEN BEGINNING  DIALYSIS? Adjusting to the dialysis treatment, schedule, and diet can take some time. You may need to stop working and may not be able to do some of the things you normally do. You may feel anxious or depressed when beginning dialysis. Eventually, many people feel better overall because of dialysis. Some people are able to return to work after making some changes, such as reducing work intensity. WHERE CAN I FIND MORE INFORMATION?   [place]: www.kidney.org  American Association of Kidney Patients: BombTimer.gl  [employer]: www.kidneyfund.org   This information is not intended to replace advice given to you by your health care provider. Make sure you discuss any questions you have with your health care provider.   Document Released: 11/30/2002 Document Revised: 09/30/2014 Document Reviewed: 11/03/2012 Elsevier Interactive Patient Education 2016 [employer].  Shortness of Breath Shortness of breath means you have trouble breathing. It could also mean that you have a medical problem. You should get immediate medical care for shortness of breath. CAUSES   Not enough oxygen in the air  such as with high altitudes or a smoke-filled room.  Certain lung diseases, infections, or problems.  Heart disease or conditions, such as angina or heart failure.  Low red blood cells (anemia).  Poor physical fitness, which can cause shortness of breath when you exercise.  Chest or back injuries or stiffness.  Being overweight.  Smoking.  Anxiety, which can make you feel like you are not getting enough air. DIAGNOSIS  Serious medical problems can often be found during your physical exam. Tests may also be done to determine why you are having shortness of breath. Tests may include:  Chest X-rays.  Lung function tests.  Blood tests.  An electrocardiogram (ECG).  An ambulatory electrocardiogram. An ambulatory ECG records your heartbeat patterns over a 24-hour  period.  Exercise testing.  A transthoracic echocardiogram (TTE). During echocardiography, sound waves are used to evaluate how blood flows through your heart.  A transesophageal echocardiogram (TEE).  Imaging scans. Your health care provider may not be able to find a cause for your shortness of breath after your exam. In this case, it is important to have a follow-up exam with your health care provider as directed.  TREATMENT  Treatment for shortness of breath depends on the cause of your symptoms and can vary greatly. HOME CARE INSTRUCTIONS   Do not smoke. Smoking is a common cause of shortness of breath. If you smoke, ask for help to quit.  Avoid being around chemicals or things that may bother your breathing, such as paint fumes and dust.  Rest as needed. Slowly resume your usual activities.  If medicines were prescribed, take them as directed for the full length of time directed. This includes oxygen and any inhaled medicines.  Keep all follow-up appointments as directed by your health care provider. SEEK MEDICAL CARE IF:   Your condition does not improve in the time expected.  You have a hard time doing your normal activities even with rest.  You have any new symptoms. SEEK IMMEDIATE MEDICAL CARE IF:   Your shortness of breath gets worse.  You feel light-headed, faint, or develop a cough not controlled with medicines.  You start coughing up blood.  You have pain with breathing.  You have chest pain or pain in your arms, shoulders, or abdomen.  You have a fever.  You are unable to walk up stairs or exercise the way you normally do. MAKE SURE YOU:  Understand these instructions.  Will watch your condition.  Will get help right away if you are not doing well or get worse.   This information is not intended to replace advice given to you by your health care provider. Make sure you discuss any questions you have with your health care provider.   Document  Released: 06/04/2001 Document Revised: 09/14/2013 Document Reviewed: 11/25/2011 Elsevier Interactive Patient Education [employer].

## 2015-07-18 NOTE — ED Notes (Signed)
Pt had 2 large loose stools, denies abd pain. Dr Jimmye Norman aware.

## 2015-07-18 NOTE — Care Management Note (Signed)
I was contacted by Dr Jimmye Norman from the ED this morning seeking help in getting patient back on dialysis schedule this morning.  There is no medical reason for this admission if patient can get normal outpatient treatment today.  I spoke with Debbie at [facility] and she was able to give a treatment time of 11:30 today.  ED Dr and RN updated.  Iran Sizer [phone]

## 2015-07-18 NOTE — ED Notes (Signed)
Pt was at dialysis when he c/o sob. Upon arrival on RA, sats 88-92%. Pt appears in no distress, talking in full sentences. Denies pain.

## 2015-07-18 NOTE — ED Notes (Signed)
Pt resting in bed,no complaints

## 2015-07-18 NOTE — ED Provider Notes (Signed)
[facility] Emergency Department Provider Note     Time seen: ----------------------------------------- 7:07 AM on 07/18/2015 -----------------------------------------    I have reviewed the triage vital signs and the nursing notes.   HISTORY  Chief Complaint No chief complaint on file.    HPI [name] is a 74 y.o. male who presents ER for shortness of breath. Patient is due for dialysis this morning states he was sitting in the waiting room before dialysis and began feeling short of breath. Patient denies any fevers, chills, chest pain. The makes symptoms better or worse. His last dialysis episode on Saturday was normal.   Past Medical History  Diagnosis Date  . PAF (paroxysmal atrial fibrillation) ([place])     a. new onset 12/2014 in the setting of UTI, sepsis, hypotension, and anemia; b. not on long term anticoagulation given anemia; c. family aware of stroke risk, they are ok with this; d. on amiodarone   . Chronic combined systolic and diastolic CHF (congestive heart failure) ([place])     a. echo 12/2014: EF 25-30%, anterior wall wall motion abnormalities; b. planned ischemic evaluation once patient is stable medically; c. echo 01/2015: EF 45-50%, no RWMA, mild AT, LA mildly dilated, mod pericardial effusion along LV free wall, no evidence of hemodynamic compromise  . ESRD on hemodialysis ([place])     a. Tuesday, Thursday, and Saturdays  . Anemia     a. baseline hgb ~ 8  . History of small bowel obstruction     a. 01/2015  . History of septic shock     a. 01/2015  . GERD (gastroesophageal reflux disease)   . Allergic rhinitis   . COPD (chronic obstructive pulmonary disease) ([place])   . Hypercholesteremia   . Cognitive communication deficit   . Magnesium deficiency   . Dementia   . 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 Incontinence   . Hydronephrosis   . Overactive bladder   . Detrusor sphincter dyssynergia   . Mini stroke ([place])   . Bladder cancer ([place])    . Prostate cancer ([place])   . Renal insufficiency   . Hypertension     Patient Active Problem List   Diagnosis Date Noted  . Generalized weakness 05/08/2015  . ESRD on dialysis ([place]) 05/08/2015  . Other emphysema ([place]) 05/08/2015  . Tobacco use disorder 05/08/2015  . Hypokalemia 05/08/2015  . Pressure ulcer 05/05/2015  . Symptomatic anemia 05/04/2015  . CAFL (chronic airflow limitation) ([place]) 04/10/2015  . Malnutrition of moderate degree ([place]) 03/22/2015  . Abdominal pain   . Pneumonia 03/20/2015  . History of sepsis 02/27/2015  . Incontinence 02/27/2015  . Persistent atrial fibrillation ([place]) 02/27/2015  . Preoperative cardiovascular examination 02/27/2015  . GI bleed 02/13/2015  . Ileus ([place])   . Chronic combined systolic and diastolic CHF (congestive heart failure) ([place])   . ESRD on hemodialysis ([place])   . Anemia   . History of small bowel obstruction   . Atrial fibrillation with RVR ([place]) 01/21/2015  . Chronic kidney disease (CKD), stage IV (severe) ([place]) 03/01/2014  . Essential (primary) hypertension 03/01/2014  . BP (high blood pressure) 03/01/2014  . 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 Absence of bladder continence 03/01/2014  . Chronic kidney disease requiring chronic dialysis ([place]) 03/01/2014  . Bladder compliance low 11/11/2012  . Cystitis 11/11/2012  . Acute cystitis 10/15/2012  . AAA (abdominal aortic aneurysm) ([place]) 08/27/2012  . 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 Chronic kidney disease (CKD), stage V ([place])  08/27/2012  . Hydronephrosis 08/27/2012  . Incomplete bladder emptying 08/27/2012  . Bladder cancer ([place]) 08/27/2012  . CA of prostate ([place]) 08/27/2012  . Bone metastases ([place]) 08/27/2012  . Infection of urinary tract 08/27/2012    Past Surgical History  Procedure Laterality Date  . Cystoscopy w/ ureteral stent placement    . Transurethral resection of bladder tumor with gyrus (turbt-gyrus)    . Ureteral stent placement    . Prostate surgery       Removal  . Av fistula placement      left arm    Allergies Penicillins  Social History Social History  Substance Use Topics  . Smoking status: Former Smoker -- 0.25 packs/day for 88 years    Quit date: 05/13/2015  . Smokeless tobacco: Never Used  . Alcohol Use: No    Review of Systems Constitutional: Negative for fever. Eyes: Negative for visual changes. ENT: Negative for sore throat. Cardiovascular: Negative for chest pain. Respiratory: Positive for shortness of breath Gastrointestinal: Negative for abdominal pain, vomiting and diarrhea. Genitourinary: Negative for dysuria. Musculoskeletal: Negative for back pain. Skin: Negative for rash. Neurological: Negative for headaches, focal weakness or numbness.  10-point ROS otherwise negative.  ____________________________________________   PHYSICAL EXAM:  VITAL SIGNS: ED Triage Vitals  Enc Vitals Group     BP --      Pulse --      Resp --      Temp --      Temp src --      SpO2 --      Weight --      Height --      Head Cir --      Peak Flow --      Pain Score --      Pain Loc --      Pain Edu? --      Excl. in [place]? --     Constitutional: Alert and oriented. Well appearing and in no distress. Eyes: Conjunctivae are normal. PERRL. Normal extraocular movements. ENT   Head: Normocephalic and atraumatic.   Nose: No congestion/rhinnorhea.   Mouth/Throat: Mucous membranes are moist.   Neck: No stridor. Cardiovascular: Normal rate, regular rhythm. Normal and symmetric distal pulses are present in all extremities. No murmurs, rubs, or gallops. Respiratory: Normal respiratory effort without tachypnea nor retractions. Breath sounds are clear and equal bilaterally. No wheezes/rales/rhonchi. Gastrointestinal: Soft and nontender. No distention. No abdominal bruits.  Musculoskeletal: Nontender with normal range of motion in all extremities. No joint effusions.  No lower extremity tenderness nor  edema. Neurologic:  Normal speech and language. No gross focal neurologic deficits are appreciated. Speech is normal. No gait instability. Skin:  Skin is warm, dry and intact. No rash noted. Psychiatric: Mood and affect are normal. Speech and behavior are normal. Patient exhibits appropriate insight and judgment. ____________________________________________  EKG: Interpreted by me. Normal sinus rhythm rate of 88 bpm, normal PR interval, normal QRS with, borderline long QT interval. No evidence of acute infarction  ____________________________________________  ED COURSE:  Pertinent labs & imaging results that were available during my care of the patient were reviewed by me and considered in my medical decision making (see chart for details). Patient likely just needs dialysis, we'll check labs and x-ray. ____________________________________________    LABS (pertinent positives/negatives)  Labs Reviewed  CBC WITH DIFFERENTIAL/PLATELET - Abnormal; Notable for the following:    RBC 2.73 (*)    Hemoglobin 8.0 (*)    HCT 25.1 (*)  MCHC 31.9 (*)    RDW 16.8 (*)    Neutro Abs 7.5 (*)    Lymphs Abs 0.9 (*)    All other components within normal limits  BASIC METABOLIC PANEL - Abnormal; Notable for the following:    Glucose, Bld 118 (*)    BUN 64 (*)    Creatinine, Ser 4.58 (*)    Calcium 8.7 (*)    GFR calc non Af Amer 12 (*)    GFR calc Af Amer 13 (*)    All other components within normal limits  TROPONIN I - Abnormal; Notable for the following:    Troponin I 0.04 (*)    All other components within normal limits    RADIOLOGY Images were viewed by me  Chest x-ray  IMPRESSION: Persistent right pleural effusion. Bibasilar atelectasis. No frank airspace consolidation. Stable cardiomegaly. Stable aortic prominence as documented previously. ____________________________________________  FINAL ASSESSMENT AND PLAN  Dyspnea, end-stage renal disease  Plan: Patient with labs  and imaging as dictated above. Patient is pending dialysis this morning. I have arranged for the patient to go back to dialysis. Did not see any other acute issues at this time. He stable for discharge back to dialysis.   Earleen Newport, MD   Earleen Newport, MD 07/18/15 [phone]

## 2015-07-19 ENCOUNTER — Ambulatory Visit: Payer: Medicare Other | Admitting: Physical Therapy

## 2015-07-23 ENCOUNTER — Emergency Department: Payer: Medicare Other

## 2015-07-23 ENCOUNTER — Inpatient Hospital Stay
Admission: EM | Admit: 2015-07-23 | Discharge: 2015-07-24 | DRG: 189 | Disposition: A | Discharge status: Home / Self Care | Payer: Medicare Other | Attending: Internal Medicine | Admitting: Internal Medicine

## 2015-07-23 ENCOUNTER — Encounter: Payer: Self-pay | Admitting: Emergency Medicine

## 2015-07-23 DIAGNOSIS — K219 Gastro-esophageal reflux disease without esophagitis: Secondary | ICD-10-CM | POA: Diagnosis present

## 2015-07-23 DIAGNOSIS — Z880 Allergy status to penicillin: Secondary | ICD-10-CM

## 2015-07-23 DIAGNOSIS — Z823 Family history of stroke: Secondary | ICD-10-CM | POA: Diagnosis not present

## 2015-07-23 DIAGNOSIS — I132 Hypertensive heart and chronic kidney disease with heart failure and with stage 5 chronic kidney disease, or end stage renal disease: Secondary | ICD-10-CM | POA: Diagnosis present

## 2015-07-23 DIAGNOSIS — Z992 Dependence on renal dialysis: Secondary | ICD-10-CM | POA: Diagnosis not present

## 2015-07-23 DIAGNOSIS — Z8673 Personal history of transient ischemic attack (TIA), and cerebral infarction without residual deficits: Secondary | ICD-10-CM

## 2015-07-23 DIAGNOSIS — I480 Paroxysmal atrial fibrillation: Secondary | ICD-10-CM | POA: Diagnosis present

## 2015-07-23 DIAGNOSIS — Z8546 Personal history of malignant neoplasm of prostate: Secondary | ICD-10-CM

## 2015-07-23 DIAGNOSIS — J449 Chronic obstructive pulmonary disease, unspecified: Secondary | ICD-10-CM | POA: Diagnosis present

## 2015-07-23 DIAGNOSIS — N2581 Secondary hyperparathyroidism of renal origin: Secondary | ICD-10-CM | POA: Diagnosis present

## 2015-07-23 DIAGNOSIS — F0390 Unspecified dementia without behavioral disturbance: Secondary | ICD-10-CM | POA: Diagnosis present

## 2015-07-23 DIAGNOSIS — Z79899 Other long term (current) drug therapy: Secondary | ICD-10-CM

## 2015-07-23 DIAGNOSIS — Z9981 Dependence on supplemental oxygen: Secondary | ICD-10-CM

## 2015-07-23 DIAGNOSIS — N186 End stage renal disease: Secondary | ICD-10-CM | POA: Diagnosis present

## 2015-07-23 DIAGNOSIS — D631 Anemia in chronic kidney disease: Secondary | ICD-10-CM | POA: Diagnosis present

## 2015-07-23 DIAGNOSIS — Z8551 Personal history of malignant neoplasm of bladder: Secondary | ICD-10-CM | POA: Diagnosis not present

## 2015-07-23 DIAGNOSIS — E785 Hyperlipidemia, unspecified: Secondary | ICD-10-CM | POA: Diagnosis present

## 2015-07-23 DIAGNOSIS — E8770 Fluid overload, unspecified: Secondary | ICD-10-CM | POA: Diagnosis present

## 2015-07-23 DIAGNOSIS — Z9889 Other specified postprocedural states: Secondary | ICD-10-CM

## 2015-07-23 DIAGNOSIS — I509 Heart failure, unspecified: Secondary | ICD-10-CM | POA: Diagnosis present

## 2015-07-23 DIAGNOSIS — R0603 Acute respiratory distress: Secondary | ICD-10-CM

## 2015-07-23 DIAGNOSIS — J9621 Acute and chronic respiratory failure with hypoxia: Secondary | ICD-10-CM | POA: Diagnosis present

## 2015-07-23 DIAGNOSIS — Z87891 Personal history of nicotine dependence: Secondary | ICD-10-CM

## 2015-07-23 DIAGNOSIS — R0602 Shortness of breath: Secondary | ICD-10-CM | POA: Diagnosis present

## 2015-07-23 LAB — CBC WITH DIFFERENTIAL/PLATELET
BASOS ABS: 0 10*3/uL (ref 0–0.1)
BASOS PCT: 1 %
EOS ABS: 0.5 10*3/uL (ref 0–0.7)
EOS PCT: 7 %
HCT: 26 % — ABNORMAL LOW (ref 40.0–52.0)
Hemoglobin: 8.1 g/dL — ABNORMAL LOW (ref 13.0–18.0)
Lymphocytes Relative: 21 %
Lymphs Abs: 1.4 10*3/uL (ref 1.0–3.6)
MCH: 28.9 pg (ref 26.0–34.0)
MCHC: 31.3 g/dL — ABNORMAL LOW (ref 32.0–36.0)
MCV: 92.1 fL (ref 80.0–100.0)
MONO ABS: 0.7 10*3/uL (ref 0.2–1.0)
Monocytes Relative: 11 %
Neutro Abs: 4.3 10*3/uL (ref 1.4–6.5)
Neutrophils Relative %: 60 %
PLATELETS: 237 10*3/uL (ref 150–440)
RBC: 2.82 MIL/uL — ABNORMAL LOW (ref 4.40–5.90)
RDW: 16.6 % — AB (ref 11.5–14.5)
WBC: 7 10*3/uL (ref 3.8–10.6)

## 2015-07-23 LAB — COMPREHENSIVE METABOLIC PANEL
ALBUMIN: 2.8 g/dL — AB (ref 3.5–5.0)
ALT: 6 U/L — ABNORMAL LOW (ref 17–63)
AST: 18 U/L (ref 15–41)
Alkaline Phosphatase: 87 U/L (ref 38–126)
Anion gap: 12 (ref 5–15)
BUN: 51 mg/dL — AB (ref 6–20)
CHLORIDE: 101 mmol/L (ref 101–111)
CO2: 28 mmol/L (ref 22–32)
Calcium: 8.5 mg/dL — ABNORMAL LOW (ref 8.9–10.3)
Creatinine, Ser: 4.6 mg/dL — ABNORMAL HIGH (ref 0.61–1.24)
GFR calc Af Amer: 13 mL/min — ABNORMAL LOW (ref >60)
GFR, EST NON AFRICAN AMERICAN: 11 mL/min — AB (ref >60)
GLUCOSE: 145 mg/dL — AB (ref 65–99)
POTASSIUM: 4.8 mmol/L (ref 3.5–5.1)
Sodium: 141 mmol/L (ref 135–145)
Total Bilirubin: 0.4 mg/dL (ref 0.3–1.2)
Total Protein: 6.2 g/dL — ABNORMAL LOW (ref 6.5–8.1)

## 2015-07-23 LAB — GLUCOSE, CAPILLARY: Glucose-Capillary: 153 mg/dL — ABNORMAL HIGH (ref 65–99)

## 2015-07-23 LAB — MRSA PCR SCREENING: MRSA by PCR: NEGATIVE

## 2015-07-23 LAB — TROPONIN I: TROPONIN I: 0.03 ng/mL (ref <0.031)

## 2015-07-23 MED ORDER — ATORVASTATIN CALCIUM 20 MG PO TABS
40.0000 mg | ORAL_TABLET | Freq: Every day | ORAL | Status: DC
  Administered 2015-07-23 – 2015-07-24 (×2): 40 mg via ORAL
  Filled 2015-07-23 (×2): qty 2

## 2015-07-23 MED ORDER — FERROUS SULFATE 325 (65 FE) MG PO TABS
325.0000 mg | ORAL_TABLET | Freq: Every day | ORAL | Status: DC
  Administered 2015-07-24: 325 mg via ORAL
  Filled 2015-07-23: qty 1

## 2015-07-23 MED ORDER — AMLODIPINE BESYLATE 5 MG PO TABS
2.5000 mg | ORAL_TABLET | Freq: Every day | ORAL | Status: DC
Start: 2015-07-23 — End: 2015-07-24
  Administered 2015-07-23 – 2015-07-24 (×2): 2.5 mg via ORAL
  Filled 2015-07-23 (×2): qty 1

## 2015-07-23 MED ORDER — NICOTINE 10 MG IN INHA: 1.0000 | RESPIRATORY_TRACT | Status: DC | PRN

## 2015-07-23 MED ORDER — NICOTINE POLACRILEX 2 MG MT GUM
2.0000 mg | CHEWING_GUM | OROMUCOSAL | Status: DC | PRN
  Filled 2015-07-23: qty 1

## 2015-07-23 MED ORDER — AMIODARONE HCL 200 MG PO TABS
200.0000 mg | ORAL_TABLET | Freq: Every day | ORAL | Status: DC
  Administered 2015-07-23 – 2015-07-24 (×2): 200 mg via ORAL
  Filled 2015-07-23 (×2): qty 1

## 2015-07-23 MED ORDER — SEVELAMER CARBONATE 800 MG PO TABS
800.0000 mg | ORAL_TABLET | Freq: Three times a day (TID) | ORAL | Status: DC
  Administered 2015-07-23 – 2015-07-24 (×3): 800 mg via ORAL
  Filled 2015-07-23 (×3): qty 1

## 2015-07-23 MED ORDER — PANTOPRAZOLE SODIUM 40 MG PO TBEC
40.0000 mg | DELAYED_RELEASE_TABLET | Freq: Two times a day (BID) | ORAL | Status: DC
  Administered 2015-07-23 – 2015-07-24 (×3): 40 mg via ORAL
  Filled 2015-07-23 (×3): qty 1

## 2015-07-23 MED ORDER — ENOXAPARIN SODIUM 30 MG/0.3ML ~~LOC~~ SOLN: 30.0000 mg | SUBCUTANEOUS | Status: DC

## 2015-07-23 MED ORDER — BUDESONIDE-FORMOTEROL FUMARATE 160-4.5 MCG/ACT IN AERO
2.0000 | INHALATION_SPRAY | Freq: Two times a day (BID) | RESPIRATORY_TRACT | Status: DC
  Administered 2015-07-24: 2 via RESPIRATORY_TRACT
  Filled 2015-07-23: qty 6

## 2015-07-23 MED ORDER — PREDNISONE 20 MG PO TABS: 60.0000 mg | ORAL_TABLET | Freq: Every day | ORAL | Status: DC

## 2015-07-23 MED ORDER — NEPRO/CARBSTEADY PO LIQD
237.0000 mL | Freq: Two times a day (BID) | ORAL | Status: DC
  Administered 2015-07-24 (×2): 237 mL via ORAL

## 2015-07-23 MED ORDER — POLYETHYLENE GLYCOL 3350 17 G PO PACK
17.0000 g | PACK | Freq: Every day | ORAL | Status: DC
  Administered 2015-07-23: 17 g via ORAL
  Filled 2015-07-23: qty 1

## 2015-07-23 MED ORDER — TIOTROPIUM BROMIDE MONOHYDRATE 18 MCG IN CAPS
18.0000 ug | ORAL_CAPSULE | Freq: Every day | RESPIRATORY_TRACT | Status: DC
  Administered 2015-07-24: 18 ug via RESPIRATORY_TRACT
  Filled 2015-07-23: qty 5

## 2015-07-23 MED ORDER — MAGNESIUM OXIDE 400 MG PO TABS
400.0000 mg | ORAL_TABLET | Freq: Every evening | ORAL | Status: DC
  Administered 2015-07-23: 400 mg via ORAL
  Filled 2015-07-23 (×4): qty 1

## 2015-07-23 MED ORDER — SODIUM CHLORIDE 0.9 % IV SOLN: 100.0000 mL | INTRAVENOUS | Status: DC | PRN

## 2015-07-23 MED ORDER — ALBUTEROL SULFATE (2.5 MG/3ML) 0.083% IN NEBU: 2.5000 mg | INHALATION_SOLUTION | RESPIRATORY_TRACT | Status: DC | PRN

## 2015-07-23 MED ORDER — IPRATROPIUM-ALBUTEROL 0.5-2.5 (3) MG/3ML IN SOLN
3.0000 mL | Freq: Four times a day (QID) | RESPIRATORY_TRACT | Status: DC
  Administered 2015-07-23 – 2015-07-24 (×3): 3 mL via RESPIRATORY_TRACT
  Filled 2015-07-23 (×3): qty 3

## 2015-07-23 NOTE — ED Notes (Signed)
CBG 153 

## 2015-07-23 NOTE — ED Notes (Signed)
Pt's medical POA [name] phone [phone] (cell).

## 2015-07-23 NOTE — ED Notes (Signed)
Pt to ED from home c/o respiratory distress. Pt is dialysis pt, missed appointment yesterday. Per EMS, pt was unresponsive upon ambulance arrival, diaphoretic, pt placed on CPAP, duoneb treatment x1. Upon arrival to hospital pt is alert, answering questions appropriately.

## 2015-07-23 NOTE — Progress Notes (Signed)
Patient transported to ICU room 7 on V60 Bipap from ED.  Patient tolerated transport well, remains on Bipap 12/5 30% with O2 sat 100% and heart rate 75.  Will continue to monitor.

## 2015-07-23 NOTE — H&P (Signed)
[facility] NAME: [name]    MR#:  [id number]  DATE OF BIRTH:  Aug 20, 1941  DATE OF ADMISSION:  07/23/2015  PRIMARY CARE PHYSICIAN: Dion Body, MD   REQUESTING/REFERRING PHYSICIAN:Dr. Archie Balboa  CHIEF COMPLAINT:   Chief Complaint  Patient presents with  . Respiratory Distress    HISTORY OF PRESENT ILLNESS: [name]  is a 74 y.o. male with a known history of end-stage renal disease on hemodialysis, atrial fibrillation, CHF, COPD on home oxygen at nighttime, hyperlipidemia, mini stroke, bladder cancer has hemodialysis days Tuesday Thursday and Saturday. Yesterday he was ready to go for dialysis but his transporter came up very late and so the dialysis center called they don't have any open spots for him at that time and he did not record any dialysis. Today morning he woke up and he was feeling very short of breath so he called ambulance and as per the EMS he was hypoxic in the level of 70s and he was very lethargic with that they gave her some breathing treatment and started on BiPAP in the emergency room and he is completely alert and oriented with good oxygen saturations. He was noted to have pulmonary edema on chest x-ray and so called in for admission.   PAST MEDICAL HISTORY:   Past Medical History  Diagnosis Date  . PAF (paroxysmal atrial fibrillation) ([place])     a. new onset 12/2014 in the setting of UTI, sepsis, hypotension, and anemia; b. not on long term anticoagulation given anemia; c. family aware of stroke risk, they are ok with this; d. on amiodarone   . Chronic combined systolic and diastolic CHF (congestive heart failure) ([place])     a. echo 12/2014: EF 25-30%, anterior wall wall motion abnormalities; b. planned ischemic evaluation once patient is stable medically; c. echo 01/2015: EF 45-50%, no RWMA, mild AT, LA mildly dilated, mod pericardial effusion along LV free wall, no evidence of hemodynamic  compromise  . ESRD on hemodialysis ([place])     a. Tuesday, Thursday, and Saturdays  . Anemia     a. baseline hgb ~ 8  . History of small bowel obstruction     a. 01/2015  . History of septic shock     a. 01/2015  . GERD (gastroesophageal reflux disease)   . Allergic rhinitis   . COPD (chronic obstructive pulmonary disease) ([place])   . Hypercholesteremia   . Cognitive communication deficit   . Magnesium deficiency   . Dementia   . 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 Incontinence   . Hydronephrosis   . Overactive bladder   . Detrusor sphincter dyssynergia   . Mini stroke ([place])   . Bladder cancer ([place])   . Prostate cancer ([place])   . Renal insufficiency   . Hypertension     PAST SURGICAL HISTORY:  Past Surgical History  Procedure Laterality Date  . Cystoscopy w/ ureteral stent placement    . Transurethral resection of bladder tumor with gyrus (turbt-gyrus)    . Ureteral stent placement    . Prostate surgery      Removal  . Av fistula placement      left arm    SOCIAL HISTORY:  Social History  Substance Use Topics  . Smoking status: Former Smoker -- 0.25 packs/day for 73 years    Quit date: 05/13/2015  . Smokeless tobacco: Never Used  . Alcohol Use: No    FAMILY HISTORY:  Family History  Problem Relation Age of Onset  . Stroke Mother     DRUG ALLERGIES:  Allergies  Allergen Reactions  . Penicillins Hives, Itching and Swelling    REVIEW OF SYSTEMS:   CONSTITUTIONAL: No fever, fatigue or weakness.  EYES: No blurred or double vision.  EARS, NOSE, AND THROAT: No tinnitus or ear pain.  RESPIRATORY: No cough,  positive for shortness of breath, no  wheezing or hemoptysis.  CARDIOVASCULAR: No chest pain, orthopnea, edema.  GASTROINTESTINAL: No nausea, vomiting, diarrhea or abdominal pain.  GENITOURINARY: No dysuria, hematuria.  ENDOCRINE: No polyuria, nocturia,  HEMATOLOGY: No anemia, easy bruising or bleeding SKIN: No rash or lesion. MUSCULOSKELETAL: No joint pain or  arthritis.   NEUROLOGIC: No tingling, numbness, weakness.  PSYCHIATRY: No anxiety or depression.   MEDICATIONS AT HOME:  Prior to Admission medications   Medication Sig Start Date End Date Taking? Authorizing Provider  albuterol (PROAIR HFA) 108 (90 BASE) MCG/ACT inhaler Inhale into the lungs. 06/23/15   Historical Provider, MD  albuterol (PROVENTIL HFA;VENTOLIN HFA) 108 (90 BASE) MCG/ACT inhaler Inhale 2 puffs into the lungs every 4 (four) hours as needed for wheezing or shortness of breath.    Historical Provider, MD  albuterol (PROVENTIL) (2.5 MG/3ML) 0.083% nebulizer solution Take 3 mLs (2.5 mg total) by nebulization every 4 (four) hours as needed for wheezing or shortness of breath. Patient not taking: Reported on 06/23/2015 06/10/15   Fritzi Mandes, MD  amiodarone (PACERONE) 200 MG tablet Take 1 tablet (200 mg total) by mouth daily. 02/27/15   Wellington Hampshire, MD  amLODipine (NORVASC) 2.5 MG tablet Take 2.5 mg by mouth daily.    Historical Provider, MD  atorvastatin (LIPITOR) 40 MG tablet Take 1 tablet (40 mg total) by mouth daily. 02/03/15   Fritzi Mandes, MD  budesonide-formoterol (SYMBICORT) 160-4.5 MCG/ACT inhaler Inhale 2 puffs into the lungs 2 (two) times daily.    Historical Provider, MD  chlorpheniramine-HYDROcodone (TUSSIONEX) 10-8 MG/5ML SUER Take 5 mLs by mouth every 12 (twelve) hours. 05/08/15   Theodoro Grist, MD  ferrous sulfate 325 (65 FE) MG tablet Take 1 tablet (325 mg total) by mouth daily with breakfast. 03/27/15   Hillary Bow, MD  guaiFENesin (MUCINEX) 600 MG 12 hr tablet Take 1 tablet (600 mg total) by mouth 2 (two) times daily. 05/08/15   Theodoro Grist, MD  ipratropium-albuterol (DUONEB) 0.5-2.5 (3) MG/3ML SOLN Take 3 mLs by nebulization every 4 (four) hours as needed. 06/10/15   Fritzi Mandes, MD  levofloxacin (LEVAQUIN) 500 MG tablet Take 1 tablet by mouth every other day. 05/30/15   Historical Provider, MD  magnesium oxide (MAG-OX) 400 MG tablet Take 400 mg by mouth every evening.      Historical Provider, MD  nicotine (NICOTROL) 10 MG inhaler Inhale 1 cartridge (1 continuous puffing total) into the lungs as needed for smoking cessation. 05/08/15   Theodoro Grist, MD  Nutritional Supplements (FEEDING SUPPLEMENT, NEPRO CARB STEADY,) LIQD Take 237 mLs by mouth 2 (two) times daily between meals. 02/03/15   Fritzi Mandes, MD  pantoprazole (PROTONIX) 40 MG tablet Take 1 tablet (40 mg total) by mouth 2 (two) times daily. 05/08/15   Theodoro Grist, MD  polyethylene glycol (MIRALAX / GLYCOLAX) packet Take 17 g by mouth daily. 03/27/15   Hillary Bow, MD  predniSONE (DELTASONE) 20 MG tablet Take 3 tablets (60 mg total) by mouth daily with breakfast. Taper by 20 mg daily then stop 06/10/15   Fritzi Mandes, MD  sevelamer carbonate (RENVELA) 800 MG tablet  Take 800 mg by mouth 3 (three) times daily before meals.     Historical Provider, MD  tiotropium (SPIRIVA) 18 MCG inhalation capsule Place 1 capsule (18 mcg total) into inhaler and inhale daily. 02/17/15   Dustin Flock, MD      PHYSICAL EXAMINATION:   VITAL SIGNS: Blood pressure 131/79, pulse 75, temperature 98.2 F (36.8 C), temperature source Rectal, resp. rate 22, height 6\' 2"  (1.88 m), weight 116.438 kg (256 lb 11.2 oz), SpO2 100 %.  GENERAL:  73 y.o.-year-old patient lying in the bed with no acute distress.  EYES: Pupils equal, round, reactive to light and accommodation. No scleral icterus. Extraocular muscles intact.  HEENT: Head atraumatic, normocephalic. Oropharynx and nasopharynx clear.  NECK:  Supple, no jugular venous distention. No thyroid enlargement, no tenderness.  LUNGS: Normal breath sounds bilaterally, no wheezing, some crepitation. No use of accessory muscles of respiration.  using BiPAP . CARDIOVASCULAR: S1, S2 normal. No murmurs, rubs, or gallops.  ABDOMEN: Soft, nontender, nondistended. Bowel sounds present. No organomegaly or mass.  EXTREMITIES: No pedal edema, cyanosis, or clubbing.  NEUROLOGIC: Cranial nerves II through  XII are intact. Muscle strength 5/5 in upper extremities, and 2-3 out of 5 in lower extremities. Sensation intact. Gait not checked.  PSYCHIATRIC: The patient is alert and oriented x 3.  SKIN: No obvious rash, lesion, or ulcer.   LABORATORY PANEL:   CBC  Recent Labs Lab 07/18/15 0655 07/23/15 0857  WBC 9.5 7.0  HGB 8.0* 8.1*  HCT 25.1* 26.0*  PLT 205 237  MCV 92.2 92.1  MCH 29.4 28.9  MCHC 31.9* 31.3*  RDW 16.8* 16.6*  LYMPHSABS 0.9* 1.4  MONOABS 0.7 0.7  EOSABS 0.4 0.5  BASOSABS 0.0 0.0   ------------------------------------------------------------------------------------------------------------------  Chemistries   Recent Labs Lab 07/18/15 0655 07/23/15 0857  NA 143 141  K 4.9 4.8  CL 102 101  CO2 29 28  GLUCOSE 118* 145*  BUN 64* 51*  CREATININE 4.58* 4.60*  CALCIUM 8.7* 8.5*  AST  --  18  ALT  --  6*  ALKPHOS  --  87  BILITOT  --  0.4   ------------------------------------------------------------------------------------------------------------------ estimated creatinine clearance is 19.4 mL/min (by C-G formula based on Cr of 4.6). ------------------------------------------------------------------------------------------------------------------ No results for input(s): TSH, T4TOTAL, T3FREE, THYROIDAB in the last 72 hours.  Invalid input(s): FREET3   Coagulation profile No results for input(s): INR, PROTIME in the last 168 hours. ------------------------------------------------------------------------------------------------------------------- No results for input(s): DDIMER in the last 72 hours. -------------------------------------------------------------------------------------------------------------------  Cardiac Enzymes  Recent Labs Lab 07/18/15 0655 07/23/15 0857  TROPONINI 0.04* 0.03   ------------------------------------------------------------------------------------------------------------------ Invalid input(s):  POCBNP  ---------------------------------------------------------------------------------------------------------------  Urinalysis    Component Value Date/Time   COLORURINE YELLOW* 03/21/2015 1043   COLORURINE Yellow 01/20/2015 1609   APPEARANCEUR TURBID* 03/21/2015 1043   APPEARANCEUR Turbid 01/20/2015 1609   LABSPEC 1.012 03/21/2015 1043   LABSPEC 1.011 01/20/2015 1609   PHURINE 7.0 03/21/2015 1043   PHURINE 7.0 01/20/2015 1609   GLUCOSEU Trace* 04/10/2015 1206   GLUCOSEU Negative 01/20/2015 1609   HGBUR 2+* 03/21/2015 1043   HGBUR 2+ 01/20/2015 1609   BILIRUBINUR Negative 04/10/2015 1206   BILIRUBINUR NEGATIVE 03/21/2015 1043   BILIRUBINUR Negative 01/20/2015 1609   KETONESUR NEGATIVE 03/21/2015 1043   KETONESUR Negative 01/20/2015 1609   PROTEINUR >500* 03/21/2015 1043   PROTEINUR 100 mg/dL 01/20/2015 1609   NITRITE Negative 04/10/2015 1206   NITRITE NEGATIVE 03/21/2015 1043   NITRITE Negative 01/20/2015 1609   LEUKOCYTESUR 3+* 04/10/2015 1206  LEUKOCYTESUR 2+* 03/21/2015 1043   LEUKOCYTESUR 2+ 01/20/2015 1609     RADIOLOGY: Dg Chest Port 1 View  07/23/2015  CLINICAL DATA:  Respiratory distress EXAM: PORTABLE CHEST 1 VIEW COMPARISON:  07/18/2015 FINDINGS: Normal heart size. Mild diffuse edema identified. Right pleural effusion unchanged. No airspace consolidation noted. IMPRESSION: 1. Mild CHF. Electronically Signed   By: Kerby Moors M.D.   On: 07/23/2015 09:43    IMPRESSION AND PLAN:  * Fluid overload due to missed hemodialysis yesterday  Causing acute on chronic respiratory failure with hypoxia- requiring BiPAP currently.    Admit to stepdown unit at this time, I spoke to nephrologist to get him dialysis today.  If after dialysis he is able to come off the BiPAP he can be safely sent to the regular floor.  * End-stage renal disease on hemodialysis  As mentioned above get dialysis here.  * COPD with chronic respiratory failure requiring home oxygen at  nighttime  Currently no active wheezing, continue his home inhalers and nebulizers.  * Hypertension  Blood pressure is stable continue home medications.  * Paroxysmal atrial fibrillation  Heart rate is under control continue amiodarone, his not on anticoagulation.   All the records are reviewed and case discussed with ED provider. Management plans discussed with the patient, family and they are in agreement.  CODE STATUS: Full  Condition is critical because of hypoxia and requiring BiPAP for respiratory failure , his healthcare power of attorney is  [name], is present in the room. I explained to her about the plan and she agrees with the plan also.   TOTAL TIME TAKING CARE OF THIS PATIENT:50 critical care minutes.    [name] M.D on 07/23/2015   Between 7am to 6pm - Pager - [phone]  After 6pm go to [email or web address] - password [email or web address] [facility]  Office  [phone]  CC: Primary care physician; Dion Body, MD   Note: This dictation was prepared with Dragon dictation along with smaller phrase technology. Any transcriptional errors that result from this process are unintentional.

## 2015-07-23 NOTE — Progress Notes (Signed)
[facility]  ROUNDING NOTE   Subjective:   Patient missed dialysis yesterday. Presents with shortness of breath. Patient states he has been getting shortness of breath for last week. He came to the ED on 10/25 and he was sent to outpatient hemodialysis. He did feel better after treatment however not getting to estimated dry weight. 90.5kg.   Objective:  Vital signs in last 24 hours:  Temp:  [97.6 F (36.4 C)-98.2 F (36.8 C)] 97.6 F (36.4 C) (10/30 1505) Pulse Rate:  [66-91] 76 (10/30 1530) Resp:  [12-33] 16 (10/30 1545) BP: (109-142)/(66-94) 142/83 mmHg (10/30 1545) SpO2:  [97 %-100 %] 100 % (10/30 1505) FiO2 (%):  [30 %-40 %] 30 % (10/30 1337) Weight:  [91.7 kg (202 lb 2.6 oz)-116.438 kg (256 lb 11.2 oz)] 91.7 kg (202 lb 2.6 oz) (10/30 1505)  Weight change:  Filed Weights   07/23/15 0852 07/23/15 1337 07/23/15 1505  Weight: 116.438 kg (256 lb 11.2 oz) 92.8 kg (204 lb 9.4 oz) 91.7 kg (202 lb 2.6 oz)    Intake/Output:     Intake/Output this shift:     Physical Exam: General: NAD,   Head: Normocephalic, atraumatic. Moist oral mucosal membranes  Eyes: Anicteric, PERRL  Neck: Supple, trachea midline  Lungs:  Clear to auscultation  Heart: Regular rate and rhythm  Abdomen:  Soft, nontender,   Extremities:  no peripheral edema.  Neurologic: Nonfocal, moving all four extremities  Skin: No lesions  Access: Left AVF     Basic Metabolic Panel:  Recent Labs Lab 07/18/15 0655 07/23/15 0857  NA 143 141  K 4.9 4.8  CL 102 101  CO2 29 28  GLUCOSE 118* 145*  BUN 64* 51*  CREATININE 4.58* 4.60*  CALCIUM 8.7* 8.5*    Liver Function Tests:  Recent Labs Lab 07/23/15 0857  AST 18  ALT 6*  ALKPHOS 87  BILITOT 0.4  PROT 6.2*  ALBUMIN 2.8*   No results for input(s): LIPASE, AMYLASE in the last 168 hours. No results for input(s): AMMONIA in the last 168 hours.  CBC:  Recent Labs Lab 07/18/15 0655 07/23/15 0857  WBC 9.5 7.0  NEUTROABS 7.5* 4.3   HGB 8.0* 8.1*  HCT 25.1* 26.0*  MCV 92.2 92.1  PLT 205 237    Cardiac Enzymes:  Recent Labs Lab 07/18/15 0655 07/23/15 0857  TROPONINI 0.04* 0.03    BNP: Invalid input(s): POCBNP  CBG:  Recent Labs Lab 07/23/15 0854  GLUCAP 153*    Microbiology: Results for orders placed or performed during the hospital encounter of 07/23/15  MRSA PCR Screening     Status: None   Collection Time: 07/23/15  1:45 PM  Result Value Ref Range Status   MRSA by PCR NEGATIVE NEGATIVE Final    Comment:        The GeneXpert MRSA Assay (FDA approved for NASAL specimens only), is one component of a comprehensive MRSA colonization surveillance program. It is not intended to diagnose MRSA infection nor to guide or monitor treatment for MRSA infections.     Coagulation Studies: No results for input(s): LABPROT, INR in the last 72 hours.  Urinalysis: No results for input(s): COLORURINE, LABSPEC, PHURINE, GLUCOSEU, HGBUR, BILIRUBINUR, KETONESUR, PROTEINUR, UROBILINOGEN, NITRITE, LEUKOCYTESUR in the last 72 hours.  Invalid input(s): APPERANCEUR    Imaging: Dg Chest Port 1 View  07/23/2015  CLINICAL DATA:  Respiratory distress EXAM: PORTABLE CHEST 1 VIEW COMPARISON:  07/18/2015 FINDINGS: Normal heart size. Mild diffuse edema identified. Right pleural effusion  unchanged. No airspace consolidation noted. IMPRESSION: 1. Mild CHF. Electronically Signed   By: Kerby Moors M.D.   On: 07/23/2015 09:43     Medications:     . amiodarone  200 mg Oral Daily  . amLODipine  2.5 mg Oral Daily  . atorvastatin  40 mg Oral Daily  . budesonide-formoterol  2 puff Inhalation BID  . enoxaparin (LOVENOX) injection  30 mg Subcutaneous Q24H  . feeding supplement (NEPRO CARB STEADY)  237 mL Oral BID BM  . [START ON 07/24/2015] ferrous sulfate  325 mg Oral Q breakfast  . ipratropium-albuterol  3 mL Nebulization Q6H  . magnesium oxide  400 mg Oral QPM  . pantoprazole  40 mg Oral BID  . polyethylene  glycol  17 g Oral Daily  . sevelamer carbonate  800 mg Oral TID AC  . tiotropium  18 mcg Inhalation Daily   albuterol, nicotine polacrilex  Assessment/ Plan:  [name] is a 74 y.o. black male with end-stage ren history of small bowel obstruction in May 2016, GERD, COPD,bladder cancer, history of TURBT  [facility] TTS [place].   1. ESRD: emergent hemodialysis for acute respiratory failure requiring BiPap.  - Ultrafiltration of 3-4 litres - Monitor daily for dialysis  - renally dose all medications: discontinue lovenox  2. Anemia of chronic kidney disease: hemoglobin 8.1 - epo with treatment.  - iron supplements.   3. Secondary hyperparathyroidism: outpatient labs: 299, ca 8.7, phos 4.3 - sevelamer with meals.   4. Hypertension:  - amlodipine low dose.     LOS: 0 Gwenlyn Hottinger 10/30/20164:00 PM

## 2015-07-23 NOTE — ED Provider Notes (Signed)
[facility] Emergency Department Provider Note    ____________________________________________  Time seen: On EMS arrival  I have reviewed the triage vital signs and the nursing notes.   HISTORY  Chief Complaint Respiratory Distress   History limited by: respiratory distress. Most history obtained from EMS   HPI [name] is a 74 y.o. male with history of end-stage renal disease on dialysis who presents to the emergency department today via EMS because of respiratory distress. The patient states that he missed his scheduled dialysis yesterday. He then developed shortness of breath today. EMS states when they got there patient was in severe respiratory distress with diaphoresis. The patient had initial O2 sats of the low 80s to high 70s. He was minimally responsive initially for EMS. They did place him on CPAP and started one DuoNeb treatment. They stated that he started to become more responsive and O2 sats improved on CPAP. Patient denies any chest pain. Denies any fevers.     Past Medical History  Diagnosis Date  . PAF (paroxysmal atrial fibrillation) ([place])     a. new onset 12/2014 in the setting of UTI, sepsis, hypotension, and anemia; b. not on long term anticoagulation given anemia; c. family aware of stroke risk, they are ok with this; d. on amiodarone   . Chronic combined systolic and diastolic CHF (congestive heart failure) ([place])     a. echo 12/2014: EF 25-30%, anterior wall wall motion abnormalities; b. planned ischemic evaluation once patient is stable medically; c. echo 01/2015: EF 45-50%, no RWMA, mild AT, LA mildly dilated, mod pericardial effusion along LV free wall, no evidence of hemodynamic compromise  . ESRD on hemodialysis ([place])     a. Tuesday, Thursday, and Saturdays  . Anemia     a. baseline hgb ~ 8  . History of small bowel obstruction     a. 01/2015  . History of septic shock     a. 01/2015  . GERD (gastroesophageal reflux  disease)   . Allergic rhinitis   . COPD (chronic obstructive pulmonary disease) ([place])   . Hypercholesteremia   . Cognitive communication deficit   . Magnesium deficiency   . Dementia   . 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 Incontinence   . Hydronephrosis   . Overactive bladder   . Detrusor sphincter dyssynergia   . Mini stroke ([place])   . Bladder cancer ([place])   . Prostate cancer ([place])   . Renal insufficiency   . Hypertension     Patient Active Problem List   Diagnosis Date Noted  . Generalized weakness 05/08/2015  . ESRD on dialysis ([place]) 05/08/2015  . Other emphysema ([place]) 05/08/2015  . Tobacco use disorder 05/08/2015  . Hypokalemia 05/08/2015  . Pressure ulcer 05/05/2015  . Symptomatic anemia 05/04/2015  . CAFL (chronic airflow limitation) ([place]) 04/10/2015  . Malnutrition of moderate degree ([place]) 03/22/2015  . Abdominal pain   . Pneumonia 03/20/2015  . History of sepsis 02/27/2015  . Incontinence 02/27/2015  . Persistent atrial fibrillation ([place]) 02/27/2015  . Preoperative cardiovascular examination 02/27/2015  . GI bleed 02/13/2015  . Ileus ([place])   . Chronic combined systolic and diastolic CHF (congestive heart failure) ([place])   . ESRD on hemodialysis ([place])   . Anemia   . History of small bowel obstruction   . Atrial fibrillation with RVR ([place]) 01/21/2015  . Chronic kidney disease (CKD), stage IV (severe) ([place]) 03/01/2014  . Essential (primary) hypertension 03/01/2014  . BP (high blood pressure) 03/01/2014  .  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 Absence of bladder continence 03/01/2014  . Chronic kidney disease requiring chronic dialysis ([place]) 03/01/2014  . Bladder compliance low 11/11/2012  . Cystitis 11/11/2012  . Acute cystitis 10/15/2012  . AAA (abdominal aortic aneurysm) ([place]) 08/27/2012  . Aneurysm artery, iliac ([place]) 08/27/2012  . Chronic kidney disease (CKD), stage V ([place]) 08/27/2012  . Hydronephrosis 08/27/2012  . Incomplete bladder emptying 08/27/2012  . Bladder  cancer ([place]) 08/27/2012  . CA of prostate ([place]) 08/27/2012  . Bone metastases ([place]) 08/27/2012  . Infection of urinary tract 08/27/2012    Past Surgical History  Procedure Laterality Date  . Cystoscopy w/ ureteral stent placement    . Transurethral resection of bladder tumor with gyrus (turbt-gyrus)    . Ureteral stent placement    . Prostate surgery      Removal  . Av fistula placement      left arm    Current Outpatient Rx  Name  Route  Sig  Dispense  Refill  . albuterol (PROAIR HFA) 108 (90 BASE) MCG/ACT inhaler   Inhalation   Inhale into the lungs.         Marland Kitchen albuterol (PROVENTIL HFA;VENTOLIN HFA) 108 (90 BASE) MCG/ACT inhaler   Inhalation   Inhale 2 puffs into the lungs every 4 (four) hours as needed for wheezing or shortness of breath.         Marland Kitchen albuterol (PROVENTIL) (2.5 MG/3ML) 0.083% nebulizer solution   Nebulization   Take 3 mLs (2.5 mg total) by nebulization every 4 (four) hours as needed for wheezing or shortness of breath. Patient not taking: Reported on 06/23/2015   75 mL   12   . amiodarone (PACERONE) 200 MG tablet   Oral   Take 1 tablet (200 mg total) by mouth daily.   30 tablet   5   . amLODipine (NORVASC) 2.5 MG tablet   Oral   Take 2.5 mg by mouth daily.         Marland Kitchen atorvastatin (LIPITOR) 40 MG tablet   Oral   Take 1 tablet (40 mg total) by mouth daily.   30 tablet   1   . budesonide-formoterol (SYMBICORT) 160-4.5 MCG/ACT inhaler   Inhalation   Inhale 2 puffs into the lungs 2 (two) times daily.         . chlorpheniramine-HYDROcodone (TUSSIONEX) 10-8 MG/5ML SUER   Oral   Take 5 mLs by mouth every 12 (twelve) hours.   140 mL   0   . ferrous sulfate 325 (65 FE) MG tablet   Oral   Take 1 tablet (325 mg total) by mouth daily with breakfast.   90 tablet   0   . guaiFENesin (MUCINEX) 600 MG 12 hr tablet   Oral   Take 1 tablet (600 mg total) by mouth 2 (two) times daily.   20 tablet   2   . ipratropium-albuterol (DUONEB) 0.5-2.5  (3) MG/3ML SOLN   Nebulization   Take 3 mLs by nebulization every 4 (four) hours as needed.   360 mL   1   . levofloxacin (LEVAQUIN) 500 MG tablet   Oral   Take 1 tablet by mouth every other day.         . magnesium oxide (MAG-OX) 400 MG tablet   Oral   Take 400 mg by mouth every evening.          . nicotine (NICOTROL) 10 MG inhaler   Inhalation   Inhale 1 cartridge (1  continuous puffing total) into the lungs as needed for smoking cessation.   42 each   0   . Nutritional Supplements (FEEDING SUPPLEMENT, NEPRO CARB STEADY,) LIQD   Oral   Take 237 mLs by mouth 2 (two) times daily between meals.   30 Can   0   . pantoprazole (PROTONIX) 40 MG tablet   Oral   Take 1 tablet (40 mg total) by mouth 2 (two) times daily.   30 tablet   0   . polyethylene glycol (MIRALAX / GLYCOLAX) packet   Oral   Take 17 g by mouth daily.   14 each   0   . predniSONE (DELTASONE) 20 MG tablet   Oral   Take 3 tablets (60 mg total) by mouth daily with breakfast. Taper by 20 mg daily then stop   4 tablet   0   . sevelamer carbonate (RENVELA) 800 MG tablet   Oral   Take 800 mg by mouth 3 (three) times daily before meals.          . tiotropium (SPIRIVA) 18 MCG inhalation capsule   Inhalation   Place 1 capsule (18 mcg total) into inhaler and inhale daily.   30 capsule   12     Allergies Penicillins  Family History  Problem Relation Age of Onset  . Stroke Mother     Social History Social History  Substance Use Topics  . Smoking status: Former Smoker -- 0.25 packs/day for 94 years    Quit date: 05/13/2015  . Smokeless tobacco: Never Used  . Alcohol Use: No    Review of Systems  Constitutional: Negative for fever. Cardiovascular: Negative for chest pain. Respiratory: Positive for shortness of breath. Gastrointestinal: Negative for abdominal pain, vomiting and diarrhea. Genitourinary: Negative for dysuria. Musculoskeletal: Negative for back pain. Skin: Negative for  rash. Neurological: Negative for headaches, focal weakness or numbness.  10-point ROS otherwise negative.  ____________________________________________   PHYSICAL EXAM:  VITAL SIGNS: ED Triage Vitals  Enc Vitals Group     BP 07/23/15 0852 141/92 mmHg     Pulse Rate 07/23/15 0850 87     Resp 07/23/15 0850 33     Temp --      Temp src --      SpO2 07/23/15 0850 100 %     Weight 07/23/15 0852 256 lb 11.2 oz (116.438 kg)   Constitutional: On CPAP. Awake and alert. Able to answer in short sentences. Eyes: Conjunctivae are normal. PERRL. Normal extraocular movements. ENT   Head: Normocephalic and atraumatic.   Nose: No congestion/rhinnorhea.   Mouth/Throat: Mucous membranes are moist.   Neck: No stridor. Hematological/Lymphatic/Immunilogical: No cervical lymphadenopathy. Cardiovascular: Normal rate, regular rhythm.  No murmurs, rubs, or gallops. Respiratory: On CPAP, increased respiratory effort. Crackles and rhonchi to right base. Diminished breath sounds diffusely.  Gastrointestinal: Soft and nontender. No distention.  Genitourinary: Deferred Musculoskeletal: Normal range of motion in all extremities. No joint effusions.  No lower extremity tenderness nor edema. Fistula in left upper arm.  Neurologic:  Normal speech and language. No gross focal neurologic deficits are appreciated. Speech is normal.  Skin:  Skin is warm, dry and intact. No rash noted. Psychiatric: Mood and affect are normal. Speech and behavior are normal. Patient exhibits appropriate insight and judgment.  ____________________________________________    LABS (pertinent positives/negatives)  Labs Reviewed  CBC WITH DIFFERENTIAL/PLATELET - Abnormal; Notable for the following:    RBC 2.82 (*)    Hemoglobin 8.1 (*)  HCT 26.0 (*)    MCHC 31.3 (*)    RDW 16.6 (*)    All other components within normal limits  COMPREHENSIVE METABOLIC PANEL - Abnormal; Notable for the following:    Glucose, Bld  145 (*)    BUN 51 (*)    Creatinine, Ser 4.60 (*)    Calcium 8.5 (*)    Total Protein 6.2 (*)    Albumin 2.8 (*)    ALT 6 (*)    GFR calc non Af Amer 11 (*)    GFR calc Af Amer 13 (*)    All other components within normal limits  GLUCOSE, CAPILLARY - Abnormal; Notable for the following:    Glucose-Capillary 153 (*)    All other components within normal limits  CULTURE, BLOOD (ROUTINE X 2)  CULTURE, BLOOD (ROUTINE X 2)  TROPONIN I    ____________________________________________   EKG  I, Nance Pear, attending physician, personally viewed and interpreted this EKG  EKG Time: 1119 Rate: 74 Rhythm: NSR Axis: normal Intervals: qtc normal QRS: narrow, q waves V1 ST changes: no st elevation  ____________________________________________    RADIOLOGY  CXR IMPRESSION: 1. Mild CHF.  I, Rakhi Romagnoli, personally viewed and evaluated these images (plain radiographs) as part of my medical decision making. ____________________________________________   PROCEDURES  Procedure(s) performed: None  Critical Care performed: Yes, see critical care note(s)  CRITICAL CARE Performed by: Nance Pear   Total critical care time: 35 minutes  Critical care time was exclusive of separately billable procedures and treating other patients.  Critical care was necessary to treat or prevent imminent or life-threatening deterioration.  Critical care was time spent personally by me on the following activities: development of treatment plan with patient and/or surrogate as well as nursing, discussions with consultants, evaluation of patient's response to treatment, examination of patient, obtaining history from patient or surrogate, ordering and performing treatments and interventions, ordering and review of laboratory studies, ordering and review of radiographic studies, pulse oximetry and re-evaluation of patient's  condition.  ____________________________________________   INITIAL IMPRESSION / ASSESSMENT AND PLAN / ED COURSE  Pertinent labs & imaging results that were available during my care of the patient were reviewed by me and considered in my medical decision making (see chart for details).  Patient presented to the emergency department today with respiratory distress and altered mental status. He did arrive on CPAP applied by EMS. Upon arrival patient certainly was in respiratory distress. He was switched over to a BiPAP machine. He was awake and alert and able to speak in short sentences initially. After some time on the BiPAP he looked much improved. He is able to speak in full sentences. The chest x-ray does show some mild CHF. I think this is likely secondary to him missing dialysis yesterday. Will admit the patient to the hospitalist service for further management and workup.  ____________________________________________   FINAL CLINICAL IMPRESSION(S) / ED DIAGNOSES  Final diagnoses:  Respiratory distress     Nance Pear, MD 07/23/15 1349

## 2015-07-23 NOTE — Progress Notes (Signed)
Bipap on standby at bedside. Pt on 2L [place]. Tolerating well.

## 2015-07-24 ENCOUNTER — Ambulatory Visit: Payer: Medicare Other | Admitting: Physical Therapy

## 2015-07-24 LAB — HEPATITIS B SURFACE ANTIGEN: HEP B S AG: NEGATIVE

## 2015-07-24 LAB — BASIC METABOLIC PANEL
Anion gap: 8 (ref 5–15)
BUN: 29 mg/dL — ABNORMAL HIGH (ref 6–20)
CALCIUM: 8.5 mg/dL — AB (ref 8.9–10.3)
CO2: 33 mmol/L — AB (ref 22–32)
CREATININE: 3.16 mg/dL — AB (ref 0.61–1.24)
Chloride: 101 mmol/L (ref 101–111)
GFR, EST AFRICAN AMERICAN: 21 mL/min — AB (ref >60)
GFR, EST NON AFRICAN AMERICAN: 18 mL/min — AB (ref >60)
Glucose, Bld: 107 mg/dL — ABNORMAL HIGH (ref 65–99)
Potassium: 4.6 mmol/L (ref 3.5–5.1)
SODIUM: 142 mmol/L (ref 135–145)

## 2015-07-24 LAB — CBC
HCT: 24 % — ABNORMAL LOW (ref 40.0–52.0)
HEMOGLOBIN: 7.8 g/dL — AB (ref 13.0–18.0)
MCH: 29.6 pg (ref 26.0–34.0)
MCHC: 32.3 g/dL (ref 32.0–36.0)
MCV: 91.4 fL (ref 80.0–100.0)
PLATELETS: 204 10*3/uL (ref 150–440)
RBC: 2.62 MIL/uL — AB (ref 4.40–5.90)
RDW: 16.4 % — ABNORMAL HIGH (ref 11.5–14.5)
WBC: 6.6 10*3/uL (ref 3.8–10.6)

## 2015-07-24 LAB — HEPATITIS B CORE ANTIBODY, TOTAL: HEP B C TOTAL AB: NEGATIVE

## 2015-07-24 LAB — HEPATITIS B SURFACE ANTIBODY, QUANTITATIVE

## 2015-07-24 MED ORDER — HEPARIN SODIUM (PORCINE) 5000 UNIT/ML IJ SOLN: 5000.0000 [IU] | Freq: Three times a day (TID) | INTRAMUSCULAR | Status: DC

## 2015-07-24 MED ORDER — ALBUTEROL SULFATE (2.5 MG/3ML) 0.083% IN NEBU: 2.5000 mg | INHALATION_SOLUTION | RESPIRATORY_TRACT | Status: DC

## 2015-07-24 MED ORDER — ALBUTEROL SULFATE HFA 108 (90 BASE) MCG/ACT IN AERS: 2.0000 | INHALATION_SPRAY | RESPIRATORY_TRACT | Status: DC | PRN

## 2015-07-24 NOTE — Progress Notes (Signed)
Pt verbalized does not take all medications listed on Home Med Rec, MD/Patel notified, pt educated by this Rn per MD/Patel to f/u with primary provider and update on medications not taken

## 2015-07-24 NOTE — Progress Notes (Signed)
[facility]  ROUNDING NOTE   Subjective:   Patient missed dialysis on Saturday due to transportation times mixup. Underwent emergent HD yesterday Feels back to hi usual state of health today No acute c/o  Objective:  Vital signs in last 24 hours:  Temp:  [97.6 F (36.4 C)-98.2 F (36.8 C)] 98 F (36.7 C) (10/30 1938) Pulse Rate:  [66-91] 71 (10/31 0400) Resp:  [12-33] 22 (10/31 0400) BP: (99-151)/(56-97) 109/66 mmHg (10/31 0400) SpO2:  [97 %-100 %] 100 % (10/31 0756) FiO2 (%):  [30 %-40 %] 30 % (10/30 1337) Weight:  [87.8 kg (193 lb 9 oz)-116.438 kg (256 lb 11.2 oz)] 87.8 kg (193 lb 9 oz) (10/30 1805)  Weight change:  Filed Weights   07/23/15 1337 07/23/15 1505 07/23/15 1805  Weight: 92.8 kg (204 lb 9.4 oz) 91.7 kg (202 lb 2.6 oz) 87.8 kg (193 lb 9 oz)    Intake/Output: I/O last 3 completed shifts: In: 68 [P.O.:720] Out: 3300 [Other:3300]   Intake/Output this shift:     Physical Exam: General: NAD,   Head: Normocephalic, atraumatic. Moist oral mucosal membranes  Eyes: Anicteric,   Neck: Supple, trachea midline  Lungs:  Plainedge oxygen, mild scattered rhonchi  Heart: Regular rate and rhythm  Abdomen:  Soft, nontender,   Extremities:  no peripheral edema.  Neurologic: Nonfocal, moving all four extremities  Skin: No lesions  Access: Left AVF     Basic Metabolic Panel:  Recent Labs Lab 07/18/15 0655 07/23/15 0857 07/24/15 0527  NA 143 141 142  K 4.9 4.8 4.6  CL 102 101 101  CO2 29 28 33*  GLUCOSE 118* 145* 107*  BUN 64* 51* 29*  CREATININE 4.58* 4.60* 3.16*  CALCIUM 8.7* 8.5* 8.5*    Liver Function Tests:  Recent Labs Lab 07/23/15 0857  AST 18  ALT 6*  ALKPHOS 87  BILITOT 0.4  PROT 6.2*  ALBUMIN 2.8*   No results for input(s): LIPASE, AMYLASE in the last 168 hours. No results for input(s): AMMONIA in the last 168 hours.  CBC:  Recent Labs Lab 07/18/15 0655 07/23/15 0857 07/24/15 0527  WBC 9.5 7.0 6.6  NEUTROABS 7.5* 4.3   --   HGB 8.0* 8.1* 7.8*  HCT 25.1* 26.0* 24.0*  MCV 92.2 92.1 91.4  PLT 205 237 204    Cardiac Enzymes:  Recent Labs Lab 07/18/15 0655 07/23/15 0857  TROPONINI 0.04* 0.03    BNP: Invalid input(s): POCBNP  CBG:  Recent Labs Lab 07/23/15 0854  GLUCAP 153*    Microbiology: Results for orders placed or performed during the hospital encounter of 07/23/15  MRSA PCR Screening     Status: None   Collection Time: 07/23/15  1:45 PM  Result Value Ref Range Status   MRSA by PCR NEGATIVE NEGATIVE Final    Comment:        The GeneXpert MRSA Assay (FDA approved for NASAL specimens only), is one component of a comprehensive MRSA colonization surveillance program. It is not intended to diagnose MRSA infection nor to guide or monitor treatment for MRSA infections.     Coagulation Studies: No results for input(s): LABPROT, INR in the last 72 hours.  Urinalysis: No results for input(s): COLORURINE, LABSPEC, PHURINE, GLUCOSEU, HGBUR, BILIRUBINUR, KETONESUR, PROTEINUR, UROBILINOGEN, NITRITE, LEUKOCYTESUR in the last 72 hours.  Invalid input(s): APPERANCEUR    Imaging: Dg Chest Port 1 View  07/23/2015  CLINICAL DATA:  Respiratory distress EXAM: PORTABLE CHEST 1 VIEW COMPARISON:  07/18/2015 FINDINGS: Normal heart size.  Mild diffuse edema identified. Right pleural effusion unchanged. No airspace consolidation noted. IMPRESSION: 1. Mild CHF. Electronically Signed   By: Kerby Moors M.D.   On: 07/23/2015 09:43     Medications:     . amiodarone  200 mg Oral Daily  . amLODipine  2.5 mg Oral Daily  . atorvastatin  40 mg Oral Daily  . budesonide-formoterol  2 puff Inhalation BID  . feeding supplement (NEPRO CARB STEADY)  237 mL Oral BID BM  . ferrous sulfate  325 mg Oral Q breakfast  . ipratropium-albuterol  3 mL Nebulization Q6H  . magnesium oxide  400 mg Oral QPM  . pantoprazole  40 mg Oral BID  . polyethylene glycol  17 g Oral Daily  . sevelamer carbonate  800 mg Oral  TID AC  . tiotropium  18 mcg Inhalation Daily   sodium chloride, sodium chloride, albuterol, nicotine polacrilex  Assessment/ Plan:  [name] is a 74 y.o. black male with end-stage ren history of small bowel obstruction in May 2016, GERD, COPD,bladder cancer, history of TURBT  [facility] TTS [place].   1. ESRD: emergent hemodialysis for acute respiratory failure requiring BiPap  . 3.3 L removed -  Now back to baseline - next HD routine treatment tomorrow - could potentially be discharged if OK with IM team  2. Anemia of chronic kidney disease: hemoglobin 7.8 - epo with treatment.  - iron supplements.   3. Secondary hyperparathyroidism: outpatient labs: 299, ca 8.7, phos 4.3 - sevelamer with meals.   4. Hypertension:  - amlodipine low dose.  - acceptable control   LOS: 1 [name] 10/31/20168:43 AM

## 2015-07-24 NOTE — Progress Notes (Signed)
RN spoke with Dr. Posey Pronto and made MD aware that patient has had 2 mushy stools today and per patient and family patient has been having ongoing loose stools for a month or more and the family wants to know what we can do about it. Dr. Posey Pronto stated "there is nothing we can do, tell him to by some lomotil over the counter to take at home."

## 2015-07-24 NOTE — Progress Notes (Signed)
RN spoke with Dr. Posey Pronto and asked her for a note for patient to be able to return to PT on Wednesday 07-26-15.  MD stated "i will make a note in my progress notes and you can print that."

## 2015-07-24 NOTE — Discharge Instructions (Signed)
[facility] appointment on August 11, 2015 at 11:00am with Darylene Price, [place]. Please call [phone] to reschedule.    Resume your HD on TTS as before  Patient may take lomotil over the counter for loose stools(follow directions on package), per Dr. Posey Pronto.   Patient may return to physical therapy on Wednesday 07-26-15, per Dr. Posey Pronto.         Per Dr. Posey Pronto, patient is to follow up with Dr. Netty Starring and have him update medications that patient is and is not taking at home.

## 2015-07-24 NOTE — Progress Notes (Signed)
A&Ox4, denies pain.  VSS. Discharge instructions given to and reviewed with patient and his POA.  Both patient and POA verbalized understanding of all discharge instructions including f/u care.  Patient left ICU by wheel chair with Ronalee Belts, orderly and Buck Mam, RN. Patient's cousin driving him home and family member who stays with patient will be there to help transfer patient to wheelchair and into house, per patients cousin who is POA.

## 2015-07-24 NOTE — Progress Notes (Signed)
Patient ID: [name], male   DOB: 03-12-1941, 74 y.o.   MRN: [id number] Pt can resume PT from [facility] November 2nd

## 2015-07-24 NOTE — Discharge Summary (Signed)
[facility] NAME: Ronel Rodeheaver    MR#:  [id number]  DATE OF BIRTH:  Jul 27, 1950  DATE OF ADMISSION:  07/23/2015 ADMITTING PHYSICIAN: Vaughan Basta, MD  DATE OF DISCHARGE: 10/73/16  PRIMARY CARE PHYSICIAN: Dion Body, MD    ADMISSION DIAGNOSIS:  Shortness of breath [R06.02] Respiratory distress [R06.00]  DISCHARGE DIAGNOSIS:  Acute pulmonary edema due to missing HD-resolved  SECONDARY DIAGNOSIS:   Past Medical History  Diagnosis Date  . PAF (paroxysmal atrial fibrillation) ([place])     a. new onset 12/2014 in the setting of UTI, sepsis, hypotension, and anemia; b. not on long term anticoagulation given anemia; c. family aware of stroke risk, they are ok with this; d. on amiodarone   . Chronic combined systolic and diastolic CHF (congestive heart failure) ([place])     a. echo 12/2014: EF 25-30%, anterior wall wall motion abnormalities; b. planned ischemic evaluation once patient is stable medically; c. echo 01/2015: EF 45-50%, no RWMA, mild AT, LA mildly dilated, mod pericardial effusion along LV free wall, no evidence of hemodynamic compromise  . ESRD on hemodialysis ([place])     a. Tuesday, Thursday, and Saturdays  . Anemia     a. baseline hgb ~ 8  . History of small bowel obstruction     a. 01/2015  . History of septic shock     a. 01/2015  . GERD (gastroesophageal reflux disease)   . Allergic rhinitis   . COPD (chronic obstructive pulmonary disease) ([place])   . Hypercholesteremia   . Cognitive communication deficit   . Magnesium deficiency   . Dementia   . 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)   . Incontinence   . Hydronephrosis   . Overactive bladder   . Detrusor sphincter dyssynergia   . Mini stroke ([place])   . Bladder cancer ([place])   . Prostate cancer ([place])   . Renal insufficiency   . Hypertension     HOSPITAL COURSE:  * Fluid overload due to missed hemodialysis yesterday Causing acute on chronic respiratory  failure with hypoxia- requiring BiPAP currently. -now on RA with sats 100%  underwent HD with 3300 cc UF  * End-stage renal disease on hemodialysis resume outpt HD schedule  * COPD with chronic respiratory failure requiring home oxygen at nighttime Currently no active wheezing, continue his home inhalers and nebulizers.  * Hypertension Blood pressure is stable continue home medications.  * Paroxysmal atrial fibrillation Heart rate is under control continue amiodarone,he is not on anticoagulation.  Overall stable D/c home CONSULTS OBTAINED:  Treatment Team:  Lavonia Dana, MD  DRUG ALLERGIES:   Allergies  Allergen Reactions  . Penicillins Hives, Itching and Swelling    Has patient had a PCN reaction causing immediate rash, facial/tongue/throat swelling, SOB or lightheadedness with hypotension: Yes Has patient had a PCN reaction causing severe rash involving mucus membranes or skin necrosis: No Has patient had a PCN reaction that required hospitalization No Has patient had a PCN reaction occurring within the last 10 years: No If all of the above answers are "NO", then may proceed with Cephalosporin use.    DISCHARGE MEDICATIONS:   Current Discharge Medication List    CONTINUE these medications which have CHANGED   Details  albuterol (PROVENTIL HFA;VENTOLIN HFA) 108 (90 BASE) MCG/ACT inhaler Inhale 2 puffs into the lungs every 4 (four) hours as needed for wheezing or shortness of breath. Qty: 1 Inhaler, Refills: 3      CONTINUE these medications  which have NOT CHANGED   Details  amiodarone (PACERONE) 200 MG tablet Take 1 tablet (200 mg total) by mouth daily. Qty: 30 tablet, Refills: 5    amLODipine (NORVASC) 2.5 MG tablet Take 2.5 mg by mouth daily.    budesonide-formoterol (SYMBICORT) 160-4.5 MCG/ACT inhaler Inhale 2 puffs into the lungs 2 (two) times daily.    albuterol (PROVENTIL) (2.5 MG/3ML) 0.083% nebulizer solution Take 3 mLs (2.5 mg total) by  nebulization every 4 (four) hours as needed for wheezing or shortness of breath. Qty: 75 mL, Refills: 12    atorvastatin (LIPITOR) 40 MG tablet Take 1 tablet (40 mg total) by mouth daily. Qty: 30 tablet, Refills: 1    chlorpheniramine-HYDROcodone (TUSSIONEX) 10-8 MG/5ML SUER Take 5 mLs by mouth every 12 (twelve) hours. Qty: 140 mL, Refills: 0    ferrous sulfate 325 (65 FE) MG tablet Take 1 tablet (325 mg total) by mouth daily with breakfast. Qty: 90 tablet, Refills: 0    guaiFENesin (MUCINEX) 600 MG 12 hr tablet Take 1 tablet (600 mg total) by mouth 2 (two) times daily. Qty: 20 tablet, Refills: 2    ipratropium-albuterol (DUONEB) 0.5-2.5 (3) MG/3ML SOLN Take 3 mLs by nebulization every 4 (four) hours as needed. Qty: 360 mL, Refills: 1    levofloxacin (LEVAQUIN) 500 MG tablet Take 1 tablet by mouth every other day.    magnesium oxide (MAG-OX) 400 MG tablet Take 400 mg by mouth every evening.     nicotine (NICOTROL) 10 MG inhaler Inhale 1 cartridge (1 continuous puffing total) into the lungs as needed for smoking cessation. Qty: 42 each, Refills: 0    Nutritional Supplements (FEEDING SUPPLEMENT, NEPRO CARB STEADY,) LIQD Take 237 mLs by mouth 2 (two) times daily between meals. Qty: 30 Can, Refills: 0    pantoprazole (PROTONIX) 40 MG tablet Take 1 tablet (40 mg total) by mouth 2 (two) times daily. Qty: 30 tablet, Refills: 0    polyethylene glycol (MIRALAX / GLYCOLAX) packet Take 17 g by mouth daily. Qty: 14 each, Refills: 0    predniSONE (DELTASONE) 20 MG tablet Take 3 tablets (60 mg total) by mouth daily with breakfast. Taper by 20 mg daily then stop Qty: 4 tablet, Refills: 0    sevelamer carbonate (RENVELA) 800 MG tablet Take 800 mg by mouth 3 (three) times daily before meals.     tiotropium (SPIRIVA) 18 MCG inhalation capsule Place 1 capsule (18 mcg total) into inhaler and inhale daily. Qty: 30 capsule, Refills: 12        If you experience worsening of your admission  symptoms, develop shortness of breath, life threatening emergency, suicidal or homicidal thoughts you must seek medical attention immediately by calling 911 or calling your MD immediately  if symptoms less severe.  You Must read complete instructions/literature along with all the possible adverse reactions/side effects for all the Medicines you take and that have been prescribed to you. Take any new Medicines after you have completely understood and accept all the possible adverse reactions/side effects.   Please note  You were cared for by a hospitalist during your hospital stay. If you have any questions about your discharge medications or the care you received while you were in the hospital after you are discharged, you can call the unit and asked to speak with the hospitalist on call if the hospitalist that took care of you is not available. Once you are discharged, your primary care physician will handle any further medical issues. Please note that NO  REFILLS for any discharge medications will be authorized once you are discharged, as it is imperative that you return to your primary care physician (or establish a relationship with a primary care physician if you do not have one) for your aftercare needs so that they can reassess your need for medications and monitor your lab values. Today   SUBJECTIVE   Feels a lot better  VITAL SIGNS:  Blood pressure 114/61, pulse 81, temperature 97.9 F (36.6 C), temperature source Axillary, resp. rate 21, height 6\' 2"  (1.88 m), weight 87.8 kg (193 lb 9 oz), SpO2 100 %.  I/O:   Intake/Output Summary (Last 24 hours) at 07/24/15 1056 Last data filed at 07/24/15 1028  Gross per 24 hour  Intake   1677 ml  Output   3300 ml  Net  -1623 ml    PHYSICAL EXAMINATION:  GENERAL:  74 y.o.-year-old patient lying in the bed with no acute distress. Chronically ill EYES: Pupils equal, round, reactive to light and accommodation. No scleral icterus. Extraocular  muscles intact.  HEENT: Head atraumatic, normocephalic. Oropharynx and nasopharynx clear.  NECK:  Supple, no jugular venous distention. No thyroid enlargement, no tenderness.  LUNGS: Normal breath sounds bilaterally, no wheezing, rales,rhonchi or crepitation. No use of accessory muscles of respiration.  CARDIOVASCULAR: S1, S2 normal. No murmurs, rubs, or gallops.  ABDOMEN: Soft, non-tender, non-distended. Bowel sounds present. No organomegaly or mass.  EXTREMITIES: No pedal edema, cyanosis, or clubbing.  NEUROLOGIC: Cranial nerves II through XII are intact. Muscle strength 5/5 in all extremities. Sensation intact. Gait not checked.  PSYCHIATRIC: The patient is alert and oriented x 3.  SKIN: No obvious rash, lesion, or ulcer.   DATA REVIEW:   CBC   Recent Labs Lab 07/24/15 0527  WBC 6.6  HGB 7.8*  HCT 24.0*  PLT 204    Chemistries   Recent Labs Lab 07/23/15 0857 07/24/15 0527  NA 141 142  K 4.8 4.6  CL 101 101  CO2 28 33*  GLUCOSE 145* 107*  BUN 51* 29*  CREATININE 4.60* 3.16*  CALCIUM 8.5* 8.5*  AST 18  --   ALT 6*  --   ALKPHOS 87  --   BILITOT 0.4  --     Microbiology Results   Recent Results (from the past 240 hour(s))  MRSA PCR Screening     Status: None   Collection Time: 07/23/15  1:45 PM  Result Value Ref Range Status   MRSA by PCR NEGATIVE NEGATIVE Final    Comment:        The GeneXpert MRSA Assay (FDA approved for NASAL specimens only), is one component of a comprehensive MRSA colonization surveillance program. It is not intended to diagnose MRSA infection nor to guide or monitor treatment for MRSA infections.     RADIOLOGY:  Dg Chest Port 1 View  07/23/2015  CLINICAL DATA:  Respiratory distress EXAM: PORTABLE CHEST 1 VIEW COMPARISON:  07/18/2015 FINDINGS: Normal heart size. Mild diffuse edema identified. Right pleural effusion unchanged. No airspace consolidation noted. IMPRESSION: 1. Mild CHF. Electronically Signed   By: Kerby Moors  M.D.   On: 07/23/2015 09:43     Management plans discussed with the patient, family and they are in agreement.  CODE STATUS:     Code Status Orders        Start     Ordered   07/23/15 1504  Full code   Continuous     07/23/15 1503    Advance Directive Documentation  Most Recent Value   Type of Advance Directive  Healthcare Power of Attorney   Pre-existing out of facility DNR order (yellow form or pink MOST form)     "MOST" Form in Place?        TOTAL TIME TAKING CARE OF THIS PATIENT: 40 minutes.    Maryann Mccall M.D on 07/24/2015 at 10:56 AM  Between 7am to 6pm - Pager - [phone] After 6pm go to [email or web address] - password [email or web address] [facility]  Office  [phone]  CC: Primary care physician; Dion Body, MD

## 2015-07-24 NOTE — Care Management Note (Signed)
Case Management Note  Patient Details  Name: [name] MRN: [id number] Date of Birth: July 12, 1941  Subjective/Objective:   RNCM consult received due to home O2. Met with patient at bedside.  He uses home O2 through [facility] at night and PRN during the day. He denies issues with O2. Denies issues with transportation, medications or medical care. PCP is Dr. Netty Starring. No acute needs identified. Case closed                 Action/Plan:   Expected Discharge Date:                  Expected Discharge Plan:  Home/Self Care  In-House Referral:     Discharge planning Services  CM Consult  Post Acute Care Choice:    Choice offered to:     DME Arranged:    DME Agency:     HH Arranged:    [place] Agency:     Status of Service:  Completed, signed off  Medicare Important Message Given:    Date Medicare IM Given:    Medicare IM give by:    Date Additional Medicare IM Given:    Additional Medicare Important Message give by:     If discussed at [place] of Stay Meetings, dates discussed:    Additional Comments:  Jolly Mango, RN 07/24/2015, 1:12 PM

## 2015-07-26 ENCOUNTER — Ambulatory Visit: Payer: Medicare Other | Attending: Family Medicine | Admitting: Physical Therapy

## 2015-07-26 DIAGNOSIS — Z7409 Other reduced mobility: Secondary | ICD-10-CM | POA: Insufficient documentation

## 2015-07-26 DIAGNOSIS — R29898 Other symptoms and signs involving the musculoskeletal system: Secondary | ICD-10-CM | POA: Insufficient documentation

## 2015-07-28 LAB — CULTURE, BLOOD (ROUTINE X 2)
CULTURE: NO GROWTH
Culture: NO GROWTH

## 2015-07-31 ENCOUNTER — Encounter: Payer: Self-pay | Admitting: Physical Therapy

## 2015-07-31 ENCOUNTER — Ambulatory Visit: Payer: Medicare Other | Admitting: Physical Therapy

## 2015-07-31 DIAGNOSIS — Z7409 Other reduced mobility: Secondary | ICD-10-CM

## 2015-07-31 DIAGNOSIS — R29898 Other symptoms and signs involving the musculoskeletal system: Secondary | ICD-10-CM | POA: Diagnosis present

## 2015-07-31 NOTE — Therapy (Signed)
[place] MAIN [facility] SERVICES [address] [place], [place], [address] Phone: [phone]   Fax:  [phone]  Physical Therapy Treatment  Patient Details  Name: [name] MRN: [id number] Date of Birth: 1940-10-19 No Data Recorded  Encounter Date: 07/31/2015      PT End of Session - 07/31/15 1247    Visit Number 5   Number of Visits 25   Date for PT Re-Evaluation 09/25/15   Authorization Type 5   Authorization Time Period 10   PT Start Time 1020   PT Stop Time 1100   PT Time Calculation (min) 40 min   Equipment Utilized During Treatment Gait belt   Activity Tolerance Patient tolerated treatment well;Patient limited by fatigue   Behavior During Therapy WFL for tasks assessed/performed      Past Medical History  Diagnosis Date  . PAF (paroxysmal atrial fibrillation) ([place])     a. new onset 12/2014 in the setting of UTI, sepsis, hypotension, and anemia; b. not on long term anticoagulation given anemia; c. family aware of stroke risk, they are ok with this; d. on amiodarone   . Chronic combined systolic and diastolic CHF (congestive heart failure) ([place])     a. echo 12/2014: EF 25-30%, anterior wall wall motion abnormalities; b. planned ischemic evaluation once patient is stable medically; c. echo 01/2015: EF 45-50%, no RWMA, mild AT, LA mildly dilated, mod pericardial effusion along LV free wall, no evidence of hemodynamic compromise  . ESRD on hemodialysis ([place])     a. Tuesday, Thursday, and Saturdays  . Anemia     a. baseline hgb ~ 8  . History of small bowel obstruction     a. 01/2015  . History of septic shock     a. 01/2015  . GERD (gastroesophageal reflux disease)   . Allergic rhinitis   . COPD (chronic obstructive pulmonary disease) ([place])   . Hypercholesteremia   . Cognitive communication deficit   . Magnesium deficiency   . Dementia   . 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 Incontinence   . Hydronephrosis   . Overactive bladder    . Detrusor sphincter dyssynergia   . Mini stroke ([place])   . Bladder cancer ([place])   . Prostate cancer ([place])   . Renal insufficiency   . Hypertension     Past Surgical History  Procedure Laterality Date  . Cystoscopy w/ ureteral stent placement    . Transurethral resection of bladder tumor with gyrus (turbt-gyrus)    . Ureteral stent placement    . Prostate surgery      Removal  . Av fistula placement      left arm    There were no vitals filed for this visit.  Visit Diagnosis:  Weakness of both legs  Decreased mobility and endurance      Subjective Assessment - 07/31/15 1245    Subjective Patient reports that he is "okay" upon arrival to PT, but states that he feels more winded than he usually does. He states that he is doing some of the home exercises, but not very consistently.     How long can you sit comfortably? as long as needed a   How long can you stand comfortably? less than a minute    How long can you walk comfortably? unable to stand at this time.    Patient Stated Goals Be able walk with RW.     Currently in Pain? No/denies  Treatment:   Seated hip flexion red tband 2x12 Seated hip abduction red tband 2x 12 Seated knee extension no resistance 2x 12  Chair press ups 2x 10   Supine bridges 2x10 Supine knee extension stretch 3 x 1 minute each LE   Min cues for improved ROM, proper speed of movement to increase strengthening aspect of exercises.   Slide board transfer training. Pt provided min A and mod cues for proper hand placement and sequencing of movements  Scooting EOB x 10 repetitions L and R. Verbal and tactile instruction for improved weight shift, and sequencing of movements to incorporate increased LE involvement.  Supine scooting towards HOB. X 5 repetitions with instruction for improved sequencing of movements and increased use of UE.      Patient was noted to have decreased Sa02 to<88% x 3 with therex and mobility training. Was able  to increase >91% with rest less than 1 minute.   PT instructed patient in the importance for HS flexibility and improved LE strength to increase mobility increase potential to stand.                          PT Education - 07/31/15 1246    Education provided Yes   Education Details improved pursed lip breathing. scooting EOB and in supine. importance of HEP    Person(s) Educated Patient   Methods Explanation;Demonstration;Tactile cues;Verbal cues   Comprehension Verbalized understanding;Returned demonstration;Verbal cues required;Tactile cues required             PT Long Term Goals - 07/04/15 0732    PT LONG TERM GOAL #1   Title Patient will be independent with HEP to improve flexibility and LE strength to improve independence within the home by 09/25/15   Time 12   Period Weeks   Status New   PT LONG TERM GOAL #2   Title Patient will be able to complete 5xsit to stand with use of UE in less 30seconds to indicate improved functional capacity at home by 09/25/15   Time 12   Period Weeks   Status New   PT LONG TERM GOAL #3   Title Patient will be able to ambulate 50 ft with RW to improve access to home environment by 09/25/15    Time 12   Period Weeks   Status New   PT LONG TERM GOAL #4   Title Patient will improve muscle strength to at least 4+/5 to allow him to perform all transfer at modified independent level and be able to walk within home by 09/25/15   Time 12   Period Weeks   Status New               Plan - 07/31/15 1247    Clinical Impression Statement Patient instructed in mild LE strengthening as well as bed mobility on EOB and in supine. PT was required to provide moderate instruction for proper sequencing movements and to increase pursed lip breathing pattern to reduce O2 de-sat. Patient was noted to de-sat <88% x 3 with therex and mobility training throughout PT treatment session, which took <1 minute to increase >91%. Continued skilled PT is  recommended to improve LE strength and LE flexibility, as well as increase endurance to allow improved independence with ADLs.     Pt will benefit from skilled therapeutic intervention in order to improve on the following deficits Decreased activity tolerance;Decreased balance;Decreased endurance;Decreased mobility;Decreased range of motion;Decreased safety awareness;Decreased strength;Difficulty walking;Hypomobility;Increased fascial  restricitons;Impaired perceived functional ability;Impaired flexibility;Improper body mechanics;Postural dysfunction   Rehab Potential Fair   Clinical Impairments Affecting Rehab Potential Positive: Patient is motivated to improve, family support. Negative: chronic weakness, muscle contractures, comorbidities.    PT Frequency 2x / week   PT Duration 12 weeks   PT Treatment/Interventions ADLs/Self Care Home Management;Aquatic Therapy;Cryotherapy;Moist Heat;Ultrasound;DME Instruction;Gait training;Stair training;Functional mobility training;Therapeutic activities;Therapeutic exercise;Balance training;Neuromuscular re-education;Patient/family education;Wheelchair mobility training;Manual techniques;Passive range of motion;Dry needling;Energy conservation   PT Next Visit Plan mobility and strengthening.    PT Home Exercise Plan see patient instrucitons.    Consulted and Agree with Plan of Care Patient        Problem List Patient Active Problem List   Diagnosis Date Noted  . Fluid overload 07/23/2015  . Generalized weakness 05/08/2015  . ESRD on dialysis ([place]) 05/08/2015  . Other emphysema ([place]) 05/08/2015  . Tobacco use disorder 05/08/2015  . Hypokalemia 05/08/2015  . Pressure ulcer 05/05/2015  . Symptomatic anemia 05/04/2015  . CAFL (chronic airflow limitation) ([place]) 04/10/2015  . Malnutrition of moderate degree ([place]) 03/22/2015  . Abdominal pain   . Pneumonia 03/20/2015  . History of sepsis 02/27/2015  . Incontinence 02/27/2015  . Persistent atrial  fibrillation ([place]) 02/27/2015  . Preoperative cardiovascular examination 02/27/2015  . GI bleed 02/13/2015  . Ileus ([place])   . Chronic combined systolic and diastolic CHF (congestive heart failure) ([place])   . ESRD on hemodialysis ([place])   . Anemia   . History of small bowel obstruction   . Atrial fibrillation with RVR ([place]) 01/21/2015  . Chronic kidney disease (CKD), stage IV (severe) ([place]) 03/01/2014  . Essential (primary) hypertension 03/01/2014  . BP (high blood pressure) 03/01/2014  . 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 Absence of bladder continence 03/01/2014  . Chronic kidney disease requiring chronic dialysis ([place]) 03/01/2014  . Bladder compliance low 11/11/2012  . Cystitis 11/11/2012  . Acute cystitis 10/15/2012  . AAA (abdominal aortic aneurysm) ([place]) 08/27/2012  . 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 Chronic kidney disease (CKD), stage V ([place]) 08/27/2012  . Hydronephrosis 08/27/2012  . Incomplete bladder emptying 08/27/2012  . Bladder cancer ([place]) 08/27/2012  . CA of prostate ([place]) 08/27/2012  . Bone metastases ([place]) 08/27/2012  . Infection of urinary tract 08/27/2012   Barrie Folk SPT 07/31/2015   3:21 PM  This entire session was performed under direct supervision and direction of a licensed therapist . I have personally read, edited and approve of the note as written.  Hopkins,Margaret PT, DPT 07/31/2015, 3:21 PM  [place] MAIN [facility] SERVICES [address] [place], [place], [address] Phone: [phone]   Fax:  [phone]  Name: NAJEH CREDIT MRN: [id number] Date of Birth: Apr 03, 1941

## 2015-08-02 ENCOUNTER — Encounter: Payer: Self-pay | Admitting: Physical Therapy

## 2015-08-02 ENCOUNTER — Ambulatory Visit: Payer: Medicare Other | Admitting: Physical Therapy

## 2015-08-02 DIAGNOSIS — R29898 Other symptoms and signs involving the musculoskeletal system: Secondary | ICD-10-CM | POA: Diagnosis not present

## 2015-08-02 DIAGNOSIS — Z7409 Other reduced mobility: Secondary | ICD-10-CM

## 2015-08-02 NOTE — Patient Instructions (Addendum)
  Clam Shell 45 Degrees    Lying with hips and knees bent 45, one pillow between knees and ankles. Lift knee. Be sure pelvis does not roll backward. Do not arch back. Do _12__ times, each leg, __2_ times per day.  http://ss.exer.us/74   Copyright  VHI. All rights reserved.  FLEXION: Sitting - Resistance Band (Active)    Sit, both feet flat. Against yellow resistance band, lift right knee toward ceiling. Complete _2__ sets of __10_ repetitions. Perform _2_ sessions per day.  http://gtsc.exer.us/20   Copyright  VHI. All rights reserved.  (Home) Knee: Extension - Sitting    Anchor behind, sit with knee over edge of chair. Lift leg, straighten knee. Repeat ___10_ times per set. Do _2___ sets per session. Do __5__ sessions per week.   Copyright  VHI. All rights reserved.  FLEXION: Sitting - Resistance Band (Active)    Sit with right leg extended. Against yellow resistance band, bend knee and draw foot backward. Complete __2_ sets of __10_ repetitions. Perform _2__ sessions per day.  http://gtsc.exer.us/230   Copyright  VHI. All rights reserved.  Quad Set    Slowly tighten muscles on thigh of straight leg while counting out loud to __3__. Repeat with other leg. Repeat __10__ times. Do _2___ sessions per day.  http://gt2.exer.us/361   Copyright  VHI. All rights reserved.
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
## 2015-08-07 ENCOUNTER — Ambulatory Visit: Payer: Medicare Other | Admitting: Physical Therapy

## 2015-08-07 DIAGNOSIS — Z7409 Other reduced mobility: Secondary | ICD-10-CM

## 2015-08-07 DIAGNOSIS — R29898 Other symptoms and signs involving the musculoskeletal system: Secondary | ICD-10-CM
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
## 2015-08-09 ENCOUNTER — Ambulatory Visit: Payer: Medicare Other | Admitting: Physical Therapy

## 2015-08-09 ENCOUNTER — Encounter: Payer: Self-pay | Admitting: Physical Therapy

## 2015-08-09 DIAGNOSIS — R29898 Other symptoms and signs involving the musculoskeletal system: Secondary | ICD-10-CM

## 2015-08-09 DIAGNOSIS — Z7409 Other reduced mobility: Secondary | ICD-10-CM
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
## 2015-08-11 ENCOUNTER — Encounter: Payer: Self-pay | Admitting: Family

## 2015-08-11 ENCOUNTER — Ambulatory Visit: Payer: Medicare Other | Attending: Family | Admitting: Family

## 2015-08-11 VITALS — BP 120/63 | HR 78 | Resp 20 | Ht 74.0 in | Wt 204.0 lb

## 2015-08-11 DIAGNOSIS — Z79899 Other long term (current) drug therapy: Secondary | ICD-10-CM | POA: Insufficient documentation

## 2015-08-11 DIAGNOSIS — Z8673 Personal history of transient ischemic attack (TIA), and cerebral infarction without residual deficits: Secondary | ICD-10-CM | POA: Insufficient documentation

## 2015-08-11 DIAGNOSIS — Z880 Allergy status to penicillin: Secondary | ICD-10-CM | POA: Insufficient documentation

## 2015-08-11 DIAGNOSIS — Z992 Dependence on renal dialysis: Secondary | ICD-10-CM | POA: Diagnosis not present

## 2015-08-11 DIAGNOSIS — K219 Gastro-esophageal reflux disease without esophagitis: Secondary | ICD-10-CM | POA: Insufficient documentation

## 2015-08-11 DIAGNOSIS — I5032 Chronic diastolic (congestive) heart failure: Secondary | ICD-10-CM | POA: Diagnosis not present

## 2015-08-11 DIAGNOSIS — J449 Chronic obstructive pulmonary disease, unspecified: Secondary | ICD-10-CM | POA: Insufficient documentation

## 2015-08-11 DIAGNOSIS — I480 Paroxysmal atrial fibrillation: Secondary | ICD-10-CM | POA: Diagnosis not present

## 2015-08-11 DIAGNOSIS — F17200 Nicotine dependence, unspecified, uncomplicated: Secondary | ICD-10-CM

## 2015-08-11 DIAGNOSIS — Z87891 Personal history of nicotine dependence: Secondary | ICD-10-CM | POA: Insufficient documentation

## 2015-08-11 DIAGNOSIS — E7800 Pure hypercholesterolemia, unspecified: Secondary | ICD-10-CM | POA: Diagnosis not present

## 2015-08-11 DIAGNOSIS — N184 Chronic kidney disease, stage 4 (severe): Secondary | ICD-10-CM

## 2015-08-11 DIAGNOSIS — F0390 Unspecified dementia without behavioral disturbance: Secondary | ICD-10-CM | POA: Diagnosis not present

## 2015-08-11 DIAGNOSIS — I120 Hypertensive chronic kidney disease with stage 5 chronic kidney disease or end stage renal disease: Secondary | ICD-10-CM | POA: Insufficient documentation

## 2015-08-11 DIAGNOSIS — I129 Hypertensive chronic kidney disease with stage 1 through stage 4 chronic kidney disease, or unspecified chronic kidney disease: Secondary | ICD-10-CM | POA: Insufficient documentation

## 2015-08-11 DIAGNOSIS — I10 Essential (primary) hypertension: Secondary | ICD-10-CM

## 2015-08-11 NOTE — Patient Instructions (Signed)
Do not use salt on your food.
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
## 2015-08-14 ENCOUNTER — Ambulatory Visit: Payer: Medicare Other | Admitting: Physical Therapy

## 2015-08-16 ENCOUNTER — Encounter: Payer: Medicare Other | Admitting: Physical Therapy

## 2015-08-16 ENCOUNTER — Ambulatory Visit: Payer: Medicare Other | Admitting: Physical Therapy

## 2015-08-21 ENCOUNTER — Ambulatory Visit: Payer: Medicare Other | Admitting: Physical Therapy

## 2015-08-21 DIAGNOSIS — Z7409 Other reduced mobility: Secondary | ICD-10-CM

## 2015-08-21 DIAGNOSIS — R29898 Other symptoms and signs involving the musculoskeletal system: Secondary | ICD-10-CM | POA: Diagnosis not present

## 2015-08-21 NOTE — Therapy (Addendum)
[place] [facility]IN [facility] SERVICES [address] [place], [place], [address] Phone: [phone]   Fax:  [phone]  Physical Therapy  Wheelchair Assessment  Patient Details  Name: TYLIK TREESE MRN: [id number] Date of Birth: [birth year] Referring Provider: Dion Body, MD  Encounter Date: 08/21/2015      PT End of Session - 08/21/15 1023    Visit Number 9   Number of Visits 25   Date for PT Re-Evaluation 09/25/15   Authorization Type 3   Authorization Time Period 10   PT Start Time 0935   PT Stop Time 1020   PT Time Calculation (min) 45 min   Equipment Utilized During Treatment Gait belt   Activity Tolerance Patient tolerated treatment well;Patient limited by fatigue   Behavior During Therapy [facility] for tasks assessed/performed      Past Medical History  Diagnosis Date  . PAF (paroxysmal atrial fibrillation) ([place])     a. new onset 12/2014 in the setting of UTI, sepsis, hypotension, and anemia; b. not on long term anticoagulation given anemia; c. family aware of stroke risk, they are ok with this; d. on amiodarone   . Chronic combined systolic and diastolic CHF (congestive heart failure) ([place])     a. echo 12/2014: EF 25-30%, anterior wall wall motion abnormalities; b. planned ischemic evaluation once patient is stable medically; c. echo 01/2015: EF 45-50%, no RWMA, mild AT, LA mildly dilated, mod pericardial effusion along LV free wall, no evidence of hemodynamic compromise  . ESRD on hemodialysis ([place])     a. Tuesday, Thursday, and Saturdays  . Anemia     a. baseline hgb ~ 8  . History of small bowel obstruction     a. 01/2015  . History of septic shock     a. 01/2015  . GERD (gastroesophageal reflux disease)   . Allergic rhinitis   . COPD (chronic obstructive pulmonary disease) ([place])   . Hypercholesteremia   . Cognitive communication deficit   . Magnesium deficiency   . Dementia   . 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 Incontinence   .  Hydronephrosis   . Overactive bladder   . Detrusor sphincter dyssynergia   . Mini stroke ([place])   . Bladder cancer ([place])   . Prostate cancer ([place])   . Renal insufficiency   . Hypertension     Past Surgical History  Procedure Laterality Date  . Cystoscopy w/ ureteral stent placement    . Transurethral resection of bladder tumor with gyrus (turbt-gyrus)    . Ureteral stent placement    . Prostate surgery      Removal  . Av fistula placement      left arm    There were no vitals filed for this visit.  Visit Diagnosis:  Weakness of both legs  Decreased mobility and endurance    PATIENT INFORMATION: Name: Ricci Dirocco DOB: 05-14-2041                     Sex: M Date seen:  08/21/15      Time: 9:30AM   Address: [place], [place] Physician: Netty Starring, MD This evaluation/justification form will serve as the LMN for the following suppliers: __________________________ Supplier: [employer] Contact Person: Almeta Monas, Wess Botts Phone: [phone]   Seating Therapist: Norwood Levo. [place], PT, DPT Phone:   [phone]   Phone: [phone]    Spouse/Parent/Caregiver name:   Phone number:  Insurance/Payer:  [facility] Medicare  Reason for Referral: Needs power chair for mobility  Patient/Caregiver Goals: Get a power chair to be able to move around the house better  Patient was seen for face-to-face evaluation for new power wheelchair.  Also present was    Almeta Monas [employer]    to discuss recommendations and wheelchair options.   Further paperwork was completed and sent to vendor.  Patient appears to qualify for power mobility device at this time per objective findings.   MEDICAL HISTORY: Diagnosis:  ESRD on Dialysis , COPD uses home O2 2L occasionally as needed;                                  Primary Diagnosis: ESRD Onset: Diagnosis: ESRD, COPD, deconditioning   _0 Progressive Disease Relevant past and future surgeries: none   Height: 6'2" Weight: 210 pounds   Explain recent changes or trends in weight:  none  History including Falls: none in last 6 months    HOME ENVIRONMENT: _1 House  _2 Condo/town home  _3 Apartment  _4 Assisted Living    _5 Lives Alone _6  Lives with Others           Hours with caregiver: 24/7  _7 Home is accessible to patient           Stairs      _8 Yes _9  No     Ramp _10 Yes _11 No Comments:  Caregiver pushes patient in/out of home up/down ramp in manual chair;    COMMUNITY ADL: TRANSPORTATION: _12 Car    _13 Van    <VOJJKKXFGHWEXHBZ>_1<\/IRCVELFYBOFBPZWC>_58 Public Transportation    _15 Adapted w/c Lift    _16 Ambulance    _17 Other:       _18 Sits in wheelchair during transport  Employment/School:    Retired/disabled Specific requirements pertaining to mobility                                                     Other:                                     FUNCTIONAL/SENSORY PROCESSING SKILLS:  Handedness:   _19 Right     _20 Left    _21 NA  Comments:                                 Functional Processing Skills for Wheeled Mobility _22 Processing Skills are adequate for safe wheelchair operation  Areas of concern than may interfere with safe operation of wheelchair Description of problem   _23  Attention to environment      _24 Judgment      _25  Hearing  _26  Vision or visual processing      _27 Motor Planning  _28  Fluctuations in Behavior                                                   VERBAL COMMUNICATION: _29 WFL receptive _30  WFL expressive _31 Understandable  _32 Difficult to understand  _33 non-communicative _34  Uses an augmented communication device  CURRENT SEATING / MOBILITY: Current Mobility Base:  _35 None _36 Dependent _37 Manual _38 Scooter _39 Power  Type of Control:                       Manufacturer:       [id number]                  Size:                         Age:  6 years;                          Current Condition of Mobility Base: lock not working on left side, general wear and tear                                                                                                                     Current Wheelchair components:                                                                                                                                   Describe posture in present seating system:   Slightly slumped                                                                         SENSATION and SKIN ISSUES: Sensation _0 Intact  _1 Impaired _2 Absent  Level of sensation:                         Pressure Relief: Able to perform effective pressure relief :    _3 Yes  _4  No Method:  Scoots around in chair, changes position;                                                                             If not, Why?:  Skin Issues/Skin Integrity Current Skin Issues  _0 Yes _1 No _2 Intact _3  Red area_4  Open Area  _5 Scar Tissue _6 At risk from prolonged sitting Where                              History of Skin Issues  _7 Yes _8 No Where  Has red spot on sacral area                                       When 1-2 weeks ago                                              Hx of skin flap surgeries  _9 Yes _10 No Where                                              When                                                  Limited sitting tolerance _11 Yes _12 No Hours spent sitting in wheelchair daily:8+                                                         Complaint of Pain:  Please describe:   denies                                                                                                          Swelling/Edema:   none                                                                                                                                          ADL STATUS (in reference to wheelchair use):  Indep Assist Unable Indep with Equip Not assessed Comments  Dressing  X                                        Able to get shirt on, needs help with lower body dressing                   Eating    X                                                                                                                           Toileting              X                                                   Needs help with transfers;                                                          Bathing             X                                                                                                                         Grooming/ Hygiene        X                                                                                                                      Meal Prep               X  able to make sandwich, cook an egg on the stove from wheelchair; unable to prepare a whole meal;                                                          IADLS                X                                                                                                  Bowel Management: _0 Continent  _1 Incontinent  _2 Accidents Comments:    Aware when needs to go to the bathroom but sometimes doesn't get there in time.                                              Bladder Management: _3 Continent  _4 Incontinent  _5 Accidents Comments:not urinating because of poor kidney function                                              WHEELCHAIR SKILLS: Manual w/c Propulsion: _6 UE or LE strength and endurance sufficient to participate in ADLs using manual wheelchair Arm : _7 left _8 right   _9 Both      Distance: 50 feet                                      Foot:  _10 left _11 right   _12 Both  Operate Scooter: _13  Strength, hand grip, balance and transfer appropriate for use _14 Living environment is accessible for use of scooter  Operate Power w/c:  _15  Std. Joystick   _16  Alternative Controls Indep _17  Assist _18  Dependent/ unable _19  N/A _20   _21 Safe          _22  Functional      Distance:               Bed confined without wheelchair _23  Yes _24  No   STRENGTH/RANGE OF MOTION:  Range of Motion Strength   Shoulder          Decreased flexion, WFL                                               ABD: R: 3-/5, L: 3/5, flexion: R: 3-/5, L: 3/5, IR/ER: R:  3-/5   L:    3+/5  Elbow          WFL                                           Grossly 4/5                                                           Wrist/Hand          WFL                                                         R: 30#, L: 35#                                                                      Hip          Decreased hip flexion against gravity; WFL                                                    R: 3-/5, L: 3/5  Knee      Knee flexor contracture: R: 30 degrees, L: 20 degrees                                                          R: 3-/5, L: 3/5                                                              Ankle WFL Grossly 3/5    MOBILITY/BALANCE:  _0  Patient is totally dependent for mobility                                                                                               Balance Transfers Ambulation  Sitting Balance: Standing Balance: _1  Independent _2  Independent/Modified Independent  _3  WFL     _4  WFL _5  Supervision _6  Supervision  _7   Uses UE for balance  _0  Supervision _1  Min Assist _2  Ambulates with Assist                           _3  Min Assist _4  Min assist _5  Mod Assist _6  Ambulates with Device:      _7  RW  _8  StW  _9  Cane  _10                 _11  Mod Assist _12  Mod assist _13  Max assist   _14  Max Assist _15  Max assist _16  Dependent _17  Indep. Short Distance Only  _18  Unable _19  Unable _20  Lift / Sling Required Distance (in feet)                             _21  Sliding board _22  Unable to Ambulate: (Explain: has weakness in BLE insufficient to walk)  Cardio Status:  _23 Intact  _24  Impaired   _25  NA                              Respiratory Status:  _26 Intact   _27 Impaired   _28 NA     Patient short of breath during conversation; resting SPO2 97%, after propelling self 50 feet  in manual chair with BUE/BLE, SPO2 dropped to 88% with increased shortness of breath;                                 Orthotics/Prosthetics:  none                                                                       Comments (Address manual vs power w/c vs scooter):    Patient currently has a manual wheelchair which he is able to propel with BUE and BLE. However patient has COPD and is short of breath during minimal mobility. He fatigued after propelling self 50 feet with drop in SPO2 to 88%. Patient would benefit from a power chair as he needs special cushions to assist with posture and weight distribution to reduce skin breakdown. Also a power chair would assist with tighter turns in the home as compared to a scooter. Patient also requires a power chair to assist with transfers as a scooter is not as safe with sliding board transfers. We are also requesting swing away leg rests to assist with BLE knee flexor contractures.                                             Anterior / Posterior Obliquity Rotation-Pelvis                               PELVIS    _29  _30  _31   Neutral Posterior Anterior  _32  _33  _34   WFL Rt elev Lt elev  _35  _36  _37   WFL Right Left  Anterior Anterior     _0  Fixed _1  Other _2  Partly Flexible _3  Flexible   _4  Fixed _5  Other _6  Partly Flexible  _7  Flexible  _8  Fixed _9  Other _10  Partly Flexible  _11  Flexible   TRUNK  _12  _13  _14   [facility]  Thoracic  Lumbar  Kyphosis Lordosis  _15  _16  _17   WFL Convex Convex  Right Left _18 c-curve _19 s-curve                _20 multiple  _21  Neutral _22  Left-anterior _23  Right-anterior     _24  Fixed _25  Flexible _26  Partly Flexible  Other  _27  Fixed _28  Flexible _29  Partly Flexible_30  Other  _31  Fixed            _32  Flexible _33  Partly Flexible _34  Other    Position Windswept                   HIPS          _35            _36               _37  Neutral   Abduct        ADduct         _38           _39            _40   Neutral Right           Left       _41  Fixed _42  Subluxed _43  Partly Flexible                _44  Dislocated _45  Flexible  _46  Fixed _47  Other _48  Partly Flexible  _49  Flexible                 Foot Positioning Knee Positioning                              _50  WFL  _51 Lt _52 Rt _53  WFL  _54 Lt _55 Rt    KNEES ROM concerns: ROM concerns:    & Dorsi-Flexed _56 Lt _57 Rt     Has BLE knee flexor contractures. RLE: 30 degrees, LLE: 20 degrees in knees                              FEET Plantar Flexed _58 Lt _59 Rt      Inversion                 _60 Lt _61 Rt      Eversion                 _62 Lt _63 Rt     HEAD _64  Functional _65  Good Head Control                     & _66  Flexed         _67  Extended _68  Adequate Head Control    NECK _69  Rotated  Lt  _70  Lat Flexed Lt _71  Rotated  Rt _72  Lat Flexed Rt _73  Limited Head Control     _74  Cervical Hyperextension _75  Absent  Head Control     SHOULDERS ELBOWS WRIST& HAND                                Left     Right    Left  Right    Left     Right   U/E _0 Functional           _1 Functional                                 _2 Fisting             _3 Fisting      _4 elev   _5 dep      _6 elev   _7 dep       _8 pro -_9 retract     _10 pro  _11 retract _12 subluxed             _13 subluxed          Goals for Wheelchair Mobility  _14  Independence with mobility in the home with motor related ADLs (MRADLs)  _15  Independence with MRADLs in the community _16  Provide dependent mobility  _17  Provide recline     _18 Provide tilt   Goals for Seating system _19  Optimize pressure distribution _20  Provide support needed to facilitate function or safety _21  Provide corrective forces to assist with maintaining or improving posture _22  Accommodate client's posture:   current seated postures and positions are not flexible or will not tolerate corrective forces _23  Client to be independent with relieving pressure in the wheelchair _24 Enhance physiological function such as breathing, swallowing, digestion  Simulation ideas/Equipment trials:   Had  patient try pushing self in manual wheelchair. He was limited to 50 feet with increased shortness of breath and drop of SPO2 from 97% to 88%                                                                                           State why other equipment was unsuccessful:                                                                         MOBILITY BASE RECOMMENDATIONS and JUSTIFICATION: MOBILITY COMPONENT JUSTIFICATION  Manufacturer:           Model:             Jazzy 600 ES Size: Width           Seat Depth             _25 provide transport from point A to B _26 promote Indep mobility  _27 is not a safe, functional ambulator _28 walker or cane inadequate _29 non-standard width/depth necessary to accommodate anatomical measurement _30                             _31 Manual Mobility Base _32 non-functional ambulator    _33 Scooter/POV  _34 can safely operate  _35 can safely transfer   _36 has adequate trunk stability  _37 cannot functionally propel manual w/c  _38 Power Mobility Base  _39 non-ambulatory  _40 cannot functionally propel manual wheelchair  _41  cannot functionally  and safely operate scooter/POV _0 can safely operate and willing to  _1 Stroller Base _2 infant/child  _3 unable to propel manual wheelchair _4 allows for growth _5 non-functional ambulator _6 non-functional UE _7 Indep mobility is not a goal at this time  _8 Tilt  _9 Forward _10 Backward _11 Powered tilt  _12 Manual tilt  _13 change position against gravitational force on head and shoulders  _14 change position for pressure relief/cannot weight shift _15 transfers  _16 management of tone _17 rest periods _18 control edema _19 facilitate postural control  _20                                       _21 Recline  _22 Power recline on power base _23 Manual recline on manual base  _24 accommodate femur to back angle  _25 bring to full recline for ADL care  _26 change position for pressure relief/cannot weight shift _27 rest periods _28 repositioning for transfers or  clothing/diaper /catheter changes _29 head positioning  _30 Lighter weight required _31 self- propulsion  _32 lifting _33                                                 _34 Heavy Duty required _35 user weight greater than 250# _36 extreme tone/ over active movement _37 broken frame on previous chair _38                                     _39  Back  _40  Angle Adjustable _41  Custom molded                           _42 postural control _43 control of tone/spasticity _44 accommodation of range of motion _45 UE functional control _46 accommodation for seating system _47                                          _48 provide lateral trunk support _49 accommodate deformity _50 provide posterior trunk support _51 provide lumbar/sacral support _52 support trunk in midline _53 Pressure relief over spinal processes  _54  Seat Cushion                       _55 impaired sensation  _56 decubitus ulcers present _57 history of pressure ulceration _58 prevent pelvic extension _59 low maintenance  _60 stabilize pelvis  _61 accommodate obliquity _62 accommodate multiple deformity _63 neutralize lower extremity position _64 increase pressure distribution _65                                           _66  Pelvic/thigh support  _67  Lateral thigh guide _68  Distal medial pad  _69  Distal lateral pad _70  pelvis in neutral _71 accommodate pelvis _72  position upper legs _73  alignment _74  accommodate ROM _75  decrease adduction _76 accommodate tone _77 removable for transfers _78 decrease abduction  _79  Lateral trunk Supports _80  Lt     _81  Rt _82 decrease lateral trunk leaning _83 control tone _84 contour for increased contact _85 safety  _86 accommodate asymmetry _87                                                 _88   Mounting hardware  _0 lateral trunk supports  _1 back   _2 seat _3 headrest      _4  thigh support _5 fixed   _6 swing away _7 attach seat platform/cushion to w/c frame _8 attach back cushion to w/c frame _9 mount postural supports _10 mount headrest  _11 swing medial thigh support  away _12 swing lateral supports away for transfers  _13                                                     Armrests  _14 fixed _15 adjustable height _16 removable   _17 swing away  _18 flip back   _19 reclining _20 full length pads _21 desk    _22 pads tubular  _23 provide support with elbow at 90   _24 provide support for w/c tray _25 change of height/angles for variable activities _26 remove for transfers _27 allow to come closer to table top _28 remove for access to tables _29                                               Hangers/ Leg rests  _30 60 _31 70 _32 90 _33 elevating _34 heavy duty  _35 articulating _36 fixed _37 lift off _38 swing away     _39 power _40 provide LE support  _41 accommodate to hamstring tightness _42 elevate legs during recline   _43 provide change in position for Legs _44 Maintain placement of feet on footplate _45 durability _46 enable transfers _47 decrease edema _48 Accommodate lower leg length _49                                         Foot support Footplate    <CBJSEGBTDVVOHYWV>_3<\/XTGGYIRSWNIOEVOJ>_50 Lt  _51  Rt  _52  Center mount _53 flip up     _54 depth/angle adjustable _55 Amputee adapter    _56  Lt     _57  Rt _58 provide foot support _59 accommodate to ankle ROM _60 transfers _61 Provide support for residual extremity _62  allow foot to go under wheelchair base _63  decrease tone  _64                                                 _65  Ankle strap/heel loops _66 support foot on foot support _67 decrease extraneous movement _68 provide input to heel  _69 protect foot  Tires: _70 pneumatic  _71 flat free inserts  _72 solid  _73 decrease maintenance  _74 prevent frequent flats _75 increase shock absorbency _76 decrease pain from road shock _77 decrease spasms from road shock _78                                              _79  Headrest  _80 provide posterior head support _81 provide posterior neck support _82 provide lateral head support _83 provide anterior head support _84 support during tilt and recline _85 improve feeding   _86 improve respiration _87 placement of  switches _88 safety  _89 accommodate ROM  _90 accommodate tone _91 improve visual orientation  _92  Anterior chest strap _93  Vest _94  Shoulder retractors  _95 decrease forward movement of shoulder _96 accommodation of TLSO _97 decrease forward movement of trunk _98 decrease shoulder elevation _99 added abdominal support _100 alignment _101 assistance with shoulder control  _102   Pelvic Positioner _0 Belt _1 SubASIS bar _2 Dual Pull _3 stabilize tone _4 decrease falling out of chair/ **will not Decrease potential for sliding due to pelvic tilting _5 prevent excessive rotation _6 pad for protection over boney prominence _7 prominence comfort _8 special pull angle to control rotation _9                                                  Upper ExtremitySupport _10 L   _11  R _12 Arm trough    _13 hand support _14  tray       _15 full tray _16 swivel mount _17 decrease edema      _18 decrease subluxation   _19 control tone   _20 placement for AAC/Computer/EADL _21 decrease gravitational pull on shoulders _22 provide midline positioning _23 provide support to increase UE function _24 provide hand support in natural position _25 provide work surface   POWER WHEELCHAIR CONTROLS  _26 Proportional  _27 Non-Proportional Type                                      _28 Left  _29 Right _30 provides access for controlling wheelchair   _31 lacks motor control to operate proportional drive control <EGBTDVVOHYWVPXTG>_6<\/YIRSWNIOEVOJJKKX>_38 unable to understand proportional controls  Actuator Control Module  _33 Single  _34 Multiple   _35 Allow the client to operate the power seat function(s) through the joystick control   _36 Safety Reset Switches _37 Used to change modes and stop the wheelchair when driving in latch mode    _38 [employer]   _39 programming for accurate control _40 progressive Disease/changing condition _41 non-proportional drive control needed _42 Needed in order to operate power seat functions through joystick control   _43 Display box _44 Allows user to  see in which mode and drive the wheelchair is set  _45 necessary for alternate controls    _46 Digital interface electronics _47 Allows w/c to operate when using alternative drive controls  <HWEXHBZJIRCVELFY>_1<\/OFBPZWCHENIDPOEU>_23 ASL Head Array _49 Allows client to operate wheelchair  through switches placed in tri-panel headrest  _50 Sip and puff with tubing kit _51 needed to operate sip and puff drive controls  <NTIRWERXVQMGQQPY>_1<\/PJKDTOIZTIWPYKDX>_83 Upgraded tracking electronics _53 increase safety when driving <JASNKNLZJQBHALPF>_7<\/TKWIOXBDZHGDJMEQ>_68 correct tracking when on uneven surfaces  _55 [facility] for switches or joystick _56 Attaches switches to w/c  _57 Swing away for access or transfers _58 midline for optimal placement _59 provides for consistent access  _60 Attendant controlled joystick plus mount _61 safety _62 long distance driving <TMHDQQIWLNLGXQJJ>_9<\/ERDEYCXKGYJEHUDJ>_49 operation of seat functions _64 compliance with transportation regulations _65                                             Rear wheel placement/Axle adjustability _66 None _67 semi adjustable _68 fully adjustable  _69 improved UE access to wheels _70 improved stability _71 changing angle in space for improvement of postural stability _72 1-arm drive access <FWYOVZCHYIFOYDXA>_1<\/OINOMVEHMCNOBSJG>_28 amputee pad placement _74                                Wheel rims/ hand rims  _75 metal  _76 plastic coated _77 oblique projections _78 vertical projections _79 Provide ability to propel manual wheelchair  _80  Increase self-propulsion with hand weakness/decreased grasp  Push handles _81 extended  _82 angle adjustable  _83 standard _84 caregiver access _85 caregiver assist _86 allows "hooking" to enable increased ability to perform ADLs or maintain balance  One armed device  _87 Lt   _88 Rt _89 enable propulsion of manual wheelchair with one arm   _90   Brake/wheel lock extension _0  Lt   _1  Rt _2 increase indep in applying wheel locks   _3 Side guards _4 prevent clothing getting caught in wheel or becoming soiled _5  prevent skin tears/abrasions  Battery:                                            _6 to power wheelchair                                                          Other: mounting brackets                                   For safe transport with public transportation                                                  The above equipment has a life- long use expectancy. Growth and changes in medical and/or functional conditions would be the exceptions. This is to certify that the therapist has no financial relationship with durable medical provider or manufacturer. The therapist will not receive remuneration of any kind for the equipment recommended in this evaluation.   Patient has mobility limitation that significantly impairs safe, timely participation in one or more mobility related ADL's.  (bathing, toileting, feeding, dressing, grooming, moving from room to room)                                                             _7  Yes _8  No Will mobility device sufficiently improve ability to participate and/or be aided in participation of MRADL's?      _9  Yes _10  No Can limitation be compensated for with use of a cane or walker?                                                                                _11  Yes _12  No Does patient or caregiver demonstrate ability/potential ability & willingness to safely use the mobility device?    _13  Yes _14  No Does patient's home environment support use of recommended mobility device?                                                    _15  Yes _16  No Does patient have sufficient upper extremity function necessary to functionally propel a manual wheelchair?     _17   Yes _0  No Does patient have sufficient strength and trunk stability to safely operate a POV (scooter)?                                   _1  Yes _2  No Does patient need additional features/benefits provided by a power wheelchair for MRADL's in the home?        _3  Yes _4  No Does the patient demonstrate the ability to safely use a power wheelchair?                                                              _5  Yes _6  No     Physician's Name Printed:  Dr. Hester Mates                                                      Physician's Signature:  Date:     This is to certify that I, the above signed therapist have the following affiliations: _7  This DME provider _8  Manufacturer of recommended equipment _9  Patient's long term care facility _10  None of the above  Therapist Name/Signature:      Norwood Levo. Hopkins, PT, DPT                                      Date: 08/21/15                              PT Education - 08/21/15 1023    Education provided Yes   Education Details wheelchair recommendations   Person(s) Educated Patient   Methods Explanation   Comprehension Verbalized understanding             PT Long Term Goals - 08/07/15 1004    PT LONG TERM GOAL #1   Title Patient will be independent with HEP to improve flexibility and LE strength to improve independence within the home by 09/25/15   Time 12   Period Weeks   Status On-going   PT LONG TERM GOAL #2   Title Patient will be able to complete 5xsit to stand with use of UE in less 30seconds to indicate improved functional capacity at home by 09/25/15   Time 12   Period Weeks   Status Partially Met   PT LONG TERM GOAL #3   Title Patient will be able to ambulate 50 ft with RW to improve access to home environment by 09/25/15    Time 12   Period Weeks   Status Not Met   PT LONG TERM GOAL #4   Title Patient will improve muscle strength to at least 4+/5 to allow him to perform all transfer at modified independent level and be able to walk within home by 09/25/15   Time 12   Period Weeks   Status Partially Met               Plan - 08/21/15 1023  Clinical Impression Statement PT assessed patient for power wheelchair. please see therapy note.    Pt will benefit from skilled therapeutic intervention in order to improve on the following deficits Decreased activity tolerance;Decreased balance;Decreased  endurance;Decreased mobility;Decreased range of motion;Decreased safety awareness;Decreased strength;Difficulty walking;Hypomobility;Increased fascial restricitons;Impaired perceived functional ability;Impaired flexibility;Improper body mechanics;Postural dysfunction   Rehab Potential Fair   Clinical Impairments Affecting Rehab Potential Positive: Patient is motivated to improve, family support. Negative: chronic weakness, muscle contractures, comorbidities.    PT Frequency 2x / week   PT Duration 12 weeks   PT Treatment/Interventions ADLs/Self Care Home Management;Aquatic Therapy;Cryotherapy;Moist Heat;Ultrasound;DME Instruction;Gait training;Stair training;Functional mobility training;Therapeutic activities;Therapeutic exercise;Balance training;Neuromuscular re-education;Patient/family education;Wheelchair mobility training;Manual techniques;Passive range of motion;Dry needling;Energy conservation   PT Next Visit Plan sit to stand. lite gait.    PT Home Exercise Plan continue as given with increased band resistance.    Consulted and Agree with Plan of Care Patient        Problem List Patient Active Problem List   Diagnosis Date Noted  . Chronic diastolic heart failure ([place]) 08/11/2015  . Fluid overload 07/23/2015  . Generalized weakness 05/08/2015  . Other emphysema ([place]) 05/08/2015  . Tobacco use disorder 05/08/2015  . Hypokalemia 05/08/2015  . Pressure ulcer 05/05/2015  . Symptomatic anemia 05/04/2015  . CAFL (chronic airflow limitation) ([place]) 04/10/2015  . Malnutrition of moderate degree ([place]) 03/22/2015  . Pneumonia 03/20/2015  . History of sepsis 02/27/2015  . Incontinence 02/27/2015  . Persistent atrial fibrillation ([place]) 02/27/2015  . Preoperative cardiovascular examination 02/27/2015  . Chronic combined systolic and diastolic CHF (congestive heart failure) ([place])   . ESRD on hemodialysis ([place])   . Anemia   . History of small bowel obstruction   . Atrial fibrillation with  RVR ([place]) 01/21/2015  . Chronic kidney disease (CKD), stage IV (severe) ([place]) 03/01/2014  . Essential (primary) hypertension 03/01/2014  . 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 Cystitis 11/11/2012  . Hydronephrosis 08/27/2012  . Bladder cancer ([place]) 08/27/2012  . CA of prostate ([place]) 08/27/2012  . Bone metastases ([place]) 08/27/2012    Hopkins,Holdan Stucke PT, DPT 08/21/2015, 10:24 AM  [place] MAIN [facility] SERVICES [address] [place], [place], [address] Phone: [phone]   Fax:  [phone]  Name: CURRIE DENNIN MRN: [id number] Date of Birth: [birth year]

## 2015-08-23 ENCOUNTER — Encounter: Payer: Self-pay | Admitting: Physical Therapy

## 2015-08-23 ENCOUNTER — Ambulatory Visit: Payer: Medicare Other | Admitting: Physical Therapy

## 2015-08-23 DIAGNOSIS — R29898 Other symptoms and signs involving the musculoskeletal system: Secondary | ICD-10-CM

## 2015-08-23 DIAGNOSIS — Z7409 Other reduced mobility: Secondary | ICD-10-CM
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
## 2015-08-24 ENCOUNTER — Emergency Department
Admission: EM | Admit: 2015-08-24 | Discharge: 2015-08-24 | Disposition: A | Discharge status: Home / Self Care | Payer: Medicare Other | Attending: Emergency Medicine | Admitting: Emergency Medicine

## 2015-08-24 ENCOUNTER — Other Ambulatory Visit: Payer: Self-pay

## 2015-08-24 ENCOUNTER — Emergency Department: Payer: Medicare Other

## 2015-08-24 DIAGNOSIS — I120 Hypertensive chronic kidney disease with stage 5 chronic kidney disease or end stage renal disease: Secondary | ICD-10-CM | POA: Insufficient documentation

## 2015-08-24 DIAGNOSIS — Z880 Allergy status to penicillin: Secondary | ICD-10-CM | POA: Diagnosis not present

## 2015-08-24 DIAGNOSIS — Z792 Long term (current) use of antibiotics: Secondary | ICD-10-CM | POA: Diagnosis not present

## 2015-08-24 DIAGNOSIS — Z992 Dependence on renal dialysis: Secondary | ICD-10-CM | POA: Insufficient documentation

## 2015-08-24 DIAGNOSIS — J441 Chronic obstructive pulmonary disease with (acute) exacerbation: Secondary | ICD-10-CM | POA: Diagnosis not present

## 2015-08-24 DIAGNOSIS — Z7951 Long term (current) use of inhaled steroids: Secondary | ICD-10-CM | POA: Diagnosis not present

## 2015-08-24 DIAGNOSIS — R197 Diarrhea, unspecified: Secondary | ICD-10-CM | POA: Diagnosis not present

## 2015-08-24 DIAGNOSIS — N186 End stage renal disease: Secondary | ICD-10-CM | POA: Insufficient documentation

## 2015-08-24 DIAGNOSIS — Z79899 Other long term (current) drug therapy: Secondary | ICD-10-CM | POA: Diagnosis not present

## 2015-08-24 DIAGNOSIS — Z87891 Personal history of nicotine dependence: Secondary | ICD-10-CM | POA: Diagnosis not present

## 2015-08-24 DIAGNOSIS — R0602 Shortness of breath: Secondary | ICD-10-CM

## 2015-08-24 LAB — BASIC METABOLIC PANEL
Anion gap: 11 (ref 5–15)
BUN: 49 mg/dL — AB (ref 6–20)
CHLORIDE: 101 mmol/L (ref 101–111)
CO2: 30 mmol/L (ref 22–32)
Calcium: 8.9 mg/dL (ref 8.9–10.3)
Creatinine, Ser: 3.75 mg/dL — ABNORMAL HIGH (ref 0.61–1.24)
GFR calc Af Amer: 17 mL/min — ABNORMAL LOW (ref >60)
GFR calc non Af Amer: 15 mL/min — ABNORMAL LOW (ref >60)
GLUCOSE: 83 mg/dL (ref 65–99)
POTASSIUM: 3.8 mmol/L (ref 3.5–5.1)
SODIUM: 142 mmol/L (ref 135–145)

## 2015-08-24 LAB — CBC WITH DIFFERENTIAL/PLATELET
Basophils Absolute: 0 10*3/uL (ref 0–0.1)
Basophils Relative: 1 %
Eosinophils Absolute: 0.2 10*3/uL (ref 0–0.7)
Eosinophils Relative: 5 %
HEMATOCRIT: 26.1 % — AB (ref 40.0–52.0)
HEMOGLOBIN: 8.2 g/dL — AB (ref 13.0–18.0)
LYMPHS ABS: 1 10*3/uL (ref 1.0–3.6)
LYMPHS PCT: 22 %
MCH: 27.6 pg (ref 26.0–34.0)
MCHC: 31.4 g/dL — ABNORMAL LOW (ref 32.0–36.0)
MCV: 88.1 fL (ref 80.0–100.0)
Monocytes Absolute: 0.8 10*3/uL (ref 0.2–1.0)
Monocytes Relative: 17 %
NEUTROS ABS: 2.7 10*3/uL (ref 1.4–6.5)
NEUTROS PCT: 55 %
Platelets: 220 10*3/uL (ref 150–440)
RBC: 2.97 MIL/uL — AB (ref 4.40–5.90)
RDW: 17 % — ABNORMAL HIGH (ref 11.5–14.5)
WBC: 4.8 10*3/uL (ref 3.8–10.6)

## 2015-08-24 LAB — C DIFFICILE QUICK SCREEN W PCR REFLEX
C DIFFICLE (CDIFF) ANTIGEN: NEGATIVE
C Diff interpretation: NEGATIVE
C Diff toxin: NEGATIVE

## 2015-08-24 NOTE — ED Notes (Signed)
MD at bedside. 

## 2015-08-24 NOTE — ED Notes (Addendum)
Pt arrives from dialysis via EMS with c/o diarrhea. Pt known to have c-diff, unable to state when dx-possibly in the summer months per patient report. Pt states that the diarrhea is bothering him and that is why he requested transportation to hospital. Pt also reports increasing SOB. States that mild SOB is baseline, pt wears oxygen PRN at home-mainly at night. Pt did not receive dialysis treatment, schedule is TUE, TH, SAT; last treatment completed Tuesday. Pt denies pain. Pt alert and oriented X4, active, cooperative, pt in NAD. RR even and unlabored, color WNL.

## 2015-08-24 NOTE — ED Notes (Signed)
Pt changed, pericare performed

## 2015-08-24 NOTE — ED Notes (Signed)
X-ray at bedside

## 2015-08-24 NOTE — ED Notes (Signed)
[place], [place].

## 2015-08-24 NOTE — ED Notes (Addendum)
Pt had small BM. Scant amount of dark brown stool. Such small amount, unable to tell if thick or thin. Too small of amount to collect, absorbed into brief. Pt changed, peri care performed. PT now in bed watching TV.

## 2015-08-24 NOTE — Discharge Instructions (Signed)
Please seek medical attention for any high fevers, chest pain, shortness of breath, change in behavior, persistent vomiting, bloody stool or any other new or concerning symptoms.    Dialysis Dialysis is a procedure that replaces some of the work healthy kidneys do. It is done when you lose about 85-90% of your kidney function. It may also be done earlier if your symptoms may be improved by dialysis. During dialysis, wastes, salt, and extra water are removed from the blood, and the levels of certain chemicals in the blood (such as potassium) are maintained. Dialysis is done in sessions. Dialysis sessions are continued until the kidneys get better. If the kidneys cannot get better, such as in end-stage kidney disease, dialysis is continued for life or until you receive a new kidney (kidney transplant). There are two types of dialysis: hemodialysis and peritoneal dialysis. WHAT IS HEMODIALYSIS?  Hemodialysis is a type of dialysis in which a machine called a dialyzer is used to filter the blood. Before beginning hemodialysis, you will have surgery to create a site where blood can be removed from the body and returned to the body (vascular access). There are three types of vascular accesses:  Arteriovenous fistula. To create this type of access, an artery is connected to a vein (usually in the arm). A fistula takes 1-6 months to develop after surgery. If it develops properly, it usually lasts longer than the other types of vascular accesses. It is also less likely to become infected and cause blood clots.  Arteriovenous graft. To create this type of access, an artery and a vein in the arm are connected with a tube. A graft may be used within 2-3 weeks of surgery.  A venous catheter. To create this type of access, a thin, flexible tube (catheter) is placed in a large vein in your neck, chest, or groin. A catheter may be used right away. It is usually used as a temporary access when dialysis needs to begin  immediately. During hemodialysis, blood leaves the body through your access. It travels through a tube to the dialyzer, where it is filtered. The blood then returns to your body through another tube. Hemodialysis is usually performed by a health care provider at a hospital or dialysis center three times a week. Visits last about 3-4 hours. It may also be performed with the help of another person at home with training.  WHAT IS PERITONEAL DIALYSIS? Peritoneal dialysis is a type of dialysis in which the thin lining of the abdomen (peritoneum) is used as a filter. Before beginning peritoneal dialysis, you will have surgery to place a catheter in your abdomen. The catheter will be used to transfer a fluid called dialysate to and from your abdomen. At the start of a session, your abdomen is filled with dialysate. During the session, wastes, salt, and extra water in the blood pass through the peritoneum and into the dialysate. The dialysate is drained from the body at the end of the session. The process of filling and draining the dialysate is called an exchange. Exchanges are repeated until you have used up all the dialysate for the day. Peritoneal dialysis may be performed by you at home or at almost any other location. It is done every day. You may need up to five exchanges a day. The amount of time the dialysate is in your body between exchanges is called a dwell. The dwell depends on the number of exchanges needed and the characteristics of the peritoneum. It usually varies from  1.5-3 hours. You may go about your day normally between exchanges. Alternately, the exchanges may be done at night while you sleep, using a machine called a cycler. WHICH TYPE OF DIALYSIS SHOULD I CHOOSE?  Both hemodialysis and peritoneal dialysis have advantages and disadvantages. Talk to your health care provider about which type of dialysis would be best for you. Your lifestyle and preferences should be considered along with your  medical condition. In some cases, only one type of dialysis may be an option.  Advantages of hemodialysis  It is done less often than peritoneal dialysis.  Someone else can do the dialysis for you.  If you go to a dialysis center, your health care provider will be able to recognize any problems right away.  If you go to a dialysis center, you can interact with others who are having dialysis. This can provide you with emotional support. Disadvantages of hemodialysis  Hemodialysis may cause cramps and low blood pressure. It may leave you feeling tired on the days you have the treatment.  If you go to a dialysis center, you will need to make weekly appointments and work around the center's schedule.  You will need to take extra care when traveling. If you go to a dialysis center, you will need to make special arrangements to visit a dialysis center near your destination. If you are having treatments at home, you will need to take the dialyzer with you to your destination.  You will need to avoid more foods than you would need to avoid on peritoneal dialysis. Advantages of peritoneal dialysis  It is less likely than hemodialysis to cause cramps and low blood pressure.  You may do exchanges on your own wherever you are, including when you travel.  You do not need to avoid as many foods as you do on hemodialysis. Disadvantages of peritoneal dialysis  It is done more often than hemodialysis.  Performing peritoneal dialysis requires you to have dexterity of the hands. You must also be able to lift bags.  You will have to learn sterilization techniques. You will need to practice them every day to reduce the risk of infection. WHAT CHANGES WILL I NEED TO MAKE TO MY DIET DURING DIALYSIS? Both hemodialysis and peritoneal dialysis require you to make some changes to your diet. For example, you will need to limit your intake of foods high in the minerals phosphorus and potassium. You will also  need to limit your fluid intake. Your dietitian can help you plan meals. A good meal plan can improve your dialysis and your health.  WHAT SHOULD I EXPECT WHEN BEGINNING DIALYSIS? Adjusting to the dialysis treatment, schedule, and diet can take some time. You may need to stop working and may not be able to do some of the things you normally do. You may feel anxious or depressed when beginning dialysis. Eventually, many people feel better overall because of dialysis. Some people are able to return to work after making some changes, such as reducing work intensity. WHERE CAN I FIND MORE INFORMATION?   [place]: www.kidney.org  American Association of Kidney Patients: BombTimer.gl  [employer]: www.kidneyfund.org   This information is not intended to replace advice given to you by your health care provider. Make sure you discuss any questions you have with your health care provider.   Document Released: 11/30/2002 Document Revised: 09/30/2014 Document Reviewed: 11/03/2012 Elsevier Interactive Patient Education [employer].

## 2015-08-24 NOTE — Progress Notes (Signed)
[facility]  ROUNDING NOTE   Subjective:  Patient well known to [place] from the dialysis center. He was to have dialysis today however continues to have loose stools. In addition patient reports that he is quite weak. His health care power of attorney inquired as to whether he could be placed into a facility as he needs more support at home. 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 Chest x-ray reveals mild pulmonary edema. Patient did not receive dialysis in the dialysis center today.   Objective:  Vital signs in last 24 hours:  Temp:  [97.7 F (36.5 C)] 97.7 F (36.5 C) (12/01 0736) Pulse Rate:  [78-84] 84 (12/01 1100) Resp:  [21-22] 21 (12/01 1100) BP: (139-145)/(84-85) 139/84 mmHg (12/01 1100) SpO2:  [95 %] 95 % (12/01 1100) Weight:  [92.987 kg (205 lb)] 92.987 kg (205 lb) (12/01 0736)  Weight change:  Filed Weights   08/24/15 0736  Weight: 92.987 kg (205 lb)    Intake/Output:     Intake/Output this shift:     Physical Exam: General: NAD, resting in gurney  Head: Normocephalic, atraumatic. Moist oral mucosal membranes  Eyes: Anicteric, PERRL  Neck: Supple, trachea midline  Lungs:   bibasilar rales, normal effort   Heart: S1S2 no rubs  Abdomen:  Soft, nontender, bowel sounds present   Extremities:  trace peripheral edema.  Neurologic: Nonfocal, moving all four extremities  Skin: No lesions  Access: LUE AVF    Basic Metabolic Panel:  Recent Labs Lab 08/24/15 0740  NA 142  K 3.8  CL 101  CO2 30  GLUCOSE 83  BUN 49*  CREATININE 3.75*  CALCIUM 8.9    Liver Function Tests: No results for input(s): AST, ALT, ALKPHOS, BILITOT, PROT, ALBUMIN in the last 168 hours. No results for input(s): LIPASE, AMYLASE in the last 168 hours. No results for input(s): AMMONIA in the last 168 hours.  CBC:  Recent Labs Lab 08/24/15 0740  WBC 4.8  NEUTROABS 2.7  HGB 8.2*  HCT 26.1*  MCV 88.1  PLT 220    Cardiac Enzymes: No results  for input(s): CKTOTAL, CKMB, CKMBINDEX, TROPONINI in the last 168 hours.  BNP: Invalid input(s): POCBNP  CBG: No results for input(s): GLUCAP in the last 168 hours.  Microbiology: Results for orders placed or performed during the hospital encounter of 07/23/15  Blood culture (routine x 2)     Status: None   Collection Time: 07/23/15  8:57 AM  Result Value Ref Range Status   Specimen Description BLOOD RIGHT HAND  Final   Special Requests   Final    BOTTLES DRAWN AEROBIC AND ANAEROBIC  AER 3CC ANA 1CC   Culture NO GROWTH 5 DAYS  Final   Report Status 07/28/2015 FINAL  Final  Blood culture (routine x 2)     Status: None   Collection Time: 07/23/15  8:59 AM  Result Value Ref Range Status   Specimen Description BLOOD RIGHT ASSIST CONTROL  Final   Special Requests BOTTLES DRAWN AEROBIC AND ANAEROBIC  2CC  Final   Culture NO GROWTH 5 DAYS  Final   Report Status 07/28/2015 FINAL  Final  MRSA PCR Screening     Status: None   Collection Time: 07/23/15  1:45 PM  Result Value Ref Range Status   MRSA by PCR NEGATIVE NEGATIVE Final    Comment:        The GeneXpert MRSA Assay (FDA approved for NASAL specimens only), is one component of  a comprehensive MRSA colonization surveillance program. It is not intended to diagnose MRSA infection nor to guide or monitor treatment for MRSA infections.     Coagulation Studies: No results for input(s): LABPROT, INR in the last 72 hours.  Urinalysis: No results for input(s): COLORURINE, LABSPEC, PHURINE, GLUCOSEU, HGBUR, BILIRUBINUR, KETONESUR, PROTEINUR, UROBILINOGEN, NITRITE, LEUKOCYTESUR in the last 72 hours.  Invalid input(s): APPERANCEUR    Imaging: Dg Chest 1 View  08/24/2015  CLINICAL DATA:  Short of breath.  Dialysis patient EXAM: CHEST 1 VIEW COMPARISON:  07/23/2015 FINDINGS: Cardiac enlargement. Thoracic aortic aneurysm. Diffuse bilateral airspace disease unchanged most compatible with mild edema. Small bilateral effusions with  bibasilar atelectasis. IMPRESSION: Fluid overload with mild edema and small bilateral effusions. No change from the prior study. Electronically Signed   By: Franchot Gallo M.D.   On: 08/24/2015 09:12     Medications:       Assessment/ Plan:  74 y.o. male with end-stage renal disease [address] MWF, hx of small bowel obstruction in May 2016, GERD, COPD, bladder cancer, history of TURBT, anemia of CKD, secondary hyperparathyroidism, recurrent C. Diff colitis.   1. End-stage renal disease on hemodialysis Monday, Wednesday, Friday. The patient does appear to be in need of dialysis today as there was mild pulmonary edema on the chest x-ray. We will plan for dialysis with ultrafiltration target of 2.5 kg.  2. Anemia of chronic kidney disease. Hemoglobin currently low at 8.2. We will resume the patient on Epogen with dialysis.  3. Secondary hyperparathyroidism. We will check phosphorus as well as PTH with dialysis today.  4. Diarrhea. The patient has had recurrent diarrhea and has had bouts of C. difficile colitis in the past. C. difficile toxin to be sent today.  5. Generalized weakness. The patient has been receiving physical therapy on an outpatient basis. The patient's family reports that it has been difficult to take care of him. He may end up requiring placement in a facility.     LOS:  Kem Hensen 12/1/201611:23 AM

## 2015-08-24 NOTE — ED Notes (Signed)
Pt continues to await EMS transport.  

## 2015-08-24 NOTE — ED Notes (Signed)
Pt left with EMS 

## 2015-08-24 NOTE — ED Provider Notes (Signed)
[facility] Emergency Department Provider Note    ____________________________________________  Time seen: 0805  I have reviewed the triage vital signs and the nursing notes.   HISTORY  Chief Complaint Diarrhea and Shortness of Breath   History limited by: Poor historian   HPI [name] is a 74 y.o. male with history of end-stage renal disease, on dialysis, who presents to the emergency department today because of concerns for diarrhea. Patient states that he has had some diarrhea for the past couple of days. He states yesterday had 2-3 episodes of diarrhea. He states that he has a history of this. He is unsure when he was last treated for that although he thinks it might of been in the summertime. He denies any blood with his stool. Denies any abdominal pain with this diarrhea. He states that he has had some slightly increased shortness of breath as well. He has had to use his oxygen which he typically only uses syrup when necessary during the nighttime. He denies any fevers or chest pain.   Past Medical History  Diagnosis Date  . PAF (paroxysmal atrial fibrillation) ([place])     a. new onset 12/2014 in the setting of UTI, sepsis, hypotension, and anemia; b. not on long term anticoagulation given anemia; c. family aware of stroke risk, they are ok with this; d. on amiodarone   . Chronic combined systolic and diastolic CHF (congestive heart failure) ([place])     a. echo 12/2014: EF 25-30%, anterior wall wall motion abnormalities; b. planned ischemic evaluation once patient is stable medically; c. echo 01/2015: EF 45-50%, no RWMA, mild AT, LA mildly dilated, mod pericardial effusion along LV free wall, no evidence of hemodynamic compromise  . ESRD on hemodialysis ([place])     a. Tuesday, Thursday, and Saturdays  . Anemia     a. baseline hgb ~ 8  . History of small bowel obstruction     a. 01/2015  . History of septic shock     a. 01/2015  . GERD (gastroesophageal  reflux disease)   . Allergic rhinitis   . COPD (chronic obstructive pulmonary disease) ([place])   . Hypercholesteremia   . Cognitive communication deficit   . Magnesium deficiency   . Dementia   . 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 Incontinence   . Hydronephrosis   . Overactive bladder   . Detrusor sphincter dyssynergia   . Mini stroke ([place])   . Bladder cancer ([place])   . Prostate cancer ([place])   . Renal insufficiency   . Hypertension     Patient Active Problem List   Diagnosis Date Noted  . Chronic diastolic heart failure ([place]) 08/11/2015  . Fluid overload 07/23/2015  . Generalized weakness 05/08/2015  . Other emphysema ([place]) 05/08/2015  . Tobacco use disorder 05/08/2015  . Hypokalemia 05/08/2015  . Pressure ulcer 05/05/2015  . Symptomatic anemia 05/04/2015  . CAFL (chronic airflow limitation) ([place]) 04/10/2015  . Malnutrition of moderate degree ([place]) 03/22/2015  . Pneumonia 03/20/2015  . History of sepsis 02/27/2015  . Incontinence 02/27/2015  . Persistent atrial fibrillation ([place]) 02/27/2015  . Preoperative cardiovascular examination 02/27/2015  . Chronic combined systolic and diastolic CHF (congestive heart failure) ([place])   . ESRD on hemodialysis ([place])   . Anemia   . History of small bowel obstruction   . Atrial fibrillation with RVR ([place]) 01/21/2015  . Chronic kidney disease (CKD), stage IV (severe) ([place]) 03/01/2014  . Essential (primary) hypertension 03/01/2014  . 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 Cystitis 11/11/2012  . Hydronephrosis 08/27/2012  . Bladder cancer ([place]) 08/27/2012  . CA of prostate ([place]) 08/27/2012  . Bone metastases ([place]) 08/27/2012    Past Surgical History  Procedure Laterality Date  . Cystoscopy w/ ureteral stent placement    . Transurethral resection of bladder tumor with gyrus (turbt-gyrus)    . Ureteral stent placement    . Prostate surgery      Removal  . Av fistula placement      left arm    Current Outpatient Rx  Name  Route  Sig  Dispense   Refill  . albuterol (PROVENTIL HFA;VENTOLIN HFA) 108 (90 BASE) MCG/ACT inhaler   Inhalation   Inhale 2 puffs into the lungs every 4 (four) hours as needed for wheezing or shortness of breath.   1 Inhaler   3   . albuterol (PROVENTIL) (2.5 MG/3ML) 0.083% nebulizer solution   Nebulization   Take 3 mLs (2.5 mg total) by nebulization every 4 (four) hours as needed for wheezing or shortness of breath.   75 mL   12   . amiodarone (PACERONE) 200 MG tablet   Oral   Take 1 tablet (200 mg total) by mouth daily.   30 tablet   5   . amLODipine (NORVASC) 2.5 MG tablet   Oral   Take 2.5 mg by mouth daily.         Marland Kitchen atorvastatin (LIPITOR) 40 MG tablet   Oral   Take 1 tablet (40 mg total) by mouth daily.   30 tablet   1   . budesonide-formoterol (SYMBICORT) 160-4.5 MCG/ACT inhaler   Inhalation   Inhale 2 puffs into the lungs 2 (two) times daily.         . ferrous sulfate 325 (65 FE) MG tablet   Oral   Take 1 tablet (325 mg total) by mouth daily with breakfast.   90 tablet   0   . guaiFENesin (MUCINEX) 600 MG 12 hr tablet   Oral   Take 1 tablet (600 mg total) by mouth 2 (two) times daily.   20 tablet   2   . ipratropium-albuterol (DUONEB) 0.5-2.5 (3) MG/3ML SOLN   Nebulization   Take 3 mLs by nebulization every 4 (four) hours as needed.   360 mL   1   . levofloxacin (LEVAQUIN) 500 MG tablet   Oral   Take 1 tablet by mouth every other day.         . magnesium oxide (MAG-OX) 400 MG tablet   Oral   Take 400 mg by mouth every evening.          . Nutritional Supplements (FEEDING SUPPLEMENT, NEPRO CARB STEADY,) LIQD   Oral   Take 237 mLs by mouth 2 (two) times daily between meals. Patient not taking: Reported on 08/11/2015   30 Can   0   . polyethylene glycol (MIRALAX / GLYCOLAX) packet   Oral   Take 17 g by mouth daily.   14 each   0   . sevelamer carbonate (RENVELA) 800 MG tablet   Oral   Take 800 mg by mouth 3 (three) times daily before meals.           . tiotropium (SPIRIVA) 18 MCG inhalation capsule   Inhalation   Place 1 capsule (18 mcg total) into inhaler and inhale daily.   30 capsule   12     Allergies Penicillins  Family History  Problem Relation Age of  Onset  . Stroke Mother     Social History Social History  Substance Use Topics  . Smoking status: Former Smoker -- 0.25 packs/day for 70 years    Quit date: 05/13/2015  . Smokeless tobacco: Never Used  . Alcohol Use: No    Review of Systems  Constitutional: Negative for fever. Cardiovascular: Negative for chest pain. Respiratory: Positive for shortness of breath. Gastrointestinal: Negative for abdominal pain, vomiting and diarrhea. Neurological: Negative for headaches, focal weakness or numbness.  10-point ROS otherwise negative.  ____________________________________________   PHYSICAL EXAM:  VITAL SIGNS: ED Triage Vitals  Enc Vitals Group     BP --      Pulse Rate 08/24/15 0736 78     Resp 08/24/15 0736 22     Temp 08/24/15 0736 97.7 F (36.5 C)     Temp Source 08/24/15 0736 Oral     SpO2 08/24/15 0736 95 %     Weight 08/24/15 0736 205 lb (92.987 kg)     Height 08/24/15 0736 6\' 2"  (1.88 m)   Constitutional: Alert and oriented. Well appearing and in no distress. Eyes: Conjunctivae are normal. PERRL. Normal extraocular movements. ENT   Head: Normocephalic and atraumatic.   Nose: No congestion/rhinnorhea.   Mouth/Throat: Mucous membranes are moist.   Neck: No stridor. Hematological/Lymphatic/Immunilogical: No cervical lymphadenopathy. Cardiovascular: Normal rate, regular rhythm.  No murmurs, rubs, or gallops. Respiratory: Normal respiratory effort without tachypnea nor retractions. Breath sounds are clear and equal bilaterally. No wheezes/rales/rhonchi. Gastrointestinal: Soft and nontender. No distention.  Genitourinary: Deferred Musculoskeletal: Normal range of motion in all extremities. No joint effusions.  No lower extremity  tenderness nor edema. Neurologic:  Normal speech and language. No gross focal neurologic deficits are appreciated.  Skin:  Skin is warm, dry and intact. No rash noted. Psychiatric: Mood and affect are normal. Speech and behavior are normal. Patient exhibits appropriate insight and judgment.  ____________________________________________    LABS (pertinent positives/negatives)  Labs Reviewed  CBC WITH DIFFERENTIAL/PLATELET - Abnormal; Notable for the following:    RBC 2.97 (*)    Hemoglobin 8.2 (*)    HCT 26.1 (*)    MCHC 31.4 (*)    RDW 17.0 (*)    All other components within normal limits  BASIC METABOLIC PANEL - Abnormal; Notable for the following:    BUN 49 (*)    Creatinine, Ser 3.75 (*)    GFR calc non Af Amer 15 (*)    GFR calc Af Amer 17 (*)    All other components within normal limits  C DIFFICILE QUICK SCREEN W PCR REFLEX     ____________________________________________   EKG  I, Nance Pear, attending physician, personally viewed and interpreted this EKG  EKG Time: 0742 Rate: 76 Rhythm: normal sinus rhythm Axis: normal Intervals: qtc 493 QRS: narrow ST changes: no st elevation Impression: normal ekg ____________________________________________    RADIOLOGY  CXR IMPRESSION: Fluid overload with mild edema and small bilateral effusions. No change from the prior study.  ____________________________________________   PROCEDURES  Procedure(s) performed: None  Critical Care performed: No  ____________________________________________   INITIAL IMPRESSION / ASSESSMENT AND PLAN / ED COURSE  Pertinent labs & imaging results that were available during my care of the patient were reviewed by me and considered in my medical decision making (see chart for details).  Patient presented to the emergency department today because of concerns for diarrhea. C. difficile was negative. The patient did, just prior to receiving his dialysis treatment. Chest  x-ray  shows some mild edema however patient in no respiratory distress. We have contacted his dialysis center. Have been able to schedule him for dialysis treatment tomorrow. Will have patient follow-up with dialysis tomorrow.  ____________________________________________   FINAL CLINICAL IMPRESSION(S) / ED DIAGNOSES  Final diagnoses:  End stage renal disease ([place])  Shortness of breath     Nance Pear, MD 08/25/15 1340

## 2015-08-24 NOTE — ED Notes (Signed)
[name] states that they will dialyze patient. Will send patient back by EMS

## 2015-08-24 NOTE — ED Notes (Addendum)
Pt has had 2 large watery BM. Pericare performed. Pants changed. PT continues to await EMS transport. [facility] staff called and has rescheduled patient for dialysis tomorrow at 11:20AM. Pt aware. Staff state that they will arrange transport for patient.

## 2015-08-28 ENCOUNTER — Ambulatory Visit: Payer: Medicare Other | Admitting: Physical Therapy

## 2015-08-30 ENCOUNTER — Ambulatory Visit: Payer: Medicare Other | Admitting: Physical Therapy

## 2015-09-04 ENCOUNTER — Encounter: Payer: Self-pay | Admitting: Physical Therapy

## 2015-09-04 ENCOUNTER — Ambulatory Visit: Payer: Medicare Other | Attending: Family Medicine | Admitting: Physical Therapy

## 2015-09-04 DIAGNOSIS — Z7409 Other reduced mobility: Secondary | ICD-10-CM | POA: Insufficient documentation

## 2015-09-04 DIAGNOSIS — R29898 Other symptoms and signs involving the musculoskeletal system: Secondary | ICD-10-CM | POA: Diagnosis present
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
## 2015-09-06 ENCOUNTER — Encounter: Payer: Self-pay | Admitting: Physical Therapy

## 2015-09-06 ENCOUNTER — Ambulatory Visit: Payer: Medicare Other | Admitting: Physical Therapy

## 2015-09-06 DIAGNOSIS — R29898 Other symptoms and signs involving the musculoskeletal system: Secondary | ICD-10-CM

## 2015-09-06 DIAGNOSIS — Z7409 Other reduced mobility: Secondary | ICD-10-CM
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
## 2015-09-11 ENCOUNTER — Emergency Department: Payer: Medicare Other

## 2015-09-11 ENCOUNTER — Inpatient Hospital Stay
Admission: EM | Admit: 2015-09-11 | Discharge: 2015-09-13 | DRG: 640 | Disposition: A | Discharge status: Skilled Nursing Facility | Payer: Medicare Other | Attending: Internal Medicine | Admitting: Internal Medicine

## 2015-09-11 ENCOUNTER — Ambulatory Visit: Payer: Medicare Other | Admitting: Physical Therapy

## 2015-09-11 DIAGNOSIS — Z9981 Dependence on supplemental oxygen: Secondary | ICD-10-CM

## 2015-09-11 DIAGNOSIS — K219 Gastro-esophageal reflux disease without esophagitis: Secondary | ICD-10-CM | POA: Diagnosis present

## 2015-09-11 DIAGNOSIS — N186 End stage renal disease: Secondary | ICD-10-CM | POA: Diagnosis present

## 2015-09-11 DIAGNOSIS — Z992 Dependence on renal dialysis: Secondary | ICD-10-CM | POA: Diagnosis not present

## 2015-09-11 DIAGNOSIS — N2581 Secondary hyperparathyroidism of renal origin: Secondary | ICD-10-CM | POA: Diagnosis present

## 2015-09-11 DIAGNOSIS — E8770 Fluid overload, unspecified: Principal | ICD-10-CM | POA: Diagnosis present

## 2015-09-11 DIAGNOSIS — Z8551 Personal history of malignant neoplasm of bladder: Secondary | ICD-10-CM

## 2015-09-11 DIAGNOSIS — I5042 Chronic combined systolic (congestive) and diastolic (congestive) heart failure: Secondary | ICD-10-CM | POA: Diagnosis present

## 2015-09-11 DIAGNOSIS — I132 Hypertensive heart and chronic kidney disease with heart failure and with stage 5 chronic kidney disease, or end stage renal disease: Secondary | ICD-10-CM | POA: Diagnosis present

## 2015-09-11 DIAGNOSIS — Z880 Allergy status to penicillin: Secondary | ICD-10-CM

## 2015-09-11 DIAGNOSIS — R197 Diarrhea, unspecified: Secondary | ICD-10-CM | POA: Diagnosis present

## 2015-09-11 DIAGNOSIS — N3281 Overactive bladder: Secondary | ICD-10-CM | POA: Diagnosis present

## 2015-09-11 DIAGNOSIS — Z79899 Other long term (current) drug therapy: Secondary | ICD-10-CM

## 2015-09-11 DIAGNOSIS — Z8546 Personal history of malignant neoplasm of prostate: Secondary | ICD-10-CM | POA: Diagnosis not present

## 2015-09-11 DIAGNOSIS — C7951 Secondary malignant neoplasm of bone: Secondary | ICD-10-CM | POA: Diagnosis present

## 2015-09-11 DIAGNOSIS — J9601 Acute respiratory failure with hypoxia: Secondary | ICD-10-CM | POA: Diagnosis present

## 2015-09-11 DIAGNOSIS — D631 Anemia in chronic kidney disease: Secondary | ICD-10-CM | POA: Diagnosis present

## 2015-09-11 DIAGNOSIS — I480 Paroxysmal atrial fibrillation: Secondary | ICD-10-CM | POA: Diagnosis present

## 2015-09-11 DIAGNOSIS — J9600 Acute respiratory failure, unspecified whether with hypoxia or hypercapnia: Secondary | ICD-10-CM | POA: Diagnosis present

## 2015-09-11 DIAGNOSIS — Z9115 Patient's noncompliance with renal dialysis: Secondary | ICD-10-CM | POA: Diagnosis not present

## 2015-09-11 DIAGNOSIS — Z8673 Personal history of transient ischemic attack (TIA), and cerebral infarction without residual deficits: Secondary | ICD-10-CM | POA: Diagnosis not present

## 2015-09-11 DIAGNOSIS — J449 Chronic obstructive pulmonary disease, unspecified: Secondary | ICD-10-CM | POA: Diagnosis present

## 2015-09-11 LAB — TROPONIN I
TROPONIN I: 0.04 ng/mL — AB (ref <0.031)
Troponin I: 0.04 ng/mL — ABNORMAL HIGH (ref <0.031)
Troponin I: 0.04 ng/mL — ABNORMAL HIGH (ref <0.031)
Troponin I: 0.04 ng/mL — ABNORMAL HIGH (ref <0.031)

## 2015-09-11 LAB — CREATININE, SERUM
Creatinine, Ser: 3.83 mg/dL — ABNORMAL HIGH (ref 0.61–1.24)
GFR calc non Af Amer: 14 mL/min — ABNORMAL LOW (ref >60)
GFR, EST AFRICAN AMERICAN: 17 mL/min — AB (ref >60)

## 2015-09-11 LAB — CBC
HCT: 24.8 % — ABNORMAL LOW (ref 40.0–52.0)
HEMATOCRIT: 23.7 % — AB (ref 40.0–52.0)
Hemoglobin: 7.6 g/dL — ABNORMAL LOW (ref 13.0–18.0)
Hemoglobin: 7.2 g/dL — ABNORMAL LOW (ref 13.0–18.0)
MCH: 27 pg (ref 26.0–34.0)
MCH: 26.6 pg (ref 26.0–34.0)
MCHC: 30.5 g/dL — AB (ref 32.0–36.0)
MCHC: 30.3 g/dL — ABNORMAL LOW (ref 32.0–36.0)
MCV: 88.5 fL (ref 80.0–100.0)
MCV: 87.7 fL (ref 80.0–100.0)
PLATELETS: 220 10*3/uL (ref 150–440)
PLATELETS: 208 10*3/uL (ref 150–440)
RBC: 2.8 MIL/uL — AB (ref 4.40–5.90)
RBC: 2.71 MIL/uL — AB (ref 4.40–5.90)
RDW: 17.5 % — ABNORMAL HIGH (ref 11.5–14.5)
RDW: 17.7 % — ABNORMAL HIGH (ref 11.5–14.5)
WBC: 5.4 10*3/uL (ref 3.8–10.6)
WBC: 6.3 10*3/uL (ref 3.8–10.6)

## 2015-09-11 LAB — COMPREHENSIVE METABOLIC PANEL
ALK PHOS: 76 U/L (ref 38–126)
ALT: 8 U/L — AB (ref 17–63)
AST: 14 U/L — ABNORMAL LOW (ref 15–41)
Albumin: 2.9 g/dL — ABNORMAL LOW (ref 3.5–5.0)
Anion gap: 10 (ref 5–15)
BILIRUBIN TOTAL: 0.7 mg/dL (ref 0.3–1.2)
BUN: 59 mg/dL — ABNORMAL HIGH (ref 6–20)
CALCIUM: 8.6 mg/dL — AB (ref 8.9–10.3)
CO2: 27 mmol/L (ref 22–32)
CREATININE: 4.1 mg/dL — AB (ref 0.61–1.24)
Chloride: 105 mmol/L (ref 101–111)
GFR calc non Af Amer: 13 mL/min — ABNORMAL LOW (ref >60)
GFR, EST AFRICAN AMERICAN: 15 mL/min — AB (ref >60)
GLUCOSE: 152 mg/dL — AB (ref 65–99)
Potassium: 4.1 mmol/L (ref 3.5–5.1)
SODIUM: 142 mmol/L (ref 135–145)
TOTAL PROTEIN: 6.4 g/dL — AB (ref 6.5–8.1)

## 2015-09-11 LAB — MRSA PCR SCREENING: MRSA BY PCR: NEGATIVE

## 2015-09-11 MED ORDER — SODIUM CHLORIDE 0.9 % IV SOLN: 250.0000 mL | INTRAVENOUS | Status: DC | PRN

## 2015-09-11 MED ORDER — ONDANSETRON HCL 4 MG PO TABS: 4.0000 mg | ORAL_TABLET | Freq: Four times a day (QID) | ORAL | Status: DC | PRN

## 2015-09-11 MED ORDER — ACETAMINOPHEN 325 MG PO TABS: 650.0000 mg | ORAL_TABLET | Freq: Four times a day (QID) | ORAL | Status: DC | PRN

## 2015-09-11 MED ORDER — IPRATROPIUM-ALBUTEROL 0.5-2.5 (3) MG/3ML IN SOLN: 3.0000 mL | RESPIRATORY_TRACT | Status: DC | PRN

## 2015-09-11 MED ORDER — ATORVASTATIN CALCIUM 20 MG PO TABS
40.0000 mg | ORAL_TABLET | Freq: Every day | ORAL | Status: DC
Start: 2015-09-11 — End: 2015-09-13
  Administered 2015-09-11 – 2015-09-13 (×3): 40 mg via ORAL
  Filled 2015-09-11 (×3): qty 2

## 2015-09-11 MED ORDER — POLYETHYLENE GLYCOL 3350 17 G PO PACK
17.0000 g | PACK | Freq: Every day | ORAL | Status: DC
  Administered 2015-09-11: 17 g via ORAL
  Filled 2015-09-11: qty 1

## 2015-09-11 MED ORDER — HEPARIN SODIUM (PORCINE) 5000 UNIT/ML IJ SOLN
5000.0000 [IU] | Freq: Three times a day (TID) | INTRAMUSCULAR | Status: DC
  Administered 2015-09-11 – 2015-09-13 (×7): 5000 [IU] via SUBCUTANEOUS
  Filled 2015-09-11 (×7): qty 1

## 2015-09-11 MED ORDER — SEVELAMER CARBONATE 800 MG PO TABS
800.0000 mg | ORAL_TABLET | Freq: Three times a day (TID) | ORAL | Status: DC
  Administered 2015-09-11 – 2015-09-13 (×6): 800 mg via ORAL
  Filled 2015-09-11 (×6): qty 1

## 2015-09-11 MED ORDER — AMLODIPINE BESYLATE 5 MG PO TABS
2.5000 mg | ORAL_TABLET | Freq: Every day | ORAL | Status: DC
  Administered 2015-09-11 – 2015-09-13 (×3): 2.5 mg via ORAL
  Filled 2015-09-11 (×3): qty 1

## 2015-09-11 MED ORDER — GUAIFENESIN ER 600 MG PO TB12
600.0000 mg | ORAL_TABLET | Freq: Two times a day (BID) | ORAL | Status: DC
  Administered 2015-09-11 – 2015-09-13 (×5): 600 mg via ORAL
  Filled 2015-09-11 (×5): qty 1

## 2015-09-11 MED ORDER — NITROGLYCERIN 2 % TD OINT
1.0000 [in_us] | TOPICAL_OINTMENT | Freq: Once | TRANSDERMAL | Status: AC
  Administered 2015-09-11: 1 [in_us] via TOPICAL

## 2015-09-11 MED ORDER — TIOTROPIUM BROMIDE MONOHYDRATE 18 MCG IN CAPS
18.0000 ug | ORAL_CAPSULE | Freq: Every day | RESPIRATORY_TRACT | Status: DC
  Administered 2015-09-11 – 2015-09-13 (×3): 18 ug via RESPIRATORY_TRACT
  Filled 2015-09-11: qty 5

## 2015-09-11 MED ORDER — ONDANSETRON HCL 4 MG/2ML IJ SOLN: 4.0000 mg | Freq: Four times a day (QID) | INTRAMUSCULAR | Status: DC | PRN

## 2015-09-11 MED ORDER — SODIUM CHLORIDE 0.9 % IJ SOLN
3.0000 mL | Freq: Two times a day (BID) | INTRAMUSCULAR | Status: DC
  Administered 2015-09-11: 3 mL via INTRAVENOUS

## 2015-09-11 MED ORDER — EPOETIN ALFA 10000 UNIT/ML IJ SOLN
10000.0000 [IU] | INTRAMUSCULAR | Status: DC
  Administered 2015-09-12: 10000 [IU] via INTRAVENOUS
  Filled 2015-09-11 (×2): qty 1

## 2015-09-11 MED ORDER — BUDESONIDE-FORMOTEROL FUMARATE 160-4.5 MCG/ACT IN AERO
2.0000 | INHALATION_SPRAY | Freq: Two times a day (BID) | RESPIRATORY_TRACT | Status: DC
  Administered 2015-09-11 – 2015-09-13 (×4): 2 via RESPIRATORY_TRACT
  Filled 2015-09-11: qty 6

## 2015-09-11 MED ORDER — AMIODARONE HCL 200 MG PO TABS
200.0000 mg | ORAL_TABLET | Freq: Every day | ORAL | Status: DC
  Administered 2015-09-11 – 2015-09-13 (×3): 200 mg via ORAL
  Filled 2015-09-11 (×3): qty 1

## 2015-09-11 MED ORDER — ACETAMINOPHEN 650 MG RE SUPP: 650.0000 mg | Freq: Four times a day (QID) | RECTAL | Status: DC | PRN

## 2015-09-11 MED ORDER — NITROGLYCERIN 2 % TD OINT
TOPICAL_OINTMENT | TRANSDERMAL | Status: AC
  Filled 2015-09-11: qty 1

## 2015-09-11 MED ORDER — FUROSEMIDE 10 MG/ML IJ SOLN
80.0000 mg | Freq: Once | INTRAMUSCULAR | Status: AC
  Administered 2015-09-11: 80 mg via INTRAVENOUS

## 2015-09-11 MED ORDER — SODIUM CHLORIDE 0.9 % IJ SOLN
3.0000 mL | Freq: Two times a day (BID) | INTRAMUSCULAR | Status: DC
  Administered 2015-09-11 – 2015-09-12 (×4): 3 mL via INTRAVENOUS

## 2015-09-11 MED ORDER — MAGNESIUM OXIDE 400 (241.3 MG) MG PO TABS
400.0000 mg | ORAL_TABLET | Freq: Every evening | ORAL | Status: DC
  Administered 2015-09-11: 400 mg via ORAL
  Filled 2015-09-11: qty 1

## 2015-09-11 MED ORDER — SODIUM CHLORIDE 0.9 % IJ SOLN
3.0000 mL | INTRAMUSCULAR | Status: DC | PRN
  Administered 2015-09-11: 3 mL via INTRAVENOUS
  Filled 2015-09-11: qty 10

## 2015-09-11 MED ORDER — FERROUS SULFATE 325 (65 FE) MG PO TABS
325.0000 mg | ORAL_TABLET | Freq: Every day | ORAL | Status: DC
  Administered 2015-09-12 – 2015-09-13 (×2): 325 mg via ORAL
  Filled 2015-09-11 (×2): qty 1

## 2015-09-11 MED ORDER — NEPRO/CARBSTEADY PO LIQD
237.0000 mL | Freq: Two times a day (BID) | ORAL | Status: DC
  Administered 2015-09-12 – 2015-09-13 (×3): 237 mL via ORAL

## 2015-09-11 NOTE — ED Provider Notes (Signed)
[facility] Emergency Department Provider Note  ____________________________________________  Time seen: 6:05 AM on arrival by EMS  I have reviewed the triage vital signs and the nursing notes.   HISTORY  Chief Complaint Respiratory Distress    HPI [name] is a 74 y.o. male brought to ED for shortness of breath. There are called for shortness of breath and found the patient in respiratory distress. On initial exam for them they referred diffuse crackles and diminished air entry bilaterally. The patient was diaphoretic with severely increased work of breathing. They're unable to obtain a room air oxygen saturation. They placed the patient on CPAP and his breathing and aeration on lung auscultation improved. His oxygen saturation on the CPAP with 100% FiO2 was 96% pre-arrival. Patient denies chest pain. He is on dialysis and missed his most recent appointment on Saturday which is 2 days ago now.     Past Medical History  Diagnosis Date  . PAF (paroxysmal atrial fibrillation) ([place])     a. new onset 12/2014 in the setting of UTI, sepsis, hypotension, and anemia; b. not on long term anticoagulation given anemia; c. family aware of stroke risk, they are ok with this; d. on amiodarone   . Chronic combined systolic and diastolic CHF (congestive heart failure) ([place])     a. echo 12/2014: EF 25-30%, anterior wall wall motion abnormalities; b. planned ischemic evaluation once patient is stable medically; c. echo 01/2015: EF 45-50%, no RWMA, mild AT, LA mildly dilated, mod pericardial effusion along LV free wall, no evidence of hemodynamic compromise  . ESRD on hemodialysis ([place])     a. Tuesday, Thursday, and Saturdays  . Anemia     a. baseline hgb ~ 8  . History of small bowel obstruction     a. 01/2015  . History of septic shock     a. 01/2015  . GERD (gastroesophageal reflux disease)   . Allergic rhinitis   . COPD (chronic obstructive pulmonary disease) ([place])    . Hypercholesteremia   . Cognitive communication deficit   . Magnesium deficiency   . Dementia   . 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 Incontinence   . Hydronephrosis   . Overactive bladder   . Detrusor sphincter dyssynergia   . Mini stroke ([place])   . Bladder cancer ([place])   . Prostate cancer ([place])   . Renal insufficiency   . Hypertension      Patient Active Problem List   Diagnosis Date Noted  . Chronic diastolic heart failure ([place]) 08/11/2015  . Fluid overload 07/23/2015  . Generalized weakness 05/08/2015  . Other emphysema ([place]) 05/08/2015  . Tobacco use disorder 05/08/2015  . Hypokalemia 05/08/2015  . Pressure ulcer 05/05/2015  . Symptomatic anemia 05/04/2015  . CAFL (chronic airflow limitation) ([place]) 04/10/2015  . Malnutrition of moderate degree ([place]) 03/22/2015  . Pneumonia 03/20/2015  . History of sepsis 02/27/2015  . Incontinence 02/27/2015  . Persistent atrial fibrillation ([place]) 02/27/2015  . Preoperative cardiovascular examination 02/27/2015  . Chronic combined systolic and diastolic CHF (congestive heart failure) ([place])   . ESRD on hemodialysis ([place])   . Anemia   . History of small bowel obstruction   . Atrial fibrillation with RVR ([place]) 01/21/2015  . Chronic kidney disease (CKD), stage IV (severe) ([place]) 03/01/2014  . Essential (primary) hypertension 03/01/2014  . 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 Cystitis 11/11/2012  . Hydronephrosis 08/27/2012  . Bladder cancer ([place]) 08/27/2012  . CA of prostate ([place]) 08/27/2012  .  Bone metastases ([place]) 08/27/2012     Past Surgical History  Procedure Laterality Date  . Cystoscopy w/ ureteral stent placement    . Transurethral resection of bladder tumor with gyrus (turbt-gyrus)    . Ureteral stent placement    . Prostate surgery      Removal  . Av fistula placement      left arm     Current Outpatient Rx  Name  Route  Sig  Dispense  Refill  . albuterol (PROVENTIL HFA;VENTOLIN HFA) 108 (90 BASE) MCG/ACT inhaler    Inhalation   Inhale 2 puffs into the lungs every 4 (four) hours as needed for wheezing or shortness of breath.   1 Inhaler   3   . albuterol (PROVENTIL) (2.5 MG/3ML) 0.083% nebulizer solution   Nebulization   Take 3 mLs (2.5 mg total) by nebulization every 4 (four) hours as needed for wheezing or shortness of breath.   75 mL   12   . amiodarone (PACERONE) 200 MG tablet   Oral   Take 1 tablet (200 mg total) by mouth daily.   30 tablet   5   . amLODipine (NORVASC) 2.5 MG tablet   Oral   Take 2.5 mg by mouth daily.         Marland Kitchen atorvastatin (LIPITOR) 40 MG tablet   Oral   Take 1 tablet (40 mg total) by mouth daily.   30 tablet   1   . budesonide-formoterol (SYMBICORT) 160-4.5 MCG/ACT inhaler   Inhalation   Inhale 2 puffs into the lungs 2 (two) times daily.         . ferrous sulfate 325 (65 FE) MG tablet   Oral   Take 1 tablet (325 mg total) by mouth daily with breakfast.   90 tablet   0   . guaiFENesin (MUCINEX) 600 MG 12 hr tablet   Oral   Take 1 tablet (600 mg total) by mouth 2 (two) times daily.   20 tablet   2   . ipratropium-albuterol (DUONEB) 0.5-2.5 (3) MG/3ML SOLN   Nebulization   Take 3 mLs by nebulization every 4 (four) hours as needed.   360 mL   1   . magnesium oxide (MAG-OX) 400 MG tablet   Oral   Take 400 mg by mouth every evening.          . Nutritional Supplements (FEEDING SUPPLEMENT, NEPRO CARB STEADY,) LIQD   Oral   Take 237 mLs by mouth 2 (two) times daily between meals. Patient not taking: Reported on 08/11/2015   30 Can   0   . polyethylene glycol (MIRALAX / GLYCOLAX) packet   Oral   Take 17 g by mouth daily.   14 each   0   . sevelamer carbonate (RENVELA) 800 MG tablet   Oral   Take 800 mg by mouth 3 (three) times daily before meals.          . tiotropium (SPIRIVA) 18 MCG inhalation capsule   Inhalation   Place 1 capsule (18 mcg total) into inhaler and inhale daily.   30 capsule   12       Allergies Penicillins   Family History  Problem Relation Age of Onset  . Stroke Mother     Social History Social History  Substance Use Topics  . Smoking status: Former Smoker -- 0.25 packs/day for 58 years    Quit date: 05/13/2015  . Smokeless tobacco: Never Used  . Alcohol Use: No  Review of Systems  Constitutional:   No fever or chills. No weight changes Eyes:   No blurry vision or double vision.  ENT:   No sore throat. Cardiovascular:   No chest pain. Respiratory:   Positive shortness of breath. Gastrointestinal:   Negative for abdominal pain, vomiting and diarrhea.  No BRBPR or melena. Genitourinary:   Negative for dysuria, urinary retention, bloody urine, or difficulty urinating. Musculoskeletal:   Negative for back pain. No joint swelling or pain. Skin:   Negative for rash. Neurological:   Negative for headaches, focal weakness or numbness. Psychiatric:  No anxiety or depression.   Endocrine:  No hot/cold intolerance, changes in energy, or sleep difficulty.  10-point ROS otherwise negative.  ____________________________________________   PHYSICAL EXAM:  VITAL SIGNS: ED Triage Vitals  Enc Vitals Group     BP 09/11/15 0609 152/90 mmHg     Pulse Rate 09/11/15 0609 89     Resp 09/11/15 0609 30     Temp 09/11/15 0609 97.5 F (36.4 C)     Temp Source 09/11/15 0609 Axillary     SpO2 09/11/15 0609 100 %     Weight 09/11/15 0609 207 lb 0.2 oz (93.9 kg)     Height --      Head Cir --      Peak Flow --      Pain Score --      Pain Loc --      Pain Edu? --      Excl. in [place]? --     Vital signs reviewed, nursing assessments reviewed.   Constitutional:   Alert and oriented. Well appearing and in no distress. Eyes:   No scleral icterus. No conjunctival pallor. PERRL. EOMI ENT   Head:   Normocephalic and atraumatic.   Nose:   No congestion/rhinnorhea. No septal hematoma   Mouth/Throat:   MMM, no pharyngeal erythema. No peritonsillar mass.  No uvula shift.   Neck:   No stridor. No SubQ emphysema. No meningismus. Positive JVD Hematological/Lymphatic/Immunilogical:   No cervical lymphadenopathy. Cardiovascular:   RRR. Normal and symmetric distal pulses are present in all extremities. No murmurs, rubs, or gallops. Respiratory:   Increased work of breathing with accessory muscle use. Room air oxygen saturation when switching from EMS CPAP to our BiPAP was 75%. Diminished air entry bilateral bases, rales throughout bilateral lungs. Good peripheral pulses in all extremities. AV fistula in the upper left arm has a good pulse and strong thrill. Gastrointestinal:   Soft and nontender. No distention. There is no CVA tenderness.  No rebound, rigidity, or guarding. Genitourinary:   Stage II sacral decub throughout the region of the gluteal cleft and ischial spines Musculoskeletal:   Nontender with normal range of motion in all extremities. No joint effusions.  No lower extremity tenderness.  No edema. Neurologic:   Normal speech and language.  CN 2-10 normal. Motor grossly intact. No pronator drift.  Normal gait. No gross focal neurologic deficits are appreciated.  Skin:    Skin is warm, dry and intact. No rash noted.  No petechiae, purpura, or bullae. Psychiatric:   Mood and affect are normal. Speech and behavior are normal. Patient exhibits appropriate insight and judgment.  ____________________________________________    LABS (pertinent positives/negatives) (all labs ordered are listed, but only abnormal results are displayed) Labs Reviewed  TROPONIN I  COMPREHENSIVE METABOLIC PANEL  CBC  BLOOD GAS, VENOUS   ____________________________________________   EKG  Interpreted by me Sinus rhythm rate of 88, normal  axis, normal intervals. Normal QRS ST segments and T waves. There are 2 PVCs on the strip.  ____________________________________________    RADIOLOGY  Chest x-ray interpreted by me shows vascular congestion. Mild  pulmonary edema.  ____________________________________________   PROCEDURES CRITICAL CARE Performed by: Joni Fears, Khilee Hendricksen   Total critical care time: 35 minutes  Critical care time was exclusive of separately billable procedures and treating other patients.  Critical care was necessary to treat or prevent imminent or life-threatening deterioration.  Critical care was time spent personally by me on the following activities: development of treatment plan with patient and/or surrogate as well as nursing, discussions with consultants, evaluation of patient's response to treatment, examination of patient, obtaining history from patient or surrogate, ordering and performing treatments and interventions, ordering and review of laboratory studies, ordering and review of radiographic studies, pulse oximetry and re-evaluation of patient's condition.   Peripheral IV insertion by me under ultrasound visualization performed at 6:15 AM. Indication is due to immediate need for IV access, failed attempts by nursing, no suitable veins palpable on exam He has real-time visualization, 20-gauge in the right upper arm, 1 attempt. Good flush and blood draw. Complications. ____________________________________________   INITIAL IMPRESSION / ASSESSMENT AND PLAN / ED COURSE  Pertinent labs & imaging results that were available during my care of the patient were reviewed by me and considered in my medical decision making (see chart for details).  Patient presents in respiratory distress with hypoxia likely volume overload and pulmonary edema. We'll do chest x-ray and labs. Nitroglycerin paste. We'll try some IV Lasix in case it provides any benefit although the patient reports he is an uric. Low suspicion for pneumonia or sepsis, no evidence of ischemia on EKG. We'll continue BiPAP in the emergency department while waiting for labs and plan for admission.  ----------------------------------------- 7:08 AM on  09/11/2015 -----------------------------------------  Remains tachypneic with increased work of breathing but oxygen saturation is good on BiPAP. Continue to follow up labs. Case discussed with the hospitalist.   ____________________________________________   FINAL CLINICAL IMPRESSION(S) / ED DIAGNOSES  Final diagnoses:  Acute respiratory failure with hypoxia [facility])      Carrie Mew, MD 09/11/15 [phone]

## 2015-09-11 NOTE — ED Notes (Signed)
Peri care performed and barrier cream applied.

## 2015-09-11 NOTE — Progress Notes (Signed)
Patient is currently being followed in the [facility] and has an appointment scheduled on September 22, 2015 at 11:00am. Thank you

## 2015-09-11 NOTE — ED Notes (Addendum)
Dr Tressia Miners informed of pt troponin 0.04 and strip of wide complex QRS

## 2015-09-11 NOTE — ED Notes (Signed)
Pt with left upper arm dialysis shunt, with bruit and thrill. Pt states missed dialysis on Saturday of this week. No abd swelling noted.

## 2015-09-11 NOTE — Discharge Instructions (Signed)
[facility] Failure Clinic appointment on September 22, 2015 at 11:00am with Darylene Price, [place]. Please call [phone] to reschedule.

## 2015-09-11 NOTE — Care Management (Signed)
Patient presents from home and has chronic home 02 through [facility]. He denies having any services in the home.  He missed his dialysis treatment on Saturday because he was not able to "clean himself up from a diarrhea stool before the van left him."  Patient is complaining of weakness to the point that he feel he may need skilled nursing placement.  There is a physical therapy consult present.  Clinical social work is aware of the potential need for skilled nursing.

## 2015-09-11 NOTE — Progress Notes (Signed)
Pre-hd tx 

## 2015-09-11 NOTE — Clinical Social Work Note (Signed)
Patient was admitted and is requesting SNF. PT assessment is pending. CSW will follow up with patient and complete full assessment as able. [place], [place] [phone]

## 2015-09-11 NOTE — Progress Notes (Signed)
HD tx start 

## 2015-09-11 NOTE — Progress Notes (Signed)
Post hd tx 

## 2015-09-11 NOTE — Progress Notes (Signed)
PT Cancellation Note  Patient Details Name: [name] MRN: [id number] DOB: 1940-12-20   Cancelled Treatment:    Reason Eval/Treat Not Completed: Medical issues which prohibited therapy (Consult received and chart reviewed.  Patient currently receiving dialysis.  Will re-attempt next date as patient available and medically appropriate.)   Athol Bolds H. Owens Shark, PT, DPT, NCS 09/11/2015, 2:32 PM [phone]

## 2015-09-11 NOTE — ED Notes (Signed)
Family at bedside. 

## 2015-09-11 NOTE — ED Notes (Signed)
Pt with resp distress, has had missed dialysis appointment, arrives on bipap unable to speak. Skin diaphoretic, warm

## 2015-09-11 NOTE — ED Notes (Signed)
Pt cleansed of large liquid brown stool. Pt with stage one breakdown to bilateral upper buttocks.

## 2015-09-11 NOTE — ED Notes (Signed)
Report to [facility], rn.  

## 2015-09-11 NOTE — H&P (Signed)
[facility] [place] NAME: Khalique Borg    MR#:  [id number]  DATE OF BIRTH:  1941/04/15  DATE OF ADMISSION:  09/11/2015  PRIMARY CARE PHYSICIAN: Dion Body, MD   REQUESTING/REFERRING PHYSICIAN: Dr. Carrie Mew  CHIEF COMPLAINT:   Chief Complaint  Patient presents with  . Respiratory Distress    HISTORY OF PRESENT ILLNESS:  Brooke Barbour  is a 74 y.o. male with a known history of paroxysmal atrial fibrillation, combined CHF EF 30%, ESRD on Tue-Thur-Sat, chronic anemia, Overactive bladder comes from home secondary to worsening shortness of breath that started since yesterday. She has been having worsening diarrhea that has been tested negative for C. difficile in the past. Due to his diarrhea, Saturday he missed his dialysis. He had history of C. difficile colitis in the past though. His breathing was getting worse, he called EMS. When he presented to the emergency room, he had diffuse crackles and diminished air entry bilaterally. Tachypneic and hypoxic, so was started on BiPAP. He is more alert and able to talk now and still on BiPAP this morning. Chest x-ray revealing pulmonary edema.  PAST MEDICAL HISTORY:   Past Medical History  Diagnosis Date  . PAF (paroxysmal atrial fibrillation) ([place])     a. new onset 12/2014 in the setting of UTI, sepsis, hypotension, and anemia; b. not on long term anticoagulation given anemia; c. family aware of stroke risk, they are ok with this; d. on amiodarone   . Chronic combined systolic and diastolic CHF (congestive heart failure) ([place])     a. echo 12/2014: EF 25-30%, anterior wall wall motion abnormalities; b. planned ischemic evaluation once patient is stable medically; c. echo 01/2015: EF 45-50%, no RWMA, mild AT, LA mildly dilated, mod pericardial effusion along LV free wall, no evidence of hemodynamic compromise  . ESRD on hemodialysis ([place])     a. Tuesday, Thursday, and Saturdays   . Anemia     a. baseline hgb ~ 8  . History of small bowel obstruction     a. 01/2015  . History of septic shock     a. 01/2015  . GERD (gastroesophageal reflux disease)   . Allergic rhinitis   . COPD (chronic obstructive pulmonary disease) ([place])   . Hypercholesteremia   . Cognitive communication deficit   . Magnesium deficiency   . Dementia   . 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 Incontinence   . Hydronephrosis   . Overactive bladder   . Detrusor sphincter dyssynergia   . Mini stroke ([place])   . Bladder cancer ([place])   . Prostate cancer ([place])   . Renal insufficiency   . Hypertension     PAST SURGICAL HISTORY:   Past Surgical History  Procedure Laterality Date  . Cystoscopy w/ ureteral stent placement    . Transurethral resection of bladder tumor with gyrus (turbt-gyrus)    . Ureteral stent placement    . Prostate surgery      Removal  . Av fistula placement      left arm    SOCIAL HISTORY:   Social History  Substance Use Topics  . Smoking status: Former Smoker -- 0.25 packs/day for 66 years    Quit date: 05/13/2015  . Smokeless tobacco: Never Used  . Alcohol Use: No    FAMILY HISTORY:   Family History  Problem Relation Age of Onset  . Stroke Mother     DRUG ALLERGIES:   Allergies  Allergen Reactions  .  Penicillins Hives, Itching and Swelling    Has patient had a PCN reaction causing immediate rash, facial/tongue/throat swelling, SOB or lightheadedness with hypotension: Yes Has patient had a PCN reaction causing severe rash involving mucus membranes or skin necrosis: No Has patient had a PCN reaction that required hospitalization No Has patient had a PCN reaction occurring within the last 10 years: No If all of the above answers are "NO", then may proceed with Cephalosporin use.    REVIEW OF SYSTEMS:   Review of Systems  Constitutional: Positive for malaise/fatigue. Negative for fever, chills and weight loss.  HENT: Negative for ear discharge, ear pain,  nosebleeds and tinnitus.   Eyes: Negative for blurred vision, double vision and photophobia.  Respiratory: Negative for cough, hemoptysis, shortness of breath and wheezing.   Cardiovascular: Negative for chest pain, palpitations, orthopnea and leg swelling.  Gastrointestinal: Negative for heartburn, nausea, vomiting, abdominal pain, diarrhea, constipation and melena.  Genitourinary:       Anuric  Musculoskeletal: Positive for myalgias. Negative for back pain and neck pain.  Skin: Negative for rash.  Neurological: Negative for dizziness, tingling, tremors, sensory change, speech change, focal weakness and headaches.  Endo/Heme/Allergies: Does not bruise/bleed easily.  Psychiatric/Behavioral: Negative for depression.    MEDICATIONS AT HOME:   Prior to Admission medications   Medication Sig Start Date End Date Taking? Authorizing Provider  albuterol (PROVENTIL HFA;VENTOLIN HFA) 108 (90 BASE) MCG/ACT inhaler Inhale 2 puffs into the lungs every 4 (four) hours as needed for wheezing or shortness of breath. 07/24/15  Yes Fritzi Mandes, MD  albuterol (PROVENTIL) (2.5 MG/3ML) 0.083% nebulizer solution Take 3 mLs (2.5 mg total) by nebulization every 4 (four) hours as needed for wheezing or shortness of breath. 06/10/15  Yes Fritzi Mandes, MD  amLODipine (NORVASC) 2.5 MG tablet Take 2.5 mg by mouth daily.   Yes Historical Provider, MD  budesonide-formoterol (SYMBICORT) 160-4.5 MCG/ACT inhaler Inhale 2 puffs into the lungs 2 (two) times daily.   Yes Historical Provider, MD  ferrous gluconate (FERGON) 324 MG tablet Take 324 mg by mouth daily.   Yes Historical Provider, MD  sevelamer carbonate (RENVELA) 800 MG tablet Take 800 mg by mouth 3 (three) times daily before meals.    Yes Historical Provider, MD      VITAL SIGNS:  Blood pressure 121/75, pulse 80, temperature 97.9 F (36.6 C), temperature source Oral, resp. rate 27, height 6\' 2"  (1.88 m), weight 86 kg (189 lb 9.5 oz), SpO2 100 %.  PHYSICAL  EXAMINATION:   Physical Exam  GENERAL:  74 y.o.-year-old patient sitting in bed, on Bipap, with visible respiratory distress.  EYES: Pupils equal, round, reactive to light and accommodation. No scleral icterus. Extraocular muscles intact.  HEENT: Head atraumatic, normocephalic. Oropharynx and nasopharynx clear.  NECK:  Supple, no jugular venous distention. No thyroid enlargement, no tenderness.  LUNGS: Moving air bilaterally, bilateral crackles, no wheezing, rales,rhonchi or crepitation. On Bipap.  No use of accessory muscles of respiration.  CARDIOVASCULAR: S1, S2 normal. No rubs, or gallops. 3/6 systolic murmur ABDOMEN: Soft, nontender, nondistended. Bowel sounds present. No organomegaly or mass.  EXTREMITIES: No cyanosis, or clubbing. 1+ pedal edema NEUROLOGIC: Cranial nerves II through XII are intact. Muscle strength 5/5 in all extremities. Sensation intact. Gait not checked.  PSYCHIATRIC: The patient is alert and oriented x 3.  SKIN: No obvious rash, lesion, or ulcer.   LABORATORY PANEL:   CBC  Recent Labs Lab 09/11/15 1007  WBC 6.3  HGB 7.2*  HCT 23.7*  PLT 208   ------------------------------------------------------------------------------------------------------------------  Chemistries   Recent Labs Lab 09/11/15 0624 09/11/15 1007  NA 142  --   K 4.1  --   CL 105  --   CO2 27  --   GLUCOSE 152*  --   BUN 59*  --   CREATININE 4.10* 3.83*  CALCIUM 8.6*  --   AST 14*  --   ALT 8*  --   ALKPHOS 76  --   BILITOT 0.7  --    ------------------------------------------------------------------------------------------------------------------  Cardiac Enzymes  Recent Labs Lab 09/11/15 1007  TROPONINI 0.04*   ------------------------------------------------------------------------------------------------------------------  RADIOLOGY:  Dg Chest 1 View  09/11/2015  CLINICAL DATA:  Difficulty breathing beginning this morning. Initial encounter. EXAM: CHEST 1  VIEW COMPARISON:  Single view of the chest 06/06/2015, 07/23/2015 and 08/24/2015. FINDINGS: Right greater than left pleural effusions and basilar airspace disease are unchanged. There is cardiomegaly and vascular congestion without frank edema. No pneumothorax. IMPRESSION: No change in small to moderate pleural effusions, right greater than left, with associated basilar atelectasis. Cardiomegaly and pulmonary vascular congestion. Electronically Signed   By: Inge Rise M.D.   On: 09/11/2015 07:18    EKG:   Orders placed or performed during the hospital encounter of 09/11/15  . ED EKG  . ED EKG    IMPRESSION AND PLAN:   Hurtis Scimeca  is a 74 y.o. male with a known history of paroxysmal atrial fibrillation, combined CHF EF 30%, ESRD on Tue-Thur-Sat, chronic anemia, Overactive bladder comes from home secondary to worsening shortness of breath that started since yesterday.  #1 acute hypoxic respiratory failure-secondary to acute pulmonary edema. -Secondary to missing dialysis. Patient is anuric. So Lasix cannot be given. -Nephrology has been consulted stat. -Start BiPAP for now and he will need dialysis. -We will admit to the stepdown unit. -Continue to wean oxygen as tolerated.  #2 Acute pulmonary edema- management as above  #3 Diarrhea- none now, but due to h/o c.diff in past- if has any more loose stools- send for c.diff  #4 Anemia of chronic disease- epogen with dialysis - hb close to baseline - On iron supplements as well  #5 Wide complex rhythm - this am, secondary to pulmonary edema - h/o Afib - increase amiodarone dose and monitor  #6 HTN- home meds  #7 DVT Prophylaxis- on SQ heparin    All the records are reviewed and case discussed with ED provider. Management plans discussed with the patient, family and they are in agreement.  CODE STATUS: FULL CODE  TOTAL CRITICAL CARE TIME SPENT IN TAKING CARE OF THIS PATIENT: 60 minutes.    Gladstone Lighter M.D on  09/11/2015 at 3:44 PM  Between 7am to 6pm - Pager - [phone]  After 6pm go to [email or web address] - password [email or web address] [facility]  Office  [phone]  CC: Primary care physician; Dion Body, MD

## 2015-09-11 NOTE — Progress Notes (Signed)
[facility]  ROUNDING NOTE   Subjective:  Patient well known to [place] from the dialysis center. He was to have dialysis today however continues to have loose stools. State he missed dialysis Saturday because he couldn't get cleaned up from diarrhea fast enough and transportation left without him   In addition patient reports that he is quite weak. 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 Currently reports shortness of breath requiring BiPAP IM team has requested urgent evaluation for HD  Objective:  Vital signs in last 24 hours:  Temp:  [97.5 F (36.4 C)] 97.5 F (36.4 C) (12/19 0609) Pulse Rate:  [31-89] 84 (12/19 0624) Resp:  [10-35] 24 (12/19 0815) BP: (126-152)/(82-102) 130/84 mmHg (12/19 0815) SpO2:  [75 %-100 %] 100 % (12/19 0624) Weight:  [93.9 kg (207 lb 0.2 oz)] 93.9 kg (207 lb 0.2 oz) (12/19 0609)  Weight change:  Filed Weights   09/11/15 0609  Weight: 93.9 kg (207 lb 0.2 oz)    Intake/Output:     Intake/Output this shift:     Physical Exam: General: NAD, resting in gurney  Head: Normocephalic, atraumatic. Moist oral mucosal membranes  Eyes: Anicteric,    Neck: Supple, trachea midline  Lungs:   bibasilar rales, BiPAP  Heart: S1S2 no rubs  Abdomen:  Soft, nontender, bowel sounds present   Extremities:  + peripheral edema.  Neurologic: Nonfocal, moving all four extremities  Skin: No lesions  Access: LUE AVF, good thrill    Basic Metabolic Panel:  Recent Labs Lab 09/11/15 0624  NA 142  K 4.1  CL 105  CO2 27  GLUCOSE 152*  BUN 59*  CREATININE 4.10*  CALCIUM 8.6*    Liver Function Tests:  Recent Labs Lab 09/11/15 0624  AST 14*  ALT 8*  ALKPHOS 76  BILITOT 0.7  PROT 6.4*  ALBUMIN 2.9*   No results for input(s): LIPASE, AMYLASE in the last 168 hours. No results for input(s): AMMONIA in the last 168  hours.  CBC:  Recent Labs Lab 09/11/15 0624  WBC 5.4  HGB 7.6*  HCT 24.8*  MCV 88.5  PLT 220    Cardiac Enzymes:  Recent Labs Lab 09/11/15 0624  TROPONINI 0.04*    BNP: Invalid input(s): POCBNP  CBG: No results for input(s): GLUCAP in the last 168 hours.  Microbiology: Results for orders placed or performed during the hospital encounter of 08/24/15  C difficile quick scan w PCR reflex     Status: None   Collection Time: 08/24/15 12:00 PM  Result Value Ref Range Status   C Diff antigen NEGATIVE NEGATIVE Final   C Diff toxin NEGATIVE NEGATIVE Final   C Diff interpretation Negative for C. difficile  Final    Coagulation Studies: No results for input(s): LABPROT, INR in the last 72 hours.  Urinalysis: No results for input(s): COLORURINE, LABSPEC, PHURINE, GLUCOSEU, HGBUR, BILIRUBINUR, KETONESUR, PROTEINUR, UROBILINOGEN, NITRITE, LEUKOCYTESUR in the last 72 hours.  Invalid input(s): APPERANCEUR    Imaging: Dg Chest 1 View  09/11/2015  CLINICAL DATA:  Difficulty breathing beginning this morning. Initial encounter. EXAM: CHEST 1 VIEW COMPARISON:  Single view of the chest 06/06/2015, 07/23/2015 and 08/24/2015. FINDINGS: Right greater than left pleural effusions and basilar airspace disease are unchanged. There is cardiomegaly and vascular congestion without frank edema. No pneumothorax. IMPRESSION: No change in small to moderate pleural effusions, right  greater than left, with associated basilar atelectasis. Cardiomegaly and pulmonary vascular congestion. Electronically Signed   By: Inge Rise M.D.   On: 09/11/2015 07:18     Medications:       Assessment/ Plan:  74 y.o. male with end-stage renal disease ,  hx of small bowel obstruction in May 2016, GERD, COPD, bladder cancer, history of TURBT, anemia of CKD, secondary hyperparathyroidism, recurrent C. Diff colitis.   1. End-stage renal disease on hemodialysis- TTS-1/ [address] [facility]/   The patient does  appear to be in need of dialysis today as there appears to be pulmonary edema . We will plan for dialysis with ultrafiltration   2. Acute Pulm Edema - urgent HD today - UF as toelrated  3. Secondary hyperparathyroidism. We will monitor phosphorus during hospitalization  4. Diarrhea. The patient has had recurrent diarrhea and has had bouts of C. difficile colitis in the past.  C. difficile toxin to be sent today.  5. Generalized weakness. The patient has been receiving physical therapy on an outpatient basis. The patient's family has previously reports that it has been difficult to take care of him. He may end up requiring placement in a facility.  6. Anemia of chronic kidney disease. Hemoglobin currently low at 7.6  We will resume the patient on Epogen with dialysis.    LOS: 0 Marji Kuehnel 12/19/20169:57 AM

## 2015-09-11 NOTE — ED Notes (Signed)
Admitting MD at bedside.

## 2015-09-11 NOTE — ED Notes (Signed)
Pt had 9 beat run of wide complex QRS. Strip printed. MD informed.

## 2015-09-12 LAB — BASIC METABOLIC PANEL
Anion gap: 8 (ref 5–15)
BUN: 36 mg/dL — AB (ref 6–20)
CO2: 32 mmol/L (ref 22–32)
CREATININE: 2.83 mg/dL — AB (ref 0.61–1.24)
Calcium: 8.4 mg/dL — ABNORMAL LOW (ref 8.9–10.3)
Chloride: 104 mmol/L (ref 101–111)
GFR calc Af Amer: 24 mL/min — ABNORMAL LOW (ref >60)
GFR, EST NON AFRICAN AMERICAN: 21 mL/min — AB (ref >60)
GLUCOSE: 126 mg/dL — AB (ref 65–99)
POTASSIUM: 3.7 mmol/L (ref 3.5–5.1)
Sodium: 144 mmol/L (ref 135–145)

## 2015-09-12 LAB — CLOSTRIDIUM DIFFICILE BY PCR: CDIFFPCR: NEGATIVE

## 2015-09-12 LAB — CBC
HEMATOCRIT: 22.1 % — AB (ref 40.0–52.0)
HEMOGLOBIN: 7 g/dL — AB (ref 13.0–18.0)
MCH: 27.5 pg (ref 26.0–34.0)
MCHC: 31.5 g/dL — ABNORMAL LOW (ref 32.0–36.0)
MCV: 87.3 fL (ref 80.0–100.0)
Platelets: 164 10*3/uL (ref 150–440)
RBC: 2.53 MIL/uL — AB (ref 4.40–5.90)
RDW: 17.3 % — ABNORMAL HIGH (ref 11.5–14.5)
WBC: 4.8 10*3/uL (ref 3.8–10.6)

## 2015-09-12 LAB — C DIFFICILE QUICK SCREEN W PCR REFLEX
C DIFFICILE (CDIFF) TOXIN: NEGATIVE
C DIFFICLE (CDIFF) ANTIGEN: POSITIVE — AB

## 2015-09-12 LAB — HEPATITIS B SURFACE ANTIGEN: HEP B S AG: NEGATIVE

## 2015-09-12 NOTE — Progress Notes (Signed)
Patient transferred to unit, received report from [place]. Patient is oriented to room and safety protocol has been reviewed. Skin assessed with Patrice Paradise. Patient has stage I pressure ulcer on sacral. Foam dressing in place. Telemetry box verified. Will continue to monitor patient. Horton Finer

## 2015-09-12 NOTE — Evaluation (Signed)
Physical Therapy Evaluation Patient Details Name: [name] MRN: [id number] DOB: Apr 30, 1941 Today's Date: 09/12/2015   History of Present Illness  presented to R secondary to worsening SOB, diarrhea; admitted with acute hypoxic respiratory failure related to pulmonary edema (result of missed dialysis treatment)  Clinical Impression  Upon evaluation, patient alert and oriented; follows all commands and demonstrates good insight into deficits.  Bilat UE/LEs globally weak and deconditioned; unable to support body weight in closed-chain position during all standing attempts.  Very high risk for buckling/fall. Currrently requiring min/mod assist for bed mobility; mod/max assist for bed/recliner via lateral scoot transfer. Unable to achieve full stance despite max assist from therapist. Unsafe to attempt any mobility without direct hands-on assist at this time. Would benefit from skilled PT to address above deficits and promote optimal return to PLOF; recommend transition to STR upon discharge from acute hospitalization.     Follow Up Recommendations SNF    Equipment Recommendations       Recommendations for Other Services       Precautions / Restrictions Precautions Precautions: Fall Precaution Comments: Enteric isolation, No BP L UE Restrictions Weight Bearing Restrictions: No      Mobility  Bed Mobility Overal bed mobility: Needs Assistance Bed Mobility: Supine to Sit     Supine to sit: Min assist;Mod assist     General bed mobility comments: for truncal elevation  Transfers Overall transfer level: Needs assistance   Transfers: Sit to/from Stand Sit to Stand: Max assist         General transfer comment: unable to achieve full stance despite max assist from therapist; poor co-contraction of bilat LEs in closed-chain position with poor ability to support body weight  Ambulation/Gait             General Gait Details: unsafe/unable  Stairs            Wheelchair Mobility    Modified Rankin (Stroke Patients Only)       Balance Overall balance assessment: Needs assistance Sitting-balance support: No upper extremity supported;Feet supported Sitting balance-Leahy Scale: Fair     Standing balance support: Bilateral upper extremity supported Standing balance-Leahy Scale: Zero                               Pertinent Vitals/Pain Pain Assessment: No/denies pain    Home Living Family/patient expects to be discharged to:: Private residence Living Arrangements: Other relatives Available Help at Discharge: Family (not available for 24 hour sup/assist) Type of Home: House Home Access: Stairs to enter       Home Equipment: Environmental consultant - 2 wheels;Wheelchair - manual      Prior Function Level of Independence: Needs assistance         Comments: Utilizes manual WC as primary mobility; requires assist for all standing attempts and has been working on SB transfers in outpatient PT     Hand Dominance        Extremity/Trunk Assessment   Upper Extremity Assessment:  (significant shoulder elevation limitations (due to chronic arthric changes), R > L;  strength grossly 3/5 throughout available range)           Lower Extremity Assessment: Generalized weakness (globally weak and deconditioned, visibly atrophied distal LEs; strength grossly 3-/5)         Communication   Communication: No difficulties (hypophonic)  Cognition Arousal/Alertness: Awake/alert Behavior During Therapy: WFL for tasks assessed/performed Overall Cognitive Status: Within Functional Limits  for tasks assessed                      General Comments      Exercises Other Exercises Other Exercises: Lateral transfer, bed/WC over level surfaces, mod/max assist +1.  constant verbal/tactile cuing for foward trunk lean and anteriro weight translation.  Min cuing for hand placement to facilitate transfer.      Assessment/Plan    PT  Assessment Patient needs continued PT services  PT Diagnosis Difficulty walking;Generalized weakness   PT Problem List Decreased strength;Decreased range of motion;Decreased activity tolerance;Decreased balance;Decreased mobility;Decreased knowledge of use of DME;Decreased safety awareness;Decreased knowledge of precautions;Cardiopulmonary status limiting activity  PT Treatment Interventions DME instruction;Gait training;Stair training;Functional mobility training;Therapeutic activities;Therapeutic exercise;Balance training;Patient/family education   PT Goals (Current goals can be found in the Care Plan section) Acute Rehab PT Goals Patient Stated Goal: "to get to a facility for a little while" PT Goal Formulation: With patient Time For Goal Achievement: 09/26/15 Potential to Achieve Goals: Good    Frequency Min 2X/week   Barriers to discharge Decreased caregiver support      Co-evaluation               End of Session   Activity Tolerance: Patient tolerated treatment well;Patient limited by fatigue Patient left: in chair;with call bell/phone within reach (alarm pad under patient, box not available; RN informed/aware and to place box as able) Nurse Communication: Mobility status (recommendations for lateral transfer for return to bed; no standing attempts recommended)         Time: [id number] PT Time Calculation (min) (ACUTE ONLY): 22 min   Charges:   PT Evaluation $Initial PT Evaluation Tier I: 1 Procedure PT Treatments $Therapeutic Activity: 8-22 mins   PT G Codes:        Tawan Degroote H. Owens Shark, PT, DPT, NCS 09/12/2015, 11:20 AM [phone]

## 2015-09-12 NOTE — Clinical Social Work Note (Signed)
Patient's cousin, [name], called CSW back and stated that she is working currently with the insurance company and [facility] as there are some date discrepancies and patient should not have a copayment. Ms. [name] realizes that patient is to discharge tomorrow. Ms. [name] stated that if [facility] is not able to offer in the morning, that they will go with [facility]. [name] MSW,LCSW [phone]

## 2015-09-12 NOTE — Clinical Documentation Improvement (Signed)
Internal Medicine  Acute Pulmonary Edema is documented by Renal in his 12/19 progress note. Please clarify if Pulmonary Edema is related to:    Please document findings in next progress note; not in BPA drop down box.    Cardiogenic -  Acute Pulmonary Edema related to patient's history of CHF - if yes, indicate type and acuity of CHF  Non Cardiogenic - Acute Pulmonary Edema related to patient's ESRD; missed HD session prior to admission  Other  Clinically Undetermined  Supporting Information:  Indicators identified above.  Please exercise your independent, professional judgment when responding. A specific answer is not anticipated or expected.  Thank You, Zoila Shutter RN, BSN, [place] [phone]; Cell: [phone]

## 2015-09-12 NOTE — Progress Notes (Signed)
Pt up in recliner. MD in room at bedside. Report called to telemetry nurse. Transport called for transfer to room 240.

## 2015-09-12 NOTE — Care Management (Signed)
Transferred out of icu to 2A.  Patient is agreeable to physical therapy recommendation of skilled nursing placement.  On room air and not requiring bipap.  VSS.  If all remains stable, it is possible patient could discharge to skilled nursing facility within the next 24 hours

## 2015-09-12 NOTE — Clinical Social Work Note (Signed)
Clinical Social Work Assessment  Patient Details  Name: [name] MRN: [id number] Date of Birth: [birth year]  Date of referral:  09/12/15               Reason for consult:  Facility Placement                Permission sought to share information with:  Family Supports, Chartered certified accountant granted to share information::  Yes, Verbal Permission Granted  Name::        Agency::     Relationship::     Contact Information:     Housing/Transportation Living arrangements for the past 2 months:  [place], [place] of Information:  Patient Patient Interpreter Needed:  None Criminal Activity/Legal Involvement Pertinent to Current Situation/Hospitalization:  No - Comment as needed Significant Relationships:  Other Family Members Lives with:  Self Do you feel safe going back to the place where you live?    Need for family participation in patient care:     Care giving concerns:  Patient lives alone.   Social Worker assessment / plan:  CSW met with patient right after PT finished assessing and PT is recommending STR. Patient has been in and out of STR facilities and has been in both [facility] and [facility]. CSW met with patient this afternoon and he informed CSW that he was wanting to go to rehab and preferred [facility]. Patient thought he owed money to [facility]. CSW initiated a bed search today and was informed by [facility] that patient owes $3200 and that he would have to pay that prior to being able to go there. CSW checked with [facility] and the balance owed to them is $85.00. [facility] is willing to take patient without making patient pay this up front. CSW explained this to patient and he stated he would be fine with going to [facility] but wanted me to check with his family member: [name] first. CSW has left a message for Ms. Cousin.   Employment status:  Retired Nurse, adult PT Recommendations:  [place] /  Referral to community resources:     Patient/Family's Response to care:  Patient is pleasant and cooperative.  Patient/Family's Understanding of and Emotional Response to Diagnosis, Current Treatment, and Prognosis:  Patient is willing to do what he needs to try and progress in his recovery.  Emotional Assessment Appearance:  Appears stated age Attitude/Demeanor/Rapport:   (pleasant and cooperative) Affect (typically observed):  Accepting, Adaptable, Calm, Appropriate Orientation:  Oriented to Self, Oriented to Place, Oriented to  Time, Oriented to Situation Alcohol / Substance use:  Not Applicable Psych involvement (Current and /or in the community):  No (Comment)  Discharge Needs  Concerns to be addressed:  Care Coordination Readmission within the last 30 days:  No Current discharge risk:  None Barriers to Discharge:  No Barriers Identified   [name], LCSW 09/12/2015, 2:18 PM

## 2015-09-12 NOTE — NC FL2 (Signed)
[place] LEVEL OF CARE SCREENING TOOL     IDENTIFICATION  Patient Name: [name] Birthdate: 08/13/1941 Sex: male Admission Date (Current Location): 09/11/2015  [place] and [place] Number:  Engineering geologist and Address:  [facility], [address] [place], [place] [address]      Provider Number: [id number]  Attending Physician Name and Address:  Gladstone Lighter, MD  Relative Name and Phone Number:       Current Level of Care: Hospital Recommended Level of Care: [place] Prior Approval Number:    Date Approved/Denied:   PASRR Number:  [id number]  Discharge Plan: SNF    Current Diagnoses: Patient Active Problem List   Diagnosis Date Noted  . Acute respiratory failure ([place]) 09/11/2015  . Chronic diastolic heart failure ([place]) 08/11/2015  . Fluid overload 07/23/2015  . Generalized weakness 05/08/2015  . Other emphysema ([place]) 05/08/2015  . Tobacco use disorder 05/08/2015  . Hypokalemia 05/08/2015  . Pressure ulcer 05/05/2015  . Symptomatic anemia 05/04/2015  . CAFL (chronic airflow limitation) ([place]) 04/10/2015  . Malnutrition of moderate degree ([place]) 03/22/2015  . Pneumonia 03/20/2015  . History of sepsis 02/27/2015  . Incontinence 02/27/2015  . Persistent atrial fibrillation ([place]) 02/27/2015  . Preoperative cardiovascular examination 02/27/2015  . Chronic combined systolic and diastolic CHF (congestive heart failure) ([place])   . ESRD on hemodialysis ([place])   . Anemia   . History of small bowel obstruction   . Atrial fibrillation with RVR ([place]) 01/21/2015  . Chronic kidney disease (CKD), stage IV (severe) ([place]) 03/01/2014  . Essential (primary) hypertension 03/01/2014  . 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 Cystitis 11/11/2012  . Hydronephrosis 08/27/2012  . Bladder cancer ([place]) 08/27/2012  . CA of prostate ([place]) 08/27/2012  . Bone metastases ([place]) 08/27/2012    Orientation RESPIRATION  BLADDER Height & Weight    Self, Time, Situation, Place  Normal Incontinent   185 lbs.  BEHAVIORAL SYMPTOMS/MOOD NEUROLOGICAL BOWEL NUTRITION STATUS   (none)  (none) Incontinent Diet  AMBULATORY STATUS COMMUNICATION OF NEEDS Skin   Extensive Assist Verbally PU Stage and Appropriate Care                       Personal Care Assistance Level of Assistance  Bathing, Dressing Bathing Assistance: Limited assistance   Dressing Assistance: Limited assistance     Functional Limitations Info  Hearing   Hearing Info: Impaired      SPECIAL CARE FACTORS FREQUENCY  PT (By licensed PT)                    Contractures Contractures Info: Not present    Additional Factors Info  Allergies   Allergies Info: PCN's           Current Medications (09/12/2015):  This is the current hospital active medication list Current Facility-Administered Medications  Medication Dose Route Frequency Provider Last Rate Last Dose  . 0.9 %  sodium chloride infusion  250 mL Intravenous PRN Gladstone Lighter, MD      . acetaminophen (TYLENOL) tablet 650 mg  650 mg Oral Q6H PRN Gladstone Lighter, MD       Or  . acetaminophen (TYLENOL) suppository 650 mg  650 mg Rectal Q6H PRN Gladstone Lighter, MD      . amiodarone (PACERONE) tablet 200 mg  200 mg Oral Daily Gladstone Lighter, MD   200 mg at 09/12/15 0933  . amLODipine (NORVASC) tablet 2.5 mg  2.5  mg Oral Daily Gladstone Lighter, MD   2.5 mg at 09/12/15 0933  . atorvastatin (LIPITOR) tablet 40 mg  40 mg Oral Daily Gladstone Lighter, MD   40 mg at 09/12/15 0933  . budesonide-formoterol (SYMBICORT) 160-4.5 MCG/ACT inhaler 2 puff  2 puff Inhalation BID Gladstone Lighter, MD   2 puff at 09/12/15 0808  . epoetin alfa (EPOGEN,PROCRIT) injection 10,000 Units  10,000 Units Intravenous Q T,Th,Sa-HD Harmeet Singh, MD      . feeding supplement (NEPRO CARB STEADY) liquid 237 mL  237 mL Oral BID BM Gladstone Lighter, MD   237 mL at 09/12/15 1053  . ferrous  sulfate tablet 325 mg  325 mg Oral Q breakfast Gladstone Lighter, MD   325 mg at 09/12/15 0806  . guaiFENesin (MUCINEX) 12 hr tablet 600 mg  600 mg Oral BID Gladstone Lighter, MD   600 mg at 09/12/15 0933  . heparin injection 5,000 Units  5,000 Units Subcutaneous 3 times per day Gladstone Lighter, MD   5,000 Units at 09/12/15 0514  . ipratropium-albuterol (DUONEB) 0.5-2.5 (3) MG/3ML nebulizer solution 3 mL  3 mL Nebulization Q4H PRN Gladstone Lighter, MD      . magnesium oxide (MAG-OX) tablet 400 mg  400 mg Oral QPM Gladstone Lighter, MD   400 mg at 09/11/15 1728  . ondansetron (ZOFRAN) tablet 4 mg  4 mg Oral Q6H PRN Gladstone Lighter, MD       Or  . ondansetron (ZOFRAN) injection 4 mg  4 mg Intravenous Q6H PRN Gladstone Lighter, MD      . polyethylene glycol (MIRALAX / GLYCOLAX) packet 17 g  17 g Oral Daily Gladstone Lighter, MD   17 g at 09/11/15 1130  . sevelamer carbonate (RENVELA) tablet 800 mg  800 mg Oral TID AC Gladstone Lighter, MD   800 mg at 09/12/15 1158  . sodium chloride 0.9 % injection 3 mL  3 mL Intravenous Q12H Gladstone Lighter, MD   3 mL at 09/11/15 2105  . sodium chloride 0.9 % injection 3 mL  3 mL Intravenous Q12H Gladstone Lighter, MD   3 mL at 09/12/15 1000  . sodium chloride 0.9 % injection 3 mL  3 mL Intravenous PRN Gladstone Lighter, MD   3 mL at 09/11/15 1133  . tiotropium (SPIRIVA) inhalation capsule 18 mcg  18 mcg Inhalation Daily Gladstone Lighter, MD   18 mcg at 09/12/15 [id number]     Discharge Medications: Please see discharge summary for a list of discharge medications.  Relevant Imaging Results:  Relevant Lab Results:   Additional Information SS: [id number]  [name], LCSW

## 2015-09-12 NOTE — Progress Notes (Signed)
[place] [facility] [place] NAME: [name]    MR#:  [id number]  DATE OF BIRTH:  02-04-1941  SUBJECTIVE:  CHIEF COMPLAINT:   Chief Complaint  Patient presents with  . Respiratory Distress   - Admitted for pulmonary edema requiring BiPAP. Currently off of oxygen and breathing much better after dialysis. Patient did miss dialysis prior to admission. -Will be transferred to floor later today. No complaints  REVIEW OF SYSTEMS:  Review of Systems  Constitutional: Negative for fever and chills.  HENT: Negative for ear discharge, ear pain and tinnitus.   Eyes: Negative for blurred vision.  Respiratory: Negative for cough, shortness of breath and wheezing.   Cardiovascular: Negative for chest pain and palpitations.  Gastrointestinal: Negative for nausea, vomiting, abdominal pain, diarrhea and constipation.  Genitourinary: Negative for dysuria.       Anuric  Neurological: Positive for weakness. Negative for dizziness, sensory change, speech change, focal weakness, seizures and headaches.  Psychiatric/Behavioral: Negative for depression.    DRUG ALLERGIES:   Allergies  Allergen Reactions  . Penicillins Hives, Itching and Swelling    Has patient had a PCN reaction causing immediate rash, facial/tongue/throat swelling, SOB or lightheadedness with hypotension: Yes Has patient had a PCN reaction causing severe rash involving mucus membranes or skin necrosis: No Has patient had a PCN reaction that required hospitalization No Has patient had a PCN reaction occurring within the last 10 years: No If all of the above answers are "NO", then may proceed with Cephalosporin use.    VITALS:  Blood pressure 145/81, pulse 83, temperature 98.1 F (36.7 C), temperature source Oral, resp. rate 18, height 6\' 2"  (1.88 m), weight 84 kg (185 lb 3 oz), SpO2 94 %.  PHYSICAL EXAMINATION:  Physical Exam  GENERAL: 74 y.o.-year-old patient sitting in bed,  not in any distress. Off oxygen today.Marland Kitchen  EYES: Pupils equal, round, reactive to light and accommodation. No scleral icterus. Extraocular muscles intact.  HEENT: Head atraumatic, normocephalic. Oropharynx and nasopharynx clear.  NECK: Supple, no jugular venous distention. No thyroid enlargement, no tenderness.  LUNGS: Moving air bilaterally, no crackles, no wheezing, rales,rhonchi or crepitation. Decreased bibasilar breath sounds noted.. No use of accessory muscles of respiration.  CARDIOVASCULAR: S1, S2 normal. No rubs, or gallops. 3/6 systolic murmur ABDOMEN: Soft, nontender, nondistended. Bowel sounds present. No organomegaly or mass.  EXTREMITIES: No cyanosis, or clubbing. 1+ pedal edema NEUROLOGIC: Cranial nerves II through XII are intact. Muscle strength 5/5 in all extremities. Sensation intact. Gait not checked.  PSYCHIATRIC: The patient is alert and oriented x 3.  SKIN: No obvious rash, lesion, or ulcer.     LABORATORY PANEL:   CBC  Recent Labs Lab 09/12/15 0405  WBC 4.8  HGB 7.0*  HCT 22.1*  PLT 164   ------------------------------------------------------------------------------------------------------------------  Chemistries   Recent Labs Lab 09/11/15 0624  09/12/15 0405  NA 142  --  144  K 4.1  --  3.7  CL 105  --  104  CO2 27  --  32  GLUCOSE 152*  --  126*  BUN 59*  --  36*  CREATININE 4.10*  < > 2.83*  CALCIUM 8.6*  --  8.4*  AST 14*  --   --   ALT 8*  --   --   ALKPHOS 76  --   --   BILITOT 0.7  --   --   < > = values in this interval not displayed. ------------------------------------------------------------------------------------------------------------------  Cardiac Enzymes  Recent Labs Lab 09/11/15 2010  TROPONINI 0.04*   ------------------------------------------------------------------------------------------------------------------  RADIOLOGY:  Dg Chest 1 View  09/11/2015  CLINICAL DATA:  Difficulty breathing beginning  this morning. Initial encounter. EXAM: CHEST 1 VIEW COMPARISON:  Single view of the chest 06/06/2015, 07/23/2015 and 08/24/2015. FINDINGS: Right greater than left pleural effusions and basilar airspace disease are unchanged. There is cardiomegaly and vascular congestion without frank edema. No pneumothorax. IMPRESSION: No change in small to moderate pleural effusions, right greater than left, with associated basilar atelectasis. Cardiomegaly and pulmonary vascular congestion. Electronically Signed   By: Inge Rise M.D.   On: 09/11/2015 07:18    EKG:   Orders placed or performed during the hospital encounter of 09/11/15  . ED EKG  . ED EKG    ASSESSMENT AND PLAN:   [name] is a 74 y.o. male with a known history of paroxysmal atrial fibrillation, combined CHF EF 30%, ESRD on Tue-Thur-Sat, chronic anemia, Overactive bladder comes from home secondary to worsening shortness of breath that started since yesterday.  #1 acute hypoxic respiratory failure-secondary to acute pulmonary edema. -Noncardiogenic pulmonary edema due to end-stage renal disease and missing dialysis -Improved after dialysis. Off BiPAP. Off nasal cannula at this time. -Appreciate nephrology consult  #2 Acute pulmonary edema- management as above  #3 Diarrhea- none now, but due to h/o c.diff in past  #4 Anemia of chronic disease- epogen with dialysis - hb close to baseline - On iron supplements as well  #5 Wide complex rhythm - on admission, likely from his pulmonary edema. -If recurs-Will increase his amiodarone. - h/o Afib  #6 HTN- home meds  #7 DVT prophylaxis-on subcutaneous heparin  Physical therapy consulted. Recommended rehabilitation. -Discharged to rehabilitation tomorrow    All the records are reviewed and case discussed with Care Management/Social Workerr. Management plans discussed with the patient, family and they are in agreement.  CODE STATUS: Full Code  TOTAL TIME TAKING CARE OF  THIS PATIENT: 40 minutes.   POSSIBLE D/C tomorrow, DEPENDING ON CLINICAL CONDITION.   Gladstone Lighter M.D on 09/12/2015 at 2:47 PM  Between 7am to 6pm - Pager - [phone]  After 6pm go to [email or web address] - password [email or web address] [place] [facility]  Office  [phone]  CC: Primary care physician; Dion Body, MD

## 2015-09-12 NOTE — Clinical Social Work Note (Signed)
Multiple consults placed for CSW to see patient regarding possible rehab. CSW documented yesterday stating that waiting on PT recommendations. CSW returned to see patient today and PT was working with patient at time of visit. CSW informed patient I would return. [name] MSW,LCSW [phone]

## 2015-09-12 NOTE — Progress Notes (Signed)
Pt rested overnight no compliants. VSS. Anticipate transfer to 2A when bed available

## 2015-09-12 NOTE — Progress Notes (Signed)
[facility]  ROUNDING NOTE   Subjective:  Seen earlier today Sitting up in the chair Breathing has improved significantly 2000 cc we'll do dialysis yesterday   HEMODIALYSIS FLOWSHEET:12/19  Blood Flow Rate (mL/min): 400 mL/min Arterial Pressure (mmHg): -180 mmHg Venous Pressure (mmHg): 160 mmHg Transmembrane Pressure (mmHg): 60 mmHg Ultrafiltration Rate (mL/min): 860 mL/min Dialysate Flow Rate (mL/min): 800 ml/min Conductivity: Machine : 14.2 Conductivity: Machine : 14.2 Dialysis Fluid Bolus: Normal Saline Bolus Amount (mL): 250 mL Intra-Hemodialysis Comments: 3065ml.Marland KitchenMarland KitchenTx completed    Objective:  Vital signs in last 24 hours:  Temp:  [97.4 F (36.3 C)-98.6 F (37 C)] 98.1 F (36.7 C) (12/20 1237) Pulse Rate:  [74-87] 83 (12/20 1237) Resp:  [18-26] 18 (12/20 1237) BP: (123-149)/(66-95) 145/81 mmHg (12/20 1237) SpO2:  [94 %-100 %] 94 % (12/20 1237)  Weight change: -7.9 kg (-17 lb 6.7 oz) Filed Weights   09/11/15 1000 09/11/15 1300 09/11/15 1715  Weight: 86 kg (189 lb 9.5 oz) 86 kg (189 lb 9.5 oz) 84 kg (185 lb 3 oz)    Intake/Output: I/O last 3 completed shifts: In: 2 [P.O.:60] Out: 2500 [Other:2500]   Intake/Output this shift:  Total I/O In: 30 [P.O.:30] Out: -   Physical Exam: General: NAD, sitting up in chair  Head: Normocephalic, atraumatic. Moist oral mucosal membranes  Eyes: Anicteric,    Neck: Supple, trachea midline  Lungs:   normal respiratory effort, mild basilar crackles  Heart: S1S2 no rubs  Abdomen:  Soft, nontender, bowel sounds present   Extremities:  + peripheral edema.  Neurologic: Nonfocal, moving all four extremities  Skin: No lesions  Access: LUE AVF, good thrill    Basic Metabolic Panel:  Recent Labs Lab 09/11/15 0624 09/11/15 1007 09/12/15 0405  NA 142  --  144  K 4.1  --  3.7  CL 105  --  104  CO2 27  --  32  GLUCOSE 152*  --  126*  BUN 59*  --  36*  CREATININE 4.10* 3.83* 2.83*  CALCIUM 8.6*  --  8.4*     Liver Function Tests:  Recent Labs Lab 09/11/15 0624  AST 14*  ALT 8*  ALKPHOS 76  BILITOT 0.7  PROT 6.4*  ALBUMIN 2.9*   No results for input(s): LIPASE, AMYLASE in the last 168 hours. No results for input(s): AMMONIA in the last 168 hours.  CBC:  Recent Labs Lab 09/11/15 0624 09/11/15 1007 09/12/15 0405  WBC 5.4 6.3 4.8  HGB 7.6* 7.2* 7.0*  HCT 24.8* 23.7* 22.1*  MCV 88.5 87.7 87.3  PLT 220 208 164    Cardiac Enzymes:  Recent Labs Lab 09/11/15 0624 09/11/15 1007 09/11/15 1809 09/11/15 2010  TROPONINI 0.04* 0.04* 0.04* 0.04*    BNP: Invalid input(s): POCBNP  CBG: No results for input(s): GLUCAP in the last 168 hours.  Microbiology: Results for orders placed or performed during the hospital encounter of 09/11/15  MRSA PCR Screening     Status: None   Collection Time: 09/11/15 11:30 AM  Result Value Ref Range Status   MRSA by PCR NEGATIVE NEGATIVE Final    Comment:        The GeneXpert MRSA Assay (FDA approved for NASAL specimens only), is one component of a comprehensive MRSA colonization surveillance program. It is not intended to diagnose MRSA infection nor to guide or monitor treatment for MRSA infections.   C difficile quick scan w PCR reflex     Status: Abnormal   Collection Time: 09/12/15  12:40 PM  Result Value Ref Range Status   C Diff antigen POSITIVE (A) NEGATIVE Final   C Diff toxin NEGATIVE NEGATIVE Final   C Diff interpretation   Final    Negative for toxigenic C. difficile. Toxin gene and active toxin production not detected. May be a nontoxigenic strain of C. difficile bacteria present, lacking the ability to produce toxin.  Clostridium Difficile by PCR     Status: None   Collection Time: 09/12/15 12:40 PM  Result Value Ref Range Status   Toxigenic C Difficile by pcr NEGATIVE NEGATIVE Final    Coagulation Studies: No results for input(s): LABPROT, INR in the last 72 hours.  Urinalysis: No results for input(s):  COLORURINE, LABSPEC, PHURINE, GLUCOSEU, HGBUR, BILIRUBINUR, KETONESUR, PROTEINUR, UROBILINOGEN, NITRITE, LEUKOCYTESUR in the last 72 hours.  Invalid input(s): APPERANCEUR    Imaging: Dg Chest 1 View  09/11/2015  CLINICAL DATA:  Difficulty breathing beginning this morning. Initial encounter. EXAM: CHEST 1 VIEW COMPARISON:  Single view of the chest 06/06/2015, 07/23/2015 and 08/24/2015. FINDINGS: Right greater than left pleural effusions and basilar airspace disease are unchanged. There is cardiomegaly and vascular congestion without frank edema. No pneumothorax. IMPRESSION: No change in small to moderate pleural effusions, right greater than left, with associated basilar atelectasis. Cardiomegaly and pulmonary vascular congestion. Electronically Signed   By: Inge Rise M.D.   On: 09/11/2015 07:18     Medications:       Assessment/ Plan:  74 y.o. male with end-stage renal disease ,  hx of small bowel obstruction in May 2016, GERD, COPD, bladder cancer, history of TURBT, anemia of CKD, secondary hyperparathyroidism, recurrent C. Diff colitis.   1. End-stage renal disease on hemodialysis- TTS-1/ [address] [facility]/   extra treatment today to assist with volume removal and to place him back on his regular schedule  2. Acute Pulm Edema - Volume overload with dialysis as tolerated  3. Secondary hyperparathyroidism. We will monitor phosphorus during hospitalization  4. Diarrhea. The patient has had recurrent diarrhea and has had bouts of C. difficile colitis in the past.  C. difficile toxin negative  5. Generalized weakness. The patient has been receiving physical therapy on an outpatient basis. The patient's family has previously reports that it has been difficult to take care of him. He may end up requiring placement in a facility.  6. Anemia of chronic kidney disease. Hemoglobin currently low at 7.0  We will resume the patient on Epogen with dialysis.    LOS:  1 Anna Livers 12/20/20165:29 PM

## 2015-09-12 NOTE — Progress Notes (Signed)
Initial Nutrition Assessment  INTERVENTION:   Meals and Snacks: Cater to patient preferences Medical Food Supplement Therapy: continue Nepro BID  NUTRITION DIAGNOSIS:   Reassess on follow-up  GOAL:   Patient will meet greater than or equal to 90% of their needs  MONITOR:    (Energy Intake, Anthropometrics, Digestive System, Electrolyte/Renal Profile)  REASON FOR ASSESSMENT:    (Renal Diet, Dialysis)    ASSESSMENT:    Pt admitted with acute respiratory failure with pulmonary edema requiring urgent HD, diarrhea with hx of C.diff  Past Medical History  Diagnosis Date  . PAF (paroxysmal atrial fibrillation) ([place])     a. new onset 12/2014 in the setting of UTI, sepsis, hypotension, and anemia; b. not on long term anticoagulation given anemia; c. family aware of stroke risk, they are ok with this; d. on amiodarone   . Chronic combined systolic and diastolic CHF (congestive heart failure) ([place])     a. echo 12/2014: EF 25-30%, anterior wall wall motion abnormalities; b. planned ischemic evaluation once patient is stable medically; c. echo 01/2015: EF 45-50%, no RWMA, mild AT, LA mildly dilated, mod pericardial effusion along LV free wall, no evidence of hemodynamic compromise  . ESRD on hemodialysis ([place])     a. Tuesday, Thursday, and Saturdays  . Anemia     a. baseline hgb ~ 8  . History of small bowel obstruction     a. 01/2015  . History of septic shock     a. 01/2015  . GERD (gastroesophageal reflux disease)   . Allergic rhinitis   . COPD (chronic obstructive pulmonary disease) ([place])   . Hypercholesteremia   . Cognitive communication deficit   . Magnesium deficiency   . Dementia   . 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 Incontinence   . Hydronephrosis   . Overactive bladder   . Detrusor sphincter dyssynergia   . Mini stroke ([place])   . Bladder cancer ([place])   . Prostate cancer ([place])   . Renal insufficiency   . Hypertension    Diet Order:  Diet renal with fluid restriction Fluid  restriction:: 1200 mL Fluid; Room service appropriate?: Yes; Fluid consistency:: Thin   Energy Intake: pt ate 100% at breakfast this AM, appetite great. Pt receiving Nepro BID, reports he is drinking  Food and Nutrition related history:  Pt reports good appetite at home, eating well. Pt does not use supplements at home but reports he does receive one when he goes to dialysis  Skin:   (stage I buttock )  Last BM:  12/20   Electrolyte and Renal Profile:  Recent Labs Lab 09/11/15 0624 09/11/15 1007 09/12/15 0405  BUN 59*  --  36*  CREATININE 4.10* 3.83* 2.83*  NA 142  --  144  K 4.1  --  3.7   Meds: renvela, miralax   Height:   Ht Readings from Last 1 Encounters:  09/11/15 6\' 2"  (1.88 m)    Weight: pt reports he thinks weight has been stable  Wt Readings from Last 1 Encounters:  09/11/15 185 lb 3 oz (84 kg)    BMI:  Body mass index is 23.77 kg/(m^2).  LOW Care Level   [place] Passey MS, [place], LDN [phone] Pager  [phone] Weekend/On-Call Pager

## 2015-09-13 ENCOUNTER — Ambulatory Visit: Payer: Medicare Other | Admitting: Physical Therapy

## 2015-09-13 MED ORDER — AMIODARONE HCL 200 MG PO TABS: 200.0000 mg | ORAL_TABLET | Freq: Every day | ORAL | Status: AC

## 2015-09-13 MED ORDER — FERROUS SULFATE 325 (65 FE) MG PO TABS: 325.0000 mg | ORAL_TABLET | Freq: Every day | ORAL | Status: DC

## 2015-09-13 MED ORDER — MAGNESIUM OXIDE 400 MG PO TABS: 400.0000 mg | ORAL_TABLET | Freq: Every evening | ORAL | Status: AC

## 2015-09-13 MED ORDER — NEPRO/CARBSTEADY PO LIQD: 237.0000 mL | Freq: Two times a day (BID) | ORAL | Status: DC

## 2015-09-13 MED ORDER — TIOTROPIUM BROMIDE MONOHYDRATE 18 MCG IN CAPS: 18.0000 ug | ORAL_CAPSULE | Freq: Every day | RESPIRATORY_TRACT | Status: AC

## 2015-09-13 MED ORDER — ATORVASTATIN CALCIUM 40 MG PO TABS: 40.0000 mg | ORAL_TABLET | Freq: Every day | ORAL | Status: DC

## 2015-09-13 MED ORDER — GUAIFENESIN ER 600 MG PO TB12: 600.0000 mg | ORAL_TABLET | Freq: Two times a day (BID) | ORAL | Status: DC

## 2015-09-13 MED ORDER — POLYETHYLENE GLYCOL 3350 17 G PO PACK: 17.0000 g | PACK | Freq: Every day | ORAL | Status: DC

## 2015-09-13 NOTE — Progress Notes (Signed)
Spoke with [name], [facility] rep at [phone], to notify of non-emergent EMS transport.  Auth notification reference given as [id number].   Service date range good from 09/13/15 - 12/12/15.   Gap exception requested to determine if services can be considered at an in-network level.

## 2015-09-13 NOTE — Progress Notes (Signed)
[facility]  ROUNDING NOTE   Subjective:  Seen earlier today Sitting up in the bed Breathing has improved significantly 2000 c removed yesterday   HEMODIALYSIS FLOWSHEET:12/20  Blood Flow Rate (mL/min): 400 mL/min Arterial Pressure (mmHg): -150 mmHg Venous Pressure (mmHg): 150 mmHg Transmembrane Pressure (mmHg): 60 mmHg Ultrafiltration Rate (mL/min): 830 mL/min Dialysate Flow Rate (mL/min): 800 ml/min Conductivity: Machine : 14 Conductivity: Machine : 14 Dialysis Fluid Bolus: Normal Saline Bolus Amount (mL): 250 mL Intra-Hemodialysis Comments: 2552ml.Marland KitchenMarland KitchenMarland KitchenTx completed.     Objective:  Vital signs in last 24 hours:  Temp:  [97.5 F (36.4 C)-98.4 F (36.9 C)] 98.1 F (36.7 C) (12/21 1118) Pulse Rate:  [76-84] 76 (12/21 1118) Resp:  [18-27] 19 (12/21 1118) BP: (109-145)/(60-97) 121/67 mmHg (12/21 1118) SpO2:  [92 %-98 %] 93 % (12/21 1118) Weight:  [82 kg (180 lb 12.4 oz)-86.592 kg (190 lb 14.4 oz)] 86.592 kg (190 lb 14.4 oz) (12/20 2241)  Weight change: -4 kg (-8 lb 13.1 oz) Filed Weights   09/11/15 1715 09/12/15 2200 09/12/15 2241  Weight: 84 kg (185 lb 3 oz) 82 kg (180 lb 12.4 oz) 86.592 kg (190 lb 14.4 oz)    Intake/Output: I/O last 3 completed shifts: In: 273 [P.O.:270; I.V.:3] Out: 2000 [Other:2000]   Intake/Output this shift:  Total I/O In: 240 [P.O.:240] Out: 0   Physical Exam: General: NAD, sitting up in chair  Head: Normocephalic, atraumatic. Moist oral mucosal membranes  Eyes: Anicteric,    Neck: Supple, trachea midline  Lungs:   normal respiratory effort, mild basilar crackles  Heart: S1S2 no rubs  Abdomen:  Soft, nontender, bowel sounds present   Extremities:  + peripheral edema.  Neurologic: Nonfocal, moving all four extremities  Skin: No lesions  Access: LUE AVF, good thrill    Basic Metabolic Panel:  Recent Labs Lab 09/11/15 0624 09/11/15 1007 09/12/15 0405  NA 142  --  144  K 4.1  --  3.7  CL 105  --  104  CO2 27  --   32  GLUCOSE 152*  --  126*  BUN 59*  --  36*  CREATININE 4.10* 3.83* 2.83*  CALCIUM 8.6*  --  8.4*    Liver Function Tests:  Recent Labs Lab 09/11/15 0624  AST 14*  ALT 8*  ALKPHOS 76  BILITOT 0.7  PROT 6.4*  ALBUMIN 2.9*   No results for input(s): LIPASE, AMYLASE in the last 168 hours. No results for input(s): AMMONIA in the last 168 hours.  CBC:  Recent Labs Lab 09/11/15 0624 09/11/15 1007 09/12/15 0405  WBC 5.4 6.3 4.8  HGB 7.6* 7.2* 7.0*  HCT 24.8* 23.7* 22.1*  MCV 88.5 87.7 87.3  PLT 220 208 164    Cardiac Enzymes:  Recent Labs Lab 09/11/15 0624 09/11/15 1007 09/11/15 1809 09/11/15 2010  TROPONINI 0.04* 0.04* 0.04* 0.04*    BNP: Invalid input(s): POCBNP  CBG: No results for input(s): GLUCAP in the last 168 hours.  Microbiology: Results for orders placed or performed during the hospital encounter of 09/11/15  MRSA PCR Screening     Status: None   Collection Time: 09/11/15 11:30 AM  Result Value Ref Range Status   MRSA by PCR NEGATIVE NEGATIVE Final    Comment:        The GeneXpert MRSA Assay (FDA approved for NASAL specimens only), is one component of a comprehensive MRSA colonization surveillance program. It is not intended to diagnose MRSA infection nor to guide or monitor treatment for MRSA  infections.   C difficile quick scan w PCR reflex     Status: Abnormal   Collection Time: 09/12/15 12:40 PM  Result Value Ref Range Status   C Diff antigen POSITIVE (A) NEGATIVE Final   C Diff toxin NEGATIVE NEGATIVE Final   C Diff interpretation   Final    Negative for toxigenic C. difficile. Toxin gene and active toxin production not detected. May be a nontoxigenic strain of C. difficile bacteria present, lacking the ability to produce toxin.  Clostridium Difficile by PCR     Status: None   Collection Time: 09/12/15 12:40 PM  Result Value Ref Range Status   Toxigenic C Difficile by pcr NEGATIVE NEGATIVE Final    Coagulation Studies: No  results for input(s): LABPROT, INR in the last 72 hours.  Urinalysis: No results for input(s): COLORURINE, LABSPEC, PHURINE, GLUCOSEU, HGBUR, BILIRUBINUR, KETONESUR, PROTEINUR, UROBILINOGEN, NITRITE, LEUKOCYTESUR in the last 72 hours.  Invalid input(s): APPERANCEUR    Imaging: No results found.   Medications:       Assessment/ Plan:  74 y.o. male with end-stage renal disease ,  hx of small bowel obstruction in May 2016, GERD, COPD, bladder cancer, history of TURBT, anemia of CKD, secondary hyperparathyroidism, recurrent C. Diff colitis.   1. End-stage renal disease on hemodialysis- TTS-1/ [facility]/  Next HD as outpatient as discharge to [place] is anticipated  2. Acute Pulm Edema ( non cardiogenic) due to missed HD treatment - Volume overload with dialysis as tolerated  3. Secondary hyperparathyroidism. We will monitor phosphorus during hospitalization  4. Diarrhea. The patient has had recurrent diarrhea and has had bouts of C. difficile colitis in the past.  C. difficile toxin negative  5. Generalized weakness. The patient has been receiving physical therapy on an outpatient basis. The patient's family has previously reports that it has been difficult to take care of him. He may end up requiring placement in a facility.  6. Anemia of chronic kidney disease. Hemoglobin currently low at 7.0  resume Epogen with dialysis.    LOS: 2 [name] 12/21/201612:05 PM

## 2015-09-13 NOTE — Clinical Social Work Placement (Signed)
   CLINICAL SOCIAL WORK PLACEMENT  NOTE  Date:  09/13/2015  Patient Details  Name: [name] MRN: [id number] Date of Birth: 1941-06-07  Clinical Social Work is seeking post-discharge placement for this patient at the [place] level of care (*CSW will initial, date and re-position this form in  chart as items are completed):  Yes   Patient/family provided with [place] Work Department's list of facilities offering this level of care within the geographic area requested by the patient (or if unable, by the patient's family).  Yes   Patient/family informed of their freedom to choose among providers that offer the needed level of care, that participate in Medicare, Medicaid or managed care program needed by the patient, have an available bed and are willing to accept the patient.  Yes   Patient/family informed of [place]'s ownership interest in [facility] and [facility], as well as of the fact that they are under no obligation to receive care at these facilities.  PASRR submitted to EDS on       PASRR number received on       Existing PASRR number confirmed on 09/12/15     FL2 transmitted to all facilities in geographic area requested by pt/family on 09/12/15     FL2 transmitted to all facilities within larger geographic area on       Patient informed that his/her managed care company has contracts with or will negotiate with certain facilities, including the following:            Patient/family informed of bed offers received.  Patient chooses bed at  [facility])     Physician recommends and patient chooses bed at      Patient to be transferred to  [facility]) on 09/13/15.  Patient to be transferred to facility by  (EMS)     Patient family notified on 09/13/15 of transfer.  Name of family member notified:   [name] Cousin)     PHYSICIAN       Additional Comment:     _______________________________________________ Maurine Cane, LCSW 09/13/2015, 12:07 PM

## 2015-09-13 NOTE — Progress Notes (Signed)
Clinical Social Worker informed by Gladstone Lighter, MD that patient is medically ready to discharge to SNF, Patient and his cousin [name] are in a agreement with plan.  Call to [facility] to confirm that patient's bed is ready. Provided patient's room number 89 and number to call for report [phone] . All discharge information faxed to  Facility.   RN will call report and patient will discharge to [facility] via EMS.  [name], MSW Clinical Social Work Department [phone] 12:21 PM

## 2015-09-13 NOTE — Progress Notes (Signed)
Patient discharged to [facility]. IV was removed with no signs of infection. Dressing clean, dry intact. No skin tears or wounds present. Discharge paperwork was completed by Neoma Laming, CSW and sent with patient. Patient was transported via ems. No further needs from care management team.

## 2015-09-13 NOTE — Progress Notes (Signed)
Patient being transferred to [facility] Report given to [facility].

## 2015-09-13 NOTE — Discharge Summary (Signed)
[facility] [place] NAME: [name]    MR#:  [id number]  DATE OF BIRTH:  [birth year]  DATE OF ADMISSION:  09/11/2015 ADMITTING PHYSICIAN: Gladstone Lighter, MD  DATE OF DISCHARGE: 09/13/15  PRIMARY CARE PHYSICIAN: Ethan Body, MD    ADMISSION DIAGNOSIS:  Acute respiratory failure with hypoxia ([place]) [J96.01]  DISCHARGE DIAGNOSIS:  Active Problems:   Acute respiratory failure ([place])   SECONDARY DIAGNOSIS:   Past Medical History  Diagnosis Date  . PAF (paroxysmal atrial fibrillation) ([place])     a. new onset 12/2014 in the setting of UTI, sepsis, hypotension, and anemia; b. not on long term anticoagulation given anemia; c. family aware of stroke risk, they are ok with this; d. on amiodarone   . Chronic combined systolic and diastolic CHF (congestive heart failure) ([place])     a. echo 12/2014: EF 25-30%, anterior wall wall motion abnormalities; b. planned ischemic evaluation once patient is stable medically; c. echo 01/2015: EF 45-50%, no RWMA, mild AT, LA mildly dilated, mod pericardial effusion along LV free wall, no evidence of hemodynamic compromise  . ESRD on hemodialysis ([place])     a. Tuesday, Thursday, and Saturdays  . Anemia     a. baseline hgb ~ 8  . History of small bowel obstruction     a. 01/2015  . History of septic shock     a. 01/2015  . GERD (gastroesophageal reflux disease)   . Allergic rhinitis   . COPD (chronic obstructive pulmonary disease) ([place])   . Hypercholesteremia   . Cognitive communication deficit   . Magnesium deficiency   . Dementia   . 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 Incontinence   . Hydronephrosis   . Overactive bladder   . Detrusor sphincter dyssynergia   . Mini stroke ([place])   . Bladder cancer ([place])   . Prostate cancer ([place])   . Renal insufficiency   . Hypertension     HOSPITAL COURSE:   [name] is a 74 y.o. male with a known history of paroxysmal atrial fibrillation,  combined CHF EF 30%, ESRD on Tue-Thur-Sat, chronic anemia, Overactive bladder comes from home secondary to worsening shortness of breath.  #1 acute hypoxic respiratory failure-secondary to acute pulmonary edema. -Noncardiogenic pulmonary edema due to end-stage renal disease and missing dialysis -Improved after dialysis. Off BiPAP. On 2-3l o2- which is his chronic o2. -Appreciate nephrology consult  #2 Acute pulmonary edema- management as above Improved now  #3 Diarrhea- none now, but due to h/o c.diff in past Repeat c.diff pcr and toxin are negative, antigen positive but likely from prior history  #4 Anemia of chronic disease- epogen with dialysis - hb close to baseline - On iron supplements as well  #5 Wide complex rhythm - on admission, likely from his pulmonary edema. -continue amiodarone. - h/o Afib  #6 HTN- home meds  #7 ESRD on HD- Tue-Thur-Sat schedule - nephrology consulted -last dialysis yesterday, next dialysis tomorrow per schedule  Physical therapy consulted. Recommended rehabilitation. -Discharged to rehabilitation today  DISCHARGE CONDITIONS:   Stable  CONSULTS OBTAINED:  Treatment Team:  Murlean Iba, MD  DRUG ALLERGIES:   Allergies  Allergen Reactions  . Penicillins Hives, Itching and Swelling    Has patient had a PCN reaction causing immediate rash, facial/tongue/throat swelling, SOB or lightheadedness with hypotension: Yes Has patient had a PCN reaction causing severe rash involving mucus membranes or skin necrosis: No Has patient had a PCN reaction that required  hospitalization No Has patient had a PCN reaction occurring within the last 10 years: No If all of the above answers are "NO", then may proceed with Cephalosporin use.    DISCHARGE MEDICATIONS:   Current Discharge Medication List    CONTINUE these medications which have CHANGED   Details  amiodarone (PACERONE) 200 MG tablet Take 1 tablet (200 mg total) by mouth daily. Qty: 30  tablet, Refills: 5    atorvastatin (LIPITOR) 40 MG tablet Take 1 tablet (40 mg total) by mouth daily. Qty: 30 tablet, Refills: 1    ferrous sulfate 325 (65 FE) MG tablet Take 1 tablet (325 mg total) by mouth daily with breakfast. Qty: 90 tablet, Refills: 0    guaiFENesin (MUCINEX) 600 MG 12 hr tablet Take 1 tablet (600 mg total) by mouth 2 (two) times daily. Qty: 20 tablet, Refills: 2    magnesium oxide (MAG-OX) 400 MG tablet Take 1 tablet (400 mg total) by mouth every evening. Qty: 30 tablet, Refills: 2    Nutritional Supplements (FEEDING SUPPLEMENT, NEPRO CARB STEADY,) LIQD Take 237 mLs by mouth 2 (two) times daily between meals. Qty: 30 Can, Refills: 0    polyethylene glycol (MIRALAX / GLYCOLAX) packet Take 17 g by mouth daily. Qty: 14 each, Refills: 0    tiotropium (SPIRIVA) 18 MCG inhalation capsule Place 1 capsule (18 mcg total) into inhaler and inhale daily. Qty: 30 capsule, Refills: 12      CONTINUE these medications which have NOT CHANGED   Details  albuterol (PROVENTIL) (2.5 MG/3ML) 0.083% nebulizer solution Take 3 mLs (2.5 mg total) by nebulization every 4 (four) hours as needed for wheezing or shortness of breath. Qty: 75 mL, Refills: 12    amLODipine (NORVASC) 2.5 MG tablet Take 2.5 mg by mouth daily.    budesonide-formoterol (SYMBICORT) 160-4.5 MCG/ACT inhaler Inhale 2 puffs into the lungs 2 (two) times daily.    sevelamer carbonate (RENVELA) 800 MG tablet Take 800 mg by mouth 3 (three) times daily before meals.       STOP taking these medications     albuterol (PROVENTIL HFA;VENTOLIN HFA) 108 (90 BASE) MCG/ACT inhaler      ferrous gluconate (FERGON) 324 MG tablet      ipratropium-albuterol (DUONEB) 0.5-2.5 (3) MG/3ML SOLN          DISCHARGE INSTRUCTIONS:   1. PCP f/u in 1 week 2. For dialysis tomorrow 3. Physical Therapy  If you experience worsening of your admission symptoms, develop shortness of breath, life threatening emergency, suicidal or  homicidal thoughts you must seek medical attention immediately by calling 911 or calling your MD immediately  if symptoms less severe.  You Must read complete instructions/literature along with all the possible adverse reactions/side effects for all the Medicines you take and that have been prescribed to you. Take any new Medicines after you have completely understood and accept all the possible adverse reactions/side effects.   Please note  You were cared for by a hospitalist during your hospital stay. If you have any questions about your discharge medications or the care you received while you were in the hospital after you are discharged, you can call the unit and asked to speak with the hospitalist on call if the hospitalist that took care of you is not available. Once you are discharged, your primary care physician will handle any further medical issues. Please note that NO REFILLS for any discharge medications will be authorized once you are discharged, as it is imperative that you  return to your primary care physician (or establish a relationship with a primary care physician if you do not have one) for your aftercare needs so that they can reassess your need for medications and monitor your lab values.    Today   CHIEF COMPLAINT:   Chief Complaint  Patient presents with  . Respiratory Distress    VITAL SIGNS:  Blood pressure 121/67, pulse 76, temperature 98.1 F (36.7 C), temperature source Oral, resp. rate 19, height 6\' 2"  (1.88 m), weight 86.592 kg (190 lb 14.4 oz), SpO2 93 %.  I/O:   Intake/Output Summary (Last 24 hours) at 09/13/15 1138 Last data filed at 09/13/15 0900  Gross per 24 hour  Intake    483 ml  Output   2000 ml  Net  -1517 ml    PHYSICAL EXAMINATION:   Physical Exam  GENERAL: 74 y.o.-year-old patient sitting in bed, not in any distress. Off oxygen today.Marland Kitchen  EYES: Pupils equal, round, reactive to light and accommodation. No scleral icterus. Extraocular  muscles intact.  HEENT: Head atraumatic, normocephalic. Oropharynx and nasopharynx clear.  NECK: Supple, no jugular venous distention. No thyroid enlargement, no tenderness.  LUNGS: Moving air bilaterally, no crackles, no wheezing, rales,rhonchi or crepitation. Decreased bibasilar breath sounds noted.. No use of accessory muscles of respiration.  CARDIOVASCULAR: S1, S2 normal. No rubs, or gallops. 3/6 systolic murmur ABDOMEN: Soft, nontender, nondistended. Bowel sounds present. No organomegaly or mass.  EXTREMITIES: No cyanosis, or clubbing. 1+ pedal edema NEUROLOGIC: Cranial nerves II through XII are intact. Muscle strength 5/5 in all extremities. Sensation intact. Gait not checked.  PSYCHIATRIC: The patient is alert and oriented x 3.  SKIN: No obvious rash, lesion, or ulcer.   DATA REVIEW:   CBC  Recent Labs Lab 09/12/15 0405  WBC 4.8  HGB 7.0*  HCT 22.1*  PLT 164    Chemistries   Recent Labs Lab 09/11/15 0624  09/12/15 0405  NA 142  --  144  K 4.1  --  3.7  CL 105  --  104  CO2 27  --  32  GLUCOSE 152*  --  126*  BUN 59*  --  36*  CREATININE 4.10*  < > 2.83*  CALCIUM 8.6*  --  8.4*  AST 14*  --   --   ALT 8*  --   --   ALKPHOS 76  --   --   BILITOT 0.7  --   --   < > = values in this interval not displayed.  Cardiac Enzymes  Recent Labs Lab 09/11/15 2010  TROPONINI 0.04*    Microbiology Results  Results for orders placed or performed during the hospital encounter of 09/11/15  MRSA PCR Screening     Status: None   Collection Time: 09/11/15 11:30 AM  Result Value Ref Range Status   MRSA by PCR NEGATIVE NEGATIVE Final    Comment:        The GeneXpert MRSA Assay (FDA approved for NASAL specimens only), is one component of a comprehensive MRSA colonization surveillance program. It is not intended to diagnose MRSA infection nor to guide or monitor treatment for MRSA infections.   C difficile quick scan w PCR reflex     Status: Abnormal    Collection Time: 09/12/15 12:40 PM  Result Value Ref Range Status   C Diff antigen POSITIVE (A) NEGATIVE Final   C Diff toxin NEGATIVE NEGATIVE Final   C Diff interpretation   Final    Negative  for toxigenic C. difficile. Toxin gene and active toxin production not detected. May be a nontoxigenic strain of C. difficile bacteria present, lacking the ability to produce toxin.  Clostridium Difficile by PCR     Status: None   Collection Time: 09/12/15 12:40 PM  Result Value Ref Range Status   Toxigenic C Difficile by pcr NEGATIVE NEGATIVE Final    RADIOLOGY:  No results found.  EKG:   Orders placed or performed during the hospital encounter of 09/11/15  . ED EKG  . ED EKG      Management plans discussed with the patient, family and they are in agreement.  CODE STATUS:     Code Status Orders        Start     Ordered   09/11/15 0956  Full code   Continuous     09/11/15 0955    Advance Directive Documentation        Most Recent Value   Type of Advance Directive  Healthcare Power of Attorney, Living will   Pre-existing out of facility DNR order (yellow form or pink MOST form)     "MOST" Form in Place?        TOTAL TIME TAKING CARE OF THIS PATIENT: 37 minutes.    Gladstone Lighter M.D on 09/13/2015 at 11:38 AM  Between 7am to 6pm - Pager - [phone]  After 6pm go to [email or web address] - password [email or web address] [facility]  Office  [phone]  CC: Primary care physician; Ethan Body, MD

## 2015-09-13 NOTE — Care Management Important Message (Signed)
Important Message  Patient Details  Name: [name] MRN: [id number] Date of Birth: 1941/01/20   Medicare Important Message Given:  Yes    Juliann Pulse A Derrisha Foos 09/13/2015, 1:52 PM

## 2015-09-16 LAB — BLOOD GAS, VENOUS
Acid-Base Excess: 7 mmol/L — ABNORMAL HIGH (ref 0.0–3.0)
Bicarbonate: 33.4 mEq/L — ABNORMAL HIGH (ref 21.0–28.0)
PCO2 VEN: 54 mmHg (ref 44.0–60.0)
PH VEN: 7.4 (ref 7.320–7.430)
Patient temperature: 37

## 2015-09-19 ENCOUNTER — Encounter: Payer: Medicare Other | Admitting: Physical Therapy

## 2015-09-19 LAB — GLUCOSE, CAPILLARY: Glucose-Capillary: 78 mg/dL (ref 65–99)

## 2015-09-20 ENCOUNTER — Encounter: Payer: Medicare Other | Admitting: Physical Therapy

## 2015-09-21 ENCOUNTER — Encounter: Payer: Medicare Other | Admitting: Physical Therapy

## 2015-09-22 ENCOUNTER — Ambulatory Visit: Payer: Medicare Other | Admitting: Family

## 2015-09-27 ENCOUNTER — Encounter: Payer: Medicare Other | Admitting: Physical Therapy

## 2015-10-02 ENCOUNTER — Encounter: Payer: Medicare Other | Admitting: Physical Therapy

## 2015-10-04 ENCOUNTER — Encounter: Payer: Medicare Other | Admitting: Physical Therapy

## 2015-10-09 ENCOUNTER — Encounter: Payer: Medicare Other | Admitting: Physical Therapy

## 2015-10-10 ENCOUNTER — Other Ambulatory Visit
Admission: RE | Admit: 2015-10-10 | Discharge: 2015-10-10 | Disposition: A | Discharge status: Home / Self Care | Payer: Medicare Other | Source: Ambulatory Visit | Attending: Nephrology | Admitting: Nephrology

## 2015-10-10 DIAGNOSIS — D631 Anemia in chronic kidney disease: Secondary | ICD-10-CM | POA: Insufficient documentation

## 2015-10-10 LAB — HEMOGLOBIN: Hemoglobin: 7.3 g/dL — ABNORMAL LOW (ref 13.0–18.0)

## 2015-10-11 ENCOUNTER — Encounter: Payer: Medicare Other | Admitting: Physical Therapy

## 2015-10-13 ENCOUNTER — Encounter: Payer: Self-pay | Admitting: Urology

## 2015-10-13 ENCOUNTER — Other Ambulatory Visit: Payer: Medicare Other | Admitting: Urology

## 2015-10-16 ENCOUNTER — Encounter: Payer: Medicare Other | Admitting: Physical Therapy

## 2015-10-18 ENCOUNTER — Encounter: Payer: Medicare Other | Admitting: Physical Therapy

## 2015-10-19 ENCOUNTER — Emergency Department: Payer: Medicare Other

## 2015-10-19 ENCOUNTER — Inpatient Hospital Stay
Admission: EM | Admit: 2015-10-19 | Discharge: 2015-10-20 | DRG: 291 | Disposition: A | Discharge status: Skilled Nursing Facility | Payer: Medicare Other | Attending: Internal Medicine | Admitting: Internal Medicine

## 2015-10-19 ENCOUNTER — Encounter: Payer: Self-pay | Admitting: Emergency Medicine

## 2015-10-19 DIAGNOSIS — Z79899 Other long term (current) drug therapy: Secondary | ICD-10-CM | POA: Diagnosis not present

## 2015-10-19 DIAGNOSIS — D631 Anemia in chronic kidney disease: Secondary | ICD-10-CM | POA: Diagnosis present

## 2015-10-19 DIAGNOSIS — E7800 Pure hypercholesterolemia, unspecified: Secondary | ICD-10-CM | POA: Diagnosis present

## 2015-10-19 DIAGNOSIS — Z9889 Other specified postprocedural states: Secondary | ICD-10-CM

## 2015-10-19 DIAGNOSIS — Z8551 Personal history of malignant neoplasm of bladder: Secondary | ICD-10-CM

## 2015-10-19 DIAGNOSIS — I10 Essential (primary) hypertension: Secondary | ICD-10-CM | POA: Diagnosis present

## 2015-10-19 DIAGNOSIS — Z880 Allergy status to penicillin: Secondary | ICD-10-CM | POA: Diagnosis not present

## 2015-10-19 DIAGNOSIS — I5032 Chronic diastolic (congestive) heart failure: Secondary | ICD-10-CM | POA: Diagnosis present

## 2015-10-19 DIAGNOSIS — R0682 Tachypnea, not elsewhere classified: Secondary | ICD-10-CM | POA: Diagnosis present

## 2015-10-19 DIAGNOSIS — Z992 Dependence on renal dialysis: Secondary | ICD-10-CM | POA: Diagnosis not present

## 2015-10-19 DIAGNOSIS — I480 Paroxysmal atrial fibrillation: Secondary | ICD-10-CM | POA: Diagnosis present

## 2015-10-19 DIAGNOSIS — N2581 Secondary hyperparathyroidism of renal origin: Secondary | ICD-10-CM | POA: Diagnosis present

## 2015-10-19 DIAGNOSIS — J449 Chronic obstructive pulmonary disease, unspecified: Secondary | ICD-10-CM | POA: Diagnosis present

## 2015-10-19 DIAGNOSIS — N186 End stage renal disease: Secondary | ICD-10-CM | POA: Diagnosis present

## 2015-10-19 DIAGNOSIS — I132 Hypertensive heart and chronic kidney disease with heart failure and with stage 5 chronic kidney disease, or end stage renal disease: Principal | ICD-10-CM | POA: Diagnosis present

## 2015-10-19 DIAGNOSIS — K219 Gastro-esophageal reflux disease without esophagitis: Secondary | ICD-10-CM | POA: Diagnosis present

## 2015-10-19 DIAGNOSIS — Z8546 Personal history of malignant neoplasm of prostate: Secondary | ICD-10-CM | POA: Diagnosis not present

## 2015-10-19 DIAGNOSIS — F0390 Unspecified dementia without behavioral disturbance: Secondary | ICD-10-CM | POA: Diagnosis present

## 2015-10-19 DIAGNOSIS — I5023 Acute on chronic systolic (congestive) heart failure: Secondary | ICD-10-CM | POA: Diagnosis present

## 2015-10-19 DIAGNOSIS — J9601 Acute respiratory failure with hypoxia: Secondary | ICD-10-CM | POA: Diagnosis present

## 2015-10-19 DIAGNOSIS — Z823 Family history of stroke: Secondary | ICD-10-CM

## 2015-10-19 DIAGNOSIS — Z87891 Personal history of nicotine dependence: Secondary | ICD-10-CM | POA: Diagnosis not present

## 2015-10-19 DIAGNOSIS — Z8673 Personal history of transient ischemic attack (TIA), and cerebral infarction without residual deficits: Secondary | ICD-10-CM | POA: Diagnosis not present

## 2015-10-19 DIAGNOSIS — J9691 Respiratory failure, unspecified with hypoxia: Secondary | ICD-10-CM | POA: Diagnosis present

## 2015-10-19 LAB — RENAL FUNCTION PANEL
ANION GAP: 10 (ref 5–15)
Albumin: 2.5 g/dL — ABNORMAL LOW (ref 3.5–5.0)
BUN: 62 mg/dL — ABNORMAL HIGH (ref 6–20)
CALCIUM: 8.3 mg/dL — AB (ref 8.9–10.3)
CO2: 28 mmol/L (ref 22–32)
Chloride: 97 mmol/L — ABNORMAL LOW (ref 101–111)
Creatinine, Ser: 3.46 mg/dL — ABNORMAL HIGH (ref 0.61–1.24)
GFR calc Af Amer: 19 mL/min — ABNORMAL LOW (ref >60)
GFR calc non Af Amer: 16 mL/min — ABNORMAL LOW (ref >60)
GLUCOSE: 87 mg/dL (ref 65–99)
POTASSIUM: 3.9 mmol/L (ref 3.5–5.1)
Phosphorus: 2.5 mg/dL (ref 2.5–4.6)
SODIUM: 135 mmol/L (ref 135–145)

## 2015-10-19 LAB — BASIC METABOLIC PANEL WITH GFR
Anion gap: 13 (ref 5–15)
BUN: 54 mg/dL — ABNORMAL HIGH (ref 6–20)
CO2: 27 mmol/L (ref 22–32)
Calcium: 8.4 mg/dL — ABNORMAL LOW (ref 8.9–10.3)
Chloride: 98 mmol/L — ABNORMAL LOW (ref 101–111)
Creatinine, Ser: 3.44 mg/dL — ABNORMAL HIGH (ref 0.61–1.24)
GFR calc Af Amer: 19 mL/min — ABNORMAL LOW
GFR calc non Af Amer: 16 mL/min — ABNORMAL LOW
Glucose, Bld: 104 mg/dL — ABNORMAL HIGH (ref 65–99)
Potassium: 4.1 mmol/L (ref 3.5–5.1)
Sodium: 138 mmol/L (ref 135–145)

## 2015-10-19 LAB — TROPONIN I: Troponin I: 0.04 ng/mL — ABNORMAL HIGH

## 2015-10-19 LAB — HEMOGLOBIN A1C: Hgb A1c MFr Bld: UNDETERMINED % (ref 4.0–6.0)

## 2015-10-19 LAB — CBC
HEMATOCRIT: 24.7 % — AB (ref 40.0–52.0)
Hemoglobin: 7.7 g/dL — ABNORMAL LOW (ref 13.0–18.0)
MCH: 26.8 pg (ref 26.0–34.0)
MCHC: 31.2 g/dL — ABNORMAL LOW (ref 32.0–36.0)
MCV: 86.1 fL (ref 80.0–100.0)
PLATELETS: 205 10*3/uL (ref 150–440)
RBC: 2.87 MIL/uL — ABNORMAL LOW (ref 4.40–5.90)
RDW: 16.6 % — AB (ref 11.5–14.5)
WBC: 8.6 10*3/uL (ref 3.8–10.6)

## 2015-10-19 LAB — TSH: TSH: 1.591 u[IU]/mL (ref 0.350–4.500)

## 2015-10-19 LAB — GLUCOSE, CAPILLARY: Glucose-Capillary: 110 mg/dL — ABNORMAL HIGH (ref 65–99)

## 2015-10-19 MED ORDER — FERROUS SULFATE 325 (65 FE) MG PO TABS
325.0000 mg | ORAL_TABLET | Freq: Every day | ORAL | Status: DC
  Administered 2015-10-20: 325 mg via ORAL
  Filled 2015-10-19: qty 1

## 2015-10-19 MED ORDER — NEPRO/CARBSTEADY PO LIQD: 237.0000 mL | Freq: Two times a day (BID) | ORAL | Status: DC

## 2015-10-19 MED ORDER — ONDANSETRON HCL 4 MG/2ML IJ SOLN: 4.0000 mg | Freq: Four times a day (QID) | INTRAMUSCULAR | Status: DC | PRN

## 2015-10-19 MED ORDER — HEPARIN SODIUM (PORCINE) 5000 UNIT/ML IJ SOLN
5000.0000 [IU] | Freq: Three times a day (TID) | INTRAMUSCULAR | Status: DC
  Administered 2015-10-19 – 2015-10-20 (×4): 5000 [IU] via SUBCUTANEOUS
  Filled 2015-10-19 (×4): qty 1

## 2015-10-19 MED ORDER — ATORVASTATIN CALCIUM 20 MG PO TABS
40.0000 mg | ORAL_TABLET | Freq: Every day | ORAL | Status: DC
  Administered 2015-10-20: 40 mg via ORAL
  Filled 2015-10-19 (×2): qty 2

## 2015-10-19 MED ORDER — BUDESONIDE-FORMOTEROL FUMARATE 160-4.5 MCG/ACT IN AERO
2.0000 | INHALATION_SPRAY | Freq: Two times a day (BID) | RESPIRATORY_TRACT | Status: DC
  Administered 2015-10-19 – 2015-10-20 (×2): 2 via RESPIRATORY_TRACT
  Filled 2015-10-19: qty 6

## 2015-10-19 MED ORDER — FUROSEMIDE 10 MG/ML IJ SOLN
100.0000 mg | Freq: Once | INTRAVENOUS | Status: AC
  Administered 2015-10-19: 100 mg via INTRAVENOUS
  Filled 2015-10-19: qty 10

## 2015-10-19 MED ORDER — MAGNESIUM OXIDE 400 (241.3 MG) MG PO TABS
400.0000 mg | ORAL_TABLET | Freq: Every evening | ORAL | Status: DC
  Administered 2015-10-19: 400 mg via ORAL
  Filled 2015-10-19: qty 1

## 2015-10-19 MED ORDER — SEVELAMER CARBONATE 800 MG PO TABS
1600.0000 mg | ORAL_TABLET | Freq: Three times a day (TID) | ORAL | Status: DC
  Administered 2015-10-20 (×2): 1600 mg via ORAL
  Filled 2015-10-19 (×3): qty 2

## 2015-10-19 MED ORDER — SEVELAMER CARBONATE 800 MG PO TABS: 800.0000 mg | ORAL_TABLET | Freq: Three times a day (TID) | ORAL | Status: DC

## 2015-10-19 MED ORDER — ONDANSETRON HCL 4 MG PO TABS: 4.0000 mg | ORAL_TABLET | Freq: Four times a day (QID) | ORAL | Status: DC | PRN

## 2015-10-19 MED ORDER — ACETAMINOPHEN 650 MG RE SUPP: 650.0000 mg | Freq: Four times a day (QID) | RECTAL | Status: DC | PRN

## 2015-10-19 MED ORDER — SODIUM CHLORIDE 0.9% FLUSH
3.0000 mL | Freq: Two times a day (BID) | INTRAVENOUS | Status: DC
  Administered 2015-10-19 – 2015-10-20 (×3): 3 mL via INTRAVENOUS

## 2015-10-19 MED ORDER — MORPHINE SULFATE (PF) 2 MG/ML IV SOLN: 1.0000 mg | INTRAVENOUS | Status: DC | PRN

## 2015-10-19 MED ORDER — ALBUTEROL SULFATE (2.5 MG/3ML) 0.083% IN NEBU: 2.5000 mg | INHALATION_SOLUTION | RESPIRATORY_TRACT | Status: DC | PRN

## 2015-10-19 MED ORDER — POLYETHYLENE GLYCOL 3350 17 G PO PACK
17.0000 g | PACK | Freq: Every day | ORAL | Status: DC
  Administered 2015-10-20: 17 g via ORAL
  Filled 2015-10-19 (×2): qty 1

## 2015-10-19 MED ORDER — ACETAMINOPHEN 325 MG PO TABS: 650.0000 mg | ORAL_TABLET | Freq: Four times a day (QID) | ORAL | Status: DC | PRN

## 2015-10-19 MED ORDER — AMLODIPINE BESYLATE 5 MG PO TABS
2.5000 mg | ORAL_TABLET | Freq: Every day | ORAL | Status: DC
  Administered 2015-10-20: 2.5 mg via ORAL
  Filled 2015-10-19: qty 1

## 2015-10-19 MED ORDER — TIOTROPIUM BROMIDE MONOHYDRATE 18 MCG IN CAPS
18.0000 ug | ORAL_CAPSULE | Freq: Every day | RESPIRATORY_TRACT | Status: DC
  Administered 2015-10-20: 18 ug via RESPIRATORY_TRACT
  Filled 2015-10-19: qty 5

## 2015-10-19 MED ORDER — DOCUSATE SODIUM 100 MG PO CAPS
100.0000 mg | ORAL_CAPSULE | Freq: Two times a day (BID) | ORAL | Status: DC
  Administered 2015-10-20: 100 mg via ORAL
  Filled 2015-10-19 (×2): qty 1

## 2015-10-19 MED ORDER — AMIODARONE HCL 200 MG PO TABS
200.0000 mg | ORAL_TABLET | Freq: Every day | ORAL | Status: DC
  Administered 2015-10-20: 200 mg via ORAL
  Filled 2015-10-19 (×2): qty 1

## 2015-10-19 MED ORDER — GUAIFENESIN ER 600 MG PO TB12
600.0000 mg | ORAL_TABLET | Freq: Two times a day (BID) | ORAL | Status: DC
  Administered 2015-10-19 – 2015-10-20 (×2): 600 mg via ORAL
  Filled 2015-10-19 (×3): qty 1

## 2015-10-19 NOTE — Progress Notes (Signed)
Post HD Tx Assessment  

## 2015-10-19 NOTE — ED Notes (Addendum)
Pt presents to ED via [facility] from [facility] c/o difficulty breathing x 2 days, reports increased difficulty breathing this morning. Per EMS pt was supposed to have and extra day of dialysis yesterday to remove fluid but reports was unable to get dialysis due to transportation difficulty. Pt normal dialysis days are Tuesday, Thursday, and Saturday. Pt has hx of chronic respiratory failure. Pt is on continuous 2L O2  Nasal cannula, upon EMS arrival to facility pt O2 sat was 91%. EMS increased oxygen to 3L, pt at 96% saturation. Pt appears to have increase work in breathing. Pt speaking in short sentences. Pt reports he does not make urine. Pt alert and oriented x 4, denies chest pain, abdominal pain, N/V/D. Skin warm and dry. MD at bedside.

## 2015-10-19 NOTE — ED Notes (Signed)
Pt had bowel movement, pt cleaned with wipes. Pt tolerated rolling well.

## 2015-10-19 NOTE — ED Provider Notes (Signed)
[facility] Emergency Department Provider Note  ____________________________________________  Time seen: 4:25 AM  I have reviewed the triage vital signs and the nursing notes.   HISTORY  Chief Complaint Shortness of Breath      HPI [name] is a 75 y.o. male presents via [place] from [facility] with difficulty breathing 2 days. Patient admits to progressive dyspnea over the past 2 days with considerable worsening this morning. Patient states that he was supposed receive an extra dialysis session yesterday for "fluid removal". Patient states on their arrival oxygen saturation was 91% on the patient's baseline of 2 L nasal cannula of oxygen. Patient presents to the emergency department speaking in short sentences with obvious respiratory distress.     Past Medical History  Diagnosis Date  . PAF (paroxysmal atrial fibrillation) ([place])     a. new onset 12/2014 in the setting of UTI, sepsis, hypotension, and anemia; b. not on long term anticoagulation given anemia; c. family aware of stroke risk, they are ok with this; d. on amiodarone   . Chronic combined systolic and diastolic CHF (congestive heart failure) ([place])     a. echo 12/2014: EF 25-30%, anterior wall wall motion abnormalities; b. planned ischemic evaluation once patient is stable medically; c. echo 01/2015: EF 45-50%, no RWMA, mild AT, LA mildly dilated, mod pericardial effusion along LV free wall, no evidence of hemodynamic compromise  . ESRD on hemodialysis ([place])     a. Tuesday, Thursday, and Saturdays  . Anemia     a. baseline hgb ~ 8  . History of small bowel obstruction     a. 01/2015  . History of septic shock     a. 01/2015  . GERD (gastroesophageal reflux disease)   . Allergic rhinitis   . COPD (chronic obstructive pulmonary disease) ([place])   . Hypercholesteremia   . Cognitive communication deficit   . Magnesium deficiency   . Dementia   . 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 Incontinence   . Hydronephrosis   . Overactive bladder   . Detrusor sphincter dyssynergia   . Mini stroke ([place])   . Bladder cancer ([place])   . Prostate cancer ([place])   . Renal insufficiency   . Hypertension     Patient Active Problem List   Diagnosis Date Noted  . Acute respiratory failure ([place]) 09/11/2015  . Chronic diastolic heart failure ([place]) 08/11/2015  . Fluid overload 07/23/2015  . Generalized weakness 05/08/2015  . Other emphysema ([place]) 05/08/2015  . Tobacco use disorder 05/08/2015  . Hypokalemia 05/08/2015  . Pressure ulcer 05/05/2015  . Symptomatic anemia 05/04/2015  . CAFL (chronic airflow limitation) ([place]) 04/10/2015  . Malnutrition of moderate degree ([place]) 03/22/2015  . Pneumonia 03/20/2015  . History of sepsis 02/27/2015  . Incontinence 02/27/2015  . Persistent atrial fibrillation ([place]) 02/27/2015  . Preoperative cardiovascular examination 02/27/2015  . Chronic combined systolic and diastolic CHF (congestive heart failure) ([place])   . ESRD on hemodialysis ([place])   . Anemia   . History of small bowel obstruction   . Atrial fibrillation with RVR ([place]) 01/21/2015  . Chronic kidney disease (CKD), stage IV (severe) ([place]) 03/01/2014  . Essential (primary) hypertension 03/01/2014  . 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 Cystitis 11/11/2012  . Hydronephrosis 08/27/2012  . Bladder cancer ([place]) 08/27/2012  . CA of prostate ([place]) 08/27/2012  . Bone metastases ([place]) 08/27/2012    Past Surgical History  Procedure Laterality Date  . Cystoscopy w/ ureteral stent  placement    . Transurethral resection of bladder tumor with gyrus (turbt-gyrus)    . Ureteral stent placement    . Prostate surgery      Removal  . Av fistula placement      left arm    Current Outpatient Rx  Name  Route  Sig  Dispense  Refill  . albuterol (PROVENTIL) (2.5 MG/3ML) 0.083% nebulizer solution   Nebulization   Take 3 mLs (2.5 mg total) by nebulization every 4 (four) hours as needed for  wheezing or shortness of breath.   75 mL   12   . amiodarone (PACERONE) 200 MG tablet   Oral   Take 1 tablet (200 mg total) by mouth daily.   30 tablet   5   . amLODipine (NORVASC) 2.5 MG tablet   Oral   Take 2.5 mg by mouth daily.         Marland Kitchen atorvastatin (LIPITOR) 40 MG tablet   Oral   Take 1 tablet (40 mg total) by mouth daily.   30 tablet   1   . budesonide-formoterol (SYMBICORT) 160-4.5 MCG/ACT inhaler   Inhalation   Inhale 2 puffs into the lungs 2 (two) times daily.         . ferrous sulfate 325 (65 FE) MG tablet   Oral   Take 1 tablet (325 mg total) by mouth daily with breakfast.   90 tablet   0   . guaiFENesin (MUCINEX) 600 MG 12 hr tablet   Oral   Take 1 tablet (600 mg total) by mouth 2 (two) times daily.   20 tablet   2   . magnesium oxide (MAG-OX) 400 MG tablet   Oral   Take 1 tablet (400 mg total) by mouth every evening.   30 tablet   2   . Nutritional Supplements (FEEDING SUPPLEMENT, NEPRO CARB STEADY,) LIQD   Oral   Take 237 mLs by mouth 2 (two) times daily between meals.   30 Can   0   . polyethylene glycol (MIRALAX / GLYCOLAX) packet   Oral   Take 17 g by mouth daily.   14 each   0   . sevelamer carbonate (RENVELA) 800 MG tablet   Oral   Take 800 mg by mouth 3 (three) times daily before meals.          . tiotropium (SPIRIVA) 18 MCG inhalation capsule   Inhalation   Place 1 capsule (18 mcg total) into inhaler and inhale daily.   30 capsule   12     Allergies Penicillins  Family History  Problem Relation Age of Onset  . Stroke Mother     Social History Social History  Substance Use Topics  . Smoking status: Former Smoker -- 0.25 packs/day for 59 years    Quit date: 05/13/2015  . Smokeless tobacco: Never Used  . Alcohol Use: No    Review of Systems  Constitutional: Negative for fever. Eyes: Negative for visual changes. ENT: Negative for sore throat. Cardiovascular: Negative for chest pain. Respiratory: Positive  for shortness of breath Gastrointestinal: Negative for abdominal pain, vomiting and diarrhea. Genitourinary: Negative for dysuria. Musculoskeletal: Negative for back pain. Skin: Negative for rash. Neurological: Negative for headaches, focal weakness or numbness.   10-point ROS otherwise negative.  ____________________________________________   PHYSICAL EXAM:  VITAL SIGNS: ED Triage Vitals  Enc Vitals Group     BP 10/19/15 0424 136/86 mmHg     Pulse Rate 10/19/15 0424 85  Resp 10/19/15 0424 24     Temp 10/19/15 0424 98.7 F (37.1 C)     Temp src --      SpO2 10/19/15 0406 91 %     Weight 10/19/15 0424 208 lb (94.348 kg)     Height 10/19/15 0424 6\' 2"  (1.88 m)     Head Cir --      Peak Flow --      Pain Score --      Pain Loc --      Pain Edu? --      Excl. in [place]? --      Constitutional: Alert and oriented. Well appearing and in no distress. Eyes: Conjunctivae are normal. PERRL. Normal extraocular movements. ENT   Head: Normocephalic and atraumatic.   Nose: No congestion/rhinnorhea.   Mouth/Throat: Mucous membranes are moist.   Neck: No stridor. Hematological/Lymphatic/Immunilogical: No cervical lymphadenopathy. Cardiovascular: Normal rate, regular rhythm. Normal and symmetric distal pulses are present in all extremities. No murmurs, rubs, or gallops. Respiratory: Tachypnea, accessory respiratory muscle use, speaking in 3 word phrases. Gastrointestinal: Soft and nontender. No distention. There is no CVA tenderness. Genitourinary: deferred Musculoskeletal: Nontender with normal range of motion in all extremities. No joint effusions.  No lower extremity tenderness nor edema. Neurologic:  Normal speech and language. No gross focal neurologic deficits are appreciated. Speech is normal.  Skin:  Skin is warm, dry and intact. No rash noted. Psychiatric: Mood and affect are normal. Speech and behavior are normal. Patient exhibits appropriate insight and  judgment.  ____________________________________________    LABS (pertinent positives/negatives)  Labs Reviewed  BASIC METABOLIC PANEL - Abnormal; Notable for the following:    Chloride 98 (*)    Glucose, Bld 104 (*)    BUN 54 (*)    Creatinine, Ser 3.44 (*)    Calcium 8.4 (*)    GFR calc non Af Amer 16 (*)    GFR calc Af Amer 19 (*)    All other components within normal limits  CBC - Abnormal; Notable for the following:    RBC 2.87 (*)    Hemoglobin 7.7 (*)    HCT 24.7 (*)    MCHC 31.2 (*)    RDW 16.6 (*)    All other components within normal limits  TROPONIN I - Abnormal; Notable for the following:    Troponin I 0.04 (*)    All other components within normal limits     ____________________________________________   EKG  ED ECG REPORT I, BROWN, [place] N, the attending physician, personally viewed and interpreted this ECG.   Date: 10/19/2015  EKG Time: 4:25 AM  Rate: 86  Rhythm: Normal sinus rhythm  Axis: None  Intervals: Normal  ST&T Change: none     RADIOLOGY DG Chest Port 1 View (Final result) Result time: 10/19/15 04:27:59   Final result by Rad Results In Interface (10/19/15 04:27:59)   Narrative:   CLINICAL DATA: Acute onset of shortness of breath. Initial encounter.  EXAM: PORTABLE CHEST 1 VIEW  COMPARISON: Chest radiograph performed 09/11/2015  FINDINGS: The lungs are well-aerated. Bibasilar airspace opacities raise concern for pneumonia. Pulmonary edema could have a similar appearance. Underlying vascular congestion is noted. Small bilateral pleural effusions are suspected. No pneumothorax is seen.  The cardiomediastinal silhouette is mildly enlarged. No acute osseous abnormalities are seen.  IMPRESSION: Bibasilar airspace opacities raise concern for pneumonia. Pulmonary edema could have a similar appearance. Underlying vascular congestion and mild cardiomegaly. Small bilateral pleural effusions suspected.   Electronically  Signed By: Garald Balding M.D. On: 10/19/2015 04:27           Critical Care performed: CRITICAL CARE Performed by: Marjean Donna N   Total critical care time: 30 minutes  Critical care time was exclusive of separately billable procedures and treating other patients.  Critical care was necessary to treat or prevent imminent or life-threatening deterioration.  Critical care was time spent personally by me on the following activities: development of treatment plan with patient and/or surrogate as well as nursing, discussions with consultants, evaluation of patient's response to treatment, examination of patient, obtaining history from patient or surrogate, ordering and performing treatments and interventions, ordering and review of laboratory studies, ordering and review of radiographic studies, pulse oximetry and re-evaluation of patient's condition.   ____________________________________________   INITIAL IMPRESSION / ASSESSMENT AND PLAN / ED COURSE  Pertinent labs & imaging results that were available during my care of the patient were reviewed by me and considered in my medical decision making (see chart for details).  BiPAP applied secondary to rest or distress. Patient's symptoms are improved following this intervention namely decreased accessory muscle use. Patient discussed with Dr. Marcille Blanco hospitalist on-call for hospital admission for further evaluation and management  ____________________________________________   FINAL CLINICAL IMPRESSION(S) / ED DIAGNOSES  Final diagnoses:  Acute respiratory failure with hypoxia [facility])      Gregor Hams, MD 10/19/15 [phone]

## 2015-10-19 NOTE — Progress Notes (Signed)
[facility]  ROUNDING NOTE   Subjective:   Missed dialysis Saturday and Tuesday. Admitted with shortness of breath and dyspneia. Placed on Bipap. Patient was also rescheduled for an extra treatment yesterday, however he missed this.   Furosemide gtt started, however patient does not make urine  Objective:  Vital signs in last 24 hours:  Temp:  [98.7 F (37.1 C)] 98.7 F (37.1 C) (01/26 0424) Pulse Rate:  [80-89] 80 (01/26 0710) Resp:  [19-30] 22 (01/26 0900) BP: (120-161)/(70-88) 120/70 mmHg (01/26 0900) SpO2:  [91 %-100 %] 99 % (01/26 0710) FiO2 (%):  [30 %] 30 % (01/26 0800) Weight:  [94.348 kg (208 lb)] 94.348 kg (208 lb) (01/26 0424)  Weight change:  Filed Weights   10/19/15 0424  Weight: 94.348 kg (208 lb)    Intake/Output:     Intake/Output this shift:     Physical Exam: General: Critically ill  Head: Normocephalic, atraumatic.   Eyes: Anicteric, PERRL  Neck: Supple, trachea midline  Lungs:  Bilateral crackles, +BIPAP  Heart: Regular rate and rhythm  Abdomen:  Soft, nontender  Extremities: 1+ peripheral edema.  Neurologic: Nonfocal, moving all four extremities  Skin: No lesions  Access: Left arm AVF    Basic Metabolic Panel:  Recent Labs Lab 10/19/15 0411  NA 138  K 4.1  CL 98*  CO2 27  GLUCOSE 104*  BUN 54*  CREATININE 3.44*  CALCIUM 8.4*    Liver Function Tests: No results for input(s): AST, ALT, ALKPHOS, BILITOT, PROT, ALBUMIN in the last 168 hours. No results for input(s): LIPASE, AMYLASE in the last 168 hours. No results for input(s): AMMONIA in the last 168 hours.  CBC:  Recent Labs Lab 10/19/15 0411  WBC 8.6  HGB 7.7*  HCT 24.7*  MCV 86.1  PLT 205    Cardiac Enzymes:  Recent Labs Lab 10/19/15 0411  TROPONINI 0.04*    BNP: Invalid input(s): POCBNP  CBG:  Recent Labs Lab 10/19/15 0825  GLUCAP 110*    Microbiology: Results for orders placed or performed during the hospital encounter of 09/11/15   MRSA PCR Screening     Status: None   Collection Time: 09/11/15 11:30 AM  Result Value Ref Range Status   MRSA by PCR NEGATIVE NEGATIVE Final    Comment:        The GeneXpert MRSA Assay (FDA approved for NASAL specimens only), is one component of a comprehensive MRSA colonization surveillance program. It is not intended to diagnose MRSA infection nor to guide or monitor treatment for MRSA infections.   C difficile quick scan w PCR reflex     Status: Abnormal   Collection Time: 09/12/15 12:40 PM  Result Value Ref Range Status   C Diff antigen POSITIVE (A) NEGATIVE Final   C Diff toxin NEGATIVE NEGATIVE Final   C Diff interpretation   Final    Negative for toxigenic C. difficile. Toxin gene and active toxin production not detected. May be a nontoxigenic strain of C. difficile bacteria present, lacking the ability to produce toxin.  Clostridium Difficile by PCR     Status: None   Collection Time: 09/12/15 12:40 PM  Result Value Ref Range Status   Toxigenic C Difficile by pcr NEGATIVE NEGATIVE Final    Coagulation Studies: No results for input(s): LABPROT, INR in the last 72 hours.  Urinalysis: No results for input(s): COLORURINE, LABSPEC, PHURINE, GLUCOSEU, HGBUR, BILIRUBINUR, KETONESUR, PROTEINUR, UROBILINOGEN, NITRITE, LEUKOCYTESUR in the last 72 hours.  Invalid input(s): APPERANCEUR  Imaging: Dg Chest Port 1 View  10/19/2015  CLINICAL DATA:  Acute onset of shortness of breath. Initial encounter. EXAM: PORTABLE CHEST 1 VIEW COMPARISON:  Chest radiograph performed 09/11/2015 FINDINGS: The lungs are well-aerated. Bibasilar airspace opacities raise concern for pneumonia. Pulmonary edema could have a similar appearance. Underlying vascular congestion is noted. Small bilateral pleural effusions are suspected. No pneumothorax is seen. The cardiomediastinal silhouette is mildly enlarged. No acute osseous abnormalities are seen. IMPRESSION: Bibasilar airspace opacities raise concern  for pneumonia. Pulmonary edema could have a similar appearance. Underlying vascular congestion and mild cardiomegaly. Small bilateral pleural effusions suspected. Electronically Signed   By: Garald Balding M.D.   On: 10/19/2015 04:27     Medications:     . amiodarone  200 mg Oral Daily  . amLODipine  2.5 mg Oral Daily  . atorvastatin  40 mg Oral Daily  . budesonide-formoterol  2 puff Inhalation BID  . docusate sodium  100 mg Oral BID  . feeding supplement (NEPRO CARB STEADY)  237 mL Oral BID BM  . [START ON 10/20/2015] ferrous sulfate  325 mg Oral Q breakfast  . guaiFENesin  600 mg Oral BID  . heparin  5,000 Units Subcutaneous 3 times per day  . magnesium oxide  400 mg Oral QPM  . polyethylene glycol  17 g Oral Daily  . sevelamer carbonate  800 mg Oral TID AC  . sodium chloride flush  3 mL Intravenous Q12H  . tiotropium  18 mcg Inhalation Daily   acetaminophen **OR** acetaminophen, albuterol, morphine injection, ondansetron **OR** ondansetron (ZOFRAN) IV  Assessment/ Plan:  Mr. NA TROWER is a 75 y.o. black male with COPD, bladder cancer, hypertension. Lives at [facility]. TTS  1. End Stage Renal Disease: missed last two dialysis treatments. Now with volume overload and respiratory failure requiring BIPAP - emergent hemodialysis today. Orders prepared.  - Continue to monitor daily for dialysis need. Tentatively continue TTS schedule.   2. Hypertension: blood pressure at goal - amlodipine home regimen   3. Secondary Hyperparathyroidism: PTH phos and Calcium at goal as outpatient - continue sevelamer for binding: 2 tabs with meals.   4. Anemia of chronic kidney disease: hemoglobin 7.7 - epo with dialysis treatment.    LOS: 0 Kaliah Haddaway 1/26/201710:32 AM

## 2015-10-19 NOTE — Progress Notes (Signed)
HD TX Post Notes

## 2015-10-19 NOTE — Progress Notes (Signed)
HD Tx Termination 

## 2015-10-19 NOTE — ED Notes (Signed)
Xray tech in room to perform portable chest xray.

## 2015-10-19 NOTE — Progress Notes (Signed)
Pre HD TX Machine & Patient Checks 

## 2015-10-19 NOTE — Progress Notes (Signed)
Grandfather at [place] NAME: [name]    MR#:  [id number]  DATE OF BIRTH:  22-Nov-1940  SUBJECTIVE: Admitted because of acute respiratory failure secondary to missed hemodialysis treatments. Right now he is on BiPAP.  Emergency hemodialysis is scheduled.  .   CHIEF COMPLAINT:   Chief Complaint  Patient presents with  . Shortness of Breath    REVIEW OF SYSTEMS:   ROS CONSTITUTIONAL: No fever, fatigue or weakness.  EYES: No blurred or double vision.  EARS, NOSE, AND THROAT: No tinnitus or ear pain.  RESPIRATORY: No cough, shortness of breath, wheezing or hemoptysis.  CARDIOVASCULAR: No chest pain, orthopnea, edema.  GASTROINTESTINAL: No nausea, vomiting, diarrhea or abdominal pain.  GENITOURINARY: No dysuria, hematuria.  ENDOCRINE: No polyuria, nocturia,  HEMATOLOGY: No anemia, easy bruising or bleeding SKIN: No rash or lesion. MUSCULOSKELETAL: No joint pain or arthritis.   NEUROLOGIC: No tingling, numbness, weakness.  PSYCHIATRY: No anxiety or depression.   DRUG ALLERGIES:   Allergies  Allergen Reactions  . Penicillins Hives, Itching and Swelling    Has patient had a PCN reaction causing immediate rash, facial/tongue/throat swelling, SOB or lightheadedness with hypotension: Yes Has patient had a PCN reaction causing severe rash involving mucus membranes or skin necrosis: No Has patient had a PCN reaction that required hospitalization No Has patient had a PCN reaction occurring within the last 10 years: No If all of the above answers are "NO", then may proceed with Cephalosporin use.    VITALS:  Blood pressure 120/70, pulse 80, temperature 98.7 F (37.1 C), resp. rate 22, height 6\' 2"  (1.88 m), weight 94.348 kg (208 lb), SpO2 99 %.  PHYSICAL EXAMINATION:  GENERAL:  75 y.o.-year-old patient lying in the bed , appears critically ill.  EYES: Pupils equal, round, reactive to light and accommodation. No scleral  icterus. Extraocular muscles intact.  HEENT: Head atraumatic, normocephalic. Oropharynx and nasopharynx clear.  NECK:  Supple, no jugular venous distention. No thyroid enlargement, no tenderness.  LUNGS: Bilateral basilar crepitations present.no wheezing, rales,rhonchi or crepitation. No use of accessory muscles of respiration.  CARDIOVASCULAR: S1, S2 normal. No murmurs, rubs, or gallops.  ABDOMEN: Soft, nontender, nondistended. Bowel sounds present. No organomegaly or mass.  EXTREMITIES: No pedal edema, cyanosis, or clubbing.  NEUROLOGIC: Cranial nerves II through XII are intact. Muscle strength 5/5 in all extremities. Sensation intact. Gait not checked.  PSYCHIATRIC: The patient is alert and oriented x 3.  SKIN: No obvious rash, lesion, or ulcer.    LABORATORY PANEL:   CBC  Recent Labs Lab 10/19/15 0411  WBC 8.6  HGB 7.7*  HCT 24.7*  PLT 205   ------------------------------------------------------------------------------------------------------------------  Chemistries   Recent Labs Lab 10/19/15 0411  NA 138  K 4.1  CL 98*  CO2 27  GLUCOSE 104*  BUN 54*  CREATININE 3.44*  CALCIUM 8.4*   ------------------------------------------------------------------------------------------------------------------  Cardiac Enzymes  Recent Labs Lab 10/19/15 0411  TROPONINI 0.04*   ------------------------------------------------------------------------------------------------------------------  RADIOLOGY:  Dg Chest Port 1 View  10/19/2015  CLINICAL DATA:  Acute onset of shortness of breath. Initial encounter. EXAM: PORTABLE CHEST 1 VIEW COMPARISON:  Chest radiograph performed 09/11/2015 FINDINGS: The lungs are well-aerated. Bibasilar airspace opacities raise concern for pneumonia. Pulmonary edema could have a similar appearance. Underlying vascular congestion is noted. Small bilateral pleural effusions are suspected. No pneumothorax is seen. The cardiomediastinal silhouette is  mildly enlarged. No acute osseous abnormalities are seen. IMPRESSION: Bibasilar airspace opacities raise concern for pneumonia.  Pulmonary edema could have a similar appearance. Underlying vascular congestion and mild cardiomegaly. Small bilateral pleural effusions suspected. Electronically Signed   By: Garald Balding M.D.   On: 10/19/2015 04:27    EKG:   Orders placed or performed during the hospital encounter of 10/19/15  . ED EKG  . ED EKG    ASSESSMENT AND PLAN:   . 1 acute respiratory failure secondary to pulmonary edema: Continue BiPAP and monitored in ICU. #2 pulmonary edema in the contest of missed hemodialysis sessions: Seen by nephrology, getting  emergency hemodialysis. he received IV Lasix. #3 ESRD: On hemodialysis Tuesday Thursday Saturday: Nephrology is following. 4,. Hypertension essential hypertension: Continue amlodipine. #5 COPD: Continues. Symbicort.Marland Kitchenspiriva #6 proximal atrial fibrillation: Continue amiodarone.   All the records are reviewed and case discussed with Care Management/Social Workerr. Management plans discussed with the patient, family and they are in agreement.  CODE STATUS: full  TOTAL TIME TAKING CARE OF THIS PATIENT: 54minutes.(critical care) POSSIBLE D/C IN  1-2DAYS, DEPENDING ON CLINICAL CONDITION.   Epifanio Lesches M.D on 10/19/2015 at 1:51 PM  Between 7am to 6pm - Pager - [phone]  After 6pm go to [email or web address] - password [email or web address] [facility]  Office  [phone]  CC: Primary care physician; Dion Body, MD   Note: This dictation was prepared with Dragon dictation along with smaller phrase technology. Any transcriptional errors that result from this process are unintentional.

## 2015-10-19 NOTE — ED Notes (Signed)
Respiratory therapist in room to place pt on BiPAP.

## 2015-10-19 NOTE — Progress Notes (Signed)
Pre HD TX Assessment 

## 2015-10-19 NOTE — Progress Notes (Signed)
Initial Nutrition Assessment  DOCUMENTATION CODES:      INTERVENTION:  Meals and snacks: Cater to pt preferences within diet restriction Medical Nutrition Supplement Therapy: continue nepro for added nutrition per MD order   NUTRITION DIAGNOSIS:    (none at this time) related to   as evidenced by  .    GOAL:   Patient will meet greater than or equal to 90% of their needs    MONITOR:    (Energy intake, Electrolyte and renal profile)  REASON FOR ASSESSMENT:    (dialysis pt)    ASSESSMENT:      Pt admitted with shortness of breath, missed 2 HD sessions, pulmonary edema  Past Medical History  Diagnosis Date  . PAF (paroxysmal atrial fibrillation) ([place])     a. new onset 12/2014 in the setting of UTI, sepsis, hypotension, and anemia; b. not on long term anticoagulation given anemia; c. family aware of stroke risk, they are ok with this; d. on amiodarone   . Chronic combined systolic and diastolic CHF (congestive heart failure) ([place])     a. echo 12/2014: EF 25-30%, anterior wall wall motion abnormalities; b. planned ischemic evaluation once patient is stable medically; c. echo 01/2015: EF 45-50%, no RWMA, mild AT, LA mildly dilated, mod pericardial effusion along LV free wall, no evidence of hemodynamic compromise  . ESRD on hemodialysis ([place])     a. Tuesday, Thursday, and Saturdays  . Anemia     a. baseline hgb ~ 8  . History of small bowel obstruction     a. 01/2015  . History of septic shock     a. 01/2015  . GERD (gastroesophageal reflux disease)   . Allergic rhinitis   . COPD (chronic obstructive pulmonary disease) ([place])   . Hypercholesteremia   . Cognitive communication deficit   . Magnesium deficiency   . Dementia   . 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 Incontinence   . Hydronephrosis   . Overactive bladder   . Detrusor sphincter dyssynergia   . Mini stroke ([place])   . Bladder cancer ([place])   . Prostate cancer ([place])   . Renal insufficiency   . Hypertension      Current Nutrition: diet just ordered this am, previously NPO  Food/Nutrition-Related History: normal intake prior to admission   Scheduled Medications:  . amiodarone  200 mg Oral Daily  . amLODipine  2.5 mg Oral Daily  . atorvastatin  40 mg Oral Daily  . budesonide-formoterol  2 puff Inhalation BID  . docusate sodium  100 mg Oral BID  . feeding supplement (NEPRO CARB STEADY)  237 mL Oral BID BM  . [START ON 10/20/2015] ferrous sulfate  325 mg Oral Q breakfast  . guaiFENesin  600 mg Oral BID  . heparin  5,000 Units Subcutaneous 3 times per day  . magnesium oxide  400 mg Oral QPM  . polyethylene glycol  17 g Oral Daily  . sevelamer carbonate  1,600 mg Oral TID WC  . sodium chloride flush  3 mL Intravenous Q12H  . tiotropium  18 mcg Inhalation Daily       Electrolyte/Renal Profile and Glucose Profile:   Recent Labs Lab 10/19/15 0411  NA 138  K 4.1  CL 98*  CO2 27  BUN 54*  CREATININE 3.44*  CALCIUM 8.4*  GLUCOSE 104*    Gastrointestinal Profile: Last BM: unknown    Weight Change: noted wt gain prior to admission, likely fluid    Diet Order:  Diet  renal with fluid restriction Fluid restriction:: 1200 mL Fluid; Room service appropriate?: Yes; Fluid consistency:: Thin  Skin:   reviewed   Height:   Ht Readings from Last 1 Encounters:  10/19/15 6\' 2"  (1.88 m)    Weight:   Wt Readings from Last 1 Encounters:  10/19/15 208 lb (94.348 kg)    Ideal Body Weight:     BMI:  Body mass index is 26.69 kg/(m^2).   EDUCATION NEEDS:   No education needs identified at this time  LOW Care Level  Freddie Nghiem B. Zenia Resides, [place], [place] (pager) Weekend/On-Call pager [phone])

## 2015-10-19 NOTE — H&P (Signed)
[name] is an 75 y.o. male.   Chief Complaint: Shortness of breath HPI: The patient presents to the emergency department from his nursing home complaining of shortness of breath. Upon arrival the patient was hypoxic and tachypneic with rales on physical exam. He is placed on BiPAP which immediately improved his respiratory rate. The patient denies chest pain, fever or recent illness. He admits that he has had a hard time having bowel movements lately. He also admits that he has missed dialysis 2 days in a row (1 extra appointment scheduled due to fluid overload). Chest x-ray in the emergency department revealed pulmonary edema which prompted the emergency department staff to call for admission.  Past Medical History  Diagnosis Date  . PAF (paroxysmal atrial fibrillation) ([place])     a. new onset 12/2014 in the setting of UTI, sepsis, hypotension, and anemia; b. not on long term anticoagulation given anemia; c. family aware of stroke risk, they are ok with this; d. on amiodarone   . Chronic combined systolic and diastolic CHF (congestive heart failure) ([place])     a. echo 12/2014: EF 25-30%, anterior wall wall motion abnormalities; b. planned ischemic evaluation once patient is stable medically; c. echo 01/2015: EF 45-50%, no RWMA, mild AT, LA mildly dilated, mod pericardial effusion along LV free wall, no evidence of hemodynamic compromise  . ESRD on hemodialysis ([place])     a. Tuesday, Thursday, and Saturdays  . Anemia     a. baseline hgb ~ 8  . History of small bowel obstruction     a. 01/2015  . History of septic shock     a. 01/2015  . GERD (gastroesophageal reflux disease)   . Allergic rhinitis   . COPD (chronic obstructive pulmonary disease) ([place])   . Hypercholesteremia   . Cognitive communication deficit   . Magnesium deficiency   . Dementia   . 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 Incontinence   . Hydronephrosis   . Overactive bladder   . Detrusor sphincter dyssynergia   . Mini stroke ([place])    . Bladder cancer ([place])   . Prostate cancer ([place])   . Renal insufficiency   . Hypertension     Past Surgical History  Procedure Laterality Date  . Cystoscopy w/ ureteral stent placement    . Transurethral resection of bladder tumor with gyrus (turbt-gyrus)    . Ureteral stent placement    . Prostate surgery      Removal  . Av fistula placement      left arm    Family History  Problem Relation Age of Onset  . Stroke Mother    Social History:  reports that he quit smoking about 5 months ago. He has never used smokeless tobacco. He reports that he does not drink alcohol or use illicit drugs.  Allergies:  Allergies  Allergen Reactions  . Penicillins Hives, Itching and Swelling    Has patient had a PCN reaction causing immediate rash, facial/tongue/throat swelling, SOB or lightheadedness with hypotension: Yes Has patient had a PCN reaction causing severe rash involving mucus membranes or skin necrosis: No Has patient had a PCN reaction that required hospitalization No Has patient had a PCN reaction occurring within the last 10 years: No If all of the above answers are "NO", then may proceed with Cephalosporin use.    Prior to Admission medications   Medication Sig Start Date End Date Taking? Authorizing Provider  albuterol (PROVENTIL) (2.5 MG/3ML) 0.083% nebulizer solution Take 3 mLs (  2.5 mg total) by nebulization every 4 (four) hours as needed for wheezing or shortness of breath. 06/10/15  Yes Fritzi Mandes, MD  amiodarone (PACERONE) 200 MG tablet Take 1 tablet (200 mg total) by mouth daily. 09/13/15  Yes Gladstone Lighter, MD  amLODipine (NORVASC) 2.5 MG tablet Take 2.5 mg by mouth daily.   Yes Historical Provider, MD  atorvastatin (LIPITOR) 40 MG tablet Take 1 tablet (40 mg total) by mouth daily. 09/13/15  Yes Gladstone Lighter, MD  budesonide-formoterol (SYMBICORT) 160-4.5 MCG/ACT inhaler Inhale 2 puffs into the lungs 2 (two) times daily.   Yes Historical Provider, MD  ferrous  sulfate 325 (65 FE) MG tablet Take 1 tablet (325 mg total) by mouth daily with breakfast. 09/13/15  Yes Gladstone Lighter, MD  guaiFENesin (MUCINEX) 600 MG 12 hr tablet Take 1 tablet (600 mg total) by mouth 2 (two) times daily. 09/13/15  Yes Gladstone Lighter, MD  magnesium oxide (MAG-OX) 400 MG tablet Take 1 tablet (400 mg total) by mouth every evening. 09/13/15  Yes Gladstone Lighter, MD  Nutritional Supplements (FEEDING SUPPLEMENT, NEPRO CARB STEADY,) LIQD Take 237 mLs by mouth 2 (two) times daily between meals. 09/13/15  Yes Gladstone Lighter, MD  polyethylene glycol (MIRALAX / GLYCOLAX) packet Take 17 g by mouth daily. 09/13/15  Yes Gladstone Lighter, MD  sevelamer carbonate (RENVELA) 800 MG tablet Take 800 mg by mouth 3 (three) times daily before meals.    Yes Historical Provider, MD  tiotropium (SPIRIVA) 18 MCG inhalation capsule Place 1 capsule (18 mcg total) into inhaler and inhale daily. 09/13/15  Yes Gladstone Lighter, MD     Results for orders placed or performed during the hospital encounter of 10/19/15 (from the past 48 hour(s))  Basic metabolic panel     Status: Abnormal   Collection Time: 10/19/15  4:11 AM  Result Value Ref Range   Sodium 138 135 - 145 mmol/L   Potassium 4.1 3.5 - 5.1 mmol/L   Chloride 98 (L) 101 - 111 mmol/L   CO2 27 22 - 32 mmol/L   Glucose, Bld 104 (H) 65 - 99 mg/dL   BUN 54 (H) 6 - 20 mg/dL   Creatinine, Ser 3.44 (H) 0.61 - 1.24 mg/dL   Calcium 8.4 (L) 8.9 - 10.3 mg/dL   GFR calc non Af Amer 16 (L) >60 mL/min   GFR calc Af Amer 19 (L) >60 mL/min    Comment: (NOTE) The eGFR has been calculated using the CKD EPI equation. This calculation has not been validated in all clinical situations. eGFR's persistently <60 mL/min signify possible Chronic Kidney Disease.    Anion gap 13 5 - 15  CBC     Status: Abnormal   Collection Time: 10/19/15  4:11 AM  Result Value Ref Range   WBC 8.6 3.8 - 10.6 K/uL   RBC 2.87 (L) 4.40 - 5.90 MIL/uL   Hemoglobin 7.7 (L)  13.0 - 18.0 g/dL   HCT 24.7 (L) 40.0 - 52.0 %   MCV 86.1 80.0 - 100.0 fL   MCH 26.8 26.0 - 34.0 pg   MCHC 31.2 (L) 32.0 - 36.0 g/dL   RDW 16.6 (H) 11.5 - 14.5 %   Platelets 205 150 - 440 K/uL  Troponin I     Status: Abnormal   Collection Time: 10/19/15  4:11 AM  Result Value Ref Range   Troponin I 0.04 (H) <0.031 ng/mL    Comment: READ BACK AND VERIFIED WITH JEANINA RODRUGUEZ AT 2952 10/19/15.PM[facility]  PERSISTENTLY INCREASED TROPONIN VALUES IN THE RANGE OF 0.04-0.49 ng/mL CAN BE SEEN IN:       -UNSTABLE ANGINA       -CONGESTIVE HEART FAILURE       -MYOCARDITIS       -CHEST TRAUMA       -ARRYHTHMIAS       -LATE PRESENTING MYOCARDIAL INFARCTION       -COPD   CLINICAL FOLLOW-UP RECOMMENDED.    Dg Chest Port 1 View  10/19/2015  CLINICAL DATA:  Acute onset of shortness of breath. Initial encounter. EXAM: PORTABLE CHEST 1 VIEW COMPARISON:  Chest radiograph performed 09/11/2015 FINDINGS: The lungs are well-aerated. Bibasilar airspace opacities raise concern for pneumonia. Pulmonary edema could have a similar appearance. Underlying vascular congestion is noted. Small bilateral pleural effusions are suspected. No pneumothorax is seen. The cardiomediastinal silhouette is mildly enlarged. No acute osseous abnormalities are seen. IMPRESSION: Bibasilar airspace opacities raise concern for pneumonia. Pulmonary edema could have a similar appearance. Underlying vascular congestion and mild cardiomegaly. Small bilateral pleural effusions suspected. Electronically Signed   By: Garald Balding M.D.   On: 10/19/2015 04:27    Review of Systems  Constitutional: Negative for fever and chills.  HENT: Negative for sore throat and tinnitus.   Eyes: Negative for blurred vision and redness.  Respiratory: Positive for shortness of breath. Negative for cough.   Cardiovascular: Negative for chest pain, palpitations, orthopnea and PND.  Gastrointestinal: Positive for abdominal pain and constipation. Negative for  nausea, vomiting and diarrhea.  Genitourinary: Negative for dysuria, urgency and frequency.  Musculoskeletal: Negative for myalgias and joint pain.  Skin: Negative for rash.       No lesions  Neurological: Negative for speech change, focal weakness and weakness.  Endo/Heme/Allergies: Does not bruise/bleed easily.       No temperature intolerance  Psychiatric/Behavioral: Negative for depression and suicidal ideas.    Blood pressure 156/84, pulse 85, temperature 98.7 F (37.1 C), resp. rate 30, height 6' 2" (1.88 m), weight 94.348 kg (208 lb), SpO2 100 %. Physical Exam  Constitutional: He is oriented to person, place, and time. He appears well-developed and well-nourished. No distress.  HENT:  Head: Normocephalic and atraumatic.  Mouth/Throat: Oropharynx is clear and moist.  Eyes: Conjunctivae and EOM are normal. Pupils are equal, round, and reactive to light. No scleral icterus.  Neck: Normal range of motion. No JVD present. No tracheal deviation present.  Cardiovascular: Normal rate, regular rhythm and normal heart sounds.  Exam reveals no gallop and no friction rub.   No murmur heard. Respiratory: He is in respiratory distress. He has no wheezes. He has rales.  GI: Soft. Bowel sounds are normal. He exhibits distension. He exhibits no mass. There is tenderness. There is no rebound and no guarding.  Genitourinary:  Deferred  Musculoskeletal: Normal range of motion. He exhibits no edema.  Lymphadenopathy:    He has no cervical adenopathy.  Neurological: He is alert and oriented to person, place, and time. No cranial nerve deficit.  Skin: Skin is warm and dry. No rash noted. No erythema.  Psychiatric: He has a normal mood and affect. His behavior is normal. Judgment and thought content normal.     Assessment/Plan This is a 75 year old [place] American male with end-stage renal disease on dialysis who is admitted for respiratory failure with hypoxia. 1. Respiratory failure: Hypoxia  present prior to supplemental O2. The patient's respiratory rate and oxygen saturations have improved on BiPAP. Continue noninvasive positive pressure ventilation until the  patient is more comfortable. 2. Acute pulmonary edema: Secondary to fluid overload due to missed dialysis (the patient also has systolic congestive heart failure). The patient will need a case management consult in order to arrange transportation to dialysis for which she states his nursing home facility was requiring payment.  3. End-stage renal disease: The patient usually receives dialysis Tuesday Thursday Saturday. He has missed 2 treatments this week. We will arrange for dialysis today. Nephrology consult placed. Continue Renvela 4. Essential hypertension: Continue amlodipine 5. COPD: Continue Spiriva and Symbicort 6. Paroxysmal atrial fibrillation: Continue amlodipine 7. Hypercholesterolemia: Continue atorvastatin 8. DVT prophylaxis: Heparin (the patient is guaiac positive likely secondary to anal fissure) 9. GI prophylaxis: None The patient is a full code. Time spent on admission orders and critical patient care approximate 45 minutes  Harrie Foreman 10/19/2015, 6:42 AM

## 2015-10-19 NOTE — Progress Notes (Signed)
HD TX Initiation 

## 2015-10-20 LAB — MISC LABCORP TEST (SEND OUT): Labcorp test code: 1453

## 2015-10-20 LAB — HEPATITIS B SURFACE ANTIGEN: Hepatitis B Surface Ag: NEGATIVE

## 2015-10-20 MED ORDER — CHLORHEXIDINE GLUCONATE CLOTH 2 % EX PADS: 6.0000 | MEDICATED_PAD | Freq: Every day | CUTANEOUS | Status: DC

## 2015-10-20 MED ORDER — MUPIROCIN 2 % EX OINT
1.0000 "application " | TOPICAL_OINTMENT | Freq: Two times a day (BID) | CUTANEOUS | Status: DC
  Administered 2015-10-20: 1 via NASAL
  Filled 2015-10-20: qty 22

## 2015-10-20 MED ORDER — NEPRO/CARBSTEADY PO LIQD
237.0000 mL | Freq: Two times a day (BID) | ORAL | Status: DC
  Administered 2015-10-20: 237 mL via ORAL

## 2015-10-20 NOTE — Progress Notes (Signed)
Clinical Social Worker informed by Epifanio Lesches, MD that patient is medically ready to discharge back to SNF, Patient is in a agreement with plan.  Patient states has spoken to his cousin does not need CSW to call.   Call to [facility] to confirm that patient's bed is ready. Provided patient's room number 9-B and number to call for report [phone] .  States patient is unable to return until he signs form.  Staff from [facility] on the way as patient is in agreement to complete paper work for them to receive payment.    All discharge information faxed to  Facility.   RN will call report and patient will discharge to [facility] via EMS.  Casimer Lanius. [place] Work Department [phone] 2:22 PM

## 2015-10-20 NOTE — Progress Notes (Signed)
[facility]  ROUNDING NOTE   Subjective:   Emergent hemodialysis yesterday. UF of 3 litres. Taken off Bipap. Now breathing room air.  Patient sitting up and eating breakfast.  States he cannot afford transportation to go to dialysis.   Objective:  Vital signs in last 24 hours:  Temp:  [96.5 F (35.8 C)-98.6 F (37 C)] 98.6 F (37 C) (01/27 0800) Pulse Rate:  [72-95] 76 (01/27 0800) Resp:  [18-29] 27 (01/27 0800) BP: (76-139)/(50-89) 127/64 mmHg (01/27 0800) SpO2:  [95 %-100 %] 100 % (01/27 0800) FiO2 (%):  [30 %] 30 % (01/26 1715) Weight:  [82.7 kg (182 lb 5.1 oz)-85.7 kg (188 lb 15 oz)] 84.1 kg (185 lb 6.5 oz) (01/27 0548)  Weight change: -8.648 kg (-19 lb 1.1 oz) Filed Weights   10/19/15 1345 10/19/15 1715 10/20/15 0548  Weight: 85.7 kg (188 lb 15 oz) 82.7 kg (182 lb 5.1 oz) 84.1 kg (185 lb 6.5 oz)    Intake/Output: I/O last 3 completed shifts: In: -  Out: 3000 [Other:3000]   Intake/Output this shift:  Total I/O In: 240 [P.O.:240] Out: -   Physical Exam: General: NAD  Head: Normocephalic, atraumatic.   Eyes: Anicteric, PERRL  Neck: Supple, trachea midline  Lungs:  clear  Heart: Regular rate and rhythm  Abdomen:  Soft, nontender  Extremities: trace peripheral edema.  Neurologic: Nonfocal, moving all four extremities  Skin: No lesions  Access: Left arm AVF    Basic Metabolic Panel:  Recent Labs Lab 10/19/15 0411 10/19/15 1419  NA 138 135  K 4.1 3.9  CL 98* 97*  CO2 27 28  GLUCOSE 104* 87  BUN 54* 62*  CREATININE 3.44* 3.46*  CALCIUM 8.4* 8.3*  PHOS  --  2.5    Liver Function Tests:  Recent Labs Lab 10/19/15 1419  ALBUMIN 2.5*   No results for input(s): LIPASE, AMYLASE in the last 168 hours. No results for input(s): AMMONIA in the last 168 hours.  CBC:  Recent Labs Lab 10/19/15 0411  WBC 8.6  HGB 7.7*  HCT 24.7*  MCV 86.1  PLT 205    Cardiac Enzymes:  Recent Labs Lab 10/19/15 0411  TROPONINI 0.04*     BNP: Invalid input(s): POCBNP  CBG:  Recent Labs Lab 10/19/15 0825  GLUCAP 110*    Microbiology: Results for orders placed or performed during the hospital encounter of 09/11/15  MRSA PCR Screening     Status: None   Collection Time: 09/11/15 11:30 AM  Result Value Ref Range Status   MRSA by PCR NEGATIVE NEGATIVE Final    Comment:        The GeneXpert MRSA Assay (FDA approved for NASAL specimens only), is one component of a comprehensive MRSA colonization surveillance program. It is not intended to diagnose MRSA infection nor to guide or monitor treatment for MRSA infections.   C difficile quick scan w PCR reflex     Status: Abnormal   Collection Time: 09/12/15 12:40 PM  Result Value Ref Range Status   C Diff antigen POSITIVE (A) NEGATIVE Final   C Diff toxin NEGATIVE NEGATIVE Final   C Diff interpretation   Final    Negative for toxigenic C. difficile. Toxin gene and active toxin production not detected. May be a nontoxigenic strain of C. difficile bacteria present, lacking the ability to produce toxin.  Clostridium Difficile by PCR     Status: None   Collection Time: 09/12/15 12:40 PM  Result Value Ref Range Status  Toxigenic C Difficile by pcr NEGATIVE NEGATIVE Final    Coagulation Studies: No results for input(s): LABPROT, INR in the last 72 hours.  Urinalysis: No results for input(s): COLORURINE, LABSPEC, PHURINE, GLUCOSEU, HGBUR, BILIRUBINUR, KETONESUR, PROTEINUR, UROBILINOGEN, NITRITE, LEUKOCYTESUR in the last 72 hours.  Invalid input(s): APPERANCEUR    Imaging: Dg Chest Port 1 View  10/19/2015  CLINICAL DATA:  Acute onset of shortness of breath. Initial encounter. EXAM: PORTABLE CHEST 1 VIEW COMPARISON:  Chest radiograph performed 09/11/2015 FINDINGS: The lungs are well-aerated. Bibasilar airspace opacities raise concern for pneumonia. Pulmonary edema could have a similar appearance. Underlying vascular congestion is noted. Small bilateral pleural  effusions are suspected. No pneumothorax is seen. The cardiomediastinal silhouette is mildly enlarged. No acute osseous abnormalities are seen. IMPRESSION: Bibasilar airspace opacities raise concern for pneumonia. Pulmonary edema could have a similar appearance. Underlying vascular congestion and mild cardiomegaly. Small bilateral pleural effusions suspected. Electronically Signed   By: Garald Balding M.D.   On: 10/19/2015 04:27     Medications:     . amiodarone  200 mg Oral Daily  . amLODipine  2.5 mg Oral Daily  . atorvastatin  40 mg Oral Daily  . budesonide-formoterol  2 puff Inhalation BID  . [START ON 10/21/2015] Chlorhexidine Gluconate Cloth  6 each Topical Q0600  . docusate sodium  100 mg Oral BID  . feeding supplement (NEPRO CARB STEADY)  237 mL Oral BID BM  . ferrous sulfate  325 mg Oral Q breakfast  . guaiFENesin  600 mg Oral BID  . heparin  5,000 Units Subcutaneous 3 times per day  . magnesium oxide  400 mg Oral QPM  . mupirocin ointment  1 application Nasal BID  . polyethylene glycol  17 g Oral Daily  . sevelamer carbonate  1,600 mg Oral TID WC  . sodium chloride flush  3 mL Intravenous Q12H  . tiotropium  18 mcg Inhalation Daily   acetaminophen **OR** acetaminophen, albuterol, morphine injection, ondansetron **OR** ondansetron (ZOFRAN) IV  Assessment/ Plan:  [name] is a 75 y.o. black male with COPD, bladder cancer, hypertension. Lives at [facility] [place]. TTS  1. End Stage Renal Disease: missed last two outpatient dialysis treatments. Required emergent hemodialysis due to respiratory failure from pulmonary edema yesterday 1/26 - No acute indication for dialysis at this time. Continue TTS schedule, dialysis for tomorrow.  - Need to figure out patient's transportation issues so he can get to his scheduled outpatient dialysis treatments. Have discussed with care management.   2. Hypertension: blood pressure at goal - amlodipine home  regimen   3. Secondary Hyperparathyroidism: PTH phos and Calcium at goal as outpatient - continue sevelamer for binding: 2 tabs with meals.   4. Anemia of chronic kidney disease: hemoglobin 7.7 - epo with dialysis treatment.    LOS: [place], [place] 1/27/20179:24 AM

## 2015-10-20 NOTE — Progress Notes (Signed)
Patient admitted to ICU 3 from ED at around 530 this am for sepsis on BIPAP, patient alert and oriented, sinus tach on cardiac monitor.   Patient voided via urinal. Currently resting in no apparent distress.

## 2015-10-20 NOTE — NC FL2 (Signed)
[place] LEVEL OF CARE SCREENING TOOL     IDENTIFICATION  Patient Name: [name] Birthdate: [birth year] Sex: male Admission Date (Current Location): 10/19/2015  [place] and [place] Number:  Engineering geologist and Address:  [facility], [address], [place], [place] [address]      Provider Number: [id number]  Attending Physician Name and Address:  Epifanio Lesches, MD  Relative Name and Phone Number:       Current Level of Care: Hospital Recommended Level of Care: [place] Prior Approval Number:    Date Approved/Denied:   PASRR Number:  ([id number])  Discharge Plan: SNF    Current Diagnoses: Patient Active Problem List   Diagnosis Date Noted  . Respiratory failure with hypoxia ([place]) 10/19/2015  . Acute respiratory failure ([place]) 09/11/2015  . Chronic diastolic heart failure ([place]) 08/11/2015  . Fluid overload 07/23/2015  . Generalized weakness 05/08/2015  . Other emphysema ([place]) 05/08/2015  . Tobacco use disorder 05/08/2015  . Hypokalemia 05/08/2015  . Pressure ulcer 05/05/2015  . Symptomatic anemia 05/04/2015  . CAFL (chronic airflow limitation) ([place]) 04/10/2015  . Malnutrition of moderate degree ([place]) 03/22/2015  . Pneumonia 03/20/2015  . History of sepsis 02/27/2015  . Incontinence 02/27/2015  . Persistent atrial fibrillation ([place]) 02/27/2015  . Preoperative cardiovascular examination 02/27/2015  . Chronic combined systolic and diastolic CHF (congestive heart failure) ([place])   . ESRD on hemodialysis ([place])   . Anemia   . History of small bowel obstruction   . Atrial fibrillation with RVR ([place]) 01/21/2015  . Chronic kidney disease (CKD), stage IV (severe) ([place]) 03/01/2014  . Essential (primary) hypertension 03/01/2014  . 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 Cystitis 11/11/2012  . Hydronephrosis 08/27/2012  . Bladder cancer ([place]) 08/27/2012  . CA of prostate ([place]) 08/27/2012  . Bone  metastases ([place]) 08/27/2012    Orientation RESPIRATION BLADDER Height & Weight    Self, Time, Situation, Place  O2  (dialysis ) 6\' 2"  (188 cm) 208 lbs.  BEHAVIORAL SYMPTOMS/MOOD NEUROLOGICAL BOWEL NUTRITION STATUS      Continent Diet (Renal with fluid restrictions)  AMBULATORY STATUS COMMUNICATION OF NEEDS Skin   Extensive Assist Verbally Normal                       Personal Care Assistance Level of Assistance  Bathing, Feeding, Dressing Bathing Assistance: Maximum assistance Feeding assistance: Limited assistance Dressing Assistance: Maximum assistance     Functional Limitations Info  Sight, Hearing, Speech Sight Info: Adequate Hearing Info: Adequate Speech Info: Adequate    SPECIAL CARE FACTORS FREQUENCY                       Contractures      Additional Factors Info  Allergies, Isolation Precautions   Allergies Info:  (Penicillins)     Isolation Precautions Info:  (09/11/15 pcr negative)     Current Medications (10/20/2015):  This is the current hospital active medication list Current Facility-Administered Medications  Medication Dose Route Frequency Provider Last Rate Last Dose  . acetaminophen (TYLENOL) tablet 650 mg  650 mg Oral Q6H PRN Harrie Foreman, MD       Or  . acetaminophen (TYLENOL) suppository 650 mg  650 mg Rectal Q6H PRN Harrie Foreman, MD      . albuterol (PROVENTIL) (2.5 MG/3ML) 0.083% nebulizer solution 2.5 mg  2.5 mg Nebulization Q4H PRN Harrie Foreman, MD      .  amiodarone (PACERONE) tablet 200 mg  200 mg Oral Daily Harrie Foreman, MD   200 mg at 10/20/15 0926  . amLODipine (NORVASC) tablet 2.5 mg  2.5 mg Oral Daily Harrie Foreman, MD   2.5 mg at 10/20/15 0926  . atorvastatin (LIPITOR) tablet 40 mg  40 mg Oral Daily Harrie Foreman, MD   40 mg at 10/20/15 [id number]  . budesonide-formoterol (SYMBICORT) 160-4.5 MCG/ACT inhaler 2 puff  2 puff Inhalation BID Harrie Foreman, MD   2 puff at 10/20/15 [phone]  . docusate  sodium (COLACE) capsule 100 mg  100 mg Oral BID Harrie Foreman, MD   100 mg at 10/20/15 [id number]  . feeding supplement (NEPRO CARB STEADY) liquid 237 mL  237 mL Oral BID BM Harrie Foreman, MD      . ferrous sulfate tablet 325 mg  325 mg Oral Q breakfast Harrie Foreman, MD   325 mg at 10/20/15 [id number]  . guaiFENesin (MUCINEX) 12 hr tablet 600 mg  600 mg Oral BID Harrie Foreman, MD   600 mg at 10/20/15 [id number]  . heparin injection 5,000 Units  5,000 Units Subcutaneous 3 times per day Harrie Foreman, MD   5,000 Units at 10/20/15 0606  . magnesium oxide (MAG-OX) tablet 400 mg  400 mg Oral QPM Harrie Foreman, MD   400 mg at 10/19/15 1759  . morphine 2 MG/ML injection 1 mg  1 mg Intravenous Q3H PRN Harrie Foreman, MD      . ondansetron [facility]) tablet 4 mg  4 mg Oral Q6H PRN Harrie Foreman, MD       Or  . ondansetron [facility]) injection 4 mg  4 mg Intravenous Q6H PRN Harrie Foreman, MD      . polyethylene glycol [facility] / GLYCOLAX) packet 17 g  17 g Oral Daily Harrie Foreman, MD   17 g at 10/20/15 [id number]  . sevelamer carbonate (RENVELA) tablet 1,600 mg  1,600 mg Oral TID WC Lavonia Dana, MD   1,600 mg at 10/20/15 0814  . sodium chloride flush (NS) 0.9 % injection 3 mL  3 mL Intravenous Q12H Harrie Foreman, MD   3 mL at 10/20/15 0927  . tiotropium (SPIRIVA) inhalation capsule 18 mcg  18 mcg Inhalation Daily Harrie Foreman, MD   18 mcg at 10/20/15 [id number]     Discharge Medications: Please see discharge summary for a list of discharge medications.  Relevant Imaging Results:  Relevant Lab Results:   Additional Information Dialysis T TH S [id number])  Maurine Cane, LCSW

## 2015-10-20 NOTE — Progress Notes (Signed)
Report given to Lenice Pressman at [facility]. Patient ready to be discharged back to their facility. Paperwork is in order and no other changes noted. Will call EMS for transport.

## 2015-10-20 NOTE — Discharge Instructions (Signed)
Heart Failure Clinic appointment on November 06, 2015 at 9:30am with Darylene Price, [place]. Please call [phone] to reschedule.

## 2015-10-20 NOTE — Clinical Social Work Note (Signed)
Clinical Social Work Assessment  Patient Details  Name: [name] MRN: [id number] Date of Birth: 12-Feb-1941  Date of referral:  10/20/15               Reason for consult:   (from [facility])                Permission sought to share information with:  Facility Sport and exercise psychologist, Family Supports Permission granted to share information::  Yes, Verbal Permission Granted  Name::      [name] Cousin)  Agency::     Relationship::     Contact Information:     Housing/Transportation Living arrangements for the past 2 months:  [place], [place] of Information:  Patient, Medical Team, Facility Patient Interpreter Needed:  None Criminal Activity/Legal Involvement Pertinent to Current Situation/Hospitalization:    Significant Relationships:  Other Family Members, [employer] Lives with:  Self Do you feel safe going back to the place where you live?  No (unable to care for self) Need for family participation in patient care:     Care giving concerns:  None at this time   Facilities manager / plan:  Holiday representative ([place]) consult, patient is from [facility].   Patient was alert, oriented and engaged in conversation with CSW.  (Additional Information provided by Helene Kelp at [facility]).  CSW introduced self and explained role of CSW department. Patient currently lives alone, was recently discharged from [facility] to [facility] for STR and plans to return at discharge.   Patient's support system is limited to his cousin [name].   Call to [facility] to confirm patient is able to return.  Per Helene Kelp patient has used all of his short term days.  They have spoken to him about long-term care.  Patient will need to sign the rep. payee form in order for the facility to take him back as LTC. He is unable to return if he does not sign the form, they has met with patient and informed him.  Per [facility] patient went to dialysis on Tuesday, he had  extra fluid so they wanted him to return on Wed. Stated patient refused to go on Wed and ended up in the Hospital on Thursday.  States patient was required to pay for his transportation to dialysis as he has used all SNF days and would not sign the needed paper work for [employer].  Once he signs the paper [facility] will cover his transportation to dialysis.  Per Helene Kelp at [facility]  she came to talk with patient again about this but he was on Bi-PAP.   CSW met with patient to discuss discharge planning.  Patient states he would like to return to [facility] if they will take him.  CSW reviewed information with patient shared by [facility].  Patient states he understands he is unable to care for himself and is willing to sign the papers.  He does not want to worry about anything and signing the paper means [facility] will take care of all of his needs including his transportation to dialysis.  CSW informed Helene Kelp with [facility] that patient is willing to sign the papers.  Per Helene Kelp she will come on Monday to bring the form.  If patient is ready for discharge prior to Monday please call and she will make arrangements to bring the form for patient to sign.      CSW will complete FL2,  in anticipation of patient returning to  [place] at discharge.   Employment status:  Disabled (Comment on whether or not currently receiving Disability) Insurance information:  Managed Medicare PT Recommendations:  Not assessed at this time Information / Referral to community resources:  [place]  Patient/Family's Response to care:  Patient was appreciative of talking with CSW, states he is willing to sign the papers in order to return to [facility].  Patient/Family's Understanding of and Emotional Response to Diagnosis, Current Treatment, and Prognosis:  Patient understands that he is under continued medical work up at this time.  Once medically stable he will discharge back to [facility].  Emotional Assessment Appearance:  Appears stated  age Attitude/Demeanor/Rapport:    Affect (typically observed):  Accepting, Adaptable, Pleasant, Calm, Overwhelmed Orientation:  Oriented to Self, Oriented to Place, Oriented to Situation Alcohol / Substance use:    Psych involvement (Current and /or in the community):  No (Comment)  Discharge Needs  Concerns to be addressed:  Care Coordination, Discharge Planning Concerns Readmission within the last 30 days:  No Current discharge risk:  Chronically ill, Dependent with Mobility, Lives alone Barriers to Discharge:  Continued Medical Work up   Maurine Cane, LCSW 10/20/2015, 9:55 AM

## 2015-10-20 NOTE — Care Management (Signed)
There is documentation that patient is from [facility].  Placed csw referral.  Patient with esrd and on chronic dialysis T TH S.  There is consult for CM to assess issues with transportation to dialysis.  CM will assess.  Patient presented with hypoxia and admitted to icu due to need for continuous bipap.  Now on nasal cannula

## 2015-10-20 NOTE — Care Management (Signed)
The concern over patient's transportation to dialysis was the he had refused to sign the rep payee form at [facility] for long term care- therefore facility.  He has agreed to sign this form in order to return to facility and [facility] will coordinate his transportation without cost to the patient.

## 2015-10-20 NOTE — Progress Notes (Signed)
Spoke with Claiborne Rigg, [facility] rep at [phone], to notify of non-emergent EMS transport.  Auth notification reference given as [id number].   Service date range good from 10/20/15 - 01/18/16.   Gap exception requested to determine if services can be considered at an in-network level.

## 2015-10-20 NOTE — Discharge Summary (Signed)
[name], is a 75 y.o. male  DOB 1941/09/03  MRN [id number].  Admission date:  10/19/2015  Admitting Physician  Harrie Foreman, MD  Discharge Date:  10/20/2015   Primary MD  Dion Body, MD  Recommendations for primary care physician for things to follow:   Patient needs hemodialysis at [place] [facility] on Tuesday, Thursday, Saturday.   Admission Diagnosis  Acute respiratory failure with hypoxia (HCC) [J96.01]   Discharge Diagnosis  Acute respiratory failure with hypoxia ([place]) [J96.01]    Active Problems:   Respiratory failure with hypoxia [facility])      Past Medical History  Diagnosis Date  . PAF (paroxysmal atrial fibrillation) ([place])     a. new onset 12/2014 in the setting of UTI, sepsis, hypotension, and anemia; b. not on long term anticoagulation given anemia; c. family aware of stroke risk, they are ok with this; d. on amiodarone   . Chronic combined systolic and diastolic CHF (congestive heart failure) ([place])     a. echo 12/2014: EF 25-30%, anterior wall wall motion abnormalities; b. planned ischemic evaluation once patient is stable medically; c. echo 01/2015: EF 45-50%, no RWMA, mild AT, LA mildly dilated, mod pericardial effusion along LV free wall, no evidence of hemodynamic compromise  . ESRD on hemodialysis ([place])     a. Tuesday, Thursday, and Saturdays  . Anemia     a. baseline hgb ~ 8  . History of small bowel obstruction     a. 01/2015  . History of septic shock     a. 01/2015  . GERD (gastroesophageal reflux disease)   . Allergic rhinitis   . COPD (chronic obstructive pulmonary disease) ([place])   . Hypercholesteremia   . Cognitive communication deficit   . Magnesium deficiency   . Dementia   . 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 Incontinence   . Hydronephrosis   . Overactive bladder   .  Detrusor sphincter dyssynergia   . Mini stroke ([place])   . Bladder cancer ([place])   . Prostate cancer ([place])   . Renal insufficiency   . Hypertension     Past Surgical History  Procedure Laterality Date  . Cystoscopy w/ ureteral stent placement    . Transurethral resection of bladder tumor with gyrus (turbt-gyrus)    . Ureteral stent placement    . Prostate surgery      Removal  . Av fistula placement      left arm       History of present illness and  Hospital Course:     Kindly see H&P for history of present illness and admission details, please review complete Labs, Consult reports and Test reports for all details in brief  HPI  from the history and physical done on the day of admission 75 year old the male patient admitted because of shortness of breath, tachypnea and hypoxia, respiratory distress. Patient missed hemodialysis sessions 2 sessions in a row. So he is admitted to ICU for respiratory failure due to pulmonary edema.   Hospital Course  #1 acute respiratory failure secondary to pulmonary edema. Patient started on BiPAP. Admitted to ICU. #2 acute pulmonary edema secondary to fluid overload from missed hemodialysis sessions. Patient missed hemodialysis last Saturday and this Tuesday. He was seen by nephrology Dr.Kolluru, arranged for emergency hemodialysis yesterday, 3 L of ultrafiltration done yesterday. After dialysis patient the respiratory distress improved and he was able to breathe on room air. And BiPAP was removed.  D/c to [place] today.  2. ESRD on hemodialysis Tuesday Thursday Saturday. #3 hypertension controlled continue amlodipine #4 COPD: No wheezing. Continue Symbicort, Spiriva. #5 proximal atrial fibrillation: Rate controlled. Continue amiodarone. : 6 acute on chronic systolic heart failure: Evidence of fluid overload and needing emergency hemodialysis.  Discharge Condition: stable   Follow UP  Follow-up Information    Follow up with Alisa Graff, FNP. Go on 11/06/2015.   Specialty:  Family Medicine   Why:  at 9:30am , to the Heart Failure Clinic   Contact information:   [address] [id number] [phone]         Discharge Instructions  and  Discharge Medications        Medication List    TAKE these medications        albuterol (2.5 MG/3ML) 0.083% nebulizer solution  Commonly known as:  PROVENTIL  Take 3 mLs (2.5 mg total) by nebulization every 4 (four) hours as needed for wheezing or shortness of breath.     amiodarone 200 MG tablet  Commonly known as:  PACERONE  Take 1 tablet (200 mg total) by mouth daily.     amLODipine 2.5 MG tablet  Commonly known as:  NORVASC  Take 2.5 mg by mouth daily.     atorvastatin 40 MG tablet  Commonly known as:  LIPITOR  Take 1 tablet (40 mg total) by mouth daily.     budesonide-formoterol 160-4.5 MCG/ACT inhaler  Commonly known as:  SYMBICORT  Inhale 2 puffs into the lungs 2 (two) times daily.     feeding supplement (NEPRO CARB STEADY) Liqd  Take 237 mLs by mouth 2 (two) times daily between meals.     ferrous sulfate 325 (65 FE) MG tablet  Take 1 tablet (325 mg total) by mouth daily with breakfast.     guaiFENesin 600 MG 12 hr tablet  Commonly known as:  MUCINEX  Take 1 tablet (600 mg total) by mouth 2 (two) times daily.     magnesium oxide 400 MG tablet  Commonly known as:  MAG-OX  Take 1 tablet (400 mg total) by mouth every evening.     polyethylene glycol packet  Commonly known as:  MIRALAX / GLYCOLAX  Take 17 g by mouth daily.     sevelamer carbonate 800 MG tablet  Commonly known as:  RENVELA  Take 800 mg by mouth 3 (three) times daily before meals.     tiotropium 18 MCG inhalation capsule  Commonly known as:  SPIRIVA  Place 1 capsule (18 mcg total) into inhaler and inhale daily.          Diet and Activity recommendation: See Discharge Instructions above   Consults obtained - nephrology   Major  procedures and Radiology Reports - PLEASE review detailed and final reports for all details, in brief -     Dg Chest Port 1 View  10/19/2015  CLINICAL DATA:  Acute onset of shortness of breath. Initial encounter. EXAM: PORTABLE CHEST 1 VIEW COMPARISON:  Chest radiograph performed 09/11/2015 FINDINGS: The lungs are well-aerated. Bibasilar airspace opacities raise concern for pneumonia. Pulmonary edema could have a similar appearance. Underlying vascular congestion is noted. Small bilateral pleural effusions are suspected. No pneumothorax is seen. The cardiomediastinal silhouette is mildly enlarged. No acute osseous abnormalities are seen. IMPRESSION: Bibasilar airspace opacities raise concern for pneumonia. Pulmonary edema could have a similar appearance. Underlying vascular congestion and mild cardiomegaly. Small bilateral pleural effusions suspected. Electronically Signed   By: Garald Balding  M.D.   On: 10/19/2015 04:27    Micro Results     No results found for this or any previous visit (from the past 240 hour(s)).     Today   Subjective:   Harrold Donath today has no headache,no chest abdominal pain,no sob, patient stable for discharge [facility] today. Objective:   Blood pressure 127/64, pulse 76, temperature 98.6 F (37 C), temperature source Oral, resp. rate 27, height 6\' 2"  (1.88 m), weight 84.1 kg (185 lb 6.5 oz), SpO2 100 %.   Intake/Output Summary (Last 24 hours) at 10/20/15 1107 Last data filed at 10/20/15 0913  Gross per 24 hour  Intake    240 ml  Output   3000 ml  Net  -2760 ml    Exam Awake Alert, Oriented x 3, No new F.N deficits, Normal affect [place].AT,PERRAL Supple Neck,No JVD, No cervical lymphadenopathy appriciated.  Symmetrical Chest wall movement, Good air movement bilaterally, CTAB RRR,No Gallops,Rubs or new Murmurs, No Parasternal Heave +ve B.Sounds, Abd Soft, Non tender, No organomegaly appriciated, No rebound -guarding or rigidity. No  Cyanosis, Clubbing or edema, No new Rash or bruise  Data Review   CBC w Diff: Lab Results  Component Value Date   WBC 8.6 10/19/2015   WBC 11.7* 01/20/2015   HGB 7.7* 10/19/2015   HGB 6.9* 01/20/2015   HCT 24.7* 10/19/2015   HCT 20.0* 06/08/2015   HCT 21.5* 01/20/2015   PLT 205 10/19/2015   PLT 176 01/20/2015   LYMPHOPCT 22 08/24/2015   LYMPHOPCT 9.0 01/20/2015   MONOPCT 17 08/24/2015   MONOPCT 7.8 01/20/2015   EOSPCT 5 08/24/2015   EOSPCT 1.7 01/20/2015   BASOPCT 1 08/24/2015   BASOPCT 0.2 01/20/2015    CMP: Lab Results  Component Value Date   NA 135 10/19/2015   NA 141 01/20/2015   K 3.9 10/19/2015   K 3.6 01/20/2015   CL 97* 10/19/2015   CL 99* 01/20/2015   CO2 28 10/19/2015   CO2 30 01/20/2015   BUN 62* 10/19/2015   BUN 32* 01/20/2015   CREATININE 3.46* 10/19/2015   CREATININE 6.73* 01/20/2015   PROT 6.4* 09/11/2015   PROT 6.5 07/04/2014   ALBUMIN 2.5* 10/19/2015   ALBUMIN 2.4* 07/06/2014   BILITOT 0.7 09/11/2015   BILITOT 0.3 07/04/2014   ALKPHOS 76 09/11/2015   ALKPHOS 139* 07/04/2014   AST 14* 09/11/2015   AST 19 07/04/2014   ALT 8* 09/11/2015   ALT 10* 07/04/2014  .   Total Time in preparing paper work, data evaluation and todays exam - 6 minutes  Linton Stolp M.D on 10/20/2015 at 11:07 AM    Note: This dictation was prepared with Dragon dictation along with smaller phrase technology. Any transcriptional errors that result from this process are unintentional.

## 2015-10-20 NOTE — Progress Notes (Addendum)
Appointment made at the [facility] on November 06, 2015 at  9:30am. Thank you.

## 2015-10-20 NOTE — Progress Notes (Signed)
[facility] present to transport patient to [facility] care. Patient alert, oriented, and in no distress at this time. Cleaned patient up and placed diaper on per his request. No other changes noted from previous assessments. Paperwork provided to [employer] and signed. Patient transported at this time.

## 2015-10-23 ENCOUNTER — Encounter: Payer: Medicare Other | Admitting: Physical Therapy

## 2015-10-25 ENCOUNTER — Encounter: Payer: Medicare Other | Admitting: Physical Therapy

## 2015-10-31 ENCOUNTER — Ambulatory Visit: Payer: Medicare Other | Admitting: Family

## 2015-11-06 ENCOUNTER — Ambulatory Visit: Payer: Medicare Other | Attending: Family | Admitting: Family

## 2015-11-06 ENCOUNTER — Encounter: Payer: Self-pay | Admitting: Family

## 2015-11-06 VITALS — BP 119/63 | HR 79 | Resp 20 | Ht 74.0 in

## 2015-11-06 DIAGNOSIS — I5032 Chronic diastolic (congestive) heart failure: Secondary | ICD-10-CM | POA: Diagnosis not present

## 2015-11-06 DIAGNOSIS — F0390 Unspecified dementia without behavioral disturbance: Secondary | ICD-10-CM | POA: Insufficient documentation

## 2015-11-06 DIAGNOSIS — Z8546 Personal history of malignant neoplasm of prostate: Secondary | ICD-10-CM | POA: Diagnosis not present

## 2015-11-06 DIAGNOSIS — Z8551 Personal history of malignant neoplasm of bladder: Secondary | ICD-10-CM | POA: Insufficient documentation

## 2015-11-06 DIAGNOSIS — R0602 Shortness of breath: Secondary | ICD-10-CM | POA: Insufficient documentation

## 2015-11-06 DIAGNOSIS — I4891 Unspecified atrial fibrillation: Secondary | ICD-10-CM | POA: Diagnosis not present

## 2015-11-06 DIAGNOSIS — D649 Anemia, unspecified: Secondary | ICD-10-CM | POA: Insufficient documentation

## 2015-11-06 DIAGNOSIS — R5383 Other fatigue: Secondary | ICD-10-CM | POA: Insufficient documentation

## 2015-11-06 DIAGNOSIS — J449 Chronic obstructive pulmonary disease, unspecified: Secondary | ICD-10-CM | POA: Diagnosis not present

## 2015-11-06 DIAGNOSIS — Z880 Allergy status to penicillin: Secondary | ICD-10-CM | POA: Diagnosis not present

## 2015-11-06 DIAGNOSIS — I723 Aneurysm of iliac artery: Secondary | ICD-10-CM | POA: Diagnosis not present

## 2015-11-06 DIAGNOSIS — E7800 Pure hypercholesterolemia, unspecified: Secondary | ICD-10-CM | POA: Insufficient documentation

## 2015-11-06 DIAGNOSIS — N184 Chronic kidney disease, stage 4 (severe): Secondary | ICD-10-CM

## 2015-11-06 DIAGNOSIS — K219 Gastro-esophageal reflux disease without esophagitis: Secondary | ICD-10-CM | POA: Insufficient documentation

## 2015-11-06 DIAGNOSIS — Z992 Dependence on renal dialysis: Secondary | ICD-10-CM | POA: Insufficient documentation

## 2015-11-06 DIAGNOSIS — N186 End stage renal disease: Secondary | ICD-10-CM | POA: Insufficient documentation

## 2015-11-06 DIAGNOSIS — Z87891 Personal history of nicotine dependence: Secondary | ICD-10-CM | POA: Diagnosis not present

## 2015-11-06 DIAGNOSIS — I5042 Chronic combined systolic (congestive) and diastolic (congestive) heart failure: Secondary | ICD-10-CM | POA: Diagnosis present

## 2015-11-06 DIAGNOSIS — I120 Hypertensive chronic kidney disease with stage 5 chronic kidney disease or end stage renal disease: Secondary | ICD-10-CM | POA: Diagnosis not present

## 2015-11-06 DIAGNOSIS — I480 Paroxysmal atrial fibrillation: Secondary | ICD-10-CM | POA: Insufficient documentation

## 2015-11-06 DIAGNOSIS — I4819 Other persistent atrial fibrillation: Secondary | ICD-10-CM

## 2015-11-06 DIAGNOSIS — Z8673 Personal history of transient ischemic attack (TIA), and cerebral infarction without residual deficits: Secondary | ICD-10-CM | POA: Insufficient documentation

## 2015-11-06 NOTE — Patient Instructions (Addendum)
Begin weighing daily and call for an overnight weight gain of > 2 pounds or a weekly weight gain of >5 pounds.  Physical therapy evaluation.  Follow up with:  Dr. Netty Starring  February 17,2017 at 10:45 am

## 2015-11-06 NOTE — Progress Notes (Signed)
Subjective:    Patient ID: [name], male    DOB: 08-10-1941, 75 y.o.   MRN: [id number]  Congestive Heart Failure Presents for follow-up visit. The disease course has been stable. Associated symptoms include fatigue and shortness of breath. Pertinent negatives include no abdominal pain, chest pain, edema, orthopnea or palpitations. The symptoms have been stable. Past treatments include salt and fluid restriction and oxygen. The treatment provided moderate relief. Compliance with prior treatments has been good. His past medical history is significant for anemia, arrhythmia, chronic lung disease and HTN. He has one 1st degree relative with heart disease. Compliance with total regimen is 76-100%.  Other This is a chronic (weakness) problem. The current episode started more than 1 month ago. The problem occurs constantly. The problem has been gradually worsening. Associated symptoms include coughing, fatigue and weakness (in bilateral lower legs). Pertinent negatives include no abdominal pain, chest pain, congestion, headaches, myalgias, neck pain or sore throat. The symptoms are aggravated by walking and standing. The treatment provided mild relief.    Past Medical History  Diagnosis Date  . PAF (paroxysmal atrial fibrillation) ([place])     a. new onset 12/2014 in the setting of UTI, sepsis, hypotension, and anemia; b. not on long term anticoagulation given anemia; c. family aware of stroke risk, they are ok with this; d. on amiodarone   . Chronic combined systolic and diastolic CHF (congestive heart failure) ([place])     a. echo 12/2014: EF 25-30%, anterior wall wall motion abnormalities; b. planned ischemic evaluation once patient is stable medically; c. echo 01/2015: EF 45-50%, no RWMA, mild AT, LA mildly dilated, mod pericardial effusion along LV free wall, no evidence of hemodynamic compromise  . ESRD on hemodialysis ([place])     a. Tuesday, Thursday, and Saturdays  . Anemia     a. baseline hgb  ~ 8  . History of small bowel obstruction     a. 01/2015  . History of septic shock     a. 01/2015  . GERD (gastroesophageal reflux disease)   . Allergic rhinitis   . COPD (chronic obstructive pulmonary disease) ([place])   . Hypercholesteremia   . Cognitive communication deficit   . Magnesium deficiency   . Dementia   . 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 Incontinence   . Hydronephrosis   . Overactive bladder   . Detrusor sphincter dyssynergia   . Mini stroke ([place])   . Bladder cancer ([place])   . Prostate cancer ([place])   . Renal insufficiency   . Hypertension     Past Surgical History  Procedure Laterality Date  . Cystoscopy w/ ureteral stent placement    . Transurethral resection of bladder tumor with gyrus (turbt-gyrus)    . Ureteral stent placement    . Prostate surgery      Removal  . Av fistula placement      left arm    Family History  Problem Relation Age of Onset  . Stroke Mother     Social History  Substance Use Topics  . Smoking status: Former Smoker -- 0.25 packs/day for 67 years    Quit date: 05/13/2015  . Smokeless tobacco: Never Used  . Alcohol Use: No    Allergies  Allergen Reactions  . Penicillins Hives, Itching and Swelling    Has patient had a PCN reaction causing immediate rash, facial/tongue/throat swelling, SOB or lightheadedness with hypotension: Yes Has patient had a PCN reaction causing severe rash involving mucus membranes or skin  necrosis: No Has patient had a PCN reaction that required hospitalization No Has patient had a PCN reaction occurring within the last 10 years: No If all of the above answers are "NO", then may proceed with Cephalosporin use.    Prior to Admission medications   Medication Sig Start Date End Date Taking? Authorizing Provider  albuterol (PROVENTIL) (2.5 MG/3ML) 0.083% nebulizer solution Take 3 mLs (2.5 mg total) by nebulization every 4 (four) hours as needed for wheezing or shortness of breath. 06/10/15  Yes Fritzi Mandes, MD   amiodarone (PACERONE) 200 MG tablet Take 1 tablet (200 mg total) by mouth daily. 09/13/15  Yes Gladstone Lighter, MD  amLODipine (NORVASC) 2.5 MG tablet Take 2.5 mg by mouth daily.   Yes Historical Provider, MD  atorvastatin (LIPITOR) 40 MG tablet Take 1 tablet (40 mg total) by mouth daily. 09/13/15  Yes Gladstone Lighter, MD  budesonide-formoterol (SYMBICORT) 160-4.5 MCG/ACT inhaler Inhale 2 puffs into the lungs 2 (two) times daily.   Yes Historical Provider, MD  ferrous sulfate 325 (65 FE) MG tablet Take 1 tablet (325 mg total) by mouth daily with breakfast. 09/13/15  Yes Gladstone Lighter, MD  guaiFENesin (MUCINEX) 600 MG 12 hr tablet Take 1 tablet (600 mg total) by mouth 2 (two) times daily. 09/13/15  Yes Glad[place] Lighter, MD  loperamide (IMODIUM) 2 MG capsule Take 2 mg by mouth as needed for diarrhea or loose stools.   Yes Historical Provider, MD  magnesium oxide (MAG-OX) 400 MG tablet Take 1 tablet (400 mg total) by mouth every evening. 09/13/15  Yes Gladstone Lighter, MD  polyethylene glycol (MIRALAX / GLYCOLAX) packet Take 17 g by mouth daily. 09/13/15  Yes [place] Lighter, MD  sevelamer carbonate (RENVELA) 800 MG tablet Take 800 mg by mouth 3 (three) times daily before meals.    Yes Historical Provider, MD  tiotropium (SPIRIVA) 18 MCG inhalation capsule Place 1 capsule (18 mcg total) into inhaler and inhale daily. 09/13/15  Yes [place] Lighter, MD     Review of Systems  Constitutional: Positive for fatigue. Negative for appetite change.  HENT: Positive for rhinorrhea. Negative for congestion and sore throat.   Eyes: Negative.   Respiratory: Positive for cough and shortness of breath. Negative for chest tightness and wheezing.   Cardiovascular: Negative for chest pain, palpitations and leg swelling.  Gastrointestinal: Negative for abdominal pain and abdominal distention.  Endocrine: Negative.   Genitourinary: Negative.   Musculoskeletal: Negative for myalgias, back pain and neck  pain.  Skin: Negative.   Allergic/Immunologic: Negative.   Neurological: Positive for weakness (in bilateral lower legs). Negative for dizziness, light-headedness and headaches.  Hematological: Negative for adenopathy. Does not bruise/bleed easily.  Psychiatric/Behavioral: Positive for sleep disturbance (not comfortable sometimes. sleeping on 1 pillow with head of bed elevated) and dysphoric mood. The patient is not nervous/anxious.        Objective:   Physical Exam  Constitutional: He is oriented to person, place, and time. He appears well-developed and well-nourished.  HENT:  Head: Normocephalic and atraumatic.  Eyes: Conjunctivae are normal. Pupils are equal, round, and reactive to light.  Neck: Normal range of motion. Neck supple.  Cardiovascular: Normal rate.  An irregular rhythm present.  Pulmonary/Chest: Effort normal. He has no wheezes. He has no rales.  Abdominal: Soft. He exhibits no distension. There is no tenderness.  Musculoskeletal: He exhibits no edema or tenderness.  Neurological: He is alert and oriented to person, place, and time.  Skin: Skin is warm and dry.  Psychiatric:  He has a normal mood and affect. His behavior is normal. Thought content normal.  Nursing note and vitals reviewed.   BP 119/63 mmHg  Pulse 79  Resp 20  Ht 6\' 2"  (1.88 m)  SpO2 98%       Assessment & Plan:  1: Chronic heart failure with preserved ejection fraction- Patient presents with fatigue and shortness of breath upon exertion. He says that he even feels a little short of breath at rest even with his oxygen on. He is currently living at a SNF and gets weighed only on occasion. An order was written for him to start being weighed on a daily basis and for the facility to call for an overnight weight gain of >2 pounds or a weekly weight gain of >5 pounds. He does not add salt to his food and says that he doesn't think the facility cooks with salt. He was unable to stand to be weighed due to  his leg weakness. He was concerned about the weakness because he thinks that it's getting worse and he can no longer transfer himself from the bed to the chair or the commode without assistance. He says that his physical therapy was recently cancelled by his insurance and he's put an appeal into the insurance company.  Order written for physical therapy evaluation to be done.  2: Atrial fibrillation- Currently rate controlled at this time. Continues to take amiodarone. 3: COPD- No wheezing heard at this time. Continues to wear oxygen along with using his inhalers. Sees his PCP on 11/10/15. 4: Chronic kidney disease- Currently receives dialysis on T, TH, Sat.   Return here in 2 months or sooner for any questions/problems before then.

## 2015-11-08 ENCOUNTER — Inpatient Hospital Stay: Payer: Medicare Other

## 2015-11-08 ENCOUNTER — Inpatient Hospital Stay: Payer: Medicare Other | Attending: Oncology | Admitting: Oncology

## 2015-11-08 VITALS — BP 125/68 | HR 74 | Temp 98.6°F | Wt 189.0 lb

## 2015-11-08 DIAGNOSIS — K219 Gastro-esophageal reflux disease without esophagitis: Secondary | ICD-10-CM | POA: Diagnosis not present

## 2015-11-08 DIAGNOSIS — C61 Malignant neoplasm of prostate: Secondary | ICD-10-CM

## 2015-11-08 DIAGNOSIS — R5382 Chronic fatigue, unspecified: Secondary | ICD-10-CM | POA: Diagnosis not present

## 2015-11-08 DIAGNOSIS — Z8551 Personal history of malignant neoplasm of bladder: Secondary | ICD-10-CM | POA: Diagnosis not present

## 2015-11-08 DIAGNOSIS — N186 End stage renal disease: Secondary | ICD-10-CM | POA: Insufficient documentation

## 2015-11-08 DIAGNOSIS — I5042 Chronic combined systolic (congestive) and diastolic (congestive) heart failure: Secondary | ICD-10-CM | POA: Diagnosis not present

## 2015-11-08 DIAGNOSIS — Z7901 Long term (current) use of anticoagulants: Secondary | ICD-10-CM | POA: Insufficient documentation

## 2015-11-08 DIAGNOSIS — J449 Chronic obstructive pulmonary disease, unspecified: Secondary | ICD-10-CM | POA: Diagnosis not present

## 2015-11-08 DIAGNOSIS — Z87891 Personal history of nicotine dependence: Secondary | ICD-10-CM | POA: Insufficient documentation

## 2015-11-08 DIAGNOSIS — Z79818 Long term (current) use of other agents affecting estrogen receptors and estrogen levels: Secondary | ICD-10-CM | POA: Insufficient documentation

## 2015-11-08 DIAGNOSIS — Z880 Allergy status to penicillin: Secondary | ICD-10-CM | POA: Diagnosis not present

## 2015-11-08 DIAGNOSIS — Z8673 Personal history of transient ischemic attack (TIA), and cerebral infarction without residual deficits: Secondary | ICD-10-CM | POA: Insufficient documentation

## 2015-11-08 DIAGNOSIS — I480 Paroxysmal atrial fibrillation: Secondary | ICD-10-CM | POA: Diagnosis not present

## 2015-11-08 DIAGNOSIS — D631 Anemia in chronic kidney disease: Secondary | ICD-10-CM | POA: Diagnosis not present

## 2015-11-08 DIAGNOSIS — Z992 Dependence on renal dialysis: Secondary | ICD-10-CM | POA: Diagnosis not present

## 2015-11-08 DIAGNOSIS — D649 Anemia, unspecified: Secondary | ICD-10-CM

## 2015-11-08 DIAGNOSIS — R531 Weakness: Secondary | ICD-10-CM | POA: Diagnosis not present

## 2015-11-08 DIAGNOSIS — C7951 Secondary malignant neoplasm of bone: Secondary | ICD-10-CM | POA: Diagnosis not present

## 2015-11-08 DIAGNOSIS — Z79899 Other long term (current) drug therapy: Secondary | ICD-10-CM | POA: Insufficient documentation

## 2015-11-08 DIAGNOSIS — I120 Hypertensive chronic kidney disease with stage 5 chronic kidney disease or end stage renal disease: Secondary | ICD-10-CM | POA: Insufficient documentation

## 2015-11-08 LAB — CBC WITH DIFFERENTIAL/PLATELET
BASOS ABS: 0 10*3/uL (ref 0–0.1)
Basophils Relative: 1 %
EOS ABS: 0.3 10*3/uL (ref 0–0.7)
EOS PCT: 5 %
HCT: 24.1 % — ABNORMAL LOW (ref 40.0–52.0)
HEMOGLOBIN: 7.5 g/dL — AB (ref 13.0–18.0)
Lymphocytes Relative: 20 %
Lymphs Abs: 1.2 10*3/uL (ref 1.0–3.6)
MCH: 26.5 pg (ref 26.0–34.0)
MCHC: 31 g/dL — ABNORMAL LOW (ref 32.0–36.0)
MCV: 85.3 fL (ref 80.0–100.0)
Monocytes Absolute: 0.8 10*3/uL (ref 0.2–1.0)
Monocytes Relative: 13 %
NEUTROS PCT: 61 %
Neutro Abs: 3.5 10*3/uL (ref 1.4–6.5)
PLATELETS: 241 10*3/uL (ref 150–440)
RBC: 2.82 MIL/uL — AB (ref 4.40–5.90)
RDW: 17.1 % — ABNORMAL HIGH (ref 11.5–14.5)
WBC: 5.8 10*3/uL (ref 3.8–10.6)

## 2015-11-08 LAB — BASIC METABOLIC PANEL
Anion gap: 7 (ref 5–15)
BUN: 49 mg/dL — AB (ref 6–20)
CALCIUM: 8.2 mg/dL — AB (ref 8.9–10.3)
CO2: 33 mmol/L — AB (ref 22–32)
CREATININE: 3.85 mg/dL — AB (ref 0.61–1.24)
Chloride: 100 mmol/L — ABNORMAL LOW (ref 101–111)
GFR calc Af Amer: 16 mL/min — ABNORMAL LOW (ref >60)
GFR, EST NON AFRICAN AMERICAN: 14 mL/min — AB (ref >60)
GLUCOSE: 122 mg/dL — AB (ref 65–99)
Potassium: 3.5 mmol/L (ref 3.5–5.1)
Sodium: 140 mmol/L (ref 135–145)

## 2015-11-08 LAB — IRON AND TIBC
IRON: 20 ug/dL — AB (ref 45–182)
SATURATION RATIOS: 10 % — AB (ref 17.9–39.5)
TIBC: 209 ug/dL — AB (ref 250–450)
UIBC: 189 ug/dL

## 2015-11-08 LAB — FERRITIN: FERRITIN: 764 ng/mL — AB (ref 24–336)

## 2015-11-08 MED ORDER — LEUPROLIDE ACETATE (6 MONTH) 45 MG IM KIT
45.0000 mg | PACK | Freq: Once | INTRAMUSCULAR | Status: AC
  Administered 2015-11-08: 45 mg via INTRAMUSCULAR
  Filled 2015-11-08: qty 45

## 2015-11-08 NOTE — Progress Notes (Signed)
Patient brought to exam room 7 via wheelchair.  Patient denies pain or discomfort, vitals stable and documented.  Patient unable to rely what medications he is currently taking and did not bring a list of medication or  Medication bottles..  Dr Grayland Ormond notified

## 2015-11-09 LAB — PSA: PSA: 2 ng/mL (ref 0.00–4.00)

## 2015-11-15 ENCOUNTER — Ambulatory Visit
Admission: RE | Admit: 2015-11-15 | Discharge: 2015-11-15 | Disposition: A | Discharge status: Home / Self Care | Payer: Medicare Other | Source: Ambulatory Visit | Attending: Nephrology | Admitting: Nephrology

## 2015-11-15 DIAGNOSIS — A4902 Methicillin resistant Staphylococcus aureus infection, unspecified site: Secondary | ICD-10-CM | POA: Diagnosis not present

## 2015-11-15 LAB — PREPARE RBC (CROSSMATCH)

## 2015-11-15 MED ORDER — SODIUM CHLORIDE 0.9 % IV SOLN
Freq: Once | INTRAVENOUS | Status: AC
  Administered 2015-11-15: 09:00:00 via INTRAVENOUS

## 2015-11-16 LAB — TYPE AND SCREEN
ABO/RH(D): A NEG
Antibody Screen: NEGATIVE
Unit division: 0

## 2015-11-18 NOTE — Progress Notes (Signed)
[facility] day:  06/23/2015  Chief Complaint: [name] is a 75 y.o. male with end stage renal disease on dialysis, prostate and bladder cancer, who is seen for laboratory work and continuation of Lupron.   HPI: Patient returns to clinic today for further evaluation and continuation of Lupron. He continues with dialysis on Tuesdays, Thursdays, and Saturdays. He has chronic weakness and fatigue that is unchanged. He denies any pain. He has no neurologic complaints. He denies any chest pain or shortness of breath. He denies any nausea, vomiting, constipation, or diarrhea. Patient offers no specific complaints today.     Past Medical History  Diagnosis Date  . PAF (paroxysmal atrial fibrillation) ([place])     a. new onset 12/2014 in the setting of UTI, sepsis, hypotension, and anemia; b. not on long term anticoagulation given anemia; c. family aware of stroke risk, they are ok with this; d. on amiodarone   . Chronic combined systolic and diastolic CHF (congestive heart failure) ([place])     a. echo 12/2014: EF 25-30%, anterior wall wall motion abnormalities; b. planned ischemic evaluation once patient is stable medically; c. echo 01/2015: EF 45-50%, no RWMA, mild AT, LA mildly dilated, mod pericardial effusion along LV free wall, no evidence of hemodynamic compromise  . ESRD on hemodialysis ([place])     a. Tuesday, Thursday, and Saturdays  . Anemia     a. baseline hgb ~ 8  . History of small bowel obstruction     a. 01/2015  . History of septic shock     a. 01/2015  . GERD (gastroesophageal reflux disease)   . Allergic rhinitis   . COPD (chronic obstructive pulmonary disease) ([place])   . Hypercholesteremia   . Cognitive communication deficit   . Magnesium deficiency   . Dementia   . 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 Incontinence   . Hydronephrosis   . Overactive bladder   . Detrusor sphincter dyssynergia   . Mini stroke ([place])   . Bladder cancer ([place])    . Prostate cancer ([place])   . Renal insufficiency   . Hypertension     Past Surgical History  Procedure Laterality Date  . Cystoscopy w/ ureteral stent placement    . Transurethral resection of bladder tumor with gyrus (turbt-gyrus)    . Ureteral stent placement    . Prostate surgery      Removal  . Av fistula placement      left arm    Family History  Problem Relation Age of Onset  . Stroke Mother     Social History:  reports that he quit smoking about 6 months ago. He has never used smokeless tobacco. He reports that he does not drink alcohol or use illicit drugs.  The patient is alone today.  Transportation was provided by [employer] transportation.  Allergies:  Allergies  Allergen Reactions  . Penicillins Hives, Itching and Swelling    Has patient had a PCN reaction causing immediate rash, facial/tongue/throat swelling, SOB or lightheadedness with hypotension: Yes Has patient had a PCN reaction causing severe rash involving mucus membranes or skin necrosis: No Has patient had a PCN reaction that required hospitalization No Has patient had a PCN reaction occurring within the last 10 years: No If all of the above answers are "NO", then may proceed with Cephalosporin use.    Current Medications: Current Outpatient Prescriptions  Medication Sig Dispense Refill  . albuterol (PROVENTIL) (2.5 MG/3ML) 0.083% nebulizer  solution Take 3 mLs (2.5 mg total) by nebulization every 4 (four) hours as needed for wheezing or shortness of breath. 75 mL 12  . amiodarone (PACERONE) 200 MG tablet Take 1 tablet (200 mg total) by mouth daily. 30 tablet 5  . amLODipine (NORVASC) 2.5 MG tablet Take 2.5 mg by mouth daily.    Marland Kitchen atorvastatin (LIPITOR) 40 MG tablet Take 1 tablet (40 mg total) by mouth daily. 30 tablet 1  . budesonide-formoterol (SYMBICORT) 160-4.5 MCG/ACT inhaler Inhale 2 puffs into the lungs 2 (two) times daily.    . ferrous sulfate 325 (65 FE) MG tablet Take 1 tablet (325 mg total) by  mouth daily with breakfast. 90 tablet 0  . guaiFENesin (MUCINEX) 600 MG 12 hr tablet Take 1 tablet (600 mg total) by mouth 2 (two) times daily. 20 tablet 2  . loperamide (IMODIUM) 2 MG capsule Take 2 mg by mouth as needed for diarrhea or loose stools.    . magnesium oxide (MAG-OX) 400 MG tablet Take 1 tablet (400 mg total) by mouth every evening. 30 tablet 2  . polyethylene glycol (MIRALAX / GLYCOLAX) packet Take 17 g by mouth daily. 14 each 0  . sevelamer carbonate (RENVELA) 800 MG tablet Take 800 mg by mouth 3 (three) times daily before meals.     . tiotropium (SPIRIVA) 18 MCG inhalation capsule Place 1 capsule (18 mcg total) into inhaler and inhale daily. 30 capsule 12   No current facility-administered medications for this visit.    Review of Systems:  GENERAL:  No fevers, sweats or weight loss. PERFORMANCE STATUS (ECOG):  3 HEENT:  Poor vision.  No runny nose, sore throat, mouth sores or tenderness. Lungs: Periodic shortness of breath.  No cough.  No hemoptysis. Cardiac:  No chest pain, palpitations, orthopnea, or PND. GI:  Diarrhea, constipation.  Dark stools.  No abdominal pain.  No nausea, vomiting, or hematochezia. GU:  No urgency, frequency, dysuria, or hematuria. Musculoskeletal:  No back pain.  No joint pain.  No muscle tenderness. Extremities:  No pain or swelling. Skin:  No rashes or skin changes. Neuro:  No headache, numbness or weakness, balance or coordination issues. Endocrine:  No diabetes, thyroid issues, hot flashes or night sweats. Psych:  No mood changes, depression or anxiety. Pain:  No focal pain. Review of systems:  All other systems reviewed and found to be negative.  Physical Exam: Blood pressure 125/68, pulse 74, temperature 98.6 F (37 C), temperature source Tympanic, weight 189 lb (85.73 kg), SpO2 99 %.   General: Well-developed, well-nourished, no acute distress. Sitting in a wheelchair. Eyes: Pink conjunctiva, anicteric sclera. HEENT: Normocephalic,  moist mucous membranes, clear oropharnyx. Lungs: Clear to auscultation bilaterally. Heart: Regular rate and rhythm. No rubs, murmurs, or gallops. Abdomen: Soft, nontender, nondistended. No organomegaly noted, normoactive bowel sounds. Musculoskeletal: No edema, cyanosis, or clubbing. Neuro: Alert, answering all questions appropriately. Cranial nerves grossly intact. Skin: No rashes or petechiae noted. Psych: Normal affect.   Appointment on 11/08/2015  Component Date Value Ref Range Status  . WBC 11/08/2015 5.8  3.8 - 10.6 K/uL Final  . RBC 11/08/2015 2.82* 4.40 - 5.90 MIL/uL Final  . Hemoglobin 11/08/2015 7.5* 13.0 - 18.0 g/dL Final  . HCT 11/08/2015 24.1* 40.0 - 52.0 % Final  . MCV 11/08/2015 85.3  80.0 - 100.0 fL Final  . MCH 11/08/2015 26.5  26.0 - 34.0 pg Final  . MCHC 11/08/2015 31.0* 32.0 - 36.0 g/dL Final  . RDW 11/08/2015 17.1* 11.5 -  14.5 % Final  . Platelets 11/08/2015 241  150 - 440 K/uL Final  . Neutrophils Relative % 11/08/2015 61   Final  . Neutro Abs 11/08/2015 3.5  1.4 - 6.5 K/uL Final  . Lymphocytes Relative 11/08/2015 20   Final  . Lymphs Abs 11/08/2015 1.2  1.0 - 3.6 K/uL Final  . Monocytes Relative 11/08/2015 13   Final  . Monocytes Absolute 11/08/2015 0.8  0.2 - 1.0 K/uL Final  . Eosinophils Relative 11/08/2015 5   Final  . Eosinophils Absolute 11/08/2015 0.3  0 - 0.7 K/uL Final  . Basophils Relative 11/08/2015 1   Final  . Basophils Absolute 11/08/2015 0.0  0 - 0.1 K/uL Final  . Ferritin 11/08/2015 764* 24 - 336 ng/mL Final  . Iron 11/08/2015 20* 45 - 182 ug/dL Final  . TIBC 41/75/3010 209* 250 - 450 ug/dL Final  . Saturation Ratios 11/08/2015 10* 17.9 - 39.5 % Final  . UIBC 11/08/2015 189   Final  . Sodium 11/08/2015 140  135 - 145 mmol/L Final  . Potassium 11/08/2015 3.5  3.5 - 5.1 mmol/L Final  . Chloride 11/08/2015 100* 101 - 111 mmol/L Final  . CO2 11/08/2015 33* 22 - 32 mmol/L Final  . Glucose, Bld 11/08/2015 122* 65 - 99 mg/dL Final  . BUN  40/45/9136 49* 6 - 20 mg/dL Final  . Creatinine, Ser 11/08/2015 3.85* 0.61 - 1.24 mg/dL Final  . Calcium 85/99/2341 8.2* 8.9 - 10.3 mg/dL Final  . GFR calc non Af Amer 11/08/2015 14* >60 mL/min Final  . GFR calc Af Amer 11/08/2015 16* >60 mL/min Final   Comment: (NOTE) The eGFR has been calculated using the CKD EPI equation. This calculation has not been validated in all clinical situations. eGFR's persistently <60 mL/min signify possible Chronic Kidney Disease.   . Anion gap 11/08/2015 7  5 - 15 Final  . PSA 11/08/2015 2.00  0.00 - 4.00 ng/mL Final   Comment: (NOTE) While PSA levels of <=4.0 ng/ml are reported as reference range, some men with levels below 4.0 ng/ml can have prostate cancer and many men with PSA above 4.0 ng/ml do not have prostate cancer.  Other tests such as free PSA, age specific reference ranges, PSA velocity and PSA doubling time may be helpful especially in men less than 21 years old. Performed at [facility]   Outside labs from the dialysis center on 06/20/2015 revealed a hematocrit of 23.4 and hemoglobin of 7.8   Assessment:  [name] is a 75 y.o. male with end stage renal disease on dialysis since 06/2014.  The etiology of his renal disease is unclear.  He has stage IV prostate cancer.  PET scan on 06/2011 had revealed lymphadenopathy with sclerotic bone metastasis consistent with probable prostate cancer. There was bilateral hydronephrosis with bladder base thickening from probable prostate ingrowth. Bone scan 07/30/2011 revealed multiple areas consistent with metastatic disease.  He receives Lupron every 6 months.  PSA is essentially stable at 2.0. He last received Lupron on February 27, 2015, therefore will proceed with 45 mg intramuscular Lupron today.   He has a history of bladder cancer and is status post transurethral resection of bladder tumor (TURBT). He has stents in place.  He denies any hematuria.  He had a colonoscopy in 2012 which  was negative. He is not felt to be a good candidate for sedation (further endoscopies) secondary to his multiple morbidities.  Labs on 06/07/2014 revealed a ferritin of 6660 (prior value  of 838 on 05/04/2015). Sedimentation rate was 80. Folate was 20.  Reticulocyte count was 3.4%, TSH was 1.07, ANA was negative.  Haptoglobin was 225 (normal), LDH 203 (98-192).  B12 was normal on 05/04/2015.  Anemia: Likely multifactorial. Patient by report receives Procrit with dialysis. Iron stores drawn today which reveal mild iron deficiency and he may benefit from IV Feraheme in the future.   Plan:  Return to clinic in 6 months with repeat Lupron and further evaluation.   Lloyd Huger, MD  06/23/2015, 11:44 AM

## 2015-11-23 ENCOUNTER — Emergency Department: Payer: Medicare Other

## 2015-11-23 ENCOUNTER — Emergency Department
Admission: EM | Admit: 2015-11-23 | Discharge: 2015-11-23 | Disposition: A | Discharge status: Home / Self Care | Payer: Medicare Other | Attending: Emergency Medicine | Admitting: Emergency Medicine

## 2015-11-23 ENCOUNTER — Encounter: Payer: Self-pay | Admitting: Emergency Medicine

## 2015-11-23 DIAGNOSIS — N186 End stage renal disease: Secondary | ICD-10-CM | POA: Insufficient documentation

## 2015-11-23 DIAGNOSIS — Z992 Dependence on renal dialysis: Secondary | ICD-10-CM | POA: Diagnosis not present

## 2015-11-23 DIAGNOSIS — Z880 Allergy status to penicillin: Secondary | ICD-10-CM | POA: Insufficient documentation

## 2015-11-23 DIAGNOSIS — R197 Diarrhea, unspecified: Secondary | ICD-10-CM

## 2015-11-23 DIAGNOSIS — Z79899 Other long term (current) drug therapy: Secondary | ICD-10-CM | POA: Diagnosis not present

## 2015-11-23 DIAGNOSIS — Z7951 Long term (current) use of inhaled steroids: Secondary | ICD-10-CM | POA: Diagnosis not present

## 2015-11-23 DIAGNOSIS — R112 Nausea with vomiting, unspecified: Secondary | ICD-10-CM | POA: Diagnosis present

## 2015-11-23 DIAGNOSIS — J101 Influenza due to other identified influenza virus with other respiratory manifestations: Secondary | ICD-10-CM | POA: Insufficient documentation

## 2015-11-23 DIAGNOSIS — I120 Hypertensive chronic kidney disease with stage 5 chronic kidney disease or end stage renal disease: Secondary | ICD-10-CM | POA: Diagnosis not present

## 2015-11-23 DIAGNOSIS — Z87891 Personal history of nicotine dependence: Secondary | ICD-10-CM | POA: Diagnosis not present

## 2015-11-23 LAB — BLOOD GAS, ARTERIAL
ACID-BASE EXCESS: 7.2 mmol/L — AB (ref 0.0–3.0)
Allens test (pass/fail): POSITIVE — AB
Bicarbonate: 31.2 mEq/L — ABNORMAL HIGH (ref 21.0–28.0)
FIO2: 0.28
O2 Saturation: 98.8 %
PCO2 ART: 41 mmHg (ref 32.0–48.0)
PH ART: 7.49 — AB (ref 7.350–7.450)
Patient temperature: 37
pO2, Arterial: 114 mmHg — ABNORMAL HIGH (ref 83.0–108.0)

## 2015-11-23 LAB — CBC WITH DIFFERENTIAL/PLATELET
BASOS ABS: 0 10*3/uL (ref 0–0.1)
Basophils Relative: 0 %
Eosinophils Absolute: 0 10*3/uL (ref 0–0.7)
Eosinophils Relative: 1 %
HEMATOCRIT: 29.3 % — AB (ref 40.0–52.0)
HEMOGLOBIN: 9.3 g/dL — AB (ref 13.0–18.0)
LYMPHS PCT: 7 %
Lymphs Abs: 0.6 10*3/uL — ABNORMAL LOW (ref 1.0–3.6)
MCH: 26.5 pg (ref 26.0–34.0)
MCHC: 31.7 g/dL — ABNORMAL LOW (ref 32.0–36.0)
MCV: 83.6 fL (ref 80.0–100.0)
MONO ABS: 0.7 10*3/uL (ref 0.2–1.0)
MONOS PCT: 9 %
NEUTROS ABS: 6.3 10*3/uL (ref 1.4–6.5)
NEUTROS PCT: 83 %
Platelets: 245 10*3/uL (ref 150–440)
RBC: 3.5 MIL/uL — ABNORMAL LOW (ref 4.40–5.90)
RDW: 18.2 % — AB (ref 11.5–14.5)
WBC: 7.7 10*3/uL (ref 3.8–10.6)

## 2015-11-23 LAB — COMPREHENSIVE METABOLIC PANEL
ALBUMIN: 3 g/dL — AB (ref 3.5–5.0)
ALT: 9 U/L — AB (ref 17–63)
AST: 20 U/L (ref 15–41)
Alkaline Phosphatase: 89 U/L (ref 38–126)
Anion gap: 10 (ref 5–15)
BUN: 49 mg/dL — AB (ref 6–20)
CHLORIDE: 98 mmol/L — AB (ref 101–111)
CO2: 31 mmol/L (ref 22–32)
CREATININE: 2.66 mg/dL — AB (ref 0.61–1.24)
Calcium: 8.5 mg/dL — ABNORMAL LOW (ref 8.9–10.3)
GFR calc Af Amer: 26 mL/min — ABNORMAL LOW (ref >60)
GFR calc non Af Amer: 22 mL/min — ABNORMAL LOW (ref >60)
GLUCOSE: 99 mg/dL (ref 65–99)
POTASSIUM: 3.4 mmol/L — AB (ref 3.5–5.1)
SODIUM: 139 mmol/L (ref 135–145)
Total Bilirubin: 0.8 mg/dL (ref 0.3–1.2)
Total Protein: 7.1 g/dL (ref 6.5–8.1)

## 2015-11-23 LAB — RAPID INFLUENZA A&B ANTIGENS: Influenza A (ARMC): NEGATIVE

## 2015-11-23 LAB — LIPASE, BLOOD: Lipase: 48 U/L (ref 11–51)

## 2015-11-23 LAB — LACTIC ACID, PLASMA: LACTIC ACID, VENOUS: 1 mmol/L (ref 0.5–2.0)

## 2015-11-23 LAB — RAPID INFLUENZA A&B ANTIGENS (ARMC ONLY): INFLUENZA B (ARMC): POSITIVE — AB

## 2015-11-23 LAB — TROPONIN I: Troponin I: 0.04 ng/mL — ABNORMAL HIGH (ref <0.031)

## 2015-11-23 MED ORDER — SODIUM CHLORIDE 0.9 % IV BOLUS (SEPSIS)
500.0000 mL | Freq: Once | INTRAVENOUS | Status: AC
  Administered 2015-11-23: 500 mL via INTRAVENOUS

## 2015-11-23 MED ORDER — LOPERAMIDE HCL 2 MG PO TABS: 4.0000 mg | ORAL_TABLET | Freq: Four times a day (QID) | ORAL | Status: DC | PRN

## 2015-11-23 MED ORDER — ONDANSETRON 8 MG PO TBDP: 8.0000 mg | ORAL_TABLET | Freq: Three times a day (TID) | ORAL | Status: DC | PRN

## 2015-11-23 NOTE — ED Notes (Signed)
Dr. Joni Fears notified of troponin 0.04

## 2015-11-23 NOTE — ED Notes (Signed)
Patient transported to X-ray 

## 2015-11-23 NOTE — ED Notes (Signed)
Pt incontinent of stool. Pt cleaned of large amount of diarrhea. Pt changed into clean gown and clean bedding applied.

## 2015-11-23 NOTE — ED Provider Notes (Signed)
[facility] Emergency Department Provider Note  ____________________________________________  Time seen: 9:20 AM on arrival by EMS  I have reviewed the triage vital signs and the nursing notes.   HISTORY  Chief Complaint Nausea; Emesis; and Diarrhea    HPI [name] is a 75 y.o. male brought to the ED from dialysis due to nausea vomiting and diarrhea during the session. The patient requested be transported to the emergency department for evaluation. He completed about 2 hours out of his 3 hour session. Currently happened 2 days ago during his last dialysis session. Patient also just describes generalized weakness, nausea, and nonproductive cough. No chest pain shortness of breath dizziness or syncope.     Past Medical History  Diagnosis Date  . PAF (paroxysmal atrial fibrillation) ([place])     a. new onset 12/2014 in the setting of UTI, sepsis, hypotension, and anemia; b. not on long term anticoagulation given anemia; c. family aware of stroke risk, they are ok with this; d. on amiodarone   . Chronic combined systolic and diastolic CHF (congestive heart failure) ([place])     a. echo 12/2014: EF 25-30%, anterior wall wall motion abnormalities; b. planned ischemic evaluation once patient is stable medically; c. echo 01/2015: EF 45-50%, no RWMA, mild AT, LA mildly dilated, mod pericardial effusion along LV free wall, no evidence of hemodynamic compromise  . ESRD on hemodialysis ([place])     a. Tuesday, Thursday, and Saturdays  . Anemia     a. baseline hgb ~ 8  . History of small bowel obstruction     a. 01/2015  . History of septic shock     a. 01/2015  . GERD (gastroesophageal reflux disease)   . Allergic rhinitis   . COPD (chronic obstructive pulmonary disease) ([place])   . Hypercholesteremia   . Cognitive communication deficit   . Magnesium deficiency   . Dementia   . 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 Incontinence   . Hydronephrosis   . Overactive bladder   .  Detrusor sphincter dyssynergia   . Mini stroke ([place])   . Bladder cancer ([place])   . Prostate cancer ([place])   . Renal insufficiency   . Hypertension      Patient Active Problem List   Diagnosis Date Noted  . COPD (chronic obstructive pulmonary disease) with chronic bronchitis ([place]) 11/06/2015  . Chronic diastolic heart failure ([place]) 08/11/2015  . Fluid overload 07/23/2015  . Generalized weakness 05/08/2015  . Other emphysema ([place]) 05/08/2015  . Tobacco use disorder 05/08/2015  . Hypokalemia 05/08/2015  . Pressure ulcer 05/05/2015  . Symptomatic anemia 05/04/2015  . CAFL (chronic airflow limitation) ([place]) 04/10/2015  . Malnutrition of moderate degree ([place]) 03/22/2015  . Pneumonia 03/20/2015  . History of sepsis 02/27/2015  . Incontinence 02/27/2015  . Persistent atrial fibrillation ([place]) 02/27/2015  . Preoperative cardiovascular examination 02/27/2015  . ESRD on hemodialysis ([place])   . Anemia   . History of small bowel obstruction   . Atrial fibrillation with RVR ([place]) 01/21/2015  . Chronic kidney disease (CKD), stage IV (severe) ([place]) 03/01/2014  . Essential (primary) hypertension 03/01/2014  . 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 Cystitis 11/11/2012  . Hydronephrosis 08/27/2012  . Bladder cancer ([place]) 08/27/2012  . CA of prostate ([place]) 08/27/2012  . Bone metastases ([place]) 08/27/2012     Past Surgical History  Procedure Laterality Date  . Cystoscopy w/ ureteral stent placement    . Transurethral resection of bladder tumor with gyrus (turbt-gyrus)    .  Ureteral stent placement    . Prostate surgery      Removal  . Av fistula placement      left arm     Current Outpatient Rx  Name  Route  Sig  Dispense  Refill  . acetaminophen (TYLENOL) 325 MG tablet   Oral   Take 650 mg by mouth every 4 (four) hours as needed. As needed for temp 100F or above         . acetaminophen (TYLENOL) 325 MG tablet   Oral   Take 650 mg by mouth every 4 (four) hours as needed. As needed  for pain         . albuterol (PROVENTIL HFA;VENTOLIN HFA) 108 (90 Base) MCG/ACT inhaler   Inhalation   Inhale 2 puffs into the lungs every 4 (four) hours as needed for wheezing or shortness of breath.         Marland Kitchen albuterol (PROVENTIL) (2.5 MG/3ML) 0.083% nebulizer solution   Nebulization   Take 3 mLs (2.5 mg total) by nebulization every 4 (four) hours as needed for wheezing or shortness of breath.   75 mL   12   . amiodarone (PACERONE) 200 MG tablet   Oral   Take 1 tablet (200 mg total) by mouth daily.   30 tablet   5   . amLODipine (NORVASC) 2.5 MG tablet   Oral   Take 2.5 mg by mouth daily.         Marland Kitchen atorvastatin (LIPITOR) 40 MG tablet   Oral   Take 1 tablet (40 mg total) by mouth daily. Patient taking differently: Take 40 mg by mouth at bedtime.    30 tablet   1   . b complex-vitamin c-folic acid (NEPHRO-VITE) 0.8 MG TABS tablet   Oral   Take 1 tablet by mouth daily.         . budesonide-formoterol (SYMBICORT) 160-4.5 MCG/ACT inhaler   Inhalation   Inhale 2 puffs into the lungs 2 (two) times daily.         . calcium carbonate (TUMS - DOSED IN MG ELEMENTAL CALCIUM) 500 MG chewable tablet   Oral   Chew 1 tablet by mouth as needed for indigestion or heartburn.         . ferrous sulfate 325 (65 FE) MG tablet   Oral   Take 1 tablet (325 mg total) by mouth daily with breakfast.   90 tablet   0   . guaiFENesin (MUCINEX) 600 MG 12 hr tablet   Oral   Take 1 tablet (600 mg total) by mouth 2 (two) times daily.   20 tablet   2   . magnesium oxide (MAG-OX) 400 MG tablet   Oral   Take 1 tablet (400 mg total) by mouth every evening.   30 tablet   2   . pantoprazole (PROTONIX) 40 MG tablet   Oral   Take 40 mg by mouth 2 (two) times daily.         . polyethylene glycol (MIRALAX / GLYCOLAX) packet   Oral   Take 17 g by mouth daily.   14 each   0   . sevelamer carbonate (RENVELA) 800 MG tablet   Oral   Take 800 mg by mouth 3 (three) times daily  before meals.          . tiotropium (SPIRIVA) 18 MCG inhalation capsule   Inhalation   Place 1 capsule (18 mcg total) into inhaler and inhale daily.  30 capsule   12   . diphenhydrAMINE (BENADRYL) 25 MG tablet   Oral   Take 25 mg by mouth as needed.         . loperamide (IMODIUM A-D) 2 MG tablet   Oral   Take 2 tablets (4 mg total) by mouth 4 (four) times daily as needed for diarrhea or loose stools.   30 tablet   0   . ondansetron (ZOFRAN ODT) 8 MG disintegrating tablet   Oral   Take 1 tablet (8 mg total) by mouth every 8 (eight) hours as needed for nausea or vomiting.   20 tablet   0      Allergies Penicillins   Family History  Problem Relation Age of Onset  . Stroke Mother     Social History Social History  Substance Use Topics  . Smoking status: Former Smoker -- 0.25 packs/day for 33 years    Quit date: 05/13/2015  . Smokeless tobacco: Never Used  . Alcohol Use: No    Review of Systems  Constitutional:   No fever or chills. No weight changes Eyes:   No blurry vision or double vision.  ENT:   No sore throat.  Cardiovascular:   No chest pain. Respiratory:   No dyspnea or cough. Gastrointestinal:   Positive mild generalized abdominal pain, vomiting and diarrhea..  No BRBPR or melena. Genitourinary:   Secondary anuria Due to end-stage renal disease Musculoskeletal:   Negative for back pain. No joint swelling or pain. Skin:   Negative for rash. Neurological:   Negative for headaches, focal weakness or numbness. Psychiatric:  No anxiety or depression.   Endocrine:  No changes in energy or sleep difficulty.  10-point ROS otherwise negative.  ____________________________________________   PHYSICAL EXAM:  VITAL SIGNS: ED Triage Vitals  Enc Vitals Group     BP 11/23/15 0929 111/69 mmHg     Pulse Rate 11/23/15 0929 68     Resp 11/23/15 0929 18     Temp 11/23/15 0929 98.3 F (36.8 C)     Temp Source 11/23/15 0929 Axillary     SpO2 11/23/15  0929 98 %     Weight 11/23/15 0929 189 lb 8 oz (85.957 kg)     Height 11/23/15 0929 6\' 2"  (1.88 m)     Head Cir --      Peak Flow --      Pain Score 11/23/15 0930 6     Pain Loc --      Pain Edu? --      Excl. in [place]? --     Vital signs reviewed, nursing assessments reviewed.   Constitutional:   Alert and oriented. Well appearing and in no distress. Eyes:   No scleral icterus. No conjunctival pallor. PERRL. EOMI ENT   Head:   Normocephalic and atraumatic.   Nose:   No congestion/rhinnorhea. No septal hematoma   Mouth/Throat:   Dry mucous membranes, no pharyngeal erythema. No peritonsillar mass.    Neck:   No stridor. No SubQ emphysema. No meningismus. Hematological/Lymphatic/Immunilogical:   No cervical lymphadenopathy. Cardiovascular:   RRR. Symmetric bilateral radial and DP pulses.  No murmurs.  Respiratory:   Normal respiratory effort without tachypnea nor retractions. Scant rhonchi in the right base. Good air entry diffusely. Gastrointestinal:   Soft and nontender. Non distended. There is no CVA tenderness.  No rebound, rigidity, or guarding. Genitourinary:   deferred Musculoskeletal:   Nontender with normal range of motion in all extremities. No joint effusions.  No lower extremity tenderness.  No edema. Neurologic:   Normal speech and language.  CN 2-10 normal. Motor grossly intact. No gross focal neurologic deficits are appreciated.  Skin:    Skin is warm, dry and intact. No rash noted.  No petechiae, purpura, or bullae. Psychiatric:   Mood and affect are normal. ____________________________________________    LABS (pertinent positives/negatives) (all labs ordered are listed, but only abnormal results are displayed) Labs Reviewed  RAPID INFLUENZA A&B ANTIGENS ([facility] ONLY) - Abnormal; Notable for the following:    Influenza B ([facility]) POSITIVE (*)    All other components within normal limits  COMPREHENSIVE METABOLIC PANEL - Abnormal; Notable for the following:     Potassium 3.4 (*)    Chloride 98 (*)    BUN 49 (*)    Creatinine, Ser 2.66 (*)    Calcium 8.5 (*)    Albumin 3.0 (*)    ALT 9 (*)    GFR calc non Af Amer 22 (*)    GFR calc Af Amer 26 (*)    All other components within normal limits  TROPONIN I - Abnormal; Notable for the following:    Troponin I 0.04 (*)    All other components within normal limits  CBC WITH DIFFERENTIAL/PLATELET - Abnormal; Notable for the following:    RBC 3.50 (*)    Hemoglobin 9.3 (*)    HCT 29.3 (*)    MCHC 31.7 (*)    RDW 18.2 (*)    Lymphs Abs 0.6 (*)    All other components within normal limits  BLOOD GAS, ARTERIAL - Abnormal; Notable for the following:    pH, Arterial 7.49 (*)    pO2, Arterial 114 (*)    Bicarbonate 31.2 (*)    Acid-Base Excess 7.2 (*)    Allens test (pass/fail) POSITIVE (*)    All other components within normal limits  CULTURE, BLOOD (ROUTINE X 2)  CULTURE, BLOOD (ROUTINE X 2)  C DIFFICILE QUICK SCREEN W PCR REFLEX  LACTIC ACID, PLASMA  LIPASE, BLOOD   ____________________________________________   EKG    ____________________________________________    RADIOLOGY  Chest x-ray unremarkable except for very slight right pleural effusion and the base.  ____________________________________________   PROCEDURES   ____________________________________________   INITIAL IMPRESSION / ASSESSMENT AND PLAN / ED COURSE  Pertinent labs & imaging results that were available during my care of the patient were reviewed by me and considered in my medical decision making (see chart for details).  Patient presents with generalized weakness vomiting and diarrhea. Exam is suggestive of possible pulmonary infiltrate in the right base. We'll check labs given the vagueness of the symptoms. I suspicion for a viral illness. However, with these episodes of vomiting and diarrhea during dialysis, it raises suspicion for a gut emptying phenomenon in the setting of transiently depleted  intravascular volume that may result in mesenteric ischemia. If the workup is otherwise negative we'll proceed with CT angiogram.  ----------------------------------------- 2:34 PM on 11/23/2015 -----------------------------------------   workup reveals influenza be positive. Troponin is 0.04 which is consistent with his long-standing baseline. Labs are otherwise unremarkable. Chest x-ray demonstrates that the rhonchi on exam or due to a very slight effusion and not an infectious process. We'll treat the patient symptomatically with antiemetics and loperamide. Follow-up with primary care. Gave him a small fluid bolus to assist with rehydration.     ____________________________________________   FINAL CLINICAL IMPRESSION(S) / ED DIAGNOSES  Final diagnoses:  Influenza B  Nausea vomiting and  diarrhea      Carrie Mew, MD 11/23/15 [phone]

## 2015-11-23 NOTE — Discharge Instructions (Signed)
Diarrhea °Diarrhea is watery poop (stool). It can make you feel weak, tired, thirsty, or give you a dry mouth (signs of dehydration). Watery poop is a sign of another problem, most often an infection. It often lasts 2-3 days. It can last longer if it is a sign of something serious. Take care of yourself as told by your doctor. °HOME CARE  °· Drink 1 cup (8 ounces) of fluid each time you have watery poop. °· Do not drink the following fluids: °· Those that contain simple sugars (fructose, glucose, galactose, lactose, sucrose, maltose). °· Sports drinks. °· Fruit juices. °· Whole milk products. °· Sodas. °· Drinks with caffeine (coffee, tea, soda) or alcohol. °· Oral rehydration solution may be used if the doctor says it is okay. You may make your own solution. Follow this recipe: °·  - teaspoon table salt. °· ¾ teaspoon baking soda. °·  teaspoon salt substitute containing potassium chloride. °· 1 tablespoons sugar. °· 1 liter (34 ounces) of water. °· Avoid the following foods: °· High fiber foods, such as raw fruits and vegetables. °· Nuts, seeds, and whole grain breads and cereals. °·  Those that are sweetened with sugar alcohols (xylitol, sorbitol, mannitol). °· Try eating the following foods: °· Starchy foods, such as rice, toast, pasta, low-sugar cereal, oatmeal, baked potatoes, crackers, and bagels. °· Bananas. °· Applesauce. °· Eat probiotic-rich foods, such as yogurt and milk products that are fermented. °· Wash your hands well after each time you have watery poop. °· Only take medicine as told by your doctor. °· Take a warm bath to help lessen burning or pain from having watery poop. °GET HELP RIGHT AWAY IF:  °· You cannot drink fluids without throwing up (vomiting). °· You keep throwing up. °· You have blood in your poop, or your poop looks black and tarry. °· You do not pee (urinate) in 6-8 hours, or there is only a small amount of very dark pee. °· You have belly (abdominal) pain that gets worse or stays  in the same spot (localizes). °· You are weak, dizzy, confused, or light-headed. °· You have a very bad headache. °· Your watery poop gets worse or does not get better. °· You have a fever or lasting symptoms for more than 2-3 days. °· You have a fever and your symptoms suddenly get worse. °MAKE SURE YOU:  °· Understand these instructions. °· Will watch your condition. °· Will get help right away if you are not doing well or get worse. °  °This information is not intended to replace advice given to you by your health care provider. Make sure you discuss any questions you have with your health care provider. °  °Document Released: 02/26/2008 Document Revised: 09/30/2014 Document Reviewed: 05/17/2012 °Elsevier Interactive Patient Education ©2016 Elsevier Inc. ° °Nausea and Vomiting °Nausea is a sick feeling that often comes before throwing up (vomiting). Vomiting is a reflex where stomach contents come out of your mouth. Vomiting can cause severe loss of body fluids (dehydration). Children and elderly adults can become dehydrated quickly, especially if they also have diarrhea. Nausea and vomiting are symptoms of a condition or disease. It is important to find the cause of your symptoms. °CAUSES  °· Direct irritation of the stomach lining. This irritation can result from increased acid production (gastroesophageal reflux disease), infection, food poisoning, taking certain medicines (such as nonsteroidal anti-inflammatory drugs), alcohol use, or tobacco use. °· Signals from the brain. These signals could be caused by a headache, heat   exposure, an inner ear disturbance, increased pressure in the brain from injury, infection, a tumor, or a concussion, pain, emotional stimulus, or metabolic problems.  An obstruction in the gastrointestinal tract (bowel obstruction).  Illnesses such as diabetes, hepatitis, gallbladder problems, appendicitis, kidney problems, cancer, sepsis, atypical symptoms of a heart attack, or eating  disorders.  Medical treatments such as chemotherapy and radiation.  Receiving medicine that makes you sleep (general anesthetic) during surgery. DIAGNOSIS Your caregiver may ask for tests to be done if the problems do not improve after a few days. Tests may also be done if symptoms are severe or if the reason for the nausea and vomiting is not clear. Tests may include:  Urine tests.  Blood tests.  Stool tests.  Cultures (to look for evidence of infection).  X-rays or other imaging studies. Test results can help your caregiver make decisions about treatment or the need for additional tests. TREATMENT You need to stay well hydrated. Drink frequently but in small amounts.You may wish to drink water, sports drinks, clear broth, or eat frozen ice pops or gelatin dessert to help stay hydrated.When you eat, eating slowly may help prevent nausea.There are also some antinausea medicines that may help prevent nausea. HOME CARE INSTRUCTIONS   Take all medicine as directed by your caregiver.  If you do not have an appetite, do not force yourself to eat. However, you must continue to drink fluids.  If you have an appetite, eat a normal diet unless your caregiver tells you differently.  Eat a variety of complex carbohydrates (rice, wheat, potatoes, bread), lean meats, yogurt, fruits, and vegetables.  Avoid high-fat foods because they are more difficult to digest.  Drink enough water and fluids to keep your urine clear or pale yellow.  If you are dehydrated, ask your caregiver for specific rehydration instructions. Signs of dehydration may include:  Severe thirst.  Dry lips and mouth.  Dizziness.  Dark urine.  Decreasing urine frequency and amount.  Confusion.  Rapid breathing or pulse. SEEK IMMEDIATE MEDICAL CARE IF:   You have blood or brown flecks (like coffee grounds) in your vomit.  You have black or bloody stools.  You have a severe headache or stiff neck.  You are  confused.  You have severe abdominal pain.  You have chest pain or trouble breathing.  You do not urinate at least once every 8 hours.  You develop cold or clammy skin.  You continue to vomit for longer than 24 to 48 hours.  You have a fever. MAKE SURE YOU:   Understand these instructions.  Will watch your condition.  Will get help right away if you are not doing well or get worse.   This information is not intended to replace advice given to you by your health care provider. Make sure you discuss any questions you have with your health care provider.   Document Released: 09/09/2005 Document Revised: 12/02/2011 Document Reviewed: 02/06/2011 Elsevier Interactive Patient Education 2016 Elsevier Inc.  Influenza, Adult Influenza ("the flu") is a viral infection of the respiratory tract. It occurs more often in winter months because people spend more time in close contact with one another. Influenza can make you feel very sick. Influenza easily spreads from person to person (contagious). CAUSES  Influenza is caused by a virus that infects the respiratory tract. You can catch the virus by breathing in droplets from an infected person's cough or sneeze. You can also catch the virus by touching something that was recently contaminated  with the virus and then touching your mouth, nose, or eyes. RISKS AND COMPLICATIONS You may be at risk for a more severe case of influenza if you smoke cigarettes, have diabetes, have chronic heart disease (such as heart failure) or lung disease (such as asthma), or if you have a weakened immune system. Elderly people and pregnant women are also at risk for more serious infections. The most common problem of influenza is a lung infection (pneumonia). Sometimes, this problem can require emergency medical care and may be life threatening. SIGNS AND SYMPTOMS  Symptoms typically last 4 to 10 days and may include:  Fever.  Chills.  Headache, body aches, and  muscle aches.  Sore throat.  Chest discomfort and cough.  Poor appetite.  Weakness or feeling tired.  Dizziness.  Nausea or vomiting. DIAGNOSIS  Diagnosis of influenza is often made based on your history and a physical exam. A nose or throat swab test can be done to confirm the diagnosis. TREATMENT  In mild cases, influenza goes away on its own. Treatment is directed at relieving symptoms. For more severe cases, your health care provider may prescribe antiviral medicines to shorten the sickness. Antibiotic medicines are not effective because the infection is caused by a virus, not by bacteria. HOME CARE INSTRUCTIONS  Take medicines only as directed by your health care provider.  Use a cool mist humidifier to make breathing easier.  Get plenty of rest until your temperature returns to normal. This usually takes 3 to 4 days.  Drink enough fluid to keep your urine clear or pale yellow.  Cover yourmouth and nosewhen coughing or sneezing,and wash your handswellto prevent thevirusfrom spreading.  Stay homefromwork orschool untilthe fever is gonefor at least 77full day. PREVENTION  An annual influenza vaccination (flu shot) is the best way to avoid getting influenza. An annual flu shot is now routinely recommended for all adults in the [place] IF:  You experiencechest pain, yourcough worsens,or you producemore mucus.  Youhave nausea,vomiting, ordiarrhea.  Your fever returns or gets worse. SEEK IMMEDIATE MEDICAL CARE IF:  You havetrouble breathing, you become short of breath,or your skin ornails becomebluish.  You have severe painor stiffnessin the neck.  You develop a sudden headache, or pain in the face or ear.  You have nausea or vomiting that you cannot control. MAKE SURE YOU:   Understand these instructions.  Will watch your condition.  Will get help right away if you are not doing well or get worse.   This information is  not intended to replace advice given to you by your health care provider. Make sure you discuss any questions you have with your health care provider.   Document Released: 09/06/2000 Document Revised: 09/30/2014 Document Reviewed: 12/09/2011 Elsevier Interactive Patient Education [employer].

## 2015-11-23 NOTE — ED Notes (Signed)
Pt arrived via EMS from dialysis. Pt received 2 hours of treatment and began having nausea, vomiting, and diarrhea. Pt is a resident of [facility].

## 2015-11-25 LAB — BLOOD CULTURE ID PANEL (REFLEXED)
ACINETOBACTER BAUMANNII: NOT DETECTED
CANDIDA KRUSEI: NOT DETECTED
CANDIDA PARAPSILOSIS: NOT DETECTED
CARBAPENEM RESISTANCE: NOT DETECTED
Candida albicans: NOT DETECTED
Candida glabrata: NOT DETECTED
Candida tropicalis: NOT DETECTED
ENTEROCOCCUS SPECIES: NOT DETECTED
Enterobacter cloacae complex: NOT DETECTED
Enterobacteriaceae species: NOT DETECTED
Escherichia coli: NOT DETECTED
Haemophilus influenzae: NOT DETECTED
KLEBSIELLA OXYTOCA: NOT DETECTED
KLEBSIELLA PNEUMONIAE: NOT DETECTED
LISTERIA MONOCYTOGENES: NOT DETECTED
Methicillin resistance: NOT DETECTED
Neisseria meningitidis: NOT DETECTED
Proteus species: NOT DETECTED
Pseudomonas aeruginosa: NOT DETECTED
SERRATIA MARCESCENS: NOT DETECTED
STAPHYLOCOCCUS SPECIES: NOT DETECTED
Staphylococcus aureus (BCID): NOT DETECTED
Streptococcus agalactiae: NOT DETECTED
Streptococcus pneumoniae: NOT DETECTED
Streptococcus pyogenes: NOT DETECTED
Streptococcus species: NOT DETECTED
Vancomycin resistance: NOT DETECTED

## 2015-11-25 NOTE — Progress Notes (Signed)
Pharmacy contacted regarding 1 out of 4 blood cultures positive for GPCs in pairs and clusters, however BCID did not result on any organism (suggesting low growth). Likely contaminant.   Per Dr. Joni Fears, also thinks its likely a contaminant, wanted me to call patient to assess for clinical improvement.   Pt resides at [facility]. Per RN Levada Dy, patient was afebrile this morning and felt well enough to go to dialysis. She will continue to monitor the patient, and if needed, bring him back for repeat blood cultures.  Roe Coombs, PharmD Pharmacy Resident 11/25/2015

## 2015-11-28 LAB — CULTURE, BLOOD (ROUTINE X 2): Culture: NO GROWTH

## 2016-01-05 ENCOUNTER — Ambulatory Visit: Payer: Medicare Other | Admitting: Family

## 2016-01-08 ENCOUNTER — Telehealth: Payer: Self-pay | Admitting: Family

## 2016-01-08 ENCOUNTER — Ambulatory Visit: Payer: Medicare Other | Admitting: Family

## 2016-01-08 NOTE — Telephone Encounter (Signed)
Patient did not show for his [facility] appointment on 01/08/16. Will attempt to reschedule.

## 2016-01-23 ENCOUNTER — Emergency Department: Payer: Medicare Other

## 2016-01-23 ENCOUNTER — Inpatient Hospital Stay
Admission: EM | Admit: 2016-01-23 | Discharge: 2016-01-29 | DRG: 268 | Disposition: A | Discharge status: Skilled Nursing Facility | Payer: Medicare Other | Source: Skilled Nursing Facility | Attending: Vascular Surgery | Admitting: Vascular Surgery

## 2016-01-23 ENCOUNTER — Encounter: Payer: Self-pay | Admitting: Emergency Medicine

## 2016-01-23 ENCOUNTER — Inpatient Hospital Stay: Payer: Medicare Other

## 2016-01-23 DIAGNOSIS — J449 Chronic obstructive pulmonary disease, unspecified: Secondary | ICD-10-CM | POA: Diagnosis present

## 2016-01-23 DIAGNOSIS — Z992 Dependence on renal dialysis: Secondary | ICD-10-CM | POA: Diagnosis not present

## 2016-01-23 DIAGNOSIS — Z9981 Dependence on supplemental oxygen: Secondary | ICD-10-CM

## 2016-01-23 DIAGNOSIS — R0602 Shortness of breath: Secondary | ICD-10-CM | POA: Diagnosis not present

## 2016-01-23 DIAGNOSIS — I429 Cardiomyopathy, unspecified: Secondary | ICD-10-CM | POA: Diagnosis present

## 2016-01-23 DIAGNOSIS — I471 Supraventricular tachycardia: Secondary | ICD-10-CM | POA: Diagnosis not present

## 2016-01-23 DIAGNOSIS — R079 Chest pain, unspecified: Secondary | ICD-10-CM | POA: Diagnosis not present

## 2016-01-23 DIAGNOSIS — D62 Acute posthemorrhagic anemia: Secondary | ICD-10-CM | POA: Diagnosis not present

## 2016-01-23 DIAGNOSIS — Z7951 Long term (current) use of inhaled steroids: Secondary | ICD-10-CM | POA: Diagnosis not present

## 2016-01-23 DIAGNOSIS — I959 Hypotension, unspecified: Secondary | ICD-10-CM | POA: Diagnosis not present

## 2016-01-23 DIAGNOSIS — Z87891 Personal history of nicotine dependence: Secondary | ICD-10-CM | POA: Diagnosis not present

## 2016-01-23 DIAGNOSIS — Z79818 Long term (current) use of other agents affecting estrogen receptors and estrogen levels: Secondary | ICD-10-CM | POA: Diagnosis not present

## 2016-01-23 DIAGNOSIS — Z8546 Personal history of malignant neoplasm of prostate: Secondary | ICD-10-CM | POA: Diagnosis not present

## 2016-01-23 DIAGNOSIS — N1339 Other hydronephrosis: Secondary | ICD-10-CM | POA: Diagnosis present

## 2016-01-23 DIAGNOSIS — Z8673 Personal history of transient ischemic attack (TIA), and cerebral infarction without residual deficits: Secondary | ICD-10-CM

## 2016-01-23 DIAGNOSIS — I724 Aneurysm of artery of lower extremity: Secondary | ICD-10-CM | POA: Diagnosis present

## 2016-01-23 DIAGNOSIS — I480 Paroxysmal atrial fibrillation: Secondary | ICD-10-CM | POA: Insufficient documentation

## 2016-01-23 DIAGNOSIS — R059 Cough, unspecified: Secondary | ICD-10-CM

## 2016-01-23 DIAGNOSIS — I132 Hypertensive heart and chronic kidney disease with heart failure and with stage 5 chronic kidney disease, or end stage renal disease: Secondary | ICD-10-CM | POA: Diagnosis present

## 2016-01-23 DIAGNOSIS — R05 Cough: Secondary | ICD-10-CM

## 2016-01-23 DIAGNOSIS — K219 Gastro-esophageal reflux disease without esophagitis: Secondary | ICD-10-CM | POA: Diagnosis present

## 2016-01-23 DIAGNOSIS — I2510 Atherosclerotic heart disease of native coronary artery without angina pectoris: Secondary | ICD-10-CM | POA: Diagnosis present

## 2016-01-23 DIAGNOSIS — I723 Aneurysm of iliac artery: Secondary | ICD-10-CM | POA: Diagnosis not present

## 2016-01-23 DIAGNOSIS — I70203 Unspecified atherosclerosis of native arteries of extremities, bilateral legs: Secondary | ICD-10-CM | POA: Diagnosis present

## 2016-01-23 DIAGNOSIS — J441 Chronic obstructive pulmonary disease with (acute) exacerbation: Secondary | ICD-10-CM | POA: Diagnosis not present

## 2016-01-23 DIAGNOSIS — E785 Hyperlipidemia, unspecified: Secondary | ICD-10-CM | POA: Diagnosis present

## 2016-01-23 DIAGNOSIS — N1330 Unspecified hydronephrosis: Secondary | ICD-10-CM | POA: Diagnosis not present

## 2016-01-23 DIAGNOSIS — Z880 Allergy status to penicillin: Secondary | ICD-10-CM

## 2016-01-23 DIAGNOSIS — I5042 Chronic combined systolic (congestive) and diastolic (congestive) heart failure: Secondary | ICD-10-CM | POA: Diagnosis not present

## 2016-01-23 DIAGNOSIS — F0390 Unspecified dementia without behavioral disturbance: Secondary | ICD-10-CM | POA: Diagnosis present

## 2016-01-23 DIAGNOSIS — R369 Urethral discharge, unspecified: Secondary | ICD-10-CM | POA: Diagnosis present

## 2016-01-23 DIAGNOSIS — I351 Nonrheumatic aortic (valve) insufficiency: Secondary | ICD-10-CM | POA: Diagnosis not present

## 2016-01-23 DIAGNOSIS — I4891 Unspecified atrial fibrillation: Secondary | ICD-10-CM | POA: Diagnosis not present

## 2016-01-23 DIAGNOSIS — Z8551 Personal history of malignant neoplasm of bladder: Secondary | ICD-10-CM | POA: Diagnosis not present

## 2016-01-23 DIAGNOSIS — N186 End stage renal disease: Secondary | ICD-10-CM | POA: Diagnosis present

## 2016-01-23 DIAGNOSIS — I714 Abdominal aortic aneurysm, without rupture, unspecified: Secondary | ICD-10-CM | POA: Diagnosis present

## 2016-01-23 DIAGNOSIS — C7951 Secondary malignant neoplasm of bone: Secondary | ICD-10-CM | POA: Diagnosis present

## 2016-01-23 DIAGNOSIS — J9610 Chronic respiratory failure, unspecified whether with hypoxia or hypercapnia: Secondary | ICD-10-CM | POA: Diagnosis present

## 2016-01-23 DIAGNOSIS — C61 Malignant neoplasm of prostate: Secondary | ICD-10-CM | POA: Diagnosis present

## 2016-01-23 DIAGNOSIS — R1030 Lower abdominal pain, unspecified: Secondary | ICD-10-CM | POA: Diagnosis present

## 2016-01-23 DIAGNOSIS — D631 Anemia in chronic kidney disease: Secondary | ICD-10-CM | POA: Diagnosis present

## 2016-01-23 DIAGNOSIS — J432 Centrilobular emphysema: Secondary | ICD-10-CM | POA: Diagnosis not present

## 2016-01-23 LAB — BASIC METABOLIC PANEL
Anion gap: 8 (ref 5–15)
BUN: 63 mg/dL — AB (ref 6–20)
CALCIUM: 8.4 mg/dL — AB (ref 8.9–10.3)
CHLORIDE: 102 mmol/L (ref 101–111)
CO2: 30 mmol/L (ref 22–32)
CREATININE: 3.76 mg/dL — AB (ref 0.61–1.24)
GFR calc Af Amer: 17 mL/min — ABNORMAL LOW (ref >60)
GFR calc non Af Amer: 15 mL/min — ABNORMAL LOW (ref >60)
GLUCOSE: 85 mg/dL (ref 65–99)
Potassium: 4.5 mmol/L (ref 3.5–5.1)
Sodium: 140 mmol/L (ref 135–145)

## 2016-01-23 MED ORDER — ATORVASTATIN CALCIUM 20 MG PO TABS
40.0000 mg | ORAL_TABLET | Freq: Every day | ORAL | Status: DC
  Administered 2016-01-23 – 2016-01-28 (×6): 40 mg via ORAL
  Filled 2016-01-23 (×6): qty 2

## 2016-01-23 MED ORDER — SODIUM CHLORIDE 0.9 % IV SOLN
500.0000 mL | Freq: Once | INTRAVENOUS | Status: AC | PRN
  Administered 2016-01-24 (×2): via INTRAVENOUS

## 2016-01-23 MED ORDER — ACETAMINOPHEN 325 MG PO TABS
ORAL_TABLET | ORAL | Status: AC
  Administered 2016-01-23: 650 mg via ORAL
  Filled 2016-01-23: qty 2

## 2016-01-23 MED ORDER — AMLODIPINE BESYLATE 5 MG PO TABS
2.5000 mg | ORAL_TABLET | Freq: Every day | ORAL | Status: DC
  Administered 2016-01-23 – 2016-01-26 (×2): 2.5 mg via ORAL
  Filled 2016-01-23 (×3): qty 1

## 2016-01-23 MED ORDER — MAGNESIUM OXIDE 400 (241.3 MG) MG PO TABS
400.0000 mg | ORAL_TABLET | Freq: Every evening | ORAL | Status: DC
  Administered 2016-01-23 – 2016-01-28 (×5): 400 mg via ORAL
  Filled 2016-01-23 (×6): qty 1

## 2016-01-23 MED ORDER — MOMETASONE FURO-FORMOTEROL FUM 200-5 MCG/ACT IN AERO
2.0000 | INHALATION_SPRAY | Freq: Two times a day (BID) | RESPIRATORY_TRACT | Status: DC
  Administered 2016-01-23 – 2016-01-29 (×11): 2 via RESPIRATORY_TRACT
  Filled 2016-01-23: qty 8.8

## 2016-01-23 MED ORDER — GUAIFENESIN ER 600 MG PO TB12
600.0000 mg | ORAL_TABLET | Freq: Two times a day (BID) | ORAL | Status: DC
  Administered 2016-01-23 – 2016-01-29 (×11): 600 mg via ORAL
  Filled 2016-01-23 (×11): qty 1

## 2016-01-23 MED ORDER — AMIODARONE HCL 200 MG PO TABS
200.0000 mg | ORAL_TABLET | Freq: Every day | ORAL | Status: DC
  Administered 2016-01-25 – 2016-01-26 (×2): 200 mg via ORAL
  Filled 2016-01-23 (×2): qty 1

## 2016-01-23 MED ORDER — AMLODIPINE BESYLATE 5 MG PO TABS: 2.5000 mg | ORAL_TABLET | Freq: Every day | ORAL | Status: DC

## 2016-01-23 MED ORDER — AMIODARONE HCL 200 MG PO TABS: 200.0000 mg | ORAL_TABLET | Freq: Every day | ORAL | Status: DC

## 2016-01-23 MED ORDER — FERROUS SULFATE 325 (65 FE) MG PO TABS
325.0000 mg | ORAL_TABLET | Freq: Every day | ORAL | Status: DC
  Administered 2016-01-23 – 2016-01-29 (×6): 325 mg via ORAL
  Filled 2016-01-23 (×6): qty 1

## 2016-01-23 MED ORDER — POLYETHYLENE GLYCOL 3350 17 G PO PACK: 17.0000 g | PACK | Freq: Every day | ORAL | Status: DC | PRN

## 2016-01-23 MED ORDER — ONDANSETRON 8 MG PO TBDP
8.0000 mg | ORAL_TABLET | Freq: Three times a day (TID) | ORAL | Status: DC | PRN
  Administered 2016-01-25: 8 mg via ORAL
  Filled 2016-01-23 (×2): qty 1

## 2016-01-23 MED ORDER — ACETAMINOPHEN 650 MG RE SUPP: 325.0000 mg | RECTAL | Status: DC | PRN

## 2016-01-23 MED ORDER — OXYCODONE HCL 5 MG PO TABS
5.0000 mg | ORAL_TABLET | ORAL | Status: DC | PRN
  Administered 2016-01-24 – 2016-01-28 (×3): 10 mg via ORAL
  Filled 2016-01-23 (×2): qty 2
  Filled 2016-01-23 (×2): qty 1

## 2016-01-23 MED ORDER — MAGNESIUM SULFATE 2 GM/50ML IV SOLN
2.0000 g | Freq: Every day | INTRAVENOUS | Status: DC | PRN
  Filled 2016-01-23: qty 50

## 2016-01-23 MED ORDER — ACETAMINOPHEN 325 MG PO TABS
325.0000 mg | ORAL_TABLET | ORAL | Status: DC | PRN
Start: 2016-01-23 — End: 2016-01-24

## 2016-01-23 MED ORDER — PHENOL 1.4 % MT LIQD: 1.0000 | OROMUCOSAL | Status: DC | PRN

## 2016-01-23 MED ORDER — DOCUSATE SODIUM 100 MG PO CAPS
100.0000 mg | ORAL_CAPSULE | Freq: Every day | ORAL | Status: DC
  Administered 2016-01-27 – 2016-01-29 (×3): 100 mg via ORAL
  Filled 2016-01-23 (×5): qty 1

## 2016-01-23 MED ORDER — LOPERAMIDE HCL 2 MG PO CAPS
4.0000 mg | ORAL_CAPSULE | Freq: Four times a day (QID) | ORAL | Status: DC | PRN
Start: 2016-01-23 — End: 2016-01-29

## 2016-01-23 MED ORDER — ZOLPIDEM TARTRATE 5 MG PO TABS
5.0000 mg | ORAL_TABLET | Freq: Every evening | ORAL | Status: DC | PRN
  Administered 2016-01-27 – 2016-01-29 (×3): 5 mg via ORAL
  Filled 2016-01-23 (×3): qty 1

## 2016-01-23 MED ORDER — SEVELAMER CARBONATE 800 MG PO TABS: 800.0000 mg | ORAL_TABLET | Freq: Three times a day (TID) | ORAL | Status: DC

## 2016-01-23 MED ORDER — ALBUTEROL SULFATE (2.5 MG/3ML) 0.083% IN NEBU
2.5000 mg | INHALATION_SOLUTION | RESPIRATORY_TRACT | Status: DC | PRN
  Administered 2016-01-24 – 2016-01-28 (×3): 2.5 mg via RESPIRATORY_TRACT
  Filled 2016-01-23 (×3): qty 3

## 2016-01-23 MED ORDER — SEVELAMER CARBONATE 800 MG PO TABS
800.0000 mg | ORAL_TABLET | Freq: Three times a day (TID) | ORAL | Status: DC
  Administered 2016-01-25 – 2016-01-29 (×12): 800 mg via ORAL
  Filled 2016-01-23 (×13): qty 1

## 2016-01-23 MED ORDER — TIOTROPIUM BROMIDE MONOHYDRATE 18 MCG IN CAPS: 18.0000 ug | ORAL_CAPSULE | Freq: Every day | RESPIRATORY_TRACT | Status: DC

## 2016-01-23 MED ORDER — TIOTROPIUM BROMIDE MONOHYDRATE 18 MCG IN CAPS
18.0000 ug | ORAL_CAPSULE | Freq: Every day | RESPIRATORY_TRACT | Status: DC
  Administered 2016-01-23 – 2016-01-29 (×6): 18 ug via RESPIRATORY_TRACT
  Filled 2016-01-23 (×2): qty 5

## 2016-01-23 MED ORDER — HEPARIN SODIUM (PORCINE) 5000 UNIT/ML IJ SOLN
5000.0000 [IU] | Freq: Three times a day (TID) | INTRAMUSCULAR | Status: DC
  Administered 2016-01-23 – 2016-01-29 (×16): 5000 [IU] via SUBCUTANEOUS
  Filled 2016-01-23 (×16): qty 1

## 2016-01-23 MED ORDER — VANCOMYCIN HCL IN DEXTROSE 1-5 GM/200ML-% IV SOLN
1000.0000 mg | INTRAVENOUS | Status: DC
  Filled 2016-01-23: qty 200

## 2016-01-23 MED ORDER — ALBUTEROL SULFATE HFA 108 (90 BASE) MCG/ACT IN AERS: 2.0000 | INHALATION_SPRAY | RESPIRATORY_TRACT | Status: DC | PRN

## 2016-01-23 MED ORDER — MORPHINE SULFATE (PF) 2 MG/ML IV SOLN: 2.0000 mg | INTRAVENOUS | Status: DC | PRN

## 2016-01-23 MED ORDER — ACETAMINOPHEN 325 MG PO TABS
650.0000 mg | ORAL_TABLET | Freq: Four times a day (QID) | ORAL | Status: DC | PRN
  Administered 2016-01-23: 650 mg via ORAL

## 2016-01-23 MED ORDER — IOPAMIDOL (ISOVUE-370) INJECTION 76%
100.0000 mL | Freq: Once | INTRAVENOUS | Status: AC | PRN
  Administered 2016-01-23: 100 mL via INTRAVENOUS

## 2016-01-23 NOTE — ED Notes (Signed)
Pt back from [facility]

## 2016-01-23 NOTE — ED Notes (Signed)
Pt here from [facility] with mass to right groin; reports it has been there for a while but has started to cause him pain. EMS reports facility gave pt a suppository and pt has gotten some relief. Pt is dialysis pt; dialysis is scheduled today.

## 2016-01-23 NOTE — ED Provider Notes (Signed)
[facility] Emergency Department Provider Note  ____________________________________________  Time seen: Approximately 9:21 AM  I have reviewed the triage vital signs and the nursing notes.   HISTORY  Chief Complaint Groin Pain    HPI [name] is a 75 y.o. male with a history of right inguinal hernia presenting with right groin mass that is more painful than before. The patient reports that for the past several months he has had a "bump" in his right groin which was diagnosed as a hernia but was mostly asymptomatic. Since last night, the patient notes an increase in the size of the "bulge" with an "irritating" pain. He denies any fever, chills, nausea or vomiting.Does not notice that it is worse with straining. The patient is no longer ambulatory.   Past Medical History  Diagnosis Date  . PAF (paroxysmal atrial fibrillation) ([place])     a. new onset 12/2014 in the setting of UTI, sepsis, hypotension, and anemia; b. not on long term anticoagulation given anemia; c. family aware of stroke risk, they are ok with this; d. on amiodarone   . Chronic combined systolic and diastolic CHF (congestive heart failure) ([place])     a. echo 12/2014: EF 25-30%, anterior wall wall motion abnormalities; b. planned ischemic evaluation once patient is stable medically; c. echo 01/2015: EF 45-50%, no RWMA, mild AT, LA mildly dilated, mod pericardial effusion along LV free wall, no evidence of hemodynamic compromise  . ESRD on hemodialysis ([place])     a. Tuesday, Thursday, and Saturdays  . Anemia     a. baseline hgb ~ 8  . History of small bowel obstruction     a. 01/2015  . History of septic shock     a. 01/2015  . GERD (gastroesophageal reflux disease)   . Allergic rhinitis   . COPD (chronic obstructive pulmonary disease) ([place])   . Hypercholesteremia   . Cognitive communication deficit   . Magnesium deficiency   . Dementia   . 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 Incontinence   .  Hydronephrosis   . Overactive bladder   . Detrusor sphincter dyssynergia   . Mini stroke ([place])   . Bladder cancer ([place])   . Prostate cancer ([place])   . Renal insufficiency   . Hypertension     Patient Active Problem List   Diagnosis Date Noted  . COPD (chronic obstructive pulmonary disease) with chronic bronchitis ([place]) 11/06/2015  . Chronic diastolic heart failure ([place]) 08/11/2015  . Fluid overload 07/23/2015  . Generalized weakness 05/08/2015  . Other emphysema ([place]) 05/08/2015  . Tobacco use disorder 05/08/2015  . Hypokalemia 05/08/2015  . Pressure ulcer 05/05/2015  . Symptomatic anemia 05/04/2015  . CAFL (chronic airflow limitation) ([place]) 04/10/2015  . Malnutrition of moderate degree ([place]) 03/22/2015  . Pneumonia 03/20/2015  . History of sepsis 02/27/2015  . Incontinence 02/27/2015  . Persistent atrial fibrillation ([place]) 02/27/2015  . Preoperative cardiovascular examination 02/27/2015  . ESRD on hemodialysis ([place])   . Anemia   . History of small bowel obstruction   . Atrial fibrillation with RVR ([place]) 01/21/2015  . Chronic kidney disease (CKD), stage IV (severe) ([place]) 03/01/2014  . Essential (primary) hypertension 03/01/2014  . 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 Cystitis 11/11/2012  . Hydronephrosis 08/27/2012  . Bladder cancer ([place]) 08/27/2012  . CA of prostate ([place]) 08/27/2012  . Bone metastases ([place]) 08/27/2012    Past Surgical History  Procedure Laterality Date  . Cystoscopy w/ ureteral stent placement    .  Transurethral resection of bladder tumor with gyrus (turbt-gyrus)    . Ureteral stent placement    . Prostate surgery      Removal  . Av fistula placement      left arm    Current Outpatient Rx  Name  Route  Sig  Dispense  Refill  . acetaminophen (TYLENOL) 325 MG tablet   Oral   Take 650 mg by mouth every 8 (eight) hours as needed. For general discomfort         . albuterol (PROVENTIL) (2.5 MG/3ML) 0.083% nebulizer solution   Nebulization   Take  3 mLs (2.5 mg total) by nebulization every 4 (four) hours as needed for wheezing or shortness of breath.   75 mL   12   . amiodarone (PACERONE) 200 MG tablet   Oral   Take 1 tablet (200 mg total) by mouth daily.   30 tablet   5   . amLODipine (NORVASC) 2.5 MG tablet   Oral   Take 2.5 mg by mouth daily.         Marland Kitchen atorvastatin (LIPITOR) 40 MG tablet   Oral   Take 40 mg by mouth at bedtime.         . budesonide-formoterol (SYMBICORT) 160-4.5 MCG/ACT inhaler   Inhalation   Inhale 2 puffs into the lungs 2 (two) times daily.         . ferrous sulfate 325 (65 FE) MG tablet   Oral   Take 325 mg by mouth daily.         Marland Kitchen guaiFENesin (MUCINEX) 600 MG 12 hr tablet   Oral   Take 1 tablet (600 mg total) by mouth 2 (two) times daily.   20 tablet   2   . loperamide (IMODIUM A-D) 2 MG tablet   Oral   Take 2 tablets (4 mg total) by mouth 4 (four) times daily as needed for diarrhea or loose stools.   30 tablet   0   . magnesium oxide (MAG-OX) 400 MG tablet   Oral   Take 1 tablet (400 mg total) by mouth every evening.   30 tablet   2   . ondansetron (ZOFRAN ODT) 8 MG disintegrating tablet   Oral   Take 1 tablet (8 mg total) by mouth every 8 (eight) hours as needed for nausea or vomiting.   20 tablet   0   . polyethylene glycol (MIRALAX / GLYCOLAX) packet   Oral   Take 17 g by mouth daily as needed for mild constipation, moderate constipation or severe constipation.         . sevelamer carbonate (RENVELA) 800 MG tablet   Oral   Take 800 mg by mouth 3 (three) times daily before meals.          . tiotropium (SPIRIVA) 18 MCG inhalation capsule   Inhalation   Place 1 capsule (18 mcg total) into inhaler and inhale daily.   30 capsule   12     Allergies Penicillins  Family History  Problem Relation Age of Onset  . Stroke Mother     Social History Social History  Substance Use Topics  . Smoking status: Former Smoker -- 0.25 packs/day for 53 years    Quit  date: 05/13/2015  . Smokeless tobacco: Never Used  . Alcohol Use: No    Review of Systems Constitutional: No fever/chills. Eyes: No visual changes. ENT: No sore throat. No congestion or rhinorrhea. Cardiovascular: Denies chest pain. Denies palpitations. Respiratory:  Denies shortness of breath.  No cough. Gastrointestinal: No abdominal pain.  No nausea, no vomiting.  No diarrhea.  No constipation. Genitourinary: Negative for dysuria.Positive right groin mass with pain. Musculoskeletal: Negative for back pain. Skin: Negative for rash. Neurological: Negative for headaches. No focal numbness, tingling or weakness.   10-point ROS otherwise negative.  ____________________________________________   PHYSICAL EXAM:  VITAL SIGNS: ED Triage Vitals  Enc Vitals Group     BP --      Pulse Rate 01/23/16 0909 67     Resp 01/23/16 0904 20     Temp 01/23/16 0904 98.1 F (36.7 C)     Temp Source 01/23/16 0904 Oral     SpO2 01/23/16 0909 99 %     Weight 01/23/16 0904 187 lb (84.823 kg)     Height 01/23/16 0904 6\' 2"  (1.88 m)     Head Cir --      Peak Flow --      Pain Score 01/23/16 0907 0     Pain Loc --      Pain Edu? --      Excl. in [place]? --     Constitutional: Alert and oriented. Chronically ill appearing and in no acute distress. Answers questions appropriately. Eyes: Conjunctivae are normal.  EOMI. No scleral icterus. Head: Atraumatic. Nose: No congestion/rhinnorhea. Mouth/Throat: Mucous membranes are moist.  Neck: No stridor.  Supple.   Cardiovascular: Normal rate Respiratory: Normal respiratory effort.  Pt wears O2 Gastrointestinal: Soft, nontender and nondistended.  No guarding or rebound.  No peritoneal signs. GU: Normal-appearing uncircumcised penis without lesions. Only a single testicle is palpable in the scrotal sac and it does not have masses or tenderness to palpation. A 10 x 8 cm hardened, nonfluctuant, mildly tender to palpation mass is present in the right inguinal  crease. The skin overlying the mass is intact without erythema. Inguinal canal examination is limited because patient is unable to stand Musculoskeletal: No LE edema. No ttp in the calves or palpable cords.  Negative Homan's sign. Neurologic:  A&Ox3.  Speech is clear.  Face and smile are symmetric.  EOMI.  Moves all extremities well. Skin:  Skin is warm, dry and intact. No rash noted. Psychiatric: Mood and affect are normal. Speech and behavior are normal.  Normal judgement.  ____________________________________________   LABS (all labs ordered are listed, but only abnormal results are displayed)  Labs Reviewed  BASIC METABOLIC PANEL - Abnormal; Notable for the following:    BUN 63 (*)    Creatinine, Ser 3.76 (*)    Calcium 8.4 (*)    GFR calc non Af Amer 15 (*)    GFR calc Af Amer 17 (*)    All other components within normal limits   ____________________________________________  EKG  Not indicated ____________________________________________  RADIOLOGY  [place] Extrem [facility]  01/23/2016  CLINICAL DATA:  Patient presents with a right groin mass with associated pain. Patient with a dialysis patient scheduled for dialysis today. History of right iliac aneurysm. Patient has an aortoiliac stent graft. EXAM: ULTRASOUND RIGHT LOWER EXTREMITY LIMITED TECHNIQUE: Ultrasound examination of the lower extremity soft tissues was performed in the area of clinical concern. COMPARISON:  None FINDINGS: There is a heterogeneous mass with a cystic component in the right inguinal region measuring 8.5 x 6.7 x 6.8 cm. This cystic component measures approximately 8.5 x 5.0 x 5.2 cm. On color Doppler analysis, there is prominent, to and fro blood flow throughout the cystic component. No other  abnormalities. IMPRESSION: 1. 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 This was present on the prior CT. There is anterior thrombus with significant turbulent blood flow in the cystic component.  Electronically Signed   By: Lajean Manes M.D.   On: 01/23/2016 11:06    ____________________________________________   PROCEDURES  Procedure(s) performed: None  Critical Care performed: No ____________________________________________   INITIAL IMPRESSION / ASSESSMENT AND PLAN / ED COURSE  Pertinent labs & imaging results that were available during my care of the patient were reviewed by me and considered in my medical decision making (see chart for details).  75 y.o. male with known right inguinal hernia presenting with increasing swelling and pain in the right inguinal crease. On my exam, the patient does have a mass there which may be an incarcerated hernia, we will also evaluate for other causes including grossly enlarged lymph node which would be unlikely, abscess, or malignancy. I will start by ultrasounding the patient but if we aren't able to get a full evaluation, we will proceed with CT of the pelvis.  ----------------------------------------- 11:17 AM on 01/23/2016 -----------------------------------------  The patient has an ultrasound that shows an iliac artery aneurysm that measures 8.5 x 5.2 x 5 cm. It also has an internal thrombus that is his largest 7.5 cm across. This is compared to imaging that was done and a CT scan previously and has significantly enlarged in size. I have made contact with Dr. Delana Meyer, who is currently in the operating room and will call me back. 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

## 2016-01-23 NOTE — H&P (Signed)
[facility] Admission History & Physical  MRN : [id number]  [name] is a 75 y.o. (08-Nov-1940) male who presents with chief complaint of  Chief Complaint  Patient presents with  . Groin Pain  .  History of Present Illness: I am asked to admit this complicated medical patient by the ER physician after medicine has refused.  The patient presented from [facility] with increasing pain in his right groin. Examination prompted an ultrasound which demonstrated this was an 8 cm common femoral aneurysm. 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 He presents wearing a nasal cannula as he is on continuous home O2. He is also a renal dialysis patient. He has significant cardiomyopathy as well as atrial fibrillation. He also has coronary artery disease. Apparently his medical comorbidities are felt to be stable.  Current Facility-Administered Medications  Medication Dose Route Frequency Provider Last Rate Last Dose  . acetaminophen (TYLENOL) tablet 650 mg  650 mg Oral Q6H PRN Fritzi Mandes, MD   650 mg at 01/23/16 1625  . albuterol (PROVENTIL HFA;VENTOLIN HFA) 108 (90 Base) MCG/ACT inhaler 2 puff  2 puff Inhalation Q4H PRN Katha Cabal, MD      . albuterol (PROVENTIL) (2.5 MG/3ML) 0.083% nebulizer solution 2.5 mg  2.5 mg Nebulization Q4H PRN Fritzi Mandes, MD      . amiodarone (PACERONE) tablet 200 mg  200 mg Oral Daily Fritzi Mandes, MD      . Derrill Memo ON 01/24/2016] amiodarone (PACERONE) tablet 200 mg  200 mg Oral Daily Katha Cabal, MD      . amLODipine (NORVASC) tablet 2.5 mg  2.5 mg Oral Daily Fritzi Mandes, MD      . Derrill Memo ON 01/24/2016] amLODipine (NORVASC) tablet 2.5 mg  2.5 mg Oral Daily Katha Cabal, MD      . atorvastatin (LIPITOR) tablet 40 mg  40 mg Oral QHS Fritzi Mandes, MD      . ferrous sulfate tablet 325 mg  325 mg Oral Daily  Fritzi Mandes, MD      . guaiFENesin (MUCINEX) 12 hr tablet 600 mg  600 mg Oral BID Katha Cabal, MD      . loperamide (IMODIUM A-D) tablet 4 mg  4 mg Oral QID PRN Fritzi Mandes, MD      . magnesium oxide (MAG-OX) tablet 400 mg  400 mg Oral QPM Fritzi Mandes, MD      . mometasone-formoterol (DULERA) 200-5 MCG/ACT inhaler 2 puff  2 puff Inhalation BID Fritzi Mandes, MD      . ondansetron (ZOFRAN-ODT) disintegrating tablet 8 mg  8 mg Oral Q8H PRN Katha Cabal, MD      . polyethylene glycol (MIRALAX / GLYCOLAX) packet 17 g  17 g Oral Daily PRN Fritzi Mandes, MD      . Derrill Memo ON 01/24/2016] sevelamer carbonate (RENVELA) tablet 800 mg  800 mg Oral TID AC Fritzi Mandes, MD      . Derrill Memo ON 01/24/2016] sevelamer carbonate (RENVELA) tablet 800 mg  800 mg Oral TID WC Katha Cabal, MD      . tiotropium [facility]) inhalation capsule 18 mcg  18 mcg Inhalation Daily Fritzi Mandes, MD      . tiotropium (SPIRIVA) inhalation capsule 18 mcg  18 mcg Inhalation Daily Katha Cabal, MD      . Derrill Memo ON 01/24/2016]  vancomycin (VANCOCIN) IVPB 1000 mg/200 mL premix  1,000 mg Intravenous To OR Katha Cabal, MD       Current Outpatient Prescriptions  Medication Sig Dispense Refill  . acetaminophen (TYLENOL) 325 MG tablet Take 650 mg by mouth every 8 (eight) hours as needed. For general discomfort    . albuterol (PROVENTIL) (2.5 MG/3ML) 0.083% nebulizer solution Take 3 mLs (2.5 mg total) by nebulization every 4 (four) hours as needed for wheezing or shortness of breath. 75 mL 12  . amiodarone (PACERONE) 200 MG tablet Take 1 tablet (200 mg total) by mouth daily. 30 tablet 5  . amLODipine (NORVASC) 2.5 MG tablet Take 2.5 mg by mouth daily.    Marland Kitchen atorvastatin (LIPITOR) 40 MG tablet Take 40 mg by mouth at bedtime.    . budesonide-formoterol (SYMBICORT) 160-4.5 MCG/ACT inhaler Inhale 2 puffs into the lungs 2 (two) times daily.    . ferrous sulfate 325 (65 FE) MG tablet Take 325 mg by mouth daily.    Marland Kitchen guaiFENesin (MUCINEX) 600 MG  12 hr tablet Take 1 tablet (600 mg total) by mouth 2 (two) times daily. 20 tablet 2  . loperamide (IMODIUM A-D) 2 MG tablet Take 2 tablets (4 mg total) by mouth 4 (four) times daily as needed for diarrhea or loose stools. 30 tablet 0  . magnesium oxide (MAG-OX) 400 MG tablet Take 1 tablet (400 mg total) by mouth every evening. 30 tablet 2  . ondansetron (ZOFRAN ODT) 8 MG disintegrating tablet Take 1 tablet (8 mg total) by mouth every 8 (eight) hours as needed for nausea or vomiting. 20 tablet 0  . polyethylene glycol (MIRALAX / GLYCOLAX) packet Take 17 g by mouth daily as needed for mild constipation, moderate constipation or severe constipation.    . sevelamer carbonate (RENVELA) 800 MG tablet Take 800 mg by mouth 3 (three) times daily before meals.     . tiotropium (SPIRIVA) 18 MCG inhalation capsule Place 1 capsule (18 mcg total) into inhaler and inhale daily. 30 capsule 12    Past Medical History  Diagnosis Date  . PAF (paroxysmal atrial fibrillation) ([place])     a. new onset 12/2014 in the setting of UTI, sepsis, hypotension, and anemia; b. not on long term anticoagulation given anemia; c. family aware of stroke risk, they are ok with this; d. on amiodarone   . Chronic combined systolic and diastolic CHF (congestive heart failure) ([place])     a. echo 12/2014: EF 25-30%, anterior wall wall motion abnormalities; b. planned ischemic evaluation once patient is stable medically; c. echo 01/2015: EF 45-50%, no RWMA, mild AT, LA mildly dilated, mod pericardial effusion along LV free wall, no evidence of hemodynamic compromise  . ESRD on hemodialysis ([place])     a. Tuesday, Thursday, and Saturdays  . Anemia     a. baseline hgb ~ 8  . History of small bowel obstruction     a. 01/2015  . History of septic shock     a. 01/2015  . GERD (gastroesophageal reflux disease)   . Allergic rhinitis   . COPD (chronic obstructive pulmonary disease) ([place])   . Hypercholesteremia   . Cognitive communication deficit   .  Magnesium deficiency   . Dementia   . 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 Incontinence   . Hydronephrosis   . Overactive bladder   . Detrusor sphincter dyssynergia   . Mini stroke ([place])   . Bladder cancer ([place])   . Prostate cancer ([place])   .  Renal insufficiency   . Hypertension     Past Surgical History  Procedure Laterality Date  . Cystoscopy w/ ureteral stent placement    . Transurethral resection of bladder tumor with gyrus (turbt-gyrus)    . Ureteral stent placement    . Prostate surgery      Removal  . Av fistula placement      left arm    Social History Social History  Substance Use Topics  . Smoking status: Former Smoker -- 0.25 packs/day for 98 years    Quit date: 05/13/2015  . Smokeless tobacco: Never Used  . Alcohol Use: No    Family History Family History  Problem Relation Age of Onset  . Stroke Mother   No family history of bleeding clotting disorders autoimmune disease or porphyria  Allergies  Allergen Reactions  . Penicillins Hives, Itching and Swelling    Has patient had a PCN reaction causing immediate rash, facial/tongue/throat swelling, SOB or lightheadedness with hypotension: Yes Has patient had a PCN reaction causing severe rash involving mucus membranes or skin necrosis: No Has patient had a PCN reaction that required hospitalization No Has patient had a PCN reaction occurring within the last 10 years: No If all of the above answers are "NO", then may proceed with Cephalosporin use.     REVIEW OF SYSTEMS (Negative unless checked)  Constitutional: [] Weight loss  [] Fever  [] Chills Cardiac: [] Chest pain   [] Chest pressure   [] Palpitations   [] Shortness of breath when laying flat   [x] Shortness of breath at rest   [x] Shortness of breath with exertion. Vascular:  [] Pain in legs with walking   [x] Pain in legs at rest   [] Pain in legs when laying flat   [] Claudication   [] Pain in feet when walking  [] Pain in feet at rest  [] Pain in feet when laying flat    [] History of DVT   [] Phlebitis   [x] Swelling in legs   [] Varicose veins   [] Non-healing ulcers Pulmonary:   [x] Uses home oxygen   [] Productive cough   [] Hemoptysis   [] Wheeze  [] COPD   [] Asthma Neurologic:  [] Dizziness  [] Blackouts   [] Seizures   [x] History of stroke   [] History of TIA  [x] Aphasia   [] Temporary blindness   [] Dysphagia   [] Weakness or numbness in arms   [] Weakness or numbness in legs Musculoskeletal:  [] Arthritis   [x] Joint swelling   [x] Joint pain   [] Low back pain Hematologic:  [] Easy bruising  [] Easy bleeding   [] Hypercoagulable state   [] Anemic  [] Hepatitis Gastrointestinal:  [] Blood in stool   [] Vomiting blood  [] Gastroesophageal reflux/heartburn   [] Difficulty swallowing. Genitourinary:  [x] Chronic kidney disease   [] Difficult urination  [] Frequent urination  [] Burning with urination   [] Blood in urine Skin:  [] Rashes   [] Ulcers   [] Wounds Psychological:  [] History of anxiety   []  History of major depression.  Physical Examination  Filed Vitals:   01/23/16 1400 01/23/16 1407 01/23/16 1430 01/23/16 1830  BP: 125/89 125/89 130/83   Pulse: 66 69  72  Temp:      TempSrc:      Resp: 23 29 22 25   Height:      Weight:      SpO2: 97% 100%  98%   Body mass index is 24 kg/(m^2). Gen: Markedly debilitated gentleman lying in a gurney, NAD Head: [place]/AT, No temporalis wasting. Prominent temp pulse not noted. Ear/Nose/Throat: Hearing grossly intact, nares w/o erythema or drainage, oropharynx w/o Erythema/Exudate, Dentition is extremely poor front teeth  are missing,  Eyes: PERRLA, EOMI.  Neck: Supple, no nuchal rigidity.  No bruit or JVD.  Pulmonary:  Good air movement, clear to auscultation bilaterally, no use of accessory muscles.  Cardiac: RRR, normal S1, S2, no Murmurs, rubs or gallops. Vascular: There is a large firm and tender pulsatile mass in his right groin, there are massive pulsatile masses behind both knees. On palpation of his abdomen there is a large pulsatile mass  consistent with a large abdominal aortic aneurysm this too is tender on palpation Vessel Right Left  Radial Palpable Palpable  Ulnar Palpable Palpable  Brachial Palpable Palpable  Carotid Palpable, without bruit Palpable, without bruit  Aorta Not palpable N/A  Femoral Palpable Palpable  Popliteal Palpable Palpable  PT Not Palpable Not Palpable  DP Not Palpable Not Palpable   Gastrointestinal: soft, tenderTo palpation as noted above/non-distended. No guarding/reflex.  Musculoskeletal: M/S 2/5 Bilateral lower extremities 5 out of 5 bilateral upper extremities.  Extremities without ischemic changes.  Neurologic: CN 2-12 intact. Pain and light touch intact in extremities.  Symmetrical.  Speech is Dysarthric. Motor exam as listed above. Psychiatric: Judgment intact, Mood & affect appropriate for pt's clinical situation. Dermatologic: No rashes or ulcers noted.  No cellulitis or open wounds. Lymph : No Cervical, Axillary, or Inguinal lymphadenopathy.     CBC Lab Results  Component Value Date   WBC 7.7 11/23/2015   HGB 9.3* 11/23/2015   HCT 29.3* 11/23/2015   MCV 83.6 11/23/2015   PLT 245 11/23/2015    BMET    Component Value Date/Time   NA 140 01/23/2016 0909   NA 141 01/20/2015 0430   K 4.5 01/23/2016 0909   K 3.6 01/20/2015 0430   CL 102 01/23/2016 0909   CL 99* 01/20/2015 0430   CO2 30 01/23/2016 0909   CO2 30 01/20/2015 0430   GLUCOSE 85 01/23/2016 0909   GLUCOSE 119* 01/20/2015 0430   BUN 63* 01/23/2016 0909   BUN 32* 01/20/2015 0430   CREATININE 3.76* 01/23/2016 0909   CREATININE 6.73* 01/20/2015 0430   CALCIUM 8.4* 01/23/2016 0909   CALCIUM 7.8* 01/20/2015 0430   GFRNONAA 15* 01/23/2016 0909   GFRNONAA 7* 01/20/2015 0430   GFRNONAA 14* 08/17/2014 1228   GFRAA 17* 01/23/2016 0909   GFRAA 9* 01/20/2015 0430   GFRAA 17* 08/17/2014 1228   Estimated Creatinine Clearance: 20 mL/min (by C-G formula based on Cr of 3.76).  COAG Lab Results  Component Value Date    INR 1.3 01/18/2015   INR 1.2 06/28/2014   INR 1.0 12/08/2011    Radiology Ct Angio Ao+bifem W/cm &/or Wo/cm  01/23/2016  CLINICAL DATA:  Right groin pain with known femoral artery pseudoaneurysm. History of EVAR to repair abdominal aortic aneurysm with previous demonstration of type 2 endoleak. EXAM: CT ANGIOGRAPHY OF ABDOMINAL AORTA WITH ILIOFEMORAL RUNOFF TECHNIQUE: Multidetector CT imaging of the abdomen, pelvis and lower extremities was performed using the standard protocol during bolus administration of intravenous contrast. Multiplanar CT image reconstructions and MIPs were obtained to evaluate the vascular anatomy. CONTRAST:  100 mL Isovue 370 IV COMPARISON:  Prior CTA of the abdomen and pelvis on 06/09/2015 as well as right groin ultrasound earlier today. FINDINGS: Aorta: The aortic sac surrounding the endograft shows interval enlargement with current maximal dimensions of approximately 5.3 x 6.1 cm compared to maximal diameter on prior study of 5.4 cm. Prominent anterior endoleak again noted extending to the left side of the aortic sac. This again most likely represents an  endoleak supplied by the inferior mesenteric artery. There is at least 1 opacified lumbar artery posteriorly to the left of midline at roughly the L4 level. This may represent additional inflow or potentially outflow. There is no evidence of type 1 endoleak. Distal endograft limbs appear normally patent and stable in position. Stable mild stenosis at the origin of the celiac axis. Stable fusiform dilatation of the SMA trunk. Stable fusiform dilatation of the proximal splenic artery and the common hepatic artery. Right Lower Extremity: Right iliac arteries are tortuous and normally patent. The internal iliac artery is occluded by embolization coils. There is an enlarging aneurysm of the right common femoral artery that also involves the proximal SFA. Based on appearance this is most likely an elongated pseudoaneurysm. Maximal  dimensions are currently 9.1 x 6.3 cm transversely and 11.9 cm in maximum height. This represents significant enlargement since prior CTA at which time maximal aneurysm dimensions were approximately 5.9 cm. The right SFA shows diffuse fusiform dilatation calcified plaque with maximum diameter of approximately 14 mm. 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 Just beyond the adductor hiatus and above the knee joint, the popliteal aneurysm measures approximately 4.3 x 4.3 cm. The popliteal artery is tortuous and becomes normal in caliber just below the knee joint. Tibial artery opacification is suboptimal. The posterior tibial artery appears continuously patent into the foot. The peroneal artery also demonstrates patency into the foot. The anterior tibial artery is likely occluded at the mid calf level. Left Lower Extremity: Left native iliac arteries are normally patent. The internal iliac artery is occluded by embolization coils. There is fusiform dilatation of the common femoral artery measuring up to 16 mm in diameter. The SFA also shows fusiform dilatation and measures 17 mm. Aneurysmal disease of the distal SFA and popliteal artery present. At the juncture of the SFA and popliteal artery near the adductor hiatus, aneurysmal disease measures 4.8 x 5.3 cm. Tortuous and aneurysmal popliteal artery continues to the knee joint. Below the knee the popliteal artery shows no significant aneurysmal disease. Tibial evaluation is limited with patency of the peroneal artery visible into the foot. The posterior and anterior tibial arteries likely are occluded in the mid to distal calf. There is stable chronic bilateral hydronephrosis and hydroureter with diffuse bladder wall thickening present. Stable bilateral adrenal nodularity. No evidence of bowel obstruction or inflammation. No free air, free fluid or abscess identified in the abdomen or pelvis. No lymphadenopathy. No hernias are seen. Bony structures  demonstrate moderate spondylosis of the lumbar spine. Review of the MIP images confirms the above findings. IMPRESSION: 1. 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 There would be concern of potential impending rupture based on progressive pain and significant enlargement since the prior CTA. Vascular surgical consultation is recommended. 2. Interval enlargement of the aneurysm sac surrounding the endograft after prior EVAR. The aneurysm sac now measures 6.1 cm in greatest diameter compared to 5.4 cm on the prior study. A prominent type 2 endoleak is again noted, most likely supplied by the inferior mesenteric artery with at least one opacified left lumbar artery noted which may be acting as outflow. 3. 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 Tortuous aneurysmal disease of the right popliteal artery measures 4.3 cm in diameter and maximal diameter of aneurysmal disease on the left at the SFA/popliteal junction is 5.3 cm. Tibial evaluation is  limited, likely reflecting slow flow. The right anterior tibial artery is likely occluded. The left posterior and anterior tibial arteries are likely occluded. Electronically Signed   By: Aletta Edouard M.D.   On: 01/23/2016 15:48   [place] Extrem Low Right Ltd  01/23/2016  CLINICAL DATA:  Patient presents with a right groin mass with associated pain. Patient with a dialysis patient scheduled for dialysis today. History of right iliac aneurysm. Patient has an aortoiliac stent graft. EXAM: ULTRASOUND RIGHT LOWER EXTREMITY LIMITED TECHNIQUE: Ultrasound examination of the lower extremity soft tissues was performed in the area of clinical concern. COMPARISON:  None FINDINGS: There is a heterogeneous mass with a cystic component in the right inguinal region measuring 8.5 x 6.7 x 6.8 cm. This cystic component measures  approximately 8.5 x 5.0 x 5.2 cm. On color Doppler analysis, there is prominent, to and fro blood flow throughout the cystic component. No other abnormalities. IMPRESSION: 1. 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 This was present on the prior CT. There is anterior thrombus with significant turbulent blood flow in the cystic component. Electronically Signed   By: Lajean Manes M.D.   On: 01/23/2016 11:06   Assessment/Plan 1.  Symptomatic abdominal aortic aneurysm: Review of the CT scan shows a large type I endoleak with reexpansion of his aneurysm which now exceeds 6 cm in size. Review of the scan from September 2016 demonstrates the aneurysm was only 5.5 but did show an endoleak at that time. Thus over the past approximately 6 months he has grown substantially he is tender to palpation and it is it a very high risk for rupture. Given the fact that he is on dialysis at this time there is ample room below the superior mesenteric artery to allow for extension cuffs and hopefully seal of the type I endoleak reestablishing competency of the aortic stent. The risks and benefits of been reviewed with the patient and at his request his cousin all questions of been answered alternative therapies of been reviewed he has been informed of his high likelihood of rupture and subsequent death and he stated that he wants everything to be done to prevent this and wishes to proceed with re-intervention.  2.  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 Given his tenderness he is a extremely high risk for rupture and death. I believe this can be treated with a stent graft although this is admittedly less than ideal given all of his overall comorbidities and his tremendously high surgical risk I would favor stent grafting over I passed grafting and open surgery.  The risks and benefits as well as the above statement were discussed in detail with both the patient and at his request his cousin all questions of been answered again he wishes to proceed with anything that will prolong his life.  3.  Cardiac arrhythmia: His Pacerone will be continued he will be monitored in the intensive care unit postoperatively  4.  End-stage renal disease on hemodialysis: Dialysis will continue and nephrology has been consult to  5.  COPD with home oxygen dependence: For now O2 by nasal cannula will continue postoperatively we will wean to extubate as expeditiously as possible his inhalers will be continued   Ndea Kilroy, Dolores Lory, MD  01/23/2016 7:27 PM

## 2016-01-23 NOTE — ED Notes (Signed)
Crystal, RN  from [facility] called to get update on pt. RN informed that pt will be admitted

## 2016-01-23 NOTE — Consult Note (Signed)
[facility] [place] NAME: [name]    MR#:  [id number]  DATE OF BIRTH:  [birth year]  DATE OF ADMISSION:  01/23/2016  PRIMARY CARE PHYSICIAN: Dion Body, MD   REQUESTING/REFERRING PHYSICIAN: Dr Delana Meyer  CHIEF COMPLAINT:   Chief Complaint  Patient presents with  . Groin Pain    HISTORY OF PRESENT ILLNESS:  [name]  is a 75 y.o. 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 fib, hypertension, COPD comes to the emergency room from [facility] after he noticed the right groin mass progressively increasing in size and more so felt tender last couple days. Patient was evaluated with ultrasound in the right groin which appears to show pseudoaneurysm. CT Angio results are pending. Patient has significant medical problems which are stable and her medicine was consulted to manage his medical problems.  PAST MEDICAL HISTORY:   Past Medical History  Diagnosis Date  . PAF (paroxysmal atrial fibrillation) ([place])     a. new onset 12/2014 in the setting of UTI, sepsis, hypotension, and anemia; b. not on long term anticoagulation given anemia; c. family aware of stroke risk, they are ok with this; d. on amiodarone   . Chronic combined systolic and diastolic CHF (congestive heart failure) ([place])     a. echo 12/2014: EF 25-30%, anterior wall wall motion abnormalities; b. planned ischemic evaluation once patient is stable medically; c. echo 01/2015: EF 45-50%, no RWMA, mild AT, LA mildly dilated, mod pericardial effusion along LV free wall, no evidence of hemodynamic compromise  . ESRD on hemodialysis ([place])     a. Tuesday, Thursday, and Saturdays  . Anemia     a. baseline hgb ~ 8  . History of small bowel obstruction     a. 01/2015  . History of septic shock     a. 01/2015  . GERD (gastroesophageal reflux disease)   . Allergic rhinitis   . COPD (chronic obstructive pulmonary disease) ([place])   .  Hypercholesteremia   . Cognitive communication deficit   . Magnesium deficiency   . Dementia   . 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 Incontinence   . Hydronephrosis   . Overactive bladder   . Detrusor sphincter dyssynergia   . Mini stroke ([place])   . Bladder cancer ([place])   . Prostate cancer ([place])   . Renal insufficiency   . Hypertension     PAST SURGICAL HISTOIRY:   Past Surgical History  Procedure Laterality Date  . Cystoscopy w/ ureteral stent placement    . Transurethral resection of bladder tumor with gyrus (turbt-gyrus)    . Ureteral stent placement    . Prostate surgery      Removal  . Av fistula placement      left arm    SOCIAL HISTORY:   Social History  Substance Use Topics  . Smoking status: Former Smoker -- 0.25 packs/day for 88 years    Quit date: 05/13/2015  . Smokeless tobacco: Never Used  . Alcohol Use: No    FAMILY HISTORY:   Family History  Problem Relation Age of Onset  . Stroke Mother     DRUG ALLERGIES:   Allergies  Allergen Reactions  . Penicillins Hives, Itching and Swelling    Has patient had a PCN reaction causing immediate rash, facial/tongue/throat swelling, SOB or lightheadedness with hypotension: Yes Has patient had a PCN reaction causing severe rash involving mucus membranes or skin necrosis: No Has patient  had a PCN reaction that required hospitalization No Has patient had a PCN reaction occurring within the last 10 years: No If all of the above answers are "NO", then may proceed with Cephalosporin use.    REVIEW OF SYSTEMS:   Review of Systems  Constitutional: Negative for fever, chills and weight loss.  HENT: Negative for ear discharge, ear pain and nosebleeds.   Eyes: Negative for blurred vision, pain and discharge.  Respiratory: Negative for sputum production, shortness of breath, wheezing and stridor.   Cardiovascular: Negative for chest pain, palpitations, orthopnea and PND.  Gastrointestinal: Negative for nausea, vomiting,  abdominal pain and diarrhea.  Genitourinary: Negative for urgency and frequency.       Right groin pain with swelling and tenderness  Musculoskeletal: Negative for back pain and joint pain.  Neurological: Positive for weakness. Negative for sensory change, speech change and focal weakness.  Psychiatric/Behavioral: Negative for depression and hallucinations. The patient is not nervous/anxious.    MEDICATIONS AT HOME:   Prior to Admission medications   Medication Sig Start Date End Date Taking? Authorizing Provider  acetaminophen (TYLENOL) 325 MG tablet Take 650 mg by mouth every 8 (eight) hours as needed. For general discomfort   Yes Historical Provider, MD  albuterol (PROVENTIL) (2.5 MG/3ML) 0.083% nebulizer solution Take 3 mLs (2.5 mg total) by nebulization every 4 (four) hours as needed for wheezing or shortness of breath. 06/10/15  Yes Fritzi Mandes, MD  amiodarone (PACERONE) 200 MG tablet Take 1 tablet (200 mg total) by mouth daily. 09/13/15  Yes Gladstone Lighter, MD  amLODipine (NORVASC) 2.5 MG tablet Take 2.5 mg by mouth daily.   Yes Historical Provider, MD  atorvastatin (LIPITOR) 40 MG tablet Take 40 mg by mouth at bedtime.   Yes Historical Provider, MD  budesonide-formoterol (SYMBICORT) 160-4.5 MCG/ACT inhaler Inhale 2 puffs into the lungs 2 (two) times daily.   Yes Historical Provider, MD  ferrous sulfate 325 (65 FE) MG tablet Take 325 mg by mouth daily.   Yes Historical Provider, MD  guaiFENesin (MUCINEX) 600 MG 12 hr tablet Take 1 tablet (600 mg total) by mouth 2 (two) times daily. 09/13/15  Yes Gladstone Lighter, MD  loperamide (IMODIUM A-D) 2 MG tablet Take 2 tablets (4 mg total) by mouth 4 (four) times daily as needed for diarrhea or loose stools. 11/23/15  Yes Carrie Mew, MD  magnesium oxide (MAG-OX) 400 MG tablet Take 1 tablet (400 mg total) by mouth every evening. 09/13/15  Yes Gladstone Lighter, MD  ondansetron (ZOFRAN ODT) 8 MG disintegrating tablet Take 1 tablet (8 mg total)  by mouth every 8 (eight) hours as needed for nausea or vomiting. 11/23/15  Yes Carrie Mew, MD  polyethylene glycol [facility] Floria Raveling) packet Take 17 g by mouth daily as needed for mild constipation, moderate constipation or severe constipation.   Yes Historical Provider, MD  sevelamer carbonate (RENVELA) 800 MG tablet Take 800 mg by mouth 3 (three) times daily before meals.    Yes Historical Provider, MD  tiotropium (SPIRIVA) 18 MCG inhalation capsule Place 1 capsule (18 mcg total) into inhaler and inhale daily. 09/13/15  Yes Gladstone Lighter, MD      VITAL SIGNS:  Blood pressure 125/89, pulse 69, temperature 98.1 F (36.7 C), temperature source Oral, resp. rate 29, height 6\' 2"  (1.88 m), weight 84.823 kg (187 lb), SpO2 100 %.  PHYSICAL EXAMINATION:  GENERAL:  75 y.o.-year-old patient lying in the bed with no acute distress.  EYES: Pupils equal, round, reactive to  light and accommodation. No scleral icterus. Extraocular muscles intact.  HEENT: Head atraumatic, normocephalic. Oropharynx and nasopharynx clear.  NECK:  Supple, no jugular venous distention. No thyroid enlargement, no tenderness.  LUNGS: Normal breath sounds bilaterally, no wheezing, rales,rhonchi or crepitation. No use of accessory muscles of respiration.  CARDIOVASCULAR: S1, S2 normal. No murmurs, rubs, or gallops.  ABDOMEN: Soft, nontender, nondistended. Bowel sounds present. No organomegaly or mass.  EXTREMITIES: No pedal edema, cyanosis, or clubbing. Right groin swelling and tender to touch NEUROLOGIC: Cranial nerves II through XII are intact. Muscle strength 5/5 in all extremities. Sensation intact. Gait not checked.  PSYCHIATRIC: The patient is alert and oriented x 3.  SKIN: No obvious rash, lesion, or ulcer.   LABORATORY PANEL:   CBC No results for input(s): WBC, HGB, HCT, PLT in the last 168  hours. ------------------------------------------------------------------------------------------------------------------  Chemistries   Recent Labs Lab 01/23/16 0909  NA 140  K 4.5  CL 102  CO2 30  GLUCOSE 85  BUN 63*  CREATININE 3.76*  CALCIUM 8.4*   ------------------------------------------------------------------------------------------------------------------  Cardiac Enzymes No results for input(s): TROPONINI in the last 168 hours. ------------------------------------------------------------------------------------------------------------------  RADIOLOGY:  [place] Extrem Low Right Ltd  01/23/2016  CLINICAL DATA:  Patient presents with a right groin mass with associated pain. Patient with a dialysis patient scheduled for dialysis today. History of right iliac aneurysm. Patient has an aortoiliac stent graft. EXAM: ULTRASOUND RIGHT LOWER EXTREMITY LIMITED TECHNIQUE: Ultrasound examination of the lower extremity soft tissues was performed in the area of clinical concern. COMPARISON:  None FINDINGS: There is a heterogeneous mass with a cystic component in the right inguinal region measuring 8.5 x 6.7 x 6.8 cm. This cystic component measures approximately 8.5 x 5.0 x 5.2 cm. On color Doppler analysis, there is prominent, to and fro blood flow throughout the cystic component. No other abnormalities. IMPRESSION: 1. 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 This was present on the prior CT. There is anterior thrombus with significant turbulent blood flow in the cystic component. Electronically Signed   By: Lajean Manes M.D.   On: 01/23/2016 11:06    EKG:   Orders placed or performed during the hospital encounter of 10/19/15  . ED EKG  . ED EKG    IMPRESSION AND PLAN:   [name]  is a 75 y.o. 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 fib, hypertension, COPD comes to the emergency room from [facility] after he noticed the  right groin mass progressively increasing in size and more so felt tender last several days.  1. End-stage renal disease on hemodialysis Tuesday Thursday Saturday -Nephrology consultation place. -Patient's dialysis will be resuming house  2. Hyperlipidemia continue atorvastatin  3 hypertension continue home meds  4. COPD appears stable resume meds and continue oxygen as before  5. Right groin pseudoaneurysm per vascular surgery  6. DVT prophylaxis subcutaneous heparin   All the records are reviewed and case discussed with Consulting provider. Management plans discussed with the patient, family and they are in agreement.  CODE STATUS: Full  TOTAL TIME TAKING CARE OF THIS PATIENT: 45 minutes.    Judythe Postema M.D on 01/23/2016 at 3:19 PM  Between 7am to 6pm - Pager - [phone]  After 6pm go to [email or web address] - password [email or web address] [facility]  [place] [facility]  Office  [phone]  CC: Primary care Physician: Dion Body, MD

## 2016-01-24 ENCOUNTER — Inpatient Hospital Stay: Payer: Medicare Other | Admitting: Anesthesiology

## 2016-01-24 ENCOUNTER — Inpatient Hospital Stay: Payer: Medicare Other

## 2016-01-24 ENCOUNTER — Encounter: Payer: Self-pay | Admitting: Anesthesiology

## 2016-01-24 ENCOUNTER — Encounter
Admission: EM | Disposition: A | Discharge status: Skilled Nursing Facility | Payer: Self-pay | Source: Skilled Nursing Facility | Attending: Vascular Surgery

## 2016-01-24 HISTORY — PX: PERIPHERAL VASCULAR CATHETERIZATION: SHX172C

## 2016-01-24 LAB — GLUCOSE, CAPILLARY
GLUCOSE-CAPILLARY: 80 mg/dL (ref 65–99)
GLUCOSE-CAPILLARY: 98 mg/dL (ref 65–99)

## 2016-01-24 LAB — BASIC METABOLIC PANEL
Anion gap: 12 (ref 5–15)
BUN: 31 mg/dL — ABNORMAL HIGH (ref 6–20)
CALCIUM: 8.4 mg/dL — AB (ref 8.9–10.3)
CO2: 30 mmol/L (ref 22–32)
CREATININE: 2.05 mg/dL — AB (ref 0.61–1.24)
Chloride: 98 mmol/L — ABNORMAL LOW (ref 101–111)
GFR calc non Af Amer: 30 mL/min — ABNORMAL LOW (ref >60)
GFR, EST AFRICAN AMERICAN: 35 mL/min — AB (ref >60)
GLUCOSE: 92 mg/dL (ref 65–99)
Potassium: 4 mmol/L (ref 3.5–5.1)
Sodium: 140 mmol/L (ref 135–145)

## 2016-01-24 LAB — TROPONIN I: Troponin I: 0.03 ng/mL (ref <0.031)

## 2016-01-24 LAB — CBC
HEMATOCRIT: 28.6 % — AB (ref 40.0–52.0)
Hemoglobin: 9 g/dL — ABNORMAL LOW (ref 13.0–18.0)
MCH: 26.7 pg (ref 26.0–34.0)
MCHC: 31.4 g/dL — AB (ref 32.0–36.0)
MCV: 85.2 fL (ref 80.0–100.0)
Platelets: 210 10*3/uL (ref 150–440)
RBC: 3.36 MIL/uL — ABNORMAL LOW (ref 4.40–5.90)
RDW: 17.7 % — AB (ref 11.5–14.5)
WBC: 7.4 10*3/uL (ref 3.8–10.6)

## 2016-01-24 LAB — PREPARE RBC (CROSSMATCH)

## 2016-01-24 LAB — PHOSPHORUS: Phosphorus: 4.3 mg/dL (ref 2.5–4.6)

## 2016-01-24 LAB — MRSA PCR SCREENING: MRSA by PCR: NEGATIVE

## 2016-01-24 SURGERY — ENDOVASCULAR REPAIR/STENT GRAFT
Anesthesia: General

## 2016-01-24 MED ORDER — EPHEDRINE SULFATE 50 MG/ML IJ SOLN
INTRAMUSCULAR | Status: DC | PRN
  Administered 2016-01-24 (×2): 5 mg via INTRAVENOUS
  Administered 2016-01-24: 10 mg via INTRAVENOUS

## 2016-01-24 MED ORDER — FAMOTIDINE IN NACL 20-0.9 MG/50ML-% IV SOLN
20.0000 mg | Freq: Two times a day (BID) | INTRAVENOUS | Status: DC
  Administered 2016-01-24 – 2016-01-25 (×2): 20 mg via INTRAVENOUS
  Filled 2016-01-24 (×3): qty 50

## 2016-01-24 MED ORDER — POTASSIUM CHLORIDE CRYS ER 20 MEQ PO TBCR: 20.0000 meq | EXTENDED_RELEASE_TABLET | Freq: Every day | ORAL | Status: DC | PRN

## 2016-01-24 MED ORDER — METOPROLOL TARTRATE 5 MG/5ML IV SOLN: 2.0000 mg | INTRAVENOUS | Status: DC | PRN

## 2016-01-24 MED ORDER — MORPHINE SULFATE (PF) 2 MG/ML IV SOLN
2.0000 mg | INTRAVENOUS | Status: DC | PRN
  Administered 2016-01-25: 2 mg via INTRAVENOUS
  Administered 2016-01-26: 4 mg via INTRAVENOUS
  Administered 2016-01-26: 2 mg via INTRAVENOUS
  Filled 2016-01-24: qty 2
  Filled 2016-01-24 (×2): qty 1

## 2016-01-24 MED ORDER — DOCUSATE SODIUM 100 MG PO CAPS: 100.0000 mg | ORAL_CAPSULE | Freq: Every day | ORAL | Status: DC

## 2016-01-24 MED ORDER — GUAIFENESIN-DM 100-10 MG/5ML PO SYRP: 15.0000 mL | ORAL_SOLUTION | ORAL | Status: DC | PRN

## 2016-01-24 MED ORDER — LABETALOL HCL 5 MG/ML IV SOLN: 10.0000 mg | INTRAVENOUS | Status: DC | PRN

## 2016-01-24 MED ORDER — ONDANSETRON HCL 4 MG/2ML IJ SOLN: 4.0000 mg | Freq: Once | INTRAMUSCULAR | Status: DC | PRN

## 2016-01-24 MED ORDER — SUCCINYLCHOLINE CHLORIDE 20 MG/ML IJ SOLN
INTRAMUSCULAR | Status: DC | PRN
  Administered 2016-01-24: 120 mg via INTRAVENOUS

## 2016-01-24 MED ORDER — IOPAMIDOL (ISOVUE-300) INJECTION 61%
INTRAVENOUS | Status: DC | PRN
  Administered 2016-01-24: 130 mL via INTRAVENOUS

## 2016-01-24 MED ORDER — HEPARIN SODIUM (PORCINE) 1000 UNIT/ML IJ SOLN
INTRAMUSCULAR | Status: DC | PRN
  Administered 2016-01-24: 1000 [IU] via INTRAVENOUS
  Administered 2016-01-24: 5000 [IU] via INTRAVENOUS

## 2016-01-24 MED ORDER — SODIUM CHLORIDE 0.9 % IV SOLN
INTRAVENOUS | Status: DC
  Administered 2016-01-24: 19:00:00 via INTRAVENOUS

## 2016-01-24 MED ORDER — CLINDAMYCIN PHOSPHATE 300 MG/50ML IV SOLN
300.0000 mg | Freq: Once | INTRAVENOUS | Status: AC
  Administered 2016-01-24: 300 mg via INTRAVENOUS
  Filled 2016-01-24: qty 50

## 2016-01-24 MED ORDER — SODIUM CHLORIDE 0.9 % IV SOLN: 100.0000 mL | INTRAVENOUS | Status: DC | PRN

## 2016-01-24 MED ORDER — ONDANSETRON HCL 4 MG/2ML IJ SOLN: 4.0000 mg | Freq: Four times a day (QID) | INTRAMUSCULAR | Status: DC | PRN

## 2016-01-24 MED ORDER — MAGNESIUM SULFATE 2 GM/50ML IV SOLN
2.0000 g | Freq: Every day | INTRAVENOUS | Status: DC | PRN
  Filled 2016-01-24: qty 50

## 2016-01-24 MED ORDER — SODIUM CHLORIDE 0.9 % IV SOLN: Freq: Once | INTRAVENOUS | Status: DC

## 2016-01-24 MED ORDER — PHENYLEPHRINE HCL 10 MG/ML IJ SOLN
INTRAMUSCULAR | Status: DC | PRN
  Administered 2016-01-24 (×4): 100 ug via INTRAVENOUS
  Administered 2016-01-24: 50 ug via INTRAVENOUS
  Administered 2016-01-24 (×2): 100 ug via INTRAVENOUS
  Administered 2016-01-24: 150 ug via INTRAVENOUS

## 2016-01-24 MED ORDER — NEOSTIGMINE METHYLSULFATE 10 MG/10ML IV SOLN
INTRAVENOUS | Status: DC | PRN
  Administered 2016-01-24: 4 mg via INTRAVENOUS

## 2016-01-24 MED ORDER — ONDANSETRON HCL 4 MG/2ML IJ SOLN
INTRAMUSCULAR | Status: DC | PRN
  Administered 2016-01-24: 4 mg via INTRAVENOUS

## 2016-01-24 MED ORDER — DOPAMINE-DEXTROSE 3.2-5 MG/ML-% IV SOLN: 3.0000 ug/kg/min | INTRAVENOUS | Status: DC

## 2016-01-24 MED ORDER — PHENYLEPHRINE HCL 10 MG/ML IJ SOLN
10000.0000 ug | INTRAMUSCULAR | Status: DC | PRN
  Administered 2016-01-24: 10 ug/min via INTRAVENOUS

## 2016-01-24 MED ORDER — CLINDAMYCIN PHOSPHATE 300 MG/50ML IV SOLN
INTRAVENOUS | Status: AC
  Filled 2016-01-24: qty 50

## 2016-01-24 MED ORDER — DEXTROSE 5 % IV SOLN
1.5000 g | Freq: Two times a day (BID) | INTRAVENOUS | Status: AC
  Administered 2016-01-24 – 2016-01-25 (×2): 1.5 g via INTRAVENOUS
  Filled 2016-01-24 (×2): qty 1.5

## 2016-01-24 MED ORDER — GLYCOPYRROLATE 0.2 MG/ML IJ SOLN
INTRAMUSCULAR | Status: DC | PRN
  Administered 2016-01-24: .8 mg via INTRAVENOUS

## 2016-01-24 MED ORDER — FENTANYL CITRATE (PF) 100 MCG/2ML IJ SOLN
25.0000 ug | INTRAMUSCULAR | Status: DC | PRN
  Administered 2016-01-24 (×3): 25 ug via INTRAVENOUS

## 2016-01-24 MED ORDER — CLINDAMYCIN PHOSPHATE 900 MG/50ML IV SOLN: 900.0000 mg | Freq: Once | INTRAVENOUS | Status: DC

## 2016-01-24 MED ORDER — ACETAMINOPHEN 650 MG RE SUPP: 325.0000 mg | RECTAL | Status: DC | PRN

## 2016-01-24 MED ORDER — HYDRALAZINE HCL 20 MG/ML IJ SOLN: 5.0000 mg | INTRAMUSCULAR | Status: DC | PRN

## 2016-01-24 MED ORDER — NITROGLYCERIN IN D5W 200-5 MCG/ML-% IV SOLN: 5.0000 ug/min | INTRAVENOUS | Status: DC

## 2016-01-24 MED ORDER — ACETAMINOPHEN 325 MG PO TABS
325.0000 mg | ORAL_TABLET | ORAL | Status: DC | PRN
  Administered 2016-01-24: 650 mg via ORAL
  Filled 2016-01-24: qty 2

## 2016-01-24 MED ORDER — FENTANYL CITRATE (PF) 100 MCG/2ML IJ SOLN
INTRAMUSCULAR | Status: DC | PRN
  Administered 2016-01-24 (×2): 50 ug via INTRAVENOUS

## 2016-01-24 MED ORDER — SODIUM CHLORIDE 0.9 % IV SOLN: 500.0000 mL | Freq: Once | INTRAVENOUS | Status: DC | PRN

## 2016-01-24 MED ORDER — ETOMIDATE 2 MG/ML IV SOLN
INTRAVENOUS | Status: DC | PRN
  Administered 2016-01-24: 12 mg via INTRAVENOUS

## 2016-01-24 MED ORDER — VANCOMYCIN HCL IN DEXTROSE 1-5 GM/200ML-% IV SOLN
1000.0000 mg | Freq: Two times a day (BID) | INTRAVENOUS | Status: AC
  Administered 2016-01-24 – 2016-01-25 (×2): 1000 mg via INTRAVENOUS
  Filled 2016-01-24 (×2): qty 200

## 2016-01-24 MED ORDER — ROCURONIUM BROMIDE 100 MG/10ML IV SOLN
INTRAVENOUS | Status: DC | PRN
  Administered 2016-01-24: 15 mg via INTRAVENOUS
  Administered 2016-01-24 (×3): 10 mg via INTRAVENOUS
  Administered 2016-01-24: 15 mg via INTRAVENOUS
  Administered 2016-01-24: 10 mg via INTRAVENOUS

## 2016-01-24 MED ORDER — CLINDAMYCIN PHOSPHATE 300 MG/50ML IV SOLN
300.0000 mg | Freq: Three times a day (TID) | INTRAVENOUS | Status: AC
  Administered 2016-01-24 – 2016-01-25 (×3): 300 mg via INTRAVENOUS
  Filled 2016-01-24 (×3): qty 50

## 2016-01-24 MED ORDER — ALUM & MAG HYDROXIDE-SIMETH 200-200-20 MG/5ML PO SUSP
15.0000 mL | ORAL | Status: DC | PRN
Start: 2016-01-24 — End: 2016-01-29

## 2016-01-24 MED ORDER — PHENOL 1.4 % MT LIQD
1.0000 | OROMUCOSAL | Status: DC | PRN
  Filled 2016-01-24: qty 177

## 2016-01-24 SURGICAL SUPPLY — 72 items
BALLN ARMADA 14X80X80 (BALLOONS) ×2
BALLN ATLAS 14X40X75 (BALLOONS) ×2
BALLOON ARMADA 14X80X80 (BALLOONS) ×1 IMPLANT
BALLOON ATLAS 14X40X75 (BALLOONS) ×1 IMPLANT
BLADE SURG 15 STRL LF DISP TIS (BLADE) ×1 IMPLANT
BLADE SURG 15 STRL SS (BLADE) ×1
BLADE SURG SZ11 CARB STEEL (BLADE) ×2 IMPLANT
BOOT SUTURE AID YELLOW STND (SUTURE) ×2 IMPLANT
BRUSH SCRUB 4% CHG (MISCELLANEOUS) ×2 IMPLANT
CANNULA 5F STIFF (CANNULA) ×2 IMPLANT
CATH ACCU-VU SIZ PIG 5F 70CM (CATHETERS) ×2 IMPLANT
CATH ANGIO 5F 80CM MHK1-H (CATHETERS) ×2 IMPLANT
CATH BALLN CODA 9X100X32 (BALLOONS) ×2 IMPLANT
CATH KA2 5FR 65CM (CATHETERS) ×2 IMPLANT
CATH SIZING 5F 100CM .035 PIG (CATHETERS) ×2 IMPLANT
CATH TORCON 5FR 0.38 (CATHETERS) ×2 IMPLANT
DEVICE CLOSURE PERCLS PRGLD 6F (VASCULAR PRODUCTS) ×3 IMPLANT
DEVICE PRESTO INFLATION (MISCELLANEOUS) ×4 IMPLANT
DRYSEAL FLEXSHEATH 12FR, 33CM (SHEATH) ×2 IMPLANT
DRYSEAL FLEXSHEATH 18FR 33CM (SHEATH) ×2 IMPLANT
ELECT CAUTERY BLADE 6.4 (BLADE) ×2 IMPLANT
ELECT REM PT RETURN 9FT ADLT (ELECTROSURGICAL) ×4
ELECTRODE REM PT RTRN 9FT ADLT (ELECTROSURGICAL) ×2 IMPLANT
EVICEL 5ML SEALNT HUMAN B (Miscellaneous) ×2 IMPLANT
GLIDEWIRE STIFF .35X180X3 HYDR (WIRE) ×2 IMPLANT
GLOVE BIO SURGEON STRL SZ7 (GLOVE) ×12 IMPLANT
GLOVE SURG SYN 8.0 (GLOVE) ×4 IMPLANT
GOWN STRL REUS W/ TWL LRG LVL3 (GOWN DISPOSABLE) ×2 IMPLANT
GOWN STRL REUS W/ TWL XL LVL3 (GOWN DISPOSABLE) ×2 IMPLANT
GOWN STRL REUS W/TWL LRG LVL3 (GOWN DISPOSABLE) ×2
GOWN STRL REUS W/TWL XL LVL3 (GOWN DISPOSABLE) ×2
GRAFT EXCLUDER LEG 14.5X12 (Endovascular Graft) ×2 IMPLANT
GRAFT EXCLUDER LEG 14.5X14 (Endovascular Graft) ×2 IMPLANT
HEMOSTAT SURGICEL 2X3 (HEMOSTASIS) ×2 IMPLANT
LEG CONTRALATERAL 16X16X13.5 (Endovascular Graft) ×1 IMPLANT
LEG CONTRALATERAL 16X16X9.5 (Endovascular Graft) ×2 IMPLANT
LEG CONTRALATERAL 36X4.5 (Permanent Stent) ×4 IMPLANT
LIQUID BAND (GAUZE/BANDAGES/DRESSINGS) ×2 IMPLANT
LOOP RED MAXI  1X406MM (MISCELLANEOUS) ×2
LOOP VESSEL MAXI 1X406 RED (MISCELLANEOUS) ×2 IMPLANT
LOOP VESSEL MINI 0.8X406 BLUE (MISCELLANEOUS) ×2 IMPLANT
LOOPS BLUE MINI 0.8X406MM (MISCELLANEOUS) ×2
PACK ANGIOGRAPHY (CUSTOM PROCEDURE TRAY) ×6 IMPLANT
PACK BASIN MAJOR ARMC (MISCELLANEOUS) ×2 IMPLANT
PERCLOSE PROGLIDE 6F (VASCULAR PRODUCTS) ×6
SHEATH  FLEX ANSEL 8FRX45 (SHEATH) ×1
SHEATH BRITE TIP 5FRX11 (SHEATH) ×2 IMPLANT
SHEATH BRITE TIP 6FRX11 (SHEATH) ×4 IMPLANT
SHEATH BRITE TIP 8FRX11 (SHEATH) ×4 IMPLANT
SHEATH FLEX ANSEL 8FRX45 (SHEATH) ×1 IMPLANT
SPONGE XRAY 4X4 16PLY STRL (MISCELLANEOUS) ×10 IMPLANT
STENT GRAFT CONTRALAT 16X13.5 (Endovascular Graft) ×1 IMPLANT
SUT MNCRL 4-0 (SUTURE) ×1
SUT MNCRL 4-0 27XMFL (SUTURE) ×1
SUT PROLENE 6 0 BV (SUTURE) ×10 IMPLANT
SUT SILK 2 0 (SUTURE) ×1
SUT SILK 2-0 18XBRD TIE 12 (SUTURE) ×1 IMPLANT
SUT SILK 3 0 (SUTURE) ×1
SUT SILK 3-0 18XBRD TIE 12 (SUTURE) ×1 IMPLANT
SUT SILK 4 0 (SUTURE) ×1
SUT SILK 4-0 18XBRD TIE 12 (SUTURE) ×1 IMPLANT
SUT VIC AB 2-0 CT1 27 (SUTURE) ×2
SUT VIC AB 2-0 CT1 TAPERPNT 27 (SUTURE) ×2 IMPLANT
SUT VICRYL+ 3-0 36IN CT-1 (SUTURE) ×4 IMPLANT
SUTURE MNCRL 4-0 27XMF (SUTURE) ×1 IMPLANT
SYR 20CC LL (SYRINGE) ×2 IMPLANT
TOWEL OR 17X26 4PK STRL BLUE (TOWEL DISPOSABLE) ×4 IMPLANT
TUBING CONTRAST HIGH PRESS 72 (TUBING) ×2 IMPLANT
WIRE AMPLATZ SSTIFF .035X260CM (WIRE) ×4 IMPLANT
WIRE G LUND 35X260X7 (WIRE) ×2 IMPLANT
WIRE J 3MM .035X145CM (WIRE) ×4 IMPLANT
WIRE STIFF LUNDERQUIST 260MM (WIRE) ×2 IMPLANT

## 2016-01-24 NOTE — Progress Notes (Signed)
Dr. Ronalee Belts notified after pt reported feeling SOB during blood transfusion. Transfusion ordered to be stopped. Pts O2 sat WDL. All VSS. Pt resumed sleeping.

## 2016-01-24 NOTE — Progress Notes (Signed)
eLink Physician-Brief Progress Note Patient Name: [name] DOB: 1941/05/27 MRN: [id number]   Date of Service  01/24/2016  HPI/Events of Note  Patient having back and chest pain. Some relief with PO Oxycodone.   eICU Interventions  Will order: 1. 12 Lead EKG STAT. 2. Cycle Troponin.      Intervention Category Intermediate Interventions: Pain - evaluation and management  Sommer,Steven Eugene 01/24/2016, 10:59 PM

## 2016-01-24 NOTE — Consult Note (Signed)
[place] [facility] at [place] NAME: [name]    MR#:  [id number]  DATE OF BIRTH:  08/08/1941  SUBJECTIVE: Patient is having vascular repair for femoral artery aneurysm. Admitted to the vascular service. We are consulted for medical management. Patient received hemodialysis this morning.    CHIEF COMPLAINT:   Chief Complaint  Patient presents with  . Groin Pain    REVIEW OF SYSTEMS:   Review of Systems  Constitutional: Positive for fever and weight loss.  Eyes: Negative for blurred vision.  Respiratory: Positive for shortness of breath. Negative for cough.   Cardiovascular: Negative for chest pain and palpitations.  Genitourinary:       Penile discharge  Musculoskeletal: Negative for myalgias.  Neurological: Negative for dizziness and headaches.  Endo/Heme/Allergies: Does not bruise/bleed easily.     DRUG ALLERGIES:   Allergies  Allergen Reactions  . Penicillins Hives, Itching and Swelling    Has patient had a PCN reaction causing immediate rash, facial/tongue/throat swelling, SOB or lightheadedness with hypotension: Yes Has patient had a PCN reaction causing severe rash involving mucus membranes or skin necrosis: No Has patient had a PCN reaction that required hospitalization No Has patient had a PCN reaction occurring within the last 10 years: No If all of the above answers are "NO", then may proceed with Cephalosporin use.    VITALS:  Blood pressure 126/77, pulse 70, temperature 97.6 F (36.4 C), temperature source Oral, resp. rate 20, height 6\' 2"  (1.88 m), weight 85.095 kg (187 lb 9.6 oz), SpO2 100 %.  PHYSICAL EXAMINATION:  GENERAL:  75 y.o.-year-old patient lying in the bed with no acute distress. ,markedly debilitated EYES: Pupils equal, round, reactive to light and accommodation. No scleral icterus. Extraocular muscles intact.  HEENT: Head atraumatic, normocephalic. Oropharynx and nasopharynx clear.  and is  poor.  NECK:  Supple, no jugular venous distention. No thyroid enlargement, no tenderness.  LUNGS: Normal breath sounds bilaterally, no wheezing, rales,rhonchi or crepitation. No use of accessory muscles of respiration.  CARDIOVASCULAR: S1, S2 normal. No murmurs, rubs, or gallops.  ABDOMEN: Soft, nontender, nondistended. Bowel sounds present. No organomegaly or mass.  EXTREMITIES:  large l tender pulsatile mass present in the right groin.  NEUROLOGIC: Cranial nerves II through XII are intact. Muscle strength 5/5 in all extremities. Sensation intact. Gait not checked.  PSYCHIATRIC: The patient is alert and oriented x 3.  SKIN: No obvious rash, lesion, or ulcer.    LABORATORY PANEL:   CBC  Recent Labs Lab 01/24/16 1058  WBC 7.4  HGB 9.0*  HCT 28.6*  PLT 210   ------------------------------------------------------------------------------------------------------------------  Chemistries   Recent Labs Lab 01/24/16 1058  NA 140  K 4.0  CL 98*  CO2 30  GLUCOSE 92  BUN 31*  CREATININE 2.05*  CALCIUM 8.4*   ------------------------------------------------------------------------------------------------------------------  Cardiac Enzymes No results for input(s): TROPONINI in the last 168 hours. ------------------------------------------------------------------------------------------------------------------  RADIOLOGY:  Dg Chest 1 View  01/23/2016  CLINICAL DATA:  Cough intermittently for several months EXAM: CHEST 1 VIEW COMPARISON:  11/23/2015 FINDINGS: There are trace bilateral pleural effusions. There is bilateral mild interstitial thickening. There is no pneumothorax. There is mild bibasilar atelectasis. There is stable cardiomegaly. The osseous structures are unremarkable. IMPRESSION: Mild CHF. Electronically Signed   By: Kathreen Devoid   On: 01/23/2016 20:04   Ct Angio Ao+bifem W/cm &/or Wo/cm  01/23/2016  CLINICAL DATA:  Right groin pain with known femoral artery  pseudoaneurysm. 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 EXAM: CT ANGIOGRAPHY OF ABDOMINAL AORTA WITH ILIOFEMORAL RUNOFF TECHNIQUE: Multidetector CT imaging of the abdomen, pelvis and lower extremities was performed using the standard protocol during bolus administration of intravenous contrast. Multiplanar CT image reconstructions and MIPs were obtained to evaluate the vascular anatomy. CONTRAST:  100 mL Isovue 370 IV COMPARISON:  Prior CTA of the abdomen and pelvis on 06/09/2015 as well as right groin ultrasound earlier today. FINDINGS: Aorta: The aortic sac surrounding the endograft shows interval enlargement with current maximal dimensions of approximately 5.3 x 6.1 cm compared to maximal diameter on prior study of 5.4 cm. Prominent anterior endoleak again noted extending to the left side of the aortic sac. This again most likely represents an endoleak supplied by the inferior mesenteric artery. There is at least 1 opacified lumbar artery posteriorly to the left of midline at roughly the L4 level. This may represent additional inflow or potentially outflow. There is no evidence of type 1 endoleak. Distal endograft limbs appear normally patent and stable in position. Stable mild stenosis at the origin of the celiac axis. Stable fusiform dilatation of the SMA trunk. Stable fusiform dilatation of the proximal splenic artery and the common hepatic artery. Right Lower Extremity: Right iliac arteries are tortuous and normally patent. The internal iliac artery is occluded by embolization coils. There is an enlarging aneurysm of the right common femoral artery that also involves the proximal SFA. Based on appearance this is most likely an elongated pseudoaneurysm. Maximal dimensions are currently 9.1 x 6.3 cm transversely and 11.9 cm in maximum height. This represents significant enlargement since prior CTA at which time maximal aneurysm dimensions were  approximately 5.9 cm. The right SFA shows diffuse fusiform dilatation calcified plaque with maximum diameter of approximately 14 mm. 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 Just beyond the adductor hiatus and above the knee joint, the popliteal aneurysm measures approximately 4.3 x 4.3 cm. The popliteal artery is tortuous and becomes normal in caliber just below the knee joint. Tibial artery opacification is suboptimal. The posterior tibial artery appears continuously patent into the foot. The peroneal artery also demonstrates patency into the foot. The anterior tibial artery is likely occluded at the mid calf level. Left Lower Extremity: Left native iliac arteries are normally patent. The internal iliac artery is occluded by embolization coils. There is fusiform dilatation of the common femoral artery measuring up to 16 mm in diameter. The SFA also shows fusiform dilatation and measures 17 mm. Aneurysmal disease of the distal SFA and popliteal artery present. At the juncture of the SFA and popliteal artery near the adductor hiatus, aneurysmal disease measures 4.8 x 5.3 cm. Tortuous and aneurysmal popliteal artery continues to the knee joint. Below the knee the popliteal artery shows no significant aneurysmal disease. Tibial evaluation is limited with patency of the peroneal artery visible into the foot. The posterior and anterior tibial arteries likely are occluded in the mid to distal calf. There is stable chronic bilateral hydronephrosis and hydroureter with diffuse bladder wall thickening present. Stable bilateral adrenal nodularity. No evidence of bowel obstruction or inflammation. No free air, free fluid or abscess identified in the abdomen or pelvis. No lymphadenopathy. No hernias are seen. Bony structures demonstrate moderate spondylosis of the lumbar spine. Review of the MIP images confirms the above findings. IMPRESSION: 1. Significant enlargement of right common femoral artery  pseudoaneurysm now measuring nearly 12 cm in maximum height and 9 cm in  maximum transverse diameter. There would be concern of potential impending rupture based on progressive pain and significant enlargement since the prior CTA. Vascular surgical consultation is recommended. 2. Interval enlargement of the aneurysm sac surrounding the endograft after prior EVAR. The aneurysm sac now measures 6.1 cm in greatest diameter compared to 5.4 cm on the prior study. A prominent type 2 endoleak is again noted, most likely supplied by the inferior mesenteric artery with at least one opacified left lumbar artery noted which may be acting as outflow. 3. 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 Tortuous aneurysmal disease of the right popliteal artery measures 4.3 cm in diameter and maximal diameter of aneurysmal disease on the left at the SFA/popliteal junction is 5.3 cm. Tibial evaluation is limited, likely reflecting slow flow. The right anterior tibial artery is likely occluded. The left posterior and anterior tibial arteries are likely occluded. Electronically Signed   By: Aletta Edouard M.D.   On: 01/23/2016 15:48   [place] Extrem Low Right Ltd  01/23/2016  CLINICAL DATA:  Patient presents with a right groin mass with associated pain. Patient with a dialysis patient scheduled for dialysis today. History of right iliac aneurysm. Patient has an aortoiliac stent graft. EXAM: ULTRASOUND RIGHT LOWER EXTREMITY LIMITED TECHNIQUE: Ultrasound examination of the lower extremity soft tissues was performed in the area of clinical concern. COMPARISON:  None FINDINGS: There is a heterogeneous mass with a cystic component in the right inguinal region measuring 8.5 x 6.7 x 6.8 cm. This cystic component measures approximately 8.5 x 5.0 x 5.2 cm. On color Doppler analysis, there is prominent, to and fro blood flow throughout the cystic component. No other abnormalities. IMPRESSION: 1.  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 This was present on the prior CT. There is anterior thrombus with significant turbulent blood flow in the cystic component. Electronically Signed   By: Lajean Manes M.D.   On: 01/23/2016 11:06   Dg Chest Port 1 View  01/24/2016  CLINICAL DATA:  Shortness of breath. EXAM: PORTABLE CHEST 1 VIEW COMPARISON:  01/23/2016 FINDINGS: Cardiac enlargement with mild vascular congestion. Slight interstitial pattern in the lung bases likely indicating edema. Small bilateral pleural effusions and basilar atelectasis. Similar appearance to previous study. IMPRESSION: Cardiac enlargement with mild vascular congestion and mild interstitial edema. Small bilateral pleural effusions with basilar atelectasis. Electronically Signed   By: Lucienne Capers M.D.   On: 01/24/2016 05:32    EKG:   Orders placed or performed during the hospital encounter of 01/23/16  . EKG 12-Lead  . 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 High risk for planned surgery secondary to cardiac arrhythmia, ESRD, COPD. Patient again tomorrow for AAA repair, right femoral aneurysm, bilateral popliteal aneurysm repairs   #2 milky penile discharge. Patient is on vancomycin.Marland Kitchen Urethral cultures obtained. Neurology consult is requested. #3 ESRD on hemodialysis: Patient got short of breath with blood transfusion this morning. So he was taken for emergency hemodialysis. #4 paroxysmal atrial fibrillation: #5 chronic combined systolic and diastolic heart failure #6 COPD: Oxygen dependent.    high risk for cardiac arrest.  Reviewed med records,meds,noted from vascular,HD note,spoke to HD nurse  All the records are reviewed and case discussed with Care Management/Social Workerr. Management plans discussed with the patient, family and they are in agreement.  CODE STATUS:  full  TOTAL TIME TAKING CARE  OF THIS PATIENT: 25  minutes.   POSSIBLE D/C IN 7-8 DAYS, DEPENDING ON CLINICAL CONDITION.   Epifanio Lesches M.D on 01/24/2016 at 12:40 PM  Between 7am to 6pm - Pager - [phone]  After 6pm go to [email or web address] - password [email or web address] [place] [facility]  Office  [phone]  CC: Primary care physician; Dion Body, MD   Note: This dictation was prepared with Dragon dictation along with smaller phrase technology. Any transcriptional errors that result from this process are unintentional.

## 2016-01-24 NOTE — Progress Notes (Signed)
Called Dr. Jannifer Franklin to inquire about restarting heparin sq and that patient is having pain in his chest and back. Referred call to surgery. Called Dr. Ronalee Belts to report pain and if to restart heparin. May restart heparin, but wants stat call to [facility] for pain. [facility] called to ask MD to return call.

## 2016-01-24 NOTE — Progress Notes (Signed)
Pre-hd tx 

## 2016-01-24 NOTE — Transfer of Care (Signed)
Immediate Anesthesia Transfer of Care Note  Patient: [name]  Procedure(s) Performed: Procedure(s): Endovascular Repair/Stent Graft (N/A)  Patient Location: PACU  Anesthesia Type:General  Level of Consciousness: awake and patient cooperative  Airway & Oxygen Therapy: Patient Spontanous Breathing and Patient connected to face mask oxygen  Post-op Assessment: Report given to RN  Post vital signs: Reviewed and stable  Last Vitals:  Filed Vitals:   01/24/16 1119 01/24/16 1705  BP: 126/77 136/79  Pulse: 70 80  Temp: 36.4 C 37.3 C  Resp: 20 18    Last Pain:  Filed Vitals:   01/24/16 1708  PainSc: 0-No pain         Complications: No apparent anesthesia complications

## 2016-01-24 NOTE — Op Note (Signed)
OPERATIVE NOTE   PROCEDURE: 1. US guidance for vascular access left femoral artery 2. Right superficial femoral artery cutdown for placement of endoprosthesis 3. Catheter placement into aorta from bilateral femoral approaches 4. Repair of a suprarenal aneurysm with aorto aortic tube graft using a 36 mm diameter by 4-1/2 cm length aortic cuff 5. Extension of aortic repair proximally using an additional 36 mm diameter by 4-1/2 cm length aortic cuff 6. Placement of a 14 mm diameter proximal by 18 mm diameter distal 10 cm long covered stent in the left iliac artery for occlusive disease and poor flow following aortic stent graft repair 7. Placement of a 14 mm diameter by 14 cm length right iliac artery stent graft for combination of occlusive disease and aneurysm 8. Placement of a 14 mm diameter by 12 cm length and a 16 mm diameter by 14 cm length covered stent in the right common femoral artery and superficial femoral artery to treat a very large right femoral artery aneurysm 9. 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  POST-OPERATIVE DIAGNOSIS: same  SURGEON: Leotis Pain, MD and Hortencia Pilar, MD - Co-surgeons  ANESTHESIA: general  ESTIMATED BLOOD LOSS: 200 cc  FINDING(S): 1.  AAA  SPECIMEN(S):  none  INDICATIONS:   [name] is a 75 y.o. male who presents with multiple major issues. He has a massive right femoral artery aneurysm measuring greater than 8 cm in diameter. He has a previous stent graft repair of his aortoiliac aneurysm but has a type I endoleak with progression of aneurysmal disease into the suprarenal aorta. He has end-stage renal disease and is on dialysis so covering his renal arteries is planned to  treat his aneurysmal disease proximally. He is very debilitated be a poor surgical candidate for open repair of his right femoral artery aneurysm, so endovascular repair is planned. He also has popliteal artery aneurysms that may be addressed at some point in the future. His massive right femoral aneurysm is the most immediate life-threatening issue but his suprarenal aneurysm and growing abdominal aortic aneurysm after previous stent graft repair also major problems. Urgent repair is performed for these reasons. Risks and benefits are discussed..  DESCRIPTION: After obtaining full informed written consent, the patient was brought back to the operating room and placed supine upon the operating table.  The patient received IV antibiotics prior to induction.  After obtaining adequate anesthesia, the patient was prepped and draped in the standard fashion for endovascular AAA repair.  We then began by gaining access to the left femoral artery with US guidance with me working on the right side and Dr. Delana Meyer working on the left side.  The left femoral artery was found to be patent and accessed without difficulty with a needle under ultrasound guidance without difficulty on the left side and permanent images were recorded.  We then placed 2 proglide devices on the left side in a pre-close fashion and placed 8 French sheaths. A transverse incision was created in the proximal to mid thigh and we gained access to the right mid to proximal SFA for placement of the endoprosthesis. The patient was then given  6000 units of intravenous heparin. The Pigtail catheter was placed into the aorta from the right side. Using this image, we selected a 36 mm diameter x 4.5 cm  length aortic cuff extender.  Over a stiff wire, an 80 French sheath was placed up the left side. This was extremely difficult due to marked tortuosity and iliac artery occlusive disease below the previous stent graft on the left. We had to use a Lunderquist wire  and multiple exchanges were required before we could get the sheath to pass. The stent was then placed through the 18 French sheath. This was deployed across the renal arteries, but proximal seal was not stable at this point.  A 36 mm diameter x 4.5 cm length aortic extension cuff was then deployed up to the base of the SMA to exclude the aneurysm and gain seal.  The compliant balloon was used and a good seal was obtained proximally.  We upsized to the 12 French sheath for the right femoral sheath. Imaging performed both from the aorta and from the right superficial femoral artery sheath was placed through the cutdown showed a massive aneurysm in the common femoral artery extending into the proximal superficial femoral artery and distal external iliac artery. There was also marked tortuosity and apparent narrowing in the external iliac artery just below the previous stent graft. He artery had hypogastric artery coil embolization on both sides. To completely exclude the femoral aneurysm as well as the appropriate treatment for his iliac artery disease, a total of 3 stent grafts were placed. The first stent graft in the external iliac artery was a 14 mm diameter by 14 cm length stent. The 2 stents to treat the massive right femoral artery aneurysm or a 14 mm diameter by 12 cm length stent and a 16 mm diameter by 14 cm length stent that went down about 5-6 cm into the superficial femoral artery. This essentially excluded the profunda femoris artery which was small and diminutive, but in such a debilitated patient was likely the only way to successfully treat his massive femoral artery aneurysm. These 3 stents in the right system were treated with a 14 mm diameter noncompliant balloon and a good seal was obtained with no significant flow into the right femoral artery aneurysm and brisk flow in the right iliac artery at this point. To evaluate the left iliac system after the marked difficulty of the sheath placement,  imaging was performed both from the aorta as well as from the left femoral sheath. Near occlusive stenosis was seen in the left external iliac artery just below the previous stent. There was also marked tortuosity. A left iliac extension limb of 16 mm diameter distally and 10 cm length was used to treat this lesion to place a covered stent in for fear of injury from the previous sheath and to treat the occlusive disease. This was postdilated with a 14 mm balloon with an excellent angiographic completion result and no significant residual stenosis, no extravasation, and exclusion of the aneurysm.. At this point we elected to terminate the procedure. We secured the pro glide devices for hemostasis on the femoral artery on the left. The right superficial femoral artery was repaired with 4 interrupted 6-0 Prolene sutures. At the cell was placed in the right femoral incision. The large aneurysm in the right groin was no longer pulsatile to palpation in the right femoral cutdown incision was closed with a 3-0 Vicryl subcutaneous tissue. The skin incision for both sides was closed with a 4-0 Monocryl. Dermabond and pressure dressing were placed. The patient was taken to the recovery room in stable condition having tolerated the procedure well.  COMPLICATIONS: none  CONDITION: stable  DEW,JASON  01/24/2016, 5:03 PM

## 2016-01-24 NOTE — Progress Notes (Signed)
Post hd tx 

## 2016-01-24 NOTE — Progress Notes (Signed)
Hemodialysis tx start 

## 2016-01-24 NOTE — Progress Notes (Signed)
[facility]  ROUNDING NOTE   Subjective:  Patient came in with right groin pain. He is found to have an 8cm common femoral aneursym and expanding AAA with hx of stent graft.  He was subsequently admitted by Dr. Delana Meyer.     Objective:  Vital signs in last 24 hours:  Temp:  [97.3 F (36.3 C)-98.1 F (36.7 C)] 98 F (36.7 C) (05/03 0251) Pulse Rate:  [40-74] 40 (05/03 0414) Resp:  [15-29] 16 (05/03 0251) BP: (120-158)/(69-91) 158/89 mmHg (05/03 0414) SpO2:  [79 %-100 %] 79 % (05/03 0414) Weight:  [84.823 kg (187 lb)-85.095 kg (187 lb 9.6 oz)] 85.095 kg (187 lb 9.6 oz) (05/02 2050)  Weight change:  Filed Weights   01/23/16 0904 01/23/16 2050  Weight: 84.823 kg (187 lb) 85.095 kg (187 lb 9.6 oz)    Intake/Output: I/O last 3 completed shifts: In: 283 [Blood:283] Out: -    Intake/Output this shift:     Physical Exam: General: NAD, resting in bed  Head: Normocephalic, atraumatic. Moist oral mucosal membranes  Eyes: Anicteric, PERRL  Neck: Supple, trachea midline  Lungs:  Basilar rales, slightly increased work of breathing  Heart: irregular  Abdomen:  Soft, mild abdominal tenderness as well as right groin tenderness  Extremities: trace peripheral edema.  Neurologic: Nonfocal, moving all four extremities  Skin: No lesions  Access: LUE AVF    Basic Metabolic Panel:  Recent Labs Lab 01/23/16 0909  NA 140  K 4.5  CL 102  CO2 30  GLUCOSE 85  BUN 63*  CREATININE 3.76*  CALCIUM 8.4*    Liver Function Tests: No results for input(s): AST, ALT, ALKPHOS, BILITOT, PROT, ALBUMIN in the last 168 hours. No results for input(s): LIPASE, AMYLASE in the last 168 hours. No results for input(s): AMMONIA in the last 168 hours.  CBC: No results for input(s): WBC, NEUTROABS, HGB, HCT, MCV, PLT in the last 168 hours.  Cardiac Enzymes: No results for input(s): CKTOTAL, CKMB, CKMBINDEX, TROPONINI in the last 168 hours.  BNP: Invalid input(s):  POCBNP  CBG: No results for input(s): GLUCAP in the last 168 hours.  Microbiology: Results for orders placed or performed during the hospital encounter of 01/23/16  MRSA PCR Screening     Status: None   Collection Time: 01/24/16 12:02 AM  Result Value Ref Range Status   MRSA by PCR NEGATIVE NEGATIVE Final    Comment:        The GeneXpert MRSA Assay (FDA approved for NASAL specimens only), is one component of a comprehensive MRSA colonization surveillance program. It is not intended to diagnose MRSA infection nor to guide or monitor treatment for MRSA infections.     Coagulation Studies: No results for input(s): LABPROT, INR in the last 72 hours.  Urinalysis: No results for input(s): COLORURINE, LABSPEC, PHURINE, GLUCOSEU, HGBUR, BILIRUBINUR, KETONESUR, PROTEINUR, UROBILINOGEN, NITRITE, LEUKOCYTESUR in the last 72 hours.  Invalid input(s): APPERANCEUR    Imaging: Dg Chest 1 View  01/23/2016  CLINICAL DATA:  Cough intermittently for several months EXAM: CHEST 1 VIEW COMPARISON:  11/23/2015 FINDINGS: There are trace bilateral pleural effusions. There is bilateral mild interstitial thickening. There is no pneumothorax. There is mild bibasilar atelectasis. There is stable cardiomegaly. The osseous structures are unremarkable. IMPRESSION: Mild CHF. 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 History of EVAR to  repair abdominal aortic aneurysm with previous demonstration of type 2 endoleak. EXAM: CT ANGIOGRAPHY OF ABDOMINAL AORTA WITH ILIOFEMORAL RUNOFF TECHNIQUE: Multidetector CT imaging of the abdomen, pelvis and lower extremities was performed using the standard protocol during bolus administration of intravenous contrast. Multiplanar CT image reconstructions and MIPs were obtained to evaluate the vascular anatomy. CONTRAST:  100 mL Isovue 370  IV COMPARISON:  Prior CTA of the abdomen and pelvis on 06/09/2015 as well as right groin ultrasound earlier today. FINDINGS: Aorta: The aortic sac surrounding the endograft shows interval enlargement with current maximal dimensions of approximately 5.3 x 6.1 cm compared to maximal diameter on prior study of 5.4 cm. Prominent anterior endoleak again noted extending to the left side of the aortic sac. This again most likely represents an endoleak supplied by the inferior mesenteric artery. There is at least 1 opacified lumbar artery posteriorly to the left of midline at roughly the L4 level. This may represent additional inflow or potentially outflow. There is no evidence of type 1 endoleak. Distal endograft limbs appear normally patent and stable in position. Stable mild stenosis at the origin of the celiac axis. Stable fusiform dilatation of the SMA trunk. Stable fusiform dilatation of the proximal splenic artery and the common hepatic artery. Right Lower Extremity: Right iliac arteries are tortuous and normally patent. The internal iliac artery is occluded by embolization coils. There is an enlarging aneurysm of the right common femoral artery that also involves the proximal SFA. Based on appearance this is most likely an elongated pseudoaneurysm. Maximal dimensions are currently 9.1 x 6.3 cm transversely and 11.9 cm in maximum height. This represents significant enlargement since prior CTA at which time maximal aneurysm dimensions were approximately 5.9 cm. The right SFA shows diffuse fusiform dilatation calcified plaque with maximum diameter of approximately 14 mm. 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 Just beyond the adductor hiatus and above the knee joint, the popliteal aneurysm measures approximately 4.3 x 4.3 cm. The popliteal artery is tortuous and becomes normal in caliber just below the knee joint. Tibial artery opacification is suboptimal. The posterior tibial artery appears  continuously patent into the foot. The peroneal artery also demonstrates patency into the foot. The anterior tibial artery is likely occluded at the mid calf level. Left Lower Extremity: Left native iliac arteries are normally patent. The internal iliac artery is occluded by embolization coils. There is fusiform dilatation of the common femoral artery measuring up to 16 mm in diameter. The SFA also shows fusiform dilatation and measures 17 mm. Aneurysmal disease of the distal SFA and popliteal artery present. At the juncture of the SFA and popliteal artery near the adductor hiatus, aneurysmal disease measures 4.8 x 5.3 cm. Tortuous and aneurysmal popliteal artery continues to the knee joint. Below the knee the popliteal artery shows no significant aneurysmal disease. Tibial evaluation is limited with patency of the peroneal artery visible into the foot. The posterior and anterior tibial arteries likely are occluded in the mid to distal calf. There is stable chronic bilateral hydronephrosis and hydroureter with diffuse bladder wall thickening present. Stable bilateral adrenal nodularity. No evidence of bowel obstruction or inflammation. No free air, free fluid or abscess identified in the abdomen or pelvis. No lymphadenopathy. No hernias are seen. Bony structures demonstrate moderate spondylosis of the lumbar spine. Review of the MIP images confirms the above findings. IMPRESSION: 1. Significant enlargement of right common femoral artery pseudoaneurysm now measuring nearly 12 cm in maximum height and 9 cm in maximum  transverse diameter. There would be concern of potential impending rupture based on progressive pain and significant enlargement since the prior CTA. Vascular surgical consultation is recommended. 2. Interval enlargement of the aneurysm sac surrounding the endograft after prior EVAR. The aneurysm sac now measures 6.1 cm in greatest diameter compared to 5.4 cm on the prior study. A prominent type 2  endoleak is again noted, most likely supplied by the inferior mesenteric artery with at least one opacified left lumbar artery noted which may be acting as outflow. 3. 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 Tortuous aneurysmal disease of the right popliteal artery measures 4.3 cm in diameter and maximal diameter of aneurysmal disease on the left at the SFA/popliteal junction is 5.3 cm. Tibial evaluation is limited, likely reflecting slow flow. The right anterior tibial artery is likely occluded. The left posterior and anterior tibial arteries are likely occluded. Electronically Signed   By: Aletta Edouard M.D.   On: 01/23/2016 15:48   [place] Extrem Low Right Ltd  01/23/2016  CLINICAL DATA:  Patient presents with a right groin mass with associated pain. Patient with a dialysis patient scheduled for dialysis today. History of right iliac aneurysm. Patient has an aortoiliac stent graft. EXAM: ULTRASOUND RIGHT LOWER EXTREMITY LIMITED TECHNIQUE: Ultrasound examination of the lower extremity soft tissues was performed in the area of clinical concern. COMPARISON:  None FINDINGS: There is a heterogeneous mass with a cystic component in the right inguinal region measuring 8.5 x 6.7 x 6.8 cm. This cystic component measures approximately 8.5 x 5.0 x 5.2 cm. On color Doppler analysis, there is prominent, to and fro blood flow throughout the cystic component. No other abnormalities. IMPRESSION: 1. 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 This was present on the prior CT. There is anterior thrombus with significant turbulent blood flow in the cystic component. Electronically Signed   By: Lajean Manes M.D.   On: 01/23/2016 11:06   Dg Chest Port 1 View  01/24/2016  CLINICAL DATA:  Shortness of breath. EXAM: PORTABLE CHEST 1 VIEW COMPARISON:  01/23/2016 FINDINGS: Cardiac enlargement with mild vascular congestion. Slight interstitial pattern in the lung  bases likely indicating edema. Small bilateral pleural effusions and basilar atelectasis. Similar appearance to previous study. IMPRESSION: Cardiac enlargement with mild vascular congestion and mild interstitial edema. Small bilateral pleural effusions with basilar atelectasis. Electronically Signed   By: Lucienne Capers M.D.   On: 01/24/2016 05:32     Medications:     . amiodarone  200 mg Oral Daily  . amLODipine  2.5 mg Oral Daily  . atorvastatin  40 mg Oral QHS  . docusate sodium  100 mg Oral Daily  . ferrous sulfate  325 mg Oral Daily  . guaiFENesin  600 mg Oral BID  . heparin  5,000 Units Subcutaneous Q8H  . magnesium oxide  400 mg Oral QPM  . mometasone-formoterol  2 puff Inhalation BID  . sevelamer carbonate  800 mg Oral TID AC  . tiotropium  18 mcg Inhalation Daily  . vancomycin  1,000 mg Intravenous To OR   sodium chloride, acetaminophen **OR** acetaminophen, acetaminophen, albuterol, loperamide, magnesium sulfate 1 - 4 g bolus IVPB, morphine injection, ondansetron, oxyCODONE, phenol, polyethylene glycol, zolpidem  Assessment/ Plan:  75 y.o. 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  ESRD on HD TTHS:  Patient became short of breath this AM post blood administration.  Therefore he is in need of dialysis preprocedure today, he is seen and evaluated during HD and tolerating well thus far.  UF target set at 3kg for now.  Will reevaluate for need of HD tomorrow as well.  2.  Anemia of CKD:  Given prbc's today, will continue to monitor CBC.    3.  SHPTH:  Check ipth and  phos today.  Continue renvela.   4.  HTN:  Continue amlodipine.   5.  AAA with type I endoleak and right common femoral aneurysm:  Vascular surgery consulted, hopefully he can endovascular repair.  Appreciate Dr. Hal Morales input.    LOS: 1 Jeanae Whitmill 5/3/20177:15 AM

## 2016-01-24 NOTE — Anesthesia Preprocedure Evaluation (Addendum)
Anesthesia Evaluation  Patient identified by MRN, date of birth, ID band Patient awake    Reviewed: Allergy & Precautions, H&P , NPO status , Patient's Chart, lab work & pertinent test results, reviewed documented beta blocker date and time   History of Anesthesia Complications Negative for: history of anesthetic complications  Airway Mallampati: III  TM Distance: >3 FB Neck ROM: limited    Dental  (+) Missing, Poor Dentition   Pulmonary shortness of breath and with exertion, neg sleep apnea, pneumonia, COPD,  COPD inhaler and oxygen dependent, neg recent URI, former smoker,    Pulmonary exam normal breath sounds clear to auscultation       Cardiovascular Exercise Tolerance: Poor hypertension, On Medications + CAD, + Peripheral Vascular Disease and +CHF  (-) Past MI, (-) Cardiac Stents and (-) CABG Normal cardiovascular exam+ dysrhythmias Atrial Fibrillation (-) Valvular Problems/Murmurs Rhythm:regular Rate:Normal     Neuro/Psych neg Seizures PSYCHIATRIC DISORDERS (Dementia) TIA   GI/Hepatic Neg liver ROS, GERD  ,  Endo/Other  diabetes  Renal/GU ESRF and DialysisRenal disease  negative genitourinary   Musculoskeletal   Abdominal   Peds  Hematology  (+) Blood dyscrasia, anemia ,   Anesthesia Other Findings Past Medical History:   PAF (paroxysmal atrial fibrillation) (HCC)                     Comment:a. new onset 12/2014 in the setting of UTI,               sepsis, hypotension, and anemia; b. not on long              term anticoagulation given anemia; c. family               aware of stroke risk, they are ok with this; d.              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 echo 12/2014: EF 25-30%, anterior wall wall               motion abnormalities; b. planned ischemic               evaluation once patient is stable medically; c.              echo 01/2015: EF  45-50%, no RWMA, mild AT, LA               mildly dilated, mod pericardial effusion along               LV free wall, no evidence of hemodynamic               compromise   ESRD on hemodialysis ([place])                                     Comment:a. Tuesday, Thursday, and Saturdays   Anemia                                                         Comment:a. baseline hgb ~ 8   History of small bowel obstruction  Comment:a. 01/2015   History of septic shock                                        Comment:a. 01/2015   GERD (gastroesophageal reflux disease)                       Allergic rhinitis                                            COPD (chronic obstructive pulmonary disease) (*              Hypercholesteremia                                           Cognitive communication deficit                              Magnesium deficiency                                         Dementia                                                     Iliac aneurysm (HCC)                                         Incontinence                                                 Hydronephrosis                                               Overactive bladder                                           Detrusor sphincter dyssynergia                               Mini stroke ([place])                                            Bladder cancer ([place])  Prostate cancer [facility])                                        Renal insufficiency                                          Hypertension                                                 Laryngeal polyps  Reproductive/Obstetrics negative OB ROS                            Anesthesia Physical Anesthesia Plan  ASA: IV  Anesthesia Plan: General   Post-op Pain Management:    Induction:   Airway Management Planned:   Additional Equipment:   Intra-op Plan:   Post-operative Plan:    Informed Consent: I have reviewed the patients History and Physical, chart, labs and discussed the procedure including the risks, benefits and alternatives for the proposed anesthesia with the patient or authorized representative who has indicated his/her understanding and acceptance.   Dental Advisory Given  Plan Discussed with: Anesthesiologist, CRNA and Surgeon  Anesthesia Plan Comments:        Anesthesia Quick Evaluation

## 2016-01-24 NOTE — Progress Notes (Addendum)
Called re: respiratory distress.  Patient SOB on 4 L O2 via [place] and feels better at rest with supplemental oxygen.  CXR order.  Patient missed dialysis yesterday and received 1 unit PRBCs today.  Sound fluid overloaded.  Patient does not make urine.  Nephology called.  Patient will be scheduled for first shift HD.  Also, patient has copious penile discharge.  No evidence of balanitis.  Reports no sexual activity for years.  Swab sent for culture (not urethral).  Urulogy consult placed.

## 2016-01-24 NOTE — Anesthesia Postprocedure Evaluation (Signed)
Anesthesia Post Note  Patient: [name]  Procedure(s) Performed: Procedure(s) (LRB): Endovascular Repair/Stent Graft (N/A)  Patient location during evaluation: PACU Anesthesia Type: General Level of consciousness: awake and alert Pain management: pain level controlled Vital Signs Assessment: post-procedure vital signs reviewed and stable Respiratory status: spontaneous breathing, nonlabored ventilation, respiratory function stable and patient connected to nasal cannula oxygen Cardiovascular status: blood pressure returned to baseline and stable Postop Assessment: no signs of nausea or vomiting Anesthetic complications: no    Last Vitals:  Filed Vitals:   01/24/16 1745 01/24/16 1817  BP: 100/57 107/59  Pulse: 70 66  Temp:  36.8 C  Resp: 20 21    Last Pain:  Filed Vitals:   01/24/16 1819  PainSc: 3                  [name]

## 2016-01-24 NOTE — Op Note (Signed)
[facility]   PROCEDURE NOTE  PROCEDURE: 1. Right internal jugular triple lumen central venous catheter placement 2. Right internal jugular cannulation under ultrasound guidance  PRE-OPERATIVE DIAGNOSIS: AAA, right femoral aneurysm, bilateral popliteal aneurysms  POST-OPERATIVE DIAGNOSIS: same as above  SURGEON: Punam Broussard, MD  ANESTHESIA:  None  ESTIMATED BLOOD LOSS: minimal  FINDING(S): none  SPECIMEN(S):  none  INDICATIONS:   [name] is a 75 y.o. male who presents with need for venous access.  The patient presents for central venous catheter placement.  The patient is aware the risks of central venous catheter placement include but are not limited to: bleeding, infection, central venous injury, pneumothorax, possible venous stenosis, possible malpositioning in the venous system, and possible infections related to long-term catheter presence. The patient was aware of these risks and agreed to proceed.  DESCRIPTION: After written informed consent was obtained from the patient and/or family, the patient was placed supine in the hospital bed.  The patient was prepped with chloraprep and draped in the standard fashion for a chest or neck central venous catheter placement.  I anesthesized the neck cannulation site with 1% lidocaine then under ultrasound guidance, the right internal jugular vein was cannulated with the 18 gauge needle.  A J wire was then placed down in the superior vena cava.  After a skin nick and dilatation, the triple lumen central venous catheter was placed over the wire and the wire was removed.  Each port was aspirated and flushed with sterile normal saline.  The catheter was secured in placed with three interrupted stitches of 3-0 Silk tied to the catheter.  The catheter was dressed with sterile dressing.  Stat CXR is pending  COMPLICATIONS: none apparent  CONDITION: stable  [name] 01/24/2016, 12:43 PM

## 2016-01-24 NOTE — Op Note (Signed)
OPERATIVE NOTE   PROCEDURE: 1. US guidance for vascular access left femoral artery 2. Open cutdown right superficial femoral artery 3. Catheter placement into aorta from bilateral femoral approaches 4. Placement of a 36 x 4.5 Gore Excluder aortic cuff Endoprosthesis for treatment of a suprarenal aortic aneurysm 5. Extension of suprarenal aortic aneurysm repair using a 36 x 4.5 Gore Excluder aortic cuff endoprosthesis 6. Placement of a 14 x 18 x 10 cm Gore covered stent left external iliac artery for treatment of external iliac artery stenosis and occlusion 7. 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 Placement of a 14 x 12 x 16 in combination with a 16 x 14 x 14 Gore covered stents right common femoral and right superficial femoral arteries for treatment of an 8 cm common femoral artery aneurysm 9. 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  Suprarenal AAA; greater than 70% stenoses bilateral external iliac arteries; massive common femoral artery aneurysm   SPECIMEN(S):  none  INDICATIONS:   [name] is a 75 y.o. y.o. male who presents emergently to the emergency room with increasing pain in the right groin. CT scan demonstrates a large suprarenal abdominal aortic aneurysm consistent with deterioration of the visceral segment of the abdominal aorta now causing a  type I endoleak with reperfusion of his abdominal aortic aneurysm which has increased in size to greater than 6 cm. 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 The patient is extremely debilitated he is on home O2 with very poor lung function cardiomyopathy atrial fibrillation and end-stage renal disease. He is maintained on hemodialysis and has been for many months and therefore his renal artery perfusion is no longer an issue. He is not felt to be an open surgical candidate. Given this constellation of findings and comorbidities the risks and benefits for endovascular repair of both life-threatening aneurysms was discussed with the patient all questions have been answered the patient wishes to proceed he does not wish to die of ruptured aneurysms he states wishes to live as long as possible. Therefore, he has agreed to endovascular repair.  DESCRIPTION: After obtaining full informed written consent, the patient was brought back to special procedures and placed supine upon the operating table.  The patient received IV antibiotics prior to induction.  After obtaining adequate anesthesia, the patient was prepped and draped in the standard fashion for endovascular AAA repair.    With Dr. Lucky Cowboy working on the patient's right side and myself working on the left side we began by both accessing the left common femoral as well as performing the cut down on the right. Ultrasound was used to image the common femoral artery on the left was pulsatile echolucent indicating patency and image was recorded for the permanent record. Seldinger needle was inserted followed by an 035 wire and subsequently a 5 French sheath was placed. Using a KMP catheter and a  J-wire the wire was negotiated into the aorta with the catheter subsequent catheter was removed and 2 Perclose devices were used in a pre-close fashion in preparation for placement of the large sheath.  With  Dr. Lucky Cowboy working on the right the linear incision was created in the skin and dissection carried down through the soft tissues to expose the fascia which was incised and the sartorius identified this was then reflected exposing the superficial femoral artery below the level of the aneurysm that is distally. Access to the superficial femoral artery was performed with a Seldinger needle under direct visualization and a wire was advanced through the aneurysm and into the aorta iliac system.  5000 units of heparin was given and allowed to circulate for several minutes. During the case an additional 1000 units was given.  With the pigtail catheter advanced up the right side imaging of the visceral segment of the suprarenal aorta was obtained in both AP and oblique views. Subsequently a 36 diameter by 45 cm length aortic extender cuff was selected and opened on the field. Through a series of manipulations with both catheters Amplatz and ultimately a Lunderquist wire as well as a long 8 French sheath wire purchase using the Lunderquist was obtained and the 18 French sheath advanced so that the tip was in the aorta proper. Dilator was removed and the 36 x 45 cuff advanced into position. Cuff was subsequently deployed but this did not provide adequate seal or stability. Follow-up imaging demonstrated adequate distance to the level of the SMA and therefore a second extender cuff a 36 x 45 was also advanced through the left 18 French sheath and positioned so that it was just below the SMA origin. It was deployed without difficulty. Noncompliant balloon was then advanced up the left side and the 2 cuffs were angioplastied. Follow-up imaging demonstrated there was now seal the proximal edge of the extender cuffs was just 2-3 mm below the SMA essentially maximizing the zone of seal. It should be noted the renal arteries were covered however the patient is has been on dialysis for many months and therefore renal perfusion is a  nonissue.  Amplatz Super Stiff wire was then advanced up the right side and a 12 French sheath was inserted. Oblique imaging was then performed of the right demonstrating high-grade strictures as well as aneurysmal deterioration of the right external iliac artery.  With a marker pigtail as a reference the 14 x 14 Gore covered iliac stent was selected and deployed extending from the previous distal endpoint across the external iliac. Repeat imaging now of the common femoral superficial femoral aneurysm was performed again with a marker pigtail as reference and a 14 x 12 Gore covered stent was advanced and deployed overlapping slightly the external iliac stent this however was not adequate to exclude the common femoral artery aneurysm and therefore a 16 x 14 Gore covered stent was added extending the repair down into the SFA. The stents were then postdilated with a 14 mm balloon inflated to 8-10 atm.  Detector catheter was then advanced up the right side alongside the Lunderquist wire the catheter was positioned in the main body and oblique views of the right iliofemoral arterial system were performed. No type I endoleak noted in fact on very delayed imaging there is only slight filling of the aneurysm from branches including the profunda. By physical examination the femoral aneurysm was already decompressed somewhat and the pulsatility previously noted had completely resolved and was no longer  palpable.  Attention was then turned to the left where on imaging of the right contrast was noted only on the right side absence on the left indicating the occlusion in the external iliac. By pulling the sheath down into the common femoral retrograde injection demonstrated occlusion of the left external iliac given this finding a 14 x 18 x 10 cm long Gore covered stent was advanced up to the previous distal endpoint and deployed. It too was postdilated with a 14 mm balloon. Follow-up imaging from the pigtail catheter on  the left side which was positioned within the proximal aorta demonstrated rapid flow of contrast with wide patency of the iliac system.  Again in review and interpretation of the post-stenting imaging there is now seal in the proximal edge in the suprarenal aneurysm and the infrarenal 6+ centimeter aneurysm is been excluded with elimination of the type I endoleak. The atherosclerotic occlusive disease within the external iliac arteries bilaterally is now well treated and completely resolved. The common femoral and superficial femoral artery aneurysm has now been well treated and there is no significant type I endoleak. There is a very faint type II endoleak noted.  At this point we elected to terminate the procedure the pro glide devices on the left were secured with good hemostasis. On the right the superficial femoral artery was repaired with interrupted 6-0 Prolene sutures a total of 4 sutures were required. AdvaSeal was laced at the repair site and therefore the cutdown was repaired in layers using 3-0 Vicryl followed by 4-0 Monocryl subcuticular for both the right and left incisions. Dermabond was then placed. The patient was taken to the recovery room having tolerated the procedure without hemodynamic instability in stable condition.   COMPLICATIONS: none  CONDITION: stable  Katha Cabal  01/28/2015, 3:51 PM

## 2016-01-24 NOTE — Progress Notes (Signed)
Hemodialysis completed. 

## 2016-01-24 NOTE — Progress Notes (Signed)
NTs had gone in to change pt. Pt reported feeling very SOB. Pt given PRN nebulizer tx. Pts O2 was WDL. Pts VSS. Pt continued to report discomfort and displayed labored breathing. Hospitalist called and saw pt. Orders for chest x-ray and stat nephrology consult placed. Pt to receive early moring dialysis. Pt is now resting comfortably.

## 2016-01-24 NOTE — Progress Notes (Signed)
Dr. Oletta Darter ordered stat EKG and troponin. EKG is SR with PVCs. Awaiting troponin results.

## 2016-01-24 NOTE — Progress Notes (Signed)
When performing initial assessment on pt. Pt was noted to have milky penile discharge. Notified Dr. Ronalee Belts who then ordered a fungal culture.

## 2016-01-24 NOTE — Anesthesia Procedure Notes (Signed)
Procedure Name: Intubation Date/Time: 01/24/2016 1:10 PM Performed by: Dionne Bucy Pre-anesthesia Checklist: Patient identified, Patient being monitored, Timeout performed, Emergency Drugs available and Suction available Patient Re-evaluated:Patient Re-evaluated prior to inductionOxygen Delivery Method: Circle system utilized Preoxygenation: Pre-oxygenation with 100% oxygen Intubation Type: IV induction Ventilation: Mask ventilation without difficulty Laryngoscope Size: Mac and 4 Grade View: Grade II Tube type: Oral Tube size: 7.0 mm Number of attempts: 1 Airway Equipment and Method: Stylet Placement Confirmation: ETT inserted through vocal cords under direct vision,  positive ETCO2 and breath sounds checked- equal and bilateral Secured at: 23 cm Tube secured with: Tape Dental Injury: Teeth and Oropharynx as per pre-operative assessment

## 2016-01-25 ENCOUNTER — Telehealth: Payer: Self-pay

## 2016-01-25 DIAGNOSIS — N1330 Unspecified hydronephrosis: Secondary | ICD-10-CM

## 2016-01-25 DIAGNOSIS — R369 Urethral discharge, unspecified: Secondary | ICD-10-CM

## 2016-01-25 DIAGNOSIS — C61 Malignant neoplasm of prostate: Secondary | ICD-10-CM

## 2016-01-25 DIAGNOSIS — Z8551 Personal history of malignant neoplasm of bladder: Secondary | ICD-10-CM

## 2016-01-25 LAB — TROPONIN I
TROPONIN I: 0.03 ng/mL (ref <0.031)
TROPONIN I: 0.04 ng/mL — AB (ref <0.031)

## 2016-01-25 LAB — TYPE AND SCREEN
ABO/RH(D): A NEG
Antibody Screen: NEGATIVE
UNIT DIVISION: 0
UNIT DIVISION: 0
Unit division: 0
Unit division: 0

## 2016-01-25 LAB — PARATHYROID HORMONE, INTACT (NO CA): PTH: 198 pg/mL — ABNORMAL HIGH (ref 15–65)

## 2016-01-25 LAB — PREPARE RBC (CROSSMATCH)

## 2016-01-25 LAB — HEPATITIS B SURFACE ANTIGEN: Hepatitis B Surface Ag: NEGATIVE

## 2016-01-25 MED ORDER — FAMOTIDINE IN NACL 20-0.9 MG/50ML-% IV SOLN
20.0000 mg | INTRAVENOUS | Status: DC
  Administered 2016-01-27 – 2016-01-29 (×2): 20 mg via INTRAVENOUS
  Filled 2016-01-25 (×2): qty 50

## 2016-01-25 NOTE — Progress Notes (Signed)
[facility]  ROUNDING NOTE   Subjective:  Patient had significant vascular intervention and had endovascular repair of abdominal aortic aneurysm leak as well as treatment of right common femoral artery aneurysm.   Objective:  Vital signs in last 24 hours:  Temp:  [97.6 F (36.4 C)-99.2 F (37.3 C)] 97.6 F (36.4 C) (05/04 0700) Pulse Rate:  [66-80] 73 (05/04 0800) Resp:  [18-32] 20 (05/04 0800) BP: (91-148)/(49-97) 102/60 mmHg (05/04 0800) SpO2:  [94 %-100 %] 99 % (05/04 0800)  Weight change:  Filed Weights   01/23/16 0904 01/23/16 2050 01/24/16 0700  Weight: 84.823 kg (187 lb) 85.095 kg (187 lb 9.6 oz) 85.095 kg (187 lb 9.6 oz)    Intake/Output: I/O last 3 completed shifts: In: 2728 [I.V.:1795; Blood:283; IV Piggyback:650] Out: 3400 [Urine:200; Other:3000; Blood:200]   Intake/Output this shift:  Total I/O In: 75 [P.O.:75] Out: -   Physical Exam: General: NAD, resting in bed  Head: Normocephalic, atraumatic. Moist oral mucosal membranes  Eyes: Anicteric, PERRL  Neck: Supple, trachea midline  Lungs:  Basilar rales, slightly increased work of breathing  Heart: irregular  Abdomen:  Soft, non tender, non distended  Extremities: trace peripheral edema.  Neurologic: Nonfocal, moving all four extremities  Skin: No lesions  Access: LUE AVF    Basic Metabolic Panel:  Recent Labs Lab 01/23/16 0909 01/24/16 0756 01/24/16 1058  NA 140  --  140  K 4.5  --  4.0  CL 102  --  98*  CO2 30  --  30  GLUCOSE 85  --  92  BUN 63*  --  31*  CREATININE 3.76*  --  2.05*  CALCIUM 8.4*  --  8.4*  PHOS  --  4.3  --     Liver Function Tests: No results for input(s): AST, ALT, ALKPHOS, BILITOT, PROT, ALBUMIN in the last 168 hours. No results for input(s): LIPASE, AMYLASE in the last 168 hours. No results for input(s): AMMONIA in the last 168 hours.  CBC:  Recent Labs Lab 01/24/16 1058  WBC 7.4  HGB 9.0*  HCT 28.6*  MCV 85.2  PLT 210    Cardiac  Enzymes:  Recent Labs Lab 01/24/16 2311 01/25/16 0504  TROPONINI 0.03 0.03    BNP: Invalid input(s): POCBNP  CBG:  Recent Labs Lab 01/24/16 1821 01/24/16 2031  GLUCAP 80 98    Microbiology: Results for orders placed or performed during the hospital encounter of 01/23/16  Culture, fungus without smear ([facility]-Only)     Status: None (Preliminary result)   Collection Time: 01/23/16 10:03 PM  Result Value Ref Range Status   Specimen Description CATH TIP  Final   Special Requests NONE  Final   Culture NO FUNGUS ISOLATED <24 HOURS  Final   Report Status PENDING  Incomplete  MRSA PCR Screening     Status: None   Collection Time: 01/24/16 12:02 AM  Result Value Ref Range Status   MRSA by PCR NEGATIVE NEGATIVE Final    Comment:        The GeneXpert MRSA Assay (FDA approved for NASAL specimens only), is one component of a comprehensive MRSA colonization surveillance program. It is not intended to diagnose MRSA infection nor to guide or monitor treatment for MRSA infections.     Coagulation Studies: No results for input(s): LABPROT, INR in the last 72 hours.  Urinalysis: No results for input(s): COLORURINE, LABSPEC, PHURINE, GLUCOSEU, HGBUR, BILIRUBINUR, KETONESUR, PROTEINUR, UROBILINOGEN, NITRITE, LEUKOCYTESUR in the last 72 hours.  Invalid  input(s): APPERANCEUR    Imaging: Dg Chest 1 View  01/23/2016  CLINICAL DATA:  Cough intermittently for several months EXAM: CHEST 1 VIEW COMPARISON:  11/23/2015 FINDINGS: There are trace bilateral pleural effusions. There is bilateral mild interstitial thickening. There is no pneumothorax. There is mild bibasilar atelectasis. There is stable cardiomegaly. The osseous structures are unremarkable. IMPRESSION: Mild CHF. 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 History of EVAR to repair  abdominal aortic aneurysm with previous demonstration of type 2 endoleak. EXAM: CT ANGIOGRAPHY OF ABDOMINAL AORTA WITH ILIOFEMORAL RUNOFF TECHNIQUE: Multidetector CT imaging of the abdomen, pelvis and lower extremities was performed using the standard protocol during bolus administration of intravenous contrast. Multiplanar CT image reconstructions and MIPs were obtained to evaluate the vascular anatomy. CONTRAST:  100 mL Isovue 370 IV COMPARISON:  Prior CTA of the abdomen and pelvis on 06/09/2015 as well as right groin ultrasound earlier today. FINDINGS: Aorta: The aortic sac surrounding the endograft shows interval enlargement with current maximal dimensions of approximately 5.3 x 6.1 cm compared to maximal diameter on prior study of 5.4 cm. Prominent anterior endoleak again noted extending to the left side of the aortic sac. This again most likely represents an endoleak supplied by the inferior mesenteric artery. There is at least 1 opacified lumbar artery posteriorly to the left of midline at roughly the L4 level. This may represent additional inflow or potentially outflow. There is no evidence of type 1 endoleak. Distal endograft limbs appear normally patent and stable in position. Stable mild stenosis at the origin of the celiac axis. Stable fusiform dilatation of the SMA trunk. Stable fusiform dilatation of the proximal splenic artery and the common hepatic artery. Right Lower Extremity: Right iliac arteries are tortuous and normally patent. The internal iliac artery is occluded by embolization coils. There is an enlarging aneurysm of the right common femoral artery that also involves the proximal SFA. Based on appearance this is most likely an elongated pseudoaneurysm. Maximal dimensions are currently 9.1 x 6.3 cm transversely and 11.9 cm in maximum height. This represents significant enlargement since prior CTA at which time maximal aneurysm dimensions were approximately 5.9 cm. The right SFA shows diffuse  fusiform dilatation calcified plaque with maximum diameter of approximately 14 mm. 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 Just beyond the adductor hiatus and above the knee joint, the popliteal aneurysm measures approximately 4.3 x 4.3 cm. The popliteal artery is tortuous and becomes normal in caliber just below the knee joint. Tibial artery opacification is suboptimal. The posterior tibial artery appears continuously patent into the foot. The peroneal artery also demonstrates patency into the foot. The anterior tibial artery is likely occluded at the mid calf level. Left Lower Extremity: Left native iliac arteries are normally patent. The internal iliac artery is occluded by embolization coils. There is fusiform dilatation of the common femoral artery measuring up to 16 mm in diameter. The SFA also shows fusiform dilatation and measures 17 mm. Aneurysmal disease of the distal SFA and popliteal artery present. At the juncture of the SFA and popliteal artery near the adductor hiatus, aneurysmal disease measures 4.8 x 5.3 cm. Tortuous and aneurysmal popliteal artery continues to the knee joint. Below the knee the popliteal artery shows no significant aneurysmal disease. Tibial evaluation is limited with patency of the peroneal artery visible into the  foot. The posterior and anterior tibial arteries likely are occluded in the mid to distal calf. There is stable chronic bilateral hydronephrosis and hydroureter with diffuse bladder wall thickening present. Stable bilateral adrenal nodularity. No evidence of bowel obstruction or inflammation. No free air, free fluid or abscess identified in the abdomen or pelvis. No lymphadenopathy. No hernias are seen. Bony structures demonstrate moderate spondylosis of the lumbar spine. Review of the MIP images confirms the above findings. IMPRESSION: 1. Significant enlargement of right common femoral artery pseudoaneurysm now measuring nearly 12 cm in maximum  height and 9 cm in maximum transverse diameter. There would be concern of potential impending rupture based on progressive pain and significant enlargement since the prior CTA. Vascular surgical consultation is recommended. 2. Interval enlargement of the aneurysm sac surrounding the endograft after prior EVAR. The aneurysm sac now measures 6.1 cm in greatest diameter compared to 5.4 cm on the prior study. A prominent type 2 endoleak is again noted, most likely supplied by the inferior mesenteric artery with at least one opacified left lumbar artery noted which may be acting as outflow. 3. 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 Tortuous aneurysmal disease of the right popliteal artery measures 4.3 cm in diameter and maximal diameter of aneurysmal disease on the left at the SFA/popliteal junction is 5.3 cm. Tibial evaluation is limited, likely reflecting slow flow. The right anterior tibial artery is likely occluded. The left posterior and anterior tibial arteries are likely occluded. Electronically Signed   By: Aletta Edouard M.D.   On: 01/23/2016 15:48   [place] Extrem Low Right Ltd  01/23/2016  CLINICAL DATA:  Patient presents with a right groin mass with associated pain. Patient with a dialysis patient scheduled for dialysis today. History of right iliac aneurysm. Patient has an aortoiliac stent graft. EXAM: ULTRASOUND RIGHT LOWER EXTREMITY LIMITED TECHNIQUE: Ultrasound examination of the lower extremity soft tissues was performed in the area of clinical concern. COMPARISON:  None FINDINGS: There is a heterogeneous mass with a cystic component in the right inguinal region measuring 8.5 x 6.7 x 6.8 cm. This cystic component measures approximately 8.5 x 5.0 x 5.2 cm. On color Doppler analysis, there is prominent, to and fro blood flow throughout the cystic component. No other abnormalities. IMPRESSION: 1. Findings consistent with a right inguinal canal  pseudoaneurysm. This was present on the prior CT. There is anterior thrombus with significant turbulent blood flow in the cystic component. Electronically Signed   By: Lajean Manes M.D.   On: 01/23/2016 11:06   Dg Chest Port 1 View  01/24/2016  CLINICAL DATA:  Bedside central venous catheter placement. EXAM: PORTABLE CHEST 1 VIEW 5:19 p.m.: COMPARISON:  Portable chest x-ray earlier today 5:08 a.m. and previously. FINDINGS: Right jugular central venous catheter tip projects over the lower SVC at or near the cavoatrial junction. No evidence of pneumothorax mediastinal hematoma. Cardiac silhouette moderately enlarged, unchanged. Pulmonary venous hypertension and mild interstitial pulmonary edema, unchanged. Bilateral pleural effusions, unchanged. Atelectasis involving the lower lobes, left greater than right, unchanged. No new pulmonary parenchymal abnormality. IMPRESSION: 1. Right jugular central venous catheter tip projects over the lower SVC at or near the cavoatrial junction. No acute complicating features. 2. Stable mild CHF, small bilateral pleural effusions and bilateral lower lobe atelectasis, left greater than right, since earlier today. No new abnormalities. Electronically Signed   By: Evangeline Dakin M.D.   On: 01/24/2016 17:50   Dg Chest Port 1 View  01/24/2016  CLINICAL DATA:  Shortness of breath. EXAM: PORTABLE CHEST 1 VIEW COMPARISON:  01/23/2016 FINDINGS: Cardiac enlargement with mild vascular congestion. Slight interstitial pattern in the lung bases likely indicating edema. Small bilateral pleural effusions and basilar atelectasis. Similar appearance to previous study. IMPRESSION: Cardiac enlargement with mild vascular congestion and mild interstitial edema. Small bilateral pleural effusions with basilar atelectasis. Electronically Signed   By: Lucienne Capers M.D.   On: 01/24/2016 05:32     Medications:   . sodium chloride 75 mL/hr at 01/24/16 1844  . DOPamine    . nitroGLYCERIN     .  amiodarone  200 mg Oral Daily  . amLODipine  2.5 mg Oral Daily  . atorvastatin  40 mg Oral QHS  . clindamycin (CLEOCIN) IV  300 mg Intravenous Q8H  . docusate sodium  100 mg Oral Daily  . famotidine (PEPCID) IV  20 mg Intravenous Q12H  . ferrous sulfate  325 mg Oral Daily  . guaiFENesin  600 mg Oral BID  . heparin  5,000 Units Subcutaneous Q8H  . magnesium oxide  400 mg Oral QPM  . mometasone-formoterol  2 puff Inhalation BID  . sevelamer carbonate  800 mg Oral TID AC  . tiotropium  18 mcg Inhalation Daily   sodium chloride, acetaminophen **OR** acetaminophen, albuterol, alum & mag hydroxide-simeth, guaiFENesin-dextromethorphan, hydrALAZINE, labetalol, loperamide, magnesium sulfate 1 - 4 g bolus IVPB, metoprolol, morphine injection, ondansetron, ondansetron, oxyCODONE, phenol, polyethylene glycol, potassium chloride, zolpidem  Assessment/ Plan:  75 y.o. 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  ESRD on HD TTHS: Patient completed hemodialysis yesterday as he missed on Tuesday. Therefore we will plan for another session of hemodialysis today. Ultrafiltration target 2 kg.  2.  Anemia of CKD:  Hemoglobin 9.0 at last check. We will start the patient on Epogen 10,000 units IV with dialysis.   3.  SHPTH:  Phosphorus 4.3 and PTH 198. Both are acceptable. Continue Renvela.  4.  HTN:  Continue amlodipine.   5.  AAA with type I endoleak and right common femoral aneurysm:  Patient with endovascular repair of abdominal aortic aneurysm as well as stent placement in the right common femoral  artery.   LOS: 2 Cherity Blickenstaff 5/4/20178:29 AM

## 2016-01-25 NOTE — Progress Notes (Signed)
[facility]  Daily Progress Note  Subjective: 1 Day Post-Op: US guidance for vascular access left femoral artery, Open cutdown right superficial femoral artery, catheter placement into aorta from bilateral femoral approaches, Placement of a 36 x 4.5 Gore Excluder aortic cuff Endoprosthesis for treatment of a suprarenal aortic aneurysm, Extension of suprarenal aortic aneurysm repair using a 36 x 4.5 Gore Excluder aortic cuff endoprosthesis, Placement of a 14 x 18 x 10 cm Gore covered stent left external iliac artery for treatment of external iliac artery stenosis and occlusion, Placement of a 14 x 14 x 14 cm Gore covered stent right external iliac artery for treatment of external iliac artery stenosis combined with aneurysmal disease, Placement of a 14 x 12 x 16 in combination with a 16 x 14 x 14 Gore covered stents right common femoral and right superficial femoral arteries for treatment of an 8 cm common femoral artery aneurysm with ProGlide closure devices left femoral artery.  Patient without complaint this AM. Had some back pain last night which improved with some morphine.   Objective: Filed Vitals:   01/25/16 0600 01/25/16 0700 01/25/16 0800 01/25/16 0900  BP: 96/58 96/75 102/60 108/60  Pulse: 74 75 73 74  Temp:  97.6 F (36.4 C)    TempSrc:  Oral    Resp: 19 24 20 19   Height:      Weight:      SpO2: 95% 99% 99% 99%    Intake/Output Summary (Last 24 hours) at 01/25/16 1003 Last data filed at 01/25/16 0939  Gross per 24 hour  Intake   2570 ml  Output   3400 ml  Net   -830 ml    Physical Exam: A&Ox3, NAD Neck: Central line intact, clean and dry CV: irregular Pulmonary: CTA Bilaterally Abdomen: Soft, Nontender, Non-distended Groin: Clean and dry, no drainage noted.  Vascular: Bilateral lower extremities warm, non-tender   Laboratory: CBC    Component Value Date/Time   WBC 7.4 01/24/2016 1058   WBC 11.7* 01/20/2015 0430   HGB 9.0* 01/24/2016 1058    HGB 6.9* 01/20/2015 0430   HCT 28.6* 01/24/2016 1058   HCT 20.0* 06/08/2015 1300   HCT 21.5* 01/20/2015 0430   PLT 210 01/24/2016 1058   PLT 176 01/20/2015 0430   BMET    Component Value Date/Time   NA 140 01/24/2016 1058   NA 141 01/20/2015 0430   K 4.0 01/24/2016 1058   K 3.6 01/20/2015 0430   CL 98* 01/24/2016 1058   CL 99* 01/20/2015 0430   CO2 30 01/24/2016 1058   CO2 30 01/20/2015 0430   GLUCOSE 92 01/24/2016 1058   GLUCOSE 119* 01/20/2015 0430   BUN 31* 01/24/2016 1058   BUN 32* 01/20/2015 0430   CREATININE 2.05* 01/24/2016 1058   CREATININE 6.73* 01/20/2015 0430   CALCIUM 8.4* 01/24/2016 1058   CALCIUM 7.8* 01/20/2015 0430   GFRNONAA 30* 01/24/2016 1058   GFRNONAA 7* 01/20/2015 0430   GFRNONAA 14* 08/17/2014 1228   GFRAA 35* 01/24/2016 1058   GFRAA 9* 01/20/2015 0430   GFRAA 17* 08/17/2014 1228   Assessment/Planning: 75 year old male s/p AAA repair - stable 1) Dialysis today. 2) Plan is to dispo back to [employer].   Marcelle Overlie PA-C 01/25/2016 10:03 AM

## 2016-01-25 NOTE — Consult Note (Addendum)
Progress note   PATIENT NAME: [name]    MR#:  [id number]  DATE OF BIRTH:  1941-02-20  CHIEF COMPLAINT:   Chief Complaint  Patient presents with  . Groin Pain   HPI 75 yo AAM seen in [place] for post op care, patient s/p vasc surgery repair for suprarenal AAA PROCEDURE: 1. US guidance for vascular access left femoral artery 2. Open cutdown right superficial femoral artery 3. Catheter placement into aorta from bilateral femoral approaches 4. Placement of a 36 x 4.5 Gore Excluder aortic cuff Endoprosthesis for treatment of a suprarenal aortic aneurysm 5. Extension of suprarenal aortic aneurysm repair using a 36 x 4.5 Gore Excluder aortic cuff endoprosthesis 6. Placement of a 14 x 18 x 10 cm Gore covered stent left external iliac artery for treatment of external iliac artery stenosis and occlusion 7. 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 Placement of a 14 x 12 x 16 in combination with a 16 x 14 x 14 Gore covered stents right common femoral and right superficial femoral arteries for treatment of an 8 cm common femoral artery aneurysm 9. ProGlide closure devices left femoral artery  Case discussed with vasc surgeon Patient alert and awake, follows commands No chest pain, lower abd pain  REVIEW OF SYSTEMS:   Review of Systems  Constitutional: Negative for fever, chills, weight loss and malaise/fatigue.  HENT: Negative for congestion.   Eyes: Negative for blurred vision.  Respiratory: Negative for cough and shortness of breath.   Cardiovascular: Negative for chest pain and palpitations.  Genitourinary: Negative for dysuria.  Musculoskeletal: Negative for myalgias.  Skin: Negative for rash.  Neurological: Negative for dizziness and headaches.  Endo/Heme/Allergies: Does not bruise/bleed easily.   Physical Exam  Constitutional: He is oriented to person,  place, and time. No distress.  HENT:  Head: Normocephalic and atraumatic.  Eyes: Pupils are equal, round, and reactive to light.  Neck: Neck supple.  Cardiovascular: Normal rate and regular rhythm.   No murmur heard. Pulmonary/Chest: Effort normal and breath sounds normal. No respiratory distress. He has no wheezes.  Abdominal: Soft.  Musculoskeletal: Normal range of motion.  Neurological: He is alert and oriented to person, place, and time. No cranial nerve deficit.  Skin: Skin is warm. He is not diaphoretic. No erythema.       DRUG ALLERGIES:   Allergies  Allergen Reactions  . Penicillins Hives, Itching and Swelling    Has patient had a PCN reaction causing immediate rash, facial/tongue/throat swelling, SOB or lightheadedness with hypotension: Yes Has patient had a PCN reaction causing severe rash involving mucus membranes or skin necrosis: No Has patient had a PCN reaction that required hospitalization No Has patient had a PCN reaction occurring within the last 10 years: No If all of the above answers are "NO", then may proceed with Cephalosporin use.    VITALS:  Blood pressure 96/75, pulse 75, temperature 97.6 F (36.4 C), temperature source Oral, resp. rate 24, height 6\' 2"  (1.88 m), weight 187 lb 9.6 oz (85.095 kg), SpO2 99 %.     LABORATORY PANEL:   CBC  Recent Labs Lab 01/24/16 1058  WBC 7.4  HGB 9.0*  HCT 28.6*  PLT 210   ------------------------------------------------------------------------------------------------------------------  Chemistries   Recent Labs Lab 01/24/16 1058  NA 140  K 4.0  CL 98*  CO2 30  GLUCOSE 92  BUN 31*  CREATININE 2.05*  CALCIUM 8.4*   ------------------------------------------------------------------------------------------------------------------  Cardiac Enzymes  Recent Labs Lab 01/25/16 0504  TROPONINI 0.03    ------------------------------------------------------------------------------------------------------------------  RADIOLOGY:  Dg Chest 1 View  01/23/2016  CLINICAL DATA:  Cough intermittently for several months EXAM: CHEST 1 VIEW COMPARISON:  11/23/2015 FINDINGS: There are trace bilateral pleural effusions. There is bilateral mild interstitial thickening. There is no pneumothorax. There is mild bibasilar atelectasis. There is stable cardiomegaly. The osseous structures are unremarkable. IMPRESSION: Mild CHF. 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 EXAM: CT ANGIOGRAPHY OF ABDOMINAL AORTA WITH ILIOFEMORAL RUNOFF TECHNIQUE: Multidetector CT imaging of the abdomen, pelvis and lower extremities was performed using the standard protocol during bolus administration of intravenous contrast. Multiplanar CT image reconstructions and MIPs were obtained to evaluate the vascular anatomy. CONTRAST:  100 mL Isovue 370 IV COMPARISON:  Prior CTA of the abdomen and pelvis on 06/09/2015 as well as right groin ultrasound earlier today. FINDINGS: Aorta: The aortic sac surrounding the endograft shows interval enlargement with current maximal dimensions of approximately 5.3 x 6.1 cm compared to maximal diameter on prior study of 5.4 cm. Prominent anterior endoleak again noted extending to the left side of the aortic sac. This again most likely represents an endoleak supplied by the inferior mesenteric artery. There is at least 1 opacified lumbar artery posteriorly to the left of midline at roughly the L4 level. This may represent additional inflow or potentially outflow. There is no evidence of type 1 endoleak. Distal endograft limbs appear normally patent and stable in position.  Stable mild stenosis at the origin of the celiac axis. Stable fusiform dilatation of the SMA trunk. Stable fusiform dilatation of the proximal splenic artery and the common hepatic artery. Right Lower Extremity: Right iliac arteries are tortuous and normally patent. The internal iliac artery is occluded by embolization coils. There is an enlarging aneurysm of the right common femoral artery that also involves the proximal SFA. Based on appearance this is most likely an elongated pseudoaneurysm. Maximal dimensions are currently 9.1 x 6.3 cm transversely and 11.9 cm in maximum height. This represents significant enlargement since prior CTA at which time maximal aneurysm dimensions were approximately 5.9 cm. The right SFA shows diffuse fusiform dilatation calcified plaque with maximum diameter of approximately 14 mm. 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 Just beyond the adductor hiatus and above the knee joint, the popliteal aneurysm measures approximately 4.3 x 4.3 cm. The popliteal artery is tortuous and becomes normal in caliber just below the knee joint. Tibial artery opacification is suboptimal. The posterior tibial artery appears continuously patent into the foot. The peroneal artery also demonstrates patency into the foot. The anterior tibial artery is likely occluded at the mid calf level. Left Lower Extremity: Left native iliac arteries are normally patent. The internal iliac artery is occluded by embolization coils. There is fusiform dilatation of the common femoral artery measuring up to 16 mm in diameter. The SFA also shows fusiform dilatation and measures 17 mm. Aneurysmal disease of the distal SFA and popliteal artery present. At the juncture of the SFA and popliteal artery near the adductor hiatus, aneurysmal disease measures 4.8 x 5.3 cm. Tortuous and aneurysmal popliteal artery continues to the knee joint. Below the knee the  popliteal artery shows no significant aneurysmal  disease. Tibial evaluation is limited with patency of the peroneal artery visible into the foot. The posterior and anterior tibial arteries likely are occluded in the mid to distal calf. There is stable chronic bilateral hydronephrosis and hydroureter with diffuse bladder wall thickening present. Stable bilateral adrenal nodularity. No evidence of bowel obstruction or inflammation. No free air, free fluid or abscess identified in the abdomen or pelvis. No lymphadenopathy. No hernias are seen. Bony structures demonstrate moderate spondylosis of the lumbar spine. Review of the MIP images confirms the above findings. IMPRESSION: 1. 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 There would be concern of potential impending rupture based on progressive pain and significant enlargement since the prior CTA. Vascular surgical consultation is recommended. 2. Interval enlargement of the aneurysm sac surrounding the endograft after prior EVAR. The aneurysm sac now measures 6.1 cm in greatest diameter compared to 5.4 cm on the prior study. A prominent type 2 endoleak is again noted, most likely supplied by the inferior mesenteric artery with at least one opacified left lumbar artery noted which may be acting as outflow. 3. 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 Tortuous aneurysmal disease of the right popliteal artery measures 4.3 cm in diameter and maximal diameter of aneurysmal disease on the left at the SFA/popliteal junction is 5.3 cm. Tibial evaluation is limited, likely reflecting slow flow. The right anterior tibial artery is likely occluded. The left posterior and anterior tibial arteries are likely occluded. Electronically Signed   By: Aletta Edouard M.D.   On: 01/23/2016 15:48   [place] Extrem Low Right Ltd  01/23/2016  CLINICAL DATA:  Patient presents with  a right groin mass with associated pain. Patient with a dialysis patient scheduled for dialysis today. History of right iliac aneurysm. Patient has an aortoiliac stent graft. EXAM: ULTRASOUND RIGHT LOWER EXTREMITY LIMITED TECHNIQUE: Ultrasound examination of the lower extremity soft tissues was performed in the area of clinical concern. COMPARISON:  None FINDINGS: There is a heterogeneous mass with a cystic component in the right inguinal region measuring 8.5 x 6.7 x 6.8 cm. This cystic component measures approximately 8.5 x 5.0 x 5.2 cm. On color Doppler analysis, there is prominent, to and fro blood flow throughout the cystic component. No other abnormalities. IMPRESSION: 1. 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 This was present on the prior CT. There is anterior thrombus with significant turbulent blood flow in the cystic component. Electronically Signed   By: Lajean Manes M.D.   On: 01/23/2016 11:06   Dg Chest Port 1 View  01/24/2016  CLINICAL DATA:  Bedside central venous catheter placement. EXAM: PORTABLE CHEST 1 VIEW 5:19 p.m.: COMPARISON:  Portable chest x-ray earlier today 5:08 a.m. and previously. FINDINGS: Right jugular central venous catheter tip projects over the lower SVC at or near the cavoatrial junction. No evidence of pneumothorax mediastinal hematoma. Cardiac silhouette moderately enlarged, unchanged. Pulmonary venous hypertension and mild interstitial pulmonary edema, unchanged. Bilateral pleural effusions, unchanged. Atelectasis involving the lower lobes, left greater than right, unchanged. No new pulmonary parenchymal abnormality. IMPRESSION: 1. Right jugular central venous catheter tip projects over the lower SVC at or near the cavoatrial junction. No acute complicating features. 2. Stable mild CHF, small bilateral pleural effusions and bilateral lower lobe atelectasis, left greater than right, since earlier today. No new abnormalities. Electronically Signed  By:  Evangeline Dakin M.D.   On: 01/24/2016 17:50   Dg Chest Port 1 View  01/24/2016  CLINICAL DATA:  Shortness of breath. EXAM: PORTABLE CHEST 1 VIEW COMPARISON:  01/23/2016 FINDINGS: Cardiac enlargement with mild vascular congestion. Slight interstitial pattern in the lung bases likely indicating edema. Small bilateral pleural effusions and basilar atelectasis. Similar appearance to previous study. IMPRESSION: Cardiac enlargement with mild vascular congestion and mild interstitial edema. Small bilateral pleural effusions with basilar atelectasis. Electronically Signed   By: Lucienne Capers M.D.   On: 01/24/2016 05:32    EKG:   Orders placed or performed during the hospital encounter of 01/23/16  . EKG 12-Lead  . EKG 12-Lead  . EKG 12-Lead  . EKG 12-Lead    ASSESSMENT AND PLAN:  75 yo AAM with  symptomatic common femoral artery aneurysm  seen by vascular and taken to surgery today. H/o  secondary to cardiac arrhythmia, ESRD, COPD.   1.optimze cardiac meds 2.optimize and check home inhaler regimen, continue oxygen as needed 3.follow up vasc surgery recs 4.ESRD on HD-hd as needed 5.continue step down status 6.DVT prx     The Patient requires high complexity decision making for assessment and support, frequent evaluation and titration of therapies.  Patient  satisfied with Plan of action and management. All questions answered  Corrin Parker, M.D.  [facility]  Medical Director [place] Director [facility] Cardio-Pulmonary Department

## 2016-01-25 NOTE — Clinical Social Work Note (Signed)
Clinical Social Work Assessment  Patient Details  Name: [name] MRN: [id number] Date of Birth: 1941-03-10  Date of referral:  01/25/16               Reason for consult:  Facility Placement                Permission sought to share information with:  Facility Sport and exercise psychologist, Family Supports Permission granted to share information::  Yes, Verbal Permission Granted  Name::        Agency::     Relationship::     Contact Information:     Housing/Transportation Living arrangements for the past 2 months:  [place] of Information:  Other (Comment Required) (relative) Patient Interpreter Needed:  None Criminal Activity/Legal Involvement Pertinent to Current Situation/Hospitalization:  No - Comment as needed Significant Relationships:  Other Family Members (cousin) Lives with:  Facility Resident Do you feel safe going back to the place where you live?  Yes Need for family participation in patient care:  Yes (Comment)  Care giving concerns:  Patient resides at [facility].   Social Worker assessment / plan:  CSW made aware today that the MD is going to plan to discharge back to [facility]. Patient is known to CSW from previous admissions. Patient's relative is his cousin: [name] at [phone]. Ms. [name] is a very pleasant lady who states that patient is a long term resident at [facility] and wishes for patient to return there. Ms. [name] stated that she would like for him to be transported by EMS when time. CSW has updated [employer] at [facility] and patient can return.  Employment status:  Retired Nurse, adult PT Recommendations:  Not assessed at this time Information / Referral to community resources:     Patient/Family's Response to care:  Patient's cousin expressed appreciation for CSW assistance.  Patient/Family's Understanding of and Emotional Response to Diagnosis, Current  Treatment, and Prognosis:  Ms. 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  Emotional Assessment Appearance:  Appears stated age Attitude/Demeanor/Rapport:  Unable to Assess Affect (typically observed):  Unable to Assess Orientation:  Oriented to Self Alcohol / Substance use:  Not Applicable Psych involvement (Current and /or in the community):  No (Comment)  Discharge Needs  Concerns to be addressed:  Care Coordination Readmission within the last 30 days:  No Current discharge risk:  None Barriers to Discharge:  No Barriers Identified   [name], [place] 01/25/2016, 2:52 PM

## 2016-01-25 NOTE — Progress Notes (Signed)
Chaplain rounded the unit and provided a compassionate presence and spiritual support to the patient.  Chaplain Chosen Garron [phone] 

## 2016-01-25 NOTE — Progress Notes (Signed)
Post dialysis assessment 

## 2016-01-25 NOTE — Progress Notes (Signed)
Dialysis treatment start

## 2016-01-25 NOTE — Progress Notes (Signed)
Treatment end assessment 

## 2016-01-25 NOTE — Progress Notes (Signed)
Pre dialysis assessment 

## 2016-01-25 NOTE — Consult Note (Signed)
[facility] NAME: [name]    MR#:  [id number]  DATE OF BIRTH:  [birth year]  SUBJECTIVE: Patient is seen in ICU. He denies shortness of breath or chest pain. Getting hemodialysis.   CHIEF COMPLAINT:   Chief Complaint  Patient presents with  . Groin Pain    REVIEW OF SYSTEMS:   Review of Systems  Constitutional: Negative for fever, chills and weight loss.  HENT: Negative for hearing loss.   Eyes: Negative for blurred vision, double vision and photophobia.  Respiratory: Negative for cough, hemoptysis and shortness of breath.   Cardiovascular: Negative for chest pain, palpitations, orthopnea and leg swelling.  Gastrointestinal: Negative for vomiting, abdominal pain and diarrhea.  Genitourinary: Negative for dysuria and urgency.       Penile discharge  Musculoskeletal: Negative for myalgias and neck pain.  Skin: Negative for rash.  Neurological: Negative for dizziness, focal weakness, seizures, weakness and headaches.  Endo/Heme/Allergies: Does not bruise/bleed easily.  Psychiatric/Behavioral: Negative for memory loss. The patient does not have insomnia.      DRUG ALLERGIES:   Allergies  Allergen Reactions  . Penicillins Hives, Itching and Swelling    Has patient had a PCN reaction causing immediate rash, facial/tongue/throat swelling, SOB or lightheadedness with hypotension: Yes Has patient had a PCN reaction causing severe rash involving mucus membranes or skin necrosis: No Has patient had a PCN reaction that required hospitalization No Has patient had a PCN reaction occurring within the last 10 years: No If all of the above answers are "NO", then may proceed with Cephalosporin use.    VITALS:  Blood pressure 104/71, pulse 88, temperature 97.5 F (36.4 C), temperature source Oral, resp. rate 29, height 6\' 2"  (1.88 m), weight 85.095 kg (187 lb 9.6 oz), SpO2 100 %.  PHYSICAL EXAMINATION:  GENERAL:  75  y.o.-year-old patient lying in the bed with no acute distress. ,markedly debilitated EYES: Pupils equal, round, reactive to light and accommodation. No scleral icterus. Extraocular muscles intact.  HEENT: Head atraumatic, normocephalic. Oropharynx and nasopharynx clear.  and is poor.  NECK:  Supple, no jugular venous distention. No thyroid enlargement, no tenderness.  LUNGS: Normal breath sounds bilaterally, no wheezing, rales,rhonchi or crepitation. No use of accessory muscles of respiration.  CARDIOVASCULAR: S1, S2 normal. No murmurs, rubs, or gallops.  ABDOMEN: Soft, nontender, nondistended. Bowel sounds present. No organomegaly or mass.  EXTREMITIES:  large l tender pulsatile mass present in the right groin.  NEUROLOGIC: Cranial nerves II through XII are intact. Muscle strength 5/5 in all extremities. Sensation intact. Gait not checked.  PSYCHIATRIC: The patient is alert and oriented x 3.  SKIN: No obvious rash, lesion, or ulcer.    LABORATORY PANEL:   CBC  Recent Labs Lab 01/24/16 1058  WBC 7.4  HGB 9.0*  HCT 28.6*  PLT 210   ------------------------------------------------------------------------------------------------------------------  Chemistries   Recent Labs Lab 01/24/16 1058  NA 140  K 4.0  CL 98*  CO2 30  GLUCOSE 92  BUN 31*  CREATININE 2.05*  CALCIUM 8.4*   ------------------------------------------------------------------------------------------------------------------  Cardiac Enzymes  Recent Labs Lab 01/25/16 1059  TROPONINI 0.04*   ------------------------------------------------------------------------------------------------------------------  RADIOLOGY:  Dg Chest 1 View  01/23/2016  CLINICAL DATA:  Cough intermittently for several months EXAM: CHEST 1 VIEW COMPARISON:  11/23/2015 FINDINGS: There are trace bilateral pleural effusions. There is bilateral mild interstitial thickening. There is no pneumothorax. There is mild bibasilar  atelectasis. There is stable cardiomegaly. The osseous  structures are unremarkable. IMPRESSION: Mild CHF. 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 EXAM: CT ANGIOGRAPHY OF ABDOMINAL AORTA WITH ILIOFEMORAL RUNOFF TECHNIQUE: Multidetector CT imaging of the abdomen, pelvis and lower extremities was performed using the standard protocol during bolus administration of intravenous contrast. Multiplanar CT image reconstructions and MIPs were obtained to evaluate the vascular anatomy. CONTRAST:  100 mL Isovue 370 IV COMPARISON:  Prior CTA of the abdomen and pelvis on 06/09/2015 as well as right groin ultrasound earlier today. FINDINGS: Aorta: The aortic sac surrounding the endograft shows interval enlargement with current maximal dimensions of approximately 5.3 x 6.1 cm compared to maximal diameter on prior study of 5.4 cm. Prominent anterior endoleak again noted extending to the left side of the aortic sac. This again most likely represents an endoleak supplied by the inferior mesenteric artery. There is at least 1 opacified lumbar artery posteriorly to the left of midline at roughly the L4 level. This may represent additional inflow or potentially outflow. There is no evidence of type 1 endoleak. Distal endograft limbs appear normally patent and stable in position. Stable mild stenosis at the origin of the celiac axis. Stable fusiform dilatation of the SMA trunk. Stable fusiform dilatation of the proximal splenic artery and the common hepatic artery. Right Lower Extremity: Right iliac arteries are tortuous and normally patent. The internal iliac artery is occluded by embolization coils. There is an enlarging aneurysm of the right common femoral artery that also involves the  proximal SFA. Based on appearance this is most likely an elongated pseudoaneurysm. Maximal dimensions are currently 9.1 x 6.3 cm transversely and 11.9 cm in maximum height. This represents significant enlargement since prior CTA at which time maximal aneurysm dimensions were approximately 5.9 cm. The right SFA shows diffuse fusiform dilatation calcified plaque with maximum diameter of approximately 14 mm. 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 Just beyond the adductor hiatus and above the knee joint, the popliteal aneurysm measures approximately 4.3 x 4.3 cm. The popliteal artery is tortuous and becomes normal in caliber just below the knee joint. Tibial artery opacification is suboptimal. The posterior tibial artery appears continuously patent into the foot. The peroneal artery also demonstrates patency into the foot. The anterior tibial artery is likely occluded at the mid calf level. Left Lower Extremity: Left native iliac arteries are normally patent. The internal iliac artery is occluded by embolization coils. There is fusiform dilatation of the common femoral artery measuring up to 16 mm in diameter. The SFA also shows fusiform dilatation and measures 17 mm. Aneurysmal disease of the distal SFA and popliteal artery present. At the juncture of the SFA and popliteal artery near the adductor hiatus, aneurysmal disease measures 4.8 x 5.3 cm. Tortuous and aneurysmal popliteal artery continues to the knee joint. Below the knee the popliteal artery shows no significant aneurysmal disease. Tibial evaluation is limited with patency of the peroneal artery visible into the foot. The posterior and anterior tibial arteries likely are occluded in the mid to distal calf. There is stable chronic bilateral hydronephrosis and hydroureter with diffuse bladder wall thickening present. Stable bilateral adrenal nodularity. No evidence of bowel obstruction or inflammation. No free air, free fluid or abscess  identified in the abdomen or pelvis. No lymphadenopathy.  No hernias are seen. Bony structures demonstrate moderate spondylosis of the lumbar spine. Review of the MIP images confirms the above findings. IMPRESSION: 1. 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 There would be concern of potential impending rupture based on progressive pain and significant enlargement since the prior CTA. Vascular surgical consultation is recommended. 2. Interval enlargement of the aneurysm sac surrounding the endograft after prior EVAR. The aneurysm sac now measures 6.1 cm in greatest diameter compared to 5.4 cm on the prior study. A prominent type 2 endoleak is again noted, most likely supplied by the inferior mesenteric artery with at least one opacified left lumbar artery noted which may be acting as outflow. 3. 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 Tortuous aneurysmal disease of the right popliteal artery measures 4.3 cm in diameter and maximal diameter of aneurysmal disease on the left at the SFA/popliteal junction is 5.3 cm. Tibial evaluation is limited, likely reflecting slow flow. The right anterior tibial artery is likely occluded. The left posterior and anterior tibial arteries are likely occluded. Electronically Signed   By: Aletta Edouard M.D.   On: 01/23/2016 15:48   Dg Chest Port 1 View  01/24/2016  CLINICAL DATA:  Bedside central venous catheter placement. EXAM: PORTABLE CHEST 1 VIEW 5:19 p.m.: COMPARISON:  Portable chest x-ray earlier today 5:08 a.m. and previously. FINDINGS: Right jugular central venous catheter tip projects over the lower SVC at or near the cavoatrial junction. No evidence of pneumothorax mediastinal hematoma. Cardiac silhouette moderately enlarged, unchanged. Pulmonary venous hypertension and mild interstitial pulmonary edema,  unchanged. Bilateral pleural effusions, unchanged. Atelectasis involving the lower lobes, left greater than right, unchanged. No new pulmonary parenchymal abnormality. IMPRESSION: 1. Right jugular central venous catheter tip projects over the lower SVC at or near the cavoatrial junction. No acute complicating features. 2. Stable mild CHF, small bilateral pleural effusions and bilateral lower lobe atelectasis, left greater than right, since earlier today. No new abnormalities. Electronically Signed   By: Evangeline Dakin M.D.   On: 01/24/2016 17:50   Dg Chest Port 1 View  01/24/2016  CLINICAL DATA:  Shortness of breath. EXAM: PORTABLE CHEST 1 VIEW COMPARISON:  01/23/2016 FINDINGS: Cardiac enlargement with mild vascular congestion. Slight interstitial pattern in the lung bases likely indicating edema. Small bilateral pleural effusions and basilar atelectasis. Similar appearance to previous study. IMPRESSION: Cardiac enlargement with mild vascular congestion and mild interstitial edema. Small bilateral pleural effusions with basilar atelectasis. Electronically Signed   By: Lucienne Capers M.D.   On: 01/24/2016 05:32    EKG:   Orders placed or performed during the hospital encounter of 01/23/16  . EKG 12-Lead  . EKG 12-Lead  . EKG 12-Lead  . 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 Patient tolerated the procedure well. Seen in ICU for postop care. Getting hemodialysis. Continue clindamycin  #2 milky penile discharge. Seen  is seen by urology. This is likely due to debris  Collected  in the bladder of a patient who does not make urine, no intervention is indicated as per neurology recommendation.  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  #4 paroxysmal atrial fibrillation: Continue amiodarone. 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  #7 bilateral hydronephrosis, history of bladder cancer, history of metastatic prostate cancer:    Reviewed med records,meds,noted from vascular,HD note,spoke to HD nurse  All the records are reviewed and case discussed with Care Management/Social Workerr. Management plans discussed with the patient, family and they are in agreement.  CODE STATUS: full  TOTAL TIME TAKING CARE OF THIS PATIENT: 25  minutes.   POSSIBLE D/C IN 7-8 DAYS, DEPENDING ON CLINICAL CONDITION.   Epifanio Lesches M.D on 01/25/2016 at 1:54 PM  Between 7am to 6pm - Pager - [phone]  After 6pm go to [email or web address] - password [email or web address] [facility]  Office  [phone]  CC: Primary care physician; Dion Body, MD   Note: This dictation was prepared with Dragon dictation along with smaller phrase technology. Any transcriptional errors that result from this process are unintentional.

## 2016-01-25 NOTE — NC FL2 (Signed)
[place] LEVEL OF CARE SCREENING TOOL     IDENTIFICATION  Patient Name: [name] Birthdate: [birth year] Sex: male Admission Date (Current Location): 01/23/2016  [place] and [place] Number:  Engineering geologist and Address:  [facility], [address], [place], [place] [address]      Provider Number: [id number]  Attending Physician Name and Address:  Katha Cabal, MD  Relative Name and Phone Number:       Current Level of Care: Hospital Recommended Level of Care: [place] Prior Approval Number:    Date Approved/Denied:   PASRR Number:    Discharge Plan: SNF    Current Diagnoses: Patient Active Problem List   Diagnosis Date Noted  . Suprarenal aortic aneurysm ([place]) 01/23/2016  . COPD (chronic obstructive pulmonary disease) with chronic bronchitis ([place]) 11/06/2015  . Chronic diastolic heart failure ([place]) 08/11/2015  . Fluid overload 07/23/2015  . Generalized weakness 05/08/2015  . Other emphysema ([place]) 05/08/2015  . Tobacco use disorder 05/08/2015  . Hypokalemia 05/08/2015  . Pressure ulcer 05/05/2015  . Symptomatic anemia 05/04/2015  . CAFL (chronic airflow limitation) ([place]) 04/10/2015  . Malnutrition of moderate degree ([place]) 03/22/2015  . Pneumonia 03/20/2015  . History of sepsis 02/27/2015  . Incontinence 02/27/2015  . Persistent atrial fibrillation ([place]) 02/27/2015  . Preoperative cardiovascular examination 02/27/2015  . ESRD on hemodialysis ([place])   . Anemia   . History of small bowel obstruction   . Atrial fibrillation with RVR ([place]) 01/21/2015  . Chronic kidney disease (CKD), stage IV (severe) ([place]) 03/01/2014  . Essential (primary) hypertension 03/01/2014  . 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 Cystitis 11/11/2012  . Hydronephrosis 08/27/2012  . Bladder cancer ([place]) 08/27/2012  . CA of prostate ([place]) 08/27/2012  . Bone metastases ([place]) 08/27/2012    Orientation RESPIRATION BLADDER  Height & Weight     Self  Normal, O2 (3 liters) Incontinent Weight: 187 lb 9.6 oz (85.095 kg) Height:  6\' 2"  (188 cm)  BEHAVIORAL SYMPTOMS/MOOD NEUROLOGICAL BOWEL NUTRITION STATUS   (none)  (none) Incontinent Diet (renal with fluid restriction)  AMBULATORY STATUS COMMUNICATION OF NEEDS Skin   Extensive Assist Verbally Surgical wounds                       Personal Care Assistance Level of Assistance  Bathing, Dressing           Functional Limitations Info             SPECIAL CARE FACTORS FREQUENCY   (outpatient dialysis)                    Contractures Contractures Info: Not present    Additional Factors Info  Code Status, Isolation Precautions Code Status Info: full             Current Medications (01/25/2016):  This is the current hospital active medication list Current Facility-Administered Medications  Medication Dose Route Frequency Provider Last Rate Last Dose  . 0.9 %  sodium chloride infusion  500 mL Intravenous Once PRN Katha Cabal, MD      . acetaminophen (TYLENOL) tablet 325-650 mg  325-650 mg Oral Q4H PRN Katha Cabal, MD   650 mg at 01/24/16 1941   Or  . acetaminophen (TYLENOL) suppository 325-650 mg  325-650 mg Rectal Q4H PRN Katha Cabal, MD      . albuterol (PROVENTIL) (2.5 MG/3ML) 0.083% nebulizer solution 2.5 mg  2.5 mg  Nebulization Q4H PRN Fritzi Mandes, MD   2.5 mg at 01/25/16 1134  . alum & mag hydroxide-simeth (MAALOX/MYLANTA) 200-200-20 MG/5ML suspension 15-30 mL  15-30 mL Oral Q2H PRN Katha Cabal, MD      . amiodarone (PACERONE) tablet 200 mg  200 mg Oral Daily Katha Cabal, MD   200 mg at 01/25/16 0930  . amLODipine (NORVASC) tablet 2.5 mg  2.5 mg Oral Daily Fritzi Mandes, MD   2.5 mg at 01/23/16 2225  . atorvastatin (LIPITOR) tablet 40 mg  40 mg Oral QHS Fritzi Mandes, MD   40 mg at 01/24/16 2142  . clindamycin (CLEOCIN) IVPB 300 mg  300 mg Intravenous Q8H Katha Cabal, MD   300 mg at 01/25/16 1430  .  docusate sodium (COLACE) capsule 100 mg  100 mg Oral Daily Katha Cabal, MD      . DOPamine (INTROPIN) 800 mg in dextrose 5 % 250 mL (3.2 mg/mL) infusion  3-5 mcg/kg/min Intravenous Continuous Katha Cabal, MD   3 mcg/kg/min at 01/24/16 1837  . [START ON 01/27/2016] famotidine (PEPCID) IVPB 20 mg premix  20 mg Intravenous Q48H Flora Lipps, MD      . ferrous sulfate tablet 325 mg  325 mg Oral Daily Fritzi Mandes, MD   325 mg at 01/25/16 0931  . guaiFENesin (MUCINEX) 12 hr tablet 600 mg  600 mg Oral BID Katha Cabal, MD   600 mg at 01/25/16 0931  . guaiFENesin-dextromethorphan (ROBITUSSIN DM) 100-10 MG/5ML syrup 15 mL  15 mL Oral Q4H PRN Katha Cabal, MD      . heparin injection 5,000 Units  5,000 Units Subcutaneous Q8H Katha Cabal, MD   5,000 Units at 01/25/16 1430  . hydrALAZINE (APRESOLINE) injection 5 mg  5 mg Intravenous Q20 Min PRN Katha Cabal, MD      . labetalol (NORMODYNE,TRANDATE) injection 10 mg  10 mg Intravenous Q10 min PRN Katha Cabal, MD      . loperamide (IMODIUM) capsule 4 mg  4 mg Oral QID PRN Fritzi Mandes, MD      . magnesium oxide (MAG-OX) tablet 400 mg  400 mg Oral QPM Fritzi Mandes, MD   400 mg at 01/23/16 2224  . magnesium sulfate IVPB 2 g 50 mL  2 g Intravenous Daily PRN Katha Cabal, MD      . metoprolol (LOPRESSOR) injection 2-5 mg  2-5 mg Intravenous Q2H PRN Katha Cabal, MD      . mometasone-formoterol [facility]) 200-5 MCG/ACT inhaler 2 puff  2 puff Inhalation BID Fritzi Mandes, MD   2 puff at 01/25/16 0936  . morphine 2 MG/ML injection 2-5 mg  2-5 mg Intravenous Q1H PRN Katha Cabal, MD   2 mg at 01/25/16 0002  . nitroGLYCERIN 50 mg in dextrose 5 % 250 mL (0.2 mg/mL) infusion  5-250 mcg/min Intravenous Titrated Katha Cabal, MD   5 mcg/min at 01/24/16 1835  . ondansetron (ZOFRAN) injection 4 mg  4 mg Intravenous Q6H PRN Katha Cabal, MD      . ondansetron (ZOFRAN-ODT) disintegrating tablet 8 mg  8 mg Oral Q8H PRN Katha Cabal, MD   8 mg at 01/25/16 1339  . oxyCODONE (Oxy IR/ROXICODONE) immediate release tablet 5-10 mg  5-10 mg Oral Q4H PRN Katha Cabal, MD   10 mg at 01/25/16 1102  . phenol (CHLORASEPTIC) mouth spray 1 spray  1 spray Mouth/Throat PRN Katha Cabal, MD      .  polyethylene glycol (MIRALAX / GLYCOLAX) packet 17 g  17 g Oral Daily PRN Fritzi Mandes, MD      . potassium chloride SA (K-DUR,KLOR-CON) CR tablet 20-40 mEq  20-40 mEq Oral Daily PRN Katha Cabal, MD      . sevelamer carbonate (RENVELA) tablet 800 mg  800 mg Oral TID AC Fritzi Mandes, MD   800 mg at 01/25/16 0931  . tiotropium (SPIRIVA) inhalation capsule 18 mcg  18 mcg Inhalation Daily Fritzi Mandes, MD   18 mcg at 01/25/16 1000  . zolpidem (AMBIEN) tablet 5 mg  5 mg Oral QHS PRN Katha Cabal, MD         Discharge Medications: Please see discharge summary for a list of discharge medications.  Relevant Imaging Results:  Relevant Lab Results:   Additional Information    [name], LCSW

## 2016-01-25 NOTE — Consult Note (Addendum)
@ENCDATE @ 11:58 AM   [name] 05/01/1941 [id number]  Referring provider: Dr. Rosilyn Mings  Chief Complaint  Patient presents with  . Groin Pain    HPI: The patient is a 75 year old gentleman with a past medical history significant for metastatic prostate cancer, bladder cancer, end-stage renal disease on dialysis who was admitted to the hospital with an aortic aneurysm. Urology was consulted for copious drainage from his penis.  1. Urethral drainage The patient is end-stage renal disease on dialysis and is anuric. His nurse noted drainage from his penis while changing him. The patient has never seen drainage from his penis before. He denies dysuria or suprapubic discomfort.  2. 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 This is managed at the cancer center. Last lupron injection 10/2015. Last PSA 10/2015: 2.0 - stable  3. History of bladder cancer The patient had a TURBT in 2013 which noted a low-grade papillary urothelial carcinoma. He is overdue for surveillance cystoscopy.  4. Bilateral hydronephrosis The patient has history of bilateral hydronephrosis secondary to prostate cancer causing obstruction. He has a history of bilateral ureteral stents. These were removed last year by Dr. Erlene Quan as they were felt to be no longer indicated as the patient was anuric and on hemodialysis.     PMH: Past Medical History  Diagnosis Date  . PAF (paroxysmal atrial fibrillation) ([place])     a. new onset 12/2014 in the setting of UTI, sepsis, hypotension, and anemia; b. not on long term anticoagulation given anemia; c. family aware of stroke risk, they are ok with this; d. on amiodarone   . Chronic combined systolic and diastolic CHF (congestive heart failure) ([place])     a. echo 12/2014: EF 25-30%, anterior wall wall motion abnormalities; b. planned ischemic evaluation once patient is stable medically; c. echo 01/2015: EF 45-50%, no RWMA, mild AT, LA  mildly dilated, mod pericardial effusion along LV free wall, no evidence of hemodynamic compromise  . ESRD on hemodialysis ([place])     a. Tuesday, Thursday, and Saturdays  . Anemia     a. baseline hgb ~ 8  . History of small bowel obstruction     a. 01/2015  . History of septic shock     a. 01/2015  . GERD (gastroesophageal reflux disease)   . Allergic rhinitis   . COPD (chronic obstructive pulmonary disease) ([place])   . Hypercholesteremia   . Cognitive communication deficit   . Magnesium deficiency   . Dementia   . 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 Incontinence   . Hydronephrosis   . Overactive bladder   . Detrusor sphincter dyssynergia   . Mini stroke ([place])   . Bladder cancer ([place])   . Prostate cancer ([place])   . Renal insufficiency   . Hypertension     Surgical History: Past Surgical History  Procedure Laterality Date  . Cystoscopy w/ ureteral stent placement    . Transurethral resection of bladder tumor with gyrus (turbt-gyrus)    . Ureteral stent placement    . Prostate surgery      Removal  . Av fistula placement      left arm    Home Medications:    Medication List    ASK your doctor about these medications        acetaminophen 325 MG tablet  Commonly known as:  TYLENOL  Take 650 mg by mouth every 8 (eight) hours as needed. For general discomfort  albuterol (2.5 MG/3ML) 0.083% nebulizer solution  Commonly known as:  PROVENTIL  Take 3 mLs (2.5 mg total) by nebulization every 4 (four) hours as needed for wheezing or shortness of breath.     amiodarone 200 MG tablet  Commonly known as:  PACERONE  Take 1 tablet (200 mg total) by mouth daily.     amLODipine 2.5 MG tablet  Commonly known as:  NORVASC  Take 2.5 mg by mouth daily.     atorvastatin 40 MG tablet  Commonly known as:  LIPITOR  Take 40 mg by mouth at bedtime.     budesonide-formoterol 160-4.5 MCG/ACT inhaler  Commonly known as:  SYMBICORT  Inhale 2 puffs into the lungs 2 (two) times daily.      ferrous sulfate 325 (65 FE) MG tablet  Take 325 mg by mouth daily.     guaiFENesin 600 MG 12 hr tablet  Commonly known as:  MUCINEX  Take 1 tablet (600 mg total) by mouth 2 (two) times daily.     loperamide 2 MG tablet  Commonly known as:  IMODIUM A-D  Take 2 tablets (4 mg total) by mouth 4 (four) times daily as needed for diarrhea or loose stools.     magnesium oxide 400 MG tablet  Commonly known as:  MAG-OX  Take 1 tablet (400 mg total) by mouth every evening.     ondansetron 8 MG disintegrating tablet  Commonly known as:  ZOFRAN ODT  Take 1 tablet (8 mg total) by mouth every 8 (eight) hours as needed for nausea or vomiting.     polyethylene glycol packet  Commonly known as:  MIRALAX / GLYCOLAX  Take 17 g by mouth daily as needed for mild constipation, moderate constipation or severe constipation.     sevelamer carbonate 800 MG tablet  Commonly known as:  RENVELA  Take 800 mg by mouth 3 (three) times daily before meals.     tiotropium 18 MCG inhalation capsule  Commonly known as:  SPIRIVA  Place 1 capsule (18 mcg total) into inhaler and inhale daily.        Allergies:  Allergies  Allergen Reactions  . Penicillins Hives, Itching and Swelling    Has patient had a PCN reaction causing immediate rash, facial/tongue/throat swelling, SOB or lightheadedness with hypotension: Yes Has patient had a PCN reaction causing severe rash involving mucus membranes or skin necrosis: No Has patient had a PCN reaction that required hospitalization No Has patient had a PCN reaction occurring within the last 10 years: No If all of the above answers are "NO", then may proceed with Cephalosporin use.    Family History: Family History  Problem Relation Age of Onset  . Stroke Mother     Social History:  reports that he quit smoking about 8 months ago. He has never used smokeless tobacco. He reports that he does not drink alcohol or use illicit drugs.  ROS: 12 point ROS otherwise negative  except for HPI                                        Physical Exam: BP 117/62 mmHg  Pulse 79  Temp(Src) 97.5 F (36.4 C) (Oral)  Resp 24  Ht 6\' 2"  (1.88 m)  Wt 187 lb 9.6 oz (85.095 kg)  BMI 24.08 kg/m2  SpO2 98%  Constitutional:  Alert and oriented, No acute distress. HEENT: [place] AT,  moist mucus membranes.  Trachea midline, no masses. Cardiovascular: No clubbing, cyanosis, or edema. Respiratory: Normal respiratory effort, no increased work of breathing. GI: Abdomen is soft, nontender, nondistended, no abdominal masses GU: No CVA tenderness. Normal phallus. No drainage appreciated on my exam. Testicles descended bilaterally. No suprapubic fullness.  Skin: No rashes, bruises or suspicious lesions. Lymph: No cervical or inguinal adenopathy. Neurologic: Grossly intact, no focal deficits, moving all 4 extremities. Psychiatric: Normal mood and affect.  Laboratory Data: Lab Results  Component Value Date   WBC 7.4 01/24/2016   HGB 9.0* 01/24/2016   HCT 28.6* 01/24/2016   MCV 85.2 01/24/2016   PLT 210 01/24/2016    Lab Results  Component Value Date   CREATININE 2.05* 01/24/2016    Lab Results  Component Value Date   PSA 2.00 11/08/2015   PSA 1.44 06/08/2015   PSA 2.15 01/30/2015    Lab Results  Component Value Date   TESTOSTERONE 28* 01/30/2015    Lab Results  Component Value Date   HGBA1C UNABLE TO REPORT A1C DUE TO UNKNOWN 10/19/2015    Urinalysis    Component Value Date/Time   COLORURINE YELLOW* 03/21/2015 1043   COLORURINE Yellow 01/20/2015 1609   APPEARANCEUR Cloudy* 04/10/2015 1206   APPEARANCEUR TURBID* 03/21/2015 1043   APPEARANCEUR Turbid 01/20/2015 1609   LABSPEC 1.012 03/21/2015 1043   LABSPEC 1.011 01/20/2015 1609   PHURINE 7.0 03/21/2015 1043   PHURINE 7.0 01/20/2015 1609   GLUCOSEU Trace* 04/10/2015 1206   GLUCOSEU Negative 01/20/2015 1609   HGBUR 2+* 03/21/2015 1043   HGBUR 2+ 01/20/2015 1609   BILIRUBINUR Negative  04/10/2015 1206   BILIRUBINUR NEGATIVE 03/21/2015 1043   BILIRUBINUR Negative 01/20/2015 1609   KETONESUR NEGATIVE 03/21/2015 1043   KETONESUR Negative 01/20/2015 1609   PROTEINUR 3+* 04/10/2015 1206   PROTEINUR >500* 03/21/2015 1043   PROTEINUR 100 mg/dL 01/20/2015 1609   NITRITE Negative 04/10/2015 1206   NITRITE NEGATIVE 03/21/2015 1043   NITRITE Negative 01/20/2015 1609   LEUKOCYTESUR 3+* 04/10/2015 1206   LEUKOCYTESUR 2+* 03/21/2015 1043   LEUKOCYTESUR 2+ 01/20/2015 1609      Assessment & Plan:    1. Urethral drainage The patient is anuric and asymptomatic from this drainage at this time. This is likely debris that tends to collect in the bladder of patients who do not make urine. No intervention is indicated. Recommend against sending culture unless patient becomes symptomatic.  2. Metastatic prostate cancer Continue scheduled follow-up at [facility] for Lupron  3. History of bladder cancer The patient is overdue for surveillance cystoscopy. He should follow-up as an outpatient for this procedure.  4. Bilateral hydronephrosis No intervention indicated. The patient is anuric and on hemodialysis    Nickie Retort, MD  [facility] [address] [place], [place] [address] [phone]

## 2016-01-25 NOTE — Plan of Care (Signed)
Problem: Safety: Goal: Ability to remain free from injury will improve Outcome: Progressing No injury  Problem: Health Behavior/Discharge Planning: Goal: Ability to manage health-related needs will improve Outcome: Progressing Plans to return to [facility] at discharge  Problem: Pain Managment: Goal: General experience of comfort will improve Outcome: Progressing Pain relief back/abdomen with Oxy IR  Problem: Physical Regulation: Goal: Ability to maintain clinical measurements within normal limits will improve Outcome: Progressing VSS. Norvasc was held today due to HD and borderline low bp. NSR with multifocal PVC's Goal: Will remain free from infection Outcome: Progressing Afebrile.  WBC WNL.  Cultures negative. Surgical sites look good  Problem: Skin Integrity: Goal: Risk for impaired skin integrity will decrease Outcome: Progressing Surgical sites without problem.  Problem: Tissue Perfusion: Goal: Risk factors for ineffective tissue perfusion will decrease Outcome: Progressing SQ heparin  Problem: Activity: Goal: Risk for activity intolerance will decrease Outcome: Progressing Generalized weakness.  Problem: Fluid Volume: Goal: Ability to maintain a balanced intake and output will improve Outcome: Progressing HD took of 1.8 L today  Problem: Nutrition: Goal: Adequate nutrition will be maintained Outcome: Progressing Poor appetite.  Some nausea during HD  Problem: Bowel/Gastric: Goal: Will not experience complications related to bowel motility Outcome: Progressing Soft abdomen.  Positive bowel sounds. Refused colace.

## 2016-01-25 NOTE — Telephone Encounter (Signed)
Done  He had already been schd with dr. Tresa Moore so i just changed the appt to a cysto  Sharyn Lull

## 2016-01-25 NOTE — Progress Notes (Signed)
Told Dr. Emmit Alexanders report from EKG and troponin. Informed MD pt now has upper abdominal pain and will give morphine for pain. Will continue to monitor.

## 2016-01-25 NOTE — Telephone Encounter (Signed)
-----   Message from Nickie Retort, MD sent at 01/25/2016 12:09 PM EDT ----- Patient needs to f/u for surveillance cystoscopy. He is a Dr. Erlene Quan patient. He is admitted to hospital now. No urgency in scheduling.

## 2016-01-26 ENCOUNTER — Encounter: Payer: Self-pay | Admitting: Vascular Surgery

## 2016-01-26 ENCOUNTER — Inpatient Hospital Stay (HOSPITAL_COMMUNITY)
Admit: 2016-01-26 | Discharge: 2016-01-26 | Disposition: A | Discharge status: Home / Self Care | Payer: Medicare Other | Attending: Internal Medicine | Admitting: Internal Medicine

## 2016-01-26 DIAGNOSIS — I351 Nonrheumatic aortic (valve) insufficiency: Secondary | ICD-10-CM

## 2016-01-26 DIAGNOSIS — I723 Aneurysm of iliac artery: Secondary | ICD-10-CM | POA: Insufficient documentation

## 2016-01-26 DIAGNOSIS — I4891 Unspecified atrial fibrillation: Secondary | ICD-10-CM

## 2016-01-26 DIAGNOSIS — J449 Chronic obstructive pulmonary disease, unspecified: Secondary | ICD-10-CM | POA: Insufficient documentation

## 2016-01-26 DIAGNOSIS — I714 Abdominal aortic aneurysm, without rupture: Principal | ICD-10-CM

## 2016-01-26 DIAGNOSIS — N186 End stage renal disease: Secondary | ICD-10-CM

## 2016-01-26 DIAGNOSIS — J432 Centrilobular emphysema: Secondary | ICD-10-CM

## 2016-01-26 DIAGNOSIS — Z992 Dependence on renal dialysis: Secondary | ICD-10-CM

## 2016-01-26 DIAGNOSIS — J441 Chronic obstructive pulmonary disease with (acute) exacerbation: Secondary | ICD-10-CM

## 2016-01-26 LAB — CBC
HCT: 22.9 % — ABNORMAL LOW (ref 40.0–52.0)
HEMOGLOBIN: 7 g/dL — AB (ref 13.0–18.0)
MCH: 26.3 pg (ref 26.0–34.0)
MCHC: 30.3 g/dL — AB (ref 32.0–36.0)
MCV: 86.6 fL (ref 80.0–100.0)
PLATELETS: 117 10*3/uL — AB (ref 150–440)
RBC: 2.65 MIL/uL — ABNORMAL LOW (ref 4.40–5.90)
RDW: 17.1 % — ABNORMAL HIGH (ref 11.5–14.5)
WBC: 16.2 10*3/uL — ABNORMAL HIGH (ref 3.8–10.6)

## 2016-01-26 LAB — BASIC METABOLIC PANEL
ANION GAP: 10 (ref 5–15)
BUN: 20 mg/dL (ref 6–20)
CALCIUM: 7.8 mg/dL — AB (ref 8.9–10.3)
CHLORIDE: 97 mmol/L — AB (ref 101–111)
CO2: 30 mmol/L (ref 22–32)
CREATININE: 3.34 mg/dL — AB (ref 0.61–1.24)
GFR calc Af Amer: 19 mL/min — ABNORMAL LOW (ref >60)
GFR calc non Af Amer: 17 mL/min — ABNORMAL LOW (ref >60)
GLUCOSE: 92 mg/dL (ref 65–99)
Potassium: 4.3 mmol/L (ref 3.5–5.1)
Sodium: 137 mmol/L (ref 135–145)

## 2016-01-26 LAB — MAGNESIUM: Magnesium: 1.7 mg/dL (ref 1.7–2.4)

## 2016-01-26 MED ORDER — AMIODARONE HCL IN DEXTROSE 360-4.14 MG/200ML-% IV SOLN
60.0000 mg/h | INTRAVENOUS | Status: AC
  Administered 2016-01-26 (×2): 33.33 mL/h via INTRAVENOUS
  Filled 2016-01-26: qty 200

## 2016-01-26 MED ORDER — IPRATROPIUM-ALBUTEROL 0.5-2.5 (3) MG/3ML IN SOLN
3.0000 mL | RESPIRATORY_TRACT | Status: DC
  Administered 2016-01-26 – 2016-01-27 (×6): 3 mL via RESPIRATORY_TRACT
  Filled 2016-01-26 (×6): qty 3

## 2016-01-26 MED ORDER — EPOETIN ALFA 10000 UNIT/ML IJ SOLN
10000.0000 [IU] | INTRAMUSCULAR | Status: DC
  Administered 2016-01-27: 10000 [IU] via INTRAVENOUS
  Filled 2016-01-26: qty 1

## 2016-01-26 MED ORDER — AMIODARONE LOAD VIA INFUSION
150.0000 mg | Freq: Once | INTRAVENOUS | Status: AC
  Administered 2016-01-26: 150 mg via INTRAVENOUS
  Filled 2016-01-26: qty 83.34

## 2016-01-26 MED ORDER — BUDESONIDE 0.5 MG/2ML IN SUSP
0.5000 mg | Freq: Two times a day (BID) | RESPIRATORY_TRACT | Status: DC
  Administered 2016-01-26 – 2016-01-29 (×7): 0.5 mg via RESPIRATORY_TRACT
  Filled 2016-01-26 (×7): qty 2

## 2016-01-26 MED ORDER — AMIODARONE HCL IN DEXTROSE 360-4.14 MG/200ML-% IV SOLN
30.0000 mg/h | INTRAVENOUS | Status: AC
  Administered 2016-01-26: 30 mg/h via INTRAVENOUS
  Filled 2016-01-26 (×4): qty 200

## 2016-01-26 MED ORDER — METHYLPREDNISOLONE SODIUM SUCC 40 MG IJ SOLR
20.0000 mg | Freq: Two times a day (BID) | INTRAMUSCULAR | Status: DC
  Administered 2016-01-26 – 2016-01-28 (×6): 20 mg via INTRAVENOUS
  Administered 2016-01-28: 40 mg via INTRAVENOUS
  Administered 2016-01-29: 20 mg via INTRAVENOUS
  Filled 2016-01-26 (×7): qty 1

## 2016-01-26 NOTE — Progress Notes (Signed)
Since patient converted to NSR he has remained in normal rhythm on amio drip.  Alert to self and place but confused to situation. o2 sats upper 90's to 100% on 2L.  Anuric.  Hemodialysis planned for tomorrow. Patient's family member [name] visited this evening.

## 2016-01-26 NOTE — Progress Notes (Signed)
[facility]  ROUNDING NOTE   Subjective:  Pt had HD yesterday. Resting comfortably in bed.  Good sat at 100%.  Case discussed with Dr. Mortimer Fries.    Objective:  Vital signs in last 24 hours:  Temp:  [97.4 F (36.3 C)-98.6 F (37 C)] 97.7 F (36.5 C) (05/05 0746) Pulse Rate:  [73-88] 84 (05/05 0700) Resp:  [15-29] 21 (05/05 0700) BP: (86-134)/(48-74) 99/60 mmHg (05/05 0700) SpO2:  [89 %-100 %] 100 % (05/05 0700)  Weight change:  Filed Weights   01/23/16 0904 01/23/16 2050 01/24/16 0700  Weight: 84.823 kg (187 lb) 85.095 kg (187 lb 9.6 oz) 85.095 kg (187 lb 9.6 oz)    Intake/Output: I/O last 3 completed shifts: In: [id number] [P.O.:75; I.V.:900; IV Piggyback:750] Out: [id number] [Emesis/NG output:50; Other:1800]   Intake/Output this shift:     Physical Exam: General: NAD, resting in bed  Head: Normocephalic, atraumatic. Moist oral mucosal membranes  Eyes: Anicteric, PERR  Neck: Supple, trachea midline  Lungs:  Basilar rales, normal effort  Heart: irregular  Abdomen:  Soft, non tender, non distended  Extremities: trace peripheral edema.  Neurologic: Nonfocal, moving all four extremities  Skin: No lesions  Access: LUE AVF    Basic Metabolic Panel:  Recent Labs Lab 01/23/16 0909 01/24/16 0756 01/24/16 1058 01/26/16 0501  NA 140  --  140 137  K 4.5  --  4.0 4.3  CL 102  --  98* 97*  CO2 30  --  30 30  GLUCOSE 85  --  92 92  BUN 63*  --  31* 20  CREATININE 3.76*  --  2.05* 3.34*  CALCIUM 8.4*  --  8.4* 7.8*  MG  --   --   --  1.7  PHOS  --  4.3  --   --     Liver Function Tests: No results for input(s): AST, ALT, ALKPHOS, BILITOT, PROT, ALBUMIN in the last 168 hours. No results for input(s): LIPASE, AMYLASE in the last 168 hours. No results for input(s): AMMONIA in the last 168 hours.  CBC:  Recent Labs Lab 01/24/16 1058 01/26/16 0501  WBC 7.4 16.2*  HGB 9.0* 7.0*  HCT 28.6* 22.9*  MCV 85.2 86.6  PLT 210 117*    Cardiac Enzymes:  Recent  Labs Lab 01/24/16 2311 01/25/16 0504 01/25/16 1059  TROPONINI 0.03 0.03 0.04*    BNP: Invalid input(s): POCBNP  CBG:  Recent Labs Lab 01/24/16 1821 01/24/16 2031  GLUCAP 80 98    Microbiology: Results for orders placed or performed during the hospital encounter of 01/23/16  Culture, fungus without smear ([facility]-Only)     Status: None (Preliminary result)   Collection Time: 01/23/16 10:03 PM  Result Value Ref Range Status   Specimen Description CATH TIP  Final   Special Requests NONE  Final   Culture NO FUNGUS ISOLATED AFTER 2 DAYS  Final   Report Status PENDING  Incomplete  MRSA PCR Screening     Status: None   Collection Time: 01/24/16 12:02 AM  Result Value Ref Range Status   MRSA by PCR NEGATIVE NEGATIVE Final    Comment:        The GeneXpert MRSA Assay (FDA approved for NASAL specimens only), is one component of a comprehensive MRSA colonization surveillance program. It is not intended to diagnose MRSA infection nor to guide or monitor treatment for MRSA infections.     Coagulation Studies: No results for input(s): LABPROT, INR in the last 72 hours.  Urinalysis: No results for input(s): COLORURINE, LABSPEC, PHURINE, GLUCOSEU, HGBUR, BILIRUBINUR, KETONESUR, PROTEINUR, UROBILINOGEN, NITRITE, LEUKOCYTESUR in the last 72 hours.  Invalid input(s): APPERANCEUR    Imaging: Dg Chest Port 1 View  01/24/2016  CLINICAL DATA:  Bedside central venous catheter placement. EXAM: PORTABLE CHEST 1 VIEW 5:19 p.m.: COMPARISON:  Portable chest x-ray earlier today 5:08 a.m. and previously. FINDINGS: Right jugular central venous catheter tip projects over the lower SVC at or near the cavoatrial junction. No evidence of pneumothorax mediastinal hematoma. Cardiac silhouette moderately enlarged, unchanged. Pulmonary venous hypertension and mild interstitial pulmonary edema, unchanged. Bilateral pleural effusions, unchanged. Atelectasis involving the lower lobes, left greater than  right, unchanged. No new pulmonary parenchymal abnormality. IMPRESSION: 1. Right jugular central venous catheter tip projects over the lower SVC at or near the cavoatrial junction. No acute complicating features. 2. Stable mild CHF, small bilateral pleural effusions and bilateral lower lobe atelectasis, left greater than right, since earlier today. No new abnormalities. Electronically Signed   By: Evangeline Dakin M.D.   On: 01/24/2016 17:50     Medications:   . DOPamine    . nitroGLYCERIN     . amiodarone  200 mg Oral Daily  . amLODipine  2.5 mg Oral Daily  . atorvastatin  40 mg Oral QHS  . budesonide (PULMICORT) nebulizer solution  0.5 mg Nebulization BID  . docusate sodium  100 mg Oral Daily  . [START ON 01/27/2016] famotidine (PEPCID) IV  20 mg Intravenous Q48H  . ferrous sulfate  325 mg Oral Daily  . guaiFENesin  600 mg Oral BID  . heparin  5,000 Units Subcutaneous Q8H  . ipratropium-albuterol  3 mL Nebulization Q4H  . magnesium oxide  400 mg Oral QPM  . methylPREDNISolone (SOLU-MEDROL) injection  20 mg Intravenous Q12H  . mometasone-formoterol  2 puff Inhalation BID  . sevelamer carbonate  800 mg Oral TID AC  . tiotropium  18 mcg Inhalation Daily   sodium chloride, acetaminophen **OR** acetaminophen, albuterol, alum & mag hydroxide-simeth, guaiFENesin-dextromethorphan, hydrALAZINE, labetalol, loperamide, magnesium sulfate 1 - 4 g bolus IVPB, metoprolol, morphine injection, ondansetron, ondansetron, oxyCODONE, phenol, polyethylene glycol, potassium chloride, zolpidem  Assessment/ Plan:  75 y.o. 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  ESRD on HD TTHS: Patient had hemodialysis yesterday. No acute indication for dialysis today. We will set him up for dialysis tomorrow.  2.  Anemia of CKD:  Hemoglobin 9.0 at last check. We will start the patient on Epogen 10,000 units IV with dialysis.   3.  SHPTH:  Phosphorus 4.3 and PTH 198. Continue to periodically monitor.  4.  HTN:  Blood pressure 99/60.  Continue amlodipine.   5.  AAA with type I endoleak and right common femoral aneurysm:  Patient with endovascular repair of abdominal aortic aneurysm as well as stent placement in the right common femoral artery. Management per vascular surgery.    LOS: 3 Dayle Mcnerney 5/5/20178:25 AM

## 2016-01-26 NOTE — Consult Note (Signed)
Progress note   PATIENT NAME: [name]    MR#:  [id number]  DATE OF BIRTH:  Jun 02, 1941  CHIEF COMPLAINT:   Chief Complaint  Patient presents with  . Groin Pain   HPI  Patient alert and awake, follows commands No chest pain, lower abd pain, however he feels SOB, has some wheezing Will start steroids and Aggressive BD therapy  REVIEW OF SYSTEMS:   Review of Systems  Constitutional: Negative for fever, chills, weight loss and malaise/fatigue.  HENT: Negative for congestion.   Eyes: Negative for blurred vision.  Respiratory: Positive for shortness of breath and wheezing. Negative for cough.   Cardiovascular: Negative for chest pain and palpitations.  Genitourinary: Negative for dysuria.  Musculoskeletal: Negative for myalgias.  Skin: Negative for rash.  Neurological: Negative for dizziness and headaches.  Endo/Heme/Allergies: Does not bruise/bleed easily.   Physical Exam  Constitutional: He is oriented to person, place, and time. He appears distressed.  HENT:  Head: Normocephalic and atraumatic.  Eyes: Pupils are equal, round, and reactive to light.  Neck: Neck supple.  Cardiovascular: Normal rate and regular rhythm.   No murmur heard. Pulmonary/Chest: He is in respiratory distress. He has wheezes.  Abdominal: Soft.  Musculoskeletal: Normal range of motion.  Neurological: He is alert and oriented to person, place, and time. No cranial nerve deficit.  Skin: Skin is warm. He is not diaphoretic. No erythema.       DRUG ALLERGIES:   Allergies  Allergen Reactions  . Penicillins Hives, Itching and Swelling    Has patient had a PCN reaction causing immediate rash, facial/tongue/throat swelling, SOB or lightheadedness with hypotension: Yes Has patient had a PCN reaction causing severe rash involving mucus membranes or skin necrosis: No Has patient had a PCN reaction that required hospitalization No Has patient had a PCN reaction occurring within the last 10  years: No If all of the above answers are "NO", then may proceed with Cephalosporin use.    VITALS:  Blood pressure 99/60, pulse 84, temperature 97.7 F (36.5 C), temperature source Axillary, resp. rate 21, height 6\' 2"  (1.88 m), weight 187 lb 9.6 oz (85.095 kg), SpO2 100 %.     LABORATORY PANEL:   CBC  Recent Labs Lab 01/26/16 0501  WBC 16.2*  HGB 7.0*  HCT 22.9*  PLT 117*   ------------------------------------------------------------------------------------------------------------------  Chemistries   Recent Labs Lab 01/26/16 0501  NA 137  K 4.3  CL 97*  CO2 30  GLUCOSE 92  BUN 20  CREATININE 3.34*  CALCIUM 7.8*  MG 1.7   ------------------------------------------------------------------------------------------------------------------  Cardiac Enzymes  Recent Labs Lab 01/25/16 1059  TROPONINI 0.04*   ------------------------------------------------------------------------------------------------------------------  RADIOLOGY:  Dg Chest Port 1 View  01/24/2016  CLINICAL DATA:  Bedside central venous catheter placement. EXAM: PORTABLE CHEST 1 VIEW 5:19 p.m.: COMPARISON:  Portable chest x-ray earlier today 5:08 a.m. and previously. FINDINGS: Right jugular central venous catheter tip projects over the lower SVC at or near the cavoatrial junction. No evidence of pneumothorax mediastinal hematoma. Cardiac silhouette moderately enlarged, unchanged. Pulmonary venous hypertension and mild interstitial pulmonary edema, unchanged. Bilateral pleural effusions, unchanged. Atelectasis involving the lower lobes, left greater than right, unchanged. No new pulmonary parenchymal abnormality. IMPRESSION: 1. Right jugular central venous catheter tip projects over the lower SVC at or near the cavoatrial junction. No acute complicating features. 2. Stable mild CHF, small bilateral pleural effusions and bilateral lower lobe atelectasis, left greater than right, since earlier today. No  new abnormalities. Electronically Signed  By: Evangeline Dakin M.D.   On: 01/24/2016 17:50    EKG:   Orders placed or performed during the hospital encounter of 01/23/16  . EKG 12-Lead  . EKG 12-Lead  . EKG 12-Lead  . EKG 12-Lead    ASSESSMENT AND PLAN:  75 yo AAM with  symptomatic common femoral artery aneurysm  seen by vascular and taken to surgery today. H/o  secondary to cardiac arrhythmia, ESRD, COPD.   1.optimze cardiac meds 2.start COPD exec regimen-start solumedrol 20mg  bid, start dounebs every 4 hrs, Pulmicort nebs 3.follow up vasc surgery recs 4.ESRD on HD-hd as needed 5.continue step down status 6.DVT prx     The Patient requires high complexity decision making for assessment and support, frequent evaluation and titration of therapies.  Patient  satisfied with Plan of action and management. All questions answered  Corrin Parker, M.D.  [facility]  Medical Director [place] Director [facility] Cardio-Pulmonary Department

## 2016-01-26 NOTE — Progress Notes (Signed)
[facility]  Daily Progress Note   Subjective  - 2 Days Post-Op  Somewhat irritated this am No events overnight  Objective Filed Vitals:   01/26/16 0600 01/26/16 0700 01/26/16 0746 01/26/16 0800  BP: 93/53 99/60  102/69  Pulse:  84  84  Temp:   97.7 F (36.5 C)   TempSrc:   Axillary   Resp: 25 21  29   Height:      Weight:      SpO2: 100% 100%  100%    Intake/Output Summary (Last 24 hours) at 01/26/16 0852 Last data filed at 01/25/16 1600  Gross per 24 hour  Intake    100 ml  Output   1850 ml  Net  -1750 ml    PULM  CTAB CV  RRR VASC  Pedal pulses present, femoral aneurysm smaller and not pulsatile  Laboratory CBC    Component Value Date/Time   WBC 16.2* 01/26/2016 0501   WBC 11.7* 01/20/2015 0430   HGB 7.0* 01/26/2016 0501   HGB 6.9* 01/20/2015 0430   HCT 22.9* 01/26/2016 0501   HCT 20.0* 06/08/2015 1300   HCT 21.5* 01/20/2015 0430   PLT 117* 01/26/2016 0501   PLT 176 01/20/2015 0430    BMET    Component Value Date/Time   NA 137 01/26/2016 0501   NA 141 01/20/2015 0430   K 4.3 01/26/2016 0501   K 3.6 01/20/2015 0430   CL 97* 01/26/2016 0501   CL 99* 01/20/2015 0430   CO2 30 01/26/2016 0501   CO2 30 01/20/2015 0430   GLUCOSE 92 01/26/2016 0501   GLUCOSE 119* 01/20/2015 0430   BUN 20 01/26/2016 0501   BUN 32* 01/20/2015 0430   CREATININE 3.34* 01/26/2016 0501   CREATININE 6.73* 01/20/2015 0430   CALCIUM 7.8* 01/26/2016 0501   CALCIUM 7.8* 01/20/2015 0430   GFRNONAA 17* 01/26/2016 0501   GFRNONAA 7* 01/20/2015 0430   GFRNONAA 14* 08/17/2014 1228   GFRAA 19* 01/26/2016 0501   GFRAA 9* 01/20/2015 0430   GFRAA 17* 08/17/2014 1228    Assessment/Planning: POD #2 s/p aneurysm repair femoral and aortic   Seems to be near baseline, which is poor  May be able to go back to SNF later today or tomorrow  Regular diet  Dialysis per nephrology    [name]  01/26/2016, 8:52 AM

## 2016-01-26 NOTE — Progress Notes (Signed)
Patient is in sustained SVT that appears to be going in and out of afib with RVR.  RN made Dr. Mortimer Fries aware and MD ordered Amiodarone bolus with infusion and cardiology consult.

## 2016-01-26 NOTE — Consult Note (Signed)
Cardiology Consultation Note  Patient ID: [name], MRN: [id number], DOB/AGE: 1941/07/14 75 y.o. Admit date: 01/23/2016   Date of Consult: 01/26/2016 Primary Physician: Dion Body, MD Primary Cardiologist: Jerilynn Mages. Fletcher Anon, MD Requesting Physician: Corbin Ade, MD  Chief Complaint: Groin pain Reason for Consult: Afib with RVR/SVT  HPI: 75 y.o. 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   He was last seen in clinic on 02/27/2015 s/p prior admission in April for Aib with RVR. At that time he had an echo done that showed an EF of 25-30% with anterior wall motion abnormality. He had a repeat echo done in May 2016 that showed improved EF to 45-50% with moderate-sized pericardial effusion. Shortly after this echo he was hospitalized in May 2016 for worsening anemia. Hr was seen by GI and felt to be too high risk for endoscopy given his multiple comorbidities. He developed Afib with RVR. His most recent echo from 02/2015 (in preparation for the above removal of his ureteral stents) showed EF of 60-65%, no RWMA, GR1DD, mild AI, left atrium was mildly dilated, PASP normal.   He has been admitted 05/2015 with worsening anemia. Required transfusion. GI advised outpatient follow up. Admitted 06/2015, 08/2015, 09/2015 with volume overload s/p missing dialysis. He was diuresed and sent home both  times.     This admission the patient developed Afib with RVR/SVT on 5.5 AM. He has undergone successful endovascular repair of AAA as well as stent placement in the right common femoral artery. He has also been noted to have a while penile discharge. Labs show WBC 7.4 on 5/3-->16.2 today, HGB 9.0-->7.0, K+ 4.0-->4.3, Mg++ 2.0-->1.7, troponin 0.03 x 2-->0.04. NSR with PVCs, 78 bpm, prolonged QTc 476 msec (possibly in the setting of the patient's PVC), lateral TWI. Hand calculated QTc: 453 msec from EKG on 5/3. He has been placed on amiodarone infusion with heart rate currently back in sinus rhythm in the 80's bpm as of 3 PM. Echo is pending. BP has been stable on IV amiodarone with readings throughout his admission. He is currently asymptomatic.    Past Medical History  Diagnosis Date  . PAF (paroxysmal atrial fibrillation) ([place])     a. new onset 12/2014 in the setting of UTI, sepsis, hypotension, and anemia; b. not on long term anticoagulation given anemia; c. family aware of stroke risk, they are ok with this; d. on amiodarone   . Chronic combined systolic and diastolic CHF (congestive heart failure) ([place])     a. echo 12/2014: EF 25-30%, anterior wall wall motion abnormalities; b. planned ischemic evaluation once patient is stable medically; c. echo 01/2015: EF 45-50%, no RWMA, mild AT, LA mildly dilated, mod pericardial effusion along LV free wall, no evidence of hemodynamic compromise  . ESRD on hemodialysis ([place])     a. Tuesday, Thursday, and Saturdays  . Anemia     a. baseline  hgb ~ 8  . History of small bowel obstruction     a. 01/2015  . History of septic shock     a. 01/2015  . GERD (gastroesophageal reflux disease)   . Allergic rhinitis   . COPD (chronic obstructive pulmonary disease) ([place])   . Hypercholesteremia   . Cognitive communication deficit   . Magnesium deficiency   . Dementia   . 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 Incontinence   . Hydronephrosis   . Overactive bladder   . Detrusor  sphincter dyssynergia   . Mini stroke ([place])   . Bladder cancer ([place])   . Prostate cancer ([place])   . Renal insufficiency   . Hypertension       Most Recent Cardiac Studies: As above   Surgical History:  Past Surgical History  Procedure Laterality Date  . Cystoscopy w/ ureteral stent placement    . Transurethral resection of bladder tumor with gyrus (turbt-gyrus)    . Ureteral stent placement    . Prostate surgery      Removal  . Av fistula placement      left arm  . Peripheral vascular catheterization N/A 01/24/2016    Procedure: Endovascular Repair/Stent Graft;  Surgeon: Katha Cabal, MD;  Location: [facility];  Service: Cardiovascular;  Laterality: N/A;     Home Meds: Prior to Admission medications   Medication Sig Start Date End Date Taking? Authorizing Provider  acetaminophen (TYLENOL) 325 MG tablet Take 650 mg by mouth every 8 (eight) hours as needed. For general discomfort   Yes Historical Provider, MD  albuterol (PROVENTIL) (2.5 MG/3ML) 0.083% nebulizer solution Take 3 mLs (2.5 mg total) by nebulization every 4 (four) hours as needed for wheezing or shortness of breath. 06/10/15  Yes Fritzi Mandes, MD  amiodarone (PACERONE) 200 MG tablet Take 1 tablet (200 mg total) by mouth daily. 09/13/15  Yes Gladstone Lighter, MD  amLODipine (NORVASC) 2.5 MG tablet Take 2.5 mg by mouth daily.   Yes Historical Provider, MD  atorvastatin (LIPITOR) 40 MG tablet Take 40 mg by mouth at bedtime.   Yes Historical Provider, MD  budesonide-formoterol (SYMBICORT) 160-4.5 MCG/ACT inhaler Inhale 2 puffs into the lungs 2 (two) times daily.   Yes Historical Provider, MD  ferrous sulfate 325 (65 FE) MG tablet Take 325 mg by mouth daily.   Yes Historical Provider, MD  guaiFENesin (MUCINEX) 600 MG 12 hr tablet Take 1 tablet (600 mg total) by mouth 2 (two) times daily. 09/13/15  Yes Gladstone Lighter, MD  loperamide (IMODIUM A-D) 2 MG tablet Take 2 tablets (4 mg total) by mouth 4 (four) times  daily as needed for diarrhea or loose stools. 11/23/15  Yes Carrie Mew, MD  magnesium oxide (MAG-OX) 400 MG tablet Take 1 tablet (400 mg total) by mouth every evening. 09/13/15  Yes Gladstone Lighter, MD  ondansetron (ZOFRAN ODT) 8 MG disintegrating tablet Take 1 tablet (8 mg total) by mouth every 8 (eight) hours as needed for nausea or vomiting. 11/23/15  Yes Carrie Mew, MD  polyethylene glycol [facility] / Floria Raveling) packet Take 17 g by mouth daily as needed for mild constipation, moderate constipation or severe constipation.   Yes Historical Provider, MD  sevelamer carbonate (RENVELA) 800 MG tablet Take 800 mg by mouth 3 (three) times daily before meals.    Yes Historical Provider, MD  tiotropium (SPIRIVA) 18 MCG inhalation capsule Place 1 capsule (18 mcg total) into inhaler and inhale daily. 09/13/15  Yes Gladstone Lighter, MD  Inpatient Medications:  . amLODipine  2.5 mg Oral Daily  . atorvastatin  40 mg Oral QHS  . budesonide (PULMICORT) nebulizer solution  0.5 mg Nebulization BID  . docusate sodium  100 mg Oral Daily  . [START ON 01/27/2016] epoetin (EPOGEN/PROCRIT) injection  10,000 Units Intravenous Q T,Th,Sa-HD  . [START ON 01/27/2016] famotidine (PEPCID) IV  20 mg Intravenous Q48H  . ferrous sulfate  325 mg Oral Daily  . guaiFENesin  600 mg Oral BID  . heparin  5,000 Units Subcutaneous Q8H  . ipratropium-albuterol  3 mL Nebulization Q4H  . magnesium oxide  400 mg Oral QPM  . methylPREDNISolone (SOLU-MEDROL) injection  20 mg Intravenous Q12H  . mometasone-formoterol  2 puff Inhalation BID  . sevelamer carbonate  800 mg Oral TID AC  . tiotropium  18 mcg Inhalation Daily   . amiodarone    . DOPamine    . nitroGLYCERIN      Allergies:  Allergies  Allergen Reactions  . Penicillins Hives, Itching and Swelling    Has patient had a PCN reaction causing immediate rash, facial/tongue/throat swelling, SOB or lightheadedness with hypotension: Yes Has patient had a PCN reaction  causing severe rash involving mucus membranes or skin necrosis: No Has patient had a PCN reaction that required hospitalization No Has patient had a PCN reaction occurring within the last 10 years: No If all of the above answers are "NO", then may proceed with Cephalosporin use.    Social History   Social History  . Marital Status: Single    Spouse Name: N/A  . Number of Children: N/A  . Years of Education: N/A   Occupational History  . Not on file.   Social History Main Topics  . Smoking status: Former Smoker -- 0.25 packs/day for 64 years    Quit date: 05/13/2015  . Smokeless tobacco: Never Used  . Alcohol Use: No  . Drug Use: No  . Sexual Activity: Not on file   Other Topics Concern  . Not on file   Social History Narrative   Lives at home, has his nephew caring for him.   Wheel chair bound now     Family History  Problem Relation Age of Onset  . Stroke Mother      Review of Systems: Review of Systems  Constitutional: Positive for weight loss and malaise/fatigue. Negative for fever, chills and diaphoresis.  HENT: Negative for congestion.   Eyes: Negative for discharge and redness.  Respiratory: Positive for shortness of breath. Negative for cough, hemoptysis, sputum production and wheezing.   Cardiovascular: Negative for chest pain, palpitations, orthopnea, claudication, leg swelling and PND.  Gastrointestinal: Negative for heartburn, nausea, vomiting, abdominal pain, blood in stool and melena.  Genitourinary: Negative for hematuria.  Musculoskeletal: Negative for myalgias and falls.  Skin: Negative for rash.  Neurological: Positive for weakness. Negative for dizziness, tingling, tremors, sensory change, speech change, focal weakness and loss of consciousness.  Endo/Heme/Allergies: Does not bruise/bleed easily.  Psychiatric/Behavioral: The patient is not nervous/anxious.   All other systems reviewed and are negative.   Labs:  Recent Labs  01/24/16 2311  01/25/16 0504 01/25/16 1059  TROPONINI 0.03 0.03 0.04*   Lab Results  Component Value Date   WBC 16.2* 01/26/2016   HGB 7.0* 01/26/2016   HCT 22.9* 01/26/2016   MCV 86.6 01/26/2016   PLT 117* 01/26/2016    Recent Labs Lab 01/26/16 0501  NA 137  K 4.3  CL 97*  CO2 30  BUN 20  CREATININE 3.34*  CALCIUM 7.8*  GLUCOSE 92   Lab Results  Component Value Date   CHOL 68 01/20/2015   HDL 6* 01/20/2015   LDLCALC 29 01/20/2015   TRIG 72 01/28/2015   No results found for: DDIMER  Radiology/Studies:  Dg Chest 1 View  01/23/2016  CLINICAL DATA:  Cough intermittently for several months EXAM: CHEST 1 VIEW COMPARISON:  11/23/2015 FINDINGS: There are trace bilateral pleural effusions. There is bilateral mild interstitial thickening. There is no pneumothorax. There is mild bibasilar atelectasis. There is stable cardiomegaly. The osseous structures are unremarkable. IMPRESSION: Mild CHF. 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 EXAM: CT ANGIOGRAPHY OF ABDOMINAL AORTA WITH ILIOFEMORAL RUNOFF TECHNIQUE: Multidetector CT imaging of the abdomen, pelvis and lower extremities was performed using the standard protocol during bolus administration of intravenous contrast. Multiplanar CT image reconstructions and MIPs were obtained to evaluate the vascular anatomy. CONTRAST:  100 mL Isovue 370 IV COMPARISON:  Prior CTA of the abdomen and pelvis on 06/09/2015 as well as right groin ultrasound earlier today. FINDINGS: Aorta: The aortic sac surrounding the endograft shows interval enlargement with current maximal dimensions of approximately 5.3 x 6.1 cm compared to maximal diameter on prior study of 5.4 cm. Prominent anterior endoleak again noted extending to the left  side of the aortic sac. This again most likely represents an endoleak supplied by the inferior mesenteric artery. There is at least 1 opacified lumbar artery posteriorly to the left of midline at roughly the L4 level. This may represent additional inflow or potentially outflow. There is no evidence of type 1 endoleak. Distal endograft limbs appear normally patent and stable in position. Stable mild stenosis at the origin of the celiac axis. Stable fusiform dilatation of the SMA trunk. Stable fusiform dilatation of the proximal splenic artery and the common hepatic artery. Right Lower Extremity: Right iliac arteries are tortuous and normally patent. The internal iliac artery is occluded by embolization coils. There is an enlarging aneurysm of the right common femoral artery that also involves the proximal SFA. Based on appearance this is most likely an elongated pseudoaneurysm. Maximal dimensions are currently 9.1 x 6.3 cm transversely and 11.9 cm in maximum height. This represents significant enlargement since prior CTA at which time maximal aneurysm dimensions were approximately 5.9 cm. The right SFA shows diffuse fusiform dilatation calcified plaque with maximum diameter of approximately 14 mm. 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 Just beyond the adductor hiatus and above the knee joint, the popliteal aneurysm measures approximately 4.3 x 4.3 cm. The popliteal artery is tortuous and becomes normal in caliber just below the knee joint. Tibial artery opacification is suboptimal. The posterior tibial artery appears continuously patent into the foot. The peroneal artery also demonstrates patency into the foot. The anterior tibial artery is likely occluded at the mid calf level. Left Lower Extremity: Left native iliac arteries are normally patent. The internal iliac artery is occluded by embolization coils. There is fusiform dilatation of the common femoral artery measuring up to 16 mm in  diameter. The SFA also shows fusiform dilatation and measures 17 mm. Aneurysmal disease of the distal SFA and popliteal artery present. At the juncture of the SFA and popliteal artery near the adductor hiatus, aneurysmal  disease measures 4.8 x 5.3 cm. Tortuous and aneurysmal popliteal artery continues to the knee joint. Below the knee the popliteal artery shows no significant aneurysmal disease. Tibial evaluation is limited with patency of the peroneal artery visible into the foot. The posterior and anterior tibial arteries likely are occluded in the mid to distal calf. There is stable chronic bilateral hydronephrosis and hydroureter with diffuse bladder wall thickening present. Stable bilateral adrenal nodularity. No evidence of bowel obstruction or inflammation. No free air, free fluid or abscess identified in the abdomen or pelvis. No lymphadenopathy. No hernias are seen. Bony structures demonstrate moderate spondylosis of the lumbar spine. Review of the MIP images confirms the above findings. IMPRESSION: 1. 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 There would be concern of potential impending rupture based on progressive pain and significant enlargement since the prior CTA. Vascular surgical consultation is recommended. 2. Interval enlargement of the aneurysm sac surrounding the endograft after prior EVAR. The aneurysm sac now measures 6.1 cm in greatest diameter compared to 5.4 cm on the prior study. A prominent type 2 endoleak is again noted, most likely supplied by the inferior mesenteric artery with at least one opacified left lumbar artery noted which may be acting as outflow. 3. 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 Tortuous aneurysmal disease of the right popliteal artery measures 4.3 cm in diameter and maximal diameter of aneurysmal  disease on the left at the SFA/popliteal junction is 5.3 cm. Tibial evaluation is limited, likely reflecting slow flow. The right anterior tibial artery is likely occluded. The left posterior and anterior tibial arteries are likely occluded. Electronically Signed   By: Aletta Edouard M.D.   On: 01/23/2016 15:48   [place] Extrem Low Right Ltd  01/23/2016  CLINICAL DATA:  Patient presents with a right groin mass with associated pain. Patient with a dialysis patient scheduled for dialysis today. History of right iliac aneurysm. Patient has an aortoiliac stent graft. EXAM: ULTRASOUND RIGHT LOWER EXTREMITY LIMITED TECHNIQUE: Ultrasound examination of the lower extremity soft tissues was performed in the area of clinical concern. COMPARISON:  None FINDINGS: There is a heterogeneous mass with a cystic component in the right inguinal region measuring 8.5 x 6.7 x 6.8 cm. This cystic component measures approximately 8.5 x 5.0 x 5.2 cm. On color Doppler analysis, there is prominent, to and fro blood flow throughout the cystic component. No other abnormalities. IMPRESSION: 1. 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 This was present on the prior CT. There is anterior thrombus with significant turbulent blood flow in the cystic component. Electronically Signed   By: Lajean Manes M.D.   On: 01/23/2016 11:06   Dg Chest Port 1 View  01/24/2016  CLINICAL DATA:  Bedside central venous catheter placement. EXAM: PORTABLE CHEST 1 VIEW 5:19 p.m.: COMPARISON:  Portable chest x-ray earlier today 5:08 a.m. and previously. FINDINGS: Right jugular central venous catheter tip projects over the lower SVC at or near the cavoatrial junction. No evidence of pneumothorax mediastinal hematoma. Cardiac silhouette moderately enlarged, unchanged. Pulmonary venous hypertension and mild interstitial pulmonary edema, unchanged. Bilateral pleural effusions, unchanged. Atelectasis involving the lower lobes, left greater than right,  unchanged. No new pulmonary parenchymal abnormality. IMPRESSION: 1. Right jugular central venous catheter tip projects over the lower SVC at or near the cavoatrial junction. No acute complicating features. 2. Stable mild CHF, small bilateral  pleural effusions and bilateral lower lobe atelectasis, left greater than right, since earlier today. No new abnormalities. Electronically Signed   By: Evangeline Dakin M.D.   On: 01/24/2016 17:50   Dg Chest Port 1 View  01/24/2016  CLINICAL DATA:  Shortness of breath. EXAM: PORTABLE CHEST 1 VIEW COMPARISON:  01/23/2016 FINDINGS: Cardiac enlargement with mild vascular congestion. Slight interstitial pattern in the lung bases likely indicating edema. Small bilateral pleural effusions and basilar atelectasis. Similar appearance to previous study. IMPRESSION: Cardiac enlargement with mild vascular congestion and mild interstitial edema. Small bilateral pleural effusions with basilar atelectasis. Electronically Signed   By: Lucienne Capers M.D.   On: 01/24/2016 05:32    EKG 5/3: NSR with PVCs, 78 bpm, prolonged QTc 476 msec (possibly in the setting of the patient's PVC), lateral TWI Hand calculated QTc: 453 msec from EKG on 5/3  Weights: Filed Weights   01/23/16 0904 01/23/16 2050 01/24/16 0700  Weight: 187 lb (84.823 kg) 187 lb 9.6 oz (85.095 kg) 187 lb 9.6 oz (85.095 kg)     Physical Exam: Blood pressure 112/51, pulse 88, temperature 97.7 F (36.5 C), temperature source Axillary, resp. rate 19, height 6\' 2"  (1.88 m), weight 187 lb 9.6 oz (85.095 kg), SpO2 96 %. Body mass index is 24.08 kg/(m^2). General:Frail appearing, in no acute distress. Head: Normocephalic, atraumatic, sclera non-icteric, no xanthomas, nares are without discharge.  Neck: Negative for carotid bruits. JVD not elevated. Lungs: Clear bilaterally to auscultation without wheezes, rales, or rhonchi. Breathing is unlabored. Heart: RRR with S1 S2. No murmurs, rubs, or gallops  appreciated. Abdomen: Soft, non-tender, non-distended with normoactive bowel sounds. No hepatomegaly. No rebound/guarding. No obvious abdominal masses. Msk:  Strength and tone appear normal for age. Extremities: No clubbing or cyanosis. No edema.  Distal pedal pulses are 2+ and equal bilaterally. Neuro: Alert and oriented X 3. No facial asymmetry. No focal deficit. Moves all extremities spontaneously. Psych:  Responds to questions appropriately with a normal affect.    Assessment and Plan:   1. PAF with RVR/SVT: -Currently in sinus rhythm with heart rates in the 80's bpm -Likely in the setting of the stresses of his recent surgery and acute on chronic anemia with current HGB of 7.0 -Converted to sinus rhythm on amiodarone infusion. Blood pressure stable on IV amiodarone with readings throughout his admission -Continue IV amiodarone per protocol at this time. If blood pressures start to drop, would need to decrease to 1/2 dose or hold infusion -Perhaps he can transition to PO amiodarone in the next day or two pending underlying factors/driving forces -Given his acute on chronic anemia he is not a full-dose anticoagulation candidate at this time -Blood pressure is too soft for the addition of rate controlling medications such as BB or CCB -Not a candidate for digoxin given her ESRD -CHADS2VASc at least 3 (CHF, HTN, age x 1)  2. Chronic combined CHF: -He does not appear to be volume overloaded at this time and is laying supine without issues -Volume managed by HD -Not on BB at this time given hypotension  -Would hold amlodipine given the patient's hypotension. This may allow for room to add BB as his admission progresses   3. Status post endovascular repair of AAA and femoral aneurysm: -Seems stable -Per vascular -Optimal BP and lipids are advised   4. Hypotension: -As above -Stable throughout this admission -Would hold amlodipine  -Monitor while on amiodarone infusion   5. ESRD on  HD: -Per renal  6. Acute  on chronic anemia: -Transfuse to HGB of >8.5 -Has been started on EPO per renal  7. COPD: -No acute exacerbation    Signed, Christell Faith, PA-C Pager: [phone] 01/26/2016, 3:29 PM

## 2016-01-26 NOTE — Progress Notes (Signed)
Heart rhythm back in NSR rate 80's.

## 2016-01-26 NOTE — Progress Notes (Signed)
RN made Dr. Mortimer Fries and Dr. Vianne Bulls aware that patient has had 2 different runs of SVT since this morning.  Dr. Mortimer Fries gave order for echo.

## 2016-01-26 NOTE — Progress Notes (Signed)
Amio bolus finished and heart rate is 130's afib.  Amio drip infusing and RN monitoring patient. Patient is alert with confusion at times.

## 2016-01-26 NOTE — Progress Notes (Signed)
*  PRELIMINARY RESULTS* Echocardiogram 2D Echocardiogram has been performed.  [name] 01/26/2016, 9:50 PM

## 2016-01-26 NOTE — Consult Note (Signed)
[facility] [place] NAME: [name]    MR#:  [id number]  DATE OF BIRTH:  Jan 19, 1941  SUBJECTIVE: Patient is seen in ICU. Today he complains of shortness of breath. His pain. O2 sats 100%. On 2 L. Had SVT this morning.   CHIEF COMPLAINT:   Chief Complaint  Patient presents with  . Groin Pain    REVIEW OF SYSTEMS:   Review of Systems  Constitutional: Negative for fever, chills and weight loss.  HENT: Negative for hearing loss.   Eyes: Negative for blurred vision, double vision and photophobia.  Respiratory: Positive for shortness of breath. Negative for cough and hemoptysis.   Cardiovascular: Negative for chest pain, palpitations, orthopnea and leg swelling.  Gastrointestinal: Negative for vomiting, abdominal pain and diarrhea.  Genitourinary: Negative for dysuria and urgency.  Musculoskeletal: Negative for myalgias and neck pain.  Skin: Negative for rash.  Neurological: Negative for dizziness, focal weakness, seizures, weakness and headaches.  Endo/Heme/Allergies: Does not bruise/bleed easily.  Psychiatric/Behavioral: Negative for memory loss. The patient does not have insomnia.      DRUG ALLERGIES:   Allergies  Allergen Reactions  . Penicillins Hives, Itching and Swelling    Has patient had a PCN reaction causing immediate rash, facial/tongue/throat swelling, SOB or lightheadedness with hypotension: Yes Has patient had a PCN reaction causing severe rash involving mucus membranes or skin necrosis: No Has patient had a PCN reaction that required hospitalization No Has patient had a PCN reaction occurring within the last 10 years: No If all of the above answers are "NO", then may proceed with Cephalosporin use.    VITALS:  Blood pressure 103/48, pulse 85, temperature 97.7 F (36.5 C), temperature source Axillary, resp. rate 26, height 6\' 2"  (1.88 m), weight 85.095 kg (187 lb 9.6 oz), SpO2 97 %.  PHYSICAL  EXAMINATION:  GENERAL:  75 y.o.-year-old patient lying in the bed with no acute distress. ,markedly debilitated EYES: Pupils equal, round, reactive to light and accommodation. No scleral icterus. Extraocular muscles intact.  HEENT: Head atraumatic, normocephalic. Oropharynx and nasopharynx clear.  and is poor.  NECK:  Supple, no jugular venous distention. No thyroid enlargement, no tenderness.  LUNGS: Normal breath sounds bilaterally, no wheezing, rales,rhonchi or crepitation. No use of accessory muscles of respiration.  CARDIOVASCULAR: S1, S2 normal. No murmurs, rubs, or gallops.  ABDOMEN: Soft, nontender, nondistended. Bowel sounds present. No organomegaly or mass.  EXTREMITIES:  large l tender pulsatile mass present in the right groin.  NEUROLOGIC: Cranial nerves II through XII are intact. Muscle strength 5/5 in all extremities. Sensation intact. Gait not checked.  PSYCHIATRIC: The patient is alert and oriented x 3.  SKIN: No obvious rash, lesion, or ulcer.    LABORATORY PANEL:   CBC  Recent Labs Lab 01/26/16 0501  WBC 16.2*  HGB 7.0*  HCT 22.9*  PLT 117*   ------------------------------------------------------------------------------------------------------------------  Chemistries   Recent Labs Lab 01/26/16 0501  NA 137  K 4.3  CL 97*  CO2 30  GLUCOSE 92  BUN 20  CREATININE 3.34*  CALCIUM 7.8*  MG 1.7   ------------------------------------------------------------------------------------------------------------------  Cardiac Enzymes  Recent Labs Lab 01/25/16 1059  TROPONINI 0.04*   ------------------------------------------------------------------------------------------------------------------  RADIOLOGY:  Dg Chest Port 1 View  01/24/2016  CLINICAL DATA:  Bedside central venous catheter placement. EXAM: PORTABLE CHEST 1 VIEW 5:19 p.m.: COMPARISON:  Portable chest x-ray earlier today 5:08 a.m. and previously. FINDINGS: Right jugular central venous catheter  tip projects over the  lower SVC at or near the cavoatrial junction. No evidence of pneumothorax mediastinal hematoma. Cardiac silhouette moderately enlarged, unchanged. Pulmonary venous hypertension and mild interstitial pulmonary edema, unchanged. Bilateral pleural effusions, unchanged. Atelectasis involving the lower lobes, left greater than right, unchanged. No new pulmonary parenchymal abnormality. IMPRESSION: 1. Right jugular central venous catheter tip projects over the lower SVC at or near the cavoatrial junction. No acute complicating features. 2. Stable mild CHF, small bilateral pleural effusions and bilateral lower lobe atelectasis, left greater than right, since earlier today. No new abnormalities. Electronically Signed   By: Evangeline Dakin M.D.   On: 01/24/2016 17:50    EKG:   Orders placed or performed during the hospital encounter of 01/23/16  . EKG 12-Lead  . EKG 12-Lead  . EKG 12-Lead  . 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 Patient tolerated the procedure well. Seen in ICU for postop care.   #2 milky penile discharge. Seen  is seen by urology. This is likely due to debris  Collected  in the bladder of a patient who does not make urine, no intervention is indicated as per neurology recommendation.  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 #4 paroxysmal atrial fibrillation: Continue amiodarone. 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 #6 COPD: Oxygen dependent. He feels short of breath but he is not hypoxic, he is not wheezing.. Continue Pulmicort nebulizer, DuoNeb's, spiriva,followed by pulmonary, started on small dose steroid today.  #7 bilateral hydronephrosis, history of bladder cancer, history of metastatic prostate  cancer:    Reviewed med records,meds,noted from vascular,HD note,spoke to HD nurse Likely discharge to SNF tomorrow as per vascular note. All the records are reviewed and case discussed with Care Management/Social Workerr. Management plans discussed with the patient, family and they are in agreement.  CODE STATUS: full  TOTAL TIME TAKING CARE OF THIS PATIENT: 25  minutes.   POSSIBLE D/C IN 1-2DAYS, DEPENDING ON CLINICAL CONDITION.   Epifanio Lesches M.D on 01/26/2016 at 12:56 PM  Between 7am to 6pm - Pager - [phone]  After 6pm go to [email or web address] - password [email or web address] [facility]  Office  [phone]  CC: Primary care physician; Dion Body, MD   Note: This dictation was prepared with Dragon dictation along with smaller phrase technology. Any transcriptional errors that result from this process are unintentional.

## 2016-01-26 NOTE — Progress Notes (Signed)
RN made Dr. Mortimer Fries aware that patient had a 9 beat run of Vtach that went into a 26 second run of SVT and then back into NSR.  Dr. Mortimer Fries stated he would order an echo.

## 2016-01-27 DIAGNOSIS — I480 Paroxysmal atrial fibrillation: Secondary | ICD-10-CM | POA: Insufficient documentation

## 2016-01-27 DIAGNOSIS — I5042 Chronic combined systolic (congestive) and diastolic (congestive) heart failure: Secondary | ICD-10-CM | POA: Insufficient documentation

## 2016-01-27 LAB — PREPARE RBC (CROSSMATCH)

## 2016-01-27 LAB — CBC
HCT: 22.8 % — ABNORMAL LOW (ref 40.0–52.0)
Hemoglobin: 7 g/dL — ABNORMAL LOW (ref 13.0–18.0)
MCH: 26.3 pg (ref 26.0–34.0)
MCHC: 30.8 g/dL — AB (ref 32.0–36.0)
MCV: 85.3 fL (ref 80.0–100.0)
PLATELETS: 143 10*3/uL — AB (ref 150–440)
RBC: 2.68 MIL/uL — AB (ref 4.40–5.90)
RDW: 17.2 % — ABNORMAL HIGH (ref 11.5–14.5)
WBC: 21.3 10*3/uL — ABNORMAL HIGH (ref 3.8–10.6)

## 2016-01-27 LAB — ECHOCARDIOGRAM COMPLETE
HEIGHTINCHES: 74 in
WEIGHTICAEL: 3001.61 [oz_av]

## 2016-01-27 LAB — RPR: RPR Ser Ql: NONREACTIVE

## 2016-01-27 MED ORDER — SODIUM CHLORIDE 0.9 % IV SOLN: Freq: Once | INTRAVENOUS | Status: DC

## 2016-01-27 MED ORDER — IPRATROPIUM-ALBUTEROL 0.5-2.5 (3) MG/3ML IN SOLN
3.0000 mL | RESPIRATORY_TRACT | Status: DC | PRN
  Administered 2016-01-28 – 2016-01-29 (×2): 3 mL via RESPIRATORY_TRACT
  Filled 2016-01-27 (×3): qty 3

## 2016-01-27 MED ORDER — AMIODARONE HCL 200 MG PO TABS
400.0000 mg | ORAL_TABLET | Freq: Two times a day (BID) | ORAL | Status: DC
  Administered 2016-01-27 – 2016-01-29 (×5): 400 mg via ORAL
  Filled 2016-01-27 (×4): qty 2

## 2016-01-27 NOTE — Progress Notes (Signed)
Pre-hd tx 

## 2016-01-27 NOTE — Progress Notes (Signed)
Spoke with Dr. Mortimer Fries in person about order for patient to get a unit of packed red blood cells. Patient currently getting dialysis and MD confirmed that he wanted dialysis RN to give blood with dialysis if able (RN confirmed with dialysis RN and it will be fine). New type and screen order placed per MD order since old type and screen expired.

## 2016-01-27 NOTE — Progress Notes (Signed)
POST DIALYSIS RINSEBACK

## 2016-01-27 NOTE — Progress Notes (Signed)
Report called to [facility] on 2A. Patient to be moved to 249. Patient aware of transfer, declined any family or friends to be notified by staff. Medications sent through tube station to new floor.

## 2016-01-27 NOTE — Progress Notes (Signed)
[facility]  ROUNDING NOTE   Subjective:  Patient currently resting at bedside. He is due for hemodialysis today. Remains on amiodarone.   Objective:  Vital signs in last 24 hours:  Temp:  [97.6 F (36.4 C)-97.8 F (36.6 C)] 97.6 F (36.4 C) (05/06 0000) Pulse Rate:  [70-98] 70 (05/06 0600) Resp:  [16-31] 20 (05/06 0600) BP: (83-120)/(48-90) 101/53 mmHg (05/06 0600) SpO2:  [91 %-100 %] 100 % (05/06 0600)  Weight change:  Filed Weights   01/23/16 0904 01/23/16 2050 01/24/16 0700  Weight: 84.823 kg (187 lb) 85.095 kg (187 lb 9.6 oz) 85.095 kg (187 lb 9.6 oz)    Intake/Output: I/O last 3 completed shifts: In: 7143.3 [P.O.:210; I.V.:6933.3] Out: 0    Intake/Output this shift:     Physical Exam: General: NAD, resting in bed  Head: Normocephalic, atraumatic. Moist oral mucosal membranes  Eyes: Anicteric  Neck: Supple, trachea midline  Lungs:  Basilar rales, normal effort  Heart: S1S2 no rubs  Abdomen:  Soft, non tender, non distended  Extremities: trace peripheral edema.  Neurologic: Nonfocal, moving all four extremities  Skin: No lesions  Access: LUE AVF    Basic Metabolic Panel:  Recent Labs Lab 01/23/16 0909 01/24/16 0756 01/24/16 1058 01/26/16 0501  NA 140  --  140 137  K 4.5  --  4.0 4.3  CL 102  --  98* 97*  CO2 30  --  30 30  GLUCOSE 85  --  92 92  BUN 63*  --  31* 20  CREATININE 3.76*  --  2.05* 3.34*  CALCIUM 8.4*  --  8.4* 7.8*  MG  --   --   --  1.7  PHOS  --  4.3  --   --     Liver Function Tests: No results for input(s): AST, ALT, ALKPHOS, BILITOT, PROT, ALBUMIN in the last 168 hours. No results for input(s): LIPASE, AMYLASE in the last 168 hours. No results for input(s): AMMONIA in the last 168 hours.  CBC:  Recent Labs Lab 01/24/16 1058 01/26/16 0501 01/27/16 0449  WBC 7.4 16.2* 21.3*  HGB 9.0* 7.0* 7.0*  HCT 28.6* 22.9* 22.8*  MCV 85.2 86.6 85.3  PLT 210 117* 143*    Cardiac Enzymes:  Recent Labs Lab  01/24/16 2311 01/25/16 0504 01/25/16 1059  TROPONINI 0.03 0.03 0.04*    BNP: Invalid input(s): POCBNP  CBG:  Recent Labs Lab 01/24/16 1821 01/24/16 2031  GLUCAP 80 26    Microbiology: Results for orders placed or performed during the hospital encounter of 01/23/16  Culture, fungus without smear ([facility]-Only)     Status: None (Preliminary result)   Collection Time: 01/23/16 10:03 PM  Result Value Ref Range Status   Specimen Description CATH TIP  Final   Special Requests NONE  Final   Culture NO FUNGUS ISOLATED AFTER 3 DAYS  Final   Report Status PENDING  Incomplete  MRSA PCR Screening     Status: None   Collection Time: 01/24/16 12:02 AM  Result Value Ref Range Status   MRSA by PCR NEGATIVE NEGATIVE Final    Comment:        The GeneXpert MRSA Assay (FDA approved for NASAL specimens only), is one component of a comprehensive MRSA colonization surveillance program. It is not intended to diagnose MRSA infection nor to guide or monitor treatment for MRSA infections.     Coagulation Studies: No results for input(s): LABPROT, INR in the last 72 hours.  Urinalysis: No  results for input(s): COLORURINE, LABSPEC, [place], GLUCOSEU, HGBUR, BILIRUBINUR, KETONESUR, PROTEINUR, UROBILINOGEN, NITRITE, LEUKOCYTESUR in the last 72 hours.  Invalid input(s): APPERANCEUR    Imaging: No results found.   Medications:   . amiodarone 30 mg/hr (01/27/16 0600)  . DOPamine    . nitroGLYCERIN     . atorvastatin  40 mg Oral QHS  . budesonide (PULMICORT) nebulizer solution  0.5 mg Nebulization BID  . docusate sodium  100 mg Oral Daily  . epoetin (EPOGEN/PROCRIT) injection  10,000 Units Intravenous Q T,Th,Sa-HD  . famotidine (PEPCID) IV  20 mg Intravenous Q48H  . ferrous sulfate  325 mg Oral Daily  . guaiFENesin  600 mg Oral BID  . heparin  5,000 Units Subcutaneous Q8H  . ipratropium-albuterol  3 mL Nebulization Q4H  . magnesium oxide  400 mg Oral QPM  . methylPREDNISolone  (SOLU-MEDROL) injection  20 mg Intravenous Q12H  . mometasone-formoterol  2 puff Inhalation BID  . sevelamer carbonate  800 mg Oral TID AC  . tiotropium  18 mcg Inhalation Daily   sodium chloride, acetaminophen **OR** acetaminophen, albuterol, alum & mag hydroxide-simeth, guaiFENesin-dextromethorphan, hydrALAZINE, labetalol, loperamide, magnesium sulfate 1 - 4 g bolus IVPB, metoprolol, morphine injection, ondansetron, ondansetron, oxyCODONE, phenol, polyethylene glycol, potassium chloride, zolpidem  Assessment/ Plan:  75 y.o. 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  ESRD on HD TTHS: Patient due for hemodialysis today. We will prepare orders. Ultrafiltration target 1.5-2 kg.  2.  Anemia of CKD:  Hemoglobin down to 7. Consider blood transfusion. This could potentially be administered during dialysis. Defer to primary team. Continue Epogen with dialysis.  3.  SHPTH:  Phosphorus 4.3 and PTH 198. Continue to periodically monitor.  Continue Renvela.  4.  HTN:  Blood pressure 101/53. Continue amlodipine.  5.  AAA with type I endoleak and right common femoral aneurysm:  Patient with endovascular repair of abdominal aortic aneurysm as well as stent placement in the right common femoral artery. Management per vascular surgery.    LOS: 4 Branch Pacitti 5/6/20177:01 AM

## 2016-01-27 NOTE — Progress Notes (Signed)
POST DIALYSIS ASSESSMENT 

## 2016-01-27 NOTE — Consult Note (Signed)
Progress note   PATIENT NAME: [name]    MR#:  [id number]  DATE OF BIRTH:  Jul 09, 1941  CHIEF COMPLAINT:   Chief Complaint  Patient presents with  . Groin Pain   HPI  Patient alert and awake, follows commands No chest pain, SOB improved continue steroids and Aggressive BD therapy Will transition off amiodarone infusion Will transfuse one unit of blood  REVIEW OF SYSTEMS:   Review of Systems  Constitutional: Negative for fever, chills, weight loss and malaise/fatigue.  HENT: Negative for congestion.   Eyes: Negative for blurred vision.  Respiratory: Positive for shortness of breath and wheezing. Negative for cough.   Cardiovascular: Negative for chest pain and palpitations.  Genitourinary: Negative for dysuria.  Musculoskeletal: Negative for myalgias.  Skin: Negative for rash.  Neurological: Negative for dizziness and headaches.  Endo/Heme/Allergies: Does not bruise/bleed easily.   Physical Exam  Constitutional: He is oriented to person, place, and time. He appears distressed.  HENT:  Head: Normocephalic and atraumatic.  Eyes: Pupils are equal, round, and reactive to light.  Neck: Neck supple.  Cardiovascular: Normal rate and regular rhythm.   No murmur heard. Pulmonary/Chest: He is in respiratory distress. He has wheezes.  Abdominal: Soft.  Musculoskeletal: Normal range of motion.  Neurological: He is alert and oriented to person, place, and time. No cranial nerve deficit.  Skin: Skin is warm. He is not diaphoretic. No erythema.       DRUG ALLERGIES:   Allergies  Allergen Reactions  . Penicillins Hives, Itching and Swelling    Has patient had a PCN reaction causing immediate rash, facial/tongue/throat swelling, SOB or lightheadedness with hypotension: Yes Has patient had a PCN reaction causing severe rash involving mucus membranes or skin necrosis: No Has patient had a PCN reaction that required hospitalization No Has patient had a PCN reaction  occurring within the last 10 years: No If all of the above answers are "NO", then may proceed with Cephalosporin use.    VITALS:  Blood pressure 109/63, pulse 68, temperature 97.6 F (36.4 C), temperature source Oral, resp. rate 18, height 6\' 2"  (1.88 m), weight 182 lb 12.2 oz (82.9 kg), SpO2 100 %.     LABORATORY PANEL:   CBC  Recent Labs Lab 01/27/16 0449  WBC 21.3*  HGB 7.0*  HCT 22.8*  PLT 143*   ------------------------------------------------------------------------------------------------------------------  Chemistries   Recent Labs Lab 01/26/16 0501  NA 137  K 4.3  CL 97*  CO2 30  GLUCOSE 92  BUN 20  CREATININE 3.34*  CALCIUM 7.8*  MG 1.7   ------------------------------------------------------------------------------------------------------------------  Cardiac Enzymes  Recent Labs Lab 01/25/16 1059  TROPONINI 0.04*   ------------------------------------------------------------------------------------------------------------------  RADIOLOGY:  No results found.  EKG:   Orders placed or performed during the hospital encounter of 01/23/16  . EKG 12-Lead  . EKG 12-Lead  . EKG 12-Lead  . EKG 12-Lead    ASSESSMENT AND PLAN:  75 yo AAM with  symptomatic common femoral artery aneurysm  seen by vascular and taken to surgery today. H/o  secondary to cardiac arrhythmia, ESRD, COPD.   1.optimize cardiac meds per cardiology 2.COPD exec regimen-started solumedrol 20mg  bid, start dounebs every 4 hrs, Pulmicort nebs 3.follow up vasc surgery recs 4.ESRD on HD-hd as needed 5.follow up cards consult-wean off amiodarone 6.DVT prx 7.transfuse one unit of PRBC's hgb at 7     The Patient requires high complexity decision making for assessment and support, frequent evaluation and titration of therapies.  Patient  satisfied with Plan  of action and management. All questions answered  Corrin Parker, M.D.  [facility]   Medical Director [place] Director [facility] Cardio-Pulmonary Department

## 2016-01-27 NOTE — Progress Notes (Signed)
This note also relates to the following rows which could not be included: Pulse Rate - Cannot attach notes to unvalidated device data Resp - Cannot attach notes to unvalidated device data BP - Cannot attach notes to unvalidated device data   POST DIALYSIS VITALS

## 2016-01-27 NOTE — Progress Notes (Signed)
Hemodialysis treatment start 

## 2016-01-27 NOTE — Plan of Care (Signed)
Problem: Education: Goal: Knowledge of [facility] Education information/materials will improve Outcome: Progressing Patient is off IV amiodarone. Tolerated hemodialysis well. Currently in NSR. Will be transferred to 2A.  Problem: Pain Managment: Goal: General experience of comfort will improve Outcome: Progressing Patient currently not complaining of any pain. Repositioning has been effective in relieving discomfort.

## 2016-01-27 NOTE — Progress Notes (Signed)
This note also relates to the following rows which could not be included: Pulse Rate - Cannot attach notes to unvalidated device data Resp - Cannot attach notes to unvalidated device data BP - Cannot attach notes to unvalidated device data SpO2 - Cannot attach notes to unvalidated device data   Blood transfusion started

## 2016-01-27 NOTE — Consult Note (Signed)
[place] [facility] [place] NAME: [name]    MR#:  [id number]  DATE OF BIRTH:  July 28, 1941  SUBJECTIVE: Patient is seen in [place] any complaints, started on amiodarone drip. Getting hemodialysis.   CHIEF COMPLAINT:   Chief Complaint  Patient presents with  . Groin Pain    REVIEW OF SYSTEMS:   Review of Systems  Constitutional: Negative for fever, chills and weight loss.  HENT: Negative for hearing loss.   Eyes: Negative for blurred vision, double vision and photophobia.  Respiratory: Negative for cough, hemoptysis and shortness of breath.   Cardiovascular: Negative for chest pain, palpitations, orthopnea and leg swelling.  Gastrointestinal: Negative for vomiting, abdominal pain and diarrhea.  Genitourinary: Negative for dysuria and urgency.  Musculoskeletal: Negative for myalgias and neck pain.  Skin: Negative for rash.  Neurological: Negative for dizziness, focal weakness, seizures, weakness and headaches.  Endo/Heme/Allergies: Does not bruise/bleed easily.  Psychiatric/Behavioral: Negative for memory loss. The patient does not have insomnia.      DRUG ALLERGIES:   Allergies  Allergen Reactions  . Penicillins Hives, Itching and Swelling    Has patient had a PCN reaction causing immediate rash, facial/tongue/throat swelling, SOB or lightheadedness with hypotension: Yes Has patient had a PCN reaction causing severe rash involving mucus membranes or skin necrosis: No Has patient had a PCN reaction that required hospitalization No Has patient had a PCN reaction occurring within the last 10 years: No If all of the above answers are "NO", then may proceed with Cephalosporin use.    VITALS:  Blood pressure 100/56, pulse 70, temperature 97.4 F (36.3 C), temperature source Axillary, resp. rate 17, height 6\' 2"  (1.88 m), weight 82.9 kg (182 lb 12.2 oz), SpO2 93 %.  PHYSICAL EXAMINATION:  GENERAL:  75 y.o.-year-old  patient lying in the bed with no acute distress. ,markedly debilitated EYES: Pupils equal, round, reactive to light and accommodation. No scleral icterus. Extraocular muscles intact.  HEENT: Head atraumatic, normocephalic. Oropharynx and nasopharynx clear.  and is poor.  NECK:  Supple, no jugular venous distention. No thyroid enlargement, no tenderness.  LUNGS: Normal breath sounds bilaterally, no wheezing, rales,rhonchi or crepitation. No use of accessory muscles of respiration.  CARDIOVASCULAR: S1, S2 normal. No murmurs, rubs, or gallops.  ABDOMEN: Soft, nontender, nondistended. Bowel sounds present. No organomegaly or mass.  EXTREMITIES:  large l tender pulsatile mass present in the right groin.  NEUROLOGIC: Cranial nerves II through XII are intact. Muscle strength 5/5 in all extremities. Sensation intact. Gait not checked.  PSYCHIATRIC: The patient is alert and oriented x 3.  SKIN: No obvious rash, lesion, or ulcer.    LABORATORY PANEL:   CBC  Recent Labs Lab 01/27/16 0449  WBC 21.3*  HGB 7.0*  HCT 22.8*  PLT 143*   ------------------------------------------------------------------------------------------------------------------  Chemistries   Recent Labs Lab 01/26/16 0501  NA 137  K 4.3  CL 97*  CO2 30  GLUCOSE 92  BUN 20  CREATININE 3.34*  CALCIUM 7.8*  MG 1.7   ------------------------------------------------------------------------------------------------------------------  Cardiac Enzymes  Recent Labs Lab 01/25/16 1059  TROPONINI 0.04*   ------------------------------------------------------------------------------------------------------------------  RADIOLOGY:  No results found.  EKG:   Orders placed or performed during the hospital encounter of 01/23/16  . EKG 12-Lead  . EKG 12-Lead  . EKG 12-Lead  . EKG 12-Lead  . EKG 12-Lead  . 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 Patient tolerated the procedure well. Seen in ICU for postop care.   #2 milky penile discharge. Seen  is seen by urology. This is likely due to debris  Collected  in the bladder of a patient who does not make urine, no intervention is indicated as per neurology recommendation.  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 #4 . Atrial fibrillation with RVR: Started on amiodarone drip  yesterday cardiology . Rate is in 28s. Can probably transition to by mouth amiodarone.  Patient has acute on chronic anemia, not on full  anticoagulation due to recent surgery.with AAA ,femoral aneurysm repair..   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 #6 COPD: Oxygen dependent. He feels short of breath but he is not hypoxic, he is not wheezing.. Continue Pulmicort nebulizer, DuoNeb's, spiriva,followed by pulmonary, started on small dose steroid today.  #7 bilateral hydronephrosis, history of bladder cancer, history of metastatic prostate cancer:    Reviewed med records,meds,noted from vascular,HD note,spoke to HD nurse Likely discharge to SNF tomorrow as per vascular note. All the records are reviewed and case discussed with Care Management/Social Workerr. Management plans discussed with the patient, family and they are in agreement.  CODE STATUS: full  TOTAL TIME TAKING CARE OF THIS PATIENT: 25  minutes.   POSSIBLE D/C IN 1-2DAYS, DEPENDING ON CLINICAL CONDITION.   Epifanio Lesches M.D on 01/27/2016 at 10:02 AM  Between 7am to 6pm - Pager - [phone]  After 6pm go to [email or web address] - password [email or web address] [facility]  Office  [phone]  CC: Primary care physician; Dion Body, MD   Note: This dictation was prepared with Dragon dictation along with smaller phrase technology. Any  transcriptional errors that result from this process are unintentional.

## 2016-01-27 NOTE — Progress Notes (Signed)
3 Days Post-Op  Subjective:  Doing OK. No events overnight. Remains on Amio gtt but now rate controlled. Tol HD. Denies pain.  Objective: Vital signs in last 24 hours: Temp:  [97.4 F (36.3 C)-97.8 F (36.6 C)] 97.4 F (36.3 C) (05/06 0830) Pulse Rate:  [66-98] 70 (05/06 1015) Resp:  [15-31] 21 (05/06 1015) BP: (83-120)/(48-90) 112/62 mmHg (05/06 1015) SpO2:  [91 %-100 %] 98 % (05/06 1015) Weight:  [82.9 kg (182 lb 12.2 oz)] 82.9 kg (182 lb 12.2 oz) (05/06 0830) Last BM Date: 01/23/16  Intake/Output from previous day: 05/05 0701 - 05/06 0700 In: 7143.3 [P.O.:210; I.V.:6933.3] Out: 0  Intake/Output this shift: Total I/O In: 1700.3 [I.V.:1650.3; IV Piggyback:50] Out: -    Gen: Alert, NAD CV: RR ABD: soft, NT/ND Ext/Vasc: warm, incision -C/D/I, pseudoaneurysm stable-firm, nonpulsatile, +pedal pulses  Lab Results:   Recent Labs  01/26/16 0501 01/27/16 0449  WBC 16.2* 21.3*  HGB 7.0* 7.0*  HCT 22.9* 22.8*  PLT 117* 143*   BMET  Recent Labs  01/24/16 1058 01/26/16 0501  NA 140 137  K 4.0 4.3  CL 98* 97*  CO2 30 30  GLUCOSE 92 92  BUN 31* 20  CREATININE 2.05* 3.34*  CALCIUM 8.4* 7.8*   PT/INR No results for input(s): LABPROT, INR in the last 72 hours. ABG No results for input(s): PHART, HCO3 in the last 72 hours.  Invalid input(s): PCO2, PO2  Studies/Results: No results found.  Anti-infectives: Anti-infectives    Start     Dose/Rate Route Frequency Ordered Stop   01/24/16 2200  clindamycin (CLEOCIN) IVPB 300 mg     300 mg 100 mL/hr over 30 Minutes Intravenous Every 8 hours 01/24/16 1814 01/25/16 1500   01/24/16 1815  cefUROXime (ZINACEF) 1.5 g in dextrose 5 % 50 mL IVPB     1.5 g 100 mL/hr over 30 Minutes Intravenous Every 12 hours 01/24/16 1814 01/25/16 0546   01/24/16 1815  vancomycin (VANCOCIN) IVPB 1000 mg/200 mL premix     1,000 mg 200 mL/hr over 60 Minutes Intravenous Every 12 hours 01/24/16 1814 01/25/16 0615   01/24/16 1100  clindamycin  (CLEOCIN) IVPB 900 mg  Status:  Discontinued     900 mg 100 mL/hr over 30 Minutes Intravenous  Once 01/24/16 1046 01/24/16 1046   01/24/16 1100  clindamycin (CLEOCIN) IVPB 300 mg     300 mg 100 mL/hr over 30 Minutes Intravenous  Once 01/24/16 1046 01/24/16 1901   01/24/16 0000  vancomycin (VANCOCIN) IVPB 1000 mg/200 mL premix  Status:  Discontinued     1,000 mg 200 mL/hr over 60 Minutes Intravenous To Surgery 01/23/16 1924 01/24/16 1814      Assessment/Plan: s/p Procedure(s): Endovascular Repair/Stent Graft (N/A) Right Femoral pseudoaneurysm repair, Suprarenal AAA repair  LOS: 4 days   Appreciate consultant input Wean Amio gtt per cards, Transfuse pRBC Continue medical support per Medicine/Intensivist  Esco, Miechia A 01/27/2016

## 2016-01-27 NOTE — Progress Notes (Signed)
Spoke to Dr. Vianne Bulls on the phone concerning patients status after being taken off amiodarone infusion per cardiology. PO amiodarone ordered. Patient currently in NSR. Patient also received hemodialysis this morning and tolerated it well. Verbal order to transfer patient to 2A. Also received order to dc nitro and dopamine drips that were currently not in use.

## 2016-01-27 NOTE — Progress Notes (Signed)
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[place])   ESRD on dialysis ([place])   Paroxysmal Afib, SR now Hx of Chronic CHF,  Combines, EF normal  On last echo June 2016  PATIENT SUMMARY  75 year old gentleman known to our clinic with history of systolic and diastolic CHF, paroxysmal atrial fibrillation, on amiodarone, end-stage renal disease on hemodialysis, 8 cm femoral aneurysm, interval enlargement of AAA, status post repair 01/24/2016, was ready to be discharged home  when he developed acute onset of atrial fibrillation with RVR, started on amiodarone infusion, converting to normal sinus rhythm approximately 2 hours later  SUBJECTIVE  Patient denies CP, SOB. No complaints.  Remained in SR. Hgb still very low, will be transfusing prbc Off of Dopamine   CURRENT MEDS . sodium chloride   Intravenous Once  . atorvastatin  40 mg Oral QHS  . budesonide (PULMICORT) nebulizer solution  0.5 mg Nebulization BID  . docusate sodium  100 mg Oral Daily  . epoetin (EPOGEN/PROCRIT) injection  10,000 Units Intravenous Q T,Th,Sa-HD  . famotidine (PEPCID) IV  20 mg Intravenous Q48H  . ferrous sulfate  325 mg Oral Daily  . guaiFENesin  600 mg Oral BID  . heparin  5,000 Units Subcutaneous Q8H  . ipratropium-albuterol  3 mL Nebulization Q4H  . magnesium oxide  400 mg Oral QPM  . methylPREDNISolone (SOLU-MEDROL) injection  20 mg Intravenous Q12H  . mometasone-formoterol  2 puff Inhalation BID  . sevelamer carbonate  800 mg Oral TID AC  . tiotropium  18 mcg Inhalation Daily    OBJECTIVE  Filed Vitals:   01/27/16 0830 01/27/16 0900  01/27/16 0915 01/27/16 0930  BP: 106/61 109/63 109/67 104/59  Pulse: 67 68 70 69  Temp:      TempSrc:      Resp: 17 18 17 15   Height:      Weight: 182 lb 12.2 oz (82.9 kg)     SpO2: 100% 100% 100% 100%    Intake/Output Summary (Last 24 hours) at 01/27/16 0932 Last data filed at 01/27/16 0600  Gross per 24 hour  Intake 7143.25 ml  Output      0 ml  Net 7143.25 ml   Filed Weights   01/23/16 2050 01/24/16 0700 01/27/16 0830  Weight: 187 lb 9.6 oz (85.095 kg) 187 lb 9.6 oz (85.095 kg) 182 lb 12.2 oz (82.9 kg)    PHYSICAL EXAM   PHYSICAL EXAM: VS:  BP 104/59 mmHg  Pulse 69  Temp(Src) 97.4 F (36.3 C) (Axillary)  Resp 15  Ht 6\' 2"  (1.88 m)  Wt 182 lb 12.2 oz (82.9 kg)  BMI 23.46 kg/m2  SpO2 100% , BMI Body mass index is 23.46 kg/(m^2). GENERAL:  well developed, well nourished, not in acute distress HEENT: normocephalic, pink conjunctivae, anicteric sclerae, no xanthelasma, poor dentition, oropharynx clear NECK:  no neck vein engorgement, JVP normal, no hepatojugular reflux, carotid upstroke brisk and symmetric, no bruit, no thyromegaly, no lymphadenopathy LUNGS:  good respiratory effort, clear to auscultation bilaterally CV:  PMI not displaced, no thrills, no lifts, S1 and S2 within normal limits, no palpable S3 or S4, no murmurs, no rubs, no gallops  ABD:  Soft, nontender, nondistended, normoactive bowel sounds, no abdominal aortic bruit, no hepatomegaly, no splenomegaly MS: nontender back, no kyphosis, no scoliosis, no joint deformities EXT:  2+ DP/PT pulses, no edema, no varicosities, no cyanosis, no clubbing SKIN: warm, nondiaphoretic, normal turgor, no ulcers NEUROPSYCH: alert, oriented to person, place, and time, sensory/motor grossly intact, normal mood, appropriate affect    Accessory Clinical Findings  CBC  Recent Labs  01/26/16 0501 01/27/16 0449  WBC 16.2* 21.3*  HGB 7.0* 7.0*  HCT 22.9* 22.8*  MCV 86.6 85.3  PLT 117* [id number]*   Basic Metabolic  Panel  Recent Labs  01/24/16 1058 01/26/16 0501  NA 140 137  K 4.0 4.3  CL 98* 97*  CO2 30 30  GLUCOSE 92 92  BUN 31* 20  CREATININE 2.05* 3.34*  CALCIUM 8.4* 7.8*  MG  --  1.7   Liver Function Tests No results for input(s): AST, ALT, ALKPHOS, BILITOT, PROT, ALBUMIN in the last 72 hours. No results for input(s): LIPASE, AMYLASE in the last 72 hours. Cardiac Enzymes  Recent Labs  01/24/16 2311 01/25/16 0504 01/25/16 1059  TROPONINI 0.03 0.03 0.04*   BNP (last 3 results) No results for input(s): BNP in the last 8760 hours. D-Dimer No results for input(s): DDIMER in the last 72 hours. Hemoglobin A1C No results for input(s): HGBA1C in the last 72 hours. Fasting Lipid Panel No results for input(s): CHOL, HDL, LDLCALC, TRIG, CHOLHDL, LDLDIRECT in the last 72 hours. Thyroid Function Tests No results for input(s): TSH, T4TOTAL, T3FREE, THYROIDAB in the last 72 hours.  Invalid input(s): FREET3  TELE  SR on tele  ECG  EKG 01/24/2016: SR with PVCs ordered EKG for today  ASSESSMENT AND PLAN   PAF with RVR/SVT, now in SR Prior history of PAF Numerous stressors including profound anemia, recent surgery Deemed Not a good candidate for anticoagulation as he is on hemodialysis/also significant anemia Amiodarone IV loading to be completed around 2pm today. Per discussion yesterday/iniitial consultation, recommend transition to 400 mg twice a day by mouth (5 day load) followed by 200 mg twice a day.  Check EKG today  Chronic combined CHF Last echocardiogram June 2016 with normal ejection fraction Fluid status management per renal/hemodialysis He does not appear to be volume overloaded at this time and is laying supine without issues  Status post endovascular repair of AAA and femoral aneurysm Per vascular Optimal BP and lipids are advised   Hypotension Resolved  ESRD on HD Per renal  Acute on chronic anemia Management per primary team Recommend to transfuse to  HGB of >8.5 Has been started on EPO per renal   Total encounter time was 25 minutes or greater Greater than 50% was spent in counseling and coordination of care with patient    Signed, Wende Bushy, MD  01/27/2016, 9:32 AM

## 2016-01-28 DIAGNOSIS — D62 Acute posthemorrhagic anemia: Secondary | ICD-10-CM | POA: Insufficient documentation

## 2016-01-28 LAB — TYPE AND SCREEN
ABO/RH(D): A NEG
Antibody Screen: NEGATIVE
UNIT DIVISION: 0

## 2016-01-28 NOTE — Progress Notes (Signed)
4 Days Post-Op  Subjective: Doing OK. Without complaints. NSR. No events overnight.  Objective: Vital signs in last 24 hours: Temp:  [97.3 F (36.3 C)-98 F (36.7 C)] 97.7 F (36.5 C) (05/07 0542) Pulse Rate:  [68-78] 72 (05/07 0542) Resp:  [12-22] 18 (05/07 0542) BP: (84-120)/(49-72) 114/61 mmHg (05/07 0542) SpO2:  [92 %-100 %] 92 % (05/07 0731) Last BM Date: 01/23/16  Intake/Output from previous day: 05/06 0701 - 05/07 0700 In: 4032.4 [P.O.:120; I.V.:3862.4; IV Piggyback:50] Out: 1864  Intake/Output this shift:   Gen: Alert, NAD CV: RR ABD: soft, NT/ND Ext/Vasc: warm, incision -C/D/I, pseudoaneurysm stable-firm, nonpulsatile, +pedal pulses   Lab Results:   Recent Labs  01/26/16 0501 01/27/16 0449  WBC 16.2* 21.3*  HGB 7.0* 7.0*  HCT 22.9* 22.8*  PLT 117* 143*   BMET  Recent Labs  01/26/16 0501  NA 137  K 4.3  CL 97*  CO2 30  GLUCOSE 92  BUN 20  CREATININE 3.34*  CALCIUM 7.8*   PT/INR No results for input(s): LABPROT, INR in the last 72 hours. ABG No results for input(s): PHART, HCO3 in the last 72 hours.  Invalid input(s): PCO2, PO2  Studies/Results: No results found.  Anti-infectives: Anti-infectives    Start     Dose/Rate Route Frequency Ordered Stop   01/24/16 2200  clindamycin (CLEOCIN) IVPB 300 mg     300 mg 100 mL/hr over 30 Minutes Intravenous Every 8 hours 01/24/16 1814 01/25/16 1500   01/24/16 1815  cefUROXime (ZINACEF) 1.5 g in dextrose 5 % 50 mL IVPB     1.5 g 100 mL/hr over 30 Minutes Intravenous Every 12 hours 01/24/16 1814 01/25/16 0546   01/24/16 1815  vancomycin (VANCOCIN) IVPB 1000 mg/200 mL premix     1,000 mg 200 mL/hr over 60 Minutes Intravenous Every 12 hours 01/24/16 1814 01/25/16 0615   01/24/16 1100  clindamycin (CLEOCIN) IVPB 900 mg  Status:  Discontinued     900 mg 100 mL/hr over 30 Minutes Intravenous  Once 01/24/16 1046 01/24/16 1046   01/24/16 1100  clindamycin (CLEOCIN) IVPB 300 mg     300 mg 100 mL/hr  over 30 Minutes Intravenous  Once 01/24/16 1046 01/24/16 1901   01/24/16 0000  vancomycin (VANCOCIN) IVPB 1000 mg/200 mL premix  Status:  Discontinued     1,000 mg 200 mL/hr over 60 Minutes Intravenous To Surgery 01/23/16 1924 01/24/16 1814      Assessment/Plan: s/p Procedure(s): Endovascular Repair/Stent Graft (N/A) Right Femoral pseudoaneurysm repair, Suprarenal AAA repair   Appreciate consultant input Now in NSR on PO regimen Otherwise stable. Will plan for D/C to nursing facility tomorrow   LOS: 5 days    Evaristo Bury 01/28/2016

## 2016-01-28 NOTE — Progress Notes (Signed)
Patient Name: [name] Date of Encounter: 01/28/2016  Patient Care Team: Dion Body, MD as PCP - General (Family Medicine) Alisa Graff, FNP as Nurse Practitioner (Cardiology) Lloyd Huger, MD as Consulting Physician (Oncology)  PROBLEM LIST  Active Problems:   Suprarenal aortic aneurysm [facility])   COPD (chronic obstructive pulmonary disease) ([place])   Pseudoaneurysm of iliac artery ([place])   ESRD on dialysis ([place])   Paroxysmal atrial fibrillation ([place])   Chronic combined systolic and diastolic congestive heart failure [facility])    PATIENT SUMMARY  75 year old gentleman known to our clinic with history of systolic and diastolic CHF, paroxysmal atrial fibrillation, on amiodarone, end-stage renal disease on hemodialysis, 8 cm femoral aneurysm, interval enlargement of AAA, status post repair 01/24/2016, was ready to be discharged home  when he developed acute onset of atrial fibrillation with RVR, started on amiodarone infusion, converting to normal sinus rhythm approximately 2 hours later  SUBJECTIVE Patient more alert today.  Overall, he feels well. Patient denies CP, SOB.  Remained in SR. Hgb still very low Off of Dopamine, amiodarone IV loading finished yesterday at around 2 PM. Currently on amiodarone by mouth.   CURRENT MEDS . amiodarone  400 mg Oral BID  . atorvastatin  40 mg Oral QHS  . budesonide (PULMICORT) nebulizer solution  0.5 mg Nebulization BID  . docusate sodium  100 mg Oral Daily  . epoetin (EPOGEN/PROCRIT) injection  10,000 Units Intravenous Q T,Th,Sa-HD  . famotidine (PEPCID) IV  20 mg Intravenous Q48H  . ferrous sulfate  325 mg Oral Daily  . guaiFENesin  600 mg Oral BID  . heparin  5,000 Units Subcutaneous Q8H  . magnesium oxide  400 mg Oral QPM  . methylPREDNISolone (SOLU-MEDROL) injection  20 mg Intravenous Q12H  . mometasone-formoterol  2 puff Inhalation BID  . sevelamer carbonate  800 mg Oral TID AC  . tiotropium  18 mcg Inhalation  Daily    OBJECTIVE  Filed Vitals:   01/27/16 1953 01/27/16 2352 01/28/16 0542 01/28/16 0731  BP: 106/66  114/61   Pulse: 78  72   Temp: 97.5 F (36.4 C) 98 F (36.7 C) 97.7 F (36.5 C)   TempSrc: Oral  Oral   Resp:   18   Height:      Weight:      SpO2: 97%  100% 92%    Intake/Output Summary (Last 24 hours) at 01/28/16 0914 Last data filed at 01/27/16 1414  Gross per 24 hour  Intake 2882.17 ml  Output   1864 ml  Net 1018.17 ml   Filed Weights   01/23/16 2050 01/24/16 0700 01/27/16 0830  Weight: 187 lb 9.6 oz (85.095 kg) 187 lb 9.6 oz (85.095 kg) 182 lb 12.2 oz (82.9 kg)    PHYSICAL EXAM   PHYSICAL EXAM: VS:  BP 114/61 mmHg  Pulse 72  Temp(Src) 97.7 F (36.5 C) (Oral)  Resp 18  Ht 6\' 2"  (1.88 m)  Wt 182 lb 12.2 oz (82.9 kg)  BMI 23.46 kg/m2  SpO2 92% , BMI Body mass index is 23.46 kg/(m^2). GENERAL:  well developed, well nourished, not in acute distress HEENT: normocephalic, pink conjunctivae, anicteric sclerae, no xanthelasma, poor dentition, oropharynx clear NECK:  no neck vein engorgement, JVP normal, no hepatojugular reflux, carotid upstroke brisk and symmetric, no bruit, no thyromegaly, no lymphadenopathy LUNGS:  good respiratory effort, clear to auscultation bilaterally CV:  PMI not displaced, no thrills, no lifts, S1 and S2 within normal limits, no palpable S3  or S4, no murmurs, no rubs, no gallops ABD:  Soft, nontender, nondistended, normoactive bowel sounds, no abdominal aortic bruit, no hepatomegaly, no splenomegaly MS: nontender back, no kyphosis, no scoliosis, no joint deformities EXT:  2+ DP/PT pulses, no edema, no varicosities, no cyanosis, no clubbing SKIN: warm, nondiaphoretic, normal turgor, no ulcers NEUROPSYCH: alert, oriented to person, place, and time, sensory/motor grossly intact, normal mood, appropriate affect   Accessory Clinical Findings  CBC  Recent Labs  01/26/16 0501 01/27/16 0449  WBC 16.2* 21.3*  HGB 7.0* 7.0*  HCT 22.9*  22.8*  MCV 86.6 85.3  PLT 117* [id number]*   Basic Metabolic Panel  Recent Labs  01/26/16 0501  NA 137  K 4.3  CL 97*  CO2 30  GLUCOSE 92  BUN 20  CREATININE 3.34*  CALCIUM 7.8*  MG 1.7   Liver Function Tests No results for input(s): AST, ALT, ALKPHOS, BILITOT, PROT, ALBUMIN in the last 72 hours. No results for input(s): LIPASE, AMYLASE in the last 72 hours. Cardiac Enzymes  Recent Labs  01/25/16 1059  TROPONINI 0.04*   BNP (last 3 results) No results for input(s): BNP in the last 8760 hours. D-Dimer No results for input(s): DDIMER in the last 72 hours. Hemoglobin A1C No results for input(s): HGBA1C in the last 72 hours. Fasting Lipid Panel No results for input(s): CHOL, HDL, LDLCALC, TRIG, CHOLHDL, LDLDIRECT in the last 72 hours. Thyroid Function Tests No results for input(s): TSH, T4TOTAL, T3FREE, THYROIDAB in the last 72 hours.  Invalid input(s): FREET3  TELE  SR on tele  ECG  EKG 01/24/2016: SR with PVCs EKG from 01/27/2016 was personally reviewed by me. It revealed sinus rhythm with occasional PVCs. Normal QT.   ECHO 01/26/2016 Left ventricle: The cavity size was mildly dilated. There was  mild focal basal hypertrophy of the septum. Systolic function was  moderately reduced. The estimated ejection fraction was in the  range of 35% to 40%. Diffuse hypokinesis with more pronounced  hypokinesis of the inferior wall. - Aortic valve: There was mild regurgitation. - Mitral valve: Moderately calcified annulus. - Left atrium: The atrium was moderately dilated. - Right atrium: The atrium was moderately dilated.  ASSESSMENT AND PLAN  PAF with RVR/SVT, now in SR Numerous stressors including profound anemia, recent surgery Deemed not a good candidate for anticoagulation as he is on hemodialysis/also significant anemia Amiodarone IV loading completed around 2pm 01/27/2016. Per discussion on iniitial consultation, recommend transition to 400 mg twice a day by mouth (5  day load) - to finish 5/11 am dose. Followed by 200 mg twice a day.   Chronic combined systolic and distolic CHF, does not appear to be in decompensation Last echocardiogram June 2016 with normal ejection fraction Echocardiogram from 01/26/2016 revealed an ejection fraction of 35-40% with diffuse hypokinesis and pronounced hypokinesis of the inferior wall. Patient has had issues with systolic dysfunction in the past with an EF of 45-50% from echo 01/27/2015, and 25-30% from April 2016. Fluid status management per renal/hemodialysis He does not appear to be volume overloaded at this time and is laying supine without issues In terms of further evaluation of systolic dysfunction/ischemia evaluation, we will have to defer at a later time when patient is more stable from post endovascular AAA repair standpoint as well as anemia standpoint. He does not have any chest pain/angina at this point. Patient verbalized understanding and agreed with plan. Would continue to monitor and likely will resume some beta-blockade once blood pressure has been significantly stable.  Status post endovascular repair of AAA and femoral aneurysm Per vascular Optimal BP and lipids are advised   Hypotension Resolved  ESRD on HD Per renal  Acute on chronic anemia H&H still significantly low Management per primary team Recommend to transfuse to HGB of >8.5 Has been started on EPO per renal  Aortic insufficiency  Mild, stable from previous echo Serial evaluation   Total encounter time was 35 minutes or greater Greater than 50% was spent in counseling and coordination of care with patient    Signed, Wende Bushy, MD  01/28/2016, 9:14 AM

## 2016-01-28 NOTE — Progress Notes (Signed)
[facility]  ROUNDING NOTE   Subjective:  Patient completed hemodialysis yesterday. He was transitioned to floor care. Hemoglobin currently 7.0. Overall slowly feeling better.   Objective:  Vital signs in last 24 hours:  Temp:  [97.5 F (36.4 C)-98 F (36.7 C)] 97.7 F (36.5 C) (05/07 0542) Pulse Rate:  [71-78] 72 (05/07 0542) Resp:  [12-22] 18 (05/07 0542) BP: (104-120)/(55-72) 114/61 mmHg (05/07 0542) SpO2:  [92 %-100 %] 92 % (05/07 0731)  Weight change:  Filed Weights   01/23/16 2050 01/24/16 0700 01/27/16 0830  Weight: 85.095 kg (187 lb 9.6 oz) 85.095 kg (187 lb 9.6 oz) 82.9 kg (182 lb 12.2 oz)    Intake/Output: I/O last 3 completed shifts: In: 10832.4 [P.O.:120; I.V.:10662.4; IV Piggyback:50] Out: [id number] [Other:1864]   Intake/Output this shift:  Total I/O In: 360 [P.O.:360] Out: -   Physical Exam: General: NAD, resting in bed  Head: Normocephalic, atraumatic. Moist oral mucosal membranes  Eyes: Anicteric  Neck: Supple, trachea midline  Lungs:  CTAB, normal effort  Heart: S1S2 no rubs  Abdomen:  Soft, non tender, non distended  Extremities: trace peripheral edema.  Neurologic: Nonfocal, moving all four extremities  Skin: No lesions  Access: LUE AVF    Basic Metabolic Panel:  Recent Labs Lab 01/23/16 0909 01/24/16 0756 01/24/16 1058 01/26/16 0501  NA 140  --  140 137  K 4.5  --  4.0 4.3  CL 102  --  98* 97*  CO2 30  --  30 30  GLUCOSE 85  --  92 92  BUN 63*  --  31* 20  CREATININE 3.76*  --  2.05* 3.34*  CALCIUM 8.4*  --  8.4* 7.8*  MG  --   --   --  1.7  PHOS  --  4.3  --   --     Liver Function Tests: No results for input(s): AST, ALT, ALKPHOS, BILITOT, PROT, ALBUMIN in the last 168 hours. No results for input(s): LIPASE, AMYLASE in the last 168 hours. No results for input(s): AMMONIA in the last 168 hours.  CBC:  Recent Labs Lab 01/24/16 1058 01/26/16 0501 01/27/16 0449  WBC 7.4 16.2* 21.3*  HGB 9.0* 7.0* 7.0*  HCT  28.6* 22.9* 22.8*  MCV 85.2 86.6 85.3  PLT 210 117* 143*    Cardiac Enzymes:  Recent Labs Lab 01/24/16 2311 01/25/16 0504 01/25/16 1059  TROPONINI 0.03 0.03 0.04*    BNP: Invalid input(s): POCBNP  CBG:  Recent Labs Lab 01/24/16 1821 01/24/16 2031  GLUCAP 80 72    Microbiology: Results for orders placed or performed during the hospital encounter of 01/23/16  Culture, fungus without smear ([facility]-Only)     Status: None (Preliminary result)   Collection Time: 01/23/16 10:03 PM  Result Value Ref Range Status   Specimen Description CATH TIP  Final   Special Requests NONE  Final   Culture NO FUNGUS ISOLATED AFTER 3 DAYS  Final   Report Status PENDING  Incomplete  MRSA PCR Screening     Status: None   Collection Time: 01/24/16 12:02 AM  Result Value Ref Range Status   MRSA by PCR NEGATIVE NEGATIVE Final    Comment:        The GeneXpert MRSA Assay (FDA approved for NASAL specimens only), is one component of a comprehensive MRSA colonization surveillance program. It is not intended to diagnose MRSA infection nor to guide or monitor treatment for MRSA infections.     Coagulation Studies: No results  for input(s): LABPROT, INR in the last 72 hours.  Urinalysis: No results for input(s): COLORURINE, LABSPEC, PHURINE, GLUCOSEU, HGBUR, BILIRUBINUR, KETONESUR, PROTEINUR, UROBILINOGEN, NITRITE, LEUKOCYTESUR in the last 72 hours.  Invalid input(s): APPERANCEUR    Imaging: No results found.   Medications:     . amiodarone  400 mg Oral BID  . atorvastatin  40 mg Oral QHS  . budesonide (PULMICORT) nebulizer solution  0.5 mg Nebulization BID  . docusate sodium  100 mg Oral Daily  . epoetin (EPOGEN/PROCRIT) injection  10,000 Units Intravenous Q T,Th,Sa-HD  . famotidine (PEPCID) IV  20 mg Intravenous Q48H  . ferrous sulfate  325 mg Oral Daily  . guaiFENesin  600 mg Oral BID  . heparin  5,000 Units Subcutaneous Q8H  . magnesium oxide  400 mg Oral QPM  .  methylPREDNISolone (SOLU-MEDROL) injection  20 mg Intravenous Q12H  . mometasone-formoterol  2 puff Inhalation BID  . sevelamer carbonate  800 mg Oral TID AC  . tiotropium  18 mcg Inhalation Daily   sodium chloride, acetaminophen **OR** acetaminophen, albuterol, alum & mag hydroxide-simeth, guaiFENesin-dextromethorphan, hydrALAZINE, ipratropium-albuterol, labetalol, loperamide, magnesium sulfate 1 - 4 g bolus IVPB, metoprolol, morphine injection, ondansetron, ondansetron, oxyCODONE, phenol, polyethylene glycol, potassium chloride, zolpidem  Assessment/ Plan:  75 y.o. 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  ESRD on HD TTHS: Patient completed hemodialysis yesterday and tolerated well. No acute indication for dialysis today. We will plan for dialysis again on Tuesday.  2.  Anemia of CKD:  Hemoglobin low and stable at 7. Could consider blood transfusion however defer this to primary team. Continue Epogen with dialysis.  3.  SHPTH:  Phosphorus 4.3 and PTH 198.  Continue Renvela.  4.  HTN:  Blood pressure 114/61. Continue amlodipine.  5.  AAA with type I endoleak and right common femoral aneurysm:  Patient with endovascular repair of abdominal aortic aneurysm as well as stent placement in the right common femoral artery. Management per vascular surgery. Possible discharge to nursing home tomorrow.   LOS: 5 Zita Ozimek 5/7/201712:43 PM

## 2016-01-28 NOTE — Consult Note (Signed)
[name] at [place] NAME: [name]    MR#:  [id number]  DATE OF BIRTH:  12-20-1940  SUBJECTIVE: seen,denies any complaints.off amiodarone drip.  CHIEF COMPLAINT:   Chief Complaint  Patient presents with  . Groin Pain    REVIEW OF SYSTEMS:   Review of Systems  Constitutional: Negative for fever, chills and weight loss.  HENT: Negative for hearing loss.   Eyes: Negative for blurred vision, double vision and photophobia.  Respiratory: Negative for cough, hemoptysis and shortness of breath.   Cardiovascular: Negative for chest pain, palpitations, orthopnea and leg swelling.  Gastrointestinal: Negative for vomiting, abdominal pain and diarrhea.  Genitourinary: Negative for dysuria and urgency.  Musculoskeletal: Negative for myalgias and neck pain.  Skin: Negative for rash.  Neurological: Negative for dizziness, focal weakness, seizures, weakness and headaches.  Endo/Heme/Allergies: Does not bruise/bleed easily.  Psychiatric/Behavioral: Negative for memory loss. The patient does not have insomnia.      DRUG ALLERGIES:   Allergies  Allergen Reactions  . Penicillins Hives, Itching and Swelling    Has patient had a PCN reaction causing immediate rash, facial/tongue/throat swelling, SOB or lightheadedness with hypotension: Yes Has patient had a PCN reaction causing severe rash involving mucus membranes or skin necrosis: No Has patient had a PCN reaction that required hospitalization No Has patient had a PCN reaction occurring within the last 10 years: No If all of the above answers are "NO", then may proceed with Cephalosporin use.    VITALS:  Blood pressure 114/61, pulse 72, temperature 97.7 F (36.5 C), temperature source Oral, resp. rate 18, height 6\' 2"  (1.88 m), weight 82.9 kg (182 lb 12.2 oz), SpO2 92 %.  PHYSICAL EXAMINATION:  GENERAL:  75 y.o.-year-old patient lying in the bed with no acute distress. ,markedly  debilitated EYES: Pupils equal, round, reactive to light and accommodation. No scleral icterus. Extraocular muscles intact.  HEENT: Head atraumatic, normocephalic. Oropharynx and nasopharynx clear.  and is poor.  NECK:  Supple, no jugular venous distention. No thyroid enlargement, no tenderness.  LUNGS: Normal breath sounds bilaterally, no wheezing, rales,rhonchi or crepitation. No use of accessory muscles of respiration.  CARDIOVASCULAR: S1, S2 normal. No murmurs, rubs, or gallops.  ABDOMEN: Soft, nontender, nondistended. Bowel sounds present. No organomegaly or mass.  EXTREMITIES:  large l tender pulsatile mass present in the right groin.  NEUROLOGIC: Cranial nerves II through XII are intact. Muscle strength 5/5 in all extremities. Sensation intact. Gait not checked.  PSYCHIATRIC: The patient is alert and oriented x 3.  SKIN: No obvious rash, lesion, or ulcer.    LABORATORY PANEL:   CBC  Recent Labs Lab 01/27/16 0449  WBC 21.3*  HGB 7.0*  HCT 22.8*  PLT 143*   ------------------------------------------------------------------------------------------------------------------  Chemistries   Recent Labs Lab 01/26/16 0501  NA 137  K 4.3  CL 97*  CO2 30  GLUCOSE 92  BUN 20  CREATININE 3.34*  CALCIUM 7.8*  MG 1.7   ------------------------------------------------------------------------------------------------------------------  Cardiac Enzymes  Recent Labs Lab 01/25/16 1059  TROPONINI 0.04*   ------------------------------------------------------------------------------------------------------------------  RADIOLOGY:  No results found.  EKG:   Orders placed or performed during the hospital encounter of 01/23/16  . EKG 12-Lead  . EKG 12-Lead  . EKG 12-Lead  . EKG 12-Lead  . EKG 12-Lead  . 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 It is post  surgery  repair of suprarenal AAA repair. Patient tolerated the procedure well.    #2 milky penile discharge. Seen  is seen by urology. This is likely due to debris  Collected  in the bladder of a patient who does not make urine, no intervention is indicated as per neurology recommendation.  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 #4 . Atrial fibrillation with RVR: on po amiodarone  Patient has acute on chronic anemia, not on full  anticoagulation due to recent surgery.with AAA ,femoral aneurysm repair..   #5. chronic combined systolic and diastolic heart failure, echo showed ef 35%. #6 COPD: Oxygen dependent. He feels short of breath but he is not hypoxic, he is not wheezing.. Continue Pulmicort nebulizer, DuoNeb's, spiriva,followed by pulmonary, started on small dose steroid s; Cor status is likely secondary to steroids  #7 bilateral hydronephrosis, history of bladder cancer, history of metastatic prostate cancer:    Reviewed med records,meds,noted from vascular,HD note,spoke to HD nurse Likely discharge to SNF tomorrow as per vascular note. All the records are reviewed and case discussed with Care Management/Social Workerr. Management plans discussed with the patient, family and they are in agreement.  CODE STATUS: full  TOTAL TIME TAKING CARE OF THIS PATIENT: 25  minutes.   POSSIBLE D/C IN 1-2DAYS, DEPENDING ON CLINICAL CONDITION.   Epifanio Lesches M.D on 01/28/2016 at 11:04 AM  Between 7am to 6pm - Pager - [phone]  After 6pm go to [email or web address] - password [email or web address] [facility]  Office  [phone]  CC: Primary care physician; Dion Body, MD   Note: This dictation was prepared with Dragon dictation along with smaller phrase technology. Any transcriptional errors that result from this process are unintentional.

## 2016-01-29 DIAGNOSIS — D62 Acute posthemorrhagic anemia: Secondary | ICD-10-CM

## 2016-01-29 DIAGNOSIS — R0602 Shortness of breath: Secondary | ICD-10-CM | POA: Insufficient documentation

## 2016-01-29 DIAGNOSIS — I480 Paroxysmal atrial fibrillation: Secondary | ICD-10-CM

## 2016-01-29 DIAGNOSIS — J449 Chronic obstructive pulmonary disease, unspecified: Secondary | ICD-10-CM

## 2016-01-29 DIAGNOSIS — I5042 Chronic combined systolic (congestive) and diastolic (congestive) heart failure: Secondary | ICD-10-CM

## 2016-01-29 LAB — CBC
HEMATOCRIT: 25.3 % — AB (ref 40.0–52.0)
Hemoglobin: 8 g/dL — ABNORMAL LOW (ref 13.0–18.0)
MCH: 27 pg (ref 26.0–34.0)
MCHC: 31.7 g/dL — ABNORMAL LOW (ref 32.0–36.0)
MCV: 85.2 fL (ref 80.0–100.0)
PLATELETS: 135 10*3/uL — AB (ref 150–440)
RBC: 2.97 MIL/uL — AB (ref 4.40–5.90)
RDW: 17.4 % — ABNORMAL HIGH (ref 11.5–14.5)
WBC: 12.6 10*3/uL — AB (ref 3.8–10.6)

## 2016-01-29 LAB — BASIC METABOLIC PANEL
ANION GAP: 12 (ref 5–15)
BUN: 42 mg/dL — AB (ref 6–20)
CHLORIDE: 93 mmol/L — AB (ref 101–111)
CO2: 29 mmol/L (ref 22–32)
Calcium: 8.3 mg/dL — ABNORMAL LOW (ref 8.9–10.3)
Creatinine, Ser: 4.98 mg/dL — ABNORMAL HIGH (ref 0.61–1.24)
GFR calc Af Amer: 12 mL/min — ABNORMAL LOW (ref >60)
GFR calc non Af Amer: 10 mL/min — ABNORMAL LOW (ref >60)
GLUCOSE: 140 mg/dL — AB (ref 65–99)
POTASSIUM: 5.2 mmol/L — AB (ref 3.5–5.1)
Sodium: 134 mmol/L — ABNORMAL LOW (ref 135–145)

## 2016-01-29 LAB — MAGNESIUM: Magnesium: 2.2 mg/dL (ref 1.7–2.4)

## 2016-01-29 MED ORDER — OXYCODONE HCL 5 MG PO TABS: 5.0000 mg | ORAL_TABLET | Freq: Four times a day (QID) | ORAL | Status: DC | PRN

## 2016-01-29 NOTE — Care Management Important Message (Signed)
Important Message  Patient Details  Name: [name] MRN: [id number] Date of Birth: 06-27-1941   Medicare Important Message Given:  Yes    Syvanna Ciolino A, RN 01/29/2016, 7:14 AM

## 2016-01-29 NOTE — Progress Notes (Signed)
Per MD Kolluru order a tap water enema

## 2016-01-29 NOTE — Progress Notes (Signed)
[facility]  ROUNDING NOTE   Subjective:   Laying in bed, in good mood. Complains of constipation.   Status post 1 unit PRBC yesterday. Hemoglobin 8 (7)  Wbc 12.6 (21.3)   Objective:  Vital signs in last 24 hours:  Temp:  [97.5 F (36.4 C)-98 F (36.7 C)] 98 F (36.7 C) (05/08 0443) Pulse Rate:  [63-73] 63 (05/08 0443) Resp:  [18] 18 (05/08 0443) BP: (106-113)/(59) 106/59 mmHg (05/08 0443) SpO2:  [98 %-99 %] 98 % (05/08 0719)  Weight change:  Filed Weights   01/23/16 2050 01/24/16 0700 01/27/16 0830  Weight: 85.095 kg (187 lb 9.6 oz) 85.095 kg (187 lb 9.6 oz) 82.9 kg (182 lb 12.2 oz)    Intake/Output: I/O last 3 completed shifts: In: 840 [P.O.:840] Out: -    Intake/Output this shift:     Physical Exam: General: NAD, resting in bed  Head: Normocephalic, atraumatic. Moist oral mucosal membranes  Eyes: Anicteric  Neck: Supple, trachea midline  Lungs:  CTAB, normal effort  Heart: S1S2 no rubs  Abdomen:  Soft, non tender, non distended  Extremities: trace peripheral edema.  Neurologic: Nonfocal, moving all four extremities  Skin: No lesions  Access: LUE AVF    Basic Metabolic Panel:  Recent Labs Lab 01/23/16 0909 01/24/16 0756 01/24/16 1058 01/26/16 0501 01/29/16 0529  NA 140  --  140 137 134*  K 4.5  --  4.0 4.3 5.2*  CL 102  --  98* 97* 93*  CO2 30  --  30 30 29   GLUCOSE 85  --  92 92 140*  BUN 63*  --  31* 20 42*  CREATININE 3.76*  --  2.05* 3.34* 4.98*  CALCIUM 8.4*  --  8.4* 7.8* 8.3*  MG  --   --   --  1.7 2.2  PHOS  --  4.3  --   --   --     Liver Function Tests: No results for input(s): AST, ALT, ALKPHOS, BILITOT, PROT, ALBUMIN in the last 168 hours. No results for input(s): LIPASE, AMYLASE in the last 168 hours. No results for input(s): AMMONIA in the last 168 hours.  CBC:  Recent Labs Lab 01/24/16 1058 01/26/16 0501 01/27/16 0449 01/29/16 0529  WBC 7.4 16.2* 21.3* 12.6*  HGB 9.0* 7.0* 7.0* 8.0*  HCT 28.6* 22.9*  22.8* 25.3*  MCV 85.2 86.6 85.3 85.2  PLT 210 117* 143* 135*    Cardiac Enzymes:  Recent Labs Lab 01/24/16 2311 01/25/16 0504 01/25/16 1059  TROPONINI 0.03 0.03 0.04*    BNP: Invalid input(s): POCBNP  CBG:  Recent Labs Lab 01/24/16 1821 01/24/16 2031  GLUCAP 80 42    Microbiology: Results for orders placed or performed during the hospital encounter of 01/23/16  Culture, fungus without smear ([facility]-Only)     Status: None (Preliminary result)   Collection Time: 01/23/16 10:03 PM  Result Value Ref Range Status   Specimen Description CATH TIP  Final   Special Requests NONE  Final   Culture NO FUNGUS ISOLATED AFTER 3 DAYS  Final   Report Status PENDING  Incomplete  MRSA PCR Screening     Status: None   Collection Time: 01/24/16 12:02 AM  Result Value Ref Range Status   MRSA by PCR NEGATIVE NEGATIVE Final    Comment:        The GeneXpert MRSA Assay (FDA approved for NASAL specimens only), is one component of a comprehensive MRSA colonization surveillance program. It is not intended to  diagnose MRSA infection nor to guide or monitor treatment for MRSA infections.     Coagulation Studies: No results for input(s): LABPROT, INR in the last 72 hours.  Urinalysis: No results for input(s): COLORURINE, LABSPEC, PHURINE, GLUCOSEU, HGBUR, BILIRUBINUR, KETONESUR, PROTEINUR, UROBILINOGEN, NITRITE, LEUKOCYTESUR in the last 72 hours.  Invalid input(s): APPERANCEUR    Imaging: No results found.   Medications:     . amiodarone  400 mg Oral BID  . atorvastatin  40 mg Oral QHS  . budesonide (PULMICORT) nebulizer solution  0.5 mg Nebulization BID  . docusate sodium  100 mg Oral Daily  . epoetin (EPOGEN/PROCRIT) injection  10,000 Units Intravenous Q T,Th,Sa-HD  . famotidine (PEPCID) IV  20 mg Intravenous Q48H  . ferrous sulfate  325 mg Oral Daily  . guaiFENesin  600 mg Oral BID  . heparin  5,000 Units Subcutaneous Q8H  . magnesium oxide  400 mg Oral QPM  .  methylPREDNISolone (SOLU-MEDROL) injection  20 mg Intravenous Q12H  . mometasone-formoterol  2 puff Inhalation BID  . sevelamer carbonate  800 mg Oral TID AC  . 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   1.  ESRD on HD TTHS: Potassium 5.2, however stable.  - Next dialysis treatment for tomorrow, continue TTS schedule.   2.  Anemia of CKD: status post 2 units PRBC since admission - epogen with HD treatment.   3.  SHPTH:   - Continue Renvela.  4.  Hypertension at goal.  -  Continue amlodipine.  5.  AAA with type I endoleak and right common femoral aneurysm:  Patient with endovascular repair of abdominal aortic aneurysm as well as stent placement in the right common femoral artery. Management per vascular surgery.    LOS: [place], Jessicah Croll 5/8/20179:26 AM

## 2016-01-29 NOTE — Progress Notes (Signed)
Patient discharged to [facility], report called to [employer], CL DCd, bled profusely, pressure held approximately 5 minites to control bleeding,  Petrol gauze, gauze, and tegaderm applied, tele monitor turned in, pt transported via EMS, VS stable immediately prior to DC

## 2016-01-29 NOTE — Discharge Summary (Signed)
[facility]    Discharge Summary    Patient ID:  LAQUINTA WIDER MRN: [id number] DOB/AGE: [birth year] 74 y.o.  Admit date: 01/23/2016 Discharge date: 01/29/2016 Date of Surgery: 01/23/2016 - 01/24/2016 Surgeon: Juliann Mule): Katha Cabal, MD Algernon Huxley, MD  Admission Diagnosis: Cough [R05] Pseudoaneurysm of iliac artery [facility]) [I72.3]  Discharge Diagnoses:  Cough [R05] Pseudoaneurysm of iliac artery ([place]) [I72.3]  Secondary Diagnoses: Past Medical History  Diagnosis Date  . PAF (paroxysmal atrial fibrillation) ([place])     a. new onset 12/2014 in the setting of UTI, sepsis, hypotension, and anemia; b. not on long term anticoagulation given anemia; c. family aware of stroke risk, they are ok with this; d. on amiodarone   . Chronic combined systolic and diastolic CHF (congestive heart failure) ([place])     a. echo 12/2014: EF 25-30%, anterior wall wall motion abnormalities; b. planned ischemic evaluation once patient is stable medically; c. echo 01/2015: EF 45-50%, no RWMA, mild AT, LA mildly dilated, mod pericardial effusion along LV free wall, no evidence of hemodynamic compromise  . ESRD on hemodialysis ([place])     a. Tuesday, Thursday, and Saturdays  . Anemia     a. baseline hgb ~ 8  . History of small bowel obstruction     a. 01/2015  . History of septic shock     a. 01/2015  . GERD (gastroesophageal reflux disease)   . Allergic rhinitis   . COPD (chronic obstructive pulmonary disease) ([place])   . Hypercholesteremia   . Cognitive communication deficit   . Magnesium deficiency   . Dementia   . 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 Incontinence   . Hydronephrosis   . Overactive bladder   . Detrusor sphincter dyssynergia   . Mini stroke ([place])   . Bladder cancer ([place])   . Prostate cancer ([place])   . Hypertension     Procedure(s): Endovascular Repair/Stent Graft  Discharged Condition: good  HPI:  Mamadi Ferraris is a 75 y.o. male with a known history end-stage  disease on hemodialysis, paroxysmal A. fib, hypertension, COPD comes to the emergency room from [facility] after he noticed the right groin mass progressively increasing in size and more so felt tender last couple days. Patient was evaluated with ultrasound in the right groin which appears to show pseudoaneurysm. CTA:  Significant enlargement of right common femoral artery pseudoaneurysm now measuring nearly 12 cm in maximum height and 9 cm in maximum transverse diameter. There would be concern of potential impending rupture based on progressive pain and significant enlargement since the prior CTA.   On 01/24/16, the patient underwent a US guidance for vascular access left femoral artery, Open cutdown right superficial femoral artery, Catheter placement into aorta from bilateral femoral approaches, Placement of a 36 x 4.5 Gore Excluder aortic cuff Endoprosthesis for treatment of a suprarenal aortic aneurysm, Extension of suprarenal aortic aneurysm repair using a 36 x 4.5 Gore Excluder aortic cuff endoprosthesis, Placement of a 14 x 18 x 10 cm Gore covered stent left external iliac artery for treatment of external iliac artery stenosis and occlusion, Placement of a 14 x 14 x 14 cm Gore covered stent right external iliac artery for treatment of external iliac artery stenosis combined with aneurysmal disease, Placement of a 14 x 12 x 16 in combination with a 16 x 14 x 14 Gore covered stents right common femoral and right superficial femoral arteries for treatment of an 8 cm common femoral artery aneurysm and  ProGlide closure devices left femoral artery.  He tolerated the procedure well and was transferred to the ICU for close monitoring for close observation. Patient was followed by medicine service to manage his multiple medical issues. Seen by nephrology and received dialysis as per his normal routine. Seen by urology for milky penile discharge. This is likely due to debris Collected in the bladder of a  patient who does not make urine, no intervention is indicated.   Patient in NSR on PO regimen upon discharge.  Procedure(s): Endovascular Repair/Stent Graft  Extubated: POD # 0  Physical exam:  A&Ox3, NAD Neck: Central line intact, clean and dry CV: irregular Pulmonary: CTA Bilaterally Abdomen: Soft, Nontender, Non-distended Groin: Clean and dry, no drainage noted.  Vascular: Bilateral lower extremities warm, non-tender, incision -C/D/I, pseudoaneurysm stable-firm, nonpulsatile, +pedal pulses  Post-op wounds clean, dry, intact or healing well Pt. Ambulating, voiding and taking PO diet without difficulty. Pt pain controlled with PO pain meds. Labs as below Complications:none  Consults:  Treatment Team:  Nickie Retort, MD Anthonette Legato, MD Epifanio Lesches, MD Minna Merritts, MD Laverle Hobby, MD  Significant Diagnostic Studies: CBC Lab Results  Component Value Date   WBC 12.6* 01/29/2016   HGB 8.0* 01/29/2016   HCT 25.3* 01/29/2016   MCV 85.2 01/29/2016   PLT 135* 01/29/2016    BMET    Component Value Date/Time   NA 134* 01/29/2016 0529   NA 141 01/20/2015 0430   K 5.2* 01/29/2016 0529   K 3.6 01/20/2015 0430   CL 93* 01/29/2016 0529   CL 99* 01/20/2015 0430   CO2 29 01/29/2016 0529   CO2 30 01/20/2015 0430   GLUCOSE 140* 01/29/2016 0529   GLUCOSE 119* 01/20/2015 0430   BUN 42* 01/29/2016 0529   BUN 32* 01/20/2015 0430   CREATININE 4.98* 01/29/2016 0529   CREATININE 6.73* 01/20/2015 0430   CALCIUM 8.3* 01/29/2016 0529   CALCIUM 7.8* 01/20/2015 0430   GFRNONAA 10* 01/29/2016 0529   GFRNONAA 7* 01/20/2015 0430   GFRNONAA 14* 08/17/2014 1228   GFRAA 12* 01/29/2016 0529   GFRAA 9* 01/20/2015 0430   GFRAA 17* 08/17/2014 1228   COAG Lab Results  Component Value Date   INR 1.3 01/18/2015   INR 1.2 06/28/2014   INR 1.0 12/08/2011     Disposition:  Discharge to :Skilled nursing facility    Medication List    TAKE these  medications        acetaminophen 325 MG tablet  Commonly known as:  TYLENOL  Take 650 mg by mouth every 8 (eight) hours as needed. For general discomfort     albuterol (2.5 MG/3ML) 0.083% nebulizer solution  Commonly known as:  PROVENTIL  Take 3 mLs (2.5 mg total) by nebulization every 4 (four) hours as needed for wheezing or shortness of breath.     amiodarone 200 MG tablet  Commonly known as:  PACERONE  Take 1 tablet (200 mg total) by mouth daily.     amLODipine 2.5 MG tablet  Commonly known as:  NORVASC  Take 2.5 mg by mouth daily.     atorvastatin 40 MG tablet  Commonly known as:  LIPITOR  Take 40 mg by mouth at bedtime.     budesonide-formoterol 160-4.5 MCG/ACT inhaler  Commonly known as:  SYMBICORT  Inhale 2 puffs into the lungs 2 (two) times daily.     ferrous sulfate 325 (65 FE) MG tablet  Take 325 mg by mouth daily.  guaiFENesin 600 MG 12 hr tablet  Commonly known as:  MUCINEX  Take 1 tablet (600 mg total) by mouth 2 (two) times daily.     loperamide 2 MG tablet  Commonly known as:  IMODIUM A-D  Take 2 tablets (4 mg total) by mouth 4 (four) times daily as needed for diarrhea or loose stools.     magnesium oxide 400 MG tablet  Commonly known as:  MAG-OX  Take 1 tablet (400 mg total) by mouth every evening.     ondansetron 8 MG disintegrating tablet  Commonly known as:  ZOFRAN ODT  Take 1 tablet (8 mg total) by mouth every 8 (eight) hours as needed for nausea or vomiting.     oxyCODONE 5 MG immediate release tablet  Commonly known as:  Oxy IR/ROXICODONE  Take 1 tablet (5 mg total) by mouth every 6 (six) hours as needed for moderate pain or severe pain.     polyethylene glycol packet  Commonly known as:  MIRALAX / GLYCOLAX  Take 17 g by mouth daily as needed for mild constipation, moderate constipation or severe constipation.     sevelamer carbonate 800 MG tablet  Commonly known as:  RENVELA  Take 800 mg by mouth 3 (three) times daily before meals.      tiotropium 18 MCG inhalation capsule  Commonly known as:  SPIRIVA  Place 1 capsule (18 mcg total) into inhaler and inhale daily.       Verbal and written Discharge instructions given to the patient. Wound care per Discharge AVS Follow-up Information    Follow up with [facility].   Specialty:  Urology   Why:  cystoscopy  test scheduled for Monday Feb 12, 2016 @ 1130 in office.    [place]   Contact information:   [address], [place] [phone]      Follow up with Delana Meyer, Dolores Lory, MD.   Specialties:  Vascular Surgery, Cardiology, Radiology, Vascular Surgery   Why:  Aortoiliac Duplex   Contact information:   [place] [address] [phone]       Signed: Sela Hua, PA-C  01/29/2016, 8:46 AM

## 2016-01-29 NOTE — Clinical Social Work Note (Signed)
Patient's contact person: [name] has called CSW making CSW aware that she is aware and in agreement with discharge back to [facility]. [name] MSW,LCSW [phone]

## 2016-01-29 NOTE — Discharge Instructions (Signed)
Patient may shower.  °

## 2016-01-29 NOTE — Progress Notes (Signed)
* [facility]     Assessment and Plan:  75 yo AAM with symptomatic common femoral artery aneurysm seen by vascular and taken to surgery for endovascular repair of aortic-iliac-femoral aneurysms. History of COPD on chronic oxygen.   Respiratory status appears stable at this time, patient is ok to discharge from respiratory standpoint, and has already had discharge orders placed.   --Chronic respiratory failure due to COPD--appears stable-- continue with OP regimen of symbicort, spiriva, albuterol. Can follow up with pulmonary outpatient, will have my office contact patient for follow up.      Date: 01/29/2016  MRN# [id number] [name] [birth year]   [name] is a 75 y.o. old male seen in follow up for chief complaint of  Chief Complaint  Patient presents with  . Groin Pain     HPI:  Pt looks and feels well, is looking forward to going home.    Allergies:  Penicillins  Review of Systems: Gen:  Denies  fever, sweats. HEENT: Denies blurred vision. Cvc:  No dizziness, chest pain or heaviness Resp:   Denies cough or sputum porduction. Gi: Denies swallowing difficulty, stomach pain. constipation, bowel incontinence Gu:  Denies bladder incontinence, burning urine Ext:   No Joint pain, stiffness. Skin: No skin rash, easy bruising. Endoc:  No polyuria, polydipsia. Psych: No depression, insomnia. Other:  All other systems were reviewed and found to be negative other than what is mentioned in the HPI.   Physical Examination:   VS: BP 106/59 mmHg  Pulse 63  Temp(Src) 98 F (36.7 C) (Oral)  Resp 18  Ht 6\' 2"  (1.88 m)  Wt 182 lb 12.2 oz (82.9 kg)  BMI 23.46 kg/m2  SpO2 98%  General Appearance: No distress  Neuro:without focal findings,  speech normal,  HEENT: PERRLA, EOM intact. Pulmonary: normal breath sounds, No wheezing.  Decreased air entry bilaterally.  CardiovascularNormal S1,S2.  No m/r/g.   Abdomen: Benign, Soft,  non-tender. Renal:  No costovertebral tenderness  GU:  Not performed at this time. Endoc: No evident thyromegaly, no signs of acromegaly. Skin:   warm, no rash. Extremities: normal, no cyanosis, clubbing.   LABORATORY PANEL:   CBC  Recent Labs Lab 01/29/16 0529  WBC 12.6*  HGB 8.0*  HCT 25.3*  PLT 135*   ------------------------------------------------------------------------------------------------------------------  Chemistries   Recent Labs Lab 01/29/16 0529  NA 134*  K 5.2*  CL 93*  CO2 29  GLUCOSE 140*  BUN 42*  CREATININE 4.98*  CALCIUM 8.3*  MG 2.2   ------------------------------------------------------------------------------------------------------------------  Cardiac Enzymes  Recent Labs Lab 01/25/16 1059  TROPONINI 0.04*   ------------------------------------------------------------  RADIOLOGY:   No results found for this or any previous visit. Results for orders placed during the hospital encounter of 11/23/15  DG Chest 2 View   Narrative CLINICAL DATA:  Shortness of breath and cough for several weeks. Chronic renal failure. Hypertension.  EXAM: CHEST  2 VIEW  COMPARISON:  October 19, 2015 ; June 06, 2015  FINDINGS: There is mild atelectatic change in the right base. There is a minimal right pleural effusion. The lungs elsewhere are clear. Heart is upper normal in size with pulmonary vascularity within normal limits. Aorta is somewhat prominent and tortuous, stable from prior studies. No demonstrable adenopathy. No bone lesions.  IMPRESSION: No edema or consolidation. Minimal right pleural effusion with mild right base atelectasis. No change in cardiac silhouette. Aortic prominence and tortuosity may reflect chronic hypertensive change.   Electronically Signed   By:  Lowella Grip III M.D.   On: 11/23/2015 10:21     ------------------------------------------------------------------------------------------------------------------  Thank  you for allowing [facility] to assist in the care of your patient. Our recommendations are noted above.  Please contact us if we can be of further service.   Marda Stalker, MD.  [facility] Office Number: [phone]  Patricia Pesa, M.D.  Vilinda Boehringer, M.D.  Merton Border, M.D  01/29/2016

## 2016-01-29 NOTE — Progress Notes (Signed)
Patient: [name] / Admit Date: 01/23/2016 / Date of Encounter: 01/29/2016, 9:34 AM   Subjective: Feels well, mild SOB, Some cough, nonproductive HD Saturday,   Review of Systems: Review of Systems  Constitutional: Negative.   Respiratory: Positive for cough and shortness of breath.   Cardiovascular: Negative.   Gastrointestinal: Negative.   Musculoskeletal: Negative.   Neurological: Negative.   Psychiatric/Behavioral: Negative.   All other systems reviewed and are negative. (poor historian)  Objective: Telemetry: NSR Physical Exam: Blood pressure 106/59, pulse 63, temperature 98 F (36.7 C), temperature source Oral, resp. rate 18, height 6\' 2"  (1.88 m), weight 182 lb 12.2 oz (82.9 kg), SpO2 98 %. Body mass index is 23.46 kg/(m^2). General:Frail appearing, in no acute distress. Head: Normocephalic, atraumatic, sclera non-icteric, no xanthomas, nares are without discharge. Neck: Negative for carotid bruits. JVD not elevated. Lungs: moderately decreased BS, scant rales, without wheezes, or rhonchi. Breathing is unlabored. Heart: RRR with S1 S2. No murmurs, rubs, or gallops appreciated. Abdomen: Soft, non-tender, non-distended with normoactive bowel sounds. No hepatomegaly. No rebound/guarding. No obvious abdominal masses. Msk: Strength and tone appear normal for age. Extremities: No clubbing or cyanosis. No edema. Distal pedal pulses are 2+ and equal bilaterally. Neuro: Alert and oriented X 3. No facial asymmetry. No focal deficit. Moves all extremities spontaneously. Psych: Responds to questions appropriately with a normal affect.   Intake/Output Summary (Last 24 hours) at 01/29/16 0934 Last data filed at 01/28/16 1630  Gross per 24 hour  Intake    480 ml  Output      0 ml  Net    480 ml    Inpatient Medications:  . amiodarone  400 mg Oral BID  . atorvastatin  40 mg Oral QHS  . budesonide (PULMICORT) nebulizer solution  0.5 mg Nebulization BID  .  docusate sodium  100 mg Oral Daily  . epoetin (EPOGEN/PROCRIT) injection  10,000 Units Intravenous Q T,Th,Sa-HD  . famotidine (PEPCID) IV  20 mg Intravenous Q48H  . ferrous sulfate  325 mg Oral Daily  . guaiFENesin  600 mg Oral BID  . heparin  5,000 Units Subcutaneous Q8H  . magnesium oxide  400 mg Oral QPM  . methylPREDNISolone (SOLU-MEDROL) injection  20 mg Intravenous Q12H  . mometasone-formoterol  2 puff Inhalation BID  . sevelamer carbonate  800 mg Oral TID AC  . tiotropium  18 mcg Inhalation Daily   Infusions:    Labs:  Recent Labs  01/29/16 0529  NA 134*  K 5.2*  CL 93*  CO2 29  GLUCOSE 140*  BUN 42*  CREATININE 4.98*  CALCIUM 8.3*  MG 2.2   No results for input(s): AST, ALT, ALKPHOS, BILITOT, PROT, ALBUMIN in the last 72 hours.  Recent Labs  01/27/16 0449 01/29/16 0529  WBC 21.3* 12.6*  HGB 7.0* 8.0*  HCT 22.8* 25.3*  MCV 85.3 85.2  PLT 143* 135*   No results for input(s): CKTOTAL, CKMB, TROPONINI in the last 72 hours. Invalid input(s): POCBNP No results for input(s): HGBA1C in the last 72 hours.   Weights: Filed Weights   01/23/16 2050 01/24/16 0700 01/27/16 0830  Weight: 187 lb 9.6 oz (85.095 kg) 187 lb 9.6 oz (85.095 kg) 182 lb 12.2 oz (82.9 kg)     Radiology/Studies:  Dg Chest 1 View  01/23/2016  CLINICAL DATA:  Cough intermittently for several months EXAM: CHEST 1 VIEW COMPARISON:  11/23/2015 FINDINGS: There are trace bilateral pleural effusions. There is bilateral mild interstitial thickening.  There is no pneumothorax. There is mild bibasilar atelectasis. There is stable cardiomegaly. The osseous structures are unremarkable. IMPRESSION: Mild CHF. 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 EXAM: CT  ANGIOGRAPHY OF ABDOMINAL AORTA WITH ILIOFEMORAL RUNOFF TECHNIQUE: Multidetector CT imaging of the abdomen, pelvis and lower extremities was performed using the standard protocol during bolus administration of intravenous contrast. Multiplanar CT image reconstructions and MIPs were obtained to evaluate the vascular anatomy. CONTRAST:  100 mL Isovue 370 IV COMPARISON:  Prior CTA of the abdomen and pelvis on 06/09/2015 as well as right groin ultrasound earlier today. FINDINGS: Aorta: The aortic sac surrounding the endograft shows interval enlargement with current maximal dimensions of approximately 5.3 x 6.1 cm compared to maximal diameter on prior study of 5.4 cm. Prominent anterior endoleak again noted extending to the left side of the aortic sac. This again most likely represents an endoleak supplied by the inferior mesenteric artery. There is at least 1 opacified lumbar artery posteriorly to the left of midline at roughly the L4 level. This may represent additional inflow or potentially outflow. There is no evidence of type 1 endoleak. Distal endograft limbs appear normally patent and stable in position. Stable mild stenosis at the origin of the celiac axis. Stable fusiform dilatation of the SMA trunk. Stable fusiform dilatation of the proximal splenic artery and the common hepatic artery. Right Lower Extremity: Right iliac arteries are tortuous and normally patent. The internal iliac artery is occluded by embolization coils. There is an enlarging aneurysm of the right common femoral artery that also involves the proximal SFA. Based on appearance this is most likely an elongated pseudoaneurysm. Maximal dimensions are currently 9.1 x 6.3 cm transversely and 11.9 cm in maximum height. This represents significant enlargement since prior CTA at which time maximal aneurysm dimensions were approximately 5.9 cm. The right SFA shows diffuse fusiform dilatation calcified plaque with maximum diameter of approximately 14 mm.  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 Just beyond the adductor hiatus and above the knee joint, the popliteal aneurysm measures approximately 4.3 x 4.3 cm. The popliteal artery is tortuous and becomes normal in caliber just below the knee joint. Tibial artery opacification is suboptimal. The posterior tibial artery appears continuously patent into the foot. The peroneal artery also demonstrates patency into the foot. The anterior tibial artery is likely occluded at the mid calf level. Left Lower Extremity: Left native iliac arteries are normally patent. The internal iliac artery is occluded by embolization coils. There is fusiform dilatation of the common femoral artery measuring up to 16 mm in diameter. The SFA also shows fusiform dilatation and measures 17 mm. Aneurysmal disease of the distal SFA and popliteal artery present. At the juncture of the SFA and popliteal artery near the adductor hiatus, aneurysmal disease measures 4.8 x 5.3 cm. Tortuous and aneurysmal popliteal artery continues to the knee joint. Below the knee the popliteal artery shows no significant aneurysmal disease. Tibial evaluation is limited with patency of the peroneal artery visible into the foot. The posterior and anterior tibial arteries likely are occluded in the mid to distal calf. There is stable chronic bilateral hydronephrosis and hydroureter with diffuse bladder wall thickening present. Stable bilateral adrenal nodularity. No evidence of bowel obstruction or inflammation.  No free air, free fluid or abscess identified in the abdomen or pelvis. No lymphadenopathy. No hernias are seen. Bony structures demonstrate moderate spondylosis of the lumbar spine. Review of the MIP images confirms the above findings. IMPRESSION: 1. 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 There would be concern of potential  impending rupture based on progressive pain and significant enlargement since the prior CTA. Vascular surgical consultation is recommended. 2. Interval enlargement of the aneurysm sac surrounding the endograft after prior EVAR. The aneurysm sac now measures 6.1 cm in greatest diameter compared to 5.4 cm on the prior study. A prominent type 2 endoleak is again noted, most likely supplied by the inferior mesenteric artery with at least one opacified left lumbar artery noted which may be acting as outflow. 3. 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 Tortuous aneurysmal disease of the right popliteal artery measures 4.3 cm in diameter and maximal diameter of aneurysmal disease on the left at the SFA/popliteal junction is 5.3 cm. Tibial evaluation is limited, likely reflecting slow flow. The right anterior tibial artery is likely occluded. The left posterior and anterior tibial arteries are likely occluded. Electronically Signed   By: Aletta Edouard M.D.   On: 01/23/2016 15:48   [place] Extrem Low Right Ltd  01/23/2016  CLINICAL DATA:  Patient presents with a right groin mass with associated pain. Patient with a dialysis patient scheduled for dialysis today. History of right iliac aneurysm. Patient has an aortoiliac stent graft. EXAM: ULTRASOUND RIGHT LOWER EXTREMITY LIMITED TECHNIQUE: Ultrasound examination of the lower extremity soft tissues was performed in the area of clinical concern. COMPARISON:  None FINDINGS: There is a heterogeneous mass with a cystic component in the right inguinal region measuring 8.5 x 6.7 x 6.8 cm. This cystic component measures approximately 8.5 x 5.0 x 5.2 cm. On color Doppler analysis, there is prominent, to and fro blood flow throughout the cystic component. No other abnormalities. IMPRESSION: 1. 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 This was present on the prior CT. There is anterior thrombus with significant  turbulent blood flow in the cystic component. Electronically Signed   By: Lajean Manes M.D.   On: 01/23/2016 11:06   Dg Chest Port 1 View  01/24/2016  CLINICAL DATA:  Bedside central venous catheter placement. EXAM: PORTABLE CHEST 1 VIEW 5:19 p.m.: COMPARISON:  Portable chest x-ray earlier today 5:08 a.m. and previously. FINDINGS: Right jugular central venous catheter tip projects over the lower SVC at or near the cavoatrial junction. No evidence of pneumothorax mediastinal hematoma. Cardiac silhouette moderately enlarged, unchanged. Pulmonary venous hypertension and mild interstitial pulmonary edema, unchanged. Bilateral pleural effusions, unchanged. Atelectasis involving the lower lobes, left greater than right, unchanged. No new pulmonary parenchymal abnormality. IMPRESSION: 1. Right jugular central venous catheter tip projects over the lower SVC at or near the cavoatrial junction. No acute complicating features. 2. Stable mild CHF, small bilateral pleural effusions and bilateral lower lobe atelectasis, left greater than right, since earlier today. No new abnormalities. Electronically Signed   By: Evangeline Dakin M.D.   On: 01/24/2016 17:50   Dg Chest Port 1 View  01/24/2016  CLINICAL DATA:  Shortness of breath. EXAM: PORTABLE CHEST 1 VIEW COMPARISON:  01/23/2016 FINDINGS: Cardiac enlargement with mild vascular congestion. Slight interstitial pattern in the lung bases likely indicating edema. Small bilateral pleural effusions and basilar atelectasis. Similar appearance  to previous study. IMPRESSION: Cardiac enlargement with mild vascular congestion and mild interstitial edema. Small bilateral pleural effusions with basilar atelectasis. Electronically Signed   By: Lucienne Capers M.D.   On: 01/24/2016 05:32     Assessment and Plan  75 y.o. male   1. PAF with RVR/SVT:  in sinus rhythm  -Converted to sinus rhythm on amiodarone infusion.  -Continue  amiodarone po, -Not a candidate for digoxin given  her ESRD -CHADS2VASc at least 3 (CHF, HTN, age x 1) Not on anticoagulation secondary to ESRD on HD  2. Chronic combined CHF: -Volume managed by HD -Not on BB at this time given hypotension  Not on aldactone secondary to hypotension and on HD  3. 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 Hypotension: -Stable throughout this admission -Would hold amlodipine   5. ESRD on HD: -Per renal  6. Acute on chronic anemia: On epo   7. COPD: -No acute exacerbation  Chronic cough  D/c planning home per renal, potentially after HD tomorrow  Signed, Esmond Plants, MD, Ph.D. [facility] 01/29/2016, 9:35 AM

## 2016-01-30 ENCOUNTER — Telehealth: Payer: Self-pay | Admitting: *Deleted

## 2016-01-30 NOTE — Telephone Encounter (Signed)
Tried to call pt to schedule hosp f/u with DR in 1 month. No VM setup. Will try to call back later.

## 2016-01-30 NOTE — Telephone Encounter (Signed)
-----   Message from Laverle Hobby, MD sent at 01/29/2016 11:13 AM EDT ----- Regarding: hfu Needs hfu with me in about a month.

## 2016-01-31 NOTE — Telephone Encounter (Signed)
Tried to call pt. VM not set up. Will call back later.

## 2016-02-02 ENCOUNTER — Encounter: Payer: Self-pay | Admitting: *Deleted

## 2016-02-02 NOTE — Telephone Encounter (Signed)
Tried to call pt and phone disconnected. Tried to call pt back but no VM available. Will mail letter.

## 2016-02-12 ENCOUNTER — Ambulatory Visit (INDEPENDENT_AMBULATORY_CARE_PROVIDER_SITE_OTHER): Payer: Medicare Other | Admitting: Urology

## 2016-02-12 ENCOUNTER — Encounter: Payer: Self-pay | Admitting: Urology

## 2016-02-12 VITALS — BP 132/67 | HR 79 | Ht 74.0 in

## 2016-02-12 DIAGNOSIS — C678 Malignant neoplasm of overlapping sites of bladder: Secondary | ICD-10-CM

## 2016-02-12 DIAGNOSIS — Z992 Dependence on renal dialysis: Secondary | ICD-10-CM | POA: Diagnosis not present

## 2016-02-12 DIAGNOSIS — Z8546 Personal history of malignant neoplasm of prostate: Secondary | ICD-10-CM | POA: Insufficient documentation

## 2016-02-12 DIAGNOSIS — N186 End stage renal disease: Secondary | ICD-10-CM

## 2016-02-12 MED ORDER — CIPROFLOXACIN HCL 500 MG PO TABS: 500.0000 mg | ORAL_TABLET | Freq: Once | ORAL | Status: DC

## 2016-02-12 MED ORDER — LIDOCAINE HCL 2 % EX GEL
1.0000 "application " | Freq: Once | CUTANEOUS | Status: AC
  Administered 2016-02-12: 1 via URETHRAL

## 2016-02-12 NOTE — Progress Notes (Signed)
02/12/2016 11:09 AM   [name] September 15, 1941 [id number]  Referring provider: Dion Body, MD [place] [facility] [place], [place] [address]  No chief complaint on file.   HPI:  1 - Metastatic Prostate Cancer - on [facility] agonist through [facility] cancer cener for long h/o prostate cancer with  Bone mets. PSA 2.0 10/2015  2. Urethral drainage - occastional drainage from his penis while changing him noted by NSG at prior hosptialization. He makes very little urine.   3. 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 Minimally compliant with surveillance 01/2016 Cysto - NED (significant debris from mucosal soughing as anuric, but no papilary lesions)  4. Bilateral hydronephrosis The patient has history of bilateral hydronephrosis secondary to prostate cancer causing obstruction. He has a history of bilateral ureteral stents. These were removed last year by Dr. Erlene Quan as they were felt to be no longer indicated as the patient was anuric and on hemodialysis.   PMH: Past Medical History  Diagnosis Date  . PAF (paroxysmal atrial fibrillation) ([place])     a. new onset 12/2014 in the setting of UTI, sepsis, hypotension, and anemia; b. not on long term anticoagulation given anemia; c. family aware of stroke risk, they are ok with this; d. on amiodarone   . Chronic combined systolic and diastolic CHF (congestive heart failure) ([place])     a. echo 12/2014: EF 25-30%, anterior wall wall motion abnormalities; b. planned ischemic evaluation once patient is stable medically; c. echo 01/2015: EF 45-50%, no RWMA, mild AT, LA mildly dilated, mod pericardial effusion along LV free wall, no evidence of hemodynamic compromise  . ESRD on hemodialysis ([place])     a. Tuesday, Thursday, and Saturdays  . Anemia     a. baseline hgb ~ 8  . History of small bowel obstruction     a. 01/2015  . History of septic shock     a. 01/2015  . GERD  (gastroesophageal reflux disease)   . Allergic rhinitis   . COPD (chronic obstructive pulmonary disease) ([place])   . Hypercholesteremia   . Cognitive communication deficit   . Magnesium deficiency   . Dementia   . 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 Incontinence   . Hydronephrosis   . Overactive bladder   . Detrusor sphincter dyssynergia   . Mini stroke ([place])   . Bladder cancer ([place])   . Prostate cancer ([place])   . Hypertension     Surgical History: Past Surgical History  Procedure Laterality Date  . Cystoscopy w/ ureteral stent placement    . Transurethral resection of bladder tumor with gyrus (turbt-gyrus)    . Ureteral stent placement    . Prostate surgery      Removal  . Av fistula placement      left arm  . Peripheral vascular catheterization N/A 01/24/2016    Procedure: Endovascular Repair/Stent Graft;  Surgeon: Katha Cabal, MD;  Location: [facility];  Service: Cardiovascular;  Laterality: N/A;    Home Medications:    Medication List       This list is accurate as of: 02/12/16 11:09 AM.  Always use your most recent med list.               acetaminophen 325 MG tablet  Commonly known as:  TYLENOL  Take 650 mg by mouth every 8 (eight) hours as needed. For general discomfort     albuterol (2.5  MG/3ML) 0.083% nebulizer solution  Commonly known as:  PROVENTIL  Take 3 mLs (2.5 mg total) by nebulization every 4 (four) hours as needed for wheezing or shortness of breath.     amiodarone 200 MG tablet  Commonly known as:  PACERONE  Take 1 tablet (200 mg total) by mouth daily.     amLODipine 2.5 MG tablet  Commonly known as:  NORVASC  Take 2.5 mg by mouth daily.     atorvastatin 40 MG tablet  Commonly known as:  LIPITOR  Take 40 mg by mouth at bedtime.     budesonide-formoterol 160-4.5 MCG/ACT inhaler  Commonly known as:  SYMBICORT  Inhale 2 puffs into the lungs 2 (two) times daily.     ferrous sulfate 325 (65 FE) MG tablet  Take 325 mg by mouth daily.       guaiFENesin 600 MG 12 hr tablet  Commonly known as:  MUCINEX  Take 1 tablet (600 mg total) by mouth 2 (two) times daily.     loperamide 2 MG tablet  Commonly known as:  IMODIUM A-D  Take 2 tablets (4 mg total) by mouth 4 (four) times daily as needed for diarrhea or loose stools.     magnesium oxide 400 MG tablet  Commonly known as:  MAG-OX  Take 1 tablet (400 mg total) by mouth every evening.     ondansetron 8 MG disintegrating tablet  Commonly known as:  ZOFRAN ODT  Take 1 tablet (8 mg total) by mouth every 8 (eight) hours as needed for nausea or vomiting.     oxyCODONE 5 MG immediate release tablet  Commonly known as:  Oxy IR/ROXICODONE  Take 1 tablet (5 mg total) by mouth every 6 (six) hours as needed for moderate pain or severe pain.     polyethylene glycol packet  Commonly known as:  MIRALAX / GLYCOLAX  Take 17 g by mouth daily as needed for mild constipation, moderate constipation or severe constipation.     sevelamer carbonate 800 MG tablet  Commonly known as:  RENVELA  Take 800 mg by mouth 3 (three) times daily before meals.     tiotropium 18 MCG inhalation capsule  Commonly known as:  SPIRIVA  Place 1 capsule (18 mcg total) into inhaler and inhale daily.        Allergies:  Allergies  Allergen Reactions  . Penicillins Hives, Itching and Swelling    Has patient had a PCN reaction causing immediate rash, facial/tongue/throat swelling, SOB or lightheadedness with hypotension: Yes Has patient had a PCN reaction causing severe rash involving mucus membranes or skin necrosis: No Has patient had a PCN reaction that required hospitalization No Has patient had a PCN reaction occurring within the last 10 years: No If all of the above answers are "NO", then may proceed with Cephalosporin use.    Family History: Family History  Problem Relation Age of Onset  . Stroke Mother     Social History:  reports that he quit smoking about 9 months ago. He has never used  smokeless tobacco. He reports that he does not drink alcohol or use illicit drugs.   Review of Systems  Gastrointestinal (upper)  : Negative for upper GI symptoms  Gastrointestinal (lower) : Negative for lower GI symptoms  Constitutional : Negative for symptoms  Skin: Negative for skin symptoms  Eyes: Negative for eye symptoms  Ear/Nose/Throat : Negative for Ear/Nose/Throat symptoms  Hematologic/Lymphatic: Negative for Hematologic/Lymphatic symptoms  Cardiovascular : Leg swelling  Respiratory :  Cough Shortness of breath  Endocrine: Negative for endocrine symptoms  Musculoskeletal: Negative for musculoskeletal symptoms  Neurological: Negative for neurological symptoms  Psychologic: Negative for psychiatric symptoms     Physical Exam: There were no vitals taken for this visit.  Constitutional:  Alert and oriented, No acute distress. On O2. In wheelchair. HEENT: [place] AT, moist mucus membranes.  Trachea midline, no masses. Cardiovascular: No clubbing, cyanosis, or edema. Respiratory: Some increased WOB on O2.  GI: Abdomen is soft, nontender, nondistended, no abdominal masses GU: No CVA tenderness. Non-curic'd. No phimosis.  Skin: No rashes, bruises or suspicious lesions. Lymph: No cervical or inguinal adenopathy. Neurologic: Grossly intact, no focal deficits, moving all 4 extremities. LUE thrill palpable.  Psychiatric: Normal mood and affect.  Laboratory Data: Lab Results  Component Value Date   WBC 12.6* 01/29/2016   HGB 8.0* 01/29/2016   HCT 25.3* 01/29/2016   MCV 85.2 01/29/2016   PLT 135* 01/29/2016    Lab Results  Component Value Date   CREATININE 4.98* 01/29/2016    Lab Results  Component Value Date   PSA 2.00 11/08/2015   PSA 1.44 06/08/2015   PSA 2.15 01/30/2015    Lab Results  Component Value Date   TESTOSTERONE 28* 01/30/2015    Lab Results  Component Value Date   HGBA1C UNABLE TO REPORT A1C DUE TO UNKNOWN 10/19/2015     Urinalysis    Component Value Date/Time   COLORURINE YELLOW* 03/21/2015 1043   COLORURINE Yellow 01/20/2015 1609   APPEARANCEUR Cloudy* 04/10/2015 1206   APPEARANCEUR TURBID* 03/21/2015 1043   APPEARANCEUR Turbid 01/20/2015 1609   LABSPEC 1.012 03/21/2015 1043   LABSPEC 1.011 01/20/2015 1609   PHURINE 7.0 03/21/2015 1043   PHURINE 7.0 01/20/2015 1609   GLUCOSEU Trace* 04/10/2015 1206   GLUCOSEU Negative 01/20/2015 1609   HGBUR 2+* 03/21/2015 1043   HGBUR 2+ 01/20/2015 1609   BILIRUBINUR Negative 04/10/2015 1206   BILIRUBINUR NEGATIVE 03/21/2015 1043   BILIRUBINUR Negative 01/20/2015 1609   KETONESUR NEGATIVE 03/21/2015 1043   KETONESUR Negative 01/20/2015 1609   PROTEINUR 3+* 04/10/2015 1206   PROTEINUR >500* 03/21/2015 1043   PROTEINUR 100 mg/dL 01/20/2015 1609   NITRITE Negative 04/10/2015 1206   NITRITE NEGATIVE 03/21/2015 1043   NITRITE Negative 01/20/2015 1609   LEUKOCYTESUR 3+* 04/10/2015 1206   LEUKOCYTESUR 2+* 03/21/2015 1043   LEUKOCYTESUR 2+ 01/20/2015 1609       Cystoscopy Procedure Note  Patient identification was confirmed, informed consent was obtained, and patient was prepped using Betadine solution.  Lidocaine jelly was administered per urethral meatus.    Preoperative abx where received prior to procedure.     Pre-Procedure: - Inspection reveals a normal caliber ureteral meatus.  Procedure: The flexible cystoscope was introduced without difficulty - No urethral strictures/lesions are present. - atrophic  prostate  - Normal bladder neck - Bilateral ureteral orifices identified - Bladder mucosa  reveals no ulcers, tumors, or lesions - No bladder stones - No trabeculation - significant mucosla debris that was irrigated in 100cc aliquots x 3 to allow visualizatoin.  Retroflexion shows no additional findings   Post-Procedure: - Patient tolerated the procedure well   Assessment & Plan:    1 - Metastatic Prostate Cancer - under good  control by PSA markers on [facility] therapy.   2. Urethral drainage - from scan UOP / bladder mucosal slouging. No indicaitons for any sort of treatment or irrigation regimen.   3. History of bladder cancer - up to date on surveillance  as of today. Yearly cysto surveillance reccomended going forward.   4. Bilateral hydronephrosis - no recurrent UTI or pain. Agree with non-stenting as has ESRD and further "treatment" would likely be more risk than benefit.  5 - RTC 1 year for cysto.   No Follow-up on file.  Alexis Frock, [facility] [address] [place], [place] [address] [phone]

## 2016-02-13 LAB — CULTURE, FUNGUS WITHOUT SMEAR

## 2016-03-12 ENCOUNTER — Other Ambulatory Visit: Payer: Self-pay

## 2016-03-12 ENCOUNTER — Inpatient Hospital Stay
Admission: EM | Admit: 2016-03-12 | Discharge: 2016-03-14 | DRG: 291 | Disposition: A | Discharge status: Skilled Nursing Facility | Payer: Medicare Other | Attending: Internal Medicine | Admitting: Internal Medicine

## 2016-03-12 ENCOUNTER — Emergency Department: Payer: Medicare Other

## 2016-03-12 DIAGNOSIS — I480 Paroxysmal atrial fibrillation: Secondary | ICD-10-CM | POA: Diagnosis present

## 2016-03-12 DIAGNOSIS — Z7951 Long term (current) use of inhaled steroids: Secondary | ICD-10-CM | POA: Diagnosis not present

## 2016-03-12 DIAGNOSIS — R34 Anuria and oliguria: Secondary | ICD-10-CM | POA: Diagnosis present

## 2016-03-12 DIAGNOSIS — I5042 Chronic combined systolic (congestive) and diastolic (congestive) heart failure: Secondary | ICD-10-CM | POA: Diagnosis present

## 2016-03-12 DIAGNOSIS — I501 Left ventricular failure: Secondary | ICD-10-CM | POA: Diagnosis present

## 2016-03-12 DIAGNOSIS — D631 Anemia in chronic kidney disease: Secondary | ICD-10-CM | POA: Diagnosis present

## 2016-03-12 DIAGNOSIS — E46 Unspecified protein-calorie malnutrition: Secondary | ICD-10-CM | POA: Diagnosis present

## 2016-03-12 DIAGNOSIS — J9621 Acute and chronic respiratory failure with hypoxia: Secondary | ICD-10-CM | POA: Diagnosis present

## 2016-03-12 DIAGNOSIS — I132 Hypertensive heart and chronic kidney disease with heart failure and with stage 5 chronic kidney disease, or end stage renal disease: Principal | ICD-10-CM | POA: Diagnosis present

## 2016-03-12 DIAGNOSIS — Z880 Allergy status to penicillin: Secondary | ICD-10-CM

## 2016-03-12 DIAGNOSIS — Z6828 Body mass index (BMI) 28.0-28.9, adult: Secondary | ICD-10-CM | POA: Diagnosis not present

## 2016-03-12 DIAGNOSIS — F419 Anxiety disorder, unspecified: Secondary | ICD-10-CM | POA: Diagnosis present

## 2016-03-12 DIAGNOSIS — K5900 Constipation, unspecified: Secondary | ICD-10-CM | POA: Diagnosis not present

## 2016-03-12 DIAGNOSIS — I714 Abdominal aortic aneurysm, without rupture: Secondary | ICD-10-CM | POA: Diagnosis present

## 2016-03-12 DIAGNOSIS — E785 Hyperlipidemia, unspecified: Secondary | ICD-10-CM | POA: Diagnosis present

## 2016-03-12 DIAGNOSIS — N186 End stage renal disease: Secondary | ICD-10-CM | POA: Diagnosis present

## 2016-03-12 DIAGNOSIS — Z79891 Long term (current) use of opiate analgesic: Secondary | ICD-10-CM | POA: Diagnosis not present

## 2016-03-12 DIAGNOSIS — Z992 Dependence on renal dialysis: Secondary | ICD-10-CM | POA: Diagnosis not present

## 2016-03-12 DIAGNOSIS — Z8551 Personal history of malignant neoplasm of bladder: Secondary | ICD-10-CM

## 2016-03-12 DIAGNOSIS — Z87891 Personal history of nicotine dependence: Secondary | ICD-10-CM | POA: Diagnosis not present

## 2016-03-12 DIAGNOSIS — N2581 Secondary hyperparathyroidism of renal origin: Secondary | ICD-10-CM | POA: Diagnosis present

## 2016-03-12 DIAGNOSIS — E7800 Pure hypercholesterolemia, unspecified: Secondary | ICD-10-CM | POA: Diagnosis present

## 2016-03-12 DIAGNOSIS — K219 Gastro-esophageal reflux disease without esophagitis: Secondary | ICD-10-CM | POA: Diagnosis present

## 2016-03-12 DIAGNOSIS — Z8546 Personal history of malignant neoplasm of prostate: Secondary | ICD-10-CM

## 2016-03-12 DIAGNOSIS — J441 Chronic obstructive pulmonary disease with (acute) exacerbation: Secondary | ICD-10-CM | POA: Diagnosis present

## 2016-03-12 DIAGNOSIS — Z79899 Other long term (current) drug therapy: Secondary | ICD-10-CM | POA: Diagnosis not present

## 2016-03-12 DIAGNOSIS — Z823 Family history of stroke: Secondary | ICD-10-CM

## 2016-03-12 DIAGNOSIS — F0390 Unspecified dementia without behavioral disturbance: Secondary | ICD-10-CM | POA: Diagnosis present

## 2016-03-12 DIAGNOSIS — I5023 Acute on chronic systolic (congestive) heart failure: Secondary | ICD-10-CM

## 2016-03-12 DIAGNOSIS — Z8673 Personal history of transient ischemic attack (TIA), and cerebral infarction without residual deficits: Secondary | ICD-10-CM

## 2016-03-12 LAB — CBC WITH DIFFERENTIAL/PLATELET
BASOS PCT: 0 %
Basophils Absolute: 0 10*3/uL (ref 0–0.1)
EOS ABS: 0.2 10*3/uL (ref 0–0.7)
EOS PCT: 3 %
HCT: 26.1 % — ABNORMAL LOW (ref 40.0–52.0)
Hemoglobin: 8.3 g/dL — ABNORMAL LOW (ref 13.0–18.0)
Lymphocytes Relative: 9 %
Lymphs Abs: 0.8 10*3/uL — ABNORMAL LOW (ref 1.0–3.6)
MCH: 27.7 pg (ref 26.0–34.0)
MCHC: 31.6 g/dL — AB (ref 32.0–36.0)
MCV: 87.5 fL (ref 80.0–100.0)
MONO ABS: 0.7 10*3/uL (ref 0.2–1.0)
MONOS PCT: 9 %
Neutro Abs: 6.8 10*3/uL — ABNORMAL HIGH (ref 1.4–6.5)
Neutrophils Relative %: 79 %
PLATELETS: 224 10*3/uL (ref 150–440)
RBC: 2.99 MIL/uL — ABNORMAL LOW (ref 4.40–5.90)
RDW: 18 % — AB (ref 11.5–14.5)
WBC: 8.6 10*3/uL (ref 3.8–10.6)

## 2016-03-12 LAB — BLOOD GAS, ARTERIAL
Acid-Base Excess: 11.7 mmol/L — ABNORMAL HIGH (ref 0.0–3.0)
Allens test (pass/fail): POSITIVE — AB
Bicarbonate: 34.9 mEq/L — ABNORMAL HIGH (ref 21.0–28.0)
FIO2: 0.38
O2 SAT: 95.1 %
PCO2 ART: 39 mmHg (ref 32.0–48.0)
Patient temperature: 37
pH, Arterial: 7.56 — ABNORMAL HIGH (ref 7.350–7.450)
pO2, Arterial: 65 mmHg — ABNORMAL LOW (ref 83.0–108.0)

## 2016-03-12 LAB — COMPREHENSIVE METABOLIC PANEL
ALBUMIN: 2.8 g/dL — AB (ref 3.5–5.0)
ALT: 8 U/L — ABNORMAL LOW (ref 17–63)
ANION GAP: 9 (ref 5–15)
AST: 16 U/L (ref 15–41)
Alkaline Phosphatase: 169 U/L — ABNORMAL HIGH (ref 38–126)
BILIRUBIN TOTAL: 0.9 mg/dL (ref 0.3–1.2)
BUN: 37 mg/dL — ABNORMAL HIGH (ref 6–20)
CO2: 32 mmol/L (ref 22–32)
Calcium: 8.2 mg/dL — ABNORMAL LOW (ref 8.9–10.3)
Chloride: 97 mmol/L — ABNORMAL LOW (ref 101–111)
Creatinine, Ser: 3.49 mg/dL — ABNORMAL HIGH (ref 0.61–1.24)
GFR calc Af Amer: 18 mL/min — ABNORMAL LOW (ref >60)
GFR calc non Af Amer: 16 mL/min — ABNORMAL LOW (ref >60)
GLUCOSE: 128 mg/dL — AB (ref 65–99)
POTASSIUM: 3.3 mmol/L — AB (ref 3.5–5.1)
Sodium: 138 mmol/L (ref 135–145)
TOTAL PROTEIN: 6.6 g/dL (ref 6.5–8.1)

## 2016-03-12 LAB — BRAIN NATRIURETIC PEPTIDE: B Natriuretic Peptide: >4500 pg/mL — ABNORMAL HIGH (ref 0.0–100.0)

## 2016-03-12 LAB — MRSA PCR SCREENING: MRSA BY PCR: NEGATIVE

## 2016-03-12 LAB — TROPONIN I: Troponin I: <0.03 ng/mL (ref <0.031)

## 2016-03-12 MED ORDER — ALBUTEROL SULFATE (2.5 MG/3ML) 0.083% IN NEBU
2.5000 mg | INHALATION_SOLUTION | RESPIRATORY_TRACT | Status: DC | PRN
  Administered 2016-03-13 – 2016-03-14 (×4): 2.5 mg via RESPIRATORY_TRACT
  Filled 2016-03-12 (×4): qty 3

## 2016-03-12 MED ORDER — ONDANSETRON HCL 4 MG/2ML IJ SOLN: 4.0000 mg | Freq: Four times a day (QID) | INTRAMUSCULAR | Status: DC | PRN

## 2016-03-12 MED ORDER — OXYCODONE HCL 5 MG PO TABS
5.0000 mg | ORAL_TABLET | Freq: Two times a day (BID) | ORAL | Status: DC | PRN
Start: 2016-03-12 — End: 2016-03-14
  Administered 2016-03-13: 5 mg via ORAL
  Filled 2016-03-12: qty 1

## 2016-03-12 MED ORDER — HEPARIN SODIUM (PORCINE) 5000 UNIT/ML IJ SOLN
5000.0000 [IU] | Freq: Three times a day (TID) | INTRAMUSCULAR | Status: DC
  Administered 2016-03-12 – 2016-03-14 (×4): 5000 [IU] via SUBCUTANEOUS
  Filled 2016-03-12 (×5): qty 1

## 2016-03-12 MED ORDER — ACETAMINOPHEN 650 MG RE SUPP: 650.0000 mg | Freq: Four times a day (QID) | RECTAL | Status: DC | PRN

## 2016-03-12 MED ORDER — SODIUM CHLORIDE 0.9% FLUSH
3.0000 mL | Freq: Two times a day (BID) | INTRAVENOUS | Status: DC
  Administered 2016-03-12 – 2016-03-14 (×5): 3 mL via INTRAVENOUS

## 2016-03-12 MED ORDER — FERROUS SULFATE 325 (65 FE) MG PO TABS
325.0000 mg | ORAL_TABLET | Freq: Every day | ORAL | Status: DC
  Administered 2016-03-13 – 2016-03-14 (×2): 325 mg via ORAL
  Filled 2016-03-12 (×2): qty 1

## 2016-03-12 MED ORDER — MAGNESIUM OXIDE 400 (241.3 MG) MG PO TABS
400.0000 mg | ORAL_TABLET | Freq: Every evening | ORAL | Status: DC
Start: 2016-03-12 — End: 2016-03-14
  Administered 2016-03-12 – 2016-03-14 (×3): 400 mg via ORAL
  Filled 2016-03-12 (×3): qty 1

## 2016-03-12 MED ORDER — TIOTROPIUM BROMIDE MONOHYDRATE 18 MCG IN CAPS
18.0000 ug | ORAL_CAPSULE | Freq: Every day | RESPIRATORY_TRACT | Status: DC
  Administered 2016-03-12 – 2016-03-14 (×3): 18 ug via RESPIRATORY_TRACT
  Filled 2016-03-12: qty 5

## 2016-03-12 MED ORDER — LORAZEPAM 0.5 MG PO TABS
0.5000 mg | ORAL_TABLET | ORAL | Status: DC | PRN
  Administered 2016-03-13: 0.5 mg via ORAL
  Filled 2016-03-12: qty 1

## 2016-03-12 MED ORDER — ATORVASTATIN CALCIUM 20 MG PO TABS
40.0000 mg | ORAL_TABLET | Freq: Every day | ORAL | Status: DC
  Administered 2016-03-12 – 2016-03-13 (×2): 40 mg via ORAL
  Filled 2016-03-12 (×2): qty 2

## 2016-03-12 MED ORDER — AMIODARONE HCL 200 MG PO TABS
200.0000 mg | ORAL_TABLET | Freq: Every day | ORAL | Status: DC
  Administered 2016-03-13 – 2016-03-14 (×2): 200 mg via ORAL
  Filled 2016-03-12 (×2): qty 1

## 2016-03-12 MED ORDER — MOMETASONE FURO-FORMOTEROL FUM 200-5 MCG/ACT IN AERO
2.0000 | INHALATION_SPRAY | Freq: Two times a day (BID) | RESPIRATORY_TRACT | Status: DC
  Administered 2016-03-12 – 2016-03-14 (×4): 2 via RESPIRATORY_TRACT
  Filled 2016-03-12: qty 8.8

## 2016-03-12 MED ORDER — ONDANSETRON HCL 4 MG PO TABS: 4.0000 mg | ORAL_TABLET | Freq: Four times a day (QID) | ORAL | Status: DC | PRN

## 2016-03-12 MED ORDER — POLYETHYLENE GLYCOL 3350 17 G PO PACK
17.0000 g | PACK | Freq: Every day | ORAL | Status: DC | PRN
Start: 2016-03-12 — End: 2016-03-14
  Administered 2016-03-13: 17 g via ORAL
  Filled 2016-03-12 (×2): qty 1

## 2016-03-12 MED ORDER — IPRATROPIUM-ALBUTEROL 0.5-2.5 (3) MG/3ML IN SOLN
3.0000 mL | Freq: Three times a day (TID) | RESPIRATORY_TRACT | Status: DC
  Administered 2016-03-12 – 2016-03-14 (×5): 3 mL via RESPIRATORY_TRACT
  Filled 2016-03-12 (×6): qty 3

## 2016-03-12 MED ORDER — METHYLPREDNISOLONE SODIUM SUCC 125 MG IJ SOLR
60.0000 mg | Freq: Two times a day (BID) | INTRAMUSCULAR | Status: DC
  Administered 2016-03-12 – 2016-03-14 (×4): 60 mg via INTRAVENOUS
  Filled 2016-03-12 (×4): qty 2

## 2016-03-12 MED ORDER — GUAIFENESIN 100 MG/5ML PO SOLN: 200.0000 mg | ORAL | Status: DC | PRN

## 2016-03-12 MED ORDER — IPRATROPIUM BROMIDE 0.02 % IN SOLN
0.5000 mg | Freq: Once | RESPIRATORY_TRACT | Status: AC
  Administered 2016-03-12: 0.5 mg via RESPIRATORY_TRACT
  Filled 2016-03-12: qty 2.5

## 2016-03-12 MED ORDER — GUAIFENESIN ER 600 MG PO TB12
600.0000 mg | ORAL_TABLET | Freq: Two times a day (BID) | ORAL | Status: DC
  Administered 2016-03-12 – 2016-03-14 (×4): 600 mg via ORAL
  Filled 2016-03-12 (×4): qty 1

## 2016-03-12 MED ORDER — SEVELAMER CARBONATE 800 MG PO TABS
800.0000 mg | ORAL_TABLET | Freq: Three times a day (TID) | ORAL | Status: DC
  Administered 2016-03-12 – 2016-03-14 (×6): 800 mg via ORAL
  Filled 2016-03-12 (×6): qty 1

## 2016-03-12 MED ORDER — ALBUTEROL SULFATE (2.5 MG/3ML) 0.083% IN NEBU
5.0000 mg | INHALATION_SOLUTION | Freq: Once | RESPIRATORY_TRACT | Status: AC
  Administered 2016-03-12: 5 mg via RESPIRATORY_TRACT
  Filled 2016-03-12: qty 6

## 2016-03-12 MED ORDER — ACETAMINOPHEN 325 MG PO TABS: 650.0000 mg | ORAL_TABLET | Freq: Four times a day (QID) | ORAL | Status: DC | PRN

## 2016-03-12 MED ORDER — AMLODIPINE BESYLATE 5 MG PO TABS
2.5000 mg | ORAL_TABLET | Freq: Every day | ORAL | Status: DC
  Administered 2016-03-13 – 2016-03-14 (×2): 2.5 mg via ORAL
  Filled 2016-03-12 (×2): qty 1

## 2016-03-12 NOTE — ED Provider Notes (Addendum)
CSN: [id number]     Arrival date & time 03/12/16  1100 History   First MD Initiated Contact with Patient 03/12/16 1102     Chief Complaint  Patient presents with  . Shortness of Breath     (Consider location/radiation/quality/duration/timing/severity/associated sxs/prior Treatment) The history is provided by the patient.  [name] is a 75 y.o. male hx of afib not on anticoagulation, CHF with EF 30%, ESRD on HD (last HD today), COPD here with shortness of breath. Short of breath during his dialysis treatment. Denies any chest pain. Dates that he is chronically short of breath and is on 3 L nasal cannula at baseline. Denies any cough or fevers. States that his leg swelling is baseline. On chart review, he has multiple admissions for acute pulmonary edema and have been on BiPAP in the past. EMS states that his oxygen was 92-93% on 3 L and 98% on 5 L. Patient is anuric    Past Medical History  Diagnosis Date  . PAF (paroxysmal atrial fibrillation) ([place])     a. new onset 12/2014 in the setting of UTI, sepsis, hypotension, and anemia; b. not on long term anticoagulation given anemia; c. family aware of stroke risk, they are ok with this; d. on amiodarone   . Chronic combined systolic and diastolic CHF (congestive heart failure) ([place])     a. echo 12/2014: EF 25-30%, anterior wall wall motion abnormalities; b. planned ischemic evaluation once patient is stable medically; c. echo 01/2015: EF 45-50%, no RWMA, mild AT, LA mildly dilated, mod pericardial effusion along LV free wall, no evidence of hemodynamic compromise  . ESRD on hemodialysis ([place])     a. Tuesday, Thursday, and Saturdays  . Anemia     a. baseline hgb ~ 8  . History of small bowel obstruction     a. 01/2015  . History of septic shock     a. 01/2015  . GERD (gastroesophageal reflux disease)   . Allergic rhinitis   . COPD (chronic obstructive pulmonary disease) ([place])   . Hypercholesteremia   . Cognitive communication deficit    . Magnesium deficiency   . Dementia   . 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 Incontinence   . Hydronephrosis   . Overactive bladder   . Detrusor sphincter dyssynergia   . Mini stroke ([place])   . Bladder cancer ([place])   . Prostate cancer ([place])   . Hypertension    Past Surgical History  Procedure Laterality Date  . Cystoscopy w/ ureteral stent placement    . Transurethral resection of bladder tumor with gyrus (turbt-gyrus)    . Ureteral stent placement    . Prostate surgery      Removal  . Av fistula placement      left arm  . Peripheral vascular catheterization N/A 01/24/2016    Procedure: Endovascular Repair/Stent Graft;  Surgeon: Katha Cabal, MD;  Location: [facility];  Service: Cardiovascular;  Laterality: N/A;   Family History  Problem Relation Age of Onset  . Stroke Mother    Social History  Substance Use Topics  . Smoking status: Former Smoker -- 0.25 packs/day for 6 years    Quit date: 05/13/2015  . Smokeless tobacco: Never Used  . Alcohol Use: No    Review of Systems  Respiratory: Positive for shortness of breath.   All other systems reviewed and are negative.     Allergies  Penicillins  Home Medications   Prior to Admission medications  Medication Sig Start Date End Date Taking? Authorizing Provider  acetaminophen (TYLENOL) 325 MG tablet Take 650 mg by mouth every 8 (eight) hours as needed. For general discomfort    Historical Provider, MD  albuterol (PROVENTIL) (2.5 MG/3ML) 0.083% nebulizer solution Take 3 mLs (2.5 mg total) by nebulization every 4 (four) hours as needed for wheezing or shortness of breath. 06/10/15   Fritzi Mandes, MD  amiodarone (PACERONE) 200 MG tablet Take 1 tablet (200 mg total) by mouth daily. 09/13/15   Gladstone Lighter, MD  amLODipine (NORVASC) 2.5 MG tablet Take 2.5 mg by mouth daily.    Historical Provider, MD  atorvastatin (LIPITOR) 40 MG tablet Take 40 mg by mouth at bedtime.    Historical Provider, MD  budesonide-formoterol  (SYMBICORT) 160-4.5 MCG/ACT inhaler Inhale 2 puffs into the lungs 2 (two) times daily.    Historical Provider, MD  ferrous sulfate 325 (65 FE) MG tablet Take 325 mg by mouth daily.    Historical Provider, MD  guaiFENesin (MUCINEX) 600 MG 12 hr tablet Take 1 tablet (600 mg total) by mouth 2 (two) times daily. 09/13/15   Gladstone Lighter, MD  loperamide (IMODIUM A-D) 2 MG tablet Take 2 tablets (4 mg total) by mouth 4 (four) times daily as needed for diarrhea or loose stools. 11/23/15   Carrie Mew, MD  magnesium oxide (MAG-OX) 400 MG tablet Take 1 tablet (400 mg total) by mouth every evening. 09/13/15   Gladstone Lighter, MD  ondansetron (ZOFRAN ODT) 8 MG disintegrating tablet Take 1 tablet (8 mg total) by mouth every 8 (eight) hours as needed for nausea or vomiting. 11/23/15   Carrie Mew, MD  oxyCODONE (OXY IR/ROXICODONE) 5 MG immediate release tablet Take 1 tablet (5 mg total) by mouth every 6 (six) hours as needed for moderate pain or severe pain. 01/29/16   Kimberly A Stegmayer, PA-C  polyethylene glycol (MIRALAX / GLYCOLAX) packet Take 17 g by mouth daily as needed for mild constipation, moderate constipation or severe constipation.    Historical Provider, MD  sevelamer carbonate (RENVELA) 800 MG tablet Take 800 mg by mouth 3 (three) times daily before meals.     Historical Provider, MD  tiotropium (SPIRIVA) 18 MCG inhalation capsule Place 1 capsule (18 mcg total) into inhaler and inhale daily. 09/13/15   Gladstone Lighter, MD   BP 122/68 mmHg  Pulse 78  Temp(Src) 97.8 F (36.6 C) (Oral)  Resp 18  Ht 6\' 2"  (1.88 m)  Wt 220 lb (99.791 kg)  BMI 28.23 kg/m2  SpO2 100% Physical Exam  Constitutional: He is oriented to person, place, and time.  Chronically ill, tachypneic   HENT:  Head: Normocephalic.  Mouth/Throat: Oropharynx is clear and moist.  Eyes: Conjunctivae are normal. Pupils are equal, round, and reactive to light.  Neck: Normal range of motion. Neck supple.  Cardiovascular:  Normal rate, regular rhythm and normal heart sounds.   Pulmonary/Chest:  Diminished bilateral bases, tachypneic.   Abdominal: Soft. Bowel sounds are normal. He exhibits no distension. There is no tenderness. There is no rebound.  Musculoskeletal: Normal range of motion.  1+ edema bilaterally   Neurological: He is alert and oriented to person, place, and time.  Skin: Skin is warm and dry.  Psychiatric: He has a normal mood and affect. His behavior is normal. Judgment and thought content normal.  Nursing note and vitals reviewed.   ED Course  Procedures (including critical care time) Labs Review Labs Reviewed  CBC WITH DIFFERENTIAL/PLATELET - Abnormal; Notable for the  following:    RBC 2.99 (*)    Hemoglobin 8.3 (*)    HCT 26.1 (*)    MCHC 31.6 (*)    RDW 18.0 (*)    Neutro Abs 6.8 (*)    Lymphs Abs 0.8 (*)    All other components within normal limits  COMPREHENSIVE METABOLIC PANEL - Abnormal; Notable for the following:    Potassium 3.3 (*)    Chloride 97 (*)    Glucose, Bld 128 (*)    BUN 37 (*)    Creatinine, Ser 3.49 (*)    Calcium 8.2 (*)    Albumin 2.8 (*)    ALT 8 (*)    Alkaline Phosphatase 169 (*)    GFR calc non Af Amer 16 (*)    GFR calc Af Amer 18 (*)    All other components within normal limits  BLOOD GAS, ARTERIAL - Abnormal; Notable for the following:    pH, Arterial 7.56 (*)    pO2, Arterial 65 (*)    Bicarbonate 34.9 (*)    Acid-Base Excess 11.7 (*)    Allens test (pass/fail) POSITIVE (*)    All other components within normal limits  TROPONIN I  BRAIN NATRIURETIC PEPTIDE    Imaging Review Dg Chest Port 1 View  03/12/2016  CLINICAL DATA:  Shortness of breath during dialysis. EXAM: PORTABLE CHEST 1 VIEW COMPARISON:  01/24/2016 FINDINGS: Cardiomediastinal silhouette is enlarged. Mediastinal contours appear intact. There is no evidence of pneumothorax. There is pulmonary vascular congestion. Bilateral pleural effusions are noted, the larger on the left.  Left lower lobe airspace consolidation or segmental atelectasis cannot be excluded. Osseous structures are without acute abnormality. Soft tissues are grossly normal. IMPRESSION: Enlarged cardiac silhouette with interstitial pulmonary edema and bilateral pleural effusions. Superimposed left lower lobe airspace consolidation or atelectasis cannot be excluded. Electronically Signed   By: Fidela Salisbury M.D.   On: 03/12/2016 11:39   I have personally reviewed and evaluated these images and lab results as part of my medical decision-making.   EKG Interpretation None       ED ECG REPORT I, YAO, DAVID, the attending physician, personally viewed and interpreted this ECG.   Date: 03/12/2016  EKG Time: 11:09 am  Rate: 80  Rhythm: normal EKG, normal sinus rhythm  Axis: normal  Intervals:none  ST&T Change: nonspecific    MDM   Final diagnoses:  None   [name] is a 74 y.o. male here with shortness of breath. Likely acute pulmonary edema vs COPD. Will check labs, BNP, CXR. Will give neb and reassess.    12:41 PM CXR showed pulmonary edema, ? Infiltrate. WBC nl. ABG reassuring. Given hx of pulmonary edema requiring bipap, will admit for obs. He is anuric so will hold off on lasix. Called nephrology to dialyze patient more today.    Wandra Arthurs, MD 03/12/16 1242  Wandra Arthurs, MD 03/12/16 [phone]

## 2016-03-12 NOTE — Progress Notes (Signed)
Patient has decided for cousin [name] to be his Power of [employer]. However, we do not have the Advance Directive on file that the patient and cousin claim to have filled out already. Chaplain provided all the information and explained the legal documentation and process. [name] said she would make a copy and bring it back to the hospital. Chaplain team will remain ready to complete an Advance Directive if the patient and cousin and cannot provide the necessary information. Patient and cousin [name] also would like to speak to the Physician to discuss the process, information and possibility of needing a DNR in place if Jabbar's health continues to be a issue and or declines.

## 2016-03-12 NOTE — H&P (Signed)
[facility] NAME: [name]    MR#:  [id number]  DATE OF BIRTH:  [birth year]  DATE OF ADMISSION:  03/12/2016  PRIMARY CARE PHYSICIAN: Dion Body, MD   REQUESTING/REFERRING PHYSICIAN: Dr. Shirlyn Goltz  CHIEF COMPLAINT:   Chief Complaint  Patient presents with  . Shortness of Breath    HISTORY OF PRESENT ILLNESS:  [name]  is a 75 y.o. male with a known history of End-stage renal disease and hemodialysis, paroxysmal atrial fibrillation, chronic combined systolic and diastolic CHF, chronic anemia, GERD, history of previous CVA, dementia, secondary hyperparathyroidism, who presents to the hospital from dialysis complaining of some shortness of breath. Patient says that he is normally on a short of breath given his COPD but today during dialysis he felt more short of breath than usual. He had almost finished his dialysis treatment but had to be sent to the ER for further evaluation due to worsening shortness of breath. In the emergency room patient was noted to have increasing O2 requirements from 2 L to 5 L and his chest x-ray findings are suggestive of mild pulmonary edema and volume overload. He denies any chest pain, nausea, vomiting, palpitations, abdominal pain or any other associated symptoms. Given his chest x-ray findings and worsening hypoxemia hospitalist services were contacted further treatment and evaluation.  PAST MEDICAL HISTORY:   Past Medical History  Diagnosis Date  . PAF (paroxysmal atrial fibrillation) ([place])     a. new onset 12/2014 in the setting of UTI, sepsis, hypotension, and anemia; b. not on long term anticoagulation given anemia; c. family aware of stroke risk, they are ok with this; d. on amiodarone   . Chronic combined systolic and diastolic CHF (congestive heart failure) ([place])     a. echo 12/2014: EF 25-30%, anterior wall wall motion abnormalities; b. planned ischemic evaluation once patient is  stable medically; c. echo 01/2015: EF 45-50%, no RWMA, mild AT, LA mildly dilated, mod pericardial effusion along LV free wall, no evidence of hemodynamic compromise  . ESRD on hemodialysis ([place])     a. Tuesday, Thursday, and Saturdays  . Anemia     a. baseline hgb ~ 8  . History of small bowel obstruction     a. 01/2015  . History of septic shock     a. 01/2015  . GERD (gastroesophageal reflux disease)   . Allergic rhinitis   . COPD (chronic obstructive pulmonary disease) ([place])   . Hypercholesteremia   . Cognitive communication deficit   . Magnesium deficiency   . Dementia   . 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 Incontinence   . Hydronephrosis   . Overactive bladder   . Detrusor sphincter dyssynergia   . Mini stroke ([place])   . Bladder cancer ([place])   . Prostate cancer ([place])   . Hypertension     PAST SURGICAL HISTORY:   Past Surgical History  Procedure Laterality Date  . Cystoscopy w/ ureteral stent placement    . Transurethral resection of bladder tumor with gyrus (turbt-gyrus)    . Ureteral stent placement    . Prostate surgery      Removal  . Av fistula placement      left arm  . Peripheral vascular catheterization N/A 01/24/2016    Procedure: Endovascular Repair/Stent Graft;  Surgeon: Katha Cabal, MD;  Location: [facility];  Service: Cardiovascular;  Laterality: N/A;    SOCIAL HISTORY:   Social History  Substance Use  Topics  . Smoking status: Former Smoker -- 0.25 packs/day for 60 years    Quit date: 05/13/2015  . Smokeless tobacco: Never Used  . Alcohol Use: No    FAMILY HISTORY:   Family History  Problem Relation Age of Onset  . Stroke Mother     DRUG ALLERGIES:   Allergies  Allergen Reactions  . Penicillins Hives, Itching and Swelling    Has patient had a PCN reaction causing immediate rash, facial/tongue/throat swelling, SOB or lightheadedness with hypotension: Yes Has patient had a PCN reaction causing severe rash involving mucus membranes or  skin necrosis: No Has patient had a PCN reaction that required hospitalization No Has patient had a PCN reaction occurring within the last 10 years: No If all of the above answers are "NO", then may proceed with Cephalosporin use.    REVIEW OF SYSTEMS:   Review of Systems  Constitutional: Negative for fever and weight loss.  HENT: Negative for congestion, nosebleeds and tinnitus.   Eyes: Negative for blurred vision, double vision and redness.  Respiratory: Positive for shortness of breath. Negative for cough and hemoptysis.   Cardiovascular: Negative for chest pain, orthopnea, leg swelling and PND.  Gastrointestinal: Negative for nausea, vomiting, abdominal pain, diarrhea and melena.  Genitourinary: Negative for dysuria, urgency and hematuria.  Musculoskeletal: Negative for joint pain and falls.  Neurological: Negative for dizziness, tingling, sensory change, focal weakness, seizures, weakness and headaches.  Endo/Heme/Allergies: Negative for polydipsia. Does not bruise/bleed easily.  Psychiatric/Behavioral: Negative for depression and memory loss. The patient is not nervous/anxious.     MEDICATIONS AT HOME:   Prior to Admission medications   Medication Sig Start Date End Date Taking? Authorizing Provider  acetaminophen (TYLENOL) 325 MG tablet Take 650 mg by mouth every 8 (eight) hours as needed. For general discomfort   Yes Historical Provider, MD  albuterol (PROVENTIL) (2.5 MG/3ML) 0.083% nebulizer solution Take 3 mLs (2.5 mg total) by nebulization every 4 (four) hours as needed for wheezing or shortness of breath. 06/10/15  Yes Fritzi Mandes, MD  amiodarone (PACERONE) 200 MG tablet Take 1 tablet (200 mg total) by mouth daily. 09/13/15  Yes Gladstone Lighter, MD  amLODipine (NORVASC) 2.5 MG tablet Take 2.5 mg by mouth daily.   Yes Historical Provider, MD  atorvastatin (LIPITOR) 40 MG tablet Take 40 mg by mouth at bedtime.   Yes Historical Provider, MD  budesonide-formoterol (SYMBICORT)  160-4.5 MCG/ACT inhaler Inhale 2 puffs into the lungs 2 (two) times daily.   Yes Historical Provider, MD  ferrous sulfate 325 (65 FE) MG tablet Take 325 mg by mouth daily.   Yes Historical Provider, MD  guaiFENesin (MUCINEX) 600 MG 12 hr tablet Take 600 mg by mouth 2 (two) times daily.   Yes Historical Provider, MD  guaifenesin (ROBITUSSIN) 100 MG/5ML syrup Take 200 mg by mouth every 4 (four) hours as needed for cough.   Yes Historical Provider, MD  ipratropium-albuterol (DUONEB) 0.5-2.5 (3) MG/3ML SOLN Take 3 mLs by nebulization 3 (three) times daily.   Yes Historical Provider, MD  LORazepam (ATIVAN) 0.5 MG tablet Take 0.5 mg by mouth every 4 (four) hours as needed for anxiety.   Yes Historical Provider, MD  magnesium oxide (MAG-OX) 400 MG tablet Take 1 tablet (400 mg total) by mouth every evening. 09/13/15  Yes Gladstone Lighter, MD  oxyCODONE (OXY IR/ROXICODONE) 5 MG immediate release tablet Take 1 tablet (5 mg total) by mouth every 6 (six) hours as needed for moderate pain or severe pain. Patient  taking differently: Take 5 mg by mouth every 12 (twelve) hours as needed for moderate pain or severe pain.  01/29/16  Yes Kimberly A Stegmayer, PA-C  polyethylene glycol (MIRALAX / GLYCOLAX) packet Take 17 g by mouth daily as needed for mild constipation, moderate constipation or severe constipation.   Yes Historical Provider, MD  sevelamer carbonate (RENVELA) 800 MG tablet Take 800 mg by mouth 3 (three) times daily before meals.    Yes Historical Provider, MD  tiotropium (SPIRIVA) 18 MCG inhalation capsule Place 1 capsule (18 mcg total) into inhaler and inhale daily. 09/13/15  Yes Gladstone Lighter, MD  loperamide (IMODIUM A-D) 2 MG tablet Take 2 tablets (4 mg total) by mouth 4 (four) times daily as needed for diarrhea or loose stools. Patient not taking: Reported on 03/12/2016 11/23/15   Carrie Mew, MD  ondansetron (ZOFRAN ODT) 8 MG disintegrating tablet Take 1 tablet (8 mg total) by mouth every 8  (eight) hours as needed for nausea or vomiting. Patient not taking: Reported on 03/12/2016 11/23/15   Carrie Mew, MD      VITAL SIGNS:  Blood pressure 139/79, pulse 76, temperature 97.8 F (36.6 C), temperature source Oral, resp. rate 16, height 6\' 2"  (1.88 m), weight 99.791 kg (220 lb), SpO2 100 %.  PHYSICAL EXAMINATION:  Physical Exam  GENERAL:  75 y.o.-year-old patient lying in the bed slightly tachypnic and in mild resp. Distress.  EYES: Pupils equal, round, reactive to light and accommodation. No scleral icterus. Extraocular muscles intact.  HEENT: Head atraumatic, normocephalic. Oropharynx and nasopharynx clear. No oropharyngeal erythema, moist oral mucosa  NECK:  Supple, no jugular venous distention. No thyroid enlargement, no tenderness.  LUNGS: Normal breath sounds bilaterally, no wheezing, bibasirales, No rhonchi. No use of accessory muscles of respiration.  CARDIOVASCULAR: S1, S2 RRR. No murmurs, rubs, gallops, clicks.  ABDOMEN: Soft, nontender, nondistended. Bowel sounds present. No organomegaly or mass.  EXTREMITIES: No pedal edema, cyanosis, or clubbing. + 2 pedal & radial pulses b/l.   NEUROLOGIC: Cranial nerves II through XII are intact. No focal Motor or sensory deficits appreciated b/l PSYCHIATRIC: The patient is alert and oriented x 3. Good affect.  SKIN: No obvious rash, lesion, or ulcer.   LABORATORY PANEL:   CBC  Recent Labs Lab 03/12/16 1137  WBC 8.6  HGB 8.3*  HCT 26.1*  PLT 224   ------------------------------------------------------------------------------------------------------------------  Chemistries   Recent Labs Lab 03/12/16 1137  NA 138  K 3.3*  CL 97*  CO2 32  GLUCOSE 128*  BUN 37*  CREATININE 3.49*  CALCIUM 8.2*  AST 16  ALT 8*  ALKPHOS 169*  BILITOT 0.9   ------------------------------------------------------------------------------------------------------------------  Cardiac Enzymes  Recent Labs Lab 03/12/16 1137   TROPONINI <0.03   ------------------------------------------------------------------------------------------------------------------  RADIOLOGY:  Dg Chest Port 1 View  03/12/2016  CLINICAL DATA:  Shortness of breath during dialysis. EXAM: PORTABLE CHEST 1 VIEW COMPARISON:  01/24/2016 FINDINGS: Cardiomediastinal silhouette is enlarged. Mediastinal contours appear intact. There is no evidence of pneumothorax. There is pulmonary vascular congestion. Bilateral pleural effusions are noted, the larger on the left. Left lower lobe airspace consolidation or segmental atelectasis cannot be excluded. Osseous structures are without acute abnormality. Soft tissues are grossly normal. IMPRESSION: Enlarged cardiac silhouette with interstitial pulmonary edema and bilateral pleural effusions. Superimposed left lower lobe airspace consolidation or atelectasis cannot be excluded. Electronically Signed   By: Fidela Salisbury M.D.   On: 03/12/2016 11:39     IMPRESSION AND PLAN:   75 year old male with past medical  history of end-stage renal disease on hemodialysis, history of combined systolic diastolic CHF, chronic anemia, hypertension, secondary hyperparathyroidism, dementia, GERD who presents to the hospital due to shortness of breath and noted to be in acute on chronic hypoxic respiratory failure.  1. Acute on chronic respiratory failure with hypoxia-this is secondary to CHF/mild pulmonary edema. -Patient is on baseline oxygen about 2-3 L currently up to 5. This is likely secondary to pulmonary edema and patient will get extra dialysis to get fluid removed. -Wean O2 as tolerated.  2. End-stage renal disease on hemodialysis-patient gets dialysis on Tuesday and Thursday and Saturday. -Patient will get extra dialysis given his volume overload. [facility] consult nephrology.  3. History of paroxysmal fibrillation-currently rate controlled. Continue amiodarone.  4. COPD-no acute exacerbation-continue Dulera,  Spiriva.  5. Anxiety-continue Ativan as needed.  6. Secondary hyperparathyroidism-continue Renvela.  7. Essential hypertension-continue Norvasc.  8. Hyperlipidemia-continue atorvastatin.    All the records are reviewed and case discussed with ED provider. Management plans discussed with the patient, family and they are in agreement.  CODE STATUS: Full  TOTAL TIME TAKING CARE OF THIS PATIENT: 45 minutes.    Henreitta Leber M.D on 03/12/2016 at 2:44 PM  Between 7am to 6pm - Pager - [phone]  After 6pm go to [email or web address] - password [email or web address] [facility]  Office  [phone]  CC: Primary care physician; Dion Body, MD

## 2016-03-12 NOTE — Progress Notes (Signed)
Pt is in stable condition at this time. Tolerating meals well. No complaints of SOB on my shift. Pt remains on 4 L O2. Report given to Crystal. [name] to bring Advance directives tomorrow.

## 2016-03-12 NOTE — Progress Notes (Addendum)
[facility]  ROUNDING NOTE   Subjective:   Admitted for shortness of breath. Came off dialysis 30 minutes early.   Objective:  Vital signs in last 24 hours:  Temp:  [97.8 F (36.6 C)] 97.8 F (36.6 C) (06/20 1535) Pulse Rate:  [76-126] 76 (06/20 1535) Resp:  [16-39] 17 (06/20 1535) BP: (120-139)/(68-85) 120/75 mmHg (06/20 1535) SpO2:  [94 %-100 %] 100 % (06/20 1535) Weight:  [99.791 kg (220 lb)] 99.791 kg (220 lb) (06/20 1110)  Weight change:  Filed Weights   03/12/16 1110  Weight: 99.791 kg (220 lb)    Intake/Output:     Intake/Output this shift:     Physical Exam: General: NAD, laying in bed  Head: Normocephalic, atraumatic. Moist oral mucosal membranes  Eyes: Anicteric, PERRL  Neck: Supple, trachea midline  Lungs:  Clear to auscultation, 3 Litres [place]  Heart: Regular rate and rhythm  Abdomen:  Soft, nontender,   Extremities:  no peripheral edema.  Neurologic: Nonfocal, moving all four extremities  Skin: No lesions  Access: Left arm AVF    Basic Metabolic Panel:  Recent Labs Lab 03/12/16 1137  NA 138  K 3.3*  CL 97*  CO2 32  GLUCOSE 128*  BUN 37*  CREATININE 3.49*  CALCIUM 8.2*    Liver Function Tests:  Recent Labs Lab 03/12/16 1137  AST 16  ALT 8*  ALKPHOS 169*  BILITOT 0.9  PROT 6.6  ALBUMIN 2.8*   No results for input(s): LIPASE, AMYLASE in the last 168 hours. No results for input(s): AMMONIA in the last 168 hours.  CBC:  Recent Labs Lab 03/12/16 1137  WBC 8.6  NEUTROABS 6.8*  HGB 8.3*  HCT 26.1*  MCV 87.5  PLT 224    Cardiac Enzymes:  Recent Labs Lab 03/12/16 1137  TROPONINI <0.03    BNP: Invalid input(s): POCBNP  CBG: No results for input(s): GLUCAP in the last 168 hours.  Microbiology: Results for orders placed or performed during the hospital encounter of 01/23/16  Culture, fungus without smear ([facility]-Only)     Status: None   Collection Time: 01/23/16 10:03 PM  Result Value Ref Range Status    Specimen Description CATH TIP  Final   Special Requests NONE  Final   Culture NO FUNGUS ISOLATED AFTER 21 DAYS  Final   Report Status 02/13/2016 FINAL  Final  MRSA PCR Screening     Status: None   Collection Time: 01/24/16 12:02 AM  Result Value Ref Range Status   MRSA by PCR NEGATIVE NEGATIVE Final    Comment:        The GeneXpert MRSA Assay (FDA approved for NASAL specimens only), is one component of a comprehensive MRSA colonization surveillance program. It is not intended to diagnose MRSA infection nor to guide or monitor treatment for MRSA infections.     Coagulation Studies: No results for input(s): LABPROT, INR in the last 72 hours.  Urinalysis: No results for input(s): COLORURINE, LABSPEC, PHURINE, GLUCOSEU, HGBUR, BILIRUBINUR, KETONESUR, PROTEINUR, UROBILINOGEN, NITRITE, LEUKOCYTESUR in the last 72 hours.  Invalid input(s): APPERANCEUR    Imaging: Dg Chest Port 1 View  03/12/2016  CLINICAL DATA:  Shortness of breath during dialysis. EXAM: PORTABLE CHEST 1 VIEW COMPARISON:  01/24/2016 FINDINGS: Cardiomediastinal silhouette is enlarged. Mediastinal contours appear intact. There is no evidence of pneumothorax. There is pulmonary vascular congestion. Bilateral pleural effusions are noted, the larger on the left. Left lower lobe airspace consolidation or segmental atelectasis cannot be excluded. Osseous structures are without  acute abnormality. Soft tissues are grossly normal. IMPRESSION: Enlarged cardiac silhouette with interstitial pulmonary edema and bilateral pleural effusions. Superimposed left lower lobe airspace consolidation or atelectasis cannot be excluded. Electronically Signed   By: Fidela Salisbury M.D.   On: 03/12/2016 11:39     Medications:     . amiodarone  200 mg Oral Daily  . amLODipine  2.5 mg Oral Daily  . atorvastatin  40 mg Oral QHS  . ferrous sulfate  325 mg Oral Daily  . guaiFENesin  600 mg Oral BID  . heparin  5,000 Units Subcutaneous Q8H   . ipratropium-albuterol  3 mL Nebulization TID  . magnesium oxide  400 mg Oral QPM  . methylPREDNISolone (SOLU-MEDROL) injection  60 mg Intravenous Q12H  . mometasone-formoterol  2 puff Inhalation BID  . sevelamer carbonate  800 mg Oral TID AC  . sodium chloride flush  3 mL Intravenous Q12H  . tiotropium  18 mcg Inhalation Daily   acetaminophen **OR** acetaminophen, albuterol, guaiFENesin, LORazepam, ondansetron **OR** ondansetron (ZOFRAN) IV, oxyCODONE, polyethylene glycol  Assessment/ Plan:  [name] is a 75 y.o. 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   1. ESRD on HD TTHS: Completed dialysis treatment today. Does not need further dialysis.  - Continue TTS schedule.    2. Anemia of CKD: hemoglobin 8.3 - epogen with HD treatment.  - Fe supplements.   3. SHPTH: PTH at goal as outpatient.  - Continue Renvela for phos binding  4. Hypertension at goal.  - Continue amlodipine.  5. Acute Exacerbation of COPD: with hypoxia.  - Start systemic steroids - Continue Oxygen and nebs.    LOS: 0 [name] 6/20/20173:40 PM

## 2016-03-12 NOTE — ED Notes (Signed)
Pt comes into the ED via EMS from dialysis.. States the pt completed his dialysis treat ment and became SOB.. Pt lives at [facility].. Pt is tachypnic on arrival.. States placed on 3L [place] and pt O2 sats were 92-93, on 5L [place] 94-95% denies any chest pain at present.

## 2016-03-13 ENCOUNTER — Telehealth: Payer: Self-pay

## 2016-03-13 LAB — CBC
HEMATOCRIT: 25.8 % — AB (ref 40.0–52.0)
HEMOGLOBIN: 8.3 g/dL — AB (ref 13.0–18.0)
MCH: 27.5 pg (ref 26.0–34.0)
MCHC: 32.1 g/dL (ref 32.0–36.0)
MCV: 85.6 fL (ref 80.0–100.0)
Platelets: 209 10*3/uL (ref 150–440)
RBC: 3.02 MIL/uL — ABNORMAL LOW (ref 4.40–5.90)
RDW: 18 % — AB (ref 11.5–14.5)
WBC: 4.6 10*3/uL (ref 3.8–10.6)

## 2016-03-13 LAB — BASIC METABOLIC PANEL
ANION GAP: 13 (ref 5–15)
BUN: 57 mg/dL — AB (ref 6–20)
CHLORIDE: 97 mmol/L — AB (ref 101–111)
CO2: 28 mmol/L (ref 22–32)
Calcium: 8.5 mg/dL — ABNORMAL LOW (ref 8.9–10.3)
Creatinine, Ser: 4.48 mg/dL — ABNORMAL HIGH (ref 0.61–1.24)
GFR calc Af Amer: 14 mL/min — ABNORMAL LOW (ref >60)
GFR calc non Af Amer: 12 mL/min — ABNORMAL LOW (ref >60)
GLUCOSE: 158 mg/dL — AB (ref 65–99)
POTASSIUM: 4.2 mmol/L (ref 3.5–5.1)
SODIUM: 138 mmol/L (ref 135–145)

## 2016-03-13 MED ORDER — SENNOSIDES-DOCUSATE SODIUM 8.6-50 MG PO TABS
2.0000 | ORAL_TABLET | Freq: Two times a day (BID) | ORAL | Status: DC
  Administered 2016-03-13: 2 via ORAL
  Filled 2016-03-13 (×2): qty 2

## 2016-03-13 MED ORDER — BISACODYL 10 MG RE SUPP
10.0000 mg | Freq: Every day | RECTAL | Status: DC | PRN
  Administered 2016-03-13: 10 mg via RECTAL
  Filled 2016-03-13: qty 1

## 2016-03-13 MED ORDER — LACTULOSE 10 GM/15ML PO SOLN
20.0000 g | Freq: Every day | ORAL | Status: DC | PRN
  Administered 2016-03-13: 20 g via ORAL
  Filled 2016-03-13: qty 30

## 2016-03-13 NOTE — Progress Notes (Signed)
[name] at [place] NAME: [name]    MR#:  [id number]  DATE OF BIRTH:  12/17/1940  SUBJECTIVE:  CHIEF COMPLAINT:   Chief Complaint  Patient presents with  . Shortness of Breath   - Breathing improved, but still not back to baseline, on 4L home o2 - no dialysis today - complains of constipation  REVIEW OF SYSTEMS:  Review of Systems  Constitutional: Positive for malaise/fatigue. Negative for fever and chills.  HENT: Negative for ear discharge, ear pain and nosebleeds.   Eyes: Negative for blurred vision and double vision.  Respiratory: Positive for shortness of breath. Negative for cough and wheezing.   Cardiovascular: Negative for chest pain, palpitations and leg swelling.  Gastrointestinal: Positive for abdominal pain and constipation. Negative for nausea, vomiting and diarrhea.  Genitourinary: Negative for dysuria and urgency.  Musculoskeletal: Positive for back pain. Negative for myalgias.  Neurological: Negative for dizziness, sensory change, speech change, focal weakness, seizures and headaches.  Psychiatric/Behavioral: Negative for depression.    DRUG ALLERGIES:   Allergies  Allergen Reactions  . Penicillins Hives, Itching and Swelling    Has patient had a PCN reaction causing immediate rash, facial/tongue/throat swelling, SOB or lightheadedness with hypotension: Yes Has patient had a PCN reaction causing severe rash involving mucus membranes or skin necrosis: No Has patient had a PCN reaction that required hospitalization No Has patient had a PCN reaction occurring within the last 10 years: No If all of the above answers are "NO", then may proceed with Cephalosporin use.    VITALS:  Blood pressure 132/86, pulse 80, temperature 97.6 F (36.4 C), temperature source Oral, resp. rate 19, height 6\' 2"  (1.88 m), weight 81.92 kg (180 lb 9.6 oz), SpO2 100 %.  PHYSICAL EXAMINATION:  Physical Exam  GENERAL:  75  y.o.-year-old patient lying in the bed with no acute distress.  EYES: Pupils equal, round, reactive to light and accommodation. No scleral icterus. Extraocular muscles intact.  HEENT: Head atraumatic, normocephalic. Oropharynx and nasopharynx clear.  NECK:  Supple, no jugular venous distention. No thyroid enlargement, no tenderness.  LUNGS: Normal breath sounds bilaterally, no rhonchi or crepitation. No use of accessory muscles of respiration. Scattered wheezing, bibasilar crackles noted. CARDIOVASCULAR: S1, S2 normal. No murmurs, rubs, or gallops.  ABDOMEN: Soft, nontender, discomfort on palpation generalized, nondistended. Bowel sounds present. No organomegaly or mass.  EXTREMITIES: No pedal edema, cyanosis, or clubbing.  NEUROLOGIC: Cranial nerves II through XII are intact. Muscle strength 5/5 in all extremities. Sensation intact. Gait not checked.  PSYCHIATRIC: The patient is alert and oriented x 3.  SKIN: No obvious rash, lesion, or ulcer.    LABORATORY PANEL:   CBC  Recent Labs Lab 03/13/16 0348  WBC 4.6  HGB 8.3*  HCT 25.8*  PLT 209   ------------------------------------------------------------------------------------------------------------------  Chemistries   Recent Labs Lab 03/12/16 1137 03/13/16 0348  NA 138 138  K 3.3* 4.2  CL 97* 97*  CO2 32 28  GLUCOSE 128* 158*  BUN 37* 57*  CREATININE 3.49* 4.48*  CALCIUM 8.2* 8.5*  AST 16  --   ALT 8*  --   ALKPHOS 169*  --   BILITOT 0.9  --    ------------------------------------------------------------------------------------------------------------------  Cardiac Enzymes  Recent Labs Lab 03/12/16 1137  TROPONINI <0.03   ------------------------------------------------------------------------------------------------------------------  RADIOLOGY:  Dg Chest Port 1 View  03/12/2016  CLINICAL DATA:  Shortness of breath during dialysis. EXAM: PORTABLE CHEST 1 VIEW COMPARISON:  01/24/2016 FINDINGS:  Cardiomediastinal silhouette is enlarged. Mediastinal contours appear intact. There is no evidence of pneumothorax. There is pulmonary vascular congestion. Bilateral pleural effusions are noted, the larger on the left. Left lower lobe airspace consolidation or segmental atelectasis cannot be excluded. Osseous structures are without acute abnormality. Soft tissues are grossly normal. IMPRESSION: Enlarged cardiac silhouette with interstitial pulmonary edema and bilateral pleural effusions. Superimposed left lower lobe airspace consolidation or atelectasis cannot be excluded. Electronically Signed   By: Fidela Salisbury M.D.   On: 03/12/2016 11:39    EKG:   Orders placed or performed in visit on 03/12/16  . EKG 12-Lead    ASSESSMENT AND PLAN:   75 year old male with past medical history of end-stage renal disease on hemodialysis, history of combined systolic diastolic CHF, chronic anemia, hypertension, secondary hyperparathyroidism, dementia, GERD who presents to the hospital due to shortness of breath and noted to be in acute on chronic hypoxic respiratory failure.  1. Acute on chronic respiratory failure with hypoxia-this is secondary to mild pulmonary edemaAnd COPD exacerbation. -Patient is on baseline oxygen about 3-4 L. -Wean O2 as tolerated. - no ABX as no pneumonia seen, steroids added, inh, nebs and dialysis tomorrow  2. End-stage renal disease on hemodialysis-patient gets dialysis on Tuesday and Thursday and Saturday. -Nephrology consulted, dialysis tomorrow per schedule - No fluid overload per them.  3. History of paroxysmal fibrillation-currently rate controlled. Continue amiodarone. -Rate controlled.  4. COPD-continue Dulera, Spiriva.DuoNeb's added. -Due to scattered wheezing, IV steroids added. -Remains on 4 L O2  5. Constipation-several medications added.  6. Secondary hyperparathyroidism-continue Renvela.  7. Essential hypertension-continue Norvasc.  8.  Hyperlipidemia-continue atorvastatin.   Encourage ambulation.   All the records are reviewed and case discussed with Care Management/Social Workerr. Management plans discussed with the patient, family and they are in agreement.  CODE STATUS: Full Code  TOTAL TIME TAKING CARE OF THIS PATIENT: 33 minutes.   POSSIBLE D/C IN 2 DAYS, DEPENDING ON CLINICAL CONDITION.   Gladstone Lighter M.D on 03/13/2016 at 2:25 PM  Between 7am to 6pm - Pager - [phone]  After 6pm go to [email or web address] - password [email or web address] [facility]  Office  [phone]  CC: Primary care physician; Dion Body, MD

## 2016-03-13 NOTE — Telephone Encounter (Signed)
Follow up appointment has been scheduled with the CHF clinic for 03/29/16 at 1:30pm.

## 2016-03-13 NOTE — Care Management (Signed)
Patient present from his dialysis center for shortness of breath.   he completed his dialysis treatment.  Required up to 5 liters of 02 in the ED.  Is resident of [facility]. ESRD with chronic hemodialysis T TH S. Notified dialysis coordinator of admission

## 2016-03-13 NOTE — Progress Notes (Signed)
3 L of oxygen. NSR. Takes meds ok. A & O. Pt requests to have a BM and was given senna, lactulose, ducolax. Will monitor. Pt received SVN for SOB. Pt reports no pain. Pt has no further concerns at this time.

## 2016-03-13 NOTE — Progress Notes (Signed)
CSW spoke to [place]- Development worker, international aid at [facility] to inform him that patient is in [facility] on 2A. Per Marden Noble patient can return to [facility] at discharge. CSW will continue to follow and assist.  Ernest Pine, MSW, [place], [place] Social Worker [phone]

## 2016-03-13 NOTE — NC FL2 (Signed)
[place] LEVEL OF CARE SCREENING TOOL     IDENTIFICATION  Patient Name: [name] Birthdate: Feb 18, 1941 Sex: male Admission Date (Current Location): 03/12/2016  [facility] and [place] Number:  [name]  ([id number] [facility]) Facility and Address:  [facility], [address] [place], [place] [address]      Provider Number: [id number]  Attending Physician Name and Address:  Gladstone Lighter, MD  Relative Name and Phone Number:       Current Level of Care: Hospital Recommended Level of Care: [place] Prior Approval Number:    Date Approved/Denied:   PASRR Number:  ([id number])  Discharge Plan: SNF    Current Diagnoses: Patient Active Problem List   Diagnosis Date Noted  . Acute on chronic respiratory failure with hypoxia ([place]) 03/12/2016  . History of prostate cancer 02/12/2016  . SOB (shortness of breath)   . Acute posthemorrhagic anemia   . Paroxysmal atrial fibrillation (HCC)   . Chronic combined systolic and diastolic congestive heart failure ([place])   . COPD (chronic obstructive pulmonary disease) ([place])   . 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 ESRD on dialysis ([place])   . 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 COPD (chronic obstructive pulmonary disease) with chronic bronchitis ([place]) 11/06/2015  . Chronic diastolic heart failure ([place]) 08/11/2015  . Fluid overload 07/23/2015  . Generalized weakness 05/08/2015  . Other emphysema ([place]) 05/08/2015  . Tobacco use disorder 05/08/2015  . Hypokalemia 05/08/2015  . Pressure ulcer 05/05/2015  . Symptomatic anemia 05/04/2015  . CAFL (chronic airflow limitation) ([place]) 04/10/2015  . Malnutrition of moderate degree ([place]) 03/22/2015  . Pneumonia 03/20/2015  . History of sepsis 02/27/2015  . Incontinence 02/27/2015  . Persistent atrial fibrillation ([place]) 02/27/2015  . Preoperative cardiovascular examination 02/27/2015  . ESRD on hemodialysis ([place])   .  Anemia   . History of small bowel obstruction   . Atrial fibrillation with RVR ([place]) 01/21/2015  . Chronic kidney disease (CKD), stage IV (severe) ([place]) 03/01/2014  . Essential (primary) hypertension 03/01/2014  . 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 Cystitis 11/11/2012  . Hydronephrosis 08/27/2012  . Bladder cancer ([place]) 08/27/2012  . CA of prostate ([place]) 08/27/2012  . Bone metastases ([place]) 08/27/2012    Orientation RESPIRATION BLADDER Height & Weight     Self, Time, Situation, Place  O2 (Nasal Cannula 4 (L/min) ) Continent Weight: 180 lb 9.6 oz (81.92 kg) Height:  6\' 2"  (188 cm)  BEHAVIORAL SYMPTOMS/MOOD NEUROLOGICAL BOWEL NUTRITION STATUS   (None)  (None) Continent Diet (renal with fluid restriction Fluid restriction:: 1200 mL Fluid)  AMBULATORY STATUS COMMUNICATION OF NEEDS Skin   Extensive Assist Verbally Normal                       Personal Care Assistance Level of Assistance  Bathing, Feeding, Dressing Bathing Assistance: Limited assistance Feeding assistance: Independent Dressing Assistance: Limited assistance     Functional Limitations Info  Sight, Hearing, Speech Sight Info: Adequate Hearing Info: Adequate Speech Info: Adequate    SPECIAL CARE FACTORS FREQUENCY                 Contractures      Additional Factors Info  Code Status, Allergies, Isolation Precautions Code Status Info:  (Full Code) Allergies Info:  (Penicillins)     Isolation Precautions Info:  (Infection- MRSA)     Current Medications (03/13/2016):  This is the current hospital active medication list Current Facility-Administered Medications  Medication  Dose Route Frequency Provider Last Rate Last Dose  . acetaminophen (TYLENOL) tablet 650 mg  650 mg Oral Q6H PRN Henreitta Leber, MD       Or  . acetaminophen (TYLENOL) suppository 650 mg  650 mg Rectal Q6H PRN Henreitta Leber, MD      . albuterol (PROVENTIL) (2.5 MG/3ML) 0.083% nebulizer solution 2.5 mg  2.5 mg Nebulization Q4H  PRN Henreitta Leber, MD   2.5 mg at 03/13/16 0025  . amiodarone (PACERONE) tablet 200 mg  200 mg Oral Daily Henreitta Leber, MD   200 mg at 03/13/16 0905  . amLODipine (NORVASC) tablet 2.5 mg  2.5 mg Oral Daily Henreitta Leber, MD   2.5 mg at 03/13/16 0906  . atorvastatin (LIPITOR) tablet 40 mg  40 mg Oral QHS Henreitta Leber, MD   40 mg at 03/12/16 2306  . ferrous sulfate tablet 325 mg  325 mg Oral Daily Henreitta Leber, MD   325 mg at 03/13/16 0905  . guaiFENesin (MUCINEX) 12 hr tablet 600 mg  600 mg Oral BID Henreitta Leber, MD   600 mg at 03/13/16 0905  . guaiFENesin (ROBITUSSIN) 100 MG/5ML solution 200 mg  200 mg Oral Q4H PRN Henreitta Leber, MD      . heparin injection 5,000 Units  5,000 Units Subcutaneous Q8H Henreitta Leber, MD   5,000 Units at 03/12/16 2307  . ipratropium-albuterol (DUONEB) 0.5-2.5 (3) MG/3ML nebulizer solution 3 mL  3 mL Nebulization TID Henreitta Leber, MD   3 mL at 03/13/16 0751  . LORazepam (ATIVAN) tablet 0.5 mg  0.5 mg Oral Q4H PRN Henreitta Leber, MD   0.5 mg at 03/13/16 0023  . magnesium oxide (MAG-OX) tablet 400 mg  400 mg Oral QPM Henreitta Leber, MD   400 mg at 03/12/16 1706  . methylPREDNISolone sodium succinate (SOLU-MEDROL) 125 mg/2 mL injection 60 mg  60 mg Intravenous Q12H Lavonia Dana, MD   60 mg at 03/13/16 0345  . mometasone-formoterol (DULERA) 200-5 MCG/ACT inhaler 2 puff  2 puff Inhalation BID Henreitta Leber, MD   2 puff at 03/13/16 0844  . ondansetron (ZOFRAN) tablet 4 mg  4 mg Oral Q6H PRN Henreitta Leber, MD       Or  . ondansetron (ZOFRAN) injection 4 mg  4 mg Intravenous Q6H PRN Henreitta Leber, MD      . oxyCODONE (Oxy IR/ROXICODONE) immediate release tablet 5 mg  5 mg Oral Q12H PRN Henreitta Leber, MD      . polyethylene glycol (MIRALAX / GLYCOLAX) packet 17 g  17 g Oral Daily PRN Henreitta Leber, MD      . senna-docusate (Senokot-S) tablet 2 tablet  2 tablet Oral BID Gladstone Lighter, MD      . sevelamer carbonate (RENVELA) tablet 800 mg   800 mg Oral TID AC Henreitta Leber, MD   800 mg at 03/13/16 0912  . sodium chloride flush (NS) 0.9 % injection 3 mL  3 mL Intravenous Q12H Henreitta Leber, MD   3 mL at 03/13/16 0913  . tiotropium (SPIRIVA) inhalation capsule 18 mcg  18 mcg Inhalation Daily Henreitta Leber, MD   18 mcg at 03/13/16 0907     Discharge Medications: Please see discharge summary for a list of discharge medications.  Relevant Imaging Results:  Relevant Lab Results:   Additional Information  (SSN [id number])  Lorenso Quarry Bonni Neuser, LCSW

## 2016-03-13 NOTE — Clinical Social Work Note (Signed)
Clinical Social Work Assessment  Patient Details  Name: [name] MRN: [id number] Date of Birth: May 19, 1941  Date of referral:  03/13/16               Reason for consult:  Discharge Planning                Permission sought to share information with:  Family Supports Permission granted to share information::  Yes, Verbal Permission Granted  Name::        Agency::     Relationship::   [name] [phone])  Contact Information:     Housing/Transportation Living arrangements for the past 2 months:  [place] of Information:  Patient Patient Interpreter Needed:  None Criminal Activity/Legal Involvement Pertinent to Current Situation/Hospitalization:  No - Comment as needed Significant Relationships:  Other Family Members Lives with:  Facility Resident Do you feel safe going back to the place where you live?  Yes Need for family participation in patient care:  No (Coment)  Care giving concerns:  Patient is a resident at [facility] and would like to return at discharge.    Social Worker assessment / plan:  CSW met with patient at bedside. Introduced herself and her role. Per patient he's  Resident at [facility]. Reported he's been there for 2 months. Stated he was at [facility] but they informed him that he owed $4,000 and he didn't understand how. Reported that he would like to return at discharge. Stated that he likes it at [facility]. Granted CSW verbal permission to coordinate discharge with [facility]. Informed CSW that she can contact [name] if she has any additional questions or needed to get in contact with a family member. FL2/ PASRR completed and faxed to [facility]. FL2 placed in MD's basket for co-sign. CSW will continue to follow and assist.   Employment status:  Retired Nurse, adult PT Recommendations:  Not assessed at this time Information / Referral to community resources:      Patient/Family's Response to care:  Patient is in agreement to return to [facility] at discharge.   Patient/Family's Understanding of and Emotional Response to Diagnosis, Current Treatment, and Prognosis:  Patient reports that he understands why he is at [facility]. Reported he appreciates CSW's assistance.   Emotional Assessment Appearance:  Appears stated age Attitude/Demeanor/Rapport:   (None) Affect (typically observed):  Calm, Pleasant, Accepting Orientation:  Oriented to Self, Oriented to Place, Oriented to  Time, Oriented to Situation Alcohol / Substance use:  Not Applicable Psych involvement (Current and /or in the community):  No (Comment)  Discharge Needs  Concerns to be addressed:  Discharge Planning Concerns Readmission within the last 30 days:  No Current discharge risk:  Chronically ill Barriers to Discharge:  Continued Medical Work up   [employer], LCSW 03/13/2016, 3:21 PM

## 2016-03-13 NOTE — Progress Notes (Signed)
[facility]  ROUNDING NOTE   Subjective:   Hemodialysis treatment yesterday.  Complains of shortness of breath and constipation.   Objective:  Vital signs in last 24 hours:  Temp:  [97.8 F (36.6 C)-98.2 F (36.8 C)] 98.2 F (36.8 C) (06/21 0436) Pulse Rate:  [74-126] 81 (06/21 0900) Resp:  [16-39] 18 (06/21 0436) BP: (120-143)/(68-85) 133/81 mmHg (06/21 0900) SpO2:  [94 %-100 %] 99 % (06/21 0753) FiO2 (%):  [36 %] 36 % (06/21 0753) Weight:  [81.92 kg (180 lb 9.6 oz)-99.791 kg (220 lb)] 81.92 kg (180 lb 9.6 oz) (06/20 1541)  Weight change:  Filed Weights   03/12/16 1110 03/12/16 1541  Weight: 99.791 kg (220 lb) 81.92 kg (180 lb 9.6 oz)    Intake/Output: I/O last 3 completed shifts: In: 3 [I.V.:3] Out: 0    Intake/Output this shift:  Total I/O In: 243 [P.O.:240; I.V.:3] Out: -   Physical Exam: General: NAD, laying in bed  Head: Normocephalic, atraumatic. Moist oral mucosal membranes  Eyes: Anicteric, PERRL  Neck: Supple, trachea midline  Lungs:  +bilateral wheezing, 4 Litres [place]  Heart: Regular rate and rhythm  Abdomen:  Soft, nontender  Extremities:  no peripheral edema.  Neurologic: Nonfocal, moving all four extremities  Skin: No lesions  Access: Left arm AVF    Basic Metabolic Panel:  Recent Labs Lab 03/12/16 1137 03/13/16 0348  NA 138 138  K 3.3* 4.2  CL 97* 97*  CO2 32 28  GLUCOSE 128* 158*  BUN 37* 57*  CREATININE 3.49* 4.48*  CALCIUM 8.2* 8.5*    Liver Function Tests:  Recent Labs Lab 03/12/16 1137  AST 16  ALT 8*  ALKPHOS 169*  BILITOT 0.9  PROT 6.6  ALBUMIN 2.8*   No results for input(s): LIPASE, AMYLASE in the last 168 hours. No results for input(s): AMMONIA in the last 168 hours.  CBC:  Recent Labs Lab 03/12/16 1137 03/13/16 0348  WBC 8.6 4.6  NEUTROABS 6.8*  --   HGB 8.3* 8.3*  HCT 26.1* 25.8*  MCV 87.5 85.6  PLT 224 209    Cardiac Enzymes:  Recent Labs Lab 03/12/16 1137  TROPONINI <0.03     BNP: Invalid input(s): POCBNP  CBG: No results for input(s): GLUCAP in the last 168 hours.  Microbiology: Results for orders placed or performed during the hospital encounter of 03/12/16  MRSA PCR Screening     Status: None   Collection Time: 03/12/16  3:30 PM  Result Value Ref Range Status   MRSA by PCR NEGATIVE NEGATIVE Final    Comment:        The GeneXpert MRSA Assay (FDA approved for NASAL specimens only), is one component of a comprehensive MRSA colonization surveillance program. It is not intended to diagnose MRSA infection nor to guide or monitor treatment for MRSA infections.     Coagulation Studies: No results for input(s): LABPROT, INR in the last 72 hours.  Urinalysis: No results for input(s): COLORURINE, LABSPEC, PHURINE, GLUCOSEU, HGBUR, BILIRUBINUR, KETONESUR, PROTEINUR, UROBILINOGEN, NITRITE, LEUKOCYTESUR in the last 72 hours.  Invalid input(s): APPERANCEUR    Imaging: Dg Chest Port 1 View  03/12/2016  CLINICAL DATA:  Shortness of breath during dialysis. EXAM: PORTABLE CHEST 1 VIEW COMPARISON:  01/24/2016 FINDINGS: Cardiomediastinal silhouette is enlarged. Mediastinal contours appear intact. There is no evidence of pneumothorax. There is pulmonary vascular congestion. Bilateral pleural effusions are noted, the larger on the left. Left lower lobe airspace consolidation or segmental atelectasis cannot be excluded. Osseous  structures are without acute abnormality. Soft tissues are grossly normal. IMPRESSION: Enlarged cardiac silhouette with interstitial pulmonary edema and bilateral pleural effusions. Superimposed left lower lobe airspace consolidation or atelectasis cannot be excluded. Electronically Signed   By: Fidela Salisbury M.D.   On: 03/12/2016 11:39     Medications:     . amiodarone  200 mg Oral Daily  . amLODipine  2.5 mg Oral Daily  . atorvastatin  40 mg Oral QHS  . ferrous sulfate  325 mg Oral Daily  . guaiFENesin  600 mg Oral BID  .  heparin  5,000 Units Subcutaneous Q8H  . ipratropium-albuterol  3 mL Nebulization TID  . magnesium oxide  400 mg Oral QPM  . methylPREDNISolone (SOLU-MEDROL) injection  60 mg Intravenous Q12H  . mometasone-formoterol  2 puff Inhalation BID  . senna-docusate  2 tablet Oral BID  . sevelamer carbonate  800 mg Oral TID AC  . sodium chloride flush  3 mL Intravenous Q12H  . tiotropium  18 mcg Inhalation Daily   acetaminophen **OR** acetaminophen, albuterol, guaiFENesin, LORazepam, ondansetron **OR** ondansetron (ZOFRAN) IV, oxyCODONE, polyethylene glycol  Assessment/ Plan:  [name] is a 75 y.o. 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   1. ESRD on HD TTHS: No acute indication for dialysis.   - Continue TTS schedule.    2. Anemia of CKD:  - epogen with HD treatment.  - Fe supplements.   3. SHPTH: PTH at goal as outpatient.  - Continue Renvela for phos binding  4. Hypertension at goal.  - Continue amlodipine.  5. Acute Exacerbation of COPD: with hypoxia.  - systemic steroids - Continue Oxygen and nebs.    LOS: 1 [name] 6/21/201710:38 AM

## 2016-03-13 NOTE — Progress Notes (Signed)
A&O. Very dyspneic with minimal exertion. On 4L O2. IV steroids given. Nebs given. Miralax given per patient request.

## 2016-03-14 LAB — BASIC METABOLIC PANEL
Anion gap: 18 — ABNORMAL HIGH (ref 5–15)
BUN: 75 mg/dL — AB (ref 6–20)
CALCIUM: 8.8 mg/dL — AB (ref 8.9–10.3)
CO2: 25 mmol/L (ref 22–32)
CREATININE: 5.58 mg/dL — AB (ref 0.61–1.24)
Chloride: 94 mmol/L — ABNORMAL LOW (ref 101–111)
GFR calc non Af Amer: 9 mL/min — ABNORMAL LOW (ref >60)
GFR, EST AFRICAN AMERICAN: 10 mL/min — AB (ref >60)
Glucose, Bld: 148 mg/dL — ABNORMAL HIGH (ref 65–99)
Potassium: 5.6 mmol/L — ABNORMAL HIGH (ref 3.5–5.1)
SODIUM: 137 mmol/L (ref 135–145)

## 2016-03-14 MED ORDER — PREDNISONE 10 MG (21) PO TBPK: 10.0000 mg | ORAL_TABLET | Freq: Every day | ORAL | Status: DC

## 2016-03-14 MED ORDER — CARVEDILOL 3.125 MG PO TABS: 3.1250 mg | ORAL_TABLET | Freq: Two times a day (BID) | ORAL | Status: AC

## 2016-03-14 MED ORDER — LORAZEPAM 0.5 MG PO TABS: 0.5000 mg | ORAL_TABLET | ORAL | Status: DC | PRN

## 2016-03-14 MED ORDER — OXYCODONE HCL 5 MG PO TABS: 5.0000 mg | ORAL_TABLET | Freq: Four times a day (QID) | ORAL | Status: DC | PRN

## 2016-03-14 NOTE — Progress Notes (Signed)
Patient back from dialysis. Resting quietly, no complaints. Packet prepared by CSW, [employer]. Report called to [place] at [facility]. EMS called for transport. IV and tele to be removed when transportation arrives.

## 2016-03-14 NOTE — Progress Notes (Deleted)
Patient is awake and alert.  Is aware of his current condition and BP readings.  States his hypertension meds were DCd by his physician after he lost 40 pounds.  BP has come down to SBP 1602 and DBP 90s.

## 2016-03-14 NOTE — Progress Notes (Signed)
Dr. Tressia Miners rounding now, aware of high potassium. Pt to have dialysis prior to being discharged back to facility.

## 2016-03-14 NOTE — Care Management Important Message (Signed)
Important Message  Patient Details  Name: [name] MRN: [id number] Date of Birth: Jul 30, 1941   Medicare Important Message Given:  Yes    Juliann Pulse A Hasina Kreager 03/14/2016, 10:04 AM

## 2016-03-14 NOTE — Progress Notes (Signed)
Per Dr. Juleen China, patient not going for dialysis until later today - ok to give all morning medicines.

## 2016-03-14 NOTE — Progress Notes (Signed)
PRE HD   

## 2016-03-14 NOTE — Progress Notes (Signed)
HD TX ended  

## 2016-03-14 NOTE — Progress Notes (Signed)
Clinical Social Worker was informed that patient will be medically ready to discharge to [facility]. Patient in a agreement with plan. CSW called Doug- Admissions Coordinator at [facility] to confirm that patient's bed is ready. Provided patient's room number 202 and number to call for report [phone] . All discharge information faxed to [facility] via [employer]. Call to [name], to inform her patient would discharge to [facility]. RN will call report and patient will discharge to [facility] via EMS.   Ernest Pine, MSW, [place], [place] Clinical Social Worker [phone]

## 2016-03-14 NOTE — Progress Notes (Signed)
Patient is awake and alert.  Denies pain. Concerned over whether he left is wheelchair at dialysis on Monday.  MD informs me it has been taken to [facility].  Patient informed.  Potassium 5.6. Scheduled for dialysis today.

## 2016-03-14 NOTE — Progress Notes (Signed)
TX started 

## 2016-03-14 NOTE — Progress Notes (Signed)
[facility]  ROUNDING NOTE   Subjective:   Breathing nonlabored.  Had bowel movement yesterday. Worried about location of his wheelchair  Objective:  Vital signs in last 24 hours:  Temp:  [97.5 F (36.4 C)-97.8 F (36.6 C)] 97.7 F (36.5 C) (06/22 0826) Pulse Rate:  [75-84] 75 (06/22 0826) Resp:  [15-24] 24 (06/22 0826) BP: (123-137)/(76-88) 123/76 mmHg (06/22 0826) SpO2:  [98 %-100 %] 100 % (06/22 0826)  Weight change:  Filed Weights   03/12/16 1110 03/12/16 1541  Weight: 99.791 kg (220 lb) 81.92 kg (180 lb 9.6 oz)    Intake/Output: I/O last 3 completed shifts: In: 363 [P.O.:360; I.V.:3] Out: 0    Intake/Output this shift:     Physical Exam: General: NAD, laying in bed  Head: Normocephalic, atraumatic. Moist oral mucosal membranes  Eyes: Anicteric, PERRL  Neck: Supple, trachea midline  Lungs:  clear, 3 Litres [place]  Heart: Regular rate and rhythm  Abdomen:  Soft, nontender  Extremities:  no peripheral edema.  Neurologic: Nonfocal, moving all four extremities  Skin: No lesions  Access: Left arm AVF    Basic Metabolic Panel:  Recent Labs Lab 03/12/16 1137 03/13/16 0348 03/14/16 0522  NA 138 138 137  K 3.3* 4.2 5.6*  CL 97* 97* 94*  CO2 32 28 25  GLUCOSE 128* 158* 148*  BUN 37* 57* 75*  CREATININE 3.49* 4.48* 5.58*  CALCIUM 8.2* 8.5* 8.8*    Liver Function Tests:  Recent Labs Lab 03/12/16 1137  AST 16  ALT 8*  ALKPHOS 169*  BILITOT 0.9  PROT 6.6  ALBUMIN 2.8*   No results for input(s): LIPASE, AMYLASE in the last 168 hours. No results for input(s): AMMONIA in the last 168 hours.  CBC:  Recent Labs Lab 03/12/16 1137 03/13/16 0348  WBC 8.6 4.6  NEUTROABS 6.8*  --   HGB 8.3* 8.3*  HCT 26.1* 25.8*  MCV 87.5 85.6  PLT 224 209    Cardiac Enzymes:  Recent Labs Lab 03/12/16 1137  TROPONINI <0.03    BNP: Invalid input(s): POCBNP  CBG: No results for input(s): GLUCAP in the last 168  hours.  Microbiology: Results for orders placed or performed during the hospital encounter of 03/12/16  MRSA PCR Screening     Status: None   Collection Time: 03/12/16  3:30 PM  Result Value Ref Range Status   MRSA by PCR NEGATIVE NEGATIVE Final    Comment:        The GeneXpert MRSA Assay (FDA approved for NASAL specimens only), is one component of a comprehensive MRSA colonization surveillance program. It is not intended to diagnose MRSA infection nor to guide or monitor treatment for MRSA infections.     Coagulation Studies: No results for input(s): LABPROT, INR in the last 72 hours.  Urinalysis: No results for input(s): COLORURINE, LABSPEC, PHURINE, GLUCOSEU, HGBUR, BILIRUBINUR, KETONESUR, PROTEINUR, UROBILINOGEN, NITRITE, LEUKOCYTESUR in the last 72 hours.  Invalid input(s): APPERANCEUR    Imaging: Dg Chest Port 1 View  03/12/2016  CLINICAL DATA:  Shortness of breath during dialysis. EXAM: PORTABLE CHEST 1 VIEW COMPARISON:  01/24/2016 FINDINGS: Cardiomediastinal silhouette is enlarged. Mediastinal contours appear intact. There is no evidence of pneumothorax. There is pulmonary vascular congestion. Bilateral pleural effusions are noted, the larger on the left. Left lower lobe airspace consolidation or segmental atelectasis cannot be excluded. Osseous structures are without acute abnormality. Soft tissues are grossly normal. IMPRESSION: Enlarged cardiac silhouette with interstitial pulmonary edema and bilateral pleural effusions. Superimposed left  lower lobe airspace consolidation or atelectasis cannot be excluded. Electronically Signed   By: Fidela Salisbury M.D.   On: 03/12/2016 11:39     Medications:     . amiodarone  200 mg Oral Daily  . amLODipine  2.5 mg Oral Daily  . atorvastatin  40 mg Oral QHS  . ferrous sulfate  325 mg Oral Daily  . guaiFENesin  600 mg Oral BID  . heparin  5,000 Units Subcutaneous Q8H  . ipratropium-albuterol  3 mL Nebulization TID  .  magnesium oxide  400 mg Oral QPM  . methylPREDNISolone (SOLU-MEDROL) injection  60 mg Intravenous Q12H  . mometasone-formoterol  2 puff Inhalation BID  . senna-docusate  2 tablet Oral BID  . sevelamer carbonate  800 mg Oral TID AC  . sodium chloride flush  3 mL Intravenous Q12H  . tiotropium  18 mcg Inhalation Daily   acetaminophen **OR** acetaminophen, albuterol, bisacodyl, guaiFENesin, lactulose, LORazepam, ondansetron **OR** ondansetron (ZOFRAN) IV, oxyCODONE, polyethylene glycol  Assessment/ Plan:  [name] is a 75 y.o. 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   1. ESRD on HD TTHS: Dialysis for later today. Orders prepared.  - Continue TTS schedule.    2. Anemia of CKD:  - epogen with HD treatment.  - Fe supplements.   3. SHPTH: PTH at goal as outpatient.  - Continue Renvela for phos binding  4. Hypertension at goal.  - Continue amlodipine.  5. Acute Exacerbation of COPD: with hypoxia.  - systemic steroids - Continue Oxygen and nebs.    LOS: 2 [name] 6/22/20179:37 AM

## 2016-03-14 NOTE — Progress Notes (Signed)
Post HD  

## 2016-03-14 NOTE — Discharge Summary (Addendum)
[facility] NAME: [name]    MR#:  [id number]  DATE OF BIRTH:  Jan 27, 1941  DATE OF ADMISSION:  03/12/2016 ADMITTING PHYSICIAN: Henreitta Leber, MD  DATE OF DISCHARGE: 03/14/16  PRIMARY CARE PHYSICIAN: Dion Body, MD    ADMISSION DIAGNOSIS:  Acute on chronic systolic congestive heart failure ([place]) [I50.23]  DISCHARGE DIAGNOSIS:  Active Problems:   Acute on chronic respiratory failure with hypoxia ([place])   SECONDARY DIAGNOSIS:   Past Medical History  Diagnosis Date  . PAF (paroxysmal atrial fibrillation) ([place])     a. new onset 12/2014 in the setting of UTI, sepsis, hypotension, and anemia; b. not on long term anticoagulation given anemia; c. family aware of stroke risk, they are ok with this; d. on amiodarone   . Chronic combined systolic and diastolic CHF (congestive heart failure) ([place])     a. echo 12/2014: EF 25-30%, anterior wall wall motion abnormalities; b. planned ischemic evaluation once patient is stable medically; c. echo 01/2015: EF 45-50%, no RWMA, mild AT, LA mildly dilated, mod pericardial effusion along LV free wall, no evidence of hemodynamic compromise  . ESRD on hemodialysis ([place])     a. Tuesday, Thursday, and Saturdays  . Anemia     a. baseline hgb ~ 8  . History of small bowel obstruction     a. 01/2015  . History of septic shock     a. 01/2015  . GERD (gastroesophageal reflux disease)   . Allergic rhinitis   . COPD (chronic obstructive pulmonary disease) ([place])   . Hypercholesteremia   . Cognitive communication deficit   . Magnesium deficiency   . Dementia   . 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 Incontinence   . Hydronephrosis   . Overactive bladder   . Detrusor sphincter dyssynergia   . Mini stroke ([place])   . Bladder cancer ([place])   . Prostate cancer ([place])   . Hypertension     HOSPITAL COURSE:   75 year old male with past medical history of end-stage renal disease on hemodialysis,  history of combined systolic diastolic CHF, chronic anemia, hypertension, secondary hyperparathyroidism, dementia, GERD who presents to the hospital due to shortness of breath and noted to be in acute on chronic hypoxic respiratory failure.  1. Acute on chronic respiratory failure with hypoxia-this is secondary to mild pulmonary edema And COPD exacerbation. -Patient is on baseline oxygen about 3-4 L. Currently down to 3L o2. - no ABX as no pneumonia seen, steroids added, inh, nebs and dialysis today prior to discharge  2. End-stage renal disease on hemodialysis-patient gets dialysis on Tuesday and Thursday and Saturday. -Nephrology consulted, dialysis today per schedule  3. History of paroxysmal fibrillation-currently rate controlled. Continue amiodarone. -Rate controlled.  4. COPD-continue Dulera, Spiriva.DuoNeb's added. -change solumedrol to oral prednisone taper Part of dyspnea is also from anxiety -Remains on 3L O2- chronic  5. Combined CHF- chronic, last EF 35% Start low dose coreg at discharge, If BP remains stable- can also add ACEI or ARB as outpatient  6. Hyperlipidemia-continue atorvastatin.  Will benefit from physical Therapy at the rehab.   DISCHARGE CONDITIONS:   Guarded  CONSULTS OBTAINED:  Treatment Team:  Lavonia Dana, MD  DRUG ALLERGIES:   Allergies  Allergen Reactions  . Penicillins Hives, Itching and Swelling    Has patient had a PCN reaction causing immediate rash, facial/tongue/throat swelling, SOB or lightheadedness with hypotension: Yes Has patient had a PCN reaction causing severe rash involving  mucus membranes or skin necrosis: No Has patient had a PCN reaction that required hospitalization No Has patient had a PCN reaction occurring within the last 10 years: No If all of the above answers are "NO", then may proceed with Cephalosporin use.    DISCHARGE MEDICATIONS:   Current Discharge Medication List    START taking these medications    Details  carvedilol (COREG) 3.125 MG tablet Take 1 tablet (3.125 mg total) by mouth 2 (two) times daily with a meal. Qty: 60 tablet, Refills: 2    predniSONE (STERAPRED UNI-PAK 21 TAB) 10 MG (21) TBPK tablet Take 1 tablet (10 mg total) by mouth daily. 6 tabs PO x 1 day 5 tabs PO x 1 day 4 tabs PO x 1 day 3 tabs PO x 1 day 2 tabs PO x 1 day 1 tab PO x 1 day and stop Qty: 21 tablet, Refills: 0      CONTINUE these medications which have CHANGED   Details  LORazepam (ATIVAN) 0.5 MG tablet Take 1 tablet (0.5 mg total) by mouth every 4 (four) hours as needed for anxiety. Qty: 30 tablet, Refills: 0    oxyCODONE (OXY IR/ROXICODONE) 5 MG immediate release tablet Take 1 tablet (5 mg total) by mouth every 6 (six) hours as needed for moderate pain or severe pain. Qty: 20 tablet, Refills: 0      CONTINUE these medications which have NOT CHANGED   Details  acetaminophen (TYLENOL) 325 MG tablet Take 650 mg by mouth every 8 (eight) hours as needed. For general discomfort    albuterol (PROVENTIL) (2.5 MG/3ML) 0.083% nebulizer solution Take 3 mLs (2.5 mg total) by nebulization every 4 (four) hours as needed for wheezing or shortness of breath. Qty: 75 mL, Refills: 12    amiodarone (PACERONE) 200 MG tablet Take 1 tablet (200 mg total) by mouth daily. Qty: 30 tablet, Refills: 5    atorvastatin (LIPITOR) 40 MG tablet Take 40 mg by mouth at bedtime.    budesonide-formoterol (SYMBICORT) 160-4.5 MCG/ACT inhaler Inhale 2 puffs into the lungs 2 (two) times daily.    ferrous sulfate 325 (65 FE) MG tablet Take 325 mg by mouth daily.    guaiFENesin (MUCINEX) 600 MG 12 hr tablet Take 600 mg by mouth 2 (two) times daily.    guaifenesin (ROBITUSSIN) 100 MG/5ML syrup Take 200 mg by mouth every 4 (four) hours as needed for cough.    ipratropium-albuterol (DUONEB) 0.5-2.5 (3) MG/3ML SOLN Take 3 mLs by nebulization 3 (three) times daily.    magnesium oxide (MAG-OX) 400 MG tablet Take 1 tablet (400 mg total)  by mouth every evening. Qty: 30 tablet, Refills: 2    polyethylene glycol (MIRALAX / GLYCOLAX) packet Take 17 g by mouth daily as needed for mild constipation, moderate constipation or severe constipation.    sevelamer carbonate (RENVELA) 800 MG tablet Take 800 mg by mouth 3 (three) times daily before meals.     tiotropium (SPIRIVA) 18 MCG inhalation capsule Place 1 capsule (18 mcg total) into inhaler and inhale daily. Qty: 30 capsule, Refills: 12    ondansetron (ZOFRAN ODT) 8 MG disintegrating tablet Take 1 tablet (8 mg total) by mouth every 8 (eight) hours as needed for nausea or vomiting. Qty: 20 tablet, Refills: 0      STOP taking these medications     amLODipine (NORVASC) 2.5 MG tablet      loperamide (IMODIUM A-D) 2 MG tablet          DISCHARGE  INSTRUCTIONS:   1. PCP f/u in 1 week 2. Dialysis f/u in 2 days as prior scheduled for this Saturday 3. Physical Therapy   If you experience worsening of your admission symptoms, develop shortness of breath, life threatening emergency, suicidal or homicidal thoughts you must seek medical attention immediately by calling 911 or calling your MD immediately  if symptoms less severe.  You Must read complete instructions/literature along with all the possible adverse reactions/side effects for all the Medicines you take and that have been prescribed to you. Take any new Medicines after you have completely understood and accept all the possible adverse reactions/side effects.   Please note  You were cared for by a hospitalist during your hospital stay. If you have any questions about your discharge medications or the care you received while you were in the hospital after you are discharged, you can call the unit and asked to speak with the hospitalist on call if the hospitalist that took care of you is not available. Once you are discharged, your primary care physician will handle any further medical issues. Please note that NO REFILLS for  any discharge medications will be authorized once you are discharged, as it is imperative that you return to your primary care physician (or establish a relationship with a primary care physician if you do not have one) for your aftercare needs so that they can reassess your need for medications and monitor your lab values.    Today   CHIEF COMPLAINT:   Chief Complaint  Patient presents with  . Shortness of Breath    VITAL SIGNS:  Blood pressure 123/76, pulse 75, temperature 97.7 F (36.5 C), temperature source Oral, resp. rate 24, height 6\' 2"  (1.88 m), weight 81.92 kg (180 lb 9.6 oz), SpO2 100 %.  I/O:   Intake/Output Summary (Last 24 hours) at 03/14/16 0920 Last data filed at 03/13/16 1900  Gross per 24 hour  Intake    360 ml  Output      0 ml  Net    360 ml    PHYSICAL EXAMINATION:   Physical Exam  GENERAL: 75 y.o.-year-old patient lying in the bed with no acute distress.  EYES: Pupils equal, round, reactive to light and accommodation. No scleral icterus. Extraocular muscles intact.  HEENT: Head atraumatic, normocephalic. Oropharynx and nasopharynx clear.  NECK: Supple, no jugular venous distention. No thyroid enlargement, no tenderness.  LUNGS: Normal breath sounds bilaterally, no rhonchi or crepitation. No use of accessory muscles of respiration. No wheezing, fine bibasilar crackles noted. CARDIOVASCULAR: S1, S2 normal. No murmurs, rubs, or gallops.  ABDOMEN: Soft, nontender, nondistended. Bowel sounds present. No organomegaly or mass.  EXTREMITIES: No pedal edema, cyanosis, or clubbing.  NEUROLOGIC: Cranial nerves II through XII are intact. Muscle strength 5/5 in all extremities. Sensation intact. Gait not checked.  PSYCHIATRIC: The patient is alert and oriented x 3.  SKIN: No obvious rash, lesion, or ulcer. Marland Kitchen   DATA REVIEW:   CBC  Recent Labs Lab 03/13/16 0348  WBC 4.6  HGB 8.3*  HCT 25.8*  PLT 209    Chemistries   Recent Labs Lab  03/12/16 1137  03/14/16 0522  NA 138  < > 137  K 3.3*  < > 5.6*  CL 97*  < > 94*  CO2 32  < > 25  GLUCOSE 128*  < > 148*  BUN 37*  < > 75*  CREATININE 3.49*  < > 5.58*  CALCIUM 8.2*  < > 8.8*  AST 16  --   --   ALT 8*  --   --   ALKPHOS 169*  --   --   BILITOT 0.9  --   --   < > = values in this interval not displayed.  Cardiac Enzymes  Recent Labs Lab 03/12/16 1137  TROPONINI <0.03    Microbiology Results  Results for orders placed or performed during the hospital encounter of 03/12/16  MRSA PCR Screening     Status: None   Collection Time: 03/12/16  3:30 PM  Result Value Ref Range Status   MRSA by PCR NEGATIVE NEGATIVE Final    Comment:        The GeneXpert MRSA Assay (FDA approved for NASAL specimens only), is one component of a comprehensive MRSA colonization surveillance program. It is not intended to diagnose MRSA infection nor to guide or monitor treatment for MRSA infections.     RADIOLOGY:  Dg Chest Port 1 View  03/12/2016  CLINICAL DATA:  Shortness of breath during dialysis. EXAM: PORTABLE CHEST 1 VIEW COMPARISON:  01/24/2016 FINDINGS: Cardiomediastinal silhouette is enlarged. Mediastinal contours appear intact. There is no evidence of pneumothorax. There is pulmonary vascular congestion. Bilateral pleural effusions are noted, the larger on the left. Left lower lobe airspace consolidation or segmental atelectasis cannot be excluded. Osseous structures are without acute abnormality. Soft tissues are grossly normal. IMPRESSION: Enlarged cardiac silhouette with interstitial pulmonary edema and bilateral pleural effusions. Superimposed left lower lobe airspace consolidation or atelectasis cannot be excluded. Electronically Signed   By: Fidela Salisbury M.D.   On: 03/12/2016 11:39    EKG:   Orders placed or performed in visit on 03/12/16  . EKG 12-Lead  . EKG 12-Lead      Management plans discussed with the patient, family and they are in  agreement.  CODE STATUS:     Code Status Orders        Start     Ordered   03/12/16 1529  Full code   Continuous     03/12/16 1528    Code Status History    Date Active Date Inactive Code Status Order ID Comments User Context   10/19/2015  9:09 AM 10/21/2015  2:40 AM Full Code [id number]  Harrie Foreman, MD Inpatient   09/11/2015  9:55 AM 09/13/2015  6:51 PM Full Code [id number]  Gladstone Lighter, MD Inpatient   07/23/2015  3:03 PM 07/24/2015  9:39 PM Full Code [id number]  Vaughan Basta, MD Inpatient   06/06/2015  1:40 PM 06/10/2015  8:35 PM Full Code [id number]  Bettey Costa, MD ED   05/04/2015  6:59 PM 05/08/2015  6:09 PM Full Code [id number]  Loletha Grayer, MD ED   03/20/2015  6:35 PM 03/27/2015  8:00 PM Full Code [id number]  Loletha Grayer, MD ED   02/13/2015 10:04 PM 02/17/2015  8:15 PM Full Code [id number]  Hillary Bow, MD Inpatient   01/21/2015 11:45 PM 02/04/2015  9:31 PM Full Code [id number]  Dia Sitter, RN Inpatient      TOTAL TIME TAKING CARE OF THIS PATIENT: 37 minutes.    Gladstone Lighter M.D on 03/14/2016 at 9:20 AM  Between 7am to 6pm - Pager - [phone]  After 6pm go to [email or web address] - password [email or web address] [facility]  Office  [phone]  CC: Primary care physician; Dion Body, MD

## 2016-03-29 ENCOUNTER — Ambulatory Visit: Payer: Medicare Other | Admitting: Family

## 2016-03-29 ENCOUNTER — Encounter: Payer: Self-pay | Admitting: Family

## 2016-03-29 VITALS — BP 116/61 | HR 66 | Resp 18 | Ht 74.0 in

## 2016-03-29 DIAGNOSIS — E7800 Pure hypercholesterolemia, unspecified: Secondary | ICD-10-CM | POA: Insufficient documentation

## 2016-03-29 DIAGNOSIS — Z87891 Personal history of nicotine dependence: Secondary | ICD-10-CM | POA: Insufficient documentation

## 2016-03-29 DIAGNOSIS — I480 Paroxysmal atrial fibrillation: Secondary | ICD-10-CM

## 2016-03-29 DIAGNOSIS — F0390 Unspecified dementia without behavioral disturbance: Secondary | ICD-10-CM | POA: Diagnosis present

## 2016-03-29 DIAGNOSIS — Z823 Family history of stroke: Secondary | ICD-10-CM

## 2016-03-29 DIAGNOSIS — Z880 Allergy status to penicillin: Secondary | ICD-10-CM | POA: Insufficient documentation

## 2016-03-29 DIAGNOSIS — Z8551 Personal history of malignant neoplasm of bladder: Secondary | ICD-10-CM

## 2016-03-29 DIAGNOSIS — Z8673 Personal history of transient ischemic attack (TIA), and cerebral infarction without residual deficits: Secondary | ICD-10-CM | POA: Insufficient documentation

## 2016-03-29 DIAGNOSIS — R05 Cough: Secondary | ICD-10-CM | POA: Insufficient documentation

## 2016-03-29 DIAGNOSIS — I5043 Acute on chronic combined systolic (congestive) and diastolic (congestive) heart failure: Secondary | ICD-10-CM | POA: Diagnosis present

## 2016-03-29 DIAGNOSIS — N3281 Overactive bladder: Secondary | ICD-10-CM

## 2016-03-29 DIAGNOSIS — K219 Gastro-esophageal reflux disease without esophagitis: Secondary | ICD-10-CM

## 2016-03-29 DIAGNOSIS — Z79891 Long term (current) use of opiate analgesic: Secondary | ICD-10-CM

## 2016-03-29 DIAGNOSIS — N393 Stress incontinence (female) (male): Secondary | ICD-10-CM

## 2016-03-29 DIAGNOSIS — J90 Pleural effusion, not elsewhere classified: Secondary | ICD-10-CM | POA: Diagnosis present

## 2016-03-29 DIAGNOSIS — J449 Chronic obstructive pulmonary disease, unspecified: Secondary | ICD-10-CM | POA: Insufficient documentation

## 2016-03-29 DIAGNOSIS — Z8546 Personal history of malignant neoplasm of prostate: Secondary | ICD-10-CM

## 2016-03-29 DIAGNOSIS — I5042 Chronic combined systolic (congestive) and diastolic (congestive) heart failure: Secondary | ICD-10-CM | POA: Insufficient documentation

## 2016-03-29 DIAGNOSIS — R531 Weakness: Secondary | ICD-10-CM

## 2016-03-29 DIAGNOSIS — Z960 Presence of urogenital implants: Secondary | ICD-10-CM | POA: Insufficient documentation

## 2016-03-29 DIAGNOSIS — I132 Hypertensive heart and chronic kidney disease with heart failure and with stage 5 chronic kidney disease, or end stage renal disease: Secondary | ICD-10-CM | POA: Diagnosis not present

## 2016-03-29 DIAGNOSIS — Z7951 Long term (current) use of inhaled steroids: Secondary | ICD-10-CM

## 2016-03-29 DIAGNOSIS — N2581 Secondary hyperparathyroidism of renal origin: Secondary | ICD-10-CM | POA: Diagnosis present

## 2016-03-29 DIAGNOSIS — D631 Anemia in chronic kidney disease: Secondary | ICD-10-CM | POA: Diagnosis present

## 2016-03-29 DIAGNOSIS — I10 Essential (primary) hypertension: Secondary | ICD-10-CM

## 2016-03-29 DIAGNOSIS — N1330 Unspecified hydronephrosis: Secondary | ICD-10-CM

## 2016-03-29 DIAGNOSIS — J9620 Acute and chronic respiratory failure, unspecified whether with hypoxia or hypercapnia: Secondary | ICD-10-CM | POA: Diagnosis present

## 2016-03-29 DIAGNOSIS — Z8619 Personal history of other infectious and parasitic diseases: Secondary | ICD-10-CM

## 2016-03-29 DIAGNOSIS — I723 Aneurysm of iliac artery: Secondary | ICD-10-CM | POA: Insufficient documentation

## 2016-03-29 DIAGNOSIS — N3644 Muscular disorders of urethra: Secondary | ICD-10-CM

## 2016-03-29 DIAGNOSIS — Z79899 Other long term (current) drug therapy: Secondary | ICD-10-CM

## 2016-03-29 DIAGNOSIS — D649 Anemia, unspecified: Secondary | ICD-10-CM

## 2016-03-29 DIAGNOSIS — F1410 Cocaine abuse, uncomplicated: Secondary | ICD-10-CM | POA: Diagnosis present

## 2016-03-29 DIAGNOSIS — Z992 Dependence on renal dialysis: Secondary | ICD-10-CM

## 2016-03-29 DIAGNOSIS — R41841 Cognitive communication deficit: Secondary | ICD-10-CM

## 2016-03-29 DIAGNOSIS — I4819 Other persistent atrial fibrillation: Secondary | ICD-10-CM

## 2016-03-29 DIAGNOSIS — I5032 Chronic diastolic (congestive) heart failure: Secondary | ICD-10-CM

## 2016-03-29 DIAGNOSIS — N186 End stage renal disease: Secondary | ICD-10-CM | POA: Diagnosis present

## 2016-03-29 DIAGNOSIS — R0602 Shortness of breath: Secondary | ICD-10-CM | POA: Diagnosis not present

## 2016-03-29 NOTE — Patient Instructions (Signed)
Continue weighing daily and call for an overnight weight gain of > 2 pounds or a weekly weight gain of >5 pounds. 

## 2016-03-29 NOTE — Progress Notes (Signed)
Subjective:    Patient ID: [name], male    DOB: Jun 18, 1941, 75 y.o.   MRN: [id number]  Congestive Heart Failure Presents for follow-up visit. The disease course has been stable. Associated symptoms include shortness of breath. Pertinent negatives include no abdominal pain, chest pain, chest pressure, edema, fatigue, orthopnea or palpitations. The symptoms have been stable. Past treatments include beta blockers, oxygen and salt and fluid restriction. The treatment provided moderate relief. Compliance with prior treatments has been good. His past medical history is significant for anemia, arrhythmia, chronic lung disease, CVA and HTN. There is no history of DM. He has one 1st degree relative with heart disease.  Hypertension This is a chronic problem. The current episode started more than 1 year ago. The problem is unchanged. The problem is controlled. Associated symptoms include shortness of breath. Pertinent negatives include no chest pain, headaches, neck pain, palpitations or peripheral edema. There are no associated agents to hypertension. Risk factors for coronary artery disease include family history, dyslipidemia and male gender. Past treatments include beta blockers, diuretics and lifestyle changes. The current treatment provides moderate improvement. Compliance problems include exercise.  Hypertensive end-organ damage includes kidney disease and heart failure.   Past Medical History  Diagnosis Date  . PAF (paroxysmal atrial fibrillation) ([place])     a. new onset 12/2014 in the setting of UTI, sepsis, hypotension, and anemia; b. not on long term anticoagulation given anemia; c. family aware of stroke risk, they are ok with this; d. on amiodarone   . Chronic combined systolic and diastolic CHF (congestive heart failure) ([place])     a. echo 12/2014: EF 25-30%, anterior wall wall motion abnormalities; b. planned ischemic evaluation once patient is stable medically; c. echo 01/2015: EF  45-50%, no RWMA, mild AT, LA mildly dilated, mod pericardial effusion along LV free wall, no evidence of hemodynamic compromise  . ESRD on hemodialysis ([place])     a. Tuesday, Thursday, and Saturdays  . Anemia     a. baseline hgb ~ 8  . History of small bowel obstruction     a. 01/2015  . History of septic shock     a. 01/2015  . GERD (gastroesophageal reflux disease)   . Allergic rhinitis   . COPD (chronic obstructive pulmonary disease) ([place])   . Hypercholesteremia   . Cognitive communication deficit   . Magnesium deficiency   . Dementia   . 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 Incontinence   . Hydronephrosis   . Overactive bladder   . Detrusor sphincter dyssynergia   . Mini stroke ([place])   . Bladder cancer ([place])   . Prostate cancer ([place])   . Hypertension     Past Surgical History  Procedure Laterality Date  . Cystoscopy w/ ureteral stent placement    . Transurethral resection of bladder tumor with gyrus (turbt-gyrus)    . Ureteral stent placement    . Prostate surgery      Removal  . Av fistula placement      left arm  . Peripheral vascular catheterization N/A 01/24/2016    Procedure: Endovascular Repair/Stent Graft;  Surgeon: Katha Cabal, MD;  Location: [facility];  Service: Cardiovascular;  Laterality: N/A;    Family History  Problem Relation Age of Onset  . Stroke Mother     Social History  Substance Use Topics  . Smoking status: Former Smoker -- 0.25 packs/day for 43 years    Quit date: 05/13/2015  . Smokeless tobacco:  Never Used  . Alcohol Use: No    Allergies  Allergen Reactions  . Penicillins Hives, Itching and Swelling    Has patient had a PCN reaction causing immediate rash, facial/tongue/throat swelling, SOB or lightheadedness with hypotension: Yes Has patient had a PCN reaction causing severe rash involving mucus membranes or skin necrosis: No Has patient had a PCN reaction that required hospitalization No Has patient had a PCN reaction occurring  within the last 10 years: No If all of the above answers are "NO", then may proceed with Cephalosporin use.    Prior to Admission medications   Medication Sig Start Date End Date Taking? Authorizing Provider  acetaminophen (TYLENOL) 325 MG tablet Take 650 mg by mouth every 8 (eight) hours as needed. For general discomfort   Yes Historical Provider, MD  albuterol (PROVENTIL) (2.5 MG/3ML) 0.083% nebulizer solution Take 3 mLs (2.5 mg total) by nebulization every 4 (four) hours as needed for wheezing or shortness of breath. 06/10/15  Yes Fritzi Mandes, MD  amiodarone (PACERONE) 200 MG tablet Take 1 tablet (200 mg total) by mouth daily. 09/13/15  Yes Gladstone Lighter, MD  atorvastatin (LIPITOR) 40 MG tablet Take 40 mg by mouth at bedtime.   Yes Historical Provider, MD  budesonide-formoterol (SYMBICORT) 160-4.5 MCG/ACT inhaler Inhale 2 puffs into the lungs 2 (two) times daily.   Yes Historical Provider, MD  carvedilol (COREG) 3.125 MG tablet Take 1 tablet (3.125 mg total) by mouth 2 (two) times daily with a meal. 03/14/16  Yes Gladstone Lighter, MD  ferrous sulfate 325 (65 FE) MG tablet Take 325 mg by mouth daily.   Yes Historical Provider, MD  guaiFENesin (MUCINEX) 600 MG 12 hr tablet Take 600 mg by mouth 2 (two) times daily.   Yes Historical Provider, MD  guaifenesin (ROBITUSSIN) 100 MG/5ML syrup Take 200 mg by mouth every 4 (four) hours as needed for cough.   Yes Historical Provider, MD  ipratropium-albuterol (DUONEB) 0.5-2.5 (3) MG/3ML SOLN Take 3 mLs by nebulization 3 (three) times daily.   Yes Historical Provider, MD  lamoTRIgine (LAMICTAL) 25 MG tablet Take 75 mg by mouth at bedtime.   Yes Historical Provider, MD  LORazepam (ATIVAN) 0.5 MG tablet Take 1 tablet (0.5 mg total) by mouth every 4 (four) hours as needed for anxiety. 03/14/16  Yes Gladstone Lighter, MD  magnesium oxide (MAG-OX) 400 MG tablet Take 1 tablet (400 mg total) by mouth every evening. 09/13/15  Yes Gladstone Lighter, MD  ondansetron  (ZOFRAN ODT) 8 MG disintegrating tablet Take 1 tablet (8 mg total) by mouth every 8 (eight) hours as needed for nausea or vomiting. 11/23/15  Yes Carrie Mew, MD  oxyCODONE (OXY IR/ROXICODONE) 5 MG immediate release tablet Take 1 tablet (5 mg total) by mouth every 6 (six) hours as needed for moderate pain or severe pain. Patient taking differently: Take 5 mg by mouth every 12 (twelve) hours as needed for moderate pain or severe pain.  03/14/16  Yes Gladstone Lighter, MD  polyethylene glycol (MIRALAX / GLYCOLAX) packet Take 17 g by mouth daily as needed for mild constipation, moderate constipation or severe constipation.   Yes Historical Provider, MD  sevelamer carbonate (RENVELA) 800 MG tablet Take 800 mg by mouth 3 (three) times daily before meals.    Yes Historical Provider, MD  tiotropium (SPIRIVA) 18 MCG inhalation capsule Place 1 capsule (18 mcg total) into inhaler and inhale daily. 09/13/15  Yes Gladstone Lighter, MD      Review of Systems  Constitutional: Negative  for appetite change and fatigue.  HENT: Positive for rhinorrhea. Negative for congestion, postnasal drip and sore throat.   Eyes: Negative.   Respiratory: Positive for cough and shortness of breath. Negative for chest tightness and wheezing.   Cardiovascular: Negative for chest pain, palpitations and leg swelling.  Gastrointestinal: Negative for abdominal pain and abdominal distention.  Endocrine: Negative.   Genitourinary: Negative.   Musculoskeletal: Negative for back pain and neck pain.  Skin: Negative.   Allergic/Immunologic: Negative.   Neurological: Positive for weakness ("in legs"). Negative for dizziness, light-headedness and headaches.  Hematological: Negative for adenopathy. Does not bruise/bleed easily.  Psychiatric/Behavioral: Positive for dysphoric mood ("about health issues"). Negative for suicidal ideas and sleep disturbance (sleeping on 1 pillow with oxygen @ 2L around the clock). The patient is not  nervous/anxious.        Objective:   Physical Exam  Constitutional: He is oriented to person, place, and time. He appears well-developed and well-nourished.  HENT:  Head: Normocephalic and atraumatic.  Eyes: Conjunctivae are normal. Pupils are equal, round, and reactive to light.  Neck: Normal range of motion. Neck supple.  Cardiovascular: Normal rate and regular rhythm.   Pulmonary/Chest: Effort normal. He has no wheezes. He has no rales.  Abdominal: Soft. He exhibits no distension. There is no tenderness.  Musculoskeletal: He exhibits no edema or tenderness.  Neurological: He is alert and oriented to person, place, and time.  Skin: Skin is warm and dry.  Psychiatric: He has a normal mood and affect. His behavior is normal. Thought content normal.  Nursing note and vitals reviewed.  BP 116/61 mmHg  Pulse 66  Resp 18  Ht 6\' 2"  (1.88 m)        Assessment & Plan:  1: Chronic heart failure with preserved ejection fraction- Patient presents with shortness of breath upon mild exertion (Class II). He even notices that when he talks a lot that he'll get short of breath but then it quickly improves once he stops talking. He says that he doesn't think he's getting weighed daily although the order does say that it's being done daily. He's unable to say what his weight has been and he's unable to stand to be weighed here. He is not adding any salt to his food and says that no salt is even put on his tray (currently resides at [facility]). No swelling in his legs today.  2: HTN- Blood pressure looks good today. Continue medications at this time. 3: Atrial fibrillation- Currently rate controlled at this time. Is taking amiodarone and carvedilol. 4: Weakness in his legs- He says that he still isn't receiving any physical therapy but does say that he's been evaluated. He's unable to support himself at all on his legs and he's frustrated that more isn't being done in regards to his therapy. He  says that he might as well go home and sit all day instead of "paying them money". Emotional support offered and he was encouraged to continue speaking to the staff and provider there.  Medication list was updated.  Return here in 3 months or sooner for any questions/problems before then.

## 2016-03-30 ENCOUNTER — Emergency Department: Payer: Medicare Other

## 2016-03-30 ENCOUNTER — Inpatient Hospital Stay
Admission: EM | Admit: 2016-03-30 | Discharge: 2016-04-04 | DRG: 291 | Disposition: A | Discharge status: Skilled Nursing Facility | Payer: Medicare Other | Attending: Internal Medicine | Admitting: Internal Medicine

## 2016-03-30 DIAGNOSIS — J189 Pneumonia, unspecified organism: Secondary | ICD-10-CM

## 2016-03-30 DIAGNOSIS — R0603 Acute respiratory distress: Secondary | ICD-10-CM

## 2016-03-30 DIAGNOSIS — I5043 Acute on chronic combined systolic (congestive) and diastolic (congestive) heart failure: Secondary | ICD-10-CM | POA: Diagnosis present

## 2016-03-30 DIAGNOSIS — F0390 Unspecified dementia without behavioral disturbance: Secondary | ICD-10-CM | POA: Diagnosis present

## 2016-03-30 DIAGNOSIS — Z823 Family history of stroke: Secondary | ICD-10-CM | POA: Diagnosis not present

## 2016-03-30 DIAGNOSIS — Z79891 Long term (current) use of opiate analgesic: Secondary | ICD-10-CM | POA: Diagnosis not present

## 2016-03-30 DIAGNOSIS — F1410 Cocaine abuse, uncomplicated: Secondary | ICD-10-CM | POA: Diagnosis present

## 2016-03-30 DIAGNOSIS — N186 End stage renal disease: Secondary | ICD-10-CM | POA: Diagnosis present

## 2016-03-30 DIAGNOSIS — N2581 Secondary hyperparathyroidism of renal origin: Secondary | ICD-10-CM | POA: Diagnosis present

## 2016-03-30 DIAGNOSIS — Z992 Dependence on renal dialysis: Secondary | ICD-10-CM

## 2016-03-30 DIAGNOSIS — Z79899 Other long term (current) drug therapy: Secondary | ICD-10-CM | POA: Diagnosis not present

## 2016-03-30 DIAGNOSIS — Z7951 Long term (current) use of inhaled steroids: Secondary | ICD-10-CM | POA: Diagnosis not present

## 2016-03-30 DIAGNOSIS — E7800 Pure hypercholesterolemia, unspecified: Secondary | ICD-10-CM | POA: Diagnosis present

## 2016-03-30 DIAGNOSIS — J449 Chronic obstructive pulmonary disease, unspecified: Secondary | ICD-10-CM | POA: Diagnosis present

## 2016-03-30 DIAGNOSIS — Z8673 Personal history of transient ischemic attack (TIA), and cerebral infarction without residual deficits: Secondary | ICD-10-CM | POA: Diagnosis not present

## 2016-03-30 DIAGNOSIS — I509 Heart failure, unspecified: Secondary | ICD-10-CM

## 2016-03-30 DIAGNOSIS — R079 Chest pain, unspecified: Secondary | ICD-10-CM | POA: Diagnosis not present

## 2016-03-30 DIAGNOSIS — I480 Paroxysmal atrial fibrillation: Secondary | ICD-10-CM | POA: Diagnosis present

## 2016-03-30 DIAGNOSIS — J90 Pleural effusion, not elsewhere classified: Secondary | ICD-10-CM | POA: Diagnosis present

## 2016-03-30 DIAGNOSIS — Z880 Allergy status to penicillin: Secondary | ICD-10-CM | POA: Diagnosis not present

## 2016-03-30 DIAGNOSIS — Z87891 Personal history of nicotine dependence: Secondary | ICD-10-CM | POA: Diagnosis not present

## 2016-03-30 DIAGNOSIS — R0600 Dyspnea, unspecified: Secondary | ICD-10-CM

## 2016-03-30 DIAGNOSIS — J9620 Acute and chronic respiratory failure, unspecified whether with hypoxia or hypercapnia: Secondary | ICD-10-CM | POA: Diagnosis present

## 2016-03-30 DIAGNOSIS — R0602 Shortness of breath: Secondary | ICD-10-CM

## 2016-03-30 DIAGNOSIS — D631 Anemia in chronic kidney disease: Secondary | ICD-10-CM | POA: Diagnosis present

## 2016-03-30 DIAGNOSIS — Z8551 Personal history of malignant neoplasm of bladder: Secondary | ICD-10-CM | POA: Diagnosis not present

## 2016-03-30 DIAGNOSIS — I5033 Acute on chronic diastolic (congestive) heart failure: Secondary | ICD-10-CM

## 2016-03-30 DIAGNOSIS — Z9889 Other specified postprocedural states: Secondary | ICD-10-CM

## 2016-03-30 DIAGNOSIS — I132 Hypertensive heart and chronic kidney disease with heart failure and with stage 5 chronic kidney disease, or end stage renal disease: Secondary | ICD-10-CM | POA: Diagnosis present

## 2016-03-30 DIAGNOSIS — Z8546 Personal history of malignant neoplasm of prostate: Secondary | ICD-10-CM | POA: Diagnosis not present

## 2016-03-30 DIAGNOSIS — K219 Gastro-esophageal reflux disease without esophagitis: Secondary | ICD-10-CM | POA: Diagnosis present

## 2016-03-30 DIAGNOSIS — I5041 Acute combined systolic (congestive) and diastolic (congestive) heart failure: Secondary | ICD-10-CM

## 2016-03-30 LAB — CBC WITH DIFFERENTIAL/PLATELET
BASOS ABS: 0 10*3/uL (ref 0–0.1)
Basophils Relative: 0 %
EOS PCT: 0 %
Eosinophils Absolute: 0 10*3/uL (ref 0–0.7)
HEMATOCRIT: 26.9 % — AB (ref 40.0–52.0)
HEMOGLOBIN: 8.7 g/dL — AB (ref 13.0–18.0)
LYMPHS ABS: 0.9 10*3/uL — AB (ref 1.0–3.6)
LYMPHS PCT: 7 %
MCH: 28 pg (ref 26.0–34.0)
MCHC: 32.5 g/dL (ref 32.0–36.0)
MCV: 86.1 fL (ref 80.0–100.0)
Monocytes Absolute: 0.7 10*3/uL (ref 0.2–1.0)
Monocytes Relative: 5 %
NEUTROS ABS: 11.6 10*3/uL — AB (ref 1.4–6.5)
NEUTROS PCT: 88 %
Platelets: 189 10*3/uL (ref 150–440)
RBC: 3.12 MIL/uL — AB (ref 4.40–5.90)
RDW: 18.4 % — ABNORMAL HIGH (ref 11.5–14.5)
WBC: 13.2 10*3/uL — AB (ref 3.8–10.6)

## 2016-03-30 LAB — COMPREHENSIVE METABOLIC PANEL
ALBUMIN: 2.7 g/dL — AB (ref 3.5–5.0)
ALK PHOS: 234 U/L — AB (ref 38–126)
ALT: 12 U/L — AB (ref 17–63)
ANION GAP: 9 (ref 5–15)
AST: 16 U/L (ref 15–41)
BUN: 46 mg/dL — ABNORMAL HIGH (ref 6–20)
CALCIUM: 8.4 mg/dL — AB (ref 8.9–10.3)
CHLORIDE: 98 mmol/L — AB (ref 101–111)
CO2: 32 mmol/L (ref 22–32)
Creatinine, Ser: 4.06 mg/dL — ABNORMAL HIGH (ref 0.61–1.24)
GFR calc Af Amer: 15 mL/min — ABNORMAL LOW (ref >60)
GFR calc non Af Amer: 13 mL/min — ABNORMAL LOW (ref >60)
GLUCOSE: 116 mg/dL — AB (ref 65–99)
Potassium: 3.6 mmol/L (ref 3.5–5.1)
SODIUM: 139 mmol/L (ref 135–145)
Total Bilirubin: 1.2 mg/dL (ref 0.3–1.2)
Total Protein: 6 g/dL — ABNORMAL LOW (ref 6.5–8.1)

## 2016-03-30 LAB — BRAIN NATRIURETIC PEPTIDE: B Natriuretic Peptide: >4500 pg/mL — ABNORMAL HIGH (ref 0.0–100.0)

## 2016-03-30 LAB — BLOOD GAS, ARTERIAL
Acid-Base Excess: 10 mmol/L — ABNORMAL HIGH (ref 0.0–3.0)
BICARBONATE: 33.2 meq/L — AB (ref 21.0–28.0)
FIO2: 28
O2 Saturation: 98.1 %
PH ART: 7.55 — AB (ref 7.350–7.450)
Patient temperature: 37
pCO2 arterial: 38 mmHg (ref 32.0–48.0)
pO2, Arterial: 93 mmHg (ref 83.0–108.0)

## 2016-03-30 LAB — LACTIC ACID, PLASMA
LACTIC ACID, VENOUS: 0.9 mmol/L (ref 0.5–1.9)
Lactic Acid, Venous: 0.9 mmol/L (ref 0.5–1.9)

## 2016-03-30 LAB — TROPONIN I
TROPONIN I: 0.03 ng/mL — AB (ref <0.03)
Troponin I: <0.03 ng/mL (ref <0.03)
Troponin I: 0.04 ng/mL (ref <0.03)

## 2016-03-30 LAB — PHOSPHORUS: PHOSPHORUS: 1.8 mg/dL — AB (ref 2.5–4.6)

## 2016-03-30 MED ORDER — HEPARIN SODIUM (PORCINE) 5000 UNIT/ML IJ SOLN
5000.0000 [IU] | Freq: Three times a day (TID) | INTRAMUSCULAR | Status: DC
  Administered 2016-03-30 – 2016-04-04 (×14): 5000 [IU] via SUBCUTANEOUS
  Filled 2016-03-30 (×13): qty 1

## 2016-03-30 MED ORDER — LAMOTRIGINE 25 MG PO TABS
25.0000 mg | ORAL_TABLET | Freq: Every day | ORAL | Status: DC
  Administered 2016-03-30 – 2016-04-03 (×5): 25 mg via ORAL
  Filled 2016-03-30 (×6): qty 1

## 2016-03-30 MED ORDER — GUAIFENESIN ER 600 MG PO TB12
600.0000 mg | ORAL_TABLET | Freq: Two times a day (BID) | ORAL | Status: DC
  Administered 2016-03-30 – 2016-04-04 (×9): 600 mg via ORAL
  Filled 2016-03-30 (×9): qty 1

## 2016-03-30 MED ORDER — ONDANSETRON HCL 4 MG PO TABS: 4.0000 mg | ORAL_TABLET | Freq: Four times a day (QID) | ORAL | Status: DC | PRN

## 2016-03-30 MED ORDER — ATORVASTATIN CALCIUM 20 MG PO TABS
40.0000 mg | ORAL_TABLET | Freq: Every day | ORAL | Status: DC
  Administered 2016-03-30 – 2016-04-03 (×5): 40 mg via ORAL
  Filled 2016-03-30 (×5): qty 2

## 2016-03-30 MED ORDER — ONDANSETRON HCL 4 MG/2ML IJ SOLN: 4.0000 mg | Freq: Four times a day (QID) | INTRAMUSCULAR | Status: DC | PRN

## 2016-03-30 MED ORDER — TIOTROPIUM BROMIDE MONOHYDRATE 18 MCG IN CAPS
18.0000 ug | ORAL_CAPSULE | Freq: Every day | RESPIRATORY_TRACT | Status: DC
  Administered 2016-03-31 – 2016-04-04 (×4): 18 ug via RESPIRATORY_TRACT
  Filled 2016-03-30: qty 5

## 2016-03-30 MED ORDER — MOMETASONE FURO-FORMOTEROL FUM 200-5 MCG/ACT IN AERO
2.0000 | INHALATION_SPRAY | Freq: Two times a day (BID) | RESPIRATORY_TRACT | Status: DC
  Administered 2016-03-30 – 2016-04-04 (×10): 2 via RESPIRATORY_TRACT
  Filled 2016-03-30: qty 8.8

## 2016-03-30 MED ORDER — AMIODARONE HCL 200 MG PO TABS
200.0000 mg | ORAL_TABLET | Freq: Every day | ORAL | Status: DC
  Administered 2016-03-30 – 2016-04-04 (×6): 200 mg via ORAL
  Filled 2016-03-30 (×6): qty 1

## 2016-03-30 MED ORDER — LIDOCAINE HCL (PF) 1 % IJ SOLN
5.0000 mL | INTRAMUSCULAR | Status: DC | PRN
  Filled 2016-03-30: qty 5

## 2016-03-30 MED ORDER — FAMOTIDINE IN NACL 20-0.9 MG/50ML-% IV SOLN
20.0000 mg | Freq: Two times a day (BID) | INTRAVENOUS | Status: DC
  Administered 2016-03-30 – 2016-03-31 (×3): 20 mg via INTRAVENOUS
  Filled 2016-03-30 (×5): qty 50

## 2016-03-30 MED ORDER — SODIUM CHLORIDE 0.9 % IV SOLN
500.0000 mg | INTRAVENOUS | Status: DC
  Administered 2016-03-30 – 2016-04-01 (×3): 500 mg via INTRAVENOUS
  Filled 2016-03-30 (×4): qty 0.5

## 2016-03-30 MED ORDER — ALTEPLASE 2 MG IJ SOLR
2.0000 mg | Freq: Once | INTRAMUSCULAR | Status: DC | PRN
  Filled 2016-03-30: qty 2

## 2016-03-30 MED ORDER — SODIUM CHLORIDE 0.9 % IV SOLN: 100.0000 mL | INTRAVENOUS | Status: DC | PRN

## 2016-03-30 MED ORDER — ACETAMINOPHEN 325 MG PO TABS: 650.0000 mg | ORAL_TABLET | Freq: Four times a day (QID) | ORAL | Status: DC | PRN

## 2016-03-30 MED ORDER — MORPHINE SULFATE (PF) 2 MG/ML IV SOLN
2.0000 mg | INTRAVENOUS | Status: DC | PRN
  Administered 2016-03-31: 2 mg via INTRAVENOUS
  Filled 2016-03-30: qty 1

## 2016-03-30 MED ORDER — LAMOTRIGINE 25 MG PO TABS: 50.0000 mg | ORAL_TABLET | Freq: Every day | ORAL | Status: DC

## 2016-03-30 MED ORDER — NITROGLYCERIN 2 % TD OINT
0.5000 [in_us] | TOPICAL_OINTMENT | Freq: Three times a day (TID) | TRANSDERMAL | Status: DC
  Administered 2016-03-30 – 2016-04-04 (×12): 0.5 [in_us] via TOPICAL
  Filled 2016-03-30: qty 0.5
  Filled 2016-03-30 (×2): qty 1
  Filled 2016-03-30 (×2): qty 0.5
  Filled 2016-03-30 (×5): qty 1
  Filled 2016-03-30: qty 0.5
  Filled 2016-03-30: qty 1
  Filled 2016-03-30: qty 0.5
  Filled 2016-03-30: qty 1

## 2016-03-30 MED ORDER — SEVELAMER CARBONATE 800 MG PO TABS
800.0000 mg | ORAL_TABLET | Freq: Three times a day (TID) | ORAL | Status: DC
  Administered 2016-03-31 – 2016-04-04 (×12): 800 mg via ORAL
  Filled 2016-03-30 (×12): qty 1

## 2016-03-30 MED ORDER — NITROGLYCERIN 2 % TD OINT
0.5000 [in_us] | TOPICAL_OINTMENT | Freq: Three times a day (TID) | TRANSDERMAL | Status: DC
  Filled 2016-03-30: qty 30

## 2016-03-30 MED ORDER — ACETAMINOPHEN 650 MG RE SUPP: 650.0000 mg | Freq: Four times a day (QID) | RECTAL | Status: DC | PRN

## 2016-03-30 MED ORDER — HEPARIN SODIUM (PORCINE) 1000 UNIT/ML DIALYSIS
1000.0000 [IU] | INTRAMUSCULAR | Status: DC | PRN
  Filled 2016-03-30: qty 1

## 2016-03-30 MED ORDER — FERROUS SULFATE 325 (65 FE) MG PO TABS
325.0000 mg | ORAL_TABLET | Freq: Every day | ORAL | Status: DC
  Administered 2016-03-30 – 2016-04-04 (×6): 325 mg via ORAL
  Filled 2016-03-30 (×6): qty 1

## 2016-03-30 MED ORDER — LIDOCAINE-PRILOCAINE 2.5-2.5 % EX CREA
1.0000 "application " | TOPICAL_CREAM | CUTANEOUS | Status: DC | PRN
  Filled 2016-03-30: qty 5

## 2016-03-30 MED ORDER — MAGNESIUM OXIDE 400 (241.3 MG) MG PO TABS
400.0000 mg | ORAL_TABLET | Freq: Every evening | ORAL | Status: DC
  Administered 2016-03-30 – 2016-04-03 (×5): 400 mg via ORAL
  Filled 2016-03-30 (×5): qty 1

## 2016-03-30 MED ORDER — IPRATROPIUM-ALBUTEROL 0.5-2.5 (3) MG/3ML IN SOLN
3.0000 mL | Freq: Four times a day (QID) | RESPIRATORY_TRACT | Status: DC
Start: 2016-03-30 — End: 2016-04-04
  Administered 2016-03-30 – 2016-04-04 (×17): 3 mL via RESPIRATORY_TRACT
  Filled 2016-03-30 (×18): qty 3

## 2016-03-30 MED ORDER — LORAZEPAM 0.5 MG PO TABS
0.5000 mg | ORAL_TABLET | ORAL | Status: DC | PRN
  Administered 2016-04-01 – 2016-04-02 (×5): 0.5 mg via ORAL
  Filled 2016-03-30 (×5): qty 1

## 2016-03-30 MED ORDER — PENTAFLUOROPROP-TETRAFLUOROETH EX AERO
1.0000 "application " | INHALATION_SPRAY | CUTANEOUS | Status: DC | PRN
  Filled 2016-03-30: qty 30

## 2016-03-30 MED ORDER — OXYCODONE HCL 5 MG PO TABS
5.0000 mg | ORAL_TABLET | Freq: Four times a day (QID) | ORAL | Status: DC | PRN
  Administered 2016-04-03: 5 mg via ORAL
  Filled 2016-03-30: qty 1

## 2016-03-30 MED ORDER — CARVEDILOL 3.125 MG PO TABS
3.1250 mg | ORAL_TABLET | Freq: Two times a day (BID) | ORAL | Status: DC
  Administered 2016-03-30 – 2016-04-03 (×8): 3.125 mg via ORAL
  Filled 2016-03-30 (×9): qty 1

## 2016-03-30 MED ORDER — SODIUM CHLORIDE 0.9 % IV SOLN
500.0000 mg | Freq: Once | INTRAVENOUS | Status: AC
  Administered 2016-03-30: 500 mg via INTRAVENOUS
  Filled 2016-03-30: qty 0.5

## 2016-03-30 MED ORDER — BISACODYL 10 MG RE SUPP
10.0000 mg | Freq: Every day | RECTAL | Status: DC | PRN
  Administered 2016-04-03: 10 mg via RECTAL
  Filled 2016-03-30: qty 1

## 2016-03-30 MED ORDER — ASPIRIN EC 81 MG PO TBEC
81.0000 mg | DELAYED_RELEASE_TABLET | Freq: Every day | ORAL | Status: DC
  Administered 2016-03-30 – 2016-04-02 (×4): 81 mg via ORAL
  Filled 2016-03-30 (×4): qty 1

## 2016-03-30 MED ORDER — DOCUSATE SODIUM 100 MG PO CAPS
100.0000 mg | ORAL_CAPSULE | Freq: Two times a day (BID) | ORAL | Status: DC
  Administered 2016-03-30 – 2016-04-01 (×6): 100 mg via ORAL
  Filled 2016-03-30 (×6): qty 1

## 2016-03-30 NOTE — Progress Notes (Signed)
Notified Dr Ether Griffins of pt troponin of 0.04; Dr acknowledged, no new orders, stated she would review pt chart

## 2016-03-30 NOTE — Progress Notes (Signed)
PRE DIALYSIS ASSESSMENT 

## 2016-03-30 NOTE — ED Notes (Signed)
Pt presents via EMS c/o SOB during dialysis. Apx 1.5 hrs into dialysis. Denies chest pain. Pt resident at [facility]

## 2016-03-30 NOTE — Consult Note (Signed)
Pharmacy Antibiotic Note  [name] is a 75 y.o. male admitted on 03/30/2016 with pneumonia.  Pharmacy has been consulted for meropenem dosing. Pt with PNC allergy, per review of chart, pt has been on meropenem before. Pt is an ESRD pt on HD Tue, Thus,Sat  Plan: meropenem 500mg  q 24 hours. Will schedule dose for the evening as it needs to be given AFTER HD   Pt received 500mg  in the ED at 1223. Because it is dialyzable, pt will need an additional dose AFTER HD today.   Height: 6\' 2"  (188 cm) Weight: 180 lb (81.647 kg) IBW/kg (Calculated) : 82.2  Temp (24hrs), Avg:98.7 F (37.1 C), Min:98.7 F (37.1 C), Max:98.7 F (37.1 C)   Recent Labs Lab 03/30/16 0935 03/30/16 1153  WBC 13.2*  --   CREATININE 4.06*  --   LATICACIDVEN  --  0.9    Estimated Creatinine Clearance: 18.4 mL/min (by C-G formula based on Cr of 4.06).    Allergies  Allergen Reactions  . Penicillins Hives, Itching and Swelling    Has patient had a PCN reaction causing immediate rash, facial/tongue/throat swelling, SOB or lightheadedness with hypotension: Yes Has patient had a PCN reaction causing severe rash involving mucus membranes or skin necrosis: No Has patient had a PCN reaction that required hospitalization No Has patient had a PCN reaction occurring within the last 10 years: No If all of the above answers are "NO", then may proceed with Cephalosporin use.    Antimicrobials this admission: meropenem 7/8 >>   Dose adjustments this admission:   Microbiology results:  7/8 UCx: pending    Thank you for allowing pharmacy to be a part of this patient's care.  Ramond Dial 03/30/2016 1:35 PM

## 2016-03-30 NOTE — H&P (Signed)
History and Physical    ARGO MCCOWIN [id number] DOB: [birth year] DOA: 03/30/2016  Referring physician: Dr. Cinda Quest PCP: Dion Body, MD  Specialists: none  Chief Complaint: SOB  HPI: [name] is a 75 y.o. male has a past medical history significant for ESRD on HD and chronic diastolic CHF on 2L O2 per [place] now with acute worsening of SOB. Was brought to the ER where the pt was in obvious respiratory distress. CXR shows LLL pneumonia and worsening CHF. He denies fever or CP. Sputum is "light-colored".  No N/V/D. He is now admitted. The pt's last HD was Thursday.  Review of Systems: The patient denies anorexia, fever, weight loss,, vision loss, decreased hearing, hoarseness, chest pain, syncope, peripheral edema, balance deficits, hemoptysis, abdominal pain, melena, hematochezia, severe indigestion/heartburn, hematuria, incontinence, genital sores, muscle weakness, suspicious skin lesions, transient blindness, difficulty walking, depression, unusual weight change, abnormal bleeding, enlarged lymph nodes, angioedema, and breast masses.   Past Medical History  Diagnosis Date  . PAF (paroxysmal atrial fibrillation) ([place])     a. new onset 12/2014 in the setting of UTI, sepsis, hypotension, and anemia; b. not on long term anticoagulation given anemia; c. family aware of stroke risk, they are ok with this; d. on amiodarone   . Chronic combined systolic and diastolic CHF (congestive heart failure) ([place])     a. echo 12/2014: EF 25-30%, anterior wall wall motion abnormalities; b. planned ischemic evaluation once patient is stable medically; c. echo 01/2015: EF 45-50%, no RWMA, mild AT, LA mildly dilated, mod pericardial effusion along LV free wall, no evidence of hemodynamic compromise  . ESRD on hemodialysis ([place])     a. Tuesday, Thursday, and Saturdays  . Anemia     a. baseline hgb ~ 8  . History of small bowel obstruction     a. 01/2015  . History of septic shock     a. 01/2015   . GERD (gastroesophageal reflux disease)   . Allergic rhinitis   . COPD (chronic obstructive pulmonary disease) ([place])   . Hypercholesteremia   . Cognitive communication deficit   . Magnesium deficiency   . Dementia   . 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 Incontinence   . Hydronephrosis   . Overactive bladder   . Detrusor sphincter dyssynergia   . Mini stroke ([place])   . Bladder cancer ([place])   . Prostate cancer ([place])   . Hypertension    Past Surgical History  Procedure Laterality Date  . Cystoscopy w/ ureteral stent placement    . Transurethral resection of bladder tumor with gyrus (turbt-gyrus)    . Ureteral stent placement    . Prostate surgery      Removal  . Av fistula placement      left arm  . Peripheral vascular catheterization N/A 01/24/2016    Procedure: Endovascular Repair/Stent Graft;  Surgeon: Katha Cabal, MD;  Location: [facility];  Service: Cardiovascular;  Laterality: N/A;   Social History:  reports that he quit smoking about 10 months ago. He has never used smokeless tobacco. He reports that he does not drink alcohol or use illicit drugs.  Allergies  Allergen Reactions  . Penicillins Hives, Itching and Swelling    Has patient had a PCN reaction causing immediate rash, facial/tongue/throat swelling, SOB or lightheadedness with hypotension: Yes Has patient had a PCN reaction causing severe rash involving mucus membranes or skin necrosis: No Has patient had a PCN reaction that required hospitalization No Has patient  had a PCN reaction occurring within the last 10 years: No If all of the above answers are "NO", then may proceed with Cephalosporin use.    Family History  Problem Relation Age of Onset  . Stroke Mother     Prior to Admission medications   Medication Sig Start Date End Date Taking? Authorizing Provider  acetaminophen (TYLENOL) 325 MG tablet Take 650 mg by mouth every 8 (eight) hours as needed. For general discomfort   Yes Historical Provider,  MD  albuterol (PROVENTIL) (2.5 MG/3ML) 0.083% nebulizer solution Take 3 mLs (2.5 mg total) by nebulization every 4 (four) hours as needed for wheezing or shortness of breath. 06/10/15  Yes Fritzi Mandes, MD  amiodarone (PACERONE) 200 MG tablet Take 1 tablet (200 mg total) by mouth daily. 09/13/15  Yes Gladstone Lighter, MD  atorvastatin (LIPITOR) 40 MG tablet Take 40 mg by mouth at bedtime.   Yes Historical Provider, MD  budesonide-formoterol (SYMBICORT) 160-4.5 MCG/ACT inhaler Inhale 2 puffs into the lungs 2 (two) times daily.   Yes Historical Provider, MD  carvedilol (COREG) 3.125 MG tablet Take 1 tablet (3.125 mg total) by mouth 2 (two) times daily with a meal. 03/14/16  Yes Gladstone Lighter, MD  ferrous sulfate 325 (65 FE) MG tablet Take 325 mg by mouth daily.   Yes Historical Provider, MD  guaiFENesin (MUCINEX) 600 MG 12 hr tablet Take 600 mg by mouth 2 (two) times daily. (Take every morning and at bedtime for cough)   Yes Historical Provider, MD  guaifenesin (ROBITUSSIN) 100 MG/5ML syrup Take 200 mg by mouth every 4 (four) hours as needed for cough.   Yes Historical Provider, MD  ipratropium-albuterol (DUONEB) 0.5-2.5 (3) MG/3ML SOLN Take 3 mLs by nebulization 3 (three) times daily.   Yes Historical Provider, MD  lamoTRIgine (LAMICTAL) 25 MG tablet Take 25 mg by mouth at bedtime. X 7 days, then increase to 2 tablets (50 mg) by mouth at bedtime. 03/28/16 04/04/16 Yes Historical Provider, MD  lamoTRIgine (LAMICTAL) 25 MG tablet Take 50 mg by mouth at bedtime. Start on July 14th. 04/05/16  Yes Historical Provider, MD  LORazepam (ATIVAN) 0.5 MG tablet Take 1 tablet (0.5 mg total) by mouth every 4 (four) hours as needed for anxiety. Patient taking differently: Take 0.25-0.5 mg by mouth every 4 (four) hours as needed for anxiety.  03/14/16  Yes Gladstone Lighter, MD  magnesium oxide (MAG-OX) 400 MG tablet Take 1 tablet (400 mg total) by mouth every evening. 09/13/15  Yes Gladstone Lighter, MD  ondansetron  (ZOFRAN) 8 MG tablet Take by mouth every 8 (eight) hours as needed for nausea.   Yes Historical Provider, MD  oxyCODONE (OXY IR/ROXICODONE) 5 MG immediate release tablet Take 1 tablet (5 mg total) by mouth every 6 (six) hours as needed for moderate pain or severe pain. Patient taking differently: Take 5 mg by mouth every 12 (twelve) hours as needed for moderate pain or severe pain.  03/14/16  Yes Gladstone Lighter, MD  OXYGEN Inhale 2-3 L into the lungs as needed. For shortness of breath, respiratory distress or O2 sats <90.   Yes Historical Provider, MD  POLYETHYLENE GLYCOL 3350 PO Take 17 g by mouth daily as needed. For constipation. *Mix 1 capful in 6-8 ounces of beverage*   Yes Historical Provider, MD  sevelamer carbonate (RENVELA) 800 MG tablet Take 800 mg by mouth 3 (three) times daily before meals.    Yes Historical Provider, MD  tiotropium (SPIRIVA) 18 MCG inhalation capsule Place 1  capsule (18 mcg total) into inhaler and inhale daily. 09/13/15  Yes Gladstone Lighter, MD  ondansetron (ZOFRAN ODT) 8 MG disintegrating tablet Take 1 tablet (8 mg total) by mouth every 8 (eight) hours as needed for nausea or vomiting. 11/23/15   Carrie Mew, MD   Physical Exam: Filed Vitals:   03/30/16 [id number] 03/30/16 0927 03/30/16 1030  BP: 139/74  131/77  Pulse: 81    Temp: 98.7 F (37.1 C)    TempSrc: Oral    Resp: 40  29  Height: 6\' 2"  (1.88 m)    Weight: 81.647 kg (180 lb)    SpO2: 100% 100%      General:  WDWN, [place]/AT, acutely ill appearing in moderate respiratory distress  Eyes: PERRL, EOMI, no scleral icterus, conjunctiva pale  ENT: moist oropharynx without lesions or exudate, TM's benign, dentition poor  Neck: supple, no lymphadenopathy. No thyromegaly or bruits  Cardiovascular: regular rate with 2/6 SEM noted. No rubs or gallops.; 2+ peripheral pulses, positive JVD, no peripheral edema  Respiratory: decreased breath sounds at the bases with bilateral rales 1/3 up without wheezes.  Respiratory effort increased  Abdomen: soft, non tender to palpation, positive bowel sounds, no guarding, no rebound  Skin: no rashes or lesions  Musculoskeletal: normal bulk and tone, no joint swelling  Psychiatric: normal mood and affect, A&OX3  Neurologic: CN 2-12 grossly intact, Motor strength 5/5 in all 4 groups with symmetric DTR's and non-focal sensory exam  Labs on Admission:  Basic Metabolic Panel:  Recent Labs Lab 03/30/16 0935  NA 139  K 3.6  CL 98*  CO2 32  GLUCOSE 116*  BUN 46*  CREATININE 4.06*  CALCIUM 8.4*   Liver Function Tests:  Recent Labs Lab 03/30/16 0935  AST 16  ALT 12*  ALKPHOS 234*  BILITOT 1.2  PROT 6.0*  ALBUMIN 2.7*   No results for input(s): LIPASE, AMYLASE in the last 168 hours. No results for input(s): AMMONIA in the last 168 hours. CBC:  Recent Labs Lab 03/30/16 0935  WBC 13.2*  NEUTROABS 11.6*  HGB 8.7*  HCT 26.9*  MCV 86.1  PLT 189   Cardiac Enzymes:  Recent Labs Lab 03/30/16 0935  TROPONINI 0.03*    BNP (last 3 results)  Recent Labs  03/12/16 1137 03/30/16 0935  BNP >4500.0* >4500.0*    ProBNP (last 3 results) No results for input(s): PROBNP in the last 8760 hours.  CBG: No results for input(s): GLUCAP in the last 168 hours.  Radiological Exams on Admission: Dg Chest Portable 1 View  03/30/2016  CLINICAL DATA:  Shortness of breath.  End-stage renal disease EXAM: PORTABLE CHEST 1 VIEW COMPARISON:  March 12, 2016 FINDINGS: There is persistent airspace consolidation in the left lower lobe with small left effusion. There is a small right effusion with subsegmental atelectasis on the right. There is mild underlying interstitial edema, stable. There is cardiomegaly with mild pulmonary venous hypertension. There is atherosclerotic calcification in the aorta. The aorta is mildly prominent but stable. No adenopathy evident. No bone lesions. IMPRESSION: There is pulmonary vascular congestion with congestive heart  failure. Suspect superimposed pneumonia left lower lobe, although the opacity in the left lower lobe could be due to alveolar edema. There is aortic atherosclerosis. Aortic prominence is stable and likely reflective of chronic hypertension. There is no new opacity. No change in cardiac silhouette. Electronically Signed   By: Lowella Grip III M.D.   On: 03/30/2016 09:57    EKG: Independently reviewed.  Assessment/Plan Principal  Problem:   Acute on chronic diastolic (congestive) heart failure (HCC) Active Problems:   ESRD on hemodialysis ([place])   Acute respiratory distress ([place])   HCAP (healthcare-associated pneumonia)   Will admit to floor with O2, SVN's, and IV ABX. Consult Nephrology for urgent HD. Check echo and follow enzymes. Cardiology consult for CHF. Repeat labs and CXR in AM.  Diet: low salt Fluids: saline lock DVT Prophylaxis: SQ Heparin  Code Status: FULL  Family Communication: none  Disposition Plan: SNF  Time spent: 50 min

## 2016-03-30 NOTE — ED Provider Notes (Addendum)
[facility] Emergency Department Provider Note   ____________________________________________  Time seen: Approximately 9:40 AM  I have reviewed the triage vital signs and the nursing notes.   HISTORY  Chief Complaint Shortness of Breath    HPI [name] is a 75 y.o. male who came in because of developing shortness of breath halfway through his dialysis today. He ports he just became short of breath he did not have any coughing he has no chest pain or discomfort at all. He said this is not the first time this has happened. He cannot explain to me what happened in the past. Patient reports he is not short of breath present. O2 sats on 2 L which is what he usually uses 98%.   Past Medical History  Diagnosis Date  . PAF (paroxysmal atrial fibrillation) ([place])     a. new onset 12/2014 in the setting of UTI, sepsis, hypotension, and anemia; b. not on long term anticoagulation given anemia; c. family aware of stroke risk, they are ok with this; d. on amiodarone   . Chronic combined systolic and diastolic CHF (congestive heart failure) ([place])     a. echo 12/2014: EF 25-30%, anterior wall wall motion abnormalities; b. planned ischemic evaluation once patient is stable medically; c. echo 01/2015: EF 45-50%, no RWMA, mild AT, LA mildly dilated, mod pericardial effusion along LV free wall, no evidence of hemodynamic compromise  . ESRD on hemodialysis ([place])     a. Tuesday, Thursday, and Saturdays  . Anemia     a. baseline hgb ~ 8  . History of small bowel obstruction     a. 01/2015  . History of septic shock     a. 01/2015  . GERD (gastroesophageal reflux disease)   . Allergic rhinitis   . COPD (chronic obstructive pulmonary disease) ([place])   . Hypercholesteremia   . Cognitive communication deficit   . Magnesium deficiency   . Dementia   . 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 Incontinence   . Hydronephrosis   . Overactive bladder   . Detrusor sphincter dyssynergia     . Mini stroke ([place])   . Bladder cancer ([place])   . Prostate cancer ([place])   . Hypertension     Patient Active Problem List   Diagnosis Date Noted  . Acute respiratory distress (HCC) 03/30/2016  . Acute on chronic diastolic (congestive) heart failure ([place]) 03/30/2016  . HCAP (healthcare-associated pneumonia) 03/30/2016  . History of prostate cancer 02/12/2016  . SOB (shortness of breath)   . COPD (chronic obstructive pulmonary disease) ([place])   . 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 ESRD on dialysis ([place])   . 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 COPD (chronic obstructive pulmonary disease) with chronic bronchitis ([place]) 11/06/2015  . Chronic diastolic heart failure ([place]) 08/11/2015  . Generalized weakness 05/08/2015  . Other emphysema ([place]) 05/08/2015  . Tobacco use disorder 05/08/2015  . Hypokalemia 05/08/2015  . Pressure ulcer 05/05/2015  . Symptomatic anemia 05/04/2015  . CAFL (chronic airflow limitation) ([place]) 04/10/2015  . Malnutrition of moderate degree ([place]) 03/22/2015  . Pneumonia 03/20/2015  . History of sepsis 02/27/2015  . Incontinence 02/27/2015  . Persistent atrial fibrillation ([place]) 02/27/2015  . Preoperative cardiovascular examination 02/27/2015  . ESRD on hemodialysis ([place])   . Anemia   . History of small bowel obstruction   . Chronic kidney disease (CKD), stage IV (severe) ([place]) 03/01/2014  . Essential (primary) hypertension 03/01/2014  . 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 Cystitis 11/11/2012  . Hydronephrosis 08/27/2012  . Bladder cancer ([place]) 08/27/2012  . CA of prostate ([place]) 08/27/2012  . Bone metastases ([place]) 08/27/2012    Past Surgical History  Procedure Laterality Date  . Cystoscopy w/ ureteral stent placement    . Transurethral resection of bladder tumor with gyrus (turbt-gyrus)    . Ureteral stent placement    . Prostate surgery      Removal  . Av fistula placement      left arm  . Peripheral vascular catheterization N/A  01/24/2016    Procedure: Endovascular Repair/Stent Graft;  Surgeon: Katha Cabal, MD;  Location: [facility];  Service: Cardiovascular;  Laterality: N/A;    No current outpatient prescriptions on file.  Allergies Penicillins  Family History  Problem Relation Age of Onset  . Stroke Mother     Social History Social History  Substance Use Topics  . Smoking status: Former Smoker -- 0.25 packs/day for 20 years    Quit date: 05/13/2015  . Smokeless tobacco: Never Used  . Alcohol Use: No    Review of Systems Constitutional: No fever/chills Eyes: No visual changes. ENT: No sore throat. Cardiovascular: Denies chest pain. Respiratory: Denies shortness of breath At present. Gastrointestinal: No abdominal pain.  No nausea, no vomiting.  No diarrhea.  No constipation. Genitourinary: Negative for dysuria. Musculoskeletal: Negative for back pain. Skin: Negative for rash. Neurological: Negative for headaches, focal weakness or numbness.  10-point ROS otherwise negative.  ____________________________________________   PHYSICAL EXAM:  VITAL SIGNS: ED Triage Vitals  Enc Vitals Group     BP 03/30/16 0925 139/74 mmHg     Pulse Rate 03/30/16 0925 81     Resp 03/30/16 0925 40     Temp 03/30/16 0925 98.7 F (37.1 C)     Temp Source 03/30/16 0925 Oral     SpO2 03/30/16 0925 100 %     Weight 03/30/16 0925 180 lb (81.647 kg)     Height 03/30/16 0925 6\' 2"  (1.88 m)     Head Cir --      Peak Flow --      Pain Score --      Pain Loc --      Pain Edu? --      Excl. in [place]? --     Constitutional: Alert and oriented. Well appearing and in no acute distress. Eyes: Conjunctivae are normal. PERRL. EOMI. Head: Atraumatic. Nose: No congestion/rhinnorhea. Mouth/Throat: Mucous membranes are moist.  Oropharynx non-erythematous. Neck: No stridor.  Cardiovascular: Normal rate, regular rhythm. Grossly normal heart sounds.  Good peripheral circulation. Respiratory: Normal  respiratory effort.  No retractions. Lungs CTAB. Gastrointestinal: Soft and nontender. No distention. No abdominal bruits. No CVA tenderness. Musculoskeletal: No lower extremity tenderness nor edema.  No joint effusions. Neurologic:  Normal speech and language. No gross focal neurologic deficits are appreciated. No gait instability. Skin:  Skin is warm, dry and intact. No rash noted.   ____________________________________________   LABS (all labs ordered are listed, but only abnormal results are displayed)  Labs Reviewed  COMPREHENSIVE METABOLIC PANEL - Abnormal; Notable for the following:    Chloride 98 (*)    Glucose, Bld 116 (*)    BUN 46 (*)    Creatinine, Ser 4.06 (*)    Calcium 8.4 (*)    Total Protein 6.0 (*)    Albumin 2.7 (*)    ALT 12 (*)    Alkaline Phosphatase 234 (*)    GFR  calc non Af Amer 13 (*)    GFR calc Af Amer 15 (*)    All other components within normal limits  BRAIN NATRIURETIC PEPTIDE - Abnormal; Notable for the following:    B Natriuretic Peptide >4500.0 (*)    All other components within normal limits  TROPONIN I - Abnormal; Notable for the following:    Troponin I 0.03 (*)    All other components within normal limits  CBC WITH DIFFERENTIAL/PLATELET - Abnormal; Notable for the following:    WBC 13.2 (*)    RBC 3.12 (*)    Hemoglobin 8.7 (*)    HCT 26.9 (*)    RDW 18.4 (*)    Neutro Abs 11.6 (*)    Lymphs Abs 0.9 (*)    All other components within normal limits  BLOOD GAS, ARTERIAL - Abnormal; Notable for the following:    pH, Arterial 7.55 (*)    Bicarbonate 33.2 (*)    Acid-Base Excess 10.0 (*)    All other components within normal limits  URINE CULTURE  TROPONIN I  LACTIC ACID, PLASMA  LACTIC ACID, PLASMA  TROPONIN I  TROPONIN I  HEPATITIS B SURFACE ANTIGEN  PHOSPHORUS  PARATHYROID HORMONE, INTACT (NO CA)  COMPREHENSIVE METABOLIC PANEL  CBC    ____________________________________________  EKG   ____________________________________________  RADIOLOGY  Chest x-ray is read by radiology as congestive failure with a left-sided pneumonia ____________________________________________   PROCEDURES    Procedures    ____________________________________________   INITIAL IMPRESSION / ASSESSMENT AND PLAN / ED COURSE  Pertinent labs & imaging results that were available during my care of the patient were reviewed by me and considered in my medical decision making (see chart for details).  Discussed with renal and with hospitalist we will admit the patient is very to Subjectively short of breath was unable to keep complete dialysis and has congestive failure he will need dialysis at least 2 improve his congestive failure. ____________________________________________   FINAL CLINICAL IMPRESSION(S) / ED DIAGNOSES  Final diagnoses:  Healthcare-associated pneumonia  Acute combined systolic and diastolic congestive heart failure ([place])  Dyspnea      NEW MEDICATIONS STARTED DURING THIS VISIT:  Current Discharge Medication List       Note:  This document was prepared using Dragon voice recognition software and may include unintentional dictation errors.    Nena Polio, MD 03/30/16 [place], MD 04/11/16 2155

## 2016-03-31 ENCOUNTER — Inpatient Hospital Stay (HOSPITAL_COMMUNITY)
Admit: 2016-03-31 | Discharge: 2016-03-31 | Disposition: A | Discharge status: Home / Self Care | Payer: Medicare Other | Attending: Internal Medicine | Admitting: Internal Medicine

## 2016-03-31 ENCOUNTER — Inpatient Hospital Stay: Payer: Medicare Other

## 2016-03-31 DIAGNOSIS — R079 Chest pain, unspecified: Secondary | ICD-10-CM

## 2016-03-31 LAB — COMPREHENSIVE METABOLIC PANEL
ALK PHOS: 188 U/L — AB (ref 38–126)
ALT: 9 U/L — AB (ref 17–63)
AST: 13 U/L — AB (ref 15–41)
Albumin: 2.4 g/dL — ABNORMAL LOW (ref 3.5–5.0)
Anion gap: 6 (ref 5–15)
BUN: 33 mg/dL — AB (ref 6–20)
CALCIUM: 8.3 mg/dL — AB (ref 8.9–10.3)
CHLORIDE: 102 mmol/L (ref 101–111)
CO2: 33 mmol/L — ABNORMAL HIGH (ref 22–32)
CREATININE: 3.52 mg/dL — AB (ref 0.61–1.24)
GFR, EST AFRICAN AMERICAN: 18 mL/min — AB (ref >60)
GFR, EST NON AFRICAN AMERICAN: 16 mL/min — AB (ref >60)
Glucose, Bld: 118 mg/dL — ABNORMAL HIGH (ref 65–99)
Potassium: 3.5 mmol/L (ref 3.5–5.1)
Sodium: 141 mmol/L (ref 135–145)
Total Bilirubin: 0.9 mg/dL (ref 0.3–1.2)
Total Protein: 5.5 g/dL — ABNORMAL LOW (ref 6.5–8.1)

## 2016-03-31 LAB — TROPONIN I
TROPONIN I: 0.03 ng/mL — AB (ref <0.03)
Troponin I: <0.03 ng/mL (ref <0.03)

## 2016-03-31 LAB — CBC
HCT: 25.4 % — ABNORMAL LOW (ref 40.0–52.0)
Hemoglobin: 8.2 g/dL — ABNORMAL LOW (ref 13.0–18.0)
MCH: 28.2 pg (ref 26.0–34.0)
MCHC: 32.4 g/dL (ref 32.0–36.0)
MCV: 86.9 fL (ref 80.0–100.0)
PLATELETS: 157 10*3/uL (ref 150–440)
RBC: 2.92 MIL/uL — AB (ref 4.40–5.90)
RDW: 18 % — AB (ref 11.5–14.5)
WBC: 7 10*3/uL (ref 3.8–10.6)

## 2016-03-31 LAB — ECHOCARDIOGRAM COMPLETE
Height: 74 in
WEIGHTICAEL: 2895.96 [oz_av]

## 2016-03-31 LAB — GLUCOSE, CAPILLARY: Glucose-Capillary: 101 mg/dL — ABNORMAL HIGH (ref 65–99)

## 2016-03-31 LAB — HEPATITIS B SURFACE ANTIGEN: Hepatitis B Surface Ag: NEGATIVE

## 2016-03-31 MED ORDER — ALBUTEROL SULFATE (2.5 MG/3ML) 0.083% IN NEBU
2.5000 mg | INHALATION_SOLUTION | RESPIRATORY_TRACT | Status: DC | PRN
  Administered 2016-03-31 – 2016-04-03 (×5): 2.5 mg via RESPIRATORY_TRACT
  Filled 2016-03-31 (×4): qty 3

## 2016-03-31 MED ORDER — ALBUTEROL SULFATE (2.5 MG/3ML) 0.083% IN NEBU
INHALATION_SOLUTION | RESPIRATORY_TRACT | Status: AC
  Filled 2016-03-31: qty 3

## 2016-03-31 NOTE — Progress Notes (Signed)
PT Cancellation Note  Patient Details Name: [name] MRN: [id number] DOB: 11/01/1940   Cancelled Treatment:    Reason Eval/Treat Not Completed: Medical issues which prohibited therapy.  Elevated troponin and awaiting cardiology.  Will see tomorrow and ck pt ability to complete PT evaluation.   Ramond Dial 03/31/2016, 12:47 PM    Mee Hives, PT MS Acute Rehab Dept. Number: [place] [facility] [place]

## 2016-03-31 NOTE — Progress Notes (Signed)
[facility]  ROUNDING NOTE   Subjective:  Patient well known to [place] as an outpatient. He presented with shortness of breath during dialysis as an outpatient yesterday. He was subsequently brought in and we performed additional hemodialysis after presentation here. He reports that his shortness of breath has improved this a.m. 2-D echocardiogram just completed.   Objective:  Vital signs in last 24 hours:  Temp:  [97.8 F (36.6 C)-98.6 F (37 C)] 97.8 F (36.6 C) (07/09 0404) Pulse Rate:  [57-98] 72 (07/09 0404) Resp:  [16-37] 16 (07/09 0404) BP: (113-139)/(54-90) 115/65 mmHg (07/09 0404) SpO2:  [86 %-100 %] 97 % (07/09 0741) Weight:  [82.1 kg (181 lb)] 82.1 kg (181 lb) (07/08 1840)  Weight change:  Filed Weights   03/30/16 0925 03/30/16 1840  Weight: 81.647 kg (180 lb) 82.1 kg (181 lb)    Intake/Output: I/O last 3 completed shifts: In: 240 [P.O.:240] Out: 1057 [Other:1057]   Intake/Output this shift:  Total I/O In: -  Out: 1 [Urine:1]  Physical Exam: General: NAD, laying in bed  Head: Normocephalic, atraumatic. Moist oral mucosal membranes  Eyes: Anicteric  Neck: Supple, trachea midline  Lungs:  Basilar rales, otherwise clear, normal effort  Heart: S1S2 no rubs  Abdomen:  Soft, nontender, BS present   Extremities: no peripheral edema.  Neurologic: Nonfocal, moving all four extremities  Skin: No lesions  Access: LUE AVF    Basic Metabolic Panel:  Recent Labs Lab 03/30/16 0935 03/30/16 1800 03/31/16 0557  NA 139  --  141  K 3.6  --  3.5  CL 98*  --  102  CO2 32  --  33*  GLUCOSE 116*  --  118*  BUN 46*  --  33*  CREATININE 4.06*  --  3.52*  CALCIUM 8.4*  --  8.3*  PHOS  --  1.8*  --     Liver Function Tests:  Recent Labs Lab 03/30/16 0935 03/31/16 0557  AST 16 13*  ALT 12* 9*  ALKPHOS 234* 188*  BILITOT 1.2 0.9  PROT 6.0* 5.5*  ALBUMIN 2.7* 2.4*   No results for input(s): LIPASE, AMYLASE in the last 168 hours. No results  for input(s): AMMONIA in the last 168 hours.  CBC:  Recent Labs Lab 03/30/16 0935 03/31/16 0557  WBC 13.2* 7.0  NEUTROABS 11.6*  --   HGB 8.7* 8.2*  HCT 26.9* 25.4*  MCV 86.1 86.9  PLT 189 157    Cardiac Enzymes:  Recent Labs Lab 03/30/16 0935 03/30/16 1153 03/30/16 1800 03/30/16 2343 03/31/16 0557  TROPONINI 0.03* <0.03 0.04* <0.03 0.03*    BNP: Invalid input(s): POCBNP  CBG:  Recent Labs Lab 03/31/16 0727  GLUCAP 101*    Microbiology: Results for orders placed or performed during the hospital encounter of 03/12/16  MRSA PCR Screening     Status: None   Collection Time: 03/12/16  3:30 PM  Result Value Ref Range Status   MRSA by PCR NEGATIVE NEGATIVE Final    Comment:        The GeneXpert MRSA Assay (FDA approved for NASAL specimens only), is one component of a comprehensive MRSA colonization surveillance program. It is not intended to diagnose MRSA infection nor to guide or monitor treatment for MRSA infections.     Coagulation Studies: No results for input(s): LABPROT, INR in the last 72 hours.  Urinalysis: No results for input(s): COLORURINE, LABSPEC, PHURINE, GLUCOSEU, HGBUR, BILIRUBINUR, KETONESUR, PROTEINUR, UROBILINOGEN, NITRITE, LEUKOCYTESUR in the last 72 hours.  Invalid input(s): APPERANCEUR    Imaging: Dg Chest 2 View  03/31/2016  CLINICAL DATA:  75 year old male with severe shortness of Breath. Possible pneumonia. Former smoker. Initial encounter. EXAM: CHEST  2 VIEW COMPARISON:  03/30/2016 and earlier. FINDINGS: Seated AP and lateral views of the chest. Dense left lower lobe opacification, suspicious for combination of consolidation and left pleural effusion. Blunting of the right costophrenic angle is largely chronic. There is mild increased streaky opacity at the right lung base. No pneumothorax. No definite pulmonary edema. However, the cardiac silhouette does appear larger since March. Other mediastinal contours are stable.  Visualized tracheal air column is within normal limits. No acute osseous abnormality identified. IMPRESSION: 1. Confluent abnormal left lower lobe opacity, nonspecific but favor pneumonia with left pleural effusion. Followup PA and lateral chest X-ray is recommended in 3-4 weeks following trial of antibiotic therapy to ensure resolution and exclude underlying malignancy. 2. Cardiac silhouette appears increased since March. A Pericardial effusion is difficult to exclude. 3. Mildly increased streaky opacity at the right lung base, favor atelectasis. Electronically Signed   By: Genevie Ann M.D.   On: 03/31/2016 08:52   Dg Chest Portable 1 View  03/30/2016  CLINICAL DATA:  Shortness of breath.  End-stage renal disease EXAM: PORTABLE CHEST 1 VIEW COMPARISON:  March 12, 2016 FINDINGS: There is persistent airspace consolidation in the left lower lobe with small left effusion. There is a small right effusion with subsegmental atelectasis on the right. There is mild underlying interstitial edema, stable. There is cardiomegaly with mild pulmonary venous hypertension. There is atherosclerotic calcification in the aorta. The aorta is mildly prominent but stable. No adenopathy evident. No bone lesions. IMPRESSION: There is pulmonary vascular congestion with congestive heart failure. Suspect superimposed pneumonia left lower lobe, although the opacity in the left lower lobe could be due to alveolar edema. There is aortic atherosclerosis. Aortic prominence is stable and likely reflective of chronic hypertension. There is no new opacity. No change in cardiac silhouette. Electronically Signed   By: Lowella Grip III M.D.   On: 03/30/2016 09:57     Medications:     . albuterol      . amiodarone  200 mg Oral Daily  . aspirin EC  81 mg Oral Daily  . atorvastatin  40 mg Oral QHS  . carvedilol  3.125 mg Oral BID WC  . docusate sodium  100 mg Oral BID  . famotidine (PEPCID) IV  20 mg Intravenous Q12H  . ferrous sulfate  325  mg Oral Daily  . guaiFENesin  600 mg Oral BID  . heparin  5,000 Units Subcutaneous Q8H  . ipratropium-albuterol  3 mL Nebulization QID  . lamoTRIgine  25 mg Oral QHS  . [START ON 04/05/2016] lamoTRIgine  50 mg Oral QHS  . magnesium oxide  400 mg Oral QPM  . meropenem (MERREM) IV  500 mg Intravenous Q24H  . mometasone-formoterol  2 puff Inhalation BID  . nitroGLYCERIN  0.5 inch Topical Q8H  . sevelamer carbonate  800 mg Oral TID AC  . tiotropium  18 mcg Inhalation Daily   acetaminophen **OR** acetaminophen, albuterol, bisacodyl, LORazepam, morphine injection, ondansetron **OR** ondansetron (ZOFRAN) IV, oxyCODONE  Assessment/ Plan:  75 y.o. 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   1. ESRD on HD TTHS: Patient  completed hemodialysis yesterday. No urgent indication for additional dialysis treatment today.  2. Anemia of CKD:  - Hemoglobin currently 8.7. Continue Epogen with dialysis and also iron supplements.  3. SHPTH: Phosphorous actually low at 1.8 however patient received extensive dialysis treatment yesterday. Continue to monitor phosphorus for now. Continue Renvela 800 mg by mouth 3 times a day.  4. Hypertension:  Blood pressure 115/65.  Continue Coreg for now.  5. Shortness of breath. Appears to be multifactorial. It appears that pneumonia was favored on imaging. Continue meropenem for now. Await 2-D echocardiogram result as well.   LOS: 1 [name] 7/9/201711:47 AM

## 2016-03-31 NOTE — Progress Notes (Signed)
[place] [facility] [place] NAME: [name]    MR#:  [id number]  DATE OF BIRTH:  [birth year]  SUBJECTIVE:  CHIEF COMPLAINT:   Chief Complaint  Patient presents with  . Shortness of Breath   Cough, SOB and weakness. On O2 [place] 3L. REVIEW OF SYSTEMS:  CONSTITUTIONAL: No fever, has generalized weakness.  EYES: No blurred or double vision.  EARS, NOSE, AND THROAT: No tinnitus or ear pain.  RESPIRATORY: has cough, shortness of breath, no wheezing or hemoptysis.  CARDIOVASCULAR: No chest pain, orthopnea, edema.  GASTROINTESTINAL: No nausea, vomiting, diarrhea or abdominal pain.  GENITOURINARY: No dysuria, hematuria.  ENDOCRINE: No polyuria, nocturia,  HEMATOLOGY: No anemia, easy bruising or bleeding SKIN: No rash or lesion. MUSCULOSKELETAL: No joint pain or arthritis.   NEUROLOGIC: No tingling, numbness, weakness.  PSYCHIATRY: No anxiety or depression.   DRUG ALLERGIES:   Allergies  Allergen Reactions  . Penicillins Hives, Itching and Swelling    Has patient had a PCN reaction causing immediate rash, facial/tongue/throat swelling, SOB or lightheadedness with hypotension: Yes Has patient had a PCN reaction causing severe rash involving mucus membranes or skin necrosis: No Has patient had a PCN reaction that required hospitalization No Has patient had a PCN reaction occurring within the last 10 years: No If all of the above answers are "NO", then may proceed with Cephalosporin use.    VITALS:  Blood pressure 115/65, pulse 72, temperature 97.8 F (36.6 C), temperature source Oral, resp. rate 16, height 6\' 2"  (1.88 m), weight 181 lb (82.1 kg), SpO2 97 %.  PHYSICAL EXAMINATION:  GENERAL:  75 y.o.-year-old patient lying in the bed with no acute distress.  EYES: Pupils equal, round, reactive to light and accommodation. No scleral icterus. Extraocular muscles intact.  HEENT: Head atraumatic, normocephalic. Oropharynx and nasopharynx  clear.  NECK:  Supple, no jugular venous distention. No thyroid enlargement, no tenderness.  LUNGS: Normal breath sounds bilaterally, no wheezing, rales,rhonchi or crepitation. No use of accessory muscles of respiration.  CARDIOVASCULAR: S1, S2 normal. No murmurs, rubs, or gallops.  ABDOMEN: Soft, nontender, nondistended. Bowel sounds present. No organomegaly or mass.  EXTREMITIES: No pedal edema, cyanosis, or clubbing.  NEUROLOGIC: Cranial nerves II through XII are intact. Muscle strength 4/5 in all extremities. Sensation intact. Gait not checked.  PSYCHIATRIC: The patient is alert and oriented x 3.  SKIN: No obvious rash, lesion, or ulcer.    LABORATORY PANEL:   CBC  Recent Labs Lab 03/31/16 0557  WBC 7.0  HGB 8.2*  HCT 25.4*  PLT 157   ------------------------------------------------------------------------------------------------------------------  Chemistries   Recent Labs Lab 03/31/16 0557  NA 141  K 3.5  CL 102  CO2 33*  GLUCOSE 118*  BUN 33*  CREATININE 3.52*  CALCIUM 8.3*  AST 13*  ALT 9*  ALKPHOS 188*  BILITOT 0.9   ------------------------------------------------------------------------------------------------------------------  Cardiac Enzymes  Recent Labs Lab 03/31/16 0557  TROPONINI 0.03*   ------------------------------------------------------------------------------------------------------------------  RADIOLOGY:  Dg Chest 2 View  03/31/2016  CLINICAL DATA:  75 year old male with severe shortness of Breath. Possible pneumonia. Former smoker. Initial encounter. EXAM: CHEST  2 VIEW COMPARISON:  03/30/2016 and earlier. FINDINGS: Seated AP and lateral views of the chest. Dense left lower lobe opacification, suspicious for combination of consolidation and left pleural effusion. Blunting of the right costophrenic angle is largely chronic. There is mild increased streaky opacity at the right lung base. No pneumothorax. No definite pulmonary edema.  However, the cardiac silhouette does  appear larger since March. Other mediastinal contours are stable. Visualized tracheal air column is within normal limits. No acute osseous abnormality identified. IMPRESSION: 1. Confluent abnormal left lower lobe opacity, nonspecific but favor pneumonia with left pleural effusion. Followup PA and lateral chest X-ray is recommended in 3-4 weeks following trial of antibiotic therapy to ensure resolution and exclude underlying malignancy. 2. Cardiac silhouette appears increased since March. A Pericardial effusion is difficult to exclude. 3. Mildly increased streaky opacity at the right lung base, favor atelectasis. Electronically Signed   By: Genevie Ann M.D.   On: 03/31/2016 08:52   Dg Chest Portable 1 View  03/30/2016  CLINICAL DATA:  Shortness of breath.  End-stage renal disease EXAM: PORTABLE CHEST 1 VIEW COMPARISON:  March 12, 2016 FINDINGS: There is persistent airspace consolidation in the left lower lobe with small left effusion. There is a small right effusion with subsegmental atelectasis on the right. There is mild underlying interstitial edema, stable. There is cardiomegaly with mild pulmonary venous hypertension. There is atherosclerotic calcification in the aorta. The aorta is mildly prominent but stable. No adenopathy evident. No bone lesions. IMPRESSION: There is pulmonary vascular congestion with congestive heart failure. Suspect superimposed pneumonia left lower lobe, although the opacity in the left lower lobe could be due to alveolar edema. There is aortic atherosclerosis. Aortic prominence is stable and likely reflective of chronic hypertension. There is no new opacity. No change in cardiac silhouette. Electronically Signed   By: Lowella Grip III M.D.   On: 03/30/2016 09:57    EKG:   Orders placed or performed during the hospital encounter of 03/30/16  . ED EKG  . ED EKG  . EKG 12-Lead  . EKG 12-Lead    ASSESSMENT AND PLAN:    Acute on chronic  respiratory distress. Continue [place] 3L. NEB prn.  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 Continue HD. F/u echo. Continue coreg.  HCAP (healthcare-associated pneumonia, LLL) Continue meropenem, f/u CBC, blood culture.  ESRD on hemodialysis ([place]), continue HD.  COPD. Continue NEB.  Dementia. Fall and aspiration precaution.  Weakness. PT.   All the records are reviewed and case discussed with Care Management/Social Workerr. Management plans discussed with the patient, family and they are in agreement.  CODE STATUS: full code.  TOTAL TIME TAKING CARE OF THIS PATIENT: 38 minutes.  Greater than 50% time was spent on coordination of care and face-to-face counseling.  POSSIBLE D/C IN 2-3 DAYS, DEPENDING ON CLINICAL CONDITION.   Demetrios Loll M.D on 03/31/2016 at 10:16 AM  Between 7am to 6pm - Pager - [phone]  After 6pm go to [email or web address] - password [email or web address] [facility]  Office  [phone]  CC: Primary care physician; Dion Body, MD

## 2016-04-01 MED ORDER — FAMOTIDINE 20 MG PO TABS
20.0000 mg | ORAL_TABLET | ORAL | Status: DC
Start: 2016-04-02 — End: 2016-04-04
  Administered 2016-04-02 – 2016-04-04 (×2): 20 mg via ORAL
  Filled 2016-04-01 (×2): qty 1

## 2016-04-01 NOTE — Clinical Social Work Note (Signed)
Clinical Social Work Assessment  Patient Details  Name: [name] MRN: [id number] Date of Birth: 07/06/1941  Date of referral:  04/01/16               Reason for consult:  Facility Placement                Permission sought to share information with:    Permission granted to share information::     Name::        Agency::     Relationship::     Contact Information:     Housing/Transportation Living arrangements for the past 2 months:  [place] of Information:  Other (Comment Required) (patient's cousin) Patient Interpreter Needed:  None Criminal Activity/Legal Involvement Pertinent to Current Situation/Hospitalization:  No - Comment as needed Significant Relationships:  Other Family Members (cousin) Lives with:  Facility Resident Do you feel safe going back to the place where you live?  Yes Need for family participation in patient care:  Yes (Comment)  Care giving concerns:  Patient is long term resident at [facility].   Social Worker assessment / plan:  CSW informed by MD that patient will return to [facility]. Patient is known to CSW from previous admissions and has been a long term resident at [facility]. Patient's contact is his cousin: [name]: [phone]. CSW spoke with Ms. Cousin and she stated that she does wish for patient to return to [facility] at discharge. CSW made her aware that patient may discharge tomorrow and she was in agreement with this.   Employment status:  Retired Nurse, adult PT Recommendations:  [place] / Referral to community resources:     Patient/Family's Response to care:  Patient's cousin very grateful for CSW contacting her this morning.  Patient/Family's Understanding of and Emotional Response to Diagnosis, Current Treatment, and Prognosis:  Patient's cousin is in agreement with discharge tomorrow.  Emotional  Assessment Appearance:  Appears stated age Attitude/Demeanor/Rapport:  Unable to Assess (patient sleeping) Affect (typically observed):  Unable to Assess (patient sleeping) Orientation:  Fluctuating Orientation (Suspected and/or reported Sundowners) Alcohol / Substance use:  Not Applicable Psych involvement (Current and /or in the community):  No (Comment)  Discharge Needs  Concerns to be addressed:  Denies Needs/Concerns at this time Readmission within the last 30 days:  No Current discharge risk:  None Barriers to Discharge:  No Barriers Identified   [name], LCSW 04/01/2016, 11:07 AM

## 2016-04-01 NOTE — Progress Notes (Signed)
[facility]  ROUNDING NOTE   Subjective:   Working with Physical Therapy.   Objective:  Vital signs in last 24 hours:  Temp:  [97.8 F (36.6 C)-98.7 F (37.1 C)] 98.7 F (37.1 C) (07/10 0458) Pulse Rate:  [72-77] 72 (07/10 0458) Resp:  [18-22] 22 (07/10 0458) BP: (107-121)/(62-70) 121/70 mmHg (07/10 0458) SpO2:  [95 %-100 %] 99 % (07/10 1137) Weight:  [82.056 kg (180 lb 14.4 oz)] 82.056 kg (180 lb 14.4 oz) (07/10 0500)  Weight change: 0.408 kg (14.4 oz) Filed Weights   03/30/16 0925 03/30/16 1840 04/01/16 0500  Weight: 81.647 kg (180 lb) 82.1 kg (181 lb) 82.056 kg (180 lb 14.4 oz)    Intake/Output: I/O last 3 completed shifts: In: 26 [P.O.:720] Out: 1 [Urine:1]   Intake/Output this shift:  Total I/O In: 300 [P.O.:300] Out: 0   Physical Exam: General: NAD  Head: Normocephalic, atraumatic. Moist oral mucosal membranes  Eyes: Anicteric  Neck: Supple, trachea midline  Lungs:  Basilar rales, otherwise clear, normal effort  Heart: S1S2 no rubs  Abdomen:  Soft, nontender, BS present   Extremities: no peripheral edema.  Neurologic: Nonfocal, moving all four extremities  Skin: No lesions  Access: LUE AVF    Basic Metabolic Panel:  Recent Labs Lab 03/30/16 0935 03/30/16 1800 03/31/16 0557  NA 139  --  141  K 3.6  --  3.5  CL 98*  --  102  CO2 32  --  33*  GLUCOSE 116*  --  118*  BUN 46*  --  33*  CREATININE 4.06*  --  3.52*  CALCIUM 8.4*  --  8.3*  PHOS  --  1.8*  --     Liver Function Tests:  Recent Labs Lab 03/30/16 0935 03/31/16 0557  AST 16 13*  ALT 12* 9*  ALKPHOS 234* 188*  BILITOT 1.2 0.9  PROT 6.0* 5.5*  ALBUMIN 2.7* 2.4*   No results for input(s): LIPASE, AMYLASE in the last 168 hours. No results for input(s): AMMONIA in the last 168 hours.  CBC:  Recent Labs Lab 03/30/16 0935 03/31/16 0557  WBC 13.2* 7.0  NEUTROABS 11.6*  --   HGB 8.7* 8.2*  HCT 26.9* 25.4*  MCV 86.1 86.9  PLT 189 157    Cardiac  Enzymes:  Recent Labs Lab 03/30/16 0935 03/30/16 1153 03/30/16 1800 03/30/16 2343 03/31/16 0557  TROPONINI 0.03* <0.03 0.04* <0.03 0.03*    BNP: Invalid input(s): POCBNP  CBG:  Recent Labs Lab 03/31/16 0727  GLUCAP 101*    Microbiology: Results for orders placed or performed during the hospital encounter of 03/12/16  MRSA PCR Screening     Status: None   Collection Time: 03/12/16  3:30 PM  Result Value Ref Range Status   MRSA by PCR NEGATIVE NEGATIVE Final    Comment:        The GeneXpert MRSA Assay (FDA approved for NASAL specimens only), is one component of a comprehensive MRSA colonization surveillance program. It is not intended to diagnose MRSA infection nor to guide or monitor treatment for MRSA infections.     Coagulation Studies: No results for input(s): LABPROT, INR in the last 72 hours.  Urinalysis: No results for input(s): COLORURINE, LABSPEC, PHURINE, GLUCOSEU, HGBUR, BILIRUBINUR, KETONESUR, PROTEINUR, UROBILINOGEN, NITRITE, LEUKOCYTESUR in the last 72 hours.  Invalid input(s): APPERANCEUR    Imaging: Dg Chest 2 View  03/31/2016  CLINICAL DATA:  75 year old male with severe shortness of Breath. Possible pneumonia. Former smoker. Initial encounter. EXAM:  CHEST  2 VIEW COMPARISON:  03/30/2016 and earlier. FINDINGS: Seated AP and lateral views of the chest. Dense left lower lobe opacification, suspicious for combination of consolidation and left pleural effusion. Blunting of the right costophrenic angle is largely chronic. There is mild increased streaky opacity at the right lung base. No pneumothorax. No definite pulmonary edema. However, the cardiac silhouette does appear larger since March. Other mediastinal contours are stable. Visualized tracheal air column is within normal limits. No acute osseous abnormality identified. IMPRESSION: 1. Confluent abnormal left lower lobe opacity, nonspecific but favor pneumonia with left pleural effusion. Followup PA  and lateral chest X-ray is recommended in 3-4 weeks following trial of antibiotic therapy to ensure resolution and exclude underlying malignancy. 2. Cardiac silhouette appears increased since March. A Pericardial effusion is difficult to exclude. 3. Mildly increased streaky opacity at the right lung base, favor atelectasis. Electronically Signed   By: Genevie Ann M.D.   On: 03/31/2016 08:52     Medications:     . amiodarone  200 mg Oral Daily  . aspirin EC  81 mg Oral Daily  . atorvastatin  40 mg Oral QHS  . carvedilol  3.125 mg Oral BID WC  . docusate sodium  100 mg Oral BID  . [START ON 04/02/2016] famotidine  20 mg Oral Q48H  . ferrous sulfate  325 mg Oral Daily  . guaiFENesin  600 mg Oral BID  . heparin  5,000 Units Subcutaneous Q8H  . ipratropium-albuterol  3 mL Nebulization QID  . lamoTRIgine  25 mg Oral QHS  . [START ON 04/05/2016] lamoTRIgine  50 mg Oral QHS  . magnesium oxide  400 mg Oral QPM  . meropenem (MERREM) IV  500 mg Intravenous Q24H  . mometasone-formoterol  2 puff Inhalation BID  . nitroGLYCERIN  0.5 inch Topical Q8H  . sevelamer carbonate  800 mg Oral TID AC  . tiotropium  18 mcg Inhalation Daily   acetaminophen **OR** acetaminophen, albuterol, bisacodyl, LORazepam, morphine injection, ondansetron **OR** ondansetron (ZOFRAN) IV, oxyCODONE  Assessment/ Plan:  75 y.o. 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   1. ESRD on HD TTHS: Treatment for tomorrow.   2. Anemia of CKD:  - Continue Epogen with dialysis and also iron supplements.  3. Secondary Hyperparathyroidism:  - Continue Renvela 800 mg by mouth 3 times a day.  4. Hypertension:  Well controlled - carvedilol.    LOS: [place], Jenniferann Stuckert 7/10/201711:51 AM

## 2016-04-01 NOTE — Progress Notes (Signed)
[place] [facility] [employer] NAME: [name]    MR#:  [id number]  DATE OF BIRTH:  [birth year]  SUBJECTIVE:  CHIEF COMPLAINT:   Chief Complaint  Patient presents with  . Shortness of Breath   Better Cough, SOB and weakness. On O2 [place] 3L. REVIEW OF SYSTEMS:  CONSTITUTIONAL: No fever, has generalized weakness.  EYES: No blurred or double vision.  EARS, NOSE, AND THROAT: No tinnitus or ear pain.  RESPIRATORY: has cough, shortness of breath, no wheezing or hemoptysis.  CARDIOVASCULAR: No chest pain, orthopnea, edema.  GASTROINTESTINAL: No nausea, vomiting, diarrhea or abdominal pain.  GENITOURINARY: No dysuria, hematuria.  ENDOCRINE: No polyuria, nocturia,  HEMATOLOGY: No anemia, easy bruising or bleeding SKIN: No rash or lesion. MUSCULOSKELETAL: No joint pain or arthritis.   NEUROLOGIC: No tingling, numbness, weakness.  PSYCHIATRY: No anxiety or depression.   DRUG ALLERGIES:   Allergies  Allergen Reactions  . Penicillins Hives, Itching and Swelling    Has patient had a PCN reaction causing immediate rash, facial/tongue/throat swelling, SOB or lightheadedness with hypotension: Yes Has patient had a PCN reaction causing severe rash involving mucus membranes or skin necrosis: No Has patient had a PCN reaction that required hospitalization No Has patient had a PCN reaction occurring within the last 10 years: No If all of the above answers are "NO", then may proceed with Cephalosporin use.    VITALS:  Blood pressure 121/70, pulse 72, temperature 98.7 F (37.1 C), temperature source Oral, resp. rate 22, height 6\' 2"  (1.88 m), weight 180 lb 14.4 oz (82.056 kg), SpO2 99 %.  PHYSICAL EXAMINATION:  GENERAL:  75 y.o.-year-old patient lying in the bed with no acute distress.  EYES: Pupils equal, round, reactive to light and accommodation. No scleral icterus. Extraocular muscles intact.  HEENT: Head atraumatic, normocephalic. Oropharynx  and nasopharynx clear.  NECK:  Supple, no jugular venous distention. No thyroid enlargement, no tenderness.  LUNGS: Normal breath sounds bilaterally, no wheezing, rales,rhonchi or crepitation. No use of accessory muscles of respiration.  CARDIOVASCULAR: S1, S2 normal. No murmurs, rubs, or gallops.  ABDOMEN: Soft, nontender, nondistended. Bowel sounds present. No organomegaly or mass.  EXTREMITIES: No pedal edema, cyanosis, or clubbing.  NEUROLOGIC: Cranial nerves II through XII are intact. Muscle strength 4/5 in all extremities. Sensation intact. Gait not checked.  PSYCHIATRIC: The patient is alert and oriented x 3.  SKIN: No obvious rash, lesion, or ulcer.    LABORATORY PANEL:   CBC  Recent Labs Lab 03/31/16 0557  WBC 7.0  HGB 8.2*  HCT 25.4*  PLT 157   ------------------------------------------------------------------------------------------------------------------  Chemistries   Recent Labs Lab 03/31/16 0557  NA 141  K 3.5  CL 102  CO2 33*  GLUCOSE 118*  BUN 33*  CREATININE 3.52*  CALCIUM 8.3*  AST 13*  ALT 9*  ALKPHOS 188*  BILITOT 0.9   ------------------------------------------------------------------------------------------------------------------  Cardiac Enzymes  Recent Labs Lab 03/31/16 0557  TROPONINI 0.03*   ------------------------------------------------------------------------------------------------------------------  RADIOLOGY:  Dg Chest 2 View  03/31/2016  CLINICAL DATA:  75 year old male with severe shortness of Breath. Possible pneumonia. Former smoker. Initial encounter. EXAM: CHEST  2 VIEW COMPARISON:  03/30/2016 and earlier. FINDINGS: Seated AP and lateral views of the chest. Dense left lower lobe opacification, suspicious for combination of consolidation and left pleural effusion. Blunting of the right costophrenic angle is largely chronic. There is mild increased streaky opacity at the right lung base. No pneumothorax. No definite  pulmonary edema. However, the  cardiac silhouette does appear larger since March. Other mediastinal contours are stable. Visualized tracheal air column is within normal limits. No acute osseous abnormality identified. IMPRESSION: 1. Confluent abnormal left lower lobe opacity, nonspecific but favor pneumonia with left pleural effusion. Followup PA and lateral chest X-ray is recommended in 3-4 weeks following trial of antibiotic therapy to ensure resolution and exclude underlying malignancy. 2. Cardiac silhouette appears increased since March. A Pericardial effusion is difficult to exclude. 3. Mildly increased streaky opacity at the right lung base, favor atelectasis. Electronically Signed   By: Genevie Ann M.D.   On: 03/31/2016 08:52    EKG:   Orders placed or performed during the hospital encounter of 03/30/16  . ED EKG  . ED EKG  . EKG 12-Lead  . EKG 12-Lead    ASSESSMENT AND PLAN:    Acute on chronic respiratory failure. Try to wean O2 [place] down to 2 L. NEB prn.  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 Continue HD. echo yesterday: EF: 35% - 40%. Continue coreg.  HCAP (healthcare-associated pneumonia, LLL) Continue meropenem, leukocytosis improved. ESRD on hemodialysis ([place]), continue HD.  COPD. Continue NEB.  Dementia. Fall and aspiration precaution.  Weakness. PT.   All the records are reviewed and case discussed with Care Management/Social Workerr. Management plans discussed with the patient, family and they are in agreement.  CODE STATUS: full code.  TOTAL TIME TAKING CARE OF THIS PATIENT: 8minutes.  Greater than 50% time was spent on coordination of care and face-to-face counseling.  POSSIBLE D/C IN 1-2 DAYS, DEPENDING ON CLINICAL CONDITION.   Demetrios Loll M.D on 04/01/2016 at 12:14 PM  Between 7am to 6pm - Pager - [phone]  After 6pm go to [email or web address] - password [email or web address] [facility]  Office   [phone]  CC: Primary care physician; Dion Body, MD

## 2016-04-01 NOTE — Progress Notes (Signed)
Key Points: Use following P&T approved IV to PO antibiotic change policy.  Description contains the criteria that are approved Note: Policy Excludes:  Esophagectomy patientsPHARMACIST - PHYSICIAN COMMUNICATION DR:   Bridgett Larsson CONCERNING: IV to Oral Route Change Policy  RECOMMENDATION: This patient is receiving famotidine by the intravenous route.  Based on criteria approved by the Pharmacy and Therapeutics Committee, the intravenous medication(s) is/are being converted to the equivalent oral dose form(s).   DESCRIPTION: These criteria include:  The patient is eating (either orally or via tube) and/or has been taking other orally administered medications for a least 24 hours  The patient has no evidence of active gastrointestinal bleeding or impaired GI absorption (gastrectomy, short bowel, patient on TNA or NPO).  If you have questions about this conversion, please contact the Pharmacy Department  []   [phone] )  [facility] [x]   [phone] )  [facility] []   [phone] )  [facility] []   [phone] )  [facility] []   [phone] )  [place], [facility] 04/01/2016 9:41 AM

## 2016-04-01 NOTE — Care Management (Signed)
RNCM consult placed due to patient being on HD.  Patient is from [place] bed.  CSW following.  RNCM signing off.  Available if needed.

## 2016-04-01 NOTE — Evaluation (Signed)
Physical Therapy Evaluation Patient Details Name: [name] MRN: [id number] DOB: 1941-09-23 Today's Date: 04/01/2016   History of Present Illness  Pt is a 75 yr old male presenting with worsening SOB. Admitted with acute on chronic CHF and LLL pneumonia. PMH significant for ESRD (HD T,Th,Sat), CHF, Afib, anemia, COPD, HTN, CA (bladder, prostate), and h/o AAA with endoleak, CVA, SBO, and septic shock.   Clinical Impression  Prior to admission, pt was long term care at [facility]. Pt was non-ambulatory, requiring total assist with lift equipment bed <> manual w/c. Pt able to propel self household distances in manual w/c.Currently, pt is max A x2 for sit <> stand transfers, reaching only ~75% of full stand. Pt is limited by generalized weakness and deconditioning, but is extremely motivated to increase independence with functional mobility. Pt would benefit from skilled PT for strengthening and functional transfer training in order to increase independence and simultaneously decrease caregiver burden.  Recommend pt discharge home with HHPT when medically appropriate.     Follow Up Recommendations Home health PT    Equipment Recommendations  Other (comment) (has equipment at home)    Recommendations for Other Services       Precautions / Restrictions Precautions Precautions: Fall Restrictions Weight Bearing Restrictions: No      Mobility  Bed Mobility General bed mobility comments: Received in chair, returned to chair; not assessed   Transfers Overall transfer level: Needs assistance Equipment used: Rolling walker (2 wheeled) Transfers: Sit to/from Stand (x3 trials) Sit to Stand: Max assist;+2 physical assistance General transfer comment: Trialed 3 attempts of sit <> stand with max A x2, but only achieving ~75% full stand with each. Limited by generalized weakness and deconditioning. Vc's for hand/foot placement and technique.   Ambulation/Gait General Gait Details:  Unsafe to assess  Stairs     Wheelchair Mobility    Modified Rankin (Stroke Patients Only)     Balance Overall balance assessment: Needs assistance Sitting-balance support: Feet supported;Bilateral upper extremity supported Sitting balance-Leahy Scale: Fair Sitting balance - Comments: Seated in recliner, pt is able to participate in MMT and TherEx without LOB. No formal assessment. Standing balance comment: Unsafe to assess      Pertinent Vitals/Pain Pain Assessment: No/denies pain  HR 77-82 bpm throughout session. SaO2 at rest: 99%, immediately following standing trial 94%, promptly recovers to 99-100%; all on 3L O2    Home Living Family/patient expects to be discharged to:: Assisted living (Long term care @ [facility]) Home Equipment: Environmental consultant - 2 wheels;Wheelchair - manual Additional Comments: Per patient he has assistance 24/7     Prior Function Level of Independence: Needs assistance  Comments: Pt is on 2L O2 chronically at baseline. Pt has been non ambulatory since his arrival at [facility] 3 months ago. Per pt, he is a total assist using lift equipment for transfers bed <> manual W/C. He is able to self propel in w/c household distances. Of note, pt was able to take 5 steps (with RW and assist x2) nearly 4 months ago while he was a resident at [facility].      Extremity/Trunk Assessment   Upper Extremity Assessment: Generalized weakness  Lower Extremity Assessment:  Hip Flexion: R 3/5        L 3+/5 Knee Flexion: R 4-/5       L 4-/5 Knee Extension: R 3+/5       L 4-/5 Ankle Plantarflexion: R 5/5       L 5/5  Ankle Dorsiflexion: R 5/5       L 5/5  Distal lower extremities with visible atrophy bilaterally Slight knee flexion contracture noted bilaterally, PT encouraged pt to avoid knee flexion in supine   Cervical / Trunk Assessment: Normal    Communication   Communication: No difficulties (very soft speech)  Cognition Arousal/Alertness:  Awake/alert Behavior During Therapy: WFL for tasks assessed/performed Overall Cognitive Status: Within Functional Limits for tasks assessed    General Comments Nursing cleared pt for participation in physical therapy.  Pt agreeable to PT session.     Exercises General Exercises - Lower Extremity Ankle Circles/Pumps: AROM Long Arc Quad: AROM Heel Slides: AROM Hip ABduction/ADduction: AROM Hip Flexion/Marching: AROM  All exercises performed bilaterally x 10 reps in sitting/long sitting.       Assessment/Plan    PT Assessment Patient needs continued PT services  PT Diagnosis Generalized weakness;Difficulty walking   PT Problem List Decreased strength;Decreased range of motion;Decreased activity tolerance;Decreased mobility;Decreased knowledge of use of DME  PT Treatment Interventions DME instruction;Gait training;Functional mobility training;Therapeutic activities;Therapeutic exercise;Balance training;Patient/family education   PT Goals (Current goals can be found in the Care Plan section) Acute Rehab PT Goals Patient Stated Goal: To walk again PT Goal Formulation: With patient Time For Goal Achievement: 04/15/16 Potential to Achieve Goals: Good    Frequency Min 2X/week   Barriers to discharge Assist levels       End of Session Equipment Utilized During Treatment: Gait belt;Oxygen (3L O2) Activity Tolerance: Patient limited by fatigue Patient left: in chair;with call bell/phone within reach;with chair alarm set Nurse Communication: Mobility status;Precautions         Time: [id number] PT Time Calculation (min) (ACUTE ONLY): 38 min   Charges:         PT G Codes:        Byren Pankow, SPT 04/01/2016, 1:31 PM

## 2016-04-01 NOTE — NC FL2 (Signed)
[place] LEVEL OF CARE SCREENING TOOL     IDENTIFICATION  Patient Name: [name] Birthdate: [birth year] Sex: male Admission Date (Current Location): 03/30/2016  [place] and [place] Number:  Engineering geologist and Address:  [facility], [address] [place], [place] [address]      Provider Number: [id number]  Attending Physician Name and Address:  Demetrios Loll, MD  Relative Name and Phone Number:       Current Level of Care: Hospital Recommended Level of Care: [place] Prior Approval Number:    Date Approved/Denied:   PASRR Number: [id number]  Discharge Plan: SNF    Current Diagnoses: Patient Active Problem List   Diagnosis Date Noted  . Acute respiratory distress (HCC) 03/30/2016  . Acute on chronic diastolic (congestive) heart failure ([place]) 03/30/2016  . HCAP (healthcare-associated pneumonia) 03/30/2016  . History of prostate cancer 02/12/2016  . SOB (shortness of breath)   . COPD (chronic obstructive pulmonary disease) ([place])   . 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 ESRD on dialysis ([place])   . 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 COPD (chronic obstructive pulmonary disease) with chronic bronchitis ([place]) 11/06/2015  . Chronic diastolic heart failure ([place]) 08/11/2015  . Generalized weakness 05/08/2015  . Other emphysema ([place]) 05/08/2015  . Tobacco use disorder 05/08/2015  . Hypokalemia 05/08/2015  . Pressure ulcer 05/05/2015  . Symptomatic anemia 05/04/2015  . CAFL (chronic airflow limitation) ([place]) 04/10/2015  . Malnutrition of moderate degree ([place]) 03/22/2015  . Pneumonia 03/20/2015  . History of sepsis 02/27/2015  . Incontinence 02/27/2015  . Persistent atrial fibrillation ([place]) 02/27/2015  . Preoperative cardiovascular examination 02/27/2015  . ESRD on hemodialysis ([place])   . Anemia   . History of small bowel obstruction   . Chronic kidney disease (CKD), stage IV (severe) ([place])  03/01/2014  . Essential (primary) hypertension 03/01/2014  . 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 Cystitis 11/11/2012  . Hydronephrosis 08/27/2012  . Bladder cancer ([place]) 08/27/2012  . CA of prostate ([place]) 08/27/2012  . Bone metastases ([place]) 08/27/2012    Orientation RESPIRATION BLADDER Height & Weight     Self, Time, Place  Normal, O2 (3 liters) Continent Weight: 180 lb 14.4 oz (82.056 kg) Height:  6\' 2"  (188 cm)  BEHAVIORAL SYMPTOMS/MOOD NEUROLOGICAL BOWEL NUTRITION STATUS   (none)  (none) Incontinent Diet (2 gm na)  AMBULATORY STATUS COMMUNICATION OF NEEDS Skin   Extensive Assist Verbally Normal                       Personal Care Assistance Level of Assistance  Bathing, Dressing Bathing Assistance: Maximum assistance Feeding assistance: Maximum assistance Dressing Assistance: Maximum assistance     Functional Limitations Info             SPECIAL CARE FACTORS FREQUENCY   (hemodialysis)                    Contractures Contractures Info: Not present    Additional Factors Info                  Current Medications (04/01/2016):  This is the current hospital active medication list Current Facility-Administered Medications  Medication Dose Route Frequency Provider Last Rate Last Dose  . acetaminophen (TYLENOL) tablet 650 mg  650 mg Oral Q6H PRN Idelle Crouch, MD       Or  . acetaminophen (TYLENOL) suppository 650 mg  650 mg Rectal Q6H  PRN Idelle Crouch, MD      . albuterol (PROVENTIL) (2.5 MG/3ML) 0.083% nebulizer solution 2.5 mg  2.5 mg Nebulization Q2H PRN Saundra Shelling, MD   2.5 mg at 04/01/16 0527  . amiodarone (PACERONE) tablet 200 mg  200 mg Oral Daily Idelle Crouch, MD   200 mg at 04/01/16 1018  . aspirin EC tablet 81 mg  81 mg Oral Daily Theodoro Grist, MD   81 mg at 04/01/16 1018  . atorvastatin (LIPITOR) tablet 40 mg  40 mg Oral QHS Idelle Crouch, MD   40 mg at 03/31/16 2109  . bisacodyl (DULCOLAX) suppository 10 mg  10 mg Rectal  Daily PRN Idelle Crouch, MD      . carvedilol (COREG) tablet 3.125 mg  3.125 mg Oral BID WC Idelle Crouch, MD   3.125 mg at 04/01/16 0825  . docusate sodium (COLACE) capsule 100 mg  100 mg Oral BID Idelle Crouch, MD   100 mg at 04/01/16 1018  . [START ON 04/02/2016] famotidine (PEPCID) tablet 20 mg  20 mg Oral Q48H Demetrios Loll, MD      . ferrous sulfate tablet 325 mg  325 mg Oral Daily Idelle Crouch, MD   325 mg at 04/01/16 1018  . guaiFENesin (MUCINEX) 12 hr tablet 600 mg  600 mg Oral BID Idelle Crouch, MD   600 mg at 04/01/16 1018  . heparin injection 5,000 Units  5,000 Units Subcutaneous Q8H Idelle Crouch, MD   5,000 Units at 04/01/16 0526  . ipratropium-albuterol (DUONEB) 0.5-2.5 (3) MG/3ML nebulizer solution 3 mL  3 mL Nebulization QID Idelle Crouch, MD   3 mL at 04/01/16 0801  . lamoTRIgine (LAMICTAL) tablet 25 mg  25 mg Oral QHS Idelle Crouch, MD   25 mg at 03/31/16 2109  . [START ON 04/05/2016] lamoTRIgine (LAMICTAL) tablet 50 mg  50 mg Oral QHS Idelle Crouch, MD      . LORazepam (ATIVAN) tablet 0.5 mg  0.5 mg Oral Q4H PRN Idelle Crouch, MD   0.5 mg at 04/01/16 [id number]  . magnesium oxide (MAG-OX) tablet 400 mg  400 mg Oral QPM Idelle Crouch, MD   400 mg at 03/31/16 1749  . meropenem (MERREM) 500 mg in sodium chloride 0.9 % 50 mL IVPB  500 mg Intravenous Q24H Idelle Crouch, MD   500 mg at 03/31/16 2111  . mometasone-formoterol (DULERA) 200-5 MCG/ACT inhaler 2 puff  2 puff Inhalation BID Idelle Crouch, MD   2 puff at 04/01/16 0825  . morphine 2 MG/ML injection 2 mg  2 mg Intravenous Q4H PRN Idelle Crouch, MD   2 mg at 03/31/16 0606  . nitroGLYCERIN (NITROGLYN) 2 % ointment 0.5 inch  0.5 inch Topical Q8H Idelle Crouch, MD   0.5 inch at 04/01/16 0526  . ondansetron (ZOFRAN) tablet 4 mg  4 mg Oral Q6H PRN Idelle Crouch, MD       Or  . ondansetron [facility]) injection 4 mg  4 mg Intravenous Q6H PRN Idelle Crouch, MD      . oxyCODONE (Oxy IR/ROXICODONE)  immediate release tablet 5 mg  5 mg Oral Q6H PRN Idelle Crouch, MD      . sevelamer carbonate (RENVELA) tablet 800 mg  800 mg Oral TID AC Idelle Crouch, MD   800 mg at 04/01/16 0825  . tiotropium (SPIRIVA) inhalation capsule 18 mcg  18 mcg Inhalation Daily  Idelle Crouch, MD   18 mcg at 04/01/16 1018     Discharge Medications: Please see discharge summary for a list of discharge medications.  Relevant Imaging Results:  Relevant Lab Results:   Additional Information    [name], LCSW

## 2016-04-02 ENCOUNTER — Inpatient Hospital Stay: Payer: Medicare Other

## 2016-04-02 LAB — RENAL FUNCTION PANEL
Albumin: 2.4 g/dL — ABNORMAL LOW (ref 3.5–5.0)
Anion gap: 12 (ref 5–15)
BUN: 72 mg/dL — ABNORMAL HIGH (ref 6–20)
CO2: 28 mmol/L (ref 22–32)
Calcium: 8.3 mg/dL — ABNORMAL LOW (ref 8.9–10.3)
Chloride: 98 mmol/L — ABNORMAL LOW (ref 101–111)
Creatinine, Ser: 6.26 mg/dL — ABNORMAL HIGH (ref 0.61–1.24)
GFR calc Af Amer: 9 mL/min — ABNORMAL LOW
GFR calc non Af Amer: 8 mL/min — ABNORMAL LOW
Glucose, Bld: 123 mg/dL — ABNORMAL HIGH (ref 65–99)
Phosphorus: 4.7 mg/dL — ABNORMAL HIGH (ref 2.5–4.6)
Potassium: 5.1 mmol/L (ref 3.5–5.1)
Sodium: 138 mmol/L (ref 135–145)

## 2016-04-02 LAB — CBC
HEMATOCRIT: 23.8 % — AB (ref 40.0–52.0)
HEMOGLOBIN: 7.7 g/dL — AB (ref 13.0–18.0)
MCH: 27.5 pg (ref 26.0–34.0)
MCHC: 32.2 g/dL (ref 32.0–36.0)
MCV: 85.5 fL (ref 80.0–100.0)
Platelets: 223 10*3/uL (ref 150–440)
RBC: 2.78 MIL/uL — ABNORMAL LOW (ref 4.40–5.90)
RDW: 18.2 % — AB (ref 11.5–14.5)
WBC: 7.9 10*3/uL (ref 3.8–10.6)

## 2016-04-02 LAB — GLUCOSE, CAPILLARY: Glucose-Capillary: 116 mg/dL — ABNORMAL HIGH (ref 65–99)

## 2016-04-02 LAB — PARATHYROID HORMONE, INTACT (NO CA): PTH: 151 pg/mL — ABNORMAL HIGH (ref 15–65)

## 2016-04-02 MED ORDER — LEVOFLOXACIN IN D5W 750 MG/150ML IV SOLN
750.0000 mg | Freq: Once | INTRAVENOUS | Status: AC
  Administered 2016-04-02: 750 mg via INTRAVENOUS
  Filled 2016-04-02: qty 150

## 2016-04-02 MED ORDER — LEVOFLOXACIN IN D5W 500 MG/100ML IV SOLN: 500.0000 mg | INTRAVENOUS | Status: DC

## 2016-04-02 MED ORDER — SENNOSIDES-DOCUSATE SODIUM 8.6-50 MG PO TABS
1.0000 | ORAL_TABLET | Freq: Two times a day (BID) | ORAL | Status: DC
  Administered 2016-04-02 – 2016-04-04 (×5): 1 via ORAL
  Filled 2016-04-02 (×8): qty 1

## 2016-04-02 MED ORDER — EPOETIN ALFA 10000 UNIT/ML IJ SOLN
10000.0000 [IU] | INTRAMUSCULAR | Status: DC
  Administered 2016-04-02: 10000 [IU] via INTRAVENOUS

## 2016-04-02 NOTE — Progress Notes (Signed)
Patient is being followed in the [facility] and will be seen on April 12, 2016 at 11:00am. Thank you.

## 2016-04-02 NOTE — Discharge Instructions (Signed)
Heart Failure Clinic appointment on April 12, 2016 at 11:00am with Darylene Price, [place]. Please call [phone] to reschedule.

## 2016-04-02 NOTE — Consult Note (Addendum)
Pharmacy Antibiotic Note  [name] is a 75 y.o. male admitted on 03/30/2016 with pneumonia.  Pharmacy has been consulted for meropenem dosing.  Plan: Continue meropenem 500 mg iv q 24 hours post-HD. Will f/u planned duration of therapy.   Height: 6\' 2"  (188 cm) Weight: 189 lb 9.5 oz (86 kg) IBW/kg (Calculated) : 82.2  Temp (24hrs), Avg:97.6 F (36.4 C), Min:97.3 F (36.3 C), Max:98 F (36.7 C)   Recent Labs Lab 03/30/16 0935 03/30/16 1153 03/30/16 1530 03/31/16 0557 04/02/16 0950  WBC 13.2*  --   --  7.0 7.9  CREATININE 4.06*  --   --  3.52*  --   LATICACIDVEN  --  0.9 0.9  --   --     Estimated Creatinine Clearance: 21.4 mL/min (by C-G formula based on Cr of 3.52).    Allergies  Allergen Reactions  . Penicillins Hives, Itching and Swelling    Has patient had a PCN reaction causing immediate rash, facial/tongue/throat swelling, SOB or lightheadedness with hypotension: Yes Has patient had a PCN reaction causing severe rash involving mucus membranes or skin necrosis: No Has patient had a PCN reaction that required hospitalization No Has patient had a PCN reaction occurring within the last 10 years: No If all of the above answers are "NO", then may proceed with Cephalosporin use.    Antimicrobials this admission: meropenem 7/8 >>   Dose adjustments this admission:   Microbiology results:  7/8 UCx: cancelled   Thank you for allowing pharmacy to be a part of this patient's care.  Ulice Dash D 04/02/2016 10:35 AM

## 2016-04-02 NOTE — Progress Notes (Addendum)
[place] [facility] [place] NAME: [name]    MR#:  [id number]  DATE OF BIRTH:  [birth year]  SUBJECTIVE:  CHIEF COMPLAINT:   Chief Complaint  Patient presents with  . Shortness of Breath  Continues to have  shortness of breath, cough improved no chest pains    REVIEW OF SYSTEMS:  CONSTITUTIONAL: No fever, has generalized weakness.  EYES: No blurred or double vision.  EARS, NOSE, AND THROAT: No tinnitus or ear pain.  RESPIRATORY: has cough, Positive shortness of breath, no wheezing or hemoptysis.  CARDIOVASCULAR: No chest pain, orthopnea, edema.  GASTROINTESTINAL: No nausea, vomiting, diarrhea or abdominal pain.  GENITOURINARY: No dysuria, hematuria.  ENDOCRINE: No polyuria, nocturia,  HEMATOLOGY: No anemia, easy bruising or bleeding SKIN: No rash or lesion. MUSCULOSKELETAL: No joint pain or arthritis.   NEUROLOGIC: No tingling, numbness, weakness.  PSYCHIATRY: No anxiety or depression.   DRUG ALLERGIES:   Allergies  Allergen Reactions  . Penicillins Hives, Itching and Swelling    Has patient had a PCN reaction causing immediate rash, facial/tongue/throat swelling, SOB or lightheadedness with hypotension: Yes Has patient had a PCN reaction causing severe rash involving mucus membranes or skin necrosis: No Has patient had a PCN reaction that required hospitalization No Has patient had a PCN reaction occurring within the last 10 years: No If all of the above answers are "NO", then may proceed with Cephalosporin use.    VITALS:  Blood pressure 126/76, pulse 76, temperature 97.3 F (36.3 C), temperature source Axillary, resp. rate 29, height 6\' 2"  (1.88 m), weight 86 kg (189 lb 9.5 oz), SpO2 100 %.  PHYSICAL EXAMINATION:  GENERAL:  75 y.o.-year-old patient lying in the bed with no acute distress.  EYES: Pupils equal, round, reactive to light and accommodation. No scleral icterus. Extraocular muscles intact.  HEENT: Head  atraumatic, normocephalic. Oropharynx and nasopharynx clear.  NECK:  Supple, no jugular venous distention. No thyroid enlargement, no tenderness.  LUNGS: Normal breath sounds bilaterally, no wheezing, rales,rhonchi or crepitation. No use of accessory muscles of respiration.  CARDIOVASCULAR: S1, S2 normal. No murmurs, rubs, or gallops.  ABDOMEN: Soft, nontender, nondistended. Bowel sounds present. No organomegaly or mass.  EXTREMITIES: No pedal edema, cyanosis, or clubbing.  NEUROLOGIC: Cranial nerves II through XII are intact. Muscle strength 4/5 in all extremities. Sensation intact. Gait not checked.  PSYCHIATRIC: The patient is alert and oriented x 3.  SKIN: No obvious rash, lesion, or ulcer.    LABORATORY PANEL:   CBC  Recent Labs Lab 04/02/16 0950  WBC 7.9  HGB 7.7*  HCT 23.8*  PLT 223   ------------------------------------------------------------------------------------------------------------------  Chemistries   Recent Labs Lab 03/31/16 0557  NA 141  K 3.5  CL 102  CO2 33*  GLUCOSE 118*  BUN 33*  CREATININE 3.52*  CALCIUM 8.3*  AST 13*  ALT 9*  ALKPHOS 188*  BILITOT 0.9   ------------------------------------------------------------------------------------------------------------------  Cardiac Enzymes  Recent Labs Lab 03/31/16 0557  TROPONINI 0.03*   ------------------------------------------------------------------------------------------------------------------  RADIOLOGY:  No results found.  EKG:   Orders placed or performed during the hospital encounter of 03/30/16  . ED EKG  . ED EKG  . EKG 12-Lead  . EKG 12-Lead    ASSESSMENT AND PLAN:    Acute on chronic respiratory failure. 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 Continue HD. echo yesterday: EF: 35% - 40%. Continue coreg. Hemodialysis as per nephrology  HCAP (healthcare-associated pneumonia, LLL) Levaquin as above  ESRD on hemodialysis ([facility]), continue HD.  COPD. Continue NEB.  Dementia. Fall and aspiration precaution.  Anemia hemoglobin is dropping we'll repeat a CBC in the morning   All the records are reviewed and case discussed with Care Management/Social Workerr. Management plans discussed with the patient, family and they are in agreement.  CODE STATUS: full code.  TOTAL TIME TAKING CARE OF THIS PATIENT: 43minutes.  Greater than 50% time was spent on coordination of care and face-to-face counseling.  POSSIBLE D/C IN 1-2 DAYS, DEPENDING ON CLINICAL CONDITION.   Dustin Flock M.D on 04/02/2016 at 12:52 PM  Between 7am to 6pm - Pager - [phone]  After 6pm go to [email or web address] - password [email or web address] [facility]  Office  [phone]  CC: Primary care physician; Dion Body, MD

## 2016-04-02 NOTE — Progress Notes (Signed)
Hemodialysis completed. 

## 2016-04-02 NOTE — Progress Notes (Signed)
Post hd tx 

## 2016-04-02 NOTE — Consult Note (Signed)
Pharmacy Antibiotic Note  [name] is a 75 y.o. male admitted on 03/30/2016 with pneumonia.  Pharmacy has been consulted for meropenem dosing.  Plan: After discussion with Dr. Posey Pronto, will d/c meropenem and change to Levaquin for a total of 7 days of abx.  Levaquin 750 mg iv once today then 500 mg iv q 48 hours x 1.   Height: 6\' 2"  (188 cm) Weight: 189 lb 9.5 oz (86 kg) IBW/kg (Calculated) : 82.2  Temp (24hrs), Avg:97.6 F (36.4 C), Min:97.3 F (36.3 C), Max:98 F (36.7 C)   Recent Labs Lab 03/30/16 0935 03/30/16 1153 03/30/16 1530 03/31/16 0557 04/02/16 0950  WBC 13.2*  --   --  7.0 7.9  CREATININE 4.06*  --   --  3.52*  --   LATICACIDVEN  --  0.9 0.9  --   --     Estimated Creatinine Clearance: 21.4 mL/min (by C-G formula based on Cr of 3.52).    Allergies  Allergen Reactions  . Penicillins Hives, Itching and Swelling    Has patient had a PCN reaction causing immediate rash, facial/tongue/throat swelling, SOB or lightheadedness with hypotension: Yes Has patient had a PCN reaction causing severe rash involving mucus membranes or skin necrosis: No Has patient had a PCN reaction that required hospitalization No Has patient had a PCN reaction occurring within the last 10 years: No If all of the above answers are "NO", then may proceed with Cephalosporin use.    Antimicrobials this admission: meropenem 7/8 >> 7/11 Levaquin 7/11 >>  Dose adjustments this admission:   Microbiology results:  7/8 UCx: cancelled   Thank you for allowing pharmacy to be a part of this patient's care.  Ulice Dash D 04/02/2016 12:49 PM

## 2016-04-02 NOTE — Progress Notes (Signed)
PRE HD   

## 2016-04-02 NOTE — Progress Notes (Signed)
PRE HD ASSESSMENT 

## 2016-04-02 NOTE — Progress Notes (Signed)
Physical Therapy Treatment Patient Details Name: [name] MRN: [id number] DOB: [birth year] Today's Date: 04/02/2016    History of Present Illness Pt is a 75 yr old male presenting from long term care at [facility] with worsening SOB. Admitted with acute on chronic CHF and LLL pneumonia. PMH significant for ESRD (HD T,Th,Sat), CHF, Afib, anemia, COPD, HTN, CA (bladder, prostate), and h/o AAA with endoleak, CVA, SBO, and septic shock.    PT Comments    Pt remains very motivated to increase independence with functional mobility. Pt was able to participate in supine TherEx with some SOB, though SaO2 maintained >95% throughout session. With max A x2, pt completed 4 trials of sit <> stand transfers, again only able to achieve ~75% of full stand. Remains limited by generalized weakness and deconditioning. Will continue to follow with skilled acute PT for continued transfer/functional mobility training.   Follow Up Recommendations  Home health PT     Equipment Recommendations  Other (comment) (has equipment at home)    Recommendations for Other Services       Precautions / Restrictions Precautions Precautions: Fall Restrictions Weight Bearing Restrictions: No    Mobility  Bed Mobility Overal bed mobility: Needs Assistance Bed Mobility: Supine to Sit;Sit to Supine Supine to sit: Mod assist;HOB elevated Sit to supine: Max assist;+2 for physical assistance General bed mobility comments: Participates in supine > sit with bilat LEs but requires mod A at trunk; vc's for process; HOB elevated ~50 degrees; increased time required. Pt fatigued at end of session and needing to be transferred back to supine for transport to HD; max A x2.   Transfers Overall transfer level: Needs assistance Equipment used: Rolling walker (2 wheeled) Transfers: Sit to/from Stand (x 4 trials) Sit to Stand: Max assist;+2 physical assistance General transfer comment: Trialed 4 attempts of sit <> stand  from raised surface (EOB) with max A x2, remains unable to assume full stand with each. Stands with flexed posturing and is unable to fully extend hips or knees with cueing. Limited by generalized weakness and deconditioning. Vc's for hand/foot placement and technique.   Ambulation/Gait General Gait Details: Unsafe to assess   Stairs    Wheelchair Mobility    Modified Rankin (Stroke Patients Only)       Balance Overall balance assessment: Needs assistance Sitting-balance support: Feet supported;Bilateral upper extremity supported Sitting balance-Leahy Scale: Fair Sitting balance - Comments: Pt requiring CGA-close standby to maintain balance sitting EOB    Cognition Arousal/Alertness: Awake/alert Behavior During Therapy: WFL for tasks assessed/performed Overall Cognitive Status: Within Functional Limits for tasks assessed    Exercises General Exercises - Lower Extremity Ankle Circles/Pumps: AROM Short Arc Quad: AROM Heel Slides: AROM Hip ABduction/ADduction: AROM Straight Leg Raises: AROM All exercises performed bilaterally x 10 reps in supine. Increased time required.    General Comments Nursing cleared pt for participation in physical therapy, asking that pt be returned to supine in order for transport to HD.  Pt agreeable to PT session.       Pertinent Vitals/Pain Pain Assessment: No/denies pain  HR and O2 monitored throughout session and maintained WFL.            PT Goals (current goals can now be found in the care plan section) Acute Rehab PT Goals Patient Stated Goal: To walk again PT Goal Formulation: With patient Time For Goal Achievement: 04/15/16 Potential to Achieve Goals: Good    Frequency  Min 2X/week    PT Plan Current plan remains  appropriate       End of Session Equipment Utilized During Treatment: Gait belt;Oxygen Activity Tolerance: Patient limited by fatigue Patient left: in bed;with nursing/sitter in room (RN tech in room to  transport pt to HD)     Time: [phone] PT Time Calculation (min) (ACUTE ONLY): 27 min  Charges:                       G Codes:      Valor [place], SPT 04/02/2016, 1:20 PM

## 2016-04-02 NOTE — Progress Notes (Signed)
START OF HD 

## 2016-04-02 NOTE — Progress Notes (Signed)
[facility]  ROUNDING NOTE   Subjective:   Seen and examined on hemodialysis. Tolerating treatment well.     HEMODIALYSIS FLOWSHEET:  Blood Flow Rate (mL/min): 400 mL/min Arterial Pressure (mmHg): -180 mmHg Venous Pressure (mmHg): 180 mmHg Transmembrane Pressure (mmHg): 70 mmHg Ultrafiltration Rate (mL/min): 830 mL/min Dialysate Flow Rate (mL/min): 600 ml/min Conductivity: Machine : 14 Conductivity: Machine : 14 Dialysis Fluid Bolus: Normal Saline Bolus Amount (mL): 250 mL (prime) Dialysate Change: Other (comment) (3k) Intra-Hemodialysis Comments: pt resting, vss    Objective:  Vital signs in last 24 hours:  Temp:  [97.3 F (36.3 C)-98 F (36.7 C)] 97.3 F (36.3 C) (07/11 0940) Pulse Rate:  [73-118] 76 (07/11 1000) Resp:  [20-30] 27 (07/11 1000) BP: (109-124)/(66-76) 109/66 mmHg (07/11 1000) SpO2:  [84 %-100 %] 100 % (07/11 1000) Weight:  [85.503 kg (188 lb 8 oz)-86 kg (189 lb 9.5 oz)] 86 kg (189 lb 9.5 oz) (07/11 0940)  Weight change: 3.447 kg (7 lb 9.6 oz) Filed Weights   04/01/16 0500 04/02/16 0500 04/02/16 0940  Weight: 82.056 kg (180 lb 14.4 oz) 85.503 kg (188 lb 8 oz) 86 kg (189 lb 9.5 oz)    Intake/Output: I/O last 3 completed shifts: In: [id number] [P.O.:1310; IV Piggyback:46] Out: 0    Intake/Output this shift:     Physical Exam: General: NAD  Head: Normocephalic, atraumatic. Moist oral mucosal membranes  Eyes: Anicteric  Neck: Supple, trachea midline  Lungs:  Basilar rales, otherwise clear, normal effort  Heart: S1S2 no rubs  Abdomen:  Soft, nontender, BS present   Extremities: no peripheral edema.  Neurologic: Nonfocal, moving all four extremities  Skin: No lesions  Access: LUE AVF    Basic Metabolic Panel:  Recent Labs Lab 03/30/16 0935 03/30/16 1800 03/31/16 0557  NA 139  --  141  K 3.6  --  3.5  CL 98*  --  102  CO2 32  --  33*  GLUCOSE 116*  --  118*  BUN 46*  --  33*  CREATININE 4.06*  --  3.52*  CALCIUM 8.4*  --   8.3*  PHOS  --  1.8*  --     Liver Function Tests:  Recent Labs Lab 03/30/16 0935 03/31/16 0557  AST 16 13*  ALT 12* 9*  ALKPHOS 234* 188*  BILITOT 1.2 0.9  PROT 6.0* 5.5*  ALBUMIN 2.7* 2.4*   No results for input(s): LIPASE, AMYLASE in the last 168 hours. No results for input(s): AMMONIA in the last 168 hours.  CBC:  Recent Labs Lab 03/30/16 0935 03/31/16 0557 04/02/16 0950  WBC 13.2* 7.0 7.9  NEUTROABS 11.6*  --   --   HGB 8.7* 8.2* 7.7*  HCT 26.9* 25.4* 23.8*  MCV 86.1 86.9 85.5  PLT 189 157 223    Cardiac Enzymes:  Recent Labs Lab 03/30/16 0935 03/30/16 1153 03/30/16 1800 03/30/16 2343 03/31/16 0557  TROPONINI 0.03* <0.03 0.04* <0.03 0.03*    BNP: Invalid input(s): POCBNP  CBG:  Recent Labs Lab 03/31/16 0727 04/02/16 0757  GLUCAP 101* 116*    Microbiology: Results for orders placed or performed during the hospital encounter of 03/12/16  MRSA PCR Screening     Status: None   Collection Time: 03/12/16  3:30 PM  Result Value Ref Range Status   MRSA by PCR NEGATIVE NEGATIVE Final    Comment:        The GeneXpert MRSA Assay (FDA approved for NASAL specimens only), is one component of a  comprehensive MRSA colonization surveillance program. It is not intended to diagnose MRSA infection nor to guide or monitor treatment for MRSA infections.     Coagulation Studies: No results for input(s): LABPROT, INR in the last 72 hours.  Urinalysis: No results for input(s): COLORURINE, LABSPEC, PHURINE, GLUCOSEU, HGBUR, BILIRUBINUR, KETONESUR, PROTEINUR, UROBILINOGEN, NITRITE, LEUKOCYTESUR in the last 72 hours.  Invalid input(s): APPERANCEUR    Imaging: No results found.   Medications:     . amiodarone  200 mg Oral Daily  . aspirin EC  81 mg Oral Daily  . atorvastatin  40 mg Oral QHS  . carvedilol  3.125 mg Oral BID WC  . docusate sodium  100 mg Oral BID  . epoetin (EPOGEN/PROCRIT) injection  10,000 Units Intravenous Q T,Th,Sa-HD  .  famotidine  20 mg Oral Q48H  . ferrous sulfate  325 mg Oral Daily  . guaiFENesin  600 mg Oral BID  . heparin  5,000 Units Subcutaneous Q8H  . ipratropium-albuterol  3 mL Nebulization QID  . lamoTRIgine  25 mg Oral QHS  . [START ON 04/05/2016] lamoTRIgine  50 mg Oral QHS  . magnesium oxide  400 mg Oral QPM  . meropenem (MERREM) IV  500 mg Intravenous Q24H  . mometasone-formoterol  2 puff Inhalation BID  . nitroGLYCERIN  0.5 inch Topical Q8H  . sevelamer carbonate  800 mg Oral TID AC  . tiotropium  18 mcg Inhalation Daily   acetaminophen **OR** acetaminophen, albuterol, bisacodyl, LORazepam, morphine injection, ondansetron **OR** ondansetron (ZOFRAN) IV, oxyCODONE  Assessment/ Plan:  75 y.o. 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   1. ESRD on HD TTHS: seen and examined on dialysis. Tolerating treatment well.   2. Anemia of CKD:  - Continue Epogen with dialysis and also iron supplements.  3. Secondary Hyperparathyroidism:  - Continue Renvela 800 mg by mouth 3 times a day.  4. Hypertension:  Well controlled - carvedilol.    LOS: [place], [place] 7/11/201710:36 AM

## 2016-04-03 ENCOUNTER — Inpatient Hospital Stay: Payer: Medicare Other

## 2016-04-03 LAB — GLUCOSE, CAPILLARY: Glucose-Capillary: 114 mg/dL — ABNORMAL HIGH (ref 65–99)

## 2016-04-03 LAB — BODY FLUID CELL COUNT WITH DIFFERENTIAL
EOS FL: 2 %
LYMPHS FL: 25 %
Monocyte-Macrophage-Serous Fluid: 28 %
Neutrophil Count, Fluid: 45 %
Total Nucleated Cell Count, Fluid: 844 cu mm

## 2016-04-03 LAB — CBC
HEMATOCRIT: 24.5 % — AB (ref 40.0–52.0)
Hemoglobin: 7.8 g/dL — ABNORMAL LOW (ref 13.0–18.0)
MCH: 27.7 pg (ref 26.0–34.0)
MCHC: 32 g/dL (ref 32.0–36.0)
MCV: 86.6 fL (ref 80.0–100.0)
Platelets: 171 10*3/uL (ref 150–440)
RBC: 2.83 MIL/uL — ABNORMAL LOW (ref 4.40–5.90)
RDW: 18 % — AB (ref 11.5–14.5)
WBC: 6.7 10*3/uL (ref 3.8–10.6)

## 2016-04-03 LAB — PROTEIN, BODY FLUID

## 2016-04-03 NOTE — Progress Notes (Signed)
[facility] NAME: [name]    MR#:  [id number]  DATE OF BIRTH:  December 31, 1940  SUBJECTIVE:  Continues to have  shortness of breath, cough improved no chest pains  REVIEW OF SYSTEMS:  CONSTITUTIONAL: No fever, has generalized weakness.  EYES: No blurred or double vision.  EARS, NOSE, AND THROAT: No tinnitus or ear pain.  RESPIRATORY: has cough, Positive shortness of breath, no wheezing or hemoptysis.  CARDIOVASCULAR: No chest pain, orthopnea, edema.  GASTROINTESTINAL: No nausea, vomiting, diarrhea or abdominal pain.  GENITOURINARY: No dysuria, hematuria.  ENDOCRINE: No polyuria, nocturia,  HEMATOLOGY: No anemia, easy bruising or bleeding SKIN: No rash or lesion. MUSCULOSKELETAL: No joint pain or arthritis.   NEUROLOGIC: No tingling, numbness, weakness.  PSYCHIATRY: No anxiety or depression.   DRUG ALLERGIES:   Allergies  Allergen Reactions  . Penicillins Hives, Itching and Swelling    Has patient had a PCN reaction causing immediate rash, facial/tongue/throat swelling, SOB or lightheadedness with hypotension: Yes Has patient had a PCN reaction causing severe rash involving mucus membranes or skin necrosis: No Has patient had a PCN reaction that required hospitalization No Has patient had a PCN reaction occurring within the last 10 years: No If all of the above answers are "NO", then may proceed with Cephalosporin use.    VITALS:  Blood pressure 117/67, pulse 65, temperature 97.9 F (36.6 C), temperature source Oral, resp. rate 22, height 6\' 2"  (1.88 m), weight 83.1 kg (183 lb 3.2 oz), SpO2 95 %.  PHYSICAL EXAMINATION:  GENERAL:  75 y.o.-year-old patient lying in the bed with no acute distress.  EYES: Pupils equal, round, reactive to light and accommodation. No scleral icterus. Extraocular muscles intact.  HEENT: Head atraumatic, normocephalic. Oropharynx and nasopharynx clear.  NECK:  Supple, no jugular venous  distention. No thyroid enlargement, no tenderness.  LUNGS: decreased breath sounds bilaterally right >left - no wheezing, rales,rhonchi or crepitation. No use of accessory muscles of respiration.  CARDIOVASCULAR: S1, S2 normal. No murmurs, rubs, or gallops.  ABDOMEN: Soft, nontender, nondistended. Bowel sounds present. No organomegaly or mass.  EXTREMITIES: No pedal edema, cyanosis, or clubbing.  NEUROLOGIC: Cranial nerves II through XII are intact. Muscle strength 4/5 in all extremities. Sensation intact. Gait not checked.  PSYCHIATRIC:  patient is alert and oriented x 3.  SKIN: No obvious rash, lesion, or ulcer.    LABORATORY PANEL:   CBC  Recent Labs Lab 04/03/16 0540  WBC 6.7  HGB 7.8*  HCT 24.5*  PLT 171   ------------------------------------------------------------------------------------------------------------------  Chemistries   Recent Labs Lab 03/31/16 0557 04/02/16 0950  NA 141 138  K 3.5 5.1  CL 102 98*  CO2 33* 28  GLUCOSE 118* 123*  BUN 33* 72*  CREATININE 3.52* 6.26*  CALCIUM 8.3* 8.3*  AST 13*  --   ALT 9*  --   ALKPHOS 188*  --   BILITOT 0.9  --    ------------------------------------------------------------------------------------------------------------------  Cardiac Enzymes  Recent Labs Lab 03/31/16 0557  TROPONINI 0.03*   ------------------------------------------------------------------------------------------------------------------  RADIOLOGY:  Dg Chest 1 View  04/03/2016  CLINICAL DATA:  Post left-sided thoracentesis EXAM: CHEST 1 VIEW COMPARISON:  03/31/2016; 03/30/2016; chest CT - 04/02/2016 FINDINGS: Grossly unchanged enlarged cardiac silhouette and mediastinal contours with atherosclerotic plaque within the thoracic aorta. Interval reduction in persistent trace left-sided effusion post thoracentesis. No pneumothorax. Unchanged small right-sided effusion. Improved aeration of the left lung base with persistent bibasilar  heterogeneous opacities, likely atelectasis. No  new focal airspace opacities. Pulmonary venous congestion without frank evidence of edema. No acute osseus abnormalities. IMPRESSION: 1. Interval reduction in persistent trace left-sided effusion post thoracentesis. No pneumothorax. 2. Improved aeration of left lung base with persistent bibasilar opacities, right greater than left, likely atelectasis. 3. Unchanged trace right-sided effusion. 4. Pulmonary venous congestion without frank evidence of edema. 5.  Aortic Atherosclerosis (ICD10-170.0) Electronically Signed   By: Sandi Mariscal M.D.   On: 04/03/2016 11:38   Ct Chest Wo Contrast  04/02/2016  CLINICAL DATA:  Worsening shortness of Breath, history of left lower lobe pneumonia EXAM: CT CHEST WITHOUT CONTRAST TECHNIQUE: Multidetector CT imaging of the chest was performed following the standard protocol without IV contrast. COMPARISON:  06/06/2015 FINDINGS: Cardiovascular: The thoracic aorta demonstrates diffuse calcification. Additionally there is dilatation of the descending aorta 5.2 cm. This is stable in appearance from the prior exam. Heavy coronary calcifications are noted. Mediastinum/Nodes: No significant hilar or mediastinal adenopathy is noted. Lungs/Pleura: Emphysematous changes are noted. Additionally there are loculated appearing pleural effusions bilaterally with only mild lower lobe atelectasis. The effusion is worse on the left. The changes on the right appear stable from the previous exam with the changes on the left increasing from the prior study. Upper Abdomen: Visualized upper abdomen shows no acute abnormality. The most superior aspect of the a aortic stent graft is seen. Musculoskeletal: Degenerative changes of the thoracic spine are noted. IMPRESSION: Bilateral pleural effusions. The right-sided effusion is stable from the previous exam from September of 2016. The left-sided changes have increased in the interval from the prior study. Stable  dilatation of the descending aorta. Bibasilar atelectatic changes increased on the left when compared with the prior study. Electronically Signed   By: Inez Catalina M.D.   On: 04/02/2016 19:08   US Thoracentesis Asp Pleural Space W/img Guide  04/03/2016  INDICATION: Recurrent admissions for CHF now with symptomatic bilateral pleural effusions. Please perform left-sided thoracentesis for therapeutic and diagnostic purposes. EXAM: US THORACENTESIS ASP PLEURAL SPACE W/IMG GUIDE COMPARISON:  03/31/2016; chest CT -04/02/2026 MEDICATIONS: None. COMPLICATIONS: None immediate. TECHNIQUE: Informed written consent was obtained from the patient after a discussion of the risks, benefits and alternatives to treatment. A timeout was performed prior to the initiation of the procedure. Initial ultrasound scanning demonstrates a small left-sided pleural effusion. The lower chest was prepped and draped in the usual sterile fashion. 1% lidocaine was used for local anesthesia. An ultrasound image was saved for documentation purposes. An 8 Fr Safe-T-Centesis catheter was introduced. The thoracentesis was performed. The catheter was removed and a dressing was applied. The patient tolerated the procedure well without immediate post procedural complication. The patient was escorted to have an upright chest radiograph. FINDINGS: A total of approximately 0.5 liters of serous fluid was removed. Requested samples were sent to the laboratory. IMPRESSION: Successful ultrasound-guided left sided thoracentesis yielding 0.5 liters of pleural fluid. Electronically Signed   By: Sandi Mariscal M.D.   On: 04/03/2016 11:39   ASSESSMENT AND PLAN:   * Acute on chronic respiratory failure. - reviewed patient's previous records has a history of having large right-sided pleural effusion -CT scan of the chest  Shows worsening left sided loculated effusion. 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 Continue HD. echo yesterday: EF: 35% - 40%. Continue coreg. Hemodialysis as per  nephrology  *ESRD on hemodialysis [facility]), continue HD.  *COPD. Continue NEB.  *Dementia. Fall and aspiration precaution.  *Anemia hemoglobin stable  Spoke with cousin Ms IDA Pt will d/c to STR in am 04/04/16   All the records are reviewed and case discussed with Care Management/Social Workerr. Management plans discussed with the patient, family and they are in agreement.  CODE STATUS: full code.  TOTAL TIME TAKING CARE OF THIS PATIENT: 21minutes.  Greater than 50% time was spent on coordination of care and face-to-face counseling.  POSSIBLE D/C IN 1 DAYS, DEPENDING ON CLINICAL CONDITION.   Ayriel Texidor M.D on 04/03/2016 at 1:00 PM  Between 7am to 6pm - Pager - [phone]  After 6pm go to [email or web address] - password [email or web address] [facility]  Office  [phone]  CC: Primary care physician; Dion Body, MD

## 2016-04-03 NOTE — Progress Notes (Signed)
[facility]  ROUNDING NOTE   Subjective:   Hemodialysis treatment yesterday. Tolerated treatment well. UF goal of 2 litres.   Left sided thoracentesis is planned for today.   Objective:  Vital signs in last 24 hours:  Temp:  [97.2 F (36.2 C)-98 F (36.7 C)] 97.9 F (36.6 C) (07/12 0436) Pulse Rate:  [70-83] 70 (07/12 0514) Resp:  [11-20] 20 (07/12 0436) BP: (93-131)/(55-82) 123/80 mmHg (07/12 0514) SpO2:  [96 %-100 %] 100 % (07/12 0741) Weight:  [83.1 kg (183 lb 3.2 oz)-84 kg (185 lb 3 oz)] 83.1 kg (183 lb 3.2 oz) (07/12 0500)  Weight change: 0.497 kg (1 lb 1.5 oz) Filed Weights   04/02/16 0940 04/02/16 1303 04/03/16 0500  Weight: 86 kg (189 lb 9.5 oz) 84 kg (185 lb 3 oz) 83.1 kg (183 lb 3.2 oz)    Intake/Output: I/O last 3 completed shifts: In: 54 [P.O.:390; IV Piggyback:46] Out: 2000 [Other:2000]   Intake/Output this shift:  Total I/O In: 240 [P.O.:240] Out: -   Physical Exam: General: NAD, sitting in chair  Head: Normocephalic, atraumatic. Moist oral mucosal membranes  Eyes: Anicteric  Neck: Supple, trachea midline  Lungs:  Clear   Heart: S1S2 no rubs  Abdomen:  Soft, nontender, BS present   Extremities: no peripheral edema.  Neurologic: Nonfocal, moving all four extremities  Skin: No lesions  Access: LUE AVF    Basic Metabolic Panel:  Recent Labs Lab 03/30/16 0935 03/30/16 1800 03/31/16 0557 04/02/16 0950  NA 139  --  141 138  K 3.6  --  3.5 5.1  CL 98*  --  102 98*  CO2 32  --  33* 28  GLUCOSE 116*  --  118* 123*  BUN 46*  --  33* 72*  CREATININE 4.06*  --  3.52* 6.26*  CALCIUM 8.4*  --  8.3* 8.3*  PHOS  --  1.8*  --  4.7*    Liver Function Tests:  Recent Labs Lab 03/30/16 0935 03/31/16 0557 04/02/16 0950  AST 16 13*  --   ALT 12* 9*  --   ALKPHOS 234* 188*  --   BILITOT 1.2 0.9  --   PROT 6.0* 5.5*  --   ALBUMIN 2.7* 2.4* 2.4*   No results for input(s): LIPASE, AMYLASE in the last 168 hours. No results for  input(s): AMMONIA in the last 168 hours.  CBC:  Recent Labs Lab 03/30/16 0935 03/31/16 0557 04/02/16 0950 04/03/16 0540  WBC 13.2* 7.0 7.9 6.7  NEUTROABS 11.6*  --   --   --   HGB 8.7* 8.2* 7.7* 7.8*  HCT 26.9* 25.4* 23.8* 24.5*  MCV 86.1 86.9 85.5 86.6  PLT 189 157 223 171    Cardiac Enzymes:  Recent Labs Lab 03/30/16 0935 03/30/16 1153 03/30/16 1800 03/30/16 2343 03/31/16 0557  TROPONINI 0.03* <0.03 0.04* <0.03 0.03*    BNP: Invalid input(s): POCBNP  CBG:  Recent Labs Lab 03/31/16 0727 04/02/16 0757 04/03/16 0722  GLUCAP 101* 116* 114*    Microbiology: Results for orders placed or performed during the hospital encounter of 03/12/16  MRSA PCR Screening     Status: None   Collection Time: 03/12/16  3:30 PM  Result Value Ref Range Status   MRSA by PCR NEGATIVE NEGATIVE Final    Comment:        The GeneXpert MRSA Assay (FDA approved for NASAL specimens only), is one component of a comprehensive MRSA colonization surveillance program. It is not intended to  diagnose MRSA infection nor to guide or monitor treatment for MRSA infections.     Coagulation Studies: No results for input(s): LABPROT, INR in the last 72 hours.  Urinalysis: No results for input(s): COLORURINE, LABSPEC, PHURINE, GLUCOSEU, HGBUR, BILIRUBINUR, KETONESUR, PROTEINUR, UROBILINOGEN, NITRITE, LEUKOCYTESUR in the last 72 hours.  Invalid input(s): APPERANCEUR    Imaging: Ct Chest Wo Contrast  04/02/2016  CLINICAL DATA:  Worsening shortness of Breath, history of left lower lobe pneumonia EXAM: CT CHEST WITHOUT CONTRAST TECHNIQUE: Multidetector CT imaging of the chest was performed following the standard protocol without IV contrast. COMPARISON:  06/06/2015 FINDINGS: Cardiovascular: The thoracic aorta demonstrates diffuse calcification. Additionally there is dilatation of the descending aorta 5.2 cm. This is stable in appearance from the prior exam. Heavy coronary calcifications are  noted. Mediastinum/Nodes: No significant hilar or mediastinal adenopathy is noted. Lungs/Pleura: Emphysematous changes are noted. Additionally there are loculated appearing pleural effusions bilaterally with only mild lower lobe atelectasis. The effusion is worse on the left. The changes on the right appear stable from the previous exam with the changes on the left increasing from the prior study. Upper Abdomen: Visualized upper abdomen shows no acute abnormality. The most superior aspect of the a aortic stent graft is seen. Musculoskeletal: Degenerative changes of the thoracic spine are noted. IMPRESSION: Bilateral pleural effusions. The right-sided effusion is stable from the previous exam from September of 2016. The left-sided changes have increased in the interval from the prior study. Stable dilatation of the descending aorta. Bibasilar atelectatic changes increased on the left when compared with the prior study. Electronically Signed   By: Inez Catalina M.D.   On: 04/02/2016 19:08     Medications:     . amiodarone  200 mg Oral Daily  . atorvastatin  40 mg Oral QHS  . carvedilol  3.125 mg Oral BID WC  . famotidine  20 mg Oral Q48H  . ferrous sulfate  325 mg Oral Daily  . guaiFENesin  600 mg Oral BID  . heparin  5,000 Units Subcutaneous Q8H  . ipratropium-albuterol  3 mL Nebulization QID  . lamoTRIgine  25 mg Oral QHS  . [START ON 04/05/2016] lamoTRIgine  50 mg Oral QHS  . [START ON 04/04/2016] levofloxacin (LEVAQUIN) IV  500 mg Intravenous Q48H  . magnesium oxide  400 mg Oral QPM  . mometasone-formoterol  2 puff Inhalation BID  . nitroGLYCERIN  0.5 inch Topical Q8H  . senna-docusate  1 tablet Oral BID  . sevelamer carbonate  800 mg Oral TID AC  . tiotropium  18 mcg Inhalation Daily   acetaminophen **OR** acetaminophen, albuterol, bisacodyl, LORazepam, ondansetron **OR** ondansetron (ZOFRAN) IV, oxyCODONE  Assessment/ Plan:  75 y.o. 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   1. ESRD on HD TTS: dialysis treatment yesterday. Tolerated treatment. Next treatment for tomorrow.   2. Anemia of CKD:  - Continue Epogen with dialysis and also iron supplements.  3. Secondary Hyperparathyroidism:  - Continue Renvela 800 mg by mouth 3 times a day.  4. Hypertension:  Well controlled - carvedilol.    LOS: [place], [place] 7/12/201711:09 AM

## 2016-04-03 NOTE — Care Management Important Message (Signed)
Important Message  Patient Details  Name: [name] MRN: [id number] Date of Birth: 1941-09-14   Medicare Important Message Given:  Yes    Juliann Pulse A [place] 04/03/2016, 2:34 PM

## 2016-04-03 NOTE — Progress Notes (Signed)
Chaplain rounded the unit to provide a compassionate presence and support to the 74 year-old patient prior to a surgical procedure. He was a little agitated and seemed to calm down after prayer. [name] [phone]

## 2016-04-03 NOTE — Procedures (Signed)
Successful US guided left sided thoracentesis yielding 0.5 L of serous pleural fluid.   Samples sent to lab for analysis. EBL: None No immediate complications.  Ronny Bacon, MD Pager #: [phone]

## 2016-04-03 NOTE — Progress Notes (Signed)
PT Cancellation Note  Patient Details Name: [name] MRN: [id number] DOB: 25-May-1941   Cancelled Treatment:    Reason Eval/Treat Not Completed: Patient at procedure or test/unavailable. Pt off unit for thoracentesis. Will re-attempt PT treatment at a later date/time when pt is available.    [name], SPT 04/03/2016, 11:11 AM

## 2016-04-04 LAB — MISC LABCORP TEST (SEND OUT): Labcorp test code: 19588

## 2016-04-04 LAB — GLUCOSE, CAPILLARY: Glucose-Capillary: 115 mg/dL — ABNORMAL HIGH (ref 65–99)

## 2016-04-04 LAB — CYTOLOGY - NON PAP

## 2016-04-04 MED ORDER — EPOETIN ALFA 10000 UNIT/ML IJ SOLN
10000.0000 [IU] | Freq: Once | INTRAMUSCULAR | Status: AC
  Administered 2016-04-04: 10000 [IU] via INTRAVENOUS

## 2016-04-04 MED ORDER — LORAZEPAM 0.5 MG PO TABS: 0.5000 mg | ORAL_TABLET | ORAL | Status: DC | PRN

## 2016-04-04 MED ORDER — OXYCODONE HCL 5 MG PO TABS
5.0000 mg | ORAL_TABLET | Freq: Four times a day (QID) | ORAL | Status: DC | PRN
Start: 2016-04-04 — End: 2016-05-09

## 2016-04-04 NOTE — Clinical Social Work Note (Signed)
CSW has notified patient's cousin, [name], this morning regarding discharge. [name] MSW,LCSW [phone]

## 2016-04-04 NOTE — Discharge Summary (Signed)
[name], is a 75 y.o. male  DOB 1941-04-07  MRN [id number].  Admission date:  03/30/2016  Admitting Physician  Idelle Crouch, MD  Discharge Date:  04/04/2016   Primary MD  Dion Body, MD  Recommendations for primary care physician for things to follow:  F/u PMD in one week F/u CHF clinic as scheduled   Admission Diagnosis  CHF (congestive heart failure) ([place]) [I50.9] Dyspnea [R06.00] Healthcare-associated pneumonia [J18.9] Acute combined systolic and diastolic congestive heart failure ([place]) [I50.41]   Discharge Diagnosis  CHF (congestive heart failure) ([place]) [I50.9] Dyspnea [R06.00] Healthcare-associated pneumonia [J18.9] Acute combined systolic and diastolic congestive heart failure ([place]) [I50.41]    Principal Problem:   Acute on chronic diastolic (congestive) heart failure ([place]) Active Problems:   ESRD on hemodialysis ([place])   Acute respiratory distress ([place])   HCAP (healthcare-associated pneumonia)      Past Medical History  Diagnosis Date  . PAF (paroxysmal atrial fibrillation) ([place])     a. new onset 12/2014 in the setting of UTI, sepsis, hypotension, and anemia; b. not on long term anticoagulation given anemia; c. family aware of stroke risk, they are ok with this; d. on amiodarone   . Chronic combined systolic and diastolic CHF (congestive heart failure) ([place])     a. echo 12/2014: EF 25-30%, anterior wall wall motion abnormalities; b. planned ischemic evaluation once patient is stable medically; c. echo 01/2015: EF 45-50%, no RWMA, mild AT, LA mildly dilated, mod pericardial effusion along LV free wall, no evidence of hemodynamic compromise  . ESRD on hemodialysis ([place])     a. Tuesday, Thursday, and Saturdays  . Anemia     a. baseline hgb ~ 8  . History of small bowel obstruction     a.  01/2015  . History of septic shock     a. 01/2015  . GERD (gastroesophageal reflux disease)   . Allergic rhinitis   . COPD (chronic obstructive pulmonary disease) ([place])   . Hypercholesteremia   . Cognitive communication deficit   . Magnesium deficiency   . Dementia   . 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 Incontinence   . Hydronephrosis   . Overactive bladder   . Detrusor sphincter dyssynergia   . Mini stroke ([place])   . Bladder cancer ([place])   . Prostate cancer ([place])   . Hypertension     Past Surgical History  Procedure Laterality Date  . Cystoscopy w/ ureteral stent placement    . Transurethral resection of bladder tumor with gyrus (turbt-gyrus)    . Ureteral stent placement    . Prostate surgery      Removal  . Av fistula placement      left arm  . Peripheral vascular catheterization N/A 01/24/2016    Procedure: Endovascular Repair/Stent Graft;  Surgeon: Katha Cabal, MD;  Location: [facility];  Service: Cardiovascular;  Laterality: N/A;       History of present illness and  Hospital Course:     Kindly see H&P for history of present illness and admission details, please review complete Labs, Consult reports and Test reports for all details in brief  HPI  from the history and physical done on the day of admission 75 year old male patient with history of ESRD on hemodialysis Tuesday Thursday Saturday, essential hypertension, combined systolic and diastolic heart failure comes in because of shortness of breath. Found to have left pleural effusion. Patient initially received antibiotics for pneumonia/ Admitted to hospitalist service.  Hospital Course  ACUTE  on chronic respiratory failure secondary to large left pleural effusion: Patient CT chest showed no mass, patient has bilateral pleural effusion but more worse on the left side. Seen by interventional radiology, pleural fluid drained, patient had about 500 mL of fluid drained from the left lung. After the thoracocentesis  patient feels much better.Fluid cultures were negative for any organism at this time. Fungal cultures are. Pending. Patient feels much better, seen by cardiology, CHF coordinator. follow up in CHF clinic on July 21.  #2 ESRD and hemodialysis Tuesday Thursday Saturday. Seen by nephrology. Patient will be discharged after hemodialysis today. #3 .pneumonia ruled out: Initially received meropenem, changed to Levaquin later. Antibiotics are stopped because patient has shortness of breath because of CHF, not due to pneumonia.  #4 proximal atrial fibrillation: Controlled. Continue amiodarone, Coreg. #5 history of PD: Chronic respiratory failure: Continue oxygen that he uses before, continue Spiriva, #6 history of GERD continue PPIs  Discharge Condition: STABLE   Follow UP  Follow-up Information    Follow up with Alisa Graff, FNP On 04/12/2016.   Specialty:  Family Medicine   Why:  at 11:00am , to the [facility]   Contact information:   [facility] [address] [id number] [phone]         Discharge Instructions  and  Discharge Medications        Medication List    TAKE these medications        acetaminophen 325 MG tablet  Commonly known as:  TYLENOL  Take 650 mg by mouth every 8 (eight) hours as needed. For general discomfort     albuterol (2.5 MG/3ML) 0.083% nebulizer solution  Commonly known as:  PROVENTIL  Take 3 mLs (2.5 mg total) by nebulization every 4 (four) hours as needed for wheezing or shortness of breath.     amiodarone 200 MG tablet  Commonly known as:  PACERONE  Take 1 tablet (200 mg total) by mouth daily.     atorvastatin 40 MG tablet  Commonly known as:  LIPITOR  Take 40 mg by mouth at bedtime.     budesonide-formoterol 160-4.5 MCG/ACT inhaler  Commonly known as:  SYMBICORT  Inhale 2 puffs into the lungs 2 (two) times daily.     carvedilol 3.125 MG tablet  Commonly known as:  COREG  Take 1 tablet (3.125 mg total)  by mouth 2 (two) times daily with a meal.     ferrous sulfate 325 (65 FE) MG tablet  Take 325 mg by mouth daily.     guaifenesin 100 MG/5ML syrup  Commonly known as:  ROBITUSSIN  Take 200 mg by mouth every 4 (four) hours as needed for cough.     guaiFENesin 600 MG 12 hr tablet  Commonly known as:  MUCINEX  Take 600 mg by mouth 2 (two) times daily. (Take every morning and at bedtime for cough)     ipratropium-albuterol 0.5-2.5 (3) MG/3ML Soln  Commonly known as:  DUONEB  Take 3 mLs by nebulization 3 (three) times daily.     lamoTRIgine 25 MG tablet  Commonly known as:  LAMICTAL  Take 25 mg by mouth at bedtime. X 7 days, then increase to 2 tablets (50 mg) by mouth at bedtime.     lamoTRIgine 25 MG tablet  Commonly known as:  LAMICTAL  Take 50 mg by mouth at bedtime. Start on July 14th.  Start taking on:  04/05/2016  LORazepam 0.5 MG tablet  Commonly known as:  ATIVAN  Take 1 tablet (0.5 mg total) by mouth every 4 (four) hours as needed for anxiety.     magnesium oxide 400 MG tablet  Commonly known as:  MAG-OX  Take 1 tablet (400 mg total) by mouth every evening.     ondansetron 8 MG disintegrating tablet  Commonly known as:  ZOFRAN ODT  Take 1 tablet (8 mg total) by mouth every 8 (eight) hours as needed for nausea or vomiting.     ondansetron 8 MG tablet  Commonly known as:  ZOFRAN  Take by mouth every 8 (eight) hours as needed for nausea.     oxyCODONE 5 MG immediate release tablet  Commonly known as:  Oxy IR/ROXICODONE  Take 1 tablet (5 mg total) by mouth every 6 (six) hours as needed for moderate pain or severe pain.     OXYGEN  Inhale 2-3 L into the lungs as needed. For shortness of breath, respiratory distress or O2 sats <90.     POLYETHYLENE GLYCOL 3350 PO  Take 17 g by mouth daily as needed. For constipation. *Mix 1 capful in 6-8 ounces of beverage*     sevelamer carbonate 800 MG tablet  Commonly known as:  RENVELA  Take 800 mg by mouth 3 (three) times  daily before meals.     tiotropium 18 MCG inhalation capsule  Commonly known as:  SPIRIVA  Place 1 capsule (18 mcg total) into inhaler and inhale daily.          Diet and Activity recommendation: See Discharge Instructions above   Consults obtained -NEPHROLOGY   Major procedures and Radiology Reports - PLEASE review detailed and final reports for all details, in brief -  THORACOCENTESIS   Dg Chest 1 View  04/03/2016  CLINICAL DATA:  Post left-sided thoracentesis EXAM: CHEST 1 VIEW COMPARISON:  03/31/2016; 03/30/2016; chest CT - 04/02/2016 FINDINGS: Grossly unchanged enlarged cardiac silhouette and mediastinal contours with atherosclerotic plaque within the thoracic aorta. Interval reduction in persistent trace left-sided effusion post thoracentesis. No pneumothorax. Unchanged small right-sided effusion. Improved aeration of the left lung base with persistent bibasilar heterogeneous opacities, likely atelectasis. No new focal airspace opacities. Pulmonary venous congestion without frank evidence of edema. No acute osseus abnormalities. IMPRESSION: 1. Interval reduction in persistent trace left-sided effusion post thoracentesis. No pneumothorax. 2. Improved aeration of left lung base with persistent bibasilar opacities, right greater than left, likely atelectasis. 3. Unchanged trace right-sided effusion. 4. Pulmonary venous congestion without frank evidence of edema. 5.  Aortic Atherosclerosis (ICD10-170.0) Electronically Signed   By: Sandi Mariscal M.D.   On: 04/03/2016 11:38   Dg Chest 2 View  03/31/2016  CLINICAL DATA:  75 year old male with severe shortness of Breath. Possible pneumonia. Former smoker. Initial encounter. EXAM: CHEST  2 VIEW COMPARISON:  03/30/2016 and earlier. FINDINGS: Seated AP and lateral views of the chest. Dense left lower lobe opacification, suspicious for combination of consolidation and left pleural effusion. Blunting of the right costophrenic angle is largely chronic.  There is mild increased streaky opacity at the right lung base. No pneumothorax. No definite pulmonary edema. However, the cardiac silhouette does appear larger since March. Other mediastinal contours are stable. Visualized tracheal air column is within normal limits. No acute osseous abnormality identified. IMPRESSION: 1. Confluent abnormal left lower lobe opacity, nonspecific but favor pneumonia with left pleural effusion. Followup PA and lateral chest X-ray is recommended in 3-4 weeks following trial of antibiotic therapy to ensure  resolution and exclude underlying malignancy. 2. Cardiac silhouette appears increased since March. A Pericardial effusion is difficult to exclude. 3. Mildly increased streaky opacity at the right lung base, favor atelectasis. Electronically Signed   By: Genevie Ann M.D.   On: 03/31/2016 08:52   Ct Chest Wo Contrast  04/02/2016  CLINICAL DATA:  Worsening shortness of Breath, history of left lower lobe pneumonia EXAM: CT CHEST WITHOUT CONTRAST TECHNIQUE: Multidetector CT imaging of the chest was performed following the standard protocol without IV contrast. COMPARISON:  06/06/2015 FINDINGS: Cardiovascular: The thoracic aorta demonstrates diffuse calcification. Additionally there is dilatation of the descending aorta 5.2 cm. This is stable in appearance from the prior exam. Heavy coronary calcifications are noted. Mediastinum/Nodes: No significant hilar or mediastinal adenopathy is noted. Lungs/Pleura: Emphysematous changes are noted. Additionally there are loculated appearing pleural effusions bilaterally with only mild lower lobe atelectasis. The effusion is worse on the left. The changes on the right appear stable from the previous exam with the changes on the left increasing from the prior study. Upper Abdomen: Visualized upper abdomen shows no acute abnormality. The most superior aspect of the a aortic stent graft is seen. Musculoskeletal: Degenerative changes of the thoracic spine  are noted. IMPRESSION: Bilateral pleural effusions. The right-sided effusion is stable from the previous exam from September of 2016. The left-sided changes have increased in the interval from the prior study. Stable dilatation of the descending aorta. Bibasilar atelectatic changes increased on the left when compared with the prior study. Electronically Signed   By: Inez Catalina M.D.   On: 04/02/2016 19:08   Dg Chest Portable 1 View  03/30/2016  CLINICAL DATA:  Shortness of breath.  End-stage renal disease EXAM: PORTABLE CHEST 1 VIEW COMPARISON:  March 12, 2016 FINDINGS: There is persistent airspace consolidation in the left lower lobe with small left effusion. There is a small right effusion with subsegmental atelectasis on the right. There is mild underlying interstitial edema, stable. There is cardiomegaly with mild pulmonary venous hypertension. There is atherosclerotic calcification in the aorta. The aorta is mildly prominent but stable. No adenopathy evident. No bone lesions. IMPRESSION: There is pulmonary vascular congestion with congestive heart failure. Suspect superimposed pneumonia left lower lobe, although the opacity in the left lower lobe could be due to alveolar edema. There is aortic atherosclerosis. Aortic prominence is stable and likely reflective of chronic hypertension. There is no new opacity. No change in cardiac silhouette. Electronically Signed   By: Lowella Grip III M.D.   On: 03/30/2016 09:57   Dg Chest Port 1 View  03/12/2016  CLINICAL DATA:  Shortness of breath during dialysis. EXAM: PORTABLE CHEST 1 VIEW COMPARISON:  01/24/2016 FINDINGS: Cardiomediastinal silhouette is enlarged. Mediastinal contours appear intact. There is no evidence of pneumothorax. There is pulmonary vascular congestion. Bilateral pleural effusions are noted, the larger on the left. Left lower lobe airspace consolidation or segmental atelectasis cannot be excluded. Osseous structures are without acute  abnormality. Soft tissues are grossly normal. IMPRESSION: Enlarged cardiac silhouette with interstitial pulmonary edema and bilateral pleural effusions. Superimposed left lower lobe airspace consolidation or atelectasis cannot be excluded. Electronically Signed   By: Fidela Salisbury M.D.   On: 03/12/2016 11:39   US Thoracentesis Asp Pleural Space W/img Guide  04/03/2016  INDICATION: Recurrent admissions for CHF now with symptomatic bilateral pleural effusions. Please perform left-sided thoracentesis for therapeutic and diagnostic purposes. EXAM: US THORACENTESIS ASP PLEURAL SPACE W/IMG GUIDE COMPARISON:  03/31/2016; chest CT -04/02/2026 MEDICATIONS: None. COMPLICATIONS: None  immediate. TECHNIQUE: Informed written consent was obtained from the patient after a discussion of the risks, benefits and alternatives to treatment. A timeout was performed prior to the initiation of the procedure. Initial ultrasound scanning demonstrates a small left-sided pleural effusion. The lower chest was prepped and draped in the usual sterile fashion. 1% lidocaine was used for local anesthesia. An ultrasound image was saved for documentation purposes. An 8 Fr Safe-T-Centesis catheter was introduced. The thoracentesis was performed. The catheter was removed and a dressing was applied. The patient tolerated the procedure well without immediate post procedural complication. The patient was escorted to have an upright chest radiograph. FINDINGS: A total of approximately 0.5 liters of serous fluid was removed. Requested samples were sent to the laboratory. IMPRESSION: Successful ultrasound-guided left sided thoracentesis yielding 0.5 liters of pleural fluid. Electronically Signed   By: Sandi Mariscal M.D.   On: 04/03/2016 11:39    Micro Results     Recent Results (from the past 240 hour(s))  Body fluid culture     Status: None (Preliminary result)   Collection Time: 04/03/16 11:00 AM  Result Value Ref Range Status   Specimen  Description PLEURAL  Final   Special Requests NONE  Final   Gram Stain   Final    FEW WBC PRESENT, PREDOMINANTLY PMN NO ORGANISMS SEEN Performed at [facility]    Culture PENDING  Incomplete   Report Status PENDING  Incomplete       Today   Subjective:   Harrold Donath today has no SOB.FEELS BETTER.STABLE FOR DISCHARGE TO REHAB([facility])  Objective:   Blood pressure 108/68, pulse 66, temperature 97.8 F (36.6 C), temperature source Oral, resp. rate 19, height 6\' 2"  (1.88 m), weight 83.9 kg (184 lb 15.5 oz), SpO2 100 %.   Intake/Output Summary (Last 24 hours) at 04/04/16 0916 Last data filed at 04/03/16 1700  Gross per 24 hour  Intake    480 ml  Output      0 ml  Net    480 ml    Exam Awake Alert, Oriented x 3, No new F.N deficits, Normal affect [place].AT,PERRAL Supple Neck,No JVD, No cervical lymphadenopathy appriciated.  Symmetrical Chest wall movement, Good air movement bilaterally, CTAB RRR,No Gallops,Rubs or new Murmurs, No Parasternal Heave +ve B.Sounds, Abd Soft, Non tender, No organomegaly appriciated, No rebound -guarding or rigidity. No Cyanosis, Clubbing or edema, No new Rash or bruise  Data Review   CBC w Diff: Lab Results  Component Value Date   WBC 6.7 04/03/2016   WBC 11.7* 01/20/2015   HGB 7.8* 04/03/2016   HGB 6.9* 01/20/2015   HCT 24.5* 04/03/2016   HCT 20.0* 06/08/2015   HCT 21.5* 01/20/2015   PLT 171 04/03/2016   PLT 176 01/20/2015   LYMPHOPCT 7 03/30/2016   LYMPHOPCT 9.0 01/20/2015   MONOPCT 5 03/30/2016   MONOPCT 7.8 01/20/2015   EOSPCT 0 03/30/2016   EOSPCT 1.7 01/20/2015   BASOPCT 0 03/30/2016   BASOPCT 0.2 01/20/2015    CMP: Lab Results  Component Value Date   NA 138 04/02/2016   NA 141 01/20/2015   K 5.1 04/02/2016   K 3.6 01/20/2015   CL 98* 04/02/2016   CL 99* 01/20/2015   CO2 28 04/02/2016   CO2 30 01/20/2015   BUN 72* 04/02/2016   BUN 32* 01/20/2015   CREATININE 6.26* 04/02/2016   CREATININE 6.73*  01/20/2015   PROT 5.5* 03/31/2016   PROT 6.5 07/04/2014   ALBUMIN 2.4* 04/02/2016  ALBUMIN 2.4* 07/06/2014   BILITOT 0.9 03/31/2016   BILITOT 0.3 07/04/2014   ALKPHOS 188* 03/31/2016   ALKPHOS 139* 07/04/2014   AST 13* 03/31/2016   AST 19 07/04/2014   ALT 9* 03/31/2016   ALT 10* 07/04/2014  .   Total Time in preparing paper work, data evaluation and todays exam - 50 minutes  Shantae Vantol M.D on 04/04/2016 at 9:16 AM    Note: This dictation was prepared with Dragon dictation along with smaller phrase technology. Any transcriptional errors that result from this process are unintentional.

## 2016-04-04 NOTE — Progress Notes (Signed)
Hemodialysis completed. 

## 2016-04-04 NOTE — Progress Notes (Signed)
Pt prepared for d/c to [facility]. IV and central telemetry d/c'd. Skin intact except as charted in most recent assessments. Vitals are stable. Report called to receiving facility, cousin [name] was called and made aware of pt.'s discharge. Pt to be transported by ambulance service.  [name]

## 2016-04-04 NOTE — Clinical Social Work Note (Signed)
Patient to discharge as expected today to [facility]. He will be able to discharge after he completes dialysis. Doug at [facility] aware. Discharge information sent. Patient to transport via EMS. [name] MSW,LCSW [phone]

## 2016-04-04 NOTE — Progress Notes (Signed)
Post hd tx 

## 2016-04-04 NOTE — Progress Notes (Signed)
[facility]  ROUNDING NOTE   Subjective:   Seen and examined on hemodialysis. Tolerating treatment. UF goal 2 litres  Objective:  Vital signs in last 24 hours:  Temp:  [97.4 F (36.3 C)-97.8 F (36.6 C)] 97.8 F (36.6 C) (07/13 0940) Pulse Rate:  [63-84] 69 (07/13 1015) Resp:  [18-27] 27 (07/13 1015) BP: (107-120)/(59-78) 113/69 mmHg (07/13 1015) SpO2:  [91 %-100 %] 100 % (07/13 1015) Weight:  [83.9 kg (184 lb 15.5 oz)] 83.9 kg (184 lb 15.5 oz) (07/13 0940)  Weight change: -2.1 kg (-4 lb 10.1 oz) Filed Weights   04/03/16 0500 04/04/16 0443 04/04/16 0940  Weight: 83.1 kg (183 lb 3.2 oz) 83.9 kg (184 lb 15.5 oz) 83.9 kg (184 lb 15.5 oz)    Intake/Output: I/O last 3 completed shifts: In: 35 [P.O.:820] Out: 0    Intake/Output this shift:     Physical Exam: General: NAD, sitting in chair  Head: Normocephalic, atraumatic. Moist oral mucosal membranes  Eyes: Anicteric  Neck: Supple, trachea midline  Lungs:  Clear   Heart: S1S2 no rubs  Abdomen:  Soft, nontender, BS present   Extremities: no peripheral edema.  Neurologic: Nonfocal, moving all four extremities  Skin: No lesions  Access: LUE AVF    Basic Metabolic Panel:  Recent Labs Lab 03/30/16 0935 03/30/16 1800 03/31/16 0557 04/02/16 0950  NA 139  --  141 138  K 3.6  --  3.5 5.1  CL 98*  --  102 98*  CO2 32  --  33* 28  GLUCOSE 116*  --  118* 123*  BUN 46*  --  33* 72*  CREATININE 4.06*  --  3.52* 6.26*  CALCIUM 8.4*  --  8.3* 8.3*  PHOS  --  1.8*  --  4.7*    Liver Function Tests:  Recent Labs Lab 03/30/16 0935 03/31/16 0557 04/02/16 0950  AST 16 13*  --   ALT 12* 9*  --   ALKPHOS 234* 188*  --   BILITOT 1.2 0.9  --   PROT 6.0* 5.5*  --   ALBUMIN 2.7* 2.4* 2.4*   No results for input(s): LIPASE, AMYLASE in the last 168 hours. No results for input(s): AMMONIA in the last 168 hours.  CBC:  Recent Labs Lab 03/30/16 0935 03/31/16 0557 04/02/16 0950 04/03/16 0540  WBC  13.2* 7.0 7.9 6.7  NEUTROABS 11.6*  --   --   --   HGB 8.7* 8.2* 7.7* 7.8*  HCT 26.9* 25.4* 23.8* 24.5*  MCV 86.1 86.9 85.5 86.6  PLT 189 157 223 171    Cardiac Enzymes:  Recent Labs Lab 03/30/16 0935 03/30/16 1153 03/30/16 1800 03/30/16 2343 03/31/16 0557  TROPONINI 0.03* <0.03 0.04* <0.03 0.03*    BNP: Invalid input(s): POCBNP  CBG:  Recent Labs Lab 03/31/16 0727 04/02/16 0757 04/03/16 0722 04/04/16 0742  GLUCAP 101* 116* 114* 115*    Microbiology: Results for orders placed or performed during the hospital encounter of 03/30/16  Body fluid culture     Status: None (Preliminary result)   Collection Time: 04/03/16 11:00 AM  Result Value Ref Range Status   Specimen Description PLEURAL  Final   Special Requests NONE  Final   Gram Stain   Final    FEW WBC PRESENT, PREDOMINANTLY PMN NO ORGANISMS SEEN Performed at [facility]    Culture PENDING  Incomplete   Report Status PENDING  Incomplete    Coagulation Studies: No results for input(s): LABPROT, INR in  the last 72 hours.  Urinalysis: No results for input(s): COLORURINE, LABSPEC, PHURINE, GLUCOSEU, HGBUR, BILIRUBINUR, KETONESUR, PROTEINUR, UROBILINOGEN, NITRITE, LEUKOCYTESUR in the last 72 hours.  Invalid input(s): APPERANCEUR    Imaging: Dg Chest 1 View  04/03/2016  CLINICAL DATA:  Post left-sided thoracentesis EXAM: CHEST 1 VIEW COMPARISON:  03/31/2016; 03/30/2016; chest CT - 04/02/2016 FINDINGS: Grossly unchanged enlarged cardiac silhouette and mediastinal contours with atherosclerotic plaque within the thoracic aorta. Interval reduction in persistent trace left-sided effusion post thoracentesis. No pneumothorax. Unchanged small right-sided effusion. Improved aeration of the left lung base with persistent bibasilar heterogeneous opacities, likely atelectasis. No new focal airspace opacities. Pulmonary venous congestion without frank evidence of edema. No acute osseus abnormalities. IMPRESSION: 1.  Interval reduction in persistent trace left-sided effusion post thoracentesis. No pneumothorax. 2. Improved aeration of left lung base with persistent bibasilar opacities, right greater than left, likely atelectasis. 3. Unchanged trace right-sided effusion. 4. Pulmonary venous congestion without frank evidence of edema. 5.  Aortic Atherosclerosis (ICD10-170.0) Electronically Signed   By: Sandi Mariscal M.D.   On: 04/03/2016 11:38   Ct Chest Wo Contrast  04/02/2016  CLINICAL DATA:  Worsening shortness of Breath, history of left lower lobe pneumonia EXAM: CT CHEST WITHOUT CONTRAST TECHNIQUE: Multidetector CT imaging of the chest was performed following the standard protocol without IV contrast. COMPARISON:  06/06/2015 FINDINGS: Cardiovascular: The thoracic aorta demonstrates diffuse calcification. Additionally there is dilatation of the descending aorta 5.2 cm. This is stable in appearance from the prior exam. Heavy coronary calcifications are noted. Mediastinum/Nodes: No significant hilar or mediastinal adenopathy is noted. Lungs/Pleura: Emphysematous changes are noted. Additionally there are loculated appearing pleural effusions bilaterally with only mild lower lobe atelectasis. The effusion is worse on the left. The changes on the right appear stable from the previous exam with the changes on the left increasing from the prior study. Upper Abdomen: Visualized upper abdomen shows no acute abnormality. The most superior aspect of the a aortic stent graft is seen. Musculoskeletal: Degenerative changes of the thoracic spine are noted. IMPRESSION: Bilateral pleural effusions. The right-sided effusion is stable from the previous exam from September of 2016. The left-sided changes have increased in the interval from the prior study. Stable dilatation of the descending aorta. Bibasilar atelectatic changes increased on the left when compared with the prior study. Electronically Signed   By: Inez Catalina M.D.   On:  04/02/2016 19:08   US Thoracentesis Asp Pleural Space W/img Guide  04/03/2016  INDICATION: Recurrent admissions for CHF now with symptomatic bilateral pleural effusions. Please perform left-sided thoracentesis for therapeutic and diagnostic purposes. EXAM: US THORACENTESIS ASP PLEURAL SPACE W/IMG GUIDE COMPARISON:  03/31/2016; chest CT -04/02/2026 MEDICATIONS: None. COMPLICATIONS: None immediate. TECHNIQUE: Informed written consent was obtained from the patient after a discussion of the risks, benefits and alternatives to treatment. A timeout was performed prior to the initiation of the procedure. Initial ultrasound scanning demonstrates a small left-sided pleural effusion. The lower chest was prepped and draped in the usual sterile fashion. 1% lidocaine was used for local anesthesia. An ultrasound image was saved for documentation purposes. An 8 Fr Safe-T-Centesis catheter was introduced. The thoracentesis was performed. The catheter was removed and a dressing was applied. The patient tolerated the procedure well without immediate post procedural complication. The patient was escorted to have an upright chest radiograph. FINDINGS: A total of approximately 0.5 liters of serous fluid was removed. Requested samples were sent to the laboratory. IMPRESSION: Successful ultrasound-guided left sided thoracentesis yielding 0.5 liters  of pleural fluid. Electronically Signed   By: Sandi Mariscal M.D.   On: 04/03/2016 11:39     Medications:     . amiodarone  200 mg Oral Daily  . atorvastatin  40 mg Oral QHS  . carvedilol  3.125 mg Oral BID WC  . famotidine  20 mg Oral Q48H  . ferrous sulfate  325 mg Oral Daily  . guaiFENesin  600 mg Oral BID  . heparin  5,000 Units Subcutaneous Q8H  . ipratropium-albuterol  3 mL Nebulization QID  . lamoTRIgine  25 mg Oral QHS  . [START ON 04/05/2016] lamoTRIgine  50 mg Oral QHS  . magnesium oxide  400 mg Oral QPM  . mometasone-formoterol  2 puff Inhalation BID  .  nitroGLYCERIN  0.5 inch Topical Q8H  . senna-docusate  1 tablet Oral BID  . sevelamer carbonate  800 mg Oral TID AC  . tiotropium  18 mcg Inhalation Daily   acetaminophen **OR** acetaminophen, albuterol, bisacodyl, LORazepam, ondansetron **OR** ondansetron (ZOFRAN) IV, oxyCODONE  Assessment/ Plan:  75 y.o. 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   1. ESRD on HD TTS: seen and examined on dialysis treatment . Tolerating treatment.   2. Anemia of CKD:  - Continue Epogen with dialysis and also iron supplements.  3. Secondary Hyperparathyroidism:  - Continue Renvela 800 mg by mouth 3 times a day.  4. Hypertension:  Well controlled - carvedilol.    LOS: [place], [place] 7/13/201710:25 AM

## 2016-04-04 NOTE — Progress Notes (Signed)
Pre-hd tx 

## 2016-04-04 NOTE — Progress Notes (Signed)
Hemodialysis started.Patient dialyzing in reclining chair.

## 2016-04-07 LAB — BODY FLUID CULTURE: CULTURE: NO GROWTH

## 2016-04-12 ENCOUNTER — Ambulatory Visit: Payer: Medicare Other | Admitting: Family

## 2016-04-19 ENCOUNTER — Ambulatory Visit: Payer: Medicare Other | Attending: Family | Admitting: Family

## 2016-04-19 ENCOUNTER — Encounter: Payer: Self-pay | Admitting: Family

## 2016-04-19 VITALS — BP 108/64 | HR 78 | Resp 20 | Ht 74.0 in

## 2016-04-19 DIAGNOSIS — E7800 Pure hypercholesterolemia, unspecified: Secondary | ICD-10-CM | POA: Insufficient documentation

## 2016-04-19 DIAGNOSIS — R5383 Other fatigue: Secondary | ICD-10-CM | POA: Insufficient documentation

## 2016-04-19 DIAGNOSIS — Z9981 Dependence on supplemental oxygen: Secondary | ICD-10-CM | POA: Insufficient documentation

## 2016-04-19 DIAGNOSIS — E612 Magnesium deficiency: Secondary | ICD-10-CM | POA: Diagnosis not present

## 2016-04-19 DIAGNOSIS — Z960 Presence of urogenital implants: Secondary | ICD-10-CM | POA: Diagnosis not present

## 2016-04-19 DIAGNOSIS — N3281 Overactive bladder: Secondary | ICD-10-CM | POA: Insufficient documentation

## 2016-04-19 DIAGNOSIS — Z87891 Personal history of nicotine dependence: Secondary | ICD-10-CM | POA: Insufficient documentation

## 2016-04-19 DIAGNOSIS — I639 Cerebral infarction, unspecified: Secondary | ICD-10-CM | POA: Insufficient documentation

## 2016-04-19 DIAGNOSIS — Z880 Allergy status to penicillin: Secondary | ICD-10-CM | POA: Insufficient documentation

## 2016-04-19 DIAGNOSIS — F0390 Unspecified dementia without behavioral disturbance: Secondary | ICD-10-CM | POA: Insufficient documentation

## 2016-04-19 DIAGNOSIS — N186 End stage renal disease: Secondary | ICD-10-CM | POA: Insufficient documentation

## 2016-04-19 DIAGNOSIS — J449 Chronic obstructive pulmonary disease, unspecified: Secondary | ICD-10-CM | POA: Diagnosis not present

## 2016-04-19 DIAGNOSIS — H539 Unspecified visual disturbance: Secondary | ICD-10-CM | POA: Insufficient documentation

## 2016-04-19 DIAGNOSIS — D649 Anemia, unspecified: Secondary | ICD-10-CM | POA: Diagnosis not present

## 2016-04-19 DIAGNOSIS — K219 Gastro-esophageal reflux disease without esophagitis: Secondary | ICD-10-CM | POA: Insufficient documentation

## 2016-04-19 DIAGNOSIS — R32 Unspecified urinary incontinence: Secondary | ICD-10-CM | POA: Insufficient documentation

## 2016-04-19 DIAGNOSIS — Z8546 Personal history of malignant neoplasm of prostate: Secondary | ICD-10-CM | POA: Insufficient documentation

## 2016-04-19 DIAGNOSIS — I723 Aneurysm of iliac artery: Secondary | ICD-10-CM | POA: Insufficient documentation

## 2016-04-19 DIAGNOSIS — N1330 Unspecified hydronephrosis: Secondary | ICD-10-CM | POA: Insufficient documentation

## 2016-04-19 DIAGNOSIS — R05 Cough: Secondary | ICD-10-CM | POA: Diagnosis not present

## 2016-04-19 DIAGNOSIS — I480 Paroxysmal atrial fibrillation: Secondary | ICD-10-CM | POA: Insufficient documentation

## 2016-04-19 DIAGNOSIS — Z823 Family history of stroke: Secondary | ICD-10-CM | POA: Insufficient documentation

## 2016-04-19 DIAGNOSIS — I5032 Chronic diastolic (congestive) heart failure: Secondary | ICD-10-CM | POA: Insufficient documentation

## 2016-04-19 DIAGNOSIS — I132 Hypertensive heart and chronic kidney disease with heart failure and with stage 5 chronic kidney disease, or end stage renal disease: Secondary | ICD-10-CM | POA: Insufficient documentation

## 2016-04-19 DIAGNOSIS — Z8619 Personal history of other infectious and parasitic diseases: Secondary | ICD-10-CM | POA: Insufficient documentation

## 2016-04-19 DIAGNOSIS — Z8719 Personal history of other diseases of the digestive system: Secondary | ICD-10-CM | POA: Insufficient documentation

## 2016-04-19 DIAGNOSIS — Z992 Dependence on renal dialysis: Secondary | ICD-10-CM | POA: Insufficient documentation

## 2016-04-19 DIAGNOSIS — Z8551 Personal history of malignant neoplasm of bladder: Secondary | ICD-10-CM | POA: Insufficient documentation

## 2016-04-19 DIAGNOSIS — I4819 Other persistent atrial fibrillation: Secondary | ICD-10-CM

## 2016-04-19 DIAGNOSIS — R0602 Shortness of breath: Secondary | ICD-10-CM | POA: Insufficient documentation

## 2016-04-19 DIAGNOSIS — I10 Essential (primary) hypertension: Secondary | ICD-10-CM

## 2016-04-19 NOTE — Patient Instructions (Signed)
Continue not using salt

## 2016-04-19 NOTE — Progress Notes (Signed)
Subjective:    Patient ID: [name], male    DOB: 10/07/1940, 75 y.o.   MRN: [id number]  Congestive Heart Failure  Presents for follow-up visit. Associated symptoms include fatigue and shortness of breath. Pertinent negatives include no abdominal pain, chest pain, edema, orthopnea or palpitations. The symptoms have been stable. Compliance with total regimen is 0-25%. Compliance problems include adherence to exercise.  Compliance with exercise is 0-25% (unable to bear weight on his legs).  Hypertension  This is a chronic problem. The current episode started more than 1 year ago. The problem is unchanged. The problem is controlled. Associated symptoms include shortness of breath. Pertinent negatives include no chest pain, headaches, neck pain, palpitations or peripheral edema. There are no associated agents to hypertension. Risk factors for coronary artery disease include family history, male gender, smoking/tobacco exposure and sedentary lifestyle. Past treatments include diuretics, lifestyle changes and beta blockers. The current treatment provides moderate improvement. Compliance problems include exercise.  Hypertensive end-organ damage includes kidney disease, CAD/MI and heart failure.    Past Medical History:  Diagnosis Date  . Allergic rhinitis   . Anemia    a. baseline hgb ~ 8  . Bladder cancer ([place])   . Chronic combined systolic and diastolic CHF (congestive heart failure) ([place])    a. echo 12/2014: EF 25-30%, anterior wall wall motion abnormalities; b. planned ischemic evaluation once patient is stable medically; c. echo 01/2015: EF 45-50%, no RWMA, mild AT, LA mildly dilated, mod pericardial effusion along LV free wall, no evidence of hemodynamic compromise  . Cognitive communication deficit   . COPD (chronic obstructive pulmonary disease) ([place])   . Dementia   . Detrusor sphincter dyssynergia   . ESRD on hemodialysis ([place])    a. Tuesday, Thursday, and Saturdays  . GERD  (gastroesophageal reflux disease)   . History of septic shock    a. 01/2015  . History of small bowel obstruction    a. 01/2015  . Hydronephrosis   . Hypercholesteremia   . Hypertension   . 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 Incontinence   . Magnesium deficiency   . Mini stroke ([place])   . Overactive bladder   . PAF (paroxysmal atrial fibrillation) ([place])    a. new onset 12/2014 in the setting of UTI, sepsis, hypotension, and anemia; b. not on long term anticoagulation given anemia; c. family aware of stroke risk, they are ok with this; d. on amiodarone   . Prostate cancer [facility])     Past Surgical History:  Procedure Laterality Date  . AV FISTULA PLACEMENT     left arm  . CYSTOSCOPY W/ URETERAL STENT PLACEMENT    . PERIPHERAL VASCULAR CATHETERIZATION N/A 01/24/2016   Procedure: Endovascular Repair/Stent Graft;  Surgeon: Katha Cabal, MD;  Location: [place] [facility];  Service: Cardiovascular;  Laterality: N/A;  . PROSTATE SURGERY     Removal  . TRANSURETHRAL RESECTION OF BLADDER TUMOR WITH GYRUS (TURBT-GYRUS)    . URETERAL STENT PLACEMENT      Family History  Problem Relation Age of Onset  . Stroke Mother     Social History  Substance Use Topics  . Smoking status: Former Smoker    Packs/day: 0.25    Years: 56.00    Quit date: 05/13/2015  . Smokeless tobacco: Never Used  . Alcohol use No    Allergies  Allergen Reactions  . Penicillins Hives, Itching and Swelling    Has patient had a PCN reaction causing immediate rash,  facial/tongue/throat swelling, SOB or lightheadedness with hypotension: Yes Has patient had a PCN reaction causing severe rash involving mucus membranes or skin necrosis: No Has patient had a PCN reaction that required hospitalization No Has patient had a PCN reaction occurring within the last 10 years: No If all of the above answers are "NO", then may proceed with Cephalosporin use.    Prior to Admission medications   Medication Sig Start Date End Date  Taking? Authorizing Provider  acetaminophen (TYLENOL) 325 MG tablet Take 650 mg by mouth every 8 (eight) hours as needed. For general discomfort   Yes Historical Provider, MD  albuterol (PROVENTIL) (2.5 MG/3ML) 0.083% nebulizer solution Take 3 mLs (2.5 mg total) by nebulization every 4 (four) hours as needed for wheezing or shortness of breath. 06/10/15  Yes Fritzi Mandes, MD  amiodarone (PACERONE) 200 MG tablet Take 1 tablet (200 mg total) by mouth daily. 09/13/15  Yes Gladstone Lighter, MD  atorvastatin (LIPITOR) 40 MG tablet Take 40 mg by mouth at bedtime.   Yes Historical Provider, MD  budesonide-formoterol (SYMBICORT) 160-4.5 MCG/ACT inhaler Inhale 2 puffs into the lungs 2 (two) times daily.   Yes Historical Provider, MD  carvedilol (COREG) 3.125 MG tablet Take 1 tablet (3.125 mg total) by mouth 2 (two) times daily with a meal. 03/14/16  Yes Gladstone Lighter, MD  ferrous sulfate 325 (65 FE) MG tablet Take 325 mg by mouth daily.   Yes Historical Provider, MD  guaiFENesin (MUCINEX) 600 MG 12 hr tablet Take 600 mg by mouth 2 (two) times daily. (Take every morning and at bedtime for cough)   Yes Historical Provider, MD  guaifenesin (ROBITUSSIN) 100 MG/5ML syrup Take 200 mg by mouth every 4 (four) hours as needed for cough.   Yes Historical Provider, MD  ipratropium-albuterol (DUONEB) 0.5-2.5 (3) MG/3ML SOLN Take 3 mLs by nebulization 3 (three) times daily.   Yes Historical Provider, MD  lamoTRIgine (LAMICTAL) 25 MG tablet Take 50 mg by mouth at bedtime. Start on July 14th. 04/05/16  Yes Historical Provider, MD  LORazepam (ATIVAN) 0.5 MG tablet Take 1 tablet (0.5 mg total) by mouth every 4 (four) hours as needed for anxiety. 04/04/16  Yes Epifanio Lesches, MD  magnesium oxide (MAG-OX) 400 MG tablet Take 1 tablet (400 mg total) by mouth every evening. 09/13/15  Yes Gladstone Lighter, MD  ondansetron (ZOFRAN) 8 MG tablet Take by mouth every 8 (eight) hours as needed for nausea.   Yes Historical Provider, MD   oxyCODONE (OXY IR/ROXICODONE) 5 MG immediate release tablet Take 1 tablet (5 mg total) by mouth every 6 (six) hours as needed for moderate pain or severe pain. 04/04/16  Yes Epifanio Lesches, MD  OXYGEN Inhale 2-3 L into the lungs as needed. For shortness of breath, respiratory distress or O2 sats <90.   Yes Historical Provider, MD  POLYETHYLENE GLYCOL 3350 PO Take 17 g by mouth daily as needed. For constipation. *Mix 1 capful in 6-8 ounces of beverage*   Yes Historical Provider, MD  sevelamer carbonate (RENVELA) 800 MG tablet Take 800 mg by mouth 3 (three) times daily before meals.    Yes Historical Provider, MD  tiotropium (SPIRIVA) 18 MCG inhalation capsule Place 1 capsule (18 mcg total) into inhaler and inhale daily. 09/13/15  Yes Gladstone Lighter, MD            Review of Systems  Constitutional: Positive for fatigue. Negative for appetite change.  HENT: Positive for rhinorrhea. Negative for congestion and sore throat.  Eyes: Positive for visual disturbance (blurry vision). Negative for pain.  Respiratory: Positive for cough and shortness of breath. Negative for chest tightness.   Cardiovascular: Negative for chest pain, palpitations and leg swelling.  Gastrointestinal: Negative for abdominal distention and abdominal pain.  Endocrine: Negative.   Genitourinary: Negative.   Musculoskeletal: Negative for back pain and neck pain.  Skin: Negative.   Allergic/Immunologic: Negative.   Neurological: Negative for dizziness, light-headedness and headaches.  Hematological: Negative for adenopathy. Does not bruise/bleed easily.  Psychiatric/Behavioral: Positive for dysphoric mood and sleep disturbance (interrupted sleep; oxygen at 2L with 1 pillow under the head). The patient is not nervous/anxious.        Objective:   Physical Exam  Constitutional: He is oriented to person, place, and time. He appears well-developed and well-nourished.  HENT:  Head: Normocephalic and atraumatic.   Eyes: Conjunctivae are normal. Pupils are equal, round, and reactive to light.  Neck: Normal range of motion. Neck supple.  Cardiovascular: Normal rate and regular rhythm.   Pulmonary/Chest: Effort normal. He has no wheezes. He has no rales.  Abdominal: Soft. He exhibits no distension. There is no tenderness.  Musculoskeletal: He exhibits no edema or tenderness.  Neurological: He is alert and oriented to person, place, and time.  Skin: Skin is warm and dry.  Psychiatric: He has a normal mood and affect. His behavior is normal. Thought content normal.  Nursing note and vitals reviewed.   BP 108/64   Pulse 78   Resp 20   Ht 6\' 2"  (1.88 m)   SpO2 98% Comment: on 2L       Assessment & Plan:  1: Chronic heart failure with preserved ejection fraction- Patient presents with fatigue and shortness of breath with minimal exertion (Class III). Denies having any symptoms at rest. He is not getting weighed because he is unable to weight bear on his legs. He said that he had 2 physical therapy sessions last week and then was told that he was released but he's not sure why. His cousin that is with him and is his HCPOA says that she's going to speak with [facility] to see why he was released from PT. He is not adding any salt to his food and says that he's given a low sodium diet at the facility. Medication list was not brought with him so had to call the facility after patient left to get the medication list. 2: HTN- Blood pressure looks good today.  3: Atrial fibrillation- Currently rate controlled at this time with amiodarone and carvedilol. 4: ESRD on hemodialysis- He's currently going to dialysis on T, TH, Sat.  Will update medication list once it's received from the facility.  Return here in 3 months or sooner for any questions/problems before then.

## 2016-04-24 LAB — CULTURE, FUNGUS WITHOUT SMEAR

## 2016-05-06 ENCOUNTER — Other Ambulatory Visit: Payer: Self-pay

## 2016-05-06 ENCOUNTER — Emergency Department: Payer: Medicare Other

## 2016-05-06 ENCOUNTER — Encounter: Payer: Self-pay | Admitting: Emergency Medicine

## 2016-05-06 ENCOUNTER — Inpatient Hospital Stay
Admission: EM | Admit: 2016-05-06 | Discharge: 2016-05-09 | DRG: 291 | Disposition: A | Discharge status: Skilled Nursing Facility | Payer: Medicare Other | Attending: Specialist | Admitting: Specialist

## 2016-05-06 DIAGNOSIS — J9601 Acute respiratory failure with hypoxia: Secondary | ICD-10-CM | POA: Diagnosis present

## 2016-05-06 DIAGNOSIS — R5383 Other fatigue: Secondary | ICD-10-CM

## 2016-05-06 DIAGNOSIS — Z992 Dependence on renal dialysis: Secondary | ICD-10-CM | POA: Diagnosis not present

## 2016-05-06 DIAGNOSIS — Z880 Allergy status to penicillin: Secondary | ICD-10-CM

## 2016-05-06 DIAGNOSIS — Z79899 Other long term (current) drug therapy: Secondary | ICD-10-CM

## 2016-05-06 DIAGNOSIS — Z8673 Personal history of transient ischemic attack (TIA), and cerebral infarction without residual deficits: Secondary | ICD-10-CM | POA: Diagnosis not present

## 2016-05-06 DIAGNOSIS — J9621 Acute and chronic respiratory failure with hypoxia: Secondary | ICD-10-CM | POA: Diagnosis present

## 2016-05-06 DIAGNOSIS — J441 Chronic obstructive pulmonary disease with (acute) exacerbation: Secondary | ICD-10-CM | POA: Diagnosis present

## 2016-05-06 DIAGNOSIS — I5043 Acute on chronic combined systolic (congestive) and diastolic (congestive) heart failure: Secondary | ICD-10-CM | POA: Diagnosis present

## 2016-05-06 DIAGNOSIS — Z7951 Long term (current) use of inhaled steroids: Secondary | ICD-10-CM | POA: Diagnosis not present

## 2016-05-06 DIAGNOSIS — H409 Unspecified glaucoma: Secondary | ICD-10-CM | POA: Diagnosis present

## 2016-05-06 DIAGNOSIS — K219 Gastro-esophageal reflux disease without esophagitis: Secondary | ICD-10-CM | POA: Diagnosis present

## 2016-05-06 DIAGNOSIS — Z8546 Personal history of malignant neoplasm of prostate: Secondary | ICD-10-CM | POA: Diagnosis not present

## 2016-05-06 DIAGNOSIS — I132 Hypertensive heart and chronic kidney disease with heart failure and with stage 5 chronic kidney disease, or end stage renal disease: Principal | ICD-10-CM | POA: Diagnosis present

## 2016-05-06 DIAGNOSIS — Z9981 Dependence on supplemental oxygen: Secondary | ICD-10-CM | POA: Diagnosis not present

## 2016-05-06 DIAGNOSIS — J9600 Acute respiratory failure, unspecified whether with hypoxia or hypercapnia: Secondary | ICD-10-CM | POA: Diagnosis present

## 2016-05-06 DIAGNOSIS — N2581 Secondary hyperparathyroidism of renal origin: Secondary | ICD-10-CM | POA: Diagnosis present

## 2016-05-06 DIAGNOSIS — Z87891 Personal history of nicotine dependence: Secondary | ICD-10-CM

## 2016-05-06 DIAGNOSIS — E8779 Other fluid overload: Secondary | ICD-10-CM | POA: Diagnosis not present

## 2016-05-06 DIAGNOSIS — I480 Paroxysmal atrial fibrillation: Secondary | ICD-10-CM | POA: Diagnosis present

## 2016-05-06 DIAGNOSIS — D631 Anemia in chronic kidney disease: Secondary | ICD-10-CM | POA: Diagnosis present

## 2016-05-06 DIAGNOSIS — N186 End stage renal disease: Secondary | ICD-10-CM | POA: Diagnosis present

## 2016-05-06 DIAGNOSIS — R400 Somnolence: Secondary | ICD-10-CM

## 2016-05-06 DIAGNOSIS — J432 Centrilobular emphysema: Secondary | ICD-10-CM | POA: Diagnosis not present

## 2016-05-06 DIAGNOSIS — Z8551 Personal history of malignant neoplasm of bladder: Secondary | ICD-10-CM | POA: Diagnosis not present

## 2016-05-06 DIAGNOSIS — I509 Heart failure, unspecified: Secondary | ICD-10-CM

## 2016-05-06 LAB — BLOOD GAS, ARTERIAL
Acid-Base Excess: 4.1 mmol/L — ABNORMAL HIGH (ref 0.0–3.0)
Allens test (pass/fail): POSITIVE — AB
Bicarbonate: 28.5 meq/L — ABNORMAL HIGH (ref 21.0–28.0)
Delivery systems: POSITIVE
Expiratory PAP: 6
FIO2: 0.7
Inspiratory PAP: 12
O2 Saturation: 99.7 %
Patient temperature: 37
pCO2 arterial: 41 mmHg (ref 32.0–48.0)
pH, Arterial: 7.45 (ref 7.350–7.450)
pO2, Arterial: 192 mmHg — ABNORMAL HIGH (ref 83.0–108.0)

## 2016-05-06 LAB — CBC WITH DIFFERENTIAL/PLATELET
BASOS ABS: 0 10*3/uL (ref 0–0.1)
BASOS PCT: 1 %
Eosinophils Absolute: 0.2 10*3/uL (ref 0–0.7)
Eosinophils Relative: 2 %
HEMATOCRIT: 29.5 % — AB (ref 40.0–52.0)
HEMOGLOBIN: 9.4 g/dL — AB (ref 13.0–18.0)
Lymphocytes Relative: 6 %
Lymphs Abs: 0.6 10*3/uL — ABNORMAL LOW (ref 1.0–3.6)
MCH: 27.2 pg (ref 26.0–34.0)
MCHC: 31.7 g/dL — ABNORMAL LOW (ref 32.0–36.0)
MCV: 85.8 fL (ref 80.0–100.0)
MONO ABS: 0.7 10*3/uL (ref 0.2–1.0)
Monocytes Relative: 7 %
NEUTROS ABS: 8.6 10*3/uL — AB (ref 1.4–6.5)
NEUTROS PCT: 84 %
Platelets: 224 10*3/uL (ref 150–440)
RBC: 3.44 MIL/uL — ABNORMAL LOW (ref 4.40–5.90)
RDW: 17.9 % — AB (ref 11.5–14.5)
WBC: 10.2 10*3/uL (ref 3.8–10.6)

## 2016-05-06 LAB — BASIC METABOLIC PANEL WITH GFR
Anion gap: 11 (ref 5–15)
BUN: 64 mg/dL — ABNORMAL HIGH (ref 6–20)
CO2: 29 mmol/L (ref 22–32)
Calcium: 8.5 mg/dL — ABNORMAL LOW (ref 8.9–10.3)
Chloride: 99 mmol/L — ABNORMAL LOW (ref 101–111)
Creatinine, Ser: 5.98 mg/dL — ABNORMAL HIGH (ref 0.61–1.24)
GFR calc Af Amer: 10 mL/min — ABNORMAL LOW
GFR calc non Af Amer: 8 mL/min — ABNORMAL LOW
Glucose, Bld: 118 mg/dL — ABNORMAL HIGH (ref 65–99)
Potassium: 4.2 mmol/L (ref 3.5–5.1)
Sodium: 139 mmol/L (ref 135–145)

## 2016-05-06 LAB — TROPONIN I
TROPONIN I: 0.03 ng/mL — AB (ref <0.03)
Troponin I: 0.03 ng/mL (ref <0.03)

## 2016-05-06 LAB — BRAIN NATRIURETIC PEPTIDE: B Natriuretic Peptide: >4500 pg/mL — ABNORMAL HIGH (ref 0.0–100.0)

## 2016-05-06 MED ORDER — AMIODARONE HCL 200 MG PO TABS
200.0000 mg | ORAL_TABLET | Freq: Every day | ORAL | Status: DC
  Administered 2016-05-07 – 2016-05-09 (×3): 200 mg via ORAL
  Filled 2016-05-06 (×4): qty 1

## 2016-05-06 MED ORDER — IPRATROPIUM-ALBUTEROL 0.5-2.5 (3) MG/3ML IN SOLN
3.0000 mL | Freq: Once | RESPIRATORY_TRACT | Status: AC
  Administered 2016-05-06: 3 mL via RESPIRATORY_TRACT
  Filled 2016-05-06: qty 3

## 2016-05-06 MED ORDER — NITROGLYCERIN 0.4 MG SL SUBL
SUBLINGUAL_TABLET | SUBLINGUAL | Status: AC
  Administered 2016-05-06: 0.4 mg via SUBLINGUAL
  Filled 2016-05-06: qty 1

## 2016-05-06 MED ORDER — IPRATROPIUM-ALBUTEROL 0.5-2.5 (3) MG/3ML IN SOLN
3.0000 mL | RESPIRATORY_TRACT | Status: DC
  Administered 2016-05-07 – 2016-05-08 (×8): 3 mL via RESPIRATORY_TRACT
  Filled 2016-05-06 (×8): qty 3

## 2016-05-06 MED ORDER — ASPIRIN 300 MG RE SUPP: 300.0000 mg | RECTAL | Status: AC

## 2016-05-06 MED ORDER — BUDESONIDE 0.25 MG/2ML IN SUSP
0.2500 mg | Freq: Two times a day (BID) | RESPIRATORY_TRACT | Status: DC
Start: 2016-05-07 — End: 2016-05-09
  Administered 2016-05-07 – 2016-05-09 (×4): 0.25 mg via RESPIRATORY_TRACT
  Filled 2016-05-06 (×4): qty 2

## 2016-05-06 MED ORDER — HEPARIN SODIUM (PORCINE) 5000 UNIT/ML IJ SOLN
5000.0000 [IU] | Freq: Three times a day (TID) | INTRAMUSCULAR | Status: DC
Start: 2016-05-06 — End: 2016-05-09
  Administered 2016-05-07 – 2016-05-09 (×4): 5000 [IU] via SUBCUTANEOUS
  Filled 2016-05-06 (×5): qty 1

## 2016-05-06 MED ORDER — FERROUS SULFATE 325 (65 FE) MG PO TABS
325.0000 mg | ORAL_TABLET | Freq: Every day | ORAL | Status: DC
  Administered 2016-05-07 – 2016-05-09 (×3): 325 mg via ORAL
  Filled 2016-05-06 (×3): qty 1

## 2016-05-06 MED ORDER — BUDESONIDE 0.25 MG/2ML IN SUSP: 0.2500 mg | Freq: Three times a day (TID) | RESPIRATORY_TRACT | Status: DC

## 2016-05-06 MED ORDER — NITROGLYCERIN 0.4 MG SL SUBL
0.4000 mg | SUBLINGUAL_TABLET | SUBLINGUAL | Status: DC | PRN
  Administered 2016-05-06: 0.4 mg via SUBLINGUAL

## 2016-05-06 MED ORDER — METHYLPREDNISOLONE SODIUM SUCC 125 MG IJ SOLR
125.0000 mg | Freq: Once | INTRAMUSCULAR | Status: AC
  Administered 2016-05-06: 125 mg via INTRAVENOUS
  Filled 2016-05-06: qty 2

## 2016-05-06 MED ORDER — SODIUM CHLORIDE 0.9 % IV SOLN: 250.0000 mL | INTRAVENOUS | Status: DC | PRN

## 2016-05-06 MED ORDER — ATORVASTATIN CALCIUM 20 MG PO TABS
40.0000 mg | ORAL_TABLET | Freq: Every day | ORAL | Status: DC
  Administered 2016-05-07 – 2016-05-08 (×2): 40 mg via ORAL
  Filled 2016-05-06 (×2): qty 2

## 2016-05-06 MED ORDER — METHYLPREDNISOLONE SODIUM SUCC 40 MG IJ SOLR
40.0000 mg | Freq: Two times a day (BID) | INTRAMUSCULAR | Status: AC
  Administered 2016-05-07 – 2016-05-08 (×4): 40 mg via INTRAVENOUS
  Filled 2016-05-06 (×4): qty 1

## 2016-05-06 MED ORDER — ASPIRIN 81 MG PO CHEW
324.0000 mg | CHEWABLE_TABLET | ORAL | Status: AC
  Filled 2016-05-06: qty 4

## 2016-05-06 MED ORDER — TIOTROPIUM BROMIDE MONOHYDRATE 18 MCG IN CAPS: 18.0000 ug | ORAL_CAPSULE | Freq: Every day | RESPIRATORY_TRACT | Status: DC

## 2016-05-06 MED ORDER — MOMETASONE FURO-FORMOTEROL FUM 200-5 MCG/ACT IN AERO: 2.0000 | INHALATION_SPRAY | Freq: Two times a day (BID) | RESPIRATORY_TRACT | Status: DC

## 2016-05-06 MED ORDER — FUROSEMIDE 10 MG/ML IJ SOLN
40.0000 mg | Freq: Once | INTRAMUSCULAR | Status: DC
  Filled 2016-05-06: qty 4

## 2016-05-06 NOTE — ED Triage Notes (Signed)
Arrives from [facility] with respiratory distress. Per EMS patient was given resp treatments and ativan at nursing home. Pt appears weak, resp appears slightly labored, pt is drowsy and poor historian.

## 2016-05-06 NOTE — Progress Notes (Signed)
PRE HD INFO 

## 2016-05-06 NOTE — ED Provider Notes (Signed)
[facility] Emergency Department Provider Note  ____________________________________________  Time seen: Approximately 6:49 PM  I have reviewed the triage vital signs and the nursing notes.   HISTORY  Chief Complaint Respiratory Distress  Level 5 caveat:  Portions of the history and physical were unable to be obtained due to respiratory status   HPI [name] is a 75 y.o. male history of ESRD on HD (TTS), COPD, CHF with an EF of 25-30%, paroxysmal atrial fibrillation who presents for evaluation of shortness of breath. History is limited due to patient's severe respiratory distress. Patient reports the shortness of breath started today. He denies chest pain or fever, denies coughing. He reports that the shortness of breath felt a little bit improved with the breathing treatment from EMS. Patient is unclear if he received dialysis last Saturday. He does report that he had a full treatment on Thursday. Patient endorses severe shortness of breath. He uses 3 L nasal cannula baseline.  Past Medical History:  Diagnosis Date  . Allergic rhinitis   . Anemia    a. baseline hgb ~ 8  . Bladder cancer ([place])   . Chronic combined systolic and diastolic CHF (congestive heart failure) ([place])    a. echo 12/2014: EF 25-30%, anterior wall wall motion abnormalities; b. planned ischemic evaluation once patient is stable medically; c. echo 01/2015: EF 45-50%, no RWMA, mild AT, LA mildly dilated, mod pericardial effusion along LV free wall, no evidence of hemodynamic compromise  . Cognitive communication deficit   . COPD (chronic obstructive pulmonary disease) ([place])   . Dementia   . Detrusor sphincter dyssynergia   . ESRD on hemodialysis ([place])    a. Tuesday, Thursday, and Saturdays  . GERD (gastroesophageal reflux disease)   . History of septic shock    a. 01/2015  . History of small bowel obstruction    a. 01/2015  . Hydronephrosis   . Hypercholesteremia   .  Hypertension   . 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 Incontinence   . Magnesium deficiency   . Mini stroke ([place])   . Overactive bladder   . PAF (paroxysmal atrial fibrillation) ([place])    a. new onset 12/2014 in the setting of UTI, sepsis, hypotension, and anemia; b. not on long term anticoagulation given anemia; c. family aware of stroke risk, they are ok with this; d. on amiodarone   . Prostate cancer [facility])     Patient Active Problem List   Diagnosis Date Noted  . History of prostate cancer 02/12/2016  . COPD (chronic obstructive pulmonary disease) ([place])   . 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 COPD (chronic obstructive pulmonary disease) with chronic bronchitis ([place]) 11/06/2015  . Chronic diastolic heart failure ([place]) 08/11/2015  . Generalized weakness 05/08/2015  . Other emphysema ([place]) 05/08/2015  . Tobacco use disorder 05/08/2015  . Hypokalemia 05/08/2015  . Pressure ulcer 05/05/2015  . Symptomatic anemia 05/04/2015  . CAFL (chronic airflow limitation) ([place]) 04/10/2015  . Malnutrition of moderate degree ([place]) 03/22/2015  . Incontinence 02/27/2015  . Persistent atrial fibrillation ([place]) 02/27/2015  . Preoperative cardiovascular examination 02/27/2015  . ESRD on hemodialysis ([place])   . Chronic kidney disease (CKD), stage IV (severe) ([place]) 03/01/2014  . Essential (primary) hypertension 03/01/2014  . 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 Cystitis 11/11/2012  . Hydronephrosis 08/27/2012  . Bladder cancer ([place]) 08/27/2012  . CA of prostate ([place]) 08/27/2012  . Bone metastases ([place]) 08/27/2012  Past Surgical History:  Procedure Laterality Date  . AV FISTULA PLACEMENT     left arm  . CYSTOSCOPY W/ URETERAL STENT PLACEMENT    . PERIPHERAL VASCULAR CATHETERIZATION N/A 01/24/2016   Procedure: Endovascular Repair/Stent Graft;  Surgeon: Katha Cabal, MD;  Location: [facility];  Service: Cardiovascular;  Laterality: N/A;  .  PROSTATE SURGERY     Removal  . TRANSURETHRAL RESECTION OF BLADDER TUMOR WITH GYRUS (TURBT-GYRUS)    . URETERAL STENT PLACEMENT      Prior to Admission medications   Medication Sig Start Date End Date Taking? Authorizing Provider  acetaminophen (TYLENOL) 325 MG tablet Take 650 mg by mouth every 8 (eight) hours as needed. For general discomfort    Historical Provider, MD  albuterol (PROVENTIL) (2.5 MG/3ML) 0.083% nebulizer solution Take 3 mLs (2.5 mg total) by nebulization every 4 (four) hours as needed for wheezing or shortness of breath. 06/10/15   Fritzi Mandes, MD  amiodarone (PACERONE) 200 MG tablet Take 1 tablet (200 mg total) by mouth daily. 09/13/15   Gladstone Lighter, MD  atorvastatin (LIPITOR) 40 MG tablet Take 40 mg by mouth at bedtime.    Historical Provider, MD  budesonide-formoterol (SYMBICORT) 160-4.5 MCG/ACT inhaler Inhale 2 puffs into the lungs 2 (two) times daily.    Historical Provider, MD  carvedilol (COREG) 3.125 MG tablet Take 1 tablet (3.125 mg total) by mouth 2 (two) times daily with a meal. 03/14/16   Gladstone Lighter, MD  ferrous sulfate 325 (65 FE) MG tablet Take 325 mg by mouth daily.    Historical Provider, MD  guaiFENesin (MUCINEX) 600 MG 12 hr tablet Take 600 mg by mouth 2 (two) times daily. (Take every morning and at bedtime for cough)    Historical Provider, MD  guaifenesin (ROBITUSSIN) 100 MG/5ML syrup Take 200 mg by mouth every 4 (four) hours as needed for cough.    Historical Provider, MD  ipratropium-albuterol (DUONEB) 0.5-2.5 (3) MG/3ML SOLN Take 3 mLs by nebulization 3 (three) times daily.    Historical Provider, MD  lamoTRIgine (LAMICTAL) 25 MG tablet Take 50 mg by mouth at bedtime. Start on July 14th. 04/05/16   Historical Provider, MD  LORazepam (ATIVAN) 0.5 MG tablet Take 1 tablet (0.5 mg total) by mouth every 4 (four) hours as needed for anxiety. 04/04/16   Epifanio Lesches, MD  magnesium oxide (MAG-OX) 400 MG tablet Take 1 tablet (400 mg total) by mouth  every evening. 09/13/15   Gladstone Lighter, MD  ondansetron (ZOFRAN) 8 MG tablet Take by mouth every 8 (eight) hours as needed for nausea.    Historical Provider, MD  oxyCODONE (OXY IR/ROXICODONE) 5 MG immediate release tablet Take 1 tablet (5 mg total) by mouth every 6 (six) hours as needed for moderate pain or severe pain. 04/04/16   Epifanio Lesches, MD  OXYGEN Inhale 2-3 L into the lungs as needed. For shortness of breath, respiratory distress or O2 sats <90.    Historical Provider, MD  POLYETHYLENE GLYCOL 3350 PO Take 17 g by mouth daily as needed. For constipation. *Mix 1 capful in 6-8 ounces of beverage*    Historical Provider, MD  sevelamer carbonate (RENVELA) 800 MG tablet Take 800 mg by mouth 3 (three) times daily before meals.     Historical Provider, MD  tiotropium (SPIRIVA) 18 MCG inhalation capsule Place 1 capsule (18 mcg total) into inhaler and inhale daily. 09/13/15   Gladstone Lighter, MD    Allergies Penicillins  Family History  Problem Relation  Age of Onset  . Stroke Mother     Social History Social History  Substance Use Topics  . Smoking status: Former Smoker    Packs/day: 0.25    Years: 56.00    Quit date: 05/13/2015  . Smokeless tobacco: Never Used  . Alcohol use No    Review of Systems  Constitutional: Negative for fever. Eyes: Negative for visual changes. ENT: Negative for sore throat. Cardiovascular: Negative for chest pain. Respiratory: + shortness of breath. Gastrointestinal: Negative for abdominal pain, vomiting or diarrhea. Genitourinary: Negative for dysuria. Musculoskeletal: Negative for back pain. Skin: Negative for rash. Neurological: Negative for headaches, weakness or numbness.  ____________________________________________   PHYSICAL EXAM:  VITAL SIGNS: ED Triage Vitals  Enc Vitals Group     BP 05/06/16 1837 (!) 167/94     Pulse Rate 05/06/16 1837 81     Resp 05/06/16 1837 (!) 22     Temp 05/06/16 1837 98.3 F (36.8 C)      Temp Source 05/06/16 1837 Oral     SpO2 05/06/16 1837 93 %     Weight --      Height 05/06/16 1843 6\' 2"  (1.88 m)     Head Circumference --      Peak Flow --      Pain Score --      Pain Loc --      Pain Edu? --      Excl. in [place]? --     Constitutional: Alert, severe respiratory distress HEENT:      Head: Normocephalic and atraumatic.         Eyes: Conjunctivae are normal. Sclera is non-icteric. EOMI. PERRL      Mouth/Throat: Mucous membranes are moist.       Neck: Supple with no signs of meningismus. Cardiovascular: Tachycardic with regular rhythm. No murmurs, gallops, or rubs. 2+ symmetrical distal pulses are present in all extremities. No JVD. Respiratory: Severe respiratory distress, increased work of breathing, satting in the 70s on 5 L nasal cannula, expiratory wheezes on the upper lobes and crackles on the lower lobes bilaterally Gastrointestinal: Soft, non tender, and non distended with positive bowel sounds. No rebound or guarding. Genitourinary: No CVA tenderness. Musculoskeletal: Nontender with normal range of motion in all extremities. No edema, cyanosis, or erythema of extremities. Neurologic: Normal speech and language. Face is symmetric. Moving all extremities. No gross focal neurologic deficits are appreciated. Skin: Skin is warm, dry and intact. No rash noted.   ____________________________________________   LABS (all labs ordered are listed, but only abnormal results are displayed)  Labs Reviewed  CBC WITH DIFFERENTIAL/PLATELET - Abnormal; Notable for the following:       Result Value   RBC 3.44 (*)    Hemoglobin 9.4 (*)    HCT 29.5 (*)    MCHC 31.7 (*)    RDW 17.9 (*)    Neutro Abs 8.6 (*)    Lymphs Abs 0.6 (*)    All other components within normal limits  BASIC METABOLIC PANEL - Abnormal; Notable for the following:    Chloride 99 (*)    Glucose, Bld 118 (*)    BUN 64 (*)    Creatinine, Ser 5.98 (*)    Calcium 8.5 (*)    GFR calc non Af Amer 8 (*)      GFR calc Af Amer 10 (*)    All other components within normal limits  TROPONIN I - Abnormal; Notable for the following:    Troponin I  0.03 (*)    All other components within normal limits  BLOOD GAS, ARTERIAL - Abnormal; Notable for the following:    pO2, Arterial 192 (*)    Bicarbonate 28.5 (*)    Acid-Base Excess 4.1 (*)    Allens test (pass/fail) POSITIVE (*)    All other components within normal limits  BRAIN NATRIURETIC PEPTIDE   ____________________________________________  EKG  ED ECG REPORT I, Rudene Re, the attending physician, personally viewed and interpreted this ECG.  Normal sinus rhythm, rate of 86, normal intervals, left axis deviation, T-wave inversions on lateral leads. No ST elevations. Unchanged from prior ____________________________________________  RADIOLOGY  CXR: Stable cardiomegaly, diffuse pulmonary edema, and small bilateral pleural effusions, consistent with congestive heart failure ____________________________________________   PROCEDURES  Procedure(s) performed: None Procedures Critical Care performed: yes  CRITICAL CARE Performed by: Rudene Re  ?  Total critical care time: 45 min  Critical care time was exclusive of separately billable procedures and treating other patients.  Critical care was necessary to treat or prevent imminent or life-threatening deterioration.  Critical care was time spent personally by me on the following activities: development of treatment plan with patient and/or surrogate as well as nursing, discussions with consultants, evaluation of patient's response to treatment, examination of patient, obtaining history from patient or surrogate, ordering and performing treatments and interventions, ordering and review of laboratory studies, ordering and review of radiographic studies, pulse oximetry and re-evaluation of patient's condition.  ____________________________________________   INITIAL  IMPRESSION / ASSESSMENT AND PLAN / ED COURSE   75 y.o. male history of ESRD on HD (TTS), COPD, CHF with an EF of 25-30%, paroxysmal atrial fibrillation who presents for evaluation of severe shortness of breath. Patient is hypoxic, tachypneic, has crackles in bilateral lower lobes and wheezing on upper lobes. Satting 100% on NRB. BiPAP ordered, DuoNeb treatment order, Solu-Medrol ordered. We'll give Lasix as patient still makes urine. Presentation concerning for a mixed picture of COPD and CHF exacerbation. EKG unchanged from prior. Labs are pending including a VBG and BNP and troponin.  Clinical Course  Comment By Time  VBG showing normal pH and CO2. CXR showing pulmonary edema. Patient remains on BiPAP. Lasix and nitro given. Remaining of labs pending. Patient reports that he feels improved.  Rudene Re, MD 08/14 1949  Patient now reports that he does not make urine so lasix was cancelled. CXR concerning for pulmonary edema and pleural effusions. Waiting for nephrology. Patient satting 96% on BiPAP. Rudene Re, MD 08/14 2020  Spoke with Dr. Holley Raring, nephrology who will call in a nurse for HD tonight. In the meantime patient will continue on BiPAP. Will discuss with Hospitalist for admission. Rudene Re, MD 08/14 2024  Discussed with Intensivist for admission. HD RN coming in for HD. Patient and wife updated.  Rudene Re, MD 08/14 2044    Pertinent labs & imaging results that were available during my care of the patient were reviewed by me and considered in my medical decision making (see chart for details).    ____________________________________________   FINAL CLINICAL IMPRESSION(S) / ED DIAGNOSES  Final diagnoses:  Acute respiratory failure with hypoxia (HCC)  Acute on chronic congestive heart failure, unspecified congestive heart failure type ([place])      NEW MEDICATIONS STARTED DURING THIS VISIT:  New Prescriptions   No medications on file     Note:   This document was prepared using Dragon voice recognition software and may include unintentional dictation errors.    Rudene Re,  MD 05/06/16 2051

## 2016-05-06 NOTE — ED Notes (Signed)
Report called to CCU, informed pt will be in room once 3 hr dialysis is over.

## 2016-05-06 NOTE — Progress Notes (Signed)
Start of hd 

## 2016-05-06 NOTE — ED Notes (Signed)
Pt transferred to dialysis with Kathlee Nations, RN and Wille Glaser, RT

## 2016-05-06 NOTE — ED Notes (Signed)
Dialysis nurse called about taking pt to dialysis. Plan is to take pt to dialysis, have dialysis for 3 hours, then go to inpt room.

## 2016-05-06 NOTE — Progress Notes (Signed)
PRE HD ASSESSMENT 

## 2016-05-06 NOTE — ED Notes (Signed)
Bethena Roys, nurse from [facility], called and informed that pt will be admitted.

## 2016-05-06 NOTE — ED Notes (Signed)
This RN and [place], RN moved pt up in bed. Pt verbalized comfort.

## 2016-05-06 NOTE — H&P (Addendum)
PULMONARY / CRITICAL CARE MEDICINE   Name: [name] MRN: [id number] DOB: 24-Jun-1941    ADMISSION DATE:  05/06/2016 CONSULTATION DATE:  05/06/2016  REFERRING MD:  Dr. 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  He presented to [facility] ER on 8/14 from [facility] with shortness of breath onset today.  Upon EMS arrival pt received a breathing treatment which slightly improved his symptoms.  According to pts HCPOA pt has not missed any hemodialysis sessions his last session was 04/27/16.  He uses chronic O2 at 3L via nasal canula.  PCCM consulted 8/14 due to acute on chronic hypoxic respiratory failure secondary to CHF exacerbation and AECOPD.  PAST MEDICAL HISTORY :  He  has a past medical history of Allergic rhinitis; Anemia; Bladder cancer ([place]); Chronic combined systolic and diastolic CHF (congestive heart failure) ([place]); Cognitive communication deficit; COPD (chronic obstructive pulmonary disease) ([place]); Dementia; Detrusor sphincter dyssynergia; ESRD on hemodialysis ([place]); GERD (gastroesophageal reflux disease); History of septic shock; History of small bowel obstruction; Hydronephrosis; Hypercholesteremia; Hypertension; Iliac aneurysm ([place]); Incontinence; Magnesium deficiency; Mini stroke ([place]); Overactive bladder; PAF (paroxysmal atrial fibrillation) ([place]); and Prostate cancer ([place]).  PAST SURGICAL HISTORY: He  has a past surgical history that includes Cystoscopy w/ ureteral stent placement; Transurethral resection of bladder tumor with gyrus  (turbt-gyrus); Ureteral stent placement; ostate surgery; AV fistula placement; and Cardiac catheterization (N/A, 01/24/2016).  Allergies  Allergen Reactions  . Penicillins Hives, Itching and Swelling    Has patient had a PCN reaction causing immediate rash, facial/tongue/throat swelling, SOB or lightheadedness with hypotension: Yes Has patient had a PCN reaction causing severe rash involving mucus membranes or skin necrosis: No Has patient had a PCN reaction that required hospitalization No Has patient had a PCN reaction occurring within the last 10 years: No If all of the above answers are "NO", then may proceed with Cephalosporin use.    No current facility-administered medications on file prior to encounter.    Current Outpatient Prescriptions on File Prior to Encounter  Medication Sig  . acetaminophen (TYLENOL) 325 MG tablet Take 650 mg by mouth every 8 (eight) hours as needed. For general discomfort  . albuterol (PROVENTIL) (2.5 MG/3ML) 0.083% nebulizer solution Take 3 mLs (2.5 mg total) by nebulization every 4 (four) hours as needed for wheezing or shortness of breath.  Marland Kitchen amiodarone (PACERONE) 200 MG tablet Take 1 tablet (200 mg total) by mouth daily.  Marland Kitchen atorvastatin (LIPITOR) 40 MG tablet Take 40 mg by mouth at bedtime.  . budesonide-formoterol (SYMBICORT) 160-4.5 MCG/ACT inhaler Inhale 2 puffs into the lungs 2 (two) times daily.  . carvedilol (COREG) 3.125 MG tablet Take 1 tablet (3.125 mg total) by mouth 2 (two) times daily with a meal.  . ferrous sulfate 325 (65 FE) MG tablet Take 325 mg by mouth daily.  Marland Kitchen guaiFENesin (MUCINEX) 600 MG 12 hr tablet Take 600 mg by mouth 2 (two) times daily. (Take every morning and at bedtime for cough)  . guaifenesin (ROBITUSSIN) 100 MG/5ML syrup Take 200 mg by mouth every 4 (four) hours as needed for cough.  Marland Kitchen ipratropium-albuterol (DUONEB) 0.5-2.5 (3) MG/3ML SOLN Take 3  mLs by nebulization 3 (three) times daily.  Marland Kitchen lamoTRIgine (LAMICTAL) 25 MG  tablet Take 50 mg by mouth at bedtime. Start on July 14th.  Marland Kitchen LORazepam (ATIVAN) 0.5 MG tablet Take 1 tablet (0.5 mg total) by mouth every 4 (four) hours as needed for anxiety.  . magnesium oxide (MAG-OX) 400 MG tablet Take 1 tablet (400 mg total) by mouth every evening.  . ondansetron (ZOFRAN) 8 MG tablet Take by mouth every 8 (eight) hours as needed for nausea.  Marland Kitchen oxyCODONE (OXY IR/ROXICODONE) 5 MG immediate release tablet Take 1 tablet (5 mg total) by mouth every 6 (six) hours as needed for moderate pain or severe pain.  . OXYGEN Inhale 2-3 L into the lungs as needed. For shortness of breath, respiratory distress or O2 sats <90.  Marland Kitchen POLYETHYLENE GLYCOL 3350 PO Take 17 g by mouth daily as needed. For constipation. *Mix 1 capful in 6-8 ounces of beverage*  . sevelamer carbonate (RENVELA) 800 MG tablet Take 800 mg by mouth 3 (three) times daily before meals.   . tiotropium (SPIRIVA) 18 MCG inhalation capsule Place 1 capsule (18 mcg total) into inhaler and inhale daily.    FAMILY HISTORY:  His indicated that his mother is deceased. He indicated that his father is deceased.    SOCIAL HISTORY: He  reports that he quit smoking about a year ago. He has a 14.00 pack-year smoking history. He has never used smokeless tobacco. He reports that he does not drink alcohol or use drugs.  REVIEW OF SYSTEMS:   Unable to assess due to Bipap mask  SUBJECTIVE:  Pt currently on 50% Bipap no acute distress  VITAL SIGNS: BP 140/74   Pulse 72   Temp 98.3 F (36.8 C) (Oral)   Resp (!) 31   Ht 6\' 2"  (1.88 m)   SpO2 91%   HEMODYNAMICS:    VENTILATOR SETTINGS: FiO2 (%):  [70 %] 70 %  INTAKE / OUTPUT: No intake/output data recorded.  PHYSICAL EXAMINATION: General:  Chronically ill appearing male  Neuro:  Lethargic, follows commands, bilateral pupils 3 mm reactive HEENT:  Supple, no JVD Cardiovascular:  Sinus tachycardia, s1s2, no M/R/G Lungs:  Crackles bilateral bases diminished all other lobes,  even, non labored on bipap Abdomen:  +BS x4, soft, non tender, non distended Musculoskeletal:  Normal bulk and tone Skin:  Intact, no rashes or lesions  LABS:  BMET  Recent Labs Lab 05/06/16 1917  NA 139  K 4.2  CL 99*  CO2 29  BUN 64*  CREATININE 5.98*  GLUCOSE 118*    Electrolytes  Recent Labs Lab 05/06/16 1917  CALCIUM 8.5*    CBC  Recent Labs Lab 05/06/16 1917  WBC 10.2  HGB 9.4*  HCT 29.5*  PLT 224    Coag's No results for input(s): APTT, INR in the last 168 hours.  Sepsis Markers No results for input(s): LATICACIDVEN, PROCALCITON, O2SATVEN in the last 168 hours.  ABG  Recent Labs Lab 05/06/16 1917  PHART 7.45  PCO2ART 41  PO2ART 192*    Liver Enzymes No results for input(s): AST, ALT, ALKPHOS, BILITOT, ALBUMIN in the last 168 hours.  Cardiac Enzymes  Recent Labs Lab 05/06/16 1917  TROPONINI 0.03*    Glucose No results for input(s): GLUCAP in the last 168 hours.  Imaging Dg Chest Portable 1 View  Result Date: 05/06/2016 CLINICAL DATA:  Respiratory distress. Weakness. Congestive heart failure. COPD. End-stage renal disease. Prostate and bladder carcinoma. EXAM: PORTABLE CHEST 1 VIEW COMPARISON:  04/03/2016 FINDINGS: Moderate to severe cardiomegaly is stable as well as ectasia of the thoracic aorta. Bilateral mixed interstitial and airspace disease is again seen which appear symmetric has not significant change. This suspicious for pulmonary edema. Small bilateral pleural effusions are also not significantly changed. IMPRESSION: Stable cardiomegaly, diffuse pulmonary edema, and small bilateral pleural effusions, consistent with congestive heart failure. Electronically Signed   By: Earle Gell M.D.   On: 05/06/2016 19:49   STUDIES:  None  CULTURES: None  ANTIBIOTICS: None  SIGNIFICANT EVENTS: 8/14-Pt admitted to [facility] ICU due to acute on chronic hypoxic respiratory failure requiring bipap PCCM consulted  LINES/TUBES: PIV x1  8/14>>  ASSESSMENT / PLAN:  PULMONARY A: Acute on chronic hypoxic respiratory failure secondary to CHF exacerbation and AECOPD Hx: COPD P:   Bipap for now wean as tolerated Maintain O2 sats 92% to 94% Continue duonebs, solumedrol, and pulmicort  CXR in am Intermittent ABG's  CARDIOVASCULAR A:  Hypertension Hx: Paroxysmal atrial fibrillation, Chronic combined systolic and diastolic CHF, Iliac aneurysm, and Hypercholesteremia  P:  Continue outpatient lipitor, coreg, and amiodarone Continuous telemetry monitoring Serial troponins  RENAL A:   Chronic kidney disease (hemodialysis M,W,F) Hx: Hydronephrosis and Bladder Cancer P:   Nephrology consulted appreciate input Per Nephrology recommendations hemodialysis today 8/14 Trend BMP's Replace electrolytes as indicated  GASTROINTESTINAL A:   Hx: GERD P:   Keep NPO for now while on Bipap Pepcid for GERD  HEMATOLOGIC A:   Chronic anemia (baseline hgb 8) Hx: Prostate cancer  P:  Subq heparin and SCD's for VTE prophylaxis Monitor for s/sx of bleeding Trend CBC Transfuse for hgb <7  INFECTIOUS A:   No acute issues P:   Trend WBC and monitor fever curve  ENDOCRINE A:   No acute issues P:   Monitor serum glucose  NEUROLOGIC A:   Hx: Dementia and cognitive communication deficit P:   RASS goal: N/A Avoid sedating medication Frequent reorientation Lights on during the day    FAMILY  - Updates: Pts HCPOA updated about plan of care and questions answered 05/06/16   - Inter-disciplinary family meet or Palliative Care meeting due by:  05/13/16  Marda Stalker, [place] Pager [phone] (please enter 7 digits) PCCM Consult Pager [phone] (please enter 7 digits)  STAFF NOTE: I, Dr. Vilinda Boehringer have personally reviewed patient's available data, including medical history, events of note, physical examination and test results as part of my evaluation. I have discussed with NP Blakeney   and other care providers such as pharmacist, RN and RRT.    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 Further history from chart shows that his last dialysis session was 04/27/16, patient did receive a breathing treatment in the ED which improved his oxygenation. He did tolerate hemodialysis last night, this morning is more awake and alert, did tolerate BiPAP and is requesting for a break off the BiPAP today. He is chronically on 3 L of O2 at baseline. Again, today he is mildly somnolent, easily arousable  A: 75 year old male with history of prostate  cancer, proximal atrial fibrillation, and other complicated medical issues, noted to have acute shortness of breath, chest x-ray consistent with pulmonary edema and fluid overload, had urgent hemodialysis last night and now in stable condition.  Acute on chronic hypoxic respiratory failure CHF exacerbation Acute exacerbation of COPD in Hypertension History of proximal atrial fibrillation Combined systolic and diastolic CHF Iliac aneurysm Hypercholesteremia History of dementia History of chronic anemia  P:  - tolerate the BiPAP well overnight, especially after his hemodialysis session, more alert and awake morning,  transition to nasal cannula. May need intermittent bipap for the next 1-2 days.  -Keep O2 saturations greater than 88% -Transition from IV steroids to by mouth steroids -Continue bronchodilators and  inhaled corticosteroids -Avoid over sedating medications - HD per renal - transfer to hospitalist in AM, spoke with Dr. Verdell Carmine   .  Rest per NP/medical resident whose note is outlined above and that I agree with  The patient is critically ill with multiple organ systems failure and requires high complexity decision making for assessment and support, frequent evaluation and titration of therapies, application of advanced monitoring technologies and extensive interpretation of multiple databases.   Critical Care Time devoted to patient care services described in this note is  45 Minutes.   This time reflects time of care of this signee Dr Vilinda Boehringer.  This critical care time does not reflect procedure time, or teaching time or supervisory time of PA/NP/Med-student/Med Resident etc but could involve care discussion time.  Vilinda Boehringer, MD [facility] Pager [phone] (please enter 7-digits) On Call Pager [phone] (please enter 7-digits)  Note: This note was prepared with Dragon dictation along with smaller phrase technology. Any transcriptional errors that result from this process are unintentional.

## 2016-05-06 NOTE — ED Notes (Signed)
Resp starting bi pap, cxr done

## 2016-05-07 LAB — BASIC METABOLIC PANEL
Anion gap: 9 (ref 5–15)
BUN: 39 mg/dL — AB (ref 6–20)
CO2: 30 mmol/L (ref 22–32)
Calcium: 8.3 mg/dL — ABNORMAL LOW (ref 8.9–10.3)
Chloride: 101 mmol/L (ref 101–111)
Creatinine, Ser: 4.1 mg/dL — ABNORMAL HIGH (ref 0.61–1.24)
GFR calc Af Amer: 15 mL/min — ABNORMAL LOW (ref >60)
GFR, EST NON AFRICAN AMERICAN: 13 mL/min — AB (ref >60)
GLUCOSE: 153 mg/dL — AB (ref 65–99)
POTASSIUM: 3.7 mmol/L (ref 3.5–5.1)
Sodium: 140 mmol/L (ref 135–145)

## 2016-05-07 LAB — CBC
HCT: 27.9 % — ABNORMAL LOW (ref 40.0–52.0)
Hemoglobin: 8.9 g/dL — ABNORMAL LOW (ref 13.0–18.0)
MCH: 26.9 pg (ref 26.0–34.0)
MCHC: 31.8 g/dL — ABNORMAL LOW (ref 32.0–36.0)
MCV: 84.6 fL (ref 80.0–100.0)
PLATELETS: 135 10*3/uL — AB (ref 150–440)
RBC: 3.3 MIL/uL — AB (ref 4.40–5.90)
RDW: 18.4 % — AB (ref 11.5–14.5)
WBC: 16.4 10*3/uL — AB (ref 3.8–10.6)

## 2016-05-07 LAB — BLOOD GAS, ARTERIAL
ACID-BASE EXCESS: 8.6 mmol/L — AB (ref 0.0–3.0)
Allens test (pass/fail): POSITIVE — AB
Bicarbonate: 33.4 mEq/L — ABNORMAL HIGH (ref 21.0–28.0)
DELIVERY SYSTEMS: POSITIVE
Expiratory PAP: 6
FIO2: 0.4
Inspiratory PAP: 12
MECHANICAL RATE: 8
O2 Saturation: 98.6 %
PH ART: 7.46 — AB (ref 7.350–7.450)
Patient temperature: 37
pCO2 arterial: 47 mmHg (ref 32.0–48.0)
pO2, Arterial: 113 mmHg — ABNORMAL HIGH (ref 83.0–108.0)

## 2016-05-07 LAB — TROPONIN I
TROPONIN I: 0.04 ng/mL — AB (ref <0.03)
TROPONIN I: 0.03 ng/mL — AB (ref <0.03)

## 2016-05-07 LAB — MRSA PCR SCREENING: MRSA BY PCR: NEGATIVE

## 2016-05-07 LAB — PHOSPHORUS: Phosphorus: 3.5 mg/dL (ref 2.5–4.6)

## 2016-05-07 LAB — GLUCOSE, CAPILLARY: Glucose-Capillary: 132 mg/dL — ABNORMAL HIGH (ref 65–99)

## 2016-05-07 LAB — MAGNESIUM: MAGNESIUM: 2.3 mg/dL (ref 1.7–2.4)

## 2016-05-07 MED ORDER — ACETAMINOPHEN 325 MG PO TABS
650.0000 mg | ORAL_TABLET | Freq: Four times a day (QID) | ORAL | Status: DC | PRN
  Filled 2016-05-07: qty 2

## 2016-05-07 MED ORDER — SODIUM CHLORIDE 0.9 % IV SOLN: 100.0000 mL | INTRAVENOUS | Status: DC | PRN

## 2016-05-07 MED ORDER — LIDOCAINE HCL (PF) 1 % IJ SOLN
5.0000 mL | INTRAMUSCULAR | Status: DC | PRN
  Filled 2016-05-07: qty 5

## 2016-05-07 MED ORDER — HEPARIN SODIUM (PORCINE) 1000 UNIT/ML DIALYSIS: 1000.0000 [IU] | INTRAMUSCULAR | Status: DC | PRN

## 2016-05-07 MED ORDER — PENTAFLUOROPROP-TETRAFLUOROETH EX AERO
1.0000 "application " | INHALATION_SPRAY | CUTANEOUS | Status: DC | PRN
  Filled 2016-05-07: qty 30

## 2016-05-07 MED ORDER — LIDOCAINE-PRILOCAINE 2.5-2.5 % EX CREA
1.0000 "application " | TOPICAL_CREAM | CUTANEOUS | Status: DC | PRN
  Filled 2016-05-07: qty 5

## 2016-05-07 MED ORDER — ALTEPLASE 2 MG IJ SOLR: 2.0000 mg | Freq: Once | INTRAMUSCULAR | Status: DC | PRN

## 2016-05-07 NOTE — Progress Notes (Signed)
[facility]  ROUNDING NOTE   Subjective:  Patient presented again with shortness of breath. He was found to have pleural effusions as well as what appeared to be pulmonary edema on chest x-ray. He has history of underlying congestive heart failure ejection fraction 35-40%. Patient underwent hemodialysis last night.   Objective:  Vital signs in last 24 hours:  Temp:  [98 F (36.7 C)-98.9 F (37.2 C)] 98 F (36.7 C) (08/15 0900) Pulse Rate:  [57-81] 68 (08/15 1000) Resp:  [16-32] 22 (08/15 1000) BP: (116-167)/(59-94) 120/71 (08/15 1000) SpO2:  [86 %-100 %] 92 % (08/15 1000) FiO2 (%):  [40 %-70 %] 40 % (08/15 0731) Weight:  [79.8 kg (175 lb 14.8 oz)] 79.8 kg (175 lb 14.8 oz) (08/15 0116)  Weight change:  Filed Weights   05/07/16 0116  Weight: 79.8 kg (175 lb 14.8 oz)    Intake/Output: I/O last 3 completed shifts: In: -  Out: 1312 [Other:1312]   Intake/Output this shift:  No intake/output data recorded.  Physical Exam: General: No acute distress  Head: Normocephalic, atraumatic. Moist oral mucosal membranes  Eyes: Anicteric  Neck: Supple, trachea midline  Lungs:  Basilar rales, normal effort  Heart: S1S2 no rubs  Abdomen:  Soft, nontender,   Extremities:  peripheral edema.  Neurologic: Nonfocal, moving all four extremities  Skin: No lesions  Access: LUE AVF    Basic Metabolic Panel:  Recent Labs Lab 05/06/16 1917 05/07/16 0345  NA 139 140  K 4.2 3.7  CL 99* 101  CO2 29 30  GLUCOSE 118* 153*  BUN 64* 39*  CREATININE 5.98* 4.10*  CALCIUM 8.5* 8.3*  MG  --  2.3  PHOS  --  3.5    Liver Function Tests: No results for input(s): AST, ALT, ALKPHOS, BILITOT, PROT, ALBUMIN in the last 168 hours. No results for input(s): LIPASE, AMYLASE in the last 168 hours. No results for input(s): AMMONIA in the last 168 hours.  CBC:  Recent Labs Lab 05/06/16 1917 05/07/16 0345  WBC 10.2 16.4*  NEUTROABS 8.6*  --   HGB 9.4* 8.9*  HCT 29.5* 27.9*   MCV 85.8 84.6  PLT 224 135*    Cardiac Enzymes:  Recent Labs Lab 05/06/16 1917 05/06/16 2132 05/07/16 0345 05/07/16 0901  TROPONINI 0.03* 0.03* 0.04* 0.03*    BNP: Invalid input(s): POCBNP  CBG:  Recent Labs Lab 05/07/16 0118  GLUCAP 132*    Microbiology: Results for orders placed or performed during the hospital encounter of 05/06/16  MRSA PCR Screening     Status: None   Collection Time: 05/07/16  1:30 AM  Result Value Ref Range Status   MRSA by PCR NEGATIVE NEGATIVE Final    Comment:        The GeneXpert MRSA Assay (FDA approved for NASAL specimens only), is one component of a comprehensive MRSA colonization surveillance program. It is not intended to diagnose MRSA infection nor to guide or monitor treatment for MRSA infections.     Coagulation Studies: No results for input(s): LABPROT, INR in the last 72 hours.  Urinalysis: No results for input(s): COLORURINE, LABSPEC, PHURINE, GLUCOSEU, HGBUR, BILIRUBINUR, KETONESUR, PROTEINUR, UROBILINOGEN, NITRITE, LEUKOCYTESUR in the last 72 hours.  Invalid input(s): APPERANCEUR    Imaging: Dg Chest Portable 1 View  Result Date: 05/06/2016 CLINICAL DATA:  Respiratory distress. Weakness. Congestive heart failure. COPD. End-stage renal disease. Prostate and bladder carcinoma. EXAM: PORTABLE CHEST 1 VIEW COMPARISON:  04/03/2016 FINDINGS: Moderate to severe cardiomegaly is stable as well as  ectasia of the thoracic aorta. Bilateral mixed interstitial and airspace disease is again seen which appear symmetric has not significant change. This suspicious for pulmonary edema. Small bilateral pleural effusions are also not significantly changed. IMPRESSION: Stable cardiomegaly, diffuse pulmonary edema, and small bilateral pleural effusions, consistent with congestive heart failure. Electronically Signed   By: Earle Gell M.D.   On: 05/06/2016 19:49     Medications:     . amiodarone  200 mg Oral Daily  . aspirin  324 mg  Oral NOW   Or  . aspirin  300 mg Rectal NOW  . atorvastatin  40 mg Oral q1800  . budesonide (PULMICORT) nebulizer solution  0.25 mg Nebulization BID  . ferrous sulfate  325 mg Oral Q breakfast  . heparin  5,000 Units Subcutaneous Q8H  . ipratropium-albuterol  3 mL Nebulization Q4H  . methylPREDNISolone (SOLU-MEDROL) injection  40 mg Intravenous Q12H   sodium chloride, sodium chloride, sodium chloride, alteplase, heparin, lidocaine (PF), lidocaine-prilocaine, nitroGLYCERIN, pentafluoroprop-tetrafluoroeth  Assessment/ Plan:  75 y.o. 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 ESRD on HD TTS: Patient underwent emergent dialysis last night. He tolerated this well. We will plan for an additional hemodialysis treatment today to further improve his cardiopulmonary status.  2. Acute systolic heart failure exacerbation: Most recent ejection fraction was 35-40%. He has now experienced another exacerbation of heart failure. Ultrafiltration as above.  3. Anemia of chronic kidney disease. Hemoglobin 8.9. Start the patient on Epogen 10,000 units with dialysis.  4. Secondary hyperparathyroidism. Check intact PTH and phosphorus with dialysis today.   Zoria Rawlinson 8/15/201710:27 AM

## 2016-05-07 NOTE — Progress Notes (Signed)
Admission documentation not complete due to pt. Being arousable but lethargic.  Pt's POA will be returning today.

## 2016-05-07 NOTE — Progress Notes (Signed)
During rounds Dr. Stevenson Clinch gave order for patient to be transferred to stepdown status.

## 2016-05-07 NOTE — Progress Notes (Signed)
POST HD ASSESSMENT 

## 2016-05-07 NOTE — Care Management Note (Addendum)
Case Management Note  Patient Details  Name: [name] MRN: [id number] Date of Birth: 1940-09-29  Subjective/Objective:                   Met with patient to discuss discharge planning. Message left for Karenann Cai DOA with [facility] [phone] for update on patient and his baseline status. Patient states he lives at [facility]. I understand that it is assisted living level of care. He thinks that he is on O2 at the facility. He states he goes to dialysis T, T, Sat mornings at [place]. Action/Plan: RNCM will continue to follow. CSW consult placed.   Expected Discharge Date:                  Expected Discharge Plan:     In-House Referral:     Discharge planning Services  CM Consult  Post Acute Care Choice:    Choice offered to:  Patient  DME Arranged:    DME Agency:     HH Arranged:    [place] Agency:     Status of Service:  In process, will continue to follow  If discussed at Long Length of Stay Meetings, dates discussed:    Additional Comments: Received callback from [employer] at [facility]. Patient is from the SNF. O2 chronic at 2 Liters. He is a sit to stand lift at baseline. He "goes backwards". He has not ambulated since admission per [place]. Transfer to wheelchair to dialysis- transported by [facility]. Chair time recently changed to 0645 AM per [place]. He is non compliant with diet- family brings in food and then he goes into fluid overload. Per Crystal they have been educated but continue to do it.    Marshell Garfinkel, RN 05/07/2016, 11:22 AM

## 2016-05-07 NOTE — Progress Notes (Signed)
END OF HD TX 

## 2016-05-07 NOTE — Progress Notes (Signed)
PRE HD INFO 

## 2016-05-07 NOTE — Progress Notes (Signed)
POST HD VITALS 

## 2016-05-07 NOTE — Clinical Social Work Note (Signed)
Clinical Social Work Assessment  Patient Details  Name: [name] MRN: [id number] Date of Birth: September 25, 1940  Date of referral:  05/07/16               Reason for consult:  Facility Placement                Permission sought to share information with:    Permission granted to share information::     Name::        Agency::     Relationship::     Contact Information:     Housing/Transportation Living arrangements for the past 2 months:  [place] of Information:   (patient's next of kin/contact: Ethan [id number] [phone]) Patient Interpreter Needed:  None Criminal Activity/Legal Involvement Pertinent to Current Situation/Hospitalization:  No - Comment as needed Significant Relationships:  Other Family Members (cousin) Lives with:  Facility Resident Do you feel safe going back to the place where you live?  Yes Need for family participation in patient care:  Yes (Comment)  Care giving concerns:  None: patient is a long term resident with [facility] SNF.   Social Worker assessment / plan:  Patient is somewhat improving according to MD documentation but still requiring stepdown status. CSW spoke with patient's next of kin and individual with whom he has wanted CSW to speak with on multiple admissions: Ms [name]: [id number]. [name] stated that she was more concerned about patient this time than she has been in the past. [name] stated she was not sure if he was going to make it yesterday but she stated she feels much better after speaking with him on the phone today. Patient can be very difficult to understand due to his speech impairment. Patient is also fluctuating in orientation. [name] stated confirmed that patient is still in long term care at [facility]. She does wish for him to return to [facility] when time.   Employment status:  Retired Nurse, adult PT Recommendations:  Not assessed at this  time Information / Referral to community resources:     Patient/Family's Response to care:  [name] expressed appreciation for [place] phone call.   Patient/Family's Understanding of and Emotional Response to Diagnosis, Current Treatment, and Prognosis:  [name] is very insightful regarding patient's medical condition and has been on this journey with patient for several years.  Emotional Assessment Appearance:  Appears stated age Attitude/Demeanor/Rapport:  Unable to Assess Affect (typically observed):  Unable to Assess Orientation:  Fluctuating Orientation (Suspected and/or reported [place]) Alcohol / Substance use:  Not Applicable Psych involvement (Current and /or in the community):  No (Comment)  Discharge Needs  Concerns to be addressed:  Care Coordination Readmission within the last 30 days:  No Current discharge risk:  None Barriers to Discharge:  No Barriers Identified   [name], LCSW 05/07/2016, 12:11 PM

## 2016-05-07 NOTE — Progress Notes (Signed)
Pt. Transferred from ICU to hospitalist service.  Admitted for resp. Failure due to Pulmonary edema from non compliance with dialysis.

## 2016-05-07 NOTE — Progress Notes (Signed)
Patient off unit by bed with [facility] orderly. Patient transferred to dialysis with tele on.  Alert with no distress noted when leaving ICU.

## 2016-05-07 NOTE — Progress Notes (Signed)
RN spoke with Ethan Stalker, NP about patient traveling off unit to dialysis without ICU RN.  NP gave order that it is okay for patient to travel off unit without ICU RN.

## 2016-05-07 NOTE — Progress Notes (Signed)
PRE HD ASSESSMENT 

## 2016-05-07 NOTE — NC FL2 (Signed)
[place] LEVEL OF CARE SCREENING TOOL     IDENTIFICATION  Patient Name: [name] Birthdate: 1941/05/18 Sex: male Admission Date (Current Location): 05/06/2016  [place] and [place] Number:  Engineering geologist and Address:  [employer], [address], [place], [place] [address]      Provider Number: [id number]  Attending Physician Name and Address:  Vilinda Boehringer, MD  Relative Name and Phone Number:       Current Level of Care: Hospital Recommended Level of Care: [place] Prior Approval Number:    Date Approved/Denied:   PASRR Number: [id number]  Discharge Plan: SNF    Current Diagnoses: Patient Active Problem List   Diagnosis Date Noted  . Acute respiratory failure ([place]) 05/06/2016  . History of prostate cancer 02/12/2016  . COPD (chronic obstructive pulmonary disease) ([place])   . Pseudoaneurysm of iliac artery ([place])   . 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 COPD (chronic obstructive pulmonary disease) with chronic bronchitis ([place]) 11/06/2015  . Chronic diastolic heart failure ([place]) 08/11/2015  . Generalized weakness 05/08/2015  . Other emphysema ([place]) 05/08/2015  . Tobacco use disorder 05/08/2015  . Hypokalemia 05/08/2015  . Pressure ulcer 05/05/2015  . Symptomatic anemia 05/04/2015  . CAFL (chronic airflow limitation) ([place]) 04/10/2015  . Malnutrition of moderate degree ([place]) 03/22/2015  . Incontinence 02/27/2015  . Persistent atrial fibrillation ([place]) 02/27/2015  . Preoperative cardiovascular examination 02/27/2015  . ESRD on hemodialysis ([place])   . Chronic kidney disease (CKD), stage IV (severe) ([place]) 03/01/2014  . Essential (primary) hypertension 03/01/2014  . 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 Cystitis 11/11/2012  . Hydronephrosis 08/27/2012  . Bladder cancer ([place]) 08/27/2012  . CA of prostate ([place]) 08/27/2012  . Bone metastases ([place]) 08/27/2012    Orientation RESPIRATION  BLADDER Height & Weight     Self, Place  Normal Continent Weight: 175 lb 14.8 oz (79.8 kg) Height:  6\' 1"  (185.4 cm)  BEHAVIORAL SYMPTOMS/MOOD NEUROLOGICAL BOWEL NUTRITION STATUS   (none)  (none) Continent Diet (renal with fluid restriction)  AMBULATORY STATUS COMMUNICATION OF NEEDS Skin   Extensive Assist Verbally Normal                       Personal Care Assistance Level of Assistance  Bathing, Dressing Bathing Assistance: Limited assistance   Dressing Assistance: Limited assistance     Functional Limitations Info  Speech     Speech Info: Impaired    SPECIAL CARE FACTORS FREQUENCY                       Contractures Contractures Info: Not present    Additional Factors Info  Code Status (outpatient dialysis) Code Status Info: full             Current Medications (05/07/2016):  This is the current hospital active medication list Current Facility-Administered Medications  Medication Dose Route Frequency Provider Last Rate Last Dose  . 0.9 %  sodium chloride infusion  250 mL Intravenous PRN Awilda Bill, NP      . 0.9 %  sodium chloride infusion  100 mL Intravenous PRN Munsoor Lateef, MD      . 0.9 %  sodium chloride infusion  100 mL Intravenous PRN Munsoor Lateef, MD      . alteplase (CATHFLO ACTIVASE) injection 2 mg  2 mg Intracatheter Once PRN Munsoor Lateef, MD      . amiodarone (PACERONE) tablet 200 mg  200 mg Oral Daily Awilda Bill, NP   200 mg at 05/07/16 [id number]  . aspirin chewable tablet 324 mg  324 mg Oral NOW Awilda Bill, NP       Or  . aspirin suppository 300 mg  300 mg Rectal NOW Awilda Bill, NP      . atorvastatin (LIPITOR) tablet 40 mg  40 mg Oral q1800 Awilda Bill, NP      . budesonide (PULMICORT) nebulizer solution 0.25 mg  0.25 mg Nebulization BID Awilda Bill, NP   0.25 mg at 05/07/16 0730  . ferrous sulfate tablet 325 mg  325 mg Oral Q breakfast Awilda Bill, NP   325 mg at 05/07/16 0906  . heparin injection 1,000  Units  1,000 Units Dialysis PRN Munsoor Lateef, MD      . heparin injection 5,000 Units  5,000 Units Subcutaneous Q8H Awilda Bill, NP      . ipratropium-albuterol (DUONEB) 0.5-2.5 (3) MG/3ML nebulizer solution 3 mL  3 mL Nebulization Q4H Awilda Bill, NP   3 mL at 05/07/16 1134  . lidocaine (PF) (XYLOCAINE) 1 % injection 5 mL  5 mL Intradermal PRN Munsoor Lateef, MD      . lidocaine-prilocaine (EMLA) cream 1 application  1 application Topical PRN Munsoor Lateef, MD      . methylPREDNISolone sodium succinate (SOLU-MEDROL) 40 mg/mL injection 40 mg  40 mg Intravenous Q12H Awilda Bill, NP   40 mg at 05/07/16 0612  . nitroGLYCERIN (NITROSTAT) SL tablet 0.4 mg  0.4 mg Sublingual Q5 min PRN Rudene Re, MD   0.4 mg at 05/06/16 1925  . pentafluoroprop-tetrafluoroeth (GEBAUERS) aerosol 1 application  1 application Topical PRN Munsoor Lateef, MD         Discharge Medications: Please see discharge summary for a list of discharge medications.  Relevant Imaging Results:  Relevant Lab Results:   Additional Information    [name], LCSW

## 2016-05-07 NOTE — Progress Notes (Signed)
HD START 

## 2016-05-08 DIAGNOSIS — J441 Chronic obstructive pulmonary disease with (acute) exacerbation: Secondary | ICD-10-CM

## 2016-05-08 DIAGNOSIS — E8779 Other fluid overload: Secondary | ICD-10-CM

## 2016-05-08 LAB — HEPATITIS B SURFACE ANTIBODY,QUALITATIVE: HEP B S AB: NONREACTIVE

## 2016-05-08 LAB — HEPATITIS B SURFACE ANTIGEN: HEP B S AG: NEGATIVE

## 2016-05-08 MED ORDER — MOMETASONE FURO-FORMOTEROL FUM 200-5 MCG/ACT IN AERO
2.0000 | INHALATION_SPRAY | Freq: Two times a day (BID) | RESPIRATORY_TRACT | Status: DC
Start: 2016-05-09 — End: 2016-05-09
  Administered 2016-05-09: 2 via RESPIRATORY_TRACT
  Filled 2016-05-08: qty 8.8

## 2016-05-08 MED ORDER — PREDNISONE 20 MG PO TABS: 20.0000 mg | ORAL_TABLET | Freq: Every day | ORAL | Status: DC

## 2016-05-08 MED ORDER — PREDNISONE 20 MG PO TABS: 30.0000 mg | ORAL_TABLET | Freq: Every day | ORAL | Status: DC

## 2016-05-08 MED ORDER — PREDNISONE 10 MG PO TABS: 10.0000 mg | ORAL_TABLET | Freq: Every day | ORAL | Status: DC

## 2016-05-08 MED ORDER — PREDNISONE 20 MG PO TABS
40.0000 mg | ORAL_TABLET | Freq: Every day | ORAL | Status: DC
  Administered 2016-05-09: 40 mg via ORAL
  Filled 2016-05-08: qty 2

## 2016-05-08 MED ORDER — IPRATROPIUM-ALBUTEROL 0.5-2.5 (3) MG/3ML IN SOLN
3.0000 mL | Freq: Three times a day (TID) | RESPIRATORY_TRACT | Status: DC
  Administered 2016-05-08 – 2016-05-09 (×3): 3 mL via RESPIRATORY_TRACT
  Filled 2016-05-08 (×3): qty 3

## 2016-05-08 MED ORDER — IPRATROPIUM-ALBUTEROL 0.5-2.5 (3) MG/3ML IN SOLN
3.0000 mL | Freq: Three times a day (TID) | RESPIRATORY_TRACT | Status: DC
  Administered 2016-05-08: 3 mL via RESPIRATORY_TRACT
  Filled 2016-05-08: qty 3

## 2016-05-08 MED ORDER — BISACODYL 10 MG RE SUPP
10.0000 mg | Freq: Every day | RECTAL | Status: DC | PRN
  Administered 2016-05-08: 10 mg via RECTAL
  Filled 2016-05-08: qty 1

## 2016-05-08 MED ORDER — TIOTROPIUM BROMIDE MONOHYDRATE 18 MCG IN CAPS
18.0000 ug | ORAL_CAPSULE | Freq: Every day | RESPIRATORY_TRACT | Status: DC
  Administered 2016-05-08 – 2016-05-09 (×2): 18 ug via RESPIRATORY_TRACT
  Filled 2016-05-08 (×2): qty 5

## 2016-05-08 MED ORDER — ALPRAZOLAM 0.25 MG PO TABS
0.2500 mg | ORAL_TABLET | Freq: Every evening | ORAL | Status: DC | PRN
  Administered 2016-05-08: 0.25 mg via ORAL
  Filled 2016-05-08: qty 1

## 2016-05-08 MED ORDER — MAGNESIUM HYDROXIDE 400 MG/5ML PO SUSP
30.0000 mL | Freq: Once | ORAL | Status: AC
  Administered 2016-05-08: 30 mL via ORAL
  Filled 2016-05-08: qty 30

## 2016-05-08 NOTE — Progress Notes (Signed)
Report given to [facility] on [place]. Patient to transfer shortly. [name]

## 2016-05-08 NOTE — Progress Notes (Signed)
Patient awake majority of shift, complaints of constipation given MOM and warmed prune juice with no result. Continues to have fine crackles, oxygenating well. See [facility] for further assessment update.

## 2016-05-08 NOTE — Progress Notes (Signed)
Received patient from CCU.

## 2016-05-08 NOTE — H&P (Signed)
PULMONARY / CRITICAL CARE MEDICINE   Name: AVIEL GUTTER MRN: [id number] DOB: [birth year]    ADMISSION DATE:  05/06/2016 CONSULTATION DATE:  05/06/2016  REFERRING MD:  Dr. 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  He presented to [facility] ER on 8/14 from [facility] with shortness of breath onset today.  Upon EMS arrival pt received a breathing treatment which slightly improved his symptoms.  According to pts HCPOA pt has not missed any hemodialysis sessions his last session was 04/27/16.  He uses chronic O2 at 3L via nasal canula.  PCCM consulted 8/14 due to acute on chronic hypoxic respiratory failure secondary to CHF exacerbation and AECOPD.  SUBJECTIVE: Tolerate another session of hemodialysis yesterday, today saturating 100% on room air at times, overall with significant clinical improvement, had mild complaints of feeling constipated. Has a history of dementia with lucid intervals, and chronic complaints of constipation.  PAST MEDICAL HISTORY :  He  has a past medical history of Allergic rhinitis; Anemia; Bladder cancer ([place]); Chronic combined systolic and diastolic CHF (congestive heart failure) ([place]); Cognitive communication deficit; COPD (chronic obstructive pulmonary disease) ([place]); Dementia; Detrusor sphincter dyssynergia; ESRD on hemodialysis ([place]); GERD (gastroesophageal reflux disease); History of septic shock; History of small bowel obstruction; Hydronephrosis; Hypercholesteremia; Hypertension; Iliac aneurysm ([place]); Incontinence; Magnesium  deficiency; Mini stroke ([place]); Overactive bladder; PAF (paroxysmal atrial fibrillation) ([place]); and Prostate cancer ([place]).  PAST SURGICAL HISTORY: He  has a past surgical history that includes Cystoscopy w/ ureteral stent placement; Transurethral resection of bladder tumor with gyrus (turbt-gyrus); Ureteral stent placement; ostate surgery; AV fistula placement; and Cardiac catheterization (N/A, 01/24/2016).  Allergies  Allergen Reactions  . Penicillins Hives, Itching and Swelling    Has patient had a PCN reaction causing immediate rash, facial/tongue/throat swelling, SOB or lightheadedness with hypotension: Yes Has patient had a PCN reaction causing severe rash involving mucus membranes or skin necrosis: No Has patient had a PCN reaction that required hospitalization No Has patient had a PCN reaction occurring within the last 10 years: No If all of the above answers are "NO", then may proceed with Cephalosporin use.    No current facility-administered medications on file prior to encounter.    Current Outpatient Prescriptions on File Prior to Encounter  Medication Sig  . acetaminophen (TYLENOL) 325 MG tablet Take 650 mg by mouth every 8 (eight) hours as needed. For general discomfort  . albuterol (PROVENTIL) (2.5 MG/3ML) 0.083% nebulizer solution Take 3 mLs (2.5 mg total) by nebulization every 4 (four) hours as needed for wheezing or shortness of breath.  Mar[place] Kitchen amiodarone (PACERONE) 200 MG tablet Take 1 tablet (200 mg total) by mouth daily.  Marland Kitchen atorvastatin (LIPITOR) 40 MG tablet Take 40 mg by mouth at bedtime.  . budesonide-formoterol (SYMBICORT) 160-4.5 MCG/ACT inhaler Inhale 2 puffs into the lungs 2 (two) times daily.  . carvedilol (COREG) 3.125 MG tablet Take 1 tablet (3.125 mg total) by mouth 2 (two) times daily with a meal.  . ferrous sulfate 325 (65 FE) MG tablet Take 325 mg by mouth daily.  Marland Kitchen guaiFENesin (MUCINEX) 600 MG 12 hr tablet Take 600 mg by mouth 2 (two)  times daily. (Take every  morning and at bedtime for cough)  . guaifenesin (ROBITUSSIN) 100 MG/5ML syrup Take 200 mg by mouth every 4 (four) hours as needed for cough.  Marland Kitchen ipratropium-albuterol (DUONEB) 0.5-2.5 (3) MG/3ML SOLN Take 3 mLs by nebulization 3 (three) times daily.  Marland Kitchen lamoTRIgine (LAMICTAL) 25 MG tablet Take 50 mg by mouth at bedtime. Start on July 14th.  Marland Kitchen LORazepam (ATIVAN) 0.5 MG tablet Take 1 tablet (0.5 mg total) by mouth every 4 (four) hours as needed for anxiety.  . magnesium oxide (MAG-OX) 400 MG tablet Take 1 tablet (400 mg total) by mouth every evening.  . ondansetron (ZOFRAN) 8 MG tablet Take by mouth every 8 (eight) hours as needed for nausea.  Marland Kitchen oxyCODONE (OXY IR/ROXICODONE) 5 MG immediate release tablet Take 1 tablet (5 mg total) by mouth every 6 (six) hours as needed for moderate pain or severe pain.  . OXYGEN Inhale 2-3 L into the lungs as needed. For shortness of breath, respiratory distress or O2 sats <90.  Marland Kitchen POLYETHYLENE GLYCOL 3350 PO Take 17 g by mouth daily as needed. For constipation. *Mix 1 capful in 6-8 ounces of beverage*  . sevelamer carbonate (RENVELA) 800 MG tablet Take 800 mg by mouth 3 (three) times daily before meals.   . tiotropium (SPIRIVA) 18 MCG inhalation capsule Place 1 capsule (18 mcg total) into inhaler and inhale daily.    FAMILY HISTORY:  His indicated that his mother is deceased. He indicated that his father is deceased.    SOCIAL HISTORY: He  reports that he quit smoking about a year ago. He has a 14.00 pack-year smoking history. He has never used smokeless tobacco. He reports that he does not drink alcohol or use drugs.  REVIEW OF SYSTEMS:   GEN: no fever HEENT: No headaches CVS:no palpitations RES:no cough, no sob ABD: feels constipated MSK: no joint paint  SUBJECTIVE:  Pt currently on 50% Bipap no acute distress  VITAL SIGNS: BP (!) 141/82   Pulse 76   Temp 98.4 F (36.9 C) (Oral)   Resp 20   Ht 6\' 1"  (1.854 m)   Wt 177 lb 7.5 oz (80.5 kg)   SpO2  100%   BMI 23.41 kg/m   HEMODYNAMICS:    VENTILATOR SETTINGS:    INTAKE / OUTPUT: I/O last 3 completed shifts: In: 630 [P.O.:630] Out: 3812 [Other:3812]  PHYSICAL EXAMINATION: General:  Chronically ill appearing male  Neuro:  Lethargic, follows commands, bilateral pupils 3 mm reactive HEENT:  Supple, no JVD Cardiovascular:  Sinus tachycardia, s1s2, no M/R/G Lungs:  Crackles bilateral bases diminished all other lobes; Improving Abdomen:  +BS x4, soft, non tender, non distended Musculoskeletal:  Normal bulk and tone Skin:  Intact, no rashes or lesions  LABS:  BMET  Recent Labs Lab 05/06/16 1917 05/07/16 0345  NA 139 140  K 4.2 3.7  CL 99* 101  CO2 29 30  BUN 64* 39*  CREATININE 5.98* 4.10*  GLUCOSE 118* 153*    Electrolytes  Recent Labs Lab 05/06/16 1917 05/07/16 0345  CALCIUM 8.5* 8.3*  MG  --  2.3  PHOS  --  3.5    CBC  Recent Labs Lab 05/06/16 1917 05/07/16 0345  WBC 10.2 16.4*  HGB 9.4* 8.9*  HCT 29.5* 27.9*  PLT 224 135*    Coag's No results for input(s): APTT, INR in the last 168 hours.  Sepsis Markers No results for input(s): LATICACIDVEN, PROCALCITON, O2SATVEN in the last 168 hours.  ABG  Recent Labs Lab 05/06/16 1917 05/07/16 0500  PHART 7.45 7.46*  PCO2ART 41 47  PO2ART 192* 113*    Liver Enzymes No results for input(s): AST, ALT, ALKPHOS, BILITOT, ALBUMIN in the last 168 hours.  Cardiac Enzymes  Recent Labs Lab 05/06/16 2132 05/07/16 0345 05/07/16 0901  TROPONINI 0.03* 0.04* 0.03*    Glucose  Recent Labs Lab 05/07/16 0118  GLUCAP 132*    Imaging No results found. STUDIES:  None  CULTURES: None  ANTIBIOTICS: None    LINES/TUBES: PIV x1 8/14>>  ASSESSMENT / PLAN:  Discussion: Pt admitted to [facility] ICU due to acute on chronic hypoxic respiratory failure requiring bipap x 8hrs PCCM consulted, now status post 2 sessions of hemodialysis with significant improvement in his pulmonary status, off  Bipap since 8/15 AM  PULMONARY A: Acute on chronic hypoxic respiratory failure secondary to CHF exacerbation and AECOPD - resolving Hx: COPD P:   Off BiPAP for the last 24 hours Maintain O2 sats >88% transition from IV Solu-Medrol to a weaning dose of prednisone (can start prednisone 40 mg, taper over 10 days.) Wean off Pulmicort Restart ICS/LABA Cont with TID duonebs and spiriva (home regiment) Restart Symbicort upon Discharge.    CARDIOVASCULAR A:  Hypertension Hx: Paroxysmal atrial fibrillation, Chronic combined systolic and diastolic CHF, Iliac aneurysm, and Hypercholesteremia  P:  Continue outpatient lipitor, coreg, and amiodarone Continuous telemetry monitoring Serial troponins  RENAL A:   Chronic kidney disease (hemodialysis M,W,F) Hx: Hydronephrosis and Bladder Cancer P:   Nephrology consulted appreciate input Per Nephrology recommendations hemodialysis today 8/14 Trend BMP's Replace electrolytes as indicated  GASTROINTESTINAL A:   Hx: GERD P:   Renal Diet Pepcid for GERD  HEMATOLOGIC A:   Chronic anemia (baseline hgb 8) Hx: Prostate cancer  P:  Subq heparin and SCD's for VTE prophylaxis Monitor for s/sx of bleeding Trend CBC Transfuse for hgb <7  INFECTIOUS A:   No acute issues P:   Trend WBC and monitor fever curve  ENDOCRINE A:   No acute issues P:   Monitor serum glucose  NEUROLOGIC A:   Hx: Dementia and cognitive communication deficit P:   RASS goal: N/A Avoid sedating medication Frequent reorientation Lights on during the day    FAMILY  - Updates: Pts HCPOA updated about plan of care and questions answered 05/06/16   - Inter-disciplinary family meet or Palliative Care meeting due by:  05/13/16   Critical Care Time devoted to patient care services described in this note is  35 Minutes.   This time reflects time of care of this signee Dr Vilinda Boehringer.  This critical care time does not reflect procedure time, or teaching time  or supervisory time of PA/NP/Med-student/Med Resident etc but could involve care discussion time.  Vilinda Boehringer, MD [facility] Pager [phone] (please enter 7-digits) On Call Pager [phone] (please enter 7-digits)  Note: This note was prepared with Dragon dictation along with smaller phrase technology. Any transcriptional errors that result from this process are unintentional.

## 2016-05-08 NOTE — Progress Notes (Signed)
[facility]  ROUNDING NOTE   Subjective:  Patient completed hemodialysis yesterday. He states that shortness of breath is significantly improved. Patient has periods of increased fluid intake. This is likely contributing to his underlying congestive heart failure. Patient is below his dry weight currently at 80.5 kg. He also complains of constipation today.  Objective:  Vital signs in last 24 hours:  Temp:  [97.7 F (36.5 C)-98.8 F (37.1 C)] 98.4 F (36.9 C) (08/16 0820) Pulse Rate:  [68-79] 73 (08/16 0900) Resp:  [16-29] 19 (08/16 0900) BP: (112-141)/(58-82) 130/71 (08/16 0900) SpO2:  [89 %-100 %] 100 % (08/16 0919) Weight:  [80.5 kg (177 lb 7.5 oz)-82.8 kg (182 lb 8.7 oz)] 80.5 kg (177 lb 7.5 oz) (08/15 2258)  Weight change: 3 kg (6 lb 9.8 oz) Filed Weights   05/07/16 0116 05/07/16 1920 05/07/16 2258  Weight: 79.8 kg (175 lb 14.8 oz) 82.8 kg (182 lb 8.7 oz) 80.5 kg (177 lb 7.5 oz)    Intake/Output: I/O last 3 completed shifts: In: 21 [P.O.:630] Out: 3812 [Other:3812]   Intake/Output this shift:  Total I/O In: 290 [P.O.:290] Out: -   Physical Exam: General: No acute distress  Head: Normocephalic, atraumatic. Moist oral mucosal membranes  Eyes: Anicteric  Neck: Supple, trachea midline  Lungs:  Basilar rales, normal effort  Heart: S1S2 no rubs  Abdomen:  Soft, nontender, Bowel sounds present   Extremities: Trace peripheral edema.  Neurologic: Nonfocal, moving all four extremities  Skin: No lesions  Access: LUE AVF    Basic Metabolic Panel:  Recent Labs Lab 05/06/16 1917 05/07/16 0345  NA 139 140  K 4.2 3.7  CL 99* 101  CO2 29 30  GLUCOSE 118* 153*  BUN 64* 39*  CREATININE 5.98* 4.10*  CALCIUM 8.5* 8.3*  MG  --  2.3  PHOS  --  3.5    Liver Function Tests: No results for input(s): AST, ALT, ALKPHOS, BILITOT, PROT, ALBUMIN in the last 168 hours. No results for input(s): LIPASE, AMYLASE in the last 168 hours. No results for  input(s): AMMONIA in the last 168 hours.  CBC:  Recent Labs Lab 05/06/16 1917 05/07/16 0345  WBC 10.2 16.4*  NEUTROABS 8.6*  --   HGB 9.4* 8.9*  HCT 29.5* 27.9*  MCV 85.8 84.6  PLT 224 135*    Cardiac Enzymes:  Recent Labs Lab 05/06/16 1917 05/06/16 2132 05/07/16 0345 05/07/16 0901  TROPONINI 0.03* 0.03* 0.04* 0.03*    BNP: Invalid input(s): POCBNP  CBG:  Recent Labs Lab 05/07/16 0118  GLUCAP 132*    Microbiology: Results for orders placed or performed during the hospital encounter of 05/06/16  MRSA PCR Screening     Status: None   Collection Time: 05/07/16  1:30 AM  Result Value Ref Range Status   MRSA by PCR NEGATIVE NEGATIVE Final    Comment:        The GeneXpert MRSA Assay (FDA approved for NASAL specimens only), is one component of a comprehensive MRSA colonization surveillance program. It is not intended to diagnose MRSA infection nor to guide or monitor treatment for MRSA infections.     Coagulation Studies: No results for input(s): LABPROT, INR in the last 72 hours.  Urinalysis: No results for input(s): COLORURINE, LABSPEC, PHURINE, GLUCOSEU, HGBUR, BILIRUBINUR, KETONESUR, PROTEINUR, UROBILINOGEN, NITRITE, LEUKOCYTESUR in the last 72 hours.  Invalid input(s): APPERANCEUR    Imaging: Dg Chest Portable 1 View  Result Date: 05/06/2016 CLINICAL DATA:  Respiratory distress. Weakness. Congestive heart failure.  COPD. End-stage renal disease. Prostate and bladder carcinoma. EXAM: PORTABLE CHEST 1 VIEW COMPARISON:  04/03/2016 FINDINGS: Moderate to severe cardiomegaly is stable as well as ectasia of the thoracic aorta. Bilateral mixed interstitial and airspace disease is again seen which appear symmetric has not significant change. This suspicious for pulmonary edema. Small bilateral pleural effusions are also not significantly changed. IMPRESSION: Stable cardiomegaly, diffuse pulmonary edema, and small bilateral pleural effusions, consistent with  congestive heart failure. Electronically Signed   By: Earle Gell M.D.   On: 05/06/2016 19:49     Medications:     . amiodarone  200 mg Oral Daily  . atorvastatin  40 mg Oral q1800  . budesonide (PULMICORT) nebulizer solution  0.25 mg Nebulization BID  . ferrous sulfate  325 mg Oral Q breakfast  . heparin  5,000 Units Subcutaneous Q8H  . ipratropium-albuterol  3 mL Nebulization Q4H  . methylPREDNISolone (SOLU-MEDROL) injection  40 mg Intravenous Q12H   sodium chloride, acetaminophen, nitroGLYCERIN  Assessment/ Plan:  75 y.o. 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 ESRD on HD TTS: Admitted for volume overload/pulmonary edema. - Counseled patient on fluid indiscretion today. No urgent indication for dialysis at the moment. We will plan for dialysis again tomorrow if patient is still here.  2. Acute systolic heart failure exacerbation: Most recent ejection fraction was 35-40%. He has now experienced another exacerbation of heart failure.  - Status improved after hemodialysis and ultrafiltration. Continue to monitor clinically.  3. Anemia of chronic kidney disease. We will start the patient back on Epogen 10,000 units IV with dialysis during the next treatment.   4. Secondary hyperparathyroidism. Phosphorus 3.5 and acceptable. Continue to monitor bone mineral metabolism parameters.   Louella Medaglia 8/16/20179:38 AM

## 2016-05-08 NOTE — Progress Notes (Signed)
[facility] [place] NAME: [name]    MR#:  [id number]  DATE OF BIRTH:  [birth year]  SUBJECTIVE:   Patient here due to acute respiratory failure with hypoxia secondary pulmonary edema and volume overload. Status post dialysis the past 2 days and has clinically improved. Complaining of constipation today.  REVIEW OF SYSTEMS:    Review of Systems  Constitutional: Negative for chills and fever.  HENT: Negative for congestion and tinnitus.   Eyes: Negative for blurred vision and double vision.  Respiratory: Negative for cough, shortness of breath and wheezing.   Cardiovascular: Negative for chest pain, orthopnea and PND.  Gastrointestinal: Positive for constipation. Negative for abdominal pain, diarrhea, nausea and vomiting.  Genitourinary: Negative for dysuria and hematuria.  Neurological: Negative for dizziness, sensory change and focal weakness.  All other systems reviewed and are negative.   Nutrition: Renal diet Tolerating Diet: Yes  DRUG ALLERGIES:   Allergies  Allergen Reactions  . Penicillins Hives, Itching and Swelling    Has patient had a PCN reaction causing immediate rash, facial/tongue/throat swelling, SOB or lightheadedness with hypotension: Yes Has patient had a PCN reaction causing severe rash involving mucus membranes or skin necrosis: No Has patient had a PCN reaction that required hospitalization No Has patient had a PCN reaction occurring within the last 10 years: No If all of the above answers are "NO", then may proceed with Cephalosporin use.    VITALS:  Blood pressure 138/69, pulse 72, temperature 98.3 F (36.8 C), temperature source Oral, resp. rate (!) 21, height 6\' 1"  (1.854 m), weight 80.5 kg (177 lb 7.5 oz), SpO2 98 %.  PHYSICAL EXAMINATION:   Physical Exam  GENERAL:  75 y.o.-year-old patient lying in the bed in no acute distress.  EYES: Pupils equal, round, reactive to light and accommodation.  No scleral icterus. Extraocular muscles intact.  HEENT: Head atraumatic, normocephalic. Oropharynx and nasopharynx clear.  NECK:  Supple, no jugular venous distention. No thyroid enlargement, no tenderness.  LUNGS: Normal breath sounds bilaterally, no wheezing, rhonchi, bibasilar rales. No use of accessory muscles of respiration.  CARDIOVASCULAR: S1, S2 normal. No murmurs, rubs, or gallops.  ABDOMEN: Soft, nontender, nondistended. Bowel sounds present. No organomegaly or mass.  EXTREMITIES: No cyanosis, clubbing or edema b/l.    NEUROLOGIC: Cranial nerves II through XII are intact. No focal Motor or sensory deficits b/l.  Globally weak PSYCHIATRIC: The patient is alert and oriented x 3.  SKIN: No obvious rash, lesion, or ulcer.   Left upper Extremity AV fistula with good bruit and thrill.  LABORATORY PANEL:   CBC  Recent Labs Lab 05/07/16 0345  WBC 16.4*  HGB 8.9*  HCT 27.9*  PLT 135*   ------------------------------------------------------------------------------------------------------------------  Chemistries   Recent Labs Lab 05/07/16 0345  NA 140  K 3.7  CL 101  CO2 30  GLUCOSE 153*  BUN 39*  CREATININE 4.10*  CALCIUM 8.3*  MG 2.3   ------------------------------------------------------------------------------------------------------------------  Cardiac Enzymes  Recent Labs Lab 05/07/16 0901  TROPONINI 0.03*   ------------------------------------------------------------------------------------------------------------------  RADIOLOGY:  Dg Chest Portable 1 View  Result Date: 05/06/2016 CLINICAL DATA:  Respiratory distress. Weakness. Congestive heart failure. COPD. End-stage renal disease. Prostate and bladder carcinoma. EXAM: PORTABLE CHEST 1 VIEW COMPARISON:  04/03/2016 FINDINGS: Moderate to severe cardiomegaly is stable as well as ectasia of the thoracic aorta. Bilateral mixed interstitial and airspace disease is again seen which appear symmetric has not  significant change. This suspicious for pulmonary edema. Small  bilateral pleural effusions are also not significantly changed. IMPRESSION: Stable cardiomegaly, diffuse pulmonary edema, and small bilateral pleural effusions, consistent with congestive heart failure. Electronically Signed   By: Earle Gell M.D.   On: 05/06/2016 19:49     ASSESSMENT AND PLAN:   75 year old male with past medical history of end-stage renal disease on hemodialysis, paroxysmal atrial fibrillation, history of prostate cancer, COPD, chronic combined/diastolic CHF, GERD, history of small bowel obstruction who presented to the hospital due to shortness of breath and noted to be in acute respiratory failure.  1. Acute respiratory failure with hypoxia-this was secondary to volume overload and pulmonary edema also mild underlying COPD exacerbation. -Patient has been dialyzed twice over the past 2 days and his O2 requirements and clinical status is improved. -Continue prednisone taper, Pulmicort nebs underlying COPD. No evidence of pneumonia on chest x-ray.  2. End-stage renal disease on hemodialysis-patient normally gets ounces a Monday Wednesday Friday. -He will resume his dialysis as scheduled. Continue further care as per nephrology.  3. COPD exacerbation-continue prednisone taper, DuoNeb's, Dulera, Spiriva.  4. Atrial fibrillation-rate controlled. Continue amiodarone.  5. Hyperlipidemia-continue atorvastatin.  Transfer to floor today. Likely DC Back to rehabilitation tomorrow.   All the records are reviewed and case discussed with Care Management/Social Worker. Management plans discussed with the patient, family and they are in agreement.  CODE STATUS: Full   DVT Prophylaxis: Heparin SQ  TOTAL TIME TAKING CARE OF THIS PATIENT: 30 minutes.   POSSIBLE D/C IN 1-2 DAYS, DEPENDING ON CLINICAL CONDITION.   Henreitta Leber M.D on 05/08/2016 at 4:14 PM  Between 7am to 6pm - Pager - [phone]  After 6pm go to  [email or web address] - Proofreader  [facility]  Office  [phone]  CC: Primary care physician; Dion Body, MD

## 2016-05-09 ENCOUNTER — Inpatient Hospital Stay: Payer: Medicare Other | Admitting: Hematology and Oncology

## 2016-05-09 ENCOUNTER — Inpatient Hospital Stay: Payer: Medicare Other

## 2016-05-09 ENCOUNTER — Other Ambulatory Visit: Payer: Self-pay | Admitting: Hematology and Oncology

## 2016-05-09 LAB — CBC
HCT: 25.8 % — ABNORMAL LOW (ref 40.0–52.0)
HEMOGLOBIN: 8.1 g/dL — AB (ref 13.0–18.0)
MCH: 26.8 pg (ref 26.0–34.0)
MCHC: 31.6 g/dL — ABNORMAL LOW (ref 32.0–36.0)
MCV: 84.9 fL (ref 80.0–100.0)
Platelets: 134 10*3/uL — ABNORMAL LOW (ref 150–440)
RBC: 3.04 MIL/uL — AB (ref 4.40–5.90)
RDW: 18.4 % — ABNORMAL HIGH (ref 11.5–14.5)
WBC: 12.2 10*3/uL — ABNORMAL HIGH (ref 3.8–10.6)

## 2016-05-09 LAB — PHOSPHORUS: PHOSPHORUS: 3.4 mg/dL (ref 2.5–4.6)

## 2016-05-09 MED ORDER — PREDNISONE 10 MG PO TABS: ORAL_TABLET | ORAL | Status: DC

## 2016-05-09 MED ORDER — EPOETIN ALFA 10000 UNIT/ML IJ SOLN
10000.0000 [IU] | INTRAMUSCULAR | Status: DC
  Administered 2016-05-09: 10000 [IU] via INTRAVENOUS

## 2016-05-09 MED ORDER — LORAZEPAM 0.5 MG PO TABS: 0.5000 mg | ORAL_TABLET | ORAL | 0 refills | Status: DC | PRN

## 2016-05-09 MED ORDER — OXYCODONE HCL 5 MG PO TABS: 5.0000 mg | ORAL_TABLET | Freq: Four times a day (QID) | ORAL | 0 refills | Status: DC | PRN

## 2016-05-09 NOTE — Progress Notes (Signed)
Pt refused to wear Bipap. Pt on 2 liter nasal cannula.No distress noted.

## 2016-05-09 NOTE — Progress Notes (Deleted)
[facility] day:  06/23/2015  Chief Complaint: [name] is a 75 y.o. male with end stage renal disease on dialysis, prostate and bladder cancer, who is seen for reassessment.  HPI: The patient was last seen in the medical oncology on 06/19/2015.  At that time, he was seen for assessment after  Admission for symptomatic anemia (hematocrit 20.5 and hemoglobin of 6.4).  He received 2 units of packed red blood cells.  The etiology of his anemia was felt to be multifactorial. He had anemia of chronic renal disease.  By his history, he was now receiving Procrit. He may have had some component of GI blood loss.  Diet was good.  He was to have followed up as needed.  In addition, he had prostate cancer.  He was receiving Lupron every 6 months.  Discussions were held regarding potential consideration of Casodex if disease progressed in the future.  He also had a history of a bladder cancer.  Urology notes indicated a cystostomy with a bladder biopsy on 010/04/2012.  Notes from 08/09/2014 revealed persistent hydronephrosis and bilateral stents.    Past Medical History:  Diagnosis Date  . Allergic rhinitis   . Anemia    a. baseline hgb ~ 8  . Bladder cancer ([place])   . Chronic combined systolic and diastolic CHF (congestive heart failure) ([place])    a. echo 12/2014: EF 25-30%, anterior wall wall motion abnormalities; b. planned ischemic evaluation once patient is stable medically; c. echo 01/2015: EF 45-50%, no RWMA, mild AT, LA mildly dilated, mod pericardial effusion along LV free wall, no evidence of hemodynamic compromise  . Cognitive communication deficit   . COPD (chronic obstructive pulmonary disease) ([place])   . Dementia   . Detrusor sphincter dyssynergia   . ESRD on hemodialysis ([place])    a. Tuesday, Thursday, and Saturdays  . GERD (gastroesophageal reflux disease)   . History of septic shock    a. 01/2015  . History of small bowel obstruction     a. 01/2015  . Hydronephrosis   . Hypercholesteremia   . Hypertension   . 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 Incontinence   . Magnesium deficiency   . Mini stroke ([place])   . Overactive bladder   . PAF (paroxysmal atrial fibrillation) ([place])    a. new onset 12/2014 in the setting of UTI, sepsis, hypotension, and anemia; b. not on long term anticoagulation given anemia; c. family aware of stroke risk, they are ok with this; d. on amiodarone   . Prostate cancer [facility])     Past Surgical History:  Procedure Laterality Date  . AV FISTULA PLACEMENT     left arm  . CYSTOSCOPY W/ URETERAL STENT PLACEMENT    . PERIPHERAL VASCULAR CATHETERIZATION N/A 01/24/2016   Procedure: Endovascular Repair/Stent Graft;  Surgeon: Katha Cabal, MD;  Location: [facility];  Service: Cardiovascular;  Laterality: N/A;  . PROSTATE SURGERY     Removal  . TRANSURETHRAL RESECTION OF BLADDER TUMOR WITH GYRUS (TURBT-GYRUS)    . URETERAL STENT PLACEMENT      Family History  Problem Relation Age of Onset  . Stroke Mother     Social History:  reports that he quit smoking about a year ago. He has a 14.00 pack-year smoking history. He has never used smokeless tobacco. He reports that he does not drink alcohol or use drugs.  The patient is alone today.  Transportation was provided by [employer]  transportation.  Allergies:  Allergies  Allergen Reactions  . Penicillins Hives, Itching and Swelling    Has patient had a PCN reaction causing immediate rash, facial/tongue/throat swelling, SOB or lightheadedness with hypotension: Yes Has patient had a PCN reaction causing severe rash involving mucus membranes or skin necrosis: No Has patient had a PCN reaction that required hospitalization No Has patient had a PCN reaction occurring within the last 10 years: No If all of the above answers are "NO", then may proceed with Cephalosporin use.    Current Medications: No current facility-administered medications for this visit.     No current outpatient prescriptions on file.   Facility-Administered Medications Ordered in Other Visits  Medication Dose Route Frequency Provider Last Rate Last Dose  . 0.9 %  sodium chloride infusion  250 mL Intravenous PRN Awilda Bill, NP      . acetaminophen (TYLENOL) tablet 650 mg  650 mg Oral Q6H PRN Awilda Bill, NP      . ALPRAZolam Duanne Moron) tablet 0.25 mg  0.25 mg Oral QHS PRN Alexis Hugelmeyer, DO   0.25 mg at 05/08/16 2342  . amiodarone (PACERONE) tablet 200 mg  200 mg Oral Daily Awilda Bill, NP   200 mg at 05/08/16 0955  . atorvastatin (LIPITOR) tablet 40 mg  40 mg Oral q1800 Awilda Bill, NP   40 mg at 05/08/16 1830  . bisacodyl (DULCOLAX) suppository 10 mg  10 mg Rectal Daily PRN Vishal Mungal, MD   10 mg at 05/08/16 1001  . budesonide (PULMICORT) nebulizer solution 0.25 mg  0.25 mg Nebulization BID Vishal Mungal, MD   0.25 mg at 05/08/16 2049  . ferrous sulfate tablet 325 mg  325 mg Oral Q breakfast Awilda Bill, NP   325 mg at 05/08/16 0823  . heparin injection 5,000 Units  5,000 Units Subcutaneous Q8H Awilda Bill, NP   5,000 Units at 05/09/16 0459  . ipratropium-albuterol (DUONEB) 0.5-2.5 (3) MG/3ML nebulizer solution 3 mL  3 mL Nebulization TID Henreitta Leber, MD   3 mL at 05/08/16 2049  . mometasone-formoterol (DULERA) 200-5 MCG/ACT inhaler 2 puff  2 puff Inhalation BID Vishal Mungal, MD      . nitroGLYCERIN (NITROSTAT) SL tablet 0.4 mg  0.4 mg Sublingual Q5 min PRN Rudene Re, MD   0.4 mg at 05/06/16 1925  . predniSONE (DELTASONE) tablet 40 mg  40 mg Oral Q breakfast Vilinda Boehringer, MD       Followed by  . [START ON 05/11/2016] predniSONE (DELTASONE) tablet 30 mg  30 mg Oral Q breakfast Vishal Mungal, MD       Followed by  . [START ON 05/13/2016] predniSONE (DELTASONE) tablet 20 mg  20 mg Oral Q breakfast Vishal Mungal, MD       Followed by  . [START ON 05/15/2016] predniSONE (DELTASONE) tablet 10 mg  10 mg Oral Q breakfast Vishal Mungal, MD        Followed by  . [START ON 05/17/2016] predniSONE (DELTASONE) tablet 10 mg  10 mg Oral Q breakfast Vishal Mungal, MD      . tiotropium (SPIRIVA) inhalation capsule 18 mcg  18 mcg Inhalation Daily Vishal Mungal, MD   18 mcg at 05/08/16 0955    Review of Systems:  GENERAL:  Feels "ok".  No fevers, sweats or weight loss. PERFORMANCE STATUS (ECOG):  3 HEENT:  Poor vision.  No runny nose, sore throat, mouth sores or tenderness. Lungs: Periodic shortness of breath.  No  cough.  No hemoptysis. Cardiac:  No chest pain, palpitations, orthopnea, or PND. GI:  Diarrhea, constipation.  Dark stools.  No abdominal pain.  No nausea, vomiting, or hematochezia. GU:  No urgency, frequency, dysuria, or hematuria. Musculoskeletal:  No back pain.  No joint pain.  No muscle tenderness. Extremities:  No pain or swelling. Skin:  No rashes or skin changes. Neuro:  No headache, numbness or weakness, balance or coordination issues. Endocrine:  No diabetes, thyroid issues, hot flashes or night sweats. Psych:  No mood changes, depression or anxiety. Pain:  No focal pain. Review of systems:  All other systems reviewed and found to be negative.  Physical Exam: There were no vitals taken for this visit. GENERAL:  Well developed, well nourished, sitting comfortably in a wheelchair in the exam room in no acute distress. MENTAL STATUS:  Alert and oriented to person, place and time. HEAD:  Pearline Cables hair with gray goatee.  Normocephalic, atraumatic, face symmetric, no Cushingoid features. EYES:  Dark rimmed glasses.  Brown eyes.  Pupils equal round and reactive to light and accomodation.  No conjunctivitis or scleral icterus. ENT:  Oropharynx clear without lesion.  Missing teeth.  Tongue normal. Mucous membranes moist.  RESPIRATORY:  Clear to auscultation without rales, wheezes or rhonchi. CARDIOVASCULAR:  Regular rate and rhythm without murmur, rub or gallop. ABDOMEN:  Soft, non-tender, with active bowel sounds, and no  hepatosplenomegaly.  No masses. SKIN:  No rashes, ulcers or lesions. EXTREMITIES: No edema, no skin discoloration or tenderness.  No palpable cords. LYMPH NODES: No palpable cervical, supraclavicular, axillary or inguinal adenopathy  NEUROLOGICAL: Unremarkable. PSYCH:  Appropriate.  Admission on 05/06/2016  Component Date Value Ref Range Status  . WBC 05/06/2016 10.2  3.8 - 10.6 K/uL Final  . RBC 05/06/2016 3.44* 4.40 - 5.90 MIL/uL Final  . Hemoglobin 05/06/2016 9.4* 13.0 - 18.0 g/dL Final  . HCT 05/06/2016 29.5* 40.0 - 52.0 % Final  . MCV 05/06/2016 85.8  80.0 - 100.0 fL Final  . MCH 05/06/2016 27.2  26.0 - 34.0 pg Final  . MCHC 05/06/2016 31.7* 32.0 - 36.0 g/dL Final  . RDW 05/06/2016 17.9* 11.5 - 14.5 % Final  . Platelets 05/06/2016 224  150 - 440 K/uL Final  . Neutrophils Relative % 05/06/2016 84  % Final  . Neutro Abs 05/06/2016 8.6* 1.4 - 6.5 K/uL Final  . Lymphocytes Relative 05/06/2016 6  % Final  . Lymphs Abs 05/06/2016 0.6* 1.0 - 3.6 K/uL Final  . Monocytes Relative 05/06/2016 7  % Final  . Monocytes Absolute 05/06/2016 0.7  0.2 - 1.0 K/uL Final  . Eosinophils Relative 05/06/2016 2  % Final  . Eosinophils Absolute 05/06/2016 0.2  0 - 0.7 K/uL Final  . Basophils Relative 05/06/2016 1  % Final  . Basophils Absolute 05/06/2016 0.0  0 - 0.1 K/uL Final  . Sodium 05/06/2016 139  135 - 145 mmol/L Final  . Potassium 05/06/2016 4.2  3.5 - 5.1 mmol/L Final  . Chloride 05/06/2016 99* 101 - 111 mmol/L Final  . CO2 05/06/2016 29  22 - 32 mmol/L Final  . Glucose, Bld 05/06/2016 118* 65 - 99 mg/dL Final  . BUN 05/06/2016 64* 6 - 20 mg/dL Final  . Creatinine, Ser 05/06/2016 5.98* 0.61 - 1.24 mg/dL Final  . Calcium 05/06/2016 8.5* 8.9 - 10.3 mg/dL Final  . GFR calc non Af Amer 05/06/2016 8* >60 mL/min Final  . GFR calc Af Amer 05/06/2016 10* >60 mL/min Final   Comment: (NOTE)  The eGFR has been calculated using the CKD EPI equation. This calculation has not been validated in all  clinical situations. eGFR's persistently <60 mL/min signify possible Chronic Kidney Disease.   . Anion gap 05/06/2016 11  5 - 15 Final  . B Natriuretic Peptide 05/06/2016 >4500.0* 0.0 - 100.0 pg/mL Final  . Troponin I 05/06/2016 0.03* <0.03 ng/mL Final   Comment: CRITICAL RESULT CALLED TO, READ BACK BY AND VERIFIED WITH KATE BUMGARNER AT 2045 05/06/2016 BY TFK.   Marland Kitchen FIO2 05/06/2016 0.70   Final  . Delivery systems 05/06/2016 BILEVEL POSITIVE AIRWAY PRESSURE   Final  . Inspiratory PAP 05/06/2016 12   Final  . Expiratory PAP 05/06/2016 6   Final  . pH, Arterial 05/06/2016 7.45  7.350 - 7.450 Final  . pCO2 arterial 05/06/2016 41  32.0 - 48.0 mmHg Final  . pO2, Arterial 05/06/2016 192* 83.0 - 108.0 mmHg Final  . Bicarbonate 05/06/2016 28.5* 21.0 - 28.0 mEq/L Final  . Acid-Base Excess 05/06/2016 4.1* 0.0 - 3.0 mmol/L Final  . O2 Saturation 05/06/2016 99.7  % Final  . Patient temperature 05/06/2016 37.0   Final  . Collection site 05/06/2016 RIGHT RADIAL   Final  . Sample type 05/06/2016 ARTERIAL DRAW   Final  . Allens test (pass/fail) 05/06/2016 POSITIVE* PASS Final  . Sodium 05/07/2016 140  135 - 145 mmol/L Final  . Potassium 05/07/2016 3.7  3.5 - 5.1 mmol/L Final  . Chloride 05/07/2016 101  101 - 111 mmol/L Final  . CO2 05/07/2016 30  22 - 32 mmol/L Final  . Glucose, Bld 05/07/2016 153* 65 - 99 mg/dL Final  . BUN 05/07/2016 39* 6 - 20 mg/dL Final  . Creatinine, Ser 05/07/2016 4.10* 0.61 - 1.24 mg/dL Final  . Calcium 05/07/2016 8.3* 8.9 - 10.3 mg/dL Final  . GFR calc non Af Amer 05/07/2016 13* >60 mL/min Final  . GFR calc Af Amer 05/07/2016 15* >60 mL/min Final   Comment: (NOTE) The eGFR has been calculated using the CKD EPI equation. This calculation has not been validated in all clinical situations. eGFR's persistently <60 mL/min signify possible Chronic Kidney Disease.   . Anion gap 05/07/2016 9  5 - 15 Final  . FIO2 05/07/2016 0.40   Final  . Delivery systems 05/07/2016  BILEVEL POSITIVE AIRWAY PRESSURE   Final  . Inspiratory PAP 05/07/2016 12   Final  . Expiratory PAP 05/07/2016 6.0   Final  . pH, Arterial 05/07/2016 7.46* 7.350 - 7.450 Final  . pCO2 arterial 05/07/2016 47  32.0 - 48.0 mmHg Final  . pO2, Arterial 05/07/2016 113* 83.0 - 108.0 mmHg Final  . Bicarbonate 05/07/2016 33.4* 21.0 - 28.0 mEq/L Final  . Acid-Base Excess 05/07/2016 8.6* 0.0 - 3.0 mmol/L Final  . O2 Saturation 05/07/2016 98.6  % Final  . Patient temperature 05/07/2016 37.0   Final  . Collection site 05/07/2016 RIGHT RADIAL   Final  . Sample type 05/07/2016 ARTERIAL DRAW   Final  . Allens test (pass/fail) 05/07/2016 POSITIVE* PASS Final  . Mechanical Rate 05/07/2016 8   Final  . Magnesium 05/07/2016 2.3  1.7 - 2.4 mg/dL Final  . Phosphorus 05/07/2016 3.5  2.5 - 4.6 mg/dL Final  . Troponin I 05/06/2016 0.03* <0.03 ng/mL Final  . Troponin I 05/07/2016 0.04* <0.03 ng/mL Final  . Troponin I 05/07/2016 0.03* <0.03 ng/mL Final   Comment: CRITICAL VALUE NOTED. VALUE IS CONSISTENT WITH PREVIOUSLY REPORTED/CALLED VALUE DAS   . Hep B S Ab 05/08/2016 Non Reactive  Final   Comment: (NOTE)              Non Reactive: Inconsistent with immunity,                            less than 10 mIU/mL              Reactive:     Consistent with immunity,                            greater than 9.9 mIU/mL Performed At: [facility] [place], [place] [id number] Lindon Romp MD [id number]   . Hepatitis B Surface Ag 05/08/2016 Negative  Negative Final   Comment: (NOTE) Performed At: [facility] [place], [place] [id number] Lindon Romp MD [id number]   . MRSA by PCR 05/07/2016 NEGATIVE  NEGATIVE Final   Comment:        The GeneXpert MRSA Assay (FDA approved for NASAL specimens only), is one component of a comprehensive MRSA colonization surveillance program. It is not intended to diagnose MRSA infection nor to guide or monitor treatment  for MRSA infections.   . WBC 05/07/2016 16.4* 3.8 - 10.6 K/uL Final  . RBC 05/07/2016 3.30* 4.40 - 5.90 MIL/uL Final  . Hemoglobin 05/07/2016 8.9* 13.0 - 18.0 g/dL Final  . HCT 05/07/2016 27.9* 40.0 - 52.0 % Final  . MCV 05/07/2016 84.6  80.0 - 100.0 fL Final  . MCH 05/07/2016 26.9  26.0 - 34.0 pg Final  . MCHC 05/07/2016 31.8* 32.0 - 36.0 g/dL Final  . RDW 05/07/2016 18.4* 11.5 - 14.5 % Final  . Platelets 05/07/2016 135* 150 - 440 K/uL Final  . Glucose-Capillary 05/07/2016 132* 65 - 99 mg/dL Final  . WBC 05/09/2016 12.2* 3.8 - 10.6 K/uL Final  . RBC 05/09/2016 3.04* 4.40 - 5.90 MIL/uL Final  . Hemoglobin 05/09/2016 8.1* 13.0 - 18.0 g/dL Final  . HCT 05/09/2016 25.8* 40.0 - 52.0 % Final  . MCV 05/09/2016 84.9  80.0 - 100.0 fL Final  . MCH 05/09/2016 26.8  26.0 - 34.0 pg Final  . MCHC 05/09/2016 31.6* 32.0 - 36.0 g/dL Final  . RDW 05/09/2016 18.4* 11.5 - 14.5 % Final  . Platelets 05/09/2016 134* 150 - 440 K/uL Final  Outside labs from the dialysis center on 06/20/2015 revealed a hematocrit of 23.4 and hemoglobin of 7.8   Assessment:  [name] is a 75 y.o. male with end stage renal disease on dialysis since 06/2014.  The etiology of his renal disease is unclear.  He has stage IV prostate cancer.  PET scan on 06/2011 had revealed lymphadenopathy with sclerotic bone metastasis consistent with probable prostate cancer. There was bilateral hydronephrosis with bladder base thickening from probable prostate ingrowth. Bone scan 07/30/2011 revealed multiple areas consistent with metastatic disease.  He receives Lupron every 6 months.  PSA was 12 on 07/23/2011, 2.15 on 01/30/2015, and 1.44 on 06/08/2015.  He has a history of bladder cancer and is status post transurethral resection of bladder tumor (TURBT). He has stents in place.  He denies any hematuria.  He had a colonoscopy in 2012 which was negative. He is not felt to be a good candidate for sedation (further endoscopies) secondary  to his multiple morbidities.  Labs on 06/07/2014 revealed a ferritin of 6660 (prior value of 838 on 05/04/2015). Sedimentation rate was 80. Folate was  20.  Reticulocyte count was 3.4%, TSH was 1.07, ANA was negative.  Haptoglobin was 225 (normal), LDH 203 (98-192).  B12 was normal on 05/04/2015.  He was admitted to [facility] from 06/06/2015 - 06/10/2015 with symptomatic anemia. Hematocrit was 20.5 and hemoglobin of 6.4. He received 2 units of packed red blood cells.  CT angiogram of the pelvis on 06/09/2015 revealed a stable endovascular repair of an infrarenal abdominal aortic aneurysm with persistent findings of the type II endoleak supplied by the IMA with continued slight interval enlargement of the native abdominal aortic aneurysm sac (1-2 mm).  Hematocrit was 23.3 and hemoglobin 7.5 at discharge.   The etiology of his anemia is likely felt multifactorial. He has anemia of chronic renal disease.  By his history, he is now receiving Procrit. He may have some component of GI blood loss.  Diet is good. He denies any melena or hematochezia.  Hemoglobin was 7.8 on 06/20/2015 (stable to slightly improved).  Plan: 1. Review labs and evaluation in the hospital. 2. Discuss continued monitoring of HCT in dialysis.  Continue Procrit. 3. Nursing to contact Dr. Keturah Barre office- done. 4. If anemia remains problematic, check [place]/DAT, SPEP, FLCA. 5. RTC prn.   Lequita Asal, MD  06/23/2015, 11:44 AM

## 2016-05-09 NOTE — Discharge Summary (Signed)
[facility] [place] NAME: [name]    MR#:  [id number]  DATE OF BIRTH:  1941-02-15  DATE OF ADMISSION:  05/06/2016 ADMITTING PHYSICIAN: Vilinda Boehringer, MD  DATE OF DISCHARGE: 05/09/2016  PRIMARY CARE PHYSICIAN: Dion Body, MD    ADMISSION DIAGNOSIS:  Acute respiratory failure with hypoxia (HCC) [J96.01] Acute on chronic congestive heart failure, unspecified congestive heart failure type ([place]) [I50.9]  DISCHARGE DIAGNOSIS:  Active Problems:   Acute respiratory failure ([place])   SECONDARY DIAGNOSIS:   Past Medical History:  Diagnosis Date  . Allergic rhinitis   . Anemia    a. baseline hgb ~ 8  . Bladder cancer ([place])   . Chronic combined systolic and diastolic CHF (congestive heart failure) ([place])    a. echo 12/2014: EF 25-30%, anterior wall wall motion abnormalities; b. planned ischemic evaluation once patient is stable medically; c. echo 01/2015: EF 45-50%, no RWMA, mild AT, LA mildly dilated, mod pericardial effusion along LV free wall, no evidence of hemodynamic compromise  . Cognitive communication deficit   . COPD (chronic obstructive pulmonary disease) ([place])   . Dementia   . Detrusor sphincter dyssynergia   . ESRD on hemodialysis ([place])    a. Tuesday, Thursday, and Saturdays  . GERD (gastroesophageal reflux disease)   . History of septic shock    a. 01/2015  . History of small bowel obstruction    a. 01/2015  . Hydronephrosis   . Hypercholesteremia   . Hypertension   . 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 Incontinence   . Magnesium deficiency   . Mini stroke ([place])   . Overactive bladder   . PAF (paroxysmal atrial fibrillation) ([place])    a. new onset 12/2014 in the setting of UTI, sepsis, hypotension, and anemia; b. not on long term anticoagulation given anemia; c. family aware of stroke risk, they are ok with this; d. on amiodarone   . Prostate cancer [facility])     HOSPITAL COURSE:   75 year old male with past medical history  of end-stage renal disease on hemodialysis, paroxysmal atrial fibrillation, history of prostate cancer, COPD, chronic combined/diastolic CHF, GERD, history of small bowel obstruction who presented to the hospital due to shortness of breath and noted to be in acute respiratory failure.  1. Acute respiratory failure with hypoxia-this was secondary to volume overload and pulmonary edema also mild underlying COPD exacerbation. -Patient has been dialyzed 3 times over the past 4 days and his O2 requirements and clinical status are much improved.  He is now being discharged back to his SNF.   - he will Continue prednisone taper for his COPD. No evidence of pneumonia on chest x-ray.  2. End-stage renal disease on hemodialysis-patient normally gets dialysis on Tuesday, Thursday, Saturday. -He got one extra dialysis treatment due to his volume overload and has clinically improved. -He will resume his dialysis on Saturday of this week.  3. COPD exacerbation--patient received some IV steroids and then switched over to oral prednisone. -We will continue his DuoNeb's as needed, Symbicort, Spiriva upon discharge.  4. Atrial fibrillation-he is rate controlled. He will Continue amiodarone.  5. Hyperlipidemia- he will continue atorvastatin.  6. Secondary hyperparathyroidism-patient will resume his Renvela.  7. Glaucoma - cont. Latanoprost eye drops.   DISCHARGE CONDITIONS:   Stable.   CONSULTS OBTAINED:    DRUG ALLERGIES:   Allergies  Allergen Reactions  . Penicillins Hives, Itching and Swelling    Has patient had a PCN reaction causing  immediate rash, facial/tongue/throat swelling, SOB or lightheadedness with hypotension: Yes Has patient had a PCN reaction causing severe rash involving mucus membranes or skin necrosis: No Has patient had a PCN reaction that required hospitalization No Has patient had a PCN reaction occurring within the last 10 years: No If all of the above answers are "NO",  then may proceed with Cephalosporin use.    DISCHARGE MEDICATIONS:     Medication List    TAKE these medications   acetaminophen 325 MG tablet Commonly known as:  TYLENOL Take 650 mg by mouth every 8 (eight) hours as needed. For general discomfort   albuterol (2.5 MG/3ML) 0.083% nebulizer solution Commonly known as:  PROVENTIL Take 3 mLs (2.5 mg total) by nebulization every 4 (four) hours as needed for wheezing or shortness of breath.   amiodarone 200 MG tablet Commonly known as:  PACERONE Take 1 tablet (200 mg total) by mouth daily.   atorvastatin 40 MG tablet Commonly known as:  LIPITOR Take 40 mg by mouth at bedtime.   budesonide-formoterol 160-4.5 MCG/ACT inhaler Commonly known as:  SYMBICORT Inhale 2 puffs into the lungs 2 (two) times daily.   carvedilol 3.125 MG tablet Commonly known as:  COREG Take 1 tablet (3.125 mg total) by mouth 2 (two) times daily with a meal.   feeding supplement (PRO-STAT SUGAR FREE 64) Liqd Take 30 mLs by mouth daily with supper.   ferrous sulfate 325 (65 FE) MG tablet Take 325 mg by mouth daily.   guaifenesin 100 MG/5ML syrup Commonly known as:  ROBITUSSIN Take 200 mg by mouth every 4 (four) hours as needed for cough.   guaiFENesin 600 MG 12 hr tablet Commonly known as:  MUCINEX Take 600 mg by mouth 2 (two) times daily. (Take every morning and at bedtime for cough)   ipratropium-albuterol 0.5-2.5 (3) MG/3ML Soln Commonly known as:  DUONEB Take 3 mLs by nebulization 3 (three) times daily.   lamoTRIgine 25 MG tablet Commonly known as:  LAMICTAL Take 50 mg by mouth at bedtime. Start on July 14th.   latanoprost 0.005 % ophthalmic solution Commonly known as:  XALATAN Place 1 drop into both eyes at bedtime.   LORazepam 0.5 MG tablet Commonly known as:  ATIVAN Take 1 tablet (0.5 mg total) by mouth every 4 (four) hours as needed for anxiety.   magnesium oxide 400 MG tablet Commonly known as:  MAG-OX Take 1 tablet (400 mg total)  by mouth every evening.   ondansetron 8 MG tablet Commonly known as:  ZOFRAN Take by mouth every 8 (eight) hours as needed for nausea.   oxyCODONE 5 MG immediate release tablet Commonly known as:  Oxy IR/ROXICODONE Take 1 tablet (5 mg total) by mouth every 6 (six) hours as needed for moderate pain or severe pain.   OXYGEN Inhale 2-3 L into the lungs as needed. For shortness of breath, respiratory distress or O2 sats <90.   POLYETHYLENE GLYCOL 3350 PO Take 17 g by mouth daily as needed. For constipation. *Mix 1 capful in 6-8 ounces of beverage*   predniSONE 10 MG tablet Commonly known as:  DELTASONE Label  & dispense according to the schedule below. 4 Pills PO for 1 day, 3 Pills PO for 1 day, 2 Pills PO for 1 day, 1 Pill PO for 1 days then STOP.   sevelamer carbonate 800 MG tablet Commonly known as:  RENVELA Take 800 mg by mouth 3 (three) times daily before meals.   tiotropium 18 MCG inhalation capsule  Commonly known as:  SPIRIVA Place 1 capsule (18 mcg total) into inhaler and inhale daily.         DISCHARGE INSTRUCTIONS:   DIET:  Renal diet  DISCHARGE CONDITION:  Stable  ACTIVITY:  Activity as tolerated  OXYGEN:  Home Oxygen: Yes.     Oxygen Delivery: 2 liters/min via Patient connected to nasal cannula oxygen  DISCHARGE LOCATION:  nursing home   If you experience worsening of your admission symptoms, develop shortness of breath, life threatening emergency, suicidal or homicidal thoughts you must seek medical attention immediately by calling 911 or calling your MD immediately  if symptoms less severe.  You Must read complete instructions/literature along with all the possible adverse reactions/side effects for all the Medicines you take and that have been prescribed to you. Take any new Medicines after you have completely understood and accpet all the possible adverse reactions/side effects.   Please note  You were cared for by a hospitalist during your  hospital stay. If you have any questions about your discharge medications or the care you received while you were in the hospital after you are discharged, you can call the unit and asked to speak with the hospitalist on call if the hospitalist that took care of you is not available. Once you are discharged, your primary care physician will handle any further medical issues. Please note that NO REFILLS for any discharge medications will be authorized once you are discharged, as it is imperative that you return to your primary care physician (or establish a relationship with a primary care physician if you do not have one) for your aftercare needs so that they can reassess your need for medications and monitor your lab values.     Today   Pt. Seen at HD today and tolerating HD.  Constipation resolved and now having diarrhea.    VITAL SIGNS:  Blood pressure (!) 141/88, pulse 69, temperature 97.7 F (36.5 C), temperature source Oral, resp. rate (!) 24, height 6\' 1"  (1.854 m), weight 82.1 kg (181 lb), SpO2 100 %.  I/O:   Intake/Output Summary (Last 24 hours) at 05/09/16 1406 Last data filed at 05/09/16 1319  Gross per 24 hour  Intake              360 ml  Output             2500 ml  Net            -2140 ml    PHYSICAL EXAMINATION:   GENERAL:  74 y.o.-year-old patient lying in the bed in no acute distress.  EYES: Pupils equal, round, reactive to light and accommodation. No scleral icterus. Extraocular muscles intact.  HEENT: Head atraumatic, normocephalic. Oropharynx and nasopharynx clear.  NECK:  Supple, no jugular venous distention. No thyroid enlargement, no tenderness.  LUNGS: Normal breath sounds bilaterally, no wheezing, rhonchi, bibasilar rales. No use of accessory muscles of respiration.  CARDIOVASCULAR: S1, S2 normal. No murmurs, rubs, or gallops.  ABDOMEN: Soft, nontender, nondistended. Bowel sounds present. No organomegaly or mass.  EXTREMITIES: No cyanosis, clubbing or edema  b/l.    NEUROLOGIC: Cranial nerves II through XII are intact. No focal Motor or sensory deficits b/l.  Globally weak PSYCHIATRIC: The patient is alert and oriented x 3.  SKIN: No obvious rash, lesion, or ulcer.   Left upper Extremity AV fistula with good bruit and thrill.  DATA REVIEW:   CBC  Recent Labs Lab 05/09/16 0428  WBC 12.2*  HGB 8.1*  HCT 25.8*  PLT 134*    Chemistries   Recent Labs Lab 05/07/16 0345  NA 140  K 3.7  CL 101  CO2 30  GLUCOSE 153*  BUN 39*  CREATININE 4.10*  CALCIUM 8.3*  MG 2.3    Cardiac Enzymes  Recent Labs Lab 05/07/16 0901  TROPONINI 0.03*    Microbiology Results  Results for orders placed or performed during the hospital encounter of 05/06/16  MRSA PCR Screening     Status: None   Collection Time: 05/07/16  1:30 AM  Result Value Ref Range Status   MRSA by PCR NEGATIVE NEGATIVE Final    Comment:        The GeneXpert MRSA Assay (FDA approved for NASAL specimens only), is one component of a comprehensive MRSA colonization surveillance program. It is not intended to diagnose MRSA infection nor to guide or monitor treatment for MRSA infections.     RADIOLOGY:  No results found.    Management plans discussed with the patient, family and they are in agreement.  CODE STATUS:     Code Status Orders        Start     Ordered   05/06/16 2108  Full code  Continuous     05/06/16 2109    Code Status History    Date Active Date Inactive Code Status Order ID Comments User Context   03/30/2016  1:47 PM 04/04/2016  5:48 PM Full Code [id number]  Idelle Crouch, MD Inpatient   03/12/2016  3:28 PM 03/14/2016  9:36 PM Full Code [id number]  Henreitta Leber, MD Inpatient   10/19/2015  9:09 AM 10/21/2015  2:40 AM Full Code [id number]  Harrie Foreman, MD Inpatient   09/11/2015  9:55 AM 09/13/2015  6:51 PM Full Code [id number]  Gladstone Lighter, MD Inpatient   07/23/2015  3:03 PM 07/24/2015  9:39 PM Full Code [id number]  Vaughan Basta, MD Inpatient   06/06/2015  1:40 PM 06/10/2015  8:35 PM Full Code [id number]  Bettey Costa, MD ED   05/04/2015  6:59 PM 05/08/2015  6:09 PM Full Code [id number]  Loletha Grayer, MD ED   03/20/2015  6:35 PM 03/27/2015  8:00 PM Full Code [id number]  Loletha Grayer, MD ED   02/13/2015 10:04 PM 02/17/2015  8:15 PM Full Code [id number]  Hillary Bow, MD Inpatient   01/21/2015 11:45 PM 02/04/2015  9:31 PM Full Code [id number]  Dia Sitter, RN Inpatient      TOTAL TIME TAKING CARE OF THIS PATIENT: 40 minutes.    Henreitta Leber M.D on 05/09/2016 at 2:06 PM  Between 7am to 6pm - Pager - [phone]  After 6pm go to [email or web address] - Proofreader  [facility]  Office  [phone]  CC: Primary care physician; Dion Body, MD

## 2016-05-09 NOTE — Care Management Important Message (Signed)
Important Message  Patient Details  Name: [name] MRN: [id number] Date of Birth: July 04, 1941   Medicare Important Message Given:  Yes    Beverly Sessions, RN 05/09/2016, 4:15 PM

## 2016-05-09 NOTE — Progress Notes (Signed)
This note also relates to the following rows which could not be included: Pulse Rate - Cannot attach notes to unvalidated device data Resp - Cannot attach notes to unvalidated device data BP - Cannot attach notes to unvalidated device data SpO2 - Cannot attach notes to unvalidated device data   POST HD VITALS 

## 2016-05-09 NOTE — Progress Notes (Signed)
This note also relates to the following rows which could not be included: Pulse Rate - Cannot attach notes to unvalidated device data Resp - Cannot attach notes to unvalidated device data SpO2 - Cannot attach notes to unvalidated device data  END OF HD

## 2016-05-09 NOTE — Progress Notes (Signed)
Post hd assessment 

## 2016-05-09 NOTE — Progress Notes (Signed)
[facility]  ROUNDING NOTE   Subjective:  Patient seen and evaluated during hemodialysis. Overall shortness of breath has improved significantly. Patient had several bowel movements yesterday.  Objective:  Vital signs in last 24 hours:  Temp:  [97.5 F (36.4 C)-98.3 F (36.8 C)] 98.2 F (36.8 C) (08/17 0947) Pulse Rate:  [69-75] 73 (08/17 1130) Resp:  [12-28] 25 (08/17 1130) BP: (121-139)/(61-84) 139/77 (08/17 1130) SpO2:  [98 %-100 %] 100 % (08/17 1130) FiO2 (%):  [28 %] 28 % (08/17 0755) Weight:  [80.7 kg (178 lb)-82.1 kg (181 lb)] 82.1 kg (181 lb) (08/17 0947)  Weight change: -2.06 kg (-4 lb 8.7 oz) Filed Weights   05/07/16 2258 05/09/16 0500 05/09/16 0947  Weight: 80.5 kg (177 lb 7.5 oz) 80.7 kg (178 lb) 82.1 kg (181 lb)    Intake/Output: I/O last 3 completed shifts: In: 8 [P.O.:560] Out: 2500 [Other:2500]   Intake/Output this shift:  Total I/O In: 240 [P.O.:240] Out: -   Physical Exam: General: No acute distress  Head: Normocephalic, atraumatic. Moist oral mucosal membranes  Eyes: Anicteric  Neck: Supple, trachea midline  Lungs:  Basilar rales, normal effort  Heart: S1S2 no rubs  Abdomen:  Soft, nontender, Bowel sounds present   Extremities: Trace peripheral edema.  Neurologic: Nonfocal, moving all four extremities  Skin: No lesions  Access: LUE AVF    Basic Metabolic Panel:  Recent Labs Lab 05/06/16 1917 05/07/16 0345 05/09/16 0428  NA 139 140  --   K 4.2 3.7  --   CL 99* 101  --   CO2 29 30  --   GLUCOSE 118* 153*  --   BUN 64* 39*  --   CREATININE 5.98* 4.10*  --   CALCIUM 8.5* 8.3*  --   MG  --  2.3  --   PHOS  --  3.5 3.4    Liver Function Tests: No results for input(s): AST, ALT, ALKPHOS, BILITOT, PROT, ALBUMIN in the last 168 hours. No results for input(s): LIPASE, AMYLASE in the last 168 hours. No results for input(s): AMMONIA in the last 168 hours.  CBC:  Recent Labs Lab 05/06/16 1917 05/07/16 0345  05/09/16 0428  WBC 10.2 16.4* 12.2*  NEUTROABS 8.6*  --   --   HGB 9.4* 8.9* 8.1*  HCT 29.5* 27.9* 25.8*  MCV 85.8 84.6 84.9  PLT 224 135* 134*    Cardiac Enzymes:  Recent Labs Lab 05/06/16 1917 05/06/16 2132 05/07/16 0345 05/07/16 0901  TROPONINI 0.03* 0.03* 0.04* 0.03*    BNP: Invalid input(s): POCBNP  CBG:  Recent Labs Lab 05/07/16 0118  GLUCAP 132*    Microbiology: Results for orders placed or performed during the hospital encounter of 05/06/16  MRSA PCR Screening     Status: None   Collection Time: 05/07/16  1:30 AM  Result Value Ref Range Status   MRSA by PCR NEGATIVE NEGATIVE Final    Comment:        The GeneXpert MRSA Assay (FDA approved for NASAL specimens only), is one component of a comprehensive MRSA colonization surveillance program. It is not intended to diagnose MRSA infection nor to guide or monitor treatment for MRSA infections.     Coagulation Studies: No results for input(s): LABPROT, INR in the last 72 hours.  Urinalysis: No results for input(s): COLORURINE, LABSPEC, PHURINE, GLUCOSEU, HGBUR, BILIRUBINUR, KETONESUR, PROTEINUR, UROBILINOGEN, NITRITE, LEUKOCYTESUR in the last 72 hours.  Invalid input(s): APPERANCEUR    Imaging: No results found.   Medications:     .  amiodarone  200 mg Oral Daily  . atorvastatin  40 mg Oral q1800  . budesonide (PULMICORT) nebulizer solution  0.25 mg Nebulization BID  . ferrous sulfate  325 mg Oral Q breakfast  . heparin  5,000 Units Subcutaneous Q8H  . ipratropium-albuterol  3 mL Nebulization TID  . mometasone-formoterol  2 puff Inhalation BID  . predniSONE  40 mg Oral Q breakfast   Followed by  . [START ON 05/11/2016] predniSONE  30 mg Oral Q breakfast   Followed by  . [START ON 05/13/2016] predniSONE  20 mg Oral Q breakfast   Followed by  . [START ON 05/15/2016] predniSONE  10 mg Oral Q breakfast   Followed by  . [START ON 05/17/2016] predniSONE  10 mg Oral Q breakfast  . tiotropium  18  mcg Inhalation Daily   sodium chloride, acetaminophen, ALPRAZolam, bisacodyl, nitroGLYCERIN  Assessment/ Plan:  75 y.o. 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 ESRD on HD TTS: Admitted for volume overload/pulmonary edema. - Patient seen and evaluated during hemodialysis. Tolerating well at the moment. Complete dialysis today. Next hemodialysis on Saturday.  2. Acute systolic heart failure exacerbation: Most recent ejection fraction was 35-40%. He has now experienced another exacerbation of heart failure.  - Improved with extra dialysis this week. Ultrafiltration target 2-2.5 kg today.  3. Anemia of chronic kidney disease. We will start the patient back on Epogen 10,000 units IV with dialysis during the next treatment.   4. Secondary hyperparathyroidism. Phosphorus 3.4 today. Continue to monitor.  [name] 8/17/201711:45 AM

## 2016-05-09 NOTE — Progress Notes (Signed)
PRE HD ASSESSMENT 

## 2016-05-09 NOTE — Progress Notes (Signed)
Report attempted to be called to [facility]. Was on hold for five minutes. Will attempt to call again.

## 2016-05-09 NOTE — Progress Notes (Signed)
PRE HD INFO 

## 2016-05-09 NOTE — Clinical Social Work Note (Signed)
Physician discharging patient today to return to [facility]. Doug at [facility] is aware and discharge information sent. Nurse to call report. CSW called patient's contact: [name] (who is actually patient's cousin) and made her aware. Ms. [name] is in agreement with the discharge and stated that she is relieved he is better enough to discharge. Patient to transport via EMS.  [name] MSW,LCSW [phone]

## 2016-05-09 NOTE — Progress Notes (Signed)
HD START 

## 2016-05-09 NOTE — Progress Notes (Signed)
Report called to [facility]. EMS will transport.

## 2016-05-20 ENCOUNTER — Emergency Department: Payer: Medicare Other

## 2016-05-20 ENCOUNTER — Encounter: Payer: Self-pay | Admitting: Intensive Care

## 2016-05-20 ENCOUNTER — Inpatient Hospital Stay
Admission: EM | Admit: 2016-05-20 | Discharge: 2016-05-22 | DRG: 189 | Disposition: A | Discharge status: Skilled Nursing Facility | Payer: Medicare Other | Attending: Internal Medicine | Admitting: Internal Medicine

## 2016-05-20 DIAGNOSIS — Z8673 Personal history of transient ischemic attack (TIA), and cerebral infarction without residual deficits: Secondary | ICD-10-CM

## 2016-05-20 DIAGNOSIS — Z823 Family history of stroke: Secondary | ICD-10-CM | POA: Diagnosis not present

## 2016-05-20 DIAGNOSIS — J9601 Acute respiratory failure with hypoxia: Secondary | ICD-10-CM | POA: Diagnosis not present

## 2016-05-20 DIAGNOSIS — I132 Hypertensive heart and chronic kidney disease with heart failure and with stage 5 chronic kidney disease, or end stage renal disease: Secondary | ICD-10-CM | POA: Diagnosis present

## 2016-05-20 DIAGNOSIS — K219 Gastro-esophageal reflux disease without esophagitis: Secondary | ICD-10-CM | POA: Diagnosis present

## 2016-05-20 DIAGNOSIS — N2581 Secondary hyperparathyroidism of renal origin: Secondary | ICD-10-CM | POA: Diagnosis present

## 2016-05-20 DIAGNOSIS — Z9889 Other specified postprocedural states: Secondary | ICD-10-CM

## 2016-05-20 DIAGNOSIS — Z87891 Personal history of nicotine dependence: Secondary | ICD-10-CM | POA: Diagnosis not present

## 2016-05-20 DIAGNOSIS — Z992 Dependence on renal dialysis: Secondary | ICD-10-CM

## 2016-05-20 DIAGNOSIS — Z9981 Dependence on supplemental oxygen: Secondary | ICD-10-CM

## 2016-05-20 DIAGNOSIS — I480 Paroxysmal atrial fibrillation: Secondary | ICD-10-CM | POA: Diagnosis present

## 2016-05-20 DIAGNOSIS — D631 Anemia in chronic kidney disease: Secondary | ICD-10-CM | POA: Diagnosis present

## 2016-05-20 DIAGNOSIS — Z8551 Personal history of malignant neoplasm of bladder: Secondary | ICD-10-CM | POA: Diagnosis not present

## 2016-05-20 DIAGNOSIS — I5042 Chronic combined systolic (congestive) and diastolic (congestive) heart failure: Secondary | ICD-10-CM | POA: Diagnosis present

## 2016-05-20 DIAGNOSIS — J441 Chronic obstructive pulmonary disease with (acute) exacerbation: Secondary | ICD-10-CM | POA: Diagnosis not present

## 2016-05-20 DIAGNOSIS — F0390 Unspecified dementia without behavioral disturbance: Secondary | ICD-10-CM | POA: Diagnosis present

## 2016-05-20 DIAGNOSIS — Z8546 Personal history of malignant neoplasm of prostate: Secondary | ICD-10-CM | POA: Diagnosis not present

## 2016-05-20 DIAGNOSIS — N186 End stage renal disease: Secondary | ICD-10-CM | POA: Diagnosis present

## 2016-05-20 DIAGNOSIS — E46 Unspecified protein-calorie malnutrition: Secondary | ICD-10-CM | POA: Diagnosis present

## 2016-05-20 DIAGNOSIS — E785 Hyperlipidemia, unspecified: Secondary | ICD-10-CM | POA: Diagnosis present

## 2016-05-20 DIAGNOSIS — H409 Unspecified glaucoma: Secondary | ICD-10-CM | POA: Diagnosis present

## 2016-05-20 DIAGNOSIS — Z79899 Other long term (current) drug therapy: Secondary | ICD-10-CM | POA: Diagnosis not present

## 2016-05-20 DIAGNOSIS — K5900 Constipation, unspecified: Secondary | ICD-10-CM | POA: Diagnosis present

## 2016-05-20 DIAGNOSIS — Z880 Allergy status to penicillin: Secondary | ICD-10-CM

## 2016-05-20 DIAGNOSIS — C7951 Secondary malignant neoplasm of bone: Secondary | ICD-10-CM | POA: Diagnosis present

## 2016-05-20 LAB — CBC WITH DIFFERENTIAL/PLATELET
Basophils Absolute: 0 10*3/uL (ref 0–0.1)
Basophils Relative: 0 %
Eosinophils Absolute: 0.2 10*3/uL (ref 0–0.7)
Eosinophils Relative: 3 %
HEMATOCRIT: 27 % — AB (ref 40.0–52.0)
HEMOGLOBIN: 8.7 g/dL — AB (ref 13.0–18.0)
LYMPHS ABS: 1.1 10*3/uL (ref 1.0–3.6)
LYMPHS PCT: 11 %
MCH: 27.1 pg (ref 26.0–34.0)
MCHC: 32.3 g/dL (ref 32.0–36.0)
MCV: 83.9 fL (ref 80.0–100.0)
MONOS PCT: 16 %
Monocytes Absolute: 1.5 10*3/uL — ABNORMAL HIGH (ref 0.2–1.0)
NEUTROS ABS: 6.8 10*3/uL — AB (ref 1.4–6.5)
NEUTROS PCT: 70 %
Platelets: 134 10*3/uL — ABNORMAL LOW (ref 150–440)
RBC: 3.22 MIL/uL — AB (ref 4.40–5.90)
RDW: 18.5 % — ABNORMAL HIGH (ref 11.5–14.5)
WBC: 9.6 10*3/uL (ref 3.8–10.6)

## 2016-05-20 LAB — COMPREHENSIVE METABOLIC PANEL
ALK PHOS: 287 U/L — AB (ref 38–126)
ALT: 8 U/L — AB (ref 17–63)
AST: 14 U/L — ABNORMAL LOW (ref 15–41)
Albumin: 2.5 g/dL — ABNORMAL LOW (ref 3.5–5.0)
Anion gap: 14 (ref 5–15)
BILIRUBIN TOTAL: 1 mg/dL (ref 0.3–1.2)
BUN: 70 mg/dL — ABNORMAL HIGH (ref 6–20)
CALCIUM: 8 mg/dL — AB (ref 8.9–10.3)
CO2: 28 mmol/L (ref 22–32)
CREATININE: 5.62 mg/dL — AB (ref 0.61–1.24)
Chloride: 98 mmol/L — ABNORMAL LOW (ref 101–111)
GFR, EST AFRICAN AMERICAN: 10 mL/min — AB (ref >60)
GFR, EST NON AFRICAN AMERICAN: 9 mL/min — AB (ref >60)
Glucose, Bld: 99 mg/dL (ref 65–99)
Potassium: 4.3 mmol/L (ref 3.5–5.1)
SODIUM: 140 mmol/L (ref 135–145)
TOTAL PROTEIN: 5.4 g/dL — AB (ref 6.5–8.1)

## 2016-05-20 LAB — BRAIN NATRIURETIC PEPTIDE: B NATRIURETIC PEPTIDE 5: 3929 pg/mL — AB (ref 0.0–100.0)

## 2016-05-20 LAB — TROPONIN I: Troponin I: 0.04 ng/mL (ref <0.03)

## 2016-05-20 MED ORDER — GUAIFENESIN ER 600 MG PO TB12
600.0000 mg | ORAL_TABLET | Freq: Two times a day (BID) | ORAL | Status: DC
  Administered 2016-05-20 – 2016-05-22 (×4): 600 mg via ORAL
  Filled 2016-05-20 (×4): qty 1

## 2016-05-20 MED ORDER — CARVEDILOL 3.125 MG PO TABS
3.1250 mg | ORAL_TABLET | Freq: Two times a day (BID) | ORAL | Status: DC
  Administered 2016-05-20 – 2016-05-22 (×4): 3.125 mg via ORAL
  Filled 2016-05-20 (×4): qty 1

## 2016-05-20 MED ORDER — MAGNESIUM OXIDE 400 (241.3 MG) MG PO TABS
400.0000 mg | ORAL_TABLET | Freq: Every evening | ORAL | Status: DC
  Administered 2016-05-20 – 2016-05-21 (×2): 400 mg via ORAL
  Filled 2016-05-20 (×2): qty 1

## 2016-05-20 MED ORDER — SODIUM CHLORIDE 0.9 % IV SOLN: 250.0000 mL | INTRAVENOUS | Status: DC | PRN

## 2016-05-20 MED ORDER — METHYLPREDNISOLONE SODIUM SUCC 125 MG IJ SOLR
60.0000 mg | Freq: Four times a day (QID) | INTRAMUSCULAR | Status: DC
  Administered 2016-05-20 – 2016-05-22 (×6): 60 mg via INTRAVENOUS
  Filled 2016-05-20 (×6): qty 2

## 2016-05-20 MED ORDER — GUAIFENESIN 100 MG/5ML PO SOLN
200.0000 mg | ORAL | Status: DC | PRN
  Administered 2016-05-20 – 2016-05-21 (×2): 200 mg via ORAL
  Filled 2016-05-20 (×2): qty 10

## 2016-05-20 MED ORDER — IPRATROPIUM-ALBUTEROL 0.5-2.5 (3) MG/3ML IN SOLN
3.0000 mL | Freq: Once | RESPIRATORY_TRACT | Status: AC
  Administered 2016-05-20: 3 mL via RESPIRATORY_TRACT
  Filled 2016-05-20: qty 3

## 2016-05-20 MED ORDER — LATANOPROST 0.005 % OP SOLN
1.0000 [drp] | Freq: Every day | OPHTHALMIC | Status: DC
  Administered 2016-05-20 – 2016-05-21 (×2): 1 [drp] via OPHTHALMIC
  Filled 2016-05-20: qty 2.5

## 2016-05-20 MED ORDER — SODIUM CHLORIDE 0.9% FLUSH
3.0000 mL | Freq: Two times a day (BID) | INTRAVENOUS | Status: DC
  Administered 2016-05-20 – 2016-05-22 (×3): 3 mL via INTRAVENOUS

## 2016-05-20 MED ORDER — SEVELAMER CARBONATE 800 MG PO TABS
800.0000 mg | ORAL_TABLET | Freq: Three times a day (TID) | ORAL | Status: DC
  Administered 2016-05-20 – 2016-05-22 (×4): 800 mg via ORAL
  Filled 2016-05-20 (×4): qty 1

## 2016-05-20 MED ORDER — ATORVASTATIN CALCIUM 20 MG PO TABS
40.0000 mg | ORAL_TABLET | Freq: Every day | ORAL | Status: DC
  Administered 2016-05-20 – 2016-05-21 (×2): 40 mg via ORAL
  Filled 2016-05-20 (×2): qty 2

## 2016-05-20 MED ORDER — ENOXAPARIN SODIUM 30 MG/0.3ML ~~LOC~~ SOLN: 30.0000 mg | SUBCUTANEOUS | Status: DC

## 2016-05-20 MED ORDER — LORAZEPAM 0.5 MG PO TABS: 0.5000 mg | ORAL_TABLET | ORAL | Status: DC | PRN

## 2016-05-20 MED ORDER — OXYCODONE HCL 5 MG PO TABS
5.0000 mg | ORAL_TABLET | Freq: Four times a day (QID) | ORAL | Status: DC | PRN
  Administered 2016-05-20 – 2016-05-21 (×2): 5 mg via ORAL
  Filled 2016-05-20 (×2): qty 1

## 2016-05-20 MED ORDER — ALBUTEROL SULFATE (2.5 MG/3ML) 0.083% IN NEBU: 2.5000 mg | INHALATION_SOLUTION | RESPIRATORY_TRACT | Status: DC | PRN

## 2016-05-20 MED ORDER — FERROUS SULFATE 325 (65 FE) MG PO TABS
325.0000 mg | ORAL_TABLET | Freq: Every day | ORAL | Status: DC
  Administered 2016-05-21 – 2016-05-22 (×2): 325 mg via ORAL
  Filled 2016-05-20 (×2): qty 1

## 2016-05-20 MED ORDER — POLYETHYLENE GLYCOL 3350 17 GM/SCOOP PO POWD
1.0000 | Freq: Every day | ORAL | Status: DC | PRN
  Filled 2016-05-20: qty 255

## 2016-05-20 MED ORDER — DEXTROSE 5 % IV SOLN
500.0000 mg | INTRAVENOUS | Status: DC
  Administered 2016-05-20: 500 mg via INTRAVENOUS
  Filled 2016-05-20 (×2): qty 500

## 2016-05-20 MED ORDER — METHYLPREDNISOLONE SODIUM SUCC 125 MG IJ SOLR
125.0000 mg | Freq: Once | INTRAMUSCULAR | Status: AC
  Administered 2016-05-20: 125 mg via INTRAVENOUS
  Filled 2016-05-20: qty 2

## 2016-05-20 MED ORDER — MOMETASONE FURO-FORMOTEROL FUM 200-5 MCG/ACT IN AERO
2.0000 | INHALATION_SPRAY | Freq: Two times a day (BID) | RESPIRATORY_TRACT | Status: DC
  Administered 2016-05-20 – 2016-05-22 (×4): 2 via RESPIRATORY_TRACT
  Filled 2016-05-20: qty 8.8

## 2016-05-20 MED ORDER — LAMOTRIGINE 100 MG PO TABS
50.0000 mg | ORAL_TABLET | Freq: Every day | ORAL | Status: DC
  Administered 2016-05-20 – 2016-05-21 (×2): 50 mg via ORAL
  Filled 2016-05-20 (×2): qty 1

## 2016-05-20 MED ORDER — SODIUM CHLORIDE 0.9% FLUSH
3.0000 mL | Freq: Two times a day (BID) | INTRAVENOUS | Status: DC
  Administered 2016-05-20 – 2016-05-21 (×3): 3 mL via INTRAVENOUS

## 2016-05-20 MED ORDER — ONDANSETRON HCL 4 MG PO TABS: 4.0000 mg | ORAL_TABLET | Freq: Four times a day (QID) | ORAL | Status: DC | PRN

## 2016-05-20 MED ORDER — PRO-STAT SUGAR FREE PO LIQD
30.0000 mL | Freq: Every day | ORAL | Status: DC
  Administered 2016-05-20 – 2016-05-21 (×2): 30 mL via ORAL

## 2016-05-20 MED ORDER — IPRATROPIUM-ALBUTEROL 0.5-2.5 (3) MG/3ML IN SOLN
3.0000 mL | Freq: Four times a day (QID) | RESPIRATORY_TRACT | Status: DC
  Administered 2016-05-20 – 2016-05-22 (×6): 3 mL via RESPIRATORY_TRACT
  Filled 2016-05-20 (×7): qty 3

## 2016-05-20 MED ORDER — HEPARIN SODIUM (PORCINE) 5000 UNIT/ML IJ SOLN
5000.0000 [IU] | Freq: Two times a day (BID) | INTRAMUSCULAR | Status: DC
  Administered 2016-05-20 – 2016-05-22 (×3): 5000 [IU] via SUBCUTANEOUS
  Filled 2016-05-20 (×4): qty 1

## 2016-05-20 MED ORDER — DOCUSATE SODIUM 100 MG PO CAPS
100.0000 mg | ORAL_CAPSULE | Freq: Two times a day (BID) | ORAL | Status: DC
  Administered 2016-05-20 – 2016-05-22 (×4): 100 mg via ORAL
  Filled 2016-05-20 (×4): qty 1

## 2016-05-20 MED ORDER — SODIUM CHLORIDE 0.9% FLUSH: 3.0000 mL | INTRAVENOUS | Status: DC | PRN

## 2016-05-20 MED ORDER — TIOTROPIUM BROMIDE MONOHYDRATE 18 MCG IN CAPS
18.0000 ug | ORAL_CAPSULE | Freq: Every day | RESPIRATORY_TRACT | Status: DC
  Administered 2016-05-20 – 2016-05-22 (×3): 18 ug via RESPIRATORY_TRACT
  Filled 2016-05-20: qty 5

## 2016-05-20 MED ORDER — AMIODARONE HCL 200 MG PO TABS
200.0000 mg | ORAL_TABLET | Freq: Every day | ORAL | Status: DC
  Administered 2016-05-21 – 2016-05-22 (×2): 200 mg via ORAL
  Filled 2016-05-20 (×2): qty 1

## 2016-05-20 MED ORDER — ACETAMINOPHEN 325 MG PO TABS
650.0000 mg | ORAL_TABLET | Freq: Four times a day (QID) | ORAL | Status: DC | PRN
  Administered 2016-05-22: 650 mg via ORAL

## 2016-05-20 NOTE — Care Management (Addendum)
Patient was sent to the ED from [facility] for severe shortness of breath.  Patient at baseline uses 2 liters of 02.  Upon presentation to the ED  his 02 sats were 87% and 85%  on cpap and had to be changed to bipap.  Later informed that these two results on the flow sheet are not validated thus meaning they are incorrect.  Even so, he continues to be tachypneic and tacycardic on 2 liters.  Heart rate sustained > 100.  Respirations after almost 3 hours of treatment still in the 30's. Sats are improved on two liters but he still appears very short of breath and speaking broken sentences.  He resides at [facility] and is under a long term plan of care per [place].  Notified Doug of admission  and anticipate return when condition stabilizes.  He is a dialysis patient at [facility] on [address].  Notified Cheryl B with pathways of admission.  Patient is his own Media planner.  CSW referral.  Patient may benefit from palliative service at the skilled nursing facility due to high readmission rate.

## 2016-05-20 NOTE — ED Notes (Signed)
MD stated to this RN, take pt off bipap. Place on 2L o2

## 2016-05-20 NOTE — ED Triage Notes (Signed)
Patient arrived by EMS from [facility] in [place]. Patient is from [facility]. Patient was being seen today for constipation. Pt receives dialysis and last treatment was Saturday. Pt arrived on CPAP by EMS. Pt A&O x4

## 2016-05-20 NOTE — Clinical Social Work Note (Signed)
Clinical Social Work Assessment  Patient Details  Name: [name] MRN: [id number] Date of Birth: [birth year]  Date of referral:  05/20/16               Reason for consult:  Discharge Planning                Permission sought to share information with:  Facility Art therapist granted to share information::  Yes, Verbal Permission Granted  Name::        Agency::     Relationship::     Contact Information:     Housing/Transportation Living arrangements for the past 2 months:  [place] of Information:  Patient Patient Interpreter Needed:  None Criminal Activity/Legal Involvement Pertinent to Current Situation/Hospitalization:  No - Comment as needed Significant Relationships:  Other Family Members Lives with:  Facility Resident Do you feel safe going back to the place where you live?  Yes Need for family participation in patient care:  Yes (Comment)  Care giving concerns:  No care giving concerns identified at this time.   Social Worker assessment / plan:  CSW received consult because pt came from [facility]. CSW engaged with pt at his bedside. CSW introduced herself and her role as a Education officer, museum. CSW questioned if pt is satisfied with the care that he is receiving at [facility]. Pt states that he likes [facility] and is satisfied with the level of care that he is receiving there. However, pt does report that that there are 2 particular staff members that say rude things to him. Pt stated "They talk to me like I'm dumb." Pt denies any physical abuse, neglect, or poor care. Pt states that other than those 2 particular staff members, the staff at [facility] are nice and accomodating. Pt refused to disclose the names of the individual staff members and when asked if he would like CSW to speak to [employer] about his concerns pt declined. Per RN, pt will be admitted to the hospital, however his room number is currently  unknown. CSW will inform [facility] and continue to assist with pt as needed. After being admitted pt will then continue to be followed by a Medical CSW.  Employment status:  Retired Nurse, adult PT Recommendations:  Not assessed at this time Information / Referral to community resources:     Patient/Family's Response to care: Pt will be admitted to the hospital.  Patient/Family's Understanding of and Emotional Response to Diagnosis, Current Treatment, and Prognosis: Pt is appreciative of the care provided by CSW at this time.  Emotional Assessment Appearance:  Appears stated age Attitude/Demeanor/Rapport:  Other (Cooperative) Affect (typically observed):  Appropriate, Calm Orientation:  Oriented to Self, Oriented to Place, Oriented to  Time, Oriented to Situation Alcohol / Substance use:  Not Applicable Psych involvement (Current and /or in the community):  No (Comment)  Discharge Needs  Concerns to be addressed:  Care Coordination Readmission within the last 30 days:    Current discharge risk:  None Barriers to Discharge:  Continued Medical Work up   [employer], [place] 05/20/2016, 4:03 PM

## 2016-05-20 NOTE — Progress Notes (Signed)
Anticoagulation monitoring(Lovenox):  75 yo  male ordered Lovenox 30 mg Q24h  Filed Weights   05/20/16 1143  Weight: 181 lb (82.1 kg)   BMI    Lab Results  Component Value Date   CREATININE 5.62 (H) 05/20/2016   CREATININE 4.10 (H) 05/07/2016   CREATININE 5.98 (H) 05/06/2016   Estimated Creatinine Clearance: 13 mL/min (by C-G formula based on SCr of 5.62 mg/dL). Hemoglobin & Hematocrit     Component Value Date/Time   HGB 8.7 (L) 05/20/2016 1146   HGB 6.9 (L) 01/20/2015 0430   HCT 27.0 (L) 05/20/2016 1146   HCT 20.0 (L) 06/08/2015 1300     Per Protocol for Patient with estCrcl < 15 ml/min and BMI < 40, will transition to Heparin 5000 units SQ Q12H.

## 2016-05-20 NOTE — H&P (Signed)
[facility] [place] NAME: [name]    MR#:  [id number]  DATE OF BIRTH:  1941-04-22  DATE OF ADMISSION:  05/20/2016  PRIMARY CARE PHYSICIAN: Dion Body, MD   REQUESTING/REFERRING PHYSICIAN: Dr. Edd Fabian  CHIEF COMPLAINT:   Shortness of breath HISTORY OF PRESENT ILLNESS:  [name]  is a 75 y.o. male with a known history of COPD, chronic combined systolic and diastolic congestive heart failure, anemia, end-stage renal disease on hemodialysis on Tuesday, Thursday and Saturday, essential hypertension and multiple other medical problems was brought to the hospital from [place] home for shortness of breath. Patient at baseline lives on 2 L of oxygen but in the emergency department he was hypoxemic and pulse ox was at 85% on CPAP and eventually it was changed to BiPAP and IV steroids were given and NEB treatments were given and patient was found to be tachypneic. Eventually patient's likely started feeling better and during my examination he was resting comfortably but very shortness of breath with minimal exertion. Denies any chest pain. Reporting cough  PAST MEDICAL HISTORY:   Past Medical History:  Diagnosis Date  . Allergic rhinitis   . Anemia    a. baseline hgb ~ 8  . Bladder cancer ([place])   . Chronic combined systolic and diastolic CHF (congestive heart failure) ([place])    a. echo 12/2014: EF 25-30%, anterior wall wall motion abnormalities; b. planned ischemic evaluation once patient is stable medically; c. echo 01/2015: EF 45-50%, no RWMA, mild AT, LA mildly dilated, mod pericardial effusion along LV free wall, no evidence of hemodynamic compromise  . Cognitive communication deficit   . COPD (chronic obstructive pulmonary disease) ([place])   . Dementia   . Detrusor sphincter dyssynergia   . ESRD on hemodialysis ([place])    a. Tuesday, Thursday, and Saturdays  . GERD (gastroesophageal reflux disease)   .  History of septic shock    a. 01/2015  . History of small bowel obstruction    a. 01/2015  . Hydronephrosis   . Hypercholesteremia   . Hypertension   . 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 Incontinence   . Magnesium deficiency   . Mini stroke ([place])   . Overactive bladder   . PAF (paroxysmal atrial fibrillation) ([place])    a. new onset 12/2014 in the setting of UTI, sepsis, hypotension, and anemia; b. not on long term anticoagulation given anemia; c. family aware of stroke risk, they are ok with this; d. on amiodarone   . Prostate cancer ([place])     PAST SURGICAL HISTOIRY:   Past Surgical History:  Procedure Laterality Date  . AV FISTULA PLACEMENT     left arm  . CYSTOSCOPY W/ URETERAL STENT PLACEMENT    . PERIPHERAL VASCULAR CATHETERIZATION N/A 01/24/2016   Procedure: Endovascular Repair/Stent Graft;  Surgeon: Katha Cabal, MD;  Location: [facility];  Service: Cardiovascular;  Laterality: N/A;  . PROSTATE SURGERY     Removal  . TRANSURETHRAL RESECTION OF BLADDER TUMOR WITH GYRUS (TURBT-GYRUS)    . URETERAL STENT PLACEMENT      SOCIAL HISTORY:   Social History  Substance Use Topics  . Smoking status: Former Smoker    Packs/day: 0.25    Years: 56.00    Quit date: 05/13/2015  . Smokeless tobacco: Never Used  . Alcohol use No    FAMILY HISTORY:   Family History  Problem Relation Age of Onset  . Stroke  Mother     DRUG ALLERGIES:   Allergies  Allergen Reactions  . Penicillins Hives, Itching and Swelling    Has patient had a PCN reaction causing immediate rash, facial/tongue/throat swelling, SOB or lightheadedness with hypotension: Yes Has patient had a PCN reaction causing severe rash involving mucus membranes or skin necrosis: No Has patient had a PCN reaction that required hospitalization No Has patient had a PCN reaction occurring within the last 10 years: No If all of the above answers are "NO", then may proceed with Cephalosporin use.    REVIEW OF SYSTEMS:   CONSTITUTIONAL: No fever, fatigue or weakness.  EYES: No blurred or double vision.  EARS, NOSE, AND THROAT: No tinnitus or ear pain.  RESPIRATORY: Reporting productive cough, shortness of breath, wheezing . Denies hemoptysis.  CARDIOVASCULAR: No chest pain, orthopnea, edema.  GASTROINTESTINAL: No nausea, vomiting, diarrhea or abdominal pain.  GENITOURINARY: No dysuria, hematuria.  ENDOCRINE: No polyuria, nocturia,  HEMATOLOGY: No anemia, easy bruising or bleeding SKIN: No rash or lesion. MUSCULOSKELETAL: No joint pain or arthritis.   NEUROLOGIC: No tingling, numbness, weakness.  PSYCHIATRY: No anxiety or depression.   MEDICATIONS AT HOME:   Prior to Admission medications   Medication Sig Start Date End Date Taking? Authorizing Provider  albuterol (PROVENTIL) (2.5 MG/3ML) 0.083% nebulizer solution Take 3 mLs (2.5 mg total) by nebulization every 4 (four) hours as needed for wheezing or shortness of breath. 06/10/15  Yes Fritzi Mandes, MD  amiodarone (PACERONE) 200 MG tablet Take 1 tablet (200 mg total) by mouth daily. 09/13/15  Yes Gladstone Lighter, MD  atorvastatin (LIPITOR) 40 MG tablet Take 40 mg by mouth at bedtime.   Yes Historical Provider, MD  budesonide-formoterol (SYMBICORT) 160-4.5 MCG/ACT inhaler Inhale 2 puffs into the lungs 2 (two) times daily.   Yes Historical Provider, MD  carvedilol (COREG) 3.125 MG tablet Take 1 tablet (3.125 mg total) by mouth 2 (two) times daily with a meal. 03/14/16  Yes Gladstone Lighter, MD  ferrous sulfate 325 (65 FE) MG tablet Take 325 mg by mouth daily.   Yes Historical Provider, MD  guaiFENesin (MUCINEX) 600 MG 12 hr tablet Take 600 mg by mouth 2 (two) times daily. (Take every morning and at bedtime for cough)   Yes Historical Provider, MD  ipratropium-albuterol (DUONEB) 0.5-2.5 (3) MG/3ML SOLN Take 3 mLs by nebulization 3 (three) times daily.   Yes Historical Provider, MD  lamoTRIgine (LAMICTAL) 25 MG tablet Take 50 mg by mouth at bedtime. Start on  July 14th. 04/05/16  Yes Historical Provider, MD  latanoprost (XALATAN) 0.005 % ophthalmic solution Place 1 drop into both eyes at bedtime.   Yes Historical Provider, MD  LORazepam (ATIVAN) 0.5 MG tablet Take 1 tablet (0.5 mg total) by mouth every 4 (four) hours as needed for anxiety. 05/09/16  Yes Henreitta Leber, MD  magnesium oxide (MAG-OX) 400 MG tablet Take 1 tablet (400 mg total) by mouth every evening. 09/13/15  Yes Gladstone Lighter, MD  oxyCODONE (OXY IR/ROXICODONE) 5 MG immediate release tablet Take 1 tablet (5 mg total) by mouth every 6 (six) hours as needed for moderate pain or severe pain. 05/09/16  Yes Henreitta Leber, MD  POLYETHYLENE GLYCOL 3350 PO Take 17 g by mouth daily as needed. For constipation. *Mix 1 capful in 6-8 ounces of beverage*   Yes Historical Provider, MD  predniSONE (DELTASONE) 10 MG tablet Take 10 mg by mouth daily. 4 tablets given 05/10/2016 3 tablets given 05/11/2016 2 tablets given 05/12/2016 1 tablet  given   05/13/2016   Yes Historical Provider, MD  sevelamer carbonate (RENVELA) 800 MG tablet Take 800 mg by mouth 3 (three) times daily before meals.    Yes Historical Provider, MD  tiotropium (SPIRIVA) 18 MCG inhalation capsule Place 1 capsule (18 mcg total) into inhaler and inhale daily. 09/13/15  Yes Gladstone Lighter, MD  acetaminophen (TYLENOL) 325 MG tablet Take 650 mg by mouth every 8 (eight) hours as needed. For general discomfort    Historical Provider, MD  Amino Acids-Protein Hydrolys (FEEDING SUPPLEMENT, PRO-STAT SUGAR FREE 64,) LIQD Take 30 mLs by mouth daily with supper.    Historical Provider, MD  guaifenesin (ROBITUSSIN) 100 MG/5ML syrup Take 200 mg by mouth every 4 (four) hours as needed for cough.    Historical Provider, MD  ondansetron (ZOFRAN) 8 MG tablet Take by mouth every 8 (eight) hours as needed for nausea.    Historical Provider, MD  OXYGEN Inhale 2-3 L into the lungs as needed. For shortness of breath, respiratory distress or O2 sats <90.     Historical Provider, MD      VITAL SIGNS:  Blood pressure (!) 122/59, pulse (!) 107, temperature (!) 96.2 F (35.7 C), temperature source Axillary, resp. rate 16, height 6\' 1"  (1.854 m), weight 82.1 kg (181 lb), SpO2 100 %.  PHYSICAL EXAMINATION:  GENERAL:  75 y.o.-year-old patient lying in the bed with no acute distress.  EYES: Pupils equal, round, reactive to light and accommodation. No scleral icterus. Extraocular muscles intact.  HEENT: Head atraumatic, normocephalic. Oropharynx and nasopharynx clear.  NECK:  Supple, no jugular venous distention. No thyroid enlargement, no tenderness.  LUNGS: Diminished minimal breath sounds bilaterally,  wheezing, rales,rhonchi , no  crepitation. No use of accessory muscles of respiration.  CARDIOVASCULAR: S1, S2 normal. No murmurs, rubs, or gallops.  ABDOMEN: Soft, nontender, nondistended. Bowel sounds present. No organomegaly or mass.  EXTREMITIES: No pedal edema, cyanosis, or clubbing.  NEUROLOGIC: Cranial nerves II through XII are intact. Muscle strength 5/5 in all extremities. Sensation intact. Gait not checked.  PSYCHIATRIC: The patient is alert and oriented x 3.  SKIN: No obvious rash, lesion, or ulcer.   LABORATORY PANEL:   CBC  Recent Labs Lab 05/20/16 1146  WBC 9.6  HGB 8.7*  HCT 27.0*  PLT 134*   ------------------------------------------------------------------------------------------------------------------  Chemistries   Recent Labs Lab 05/20/16 1146  NA 140  K 4.3  CL 98*  CO2 28  GLUCOSE 99  BUN 70*  CREATININE 5.62*  CALCIUM 8.0*  AST 14*  ALT 8*  ALKPHOS 287*  BILITOT 1.0   ------------------------------------------------------------------------------------------------------------------  Cardiac Enzymes  Recent Labs Lab 05/20/16 1146  TROPONINI 0.04*   ------------------------------------------------------------------------------------------------------------------  RADIOLOGY:  Dg Abdomen 1  View  Result Date: 05/20/2016 CLINICAL DATA:  Constipation for 3 days. Last bowel movement 3 days ago. EXAM: ABDOMEN - 1 VIEW COMPARISON:  CTA runoff 01/23/2016.  Chest radiographs 05/20/2016. FINDINGS: Two erect views of the upper abdomen were obtained. The lower abdomen is not visualized. There are bilateral pleural effusions with bibasilar atelectasis and possible pulmonary edema. The heart is enlarged. No free intraperitoneal air is seen. The visualized bowel gas pattern is normal. Aortic stent graft is partially imaged. IMPRESSION: No evidence of pneumoperitoneum. The visualized bowel gas pattern is normal, although the entire abdomen is not imaged. Electronically Signed   By: Richardean Sale M.D.   On: 05/20/2016 13:54   Dg Chest Portable 1 View  Result Date: 05/20/2016 CLINICAL DATA:  Shortness of breath.  EXAM: PORTABLE CHEST 1 VIEW COMPARISON:  Radiograph of May 06, 2016. FINDINGS: Stable cardiomegaly. No pneumothorax is noted. Mild right basilar opacity is noted concerning for edema or atelectasis. No significant pleural effusion is noted. Minimal scarring or subsegmental atelectasis is noted in the left lower lobe. Bony thorax is unremarkable. IMPRESSION: Mild right basilar edema or subsegmental atelectasis. Minimal left basilar subsegmental atelectasis is noted. These are decreased compared to prior exam. Electronically Signed   By: Marijo Conception, M.D.   On: 05/20/2016 11:53    EKG:   Orders placed or performed during the hospital encounter of 05/20/16  . ED EKG  . ED EKG  . EKG 12-Lead  . EKG 12-Lead    IMPRESSION AND PLAN:   [name]  is a 75 y.o. male with a known history of COPD, chronic combined systolic and diastolic congestive heart failure, anemia, end-stage renal disease on hemodialysis on Tuesday, Thursday and Saturday, essential hypertension and multiple other medical problems was brought to the hospital from [place] home for shortness of breath.  Patient at baseline lives on 2 L of oxygen but in the emergency department he was hypoxemic and pulse ox was at 85% on CPAP and eventually it was changed to BiPAP and IV steroids   #Acute hypoxic respiratory failure with tachypnea secondary to COPD exacerbation Admit to telemetry Solu-Medrol 125 mg IV was given in the emergency department and patient was initially placed on BiPAP as he was hypoxemic and tachypneic, currently off BiPAP Continue Solu-Medrol 60 mg IV every 6 hours DuoNeb nebulizer treatments and albuterol as needed basis Azithromycin for anti-inflammatory effect BiPAP as needed Patient endorses that he quit smoking one year ago  #End stage renal disease on hemodialysis on Tuesday Thursday and Saturday Currently not fluid overloaded hyperkalemic Will continue hemodialysis starting from tomorrow. Nephrology consult is placed  #Essential hypertension Resume his home medications Coreg and amiodarone  #History of bladder cancer with bone metastases Outpatient follow-up with oncology as recommended  DVT prophylaxis with Lovenox subcutaneous    All the records are reviewed and case discussed with ED provider. Management plans discussed with the patient, And his cousin and they are in agreement.  CODE STATUS: fc/ cousin [name] is the HCPOA  TOTAL TIME TAKING CARE OF THIS PATIENT: 80minutes.   Note: This dictation was prepared with Dragon dictation along with smaller phrase technology. Any transcriptional errors that result from this process are unintentional.  Nicholes Mango M.D on 05/20/2016 at 2:39 PM  Between 7am to 6pm - Pager - [phone]  After 6pm go to [email or web address] - password [email or web address]  Office  [phone]  CC: Primary care physician; Dion Body, MD

## 2016-05-20 NOTE — ED Provider Notes (Signed)
[facility] Emergency Department Provider Note   ____________________________________________   First MD Initiated Contact with Patient 05/20/16 1134     (approximate)  I have reviewed the triage vital signs and the nursing notes.   HISTORY  Chief Complaint Respiratory Distress  Caveat-history of present illness review of systems Limited due to the patient's severe respiratory distress.  HPI [name] is a 75 y.o. 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 According to EMS report, the patient was pre-him evaluated at the GI clinic for constipation where he was noted to be severely short of breath. He was placed on CPAP in route to [facility] emergency department. Patient denies any chest pain, he has had cough, he denies any fevers or chills.   Past Medical History:  Diagnosis Date  . Allergic rhinitis   . Anemia    a. baseline hgb ~ 8  . Bladder cancer ([place])   . Chronic combined systolic and diastolic CHF (congestive heart failure) ([place])    a. echo 12/2014: EF 25-30%, anterior wall wall motion abnormalities; b. planned ischemic evaluation once patient is stable medically; c. echo 01/2015: EF 45-50%, no RWMA, mild AT, LA mildly dilated, mod pericardial effusion along LV free wall, no evidence of hemodynamic compromise  . Cognitive communication deficit   . COPD (chronic obstructive pulmonary disease) ([place])   . Dementia   . Detrusor sphincter dyssynergia   . ESRD on hemodialysis ([place])    a. Tuesday, Thursday, and Saturdays  . GERD (gastroesophageal reflux disease)   . History of septic shock    a. 01/2015  . History of small bowel  obstruction    a. 01/2015  . Hydronephrosis   . Hypercholesteremia   . Hypertension   . 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 Incontinence   . Magnesium deficiency   . Mini stroke ([place])   . Overactive bladder   . PAF (paroxysmal atrial fibrillation) ([place])    a. new onset 12/2014 in the setting of UTI, sepsis, hypotension, and anemia; b. not on long term anticoagulation given anemia; c. family aware of stroke risk, they are ok with this; d. on amiodarone   . Prostate cancer [facility])     Patient Active Problem List   Diagnosis Date Noted  . Acute respiratory failure with hypoxia ([place]) 05/20/2016  . Acute respiratory failure ([place]) 05/06/2016  . History of prostate cancer 02/12/2016  . COPD (chronic obstructive pulmonary disease) ([place])   . 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 COPD (chronic obstructive pulmonary disease) with chronic bronchitis ([place]) 11/06/2015  . Chronic diastolic heart failure ([place]) 08/11/2015  . Generalized weakness 05/08/2015  . Other emphysema ([place]) 05/08/2015  . Tobacco use disorder 05/08/2015  . Hypokalemia 05/08/2015  . Pressure ulcer 05/05/2015  . Symptomatic anemia 05/04/2015  . CAFL (chronic airflow limitation) ([place]) 04/10/2015  . Malnutrition of moderate degree ([place]) 03/22/2015  . Incontinence 02/27/2015  . Persistent atrial fibrillation ([place]) 02/27/2015  . Preoperative cardiovascular examination 02/27/2015  . ESRD on hemodialysis ([place])   . Chronic kidney disease (CKD), stage IV (severe) ([place]) 03/01/2014  . Essential (primary) hypertension 03/01/2014  . 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 Cystitis 11/11/2012  . Hydronephrosis 08/27/2012  .  Bladder cancer ([place]) 08/27/2012  . CA of prostate ([place]) 08/27/2012  . Bone metastases ([place]) 08/27/2012    Past Surgical History:  Procedure Laterality Date  . AV FISTULA PLACEMENT     left arm  . CYSTOSCOPY W/ URETERAL STENT PLACEMENT    . PERIPHERAL VASCULAR CATHETERIZATION  N/A 01/24/2016   Procedure: Endovascular Repair/Stent Graft;  Surgeon: Katha Cabal, MD;  Location: [place] CV LAB;  Service: Cardiovascular;  Laterality: N/A;  . PROSTATE SURGERY     Removal  . TRANSURETHRAL RESECTION OF BLADDER TUMOR WITH GYRUS (TURBT-GYRUS)    . URETERAL STENT PLACEMENT      Prior to Admission medications   Medication Sig Start Date End Date Taking? Authorizing Provider  albuterol (PROVENTIL) (2.5 MG/3ML) 0.083% nebulizer solution Take 3 mLs (2.5 mg total) by nebulization every 4 (four) hours as needed for wheezing or shortness of breath. 06/10/15  Yes Fritzi Mandes, MD  amiodarone (PACERONE) 200 MG tablet Take 1 tablet (200 mg total) by mouth daily. 09/13/15  Yes Gladstone Lighter, MD  atorvastatin (LIPITOR) 40 MG tablet Take 40 mg by mouth at bedtime.   Yes Historical Provider, MD  budesonide-formoterol (SYMBICORT) 160-4.5 MCG/ACT inhaler Inhale 2 puffs into the lungs 2 (two) times daily.   Yes Historical Provider, MD  carvedilol (COREG) 3.125 MG tablet Take 1 tablet (3.125 mg total) by mouth 2 (two) times daily with a meal. 03/14/16  Yes Gladstone Lighter, MD  ferrous sulfate 325 (65 FE) MG tablet Take 325 mg by mouth daily.   Yes Historical Provider, MD  guaiFENesin (MUCINEX) 600 MG 12 hr tablet Take 600 mg by mouth 2 (two) times daily. (Take every morning and at bedtime for cough)   Yes Historical Provider, MD  ipratropium-albuterol (DUONEB) 0.5-2.5 (3) MG/3ML SOLN Take 3 mLs by nebulization 3 (three) times daily.   Yes Historical Provider, MD  lamoTRIgine (LAMICTAL) 25 MG tablet Take 50 mg by mouth at bedtime. Start on July 14th. 04/05/16  Yes Historical Provider, MD  latanoprost (XALATAN) 0.005 % ophthalmic solution Place 1 drop into both eyes at bedtime.   Yes Historical Provider, MD  LORazepam (ATIVAN) 0.5 MG tablet Take 1 tablet (0.5 mg total) by mouth every 4 (four) hours as needed for anxiety. 05/09/16  Yes Henreitta Leber, MD  magnesium oxide (MAG-OX) 400 MG  tablet Take 1 tablet (400 mg total) by mouth every evening. 09/13/15  Yes Gladstone Lighter, MD  oxyCODONE (OXY IR/ROXICODONE) 5 MG immediate release tablet Take 1 tablet (5 mg total) by mouth every 6 (six) hours as needed for moderate pain or severe pain. 05/09/16  Yes Henreitta Leber, MD  POLYETHYLENE GLYCOL 3350 PO Take 17 g by mouth daily as needed. For constipation. *Mix 1 capful in 6-8 ounces of beverage*   Yes Historical Provider, MD  predniSONE (DELTASONE) 10 MG tablet Take 10 mg by mouth daily. 4 tablets given 05/10/2016 3 tablets given 05/11/2016 2 tablets given 05/12/2016 1 tablet given   05/13/2016   Yes Historical Provider, MD  sevelamer carbonate (RENVELA) 800 MG tablet Take 800 mg by mouth 3 (three) times daily before meals.    Yes Historical Provider, MD  tiotropium (SPIRIVA) 18 MCG inhalation capsule Place 1 capsule (18 mcg total) into inhaler and inhale daily. 09/13/15  Yes Gladstone Lighter, MD  acetaminophen (TYLENOL) 325 MG tablet Take 650 mg by mouth every 8 (eight) hours as needed. For general discomfort    Historical Provider, MD  Amino Acids-Protein Hydrolys (FEEDING SUPPLEMENT,  PRO-STAT SUGAR FREE 64,) LIQD Take 30 mLs by mouth daily with supper.    Historical Provider, MD  guaifenesin (ROBITUSSIN) 100 MG/5ML syrup Take 200 mg by mouth every 4 (four) hours as needed for cough.    Historical Provider, MD  ondansetron (ZOFRAN) 8 MG tablet Take by mouth every 8 (eight) hours as needed for nausea.    Historical Provider, MD  OXYGEN Inhale 2-3 L into the lungs as needed. For shortness of breath, respiratory distress or O2 sats <90.    Historical Provider, MD    Allergies Penicillins  Family History  Problem Relation Age of Onset  . Stroke Mother     Social History Social History  Substance Use Topics  . Smoking status: Former Smoker    Packs/day: 0.25    Years: 56.00    Quit date: 05/13/2015  . Smokeless tobacco: Never Used  . Alcohol use No    Review of  Systems  Respiratory: + shortness of breath.   Caveat-history of present illness review of systems Limited due to the patient's severe respiratory distress. ____________________________________________   PHYSICAL EXAM:  Vitals:   05/20/16 1230 05/20/16 1245 05/20/16 1300 05/20/16 1430  BP: (!) 133/57  (!) 122/59 123/82  Pulse:  (!) 106 (!) 107 (!) 108  Resp: (!) 26 (!) 28 16 (!) 21  Temp:      TempSrc:      SpO2:  97% 100% 100%  Weight:      Height:        VITAL SIGNS: ED Triage Vitals  Enc Vitals Group     BP      Pulse      Resp      Temp      Temp src      SpO2      Weight      Height      Head Circumference      Peak Flow      Pain Score      Pain Loc      Pain Edu?      Excl. in [place]?     Constitutional: Alert and oriented. In moderate respiratory distress able to speak in short sentences. Eyes: Conjunctivae are normal. PERRL. EOMI. Head: Atraumatic. Nose: No congestion/rhinnorhea. Mouth/Throat: Mucous membranes are moist.  Oropharynx non-erythematous. Neck: No stridor.   Cardiovascular: Normal rate, regular rhythm. Grossly normal heart sounds.  Good peripheral circulation. Respiratory: Tachypneic with increased work of breathing, diminished breath sounds in bilateral bases, crackles. Gastrointestinal: Soft and nontender. No distention.  No CVA tenderness. Genitourinary: deferred Musculoskeletal: Faint nonpitting edema bilateral lower extremities. No joint effusions. Neurologic:  Normal speech and language. No gross focal neurologic deficits are appreciated.  Skin:  Skin is warm, dry and intact. No rash noted. Psychiatric: Mood and affect are normal. Speech and behavior are normal.  ____________________________________________   LABS (all labs ordered are listed, but only abnormal results are displayed)  Labs Reviewed  CBC WITH DIFFERENTIAL/PLATELET - Abnormal; Notable for the following:       Result Value   RBC 3.22 (*)    Hemoglobin 8.7 (*)     HCT 27.0 (*)    RDW 18.5 (*)    Platelets 134 (*)    Neutro Abs 6.8 (*)    Monocytes Absolute 1.5 (*)    All other components within normal limits  COMPREHENSIVE METABOLIC PANEL - Abnormal; Notable for the following:    Chloride 98 (*)    BUN 70 (*)  Creatinine, Ser 5.62 (*)    Calcium 8.0 (*)    Total Protein 5.4 (*)    Albumin 2.5 (*)    AST 14 (*)    ALT 8 (*)    Alkaline Phosphatase 287 (*)    GFR calc non Af Amer 9 (*)    GFR calc Af Amer 10 (*)    All other components within normal limits  TROPONIN I - Abnormal; Notable for the following:    Troponin I 0.04 (*)    All other components within normal limits  BRAIN NATRIURETIC PEPTIDE - Abnormal; Notable for the following:    B Natriuretic Peptide 3,929.0 (*)    All other components within normal limits  BLOOD GAS, ARTERIAL - Abnormal; Notable for the following:    pO2, Arterial 415 (*)    Bicarbonate 31.9 (*)    Acid-Base Excess 6.8 (*)    All other components within normal limits  CULTURE, BLOOD (ROUTINE X 2)  CULTURE, BLOOD (ROUTINE X 2)   ____________________________________________  EKG  ED ECG REPORT I, Joanne Gavel, the attending physician, personally viewed and interpreted this ECG.   Date: 05/20/2016  EKG Time: 11:36  Rate: 111  Rhythm: atrial tachycardia  Axis: nromal  Intervals: Nonespecific intraventricular conduction delay.  ST&T Change: No acute ST elevation or acute ST depression. Nonspecific T-wave abnormality.  ____________________________________________  RADIOLOGY  CXR IMPRESSION:  Mild right basilar edema or subsegmental atelectasis. Minimal left  basilar subsegmental atelectasis is noted. These are decreased  compared to prior exam.      ____________________________________________   PROCEDURES  Procedure(s) performed: None  Procedures  Critical Care performed: Yes, see critical care note(s).  CRITICAL CARE Performed by: Loura Pardon A   Total critical care time:  35 minutes  Critical care time was exclusive of separately billable procedures and treating other patients.  Critical care was necessary to treat or prevent imminent or life-threatening deterioration.  Critical care was time spent personally by me on the following activities: development of treatment plan with patient and/or surrogate as well as nursing, discussions with consultants, evaluation of patient's response to treatment, examination of patient, obtaining history from patient or surrogate, ordering and performing treatments and interventions, ordering and review of laboratory studies, ordering and review of radiographic studies, pulse oximetry and re-evaluation of patient's condition.  ____________________________________________   INITIAL IMPRESSION / ASSESSMENT AND PLAN / ED COURSE  Pertinent labs & imaging results that were available during my care of the patient were reviewed by me and considered in my medical decision making (see chart for details).  [name] is a 75 y.o. 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 On exam he is in moderate respiratory distress, speaking in short sentences, denies breath sounds in bilateral bases with some crackles, no wheeze, no stridor. He is mildly tachycardic over the remainder is vital signs are stable and he is afebrile. Possible CHF exacerbation versus combined CHF and COPD ED exacerbation given his history of end-stage COPD, will treat accordingly with nebulizer treatments, steroids, obtain chest x-ray, reassess for disposition. Of note, he was last dialyzed 2 days ago and received his full dialysis treatment.  ----------------------------------------- 12:25 PM on  05/20/2016 ----------------------------------------- Received a phone call from the patient's nurse practitioner at his SNF who reports that  this is his baseline and that his shortness of breath is ongoing, he has a PICC line in the right arm through which he was recently receiving fluids however she also notes that he has been noncompliant with his fluid restriction. We will trial him off of BiPAP as he appears much more comfortable.  ----------------------------------------- 2:02 PM on 05/20/2016 ----------------------------------------- CXR improved from prior. Patient remains off BiPAP, he appears much were comfortable but still intermittently tachypneic with a respiratory rate of 33 respirations per minute, still mildly tachycardic, given ongoing increased work of breathing and tachypnea, I discussed the case with the hospitalist for admission.  Clinical Course     ____________________________________________   FINAL CLINICAL IMPRESSION(S) / ED DIAGNOSES  Final diagnoses:  COPD with acute exacerbation ([place])      NEW MEDICATIONS STARTED DURING THIS VISIT:  New Prescriptions   No medications on file     Note:  This document was prepared using Dragon voice recognition software and may include unintentional dictation errors.    Joanne Gavel, MD 05/20/16 1535

## 2016-05-21 LAB — CBC
HCT: 24.7 % — ABNORMAL LOW (ref 40.0–52.0)
Hemoglobin: 8 g/dL — ABNORMAL LOW (ref 13.0–18.0)
MCH: 27.4 pg (ref 26.0–34.0)
MCHC: 32.6 g/dL (ref 32.0–36.0)
MCV: 84 fL (ref 80.0–100.0)
PLATELETS: 125 10*3/uL — AB (ref 150–440)
RBC: 2.94 MIL/uL — AB (ref 4.40–5.90)
RDW: 18 % — ABNORMAL HIGH (ref 11.5–14.5)
WBC: 5.3 10*3/uL (ref 3.8–10.6)

## 2016-05-21 LAB — BLOOD CULTURE ID PANEL (REFLEXED)
ACINETOBACTER BAUMANNII: NOT DETECTED
CANDIDA ALBICANS: NOT DETECTED
CANDIDA GLABRATA: NOT DETECTED
Candida krusei: NOT DETECTED
Candida parapsilosis: NOT DETECTED
Candida tropicalis: NOT DETECTED
ENTEROBACTER CLOACAE COMPLEX: NOT DETECTED
ENTEROBACTERIACEAE SPECIES: NOT DETECTED
ENTEROCOCCUS SPECIES: NOT DETECTED
Escherichia coli: NOT DETECTED
Haemophilus influenzae: NOT DETECTED
Klebsiella oxytoca: NOT DETECTED
Klebsiella pneumoniae: NOT DETECTED
LISTERIA MONOCYTOGENES: NOT DETECTED
Methicillin resistance: DETECTED — AB
NEISSERIA MENINGITIDIS: NOT DETECTED
PSEUDOMONAS AERUGINOSA: NOT DETECTED
Proteus species: NOT DETECTED
STREPTOCOCCUS AGALACTIAE: NOT DETECTED
STREPTOCOCCUS PYOGENES: NOT DETECTED
STREPTOCOCCUS SPECIES: NOT DETECTED
Serratia marcescens: NOT DETECTED
Staphylococcus aureus (BCID): NOT DETECTED
Staphylococcus species: DETECTED — AB
Streptococcus pneumoniae: NOT DETECTED

## 2016-05-21 LAB — BASIC METABOLIC PANEL
ANION GAP: 15 (ref 5–15)
BUN: 83 mg/dL — ABNORMAL HIGH (ref 6–20)
CO2: 27 mmol/L (ref 22–32)
Calcium: 7.6 mg/dL — ABNORMAL LOW (ref 8.9–10.3)
Chloride: 99 mmol/L — ABNORMAL LOW (ref 101–111)
Creatinine, Ser: 6.44 mg/dL — ABNORMAL HIGH (ref 0.61–1.24)
GFR calc Af Amer: 9 mL/min — ABNORMAL LOW (ref >60)
GFR, EST NON AFRICAN AMERICAN: 8 mL/min — AB (ref >60)
GLUCOSE: 218 mg/dL — AB (ref 65–99)
POTASSIUM: 4.5 mmol/L (ref 3.5–5.1)
SODIUM: 141 mmol/L (ref 135–145)

## 2016-05-21 LAB — GLUCOSE, CAPILLARY
GLUCOSE-CAPILLARY: 255 mg/dL — AB (ref 65–99)
Glucose-Capillary: 215 mg/dL — ABNORMAL HIGH (ref 65–99)

## 2016-05-21 LAB — BLOOD GAS, ARTERIAL
Acid-Base Excess: 6.8 mmol/L — ABNORMAL HIGH (ref 0.0–3.0)
Allens test (pass/fail): POSITIVE — AB
Bicarbonate: 31.9 mEq/L — ABNORMAL HIGH (ref 21.0–28.0)
DELIVERY SYSTEMS: POSITIVE
EXPIRATORY PAP: 8
FIO2: 1
INSPIRATORY PAP: 16
O2 Saturation: 100 %
PCO2 ART: 48 mmHg (ref 32.0–48.0)
PH ART: 7.43 (ref 7.350–7.450)
Patient temperature: 37
pO2, Arterial: 415 mmHg — ABNORMAL HIGH (ref 83.0–108.0)

## 2016-05-21 MED ORDER — LEVOFLOXACIN 500 MG PO TABS
500.0000 mg | ORAL_TABLET | Freq: Once | ORAL | Status: AC
  Administered 2016-05-21: 500 mg via ORAL
  Filled 2016-05-21 (×2): qty 1

## 2016-05-21 MED ORDER — LEVOFLOXACIN 500 MG PO TABS: 250.0000 mg | ORAL_TABLET | ORAL | Status: DC

## 2016-05-21 MED ORDER — ACETYLCYSTEINE 20 % IN SOLN
4.0000 mL | Freq: Two times a day (BID) | RESPIRATORY_TRACT | Status: DC
  Administered 2016-05-21 – 2016-05-22 (×2): 4 mL via RESPIRATORY_TRACT
  Filled 2016-05-21 (×3): qty 4

## 2016-05-21 MED ORDER — LEVOFLOXACIN 500 MG PO TABS: 500.0000 mg | ORAL_TABLET | ORAL | Status: DC

## 2016-05-21 MED ORDER — VANCOMYCIN HCL 10 G IV SOLR
1750.0000 mg | Freq: Once | INTRAVENOUS | Status: AC
  Administered 2016-05-21: 1750 mg via INTRAVENOUS
  Filled 2016-05-21: qty 1750

## 2016-05-21 MED ORDER — EPOETIN ALFA 10000 UNIT/ML IJ SOLN
4000.0000 [IU] | INTRAMUSCULAR | Status: DC
  Administered 2016-05-21: 4000 [IU] via INTRAVENOUS

## 2016-05-21 MED ORDER — LEVOFLOXACIN 500 MG PO TABS: 500.0000 mg | ORAL_TABLET | Freq: Every day | ORAL | Status: DC

## 2016-05-21 MED ORDER — INSULIN ASPART 100 UNIT/ML ~~LOC~~ SOLN
0.0000 [IU] | Freq: Every day | SUBCUTANEOUS | Status: DC
Start: 2016-05-21 — End: 2016-05-22
  Administered 2016-05-21: 3 [IU] via SUBCUTANEOUS
  Filled 2016-05-21: qty 3
  Filled 2016-05-21 (×2): qty 0.05

## 2016-05-21 MED ORDER — VANCOMYCIN HCL IN DEXTROSE 750-5 MG/150ML-% IV SOLN: 750.0000 mg | INTRAVENOUS | Status: DC

## 2016-05-21 MED ORDER — INSULIN ASPART 100 UNIT/ML ~~LOC~~ SOLN
0.0000 [IU] | Freq: Three times a day (TID) | SUBCUTANEOUS | Status: DC
Start: 2016-05-21 — End: 2016-05-22
  Administered 2016-05-21 – 2016-05-22 (×2): 3 [IU] via SUBCUTANEOUS
  Filled 2016-05-21 (×3): qty 0.09
  Filled 2016-05-21: qty 3
  Filled 2016-05-21: qty 0.09
  Filled 2016-05-21: qty 3

## 2016-05-21 NOTE — Progress Notes (Signed)
This note also relates to the following rows which could not be included: Pulse Rate - Cannot attach notes to unvalidated device data Resp - Cannot attach notes to unvalidated device data SpO2 - Cannot attach notes to unvalidated device data  HD START

## 2016-05-21 NOTE — Progress Notes (Signed)
PHARMACY - PHYSICIAN COMMUNICATION CRITICAL VALUE ALERT - BLOOD CULTURE IDENTIFICATION (BCID)  Results for orders placed or performed during the hospital encounter of 05/20/16  Blood Culture ID Panel (Reflexed) (Collected: 05/20/2016 11:46 AM)  Result Value Ref Range   Enterococcus species NOT DETECTED NOT DETECTED   Listeria monocytogenes NOT DETECTED NOT DETECTED   Staphylococcus species DETECTED (A) NOT DETECTED   Staphylococcus aureus NOT DETECTED NOT DETECTED   Methicillin resistance DETECTED (A) NOT DETECTED   Streptococcus species NOT DETECTED NOT DETECTED   Streptococcus agalactiae NOT DETECTED NOT DETECTED   Streptococcus pneumoniae NOT DETECTED NOT DETECTED   Streptococcus pyogenes NOT DETECTED NOT DETECTED   Acinetobacter baumannii NOT DETECTED NOT DETECTED   Enterobacteriaceae species NOT DETECTED NOT DETECTED   Enterobacter cloacae complex NOT DETECTED NOT DETECTED   Escherichia coli NOT DETECTED NOT DETECTED   Klebsiella oxytoca NOT DETECTED NOT DETECTED   Klebsiella pneumoniae NOT DETECTED NOT DETECTED   Proteus species NOT DETECTED NOT DETECTED   Serratia marcescens NOT DETECTED NOT DETECTED   Haemophilus influenzae NOT DETECTED NOT DETECTED   Neisseria meningitidis NOT DETECTED NOT DETECTED   Pseudomonas aeruginosa NOT DETECTED NOT DETECTED   Candida albicans NOT DETECTED NOT DETECTED   Candida glabrata NOT DETECTED NOT DETECTED   Candida krusei NOT DETECTED NOT DETECTED   Candida parapsilosis NOT DETECTED NOT DETECTED   Candida tropicalis NOT DETECTED NOT DETECTED    Name of physician (or Provider) Contacted: Dr. Posey Pronto  Changes to prescribed antibiotics required: No changes at this time. This is likely a contaminant coag negative staph. Will continue levofloxacin and monitor per discussion with MD.  Lenis Noon, PharmD 05/21/2016  2:36 PM

## 2016-05-21 NOTE — Progress Notes (Signed)
ANTIBIOTIC CONSULT NOTE - INITIAL  Pharmacy Consult for Vancomycin  Indication: bacteremia  Allergies  Allergen Reactions  . Penicillins Hives, Itching and Swelling    Has patient had a PCN reaction causing immediate rash, facial/tongue/throat swelling, SOB or lightheadedness with hypotension: Yes Has patient had a PCN reaction causing severe rash involving mucus membranes or skin necrosis: No Has patient had a PCN reaction that required hospitalization No Has patient had a PCN reaction occurring within the last 10 years: No If all of the above answers are "NO", then may proceed with Cephalosporin use.    Patient Measurements: Height: 6\' 1"  (185.4 cm) Weight: 173 lb 8 oz (78.7 kg) IBW/kg (Calculated) : 79.9 Adjusted Body Weight: 79.4 kg   Vital Signs: Temp: 98.4 F (36.9 C) (08/29 1936) Temp Source: Oral (08/29 1936) BP: 115/68 (08/29 1936) Pulse Rate: 105 (08/29 1936) Intake/Output from previous day: 08/28 0701 - 08/29 0700 In: 250 [IV Piggyback:250] Out: -  Intake/Output from this shift: No intake/output data recorded.  Labs:  Recent Labs  05/20/16 1146 05/21/16 0411  WBC 9.6 5.3  HGB 8.7* 8.0*  PLT 134* 125*  CREATININE 5.62* 6.44*   Estimated Creatinine Clearance: 11.2 mL/min (by C-G formula based on SCr of 6.44 mg/dL). No results for input(s): VANCOTROUGH, VANCOPEAK, VANCORANDOM, GENTTROUGH, GENTPEAK, GENTRANDOM, TOBRATROUGH, TOBRAPEAK, TOBRARND, AMIKACINPEAK, AMIKACINTROU, AMIKACIN in the last 72 hours.   Microbiology: Recent Results (from the past 720 hour(s))  MRSA PCR Screening     Status: None   Collection Time: 05/07/16  1:30 AM  Result Value Ref Range Status   MRSA by PCR NEGATIVE NEGATIVE Final    Comment:        The GeneXpert MRSA Assay (FDA approved for NASAL specimens only), is one component of a comprehensive MRSA colonization surveillance program. It is not intended to diagnose MRSA infection nor to guide or monitor treatment for MRSA  infections.   Blood culture (routine x 2)     Status: None (Preliminary result)   Collection Time: 05/20/16 11:46 AM  Result Value Ref Range Status   Specimen Description BLOOD RIGHT ARM  Final   Special Requests   Final    BOTTLES DRAWN AEROBIC AND ANAEROBIC AER 4ML ANA .5ML   Culture  Setup Time   Final    Organism ID to follow GRAM POSITIVE COCCI AEROBIC BOTTLE ONLY CRITICAL RESULT CALLED TO, READ BACK BY AND VERIFIED WITH: KAREN HAYES AT 1415 05/21/16 SDR    Culture GRAM POSITIVE COCCI  Final   Report Status PENDING  Incomplete  Blood culture (routine x 2)     Status: None (Preliminary result)   Collection Time: 05/20/16 11:46 AM  Result Value Ref Range Status   Specimen Description BLOOD RIGHT HAND  Final   Special Requests   Final    BOTTLES DRAWN AEROBIC AND ANAEROBIC AER 2ML ANA 1ML   Culture  Setup Time   Final    Organism ID to follow GRAM POSITIVE COCCI AEROBIC BOTTLE ONLY CRITICAL RESULT CALLED TO, READ BACK BY AND VERIFIED WITH:  JODY BAREFOOT 05/21/16 AT 2011 BY HS    Culture GRAM POSITIVE COCCI GRAM POSITIVE COCCI   Final   Report Status PENDING  Incomplete  Blood Culture ID Panel (Reflexed)     Status: Abnormal   Collection Time: 05/20/16 11:46 AM  Result Value Ref Range Status   Enterococcus species NOT DETECTED NOT DETECTED Final   Listeria monocytogenes NOT DETECTED NOT DETECTED Final   Staphylococcus species DETECTED (  A) NOT DETECTED Final    Comment: CRITICAL RESULT CALLED TO, READ BACK BY AND VERIFIED WITH: KAREN HAYES AT [id number] 05/21/16 SDR    Staphylococcus aureus NOT DETECTED NOT DETECTED Final   Methicillin resistance DETECTED (A) NOT DETECTED Final    Comment: CRITICAL RESULT CALLED TO, READ BACK BY AND VERIFIED WITH: KAREN HAYES AT [id number] 05/21/16 SDR    Streptococcus species NOT DETECTED NOT DETECTED Final   Streptococcus agalactiae NOT DETECTED NOT DETECTED Final   Streptococcus pneumoniae NOT DETECTED NOT DETECTED Final   Streptococcus pyogenes  NOT DETECTED NOT DETECTED Final   Acinetobacter baumannii NOT DETECTED NOT DETECTED Final   Enterobacteriaceae species NOT DETECTED NOT DETECTED Final   Enterobacter cloacae complex NOT DETECTED NOT DETECTED Final   Escherichia coli NOT DETECTED NOT DETECTED Final   Klebsiella oxytoca NOT DETECTED NOT DETECTED Final   Klebsiella pneumoniae NOT DETECTED NOT DETECTED Final   Proteus species NOT DETECTED NOT DETECTED Final   Serratia marcescens NOT DETECTED NOT DETECTED Final   Haemophilus influenzae NOT DETECTED NOT DETECTED Final   Neisseria meningitidis NOT DETECTED NOT DETECTED Final   Pseudomonas aeruginosa NOT DETECTED NOT DETECTED Final   Candida albicans NOT DETECTED NOT DETECTED Final   Candida glabrata NOT DETECTED NOT DETECTED Final   Candida krusei NOT DETECTED NOT DETECTED Final   Candida parapsilosis NOT DETECTED NOT DETECTED Final   Candida tropicalis NOT DETECTED NOT DETECTED Final    Medical History: Past Medical History:  Diagnosis Date  . Allergic rhinitis   . Anemia    a. baseline hgb ~ 8  . Bladder cancer ([place])   . Chronic combined systolic and diastolic CHF (congestive heart failure) ([place])    a. echo 12/2014: EF 25-30%, anterior wall wall motion abnormalities; b. planned ischemic evaluation once patient is stable medically; c. echo 01/2015: EF 45-50%, no RWMA, mild AT, LA mildly dilated, mod pericardial effusion along LV free wall, no evidence of hemodynamic compromise  . Cognitive communication deficit   . COPD (chronic obstructive pulmonary disease) ([place])   . Dementia   . Detrusor sphincter dyssynergia   . ESRD on hemodialysis ([place])    a. Tuesday, Thursday, and Saturdays  . GERD (gastroesophageal reflux disease)   . History of septic shock    a. 01/2015  . History of small bowel obstruction    a. 01/2015  . Hydronephrosis   . Hypercholesteremia   . Hypertension   . 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 Incontinence   . Magnesium deficiency   . Mini stroke ([place])   .  Overactive bladder   . PAF (paroxysmal atrial fibrillation) ([place])    a. new onset 12/2014 in the setting of UTI, sepsis, hypotension, and anemia; b. not on long term anticoagulation given anemia; c. family aware of stroke risk, they are ok with this; d. on amiodarone   . Prostate cancer ([place])     Medications:  Prescriptions Prior to Admission  Medication Sig Dispense Refill Last Dose  . albuterol (PROVENTIL) (2.5 MG/3ML) 0.083% nebulizer solution Take 3 mLs (2.5 mg total) by nebulization every 4 (four) hours as needed for wheezing or shortness of breath. 75 mL 12 Past Week at Unknown time  . amiodarone (PACERONE) 200 MG tablet Take 1 tablet (200 mg total) by mouth daily. 30 tablet 5 05/20/2016 at 0900  . atorvastatin (LIPITOR) 40 MG tablet Take 40 mg by mouth at bedtime.   05/19/2016 at 2100  . budesonide-formoterol (SYMBICORT) 160-4.5 MCG/ACT inhaler Inhale 2  puffs into the lungs 2 (two) times daily.   05/20/2016 at 0900  . carvedilol (COREG) 3.125 MG tablet Take 1 tablet (3.125 mg total) by mouth 2 (two) times daily with a meal. 60 tablet 2 05/20/2016 at 0900  . ferrous sulfate 325 (65 FE) MG tablet Take 325 mg by mouth daily.   05/20/2016 at 0800  . guaiFENesin (MUCINEX) 600 MG 12 hr tablet Take 600 mg by mouth 2 (two) times daily. (Take every morning and at bedtime for cough)   05/20/2016 at 0800  . ipratropium-albuterol (DUONEB) 0.5-2.5 (3) MG/3ML SOLN Take 3 mLs by nebulization 3 (three) times daily.   05/20/2016 at 0600  . lamoTRIgine (LAMICTAL) 25 MG tablet Take 50 mg by mouth at bedtime. Start on July 14th.   05/19/2016 at 2100  . latanoprost (XALATAN) 0.005 % ophthalmic solution Place 1 drop into both eyes at bedtime.   05/19/2016 at 2100  . LORazepam (ATIVAN) 0.5 MG tablet Take 1 tablet (0.5 mg total) by mouth every 4 (four) hours as needed for anxiety. 30 tablet 0 Past Week at Unknown time  . magnesium oxide (MAG-OX) 400 MG tablet Take 1 tablet (400 mg total) by mouth every evening. 30 tablet 2  05/19/2016 at 1600  . oxyCODONE (OXY IR/ROXICODONE) 5 MG immediate release tablet Take 1 tablet (5 mg total) by mouth every 6 (six) hours as needed for moderate pain or severe pain. 20 tablet 0 05/20/2016 at 2151  . POLYETHYLENE GLYCOL 3350 PO Take 17 g by mouth daily as needed. For constipation. *Mix 1 capful in 6-8 ounces of beverage*   05/19/2016 at 1300  . predniSONE (DELTASONE) 10 MG tablet Take 10 mg by mouth daily. 4 tablets given 05/10/2016 3 tablets given 05/11/2016 2 tablets given 05/12/2016 1 tablet given   05/13/2016     . sevelamer carbonate (RENVELA) 800 MG tablet Take 800 mg by mouth 3 (three) times daily before meals.    05/20/2016 at 0900  . tiotropium (SPIRIVA) 18 MCG inhalation capsule Place 1 capsule (18 mcg total) into inhaler and inhale daily. 30 capsule 12 05/20/2016 at 0900  . acetaminophen (TYLENOL) 325 MG tablet Take 650 mg by mouth every 8 (eight) hours as needed. For general discomfort   prn at prn  . Amino Acids-Protein Hydrolys (FEEDING SUPPLEMENT, PRO-STAT SUGAR FREE 64,) LIQD Take 30 mLs by mouth daily with supper.   05/05/2016 at 1700  . guaifenesin (ROBITUSSIN) 100 MG/5ML syrup Take 200 mg by mouth every 4 (four) hours as needed for cough.   prn at prn  . ondansetron (ZOFRAN) 8 MG tablet Take by mouth every 8 (eight) hours as needed for nausea.   prn at prn  . OXYGEN Inhale 2-3 L into the lungs as needed. For shortness of breath, respiratory distress or O2 sats <90.   Taking   Assessment: Pharmacy consulted to dose Vancomycin in this 75 year old male admitted with bacteremia, on HD every T-Th-Sat .    Goal of Therapy:  Vancomycin trough level 15-20 mcg/ml  Plan:  Expected duration 7 days with resolution of temperature and/or normalization of WBC   Vancomycin 1750 mg IV X 1 ordered to be given on 8/29 @ 2100. Vancomycin 750 mg IV Q T-Th-Sat . Will draw 1st Vanc trough 30 minutes before HD session scheduled for 9/5.   Larrie Lucia D 05/21/2016,8:59 PM

## 2016-05-21 NOTE — Progress Notes (Signed)
Inpatient Diabetes Program Recommendations  AACE/ADA: New Consensus Statement on Inpatient Glycemic Control (2015)  Target Ranges:  Prepandial:   less than 140 mg/dL      Peak postprandial:   less than 180 mg/dL (1-2 hours)      Critically ill patients:  140 - 180 mg/dL   Results for [name], [name] (MRN [id number]) as of 05/21/2016 10:23  Ref. Range 05/20/2016 11:46 05/21/2016 04:11  Glucose Latest Ref Range: 65 - 99 mg/dL 99 218 (H)    Admit with: SOB  History: CHF, COPD, ESRD, Dementia    -Note patient does not have documented history of DM, however, patient is currently receiving IV Solumedrol 60 mg Q6 hours.  -Lab glucose 218 mg/dl this AM on BMET.    MD- If you note that patient continues to have elevated lab glucose levels while getting steroids, please consider placing order for Novolog Sensitive Correction Scale/ SSI (0-9 units) TID AC + HS      --Will follow patient during hospitalization--  Wyn Quaker RN, MSN, CDE Diabetes Coordinator Inpatient Glycemic Control Team Team Pager: [phone] (8a-5p)

## 2016-05-21 NOTE — Progress Notes (Signed)
PRE HD ASSESSMENT 

## 2016-05-21 NOTE — Progress Notes (Signed)
[place] at [facility]                                                                                                                                                                                            Patient Demographics   [name], is a 75 y.o. male, DOB - 12-30-1940, [id number]  Admit date - 05/20/2016   Admitting Physician Nicholes Mango, MD  Outpatient Primary MD for the patient is Dion Body, MD   LOS - 1  Subjective:Patient is breathing is somewhat improved. He is known to me from previously. He denies any chest pains or palpitations complains of productive cough     Review of Systems:   CONSTITUTIONAL: No documented fever. No fatigue, weakness. No weight gain, no weight loss.  EYES: No blurry or double vision.  ENT: No tinnitus. No postnasal drip. No redness of the oropharynx.  RESPIRATORY: Positive cough, no wheeze, no hemoptysis. Positive dyspnea.  CARDIOVASCULAR: No chest pain. No orthopnea. No palpitations. No syncope.  GASTROINTESTINAL: No nausea, no vomiting or diarrhea. No abdominal pain. No melena or hematochezia.  GENITOURINARY: No dysuria or hematuria.  ENDOCRINE: No polyuria or nocturia. No heat or cold intolerance.  HEMATOLOGY: No anemia. No bruising. No bleeding.  INTEGUMENTARY: No rashes. No lesions.  MUSCULOSKELETAL: No arthritis. No swelling. No gout.  NEUROLOGIC: No numbness, tingling, or ataxia. No seizure-type activity.  PSYCHIATRIC: No anxiety. No insomnia. No ADD.    Vitals:   Vitals:   05/21/16 0224 05/21/16 0420 05/21/16 0749 05/21/16 1130  BP:  126/78  113/86  Pulse:  99  99  Resp:  15  19  Temp:  98.2 F (36.8 C)    TempSrc:  Oral    SpO2: 100% 100% 100% 100%  Weight:      Height:        Wt Readings from Last 3 Encounters:  05/20/16 79.5 kg (175 lb 3.2 oz)  05/09/16 82.1 kg (181 lb)  04/04/16 81.9 kg (180 lb 8.9 oz)     Intake/Output Summary (Last 24 hours) at  05/21/16 1309 Last data filed at 05/21/16 1248  Gross per 24 hour  Intake              490 ml  Output                0 ml  Net              490 ml    Physical Exam:   GENERAL: Pleasant-appearing in no apparent distress.  HEAD, EYES, EARS, NOSE AND THROAT: Atraumatic, normocephalic. Extraocular muscles are intact.  Pupils equal and reactive to light. Sclerae anicteric. No conjunctival injection. No oro-pharyngeal erythema.  NECK: Supple. There is no jugular venous distention. No bruits, no lymphadenopathy, no thyromegaly.  HEART: Regular rate and rhythm,. No murmurs, no rubs, no clicks.  LUNGS: Bilateral rhonchi and wheezing ABDOMEN: Soft, flat, nontender, nondistended. Has good bowel sounds. No hepatosplenomegaly appreciated.  EXTREMITIES: No evidence of any cyanosis, clubbing, or peripheral edema.  +2 pedal and radial pulses bilaterally.  NEUROLOGIC: The patient is alert, awake, and oriented x3 with no focal motor or sensory deficits appreciated bilaterally.  SKIN: Moist and warm with no rashes appreciated.  Psych: Not anxious, depressed LN: No inguinal LN enlargement    Antibiotics   Anti-infectives    Start     Dose/Rate Route Frequency Ordered Stop   05/21/16 1000  levofloxacin (LEVAQUIN) tablet 500 mg     500 mg Oral Daily 05/21/16 0939     05/20/16 1800  azithromycin (ZITHROMAX) 500 mg in dextrose 5 % 250 mL IVPB     500 mg 250 mL/hr over 60 Minutes Intravenous Every 24 hours 05/20/16 1643 05/23/16 1759      Medications   Scheduled Meds: . acetylcysteine  4 mL Nebulization BID  . amiodarone  200 mg Oral Daily  . atorvastatin  40 mg Oral QHS  . azithromycin  500 mg Intravenous Q24H  . carvedilol  3.125 mg Oral BID WC  . docusate sodium  100 mg Oral BID  . [START ON 05/23/2016] epoetin (EPOGEN/PROCRIT) injection  4,000 Units Intravenous Q T,Th,Sa-HD  . feeding supplement (PRO-STAT SUGAR FREE 64)  30 mL Oral Q supper  . ferrous sulfate  325 mg Oral Daily  . guaiFENesin   600 mg Oral BID  . heparin subcutaneous  5,000 Units Subcutaneous Q12H  . ipratropium-albuterol  3 mL Nebulization Q6H  . lamoTRIgine  50 mg Oral QHS  . latanoprost  1 drop Both Eyes QHS  . levofloxacin  500 mg Oral Daily  . magnesium oxide  400 mg Oral QPM  . methylPREDNISolone (SOLU-MEDROL) injection  60 mg Intravenous Q6H  . mometasone-formoterol  2 puff Inhalation BID  . sevelamer carbonate  800 mg Oral TID WC  . sodium chloride flush  3 mL Intravenous Q12H  . sodium chloride flush  3 mL Intravenous Q12H  . tiotropium  18 mcg Inhalation Daily   Continuous Infusions:  PRN Meds:.sodium chloride, acetaminophen, albuterol, guaiFENesin, LORazepam, ondansetron, oxyCODONE, polyethylene glycol powder, sodium chloride flush   Data Review:   Micro Results Recent Results (from the past 240 hour(s))  Blood culture (routine x 2)     Status: None (Preliminary result)   Collection Time: 05/20/16 11:46 AM  Result Value Ref Range Status   Specimen Description BLOOD RIGHT ARM  Final   Special Requests   Final    BOTTLES DRAWN AEROBIC AND ANAEROBIC AER 4ML ANA .5ML   Culture  Setup Time Organism ID to follow  Final   Culture NO GROWTH < 24 HOURS  Final   Report Status PENDING  Incomplete  Blood culture (routine x 2)     Status: None (Preliminary result)   Collection Time: 05/20/16 11:46 AM  Result Value Ref Range Status   Specimen Description BLOOD RIGHT HAND  Final   Special Requests   Final    BOTTLES DRAWN AEROBIC AND ANAEROBIC AER 2ML ANA 1ML   Culture NO GROWTH < 24 HOURS  Final   Report Status PENDING  Incomplete    Radiology Reports  Dg Abdomen 1 View  Result Date: 05/20/2016 CLINICAL DATA:  Constipation for 3 days. Last bowel movement 3 days ago. EXAM: ABDOMEN - 1 VIEW COMPARISON:  CTA runoff 01/23/2016.  Chest radiographs 05/20/2016. FINDINGS: Two erect views of the upper abdomen were obtained. The lower abdomen is not visualized. There are bilateral pleural effusions with  bibasilar atelectasis and possible pulmonary edema. The heart is enlarged. No free intraperitoneal air is seen. The visualized bowel gas pattern is normal. Aortic stent graft is partially imaged. IMPRESSION: No evidence of pneumoperitoneum. The visualized bowel gas pattern is normal, although the entire abdomen is not imaged. Electronically Signed   By: Richardean Sale M.D.   On: 05/20/2016 13:54   Dg Chest Portable 1 View  Result Date: 05/20/2016 CLINICAL DATA:  Shortness of breath. EXAM: PORTABLE CHEST 1 VIEW COMPARISON:  Radiograph of May 06, 2016. FINDINGS: Stable cardiomegaly. No pneumothorax is noted. Mild right basilar opacity is noted concerning for edema or atelectasis. No significant pleural effusion is noted. Minimal scarring or subsegmental atelectasis is noted in the left lower lobe. Bony thorax is unremarkable. IMPRESSION: Mild right basilar edema or subsegmental atelectasis. Minimal left basilar subsegmental atelectasis is noted. These are decreased compared to prior exam. Electronically Signed   By: Marijo Conception, M.D.   On: 05/20/2016 11:53   Dg Chest Portable 1 View  Result Date: 05/06/2016 CLINICAL DATA:  Respiratory distress. Weakness. Congestive heart failure. COPD. End-stage renal disease. Prostate and bladder carcinoma. EXAM: PORTABLE CHEST 1 VIEW COMPARISON:  04/03/2016 FINDINGS: Moderate to severe cardiomegaly is stable as well as ectasia of the thoracic aorta. Bilateral mixed interstitial and airspace disease is again seen which appear symmetric has not significant change. This suspicious for pulmonary edema. Small bilateral pleural effusions are also not significantly changed. IMPRESSION: Stable cardiomegaly, diffuse pulmonary edema, and small bilateral pleural effusions, consistent with congestive heart failure. Electronically Signed   By: Earle Gell M.D.   On: 05/06/2016 19:49     CBC  Recent Labs Lab 05/20/16 1146 05/21/16 0411  WBC 9.6 5.3  HGB 8.7* 8.0*  HCT  27.0* 24.7*  PLT 134* 125*  MCV 83.9 84.0  MCH 27.1 27.4  MCHC 32.3 32.6  RDW 18.5* 18.0*  LYMPHSABS 1.1  --   MONOABS 1.5*  --   EOSABS 0.2  --   BASOSABS 0.0  --     Chemistries   Recent Labs Lab 05/20/16 1146 05/21/16 0411  NA 140 141  K 4.3 4.5  CL 98* 99*  CO2 28 27  GLUCOSE 99 218*  BUN 70* 83*  CREATININE 5.62* 6.44*  CALCIUM 8.0* 7.6*  AST 14*  --   ALT 8*  --   ALKPHOS 287*  --   BILITOT 1.0  --    ------------------------------------------------------------------------------------------------------------------ estimated creatinine clearance is 11.3 mL/min (by C-G formula based on SCr of 6.44 mg/dL). ------------------------------------------------------------------------------------------------------------------ No results for input(s): HGBA1C in the last 72 hours. ------------------------------------------------------------------------------------------------------------------ No results for input(s): CHOL, HDL, LDLCALC, TRIG, CHOLHDL, LDLDIRECT in the last 72 hours. ------------------------------------------------------------------------------------------------------------------ No results for input(s): TSH, T4TOTAL, T3FREE, THYROIDAB in the last 72 hours.  Invalid input(s): FREET3 ------------------------------------------------------------------------------------------------------------------ No results for input(s): VITAMINB12, FOLATE, FERRITIN, TIBC, IRON, RETICCTPCT in the last 72 hours.  Coagulation profile No results for input(s): INR, PROTIME in the last 168 hours.  No results for input(s): DDIMER in the last 72 hours.  Cardiac Enzymes  Recent Labs Lab 05/20/16 1146  TROPONINI 0.04*   ------------------------------------------------------------------------------------------------------------------ Invalid input(s): POCBNP  Assessment & Plan   [name]  is a 75 y.o. male with a known history of COPD, chronic combined systolic  and diastolic congestive heart failure, anemia, end-stage renal disease on hemodialysis on Tuesday, Thursday and Saturday, essential hypertension and multiple other medical problems was brought to the hospital from [place] home for shortness of breath. Patient at baseline lives on 2 L of oxygen but in the emergency department he was hypoxemic and pulse ox was at 85% on CPAP and eventually it was changed to BiPAP and IV steroids   #Acute hypoxic respiratory failure with tachypnea secondary to COPD exacerbation Continue nebulizers Continue IV Solu-Medrol Add Mucomyst to current treatment Continue oxygen therapy  #End stage renal disease on hemodialysis on Tuesday Thursday and Saturday Nephrologist consult for hemodialysis  #Essential hypertension Resume his home medications Coreg and amiodarone  #History of bladder cancer with bone metastases Outpatient follow-up with oncology as recommended      Code Status Orders        Start     Ordered   05/20/16 1643  Full code  Continuous     05/20/16 1643    Code Status History    Date Active Date Inactive Code Status Order ID Comments User Context   05/06/2016  9:09 PM 05/09/2016 10:31 PM Full Code [id number]  Awilda Bill, NP ED   03/30/2016  1:47 PM 04/04/2016  5:48 PM Full Code [id number]  Idelle Crouch, MD Inpatient   03/12/2016  3:28 PM 03/14/2016  9:36 PM Full Code [id number]  Henreitta Leber, MD Inpatient   10/19/2015  9:09 AM 10/21/2015  2:40 AM Full Code [id number]  Harrie Foreman, MD Inpatient   09/11/2015  9:55 AM 09/13/2015  6:51 PM Full Code [id number]  Gladstone Lighter, MD Inpatient   07/23/2015  3:03 PM 07/24/2015  9:39 PM Full Code [id number]  Vaughan Basta, MD Inpatient   06/06/2015  1:40 PM 06/10/2015  8:35 PM Full Code [id number]  Bettey Costa, MD ED   05/04/2015  6:59 PM 05/08/2015  6:09 PM Full Code [id number]  Loletha Grayer, MD ED   03/20/2015  6:35 PM 03/27/2015  8:00 PM Full Code [id number]  Loletha Grayer, MD ED   02/13/2015 10:04 PM 02/17/2015  8:15 PM Full Code [id number]  Hillary Bow, MD Inpatient   01/21/2015 11:45 PM 02/04/2015  9:31 PM Full Code [id number]  Dia Sitter, RN Inpatient           Consults nephrology   DVT Prophylaxis  heparin  Lab Results  Component Value Date   PLT 125 (L) 05/21/2016     Time Spent in minutes 53min  Greater than 50% of time spent in care coordination and counseling patient regarding the condition and plan of care.   Dustin Flock M.D on 05/21/2016 at 1:09 PM  Between 7am to 6pm - Pager - [phone]  After 6pm go to [email or web address] - password [email or web address] [facility]   Office  [phone]

## 2016-05-21 NOTE — Progress Notes (Signed)
POST HD VITALS 

## 2016-05-21 NOTE — Progress Notes (Addendum)
Received a call from [facility] in lab, reporting blood cultures with 1 of 4 bottles GPCs with no organism identified on BCID.  Previously reported 1 of 4 bottles with GPCs, BCID indicating methicillin resistant staph species. This was discussed with hospitalist earlier today, determined to be likely contaminant (see previous note).  Helen to recheck cultures and call back to confirm BCID results.  Rexene Edison, PharmD Clinical Pharmacist  05/21/2016 8:24 PM   Spoke with Bonnita Nasuti in lab - reporting two separate bottles with GPCs  R Hand - GPC in aerobic bottle; BCID species not identified  R Arm - GPC in aerobic bottle, BCID methicillin resistant staph species.  Discussed with Dr. Darvin Neighbours, who would like to start vancomycin at this point and await further culture results  Rexene Edison, PharmD Clinical Pharmacist  05/21/2016 8:40 PM

## 2016-05-21 NOTE — Progress Notes (Signed)
This note also relates to the following rows which could not be included: Pulse Rate - Cannot attach notes to unvalidated device data Resp - Cannot attach notes to unvalidated device data SpO2 - Cannot attach notes to unvalidated device data  PRE HD INFO

## 2016-05-21 NOTE — Progress Notes (Signed)
POST HD ASSESSMENT 

## 2016-05-21 NOTE — Progress Notes (Signed)
END OF HD TX 

## 2016-05-21 NOTE — Progress Notes (Signed)
Pt returned from dialysis, dressing to lt upper arm fistula dry and intact.  VSS.  Denies need at this time. CB in reach, SR up x 2

## 2016-05-21 NOTE — Progress Notes (Signed)
Spoke with [facility], state that pt had PICC line placed for a "liter of IVF over the weekend" no further use for PICC line for them.  Page out to Dr. Serita Grit to discontinue PICC line.

## 2016-05-21 NOTE — Progress Notes (Signed)
[facility]  ROUNDING NOTE   Subjective:   Patient states that he was at the GI appointment yesterday However there, he got short of breath He was sent to the emergency room for evaluation and admitted overnight for pulmonary edema This morning, he does complain of slight shortness of breath. Today's his normal dialysis day  Objective:  Vital signs in last 24 hours:  Temp:  [97.3 F (36.3 C)-98.2 F (36.8 C)] 98.2 F (36.8 C) (08/29 0420) Pulse Rate:  [99-108] 99 (08/29 1130) Resp:  [15-28] 19 (08/29 1130) BP: (94-133)/(47-86) 113/86 (08/29 1130) SpO2:  [97 %-100 %] 100 % (08/29 1130) FiO2 (%):  [28 %-40 %] 28 % (08/28 1725) Weight:  [79.5 kg (175 lb 3.2 oz)] 79.5 kg (175 lb 3.2 oz) (08/28 1640)  Weight change:  Filed Weights   05/20/16 1143 05/20/16 1640  Weight: 82.1 kg (181 lb) 79.5 kg (175 lb 3.2 oz)    Intake/Output: I/O last 3 completed shifts: In: 250 [IV Piggyback:250] Out: -    Intake/Output this shift:  Total I/O In: 240 [P.O.:240] Out: 0   Physical Exam: General: No acute distress  Head: Normocephalic, atraumatic. Moist oral mucosal membranes  Eyes: Anicteric  Neck: Supple, trachea midline  Lungs:  Basilar rales, normal effort  Heart: S1S2 no rubs  Abdomen:  Soft, nontender, Bowel sounds present   Extremities: No peripheral edema.  Neurologic: Nonfocal, moving all four extremities  Skin: No lesions  Access: LUE AVF    Basic Metabolic Panel:  Recent Labs Lab 05/20/16 1146 05/21/16 0411  NA 140 141  K 4.3 4.5  CL 98* 99*  CO2 28 27  GLUCOSE 99 218*  BUN 70* 83*  CREATININE 5.62* 6.44*  CALCIUM 8.0* 7.6*    Liver Function Tests:  Recent Labs Lab 05/20/16 1146  AST 14*  ALT 8*  ALKPHOS 287*  BILITOT 1.0  PROT 5.4*  ALBUMIN 2.5*   No results for input(s): LIPASE, AMYLASE in the last 168 hours. No results for input(s): AMMONIA in the last 168 hours.  CBC:  Recent Labs Lab 05/20/16 1146 05/21/16 0411  WBC 9.6  5.3  NEUTROABS 6.8*  --   HGB 8.7* 8.0*  HCT 27.0* 24.7*  MCV 83.9 84.0  PLT 134* 125*    Cardiac Enzymes:  Recent Labs Lab 05/20/16 1146  TROPONINI 0.04*    BNP: Invalid input(s): POCBNP  CBG: No results for input(s): GLUCAP in the last 168 hours.  Microbiology: Results for orders placed or performed during the hospital encounter of 05/20/16  Blood culture (routine x 2)     Status: None (Preliminary result)   Collection Time: 05/20/16 11:46 AM  Result Value Ref Range Status   Specimen Description BLOOD RIGHT ARM  Final   Special Requests   Final    BOTTLES DRAWN AEROBIC AND ANAEROBIC AER 4ML ANA .5ML   Culture NO GROWTH < 24 HOURS  Final   Report Status PENDING  Incomplete  Blood culture (routine x 2)     Status: None (Preliminary result)   Collection Time: 05/20/16 11:46 AM  Result Value Ref Range Status   Specimen Description BLOOD RIGHT HAND  Final   Special Requests   Final    BOTTLES DRAWN AEROBIC AND ANAEROBIC AER 2ML ANA 1ML   Culture NO GROWTH < 24 HOURS  Final   Report Status PENDING  Incomplete    Coagulation Studies: No results for input(s): LABPROT, INR in the last 72 hours.  Urinalysis: No  results for input(s): COLORURINE, LABSPEC, [place], GLUCOSEU, HGBUR, BILIRUBINUR, KETONESUR, PROTEINUR, UROBILINOGEN, NITRITE, LEUKOCYTESUR in the last 72 hours.  Invalid input(s): APPERANCEUR    Imaging: Dg Abdomen 1 View  Result Date: 05/20/2016 CLINICAL DATA:  Constipation for 3 days. Last bowel movement 3 days ago. EXAM: ABDOMEN - 1 VIEW COMPARISON:  CTA runoff 01/23/2016.  Chest radiographs 05/20/2016. FINDINGS: Two erect views of the upper abdomen were obtained. The lower abdomen is not visualized. There are bilateral pleural effusions with bibasilar atelectasis and possible pulmonary edema. The heart is enlarged. No free intraperitoneal air is seen. The visualized bowel gas pattern is normal. Aortic stent graft is partially imaged. IMPRESSION: No evidence  of pneumoperitoneum. The visualized bowel gas pattern is normal, although the entire abdomen is not imaged. Electronically Signed   By: Richardean Sale M.D.   On: 05/20/2016 13:54   Dg Chest Portable 1 View  Result Date: 05/20/2016 CLINICAL DATA:  Shortness of breath. EXAM: PORTABLE CHEST 1 VIEW COMPARISON:  Radiograph of May 06, 2016. FINDINGS: Stable cardiomegaly. No pneumothorax is noted. Mild right basilar opacity is noted concerning for edema or atelectasis. No significant pleural effusion is noted. Minimal scarring or subsegmental atelectasis is noted in the left lower lobe. Bony thorax is unremarkable. IMPRESSION: Mild right basilar edema or subsegmental atelectasis. Minimal left basilar subsegmental atelectasis is noted. These are decreased compared to prior exam. Electronically Signed   By: Marijo Conception, M.D.   On: 05/20/2016 11:53     Medications:     . acetylcysteine  4 mL Nebulization BID  . amiodarone  200 mg Oral Daily  . atorvastatin  40 mg Oral QHS  . azithromycin  500 mg Intravenous Q24H  . carvedilol  3.125 mg Oral BID WC  . docusate sodium  100 mg Oral BID  . [START ON 05/23/2016] epoetin (EPOGEN/PROCRIT) injection  4,000 Units Intravenous Q T,Th,Sa-HD  . feeding supplement (PRO-STAT SUGAR FREE 64)  30 mL Oral Q supper  . ferrous sulfate  325 mg Oral Daily  . guaiFENesin  600 mg Oral BID  . heparin subcutaneous  5,000 Units Subcutaneous Q12H  . ipratropium-albuterol  3 mL Nebulization Q6H  . lamoTRIgine  50 mg Oral QHS  . latanoprost  1 drop Both Eyes QHS  . levofloxacin  500 mg Oral Daily  . magnesium oxide  400 mg Oral QPM  . methylPREDNISolone (SOLU-MEDROL) injection  60 mg Intravenous Q6H  . mometasone-formoterol  2 puff Inhalation BID  . sevelamer carbonate  800 mg Oral TID WC  . sodium chloride flush  3 mL Intravenous Q12H  . sodium chloride flush  3 mL Intravenous Q12H  . tiotropium  18 mcg Inhalation Daily   sodium chloride, acetaminophen, albuterol,  guaiFENesin, LORazepam, ondansetron, oxyCODONE, polyethylene glycol powder, sodium chloride flush  Assessment/ Plan:  75 y.o. 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 ESRD on HD TTS: Admitted for volume overload  -  Dialysis today - Patient under his dry weight of 82 kg - BP low normal. Will carefully set UF goal of 1.5-2 kg as tolerated   2. Anemia of chronic kidney disease. . - continue EPO with HD  3. Secondary hyperparathyroidism.  Monitor phos   Rima Blizzard 8/29/201712:04 PM

## 2016-05-22 LAB — GLUCOSE, CAPILLARY
GLUCOSE-CAPILLARY: 219 mg/dL — AB (ref 65–99)
GLUCOSE-CAPILLARY: 171 mg/dL — AB (ref 65–99)

## 2016-05-22 MED ORDER — LORAZEPAM 0.5 MG PO TABS: 0.5000 mg | ORAL_TABLET | ORAL | 0 refills | Status: AC | PRN

## 2016-05-22 MED ORDER — LEVOFLOXACIN 250 MG PO TABS: 250.0000 mg | ORAL_TABLET | ORAL | 0 refills | Status: AC

## 2016-05-22 MED ORDER — OXYCODONE HCL 5 MG PO TABS: 5.0000 mg | ORAL_TABLET | Freq: Four times a day (QID) | ORAL | 0 refills | Status: AC | PRN

## 2016-05-22 MED ORDER — HEPARIN SODIUM (PORCINE) 1000 UNIT/ML DIALYSIS
50.0000 [IU]/kg | INTRAMUSCULAR | Status: DC | PRN
  Filled 2016-05-22: qty 4

## 2016-05-22 MED ORDER — PREDNISONE 50 MG PO TABS: ORAL_TABLET | ORAL | Status: DC

## 2016-05-22 NOTE — Clinical Social Work Note (Addendum)
Patient to be d/c'ed today to [facility].  Patient and family agreeable to plans will transport via ems RN to call report to [phone].  MSW notified patient's relative [name] at [phone] who is aware that patient will be discharging today.   Evette Cristal, MSW Mon-Fri 8a-4:30p [phone]

## 2016-05-22 NOTE — Progress Notes (Signed)
Dialysis Initiated

## 2016-05-22 NOTE — Progress Notes (Signed)
Pre Dialysis 

## 2016-05-22 NOTE — Care Management Important Message (Signed)
Important Message  Patient Details  Name: [name] MRN: [id number] Date of Birth: 09-08-1941   Medicare Important Message Given:  Yes    Katrina Stack, RN 05/22/2016, 9:35 AM

## 2016-05-22 NOTE — Progress Notes (Signed)
Ems on floor to transport patient to [facility]. Patient to go on o2 at 2L. Packet given to ems. No distress noted

## 2016-05-22 NOTE — Progress Notes (Signed)
Patient transferred to dialysis.  Discharge pending when he returns.

## 2016-05-22 NOTE — Progress Notes (Signed)
HD completed

## 2016-05-22 NOTE — Discharge Summary (Signed)
[facility] NAME: Ravi Kauk    MR#:  [id number]  DATE OF BIRTH:  1941/04/07  DATE OF ADMISSION:  05/20/2016 ADMITTING PHYSICIAN: Nicholes Mango, MD  DATE OF DISCHARGE: 05/22/2016  PRIMARY CARE PHYSICIAN: Dion Body, MD    ADMISSION DIAGNOSIS:  COPD with acute exacerbation ([place]) [J44.1]  DISCHARGE DIAGNOSIS:  Active Problems:   Acute respiratory failure with hypoxia ([place])   SECONDARY DIAGNOSIS:   Past Medical History:  Diagnosis Date  . Allergic rhinitis   . Anemia    a. baseline hgb ~ 8  . Bladder cancer ([place])   . Chronic combined systolic and diastolic CHF (congestive heart failure) ([place])    a. echo 12/2014: EF 25-30%, anterior wall wall motion abnormalities; b. planned ischemic evaluation once patient is stable medically; c. echo 01/2015: EF 45-50%, no RWMA, mild AT, LA mildly dilated, mod pericardial effusion along LV free wall, no evidence of hemodynamic compromise  . Cognitive communication deficit   . COPD (chronic obstructive pulmonary disease) ([place])   . Dementia   . Detrusor sphincter dyssynergia   . ESRD on hemodialysis ([place])    a. Tuesday, Thursday, and Saturdays  . GERD (gastroesophageal reflux disease)   . History of septic shock    a. 01/2015  . History of small bowel obstruction    a. 01/2015  . Hydronephrosis   . Hypercholesteremia   . Hypertension   . 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 Incontinence   . Magnesium deficiency   . Mini stroke ([place])   . Overactive bladder   . PAF (paroxysmal atrial fibrillation) ([place])    a. new onset 12/2014 in the setting of UTI, sepsis, hypotension, and anemia; b. not on long term anticoagulation given anemia; c. family aware of stroke risk, they are ok with this; d. on amiodarone   . Prostate cancer [facility])     HOSPITAL COURSE:   75 year old male with past medical history of end-stage renal disease on hemodialysis, paroxysmal atrial fibrillation, history of prostate  cancer, COPD, chronic combined/diastolic CHF, GERD, history of small bowel obstruction who presented to the hospital due to shortness of breath and noted to be in acute respiratory failure.  1. Acute respiratory failure with hypoxia- This was noted to acute on chronic COPD chest x-ray without any evidence of acute CHF Patient's feeling better his shortness of breath is back to baseline denies any chest pains or palpitations Patient will be discharged on prednisone taper his inhalers will be continued Asian had multiple admissions for same he is still full code prognosis overall is poor  2. End-stage renal disease on hemodialysis-patient normally gets dialysis on Tuesday, Thursday, Saturday. -Patient received dialysis yesterday he can resume back to schedule as scheduled  3. COPD exacerbation-- He'll be treated with prednisone taper and nebulizers and antibiotics for acute bronchitis  4. Atrial fibrillation-he is rate controlled. He will Continue amiodarone.  5. Hyperlipidemia- he will continue atorvastatin.  6. Secondary hyperparathyroidism-patient will resume his Renvela.  7. Glaucoma - cont. Latanoprost eye drops.   DISCHARGE CONDITIONS:   Stable.   CONSULTS OBTAINED:  Treatment Team:  Murlean Iba, MD  DRUG ALLERGIES:   Allergies  Allergen Reactions  . Penicillins Hives, Itching and Swelling    Has patient had a PCN reaction causing immediate rash, facial/tongue/throat swelling, SOB or lightheadedness with hypotension: Yes Has patient had a PCN reaction causing severe rash involving mucus membranes or skin necrosis: No Has patient had a PCN  reaction that required hospitalization No Has patient had a PCN reaction occurring within the last 10 years: No If all of the above answers are "NO", then may proceed with Cephalosporin use.    DISCHARGE MEDICATIONS:     Medication List    TAKE these medications   acetaminophen 325 MG tablet Commonly known as:   TYLENOL Take 650 mg by mouth every 8 (eight) hours as needed. For general discomfort   albuterol (2.5 MG/3ML) 0.083% nebulizer solution Commonly known as:  PROVENTIL Take 3 mLs (2.5 mg total) by nebulization every 4 (four) hours as needed for wheezing or shortness of breath.   amiodarone 200 MG tablet Commonly known as:  PACERONE Take 1 tablet (200 mg total) by mouth daily.   atorvastatin 40 MG tablet Commonly known as:  LIPITOR Take 40 mg by mouth at bedtime.   budesonide-formoterol 160-4.5 MCG/ACT inhaler Commonly known as:  SYMBICORT Inhale 2 puffs into the lungs 2 (two) times daily.   carvedilol 3.125 MG tablet Commonly known as:  COREG Take 1 tablet (3.125 mg total) by mouth 2 (two) times daily with a meal.   feeding supplement (PRO-STAT SUGAR FREE 64) Liqd Take 30 mLs by mouth daily with supper.   ferrous sulfate 325 (65 FE) MG tablet Take 325 mg by mouth daily.   guaifenesin 100 MG/5ML syrup Commonly known as:  ROBITUSSIN Take 200 mg by mouth every 4 (four) hours as needed for cough.   guaiFENesin 600 MG 12 hr tablet Commonly known as:  MUCINEX Take 600 mg by mouth 2 (two) times daily. (Take every morning and at bedtime for cough)   ipratropium-albuterol 0.5-2.5 (3) MG/3ML Soln Commonly known as:  DUONEB Take 3 mLs by nebulization 3 (three) times daily.   lamoTRIgine 25 MG tablet Commonly known as:  LAMICTAL Take 50 mg by mouth at bedtime. Start on July 14th.   latanoprost 0.005 % ophthalmic solution Commonly known as:  XALATAN Place 1 drop into both eyes at bedtime.   levofloxacin 250 MG tablet Commonly known as:  LEVAQUIN Take 1 tablet (250 mg total) by mouth every other day. Start taking on:  05/23/2016   LORazepam 0.5 MG tablet Commonly known as:  ATIVAN Take 1 tablet (0.5 mg total) by mouth every 4 (four) hours as needed for anxiety.   magnesium oxide 400 MG tablet Commonly known as:  MAG-OX Take 1 tablet (400 mg total) by mouth every evening.    ondansetron 8 MG tablet Commonly known as:  ZOFRAN Take by mouth every 8 (eight) hours as needed for nausea.   oxyCODONE 5 MG immediate release tablet Commonly known as:  Oxy IR/ROXICODONE Take 1 tablet (5 mg total) by mouth every 6 (six) hours as needed for moderate pain or severe pain.   OXYGEN Inhale 2-3 L into the lungs as needed. For shortness of breath, respiratory distress or O2 sats <90.   POLYETHYLENE GLYCOL 3350 PO Take 17 g by mouth daily as needed. For constipation. *Mix 1 capful in 6-8 ounces of beverage*   predniSONE 50 MG tablet Commonly known as:  DELTASONE Start at 50mg  taper by 10mg  until complete What changed:  medication strength  how much to take  how to take this  when to take this  additional instructions   sevelamer carbonate 800 MG tablet Commonly known as:  RENVELA Take 800 mg by mouth 3 (three) times daily before meals.   tiotropium 18 MCG inhalation capsule Commonly known as:  SPIRIVA Place 1 capsule (  18 mcg total) into inhaler and inhale daily.         DISCHARGE INSTRUCTIONS:   DIET:  Renal diet  DISCHARGE CONDITION:  Stable  ACTIVITY:  Activity as tolerated  OXYGEN:  Home Oxygen: Yes.     Oxygen Delivery: 2 liters/min via Patient connected to nasal cannula oxygen  DISCHARGE LOCATION:  nursing home   If you experience worsening of your admission symptoms, develop shortness of breath, life threatening emergency, suicidal or homicidal thoughts you must seek medical attention immediately by calling 911 or calling your MD immediately  if symptoms less severe.  You Must read complete instructions/literature along with all the possible adverse reactions/side effects for all the Medicines you take and that have been prescribed to you. Take any new Medicines after you have completely understood and accpet all the possible adverse reactions/side effects.   Please note  You were cared for by a hospitalist during your hospital  stay. If you have any questions about your discharge medications or the care you received while you were in the hospital after you are discharged, you can call the unit and asked to speak with the hospitalist on call if the hospitalist that took care of you is not available. Once you are discharged, your primary care physician will handle any further medical issues. Please note that NO REFILLS for any discharge medications will be authorized once you are discharged, as it is imperative that you return to your primary care physician (or establish a relationship with a primary care physician if you do not have one) for your aftercare needs so that they can reassess your need for medications and monitor your lab values.     Today   Pt. Seen at HD today and tolerating HD.  Constipation resolved and now having diarrhea.    VITAL SIGNS:  Blood pressure 124/85, pulse (!) 105, temperature 98 F (36.7 C), temperature source Oral, resp. rate 19, height 6\' 1"  (1.854 m), weight 78.9 kg (173 lb 14.4 oz), SpO2 98 %.  I/O:    Intake/Output Summary (Last 24 hours) at 05/22/16 0851 Last data filed at 05/21/16 1835  Gross per 24 hour  Intake              240 ml  Output             1000 ml  Net             -760 ml    PHYSICAL EXAMINATION:   GENERAL:  75 y.o.-year-old patient lying in the bed in no acute distress.  EYES: Pupils equal, round, reactive to light and accommodation. No scleral icterus. Extraocular muscles intact.  HEENT: Head atraumatic, normocephalic. Oropharynx and nasopharynx clear.  NECK:  Supple, no jugular venous distention. No thyroid enlargement, no tenderness.  LUNGS: Normal breath sounds bilaterally, no wheezing, rhonchi, bibasilar rales. No use of accessory muscles of respiration.  CARDIOVASCULAR: S1, S2 normal. No murmurs, rubs, or gallops.  ABDOMEN: Soft, nontender, nondistended. Bowel sounds present. No organomegaly or mass.  EXTREMITIES: No cyanosis, clubbing or edema b/l.     NEUROLOGIC: Cranial nerves II through XII are intact. No focal Motor or sensory deficits b/l.  Globally weak PSYCHIATRIC: The patient is alert and oriented x 3.  SKIN: No obvious rash, lesion, or ulcer.   Left upper Extremity AV fistula with good bruit and thrill.  DATA REVIEW:   CBC  Recent Labs Lab 05/21/16 0411  WBC 5.3  HGB 8.0*  HCT 24.7*  PLT  125*    Chemistries   Recent Labs Lab 05/20/16 1146 05/21/16 0411  NA 140 141  K 4.3 4.5  CL 98* 99*  CO2 28 27  GLUCOSE 99 218*  BUN 70* 83*  CREATININE 5.62* 6.44*  CALCIUM 8.0* 7.6*  AST 14*  --   ALT 8*  --   ALKPHOS 287*  --   BILITOT 1.0  --     Cardiac Enzymes  Recent Labs Lab 05/20/16 1146  TROPONINI 0.04*    Microbiology Results  Results for orders placed or performed during the hospital encounter of 05/20/16  Blood culture (routine x 2)     Status: None (Preliminary result)   Collection Time: 05/20/16 11:46 AM  Result Value Ref Range Status   Specimen Description BLOOD RIGHT ARM  Final   Special Requests   Final    BOTTLES DRAWN AEROBIC AND ANAEROBIC AER 4ML ANA .5ML   Culture  Setup Time   Final    Organism ID to follow GRAM POSITIVE COCCI AEROBIC BOTTLE ONLY CRITICAL RESULT CALLED TO, READ BACK BY AND VERIFIED WITH: KAREN HAYES AT [id number] 05/21/16 SDR    Culture GRAM POSITIVE COCCI  Final   Report Status PENDING  Incomplete  Blood culture (routine x 2)     Status: None (Preliminary result)   Collection Time: 05/20/16 11:46 AM  Result Value Ref Range Status   Specimen Description BLOOD RIGHT HAND  Final   Special Requests   Final    BOTTLES DRAWN AEROBIC AND ANAEROBIC AER 2ML ANA 1ML   Culture  Setup Time   Final    Organism ID to follow GRAM POSITIVE COCCI AEROBIC BOTTLE ONLY CRITICAL RESULT CALLED TO, READ BACK BY AND VERIFIED WITH:  JODY BAREFOOT 05/21/16 AT 2011 BY HS    Culture GRAM POSITIVE COCCI GRAM POSITIVE COCCI   Final   Report Status PENDING  Incomplete  Blood Culture ID  Panel (Reflexed)     Status: Abnormal   Collection Time: 05/20/16 11:46 AM  Result Value Ref Range Status   Enterococcus species NOT DETECTED NOT DETECTED Final   Listeria monocytogenes NOT DETECTED NOT DETECTED Final   Staphylococcus species DETECTED (A) NOT DETECTED Final    Comment: CRITICAL RESULT CALLED TO, READ BACK BY AND VERIFIED WITH: KAREN HAYES AT [id number] 05/21/16 SDR    Staphylococcus aureus NOT DETECTED NOT DETECTED Final   Methicillin resistance DETECTED (A) NOT DETECTED Final    Comment: CRITICAL RESULT CALLED TO, READ BACK BY AND VERIFIED WITH: KAREN HAYES AT [id number] 05/21/16 SDR    Streptococcus species NOT DETECTED NOT DETECTED Final   Streptococcus agalactiae NOT DETECTED NOT DETECTED Final   Streptococcus pneumoniae NOT DETECTED NOT DETECTED Final   Streptococcus pyogenes NOT DETECTED NOT DETECTED Final   Acinetobacter baumannii NOT DETECTED NOT DETECTED Final   Enterobacteriaceae species NOT DETECTED NOT DETECTED Final   Enterobacter cloacae complex NOT DETECTED NOT DETECTED Final   Escherichia coli NOT DETECTED NOT DETECTED Final   Klebsiella oxytoca NOT DETECTED NOT DETECTED Final   Klebsiella pneumoniae NOT DETECTED NOT DETECTED Final   Proteus species NOT DETECTED NOT DETECTED Final   Serratia marcescens NOT DETECTED NOT DETECTED Final   Haemophilus influenzae NOT DETECTED NOT DETECTED Final   Neisseria meningitidis NOT DETECTED NOT DETECTED Final   Pseudomonas aeruginosa NOT DETECTED NOT DETECTED Final   Candida albicans NOT DETECTED NOT DETECTED Final   Candida glabrata NOT DETECTED NOT DETECTED Final   Candida krusei NOT DETECTED NOT  DETECTED Final   Candida parapsilosis NOT DETECTED NOT DETECTED Final   Candida tropicalis NOT DETECTED NOT DETECTED Final    RADIOLOGY:  Dg Abdomen 1 View  Result Date: 05/20/2016 CLINICAL DATA:  Constipation for 3 days. Last bowel movement 3 days ago. EXAM: ABDOMEN - 1 VIEW COMPARISON:  CTA runoff 01/23/2016.  Chest  radiographs 05/20/2016. FINDINGS: Two erect views of the upper abdomen were obtained. The lower abdomen is not visualized. There are bilateral pleural effusions with bibasilar atelectasis and possible pulmonary edema. The heart is enlarged. No free intraperitoneal air is seen. The visualized bowel gas pattern is normal. Aortic stent graft is partially imaged. IMPRESSION: No evidence of pneumoperitoneum. The visualized bowel gas pattern is normal, although the entire abdomen is not imaged. Electronically Signed   By: Richardean Sale M.D.   On: 05/20/2016 13:54   Dg Chest Portable 1 View  Result Date: 05/20/2016 CLINICAL DATA:  Shortness of breath. EXAM: PORTABLE CHEST 1 VIEW COMPARISON:  Radiograph of May 06, 2016. FINDINGS: Stable cardiomegaly. No pneumothorax is noted. Mild right basilar opacity is noted concerning for edema or atelectasis. No significant pleural effusion is noted. Minimal scarring or subsegmental atelectasis is noted in the left lower lobe. Bony thorax is unremarkable. IMPRESSION: Mild right basilar edema or subsegmental atelectasis. Minimal left basilar subsegmental atelectasis is noted. These are decreased compared to prior exam. Electronically Signed   By: Marijo Conception, M.D.   On: 05/20/2016 11:53      Management plans discussed with the patient, family and they are in agreement.  CODE STATUS:     Code Status Orders        Start     Ordered   05/06/16 2108  Full code  Continuous     05/06/16 2109    Code Status History    Date Active Date Inactive Code Status Order ID Comments User Context   03/30/2016  1:47 PM 04/04/2016  5:48 PM Full Code [id number]  Idelle Crouch, MD Inpatient   03/12/2016  3:28 PM 03/14/2016  9:36 PM Full Code [id number]  Henreitta Leber, MD Inpatient   10/19/2015  9:09 AM 10/21/2015  2:40 AM Full Code [id number]  Harrie Foreman, MD Inpatient   09/11/2015  9:55 AM 09/13/2015  6:51 PM Full Code [id number]  Gladstone Lighter, MD Inpatient    07/23/2015  3:03 PM 07/24/2015  9:39 PM Full Code [id number]  Vaughan Basta, MD Inpatient   06/06/2015  1:40 PM 06/10/2015  8:35 PM Full Code [id number]  Bettey Costa, MD ED   05/04/2015  6:59 PM 05/08/2015  6:09 PM Full Code [id number]  Loletha Grayer, MD ED   03/20/2015  6:35 PM 03/27/2015  8:00 PM Full Code [id number]  Loletha Grayer, MD ED   02/13/2015 10:04 PM 02/17/2015  8:15 PM Full Code [id number]  Hillary Bow, MD Inpatient   01/21/2015 11:45 PM 02/04/2015  9:31 PM Full Code [id number]  Dia Sitter, RN Inpatient      TOTAL TIME TAKING CARE OF THIS PATIENT: 40 minutes.    Dustin Flock M.D on 05/22/2016 at 8:51 AM  Between 7am to 6pm - Pager - [phone]  After 6pm go to [email or web address] - Proofreader  [facility]  Office  [phone]  CC: Primary care physician; Dion Body, MD

## 2016-05-22 NOTE — Progress Notes (Signed)
[facility]  ROUNDING NOTE   Subjective:   Feels better this morning Less SOB 1000 cc removed yesterday  Objective:  Vital signs in last 24 hours:  Temp:  [97.9 F (36.6 C)-98.4 F (36.9 C)] 98 F (36.7 C) (08/30 0420) Pulse Rate:  [99-112] 105 (08/30 0420) Resp:  [17-31] 19 (08/30 0420) BP: (113-131)/(68-86) 124/85 (08/30 0420) SpO2:  [97 %-100 %] 98 % (08/30 0754) Weight:  [78.7 kg (173 lb 8 oz)-80.5 kg (177 lb 7.5 oz)] 78.9 kg (173 lb 14.4 oz) (08/30 0420)  Weight change: -1.601 kg (-3 lb 8.5 oz) Filed Weights   05/21/16 1322 05/21/16 1625 05/22/16 0420  Weight: 80.5 kg (177 lb 7.5 oz) 78.7 kg (173 lb 8 oz) 78.9 kg (173 lb 14.4 oz)    Intake/Output: I/O last 3 completed shifts: In: 240 [P.O.:240] Out: 1000 [Other:1000]   Intake/Output this shift:  Total I/O In: 120 [P.O.:120] Out: 0   Physical Exam: General: No acute distress  Head: Normocephalic, atraumatic. Moist oral mucosal membranes  Eyes: Anicteric  Neck: Supple, trachea midline  Lungs:  Basilar rales b/l , normal effort  Heart: S1S2 no rubs, irregular  Abdomen:  Soft, nontender, Bowel sounds present   Extremities: No peripheral edema.  Neurologic:  alert, oriented  Skin: No lesions  Access: LUE AVF    Basic Metabolic Panel:  Recent Labs Lab 05/20/16 1146 05/21/16 0411  NA 140 141  K 4.3 4.5  CL 98* 99*  CO2 28 27  GLUCOSE 99 218*  BUN 70* 83*  CREATININE 5.62* 6.44*  CALCIUM 8.0* 7.6*    Liver Function Tests:  Recent Labs Lab 05/20/16 1146  AST 14*  ALT 8*  ALKPHOS 287*  BILITOT 1.0  PROT 5.4*  ALBUMIN 2.5*   No results for input(s): LIPASE, AMYLASE in the last 168 hours. No results for input(s): AMMONIA in the last 168 hours.  CBC:  Recent Labs Lab 05/20/16 1146 05/21/16 0411  WBC 9.6 5.3  NEUTROABS 6.8*  --   HGB 8.7* 8.0*  HCT 27.0* 24.7*  MCV 83.9 84.0  PLT 134* 125*    Cardiac Enzymes:  Recent Labs Lab 05/20/16 1146  TROPONINI 0.04*     BNP: Invalid input(s): POCBNP  CBG:  Recent Labs Lab 05/21/16 1711 05/21/16 2047 05/22/16 0726  GLUCAP 215* 255* 219*    Microbiology: Results for orders placed or performed during the hospital encounter of 05/20/16  Blood culture (routine x 2)     Status: None (Preliminary result)   Collection Time: 05/20/16 11:46 AM  Result Value Ref Range Status   Specimen Description BLOOD RIGHT ARM  Final   Special Requests   Final    BOTTLES DRAWN AEROBIC AND ANAEROBIC AER 4ML ANA .5ML   Culture  Setup Time   Final    Organism ID to follow GRAM POSITIVE COCCI AEROBIC BOTTLE ONLY CRITICAL RESULT CALLED TO, READ BACK BY AND VERIFIED WITH: KAREN HAYES AT 1415 05/21/16 SDR    Culture GRAM POSITIVE COCCI  Final   Report Status PENDING  Incomplete  Blood culture (routine x 2)     Status: None (Preliminary result)   Collection Time: 05/20/16 11:46 AM  Result Value Ref Range Status   Specimen Description BLOOD RIGHT HAND  Final   Special Requests   Final    BOTTLES DRAWN AEROBIC AND ANAEROBIC AER 2ML ANA 1ML   Culture  Setup Time   Final    Organism ID to follow [place]  AEROBIC BOTTLE ONLY CRITICAL RESULT CALLED TO, READ BACK BY AND VERIFIED WITH:  JODY BAREFOOT 05/21/16 AT 2011 BY HS    Culture GRAM POSITIVE COCCI GRAM POSITIVE COCCI   Final   Report Status PENDING  Incomplete  Blood Culture ID Panel (Reflexed)     Status: Abnormal   Collection Time: 05/20/16 11:46 AM  Result Value Ref Range Status   Enterococcus species NOT DETECTED NOT DETECTED Final   Listeria monocytogenes NOT DETECTED NOT DETECTED Final   Staphylococcus species DETECTED (A) NOT DETECTED Final    Comment: CRITICAL RESULT CALLED TO, READ BACK BY AND VERIFIED WITH: KAREN HAYES AT 1415 05/21/16 SDR    Staphylococcus aureus NOT DETECTED NOT DETECTED Final   Methicillin resistance DETECTED (A) NOT DETECTED Final    Comment: CRITICAL RESULT CALLED TO, READ BACK BY AND VERIFIED WITH: KAREN HAYES AT  [id number] 05/21/16 SDR    Streptococcus species NOT DETECTED NOT DETECTED Final   Streptococcus agalactiae NOT DETECTED NOT DETECTED Final   Streptococcus pneumoniae NOT DETECTED NOT DETECTED Final   Streptococcus pyogenes NOT DETECTED NOT DETECTED Final   Acinetobacter baumannii NOT DETECTED NOT DETECTED Final   Enterobacteriaceae species NOT DETECTED NOT DETECTED Final   Enterobacter cloacae complex NOT DETECTED NOT DETECTED Final   Escherichia coli NOT DETECTED NOT DETECTED Final   Klebsiella oxytoca NOT DETECTED NOT DETECTED Final   Klebsiella pneumoniae NOT DETECTED NOT DETECTED Final   Proteus species NOT DETECTED NOT DETECTED Final   Serratia marcescens NOT DETECTED NOT DETECTED Final   Haemophilus influenzae NOT DETECTED NOT DETECTED Final   Neisseria meningitidis NOT DETECTED NOT DETECTED Final   Pseudomonas aeruginosa NOT DETECTED NOT DETECTED Final   Candida albicans NOT DETECTED NOT DETECTED Final   Candida glabrata NOT DETECTED NOT DETECTED Final   Candida krusei NOT DETECTED NOT DETECTED Final   Candida parapsilosis NOT DETECTED NOT DETECTED Final   Candida tropicalis NOT DETECTED NOT DETECTED Final    Coagulation Studies: No results for input(s): LABPROT, INR in the last 72 hours.  Urinalysis: No results for input(s): COLORURINE, LABSPEC, PHURINE, GLUCOSEU, HGBUR, BILIRUBINUR, KETONESUR, PROTEINUR, UROBILINOGEN, NITRITE, LEUKOCYTESUR in the last 72 hours.  Invalid input(s): APPERANCEUR    Imaging: Dg Abdomen 1 View  Result Date: 05/20/2016 CLINICAL DATA:  Constipation for 3 days. Last bowel movement 3 days ago. EXAM: ABDOMEN - 1 VIEW COMPARISON:  CTA runoff 01/23/2016.  Chest radiographs 05/20/2016. FINDINGS: Two erect views of the upper abdomen were obtained. The lower abdomen is not visualized. There are bilateral pleural effusions with bibasilar atelectasis and possible pulmonary edema. The heart is enlarged. No free intraperitoneal air is seen. The visualized bowel  gas pattern is normal. Aortic stent graft is partially imaged. IMPRESSION: No evidence of pneumoperitoneum. The visualized bowel gas pattern is normal, although the entire abdomen is not imaged. Electronically Signed   By: Richardean Sale M.D.   On: 05/20/2016 13:54   Dg Chest Portable 1 View  Result Date: 05/20/2016 CLINICAL DATA:  Shortness of breath. EXAM: PORTABLE CHEST 1 VIEW COMPARISON:  Radiograph of May 06, 2016. FINDINGS: Stable cardiomegaly. No pneumothorax is noted. Mild right basilar opacity is noted concerning for edema or atelectasis. No significant pleural effusion is noted. Minimal scarring or subsegmental atelectasis is noted in the left lower lobe. Bony thorax is unremarkable. IMPRESSION: Mild right basilar edema or subsegmental atelectasis. Minimal left basilar subsegmental atelectasis is noted. These are decreased compared to prior exam. Electronically Signed   By: Sabino Dick  Brooke Bonito, M.D.   On: 05/20/2016 11:53     Medications:     . acetylcysteine  4 mL Nebulization BID  . amiodarone  200 mg Oral Daily  . atorvastatin  40 mg Oral QHS  . carvedilol  3.125 mg Oral BID WC  . docusate sodium  100 mg Oral BID  . [START ON 05/23/2016] epoetin (EPOGEN/PROCRIT) injection  4,000 Units Intravenous Q T,Th,Sa-HD  . feeding supplement (PRO-STAT SUGAR FREE 64)  30 mL Oral Q supper  . ferrous sulfate  325 mg Oral Daily  . guaiFENesin  600 mg Oral BID  . heparin subcutaneous  5,000 Units Subcutaneous Q12H  . insulin aspart  0-5 Units Subcutaneous QHS  . insulin aspart  0-9 Units Subcutaneous TID WC  . ipratropium-albuterol  3 mL Nebulization Q6H  . lamoTRIgine  50 mg Oral QHS  . latanoprost  1 drop Both Eyes QHS  . [START ON 05/23/2016] levofloxacin  250 mg Oral Q48H  . magnesium oxide  400 mg Oral QPM  . methylPREDNISolone (SOLU-MEDROL) injection  60 mg Intravenous Q6H  . mometasone-formoterol  2 puff Inhalation BID  . sevelamer carbonate  800 mg Oral TID WC  . sodium chloride  flush  3 mL Intravenous Q12H  . tiotropium  18 mcg Inhalation Daily  . [START ON 05/23/2016] vancomycin  750 mg Intravenous Q T,Th,Sa-HD   sodium chloride, acetaminophen, albuterol, guaiFENesin, LORazepam, ondansetron, oxyCODONE, polyethylene glycol powder, sodium chloride flush  Assessment/ Plan:  75 y.o. 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 ESRD on HD TTS: Admitted for volume overload  -  Dialysis today -  Patient under his dry weight of 82 kg. Currently 79 kg -  BP low normal. Will carefully set UF goal of 1.5-2 kg as tolerated for today as well   2. Anemia of chronic kidney disease. . - continue EPO with HD  3. Secondary hyperparathyroidism.  Monitor phos   Emogene Muratalla 8/30/201710:56 AM

## 2016-05-22 NOTE — Progress Notes (Signed)
Report called to d. Beckie Salts at [facility]. Patients status gone over with nurse. Packet will be given to ems, ems was called for transport

## 2016-05-22 NOTE — Progress Notes (Signed)
Patient returned to room from dialysis. No c/o pain. Voice hoarse. Remains on 02, lungs coarse throughout. Telemetry reading afib 112.

## 2016-05-22 NOTE — Discharge Instructions (Signed)
°  DIET:  Renal diet  DISCHARGE CONDITION:  Stable  ACTIVITY:  Activity as tolerated  OXYGEN:  Home Oxygen: Yes.     Oxygen Delivery: 3 liters/min via Patient connected to nasal cannula oxygen  DISCHARGE LOCATION:  nursing home    ADDITIONAL DISCHARGE INSTRUCTION:   If you experience worsening of your admission symptoms, develop shortness of breath, life threatening emergency, suicidal or homicidal thoughts you must seek medical attention immediately by calling 911 or calling your MD immediately  if symptoms less severe.  You Must read complete instructions/literature along with all the possible adverse reactions/side effects for all the Medicines you take and that have been prescribed to you. Take any new Medicines after you have completely understood and accpet all the possible adverse reactions/side effects.   Please note  You were cared for by a hospitalist during your hospital stay. If you have any questions about your discharge medications or the care you received while you were in the hospital after you are discharged, you can call the unit and asked to speak with the hospitalist on call if the hospitalist that took care of you is not available. Once you are discharged, your primary care physician will handle any further medical issues. Please note that NO REFILLS for any discharge medications will be authorized once you are discharged, as it is imperative that you return to your primary care physician (or establish a relationship with a primary care physician if you do not have one) for your aftercare needs so that they can reassess your need for medications and monitor your lab values.

## 2016-05-23 LAB — CULTURE, BLOOD (ROUTINE X 2)

## 2016-05-31 ENCOUNTER — Inpatient Hospital Stay: Payer: Medicare Other

## 2016-05-31 ENCOUNTER — Other Ambulatory Visit: Payer: Self-pay | Admitting: Hematology and Oncology

## 2016-05-31 ENCOUNTER — Other Ambulatory Visit: Payer: Self-pay | Admitting: *Deleted

## 2016-05-31 ENCOUNTER — Other Ambulatory Visit: Payer: Self-pay

## 2016-05-31 ENCOUNTER — Inpatient Hospital Stay: Payer: Medicare Other | Attending: Hematology and Oncology | Admitting: Hematology and Oncology

## 2016-05-31 VITALS — BP 128/81 | HR 88 | Temp 96.3°F | Resp 18 | Wt 201.5 lb

## 2016-05-31 DIAGNOSIS — C61 Malignant neoplasm of prostate: Secondary | ICD-10-CM

## 2016-05-31 DIAGNOSIS — Z87891 Personal history of nicotine dependence: Secondary | ICD-10-CM | POA: Insufficient documentation

## 2016-05-31 DIAGNOSIS — D649 Anemia, unspecified: Secondary | ICD-10-CM

## 2016-05-31 DIAGNOSIS — D631 Anemia in chronic kidney disease: Secondary | ICD-10-CM | POA: Diagnosis not present

## 2016-05-31 DIAGNOSIS — I724 Aneurysm of artery of lower extremity: Secondary | ICD-10-CM | POA: Insufficient documentation

## 2016-05-31 DIAGNOSIS — Z8551 Personal history of malignant neoplasm of bladder: Secondary | ICD-10-CM | POA: Diagnosis not present

## 2016-05-31 DIAGNOSIS — R948 Abnormal results of function studies of other organs and systems: Secondary | ICD-10-CM | POA: Diagnosis not present

## 2016-05-31 DIAGNOSIS — Z8673 Personal history of transient ischemic attack (TIA), and cerebral infarction without residual deficits: Secondary | ICD-10-CM | POA: Diagnosis not present

## 2016-05-31 DIAGNOSIS — I5042 Chronic combined systolic (congestive) and diastolic (congestive) heart failure: Secondary | ICD-10-CM

## 2016-05-31 DIAGNOSIS — C679 Malignant neoplasm of bladder, unspecified: Secondary | ICD-10-CM

## 2016-05-31 DIAGNOSIS — F0390 Unspecified dementia without behavioral disturbance: Secondary | ICD-10-CM | POA: Insufficient documentation

## 2016-05-31 DIAGNOSIS — N1330 Unspecified hydronephrosis: Secondary | ICD-10-CM | POA: Diagnosis not present

## 2016-05-31 DIAGNOSIS — N3281 Overactive bladder: Secondary | ICD-10-CM | POA: Diagnosis not present

## 2016-05-31 DIAGNOSIS — K219 Gastro-esophageal reflux disease without esophagitis: Secondary | ICD-10-CM | POA: Insufficient documentation

## 2016-05-31 DIAGNOSIS — C7951 Secondary malignant neoplasm of bone: Secondary | ICD-10-CM

## 2016-05-31 DIAGNOSIS — Z880 Allergy status to penicillin: Secondary | ICD-10-CM | POA: Insufficient documentation

## 2016-05-31 DIAGNOSIS — I480 Paroxysmal atrial fibrillation: Secondary | ICD-10-CM

## 2016-05-31 DIAGNOSIS — N184 Chronic kidney disease, stage 4 (severe): Secondary | ICD-10-CM

## 2016-05-31 DIAGNOSIS — Z992 Dependence on renal dialysis: Secondary | ICD-10-CM | POA: Diagnosis not present

## 2016-05-31 DIAGNOSIS — J449 Chronic obstructive pulmonary disease, unspecified: Secondary | ICD-10-CM | POA: Insufficient documentation

## 2016-05-31 DIAGNOSIS — I132 Hypertensive heart and chronic kidney disease with heart failure and with stage 5 chronic kidney disease, or end stage renal disease: Secondary | ICD-10-CM | POA: Insufficient documentation

## 2016-05-31 DIAGNOSIS — Z79818 Long term (current) use of other agents affecting estrogen receptors and estrogen levels: Secondary | ICD-10-CM | POA: Insufficient documentation

## 2016-05-31 DIAGNOSIS — Z79899 Other long term (current) drug therapy: Secondary | ICD-10-CM | POA: Diagnosis not present

## 2016-05-31 LAB — FERRITIN: Ferritin: 668 ng/mL — ABNORMAL HIGH (ref 24–336)

## 2016-05-31 LAB — CBC WITH DIFFERENTIAL/PLATELET
Basophils Absolute: 0 10*3/uL (ref 0–0.1)
Basophils Relative: 1 %
Eosinophils Absolute: 0.3 10*3/uL (ref 0–0.7)
Eosinophils Relative: 6 %
HCT: 28.1 % — ABNORMAL LOW (ref 40.0–52.0)
Hemoglobin: 8.8 g/dL — ABNORMAL LOW (ref 13.0–18.0)
Lymphocytes Relative: 14 %
Lymphs Abs: 0.7 10*3/uL — ABNORMAL LOW (ref 1.0–3.6)
MCH: 27.1 pg (ref 26.0–34.0)
MCHC: 31.4 g/dL — ABNORMAL LOW (ref 32.0–36.0)
MCV: 86.4 fL (ref 80.0–100.0)
Monocytes Absolute: 1 10*3/uL (ref 0.2–1.0)
Monocytes Relative: 19 %
Neutro Abs: 3.2 10*3/uL (ref 1.4–6.5)
Neutrophils Relative %: 60 %
Platelets: 196 10*3/uL (ref 150–440)
RBC: 3.25 MIL/uL — ABNORMAL LOW (ref 4.40–5.90)
RDW: 20.7 % — ABNORMAL HIGH (ref 11.5–14.5)
WBC: 5.3 10*3/uL (ref 3.8–10.6)

## 2016-05-31 LAB — COMPREHENSIVE METABOLIC PANEL
ALT: 6 U/L — ABNORMAL LOW (ref 17–63)
AST: 14 U/L — ABNORMAL LOW (ref 15–41)
Albumin: 2.6 g/dL — ABNORMAL LOW (ref 3.5–5.0)
Alkaline Phosphatase: 210 U/L — ABNORMAL HIGH (ref 38–126)
Anion gap: 10 (ref 5–15)
BUN: 52 mg/dL — ABNORMAL HIGH (ref 6–20)
CO2: 33 mmol/L — ABNORMAL HIGH (ref 22–32)
Calcium: 8.5 mg/dL — ABNORMAL LOW (ref 8.9–10.3)
Chloride: 96 mmol/L — ABNORMAL LOW (ref 101–111)
Creatinine, Ser: 4.1 mg/dL — ABNORMAL HIGH (ref 0.61–1.24)
GFR calc Af Amer: 15 mL/min — ABNORMAL LOW (ref >60)
GFR calc non Af Amer: 13 mL/min — ABNORMAL LOW (ref >60)
Glucose, Bld: 104 mg/dL — ABNORMAL HIGH (ref 65–99)
Potassium: 3.5 mmol/L (ref 3.5–5.1)
Sodium: 139 mmol/L (ref 135–145)
Total Bilirubin: 0.5 mg/dL (ref 0.3–1.2)
Total Protein: 5.7 g/dL — ABNORMAL LOW (ref 6.5–8.1)

## 2016-05-31 LAB — IRON AND TIBC
Iron: 19 ug/dL — ABNORMAL LOW (ref 45–182)
Saturation Ratios: 9 % — ABNORMAL LOW (ref 17.9–39.5)
TIBC: 225 ug/dL — ABNORMAL LOW (ref 250–450)
UIBC: 206 ug/dL

## 2016-05-31 LAB — PSA: PSA: 2.44 ng/mL (ref 0.00–4.00)

## 2016-05-31 MED ORDER — LEUPROLIDE ACETATE (6 MONTH) 45 MG IM KIT
45.0000 mg | PACK | Freq: Once | INTRAMUSCULAR | Status: AC
  Administered 2016-05-31: 45 mg via INTRAMUSCULAR
  Filled 2016-05-31: qty 45

## 2016-05-31 NOTE — Progress Notes (Signed)
[facility] day:  05/31/2016   Chief Complaint: [name] is a 75 y.o. male with end stage renal disease on dialysis, prostate and bladder cancer, who is seen for reassessment.  HPI: The patient was last seen by me in the medical oncology on 06/19/2015.  At that time, he was seen for assessment after an admission for symptomatic anemia (hematocrit 20.5 and hemoglobin of 6.4).  He received 2 units of packed red blood cells.  The etiology of his anemia was felt to be multifactorial. He had anemia of chronic renal disease.  By his history, he was receiving Procrit. He may have had some component of GI blood loss.  Diet was good.   He has prostate cancer.  He was receiving Lupron every 6 months.  Discussions were held regarding consideration of Casodex if his disease progressed in the future.  He had a history of a bladder cancer.  Urology notes indicated a cystostomy with a bladder biopsy on 10/01/2011.  Notes from 08/09/2014 revealed persistent hydronephrosis and bilateral stents.  He was seen by Dr. Delight Clarke on 11/08/2015.  At that time, he noted chronic weakness and fatigue. He was receiving dialysis every Tuesday, Thursday, and Saturday.  CBC revealed a hematocrit 24.1, hemoglobin 7.5, platelets 241,000, white count 5800 with an ANC of 3500.  Iron studies included a saturation of 10% (low) and a TIBC of 209 (low).  Ferritin was 764.  PSA was 2.  He received a 6 month Lupron injection (45 mg).  The patient states that he has been doing about the same.  He has not required a blood transfusion in a year. He has COPD causing him to be "in and out of the hospital". He was last admitted to [facility] from 05/20/2016 - 05/22/2016 with a COPD exacerbation.  He is currently staying at [facility] and is doing "okay". He denies any pain.  He commnets that he is on a restricted diet as well as fluids.  He denies any melena or hematochezia.  He denies any  hematuria.   Past Medical History:  Diagnosis Date  . Allergic rhinitis   . Anemia    a. baseline hgb ~ 8  . Bladder cancer ([place])   . Chronic combined systolic and diastolic CHF (congestive heart failure) ([place])    a. echo 12/2014: EF 25-30%, anterior wall wall motion abnormalities; b. planned ischemic evaluation once patient is stable medically; c. echo 01/2015: EF 45-50%, no RWMA, mild AT, LA mildly dilated, mod pericardial effusion along LV free wall, no evidence of hemodynamic compromise  . Cognitive communication deficit   . COPD (chronic obstructive pulmonary disease) ([place])   . Dementia   . Detrusor sphincter dyssynergia   . ESRD on hemodialysis ([place])    a. Tuesday, Thursday, and Saturdays  . GERD (gastroesophageal reflux disease)   . History of septic shock    a. 01/2015  . History of small bowel obstruction    a. 01/2015  . Hydronephrosis   . Hypercholesteremia   . Hypertension   . 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 Incontinence   . Magnesium deficiency   . Mini stroke ([place])   . Overactive bladder   . PAF (paroxysmal atrial fibrillation) ([place])    a. new onset 12/2014 in the setting of UTI, sepsis, hypotension, and anemia; b. not on long term anticoagulation given anemia; c. family aware of stroke risk, they are ok with this; d. on amiodarone   .  Prostate cancer [facility])     Past Surgical History:  Procedure Laterality Date  . AV FISTULA PLACEMENT     left arm  . CYSTOSCOPY W/ URETERAL STENT PLACEMENT    . PERIPHERAL VASCULAR CATHETERIZATION N/A 01/24/2016   Procedure: Endovascular Repair/Stent Graft;  Surgeon: Katha Cabal, MD;  Location: [facility];  Service: Cardiovascular;  Laterality: N/A;  . PROSTATE SURGERY     Removal  . TRANSURETHRAL RESECTION OF BLADDER TUMOR WITH GYRUS (TURBT-GYRUS)    . URETERAL STENT PLACEMENT      Family History  Problem Relation Age of Onset  . Stroke Mother     Social History:  reports that he quit smoking about 12 months ago. He  has a 14.00 pack-year smoking history. He has never used smokeless tobacco. He reports that he does not drink alcohol or use drugs.  Contact number is [phone].  The patient is accompanied by a woman today.  His medical power of attorney is [name].  Allergies:  Allergies  Allergen Reactions  . Penicillins Hives, Itching and Swelling    Has patient had a PCN reaction causing immediate rash, facial/tongue/throat swelling, SOB or lightheadedness with hypotension: Yes Has patient had a PCN reaction causing severe rash involving mucus membranes or skin necrosis: No Has patient had a PCN reaction that required hospitalization No Has patient had a PCN reaction occurring within the last 10 years: No If all of the above answers are "NO", then may proceed with Cephalosporin use.    Current Medications: Current Outpatient Prescriptions  Medication Sig Dispense Refill  . acetaminophen (TYLENOL) 325 MG tablet Take 650 mg by mouth every 8 (eight) hours as needed. For general discomfort    . albuterol (PROVENTIL) (2.5 MG/3ML) 0.083% nebulizer solution Take 3 mLs (2.5 mg total) by nebulization every 4 (four) hours as needed for wheezing or shortness of breath. 75 mL 12  . amiodarone (PACERONE) 200 MG tablet Take 1 tablet (200 mg total) by mouth daily. 30 tablet 5  . atorvastatin (LIPITOR) 40 MG tablet Take 40 mg by mouth at bedtime.    . carvedilol (COREG) 3.125 MG tablet Take 1 tablet (3.125 mg total) by mouth 2 (two) times daily with a meal. 60 tablet 2  . ferrous sulfate 325 (65 FE) MG tablet Take 325 mg by mouth daily.    Marland Kitchen guaiFENesin (MUCINEX) 600 MG 12 hr tablet Take 600 mg by mouth 2 (two) times daily. (Take every morning and at bedtime for cough)    . guaifenesin (ROBITUSSIN) 100 MG/5ML syrup Take 200 mg by mouth every 4 (four) hours as needed for cough.    Marland Kitchen ipratropium-albuterol (DUONEB) 0.5-2.5 (3) MG/3ML SOLN Take 3 mLs by nebulization 3 (three) times daily.    Marland Kitchen lamoTRIgine (LAMICTAL)  25 MG tablet Take 50 mg by mouth at bedtime. Start on July 14th.    . latanoprost (XALATAN) 0.005 % ophthalmic solution Place 1 drop into both eyes at bedtime.    Marland Kitchen LORazepam (ATIVAN) 0.5 MG tablet Take 1 tablet (0.5 mg total) by mouth every 4 (four) hours as needed for anxiety. 30 tablet 0  . magnesium oxide (MAG-OX) 400 MG tablet Take 1 tablet (400 mg total) by mouth every evening. 30 tablet 2  . ondansetron (ZOFRAN) 8 MG tablet Take by mouth every 8 (eight) hours as needed for nausea.    Marland Kitchen oxyCODONE (OXY IR/ROXICODONE) 5 MG immediate release tablet Take 1 tablet (5 mg total) by mouth every 6 (six)  hours as needed for moderate pain or severe pain. 20 tablet 0  . OXYGEN Inhale 2-3 L into the lungs as needed. For shortness of breath, respiratory distress or O2 sats <90.    Marland Kitchen POLYETHYLENE GLYCOL 3350 PO Take 17 g by mouth daily as needed. For constipation. *Mix 1 capful in 6-8 ounces of beverage*    . sevelamer carbonate (RENVELA) 800 MG tablet Take 800 mg by mouth 3 (three) times daily before meals.     . tiotropium (SPIRIVA) 18 MCG inhalation capsule Place 1 capsule (18 mcg total) into inhaler and inhale daily. 30 capsule 12   No current facility-administered medications for this visit.     Review of Systems:  GENERAL:  Feels "ok".  No fevers, sweats or weight loss. PERFORMANCE STATUS (ECOG):  3 HEENT:  No visual changes, runny nose, sore throat, mouth sores or tenderness. Lungs: COPD on oxygen (2 liter/min). Shortness of breath at times.  No cough.  No hemoptysis. Cardiac:  No chest pain, palpitations, orthopnea, or PND. GI:  No abdominal pain.  No nausea, vomiting, melena or hematochezia. GU:  No urgency, frequency, dysuria, or hematuria. Musculoskeletal:  No back pain.  No joint pain.  No muscle tenderness. Extremities:  No pain or swelling. Skin:  No rashes or skin changes. Neuro:  No headache, numbness or weakness, balance or coordination issues. Endocrine:  No diabetes, thyroid  issues, hot flashes or night sweats. Psych:  No mood changes, depression or anxiety. Pain:  No focal pain. Review of systems:  All other systems reviewed and found to be negative.  Physical Exam: Blood pressure 128/81, pulse 88, temperature (!) 96.3 F (35.7 C), temperature source Tympanic, resp. rate 18, weight 201 lb 8 oz (91.4 kg). GENERAL:  Well developed, well nourished, gentleman sitting comfortably in a high back wheelchair in the exam room in no acute distress. MENTAL STATUS:  Alert and oriented to person, place and time. HEAD:  Wearing a gray golf cap.  Gray hair with gray goatee.  Normocephalic, atraumatic, face symmetric, no Cushingoid features. EYES:  Dark rimmed glasses.  Brown eyes.  Pupils equal round and reactive to light and accomodation.  No conjunctivitis or scleral icterus. ENT:  [place] in place.  Oropharynx clear without lesion.  Missing teeth.  Tongue normal. Mucous membranes moist.  Speaks in short sentences. RESPIRATORY:  Decreased breath sounds right lower lobe.  Otherwise, clear to auscultation without rales, wheezes or rhonchi. CARDIOVASCULAR:  Regular rate and rhythm without murmur, rub or gallop. ABDOMEN:  Soft, non-tender, with active bowel sounds, and no appreciable hepatosplenomegaly.  No masses. SKIN:  No rashes, ulcers or lesions. EXTREMITIES: No edema, no skin discoloration or tenderness.  No palpable cords. LYMPH NODES: No palpable cervical, supraclavicular, axillary or inguinal adenopathy  NEUROLOGICAL: Unremarkable. PSYCH:  Appropriate.   Orders Only on 05/31/2016  Component Date Value Ref Range Status  . WBC 05/31/2016 5.3  3.8 - 10.6 K/uL Final  . RBC 05/31/2016 3.25* 4.40 - 5.90 MIL/uL Final  . Hemoglobin 05/31/2016 8.8* 13.0 - 18.0 g/dL Final  . HCT 05/31/2016 28.1* 40.0 - 52.0 % Final  . MCV 05/31/2016 86.4  80.0 - 100.0 fL Final  . MCH 05/31/2016 27.1  26.0 - 34.0 pg Final  . MCHC 05/31/2016 31.4* 32.0 - 36.0 g/dL Final  . RDW 05/31/2016 20.7*  11.5 - 14.5 % Final  . Platelets 05/31/2016 196  150 - 440 K/uL Final  . Neutrophils Relative % 05/31/2016 60  % Final  .  Neutro Abs 05/31/2016 3.2  1.4 - 6.5 K/uL Final  . Lymphocytes Relative 05/31/2016 14  % Final  . Lymphs Abs 05/31/2016 0.7* 1.0 - 3.6 K/uL Final  . Monocytes Relative 05/31/2016 19  % Final  . Monocytes Absolute 05/31/2016 1.0  0.2 - 1.0 K/uL Final  . Eosinophils Relative 05/31/2016 6  % Final  . Eosinophils Absolute 05/31/2016 0.3  0 - 0.7 K/uL Final  . Basophils Relative 05/31/2016 1  % Final  . Basophils Absolute 05/31/2016 0.0  0 - 0.1 K/uL Final  . Sodium 05/31/2016 139  135 - 145 mmol/L Final  . Potassium 05/31/2016 3.5  3.5 - 5.1 mmol/L Final  . Chloride 05/31/2016 96* 101 - 111 mmol/L Final  . CO2 05/31/2016 33* 22 - 32 mmol/L Final  . Glucose, Bld 05/31/2016 104* 65 - 99 mg/dL Final  . BUN 05/31/2016 52* 6 - 20 mg/dL Final  . Creatinine, Ser 05/31/2016 4.10* 0.61 - 1.24 mg/dL Final  . Calcium 05/31/2016 8.5* 8.9 - 10.3 mg/dL Final  . Total Protein 05/31/2016 5.7* 6.5 - 8.1 g/dL Final  . Albumin 05/31/2016 2.6* 3.5 - 5.0 g/dL Final  . AST 05/31/2016 14* 15 - 41 U/L Final  . ALT 05/31/2016 6* 17 - 63 U/L Final  . Alkaline Phosphatase 05/31/2016 210* 38 - 126 U/L Final  . Total Bilirubin 05/31/2016 0.5  0.3 - 1.2 mg/dL Final  . GFR calc non Af Amer 05/31/2016 13* >60 mL/min Final  . GFR calc Af Amer 05/31/2016 15* >60 mL/min Final   Comment: (NOTE) The eGFR has been calculated using the CKD EPI equation. This calculation has not been validated in all clinical situations. eGFR's persistently <60 mL/min signify possible Chronic Kidney Disease.   . Anion gap 05/31/2016 10  5 - 15 Final  Outside labs from the dialysis center on 06/20/2015 revealed a hematocrit of 23.4 and hemoglobin of 7.8   Assessment:  [name] is a 75 y.o. male with end stage renal disease on dialysis since 06/2014.  The etiology of his renal disease is unclear.  He has  stage IV prostate cancer.  PET scan on 06/2011 had revealed lymphadenopathy with sclerotic bone metastasis consistent with probable prostate cancer. There was bilateral hydronephrosis with bladder base thickening from probable prostate ingrowth. Bone scan 07/30/2011 revealed multiple areas consistent with metastatic disease.  He receives Lupron every 6 months.  PSA was 12 on 07/23/2011, 2.15 on 01/30/2015, and 1.44 on 06/08/2015, 2.00 on 11/08/2015, and 2.44 on 05/31/2016.  He has a history of bladder cancer and is status post transurethral resection of bladder tumor (TURBT). He has stents in place.  He denies any hematuria.  He had a colonoscopy in 2012 which was negative. He is not felt to be a good candidate for sedation (further endoscopies) secondary to his multiple morbidities.  Labs on 06/07/2014 revealed a ferritin of 6660 (prior value of 838 on 05/04/2015). Sedimentation rate was 80. Folate was 20.  Reticulocyte count was 3.4%, TSH was 1.07, ANA was negative.  Haptoglobin was 225 (normal), LDH 203 (98-192).  B12 was normal on 05/04/2015.  He has multifactorial anemia. He has anemia of chronic renal disease.  By his history, he is receiving Procrit.  He has not received PRBCs since 05/2015.  He may have some component of GI blood loss.  Diet is good. He denies any melena or hematochezia.    Symptomatically, he struggles with COPD.  He has chronic chronic shortness of breath.  Hematocrit  has improved from 24.1 to 28.1.  Plan: 1.  Review interim events. 2.  Discuss ongoing management of prostate cancer.  Discuss continuation of Lupron.  Discuss imaging and consideration of Casodex if PSA climbing. 3.  Lupron today 4.  RTC in 6 months for MD assessment, labs (CBC with diff, CMP, PSA, ferritin) + Lupron  Addendum:  Will discuss with patient's medical power of attorney restaging bone scan.   Lequita Asal, MD  05/31/2016, 12:35 PM

## 2016-06-02 ENCOUNTER — Encounter: Payer: Self-pay | Admitting: Hematology and Oncology

## 2016-06-03 ENCOUNTER — Telehealth: Payer: Self-pay | Admitting: *Deleted

## 2016-06-03 ENCOUNTER — Other Ambulatory Visit: Payer: Self-pay | Admitting: Hematology and Oncology

## 2016-06-03 LAB — PROTEIN ELECTROPHORESIS, SERUM
A/G Ratio: 1.3 (ref 0.7–1.7)
Albumin ELP: 2.9 g/dL (ref 2.9–4.4)
Alpha-1-Globulin: 0.4 g/dL (ref 0.0–0.4)
Alpha-2-Globulin: 0.6 g/dL (ref 0.4–1.0)
Beta Globulin: 0.8 g/dL (ref 0.7–1.3)
Gamma Globulin: 0.5 g/dL (ref 0.4–1.8)
Globulin, Total: 2.3 g/dL (ref 2.2–3.9)
Total Protein ELP: 5.2 g/dL — ABNORMAL LOW (ref 6.0–8.5)

## 2016-06-03 LAB — KAPPA/LAMBDA LIGHT CHAINS
Kappa free light chain: 89.9 mg/L — ABNORMAL HIGH (ref 3.3–19.4)
Kappa, lambda light chain ratio: 0.87 (ref 0.26–1.65)
Lambda free light chains: 102.9 mg/L — ABNORMAL HIGH (ref 5.7–26.3)

## 2016-06-03 NOTE — Telephone Encounter (Signed)
Called pt but number no longer working number, called emergency contact Ida and left her a message to call me back when she gets this message. The number I have for the pt is no longer a working number. Or she could pass message to pt to call me

## 2016-06-03 NOTE — Telephone Encounter (Signed)
-----   Message from Lequita Asal, MD sent at 06/02/2016  8:28 PM EDT ----- Regarding: Bone scan  Review of chart appears that he has not had a bone scan for his prostate cancer in a long time.  We should order a bone scan at minimum annually.  Last time I saw him, we were consulted about anemia.  Now we are treating his prostate cancer.  Order in place if he would like to proceed.  M

## 2016-06-04 ENCOUNTER — Telehealth: Payer: Self-pay | Admitting: *Deleted

## 2016-06-04 NOTE — Telephone Encounter (Signed)
rcvd call from [place] and she reminded me that pt is now at [facility] and it will be fine for him to have bone scan. She states to arrange it and let [facility] know so they will transport him.  Make sure to avoid his dialysis days of Tues, Thurs, Sat.  I gave the info to [facility] she got the bone scan scheduled and called staff at [facility] so they will arrange transportation

## 2016-06-04 NOTE — Telephone Encounter (Signed)
-----   Message from Lequita Asal, MD sent at 06/03/2016  3:28 PM EDT ----- Regarding: RE: Bone scan  Order is in.  M  ----- Message ----- From: Luella Cook, RN Sent: 06/03/2016   2:26 PM To: Lequita Asal, MD Subject: RE: Bone scan                                  [name] called back and said that the pt is at [facility] and he will be fine with getting scan. If you order it then we will schedule it and call [facility] to make sure they can transport pt. ----- Message ----- From: Lequita Asal, MD Sent: 06/02/2016   8:28 PM To: Shirlean Kelly, RN, Luella Cook, RN Subject: Bone scan                                       Review of chart appears that he has not had a bone scan for his prostate cancer in a long time.  We should order a bone scan at minimum annually.  Last time I saw him, we were consulted about anemia.  Now we are treating his prostate cancer.  Order in place if he would like to proceed.  M

## 2016-06-06 ENCOUNTER — Inpatient Hospital Stay: Payer: Medicare Other | Admitting: Internal Medicine

## 2016-06-07 NOTE — Progress Notes (Signed)
* [facility]     Assessment and Plan:  COPD.  --Uncertain of what degree this is, I suspect it is likely contributing to his dyspnea but is not the main problems.   Chronic pulmonary edema.  --He has crackles in his lungs today indicative of pulmonary edema, I suspect that this is the main cause of his dyspnea, continue currently management.  --Patient should be on diuretic if not already, no medication list was provided from nursing home today.   Hoarseness.  --Chronic and progressive hoarseness of voice.  --ENT eval may be helpful, though he does not appear to be in adequate condition to undergo any procedure at this time.   Debility/Deconditioning.  --Significant deconditioning, he is at risk of further decline in his respiratory status and overall medical status. A palliative care/hospice consultation may be helpful.  --Recommend that [name], his cousin be made his medical POA as per his request.   Chronic hypoxic respiratory failure.  -Multifactorial due to pulmonary edema, volume overload, COPD.   Date: 06/07/2016  MRN# [id number] [name] [birth year]   [name] is a 75 y.o. old male seen in follow up for chief complaint of  Chief Complaint  Patient presents with  . Hospitalization Follow-up    feels he is doing well snce leaving hosp; cough prod at time w/clear to yellow mucus; SOB at baseline     HPI:   The patient is a 75 yo male with a history of ESRD with CHF, COPD, vasculopathy, chronic respiratory failure. He has been admitted 5 times in the past 4 months with various combinations of vascular disease, as well as CHF, volume overload, and COPD exacerbations. At last encounter with [place], he was seen in the hospital for chronic respiratory failure with COPD, he was asked to continue symbicort, spiriva, albuterol.   He comes from a nursing home today, he is in a wheelchair. He has profound weakness and hoarseness of voice. He has a  history of vocal cord polyps, these were "burned off" about 10 years ago and the hoarseness went away but has slowly been coming back over the following few years.   He takes a nebulizer in the morning as needed at the nursing home, but not sure that it helps.   His cousin, [name] helps him manage affairs and indicates that she should be the person to make decisions if he is not able.   Advanced care planning:  We discussed what he would want if his heart stops or his breathing requires a ventilator. We discussed whether he would want CPR or ventlatory support, he notes that he would not be a burden or be kept alive by machine, and therefore if it would come to the point that he needed CPR or a ventilator/intubation he notes that he would rather that we "dig the hole" . He notes that he is not afraid to die, and "when it's time to go, it's time to go" He has spoken with [name] about this briefly, I recommended that he discuss this further with [name] so that she knows his wishes.  Time spent 30 min.    Medication:   Outpatient Encounter Prescriptions as of 06/10/2016  Medication Sig  . acetaminophen (TYLENOL) 325 MG tablet Take 650 mg by mouth every 8 (eight) hours as needed. For general discomfort  . albuterol (PROVENTIL) (2.5 MG/3ML) 0.083% nebulizer solution Take 3 mLs (2.5 mg total) by nebulization every 4 (four) hours as needed for wheezing  or shortness of breath.  Marland Kitchen amiodarone (PACERONE) 200 MG tablet Take 1 tablet (200 mg total) by mouth daily.  Marland Kitchen atorvastatin (LIPITOR) 40 MG tablet Take 40 mg by mouth at bedtime.  . carvedilol (COREG) 3.125 MG tablet Take 1 tablet (3.125 mg total) by mouth 2 (two) times daily with a meal.  . ferrous sulfate 325 (65 FE) MG tablet Take 325 mg by mouth daily.  Marland Kitchen guaiFENesin (MUCINEX) 600 MG 12 hr tablet Take 600 mg by mouth 2 (two) times daily. (Take every morning and at bedtime for cough)  . guaifenesin (ROBITUSSIN) 100 MG/5ML syrup Take 200 mg by mouth every 4  (four) hours as needed for cough.  Marland Kitchen ipratropium-albuterol (DUONEB) 0.5-2.5 (3) MG/3ML SOLN Take 3 mLs by nebulization 3 (three) times daily.  Marland Kitchen lamoTRIgine (LAMICTAL) 25 MG tablet Take 50 mg by mouth at bedtime. Start on July 14th.  . latanoprost (XALATAN) 0.005 % ophthalmic solution Place 1 drop into both eyes at bedtime.  Marland Kitchen LORazepam (ATIVAN) 0.5 MG tablet Take 1 tablet (0.5 mg total) by mouth every 4 (four) hours as needed for anxiety.  . magnesium oxide (MAG-OX) 400 MG tablet Take 1 tablet (400 mg total) by mouth every evening.  . ondansetron (ZOFRAN) 8 MG tablet Take by mouth every 8 (eight) hours as needed for nausea.  Marland Kitchen oxyCODONE (OXY IR/ROXICODONE) 5 MG immediate release tablet Take 1 tablet (5 mg total) by mouth every 6 (six) hours as needed for moderate pain or severe pain.  . OXYGEN Inhale 2-3 L into the lungs as needed. For shortness of breath, respiratory distress or O2 sats <90.  Marland Kitchen POLYETHYLENE GLYCOL 3350 PO Take 17 g by mouth daily as needed. For constipation. *Mix 1 capful in 6-8 ounces of beverage*  . sevelamer carbonate (RENVELA) 800 MG tablet Take 800 mg by mouth 3 (three) times daily before meals.   . tiotropium (SPIRIVA) 18 MCG inhalation capsule Place 1 capsule (18 mcg total) into inhaler and inhale daily.   No facility-administered encounter medications on file as of 06/10/2016.      Allergies:  Penicillins  Review of Systems: Gen:  Denies  fever, sweats. HEENT: Denies blurred vision. Has hoarseness.  Cvc:  No dizziness, chest pain or heaviness Resp:   Denies cough or sputum porduction. Gi: Denies swallowing difficulty, stomach pain.  Other:  All other systems were reviewed and found to be negative other than what is mentioned in the HPI.   Physical Examination:   VS: BP (!) 110/58 (BP Location: Right Arm, Cuff Size: Normal)   Pulse 97   SpO2 97%  @ 2L General Appearance: No distress  Neuro:without focal findings,  speech normal,  HEENT: PERRLA, EOM intact.  Has hoarseness of voice.  Pulmonary: normal breath sounds, No wheezing.   CardiovascularNormal S1,S2.  No m/r/g.   Abdomen: Benign, Soft, non-tender. Renal:  No costovertebral tenderness  GU:  Not performed at this time. Endoc: No evident thyromegaly, no signs of acromegaly. Skin:   warm, no rash. Extremities: normal, no cyanosis, clubbing.   LABORATORY PANEL:   CBC No results for input(s): WBC, HGB, HCT, PLT in the last 168 hours. ------------------------------------------------------------------------------------------------------------------  Chemistries  No results for input(s): NA, K, CL, CO2, GLUCOSE, BUN, CREATININE, CALCIUM, MG, AST, ALT, ALKPHOS, BILITOT in the last 168 hours.  Invalid input(s): GFRCGP ------------------------------------------------------------------------------------------------------------------  Cardiac Enzymes No results for input(s): TROPONINI in the last 168 hours. ------------------------------------------------------------  RADIOLOGY:   No results found for this or any previous visit.  Results for orders placed during the hospital encounter of 03/30/16  DG Chest 2 View   Narrative CLINICAL DATA:  75 year old male with severe shortness of Breath. Possible pneumonia. Former smoker. Initial encounter.  EXAM: CHEST  2 VIEW  COMPARISON:  03/30/2016 and earlier.  FINDINGS: Seated AP and lateral views of the chest. Dense left lower lobe opacification, suspicious for combination of consolidation and left pleural effusion. Blunting of the right costophrenic angle is largely chronic. There is mild increased streaky opacity at the right lung base. No pneumothorax. No definite pulmonary edema. However, the cardiac silhouette does appear larger since March. Other mediastinal contours are stable. Visualized tracheal air column is within normal limits. No acute osseous abnormality identified.  IMPRESSION: 1. Confluent abnormal left lower lobe  opacity, nonspecific but favor pneumonia with left pleural effusion. Followup PA and lateral chest X-ray is recommended in 3-4 weeks following trial of antibiotic therapy to ensure resolution and exclude underlying malignancy. 2. Cardiac silhouette appears increased since March. A Pericardial effusion is difficult to exclude. 3. Mildly increased streaky opacity at the right lung base, favor atelectasis.   Electronically Signed   By: Genevie Ann M.D.   On: 03/31/2016 08:52    ------------------------------------------------------------------------------------------------------------------  Thank  you for allowing [facility] to assist in the care of your patient. Our recommendations are noted above.  Please contact us if we can be of further service.   Marda Stalker, MD.  [facility] Office Number: [phone]  Patricia Pesa, M.D.  Vilinda Boehringer, M.D.  Merton Border, M.D  06/07/2016

## 2016-06-10 ENCOUNTER — Ambulatory Visit (INDEPENDENT_AMBULATORY_CARE_PROVIDER_SITE_OTHER): Payer: Medicare Other | Admitting: Internal Medicine

## 2016-06-10 ENCOUNTER — Encounter: Payer: Self-pay | Admitting: Internal Medicine

## 2016-06-10 VITALS — BP 110/58 | HR 97

## 2016-06-10 DIAGNOSIS — J9621 Acute and chronic respiratory failure with hypoxia: Secondary | ICD-10-CM

## 2016-06-14 ENCOUNTER — Telehealth: Payer: Self-pay | Admitting: *Deleted

## 2016-06-14 ENCOUNTER — Encounter
Admission: RE | Admit: 2016-06-14 | Discharge: 2016-06-14 | Disposition: A | Discharge status: Home / Self Care | Payer: Medicare Other | Source: Ambulatory Visit | Attending: Hematology and Oncology | Admitting: Hematology and Oncology

## 2016-06-14 ENCOUNTER — Encounter: Admission: RE | Admit: 2016-06-14 | Payer: Medicare Other | Source: Ambulatory Visit

## 2016-06-14 DIAGNOSIS — C61 Malignant neoplasm of prostate: Secondary | ICD-10-CM | POA: Insufficient documentation

## 2016-06-14 MED ORDER — TECHNETIUM TC 99M MEDRONATE IV KIT
21.5900 | PACK | Freq: Once | INTRAVENOUS | Status: AC | PRN
  Administered 2016-06-14: 21.59 via INTRAVENOUS

## 2016-06-14 NOTE — Telephone Encounter (Signed)
Called [facility] and spoke to [facility] and told her that we got his scan back and we need to see him and gave her appt 9/25 11:45.  She will try to make it but it is late notice for Monday transportation but she will try.

## 2016-06-14 NOTE — Telephone Encounter (Signed)
-----   Message from Lequita Asal, MD sent at 06/14/2016  3:00 PM EDT ----- Regarding: Bone scan   Will need to see him back secondary to bone scan results.  M  ----- Message ----- From: Interface, Rad Results In Sent: 06/14/2016   2:45 PM To: Lequita Asal, MD

## 2016-06-17 ENCOUNTER — Ambulatory Visit
Admission: RE | Admit: 2016-06-17 | Discharge: 2016-06-17 | Disposition: A | Discharge status: Home / Self Care | Payer: Medicare Other | Source: Ambulatory Visit | Attending: Hematology and Oncology | Admitting: Hematology and Oncology

## 2016-06-17 ENCOUNTER — Inpatient Hospital Stay (HOSPITAL_BASED_OUTPATIENT_CLINIC_OR_DEPARTMENT_OTHER): Payer: Medicare Other | Admitting: Hematology and Oncology

## 2016-06-17 VITALS — BP 106/58 | HR 110 | Temp 97.8°F | Wt 181.0 lb

## 2016-06-17 DIAGNOSIS — Z880 Allergy status to penicillin: Secondary | ICD-10-CM

## 2016-06-17 DIAGNOSIS — N3281 Overactive bladder: Secondary | ICD-10-CM

## 2016-06-17 DIAGNOSIS — Z79899 Other long term (current) drug therapy: Secondary | ICD-10-CM

## 2016-06-17 DIAGNOSIS — Z79818 Long term (current) use of other agents affecting estrogen receptors and estrogen levels: Secondary | ICD-10-CM | POA: Diagnosis not present

## 2016-06-17 DIAGNOSIS — I739 Peripheral vascular disease, unspecified: Secondary | ICD-10-CM | POA: Diagnosis not present

## 2016-06-17 DIAGNOSIS — Z87891 Personal history of nicotine dependence: Secondary | ICD-10-CM

## 2016-06-17 DIAGNOSIS — R936 Abnormal findings on diagnostic imaging of limbs: Secondary | ICD-10-CM | POA: Diagnosis not present

## 2016-06-17 DIAGNOSIS — Z95828 Presence of other vascular implants and grafts: Secondary | ICD-10-CM | POA: Diagnosis not present

## 2016-06-17 DIAGNOSIS — C7951 Secondary malignant neoplasm of bone: Secondary | ICD-10-CM

## 2016-06-17 DIAGNOSIS — Z8551 Personal history of malignant neoplasm of bladder: Secondary | ICD-10-CM | POA: Diagnosis not present

## 2016-06-17 DIAGNOSIS — M899 Disorder of bone, unspecified: Secondary | ICD-10-CM

## 2016-06-17 DIAGNOSIS — C61 Malignant neoplasm of prostate: Secondary | ICD-10-CM | POA: Diagnosis not present

## 2016-06-17 DIAGNOSIS — F0390 Unspecified dementia without behavioral disturbance: Secondary | ICD-10-CM

## 2016-06-17 DIAGNOSIS — N1330 Unspecified hydronephrosis: Secondary | ICD-10-CM

## 2016-06-17 DIAGNOSIS — I5042 Chronic combined systolic (congestive) and diastolic (congestive) heart failure: Secondary | ICD-10-CM

## 2016-06-17 DIAGNOSIS — K219 Gastro-esophageal reflux disease without esophagitis: Secondary | ICD-10-CM

## 2016-06-17 DIAGNOSIS — D631 Anemia in chronic kidney disease: Secondary | ICD-10-CM

## 2016-06-17 DIAGNOSIS — Z992 Dependence on renal dialysis: Secondary | ICD-10-CM

## 2016-06-17 DIAGNOSIS — I724 Aneurysm of artery of lower extremity: Secondary | ICD-10-CM

## 2016-06-17 DIAGNOSIS — Z8673 Personal history of transient ischemic attack (TIA), and cerebral infarction without residual deficits: Secondary | ICD-10-CM

## 2016-06-17 DIAGNOSIS — J449 Chronic obstructive pulmonary disease, unspecified: Secondary | ICD-10-CM

## 2016-06-17 DIAGNOSIS — I132 Hypertensive heart and chronic kidney disease with heart failure and with stage 5 chronic kidney disease, or end stage renal disease: Secondary | ICD-10-CM

## 2016-06-17 DIAGNOSIS — R948 Abnormal results of function studies of other organs and systems: Secondary | ICD-10-CM

## 2016-06-17 DIAGNOSIS — I480 Paroxysmal atrial fibrillation: Secondary | ICD-10-CM

## 2016-06-17 NOTE — Progress Notes (Signed)
Patient is here for follow up, he is doing ok. He had his two series flu shot at dialysis. Placed in chart the flu vaccine information

## 2016-06-17 NOTE — Progress Notes (Signed)
[facility] day:  06/17/2016  Chief Complaint: [name] is a 75 y.o. male with end stage renal disease on dialysis, prostate and bladder cancer, who is seen for review of interval studies.  HPI: The patient was last seen by me in the medical oncology on 05/31/2016.  At that time, he was struggling with COPD.  He had chronic chronic shortness of breath.  Hematocrit had improved from 24.1 to 28.1.  He received his Lupron.  Bone scan was ordered.  Bone scan on 06/14/2016 revealed no obvious bony metastatic disease.  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 This finding may warrant radiographic correlation.  In addition, there appears to be an expansile lesion of the distal right femur and knee with photopenia in this area.  Etiology for this appearance is uncertain.  Aggressive neoplasia in this area could cause findings of this nature.  This area also could represent residua of prior trauma. Radiographic correlation advised.  Absent renal uptake is felt to be due to the known end-stage renal disease.  Labs on 05/31/2016 revealed a hematocrit of 28.1, hemoglobin 8.8, MCV 86.4, platelets 196,000, WBC 5300 with an ANC of 3200.  Differential was unremarkable.  BUN was 52 with a creatinine 4.10.  Alkaline phosphatase was 210.  SPEP revealed no monoclonal protein.  Kappa free light chains were 89.9, lambda free light chains 102.9, and ratio 0.87 (normal).  PSA was 2.44.  Ferritin was 668.  Iron saturation was 9% and TIBC 225.  The patient states that he has been doing about the same.  He notes stiffness and weakness from his hip down to his knee on the right.  He denies any pain.   Past Medical History:  Diagnosis Date  . Allergic rhinitis    . Anemia    a. baseline hgb ~ 8  . Bladder cancer ([place])   . Chronic combined systolic and diastolic CHF (congestive heart failure) ([place])    a. echo 12/2014: EF 25-30%, anterior wall wall motion abnormalities; b. planned ischemic evaluation once patient is stable medically; c. echo 01/2015: EF 45-50%, no RWMA, mild AT, LA mildly dilated, mod pericardial effusion along LV free wall, no evidence of hemodynamic compromise  . Cognitive communication deficit   . COPD (chronic obstructive pulmonary disease) ([place])   . Dementia   . Detrusor sphincter dyssynergia   . ESRD on hemodialysis ([place])    a. Tuesday, Thursday, and Saturdays  . GERD (gastroesophageal reflux disease)   . History of septic shock    a. 01/2015  . History of small bowel obstruction    a. 01/2015  . Hydronephrosis   . Hypercholesteremia   . Hypertension   . 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 Incontinence   . Magnesium deficiency   . Mini stroke ([place])   . Overactive bladder   . PAF (paroxysmal atrial fibrillation) ([place])    a. new onset 12/2014 in the setting of UTI, sepsis, hypotension, and anemia; b. not on long term anticoagulation given anemia; c. family aware of stroke risk, they are ok with this; d. on amiodarone   . Prostate cancer [facility])     Past Surgical History:  Procedure Laterality Date  . AV FISTULA PLACEMENT     left arm  .  CYSTOSCOPY W/ URETERAL STENT PLACEMENT    . PERIPHERAL VASCULAR CATHETERIZATION N/A 01/24/2016   Procedure: Endovascular Repair/Stent Graft;  Surgeon: Katha Cabal, MD;  Location: [facility];  Service: Cardiovascular;  Laterality: N/A;  . PROSTATE SURGERY     Removal  . TRANSURETHRAL RESECTION OF BLADDER TUMOR WITH GYRUS (TURBT-GYRUS)    . URETERAL STENT PLACEMENT      Family History  Problem Relation Age of Onset  . Stroke Mother     Social History:  reports that he quit smoking about 13 months ago. He has a 14.00 pack-year smoking history. He has never used smokeless tobacco. He  reports that he does not drink alcohol or use drugs.  Contact number is [phone].  The patient is accompanied by his cousin and medical power of attorney, [name].  Contact number is [phone].  Allergies:  Allergies  Allergen Reactions  . Penicillins Hives, Itching and Swelling    Has patient had a PCN reaction causing immediate rash, facial/tongue/throat swelling, SOB or lightheadedness with hypotension: Yes Has patient had a PCN reaction causing severe rash involving mucus membranes or skin necrosis: No Has patient had a PCN reaction that required hospitalization No Has patient had a PCN reaction occurring within the last 10 years: No If all of the above answers are "NO", then may proceed with Cephalosporin use.    Current Medications: Current Outpatient Prescriptions  Medication Sig Dispense Refill  . acetaminophen (TYLENOL) 325 MG tablet Take 650 mg by mouth every 8 (eight) hours as needed. For general discomfort    . albuterol (PROVENTIL) (2.5 MG/3ML) 0.083% nebulizer solution Take 3 mLs (2.5 mg total) by nebulization every 4 (four) hours as needed for wheezing or shortness of breath. 75 mL 12  . amiodarone (PACERONE) 200 MG tablet Take 1 tablet (200 mg total) by mouth daily. 30 tablet 5  . atorvastatin (LIPITOR) 40 MG tablet Take 40 mg by mouth at bedtime.    . carvedilol (COREG) 3.125 MG tablet Take 1 tablet (3.125 mg total) by mouth 2 (two) times daily with a meal. 60 tablet 2  . ferrous sulfate 325 (65 FE) MG tablet Take 325 mg by mouth daily.    Marland Kitchen guaiFENesin (MUCINEX) 600 MG 12 hr tablet Take 600 mg by mouth 2 (two) times daily. (Take every morning and at bedtime for cough)    . guaifenesin (ROBITUSSIN) 100 MG/5ML syrup Take 200 mg by mouth every 4 (four) hours as needed for cough.    Marland Kitchen ipratropium-albuterol (DUONEB) 0.5-2.5 (3) MG/3ML SOLN Take 3 mLs by nebulization 3 (three) times daily.    Marland Kitchen lamoTRIgine (LAMICTAL) 25 MG tablet Take 50 mg by mouth at bedtime. Start  on July 14th.    . latanoprost (XALATAN) 0.005 % ophthalmic solution Place 1 drop into both eyes at bedtime.    Marland Kitchen LORazepam (ATIVAN) 0.5 MG tablet Take 1 tablet (0.5 mg total) by mouth every 4 (four) hours as needed for anxiety. 30 tablet 0  . magnesium oxide (MAG-OX) 400 MG tablet Take 1 tablet (400 mg total) by mouth every evening. 30 tablet 2  . ondansetron (ZOFRAN) 8 MG tablet Take by mouth every 8 (eight) hours as needed for nausea.    Marland Kitchen oxyCODONE (OXY IR/ROXICODONE) 5 MG immediate release tablet Take 1 tablet (5 mg total) by mouth every 6 (six) hours as needed for moderate pain or severe pain. 20 tablet 0  . OXYGEN Inhale 2-3 L into the lungs as needed. For  shortness of breath, respiratory distress or O2 sats <90.    Marland Kitchen POLYETHYLENE GLYCOL 3350 PO Take 17 g by mouth daily as needed. For constipation. *Mix 1 capful in 6-8 ounces of beverage*    . sevelamer carbonate (RENVELA) 800 MG tablet Take 800 mg by mouth 3 (three) times daily before meals.     . tiotropium (SPIRIVA) 18 MCG inhalation capsule Place 1 capsule (18 mcg total) into inhaler and inhale daily. 30 capsule 12   No current facility-administered medications for this visit.     Review of Systems:  GENERAL:  Feels "the same".  No fevers, sweats or weight loss. PERFORMANCE STATUS (ECOG):  3 HEENT:  No visual changes, runny nose, sore throat, mouth sores or tenderness. Lungs: COPD on oxygen (2 liter/min). Shortness of breath at times.  No cough.  No hemoptysis. Cardiac:  No chest pain, palpitations, orthopnea, or PND. GI:  No abdominal pain.  No nausea, vomiting, melena or hematochezia. GU:  No urgency, frequency, dysuria, or hematuria. Musculoskeletal:  Stiffness and weakness in hip down to knee on right side (old).  No back pain.  No joint pain.  No muscle tenderness. Extremities:  No pain or swelling. Skin:  No rashes or skin changes. Neuro:  No headache, numbness or weakness, balance or coordination issues. Endocrine:  No  diabetes, thyroid issues, hot flashes or night sweats. Psych:  No mood changes, depression or anxiety. Pain:  No focal pain. Review of systems:  All other systems reviewed and found to be negative.  Physical Exam: Blood pressure (!) 106/58, pulse (!) 110, temperature 97.8 F (36.6 C), temperature source Tympanic, weight 181 lb (82.1 kg). GENERAL:  Well developed, well nourished, gentleman sitting comfortably in a high back wheelchair in the exam room in no acute distress. MENTAL STATUS:  Alert and oriented to person, place and time. HEAD:  Wearing a gray golf cap.  Gray hair with gray goatee.  Normocephalic, atraumatic, face symmetric, no Cushingoid features. EYES:  Dark rimmed glasses.  Brown eyes.  No conjunctivitis or scleral icterus. ENT:  Eaton in place.  Speaks in short sentences. NEUROLOGICAL: Unremarkable. PSYCH:  Appropriate.   No visits with results within 3 Day(s) from this visit.  Latest known visit with results is:  Orders Only on 05/31/2016  Component Date Value Ref Range Status  . WBC 05/31/2016 5.3  3.8 - 10.6 K/uL Final  . RBC 05/31/2016 3.25* 4.40 - 5.90 MIL/uL Final  . Hemoglobin 05/31/2016 8.8* 13.0 - 18.0 g/dL Final  . HCT 05/31/2016 28.1* 40.0 - 52.0 % Final  . MCV 05/31/2016 86.4  80.0 - 100.0 fL Final  . MCH 05/31/2016 27.1  26.0 - 34.0 pg Final  . MCHC 05/31/2016 31.4* 32.0 - 36.0 g/dL Final  . RDW 05/31/2016 20.7* 11.5 - 14.5 % Final  . Platelets 05/31/2016 196  150 - 440 K/uL Final  . Neutrophils Relative % 05/31/2016 60  % Final  . Neutro Abs 05/31/2016 3.2  1.4 - 6.5 K/uL Final  . Lymphocytes Relative 05/31/2016 14  % Final  . Lymphs Abs 05/31/2016 0.7* 1.0 - 3.6 K/uL Final  . Monocytes Relative 05/31/2016 19  % Final  . Monocytes Absolute 05/31/2016 1.0  0.2 - 1.0 K/uL Final  . Eosinophils Relative 05/31/2016 6  % Final  . Eosinophils Absolute 05/31/2016 0.3  0 - 0.7 K/uL Final  . Basophils Relative 05/31/2016 1  % Final  . Basophils Absolute  05/31/2016 0.0  0 - 0.1 K/uL Final  .  Sodium 05/31/2016 139  135 - 145 mmol/L Final  . Potassium 05/31/2016 3.5  3.5 - 5.1 mmol/L Final  . Chloride 05/31/2016 96* 101 - 111 mmol/L Final  . CO2 05/31/2016 33* 22 - 32 mmol/L Final  . Glucose, Bld 05/31/2016 104* 65 - 99 mg/dL Final  . BUN 05/31/2016 52* 6 - 20 mg/dL Final  . Creatinine, Ser 05/31/2016 4.10* 0.61 - 1.24 mg/dL Final  . Calcium 05/31/2016 8.5* 8.9 - 10.3 mg/dL Final  . Total Protein 05/31/2016 5.7* 6.5 - 8.1 g/dL Final  . Albumin 05/31/2016 2.6* 3.5 - 5.0 g/dL Final  . AST 05/31/2016 14* 15 - 41 U/L Final  . ALT 05/31/2016 6* 17 - 63 U/L Final  . Alkaline Phosphatase 05/31/2016 210* 38 - 126 U/L Final  . Total Bilirubin 05/31/2016 0.5  0.3 - 1.2 mg/dL Final  . GFR calc non Af Amer 05/31/2016 13* >60 mL/min Final  . GFR calc Af Amer 05/31/2016 15* >60 mL/min Final   Comment: (NOTE) The eGFR has been calculated using the CKD EPI equation. This calculation has not been validated in all clinical situations. eGFR's persistently <60 mL/min signify possible Chronic Kidney Disease.   . Anion gap 05/31/2016 10  5 - 15 Final  . Total Protein ELP 06/03/2016 5.2* 6.0 - 8.5 g/dL Final  . Albumin ELP 06/03/2016 2.9  2.9 - 4.4 g/dL Final  . Alpha-1-Globulin 06/03/2016 0.4  0.0 - 0.4 g/dL Final  . Alpha-2-Globulin 06/03/2016 0.6  0.4 - 1.0 g/dL Final  . Beta Globulin 06/03/2016 0.8  0.7 - 1.3 g/dL Final  . Gamma Globulin 06/03/2016 0.5  0.4 - 1.8 g/dL Final  . M-Spike, % 06/03/2016 Not Observed  Not Observed g/dL Final  . SPE Interp. 06/03/2016 Comment   Final   Comment: (NOTE) The SPE pattern appears essentially unremarkable. Evidence of monoclonal protein is not apparent. Performed At: [facility] [place], [place] [id number] Lindon Romp MD ES:[id number]   . Comment 06/03/2016 Comment   Final   Comment: (NOTE) Protein electrophoresis scan will follow via computer, mail, or courier delivery.   Marland Kitchen  GLOBULIN, TOTAL 06/03/2016 2.3  2.2 - 3.9 g/dL Corrected  . A/G Ratio 06/03/2016 1.3  0.7 - 1.7 Corrected  . Kappa free light chain 06/03/2016 89.9* 3.3 - 19.4 mg/L Final  . Lamda free light chains 06/03/2016 102.9* 5.7 - 26.3 mg/L Final  . Kappa, lamda light chain ratio 06/03/2016 0.87  0.26 - 1.65 Final   Comment: (NOTE) Performed At: [facility] [place], [place] [id number] Lindon Romp MD [id number]   . Ferritin 05/31/2016 668* 24 - 336 ng/mL Final  . Iron 05/31/2016 19* 45 - 182 ug/dL Final  . TIBC 05/31/2016 225* 250 - 450 ug/dL Final  . Saturation Ratios 05/31/2016 9* 17.9 - 39.5 % Final  . UIBC 05/31/2016 206  ug/dL Final  . PSA 05/31/2016 2.44  0.00 - 4.00 ng/mL Final   Comment: (NOTE) While PSA levels of <=4.0 ng/ml are reported as reference range, some men with levels below 4.0 ng/ml can have prostate cancer and many men with PSA above 4.0 ng/ml do not have prostate cancer.  Other tests such as free PSA, age specific reference ranges, PSA velocity and PSA doubling time may be helpful especially in men less than 37 years old. Performed at [facility]   Outside labs from the dialysis center on 06/20/2015 revealed a hematocrit of 23.4 and hemoglobin of 7.8  Assessment:  [name] is a 75 y.o. male with end stage renal disease on dialysis since 06/2014.  The etiology of his renal disease is unclear.  He has stage IV prostate cancer.  PET scan on 06/2011 had revealed lymphadenopathy with sclerotic bone metastasis consistent with probable prostate cancer. There was bilateral hydronephrosis with bladder base thickening from probable prostate ingrowth. Bone scan 07/30/2011 revealed multiple areas consistent with metastatic disease.  He receives Lupron every 6 months.  PSA was 12 on 07/23/2011, 2.15 on 01/30/2015, and 1.44 on 06/08/2015, 2.00 on 11/08/2015, and 2.44 on 05/31/2016.  He has a history of bladder cancer and is status post  transurethral resection of bladder tumor (TURBT). He has stents in place.  He denies any hematuria.  Bone scan on 06/14/2016 revealed no obvious bony metastatic disease.  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  In addition, there appears to be an expansile lesion of the distal right femur and knee with photopenia in this area.  Right femur films on 06/17/2016 revealed no acute bone abnormality. 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   He had a colonoscopy in 2012 which was negative. He is not felt to be a good candidate for sedation (further endoscopies) secondary to his multiple morbidities.  Labs on 06/07/2014 revealed a ferritin of 6660 (prior value of 838 on 05/04/2015). Sedimentation rate was 80. Folate was 20.  Reticulocyte count was 3.4%, TSH was 1.07, ANA was negative.  Haptoglobin was 225 (normal), LDH 203 (98-192).  B12 was normal on 05/04/2015.  He has multifactorial anemia. He has anemia of chronic renal disease.  By his history, he is receiving Procrit.  He has not received PRBCs since 05/2015.  He may have some component of GI blood loss.  Diet is good. He denies any melena or hematochezia.    Symptomatically, he struggles with COPD.  He has chronic chronic shortness of breath.  Hematocrit has improved from 24.1 to 28.1.  Plan: 1.  Review labs from last visit.  No evidence of progressive cancer. 2.  Review bone scan pictures with patient and his cousin.  Discuss unclear significance of photopenic area around distal right knee.  Discuss plain films today. 3.  Plain films 2 views distal right femur. 4.  RTC as previously scheduled.  Addendum:  Right femur films reveal no acute bone abnormality. 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 Vascular surgery consultation is suggested.  Dr. Delana Meyer of vascular surgery was contacted.  He will  be seen in the vascular clinc this week or next.  The patient's medical power of attorney, [name], was contacted.    Lequita Asal, MD  06/17/2016, 11:49 AM

## 2016-06-18 ENCOUNTER — Encounter: Payer: Self-pay | Admitting: Hematology and Oncology

## 2016-06-18 ENCOUNTER — Telehealth: Payer: Self-pay | Admitting: *Deleted

## 2016-06-18 NOTE — Telephone Encounter (Signed)
Dr.  Mike Gip spoke to [facility] yest. About this pt and she wanted me to enter ref. To see [facility].  Debra from their office called today and they are not on EPIC and wil lbe next tues. All notes and info needs to be sent for an appt. All records faxed to their office [phone].

## 2016-06-20 ENCOUNTER — Other Ambulatory Visit (INDEPENDENT_AMBULATORY_CARE_PROVIDER_SITE_OTHER): Payer: Self-pay | Admitting: Vascular Surgery

## 2016-06-20 ENCOUNTER — Other Ambulatory Visit: Payer: Self-pay | Admitting: Emergency Medicine

## 2016-06-20 DIAGNOSIS — I714 Abdominal aortic aneurysm, without rupture, unspecified: Secondary | ICD-10-CM

## 2016-06-20 DIAGNOSIS — I724 Aneurysm of artery of lower extremity: Secondary | ICD-10-CM

## 2016-06-27 ENCOUNTER — Ambulatory Visit
Admission: RE | Admit: 2016-06-27 | Discharge: 2016-06-27 | Disposition: A | Discharge status: Home / Self Care | Payer: Medicare Other | Source: Ambulatory Visit | Attending: Vascular Surgery | Admitting: Vascular Surgery

## 2016-06-27 DIAGNOSIS — J9811 Atelectasis: Secondary | ICD-10-CM | POA: Insufficient documentation

## 2016-06-27 DIAGNOSIS — I724 Aneurysm of artery of lower extremity: Secondary | ICD-10-CM | POA: Diagnosis not present

## 2016-06-27 DIAGNOSIS — N261 Atrophy of kidney (terminal): Secondary | ICD-10-CM | POA: Insufficient documentation

## 2016-06-27 DIAGNOSIS — I714 Abdominal aortic aneurysm, without rupture, unspecified: Secondary | ICD-10-CM

## 2016-06-27 DIAGNOSIS — N1330 Unspecified hydronephrosis: Secondary | ICD-10-CM | POA: Diagnosis not present

## 2016-06-27 DIAGNOSIS — M5136 Other intervertebral disc degeneration, lumbar region: Secondary | ICD-10-CM | POA: Insufficient documentation

## 2016-06-27 MED ORDER — IOPAMIDOL (ISOVUE-370) INJECTION 76%
125.0000 mL | Freq: Once | INTRAVENOUS | Status: AC | PRN
  Administered 2016-06-27: 125 mL via INTRAVENOUS

## 2016-07-01 ENCOUNTER — Ambulatory Visit: Payer: Medicare Other | Admitting: Family

## 2016-07-04 DIAGNOSIS — Z992 Dependence on renal dialysis: Secondary | ICD-10-CM | POA: Diagnosis not present

## 2016-07-04 DIAGNOSIS — N186 End stage renal disease: Secondary | ICD-10-CM | POA: Diagnosis not present

## 2016-07-04 DIAGNOSIS — Z515 Encounter for palliative care: Secondary | ICD-10-CM | POA: Diagnosis not present

## 2016-07-05 ENCOUNTER — Encounter: Payer: Self-pay | Admitting: Family

## 2016-07-05 ENCOUNTER — Ambulatory Visit: Payer: Medicare Other | Attending: Family | Admitting: Family

## 2016-07-05 VITALS — BP 102/60 | HR 101 | Resp 18 | Ht 74.0 in

## 2016-07-05 DIAGNOSIS — I4819 Other persistent atrial fibrillation: Secondary | ICD-10-CM

## 2016-07-05 DIAGNOSIS — Z8673 Personal history of transient ischemic attack (TIA), and cerebral infarction without residual deficits: Secondary | ICD-10-CM | POA: Insufficient documentation

## 2016-07-05 DIAGNOSIS — Z87891 Personal history of nicotine dependence: Secondary | ICD-10-CM | POA: Diagnosis not present

## 2016-07-05 DIAGNOSIS — Z79899 Other long term (current) drug therapy: Secondary | ICD-10-CM | POA: Diagnosis not present

## 2016-07-05 DIAGNOSIS — Z9981 Dependence on supplemental oxygen: Secondary | ICD-10-CM | POA: Diagnosis not present

## 2016-07-05 DIAGNOSIS — Z7951 Long term (current) use of inhaled steroids: Secondary | ICD-10-CM | POA: Insufficient documentation

## 2016-07-05 DIAGNOSIS — Z8551 Personal history of malignant neoplasm of bladder: Secondary | ICD-10-CM | POA: Diagnosis not present

## 2016-07-05 DIAGNOSIS — I480 Paroxysmal atrial fibrillation: Secondary | ICD-10-CM | POA: Diagnosis not present

## 2016-07-05 DIAGNOSIS — I5032 Chronic diastolic (congestive) heart failure: Secondary | ICD-10-CM

## 2016-07-05 DIAGNOSIS — K219 Gastro-esophageal reflux disease without esophagitis: Secondary | ICD-10-CM | POA: Insufficient documentation

## 2016-07-05 DIAGNOSIS — I509 Heart failure, unspecified: Secondary | ICD-10-CM | POA: Diagnosis present

## 2016-07-05 DIAGNOSIS — Z992 Dependence on renal dialysis: Secondary | ICD-10-CM | POA: Diagnosis not present

## 2016-07-05 DIAGNOSIS — I10 Essential (primary) hypertension: Secondary | ICD-10-CM

## 2016-07-05 DIAGNOSIS — Z823 Family history of stroke: Secondary | ICD-10-CM | POA: Insufficient documentation

## 2016-07-05 DIAGNOSIS — Z8546 Personal history of malignant neoplasm of prostate: Secondary | ICD-10-CM | POA: Diagnosis not present

## 2016-07-05 DIAGNOSIS — N186 End stage renal disease: Secondary | ICD-10-CM | POA: Diagnosis not present

## 2016-07-05 DIAGNOSIS — I132 Hypertensive heart and chronic kidney disease with heart failure and with stage 5 chronic kidney disease, or end stage renal disease: Secondary | ICD-10-CM | POA: Diagnosis not present

## 2016-07-05 DIAGNOSIS — E7800 Pure hypercholesterolemia, unspecified: Secondary | ICD-10-CM | POA: Insufficient documentation

## 2016-07-05 DIAGNOSIS — F0390 Unspecified dementia without behavioral disturbance: Secondary | ICD-10-CM | POA: Diagnosis not present

## 2016-07-05 DIAGNOSIS — Z880 Allergy status to penicillin: Secondary | ICD-10-CM | POA: Insufficient documentation

## 2016-07-05 DIAGNOSIS — J449 Chronic obstructive pulmonary disease, unspecified: Secondary | ICD-10-CM | POA: Diagnosis not present

## 2016-07-05 NOTE — Patient Instructions (Signed)
Continue weighing daily and call for an overnight weight gain of > 2 pounds or a weekly weight gain of >5 pounds. 

## 2016-07-05 NOTE — Progress Notes (Signed)
Subjective:    Patient ID: [name], male    DOB: 1941/07/15, 75 y.o.   MRN: [id number]  HPI  Mr Vivo is a 75 y/o male with a history of prostate cancer, atrial fibrillation, TIA, HTN, hyperlipidemia, GERD, ESRD with dialysis on T, Th, Sat, COPD, bladder cancer, anemia and chronic heart failure.  Last echo was done 03/31/16 which showed an EF of 35-40% with mild AR and mild MR. Increased PA pressure of 66 mmHg. EF June 2016 was 60-65%.  Admitted to [facility] on 05/20/16 with COPD exacerbation. Was discharged on prednisone taper, antibiotics along with nebulizer therapy. Was discharged to [facility]  He presents today for a follow-up visit with shortness of breath with exertion. Denies any fatigue although endorses difficulty sleeping due to positional issues. Falls asleep quickly in the office today. Reports getting weighed at facility but doesn't recall what his last weight was. Unable to weigh today due to his inability to support himself. Continues with dialysis three times a week.   Past Medical History:  Diagnosis Date  . Allergic rhinitis   . Anemia    a. baseline hgb ~ 8  . Bladder cancer ([place])   . Chronic combined systolic and diastolic CHF (congestive heart failure) ([place])    a. echo 12/2014: EF 25-30%, anterior wall wall motion abnormalities; b. planned ischemic evaluation once patient is stable medically; c. echo 01/2015: EF 45-50%, no RWMA, mild AT, LA mildly dilated, mod pericardial effusion along LV free wall, no evidence of hemodynamic compromise  . Cognitive communication deficit   . COPD (chronic obstructive pulmonary disease) ([place])   . Dementia   . Detrusor sphincter dyssynergia   . ESRD on hemodialysis ([place])    a. Tuesday, Thursday, and Saturdays  . GERD (gastroesophageal reflux disease)   . History of septic shock    a. 01/2015  . History of small bowel obstruction    a. 01/2015  . Hydronephrosis   . Hypercholesteremia   . Hypertension   . 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 Incontinence   . Magnesium deficiency   . Mini stroke ([place])   . Overactive bladder   . PAF (paroxysmal atrial fibrillation) ([place])    a. new onset 12/2014 in the setting of UTI, sepsis, hypotension, and anemia; b. not on long term anticoagulation given anemia; c. family aware of stroke risk, they are ok with this; d. on amiodarone   . Prostate cancer [facility])     Past Surgical History:  Procedure Laterality Date  . AV FISTULA PLACEMENT     left arm  . CYSTOSCOPY W/ URETERAL STENT PLACEMENT    . PERIPHERAL VASCULAR CATHETERIZATION N/A 01/24/2016   Procedure: Endovascular Repair/Stent Graft;  Surgeon: Katha Cabal, MD;  Location: [facility];  Service: Cardiovascular;  Laterality: N/A;  . PROSTATE SURGERY     Removal  . TRANSURETHRAL RESECTION OF BLADDER TUMOR WITH GYRUS (TURBT-GYRUS)    . URETERAL STENT PLACEMENT      Family History  Problem Relation Age of Onset  . Stroke Mother     Social History  Substance Use Topics  . Smoking status: Former Smoker    Packs/day: 0.25    Years: 56.00    Quit date: 05/13/2015  . Smokeless tobacco: Never Used  . Alcohol use No    Allergies  Allergen Reactions  . Penicillins Hives, Itching and Swelling    Has patient had a PCN reaction causing immediate rash, facial/tongue/throat swelling, SOB or  lightheadedness with hypotension: Yes Has patient had a PCN reaction causing severe rash involving mucus membranes or skin necrosis: No Has patient had a PCN reaction that required hospitalization No Has patient had a PCN reaction occurring within the last 10 years: No If all of the above answers are "NO", then may proceed with Cephalosporin use.    Prior to Admission medications   Medication Sig Start Date End Date Taking? Authorizing Provider  acetaminophen (TYLENOL) 325 MG tablet Take 650 mg by mouth every 8 (eight) hours as needed. For general discomfort   Yes Historical Provider, MD  albuterol (PROVENTIL) (2.5 MG/3ML)  0.083% nebulizer solution Take 3 mLs (2.5 mg total) by nebulization every 4 (four) hours as needed for wheezing or shortness of breath. 06/10/15  Yes Fritzi Mandes, MD  amiodarone (PACERONE) 200 MG tablet Take 1 tablet (200 mg total) by mouth daily. 09/13/15  Yes Gladstone Lighter, MD  atorvastatin (LIPITOR) 40 MG tablet Take 40 mg by mouth at bedtime.   Yes Historical Provider, MD  budesonide-formoterol (SYMBICORT) 160-4.5 MCG/ACT inhaler Inhale 2 puffs into the lungs 2 (two) times daily.   Yes Historical Provider, MD  carvedilol (COREG) 3.125 MG tablet Take 1 tablet (3.125 mg total) by mouth 2 (two) times daily with a meal. 03/14/16  Yes Gladstone Lighter, MD  ferrous sulfate 325 (65 FE) MG tablet Take 325 mg by mouth daily.   Yes Historical Provider, MD  folic acid (FOLVITE) A999333 MCG tablet Take 400 mcg by mouth daily.   Yes Historical Provider, MD  guaiFENesin (MUCINEX) 600 MG 12 hr tablet Take 600 mg by mouth 2 (two) times daily. (Take every morning and at bedtime for cough)   Yes Historical Provider, MD  guaifenesin (ROBITUSSIN) 100 MG/5ML syrup Take 200 mg by mouth every 4 (four) hours as needed for cough.   Yes Historical Provider, MD  ipratropium-albuterol (DUONEB) 0.5-2.5 (3) MG/3ML SOLN Take 3 mLs by nebulization 3 (three) times daily.   Yes Historical Provider, MD  lamoTRIgine (LAMICTAL) 25 MG tablet Take 50 mg by mouth at bedtime. Start on July 14th. 04/05/16  Yes Historical Provider, MD  latanoprost (XALATAN) 0.005 % ophthalmic solution Place 1 drop into both eyes at bedtime.   Yes Historical Provider, MD  LORazepam (ATIVAN) 0.5 MG tablet Take 1 tablet (0.5 mg total) by mouth every 4 (four) hours as needed for anxiety. Patient taking differently: Take 0.5 mg by mouth 2 (two) times daily as needed for anxiety.  05/22/16  Yes Dustin Flock, MD  magnesium oxide (MAG-OX) 400 MG tablet Take 1 tablet (400 mg total) by mouth every evening. 09/13/15  Yes Gladstone Lighter, MD  omeprazole (PRILOSEC) 20  MG capsule Take 20 mg by mouth daily.   Yes Historical Provider, MD  ondansetron (ZOFRAN) 8 MG tablet Take by mouth every 8 (eight) hours as needed for nausea.   Yes Historical Provider, MD  oxyCODONE (OXY IR/ROXICODONE) 5 MG immediate release tablet Take 1 tablet (5 mg total) by mouth every 6 (six) hours as needed for moderate pain or severe pain. 05/22/16  Yes Dustin Flock, MD  OXYGEN Inhale 2-3 L into the lungs as needed. For shortness of breath, respiratory distress or O2 sats <90.   Yes Historical Provider, MD  sevelamer carbonate (RENVELA) 800 MG tablet Take 800 mg by mouth 3 (three) times daily before meals.    Yes Historical Provider, MD  tiotropium (SPIRIVA) 18 MCG inhalation capsule Place 1 capsule (18 mcg total) into inhaler and inhale daily.  09/13/15  Yes Gladstone Lighter, MD     Review of Systems  Constitutional: Negative for appetite change and fatigue.  HENT: Negative for congestion, postnasal drip and sore throat.   Eyes: Negative.   Respiratory: Positive for cough and shortness of breath. Negative for chest tightness.   Cardiovascular: Negative for chest pain, palpitations and leg swelling.  Gastrointestinal: Negative for abdominal distention and abdominal pain.  Endocrine: Negative.   Genitourinary: Negative.   Musculoskeletal: Negative for back pain and neck pain.  Skin: Negative.   Allergic/Immunologic: Negative.   Neurological: Negative for dizziness and light-headedness.  Hematological: Negative for adenopathy. Does not bruise/bleed easily.  Psychiatric/Behavioral: Positive for sleep disturbance (due to position in bed). Negative for dysphoric mood. The patient is not nervous/anxious.    Vitals:   07/05/16 0949 07/05/16 0958  BP: (!) 79/50 102/60  Pulse: (!) 101   Resp: 18   Height: 6\' 2"  (1.88 m)        Objective:   Physical Exam  Constitutional: He is oriented to person, place, and time. He appears well-developed and well-nourished.  HENT:  Head:  Normocephalic and atraumatic.  Eyes: Conjunctivae are normal. Pupils are equal, round, and reactive to light.  Neck: Normal range of motion. Neck supple.  Cardiovascular: Regular rhythm.  Tachycardia present.   Pulmonary/Chest: Effort normal. He has no wheezes. He has no rales.  Abdominal: Soft. He exhibits no distension. There is no tenderness.  Musculoskeletal: He exhibits no edema or tenderness.  Neurological: He is alert and oriented to person, place, and time.  Skin: Skin is warm and dry.  Psychiatric: He has a normal mood and affect. His behavior is normal. Thought content normal.  Nursing note and vitals reviewed.         Assessment & Plan:  1: Chronic heart failure with preserved ejection fraction- - NYHA Class II - euvolemic - weigh daily and let provider know of an overnight weight gain of >2 pounds/weekly weight gain of >5 pounds - EF has dropped but limited in medications due to dialysis  2: HTN-  - BP on the low side but no dizziness - Continue to monitor  3: Atrial fibrillation- - Tachycardic today - Continues to take amiodarone and carvedilol  4: COPD- - Wearing oxygen at 2L around the clock - using nebulizer and inhalers  5: ESRD - dialysis on Tue, Thu, & Sat  Patient to return as needed or call for any questions/problems.

## 2016-07-28 LAB — FUNGUS CULTURE, BLOOD

## 2016-08-08 ENCOUNTER — Ambulatory Visit (INDEPENDENT_AMBULATORY_CARE_PROVIDER_SITE_OTHER): Payer: Self-pay | Admitting: Vascular Surgery

## 2016-08-12 ENCOUNTER — Ambulatory Visit (INDEPENDENT_AMBULATORY_CARE_PROVIDER_SITE_OTHER): Payer: Medicare Other | Admitting: Vascular Surgery

## 2016-08-19 ENCOUNTER — Encounter (INDEPENDENT_AMBULATORY_CARE_PROVIDER_SITE_OTHER): Payer: Self-pay | Admitting: Vascular Surgery

## 2016-08-19 ENCOUNTER — Ambulatory Visit (INDEPENDENT_AMBULATORY_CARE_PROVIDER_SITE_OTHER): Payer: Medicare Other | Admitting: Vascular Surgery

## 2016-08-19 ENCOUNTER — Encounter (INDEPENDENT_AMBULATORY_CARE_PROVIDER_SITE_OTHER): Payer: Self-pay

## 2016-08-19 VITALS — BP 107/75 | HR 102 | Resp 16 | Ht 70.0 in | Wt 174.0 lb

## 2016-08-19 DIAGNOSIS — I714 Abdominal aortic aneurysm, without rupture, unspecified: Secondary | ICD-10-CM

## 2016-08-19 DIAGNOSIS — I724 Aneurysm of artery of lower extremity: Secondary | ICD-10-CM

## 2016-08-19 DIAGNOSIS — Z992 Dependence on renal dialysis: Secondary | ICD-10-CM | POA: Diagnosis not present

## 2016-08-19 DIAGNOSIS — N186 End stage renal disease: Secondary | ICD-10-CM

## 2016-08-19 DIAGNOSIS — J449 Chronic obstructive pulmonary disease, unspecified: Secondary | ICD-10-CM | POA: Diagnosis not present

## 2016-08-19 DIAGNOSIS — I10 Essential (primary) hypertension: Secondary | ICD-10-CM | POA: Diagnosis not present

## 2016-08-19 NOTE — Progress Notes (Signed)
MRN : [id number]  [name] is a 75 y.o. (1941/07/22) male who presents with chief complaint of  Chief Complaint  Patient presents with  . Follow-up  .  History of Present Illness: The patient is seen for follow up evaluation of AAAAnd popliteal artery aneurysms status post CTA. There were no problems or complications related to the CT scan. The patient denies interval development of abdominal or back pain. No new lower extremity pain or discoloration of the toes. However, he continues to complain of pain in both lower extremities. His description is somewhat difficult to make sense of as he says it starts in the knees and radiates to the hips and sometimes to the lower back. He denies pain in the proximal. He does attest to some pain in the left foot and some painful sensations of the right toes. This pain is intermittent he rates it anywhere from a 6 to at 10 out of 10. Sometimes he says it is just an aching and sometimes he says it is more of a burning.   The patient has a history of coronary artery disease, no recent episodes of angina or shortness of breath. The patient denies interval anaurosis fugax. There is o recent history of TIA symptoms or focal motor deficits. The patient denies PAD or claudication symptoms. There is a history of hyperlipidemia which is being treated with a statin.   CT angiography of the abdomen and pelvis shows an infrarenal AAA with a stent graft in place there is no type I endoleak there is a small type II endoleak noted. The sac has decreased by several millimeters compared to preop. Femoral pseudoaneurysm has been successfully occluded and the femoral stents remain patent. 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 Distal runoff appears patent but at the level of the ankle is hard to follow. On the left there is complete occlusion with thrombosis of a large popliteal artery aneurysm distal runoff appears to be present  in the posterior tibial and peroneal reconstituted by geniculate collaterals.  Current Meds  Medication Sig  . acetaminophen (TYLENOL) 325 MG tablet Take 650 mg by mouth every 8 (eight) hours as needed. For general discomfort  . albuterol (PROVENTIL) (2.5 MG/3ML) 0.083% nebulizer solution Take 3 mLs (2.5 mg total) by nebulization every 4 (four) hours as needed for wheezing or shortness of breath.  Marland Kitchen amiodarone (PACERONE) 200 MG tablet Take 1 tablet (200 mg total) by mouth daily.  Marland Kitchen atorvastatin (LIPITOR) 40 MG tablet Take 40 mg by mouth at bedtime.  . budesonide-formoterol (SYMBICORT) 160-4.5 MCG/ACT inhaler Inhale 2 puffs into the lungs 2 (two) times daily.  . carvedilol (COREG) 3.125 MG tablet Take 1 tablet (3.125 mg total) by mouth 2 (two) times daily with a meal.  . diclofenac sodium (VOLTAREN) 1 % GEL Apply topically 3 (three) times daily.  . ferrous sulfate 325 (65 FE) MG tablet Take 325 mg by mouth daily.  . folic acid (FOLVITE) A999333 MCG tablet Take 400 mcg by mouth daily.  Marland Kitchen gabapentin (NEURONTIN) 100 MG capsule Take 100 mg by mouth daily.  Marland Kitchen guaiFENesin (MUCINEX) 600 MG 12 hr tablet Take 600 mg by mouth 2 (two) times daily. (Take every morning and at bedtime for cough)  . guaifenesin (ROBITUSSIN) 100 MG/5ML syrup Take 200 mg by mouth every 4 (four) hours as needed for cough.  Marland Kitchen ipratropium-albuterol (DUONEB) 0.5-2.5 (3) MG/3ML SOLN Take 3 mLs by nebulization 3 (three)  times daily.  Marland Kitchen lamoTRIgine (LAMICTAL) 25 MG tablet Take 50 mg by mouth at bedtime. Start on July 14th.  . latanoprost (XALATAN) 0.005 % ophthalmic solution Place 1 drop into both eyes at bedtime.  Marland Kitchen LORazepam (ATIVAN) 0.5 MG tablet Take 1 tablet (0.5 mg total) by mouth every 4 (four) hours as needed for anxiety. (Patient taking differently: Take 0.5 mg by mouth 2 (two) times daily as needed for anxiety. )  . magnesium oxide (MAG-OX) 400 MG tablet Take 1 tablet (400 mg total) by mouth every evening.  Marland Kitchen omeprazole (PRILOSEC)  20 MG capsule Take 20 mg by mouth daily.  . ondansetron (ZOFRAN) 8 MG tablet Take by mouth every 8 (eight) hours as needed for nausea.  Marland Kitchen oxyCODONE (OXY IR/ROXICODONE) 5 MG immediate release tablet Take 1 tablet (5 mg total) by mouth every 6 (six) hours as needed for moderate pain or severe pain.  . OXYGEN Inhale 2-3 L into the lungs as needed. For shortness of breath, respiratory distress or O2 sats <90.  . sevelamer carbonate (RENVELA) 800 MG tablet Take 800 mg by mouth 3 (three) times daily before meals.   . tiotropium (SPIRIVA) 18 MCG inhalation capsule Place 1 capsule (18 mcg total) into inhaler and inhale daily.    Past Medical History:  Diagnosis Date  . Allergic rhinitis   . Anemia    a. baseline hgb ~ 8  . Bladder cancer ([place])   . Chronic combined systolic and diastolic CHF (congestive heart failure) ([place])    a. echo 12/2014: EF 25-30%, anterior wall wall motion abnormalities; b. planned ischemic evaluation once patient is stable medically; c. echo 01/2015: EF 45-50%, no RWMA, mild AT, LA mildly dilated, mod pericardial effusion along LV free wall, no evidence of hemodynamic compromise  . Cognitive communication deficit   . COPD (chronic obstructive pulmonary disease) ([place])   . Dementia   . Detrusor sphincter dyssynergia   . ESRD on hemodialysis ([place])    a. Tuesday, Thursday, and Saturdays  . GERD (gastroesophageal reflux disease)   . History of septic shock    a. 01/2015  . History of small bowel obstruction    a. 01/2015  . Hydronephrosis   . Hypercholesteremia   . Hypertension   . Iliac aneurysm ([place])   . Incontinence   . Magnesium deficiency   . Mini stroke ([place])   . Overactive bladder   . PAF (paroxysmal atrial fibrillation) ([place])    a. new onset 12/2014 in the setting of UTI, sepsis, hypotension, and anemia; b. not on long term anticoagulation given anemia; c. family aware of stroke risk, they are ok with this; d. on amiodarone   . Prostate cancer [facility])     Past  Surgical History:  Procedure Laterality Date  . AV FISTULA PLACEMENT     left arm  . CYSTOSCOPY W/ URETERAL STENT PLACEMENT    . PERIPHERAL VASCULAR CATHETERIZATION N/A 01/24/2016   Procedure: Endovascular Repair/Stent Graft;  Surgeon: Katha Cabal, MD;  Location: [facility];  Service: Cardiovascular;  Laterality: N/A;  . PROSTATE SURGERY     Removal  . TRANSURETHRAL RESECTION OF BLADDER TUMOR WITH GYRUS (TURBT-GYRUS)    . URETERAL STENT PLACEMENT      Social History Social History  Substance Use Topics  . Smoking status: Former Smoker    Packs/day: 0.25    Years: 56.00    Quit date: 05/13/2015  . Smokeless tobacco: Never Used  . Alcohol use No  Family History Family History  Problem Relation Age of Onset  . Stroke Mother   No family history of bleeding clotting disorders porphyria or autoimmune disease.  Allergies  Allergen Reactions  . Penicillins Hives, Itching and Swelling    Has patient had a PCN reaction causing immediate rash, facial/tongue/throat swelling, SOB or lightheadedness with hypotension: Yes Has patient had a PCN reaction causing severe rash involving mucus membranes or skin necrosis: No Has patient had a PCN reaction that required hospitalization No Has patient had a PCN reaction occurring within the last 10 years: No If all of the above answers are "NO", then may proceed with Cephalosporin use.     REVIEW OF SYSTEMS (Negative unless checked)  Constitutional: [] Weight loss  [] Fever  [] Chills Cardiac: [] Chest pain   [] Chest pressure   [] Palpitations   [x] Shortness of breath when laying flat   [x] Shortness of breath with exertion. Vascular:  [] Pain in legs with walking   [x] Pain in legs at rest  [] History of DVT   [] Phlebitis   [] Swelling in legs   [] Varicose veins   [] Non-healing ulcers Pulmonary:   [x] Uses home oxygen   [] Productive cough   [] Hemoptysis   [] Wheeze  [x] COPD   [] Asthma Neurologic:  [] Dizziness   [] Seizures   [] History of  stroke   [] History of TIA  [] Aphasia   [] Vissual changes   [] Weakness or numbness in arm   [x] Weakness or numbness in leg Musculoskeletal:   [] Joint swelling   [x] Joint pain   [x] Low back pain Hematologic:  [] Easy bruising  [] Easy bleeding   [] Hypercoagulable state   [] Anemic Gastrointestinal:  [] Diarrhea   [] Vomiting  [] Gastroesophageal reflux/heartburn   [] Difficulty swallowing. Genitourinary:  [x] Chronic kidney disease   [] Difficult urination  [] Frequent urination   [] Blood in urine Skin:  [] Rashes   [] Ulcers  Psychological:  [x] History of anxiety   []  History of major depression.  Physical Examination  Vitals:   08/19/16 1406  BP: 107/75  Pulse: (!) 102  Resp: 16  Weight: 174 lb (78.9 kg)  Height: 5\' 10"  (1.778 m)   Body mass index is 24.97 kg/m. Gen: WD/Poorly nourished, mild distress patient presents in a wheelchair Head: [place]/AT, Moderate temporalis wasting.  Ear/Nose/Throat: Hearing grossly intact, nares w/o erythema or drainage, poor dentition Eyes: PER, EOMI, sclera nonicteric.  Neck: Supple, no masses.  No bruit or JVD.  Pulmonary:  Breath sounds diminished bilaterally, clear to auscultation bilaterally, no use of accessory muscles.  Cardiac: RRR, normal S1, S2, no Murmurs. Vascular: Palpable thrombosed right pseudoaneurysm both lower extremities appear chronically ischemic with poor capillary refill there are no open wounds or sores at this time. There are mild atrophic changes noted bilaterally. There is 1+ pitting edema laterally. Vessel Right Left  Radial Palpable Palpable  Ulnar Palpable Palpable  Brachial Palpable Palpable  Carotid Palpable Palpable  Femoral Palpable Palpable  Popliteal Palpable Palpable  PT Palpable Palpable  DP Palpable Palpable   Gastrointestinal: soft, non-distended. No guarding/no peritoneal signs.  Musculoskeletal: M/S 5/5 throughout.  No deformity or atrophy.  Neurologic: CN 2-12 intact. Pain and light touch intact in extremities.   Symmetrical.  Speech is fluent. Motor exam as listed above. Psychiatric: Judgment intact, Mood & affect appropriate for pt's clinical situation. Dermatologic: No rashes or ulcers noted.  No changes consistent with cellulitis. Lymph : No Cervical lymphadenopathy, no lichenification or skin changes of chronic lymphedema.  CBC Lab Results  Component Value Date   WBC 5.3 05/31/2016   HGB 8.8 (L) 05/31/2016  HCT 28.1 (L) 05/31/2016   MCV 86.4 05/31/2016   PLT 196 05/31/2016    BMET    Component Value Date/Time   NA 139 05/31/2016 1121   NA 141 01/20/2015 0430   K 3.5 05/31/2016 1121   K 3.6 01/20/2015 0430   CL 96 (L) 05/31/2016 1121   CL 99 (L) 01/20/2015 0430   CO2 33 (H) 05/31/2016 1121   CO2 30 01/20/2015 0430   GLUCOSE 104 (H) 05/31/2016 1121   GLUCOSE 119 (H) 01/20/2015 0430   BUN 52 (H) 05/31/2016 1121   BUN 32 (H) 01/20/2015 0430   CREATININE 4.10 (H) 05/31/2016 1121   CREATININE 6.73 (H) 01/20/2015 0430   CALCIUM 8.5 (L) 05/31/2016 1121   CALCIUM 7.8 (L) 01/20/2015 0430   GFRNONAA 13 (L) 05/31/2016 1121   GFRNONAA 7 (L) 01/20/2015 0430   GFRAA 15 (L) 05/31/2016 1121   GFRAA 9 (L) 01/20/2015 0430   CrCl cannot be calculated (Patient's most recent lab result is older than the maximum 21 days allowed.).  COAG Lab Results  Component Value Date   INR 1.3 01/18/2015   INR 1.2 06/28/2014   INR 1.0 12/08/2011    Radiology No results found.   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 He is already experiencing rest pain on the left side likely secondary to thrombosis popliteal artery aneurysm and that his left leg is in jeopardy. He repeatedly states throughout the appointment that he does not want to have an amputation and that anything should be done that could be done to prevent this. I  believe his right limb is at a very large risk. The risks and benefits for stenting of his popliteal artery aneurysm were reviewed he is well familiar with this as he is undergone multiple stenting procedures for his abdominal aortic aneurysm and his femoral artery aneurysm. He agrees to proceed.  2. AAA (abdominal aortic aneurysm) without rupture (HCC) Presently the abdominal aortic aneurysm is well treated type II endoleak is present but there appears to be shrinkage of the sac and therefore nothing needs to be done interventionally regarding this leak. The type I leak has eliminated and he has good seal proximally and distally. I will continue to follow his aneurysm at 6 month intervals and watch for any change in sac growth which could indicate the need for embolization and treatment of his type II endoleak. I discussed this with him and he acknowledges this.  3. Pseudoaneurysm of right femoral artery (HCC) Pseudoaneurysm appears to be successfully excluded and the pseudoaneurysm sac is shrinking compared to last study. No further interventions at this time but this area should continue to be monitored as femoral stents makeshift and re-pressurized his aneurysm sac.  4. ESRD on hemodialysis [facility]) He will continue on dialysis as is scheduled He will continue to follow up with nephrology  5. Chronic obstructive pulmonary disease, unspecified COPD type ([place]) He will continue home oxygen therapy. Given this condition I believe anesthesia should be involved in his stenting procedure to more appropriately monitor his hemodynamics. I'll make arrangements for anesthesia to be present at the time of treatment of his right popliteal artery aneurysm.  6. Essential (primary) hypertension Continue antihypertensive medications as already ordered and reviewed, no changes at this time.  Continue statin as ordered and reviewed, no changes at this  time    Hortencia Pilar, MD  08/19/2016 4:22 PM

## 2016-08-20 ENCOUNTER — Encounter
Admission: RE | Admit: 2016-08-20 | Discharge: 2016-08-20 | Disposition: A | Discharge status: Home / Self Care | Payer: Medicare Other | Source: Ambulatory Visit | Attending: Vascular Surgery | Admitting: Vascular Surgery

## 2016-08-20 ENCOUNTER — Inpatient Hospital Stay: Admission: RE | Admit: 2016-08-20 | Payer: Medicare Other | Source: Ambulatory Visit

## 2016-08-20 ENCOUNTER — Other Ambulatory Visit (INDEPENDENT_AMBULATORY_CARE_PROVIDER_SITE_OTHER): Payer: Self-pay | Admitting: Vascular Surgery

## 2016-08-20 HISTORY — DX: Unspecified urinary incontinence: R32

## 2016-08-20 HISTORY — DX: Abdominal aortic aneurysm, without rupture, unspecified: I71.40

## 2016-08-20 HISTORY — DX: Cerebral infarction, unspecified: I63.9

## 2016-08-20 HISTORY — DX: Cardiac arrhythmia, unspecified: I49.9

## 2016-08-20 HISTORY — DX: Abdominal aortic aneurysm, without rupture: I71.4

## 2016-08-20 MED ORDER — CLINDAMYCIN PHOSPHATE 300 MG/50ML IV SOLN
300.0000 mg | Freq: Once | INTRAVENOUS | Status: AC
  Administered 2016-08-21: 300 mg via INTRAVENOUS

## 2016-08-20 NOTE — Patient Instructions (Signed)
 VEIN AND VASCULAR SURGERY  Your procedure is scheduled with Dr                               On:  Date: 08/21/16                   Time: [id number] AM     On arrival go [facility] on first floor of the [facility]. Your arrival time will be approximately 1-1.5 hours prior to you procedure start time. This allows time to check lab results and prep you for the procedure.  If your procedure time change you will be notified by the doctor's office.    _x____Do not eat or drink 8 hours prior to your procedure.    __x___Take all your morning medications with sips of water.  _____Continue Plavix and/or Aspirin.  _____Take half of your bedtime Insulin.  _____Take half of  your morning Insulin.  _____Bring your current medications in their bottle with you.  _____Do not take Metformin 24 hours before procedure or 48 hours after you procedure.  _____Stop Pradaxa, Xarelto, Coumadin, Effient, or Eliquis for 3 days prior to your                      procedure.  Your recovery time will be 1-2 hours (1 hour for a  vein stick, 2 hours for an artery stick )  Please call Dr Lucky Cowboy and Dr Nino Parsley office with any questions or concerns: [phone].  You will need to have someone drive you home and stay with you the night of the procedure.

## 2016-08-21 ENCOUNTER — Encounter
Admission: AD | Disposition: A | Discharge status: Skilled Nursing Facility | Payer: Self-pay | Source: Ambulatory Visit | Attending: Vascular Surgery

## 2016-08-21 ENCOUNTER — Ambulatory Visit: Payer: Medicare Other | Admitting: Certified Registered Nurse Anesthetist

## 2016-08-21 ENCOUNTER — Inpatient Hospital Stay
Admission: AD | Admit: 2016-08-21 | Discharge: 2016-08-28 | DRG: 270 | Disposition: A | Discharge status: Skilled Nursing Facility | Payer: Medicare Other | Source: Ambulatory Visit | Attending: Vascular Surgery | Admitting: Vascular Surgery

## 2016-08-21 ENCOUNTER — Encounter: Payer: Self-pay | Admitting: Anesthesiology

## 2016-08-21 DIAGNOSIS — I70221 Atherosclerosis of native arteries of extremities with rest pain, right leg: Secondary | ICD-10-CM | POA: Diagnosis not present

## 2016-08-21 DIAGNOSIS — I959 Hypotension, unspecified: Secondary | ICD-10-CM | POA: Diagnosis not present

## 2016-08-21 DIAGNOSIS — J449 Chronic obstructive pulmonary disease, unspecified: Secondary | ICD-10-CM | POA: Diagnosis present

## 2016-08-21 DIAGNOSIS — I714 Abdominal aortic aneurysm, without rupture: Secondary | ICD-10-CM | POA: Diagnosis present

## 2016-08-21 DIAGNOSIS — I480 Paroxysmal atrial fibrillation: Secondary | ICD-10-CM | POA: Diagnosis present

## 2016-08-21 DIAGNOSIS — I70223 Atherosclerosis of native arteries of extremities with rest pain, bilateral legs: Secondary | ICD-10-CM | POA: Diagnosis not present

## 2016-08-21 DIAGNOSIS — I5042 Chronic combined systolic (congestive) and diastolic (congestive) heart failure: Secondary | ICD-10-CM | POA: Diagnosis present

## 2016-08-21 DIAGNOSIS — M79604 Pain in right leg: Secondary | ICD-10-CM | POA: Diagnosis present

## 2016-08-21 DIAGNOSIS — Z79899 Other long term (current) drug therapy: Secondary | ICD-10-CM | POA: Diagnosis not present

## 2016-08-21 DIAGNOSIS — Z87891 Personal history of nicotine dependence: Secondary | ICD-10-CM | POA: Diagnosis not present

## 2016-08-21 DIAGNOSIS — Z8546 Personal history of malignant neoplasm of prostate: Secondary | ICD-10-CM

## 2016-08-21 DIAGNOSIS — D631 Anemia in chronic kidney disease: Secondary | ICD-10-CM | POA: Diagnosis present

## 2016-08-21 DIAGNOSIS — N186 End stage renal disease: Secondary | ICD-10-CM | POA: Diagnosis present

## 2016-08-21 DIAGNOSIS — Z7901 Long term (current) use of anticoagulants: Secondary | ICD-10-CM | POA: Diagnosis not present

## 2016-08-21 DIAGNOSIS — L89323 Pressure ulcer of left buttock, stage 3: Secondary | ICD-10-CM | POA: Diagnosis present

## 2016-08-21 DIAGNOSIS — I724 Aneurysm of artery of lower extremity: Secondary | ICD-10-CM | POA: Diagnosis present

## 2016-08-21 DIAGNOSIS — L8990 Pressure ulcer of unspecified site, unspecified stage: Secondary | ICD-10-CM | POA: Insufficient documentation

## 2016-08-21 DIAGNOSIS — I132 Hypertensive heart and chronic kidney disease with heart failure and with stage 5 chronic kidney disease, or end stage renal disease: Secondary | ICD-10-CM | POA: Diagnosis present

## 2016-08-21 DIAGNOSIS — K219 Gastro-esophageal reflux disease without esophagitis: Secondary | ICD-10-CM | POA: Diagnosis present

## 2016-08-21 DIAGNOSIS — Z8551 Personal history of malignant neoplasm of bladder: Secondary | ICD-10-CM

## 2016-08-21 DIAGNOSIS — F0390 Unspecified dementia without behavioral disturbance: Secondary | ICD-10-CM | POA: Diagnosis present

## 2016-08-21 DIAGNOSIS — I723 Aneurysm of iliac artery: Secondary | ICD-10-CM | POA: Diagnosis present

## 2016-08-21 DIAGNOSIS — Z992 Dependence on renal dialysis: Secondary | ICD-10-CM

## 2016-08-21 DIAGNOSIS — Z8673 Personal history of transient ischemic attack (TIA), and cerebral infarction without residual deficits: Secondary | ICD-10-CM

## 2016-08-21 DIAGNOSIS — E7800 Pure hypercholesterolemia, unspecified: Secondary | ICD-10-CM | POA: Diagnosis present

## 2016-08-21 DIAGNOSIS — N2581 Secondary hyperparathyroidism of renal origin: Secondary | ICD-10-CM | POA: Diagnosis present

## 2016-08-21 HISTORY — PX: PERIPHERAL VASCULAR CATHETERIZATION: SHX172C

## 2016-08-21 LAB — CBC
HCT: 34.5 % — ABNORMAL LOW (ref 40.0–52.0)
HEMOGLOBIN: 10.7 g/dL — AB (ref 13.0–18.0)
MCH: 25.5 pg — AB (ref 26.0–34.0)
MCHC: 30.9 g/dL — AB (ref 32.0–36.0)
MCV: 82.6 fL (ref 80.0–100.0)
Platelets: 287 10*3/uL (ref 150–440)
RBC: 4.18 MIL/uL — ABNORMAL LOW (ref 4.40–5.90)
RDW: 18.4 % — ABNORMAL HIGH (ref 11.5–14.5)
WBC: 17.2 10*3/uL — ABNORMAL HIGH (ref 3.8–10.6)

## 2016-08-21 LAB — CREATININE, SERUM
Creatinine, Ser: 5.85 mg/dL — ABNORMAL HIGH (ref 0.61–1.24)
GFR calc non Af Amer: 9 mL/min — ABNORMAL LOW (ref >60)
GFR, EST AFRICAN AMERICAN: 10 mL/min — AB (ref >60)

## 2016-08-21 LAB — POTASSIUM (ARMC VASCULAR LAB ONLY): Potassium (ARMC vascular lab): 3.4 — ABNORMAL LOW (ref 3.5–5.1)

## 2016-08-21 LAB — PROTIME-INR
INR: 1.33
PROTHROMBIN TIME: 16.6 s — AB (ref 11.4–15.2)

## 2016-08-21 LAB — GLUCOSE, CAPILLARY: GLUCOSE-CAPILLARY: 80 mg/dL (ref 65–99)

## 2016-08-21 LAB — APTT: aPTT: 37 s — ABNORMAL HIGH (ref 24–36)

## 2016-08-21 LAB — MRSA PCR SCREENING: MRSA BY PCR: NEGATIVE

## 2016-08-21 SURGERY — LOWER EXTREMITY ANGIOGRAPHY
Anesthesia: General | Site: Leg Lower | Laterality: Right

## 2016-08-21 MED ORDER — IPRATROPIUM-ALBUTEROL 0.5-2.5 (3) MG/3ML IN SOLN
3.0000 mL | Freq: Three times a day (TID) | RESPIRATORY_TRACT | Status: DC
  Administered 2016-08-21 – 2016-08-28 (×19): 3 mL via RESPIRATORY_TRACT
  Filled 2016-08-21 (×22): qty 3

## 2016-08-21 MED ORDER — FERROUS SULFATE 325 (65 FE) MG PO TABS
325.0000 mg | ORAL_TABLET | Freq: Every day | ORAL | Status: DC
  Administered 2016-08-21 – 2016-08-28 (×8): 325 mg via ORAL
  Filled 2016-08-21 (×8): qty 1

## 2016-08-21 MED ORDER — OXYCODONE HCL 5 MG PO TABS
5.0000 mg | ORAL_TABLET | ORAL | Status: DC | PRN
  Administered 2016-08-22: 5 mg via ORAL
  Administered 2016-08-23 – 2016-08-25 (×4): 10 mg via ORAL
  Administered 2016-08-26: 5 mg via ORAL
  Administered 2016-08-26 – 2016-08-28 (×7): 10 mg via ORAL
  Filled 2016-08-21 (×11): qty 2
  Filled 2016-08-21: qty 1
  Filled 2016-08-21: qty 2

## 2016-08-21 MED ORDER — ACETAMINOPHEN 650 MG RE SUPP: 325.0000 mg | RECTAL | Status: DC | PRN

## 2016-08-21 MED ORDER — ALBUTEROL SULFATE (2.5 MG/3ML) 0.083% IN NEBU: 2.5000 mg | INHALATION_SOLUTION | RESPIRATORY_TRACT | Status: DC | PRN

## 2016-08-21 MED ORDER — DOCUSATE SODIUM 100 MG PO CAPS
100.0000 mg | ORAL_CAPSULE | Freq: Every day | ORAL | Status: DC
  Administered 2016-08-22 – 2016-08-28 (×7): 100 mg via ORAL
  Filled 2016-08-21 (×7): qty 1

## 2016-08-21 MED ORDER — ALUM & MAG HYDROXIDE-SIMETH 200-200-20 MG/5ML PO SUSP: 15.0000 mL | ORAL | Status: DC | PRN

## 2016-08-21 MED ORDER — VASOPRESSIN 20 UNIT/ML IV SOLN
INTRAVENOUS | Status: DC | PRN
  Administered 2016-08-21 (×8): 2 [IU] via INTRAVENOUS

## 2016-08-21 MED ORDER — AMIODARONE HCL 200 MG PO TABS
200.0000 mg | ORAL_TABLET | Freq: Every day | ORAL | Status: DC
  Administered 2016-08-21 – 2016-08-28 (×8): 200 mg via ORAL
  Filled 2016-08-21 (×8): qty 1

## 2016-08-21 MED ORDER — SODIUM CHLORIDE 0.9 % IV SOLN
INTRAVENOUS | Status: DC | PRN
  Administered 2016-08-21: 100 ug/min via INTRAVENOUS

## 2016-08-21 MED ORDER — DEXTROSE 5 % IV SOLN
0.0000 ug/min | INTRAVENOUS | Status: DC
  Administered 2016-08-21: 30 ug/min via INTRAVENOUS
  Filled 2016-08-21: qty 1

## 2016-08-21 MED ORDER — SODIUM CHLORIDE 0.9 % IV SOLN
INTRAVENOUS | Status: DC
  Administered 2016-08-21: 12:00:00 via INTRAVENOUS

## 2016-08-21 MED ORDER — ATORVASTATIN CALCIUM 20 MG PO TABS
40.0000 mg | ORAL_TABLET | Freq: Every day | ORAL | Status: DC
  Administered 2016-08-21 – 2016-08-27 (×7): 40 mg via ORAL
  Filled 2016-08-21: qty 1
  Filled 2016-08-21 (×7): qty 2

## 2016-08-21 MED ORDER — ONDANSETRON HCL 4 MG/2ML IJ SOLN: 4.0000 mg | Freq: Four times a day (QID) | INTRAMUSCULAR | Status: DC | PRN

## 2016-08-21 MED ORDER — NITROGLYCERIN 5 MG/ML IV SOLN
INTRAVENOUS | Status: AC
  Filled 2016-08-21: qty 10

## 2016-08-21 MED ORDER — IOPAMIDOL (ISOVUE-300) INJECTION 61%
INTRAVENOUS | Status: DC | PRN
  Administered 2016-08-21: 60 mL via INTRA_ARTERIAL

## 2016-08-21 MED ORDER — CARVEDILOL 3.125 MG PO TABS
3.1250 mg | ORAL_TABLET | Freq: Two times a day (BID) | ORAL | Status: DC
  Administered 2016-08-22 – 2016-08-28 (×13): 3.125 mg via ORAL
  Filled 2016-08-21 (×15): qty 1

## 2016-08-21 MED ORDER — ONDANSETRON HCL 4 MG/2ML IJ SOLN
INTRAMUSCULAR | Status: DC | PRN
  Administered 2016-08-21: 4 mg via INTRAVENOUS

## 2016-08-21 MED ORDER — LAMOTRIGINE 100 MG PO TABS
50.0000 mg | ORAL_TABLET | Freq: Every day | ORAL | Status: DC
  Administered 2016-08-21 – 2016-08-27 (×7): 50 mg via ORAL
  Filled 2016-08-21: qty 1
  Filled 2016-08-21: qty 2
  Filled 2016-08-21 (×4): qty 1
  Filled 2016-08-21: qty 2

## 2016-08-21 MED ORDER — FOLIC ACID 1 MG PO TABS
500.0000 ug | ORAL_TABLET | Freq: Every day | ORAL | Status: DC
  Administered 2016-08-22 – 2016-08-28 (×7): 0.5 mg via ORAL
  Filled 2016-08-21 (×9): qty 1

## 2016-08-21 MED ORDER — PHENYLEPHRINE HCL 10 MG/ML IJ SOLN
50.0000 ug/min | INTRAVENOUS | Status: DC
  Administered 2016-08-21: 30 ug/min via INTRAVENOUS
  Filled 2016-08-21: qty 1

## 2016-08-21 MED ORDER — FENTANYL CITRATE (PF) 100 MCG/2ML IJ SOLN: 25.0000 ug | INTRAMUSCULAR | Status: DC | PRN

## 2016-08-21 MED ORDER — PANTOPRAZOLE SODIUM 40 MG PO TBEC
40.0000 mg | DELAYED_RELEASE_TABLET | Freq: Every day | ORAL | Status: DC
  Administered 2016-08-22 – 2016-08-28 (×7): 40 mg via ORAL
  Filled 2016-08-21 (×7): qty 1

## 2016-08-21 MED ORDER — SEVELAMER CARBONATE 800 MG PO TABS
800.0000 mg | ORAL_TABLET | Freq: Three times a day (TID) | ORAL | Status: DC
  Administered 2016-08-22 – 2016-08-28 (×18): 800 mg via ORAL
  Filled 2016-08-21 (×18): qty 1

## 2016-08-21 MED ORDER — PRO-STAT SUGAR FREE PO LIQD
30.0000 mL | Freq: Every evening | ORAL | Status: DC
  Administered 2016-08-23 – 2016-08-27 (×5): 30 mL via ORAL

## 2016-08-21 MED ORDER — LIDOCAINE HCL (CARDIAC) 20 MG/ML IV SOLN
INTRAVENOUS | Status: DC | PRN
  Administered 2016-08-21: 60 mg via INTRAVENOUS

## 2016-08-21 MED ORDER — ACETAMINOPHEN 325 MG PO TABS
325.0000 mg | ORAL_TABLET | ORAL | Status: DC | PRN
  Administered 2016-08-24 – 2016-08-26 (×2): 650 mg via ORAL
  Filled 2016-08-21: qty 2

## 2016-08-21 MED ORDER — DEXAMETHASONE SODIUM PHOSPHATE 10 MG/ML IJ SOLN
INTRAMUSCULAR | Status: DC | PRN
  Administered 2016-08-21: 4 mg via INTRAVENOUS

## 2016-08-21 MED ORDER — GUAIFENESIN 100 MG/5ML PO SYRP
200.0000 mg | ORAL_SOLUTION | ORAL | Status: DC | PRN
  Filled 2016-08-21: qty 10

## 2016-08-21 MED ORDER — FENTANYL CITRATE (PF) 100 MCG/2ML IJ SOLN
INTRAMUSCULAR | Status: DC | PRN
  Administered 2016-08-21 (×2): 25 ug via INTRAVENOUS

## 2016-08-21 MED ORDER — CLINDAMYCIN PHOSPHATE 300 MG/50ML IV SOLN
INTRAVENOUS | Status: AC
  Filled 2016-08-21: qty 50

## 2016-08-21 MED ORDER — LIDOCAINE HCL (PF) 1 % IJ SOLN
INTRAMUSCULAR | Status: AC
  Filled 2016-08-21: qty 30

## 2016-08-21 MED ORDER — HEPARIN (PORCINE) IN NACL 100-0.45 UNIT/ML-% IJ SOLN
2250.0000 [IU]/h | INTRAMUSCULAR | Status: DC
  Administered 2016-08-21: 1000 [IU]/h via INTRAVENOUS
  Administered 2016-08-22: 2400 [IU]/h via INTRAVENOUS
  Administered 2016-08-22: 1300 [IU]/h via INTRAVENOUS
  Administered 2016-08-23 (×3): 1650 [IU]/h via INTRAVENOUS
  Administered 2016-08-24: 1950 [IU]/h via INTRAVENOUS
  Administered 2016-08-24: 1800 [IU]/h via INTRAVENOUS
  Administered 2016-08-25: 2100 [IU]/h via INTRAVENOUS
  Administered 2016-08-26: 2250 [IU]/h via INTRAVENOUS
  Administered 2016-08-26: 2100 [IU]/h via INTRAVENOUS
  Administered 2016-08-26 – 2016-08-27 (×3): 2250 [IU]/h via INTRAVENOUS
  Filled 2016-08-21 (×15): qty 250

## 2016-08-21 MED ORDER — HEPARIN SODIUM (PORCINE) 1000 UNIT/ML IJ SOLN
INTRAMUSCULAR | Status: DC | PRN
  Administered 2016-08-21: 6000 [IU] via INTRAVENOUS

## 2016-08-21 MED ORDER — NITROGLYCERIN 1 MG/10 ML FOR IR/CATH LAB
INTRA_ARTERIAL | Status: DC | PRN
  Administered 2016-08-21: 750 ug via INTRA_ARTERIAL

## 2016-08-21 MED ORDER — HYDROMORPHONE HCL 1 MG/ML IJ SOLN: 0.5000 mg | INTRAMUSCULAR | Status: DC | PRN

## 2016-08-21 MED ORDER — ONDANSETRON HCL 4 MG/2ML IJ SOLN: 4.0000 mg | Freq: Once | INTRAMUSCULAR | Status: DC | PRN

## 2016-08-21 MED ORDER — PHENYLEPHRINE HCL 10 MG/ML IJ SOLN
INTRAMUSCULAR | Status: DC | PRN
  Administered 2016-08-21 (×7): 200 ug via INTRAVENOUS

## 2016-08-21 MED ORDER — HYDROMORPHONE HCL 1 MG/ML IJ SOLN: 1.0000 mg | Freq: Once | INTRAMUSCULAR | Status: DC

## 2016-08-21 MED ORDER — HEPARIN BOLUS VIA INFUSION
2350.0000 [IU] | Freq: Once | INTRAVENOUS | Status: AC
  Administered 2016-08-21: 2350 [IU] via INTRAVENOUS
  Filled 2016-08-21: qty 2350

## 2016-08-21 MED ORDER — GUAIFENESIN ER 600 MG PO TB12
600.0000 mg | ORAL_TABLET | Freq: Two times a day (BID) | ORAL | Status: DC
  Administered 2016-08-21 – 2016-08-28 (×13): 600 mg via ORAL
  Filled 2016-08-21 (×13): qty 1

## 2016-08-21 MED ORDER — MAGNESIUM OXIDE 400 (241.3 MG) MG PO TABS
400.0000 mg | ORAL_TABLET | Freq: Every evening | ORAL | Status: DC
  Administered 2016-08-21 – 2016-08-28 (×7): 400 mg via ORAL
  Filled 2016-08-21 (×7): qty 1

## 2016-08-21 MED ORDER — TIOTROPIUM BROMIDE MONOHYDRATE 18 MCG IN CAPS
18.0000 ug | ORAL_CAPSULE | Freq: Every day | RESPIRATORY_TRACT | Status: DC
  Administered 2016-08-22 – 2016-08-28 (×7): 18 ug via RESPIRATORY_TRACT
  Filled 2016-08-21 (×2): qty 5

## 2016-08-21 MED ORDER — METHYLPREDNISOLONE SODIUM SUCC 125 MG IJ SOLR: 125.0000 mg | INTRAMUSCULAR | Status: DC | PRN

## 2016-08-21 MED ORDER — FAMOTIDINE 20 MG PO TABS: 40.0000 mg | ORAL_TABLET | ORAL | Status: DC | PRN

## 2016-08-21 MED ORDER — METOPROLOL TARTRATE 5 MG/5ML IV SOLN: 5.0000 mg | Freq: Four times a day (QID) | INTRAVENOUS | Status: DC

## 2016-08-21 MED ORDER — GABAPENTIN 100 MG PO CAPS
100.0000 mg | ORAL_CAPSULE | Freq: Every day | ORAL | Status: DC
  Administered 2016-08-21 – 2016-08-27 (×7): 100 mg via ORAL
  Filled 2016-08-21 (×7): qty 1

## 2016-08-21 MED ORDER — LATANOPROST 0.005 % OP SOLN
1.0000 [drp] | Freq: Every day | OPHTHALMIC | Status: DC
  Administered 2016-08-21 – 2016-08-27 (×6): 1 [drp] via OPHTHALMIC
  Filled 2016-08-21: qty 2.5

## 2016-08-21 MED ORDER — SODIUM CHLORIDE 0.9 % IJ SOLN
INTRAMUSCULAR | Status: AC
  Filled 2016-08-21: qty 50

## 2016-08-21 MED ORDER — PROPOFOL 10 MG/ML IV BOLUS
INTRAVENOUS | Status: DC | PRN
  Administered 2016-08-21: 130 mg via INTRAVENOUS

## 2016-08-21 SURGICAL SUPPLY — 32 items
BALLN ARMADA 14X80X80 (BALLOONS) ×3
BALLN ARMADA 2.5X100X150 (BALLOONS) ×3
BALLN DORADO 10X80X80 (BALLOONS) ×3
BALLN ULTRVRSE 2X20X150 (BALLOONS) ×3
BALLN UTLRAVERSE 2X60X150 (BALLOONS) ×3
BALLOON ARMADA 14X80X80 (BALLOONS) ×2 IMPLANT
BALLOON ARMADA 2.5X100X150 (BALLOONS) ×2 IMPLANT
BALLOON DORADO 10X80X80 (BALLOONS) ×2 IMPLANT
BALLOON ULTRVRSE 2X20X150 (BALLOONS) ×2 IMPLANT
BALLOON UTLRAVERSE 2X60X150 (BALLOONS) ×2 IMPLANT
CANISTER PENUMBRA MAX (MISCELLANEOUS) ×3 IMPLANT
CATH BEACON 5.038 65CM KMP-01 (CATHETERS) ×3 IMPLANT
CATH INDIGO 8 XTORQ TIP 115CM (CATHETERS) ×3 IMPLANT
DERMABOND ADVANCED (GAUZE/BANDAGES/DRESSINGS) ×1
DERMABOND ADVANCED .7 DNX12 (GAUZE/BANDAGES/DRESSINGS) ×2 IMPLANT
DEVICE CLOSURE PERCLS PRGLD 6F (VASCULAR PRODUCTS) ×6 IMPLANT
DEVICE PRESTO INFLATION (MISCELLANEOUS) ×3 IMPLANT
GLIDEWIRE STIFF .35X180X3 HYDR (WIRE) ×3 IMPLANT
PACK ANGIOGRAPHY (CUSTOM PROCEDURE TRAY) ×3 IMPLANT
PERCLOSE PROGLIDE 6F (VASCULAR PRODUCTS) ×9
SET INTRO CAPELLA COAXIAL (SET/KITS/TRAYS/PACK) ×3 IMPLANT
SHEATH BRITE TIP 6FRX11 (SHEATH) ×3 IMPLANT
SHEATH PINNACLE 11FRX10 (SHEATH) ×3 IMPLANT
STENT VIABAHN 11X100X120 (Permanent Stent) ×3 IMPLANT
STENT VIABAHN 13X100X120 (Permanent Stent) ×9 IMPLANT
SUT MNCRL AB 4-0 PS2 18 (SUTURE) ×3 IMPLANT
TUBING ASPIRATION INDIGO (MISCELLANEOUS) ×3 IMPLANT
VALVE HEMO TOUHY BORST Y (VALVE) ×3 IMPLANT
WIRE AMPLATZ SSTIFF .035X260CM (WIRE) ×3 IMPLANT
WIRE G 018X200 V18 (WIRE) ×3 IMPLANT
WIRE G V18X300CM (WIRE) ×3 IMPLANT
WIRE J 3MM .035X145CM (WIRE) ×3 IMPLANT

## 2016-08-21 NOTE — Transfer of Care (Signed)
Immediate Anesthesia Transfer of Care Note  Patient: [name]  Procedure(s) Performed: Procedure(s): LOWER EXTREMITY ANGIOGRAPHY (Right) Lower Extremity Intervention  Patient Location: PACU  Anesthesia Type:General  Level of Consciousness: awake, alert , oriented and patient cooperative  Airway & Oxygen Therapy: Patient Spontanous Breathing and Patient connected to nasal cannula oxygen  Post-op Assessment: Report given to RN, Post -op Vital signs reviewed and unstable, Anesthesiologist notified and Patient moving all extremities X 4  Post vital signs: Reviewed and stable  Last Vitals:  Vitals:   08/21/16 1541 08/21/16 1545  BP: (!) 80/63   Pulse: 100 (!) 102  Resp: 14 (!) 25  Temp:      Last Pain:  Vitals:   08/21/16 1541  TempSrc: Tympanic         Complications: No apparent anesthesia complications

## 2016-08-21 NOTE — Consult Note (Signed)
Name: [name] MRN: [id number] DOB: 02-05-1941    ADMISSION DATE:  08/21/2016 CONSULTATION DATE: 08/21/2016  REFERRING MD: 08/21/2016  CHIEF COMPLAINT: s/p right lower extremity angiogram due to large popliteal aneurysm  BRIEF PATIENT DESCRIPTION:  This is a 75 yo male admitted 11/29 s/p angiogram of right lower extremity became hypotensive requiring vasopressor.  SIGNIFICANT EVENTS  11/29-Pt admitted to [facility] Acute Care unit s/p right lower extremity angiogram became hypotensive during procedure requiring vasopressor.  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  He presented to [facility] for an elective right lower extremity angiogram due to a large popliteal aneurysm.  During the procedure he became hypotensive requiring vasopressor and admitted to [facility] stepdown unit. PCCM consulted 11/29 for medical management.  PAST MEDICAL HISTORY :   has a past medical history of AAA (abdominal aortic aneurysm) ([place]); Allergic rhinitis; Anemia; Bladder cancer ([place]); Chronic combined systolic and diastolic CHF (congestive heart failure) ([place]); Cognitive communication deficit; COPD (chronic obstructive pulmonary disease) ([place]); Dementia; Detrusor sphincter dyssynergia; Dysrhythmia; ESRD on hemodialysis ([place]); GERD (gastroesophageal reflux disease); History of septic shock; History of small bowel obstruction; Hydronephrosis; Hypercholesteremia; Hypertension; Iliac aneurysm ([place]); Incontinence; Incontinence of urine; Magnesium deficiency;  Mini stroke ([place]); Overactive bladder; PAF (paroxysmal atrial fibrillation) ([place]); Prostate cancer [facility]); and Stroke ([place]).  has a past surgical history that includes Cystoscopy w/ ureteral stent placement; Transurethral resection of bladder tumor with gyrus (turbt-gyrus); Ureteral stent placement; Prostate surgery; AV fistula placement; and Cardiac catheterization (N/A, 01/24/2016). Prior to Admission medications   Medication Sig Start Date End Date Taking? Authorizing Provider  albuterol (PROVENTIL) (2.5 MG/3ML) 0.083% nebulizer solution Take 3 mLs (2.5 mg total) by nebulization every 4 (four) hours as needed for wheezing or shortness of breath. 06/10/15  Yes Fritzi Mandes, MD  Amino Acids-Protein Hydrolys (FEEDING SUPPLEMENT, PRO-STAT SUGAR FREE 64,) LIQD Take 30 mLs by mouth every evening.   Yes Historical Provider, MD  amiodarone (PACERONE) 200 MG tablet Take 1 tablet (200 mg total) by mouth daily. 09/13/15  Yes Gladstone Lighter, MD  atorvastatin (LIPITOR) 40 MG tablet Take 40 mg by mouth at bedtime.   Yes Historical Provider, MD  budesonide-formoterol (SYMBICORT) 160-4.5 MCG/ACT inhaler Inhale 2 puffs into the lungs 2 (two) times daily.   Yes Historical Provider, MD  carvedilol (COREG) 3.125 MG tablet Take 1 tablet (3.125 mg total) by mouth 2 (two) times daily with a meal. 03/14/16  Yes Gladstone Lighter, MD  diclofenac sodium (VOLTAREN) 1 % GEL Apply 2 g topically 3 (three) times daily.    Yes Historical Provider, MD  ferrous sulfate 325 (65 FE) MG tablet Take 325 mg by mouth daily.   Yes Historical Provider, MD  folic acid (FOLVITE) A999333 MCG tablet Take 400 mcg by mouth daily.   Yes Historical Provider, MD  gabapentin (NEURONTIN) 100 MG capsule Take 100 mg by mouth at bedtime.    Yes Historical Provider, MD  guaiFENesin (MUCINEX) 600 MG 12 hr tablet Take 600 mg by mouth 2 (two) times daily. (Take every morning and at bedtime for cough)   Yes Historical Provider, MD  guaifenesin (ROBITUSSIN) 100  MG/5ML syrup Take 200 mg by mouth every 4 (four) hours as needed for cough.   Yes Historical Provider, MD  ipratropium-albuterol (DUONEB) 0.5-2.5 (3) MG/3ML SOLN Take 3 mLs by nebulization 3 (three) times daily. May inhale an additional  35mls 3 times a day as needed for chronic obstructive pulmonary disease   Yes Historical Provider, MD  lamoTRIgine (LAMICTAL) 25 MG tablet Take 50 mg by mouth at bedtime. Start on July 14th. 04/05/16  Yes Historical Provider, MD  latanoprost (XALATAN) 0.005 % ophthalmic solution Place 1 drop into both eyes at bedtime.   Yes Historical Provider, MD  LORazepam (ATIVAN) 0.5 MG tablet Take 1 tablet (0.5 mg total) by mouth every 4 (four) hours as needed for anxiety. Patient taking differently: Take 0.5 mg by mouth 2 (two) times daily. May take an additional 0.5mg  every 4 hours as needed for anxiety 05/22/16  Yes Dustin Flock, MD  magnesium oxide (MAG-OX) 400 MG tablet Take 1 tablet (400 mg total) by mouth every evening. 09/13/15  Yes Gladstone Lighter, MD  omeprazole (PRILOSEC) 20 MG capsule Take 20 mg by mouth daily.   Yes Historical Provider, MD  ondansetron (ZOFRAN) 8 MG tablet Take by mouth every 8 (eight) hours as needed for nausea.   Yes Historical Provider, MD  oxyCODONE (OXY IR/ROXICODONE) 5 MG immediate release tablet Take 1 tablet (5 mg total) by mouth every 6 (six) hours as needed for moderate pain or severe pain. 05/22/16  Yes Dustin Flock, MD  OXYGEN Inhale 2-3 L into the lungs as needed. For shortness of breath, respiratory distress or O2 sats <90.   Yes Historical Provider, MD  sevelamer carbonate (RENVELA) 800 MG tablet Take 800 mg by mouth 3 (three) times daily.    Yes Historical Provider, MD  tiotropium (SPIRIVA) 18 MCG inhalation capsule Place 1 capsule (18 mcg total) into inhaler and inhale daily. 09/13/15  Yes Gladstone Lighter, MD   Allergies  Allergen Reactions  . Penicillins Hives, Itching and Swelling    Has patient had a PCN reaction causing  immediate rash, facial/tongue/throat swelling, SOB or lightheadedness with hypotension: Yes Has patient had a PCN reaction causing severe rash involving mucus membranes or skin necrosis: No Has patient had a PCN reaction that required hospitalization No Has patient had a PCN reaction occurring within the last 10 years: No If all of the above answers are "NO", then may proceed with Cephalosporin use.    FAMILY HISTORY:  family history includes Stroke in his mother. SOCIAL HISTORY:  reports that he quit smoking about 15 months ago. He has a 14.00 pack-year smoking history. He has never used smokeless tobacco. He reports that he does not drink alcohol or use drugs.  REVIEW OF SYSTEMS:  Positives in BOLD Constitutional: Negative for fever, chills, weight loss, malaise/fatigue and diaphoresis.  HENT: Negative for hearing loss, ear pain, nosebleeds, congestion, sore throat, neck pain, tinnitus and ear discharge.   Eyes: Negative for blurred vision, double vision, photophobia, pain, discharge and redness.  Respiratory: Negative for cough, hemoptysis, sputum production, shortness of breath, wheezing and stridor.   Cardiovascular: Negative for chest pain, palpitations, orthopnea, claudication, leg swelling and PND.  Gastrointestinal: Negative for heartburn, nausea, vomiting, abdominal pain, diarrhea, constipation, blood in stool and melena.  Genitourinary: Negative for dysuria, urgency, frequency, hematuria and flank pain.  Musculoskeletal: Negative for myalgias, back pain, joint pain and falls.  Skin: Negative for itching and rash.  Neurological: Negative for dizziness, tingling, tremors, sensory change, speech change, focal weakness,  seizures, loss of consciousness, weakness and headaches.  Endo/Heme/Allergies: Negative for environmental allergies and polydipsia. Does not bruise/bleed easily.  SUBJECTIVE:  No complaints at this time pt resting comfortably in bed he states he is not having any  pain.  VITAL SIGNS: Temp:  [97.6 F (36.4 C)-97.7 F (36.5 C)] 97.6 F (36.4 C) (11/29 1744) Pulse Rate:  [98-113] 100 (11/29 1744) Resp:  [14-25] 21 (11/29 1800) BP: (66-127)/(46-78) 127/64 (11/29 1800) SpO2:  [90 %-100 %] 97 % (11/29 1715) Arterial Line BP: (45-121)/(17-59) 121/54 (11/29 1800) Weight:  [174 lb (78.9 kg)-176 lb 5.9 oz (80 kg)] 176 lb 5.9 oz (80 kg) (11/29 1744)  PHYSICAL EXAMINATION: General:  Well developed, well nourished AA male resting in bed Neuro:  Alert and oriented, follows commands, PERRLA HEENT:  Supple, no JVD Cardiovascular: NSR, s1s2, no M/R/G Lungs: diminished throughout, even, non labored Abdomen: +BS x4, soft, non tender, non distended Musculoskeletal:  Normal bulk and tone Skin: Stage III Pressure Ulcer left buttocks and right groin incision site dermabond in place   No results for input(s): NA, K, CL, CO2, BUN, CREATININE, GLUCOSE in the last 168 hours. No results for input(s): HGB, HCT, WBC, PLT in the last 168 hours. No results found.  ASSESSMENT / PLAN: Hypotension s/p right lower extremity angiogram  Stage III Pressure Ulcer Hx: Former Smoker and COPD P: Continue Neosynephrine gtt to maintain map >65 wean as tolerated Continuous telemetry monitoring Supplemental O2 as needed to maintain O2 sats 88% to 92% Continue prn bronchodilators Continue mucinex Heparin gtt per Vascular recommendations Wound Consult  I have personally obtained a history, examined the patient, evaluated Pertinent laboratory and RadioGraphic/imaging results, and  formulated the assessment and plan   The Patient requires high complexity decision making for assessment and support, frequent evaluation and titration of therapies.    Corrin Parker, M.D.  [facility]  Medical Director [place] Director [facility] Cardio-Pulmonary Department

## 2016-08-21 NOTE — Anesthesia Procedure Notes (Signed)
Procedure Name: LMA Insertion Date/Time: 08/21/2016 1:21 PM Performed by: Aline Brochure Pre-anesthesia Checklist: Patient identified, Emergency Drugs available, Suction available and Patient being monitored Patient Re-evaluated:Patient Re-evaluated prior to inductionOxygen Delivery Method: Circle system utilized Preoxygenation: Pre-oxygenation with 100% oxygen Intubation Type: IV induction Ventilation: Mask ventilation without difficulty LMA: LMA inserted LMA Size: 4.0 Number of attempts: 1 Placement Confirmation: positive ETCO2 Tube secured with: Tape Dental Injury: Teeth and Oropharynx as per pre-operative assessment

## 2016-08-21 NOTE — Progress Notes (Signed)
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 Penicillins Hives, Itching and Swelling    Has patient had a PCN reaction causing immediate rash, facial/tongue/throat swelling, SOB or lightheadedness with hypotension: Yes Has patient had a PCN reaction causing severe rash involving mucus membranes or skin necrosis: No Has patient had a PCN reaction that required hospitalization No Has patient had a PCN reaction occurring within the last 10 years: No If all of the above answers are "NO", then may proceed with Cephalosporin use.    Patient Measurements: Height: 5\' 10"  (177.8 cm) Weight: 174 lb (78.9 kg) IBW/kg (Calculated) : 73 Heparin Dosing Weight: 79 kg  Vital Signs: Temp: 97.6 F (36.4 C) (11/29 1550) Temp Source: Tympanic (11/29 1541) BP: 74/52 (11/29 1635) Pulse Rate: 113 (11/29 1635)  Labs: No results for input(s): HGB, HCT, PLT, APTT, LABPROT, INR, HEPARINUNFRC, HEPRLOWMOCWT, CREATININE, CKTOTAL, CKMB, TROPONINI in the last 72 hours.  CrCl cannot be calculated (Patient's most recent lab result is older than the maximum 21 days allowed.).  Medical History: Past Medical History:  Diagnosis Date  . AAA (abdominal aortic aneurysm) ([place])   . Allergic rhinitis   . Anemia    a. baseline hgb ~ 8  . Bladder cancer ([place])   . Chronic combined systolic and diastolic CHF (congestive heart failure) ([place])    a. echo 12/2014: EF 25-30%, anterior wall wall motion abnormalities; b. planned ischemic evaluation once patient is stable medically; c. echo 01/2015: EF 45-50%, no RWMA, mild AT, LA mildly dilated, mod pericardial effusion along LV free wall, no evidence of hemodynamic compromise  . Cognitive communication deficit   . COPD (chronic obstructive pulmonary disease) ([place])   .  Dementia   . Detrusor sphincter dyssynergia   . Dysrhythmia   . ESRD on hemodialysis ([place])    a. Tuesday, Thursday, and Saturdays  . GERD (gastroesophageal reflux disease)   . History of septic shock    a. 01/2015  . History of small bowel obstruction    a. 01/2015  . Hydronephrosis   . Hypercholesteremia   . Hypertension   . 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 Incontinence   . Incontinence of urine   . Magnesium deficiency   . Mini stroke ([place])   . Overactive bladder   . PAF (paroxysmal atrial fibrillation) ([place])    a. new onset 12/2014 in the setting of UTI, sepsis, hypotension, and anemia; b. not on long term anticoagulation given anemia; c. family aware of stroke risk, they are ok with this; d. on amiodarone   . Prostate cancer ([place])   . Stroke [facility])     Medications:  Prescriptions Prior to Admission  Medication Sig Dispense Refill Last Dose  . albuterol (PROVENTIL) (2.5 MG/3ML) 0.083% nebulizer solution Take 3 mLs (2.5 mg total) by nebulization every 4 (four) hours as needed for wheezing or shortness of breath. 75 mL 12 08/20/2016 at Unknown time  . Amino Acids-Protein Hydrolys (FEEDING SUPPLEMENT, PRO-STAT SUGAR FREE 64,) LIQD Take 30 mLs by mouth every evening.   08/20/2016 at Unknown time  . amiodarone (PACERONE) 200 MG tablet Take 1 tablet (200 mg total) by mouth daily. 30 tablet 5 08/20/2016 at Unknown time  . atorvastatin (LIPITOR) 40 MG tablet Take 40 mg by mouth at bedtime.   08/20/2016 at Unknown time  .  budesonide-formoterol (SYMBICORT) 160-4.5 MCG/ACT inhaler Inhale 2 puffs into the lungs 2 (two) times daily.   08/20/2016 at Unknown time  . carvedilol (COREG) 3.125 MG tablet Take 1 tablet (3.125 mg total) by mouth 2 (two) times daily with a meal. 60 tablet 2 08/20/2016 at Unknown time  . diclofenac sodium (VOLTAREN) 1 % GEL Apply 2 g topically 3 (three) times daily.    08/20/2016 at Unknown time  . ferrous sulfate 325 (65 FE) MG tablet Take 325 mg by mouth daily.   08/20/2016 at  Unknown time  . folic acid (FOLVITE) A999333 MCG tablet Take 400 mcg by mouth daily.   08/20/2016 at Unknown time  . gabapentin (NEURONTIN) 100 MG capsule Take 100 mg by mouth at bedtime.    08/20/2016 at Unknown time  . guaiFENesin (MUCINEX) 600 MG 12 hr tablet Take 600 mg by mouth 2 (two) times daily. (Take every morning and at bedtime for cough)   08/20/2016 at Unknown time  . guaifenesin (ROBITUSSIN) 100 MG/5ML syrup Take 200 mg by mouth every 4 (four) hours as needed for cough.   Past Week at Unknown time  . ipratropium-albuterol (DUONEB) 0.5-2.5 (3) MG/3ML SOLN Take 3 mLs by nebulization 3 (three) times daily. May inhale an additional  85mls 3 times a day as needed for chronic obstructive pulmonary disease   08/20/2016 at Unknown time  . lamoTRIgine (LAMICTAL) 25 MG tablet Take 50 mg by mouth at bedtime. Start on July 14th.   08/20/2016 at Unknown time  . latanoprost (XALATAN) 0.005 % ophthalmic solution Place 1 drop into both eyes at bedtime.   08/20/2016 at Unknown time  . LORazepam (ATIVAN) 0.5 MG tablet Take 1 tablet (0.5 mg total) by mouth every 4 (four) hours as needed for anxiety. (Patient taking differently: Take 0.5 mg by mouth 2 (two) times daily. May take an additional 0.5mg  every 4 hours as needed for anxiety) 30 tablet 0 08/20/2016 at Unknown time  . magnesium oxide (MAG-OX) 400 MG tablet Take 1 tablet (400 mg total) by mouth every evening. 30 tablet 2 08/20/2016 at Unknown time  . omeprazole (PRILOSEC) 20 MG capsule Take 20 mg by mouth daily.   08/20/2016 at Unknown time  . ondansetron (ZOFRAN) 8 MG tablet Take by mouth every 8 (eight) hours as needed for nausea.   08/20/2016 at Unknown time  . oxyCODONE (OXY IR/ROXICODONE) 5 MG immediate release tablet Take 1 tablet (5 mg total) by mouth every 6 (six) hours as needed for moderate pain or severe pain. 20 tablet 0 08/20/2016 at Unknown time  . OXYGEN Inhale 2-3 L into the lungs as needed. For shortness of breath, respiratory distress or O2  sats <90.   08/21/2016 at Unknown time  . sevelamer carbonate (RENVELA) 800 MG tablet Take 800 mg by mouth 3 (three) times daily.    08/20/2016 at Unknown time  . tiotropium (SPIRIVA) 18 MCG inhalation capsule Place 1 capsule (18 mcg total) into inhaler and inhale daily. 30 capsule 12 08/20/2016 at Unknown time  . [DISCONTINUED] acetaminophen (TYLENOL) 325 MG tablet Take 650 mg by mouth every 8 (eight) hours as needed. For general discomfort   Past Week at Unknown time   Scheduled:  . amiodarone  200 mg Oral Daily  . atorvastatin  40 mg Oral QHS  . carvedilol  3.125 mg Oral BID WC  . feeding supplement (PRO-STAT SUGAR FREE 64)  30 mL Oral QPM  . ferrous sulfate  325 mg Oral Daily  . folic  acid  400 mcg Oral Daily  . gabapentin  100 mg Oral QHS  . guaiFENesin  600 mg Oral BID  . heparin  2,350 Units Intravenous Once  . ipratropium-albuterol  3 mL Nebulization TID  . lamoTRIgine  50 mg Oral QHS  . latanoprost  1 drop Both Eyes QHS  . magnesium oxide  400 mg Oral QPM  . sevelamer carbonate  800 mg Oral TID  . tiotropium  18 mcg Inhalation Daily   Infusions:  . heparin    . 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   Baseline labs have been ordered STAT.  I discussed the case with vascular surgeon Dr. Delana Meyer - will give the patient an initial bolus of 30 units/kg and target a HL of 0.3 - 0.5 units/mL  Goal of Therapy:  Heparin level 0.3-0.5 units/ml Monitor platelets by anticoagulation protocol: Yes   Plan:  Give 2350 units bolus x 1 Start heparin infusion at 1000 units/hr Check anti-Xa level in 6 hours and daily while on heparin Continue to monitor H&H and platelets CBC ordered with AM labs tomorrow.  Lenis Noon, PharmD Clinical Pharmacist 08/21/2016,4:51 PM

## 2016-08-21 NOTE — Op Note (Signed)
[facility] Percutaneous Study/Intervention Procedural Note   Date of Surgery: 08/21/2016  Surgeon: Hortencia Pilar  Pre-operative Diagnosis: Popliteal artery aneurysm right lower extremity; Atherosclerotic occlusive disease bilateral lower extremities with rest pain bilateral lower extremity  Post-operative diagnosis: Same  Procedure(s) Performed: 1. Introduction catheter into right lower extremity 3rd order catheter placement with additional third order 2. Contrast injection right lower extremity for distal runoff  3. Percutaneous transluminal angioplasty and stent placement for exclusion of distal SFA and popliteal artery aneurysm with Viabahn stent grafts  4. Percutaneous transluminal angioplasty to 3 mm right posterior tibial artery  5. Percutaneous transluminal angioplasty to 2 mm right peroneal artery             6.  Mechanical thrombus extraction using the penumbra catheter right popliteal distally             7.  Pre-close closure right superficial femoral femoral arteriotomy  Anesthesia: Conscious sedation was administered under my direct supervision. IV Versed plus fentanyl were utilized. Continuous ECG, pulse oximetry and blood pressure was monitored throughout the entire procedure.  Conscious sedation was for a total of 1 hour 50 minutes.  Sheath: 12 French antegrade right superficial femoral artery  Contrast: 60 cc  Fluoroscopy Time: 8.9 minutes  Indications: [name] presents with increasing pain of the right lower extremity. 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 This suggests the patient is having limb threatening ischemia and given his popliteal artery aneurysm a high risk of limb loss. The risks and benefits are reviewed all questions answered patient agrees to proceed.  Procedure:[name] is a 75 y.o. y.o. male who was  identified and appropriate procedural time out was performed. The patient was then placed supine on the table and prepped and draped in the usual sterile fashion.   Ultrasound was placed in the sterile sleeve and the right proximal thighwas evaluated the right superficial femoral artery was echolucent and pulsatile indicating patency. Image was recorded for the permanent record and under real-time visualization a microneedle was inserted into the right superficial femoral artery microwire followed by a micro-sheath. A J-wire was then advanced through the micro-sheath and a 12 French sheath was then inserted over a J-wire. Hand injection of contrast was then used to perform a distal runoff.  5000 units of heparin was then given and allowed to circulate for 5 minutes.  Straight catheter and stiff angle Glidewire were then negotiated down into the distal popliteal. Catheter was then advanced. Hand injection contrast demonstrated the tibial anatomy in detail.  Having defined the proximal and distal endpoints for the aneurysm measurements were again made and beginning distally and 11 mm x 10 cm Viabahn stent was deployed subsequently 3 more Viabahn stents all 13 mm x 10 mm were deployed. The distal stent was then postdilated with a 10 mm balloon and the remaining portions more proximally were dilated with a 14 mm balloon. Inflations were 10/31/2008 atmospheres for 30 seconds. Follow-up imaging demonstrated patency of the stents with exclusion of the popliteal artery aneurysm. Attention was then turned to the tibial vessel disease.  Glidewire catheter were then negotiated into the posterior tibial artery where hand injection contrast was used to complete distal imaging. Subsequently a 3 mm x 6 mm ultraverse balloon was used to angioplasty the posterior tibial artery. Each inflation was for 1 minute at 10-12 atm. Follow-up imaging demonstrated a good result with less than 5% residual stenosis. Therefore the  catheter was reintroduced and the wire exchanged for a Glidewire and the wire catheter were negotiated into the peroneal. Hand injection contrast within the peroneal demonstrated the distal runoff area following this the wire was exchanged for the 018 wire and a 2 x 20 balloon was used to angioplasty the peroneal. Again the inflation was for 1 minute at 10-12 atm.  Follow-up imaging demonstrated excellent patency with preservation of the distal runoff.  During this procedure several doses of nitroglycerin were given intra-arterially to maximize the arterial outflow.  After review of these images the sheath is pulled And the pre-close device is secured with successful hemostasis. There no immediate Complications.  Findings: The right superficial femoral arteryis widely patent and shows evidence of aneurysmal deterioration which continues down into the popliteal. The distal popliteal appears smaller in size and although still large for the typical distal popliteal approaches a more normal caliber.  The distal popliteal demonstrates nonhemodynamically significant disease whereas the trifurcation is heavily diseased with occlusion of the anterior tibial just beyond the origin. The peroneal demonstrates a subtotal occlusion in its proximal portion and osterior tibial demonstrates a greater than 90% stenoses in its proximal one third. Distally the posterior tibial fills the medial and lateral plantar vessels as well as the pedal arch. The peroneal is patent down to the ankle but does not show arge collateral flow at this level  Following angioplasty peroneal and posterior  Tibial there is now  in-line flow and looks quite nice. Following stent placement for exclusion of the popliteal artery aneurysm there is successful exclusion of the aneurysm..  Summary: Successful recanalization distally in association with successful exclusion of a right popliteal arter lower cavity for limb  salvage   Disposition: Patient was taken to the recovery room in stable condition having tolerated the procedure well.  Belenda Cruise Roxanna Mcever 08/21/2016,5:31 PM

## 2016-08-21 NOTE — Anesthesia Preprocedure Evaluation (Signed)
Anesthesia Evaluation  Patient identified by MRN, date of birth, ID band Patient awake    Reviewed: Allergy & Precautions, H&P , NPO status , Patient's Chart, lab work & pertinent test results, reviewed documented beta blocker date and time   History of Anesthesia Complications Negative for: history of anesthetic complications  Airway Mallampati: III  TM Distance: >3 FB Neck ROM: limited    Dental  (+) Missing, Poor Dentition, Chipped   Pulmonary shortness of breath and with exertion, neg sleep apnea, pneumonia, COPD,  COPD inhaler and oxygen dependent, neg recent URI, former smoker,    breath sounds clear to auscultation + decreased breath sounds      Cardiovascular Exercise Tolerance: Poor hypertension, On Medications (-) angina+ CAD, + Peripheral Vascular Disease and +CHF  (-) Past MI, (-) Cardiac Stents and (-) CABG + dysrhythmias Atrial Fibrillation (-) Valvular Problems/Murmurs     Neuro/Psych neg Seizures PSYCHIATRIC DISORDERS (Dementia) TIA   GI/Hepatic Neg liver ROS, GERD  ,  Endo/Other  diabetes  Renal/GU ESRF and DialysisRenal disease  negative genitourinary   Musculoskeletal   Abdominal   Peds  Hematology  (+) Blood dyscrasia, anemia ,   Anesthesia Other Findings Past Medical History:   PAF (paroxysmal atrial fibrillation) (HCC)                     Comment:a. new onset 12/2014 in the setting of UTI,               sepsis, hypotension, and anemia; b. not on long              term anticoagulation given anemia; c. family               aware of stroke risk, they are ok with this; d.              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 echo 12/2014: EF 25-30%, anterior wall wall               motion abnormalities; b. planned ischemic               evaluation once patient is stable medically; c.              echo 01/2015: EF 45-50%, no RWMA, mild AT, LA         mildly dilated, mod pericardial effusion along               LV free wall, no evidence of hemodynamic               compromise   ESRD on hemodialysis ([place])                                     Comment:a. Tuesday, Thursday, and Saturdays   Anemia                                                         Comment:a. baseline hgb ~ 8   History of small bowel obstruction  Comment:a. 01/2015   History of septic shock                                        Comment:a. 01/2015   GERD (gastroesophageal reflux disease)                       Allergic rhinitis                                            COPD (chronic obstructive pulmonary disease) (*              Hypercholesteremia                                           Cognitive communication deficit                              Magnesium deficiency                                         Dementia                                                     Iliac aneurysm (HCC)                                         Incontinence                                                 Hydronephrosis                                               Overactive bladder                                           Detrusor sphincter dyssynergia                               Mini stroke ([place])                                            Bladder cancer ([place])  Prostate cancer [facility])                                        Renal insufficiency                                          Hypertension                                                 Laryngeal polyps  Reproductive/Obstetrics negative OB ROS                             Anesthesia Physical  Anesthesia Plan  ASA: IV  Anesthesia Plan: General   Post-op Pain Management:    Induction:   Airway Management Planned:   Additional Equipment:   Intra-op Plan:   Post-operative Plan:   Informed Consent: I have reviewed the  patients History and Physical, chart, labs and discussed the procedure including the risks, benefits and alternatives for the proposed anesthesia with the patient or authorized representative who has indicated his/her understanding and acceptance.   Dental Advisory Given  Plan Discussed with: Anesthesiologist, CRNA and Surgeon  Anesthesia Plan Comments:         Anesthesia Quick Evaluation

## 2016-08-21 NOTE — Progress Notes (Signed)
I paged the on call ICU staff but have not received a call back

## 2016-08-22 DIAGNOSIS — I724 Aneurysm of artery of lower extremity: Secondary | ICD-10-CM

## 2016-08-22 DIAGNOSIS — L8990 Pressure ulcer of unspecified site, unspecified stage: Secondary | ICD-10-CM | POA: Insufficient documentation

## 2016-08-22 DIAGNOSIS — I70221 Atherosclerosis of native arteries of extremities with rest pain, right leg: Secondary | ICD-10-CM

## 2016-08-22 LAB — CBC
HCT: 32.5 % — ABNORMAL LOW (ref 40.0–52.0)
Hemoglobin: 10.3 g/dL — ABNORMAL LOW (ref 13.0–18.0)
MCH: 25.7 pg — ABNORMAL LOW (ref 26.0–34.0)
MCHC: 31.6 g/dL — ABNORMAL LOW (ref 32.0–36.0)
MCV: 81.1 fL (ref 80.0–100.0)
PLATELETS: 201 10*3/uL (ref 150–440)
RBC: 4.01 MIL/uL — AB (ref 4.40–5.90)
RDW: 18.5 % — AB (ref 11.5–14.5)
WBC: 8.5 10*3/uL (ref 3.8–10.6)

## 2016-08-22 LAB — HEPARIN LEVEL (UNFRACTIONATED)
HEPARIN UNFRACTIONATED: 3.58 [IU]/mL — AB (ref 0.30–0.70)
Heparin Unfractionated: 0.17 IU/mL — ABNORMAL LOW (ref 0.30–0.70)

## 2016-08-22 LAB — GLUCOSE, CAPILLARY
GLUCOSE-CAPILLARY: 139 mg/dL — AB (ref 65–99)
Glucose-Capillary: 134 mg/dL — ABNORMAL HIGH (ref 65–99)
Glucose-Capillary: 132 mg/dL — ABNORMAL HIGH (ref 65–99)

## 2016-08-22 MED ORDER — ORAL CARE MOUTH RINSE
15.0000 mL | Freq: Two times a day (BID) | OROMUCOSAL | Status: DC
  Administered 2016-08-22 – 2016-08-28 (×10): 15 mL via OROMUCOSAL

## 2016-08-22 MED ORDER — ALTEPLASE 2 MG IJ SOLR: 2.0000 mg | Freq: Once | INTRAMUSCULAR | Status: DC | PRN

## 2016-08-22 MED ORDER — LIDOCAINE-PRILOCAINE 2.5-2.5 % EX CREA
1.0000 "application " | TOPICAL_CREAM | CUTANEOUS | Status: DC | PRN
  Filled 2016-08-22: qty 5

## 2016-08-22 MED ORDER — SODIUM CHLORIDE 0.9 % IV SOLN: 100.0000 mL | INTRAVENOUS | Status: DC | PRN

## 2016-08-22 MED ORDER — WARFARIN SODIUM 5 MG PO TABS: 7.5000 mg | ORAL_TABLET | Freq: Every day | ORAL | Status: DC

## 2016-08-22 MED ORDER — HEPARIN SODIUM (PORCINE) 1000 UNIT/ML DIALYSIS
1000.0000 [IU] | INTRAMUSCULAR | Status: DC | PRN
  Filled 2016-08-22: qty 1

## 2016-08-22 MED ORDER — LIDOCAINE HCL (PF) 1 % IJ SOLN
5.0000 mL | INTRAMUSCULAR | Status: DC | PRN
  Filled 2016-08-22: qty 5

## 2016-08-22 MED ORDER — HEPARIN BOLUS VIA INFUSION
2400.0000 [IU] | Freq: Once | INTRAVENOUS | Status: AC
  Administered 2016-08-22: 2400 [IU] via INTRAVENOUS
  Filled 2016-08-22: qty 2400

## 2016-08-22 MED ORDER — PENTAFLUOROPROP-TETRAFLUOROETH EX AERO
1.0000 "application " | INHALATION_SPRAY | CUTANEOUS | Status: DC | PRN
  Filled 2016-08-22: qty 30

## 2016-08-22 MED ORDER — HEPARIN BOLUS VIA INFUSION
2500.0000 [IU] | Freq: Once | INTRAVENOUS | Status: AC
  Administered 2016-08-22: 2500 [IU] via INTRAVENOUS
  Filled 2016-08-22: qty 2500

## 2016-08-22 MED ORDER — WARFARIN - PHYSICIAN DOSING INPATIENT
Freq: Every day | Status: DC
  Administered 2016-08-24 – 2016-08-25 (×2)

## 2016-08-22 NOTE — Progress Notes (Signed)
HD completed without issue. Patient tolerated well. Early drop in bp led to reduction of UF goal, however 1L was able to be removed. Pt currently has no complaints.

## 2016-08-22 NOTE — Progress Notes (Signed)
Chaplain rounded the unit to provide a compassionate presence and support to the patient. [name] [phone]

## 2016-08-22 NOTE — Progress Notes (Signed)
[facility]  ROUNDING NOTE   Subjective:  Patient presented yesterday with large right popliteal artery aneurysm. During an angiogram yesterday the patient became hypotensive. He was subsequently moved to the critical care unit. He is currently on a heparin drip. Patient is due for hemodialysis today.   Objective:  Vital signs in last 24 hours:  Temp:  [97.5 F (36.4 C)-98.4 F (36.9 C)] 97.8 F (36.6 C) (11/30 1200) Pulse Rate:  [94-113] 98 (11/30 1500) Resp:  [15-28] 18 (11/30 1500) BP: (66-127)/(46-80) 103/53 (11/30 1500) SpO2:  [89 %-100 %] 98 % (11/30 1500) Arterial Line BP: (45-145)/(17-70) 117/52 (11/30 1500) Weight:  [80 kg (176 lb 5.9 oz)] 80 kg (176 lb 5.9 oz) (11/29 1744)  Weight change:  Filed Weights   08/21/16 1117 08/21/16 1744  Weight: 78.9 kg (174 lb) 80 kg (176 lb 5.9 oz)    Intake/Output: I/O last 3 completed shifts: In: 1091.5 [I.V.:1091.5] Out: 50 [Blood:50]   Intake/Output this shift:  Total I/O In: 267.3 [P.O.:145; I.V.:122.3] Out: -   Physical Exam: General: No acute distress  Head: Normocephalic, atraumatic. Moist oral mucosal membranes  Eyes: Anicteric  Neck: Supple, trachea midline  Lungs:  Scattered rhonchi, normal effort  Heart: S1S2 irregular  Abdomen:  Soft, nontender, bowel sounds present   Extremities: trace peripheral edema.  Neurologic: Nonfocal, moving all four extremities  Skin: No lesions  Access: LUE AVF    Basic Metabolic Panel:  Recent Labs Lab 08/21/16 2015  CREATININE 5.85*    Liver Function Tests: No results for input(s): AST, ALT, ALKPHOS, BILITOT, PROT, ALBUMIN in the last 168 hours. No results for input(s): LIPASE, AMYLASE in the last 168 hours. No results for input(s): AMMONIA in the last 168 hours.  CBC:  Recent Labs Lab 08/21/16 1820 08/22/16 0956  WBC 17.2* 8.5  HGB 10.7* 10.3*  HCT 34.5* 32.5*  MCV 82.6 81.1  PLT 287 201    Cardiac Enzymes: No results for input(s):  CKTOTAL, CKMB, CKMBINDEX, TROPONINI in the last 168 hours.  BNP: Invalid input(s): POCBNP  CBG:  Recent Labs Lab 08/21/16 1749 08/22/16 0742 08/22/16 1129  GLUCAP 80 139* 134*    Microbiology: Results for orders placed or performed during the hospital encounter of 08/21/16  MRSA PCR Screening     Status: None   Collection Time: 08/21/16 11:00 AM  Result Value Ref Range Status   MRSA by PCR NEGATIVE NEGATIVE Final    Comment:        The GeneXpert MRSA Assay (FDA approved for NASAL specimens only), is one component of a comprehensive MRSA colonization surveillance program. It is not intended to diagnose MRSA infection nor to guide or monitor treatment for MRSA infections.     Coagulation Studies:  Recent Labs  08/21/16 1820  LABPROT 16.6*  INR 1.33    Urinalysis: No results for input(s): COLORURINE, LABSPEC, PHURINE, GLUCOSEU, HGBUR, BILIRUBINUR, KETONESUR, PROTEINUR, UROBILINOGEN, NITRITE, LEUKOCYTESUR in the last 72 hours.  Invalid input(s): APPERANCEUR    Imaging: No results found.   Medications:   . heparin 1,500 Units/hr (08/22/16 1500)  . phenylephrine (NEO-SYNEPHRINE) Adult infusion Stopped (08/22/16 0701)   . amiodarone  200 mg Oral Daily  . atorvastatin  40 mg Oral QHS  . carvedilol  3.125 mg Oral BID WC  . docusate sodium  100 mg Oral Daily  . feeding supplement (PRO-STAT SUGAR FREE 64)  30 mL Oral QPM  . ferrous sulfate  325 mg Oral Daily  . folic acid  500 mcg Oral Daily  . gabapentin  100 mg Oral QHS  . guaiFENesin  600 mg Oral BID  . ipratropium-albuterol  3 mL Nebulization TID  . lamoTRIgine  50 mg Oral QHS  . latanoprost  1 drop Both Eyes QHS  . magnesium oxide  400 mg Oral QPM  . mouth rinse  15 mL Mouth Rinse BID  . pantoprazole  40 mg Oral Daily  . sevelamer carbonate  800 mg Oral TID WC  . tiotropium  18 mcg Inhalation Daily   sodium chloride, sodium chloride, acetaminophen **OR** acetaminophen, albuterol, alteplase, alum &  mag hydroxide-simeth, guaifenesin, heparin, HYDROmorphone (DILAUDID) injection, lidocaine (PF), lidocaine-prilocaine, ondansetron, oxyCODONE, pentafluoroprop-tetrafluoroeth  Assessment/ Plan:  75 y.o. 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  ESRD on HD TTS;Admitted for angiogram of the right lower extremity with history of large popliteal aneurysm. -Patient due for hemodialysis today.  Orders have been prepared.   2.  Anemia chronic kidney disease.  Hemoglobin currently 10.3.  Start the patient on Epogen 10,000 units IV with dialysis.  3.  Secondary hyperparathyroidism.  Continue Renvela 800 mg by mouth 3 times a day with meals.  4.  Right popliteal artery aneurysm.  Status post angiogram.  Vascular surgery following closely.     LOS: 1 Elizabth Palka 11/30/20173:54 PM

## 2016-08-22 NOTE — Progress Notes (Signed)
Dialysis initiated without issue. Pt resting comfortably. No complaints.

## 2016-08-22 NOTE — Consult Note (Signed)
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  Apply silicone border foam .  Change every 3 days and PRN soilage.  Will not follow at this time.  Please re-consult if needed.  Domenic Moras RN BSN [place] Pager [phone]

## 2016-08-22 NOTE — Progress Notes (Signed)
Patient transferred to hemodialysis. Report given to 2A Nurse [place], Rn. All questions answered. Patient will be going to room 250 post dialysis.

## 2016-08-22 NOTE — Progress Notes (Signed)
Pre HD assessment  

## 2016-08-22 NOTE — Anesthesia Postprocedure Evaluation (Signed)
Anesthesia Post Note  Patient: [name]  Procedure(s) Performed: Procedure(s) (LRB): LOWER EXTREMITY ANGIOGRAPHY (Right) Lower Extremity Intervention  Patient location during evaluation: ICU Anesthesia Type: General Level of consciousness: awake and alert Pain management: pain level controlled Vital Signs Assessment: post-procedure vital signs reviewed and stable Respiratory status: spontaneous breathing, nonlabored ventilation, respiratory function stable and patient connected to nasal cannula oxygen Cardiovascular status: blood pressure returned to baseline and stable Postop Assessment: no signs of nausea or vomiting Anesthetic complications: no    Last Vitals:  Vitals:   08/22/16 0500 08/22/16 0600  BP: 96/66 101/74  Pulse:  95  Resp: (!) 28 15  Temp: 36.9 C     Last Pain:  Vitals:   08/22/16 0500  TempSrc: Oral  PainSc:                  Alison Stalling

## 2016-08-22 NOTE — Progress Notes (Signed)
Pre Dialysis 

## 2016-08-22 NOTE — Progress Notes (Signed)
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 Penicillins Hives, Itching and Swelling    Has patient had a PCN reaction causing immediate rash, facial/tongue/throat swelling, SOB or lightheadedness with hypotension: Yes Has patient had a PCN reaction causing severe rash involving mucus membranes or skin necrosis: No Has patient had a PCN reaction that required hospitalization No Has patient had a PCN reaction occurring within the last 10 years: No If all of the above answers are "NO", then may proceed with Cephalosporin use.    Patient Measurements: Height: 6\' 2"  (188 cm) Weight: 176 lb 5.9 oz (80 kg) IBW/kg (Calculated) : 82.2 Heparin Dosing Weight: 80 kg  Vital Signs: Temp: 97.5 F (36.4 C) (11/30 0800) Temp Source: Axillary (11/30 0800) BP: 100/73 (11/30 0915) Pulse Rate: 97 (11/30 0915)  Labs:  Recent Labs  08/21/16 1820 08/21/16 2015 08/22/16 0111 08/22/16 0956  HGB 10.7*  --   --  10.3*  HCT 34.5*  --   --  32.5*  PLT 287  --   --  201  APTT 37*  --   --   --   LABPROT 16.6*  --   --   --   INR 1.33  --   --   --   HEPARINUNFRC  --   --  <0.10* 0.17*  CREATININE  --  5.85*  --   --     Estimated Creatinine Clearance: 12.5 mL/min (by C-G formula based on SCr of 5.85 mg/dL (H)).  Medical History: Past Medical History:  Diagnosis Date  . AAA (abdominal aortic aneurysm) ([place])   . Allergic rhinitis   . Anemia    a. baseline hgb ~ 8  . Bladder cancer ([place])   . Chronic combined systolic and diastolic CHF (congestive heart failure) ([place])    a. echo 12/2014: EF 25-30%, anterior wall wall motion abnormalities; b. planned ischemic evaluation once patient is stable medically; c. echo 01/2015: EF 45-50%, no RWMA, mild AT, LA mildly dilated, mod pericardial effusion along LV free wall, no evidence of  hemodynamic compromise  . Cognitive communication deficit   . COPD (chronic obstructive pulmonary disease) ([place])   . Dementia   . Detrusor sphincter dyssynergia   . Dysrhythmia   . ESRD on hemodialysis ([place])    a. Tuesday, Thursday, and Saturdays  . GERD (gastroesophageal reflux disease)   . History of septic shock    a. 01/2015  . History of small bowel obstruction    a. 01/2015  . Hydronephrosis   . Hypercholesteremia   . Hypertension   . 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 Incontinence   . Incontinence of urine   . Magnesium deficiency   . Mini stroke ([place])   . Overactive bladder   . PAF (paroxysmal atrial fibrillation) ([place])    a. new onset 12/2014 in the setting of UTI, sepsis, hypotension, and anemia; b. not on long term anticoagulation given anemia; c. family aware of stroke risk, they are ok with this; d. on amiodarone   . Prostate cancer ([place])   . Stroke [facility])     Medications:  Prescriptions Prior to Admission  Medication Sig Dispense Refill Last Dose  . albuterol (PROVENTIL) (2.5 MG/3ML) 0.083% nebulizer solution Take 3 mLs (2.5 mg total) by nebulization every 4 (four) hours as needed for wheezing or shortness of breath. 75 mL  12 08/20/2016 at Unknown time  . Amino Acids-Protein Hydrolys (FEEDING SUPPLEMENT, PRO-STAT SUGAR FREE 64,) LIQD Take 30 mLs by mouth every evening.   08/20/2016 at Unknown time  . amiodarone (PACERONE) 200 MG tablet Take 1 tablet (200 mg total) by mouth daily. 30 tablet 5 08/20/2016 at Unknown time  . atorvastatin (LIPITOR) 40 MG tablet Take 40 mg by mouth at bedtime.   08/20/2016 at Unknown time  . budesonide-formoterol (SYMBICORT) 160-4.5 MCG/ACT inhaler Inhale 2 puffs into the lungs 2 (two) times daily.   08/20/2016 at Unknown time  . carvedilol (COREG) 3.125 MG tablet Take 1 tablet (3.125 mg total) by mouth 2 (two) times daily with a meal. 60 tablet 2 08/20/2016 at Unknown time  . diclofenac sodium (VOLTAREN) 1 % GEL Apply 2 g topically 3 (three) times  daily.    08/20/2016 at Unknown time  . ferrous sulfate 325 (65 FE) MG tablet Take 325 mg by mouth daily.   08/20/2016 at Unknown time  . folic acid (FOLVITE) A999333 MCG tablet Take 400 mcg by mouth daily.   08/20/2016 at Unknown time  . gabapentin (NEURONTIN) 100 MG capsule Take 100 mg by mouth at bedtime.    08/20/2016 at Unknown time  . guaiFENesin (MUCINEX) 600 MG 12 hr tablet Take 600 mg by mouth 2 (two) times daily. (Take every morning and at bedtime for cough)   08/20/2016 at Unknown time  . guaifenesin (ROBITUSSIN) 100 MG/5ML syrup Take 200 mg by mouth every 4 (four) hours as needed for cough.   Past Week at Unknown time  . ipratropium-albuterol (DUONEB) 0.5-2.5 (3) MG/3ML SOLN Take 3 mLs by nebulization 3 (three) times daily. May inhale an additional  18mls 3 times a day as needed for chronic obstructive pulmonary disease   08/20/2016 at Unknown time  . lamoTRIgine (LAMICTAL) 25 MG tablet Take 50 mg by mouth at bedtime. Start on July 14th.   08/20/2016 at Unknown time  . latanoprost (XALATAN) 0.005 % ophthalmic solution Place 1 drop into both eyes at bedtime.   08/20/2016 at Unknown time  . LORazepam (ATIVAN) 0.5 MG tablet Take 1 tablet (0.5 mg total) by mouth every 4 (four) hours as needed for anxiety. (Patient taking differently: Take 0.5 mg by mouth 2 (two) times daily. May take an additional 0.5mg  every 4 hours as needed for anxiety) 30 tablet 0 08/20/2016 at Unknown time  . magnesium oxide (MAG-OX) 400 MG tablet Take 1 tablet (400 mg total) by mouth every evening. 30 tablet 2 08/20/2016 at Unknown time  . omeprazole (PRILOSEC) 20 MG capsule Take 20 mg by mouth daily.   08/20/2016 at Unknown time  . ondansetron (ZOFRAN) 8 MG tablet Take by mouth every 8 (eight) hours as needed for nausea.   08/20/2016 at Unknown time  . oxyCODONE (OXY IR/ROXICODONE) 5 MG immediate release tablet Take 1 tablet (5 mg total) by mouth every 6 (six) hours as needed for moderate pain or severe pain. 20 tablet 0  08/20/2016 at Unknown time  . OXYGEN Inhale 2-3 L into the lungs as needed. For shortness of breath, respiratory distress or O2 sats <90.   08/21/2016 at Unknown time  . sevelamer carbonate (RENVELA) 800 MG tablet Take 800 mg by mouth 3 (three) times daily.    08/20/2016 at Unknown time  . tiotropium (SPIRIVA) 18 MCG inhalation capsule Place 1 capsule (18 mcg total) into inhaler and inhale daily. 30 capsule 12 08/20/2016 at Unknown time  . [DISCONTINUED] acetaminophen (TYLENOL) 325 MG  tablet Take 650 mg by mouth every 8 (eight) hours as needed. For general discomfort   Past Week at Unknown time   Scheduled:  . amiodarone  200 mg Oral Daily  . atorvastatin  40 mg Oral QHS  . carvedilol  3.125 mg Oral BID WC  . docusate sodium  100 mg Oral Daily  . feeding supplement (PRO-STAT SUGAR FREE 64)  30 mL Oral QPM  . ferrous sulfate  325 mg Oral Daily  . folic acid  [id number] mcg Oral Daily  . gabapentin  100 mg Oral QHS  . guaiFENesin  600 mg Oral BID  . heparin  2,500 Units Intravenous Once  . ipratropium-albuterol  3 mL Nebulization TID  . lamoTRIgine  50 mg Oral QHS  . latanoprost  1 drop Both Eyes QHS  . magnesium oxide  400 mg Oral QPM  . pantoprazole  40 mg Oral Daily  . sevelamer carbonate  800 mg Oral TID WC  . tiotropium  18 mcg Inhalation Daily   Infusions:  . heparin 1,300 Units/hr (08/22/16 1045)  . 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   Baseline labs have been ordered STAT.  11/29 Case was discussed with vascular surgeon Dr. Delana Meyer - gave the patient an initial bolus of 30 units/kg and target a HL of 0.3 - 0.5 units/mL  11/30 01:00 heparin level <0.1. 2400 unit bolus and increase rate to 1300 units/hr. Recheck in 8 hours.  Goal of Therapy:  Heparin level 0.3-0.5 units/ml Monitor  platelets by anticoagulation protocol: Yes   Plan:  11/30 1000 anti-Xa = 0.17 subtherapeutic. Will give 2500 U bolus and increase rate to 1500 u/hr. Spoke with RN, rate was correct and no disruptions in drip. Will check level again in 8 hours.  Darrow Bussing, PharmD Pharmacy Resident 08/22/2016 11:53 AM

## 2016-08-22 NOTE — Progress Notes (Signed)
Hemodialysis- Dr. Holley Raring notified of low bp.  OK to adjust goal to keep even and keep sbp>80.  Continue to monitor patient.

## 2016-08-22 NOTE — Progress Notes (Addendum)
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 Penicillins Hives, Itching and Swelling    Has patient had a PCN reaction causing immediate rash, facial/tongue/throat swelling, SOB or lightheadedness with hypotension: Yes Has patient had a PCN reaction causing severe rash involving mucus membranes or skin necrosis: No Has patient had a PCN reaction that required hospitalization No Has patient had a PCN reaction occurring within the last 10 years: No If all of the above answers are "NO", then may proceed with Cephalosporin use.    Patient Measurements: Height: 6\' 2"  (188 cm) Weight: 177 lb 11.1 oz (80.6 kg) IBW/kg (Calculated) : 82.2 Heparin Dosing Weight: 80 kg  Vital Signs: Temp: 98.2 F (36.8 C) (11/30 2024) Temp Source: Oral (11/30 2024) BP: 91/65 (11/30 2024) Pulse Rate: 105 (11/30 2024)  Labs:  Recent Labs  08/21/16 1820 08/21/16 2015 08/22/16 0111 08/22/16 0956 08/22/16 2040  HGB 10.7*  --   --  10.3*  --   HCT 34.5*  --   --  32.5*  --   PLT 287  --   --  201  --   APTT 37*  --   --   --   --   LABPROT 16.6*  --   --   --   --   INR 1.33  --   --   --   --   HEPARINUNFRC  --   --  <0.10* 0.17* 3.58*  CREATININE  --  5.85*  --   --   --     Estimated Creatinine Clearance: 12.6 mL/min (by C-G formula based on SCr of 5.85 mg/dL (H)).  Medical History: Past Medical History:  Diagnosis Date  . AAA (abdominal aortic aneurysm) ([place])   . Allergic rhinitis   . Anemia    a. baseline hgb ~ 8  . Bladder cancer ([place])   . Chronic combined systolic and diastolic CHF (congestive heart failure) ([place])    a. echo 12/2014: EF 25-30%, anterior wall wall motion abnormalities; b. planned ischemic evaluation once patient is stable medically; c. echo 01/2015: EF 45-50%, no RWMA, mild AT, LA mildly dilated, mod  pericardial effusion along LV free wall, no evidence of hemodynamic compromise  . Cognitive communication deficit   . COPD (chronic obstructive pulmonary disease) ([place])   . Dementia   . Detrusor sphincter dyssynergia   . Dysrhythmia   . ESRD on hemodialysis ([place])    a. Tuesday, Thursday, and Saturdays  . GERD (gastroesophageal reflux disease)   . History of septic shock    a. 01/2015  . History of small bowel obstruction    a. 01/2015  . Hydronephrosis   . Hypercholesteremia   . Hypertension   . 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 Incontinence   . Incontinence of urine   . Magnesium deficiency   . Mini stroke ([place])   . Overactive bladder   . PAF (paroxysmal atrial fibrillation) ([place])    a. new onset 12/2014 in the setting of UTI, sepsis, hypotension, and anemia; b. not on long term anticoagulation given anemia; c. family aware of stroke risk, they are ok with this; d. on amiodarone   . Prostate cancer ([place])   . Stroke [facility])     Medications:  Prescriptions Prior to Admission  Medication Sig Dispense Refill Last Dose  . albuterol (PROVENTIL) (2.5 MG/3ML) 0.083%  nebulizer solution Take 3 mLs (2.5 mg total) by nebulization every 4 (four) hours as needed for wheezing or shortness of breath. 75 mL 12 08/20/2016 at Unknown time  . Amino Acids-Protein Hydrolys (FEEDING SUPPLEMENT, PRO-STAT SUGAR FREE 64,) LIQD Take 30 mLs by mouth every evening.   08/20/2016 at Unknown time  . amiodarone (PACERONE) 200 MG tablet Take 1 tablet (200 mg total) by mouth daily. 30 tablet 5 08/20/2016 at Unknown time  . atorvastatin (LIPITOR) 40 MG tablet Take 40 mg by mouth at bedtime.   08/20/2016 at Unknown time  . budesonide-formoterol (SYMBICORT) 160-4.5 MCG/ACT inhaler Inhale 2 puffs into the lungs 2 (two) times daily.   08/20/2016 at Unknown time  . carvedilol (COREG) 3.125 MG tablet Take 1 tablet (3.125 mg total) by mouth 2 (two) times daily with a meal. 60 tablet 2 08/20/2016 at Unknown time  . diclofenac sodium  (VOLTAREN) 1 % GEL Apply 2 g topically 3 (three) times daily.    08/20/2016 at Unknown time  . ferrous sulfate 325 (65 FE) MG tablet Take 325 mg by mouth daily.   08/20/2016 at Unknown time  . folic acid (FOLVITE) A999333 MCG tablet Take 400 mcg by mouth daily.   08/20/2016 at Unknown time  . gabapentin (NEURONTIN) 100 MG capsule Take 100 mg by mouth at bedtime.    08/20/2016 at Unknown time  . guaiFENesin (MUCINEX) 600 MG 12 hr tablet Take 600 mg by mouth 2 (two) times daily. (Take every morning and at bedtime for cough)   08/20/2016 at Unknown time  . guaifenesin (ROBITUSSIN) 100 MG/5ML syrup Take 200 mg by mouth every 4 (four) hours as needed for cough.   Past Week at Unknown time  . ipratropium-albuterol (DUONEB) 0.5-2.5 (3) MG/3ML SOLN Take 3 mLs by nebulization 3 (three) times daily. May inhale an additional  40mls 3 times a day as needed for chronic obstructive pulmonary disease   08/20/2016 at Unknown time  . lamoTRIgine (LAMICTAL) 25 MG tablet Take 50 mg by mouth at bedtime. Start on July 14th.   08/20/2016 at Unknown time  . latanoprost (XALATAN) 0.005 % ophthalmic solution Place 1 drop into both eyes at bedtime.   08/20/2016 at Unknown time  . LORazepam (ATIVAN) 0.5 MG tablet Take 1 tablet (0.5 mg total) by mouth every 4 (four) hours as needed for anxiety. (Patient taking differently: Take 0.5 mg by mouth 2 (two) times daily. May take an additional 0.5mg  every 4 hours as needed for anxiety) 30 tablet 0 08/20/2016 at Unknown time  . magnesium oxide (MAG-OX) 400 MG tablet Take 1 tablet (400 mg total) by mouth every evening. 30 tablet 2 08/20/2016 at Unknown time  . omeprazole (PRILOSEC) 20 MG capsule Take 20 mg by mouth daily.   08/20/2016 at Unknown time  . ondansetron (ZOFRAN) 8 MG tablet Take by mouth every 8 (eight) hours as needed for nausea.   08/20/2016 at Unknown time  . oxyCODONE (OXY IR/ROXICODONE) 5 MG immediate release tablet Take 1 tablet (5 mg total) by mouth every 6 (six) hours as  needed for moderate pain or severe pain. 20 tablet 0 08/20/2016 at Unknown time  . OXYGEN Inhale 2-3 L into the lungs as needed. For shortness of breath, respiratory distress or O2 sats <90.   08/21/2016 at Unknown time  . sevelamer carbonate (RENVELA) 800 MG tablet Take 800 mg by mouth 3 (three) times daily.    08/20/2016 at Unknown time  . tiotropium (SPIRIVA) 18 MCG inhalation capsule Place  1 capsule (18 mcg total) into inhaler and inhale daily. 30 capsule 12 08/20/2016 at Unknown time  . [DISCONTINUED] acetaminophen (TYLENOL) 325 MG tablet Take 650 mg by mouth every 8 (eight) hours as needed. For general discomfort   Past Week at Unknown time   Scheduled:  . amiodarone  200 mg Oral Daily  . atorvastatin  40 mg Oral QHS  . carvedilol  3.125 mg Oral BID WC  . docusate sodium  100 mg Oral Daily  . feeding supplement (PRO-STAT SUGAR FREE 64)  30 mL Oral QPM  . ferrous sulfate  325 mg Oral Daily  . folic acid  [id number] mcg Oral Daily  . gabapentin  100 mg Oral QHS  . guaiFENesin  600 mg Oral BID  . ipratropium-albuterol  3 mL Nebulization TID  . lamoTRIgine  50 mg Oral QHS  . latanoprost  1 drop Both Eyes QHS  . magnesium oxide  400 mg Oral QPM  . mouth rinse  15 mL Mouth Rinse BID  . pantoprazole  40 mg Oral Daily  . sevelamer carbonate  800 mg Oral TID WC  . tiotropium  18 mcg Inhalation Daily  . warfarin  7.5 mg Oral q1800  . Warfarin - Physician Dosing Inpatient   Does not apply q1800   Infusions:  . heparin 1,500 Units/hr (08/22/16 1500)  . 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   Baseline labs have been ordered STAT.  11/29 Case was discussed with vascular surgeon Dr. Delana Meyer - gave the patient an initial bolus of 30 units/kg and target a HL of 0.3 - 0.5 units/mL  11/30 01:00 heparin level  <0.1. 2400 unit bolus and increase rate to 1300 units/hr. Recheck in 8 hours.  Goal of Therapy:  Heparin level 0.3-0.5 units/ml Monitor platelets by anticoagulation protocol: Yes   Plan:  11/30 1000 anti-Xa = 0.17 subtherapeutic. Will give 2500 U bolus and increase rate to 1500 u/hr. Spoke with RN, rate was correct and no disruptions in drip. Will check level again in 8 hours.  11/30 20:30 heparin level 3.58. Will redraw to confirm .  11/30 23:30 redraw HL 0.27. 1200 unit bolus and increase rate to 1650 units/hr. Recheck in 8 hours.  Darrow Bussing, PharmD Pharmacy Resident 08/22/2016 11:59 PM

## 2016-08-22 NOTE — Progress Notes (Signed)
[facility]  Daily Progress Note   Subjective  - 1 Day Post-Op  Patient is awake and alert. He states his right foot feels much better and is asking when we can fix his left leg  Objective Vitals:   08/22/16 1657 08/22/16 1700 08/22/16 1711 08/22/16 1719  BP: (!) 70/55 (!) 86/52 (!) 86/61 95/60  Pulse: 83 80 96 (!) 105  Resp: 19 20 20 19   Temp:      TempSrc:      SpO2:      Weight:      Height:        Intake/Output Summary (Last 24 hours) at 08/22/16 1729 Last data filed at 08/22/16 1536  Gross per 24 hour  Intake           558.83 ml  Output                0 ml  Net           558.83 ml    PULM  Normal effort , no use of accessory muscles CV  No JVD, RRR Abd      No distended, nontender VASC  Access site in the proximal thigh is clean and intact no hematoma, the foot is pink and warm.the pedal pulses are not palpable  Laboratory CBC    Component Value Date/Time   WBC 8.5 08/22/2016 0956   HGB 10.3 (L) 08/22/2016 0956   HGB 6.9 (L) 01/20/2015 0430   HCT 32.5 (L) 08/22/2016 0956   HCT 20.0 (L) 06/08/2015 1300   PLT 201 08/22/2016 0956   PLT 176 01/20/2015 0430    BMET    Component Value Date/Time   NA 139 05/31/2016 1121   NA 141 01/20/2015 0430   K 3.5 05/31/2016 1121   K 3.6 01/20/2015 0430   CL 96 (L) 05/31/2016 1121   CL 99 (L) 01/20/2015 0430   CO2 33 (H) 05/31/2016 1121   CO2 30 01/20/2015 0430   GLUCOSE 104 (H) 05/31/2016 1121   GLUCOSE 119 (H) 01/20/2015 0430   BUN 52 (H) 05/31/2016 1121   BUN 32 (H) 01/20/2015 0430   CREATININE 5.85 (H) 08/21/2016 2015   CREATININE 6.73 (H) 01/20/2015 0430   CALCIUM 8.5 (L) 05/31/2016 1121   CALCIUM 7.8 (L) 01/20/2015 0430   GFRNONAA 9 (L) 08/21/2016 2015   GFRNONAA 7 (L) 01/20/2015 0430   GFRAA 10 (L) 08/21/2016 2015   GFRAA 9 (L) 01/20/2015 0430    Assessment/Planning: POD #1 s/p exclusion of popliteal artery aneurysm on the right with treatment of distal occlusive  disease   Patient has done well overnight I will ask nephrology to see him so that he can continue his dialysis. I'll continue heparin and begin Coumadin therapy. He can go to a regular floor. I'll plan to have physical therapy evaluate him tomorrow    Hortencia Pilar  08/22/2016, 5:29 PM

## 2016-08-22 NOTE — Progress Notes (Signed)
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 Penicillins Hives, Itching and Swelling    Has patient had a PCN reaction causing immediate rash, facial/tongue/throat swelling, SOB or lightheadedness with hypotension: Yes Has patient had a PCN reaction causing severe rash involving mucus membranes or skin necrosis: No Has patient had a PCN reaction that required hospitalization No Has patient had a PCN reaction occurring within the last 10 years: No If all of the above answers are "NO", then may proceed with Cephalosporin use.    Patient Measurements: Height: 6\' 2"  (188 cm) Weight: 176 lb 5.9 oz (80 kg) IBW/kg (Calculated) : 82.2 Heparin Dosing Weight: 79 kg  Vital Signs: Temp: 98.4 F (36.9 C) (11/30 0000) Temp Source: Oral (11/30 0000) BP: 113/76 (11/30 0000) Pulse Rate: 98 (11/29 2300)  Labs:  Recent Labs  08/21/16 1820 08/21/16 2015 08/22/16 0111  HGB 10.7*  --   --   HCT 34.5*  --   --   PLT 287  --   --   APTT 37*  --   --   LABPROT 16.6*  --   --   INR 1.33  --   --   HEPARINUNFRC  --   --  <0.10*  CREATININE  --  5.85*  --     Estimated Creatinine Clearance: 12.5 mL/min (by C-G formula based on SCr of 5.85 mg/dL (H)).  Medical History: Past Medical History:  Diagnosis Date  . AAA (abdominal aortic aneurysm) ([place])   . Allergic rhinitis   . Anemia    a. baseline hgb ~ 8  . Bladder cancer ([place])   . Chronic combined systolic and diastolic CHF (congestive heart failure) ([place])    a. echo 12/2014: EF 25-30%, anterior wall wall motion abnormalities; b. planned ischemic evaluation once patient is stable medically; c. echo 01/2015: EF 45-50%, no RWMA, mild AT, LA mildly dilated, mod pericardial effusion along LV free wall, no evidence of hemodynamic compromise  .  Cognitive communication deficit   . COPD (chronic obstructive pulmonary disease) ([place])   . Dementia   . Detrusor sphincter dyssynergia   . Dysrhythmia   . ESRD on hemodialysis ([place])    a. Tuesday, Thursday, and Saturdays  . GERD (gastroesophageal reflux disease)   . History of septic shock    a. 01/2015  . History of small bowel obstruction    a. 01/2015  . Hydronephrosis   . Hypercholesteremia   . Hypertension   . 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 Incontinence   . Incontinence of urine   . Magnesium deficiency   . Mini stroke ([place])   . Overactive bladder   . PAF (paroxysmal atrial fibrillation) ([place])    a. new onset 12/2014 in the setting of UTI, sepsis, hypotension, and anemia; b. not on long term anticoagulation given anemia; c. family aware of stroke risk, they are ok with this; d. on amiodarone   . Prostate cancer ([place])   . Stroke [facility])     Medications:  Prescriptions Prior to Admission  Medication Sig Dispense Refill Last Dose  . albuterol (PROVENTIL) (2.5 MG/3ML) 0.083% nebulizer solution Take 3 mLs (2.5 mg total) by nebulization every 4 (four) hours as needed for wheezing or shortness of breath. 75 mL 12 08/20/2016 at Unknown time  . Amino Acids-Protein Hydrolys (FEEDING  SUPPLEMENT, PRO-STAT SUGAR FREE 64,) LIQD Take 30 mLs by mouth every evening.   08/20/2016 at Unknown time  . amiodarone (PACERONE) 200 MG tablet Take 1 tablet (200 mg total) by mouth daily. 30 tablet 5 08/20/2016 at Unknown time  . atorvastatin (LIPITOR) 40 MG tablet Take 40 mg by mouth at bedtime.   08/20/2016 at Unknown time  . budesonide-formoterol (SYMBICORT) 160-4.5 MCG/ACT inhaler Inhale 2 puffs into the lungs 2 (two) times daily.   08/20/2016 at Unknown time  . carvedilol (COREG) 3.125 MG tablet Take 1 tablet (3.125 mg total) by mouth 2 (two) times daily with a meal. 60 tablet 2 08/20/2016 at Unknown time  . diclofenac sodium (VOLTAREN) 1 % GEL Apply 2 g topically 3 (three) times daily.    08/20/2016 at  Unknown time  . ferrous sulfate 325 (65 FE) MG tablet Take 325 mg by mouth daily.   08/20/2016 at Unknown time  . folic acid (FOLVITE) A999333 MCG tablet Take 400 mcg by mouth daily.   08/20/2016 at Unknown time  . gabapentin (NEURONTIN) 100 MG capsule Take 100 mg by mouth at bedtime.    08/20/2016 at Unknown time  . guaiFENesin (MUCINEX) 600 MG 12 hr tablet Take 600 mg by mouth 2 (two) times daily. (Take every morning and at bedtime for cough)   08/20/2016 at Unknown time  . guaifenesin (ROBITUSSIN) 100 MG/5ML syrup Take 200 mg by mouth every 4 (four) hours as needed for cough.   Past Week at Unknown time  . ipratropium-albuterol (DUONEB) 0.5-2.5 (3) MG/3ML SOLN Take 3 mLs by nebulization 3 (three) times daily. May inhale an additional  96mls 3 times a day as needed for chronic obstructive pulmonary disease   08/20/2016 at Unknown time  . lamoTRIgine (LAMICTAL) 25 MG tablet Take 50 mg by mouth at bedtime. Start on July 14th.   08/20/2016 at Unknown time  . latanoprost (XALATAN) 0.005 % ophthalmic solution Place 1 drop into both eyes at bedtime.   08/20/2016 at Unknown time  . LORazepam (ATIVAN) 0.5 MG tablet Take 1 tablet (0.5 mg total) by mouth every 4 (four) hours as needed for anxiety. (Patient taking differently: Take 0.5 mg by mouth 2 (two) times daily. May take an additional 0.5mg  every 4 hours as needed for anxiety) 30 tablet 0 08/20/2016 at Unknown time  . magnesium oxide (MAG-OX) 400 MG tablet Take 1 tablet (400 mg total) by mouth every evening. 30 tablet 2 08/20/2016 at Unknown time  . omeprazole (PRILOSEC) 20 MG capsule Take 20 mg by mouth daily.   08/20/2016 at Unknown time  . ondansetron (ZOFRAN) 8 MG tablet Take by mouth every 8 (eight) hours as needed for nausea.   08/20/2016 at Unknown time  . oxyCODONE (OXY IR/ROXICODONE) 5 MG immediate release tablet Take 1 tablet (5 mg total) by mouth every 6 (six) hours as needed for moderate pain or severe pain. 20 tablet 0 08/20/2016 at Unknown time   . OXYGEN Inhale 2-3 L into the lungs as needed. For shortness of breath, respiratory distress or O2 sats <90.   08/21/2016 at Unknown time  . sevelamer carbonate (RENVELA) 800 MG tablet Take 800 mg by mouth 3 (three) times daily.    08/20/2016 at Unknown time  . tiotropium (SPIRIVA) 18 MCG inhalation capsule Place 1 capsule (18 mcg total) into inhaler and inhale daily. 30 capsule 12 08/20/2016 at Unknown time  . [DISCONTINUED] acetaminophen (TYLENOL) 325 MG tablet Take 650 mg by mouth every 8 (eight) hours as  needed. For general discomfort   Past Week at Unknown time   Scheduled:  . amiodarone  200 mg Oral Daily  . atorvastatin  40 mg Oral QHS  . carvedilol  3.125 mg Oral BID WC  . docusate sodium  100 mg Oral Daily  . feeding supplement (PRO-STAT SUGAR FREE 64)  30 mL Oral QPM  . ferrous sulfate  325 mg Oral Daily  . folic acid  [id number] mcg Oral Daily  . gabapentin  100 mg Oral QHS  . guaiFENesin  600 mg Oral BID  . heparin  2,400 Units Intravenous Once  . ipratropium-albuterol  3 mL Nebulization TID  . lamoTRIgine  50 mg Oral QHS  . latanoprost  1 drop Both Eyes QHS  . magnesium oxide  400 mg Oral QPM  . metoprolol  5 mg Intravenous Q6H  . pantoprazole  40 mg Oral Daily  . sevelamer carbonate  800 mg Oral TID WC  . tiotropium  18 mcg Inhalation Daily   Infusions:  . heparin 1,000 Units/hr (08/21/16 1851)  . 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   Baseline labs have been ordered STAT.  I discussed the case with vascular surgeon Dr. Delana Meyer - will give the patient an initial bolus of 30 units/kg and target a HL of 0.3 - 0.5 units/mL  Goal of Therapy:  Heparin level 0.3-0.5 units/ml Monitor platelets by anticoagulation protocol: Yes   Plan:  Give 2350 units bolus x 1 Start heparin  infusion at 1000 units/hr Check anti-Xa level in 6 hours and daily while on heparin Continue to monitor H&H and platelets CBC ordered with AM labs tomorrow.  11/30 01:00 heparin level <0.1. 2400 unit bolus and increase rate to 1300 units/hr. Recheck in 8 hours.  Eloise Harman, PharmD Clinical Pharmacist 08/22/2016,2:18 AM

## 2016-08-23 ENCOUNTER — Encounter: Payer: Self-pay | Admitting: Vascular Surgery

## 2016-08-23 DIAGNOSIS — I70223 Atherosclerosis of native arteries of extremities with rest pain, bilateral legs: Secondary | ICD-10-CM

## 2016-08-23 LAB — CBC
HEMATOCRIT: 31.7 % — AB (ref 40.0–52.0)
Hemoglobin: 9.8 g/dL — ABNORMAL LOW (ref 13.0–18.0)
MCH: 25.8 pg — ABNORMAL LOW (ref 26.0–34.0)
MCHC: 30.8 g/dL — AB (ref 32.0–36.0)
MCV: 83.8 fL (ref 80.0–100.0)
Platelets: 168 10*3/uL (ref 150–440)
RBC: 3.78 MIL/uL — ABNORMAL LOW (ref 4.40–5.90)
RDW: 18.3 % — AB (ref 11.5–14.5)
WBC: 7.2 10*3/uL (ref 3.8–10.6)

## 2016-08-23 LAB — PROTIME-INR
INR: 1.13
PROTHROMBIN TIME: 14.6 s (ref 11.4–15.2)

## 2016-08-23 LAB — HEPARIN LEVEL (UNFRACTIONATED)
HEPARIN UNFRACTIONATED: 0.27 [IU]/mL — AB (ref 0.30–0.70)
HEPARIN UNFRACTIONATED: 0.35 [IU]/mL (ref 0.30–0.70)
HEPARIN UNFRACTIONATED: 0.35 [IU]/mL (ref 0.30–0.70)

## 2016-08-23 LAB — HEPATITIS B SURFACE ANTIGEN: Hepatitis B Surface Ag: NEGATIVE

## 2016-08-23 MED ORDER — EPOETIN ALFA 10000 UNIT/ML IJ SOLN
10000.0000 [IU] | INTRAMUSCULAR | Status: DC
  Administered 2016-08-24 – 2016-08-27 (×2): 10000 [IU] via INTRAVENOUS

## 2016-08-23 MED ORDER — EPOETIN ALFA 10000 UNIT/ML IJ SOLN: 10000.0000 [IU] | INTRAMUSCULAR | Status: DC

## 2016-08-23 MED ORDER — WARFARIN SODIUM 2.5 MG PO TABS
2.5000 mg | ORAL_TABLET | Freq: Every day | ORAL | Status: AC
  Administered 2016-08-23 – 2016-08-25 (×3): 2.5 mg via ORAL
  Filled 2016-08-23 (×3): qty 1

## 2016-08-23 MED ORDER — HEPARIN BOLUS VIA INFUSION
1200.0000 [IU] | Freq: Once | INTRAVENOUS | Status: AC
  Administered 2016-08-23: 1200 [IU] via INTRAVENOUS
  Filled 2016-08-23: qty 1200

## 2016-08-23 NOTE — Progress Notes (Signed)
Patient was c/o IV bleeding, went in to assess patient and blood was noted to be in the floor in a small puddle and also on the bed chuck and sheet. Pressure was applied to IV site for approximately 10 minutes. Heparin gtt was stopped and IV removed from patient. Patient checked for bleeding in other sites. No other sites noted to be bleeding. VSS. Pharmacy called and notified as well as MD Schnier. Dr. Delana Meyer ordered to continue heparin gtt at previous rate in different IV site once all signs of bleeding are stopped. Will restart heparin gtt and continue to monitor.

## 2016-08-23 NOTE — Progress Notes (Signed)
Chaplain moved about the unit to provide a compassionate  presence and support for the patient.  Patient showed happiness that he had visitors. [name] [phone].

## 2016-08-23 NOTE — Care Management (Signed)
Patient transferred to 2A from icu.  He is on a heparin drip. Informed patient is from [employer].  Notified Alda Lea of admission as patient is a chronic HD patient- [facility]

## 2016-08-23 NOTE — Progress Notes (Addendum)
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 Penicillins Hives, Itching and Swelling    Has patient had a PCN reaction causing immediate rash, facial/tongue/throat swelling, SOB or lightheadedness with hypotension: Yes Has patient had a PCN reaction causing severe rash involving mucus membranes or skin necrosis: No Has patient had a PCN reaction that required hospitalization No Has patient had a PCN reaction occurring within the last 10 years: No If all of the above answers are "NO", then may proceed with Cephalosporin use.    Patient Measurements: Height: 6\' 2"  (188 cm) Weight: 177 lb 11.1 oz (80.6 kg) IBW/kg (Calculated) : 82.2 Heparin Dosing Weight: 80 kg  Vital Signs: Temp: 97.9 F (36.6 C) (12/01 1958) Temp Source: Oral (12/01 1958) BP: 102/64 (12/01 1958) Pulse Rate: 93 (12/01 1958)  Labs:  Recent Labs  08/21/16 1820 08/21/16 2015  08/22/16 0956  08/22/16 2339 08/23/16 0810 08/23/16 2013  HGB 10.7*  --   --  10.3*  --   --   --  9.8*  HCT 34.5*  --   --  32.5*  --   --   --  31.7*  PLT 287  --   --  201  --   --   --  168  APTT 37*  --   --   --   --   --   --   --   LABPROT 16.6*  --   --   --   --   --  14.6  --   INR 1.33  --   --   --   --   --  1.13  --   HEPARINUNFRC  --   --   < > 0.17*  < > 0.27* 0.35 0.35  CREATININE  --  5.85*  --   --   --   --   --   --   < > = values in this interval not displayed.  Estimated Creatinine Clearance: 12.6 mL/min (by C-G formula based on SCr of 5.85 mg/dL (H)).  Medical History: Past Medical History:  Diagnosis Date  . AAA (abdominal aortic aneurysm) ([place])   . Allergic rhinitis   . Anemia    a. baseline hgb ~ 8  . Bladder cancer ([place])   . Chronic combined systolic and diastolic CHF (congestive heart failure) ([place])    a. echo 12/2014: EF 25-30%,  anterior wall wall motion abnormalities; b. planned ischemic evaluation once patient is stable medically; c. echo 01/2015: EF 45-50%, no RWMA, mild AT, LA mildly dilated, mod pericardial effusion along LV free wall, no evidence of hemodynamic compromise  . Cognitive communication deficit   . COPD (chronic obstructive pulmonary disease) ([place])   . Dementia   . Detrusor sphincter dyssynergia   . Dysrhythmia   . ESRD on hemodialysis ([place])    a. Tuesday, Thursday, and Saturdays  . GERD (gastroesophageal reflux disease)   . History of septic shock    a. 01/2015  . History of small bowel obstruction    a. 01/2015  . Hydronephrosis   . Hypercholesteremia   . Hypertension   . 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 Incontinence   . Incontinence of urine   . Magnesium deficiency   . Mini stroke ([place])   . Overactive bladder   . PAF (paroxysmal atrial fibrillation) ([place])  a. new onset 12/2014 in the setting of UTI, sepsis, hypotension, and anemia; b. not on long term anticoagulation given anemia; c. family aware of stroke risk, they are ok with this; d. on amiodarone   . Prostate cancer ([place])   . Stroke [facility])     Medications:  Prescriptions Prior to Admission  Medication Sig Dispense Refill Last Dose  . albuterol (PROVENTIL) (2.5 MG/3ML) 0.083% nebulizer solution Take 3 mLs (2.5 mg total) by nebulization every 4 (four) hours as needed for wheezing or shortness of breath. 75 mL 12 08/20/2016 at Unknown time  . Amino Acids-Protein Hydrolys (FEEDING SUPPLEMENT, PRO-STAT SUGAR FREE 64,) LIQD Take 30 mLs by mouth every evening.   08/20/2016 at Unknown time  . amiodarone (PACERONE) 200 MG tablet Take 1 tablet (200 mg total) by mouth daily. 30 tablet 5 08/20/2016 at Unknown time  . atorvastatin (LIPITOR) 40 MG tablet Take 40 mg by mouth at bedtime.   08/20/2016 at Unknown time  . budesonide-formoterol (SYMBICORT) 160-4.5 MCG/ACT inhaler Inhale 2 puffs into the lungs 2 (two) times daily.   08/20/2016 at Unknown time   . carvedilol (COREG) 3.125 MG tablet Take 1 tablet (3.125 mg total) by mouth 2 (two) times daily with a meal. 60 tablet 2 08/20/2016 at Unknown time  . diclofenac sodium (VOLTAREN) 1 % GEL Apply 2 g topically 3 (three) times daily.    08/20/2016 at Unknown time  . ferrous sulfate 325 (65 FE) MG tablet Take 325 mg by mouth daily.   08/20/2016 at Unknown time  . folic acid (FOLVITE) A999333 MCG tablet Take 400 mcg by mouth daily.   08/20/2016 at Unknown time  . gabapentin (NEURONTIN) 100 MG capsule Take 100 mg by mouth at bedtime.    08/20/2016 at Unknown time  . guaiFENesin (MUCINEX) 600 MG 12 hr tablet Take 600 mg by mouth 2 (two) times daily. (Take every morning and at bedtime for cough)   08/20/2016 at Unknown time  . guaifenesin (ROBITUSSIN) 100 MG/5ML syrup Take 200 mg by mouth every 4 (four) hours as needed for cough.   Past Week at Unknown time  . ipratropium-albuterol (DUONEB) 0.5-2.5 (3) MG/3ML SOLN Take 3 mLs by nebulization 3 (three) times daily. May inhale an additional  81mls 3 times a day as needed for chronic obstructive pulmonary disease   08/20/2016 at Unknown time  . lamoTRIgine (LAMICTAL) 25 MG tablet Take 50 mg by mouth at bedtime. Start on July 14th.   08/20/2016 at Unknown time  . latanoprost (XALATAN) 0.005 % ophthalmic solution Place 1 drop into both eyes at bedtime.   08/20/2016 at Unknown time  . LORazepam (ATIVAN) 0.5 MG tablet Take 1 tablet (0.5 mg total) by mouth every 4 (four) hours as needed for anxiety. (Patient taking differently: Take 0.5 mg by mouth 2 (two) times daily. May take an additional 0.5mg  every 4 hours as needed for anxiety) 30 tablet 0 08/20/2016 at Unknown time  . magnesium oxide (MAG-OX) 400 MG tablet Take 1 tablet (400 mg total) by mouth every evening. 30 tablet 2 08/20/2016 at Unknown time  . omeprazole (PRILOSEC) 20 MG capsule Take 20 mg by mouth daily.   08/20/2016 at Unknown time  . ondansetron (ZOFRAN) 8 MG tablet Take by mouth every 8 (eight) hours as  needed for nausea.   08/20/2016 at Unknown time  . oxyCODONE (OXY IR/ROXICODONE) 5 MG immediate release tablet Take 1 tablet (5 mg total) by mouth every 6 (six) hours as needed for moderate pain  or severe pain. 20 tablet 0 08/20/2016 at Unknown time  . OXYGEN Inhale 2-3 L into the lungs as needed. For shortness of breath, respiratory distress or O2 sats <90.   08/21/2016 at Unknown time  . sevelamer carbonate (RENVELA) 800 MG tablet Take 800 mg by mouth 3 (three) times daily.    08/20/2016 at Unknown time  . tiotropium (SPIRIVA) 18 MCG inhalation capsule Place 1 capsule (18 mcg total) into inhaler and inhale daily. 30 capsule 12 08/20/2016 at Unknown time  . [DISCONTINUED] acetaminophen (TYLENOL) 325 MG tablet Take 650 mg by mouth every 8 (eight) hours as needed. For general discomfort   Past Week at Unknown time   Scheduled:  . amiodarone  200 mg Oral Daily  . atorvastatin  40 mg Oral QHS  . carvedilol  3.125 mg Oral BID WC  . docusate sodium  100 mg Oral Daily  . [START ON 08/24/2016] epoetin (EPOGEN/PROCRIT) injection  10,000 Units Intravenous Q T,Th,Sa-HD  . feeding supplement (PRO-STAT SUGAR FREE 64)  30 mL Oral QPM  . ferrous sulfate  325 mg Oral Daily  . folic acid  [id number] mcg Oral Daily  . gabapentin  100 mg Oral QHS  . guaiFENesin  600 mg Oral BID  . ipratropium-albuterol  3 mL Nebulization TID  . lamoTRIgine  50 mg Oral QHS  . latanoprost  1 drop Both Eyes QHS  . magnesium oxide  400 mg Oral QPM  . mouth rinse  15 mL Mouth Rinse BID  . pantoprazole  40 mg Oral Daily  . sevelamer carbonate  800 mg Oral TID WC  . tiotropium  18 mcg Inhalation Daily  . warfarin  2.5 mg Oral q1800  . Warfarin - Physician Dosing Inpatient   Does not apply q1800   Infusions:  . heparin 1,650 Units/hr (08/23/16 1902)  . 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   Baseline labs have been ordered STAT.  11/29 Case was discussed with vascular surgeon Dr. Delana Meyer - gave the patient an initial bolus of 30 units/kg and target a HL of 0.3 - 0.5 units/mL  11/30 01:00 heparin level <0.1. 2400 unit bolus and increase rate to 1300 units/hr. Recheck in 8 hours.  Goal of Therapy:  Heparin level 0.3-0.5 units/ml Monitor platelets by anticoagulation protocol: Yes   Plan:  12/1 0800 HL =0.35 (goal 0.3-0.5)  RN called around 1030 stating that pt was bleeding from IV site. She turned drip off and removed IV-held pressure to site. Per Dr. Delana Meyer ok to resume drip at same rate (1650 units/hr) at different IV site once bleeding has stopped. RN to restart drip at 1100 Will recheck level in 8 hours @ 1900  12/1 20:00 heparin level 0.35. Continue current regimen. Recheck with tomorrow AM labs.  12/2 AM heparin level 0.21. 1200 unit bolus and increase rate to 1800 units/hr. Recheck in 8 hours.  Ramond Dial, Pharm.D, BCPS Clinical Pharmacist  08/23/2016 10:14 PM

## 2016-08-23 NOTE — Clinical Social Work Note (Signed)
MSW informed patient is from [facility].  MSW to complete assessment at a later time.  Jones Broom. Tyrail Grandfield, MSW [phone]  Mon-Fri 8a-4:30p 08/23/2016 7:10 PM

## 2016-08-23 NOTE — Progress Notes (Signed)
[facility]  Daily Progress Note   Subjective: 2 Days Post-Op: Introduction catheter into right lower extremity 3rd order catheter placement with additional third order, Contrast injection right lower extremity for distal runoff, Percutaneous transluminal angioplasty and stent placement for exclusion of distal SFA and popliteal artery aneurysm with Viabahn stent grafts, Percutaneous transluminal angioplasty to 3 mm right posterior tibial artery, Percutaneous transluminal angioplasty to 2 mm right peroneal artery, Mechanical thrombus extraction using the penumbra catheter right popliteal distally with pre-close closure right superficial femoral femoral arteriotomy.  Patient is without complaint this afternoon. States pain in his right lower extremity is "better". Explained to patient we restarted his coumadin today. He will stay as inpatient until therapeutic when heparin gtt will then be stopped and he can be discharged. He expresses his understanding.   Objective: Vitals:   08/23/16 0752 08/23/16 0837 08/23/16 1032 08/23/16 1129  BP: (!) 89/50  96/63 (!) 92/52  Pulse: 100  86 (!) 132  Resp: 16   18  Temp: 97.8 F (36.6 C)   97.3 F (36.3 C)  TempSrc: Oral   Oral  SpO2: 98% 98%  91%  Weight:      Height:        Intake/Output Summary (Last 24 hours) at 08/23/16 1649 Last data filed at 08/23/16 1432  Gross per 24 hour  Intake              240 ml  Output             1000 ml  Net             -760 ml   Physical Exam: A&Ox3, NAD CV: RRR Pulmonary: CTA Bilaterally Abdomen: Soft, Nontender, Nondistended Groin: Access site without swelling or drainage Vascular: Right Lower Extremity - extremity / foot warm. Non-palpable pedal pulses.    Laboratory: CBC    Component Value Date/Time   WBC 8.5 08/22/2016 0956   HGB 10.3 (L) 08/22/2016 0956   HGB 6.9 (L) 01/20/2015 0430   HCT 32.5 (L) 08/22/2016 0956   HCT 20.0 (L) 06/08/2015 1300   PLT 201 08/22/2016 0956   PLT 176 01/20/2015 0430    BMET    Component Value Date/Time   NA 139 05/31/2016 1121   NA 141 01/20/2015 0430   K 3.5 05/31/2016 1121   K 3.6 01/20/2015 0430   CL 96 (L) 05/31/2016 1121   CL 99 (L) 01/20/2015 0430   CO2 33 (H) 05/31/2016 1121   CO2 30 01/20/2015 0430   GLUCOSE 104 (H) 05/31/2016 1121   GLUCOSE 119 (H) 01/20/2015 0430   BUN 52 (H) 05/31/2016 1121   BUN 32 (H) 01/20/2015 0430   CREATININE 5.85 (H) 08/21/2016 2015   CREATININE 6.73 (H) 01/20/2015 0430   CALCIUM 8.5 (L) 05/31/2016 1121   CALCIUM 7.8 (L) 01/20/2015 0430   GFRNONAA 9 (L) 08/21/2016 2015   GFRNONAA 7 (L) 01/20/2015 0430   GFRAA 10 (L) 08/21/2016 2015   GFRAA 9 (L) 01/20/2015 0430    Assessment/Planning: The patient is a 75 year old male s/p POD #2 exclusion of popliteal artery aneurysm on the right with treatment of distal occlusive disease. 1) Started coumadin today. 2) Will continue heparin until the patient is therapeutic INR 3) Physical Therapy 4) Pain Control  Marcelle Overlie PA-C 08/23/2016 4:49 PM

## 2016-08-23 NOTE — Progress Notes (Signed)
[facility]  ROUNDING NOTE   Subjective:  Patient transitioned to floor care. He remains on heparin drip. He completed hemodialysis yesterday.    Objective:  Vital signs in last 24 hours:  Temp:  [97.1 F (36.2 C)-98.2 F (36.8 C)] 97.8 F (36.6 C) (12/01 0752) Pulse Rate:  [80-106] 100 (12/01 0752) Resp:  [16-23] 16 (12/01 0752) BP: (70-115)/(50-80) 89/50 (12/01 0752) SpO2:  [89 %-100 %] 98 % (12/01 0752) Arterial Line BP: (100-135)/(51-61) 117/52 (11/30 1500) Weight:  [80.6 kg (177 lb 11.1 oz)-81.6 kg (179 lb 14.3 oz)] 80.6 kg (177 lb 11.1 oz) (11/30 1952)  Weight change: 2.674 kg (5 lb 14.3 oz) Filed Weights   08/21/16 1744 08/22/16 1615 08/22/16 1952  Weight: 80 kg (176 lb 5.9 oz) 81.6 kg (179 lb 14.3 oz) 80.6 kg (177 lb 11.1 oz)    Intake/Output: I/O last 3 completed shifts: In: 503.8 [P.O.:145; I.V.:358.8] Out: 1000 [Other:1000]   Intake/Output this shift:  No intake/output data recorded.  Physical Exam: General: No acute distress  Head: Normocephalic, atraumatic. Moist oral mucosal membranes  Eyes: Anicteric  Neck: Supple, trachea midline  Lungs:  Scattered rhonchi, normal effort  Heart: S1S2 irregular  Abdomen:  Soft, nontender, bowel sounds present   Extremities: trace peripheral edema.  Neurologic: Nonfocal, moving all four extremities  Skin: No lesions  Access: LUE AVF    Basic Metabolic Panel:  Recent Labs Lab 08/21/16 2015  CREATININE 5.85*    Liver Function Tests: No results for input(s): AST, ALT, ALKPHOS, BILITOT, PROT, ALBUMIN in the last 168 hours. No results for input(s): LIPASE, AMYLASE in the last 168 hours. No results for input(s): AMMONIA in the last 168 hours.  CBC:  Recent Labs Lab 08/21/16 1820 08/22/16 0956  WBC 17.2* 8.5  HGB 10.7* 10.3*  HCT 34.5* 32.5*  MCV 82.6 81.1  PLT 287 201    Cardiac Enzymes: No results for input(s): CKTOTAL, CKMB, CKMBINDEX, TROPONINI in the last 168  hours.  BNP: Invalid input(s): POCBNP  CBG:  Recent Labs Lab 08/21/16 1749 08/22/16 0742 08/22/16 1129 08/22/16 1557  GLUCAP 80 139* 134* 132*    Microbiology: Results for orders placed or performed during the hospital encounter of 08/21/16  MRSA PCR Screening     Status: None   Collection Time: 08/21/16 11:00 AM  Result Value Ref Range Status   MRSA by PCR NEGATIVE NEGATIVE Final    Comment:        The GeneXpert MRSA Assay (FDA approved for NASAL specimens only), is one component of a comprehensive MRSA colonization surveillance program. It is not intended to diagnose MRSA infection nor to guide or monitor treatment for MRSA infections.     Coagulation Studies:  Recent Labs  08/21/16 1820 08/23/16 0810  LABPROT 16.6* 14.6  INR 1.33 1.13    Urinalysis: No results for input(s): COLORURINE, LABSPEC, PHURINE, GLUCOSEU, HGBUR, BILIRUBINUR, KETONESUR, PROTEINUR, UROBILINOGEN, NITRITE, LEUKOCYTESUR in the last 72 hours.  Invalid input(s): APPERANCEUR    Imaging: No results found.   Medications:   . heparin 1,650 Units/hr (08/23/16 0030)  . phenylephrine (NEO-SYNEPHRINE) Adult infusion Stopped (08/22/16 0701)   . amiodarone  200 mg Oral Daily  . atorvastatin  40 mg Oral QHS  . carvedilol  3.125 mg Oral BID WC  . docusate sodium  100 mg Oral Daily  . feeding supplement (PRO-STAT SUGAR FREE 64)  30 mL Oral QPM  . ferrous sulfate  325 mg Oral Daily  . folic acid  [id number]  mcg Oral Daily  . gabapentin  100 mg Oral QHS  . guaiFENesin  600 mg Oral BID  . ipratropium-albuterol  3 mL Nebulization TID  . lamoTRIgine  50 mg Oral QHS  . latanoprost  1 drop Both Eyes QHS  . magnesium oxide  400 mg Oral QPM  . mouth rinse  15 mL Mouth Rinse BID  . pantoprazole  40 mg Oral Daily  . sevelamer carbonate  800 mg Oral TID WC  . tiotropium  18 mcg Inhalation Daily  . warfarin  7.5 mg Oral q1800  . Warfarin - Physician Dosing Inpatient   Does not apply q1800   sodium  chloride, sodium chloride, acetaminophen **OR** acetaminophen, albuterol, alteplase, alum & mag hydroxide-simeth, guaifenesin, heparin, HYDROmorphone (DILAUDID) injection, lidocaine (PF), lidocaine-prilocaine, ondansetron, oxyCODONE, pentafluoroprop-tetrafluoroeth  Assessment/ Plan:  75 y.o. 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  ESRD on HD TTS;Admitted for angiogram of the right lower extremity with history of large popliteal aneurysm. -Patient completed hemodialysis yesterday. No acute indication for dialysis today. We will plan for hemodialysis again tomorrow.   2.  Anemia chronic kidney disease.  Patient to be started on Epogen with next dialysis treatment.  3.  Secondary hyperparathyroidism.  Check phosphorus with next dialysis treatment. Continue Renvela 800 mg by mouth 3 times a day with meals.  4.  Right popliteal artery aneurysm.  Status post angiogram.  Vascular surgery following closely.  Currently on heparin drip.     LOS: 2 [name] 12/1/20179:22 AM

## 2016-08-23 NOTE — Progress Notes (Addendum)
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 Penicillins Hives, Itching and Swelling    Has patient had a PCN reaction causing immediate rash, facial/tongue/throat swelling, SOB or lightheadedness with hypotension: Yes Has patient had a PCN reaction causing severe rash involving mucus membranes or skin necrosis: No Has patient had a PCN reaction that required hospitalization No Has patient had a PCN reaction occurring within the last 10 years: No If all of the above answers are "NO", then may proceed with Cephalosporin use.    Patient Measurements: Height: 6\' 2"  (188 cm) Weight: 177 lb 11.1 oz (80.6 kg) IBW/kg (Calculated) : 82.2 Heparin Dosing Weight: 80 kg  Vital Signs: Temp: 97.8 F (36.6 C) (12/01 0752) Temp Source: Oral (12/01 0752) BP: 96/63 (12/01 1032) Pulse Rate: 86 (12/01 1032)  Labs:  Recent Labs  08/21/16 1820 08/21/16 2015  08/22/16 0956 08/22/16 2040 08/22/16 2339 08/23/16 0810  HGB 10.7*  --   --  10.3*  --   --   --   HCT 34.5*  --   --  32.5*  --   --   --   PLT 287  --   --  201  --   --   --   APTT 37*  --   --   --   --   --   --   LABPROT 16.6*  --   --   --   --   --  14.6  INR 1.33  --   --   --   --   --  1.13  HEPARINUNFRC  --   --   < > 0.17* 3.58* 0.27* 0.35  CREATININE  --  5.85*  --   --   --   --   --   < > = values in this interval not displayed.  Estimated Creatinine Clearance: 12.6 mL/min (by C-G formula based on SCr of 5.85 mg/dL (H)).  Medical History: Past Medical History:  Diagnosis Date  . AAA (abdominal aortic aneurysm) ([place])   . Allergic rhinitis   . Anemia    a. baseline hgb ~ 8  . Bladder cancer ([place])   . Chronic combined systolic and diastolic CHF (congestive heart failure) ([place])    a. echo 12/2014: EF 25-30%, anterior wall wall motion abnormalities;  b. planned ischemic evaluation once patient is stable medically; c. echo 01/2015: EF 45-50%, no RWMA, mild AT, LA mildly dilated, mod pericardial effusion along LV free wall, no evidence of hemodynamic compromise  . Cognitive communication deficit   . COPD (chronic obstructive pulmonary disease) ([place])   . Dementia   . Detrusor sphincter dyssynergia   . Dysrhythmia   . ESRD on hemodialysis ([place])    a. Tuesday, Thursday, and Saturdays  . GERD (gastroesophageal reflux disease)   . History of septic shock    a. 01/2015  . History of small bowel obstruction    a. 01/2015  . Hydronephrosis   . Hypercholesteremia   . Hypertension   . 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 Incontinence   . Incontinence of urine   . Magnesium deficiency   . Mini stroke ([place])   . Overactive bladder   . PAF (paroxysmal atrial fibrillation) ([place])    a. new onset 12/2014 in the setting of UTI, sepsis, hypotension, and anemia; b. not on long  term anticoagulation given anemia; c. family aware of stroke risk, they are ok with this; d. on amiodarone   . Prostate cancer ([place])   . Stroke [facility])     Medications:  Prescriptions Prior to Admission  Medication Sig Dispense Refill Last Dose  . albuterol (PROVENTIL) (2.5 MG/3ML) 0.083% nebulizer solution Take 3 mLs (2.5 mg total) by nebulization every 4 (four) hours as needed for wheezing or shortness of breath. 75 mL 12 08/20/2016 at Unknown time  . Amino Acids-Protein Hydrolys (FEEDING SUPPLEMENT, PRO-STAT SUGAR FREE 64,) LIQD Take 30 mLs by mouth every evening.   08/20/2016 at Unknown time  . amiodarone (PACERONE) 200 MG tablet Take 1 tablet (200 mg total) by mouth daily. 30 tablet 5 08/20/2016 at Unknown time  . atorvastatin (LIPITOR) 40 MG tablet Take 40 mg by mouth at bedtime.   08/20/2016 at Unknown time  . budesonide-formoterol (SYMBICORT) 160-4.5 MCG/ACT inhaler Inhale 2 puffs into the lungs 2 (two) times daily.   08/20/2016 at Unknown time  . carvedilol (COREG) 3.125 MG tablet  Take 1 tablet (3.125 mg total) by mouth 2 (two) times daily with a meal. 60 tablet 2 08/20/2016 at Unknown time  . diclofenac sodium (VOLTAREN) 1 % GEL Apply 2 g topically 3 (three) times daily.    08/20/2016 at Unknown time  . ferrous sulfate 325 (65 FE) MG tablet Take 325 mg by mouth daily.   08/20/2016 at Unknown time  . folic acid (FOLVITE) [id number] MCG tablet Take 400 mcg by mouth daily.   08/20/2016 at Unknown time  . gabapentin (NEURONTIN) 100 MG capsule Take 100 mg by mouth at bedtime.    08/20/2016 at Unknown time  . guaiFENesin (MUCINEX) 600 MG 12 hr tablet Take 600 mg by mouth 2 (two) times daily. (Take every morning and at bedtime for cough)   08/20/2016 at Unknown time  . guaifenesin (ROBITUSSIN) 100 MG/5ML syrup Take 200 mg by mouth every 4 (four) hours as needed for cough.   Past Week at Unknown time  . ipratropium-albuterol (DUONEB) 0.5-2.5 (3) MG/3ML SOLN Take 3 mLs by nebulization 3 (three) times daily. May inhale an additional  74mls 3 times a day as needed for chronic obstructive pulmonary disease   08/20/2016 at Unknown time  . lamoTRIgine (LAMICTAL) 25 MG tablet Take 50 mg by mouth at bedtime. Start on July 14th.   08/20/2016 at Unknown time  . latanoprost (XALATAN) 0.005 % ophthalmic solution Place 1 drop into both eyes at bedtime.   08/20/2016 at Unknown time  . LORazepam (ATIVAN) 0.5 MG tablet Take 1 tablet (0.5 mg total) by mouth every 4 (four) hours as needed for anxiety. (Patient taking differently: Take 0.5 mg by mouth 2 (two) times daily. May take an additional 0.5mg  every 4 hours as needed for anxiety) 30 tablet 0 08/20/2016 at Unknown time  . magnesium oxide (MAG-OX) 400 MG tablet Take 1 tablet (400 mg total) by mouth every evening. 30 tablet 2 08/20/2016 at Unknown time  . omeprazole (PRILOSEC) 20 MG capsule Take 20 mg by mouth daily.   08/20/2016 at Unknown time  . ondansetron (ZOFRAN) 8 MG tablet Take by mouth every 8 (eight) hours as needed for nausea.   08/20/2016 at  Unknown time  . oxyCODONE (OXY IR/ROXICODONE) 5 MG immediate release tablet Take 1 tablet (5 mg total) by mouth every 6 (six) hours as needed for moderate pain or severe pain. 20 tablet 0 08/20/2016 at Unknown time  . OXYGEN Inhale 2-3 L into  the lungs as needed. For shortness of breath, respiratory distress or O2 sats <90.   08/21/2016 at Unknown time  . sevelamer carbonate (RENVELA) 800 MG tablet Take 800 mg by mouth 3 (three) times daily.    08/20/2016 at Unknown time  . tiotropium (SPIRIVA) 18 MCG inhalation capsule Place 1 capsule (18 mcg total) into inhaler and inhale daily. 30 capsule 12 08/20/2016 at Unknown time  . [DISCONTINUED] acetaminophen (TYLENOL) 325 MG tablet Take 650 mg by mouth every 8 (eight) hours as needed. For general discomfort   Past Week at Unknown time   Scheduled:  . amiodarone  200 mg Oral Daily  . atorvastatin  40 mg Oral QHS  . carvedilol  3.125 mg Oral BID WC  . docusate sodium  100 mg Oral Daily  . epoetin (EPOGEN/PROCRIT) injection  10,000 Units Intravenous Q M,W,F-HD  . feeding supplement (PRO-STAT SUGAR FREE 64)  30 mL Oral QPM  . ferrous sulfate  325 mg Oral Daily  . folic acid  [id number] mcg Oral Daily  . gabapentin  100 mg Oral QHS  . guaiFENesin  600 mg Oral BID  . ipratropium-albuterol  3 mL Nebulization TID  . lamoTRIgine  50 mg Oral QHS  . latanoprost  1 drop Both Eyes QHS  . magnesium oxide  400 mg Oral QPM  . mouth rinse  15 mL Mouth Rinse BID  . pantoprazole  40 mg Oral Daily  . sevelamer carbonate  800 mg Oral TID WC  . tiotropium  18 mcg Inhalation Daily  . warfarin  7.5 mg Oral q1800  . Warfarin - Physician Dosing Inpatient   Does not apply q1800   Infusions:  . heparin Stopped (08/23/16 1039)  . 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   Baseline labs  have been ordered STAT.  11/29 Case was discussed with vascular surgeon Dr. Delana Meyer - gave the patient an initial bolus of 30 units/kg and target a HL of 0.3 - 0.5 units/mL  11/30 01:00 heparin level <0.1. 2400 unit bolus and increase rate to 1300 units/hr. Recheck in 8 hours.  Goal of Therapy:  Heparin level 0.3-0.5 units/ml Monitor platelets by anticoagulation protocol: Yes   Plan:  12/1 0800 HL =0.35 (goal 0.3-0.5)  RN called around 1030 stating that pt was bleeding from IV site. She turned drip off and removed IV-held pressure to site. Per Dr. Delana Meyer ok to resume drip at same rate (1650 units/hr) at different IV site once bleeding has stopped. RN to restart drip at 1100 Will recheck level in 8 hours @ [facility], Pharm.D, BCPS Clinical Pharmacist  08/23/2016 10:56 AM

## 2016-08-24 LAB — CBC
HCT: 30.7 % — ABNORMAL LOW (ref 40.0–52.0)
HEMOGLOBIN: 9.4 g/dL — AB (ref 13.0–18.0)
MCH: 25.3 pg — AB (ref 26.0–34.0)
MCHC: 30.7 g/dL — AB (ref 32.0–36.0)
MCV: 82.5 fL (ref 80.0–100.0)
PLATELETS: 169 10*3/uL (ref 150–440)
RBC: 3.73 MIL/uL — ABNORMAL LOW (ref 4.40–5.90)
RDW: 18.5 % — AB (ref 11.5–14.5)
WBC: 8.6 10*3/uL (ref 3.8–10.6)

## 2016-08-24 LAB — HEPARIN LEVEL (UNFRACTIONATED)
HEPARIN UNFRACTIONATED: 0.21 [IU]/mL — AB (ref 0.30–0.70)
HEPARIN UNFRACTIONATED: 0.26 [IU]/mL — AB (ref 0.30–0.70)

## 2016-08-24 LAB — PROTIME-INR
INR: 1.22
PROTHROMBIN TIME: 15.5 s — AB (ref 11.4–15.2)

## 2016-08-24 LAB — PHOSPHORUS: Phosphorus: 4.4 mg/dL (ref 2.5–4.6)

## 2016-08-24 MED ORDER — HEPARIN BOLUS VIA INFUSION
1200.0000 [IU] | Freq: Once | INTRAVENOUS | Status: AC
  Administered 2016-08-24: 1200 [IU] via INTRAVENOUS
  Filled 2016-08-24: qty 1200

## 2016-08-24 NOTE — Evaluation (Signed)
Physical Therapy Evaluation Patient Details Name: [name] MRN: [id number] DOB: Oct 11, 1940 Today's Date: 08/24/2016   History of Present Illness  75 yo male with onset of a popliteal artery aneurysm on RLE and repair, angioplasty and stenting of distal SFA and popliteal a an.  PMHx:  Stroke, prostate CA, dementia, PAF, COPD  Clinical Impression  Pt is evaluated and expecting to return to [facility] for rehab, but has been unable to walk for a year.  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  WIll follow acutely and expect him to transition to SNF for rehab when ready medically.    Follow Up Recommendations SNF    Equipment Recommendations  None recommended by PT    Recommendations for Other Services       Precautions / Restrictions Precautions Precautions: Fall (telemetry) Precaution Comments: hip and knee flexion contractures from previously Restrictions Weight Bearing Restrictions: No      Mobility  Bed Mobility Overal bed mobility: Needs Assistance Bed Mobility: Supine to Sit;Sit to Supine     Supine to sit: Max assist Sit to supine: Max assist;Total assist   General bed mobility comments: needs help due to flexion contractures of hips and knees  Transfers Overall transfer level: Needs assistance               General transfer comment: unable to transfer due to pain and weakness but  was hoyer previously  Ambulation/Gait             General Gait Details: not ambulatory  Stairs            Wheelchair Mobility    Modified Rankin (Stroke Patients Only)       Balance Overall balance assessment: Needs assistance Sitting-balance support: Feet supported Sitting balance-Leahy Scale: Fair                                       Pertinent Vitals/Pain Pain Assessment: Faces Faces Pain Scale: Hurts even  more Pain Location: R leg with bed mob  Pain Descriptors / Indicators: Aching;Operative site guarding Pain Intervention(s): Limited activity within patient's tolerance;Monitored during session;Premedicated before session;Repositioned;Utilized relaxation techniques    Home Living Family/patient expects to be discharged to:: Skilled nursing facility                      Prior Function Level of Independence: Needs assistance   Gait / Transfers Assistance Needed: hoyer transfer for the last year  ADL's / Homemaking Assistance Needed: SNF staff to assist ADL's and meals        Hand Dominance        Extremity/Trunk Assessment   Upper Extremity Assessment: Generalized weakness           Lower Extremity Assessment: Generalized weakness      Cervical / Trunk Assessment: Normal  Communication   Communication: No difficulties  Cognition Arousal/Alertness: Awake/alert Behavior During Therapy: WFL for tasks assessed/performed Overall Cognitive Status: Within Functional Limits for tasks assessed                      General Comments      Exercises     Assessment/Plan    PT Assessment Patient needs continued PT services  PT Problem List Decreased strength;Decreased  range of motion;Decreased activity tolerance;Decreased balance;Decreased mobility;Decreased coordination;Decreased knowledge of use of DME;Decreased knowledge of precautions;Decreased cognition;Decreased safety awareness;Cardiopulmonary status limiting activity;Pain;Decreased skin integrity          PT Treatment Interventions DME instruction;Functional mobility training;Therapeutic activities;Therapeutic exercise;Balance training;Neuromuscular re-education;Patient/family education    PT Goals (Current goals can be found in the Care Plan section)  Acute Rehab PT Goals Patient Stated Goal: to get to stand again PT Goal Formulation: With patient Time For Goal Achievement: 09/07/16 Potential to  Achieve Goals: Good    Frequency Min 2X/week   Barriers to discharge  (SNF resident)      Co-evaluation               End of Session Equipment Utilized During Treatment: Oxygen Activity Tolerance: Patient limited by fatigue;Patient limited by lethargy;Treatment limited secondary to medical complications (Comment) Patient left: in bed;with call bell/phone within reach;with bed alarm set Nurse Communication: Mobility status         Time: [id number] PT Time Calculation (min) (ACUTE ONLY): 26 min   Charges:   PT Evaluation $PT Eval Moderate Complexity: 1 Procedure PT Treatments $Therapeutic Activity: 8-22 mins   PT G Codes:        [name] 25-Aug-2016, 1:08 PM    Mee Hives, PT MS Acute Rehab Dept. Number: [place]

## 2016-08-24 NOTE — Progress Notes (Signed)
Pre hd assessment  

## 2016-08-24 NOTE — Progress Notes (Signed)
Start of hd 

## 2016-08-24 NOTE — Progress Notes (Signed)
[facility]  ROUNDING NOTE   Subjective:  Patient resting comfortable. He is due for hemodialysis today. He reports a good appetite.   Objective:  Vital signs in last 24 hours:  Temp:  [97.2 F (36.2 C)-98 F (36.7 C)] 98 F (36.7 C) (12/02 1235) Pulse Rate:  [75-105] 104 (12/02 1245) Resp:  [15-18] 17 (12/02 1245) BP: (102-131)/(61-105) 110/63 (12/02 1235) SpO2:  [79 %-100 %] 100 % (12/02 1245) Weight:  [84.6 kg (186 lb 8.2 oz)] 84.6 kg (186 lb 8.2 oz) (12/02 1235)  Weight change:  Filed Weights   08/22/16 1615 08/22/16 1952 08/24/16 1235  Weight: 81.6 kg (179 lb 14.3 oz) 80.6 kg (177 lb 11.1 oz) 84.6 kg (186 lb 8.2 oz)    Intake/Output: I/O last 3 completed shifts: In: 240 [P.O.:240] Out: 1000 [Other:1000]   Intake/Output this shift:  Total I/O In: 240 [P.O.:240] Out: 0   Physical Exam: General: No acute distress  Head: Normocephalic, atraumatic. Moist oral mucosal membranes  Eyes: Anicteric  Neck: Supple, trachea midline  Lungs:  Scattered rhonchi, normal effort  Heart: S1S2 irregular  Abdomen:  Soft, nontender, bowel sounds present   Extremities: trace peripheral edema.  Neurologic: Nonfocal, moving all four extremities  Skin: No lesions  Access: LUE AVF    Basic Metabolic Panel:  Recent Labs Lab 08/21/16 2015 08/24/16 0603  CREATININE 5.85*  --   PHOS  --  4.4    Liver Function Tests: No results for input(s): AST, ALT, ALKPHOS, BILITOT, PROT, ALBUMIN in the last 168 hours. No results for input(s): LIPASE, AMYLASE in the last 168 hours. No results for input(s): AMMONIA in the last 168 hours.  CBC:  Recent Labs Lab 08/21/16 1820 08/22/16 0956 08/23/16 2013 08/24/16 0603  WBC 17.2* 8.5 7.2 8.6  HGB 10.7* 10.3* 9.8* 9.4*  HCT 34.5* 32.5* 31.7* 30.7*  MCV 82.6 81.1 83.8 82.5  PLT 287 201 168 169    Cardiac Enzymes: No results for input(s): CKTOTAL, CKMB, CKMBINDEX, TROPONINI in the last 168 hours.  BNP: Invalid  input(s): POCBNP  CBG:  Recent Labs Lab 08/21/16 1749 08/22/16 0742 08/22/16 1129 08/22/16 1557  GLUCAP 80 139* 134* 132*    Microbiology: Results for orders placed or performed during the hospital encounter of 08/21/16  MRSA PCR Screening     Status: None   Collection Time: 08/21/16 11:00 AM  Result Value Ref Range Status   MRSA by PCR NEGATIVE NEGATIVE Final    Comment:        The GeneXpert MRSA Assay (FDA approved for NASAL specimens only), is one component of a comprehensive MRSA colonization surveillance program. It is not intended to diagnose MRSA infection nor to guide or monitor treatment for MRSA infections.     Coagulation Studies:  Recent Labs  08/21/16 1820 08/23/16 0810 08/24/16 0603  LABPROT 16.6* 14.6 15.5*  INR 1.33 1.13 1.22    Urinalysis: No results for input(s): COLORURINE, LABSPEC, PHURINE, GLUCOSEU, HGBUR, BILIRUBINUR, KETONESUR, PROTEINUR, UROBILINOGEN, NITRITE, LEUKOCYTESUR in the last 72 hours.  Invalid input(s): APPERANCEUR    Imaging: No results found.   Medications:   . heparin 1,800 Units/hr (08/24/16 0721)  . phenylephrine (NEO-SYNEPHRINE) Adult infusion Stopped (08/22/16 0701)   . amiodarone  200 mg Oral Daily  . atorvastatin  40 mg Oral QHS  . carvedilol  3.125 mg Oral BID WC  . docusate sodium  100 mg Oral Daily  . epoetin (EPOGEN/PROCRIT) injection  10,000 Units Intravenous Q T,Th,Sa-HD  . feeding  supplement (PRO-STAT SUGAR FREE 64)  30 mL Oral QPM  . ferrous sulfate  325 mg Oral Daily  . folic acid  [id number] mcg Oral Daily  . gabapentin  100 mg Oral QHS  . guaiFENesin  600 mg Oral BID  . ipratropium-albuterol  3 mL Nebulization TID  . lamoTRIgine  50 mg Oral QHS  . latanoprost  1 drop Both Eyes QHS  . magnesium oxide  400 mg Oral QPM  . mouth rinse  15 mL Mouth Rinse BID  . pantoprazole  40 mg Oral Daily  . sevelamer carbonate  800 mg Oral TID WC  . tiotropium  18 mcg Inhalation Daily  . warfarin  2.5 mg Oral  q1800  . Warfarin - Physician Dosing Inpatient   Does not apply q1800   sodium chloride, sodium chloride, acetaminophen **OR** acetaminophen, albuterol, alteplase, alum & mag hydroxide-simeth, guaifenesin, heparin, HYDROmorphone (DILAUDID) injection, lidocaine (PF), lidocaine-prilocaine, ondansetron, oxyCODONE, pentafluoroprop-tetrafluoroeth  Assessment/ Plan:  75 y.o. 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  ESRD on HD TTS;Admitted for angiogram of the right lower extremity with history of large popliteal aneurysm. -Patient due for hemodialysis today. Orders have an prepared.   2.  Anemia chronic kidney disease.  Patient received Epogen 10,000 units IV with dialysis today.  3.  Secondary hyperparathyroidism.  Check phosphorus during dialysis treatment today. Continue Renvela 800 mg by mouth 3 times a day with meals.  4.  Right popliteal artery aneurysm.  Status post angiogram and Viabahn stent graft placement.  Vascular surgery following closely.  Started on heparin.      LOS: 3 Shalayne Leach 12/2/20171:05 PM

## 2016-08-24 NOTE — Progress Notes (Signed)
Pt ended HD tx 30 minutes early AMA. No reason given. Alert, vss and no c/o. Report to Madlyn Frankel primary RN. 0.5 L removed.

## 2016-08-24 NOTE — Progress Notes (Signed)
Post hd assessment 

## 2016-08-24 NOTE — Progress Notes (Signed)
Post hd vitals 

## 2016-08-24 NOTE — Progress Notes (Signed)
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  Seen and examined in dialysis. No issues with dialysis. Patient without complaint. INR today 1.22.   Objective: Vitals:   08/24/16 1455 08/24/16 1525 08/24/16 1555 08/24/16 1605  BP: 108/73 113/66 114/61 113/65  Pulse: 90 91 88 92  Resp: 14 (!) 26 17 18   Temp:   98 F (36.7 C)   TempSrc:   Oral   SpO2: 100% 100% 100% 100%  Weight:   84 kg (185 lb 3 oz)   Height:        Intake/Output Summary (Last 24 hours) at 08/24/16 1629 Last data filed at 08/24/16 1555  Gross per 24 hour  Intake              240 ml  Output              406 ml  Net             -166 ml   Physical Exam: A&Ox3, NAD CV: RRR Pulmonary: CTA Bilaterally Abdomen: Soft, Nontender, Nondistended Groin: Access site without swelling or drainage Vascular: Right Lower Extremity - extremity / foot warm. Non-palpable pedal pulses   Laboratory: CBC    Component Value Date/Time   WBC 8.6 08/24/2016 0603   HGB 9.4 (L) 08/24/2016 0603   HGB 6.9 (L) 01/20/2015 0430   HCT 30.7 (L) 08/24/2016 0603   HCT 20.0 (L) 06/08/2015 1300   PLT 169 08/24/2016 0603   PLT 176 01/20/2015 0430   BMET    Component Value Date/Time   NA 139 05/31/2016 1121   NA 141 01/20/2015 0430   K 3.5 05/31/2016 1121   K 3.6 01/20/2015 0430   CL 96 (L) 05/31/2016  1121   CL 99 (L) 01/20/2015 0430   CO2 33 (H) 05/31/2016 1121   CO2 30 01/20/2015 0430   GLUCOSE 104 (H) 05/31/2016 1121   GLUCOSE 119 (H) 01/20/2015 0430   BUN 52 (H) 05/31/2016 1121   BUN 32 (H) 01/20/2015 0430   CREATININE 5.85 (H) 08/21/2016 2015   CREATININE 6.73 (H) 01/20/2015 0430   CALCIUM 8.5 (L) 05/31/2016 1121   CALCIUM 7.8 (L) 01/20/2015 0430   GFRNONAA 9 (L) 08/21/2016 2015   GFRNONAA 7 (L) 01/20/2015 0430   GFRAA 10 (L) 08/21/2016 2015   GFRAA 9 (L) 01/20/2015 0430   Assessment/Planning: The patient is a 75 year old male s/p POD #3exclusion of 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 2) INR is not theraputic (needs to be 2.0-3.0), will continue heparin  3) Coumadin 2.5mg  today 4) Check INR in AM 5) Expect Dispo Mon / Tuesday  Marcelle Overlie PA-C 08/24/2016 4:29 PM

## 2016-08-24 NOTE — Progress Notes (Signed)
PRE HD INFO 

## 2016-08-24 NOTE — Progress Notes (Signed)
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 Penicillins Hives, Itching and Swelling    Has patient had a PCN reaction causing immediate rash, facial/tongue/throat swelling, SOB or lightheadedness with hypotension: Yes Has patient had a PCN reaction causing severe rash involving mucus membranes or skin necrosis: No Has patient had a PCN reaction that required hospitalization No Has patient had a PCN reaction occurring within the last 10 years: No If all of the above answers are "NO", then may proceed with Cephalosporin use.    Patient Measurements: Height: 6\' 2"  (188 cm) Weight: 186 lb 8.2 oz (84.6 kg) IBW/kg (Calculated) : 82.2 Heparin Dosing Weight: 80 kg  Vital Signs: Temp: 98 F (36.7 C) (12/02 1235) Temp Source: Oral (12/02 1235) BP: 108/73 (12/02 1455) Pulse Rate: 90 (12/02 1455)  Labs:  Recent Labs  08/21/16 1820 08/21/16 2015  08/22/16 0956  08/23/16 0810 08/23/16 2013 08/24/16 0603 08/24/16 1519  HGB 10.7*  --   --  10.3*  --   --  9.8* 9.4*  --   HCT 34.5*  --   --  32.5*  --   --  31.7* 30.7*  --   PLT 287  --   --  201  --   --  168 169  --   APTT 37*  --   --   --   --   --   --   --   --   LABPROT 16.6*  --   --   --   --  14.6  --  15.5*  --   INR 1.33  --   --   --   --  1.13  --  1.22  --   HEPARINUNFRC  --   --   < > 0.17*  < > 0.35 0.35 0.21* 0.26*  CREATININE  --  5.85*  --   --   --   --   --   --   --   < > = values in this interval not displayed.  Estimated Creatinine Clearance: 12.9 mL/min (by C-G formula based on SCr of 5.85 mg/dL (H)).  Medical History: Past Medical History:  Diagnosis Date  . AAA (abdominal aortic aneurysm) ([place])   . Allergic rhinitis   . Anemia    a. baseline hgb ~ 8  . Bladder cancer ([place])   . Chronic combined systolic and diastolic CHF  (congestive heart failure) ([place])    a. echo 12/2014: EF 25-30%, anterior wall wall motion abnormalities; b. planned ischemic evaluation once patient is stable medically; c. echo 01/2015: EF 45-50%, no RWMA, mild AT, LA mildly dilated, mod pericardial effusion along LV free wall, no evidence of hemodynamic compromise  . Cognitive communication deficit   . COPD (chronic obstructive pulmonary disease) ([place])   . Dementia   . Detrusor sphincter dyssynergia   . Dysrhythmia   . ESRD on hemodialysis ([place])    a. Tuesday, Thursday, and Saturdays  . GERD (gastroesophageal reflux disease)   . History of septic shock    a. 01/2015  . History of small bowel obstruction    a. 01/2015  . Hydronephrosis   . Hypercholesteremia   . Hypertension   . 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 Incontinence   . Incontinence of urine   . Magnesium deficiency   . Mini stroke ([place])   .  Overactive bladder   . PAF (paroxysmal atrial fibrillation) ([place])    a. new onset 12/2014 in the setting of UTI, sepsis, hypotension, and anemia; b. not on long term anticoagulation given anemia; c. family aware of stroke risk, they are ok with this; d. on amiodarone   . Prostate cancer ([place])   . Stroke [facility])     Medications:  Prescriptions Prior to Admission  Medication Sig Dispense Refill Last Dose  . albuterol (PROVENTIL) (2.5 MG/3ML) 0.083% nebulizer solution Take 3 mLs (2.5 mg total) by nebulization every 4 (four) hours as needed for wheezing or shortness of breath. 75 mL 12 08/20/2016 at Unknown time  . Amino Acids-Protein Hydrolys (FEEDING SUPPLEMENT, PRO-STAT SUGAR FREE 64,) LIQD Take 30 mLs by mouth every evening.   08/20/2016 at Unknown time  . amiodarone (PACERONE) 200 MG tablet Take 1 tablet (200 mg total) by mouth daily. 30 tablet 5 08/20/2016 at Unknown time  . atorvastatin (LIPITOR) 40 MG tablet Take 40 mg by mouth at bedtime.   08/20/2016 at Unknown time  . budesonide-formoterol (SYMBICORT) 160-4.5 MCG/ACT inhaler Inhale 2 puffs  into the lungs 2 (two) times daily.   08/20/2016 at Unknown time  . carvedilol (COREG) 3.125 MG tablet Take 1 tablet (3.125 mg total) by mouth 2 (two) times daily with a meal. 60 tablet 2 08/20/2016 at Unknown time  . diclofenac sodium (VOLTAREN) 1 % GEL Apply 2 g topically 3 (three) times daily.    08/20/2016 at Unknown time  . ferrous sulfate 325 (65 FE) MG tablet Take 325 mg by mouth daily.   08/20/2016 at Unknown time  . folic acid (FOLVITE) A999333 MCG tablet Take 400 mcg by mouth daily.   08/20/2016 at Unknown time  . gabapentin (NEURONTIN) 100 MG capsule Take 100 mg by mouth at bedtime.    08/20/2016 at Unknown time  . guaiFENesin (MUCINEX) 600 MG 12 hr tablet Take 600 mg by mouth 2 (two) times daily. (Take every morning and at bedtime for cough)   08/20/2016 at Unknown time  . guaifenesin (ROBITUSSIN) 100 MG/5ML syrup Take 200 mg by mouth every 4 (four) hours as needed for cough.   Past Week at Unknown time  . ipratropium-albuterol (DUONEB) 0.5-2.5 (3) MG/3ML SOLN Take 3 mLs by nebulization 3 (three) times daily. May inhale an additional  45mls 3 times a day as needed for chronic obstructive pulmonary disease   08/20/2016 at Unknown time  . lamoTRIgine (LAMICTAL) 25 MG tablet Take 50 mg by mouth at bedtime. Start on July 14th.   08/20/2016 at Unknown time  . latanoprost (XALATAN) 0.005 % ophthalmic solution Place 1 drop into both eyes at bedtime.   08/20/2016 at Unknown time  . LORazepam (ATIVAN) 0.5 MG tablet Take 1 tablet (0.5 mg total) by mouth every 4 (four) hours as needed for anxiety. (Patient taking differently: Take 0.5 mg by mouth 2 (two) times daily. May take an additional 0.5mg  every 4 hours as needed for anxiety) 30 tablet 0 08/20/2016 at Unknown time  . magnesium oxide (MAG-OX) 400 MG tablet Take 1 tablet (400 mg total) by mouth every evening. 30 tablet 2 08/20/2016 at Unknown time  . omeprazole (PRILOSEC) 20 MG capsule Take 20 mg by mouth daily.   08/20/2016 at Unknown time  .  ondansetron (ZOFRAN) 8 MG tablet Take by mouth every 8 (eight) hours as needed for nausea.   08/20/2016 at Unknown time  . oxyCODONE (OXY IR/ROXICODONE) 5 MG immediate release tablet Take 1 tablet (5  mg total) by mouth every 6 (six) hours as needed for moderate pain or severe pain. 20 tablet 0 08/20/2016 at Unknown time  . OXYGEN Inhale 2-3 L into the lungs as needed. For shortness of breath, respiratory distress or O2 sats <90.   08/21/2016 at Unknown time  . sevelamer carbonate (RENVELA) 800 MG tablet Take 800 mg by mouth 3 (three) times daily.    08/20/2016 at Unknown time  . tiotropium (SPIRIVA) 18 MCG inhalation capsule Place 1 capsule (18 mcg total) into inhaler and inhale daily. 30 capsule 12 08/20/2016 at Unknown time  . [DISCONTINUED] acetaminophen (TYLENOL) 325 MG tablet Take 650 mg by mouth every 8 (eight) hours as needed. For general discomfort   Past Week at Unknown time   Scheduled:  . amiodarone  200 mg Oral Daily  . atorvastatin  40 mg Oral QHS  . carvedilol  3.125 mg Oral BID WC  . docusate sodium  100 mg Oral Daily  . epoetin (EPOGEN/PROCRIT) injection  10,000 Units Intravenous Q T,Th,Sa-HD  . feeding supplement (PRO-STAT SUGAR FREE 64)  30 mL Oral QPM  . ferrous sulfate  325 mg Oral Daily  . folic acid  [id number] mcg Oral Daily  . gabapentin  100 mg Oral QHS  . guaiFENesin  600 mg Oral BID  . ipratropium-albuterol  3 mL Nebulization TID  . lamoTRIgine  50 mg Oral QHS  . latanoprost  1 drop Both Eyes QHS  . magnesium oxide  400 mg Oral QPM  . mouth rinse  15 mL Mouth Rinse BID  . pantoprazole  40 mg Oral Daily  . sevelamer carbonate  800 mg Oral TID WC  . tiotropium  18 mcg Inhalation Daily  . warfarin  2.5 mg Oral q1800  . Warfarin - Physician Dosing Inpatient   Does not apply q1800   Infusions:  . heparin 1,800 Units/hr (08/24/16 0721)  . 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   Baseline labs have been ordered STAT.  11/29 Case was discussed with vascular surgeon Dr. Delana Meyer - gave the patient an initial bolus of 30 units/kg and target a HL of 0.3 - 0.5 units/mL  11/30 01:00 heparin level <0.1. 2400 unit bolus and increase rate to 1300 units/hr. Recheck in 8 hours.  Goal of Therapy:  Heparin level 0.3-0.5 units/ml Monitor platelets by anticoagulation protocol: Yes   Plan:  12/1 0800 HL =0.35 (goal 0.3-0.5)  RN called around 1030 stating that pt was bleeding from IV site. She turned drip off and removed IV-held pressure to site. Per Dr. Delana Meyer ok to resume drip at same rate (1650 units/hr) at different IV site once bleeding has stopped. RN to restart drip at 1100 Will recheck level in 8 hours @ 1900  12/1 20:00 heparin level 0.35. Continue current regimen. Recheck with tomorrow AM labs.  12/2 AM heparin level 0.21. 1200 unit bolus and increase rate to 1800 units/hr. Recheck in 8 hours.  12/2 PM Heparin level 0.26. Will increase drip heparin 1950 units/hr. Recheck level in 8 hours.   Pernell Dupre, PharmD, BCPS Clinical Pharmacist 08/24/2016 3:54 PM

## 2016-08-24 NOTE — Progress Notes (Signed)
  End of hd 

## 2016-08-24 NOTE — Plan of Care (Signed)
Problem: Acute Rehab PT Goals(only PT should resolve) Goal: Pt Will Transfer Bed To Chair/Chair To Bed Using a sliding board technique

## 2016-08-25 LAB — CBC
HCT: 30.1 % — ABNORMAL LOW (ref 40.0–52.0)
Hemoglobin: 9.2 g/dL — ABNORMAL LOW (ref 13.0–18.0)
MCH: 25.2 pg — AB (ref 26.0–34.0)
MCHC: 30.5 g/dL — ABNORMAL LOW (ref 32.0–36.0)
MCV: 82.5 fL (ref 80.0–100.0)
PLATELETS: 132 10*3/uL — AB (ref 150–440)
RBC: 3.65 MIL/uL — AB (ref 4.40–5.90)
RDW: 18.3 % — ABNORMAL HIGH (ref 11.5–14.5)
WBC: 8.7 10*3/uL (ref 3.8–10.6)

## 2016-08-25 LAB — PROTIME-INR
INR: 1.39
PROTHROMBIN TIME: 17.2 s — AB (ref 11.4–15.2)

## 2016-08-25 LAB — HEPARIN LEVEL (UNFRACTIONATED)
HEPARIN UNFRACTIONATED: 0.44 [IU]/mL (ref 0.30–0.70)
HEPARIN UNFRACTIONATED: 0.39 [IU]/mL (ref 0.30–0.70)
HEPARIN UNFRACTIONATED: 0.22 [IU]/mL — AB (ref 0.30–0.70)

## 2016-08-25 LAB — GLUCOSE, CAPILLARY
GLUCOSE-CAPILLARY: 133 mg/dL — AB (ref 65–99)
Glucose-Capillary: 86 mg/dL (ref 65–99)
Glucose-Capillary: 131 mg/dL — ABNORMAL HIGH (ref 65–99)

## 2016-08-25 MED ORDER — HEPARIN BOLUS VIA INFUSION
1200.0000 [IU] | Freq: Once | INTRAVENOUS | Status: AC
  Administered 2016-08-25: 1200 [IU] via INTRAVENOUS
  Filled 2016-08-25: qty 1200

## 2016-08-25 NOTE — Progress Notes (Signed)
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 Penicillins Hives, Itching and Swelling    Has patient had a PCN reaction causing immediate rash, facial/tongue/throat swelling, SOB or lightheadedness with hypotension: Yes Has patient had a PCN reaction causing severe rash involving mucus membranes or skin necrosis: No Has patient had a PCN reaction that required hospitalization No Has patient had a PCN reaction occurring within the last 10 years: No If all of the above answers are "NO", then may proceed with Cephalosporin use.    Patient Measurements: Height: 6\' 2"  (188 cm) Weight: 185 lb 3 oz (84 kg) IBW/kg (Calculated) : 82.2 Heparin Dosing Weight: 80 kg  Vital Signs: Temp: 98.7 F (37.1 C) (12/03 1930) Temp Source: Oral (12/03 1930) BP: 98/53 (12/03 1930) Pulse Rate: 79 (12/03 1930)  Labs:  Recent Labs  08/23/16 0810  08/23/16 2013 08/24/16 0603  08/25/16 0051 08/25/16 1228 08/25/16 2008  HGB  --   < > 9.8* 9.4*  --  9.2*  --   --   HCT  --   --  31.7* 30.7*  --  30.1*  --   --   PLT  --   --  168 169  --  132*  --   --   LABPROT 14.6  --   --  15.5*  --  17.2*  --   --   INR 1.13  --   --  1.22  --  1.39  --   --   HEPARINUNFRC 0.35  --  0.35 0.21*  < > 0.22* 0.44 0.39  < > = values in this interval not displayed.  Estimated Creatinine Clearance: 12.9 mL/min (by C-G formula based on SCr of 5.85 mg/dL (H)).  Medical History: Past Medical History:  Diagnosis Date  . AAA (abdominal aortic aneurysm) ([place])   . Allergic rhinitis   . Anemia    a. baseline hgb ~ 8  . Bladder cancer ([place])   . Chronic combined systolic and diastolic CHF (congestive heart failure) ([place])    a. echo 12/2014: EF 25-30%, anterior wall wall motion abnormalities; b. planned ischemic evaluation once patient is stable medically; c. echo  01/2015: EF 45-50%, no RWMA, mild AT, LA mildly dilated, mod pericardial effusion along LV free wall, no evidence of hemodynamic compromise  . Cognitive communication deficit   . COPD (chronic obstructive pulmonary disease) ([place])   . Dementia   . Detrusor sphincter dyssynergia   . Dysrhythmia   . ESRD on hemodialysis ([place])    a. Tuesday, Thursday, and Saturdays  . GERD (gastroesophageal reflux disease)   . History of septic shock    a. 01/2015  . History of small bowel obstruction    a. 01/2015  . Hydronephrosis   . Hypercholesteremia   . Hypertension   . 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 Incontinence   . Incontinence of urine   . Magnesium deficiency   . Mini stroke ([place])   . Overactive bladder   . PAF (paroxysmal atrial fibrillation) ([place])    a. new onset 12/2014 in the setting of UTI, sepsis, hypotension, and anemia; b. not on long term anticoagulation given anemia; c. family aware of stroke risk, they are ok with this; d. on amiodarone   . Prostate cancer ([place])   . Stroke [facility])     Medications:  Prescriptions Prior to  Admission  Medication Sig Dispense Refill Last Dose  . albuterol (PROVENTIL) (2.5 MG/3ML) 0.083% nebulizer solution Take 3 mLs (2.5 mg total) by nebulization every 4 (four) hours as needed for wheezing or shortness of breath. 75 mL 12 08/20/2016 at Unknown time  . Amino Acids-Protein Hydrolys (FEEDING SUPPLEMENT, PRO-STAT SUGAR FREE 64,) LIQD Take 30 mLs by mouth every evening.   08/20/2016 at Unknown time  . amiodarone (PACERONE) 200 MG tablet Take 1 tablet (200 mg total) by mouth daily. 30 tablet 5 08/20/2016 at Unknown time  . atorvastatin (LIPITOR) 40 MG tablet Take 40 mg by mouth at bedtime.   08/20/2016 at Unknown time  . budesonide-formoterol (SYMBICORT) 160-4.5 MCG/ACT inhaler Inhale 2 puffs into the lungs 2 (two) times daily.   08/20/2016 at Unknown time  . carvedilol (COREG) 3.125 MG tablet Take 1 tablet (3.125 mg total) by mouth 2 (two) times daily with a meal. 60  tablet 2 08/20/2016 at Unknown time  . diclofenac sodium (VOLTAREN) 1 % GEL Apply 2 g topically 3 (three) times daily.    08/20/2016 at Unknown time  . ferrous sulfate 325 (65 FE) MG tablet Take 325 mg by mouth daily.   08/20/2016 at Unknown time  . folic acid (FOLVITE) A999333 MCG tablet Take 400 mcg by mouth daily.   08/20/2016 at Unknown time  . gabapentin (NEURONTIN) 100 MG capsule Take 100 mg by mouth at bedtime.    08/20/2016 at Unknown time  . guaiFENesin (MUCINEX) 600 MG 12 hr tablet Take 600 mg by mouth 2 (two) times daily. (Take every morning and at bedtime for cough)   08/20/2016 at Unknown time  . guaifenesin (ROBITUSSIN) 100 MG/5ML syrup Take 200 mg by mouth every 4 (four) hours as needed for cough.   Past Week at Unknown time  . ipratropium-albuterol (DUONEB) 0.5-2.5 (3) MG/3ML SOLN Take 3 mLs by nebulization 3 (three) times daily. May inhale an additional  80mls 3 times a day as needed for chronic obstructive pulmonary disease   08/20/2016 at Unknown time  . lamoTRIgine (LAMICTAL) 25 MG tablet Take 50 mg by mouth at bedtime. Start on July 14th.   08/20/2016 at Unknown time  . latanoprost (XALATAN) 0.005 % ophthalmic solution Place 1 drop into both eyes at bedtime.   08/20/2016 at Unknown time  . LORazepam (ATIVAN) 0.5 MG tablet Take 1 tablet (0.5 mg total) by mouth every 4 (four) hours as needed for anxiety. (Patient taking differently: Take 0.5 mg by mouth 2 (two) times daily. May take an additional 0.5mg  every 4 hours as needed for anxiety) 30 tablet 0 08/20/2016 at Unknown time  . magnesium oxide (MAG-OX) 400 MG tablet Take 1 tablet (400 mg total) by mouth every evening. 30 tablet 2 08/20/2016 at Unknown time  . omeprazole (PRILOSEC) 20 MG capsule Take 20 mg by mouth daily.   08/20/2016 at Unknown time  . ondansetron (ZOFRAN) 8 MG tablet Take by mouth every 8 (eight) hours as needed for nausea.   08/20/2016 at Unknown time  . oxyCODONE (OXY IR/ROXICODONE) 5 MG immediate release tablet Take  1 tablet (5 mg total) by mouth every 6 (six) hours as needed for moderate pain or severe pain. 20 tablet 0 08/20/2016 at Unknown time  . OXYGEN Inhale 2-3 L into the lungs as needed. For shortness of breath, respiratory distress or O2 sats <90.   08/21/2016 at Unknown time  . sevelamer carbonate (RENVELA) 800 MG tablet Take 800 mg by mouth 3 (three) times daily.  08/20/2016 at Unknown time  . tiotropium (SPIRIVA) 18 MCG inhalation capsule Place 1 capsule (18 mcg total) into inhaler and inhale daily. 30 capsule 12 08/20/2016 at Unknown time  . [DISCONTINUED] acetaminophen (TYLENOL) 325 MG tablet Take 650 mg by mouth every 8 (eight) hours as needed. For general discomfort   Past Week at Unknown time   Scheduled:  . amiodarone  200 mg Oral Daily  . atorvastatin  40 mg Oral QHS  . carvedilol  3.125 mg Oral BID WC  . docusate sodium  100 mg Oral Daily  . epoetin (EPOGEN/PROCRIT) injection  10,000 Units Intravenous Q T,Th,Sa-HD  . feeding supplement (PRO-STAT SUGAR FREE 64)  30 mL Oral QPM  . ferrous sulfate  325 mg Oral Daily  . folic acid  [id number] mcg Oral Daily  . gabapentin  100 mg Oral QHS  . guaiFENesin  600 mg Oral BID  . ipratropium-albuterol  3 mL Nebulization TID  . lamoTRIgine  50 mg Oral QHS  . latanoprost  1 drop Both Eyes QHS  . magnesium oxide  400 mg Oral QPM  . mouth rinse  15 mL Mouth Rinse BID  . pantoprazole  40 mg Oral Daily  . sevelamer carbonate  800 mg Oral TID WC  . tiotropium  18 mcg Inhalation Daily  . Warfarin - Physician Dosing Inpatient   Does not apply q1800   Infusions:  . heparin 2,100 Units/hr (08/25/16 1057)  . 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   Baseline labs have been ordered STAT.  11/29 Case was discussed with vascular surgeon Dr. Delana Meyer - gave the patient  an initial bolus of 30 units/kg and target a HL of 0.3 - 0.5 units/mL  11/30 01:00 heparin level <0.1. 2400 unit bolus and increase rate to 1300 units/hr. Recheck in 8 hours.  Goal of Therapy:  Heparin level 0.3-0.5 units/ml Monitor platelets by anticoagulation protocol: Yes   Plan:  12/1 0800 HL =0.35 (goal 0.3-0.5)  RN called around 1030 stating that pt was bleeding from IV site. She turned drip off and removed IV-held pressure to site. Per Dr. Delana Meyer ok to resume drip at same rate (1650 units/hr) at different IV site once bleeding has stopped. RN to restart drip at 1100 Will recheck level in 8 hours @ 1900  12/1 20:00 heparin level 0.35. Continue current regimen. Recheck with tomorrow AM labs.  12/2 AM heparin level 0.21. 1200 unit bolus and increase rate to 1800 units/hr. Recheck in 8 hours.  12/2 PM Heparin level 0.26. Will increase drip heparin 1950 units/hr. Recheck level in 8 hours.   12/3 01:00 heparin level 0.22. 1200 unit bolus and increase rate to 2100 units/hr. Recheck in 8 hours.  12/3 @ 12:28  HL = 0.44. Continue current drip rate. Recheck in 8 hours at 20:30.  12/3 @ 2008 HL= 0.39 Continue current drip rate. Recheck HL with am labs.   Pernell Dupre, PharmD, BCPS Clinical Pharmacist 08/25/2016 9:07 PM

## 2016-08-25 NOTE — Progress Notes (Signed)
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 Penicillins Hives, Itching and Swelling    Has patient had a PCN reaction causing immediate rash, facial/tongue/throat swelling, SOB or lightheadedness with hypotension: Yes Has patient had a PCN reaction causing severe rash involving mucus membranes or skin necrosis: No Has patient had a PCN reaction that required hospitalization No Has patient had a PCN reaction occurring within the last 10 years: No If all of the above answers are "NO", then may proceed with Cephalosporin use.    Patient Measurements: Height: 6\' 2"  (188 cm) Weight: 185 lb 3 oz (84 kg) IBW/kg (Calculated) : 82.2 Heparin Dosing Weight: 80 kg  Vital Signs: Temp: 98.3 F (36.8 C) (12/03 0807) Temp Source: Oral (12/03 0807) BP: 107/64 (12/03 0807) Pulse Rate: 75 (12/03 0807)  Labs:  Recent Labs  08/23/16 0810  08/23/16 2013 08/24/16 0603 08/24/16 1519 08/25/16 0051 08/25/16 1228  HGB  --   < > 9.8* 9.4*  --  9.2*  --   HCT  --   --  31.7* 30.7*  --  30.1*  --   PLT  --   --  168 169  --  132*  --   LABPROT 14.6  --   --  15.5*  --  17.2*  --   INR 1.13  --   --  1.22  --  1.39  --   HEPARINUNFRC 0.35  --  0.35 0.21* 0.26* 0.22* 0.44  < > = values in this interval not displayed.  Estimated Creatinine Clearance: 12.9 mL/min (by C-G formula based on SCr of 5.85 mg/dL (H)).  Medical History: Past Medical History:  Diagnosis Date  . AAA (abdominal aortic aneurysm) ([place])   . Allergic rhinitis   . Anemia    a. baseline hgb ~ 8  . Bladder cancer ([place])   . Chronic combined systolic and diastolic CHF (congestive heart failure) ([place])    a. echo 12/2014: EF 25-30%, anterior wall wall motion abnormalities; b. planned ischemic evaluation once patient is stable medically; c. echo 01/2015: EF 45-50%, no RWMA,  mild AT, LA mildly dilated, mod pericardial effusion along LV free wall, no evidence of hemodynamic compromise  . Cognitive communication deficit   . COPD (chronic obstructive pulmonary disease) ([place])   . Dementia   . Detrusor sphincter dyssynergia   . Dysrhythmia   . ESRD on hemodialysis ([place])    a. Tuesday, Thursday, and Saturdays  . GERD (gastroesophageal reflux disease)   . History of septic shock    a. 01/2015  . History of small bowel obstruction    a. 01/2015  . Hydronephrosis   . Hypercholesteremia   . Hypertension   . 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)   . Incontinence   . Incontinence of urine   . Magnesium deficiency   . Mini stroke ([place])   . Overactive bladder   . PAF (paroxysmal atrial fibrillation) ([place])    a. new onset 12/2014 in the setting of UTI, sepsis, hypotension, and anemia; b. not on long term anticoagulation given anemia; c. family aware of stroke risk, they are ok with this; d. on amiodarone   . Prostate cancer ([place])   . Stroke [facility])     Medications:  Prescriptions Prior to Admission  Medication Sig Dispense Refill Last Dose  . albuterol (PROVENTIL) (2.5 MG/3ML) 0.083% nebulizer solution Take 3  mLs (2.5 mg total) by nebulization every 4 (four) hours as needed for wheezing or shortness of breath. 75 mL 12 08/20/2016 at Unknown time  . Amino Acids-Protein Hydrolys (FEEDING SUPPLEMENT, PRO-STAT SUGAR FREE 64,) LIQD Take 30 mLs by mouth every evening.   08/20/2016 at Unknown time  . amiodarone (PACERONE) 200 MG tablet Take 1 tablet (200 mg total) by mouth daily. 30 tablet 5 08/20/2016 at Unknown time  . atorvastatin (LIPITOR) 40 MG tablet Take 40 mg by mouth at bedtime.   08/20/2016 at Unknown time  . budesonide-formoterol (SYMBICORT) 160-4.5 MCG/ACT inhaler Inhale 2 puffs into the lungs 2 (two) times daily.   08/20/2016 at Unknown time  . carvedilol (COREG) 3.125 MG tablet Take 1 tablet (3.125 mg total) by mouth 2 (two) times daily with a meal. 60 tablet 2 08/20/2016 at  Unknown time  . diclofenac sodium (VOLTAREN) 1 % GEL Apply 2 g topically 3 (three) times daily.    08/20/2016 at Unknown time  . ferrous sulfate 325 (65 FE) MG tablet Take 325 mg by mouth daily.   08/20/2016 at Unknown time  . folic acid (FOLVITE) [id number] MCG tablet Take 400 mcg by mouth daily.   08/20/2016 at Unknown time  . gabapentin (NEURONTIN) 100 MG capsule Take 100 mg by mouth at bedtime.    08/20/2016 at Unknown time  . guaiFENesin (MUCINEX) 600 MG 12 hr tablet Take 600 mg by mouth 2 (two) times daily. (Take every morning and at bedtime for cough)   08/20/2016 at Unknown time  . guaifenesin (ROBITUSSIN) 100 MG/5ML syrup Take 200 mg by mouth every 4 (four) hours as needed for cough.   Past Week at Unknown time  . ipratropium-albuterol (DUONEB) 0.5-2.5 (3) MG/3ML SOLN Take 3 mLs by nebulization 3 (three) times daily. May inhale an additional  75mls 3 times a day as needed for chronic obstructive pulmonary disease   08/20/2016 at Unknown time  . lamoTRIgine (LAMICTAL) 25 MG tablet Take 50 mg by mouth at bedtime. Start on July 14th.   08/20/2016 at Unknown time  . latanoprost (XALATAN) 0.005 % ophthalmic solution Place 1 drop into both eyes at bedtime.   08/20/2016 at Unknown time  . LORazepam (ATIVAN) 0.5 MG tablet Take 1 tablet (0.5 mg total) by mouth every 4 (four) hours as needed for anxiety. (Patient taking differently: Take 0.5 mg by mouth 2 (two) times daily. May take an additional 0.5mg  every 4 hours as needed for anxiety) 30 tablet 0 08/20/2016 at Unknown time  . magnesium oxide (MAG-OX) 400 MG tablet Take 1 tablet (400 mg total) by mouth every evening. 30 tablet 2 08/20/2016 at Unknown time  . omeprazole (PRILOSEC) 20 MG capsule Take 20 mg by mouth daily.   08/20/2016 at Unknown time  . ondansetron (ZOFRAN) 8 MG tablet Take by mouth every 8 (eight) hours as needed for nausea.   08/20/2016 at Unknown time  . oxyCODONE (OXY IR/ROXICODONE) 5 MG immediate release tablet Take 1 tablet (5 mg total)  by mouth every 6 (six) hours as needed for moderate pain or severe pain. 20 tablet 0 08/20/2016 at Unknown time  . OXYGEN Inhale 2-3 L into the lungs as needed. For shortness of breath, respiratory distress or O2 sats <90.   08/21/2016 at Unknown time  . sevelamer carbonate (RENVELA) 800 MG tablet Take 800 mg by mouth 3 (three) times daily.    08/20/2016 at Unknown time  . tiotropium (SPIRIVA) 18 MCG inhalation capsule Place 1 capsule (18 mcg  total) into inhaler and inhale daily. 30 capsule 12 08/20/2016 at Unknown time  . [DISCONTINUED] acetaminophen (TYLENOL) 325 MG tablet Take 650 mg by mouth every 8 (eight) hours as needed. For general discomfort   Past Week at Unknown time   Scheduled:  . amiodarone  200 mg Oral Daily  . atorvastatin  40 mg Oral QHS  . carvedilol  3.125 mg Oral BID WC  . docusate sodium  100 mg Oral Daily  . epoetin (EPOGEN/PROCRIT) injection  10,000 Units Intravenous Q T,Th,Sa-HD  . feeding supplement (PRO-STAT SUGAR FREE 64)  30 mL Oral QPM  . ferrous sulfate  325 mg Oral Daily  . folic acid  [id number] mcg Oral Daily  . gabapentin  100 mg Oral QHS  . guaiFENesin  600 mg Oral BID  . ipratropium-albuterol  3 mL Nebulization TID  . lamoTRIgine  50 mg Oral QHS  . latanoprost  1 drop Both Eyes QHS  . magnesium oxide  400 mg Oral QPM  . mouth rinse  15 mL Mouth Rinse BID  . pantoprazole  40 mg Oral Daily  . sevelamer carbonate  800 mg Oral TID WC  . tiotropium  18 mcg Inhalation Daily  . warfarin  2.5 mg Oral q1800  . Warfarin - Physician Dosing Inpatient   Does not apply q1800   Infusions:  . heparin 2,100 Units/hr (08/25/16 1057)  . 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   Baseline labs have been ordered STAT.  11/29 Case was discussed with vascular surgeon Dr. Delana Meyer - gave  the patient an initial bolus of 30 units/kg and target a HL of 0.3 - 0.5 units/mL  11/30 01:00 heparin level <0.1. 2400 unit bolus and increase rate to 1300 units/hr. Recheck in 8 hours.  Goal of Therapy:  Heparin level 0.3-0.5 units/ml Monitor platelets by anticoagulation protocol: Yes   Plan:  12/1 0800 HL =0.35 (goal 0.3-0.5)  RN called around 1030 stating that pt was bleeding from IV site. She turned drip off and removed IV-held pressure to site. Per Dr. Delana Meyer ok to resume drip at same rate (1650 units/hr) at different IV site once bleeding has stopped. RN to restart drip at 1100 Will recheck level in 8 hours @ 1900  12/1 20:00 heparin level 0.35. Continue current regimen. Recheck with tomorrow AM labs.  12/2 AM heparin level 0.21. 1200 unit bolus and increase rate to 1800 units/hr. Recheck in 8 hours.  12/2 PM Heparin level 0.26. Will increase drip heparin 1950 units/hr. Recheck level in 8 hours.   12/3 01:00 heparin level 0.22. 1200 unit bolus and increase rate to 2100 units/hr. Recheck in 8 hours.  12/3 @ 12:28  HL = 0.44. Continue current drip rate. Recheck in 8 hours at 20:30.  Olivia Canter, [place] Clinical Pharmacist 08/25/2016 1:09 PM

## 2016-08-25 NOTE — Progress Notes (Signed)
[facility]  ROUNDING NOTE   Subjective:  Patient completed hemodialysis yesterday. He remains on heparin drip. Resting comfortably.   Objective:  Vital signs in last 24 hours:  Temp:  [97.8 F (36.6 C)-98.3 F (36.8 C)] 98.3 F (36.8 C) (12/03 0807) Pulse Rate:  [70-92] 75 (12/03 0807) Resp:  [14-26] 18 (12/03 0807) BP: (93-114)/(47-73) 107/64 (12/03 0807) SpO2:  [97 %-100 %] 99 % (12/03 0807) Weight:  [84 kg (185 lb 3 oz)] 84 kg (185 lb 3 oz) (12/02 1555)  Weight change:  Filed Weights   08/22/16 1952 08/24/16 1235 08/24/16 1555  Weight: 80.6 kg (177 lb 11.1 oz) 84.6 kg (186 lb 8.2 oz) 84 kg (185 lb 3 oz)    Intake/Output: I/O last 3 completed shifts: In: 240 [P.O.:240] Out: 606 [Urine:200; Other:406]   Intake/Output this shift:  No intake/output data recorded.  Physical Exam: General: No acute distress  Head: Normocephalic, atraumatic. Moist oral mucosal membranes  Eyes: Anicteric  Neck: Supple, trachea midline  Lungs:  Scattered rhonchi, normal effort  Heart: S1S2 irregular  Abdomen:  Soft, nontender, bowel sounds present   Extremities: trace peripheral edema.  Neurologic: Nonfocal, moving all four extremities  Skin: No lesions  Access: LUE AVF    Basic Metabolic Panel:  Recent Labs Lab 08/21/16 2015 08/24/16 0603  CREATININE 5.85*  --   PHOS  --  4.4    Liver Function Tests: No results for input(s): AST, ALT, ALKPHOS, BILITOT, PROT, ALBUMIN in the last 168 hours. No results for input(s): LIPASE, AMYLASE in the last 168 hours. No results for input(s): AMMONIA in the last 168 hours.  CBC:  Recent Labs Lab 08/21/16 1820 08/22/16 0956 08/23/16 2013 08/24/16 0603 08/25/16 0051  WBC 17.2* 8.5 7.2 8.6 8.7  HGB 10.7* 10.3* 9.8* 9.4* 9.2*  HCT 34.5* 32.5* 31.7* 30.7* 30.1*  MCV 82.6 81.1 83.8 82.5 82.5  PLT 287 201 168 169 132*    Cardiac Enzymes: No results for input(s): CKTOTAL, CKMB, CKMBINDEX, TROPONINI in the last 168  hours.  BNP: Invalid input(s): POCBNP  CBG:  Recent Labs Lab 08/22/16 0742 08/22/16 1129 08/22/16 1557 08/25/16 0748 08/25/16 1247  GLUCAP 139* 134* 132* 29 133*    Microbiology: Results for orders placed or performed during the hospital encounter of 08/21/16  MRSA PCR Screening     Status: None   Collection Time: 08/21/16 11:00 AM  Result Value Ref Range Status   MRSA by PCR NEGATIVE NEGATIVE Final    Comment:        The GeneXpert MRSA Assay (FDA approved for NASAL specimens only), is one component of a comprehensive MRSA colonization surveillance program. It is not intended to diagnose MRSA infection nor to guide or monitor treatment for MRSA infections.     Coagulation Studies:  Recent Labs  08/23/16 0810 08/24/16 0603 08/25/16 0051  LABPROT 14.6 15.5* 17.2*  INR 1.13 1.22 1.39    Urinalysis: No results for input(s): COLORURINE, LABSPEC, PHURINE, GLUCOSEU, HGBUR, BILIRUBINUR, KETONESUR, PROTEINUR, UROBILINOGEN, NITRITE, LEUKOCYTESUR in the last 72 hours.  Invalid input(s): APPERANCEUR    Imaging: No results found.   Medications:   . heparin 2,100 Units/hr (08/25/16 1057)  . phenylephrine (NEO-SYNEPHRINE) Adult infusion Stopped (08/22/16 0701)   . amiodarone  200 mg Oral Daily  . atorvastatin  40 mg Oral QHS  . carvedilol  3.125 mg Oral BID WC  . docusate sodium  100 mg Oral Daily  . epoetin (EPOGEN/PROCRIT) injection  10,000 Units Intravenous Q  T,Th,Sa-HD  . feeding supplement (PRO-STAT SUGAR FREE 64)  30 mL Oral QPM  . ferrous sulfate  325 mg Oral Daily  . folic acid  [id number] mcg Oral Daily  . gabapentin  100 mg Oral QHS  . guaiFENesin  600 mg Oral BID  . ipratropium-albuterol  3 mL Nebulization TID  . lamoTRIgine  50 mg Oral QHS  . latanoprost  1 drop Both Eyes QHS  . magnesium oxide  400 mg Oral QPM  . mouth rinse  15 mL Mouth Rinse BID  . pantoprazole  40 mg Oral Daily  . sevelamer carbonate  800 mg Oral TID WC  . tiotropium  18 mcg  Inhalation Daily  . warfarin  2.5 mg Oral q1800  . Warfarin - Physician Dosing Inpatient   Does not apply q1800   sodium chloride, sodium chloride, acetaminophen **OR** acetaminophen, albuterol, alteplase, alum & mag hydroxide-simeth, guaifenesin, heparin, HYDROmorphone (DILAUDID) injection, lidocaine (PF), lidocaine-prilocaine, ondansetron, oxyCODONE, pentafluoroprop-tetrafluoroeth  Assessment/ Plan:  75 y.o. 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  ESRD on HD TTS;Admitted for angiogram of the right lower extremity with history of large popliteal aneurysm. -Patient underwent hemodialysis yesterday which she tolerated well. No acute indication for dialysis today. We will plan for dialysis again on Tuesday.   2.  Anemia chronic kidney disease.  Hemoglobin currently 9.2. Continue Epogen 10,000 units IV with dialysis.  3.  Secondary hyperparathyroidism.  Phosphorus at target of 4.4. Continue Renvela 800 mg by mouth 3 times a day with meals.  4.  Right popliteal artery aneurysm.  Status post angiogram and Viabahn stent graft placement.  Vascular surgery following closely.  Started on heparin and being transitioned to warfarin, pharmacy following.       LOS: 4 [name] 12/3/20171:21 PM

## 2016-08-25 NOTE — Progress Notes (Signed)
[facility]  Daily Progress Note   Subjective: 4 Days Post-Op: Introduction catheter into rightlower extremity 3rd order catheter placement with additional third order, Contrast injection rightlower extremity for distal runoff, Percutaneous transluminal angioplastyand stent placement for exclusion of distal SFA and popliteal artery aneurysm with Viabahn stent grafts, Percutaneous transluminal angioplasty to 3 mm right posterior tibial artery, Percutaneous transluminal angioplasty to 2 mm right peroneal artery, Mechanical thrombus extraction using the penumbra catheter right popliteal distally with pre-close closure right superficial femoral femoral arteriotomy.  Patient complaining his legs are "stiff" this afternoon. Right lower extremity sore, but not as painful as before intervention. HD went well yesterday. INR is not therapeutic yet.   Objective: Vitals:   08/25/16 0430 08/25/16 0807 08/25/16 1407 08/25/16 1414  BP: (!) 101/51 107/64  (!) 99/49  Pulse: 70 75  72  Resp: 18 18  18   Temp: 97.8 F (36.6 C) 98.3 F (36.8 C)    TempSrc:  Oral    SpO2: 100% 99% 98% 100%  Weight:      Height:        Intake/Output Summary (Last 24 hours) at 08/25/16 1447 Last data filed at 08/25/16 1200  Gross per 24 hour  Intake              240 ml  Output              606 ml  Net             -366 ml   Physical Exam: A&Ox3, NAD CV: RRR Pulmonary: CTA Bilaterally Abdomen: Soft, Nontender, Nondistended Groin: Access site without swelling or drainage Vascular: Right Lower Extremity - extremity / foot warm. Non-palpable pedal pulses   Laboratory: CBC    Component Value Date/Time   WBC 8.7 08/25/2016 0051   HGB 9.2 (L) 08/25/2016 0051   HGB 6.9 (L) 01/20/2015 0430   HCT 30.1 (L) 08/25/2016 0051   HCT 20.0 (L) 06/08/2015 1300   PLT 132 (L) 08/25/2016 0051   PLT 176 01/20/2015 0430   BMET    Component Value Date/Time   NA 139 05/31/2016 1121   NA 141 01/20/2015  0430   K 3.5 05/31/2016 1121   K 3.6 01/20/2015 0430   CL 96 (L) 05/31/2016 1121   CL 99 (L) 01/20/2015 0430   CO2 33 (H) 05/31/2016 1121   CO2 30 01/20/2015 0430   GLUCOSE 104 (H) 05/31/2016 1121   GLUCOSE 119 (H) 01/20/2015 0430   BUN 52 (H) 05/31/2016 1121   BUN 32 (H) 01/20/2015 0430   CREATININE 5.85 (H) 08/21/2016 2015   CREATININE 6.73 (H) 01/20/2015 0430   CALCIUM 8.5 (L) 05/31/2016 1121   CALCIUM 7.8 (L) 01/20/2015 0430   GFRNONAA 9 (L) 08/21/2016 2015   GFRNONAA 7 (L) 01/20/2015 0430   GFRAA 10 (L) 08/21/2016 2015   GFRAA 9 (L) 01/20/2015 0430   Assessment/Planning: The patient is a 75 year old male s/p POD #3exclusion of 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 2) INR is not theraputic today 1.39 (needs to be 2.0-3.0), will continue heparin  3) Coumadin 2.5mg  today 4) Check INR in AM 5) Expect Dispo Mon / Tuesday  Marcelle Overlie PA-C 08/25/2016 2:47 PM

## 2016-08-25 NOTE — Progress Notes (Signed)
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 Penicillins Hives, Itching and Swelling    Has patient had a PCN reaction causing immediate rash, facial/tongue/throat swelling, SOB or lightheadedness with hypotension: Yes Has patient had a PCN reaction causing severe rash involving mucus membranes or skin necrosis: No Has patient had a PCN reaction that required hospitalization No Has patient had a PCN reaction occurring within the last 10 years: No If all of the above answers are "NO", then may proceed with Cephalosporin use.    Patient Measurements: Height: 6\' 2"  (188 cm) Weight: 185 lb 3 oz (84 kg) IBW/kg (Calculated) : 82.2 Heparin Dosing Weight: 80 kg  Vital Signs: Temp: 98.2 F (36.8 C) (12/02 1926) Temp Source: Oral (12/02 1926) BP: 98/47 (12/02 1926) Pulse Rate: 83 (12/02 1926)  Labs:  Recent Labs  08/23/16 0810 08/23/16 2013 08/24/16 0603 08/24/16 1519 08/25/16 0051  HGB  --  9.8* 9.4*  --  9.2*  HCT  --  31.7* 30.7*  --  30.1*  PLT  --  168 169  --  132*  LABPROT 14.6  --  15.5*  --  17.2*  INR 1.13  --  1.22  --  1.39  HEPARINUNFRC 0.35 0.35 0.21* 0.26* 0.22*    Estimated Creatinine Clearance: 12.9 mL/min (by C-G formula based on SCr of 5.85 mg/dL (H)).  Medical History: Past Medical History:  Diagnosis Date  . AAA (abdominal aortic aneurysm) ([place])   . Allergic rhinitis   . Anemia    a. baseline hgb ~ 8  . Bladder cancer ([place])   . Chronic combined systolic and diastolic CHF (congestive heart failure) ([place])    a. echo 12/2014: EF 25-30%, anterior wall wall motion abnormalities; b. planned ischemic evaluation once patient is stable medically; c. echo 01/2015: EF 45-50%, no RWMA, mild AT, LA mildly dilated, mod pericardial effusion along LV free wall, no evidence of hemodynamic compromise  .  Cognitive communication deficit   . COPD (chronic obstructive pulmonary disease) ([place])   . Dementia   . Detrusor sphincter dyssynergia   . Dysrhythmia   . ESRD on hemodialysis ([place])    a. Tuesday, Thursday, and Saturdays  . GERD (gastroesophageal reflux disease)   . History of septic shock    a. 01/2015  . History of small bowel obstruction    a. 01/2015  . Hydronephrosis   . Hypercholesteremia   . Hypertension   . 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 Incontinence   . Incontinence of urine   . Magnesium deficiency   . Mini stroke ([place])   . Overactive bladder   . PAF (paroxysmal atrial fibrillation) ([place])    a. new onset 12/2014 in the setting of UTI, sepsis, hypotension, and anemia; b. not on long term anticoagulation given anemia; c. family aware of stroke risk, they are ok with this; d. on amiodarone   . Prostate cancer ([place])   . Stroke [facility])     Medications:  Prescriptions Prior to Admission  Medication Sig Dispense Refill Last Dose  . albuterol (PROVENTIL) (2.5 MG/3ML) 0.083% nebulizer solution Take 3 mLs (2.5 mg total) by nebulization every 4 (four) hours as needed for wheezing or shortness of breath. 75 mL 12 08/20/2016 at Unknown time  . Amino Acids-Protein Hydrolys (FEEDING SUPPLEMENT, PRO-STAT SUGAR FREE 64,) LIQD Take 30 mLs by mouth every evening.  08/20/2016 at Unknown time  . amiodarone (PACERONE) 200 MG tablet Take 1 tablet (200 mg total) by mouth daily. 30 tablet 5 08/20/2016 at Unknown time  . atorvastatin (LIPITOR) 40 MG tablet Take 40 mg by mouth at bedtime.   08/20/2016 at Unknown time  . budesonide-formoterol (SYMBICORT) 160-4.5 MCG/ACT inhaler Inhale 2 puffs into the lungs 2 (two) times daily.   08/20/2016 at Unknown time  . carvedilol (COREG) 3.125 MG tablet Take 1 tablet (3.125 mg total) by mouth 2 (two) times daily with a meal. 60 tablet 2 08/20/2016 at Unknown time  . diclofenac sodium (VOLTAREN) 1 % GEL Apply 2 g topically 3 (three) times daily.    08/20/2016 at  Unknown time  . ferrous sulfate 325 (65 FE) MG tablet Take 325 mg by mouth daily.   08/20/2016 at Unknown time  . folic acid (FOLVITE) [id number] MCG tablet Take 400 mcg by mouth daily.   08/20/2016 at Unknown time  . gabapentin (NEURONTIN) 100 MG capsule Take 100 mg by mouth at bedtime.    08/20/2016 at Unknown time  . guaiFENesin (MUCINEX) 600 MG 12 hr tablet Take 600 mg by mouth 2 (two) times daily. (Take every morning and at bedtime for cough)   08/20/2016 at Unknown time  . guaifenesin (ROBITUSSIN) 100 MG/5ML syrup Take 200 mg by mouth every 4 (four) hours as needed for cough.   Past Week at Unknown time  . ipratropium-albuterol (DUONEB) 0.5-2.5 (3) MG/3ML SOLN Take 3 mLs by nebulization 3 (three) times daily. May inhale an additional  49mls 3 times a day as needed for chronic obstructive pulmonary disease   08/20/2016 at Unknown time  . lamoTRIgine (LAMICTAL) 25 MG tablet Take 50 mg by mouth at bedtime. Start on July 14th.   08/20/2016 at Unknown time  . latanoprost (XALATAN) 0.005 % ophthalmic solution Place 1 drop into both eyes at bedtime.   08/20/2016 at Unknown time  . LORazepam (ATIVAN) 0.5 MG tablet Take 1 tablet (0.5 mg total) by mouth every 4 (four) hours as needed for anxiety. (Patient taking differently: Take 0.5 mg by mouth 2 (two) times daily. May take an additional 0.5mg  every 4 hours as needed for anxiety) 30 tablet 0 08/20/2016 at Unknown time  . magnesium oxide (MAG-OX) 400 MG tablet Take 1 tablet (400 mg total) by mouth every evening. 30 tablet 2 08/20/2016 at Unknown time  . omeprazole (PRILOSEC) 20 MG capsule Take 20 mg by mouth daily.   08/20/2016 at Unknown time  . ondansetron (ZOFRAN) 8 MG tablet Take by mouth every 8 (eight) hours as needed for nausea.   08/20/2016 at Unknown time  . oxyCODONE (OXY IR/ROXICODONE) 5 MG immediate release tablet Take 1 tablet (5 mg total) by mouth every 6 (six) hours as needed for moderate pain or severe pain. 20 tablet 0 08/20/2016 at Unknown time   . OXYGEN Inhale 2-3 L into the lungs as needed. For shortness of breath, respiratory distress or O2 sats <90.   08/21/2016 at Unknown time  . sevelamer carbonate (RENVELA) 800 MG tablet Take 800 mg by mouth 3 (three) times daily.    08/20/2016 at Unknown time  . tiotropium (SPIRIVA) 18 MCG inhalation capsule Place 1 capsule (18 mcg total) into inhaler and inhale daily. 30 capsule 12 08/20/2016 at Unknown time  . [DISCONTINUED] acetaminophen (TYLENOL) 325 MG tablet Take 650 mg by mouth every 8 (eight) hours as needed. For general discomfort   Past Week at Unknown time   Scheduled:  .  amiodarone  200 mg Oral Daily  . atorvastatin  40 mg Oral QHS  . carvedilol  3.125 mg Oral BID WC  . docusate sodium  100 mg Oral Daily  . epoetin (EPOGEN/PROCRIT) injection  10,000 Units Intravenous Q T,Th,Sa-HD  . feeding supplement (PRO-STAT SUGAR FREE 64)  30 mL Oral QPM  . ferrous sulfate  325 mg Oral Daily  . folic acid  [id number] mcg Oral Daily  . gabapentin  100 mg Oral QHS  . guaiFENesin  600 mg Oral BID  . heparin  1,200 Units Intravenous Once  . ipratropium-albuterol  3 mL Nebulization TID  . lamoTRIgine  50 mg Oral QHS  . latanoprost  1 drop Both Eyes QHS  . magnesium oxide  400 mg Oral QPM  . mouth rinse  15 mL Mouth Rinse BID  . pantoprazole  40 mg Oral Daily  . sevelamer carbonate  800 mg Oral TID WC  . tiotropium  18 mcg Inhalation Daily  . warfarin  2.5 mg Oral q1800  . Warfarin - Physician Dosing Inpatient   Does not apply q1800   Infusions:  . heparin 1,950 Units/hr (08/24/16 2245)  . 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   Baseline labs have been ordered STAT.  11/29 Case was discussed with vascular surgeon Dr. Delana Meyer - gave the patient an initial bolus of 30 units/kg and target a HL of 0.3 -  0.5 units/mL  11/30 01:00 heparin level <0.1. 2400 unit bolus and increase rate to 1300 units/hr. Recheck in 8 hours.  Goal of Therapy:  Heparin level 0.3-0.5 units/ml Monitor platelets by anticoagulation protocol: Yes   Plan:  12/1 0800 HL =0.35 (goal 0.3-0.5)  RN called around 1030 stating that pt was bleeding from IV site. She turned drip off and removed IV-held pressure to site. Per Dr. Delana Meyer ok to resume drip at same rate (1650 units/hr) at different IV site once bleeding has stopped. RN to restart drip at 1100 Will recheck level in 8 hours @ 1900  12/1 20:00 heparin level 0.35. Continue current regimen. Recheck with tomorrow AM labs.  12/2 AM heparin level 0.21. 1200 unit bolus and increase rate to 1800 units/hr. Recheck in 8 hours.  12/2 PM Heparin level 0.26. Will increase drip heparin 1950 units/hr. Recheck level in 8 hours.   12/3 01:00 heparin level 0.22. 1200 unit bolus and increase rate to 2100 units/hr. Recheck in 8 hours.   Eloise Harman, PharmD, BCPS Clinical Pharmacist 08/25/2016 2:10 AM

## 2016-08-25 NOTE — Clinical Social Work Note (Signed)
Clinical Social Work Assessment  Patient Details  Name: [name] MRN: [id number] Date of Birth: 20-Nov-1940  Date of referral:  08/25/16               Reason for consult:  Facility Placement                Permission sought to share information with:  Facility Art therapist granted to share information::  Yes, Verbal Permission Granted  Name::        Agency::     Relationship::     Contact Information:     Housing/Transportation Living arrangements for the past 2 months:  [place] of Information:  Facility Patient Interpreter Needed:  None Criminal Activity/Legal Involvement Pertinent to Current Situation/Hospitalization:  No - Comment as needed Significant Relationships:  Other Family Members Lives with:  Facility Resident Do you feel safe going back to the place where you live?  Yes Need for family participation in patient care:  No (Coment)  Care giving concerns: Patient admitted from SNF LTC [place])   Facilities manager / plan:  CSW visited patient at bedside to discuss dc planning. The patient is non-verbal; however, he was able to answer yes/no questions with head movement, and he indicated orientation to self and permission to contact the facility. The patient is a LTC resident from [facility] and can return when medically stable.  Employment status:  Retired Nurse, adult, Medicaid In [place] PT Recommendations:  [place] / Referral to community resources:  [place]  Patient/Family's Response to care:  Patient smiled at [place] during visit.  Patient/Family's Understanding of and Emotional Response to Diagnosis, Current Treatment, and Prognosis:  Patient is able to understand and answer yes/no questions.  Emotional Assessment Appearance:  Appears stated age Attitude/Demeanor/Rapport:   (The patient is non-verbal but  pleasant.) Affect (typically observed):  Appropriate, Pleasant Orientation:  Oriented to Self (The patient is non-verbal but was able to indicate orientation to self.) Alcohol / Substance use:  Never Used Psych involvement (Current and /or in the community):  No (Comment)  Discharge Needs  Concerns to be addressed:  Discharge Planning Concerns Readmission within the last 30 days:  No Current discharge risk:  Chronically ill Barriers to Discharge:  Continued Medical Work up   [employer], LCSW 08/25/2016, 2:40 PM

## 2016-08-26 DIAGNOSIS — I70223 Atherosclerosis of native arteries of extremities with rest pain, bilateral legs: Secondary | ICD-10-CM

## 2016-08-26 LAB — CBC
HCT: 27.7 % — ABNORMAL LOW (ref 40.0–52.0)
HEMOGLOBIN: 8.6 g/dL — AB (ref 13.0–18.0)
MCH: 25.1 pg — AB (ref 26.0–34.0)
MCHC: 31 g/dL — ABNORMAL LOW (ref 32.0–36.0)
MCV: 81.1 fL (ref 80.0–100.0)
Platelets: 141 10*3/uL — ABNORMAL LOW (ref 150–440)
RBC: 3.41 MIL/uL — AB (ref 4.40–5.90)
RDW: 18.7 % — ABNORMAL HIGH (ref 11.5–14.5)
WBC: 11.4 10*3/uL — ABNORMAL HIGH (ref 3.8–10.6)

## 2016-08-26 LAB — PROTIME-INR
INR: 1.44
PROTHROMBIN TIME: 17.7 s — AB (ref 11.4–15.2)

## 2016-08-26 LAB — GLUCOSE, CAPILLARY: Glucose-Capillary: 91 mg/dL (ref 65–99)

## 2016-08-26 LAB — HEPARIN LEVEL (UNFRACTIONATED)
HEPARIN UNFRACTIONATED: 0.28 [IU]/mL — AB (ref 0.30–0.70)
Heparin Unfractionated: >3.6 IU/mL — ABNORMAL HIGH (ref 0.30–0.70)
Heparin Unfractionated: 0.35 IU/mL (ref 0.30–0.70)

## 2016-08-26 MED ORDER — HEPARIN BOLUS VIA INFUSION
1200.0000 [IU] | Freq: Once | INTRAVENOUS | Status: AC
  Administered 2016-08-26: 1200 [IU] via INTRAVENOUS
  Filled 2016-08-26: qty 1200

## 2016-08-26 MED ORDER — WARFARIN SODIUM 5 MG PO TABS
5.0000 mg | ORAL_TABLET | Freq: Every day | ORAL | Status: DC
  Administered 2016-08-26 – 2016-08-27 (×2): 5 mg via ORAL
  Filled 2016-08-26 (×2): qty 1

## 2016-08-26 NOTE — H&P (Signed)
[facility] History & Physical Update  The patient was interviewed and re-examined.  The patient's previous History and Physical has been reviewed and is unchanged.  There is no change in the plan of care. We plan to proceed with the scheduled procedure.  See not dated 08/21/2016  Hortencia Pilar, MD  08/26/2016, 10:25 AM

## 2016-08-26 NOTE — Progress Notes (Signed)
[facility]  ROUNDING NOTE   Subjective:   Laying in bed. No complaints  Objective:  Vital signs in last 24 hours:  Temp:  [98.3 F (36.8 C)-102.3 F (39.1 C)] 100.4 F (38 C) (12/04 1229) Pulse Rate:  [72-92] 85 (12/04 1220) Resp:  [18] 18 (12/04 1220) BP: (98-102)/(49-71) 102/57 (12/04 1220) SpO2:  [91 %-100 %] 98 % (12/04 1220)  Weight change:  Filed Weights   08/22/16 1952 08/24/16 1235 08/24/16 1555  Weight: 80.6 kg (177 lb 11.1 oz) 84.6 kg (186 lb 8.2 oz) 84 kg (185 lb 3 oz)    Intake/Output: I/O last 3 completed shifts: In: 480 [P.O.:480] Out: 200 [Urine:200]   Intake/Output this shift:  No intake/output data recorded.  Physical Exam: General: No acute distress  Head: Normocephalic, atraumatic. Moist oral mucosal membranes  Eyes: Anicteric  Neck: Supple, trachea midline  Lungs:  Scattered rhonchi, normal effort  Heart: S1S2 irregular  Abdomen:  Soft, nontender, bowel sounds present   Extremities:  no peripheral edema.  Neurologic: Nonfocal, moving all four extremities  Skin: No lesions  Access: LUE AVF    Basic Metabolic Panel:  Recent Labs Lab 08/21/16 2015 08/24/16 0603  CREATININE 5.85*  --   PHOS  --  4.4    Liver Function Tests: No results for input(s): AST, ALT, ALKPHOS, BILITOT, PROT, ALBUMIN in the last 168 hours. No results for input(s): LIPASE, AMYLASE in the last 168 hours. No results for input(s): AMMONIA in the last 168 hours.  CBC:  Recent Labs Lab 08/22/16 0956 08/23/16 2013 08/24/16 0603 08/25/16 0051 08/26/16 0458  WBC 8.5 7.2 8.6 8.7 11.4*  HGB 10.3* 9.8* 9.4* 9.2* 8.6*  HCT 32.5* 31.7* 30.7* 30.1* 27.7*  MCV 81.1 83.8 82.5 82.5 81.1  PLT 201 168 169 132* 141*    Cardiac Enzymes: No results for input(s): CKTOTAL, CKMB, CKMBINDEX, TROPONINI in the last 168 hours.  BNP: Invalid input(s): POCBNP  CBG:  Recent Labs Lab 08/22/16 1557 08/25/16 0748 08/25/16 1247 08/25/16 1636 08/26/16 0751   GLUCAP 132* 86 133* 131* 91    Microbiology: Results for orders placed or performed during the hospital encounter of 08/21/16  MRSA PCR Screening     Status: None   Collection Time: 08/21/16 11:00 AM  Result Value Ref Range Status   MRSA by PCR NEGATIVE NEGATIVE Final    Comment:        The GeneXpert MRSA Assay (FDA approved for NASAL specimens only), is one component of a comprehensive MRSA colonization surveillance program. It is not intended to diagnose MRSA infection nor to guide or monitor treatment for MRSA infections.     Coagulation Studies:  Recent Labs  08/24/16 0603 08/25/16 0051 08/26/16 0458  LABPROT 15.5* 17.2* 17.7*  INR 1.22 1.39 1.44    Urinalysis: No results for input(s): COLORURINE, LABSPEC, PHURINE, GLUCOSEU, HGBUR, BILIRUBINUR, KETONESUR, PROTEINUR, UROBILINOGEN, NITRITE, LEUKOCYTESUR in the last 72 hours.  Invalid input(s): APPERANCEUR    Imaging: No results found.   Medications:   . heparin 2,250 Units/hr (08/26/16 1115)  . phenylephrine (NEO-SYNEPHRINE) Adult infusion Stopped (08/22/16 0701)   . amiodarone  200 mg Oral Daily  . atorvastatin  40 mg Oral QHS  . carvedilol  3.125 mg Oral BID WC  . docusate sodium  100 mg Oral Daily  . epoetin (EPOGEN/PROCRIT) injection  10,000 Units Intravenous Q T,Th,Sa-HD  . feeding supplement (PRO-STAT SUGAR FREE 64)  30 mL Oral QPM  . ferrous sulfate  325 mg Oral  Daily  . folic acid  [id number] mcg Oral Daily  . gabapentin  100 mg Oral QHS  . guaiFENesin  600 mg Oral BID  . ipratropium-albuterol  3 mL Nebulization TID  . lamoTRIgine  50 mg Oral QHS  . latanoprost  1 drop Both Eyes QHS  . magnesium oxide  400 mg Oral QPM  . mouth rinse  15 mL Mouth Rinse BID  . pantoprazole  40 mg Oral Daily  . sevelamer carbonate  800 mg Oral TID WC  . tiotropium  18 mcg Inhalation Daily  . 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  ESRD on HD TTS: hemodialysis scheduled for tomorrow.   2. Peripheral Vascular Disease:  Status post angiogram and Viabahn stent graft placement. - transitioning to warfarin  3.  Anemia chronic kidney disease.  Hemoglobin 8.6.  - Continue Epogen 10,000 units IV with dialysis. - fe sulfate  4.  Secondary hyperparathyroidism.  - Continue Renvela 800 mg by mouth 3 times a day with meals.   5. Hypertension: blood pressure at goal.  - carvedilol   LOS: [place], Aki Burdin 12/4/20171:25 PM

## 2016-08-26 NOTE — Discharge Instructions (Signed)
Groin Insertion Instructions-If you lose feeling or develop tingling or pain in your leg or foot after the procedure, please walk around first.  If the discomfort does not improve , contact your physician and proceed to the nearest emergency room.  Loss of feeling in your leg might mean that a blockage has formed in the artery and this can be appropriately treated.  Limit your activity for the next two days after your procedure.  Avoid stooping, bending, heavy lifting or exertion as this may put pressure on the insertion site.  Resume normal activities in 48 hours.  You may shower after 24 hours but avoid excessive warm water and do not scrub the site.  Remove clear dressing in 48 hours.  If you have had a closure device inserted, do not soak in a tub bath or a hot tub for at least one week.  No driving for 48 hours after discharge.  After the procedure, check the insertion site occasionally.  If any oozing occurs or there is apparent swelling, firm pressure over the site will prevent a bruise from forming.  You can not hurt anything by pressing directly on the site.  The pressure stops the bleeding by allowing a small clot to form.  If the bleeding continues after the pressure has been applied for more than 15 minutes, call 911 or go to the nearest emergency room.    The x-ray dye causes you to pass a considerate amount of urine.  For this reason, you will be asked to drink plenty of liquids after the procedure to prevent dehydration.  You may resume you regular diet.  Avoid caffeine products.    For pain at the site of your procedure, take non-aspirin medicines such as Tylenol.  Medications: A. Hold Metformin for 48 hours if applicable.  B. Continue taking all your present medications at home unless your doctor prescribes any changes.   Information on my medicine - Coumadin   (Warfarin)  This medication education was reviewed with me or my healthcare representative as part of my discharge  preparation.  The pharmacist that spoke with me during my hospital stay was:  Ramond Dial, [facility]  Why was Coumadin prescribed for you? Coumadin was prescribed for you because you have a blood clot or a medical condition that can cause an increased risk of forming blood clots. Blood clots can cause serious health problems by blocking the flow of blood to the heart, lung, or brain. Coumadin can prevent harmful blood clots from forming. As a reminder your indication for Coumadin is:   Blood Clotting Disorder  What test will check on my response to Coumadin? While on Coumadin (warfarin) you will need to have an INR test regularly to ensure that your dose is keeping you in the desired range. The INR (international normalized ratio) number is calculated from the result of the laboratory test called prothrombin time (PT).  If an INR APPOINTMENT HAS NOT ALREADY BEEN MADE FOR YOU please schedule an appointment to have this lab work done by your health care provider within 7 days. Your INR goal is usually a number between:  2 to 3 or your provider may give you a more narrow range like 2-2.5.  Ask your health care provider during an office visit what your goal INR is.  What  do you need to  know  About  COUMADIN? Take Coumadin (warfarin) exactly as prescribed by your healthcare provider about the same time each day.  DO NOT  stop taking without talking to the doctor who prescribed the medication.  Stopping without other blood clot prevention medication to take the place of Coumadin may increase your risk of developing a new clot or stroke.  Get refills before you run out.  What do you do if you miss a dose? If you miss a dose, take it as soon as you remember on the same day then continue your regularly scheduled regimen the next day.  Do not take two doses of Coumadin at the same time.  Important Safety Information A possible side effect of Coumadin (Warfarin) is an increased risk of bleeding. You should  call your healthcare provider right away if you experience any of the following: ? Bleeding from an injury or your nose that does not stop. ? Unusual colored urine (red or dark brown) or unusual colored stools (red or black). ? Unusual bruising for unknown reasons. ? A serious fall or if you hit your head (even if there is no bleeding).  Some foods or medicines interact with Coumadin (warfarin) and might alter your response to warfarin. To help avoid this: ? Eat a balanced diet, maintaining a consistent amount of Vitamin K. ? Notify your provider about major diet changes you plan to make. ? Avoid alcohol or limit your intake to 1 drink for women and 2 drinks for men per day. (1 drink is 5 oz. wine, 12 oz. beer, or 1.5 oz. liquor.)  Make sure that ANY health care provider who prescribes medication for you knows that you are taking Coumadin (warfarin).  Also make sure the healthcare provider who is monitoring your Coumadin knows when you have started a new medication including herbals and non-prescription products.  Coumadin (Warfarin)  Major Drug Interactions  Increased Warfarin Effect Decreased Warfarin Effect  Alcohol (large quantities) Antibiotics (esp. Septra/Bactrim, Flagyl, Cipro) Amiodarone (Cordarone) Aspirin (ASA) Cimetidine (Tagamet) Megestrol (Megace) NSAIDs (ibuprofen, naproxen, etc.) Piroxicam (Feldene) Propafenone (Rythmol SR) Propranolol (Inderal) Isoniazid (INH) Posaconazole (Noxafil) Barbiturates (Phenobarbital) Carbamazepine (Tegretol) Chlordiazepoxide (Librium) Cholestyramine (Questran) Griseofulvin Oral Contraceptives Rifampin Sucralfate (Carafate) Vitamin K   Coumadin (Warfarin) Major Herbal Interactions  Increased Warfarin Effect Decreased Warfarin Effect  Garlic Ginseng Ginkgo biloba Coenzyme Q10 Green tea St. Johns wort    Coumadin (Warfarin) FOOD Interactions  Eat a consistent number of servings per week of foods HIGH in Vitamin K (1 serving  =  cup)  Collards (cooked, or boiled & drained) Kale (cooked, or boiled & drained) Mustard greens (cooked, or boiled & drained) Parsley *serving size only =  cup Spinach (cooked, or boiled & drained) Swiss chard (cooked, or boiled & drained) Turnip greens (cooked, or boiled & drained)  Eat a consistent number of servings per week of foods MEDIUM-HIGH in Vitamin K (1 serving = 1 cup)  Asparagus (cooked, or boiled & drained) Broccoli (cooked, boiled & drained, or raw & chopped) Brussel sprouts (cooked, or boiled & drained) *serving size only =  cup Lettuce, raw (green leaf, endive, romaine) Spinach, raw Turnip greens, raw & chopped   These websites have more information on Coumadin (warfarin):  FailFactory.se; VeganReport.com.au;

## 2016-08-26 NOTE — Progress Notes (Signed)
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 Penicillins Hives, Itching and Swelling    Has patient had a PCN reaction causing immediate rash, facial/tongue/throat swelling, SOB or lightheadedness with hypotension: Yes Has patient had a PCN reaction causing severe rash involving mucus membranes or skin necrosis: No Has patient had a PCN reaction that required hospitalization No Has patient had a PCN reaction occurring within the last 10 years: No If all of the above answers are "NO", then may proceed with Cephalosporin use.    Patient Measurements: Height: 6\' 2"  (188 cm) Weight: 185 lb 3 oz (84 kg) IBW/kg (Calculated) : 82.2 Heparin Dosing Weight: 80 kg  Vital Signs: Temp: 98.7 F (37.1 C) (12/04 1400) Temp Source: Axillary (12/04 1400) BP: 90/56 (12/04 1400) Pulse Rate: 73 (12/04 1657)  Labs:  Recent Labs  08/24/16 0603  08/25/16 0051  08/26/16 0458 08/26/16 1517 08/26/16 1712  HGB 9.4*  --  9.2*  --  8.6*  --   --   HCT 30.7*  --  30.1*  --  27.7*  --   --   PLT 169  --  132*  --  141*  --   --   LABPROT 15.5*  --  17.2*  --  17.7*  --   --   INR 1.22  --  1.39  --  1.44  --   --   HEPARINUNFRC 0.21*  < > 0.22*  < > 0.28* >3.60* 0.35  < > = values in this interval not displayed.  Estimated Creatinine Clearance: 12.9 mL/min (by C-G formula based on SCr of 5.85 mg/dL (H)).  Medical History: Past Medical History:  Diagnosis Date  . AAA (abdominal aortic aneurysm) ([place])   . Allergic rhinitis   . Anemia    a. baseline hgb ~ 8  . Bladder cancer ([place])   . Chronic combined systolic and diastolic CHF (congestive heart failure) ([place])    a. echo 12/2014: EF 25-30%, anterior wall wall motion abnormalities; b. planned ischemic evaluation once patient is stable medically; c. echo 01/2015: EF 45-50%, no RWMA, mild AT,  LA mildly dilated, mod pericardial effusion along LV free wall, no evidence of hemodynamic compromise  . Cognitive communication deficit   . COPD (chronic obstructive pulmonary disease) ([place])   . Dementia   . Detrusor sphincter dyssynergia   . Dysrhythmia   . ESRD on hemodialysis ([place])    a. Tuesday, Thursday, and Saturdays  . GERD (gastroesophageal reflux disease)   . History of septic shock    a. 01/2015  . History of small bowel obstruction    a. 01/2015  . Hydronephrosis   . Hypercholesteremia   . Hypertension   . 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 Incontinence   . Incontinence of urine   . Magnesium deficiency   . Mini stroke ([place])   . Overactive bladder   . PAF (paroxysmal atrial fibrillation) ([place])    a. new onset 12/2014 in the setting of UTI, sepsis, hypotension, and anemia; b. not on long term anticoagulation given anemia; c. family aware of stroke risk, they are ok with this; d. on amiodarone   . Prostate cancer ([place])   . Stroke [facility])     Medications:  Prescriptions Prior to Admission  Medication Sig Dispense Refill Last Dose  . albuterol (PROVENTIL) (2.5 MG/3ML) 0.083% nebulizer solution Take  3 mLs (2.5 mg total) by nebulization every 4 (four) hours as needed for wheezing or shortness of breath. 75 mL 12 08/20/2016 at Unknown time  . Amino Acids-Protein Hydrolys (FEEDING SUPPLEMENT, PRO-STAT SUGAR FREE 64,) LIQD Take 30 mLs by mouth every evening.   08/20/2016 at Unknown time  . amiodarone (PACERONE) 200 MG tablet Take 1 tablet (200 mg total) by mouth daily. 30 tablet 5 08/20/2016 at Unknown time  . atorvastatin (LIPITOR) 40 MG tablet Take 40 mg by mouth at bedtime.   08/20/2016 at Unknown time  . budesonide-formoterol (SYMBICORT) 160-4.5 MCG/ACT inhaler Inhale 2 puffs into the lungs 2 (two) times daily.   08/20/2016 at Unknown time  . carvedilol (COREG) 3.125 MG tablet Take 1 tablet (3.125 mg total) by mouth 2 (two) times daily with a meal. 60 tablet 2 08/20/2016 at Unknown time   . diclofenac sodium (VOLTAREN) 1 % GEL Apply 2 g topically 3 (three) times daily.    08/20/2016 at Unknown time  . ferrous sulfate 325 (65 FE) MG tablet Take 325 mg by mouth daily.   08/20/2016 at Unknown time  . folic acid (FOLVITE) [id number] MCG tablet Take 400 mcg by mouth daily.   08/20/2016 at Unknown time  . gabapentin (NEURONTIN) 100 MG capsule Take 100 mg by mouth at bedtime.    08/20/2016 at Unknown time  . guaiFENesin (MUCINEX) 600 MG 12 hr tablet Take 600 mg by mouth 2 (two) times daily. (Take every morning and at bedtime for cough)   08/20/2016 at Unknown time  . guaifenesin (ROBITUSSIN) 100 MG/5ML syrup Take 200 mg by mouth every 4 (four) hours as needed for cough.   Past Week at Unknown time  . ipratropium-albuterol (DUONEB) 0.5-2.5 (3) MG/3ML SOLN Take 3 mLs by nebulization 3 (three) times daily. May inhale an additional  67mls 3 times a day as needed for chronic obstructive pulmonary disease   08/20/2016 at Unknown time  . lamoTRIgine (LAMICTAL) 25 MG tablet Take 50 mg by mouth at bedtime. Start on July 14th.   08/20/2016 at Unknown time  . latanoprost (XALATAN) 0.005 % ophthalmic solution Place 1 drop into both eyes at bedtime.   08/20/2016 at Unknown time  . LORazepam (ATIVAN) 0.5 MG tablet Take 1 tablet (0.5 mg total) by mouth every 4 (four) hours as needed for anxiety. (Patient taking differently: Take 0.5 mg by mouth 2 (two) times daily. May take an additional 0.5mg  every 4 hours as needed for anxiety) 30 tablet 0 08/20/2016 at Unknown time  . magnesium oxide (MAG-OX) 400 MG tablet Take 1 tablet (400 mg total) by mouth every evening. 30 tablet 2 08/20/2016 at Unknown time  . omeprazole (PRILOSEC) 20 MG capsule Take 20 mg by mouth daily.   08/20/2016 at Unknown time  . ondansetron (ZOFRAN) 8 MG tablet Take by mouth every 8 (eight) hours as needed for nausea.   08/20/2016 at Unknown time  . oxyCODONE (OXY IR/ROXICODONE) 5 MG immediate release tablet Take 1 tablet (5 mg total) by mouth  every 6 (six) hours as needed for moderate pain or severe pain. 20 tablet 0 08/20/2016 at Unknown time  . OXYGEN Inhale 2-3 L into the lungs as needed. For shortness of breath, respiratory distress or O2 sats <90.   08/21/2016 at Unknown time  . sevelamer carbonate (RENVELA) 800 MG tablet Take 800 mg by mouth 3 (three) times daily.    08/20/2016 at Unknown time  . tiotropium (SPIRIVA) 18 MCG inhalation capsule Place 1 capsule (18  mcg total) into inhaler and inhale daily. 30 capsule 12 08/20/2016 at Unknown time  . [DISCONTINUED] acetaminophen (TYLENOL) 325 MG tablet Take 650 mg by mouth every 8 (eight) hours as needed. For general discomfort   Past Week at Unknown time   Scheduled:  . amiodarone  200 mg Oral Daily  . atorvastatin  40 mg Oral QHS  . carvedilol  3.125 mg Oral BID WC  . docusate sodium  100 mg Oral Daily  . epoetin (EPOGEN/PROCRIT) injection  10,000 Units Intravenous Q T,Th,Sa-HD  . feeding supplement (PRO-STAT SUGAR FREE 64)  30 mL Oral QPM  . ferrous sulfate  325 mg Oral Daily  . folic acid  [id number] mcg Oral Daily  . gabapentin  100 mg Oral QHS  . guaiFENesin  600 mg Oral BID  . ipratropium-albuterol  3 mL Nebulization TID  . lamoTRIgine  50 mg Oral QHS  . latanoprost  1 drop Both Eyes QHS  . magnesium oxide  400 mg Oral QPM  . mouth rinse  15 mL Mouth Rinse BID  . pantoprazole  40 mg Oral Daily  . sevelamer carbonate  800 mg Oral TID WC  . tiotropium  18 mcg Inhalation Daily  . warfarin  5 mg Oral q1800   Infusions:  . heparin 2,250 Units/hr (08/26/16 1115)  . 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   Baseline labs have been ordered STAT.  11/29 Case was discussed with vascular surgeon Dr. Delana Meyer - gave the patient an initial bolus of 30 units/kg and target a HL of 0.3 - 0.5  units/mL  11/30 01:00 heparin level <0.1. 2400 unit bolus and increase rate to 1300 units/hr. Recheck in 8 hours.  Goal of Therapy:  Heparin level 0.3-0.5 units/ml Monitor platelets by anticoagulation protocol: Yes   Plan:  12/1 0800 HL =0.35 (goal 0.3-0.5)  RN called around 1030 stating that pt was bleeding from IV site. She turned drip off and removed IV-held pressure to site. Per Dr. Delana Meyer ok to resume drip at same rate (1650 units/hr) at different IV site once bleeding has stopped. RN to restart drip at 1100 Will recheck level in 8 hours @ 1900  12/1 20:00 heparin level 0.35. Continue current regimen. Recheck with tomorrow AM labs.  12/2 AM heparin level 0.21. 1200 unit bolus and increase rate to 1800 units/hr. Recheck in 8 hours.  12/2 PM Heparin level 0.26. Will increase drip heparin 1950 units/hr. Recheck level in 8 hours.   12/3 01:00 heparin level 0.22. 1200 unit bolus and increase rate to 2100 units/hr. Recheck in 8 hours.  12/3 @ 12:28  HL = 0.44. Continue current drip rate. Recheck in 8 hours at 20:30.  12/3 @ 2008 HL= 0.39 Continue current drip rate. Recheck HL with am labs.   12/3 AM heparin level 0.28. 1200 unit bolus and increase rate to 2250 units/hr. Recheck in 8 hours.  12/4  HL @ 15:00 = 3.76.   Previous level was 0.28.  Most likely sample was drawn from same line as heparin infusion.  Will repeat HL and adjust based on that value.   12/4 at 17:12  HL = 0.35.  Will continue current drip rate.  Will recheck HL with am labs.   Rekita Miotke K,RPh Clinical Pharmacist 08/26/2016 6:36 PM

## 2016-08-26 NOTE — Progress Notes (Signed)
[facility]  Daily Progress Note   Subjective  - 5 Days Post-Op  Patient sleeping comfortably when I entered the room. Upon waking he says his right foot feels fine the again asks when we going to treat the left  Objective Vitals:   08/26/16 1220 08/26/16 1229 08/26/16 1400 08/26/16 1657  BP: (!) 102/57  (!) 90/56   Pulse: 85  89 73  Resp: 18  16   Temp: (!) 102.3 F (39.1 C) (!) 100.4 F (38 C) 98.7 F (37.1 C)   TempSrc: Oral Axillary Axillary   SpO2: 98%  97%   Weight:      Height:        Intake/Output Summary (Last 24 hours) at 08/26/16 1920 Last data filed at 08/26/16 1458  Gross per 24 hour  Intake              120 ml  Output                0 ml  Net              120 ml    PULM  Normal effort , no use of accessory muscles CV  No JVD, RRR Abd      No distended, nontender VASC  Right foot warm left foot cool peel pulses nonpalpable  Laboratory CBC    Component Value Date/Time   WBC 11.4 (H) 08/26/2016 0458   HGB 8.6 (L) 08/26/2016 0458   HGB 6.9 (L) 01/20/2015 0430   HCT 27.7 (L) 08/26/2016 0458   HCT 20.0 (L) 06/08/2015 1300   PLT 141 (L) 08/26/2016 0458   PLT 176 01/20/2015 0430    BMET    Component Value Date/Time   NA 139 05/31/2016 1121   NA 141 01/20/2015 0430   K 3.5 05/31/2016 1121   K 3.6 01/20/2015 0430   CL 96 (L) 05/31/2016 1121   CL 99 (L) 01/20/2015 0430   CO2 33 (H) 05/31/2016 1121   CO2 30 01/20/2015 0430   GLUCOSE 104 (H) 05/31/2016 1121   GLUCOSE 119 (H) 01/20/2015 0430   BUN 52 (H) 05/31/2016 1121   BUN 32 (H) 01/20/2015 0430   CREATININE 5.85 (H) 08/21/2016 2015   CREATININE 6.73 (H) 01/20/2015 0430   CALCIUM 8.5 (L) 05/31/2016 1121   CALCIUM 7.8 (L) 01/20/2015 0430   GFRNONAA 9 (L) 08/21/2016 2015   GFRNONAA 7 (L) 01/20/2015 0430   GFRAA 10 (L) 08/21/2016 2015   GFRAA 9 (L) 01/20/2015 0430    Assessment/Planning: POD #5 s/p repair of right popliteal artery aneurysm   INR still  subtherapeutic Coumadin increased.  With respect to his left lower extremity this will represent technically a much greater challenge they're much fewer options. I have mentioned to this on several occasions I believe he is at high risk for limb loss. He again wants everything done to prevent amputation. However, given his postoperative hypotension I am concerned that interventions will have difficulty staying open his cardiac function is clearly very poor. On the right at least we had patent outflow on the left I believe his tibial outflow is even more compromised. As I told him tonight I would like to get him out of the hospital and back to his baseline and once he is there we will move forward with addressing his left leg.    [name]  08/26/2016, 7:20 PM

## 2016-08-26 NOTE — Progress Notes (Signed)
ANTICOAGULATION CONSULT NOTE - Initial Consult  Pharmacy Consult for Warfarin Indication: cardiomyopathy  Allergies  Allergen Reactions  . Penicillins Hives, Itching and Swelling    Has patient had a PCN reaction causing immediate rash, facial/tongue/throat swelling, SOB or lightheadedness with hypotension: Yes Has patient had a PCN reaction causing severe rash involving mucus membranes or skin necrosis: No Has patient had a PCN reaction that required hospitalization No Has patient had a PCN reaction occurring within the last 10 years: No If all of the above answers are "NO", then may proceed with Cephalosporin use.    Patient Measurements: Height: 6\' 2"  (188 cm) Weight: 185 lb 3 oz (84 kg) IBW/kg (Calculated) : 82.2  Vital Signs: Temp: 98.7 F (37.1 C) (12/04 1400) Temp Source: Axillary (12/04 1400) BP: 90/56 (12/04 1400) Pulse Rate: 73 (12/04 1657)  Labs:  Recent Labs  08/24/16 0603  08/25/16 0051  08/26/16 0458 08/26/16 1517 08/26/16 1712  HGB 9.4*  --  9.2*  --  8.6*  --   --   HCT 30.7*  --  30.1*  --  27.7*  --   --   PLT 169  --  132*  --  141*  --   --   LABPROT 15.5*  --  17.2*  --  17.7*  --   --   INR 1.22  --  1.39  --  1.44  --   --   HEPARINUNFRC 0.21*  < > 0.22*  < > 0.28* >3.60* 0.35  < > = values in this interval not displayed.  Estimated Creatinine Clearance: 12.9 mL/min (by C-G formula based on SCr of 5.85 mg/dL (H)).   Medical History: Past Medical History:  Diagnosis Date  . AAA (abdominal aortic aneurysm) ([place])   . Allergic rhinitis   . Anemia    a. baseline hgb ~ 8  . Bladder cancer ([place])   . Chronic combined systolic and diastolic CHF (congestive heart failure) ([place])    a. echo 12/2014: EF 25-30%, anterior wall wall motion abnormalities; b. planned ischemic evaluation once patient is stable medically; c. echo 01/2015: EF 45-50%, no RWMA, mild AT, LA mildly dilated, mod pericardial effusion along LV free wall, no evidence of hemodynamic  compromise  . Cognitive communication deficit   . COPD (chronic obstructive pulmonary disease) ([place])   . Dementia   . Detrusor sphincter dyssynergia   . Dysrhythmia   . ESRD on hemodialysis ([place])    a. Tuesday, Thursday, and Saturdays  . GERD (gastroesophageal reflux disease)   . History of septic shock    a. 01/2015  . History of small bowel obstruction    a. 01/2015  . Hydronephrosis   . Hypercholesteremia   . Hypertension   . 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 Incontinence   . Incontinence of urine   . Magnesium deficiency   . Mini stroke ([place])   . Overactive bladder   . PAF (paroxysmal atrial fibrillation) ([place])    a. new onset 12/2014 in the setting of UTI, sepsis, hypotension, and anemia; b. not on long term anticoagulation given anemia; c. family aware of stroke risk, they are ok with this; d. on amiodarone   . Prostate cancer ([place])   . Stroke [facility])     Medications:  Scheduled:  . amiodarone  200 mg Oral Daily  . atorvastatin  40 mg Oral QHS  . carvedilol  3.125 mg Oral BID WC  . docusate sodium  100 mg Oral Daily  .  epoetin (EPOGEN/PROCRIT) injection  10,000 Units Intravenous Q T,Th,Sa-HD  . feeding supplement (PRO-STAT SUGAR FREE 64)  30 mL Oral QPM  . ferrous sulfate  325 mg Oral Daily  . folic acid  [id number] mcg Oral Daily  . gabapentin  100 mg Oral QHS  . guaiFENesin  600 mg Oral BID  . ipratropium-albuterol  3 mL Nebulization TID  . lamoTRIgine  50 mg Oral QHS  . latanoprost  1 drop Both Eyes QHS  . magnesium oxide  400 mg Oral QPM  . mouth rinse  15 mL Mouth Rinse BID  . pantoprazole  40 mg Oral Daily  . sevelamer carbonate  800 mg Oral TID WC  . tiotropium  18 mcg Inhalation Daily  . warfarin  5 mg Oral q1800   Infusions:  . heparin 2,250 Units/hr (08/26/16 1115)  . phenylephrine (NEO-SYNEPHRINE) Adult infusion Stopped (08/22/16 0701)    Assessment: Pharmacy consulted to dose and monitor warfarin for this 75 yo male for cardiomyopathy and PAD s/p repair of  popliteal artery aneurysm.  Patient has been on heparin drip since 11/29.  12/4  INR = 1.44   Goal of Therapy:  INR 2-3 Monitor platelets by anticoagulation protocol: Yes   Plan:  Ordered Warfarin 5mg  PO daily at 18:00.  Will recheck INR with am labs.  [place] Clinical Pharmacist 08/26/2016,6:28 PM

## 2016-08-26 NOTE — Progress Notes (Signed)
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 Penicillins Hives, Itching and Swelling    Has patient had a PCN reaction causing immediate rash, facial/tongue/throat swelling, SOB or lightheadedness with hypotension: Yes Has patient had a PCN reaction causing severe rash involving mucus membranes or skin necrosis: No Has patient had a PCN reaction that required hospitalization No Has patient had a PCN reaction occurring within the last 10 years: No If all of the above answers are "NO", then may proceed with Cephalosporin use.    Patient Measurements: Height: 6\' 2"  (188 cm) Weight: 185 lb 3 oz (84 kg) IBW/kg (Calculated) : 82.2 Heparin Dosing Weight: 80 kg  Vital Signs: Temp: 99.5 F (37.5 C) (12/04 0353) Temp Source: Oral (12/04 0353) BP: 101/71 (12/04 0353) Pulse Rate: 92 (12/04 0353)  Labs:  Recent Labs  08/24/16 0603  08/25/16 0051 08/25/16 1228 08/25/16 2008 08/26/16 0458  HGB 9.4*  --  9.2*  --   --  8.6*  HCT 30.7*  --  30.1*  --   --  27.7*  PLT 169  --  132*  --   --  141*  LABPROT 15.5*  --  17.2*  --   --  17.7*  INR 1.22  --  1.39  --   --  1.44  HEPARINUNFRC 0.21*  < > 0.22* 0.44 0.39 0.28*  < > = values in this interval not displayed.  Estimated Creatinine Clearance: 12.9 mL/min (by C-G formula based on SCr of 5.85 mg/dL (H)).  Medical History: Past Medical History:  Diagnosis Date  . AAA (abdominal aortic aneurysm) ([place])   . Allergic rhinitis   . Anemia    a. baseline hgb ~ 8  . Bladder cancer ([place])   . Chronic combined systolic and diastolic CHF (congestive heart failure) ([place])    a. echo 12/2014: EF 25-30%, anterior wall wall motion abnormalities; b. planned ischemic evaluation once patient is stable medically; c. echo 01/2015: EF 45-50%, no RWMA, mild AT, LA mildly dilated, mod pericardial  effusion along LV free wall, no evidence of hemodynamic compromise  . Cognitive communication deficit   . COPD (chronic obstructive pulmonary disease) ([place])   . Dementia   . Detrusor sphincter dyssynergia   . Dysrhythmia   . ESRD on hemodialysis ([place])    a. Tuesday, Thursday, and Saturdays  . GERD (gastroesophageal reflux disease)   . History of septic shock    a. 01/2015  . History of small bowel obstruction    a. 01/2015  . Hydronephrosis   . Hypercholesteremia   . Hypertension   . 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 Incontinence   . Incontinence of urine   . Magnesium deficiency   . Mini stroke ([place])   . Overactive bladder   . PAF (paroxysmal atrial fibrillation) ([place])    a. new onset 12/2014 in the setting of UTI, sepsis, hypotension, and anemia; b. not on long term anticoagulation given anemia; c. family aware of stroke risk, they are ok with this; d. on amiodarone   . Prostate cancer ([place])   . Stroke [facility])     Medications:  Prescriptions Prior to Admission  Medication Sig Dispense Refill Last Dose  . albuterol (PROVENTIL) (2.5 MG/3ML) 0.083% nebulizer solution Take 3 mLs (2.5 mg total) by nebulization every 4 (four) hours as needed for wheezing or shortness of breath.  75 mL 12 08/20/2016 at Unknown time  . Amino Acids-Protein Hydrolys (FEEDING SUPPLEMENT, PRO-STAT SUGAR FREE 64,) LIQD Take 30 mLs by mouth every evening.   08/20/2016 at Unknown time  . amiodarone (PACERONE) 200 MG tablet Take 1 tablet (200 mg total) by mouth daily. 30 tablet 5 08/20/2016 at Unknown time  . atorvastatin (LIPITOR) 40 MG tablet Take 40 mg by mouth at bedtime.   08/20/2016 at Unknown time  . budesonide-formoterol (SYMBICORT) 160-4.5 MCG/ACT inhaler Inhale 2 puffs into the lungs 2 (two) times daily.   08/20/2016 at Unknown time  . carvedilol (COREG) 3.125 MG tablet Take 1 tablet (3.125 mg total) by mouth 2 (two) times daily with a meal. 60 tablet 2 08/20/2016 at Unknown time  . diclofenac sodium (VOLTAREN) 1 %  GEL Apply 2 g topically 3 (three) times daily.    08/20/2016 at Unknown time  . ferrous sulfate 325 (65 FE) MG tablet Take 325 mg by mouth daily.   08/20/2016 at Unknown time  . folic acid (FOLVITE) A999333 MCG tablet Take 400 mcg by mouth daily.   08/20/2016 at Unknown time  . gabapentin (NEURONTIN) 100 MG capsule Take 100 mg by mouth at bedtime.    08/20/2016 at Unknown time  . guaiFENesin (MUCINEX) 600 MG 12 hr tablet Take 600 mg by mouth 2 (two) times daily. (Take every morning and at bedtime for cough)   08/20/2016 at Unknown time  . guaifenesin (ROBITUSSIN) 100 MG/5ML syrup Take 200 mg by mouth every 4 (four) hours as needed for cough.   Past Week at Unknown time  . ipratropium-albuterol (DUONEB) 0.5-2.5 (3) MG/3ML SOLN Take 3 mLs by nebulization 3 (three) times daily. May inhale an additional  27mls 3 times a day as needed for chronic obstructive pulmonary disease   08/20/2016 at Unknown time  . lamoTRIgine (LAMICTAL) 25 MG tablet Take 50 mg by mouth at bedtime. Start on July 14th.   08/20/2016 at Unknown time  . latanoprost (XALATAN) 0.005 % ophthalmic solution Place 1 drop into both eyes at bedtime.   08/20/2016 at Unknown time  . LORazepam (ATIVAN) 0.5 MG tablet Take 1 tablet (0.5 mg total) by mouth every 4 (four) hours as needed for anxiety. (Patient taking differently: Take 0.5 mg by mouth 2 (two) times daily. May take an additional 0.5mg  every 4 hours as needed for anxiety) 30 tablet 0 08/20/2016 at Unknown time  . magnesium oxide (MAG-OX) 400 MG tablet Take 1 tablet (400 mg total) by mouth every evening. 30 tablet 2 08/20/2016 at Unknown time  . omeprazole (PRILOSEC) 20 MG capsule Take 20 mg by mouth daily.   08/20/2016 at Unknown time  . ondansetron (ZOFRAN) 8 MG tablet Take by mouth every 8 (eight) hours as needed for nausea.   08/20/2016 at Unknown time  . oxyCODONE (OXY IR/ROXICODONE) 5 MG immediate release tablet Take 1 tablet (5 mg total) by mouth every 6 (six) hours as needed for  moderate pain or severe pain. 20 tablet 0 08/20/2016 at Unknown time  . OXYGEN Inhale 2-3 L into the lungs as needed. For shortness of breath, respiratory distress or O2 sats <90.   08/21/2016 at Unknown time  . sevelamer carbonate (RENVELA) 800 MG tablet Take 800 mg by mouth 3 (three) times daily.    08/20/2016 at Unknown time  . tiotropium (SPIRIVA) 18 MCG inhalation capsule Place 1 capsule (18 mcg total) into inhaler and inhale daily. 30 capsule 12 08/20/2016 at Unknown time  . [DISCONTINUED] acetaminophen (TYLENOL)  325 MG tablet Take 650 mg by mouth every 8 (eight) hours as needed. For general discomfort   Past Week at Unknown time   Scheduled:  . amiodarone  200 mg Oral Daily  . atorvastatin  40 mg Oral QHS  . carvedilol  3.125 mg Oral BID WC  . docusate sodium  100 mg Oral Daily  . epoetin (EPOGEN/PROCRIT) injection  10,000 Units Intravenous Q T,Th,Sa-HD  . feeding supplement (PRO-STAT SUGAR FREE 64)  30 mL Oral QPM  . ferrous sulfate  325 mg Oral Daily  . folic acid  [id number] mcg Oral Daily  . gabapentin  100 mg Oral QHS  . guaiFENesin  600 mg Oral BID  . ipratropium-albuterol  3 mL Nebulization TID  . lamoTRIgine  50 mg Oral QHS  . latanoprost  1 drop Both Eyes QHS  . magnesium oxide  400 mg Oral QPM  . mouth rinse  15 mL Mouth Rinse BID  . pantoprazole  40 mg Oral Daily  . sevelamer carbonate  800 mg Oral TID WC  . tiotropium  18 mcg Inhalation Daily  . Warfarin - Physician Dosing Inpatient   Does not apply q1800   Infusions:  . heparin 2,100 Units/hr (08/26/16 0036)  . 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   Baseline labs have been ordered STAT.  11/29 Case was discussed with vascular surgeon Dr. Delana Meyer - gave the patient an initial bolus of 30 units/kg and target a HL of 0.3 - 0.5  units/mL  11/30 01:00 heparin level <0.1. 2400 unit bolus and increase rate to 1300 units/hr. Recheck in 8 hours.  Goal of Therapy:  Heparin level 0.3-0.5 units/ml Monitor platelets by anticoagulation protocol: Yes   Plan:  12/1 0800 HL =0.35 (goal 0.3-0.5)  RN called around 1030 stating that pt was bleeding from IV site. She turned drip off and removed IV-held pressure to site. Per Dr. Delana Meyer ok to resume drip at same rate (1650 units/hr) at different IV site once bleeding has stopped. RN to restart drip at 1100 Will recheck level in 8 hours @ 1900  12/1 20:00 heparin level 0.35. Continue current regimen. Recheck with tomorrow AM labs.  12/2 AM heparin level 0.21. 1200 unit bolus and increase rate to 1800 units/hr. Recheck in 8 hours.  12/2 PM Heparin level 0.26. Will increase drip heparin 1950 units/hr. Recheck level in 8 hours.   12/3 01:00 heparin level 0.22. 1200 unit bolus and increase rate to 2100 units/hr. Recheck in 8 hours.  12/3 @ 12:28  HL = 0.44. Continue current drip rate. Recheck in 8 hours at 20:30.  12/3 @ 2008 HL= 0.39 Continue current drip rate. Recheck HL with am labs.   12/3 AM heparin level 0.28. 1200 unit bolus and increase rate to 2250 units/hr. Recheck in 8 hours.  Eloise Harman, PharmD, BCPS Clinical Pharmacist 08/26/2016 6:22 AM

## 2016-08-26 NOTE — Progress Notes (Signed)
PT Cancellation Note  Patient Details Name: [name] MRN: [id number] DOB: 05-29-1941   Cancelled Treatment:    Reason Eval/Treat Not Completed: Patient obtaining procedure, unavailable.  Will attempt PT services at a future time as appropriate.    Blanche [place] Kading 08/26/2016, 3:45 PM

## 2016-08-26 NOTE — Progress Notes (Signed)
Pt. Did not sleep throughout the night. He had c/o pain x2, medicated with somewhat effective results. No signs or c/o SOB or acute distress noted.

## 2016-08-26 NOTE — Progress Notes (Signed)
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 Penicillins Hives, Itching and Swelling    Has patient had a PCN reaction causing immediate rash, facial/tongue/throat swelling, SOB or lightheadedness with hypotension: Yes Has patient had a PCN reaction causing severe rash involving mucus membranes or skin necrosis: No Has patient had a PCN reaction that required hospitalization No Has patient had a PCN reaction occurring within the last 10 years: No If all of the above answers are "NO", then may proceed with Cephalosporin use.    Patient Measurements: Height: 6\' 2"  (188 cm) Weight: 185 lb 3 oz (84 kg) IBW/kg (Calculated) : 82.2 Heparin Dosing Weight: 80 kg  Vital Signs: Temp: 98.7 F (37.1 C) (12/04 1400) Temp Source: Axillary (12/04 1400) BP: 90/56 (12/04 1400) Pulse Rate: 73 (12/04 1657)  Labs:  Recent Labs  08/24/16 0603  08/25/16 0051  08/25/16 2008 08/26/16 0458 08/26/16 1517  HGB 9.4*  --  9.2*  --   --  8.6*  --   HCT 30.7*  --  30.1*  --   --  27.7*  --   PLT 169  --  132*  --   --  141*  --   LABPROT 15.5*  --  17.2*  --   --  17.7*  --   INR 1.22  --  1.39  --   --  1.44  --   HEPARINUNFRC 0.21*  < > 0.22*  < > 0.39 0.28* >3.60*  < > = values in this interval not displayed.  Estimated Creatinine Clearance: 12.9 mL/min (by C-G formula based on SCr of 5.85 mg/dL (H)).  Medical History: Past Medical History:  Diagnosis Date  . AAA (abdominal aortic aneurysm) ([place])   . Allergic rhinitis   . Anemia    a. baseline hgb ~ 8  . Bladder cancer ([place])   . Chronic combined systolic and diastolic CHF (congestive heart failure) ([place])    a. echo 12/2014: EF 25-30%, anterior wall wall motion abnormalities; b. planned ischemic evaluation once patient is stable medically; c. echo 01/2015: EF 45-50%, no RWMA, mild AT,  LA mildly dilated, mod pericardial effusion along LV free wall, no evidence of hemodynamic compromise  . Cognitive communication deficit   . COPD (chronic obstructive pulmonary disease) ([place])   . Dementia   . Detrusor sphincter dyssynergia   . Dysrhythmia   . ESRD on hemodialysis ([place])    a. Tuesday, Thursday, and Saturdays  . GERD (gastroesophageal reflux disease)   . History of septic shock    a. 01/2015  . History of small bowel obstruction    a. 01/2015  . Hydronephrosis   . Hypercholesteremia   . Hypertension   . 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 Incontinence   . Incontinence of urine   . Magnesium deficiency   . Mini stroke ([place])   . Overactive bladder   . PAF (paroxysmal atrial fibrillation) ([place])    a. new onset 12/2014 in the setting of UTI, sepsis, hypotension, and anemia; b. not on long term anticoagulation given anemia; c. family aware of stroke risk, they are ok with this; d. on amiodarone   . Prostate cancer ([place])   . Stroke [facility])     Medications:  Prescriptions Prior to Admission  Medication Sig Dispense Refill Last Dose  . albuterol (PROVENTIL) (2.5 MG/3ML) 0.083% nebulizer solution Take  3 mLs (2.5 mg total) by nebulization every 4 (four) hours as needed for wheezing or shortness of breath. 75 mL 12 08/20/2016 at Unknown time  . Amino Acids-Protein Hydrolys (FEEDING SUPPLEMENT, PRO-STAT SUGAR FREE 64,) LIQD Take 30 mLs by mouth every evening.   08/20/2016 at Unknown time  . amiodarone (PACERONE) 200 MG tablet Take 1 tablet (200 mg total) by mouth daily. 30 tablet 5 08/20/2016 at Unknown time  . atorvastatin (LIPITOR) 40 MG tablet Take 40 mg by mouth at bedtime.   08/20/2016 at Unknown time  . budesonide-formoterol (SYMBICORT) 160-4.5 MCG/ACT inhaler Inhale 2 puffs into the lungs 2 (two) times daily.   08/20/2016 at Unknown time  . carvedilol (COREG) 3.125 MG tablet Take 1 tablet (3.125 mg total) by mouth 2 (two) times daily with a meal. 60 tablet 2 08/20/2016 at Unknown time   . diclofenac sodium (VOLTAREN) 1 % GEL Apply 2 g topically 3 (three) times daily.    08/20/2016 at Unknown time  . ferrous sulfate 325 (65 FE) MG tablet Take 325 mg by mouth daily.   08/20/2016 at Unknown time  . folic acid (FOLVITE) A999333 MCG tablet Take 400 mcg by mouth daily.   08/20/2016 at Unknown time  . gabapentin (NEURONTIN) 100 MG capsule Take 100 mg by mouth at bedtime.    08/20/2016 at Unknown time  . guaiFENesin (MUCINEX) 600 MG 12 hr tablet Take 600 mg by mouth 2 (two) times daily. (Take every morning and at bedtime for cough)   08/20/2016 at Unknown time  . guaifenesin (ROBITUSSIN) 100 MG/5ML syrup Take 200 mg by mouth every 4 (four) hours as needed for cough.   Past Week at Unknown time  . ipratropium-albuterol (DUONEB) 0.5-2.5 (3) MG/3ML SOLN Take 3 mLs by nebulization 3 (three) times daily. May inhale an additional  57mls 3 times a day as needed for chronic obstructive pulmonary disease   08/20/2016 at Unknown time  . lamoTRIgine (LAMICTAL) 25 MG tablet Take 50 mg by mouth at bedtime. Start on July 14th.   08/20/2016 at Unknown time  . latanoprost (XALATAN) 0.005 % ophthalmic solution Place 1 drop into both eyes at bedtime.   08/20/2016 at Unknown time  . LORazepam (ATIVAN) 0.5 MG tablet Take 1 tablet (0.5 mg total) by mouth every 4 (four) hours as needed for anxiety. (Patient taking differently: Take 0.5 mg by mouth 2 (two) times daily. May take an additional 0.5mg  every 4 hours as needed for anxiety) 30 tablet 0 08/20/2016 at Unknown time  . magnesium oxide (MAG-OX) 400 MG tablet Take 1 tablet (400 mg total) by mouth every evening. 30 tablet 2 08/20/2016 at Unknown time  . omeprazole (PRILOSEC) 20 MG capsule Take 20 mg by mouth daily.   08/20/2016 at Unknown time  . ondansetron (ZOFRAN) 8 MG tablet Take by mouth every 8 (eight) hours as needed for nausea.   08/20/2016 at Unknown time  . oxyCODONE (OXY IR/ROXICODONE) 5 MG immediate release tablet Take 1 tablet (5 mg total) by mouth  every 6 (six) hours as needed for moderate pain or severe pain. 20 tablet 0 08/20/2016 at Unknown time  . OXYGEN Inhale 2-3 L into the lungs as needed. For shortness of breath, respiratory distress or O2 sats <90.   08/21/2016 at Unknown time  . sevelamer carbonate (RENVELA) 800 MG tablet Take 800 mg by mouth 3 (three) times daily.    08/20/2016 at Unknown time  . tiotropium (SPIRIVA) 18 MCG inhalation capsule Place 1 capsule (18  mcg total) into inhaler and inhale daily. 30 capsule 12 08/20/2016 at Unknown time  . [DISCONTINUED] acetaminophen (TYLENOL) 325 MG tablet Take 650 mg by mouth every 8 (eight) hours as needed. For general discomfort   Past Week at Unknown time   Scheduled:  . amiodarone  200 mg Oral Daily  . atorvastatin  40 mg Oral QHS  . carvedilol  3.125 mg Oral BID WC  . docusate sodium  100 mg Oral Daily  . epoetin (EPOGEN/PROCRIT) injection  10,000 Units Intravenous Q T,Th,Sa-HD  . feeding supplement (PRO-STAT SUGAR FREE 64)  30 mL Oral QPM  . ferrous sulfate  325 mg Oral Daily  . folic acid  [id number] mcg Oral Daily  . gabapentin  100 mg Oral QHS  . guaiFENesin  600 mg Oral BID  . ipratropium-albuterol  3 mL Nebulization TID  . lamoTRIgine  50 mg Oral QHS  . latanoprost  1 drop Both Eyes QHS  . magnesium oxide  400 mg Oral QPM  . mouth rinse  15 mL Mouth Rinse BID  . pantoprazole  40 mg Oral Daily  . sevelamer carbonate  800 mg Oral TID WC  . tiotropium  18 mcg Inhalation Daily  . Warfarin - Physician Dosing Inpatient   Does not apply q1800   Infusions:  . heparin 2,250 Units/hr (08/26/16 1115)  . 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   Baseline labs have been ordered STAT.  11/29 Case was discussed with vascular surgeon Dr. Delana Meyer - gave the patient an initial bolus of 30  units/kg and target a HL of 0.3 - 0.5 units/mL  11/30 01:00 heparin level <0.1. 2400 unit bolus and increase rate to 1300 units/hr. Recheck in 8 hours.  Goal of Therapy:  Heparin level 0.3-0.5 units/ml Monitor platelets by anticoagulation protocol: Yes   Plan:  12/1 0800 HL =0.35 (goal 0.3-0.5)  RN called around 1030 stating that pt was bleeding from IV site. She turned drip off and removed IV-held pressure to site. Per Dr. Delana Meyer ok to resume drip at same rate (1650 units/hr) at different IV site once bleeding has stopped. RN to restart drip at 1100 Will recheck level in 8 hours @ 1900  12/1 20:00 heparin level 0.35. Continue current regimen. Recheck with tomorrow AM labs.  12/2 AM heparin level 0.21. 1200 unit bolus and increase rate to 1800 units/hr. Recheck in 8 hours.  12/2 PM Heparin level 0.26. Will increase drip heparin 1950 units/hr. Recheck level in 8 hours.   12/3 01:00 heparin level 0.22. 1200 unit bolus and increase rate to 2100 units/hr. Recheck in 8 hours.  12/3 @ 12:28  HL = 0.44. Continue current drip rate. Recheck in 8 hours at 20:30.  12/3 @ 2008 HL= 0.39 Continue current drip rate. Recheck HL with am labs.   12/3 AM heparin level 0.28. 1200 unit bolus and increase rate to 2250 units/hr. Recheck in 8 hours.  12/4  HL @ 15:00 = 3.76.   Previous level was 0.28.  Most likely sample was drawn from same line as heparin infusion.  Will repeat HL and adjust based on that value.   Orene Desanctis, PharmD Clinical Pharmacist 08/26/2016 5:01 PM

## 2016-08-27 DIAGNOSIS — I70223 Atherosclerosis of native arteries of extremities with rest pain, bilateral legs: Secondary | ICD-10-CM

## 2016-08-27 LAB — CBC
HEMATOCRIT: 28.3 % — AB (ref 40.0–52.0)
HEMATOCRIT: 25.9 % — AB (ref 40.0–52.0)
HEMOGLOBIN: 8.6 g/dL — AB (ref 13.0–18.0)
HEMOGLOBIN: 8.1 g/dL — AB (ref 13.0–18.0)
MCH: 25 pg — AB (ref 26.0–34.0)
MCH: 25.6 pg — ABNORMAL LOW (ref 26.0–34.0)
MCHC: 30.4 g/dL — ABNORMAL LOW (ref 32.0–36.0)
MCHC: 31.5 g/dL — ABNORMAL LOW (ref 32.0–36.0)
MCV: 82.3 fL (ref 80.0–100.0)
MCV: 81.4 fL (ref 80.0–100.0)
Platelets: 156 10*3/uL (ref 150–440)
Platelets: 157 10*3/uL (ref 150–440)
RBC: 3.44 MIL/uL — AB (ref 4.40–5.90)
RBC: 3.18 MIL/uL — AB (ref 4.40–5.90)
RDW: 18.2 % — ABNORMAL HIGH (ref 11.5–14.5)
RDW: 18 % — AB (ref 11.5–14.5)
WBC: 13.7 10*3/uL — ABNORMAL HIGH (ref 3.8–10.6)
WBC: 13.6 10*3/uL — AB (ref 3.8–10.6)

## 2016-08-27 LAB — RENAL FUNCTION PANEL
ALBUMIN: 2.3 g/dL — AB (ref 3.5–5.0)
ANION GAP: 14 (ref 5–15)
BUN: 78 mg/dL — ABNORMAL HIGH (ref 6–20)
CHLORIDE: 95 mmol/L — AB (ref 101–111)
CO2: 25 mmol/L (ref 22–32)
Calcium: 8.2 mg/dL — ABNORMAL LOW (ref 8.9–10.3)
Creatinine, Ser: 7.06 mg/dL — ABNORMAL HIGH (ref 0.61–1.24)
GFR, EST AFRICAN AMERICAN: 8 mL/min — AB (ref >60)
GFR, EST NON AFRICAN AMERICAN: 7 mL/min — AB (ref >60)
Glucose, Bld: 122 mg/dL — ABNORMAL HIGH (ref 65–99)
PHOSPHORUS: 6.2 mg/dL — AB (ref 2.5–4.6)
POTASSIUM: 4.7 mmol/L (ref 3.5–5.1)
Sodium: 134 mmol/L — ABNORMAL LOW (ref 135–145)

## 2016-08-27 LAB — HEPARIN LEVEL (UNFRACTIONATED): Heparin Unfractionated: 0.34 IU/mL (ref 0.30–0.70)

## 2016-08-27 LAB — PROTIME-INR
INR: 1.8
PROTHROMBIN TIME: 21.1 s — AB (ref 11.4–15.2)

## 2016-08-27 NOTE — Progress Notes (Signed)
Pt returns from HD, VSS, refuses lunch, complaining of generalized pain 8/10, I will treat with prn medication.  I will continue to assess.

## 2016-08-27 NOTE — Progress Notes (Signed)
Pt reports not being able to feel his left leg and the leg is cool to touch. MD notified. MD acknowledged and states that this is an ongong issue and not new to the patient. I will notify night shift RN.

## 2016-08-27 NOTE — Progress Notes (Signed)
[facility]  Daily Progress Note   Subjective  - 6 Days Post-Op  Patient sleeping comfortably when I entered the room. Upon waking he says his right foot feels fine does not mention the left foot this evening  Objective Vitals:   08/27/16 1321 08/27/16 1324 08/27/16 1348 08/27/16 1457  BP: 98/64 97/67 107/62   Pulse: 82 83 (!) 105   Resp: 15 16 20    Temp: 98.3 F (36.8 C)  98.1 F (36.7 C)   TempSrc: Oral  Oral   SpO2: 99% 100% 97% (!) 85%  Weight: 83 kg (182 lb 15.7 oz)     Height:        Intake/Output Summary (Last 24 hours) at 08/27/16 1808 Last data filed at 08/27/16 1321  Gross per 24 hour  Intake                0 ml  Output             1500 ml  Net            -1500 ml    PULM  Normal effort , no use of accessory muscles CV  No JVD, RRR Abd      No distended, nontender VASC  Right foot warm left foot warm compared to last night, right PT is palpable but right DP is nonpalpable  Laboratory CBC    Component Value Date/Time   WBC 13.6 (H) 08/27/2016 1020   HGB 8.1 (L) 08/27/2016 1020   HGB 6.9 (L) 01/20/2015 0430   HCT 25.9 (L) 08/27/2016 1020   HCT 20.0 (L) 06/08/2015 1300   PLT 157 08/27/2016 1020   PLT 176 01/20/2015 0430    BMET    Component Value Date/Time   NA 134 (L) 08/27/2016 1020   NA 141 01/20/2015 0430   K 4.7 08/27/2016 1020   K 3.6 01/20/2015 0430   CL 95 (L) 08/27/2016 1020   CL 99 (L) 01/20/2015 0430   CO2 25 08/27/2016 1020   CO2 30 01/20/2015 0430   GLUCOSE 122 (H) 08/27/2016 1020   GLUCOSE 119 (H) 01/20/2015 0430   BUN 78 (H) 08/27/2016 1020   BUN 32 (H) 01/20/2015 0430   CREATININE 7.06 (H) 08/27/2016 1020   CREATININE 6.73 (H) 01/20/2015 0430   CALCIUM 8.2 (L) 08/27/2016 1020   CALCIUM 7.8 (L) 01/20/2015 0430   GFRNONAA 7 (L) 08/27/2016 1020   GFRNONAA 7 (L) 01/20/2015 0430   GFRAA 8 (L) 08/27/2016 1020   GFRAA 9 (L) 01/20/2015 0430    Assessment/Planning: POD #5 s/p repair of right popliteal  artery aneurysm   INR still subtherapeutic but better now 1.8 Coumadin unchanged.  With respect to his left lower extremity this will represent technically a much greater challenge they're much fewer options. I have mentioned to this on several occasions I believe he is at high risk for limb loss. He again wants everything done to prevent amputation. However, given his postoperative hypotension I am concerned that interventions will have difficulty staying open his cardiac function is clearly very poor. On the right at least we had patent outflow on the left I believe his tibial outflow is even more compromised. As I told him tonight I would like to get him out of the hospital and assess the status of the right leg.  If this intervention is patent then we will move forward with the left leg.    Hortencia Pilar  08/27/2016, 6:08 PM

## 2016-08-27 NOTE — Progress Notes (Signed)
HD initiated without issue. Pt currently has no complaints. No heparin tx. On Heparin gtt. Cbc renal and heparin level sent to lab.

## 2016-08-27 NOTE — Progress Notes (Signed)
Pt. Slept well throughout the night with c/o pain x1, medicated with effective results. Pt. Turned hourly.

## 2016-08-27 NOTE — Care Management Important Message (Signed)
Important Message  Patient Details  Name: [name] MRN: [id number] Date of Birth: 1941/07/18   Medicare Important Message Given:  Yes    Katrina Stack, RN 08/27/2016, 4:30 PM

## 2016-08-27 NOTE — Progress Notes (Signed)
Post HD assessment  

## 2016-08-27 NOTE — Progress Notes (Signed)
Pre dialysis assessment 

## 2016-08-27 NOTE — Progress Notes (Signed)
HD completed without issue. Able to UF 1.5L. BP stable at baseline. Pt has no complaints

## 2016-08-27 NOTE — Progress Notes (Signed)
Pt has orders to ambulate 3-4 times a day. Pt states he does not ambulate. I will continue to assess.

## 2016-08-27 NOTE — Anesthesia Postprocedure Evaluation (Signed)
Anesthesia Post Note  Patient: [name]  Procedure(s) Performed: Procedure(s) (LRB): LOWER EXTREMITY ANGIOGRAPHY (Right) Lower Extremity Intervention  Patient location during evaluation: Other Anesthesia Type: General Level of consciousness: awake and alert Pain management: pain level controlled Vital Signs Assessment: post-procedure vital signs reviewed and stable Respiratory status: spontaneous breathing, nonlabored ventilation, respiratory function stable and patient connected to nasal cannula oxygen Cardiovascular status: blood pressure returned to baseline and stable Postop Assessment: no signs of nausea or vomiting Anesthetic complications: no    Last Vitals:  Vitals:   08/26/16 2026 08/26/16 2042  BP: (!) 87/57 (!) 89/51  Pulse: 83 63  Resp: 19   Temp: 36.4 C     Last Pain:  Vitals:   08/26/16 2347  TempSrc:   PainSc: [name]

## 2016-08-27 NOTE — Progress Notes (Signed)
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  PMHx:  Stroke, prostate CA, dementia, PAF, COPD    PT Comments    Pt reports pain in back and R hip; pt not positioned well. Pt also noted to have nasal cannula on forehead with low saturation. O2 replaced with recovery after several minutes. Pt requires Max A to roll to left and position lower extremities for improved comfort. Pt pleased with improved comfort/decreased pain. Pt does not feel up to any further therapy today. Offered to return tomorrow for PT; pt agreeable. Continue PT to progress participation, positioning, range, and strength to improve functional mobility.   Follow Up Recommendations  SNF     Equipment Recommendations  None recommended by PT    Recommendations for Other Services       Precautions / Restrictions Restrictions Weight Bearing Restrictions: No    Mobility  Bed Mobility Overal bed mobility: Needs Assistance Bed Mobility: Rolling Rolling: Max assist         General bed mobility comments: Assist with rail to position body to roll L. Max A to position LEs with pillows for hip relief  Transfers                    Ambulation/Gait                 Stairs            Wheelchair Mobility    Modified Rankin (Stroke Patients Only)       Balance                                    Cognition Arousal/Alertness: Awake/alert Behavior During Therapy: WFL for tasks assessed/performed Overall Cognitive Status: Within Functional Limits for tasks assessed                      Exercises      General Comments        Pertinent Vitals/Pain Pain Assessment: Faces Faces Pain Scale: Hurts whole lot Pain Location: back and  R hip Pain Intervention(s): Repositioned    Home Living                      Prior Function            PT Goals (current goals can now be found in the care plan section) Progress towards PT goals: Not progressing toward goals - comment    Frequency    Min 2X/week      PT Plan Current plan remains appropriate    Co-evaluation             End of Session Equipment Utilized During Treatment: Oxygen Activity Tolerance: Patient limited by fatigue Patient left: in bed;with call bell/phone within reach;with bed alarm set     Time: [id number] PT Time Calculation (min) (ACUTE ONLY): 16 min  Charges:  $Therapeutic Activity: 8-22 mins                    G CodesLarae Grooms, PTA  08/27/2016, 3:28 PM

## 2016-08-27 NOTE — Progress Notes (Signed)
ANTICOAGULATION CONSULT NOTE - Initial Consult  Pharmacy Consult for Warfarin Indication: cardiomyopathy  Allergies  Allergen Reactions  . Penicillins Hives, Itching and Swelling    Has patient had a PCN reaction causing immediate rash, facial/tongue/throat swelling, SOB or lightheadedness with hypotension: Yes Has patient had a PCN reaction causing severe rash involving mucus membranes or skin necrosis: No Has patient had a PCN reaction that required hospitalization No Has patient had a PCN reaction occurring within the last 10 years: No If all of the above answers are "NO", then may proceed with Cephalosporin use.    Patient Measurements: Height: 6\' 2"  (188 cm) Weight: 185 lb 3 oz (84 kg) IBW/kg (Calculated) : 82.2  Vital Signs: Temp: 97.6 F (36.4 C) (12/04 2026) Temp Source: Oral (12/04 2026) BP: 89/51 (12/04 2042) Pulse Rate: 63 (12/04 2042)  Labs:  Recent Labs  08/25/16 0051  08/26/16 0458 08/26/16 1517 08/26/16 1712 08/27/16 0558  HGB 9.2*  --  8.6*  --   --   --   HCT 30.1*  --  27.7*  --   --   --   PLT 132*  --  141*  --   --   --   LABPROT 17.2*  --  17.7*  --   --  21.1*  INR 1.39  --  1.44  --   --  1.80  HEPARINUNFRC 0.22*  < > 0.28* >3.60* 0.35 0.34  < > = values in this interval not displayed.  Estimated Creatinine Clearance: 12.9 mL/min (by C-G formula based on SCr of 5.85 mg/dL (H)).   Medical History: Past Medical History:  Diagnosis Date  . AAA (abdominal aortic aneurysm) ([place])   . Allergic rhinitis   . Anemia    a. baseline hgb ~ 8  . Bladder cancer ([place])   . Chronic combined systolic and diastolic CHF (congestive heart failure) ([place])    a. echo 12/2014: EF 25-30%, anterior wall wall motion abnormalities; b. planned ischemic evaluation once patient is stable medically; c. echo 01/2015: EF 45-50%, no RWMA, mild AT, LA mildly dilated, mod pericardial effusion along LV free wall, no evidence of hemodynamic compromise  . Cognitive communication  deficit   . COPD (chronic obstructive pulmonary disease) ([place])   . Dementia   . Detrusor sphincter dyssynergia   . Dysrhythmia   . ESRD on hemodialysis ([place])    a. Tuesday, Thursday, and Saturdays  . GERD (gastroesophageal reflux disease)   . History of septic shock    a. 01/2015  . History of small bowel obstruction    a. 01/2015  . Hydronephrosis   . Hypercholesteremia   . Hypertension   . 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 Incontinence   . Incontinence of urine   . Magnesium deficiency   . Mini stroke ([place])   . Overactive bladder   . PAF (paroxysmal atrial fibrillation) ([place])    a. new onset 12/2014 in the setting of UTI, sepsis, hypotension, and anemia; b. not on long term anticoagulation given anemia; c. family aware of stroke risk, they are ok with this; d. on amiodarone   . Prostate cancer ([place])   . Stroke [facility])     Medications:  Scheduled:  . amiodarone  200 mg Oral Daily  . atorvastatin  40 mg Oral QHS  . carvedilol  3.125 mg Oral BID WC  . docusate sodium  100 mg Oral Daily  . epoetin (EPOGEN/PROCRIT) injection  10,000 Units Intravenous Q T,Th,Sa-HD  . feeding supplement (  PRO-STAT SUGAR FREE 64)  30 mL Oral QPM  . ferrous sulfate  325 mg Oral Daily  . folic acid  [id number] mcg Oral Daily  . gabapentin  100 mg Oral QHS  . guaiFENesin  600 mg Oral BID  . ipratropium-albuterol  3 mL Nebulization TID  . lamoTRIgine  50 mg Oral QHS  . latanoprost  1 drop Both Eyes QHS  . magnesium oxide  400 mg Oral QPM  . mouth rinse  15 mL Mouth Rinse BID  . pantoprazole  40 mg Oral Daily  . sevelamer carbonate  800 mg Oral TID WC  . tiotropium  18 mcg Inhalation Daily  . warfarin  5 mg Oral q1800   Infusions:  . heparin 2,250 Units/hr (08/26/16 2255)  . phenylephrine (NEO-SYNEPHRINE) Adult infusion Stopped (08/22/16 0701)    Assessment: Pharmacy consulted to dose and monitor warfarin for this 74 yo male for cardiomyopathy and PAD s/p repair of popliteal artery aneurysm.  Patient has been  on heparin drip since 11/29. Patient is also on amiodarone which can increase the effects of warfarin. Lower starting dose has been used due to this DDI  12/1  INR= 1.13  Warfarin 2.5mg  given 12/2 INR = 1.22  Warfarin 2.5mg  12/3 INR= 1.39   Warfarin 2.5mg  12/4 INR= 1.44   Warfarin 5mg  12/5 INR= 1.80    Goal of Therapy:  INR 2-3 Monitor platelets by anticoagulation protocol: Yes   Plan:  INR still subtherapeutic, but approaching therapeutic with dose increase last night. Full effect seen after about 3 days. Continue 5mg  daily. Recheck INR in the AM.   Ramond Dial, Pharm.D, BCPS Clinical Pharmacist  08/27/2016,7:39 AM

## 2016-08-27 NOTE — Progress Notes (Signed)
Pre dialysis  

## 2016-08-27 NOTE — Progress Notes (Signed)
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 Penicillins Hives, Itching and Swelling    Has patient had a PCN reaction causing immediate rash, facial/tongue/throat swelling, SOB or lightheadedness with hypotension: Yes Has patient had a PCN reaction causing severe rash involving mucus membranes or skin necrosis: No Has patient had a PCN reaction that required hospitalization No Has patient had a PCN reaction occurring within the last 10 years: No If all of the above answers are "NO", then may proceed with Cephalosporin use.    Patient Measurements: Height: 6\' 2"  (188 cm) Weight: 185 lb 3 oz (84 kg) IBW/kg (Calculated) : 82.2 Heparin Dosing Weight: 80 kg  Vital Signs: Temp: 97.6 F (36.4 C) (12/04 2026) Temp Source: Oral (12/04 2026) BP: 89/51 (12/04 2042) Pulse Rate: 63 (12/04 2042)  Labs:  Recent Labs  08/25/16 0051  08/26/16 0458 08/26/16 1517 08/26/16 1712 08/27/16 0558  HGB 9.2*  --  8.6*  --   --   --   HCT 30.1*  --  27.7*  --   --   --   PLT 132*  --  141*  --   --   --   LABPROT 17.2*  --  17.7*  --   --  21.1*  INR 1.39  --  1.44  --   --  1.80  HEPARINUNFRC 0.22*  < > 0.28* >3.60* 0.35 0.34  < > = values in this interval not displayed.  Estimated Creatinine Clearance: 12.9 mL/min (by C-G formula based on SCr of 5.85 mg/dL (H)).  Medical History: Past Medical History:  Diagnosis Date  . AAA (abdominal aortic aneurysm) ([place])   . Allergic rhinitis   . Anemia    a. baseline hgb ~ 8  . Bladder cancer ([place])   . Chronic combined systolic and diastolic CHF (congestive heart failure) ([place])    a. echo 12/2014: EF 25-30%, anterior wall wall motion abnormalities; b. planned ischemic evaluation once patient is stable medically; c. echo 01/2015: EF 45-50%, no RWMA, mild AT, LA mildly dilated, mod pericardial  effusion along LV free wall, no evidence of hemodynamic compromise  . Cognitive communication deficit   . COPD (chronic obstructive pulmonary disease) ([place])   . Dementia   . Detrusor sphincter dyssynergia   . Dysrhythmia   . ESRD on hemodialysis ([place])    a. Tuesday, Thursday, and Saturdays  . GERD (gastroesophageal reflux disease)   . History of septic shock    a. 01/2015  . History of small bowel obstruction    a. 01/2015  . Hydronephrosis   . Hypercholesteremia   . Hypertension   . 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 Incontinence   . Incontinence of urine   . Magnesium deficiency   . Mini stroke ([place])   . Overactive bladder   . PAF (paroxysmal atrial fibrillation) ([place])    a. new onset 12/2014 in the setting of UTI, sepsis, hypotension, and anemia; b. not on long term anticoagulation given anemia; c. family aware of stroke risk, they are ok with this; d. on amiodarone   . Prostate cancer ([place])   . Stroke [facility])     Medications:  Prescriptions Prior to Admission  Medication Sig Dispense Refill Last Dose  . albuterol (PROVENTIL) (2.5 MG/3ML) 0.083% nebulizer solution Take 3 mLs (2.5 mg total) by nebulization every 4 (four) hours as needed  for wheezing or shortness of breath. 75 mL 12 08/20/2016 at Unknown time  . Amino Acids-Protein Hydrolys (FEEDING SUPPLEMENT, PRO-STAT SUGAR FREE 64,) LIQD Take 30 mLs by mouth every evening.   08/20/2016 at Unknown time  . amiodarone (PACERONE) 200 MG tablet Take 1 tablet (200 mg total) by mouth daily. 30 tablet 5 08/20/2016 at Unknown time  . atorvastatin (LIPITOR) 40 MG tablet Take 40 mg by mouth at bedtime.   08/20/2016 at Unknown time  . budesonide-formoterol (SYMBICORT) 160-4.5 MCG/ACT inhaler Inhale 2 puffs into the lungs 2 (two) times daily.   08/20/2016 at Unknown time  . carvedilol (COREG) 3.125 MG tablet Take 1 tablet (3.125 mg total) by mouth 2 (two) times daily with a meal. 60 tablet 2 08/20/2016 at Unknown time  . diclofenac sodium (VOLTAREN) 1 %  GEL Apply 2 g topically 3 (three) times daily.    08/20/2016 at Unknown time  . ferrous sulfate 325 (65 FE) MG tablet Take 325 mg by mouth daily.   08/20/2016 at Unknown time  . folic acid (FOLVITE) [id number] MCG tablet Take 400 mcg by mouth daily.   08/20/2016 at Unknown time  . gabapentin (NEURONTIN) 100 MG capsule Take 100 mg by mouth at bedtime.    08/20/2016 at Unknown time  . guaiFENesin (MUCINEX) 600 MG 12 hr tablet Take 600 mg by mouth 2 (two) times daily. (Take every morning and at bedtime for cough)   08/20/2016 at Unknown time  . guaifenesin (ROBITUSSIN) 100 MG/5ML syrup Take 200 mg by mouth every 4 (four) hours as needed for cough.   Past Week at Unknown time  . ipratropium-albuterol (DUONEB) 0.5-2.5 (3) MG/3ML SOLN Take 3 mLs by nebulization 3 (three) times daily. May inhale an additional  46mls 3 times a day as needed for chronic obstructive pulmonary disease   08/20/2016 at Unknown time  . lamoTRIgine (LAMICTAL) 25 MG tablet Take 50 mg by mouth at bedtime. Start on July 14th.   08/20/2016 at Unknown time  . latanoprost (XALATAN) 0.005 % ophthalmic solution Place 1 drop into both eyes at bedtime.   08/20/2016 at Unknown time  . LORazepam (ATIVAN) 0.5 MG tablet Take 1 tablet (0.5 mg total) by mouth every 4 (four) hours as needed for anxiety. (Patient taking differently: Take 0.5 mg by mouth 2 (two) times daily. May take an additional 0.5mg  every 4 hours as needed for anxiety) 30 tablet 0 08/20/2016 at Unknown time  . magnesium oxide (MAG-OX) 400 MG tablet Take 1 tablet (400 mg total) by mouth every evening. 30 tablet 2 08/20/2016 at Unknown time  . omeprazole (PRILOSEC) 20 MG capsule Take 20 mg by mouth daily.   08/20/2016 at Unknown time  . ondansetron (ZOFRAN) 8 MG tablet Take by mouth every 8 (eight) hours as needed for nausea.   08/20/2016 at Unknown time  . oxyCODONE (OXY IR/ROXICODONE) 5 MG immediate release tablet Take 1 tablet (5 mg total) by mouth every 6 (six) hours as needed for  moderate pain or severe pain. 20 tablet 0 08/20/2016 at Unknown time  . OXYGEN Inhale 2-3 L into the lungs as needed. For shortness of breath, respiratory distress or O2 sats <90.   08/21/2016 at Unknown time  . sevelamer carbonate (RENVELA) 800 MG tablet Take 800 mg by mouth 3 (three) times daily.    08/20/2016 at Unknown time  . tiotropium (SPIRIVA) 18 MCG inhalation capsule Place 1 capsule (18 mcg total) into inhaler and inhale daily. 30 capsule 12 08/20/2016 at Unknown  time  . [DISCONTINUED] acetaminophen (TYLENOL) 325 MG tablet Take 650 mg by mouth every 8 (eight) hours as needed. For general discomfort   Past Week at Unknown time   Scheduled:  . amiodarone  200 mg Oral Daily  . atorvastatin  40 mg Oral QHS  . carvedilol  3.125 mg Oral BID WC  . docusate sodium  100 mg Oral Daily  . epoetin (EPOGEN/PROCRIT) injection  10,000 Units Intravenous Q T,Th,Sa-HD  . feeding supplement (PRO-STAT SUGAR FREE 64)  30 mL Oral QPM  . ferrous sulfate  325 mg Oral Daily  . folic acid  [id number] mcg Oral Daily  . gabapentin  100 mg Oral QHS  . guaiFENesin  600 mg Oral BID  . ipratropium-albuterol  3 mL Nebulization TID  . lamoTRIgine  50 mg Oral QHS  . latanoprost  1 drop Both Eyes QHS  . magnesium oxide  400 mg Oral QPM  . mouth rinse  15 mL Mouth Rinse BID  . pantoprazole  40 mg Oral Daily  . sevelamer carbonate  800 mg Oral TID WC  . tiotropium  18 mcg Inhalation Daily  . warfarin  5 mg Oral q1800   Infusions:  . heparin 2,250 Units/hr (08/26/16 2255)  . 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   Baseline labs have been ordered STAT.  11/29 Case was discussed with vascular surgeon Dr. Delana Meyer - gave the patient an initial bolus of 30 units/kg and target a HL of 0.3 - 0.5 units/mL  11/30 01:00 heparin level  <0.1. 2400 unit bolus and increase rate to 1300 units/hr. Recheck in 8 hours.  Goal of Therapy:  Heparin level 0.3-0.5 units/ml Monitor platelets by anticoagulation protocol: Yes   Plan:  Heparin level=0.35, within goal. Continue drip at 2250 units/hr. This is the second therapeutic level in a row, therefore recheck in the AM along with a CBC.   Ramond Dial, Pharm.D, BCPS Clinical Pharmacist  08/27/2016 7:33 AM

## 2016-08-27 NOTE — Progress Notes (Signed)
[facility]  ROUNDING NOTE   Subjective:   Seen and examined on hemodialysis. Tolerating treatment well. UF goal of 1 litre    HEMODIALYSIS FLOWSHEET:  Blood Flow Rate (mL/min): 400 mL/min Arterial Pressure (mmHg): -160 mmHg Venous Pressure (mmHg): 160 mmHg Transmembrane Pressure (mmHg): 50 mmHg Ultrafiltration Rate (mL/min): 520 mL/min Dialysate Flow Rate (mL/min): 600 ml/min Conductivity: Machine : 14 Conductivity: Machine : 14 Dialysis Fluid Bolus: Normal Saline Bolus Amount (mL): 100 mL Dialysate Change:  (3k 2.5ca) Intra-Hemodialysis Comments: 887. sleeping    Objective:  Vital signs in last 24 hours:  Temp:  [97.6 F (36.4 C)-100.4 F (38 C)] 98.2 F (36.8 C) (12/05 1012) Pulse Rate:  [63-93] 84 (12/05 1157) Resp:  [14-19] 16 (12/05 1157) BP: (72-100)/(43-67) 99/67 (12/05 1157) SpO2:  [97 %-100 %] 100 % (12/05 1157) Weight:  [84.6 kg (186 lb 8.2 oz)] 84.6 kg (186 lb 8.2 oz) (12/05 1012)  Weight change:  Filed Weights   08/24/16 1235 08/24/16 1555 08/27/16 1012  Weight: 84.6 kg (186 lb 8.2 oz) 84 kg (185 lb 3 oz) 84.6 kg (186 lb 8.2 oz)    Intake/Output: I/O last 3 completed shifts: In: 120 [P.O.:120] Out: 0    Intake/Output this shift:  No intake/output data recorded.  Physical Exam: General: No acute distress  Head: Normocephalic, atraumatic. Moist oral mucosal membranes  Eyes: Anicteric  Neck: Supple, trachea midline  Lungs:  Scattered rhonchi, normal effort  Heart: S1S2 irregular  Abdomen:  Soft, nontender, bowel sounds present   Extremities:  no peripheral edema.  Neurologic: Nonfocal, moving all four extremities  Skin: No lesions  Access: LUE AVF    Basic Metabolic Panel:  Recent Labs Lab 08/21/16 2015 08/24/16 0603 08/27/16 1020  NA  --   --  134*  K  --   --  4.7  CL  --   --  95*  CO2  --   --  25  GLUCOSE  --   --  122*  BUN  --   --  78*  CREATININE 5.85*  --  7.06*  CALCIUM  --   --  8.2*  PHOS  --  4.4  6.2*    Liver Function Tests:  Recent Labs Lab 08/27/16 1020  ALBUMIN 2.3*   No results for input(s): LIPASE, AMYLASE in the last 168 hours. No results for input(s): AMMONIA in the last 168 hours.  CBC:  Recent Labs Lab 08/24/16 0603 08/25/16 0051 08/26/16 0458 08/27/16 0556 08/27/16 1020  WBC 8.6 8.7 11.4* 13.7* 13.6*  HGB 9.4* 9.2* 8.6* 8.6* 8.1*  HCT 30.7* 30.1* 27.7* 28.3* 25.9*  MCV 82.5 82.5 81.1 82.3 81.4  PLT 169 132* 141* 156 157    Cardiac Enzymes: No results for input(s): CKTOTAL, CKMB, CKMBINDEX, TROPONINI in the last 168 hours.  BNP: Invalid input(s): POCBNP  CBG:  Recent Labs Lab 08/22/16 1557 08/25/16 0748 08/25/16 1247 08/25/16 1636 08/26/16 0751  GLUCAP 132* 86 133* 131* 91    Microbiology: Results for orders placed or performed during the hospital encounter of 08/21/16  MRSA PCR Screening     Status: None   Collection Time: 08/21/16 11:00 AM  Result Value Ref Range Status   MRSA by PCR NEGATIVE NEGATIVE Final    Comment:        The GeneXpert MRSA Assay (FDA approved for NASAL specimens only), is one component of a comprehensive MRSA colonization surveillance program. It is not intended to diagnose MRSA infection nor to guide  or monitor treatment for MRSA infections.     Coagulation Studies:  Recent Labs  08/25/16 0051 08/26/16 0458 08/27/16 0558  LABPROT 17.2* 17.7* 21.1*  INR 1.39 1.44 1.80    Urinalysis: No results for input(s): COLORURINE, LABSPEC, PHURINE, GLUCOSEU, HGBUR, BILIRUBINUR, KETONESUR, PROTEINUR, UROBILINOGEN, NITRITE, LEUKOCYTESUR in the last 72 hours.  Invalid input(s): APPERANCEUR    Imaging: No results found.   Medications:   . heparin 2,250 Units/hr (08/27/16 1102)  . phenylephrine (NEO-SYNEPHRINE) Adult infusion Stopped (08/22/16 0701)   . amiodarone  200 mg Oral Daily  . atorvastatin  40 mg Oral QHS  . carvedilol  3.125 mg Oral BID WC  . docusate sodium  100 mg Oral Daily  . epoetin  (EPOGEN/PROCRIT) injection  10,000 Units Intravenous Q T,Th,Sa-HD  . feeding supplement (PRO-STAT SUGAR FREE 64)  30 mL Oral QPM  . ferrous sulfate  325 mg Oral Daily  . folic acid  [id number] mcg Oral Daily  . gabapentin  100 mg Oral QHS  . guaiFENesin  600 mg Oral BID  . ipratropium-albuterol  3 mL Nebulization TID  . lamoTRIgine  50 mg Oral QHS  . latanoprost  1 drop Both Eyes QHS  . magnesium oxide  400 mg Oral QPM  . mouth rinse  15 mL Mouth Rinse BID  . pantoprazole  40 mg Oral Daily  . sevelamer carbonate  800 mg Oral TID WC  . tiotropium  18 mcg Inhalation Daily  . 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  ESRD on HD TTS: seen and examined on hemodialysis. Tolerating treatment well.   2. Peripheral Vascular Disease:  Status post angiogram and Viabahn stent graft placement. - transitioning to warfarin  3.  Anemia chronic kidney disease.  Hemoglobin 8.6.  - Continue Epogen 10,000 units IV with dialysis. - fe sulfate  4.  Secondary hyperparathyroidism.  - Continue Renvela 800 mg by mouth 3 times a day with meals.   5. Hypertension with atrial fibrillation: blood pressure at goal.  - carvedilol   LOS: [place], [place] 12/5/201712:27 PM

## 2016-08-28 DIAGNOSIS — I70223 Atherosclerosis of native arteries of extremities with rest pain, bilateral legs: Secondary | ICD-10-CM

## 2016-08-28 LAB — CBC
HCT: 26.7 % — ABNORMAL LOW (ref 40.0–52.0)
Hemoglobin: 8.3 g/dL — ABNORMAL LOW (ref 13.0–18.0)
MCH: 25.5 pg — AB (ref 26.0–34.0)
MCHC: 31.2 g/dL — ABNORMAL LOW (ref 32.0–36.0)
MCV: 81.8 fL (ref 80.0–100.0)
PLATELETS: 146 10*3/uL — AB (ref 150–440)
RBC: 3.26 MIL/uL — AB (ref 4.40–5.90)
RDW: 17.9 % — ABNORMAL HIGH (ref 11.5–14.5)
WBC: 12.2 10*3/uL — AB (ref 3.8–10.6)

## 2016-08-28 LAB — HEPARIN LEVEL (UNFRACTIONATED): HEPARIN UNFRACTIONATED: 0.42 [IU]/mL (ref 0.30–0.70)

## 2016-08-28 LAB — PROTIME-INR
INR: 2.34
PROTHROMBIN TIME: 26.1 s — AB (ref 11.4–15.2)

## 2016-08-28 MED ORDER — WARFARIN SODIUM 4 MG PO TABS: 4.0000 mg | ORAL_TABLET | Freq: Every day | ORAL | 6 refills | Status: AC

## 2016-08-28 MED ORDER — WARFARIN - PHARMACIST DOSING INPATIENT: Freq: Every day | Status: DC

## 2016-08-28 MED ORDER — WARFARIN SODIUM 1 MG PO TABS
4.0000 mg | ORAL_TABLET | Freq: Every day | ORAL | Status: DC
  Administered 2016-08-28: 4 mg via ORAL
  Filled 2016-08-28: qty 4

## 2016-08-28 NOTE — Progress Notes (Signed)
Telephoned facility [facility] to give report nurse waiting on hold 5-10 minutes each time to give report. Notified operator on 3rd attempt that transport has been called.

## 2016-08-28 NOTE — Clinical Social Work Note (Addendum)
Patient to be d/c'ed today to [facility].  Patient and family agreeable to plans will transport via ems RN to call report to [phone].  MSW notified patient's relative [name].  Evette Cristal, MSW Mon-Fri 8a-4:30p [phone]

## 2016-08-28 NOTE — Care Management (Signed)
Spoke with attending.  INR is therapeutic and heparin drip to be stopped.  Dr Delana Meyer will discharge back to skilled nursing today.  notified Alda Lea with [facility] [employer].  Notified CSW

## 2016-08-28 NOTE — NC FL2 (Signed)
[place] LEVEL OF CARE SCREENING TOOL     IDENTIFICATION  Patient Name: [name] Birthdate: 1941/05/21 Sex: male Admission Date (Current Location): 08/21/2016  [place] and [place] Number:  [name] [id number] [place] and Address:  [facility], [address] [place], [place] [address]      Provider Number: [id number]  Attending Physician Name and Address:  Katha Cabal, MD  Relative Name and Phone Number:  Cousin,Ida Relative [phone]  [phone] or Hinton,Shaneka Relative [phone]     Current Level of Care: Hospital Recommended Level of Care: [place] Prior Approval Number:    Date Approved/Denied:   PASRR Number: [id number]  Discharge Plan: SNF    Current Diagnoses: Patient Active Problem List   Diagnosis Date Noted  . Pressure injury of skin 08/22/2016  . Pseudoaneurysm of right femoral artery ([place]) 08/19/2016  . 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 History of prostate cancer 02/12/2016  . COPD (chronic obstructive pulmonary disease) ([place])   . 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 COPD (chronic obstructive pulmonary disease) with chronic bronchitis ([place]) 11/06/2015  . Chronic diastolic heart failure ([place]) 08/11/2015  . Generalized weakness 05/08/2015  . Other emphysema ([place]) 05/08/2015  . Tobacco use disorder 05/08/2015  . Hypokalemia 05/08/2015  . Pressure ulcer 05/05/2015  . Symptomatic anemia 05/04/2015  . CAFL (chronic airflow limitation) ([place]) 04/10/2015  . Malnutrition of moderate degree ([place]) 03/22/2015  . Incontinence 02/27/2015  . Persistent atrial fibrillation ([place]) 02/27/2015  . Preoperative cardiovascular examination 02/27/2015  . ESRD on hemodialysis ([place])   . Essential (primary) hypertension 03/01/2014  . 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 Cystitis 11/11/2012  . AAA (abdominal aortic aneurysm) without rupture ([place]) 08/27/2012  .  Hydronephrosis 08/27/2012  . Bladder cancer ([place]) 08/27/2012  . CA of prostate ([place]) 08/27/2012  . Bone metastases ([place]) 08/27/2012    Orientation RESPIRATION BLADDER Height & Weight     Self, Time, Situation, Place  O2 Continent Weight: 182 lb 15.7 oz (83 kg) Height:  6\' 2"  (188 cm)  BEHAVIORAL SYMPTOMS/MOOD NEUROLOGICAL BOWEL NUTRITION STATUS      Continent Diet (Renal Diet)  AMBULATORY STATUS COMMUNICATION OF NEEDS Skin   Limited Assist Verbally PU Stage and Appropriate Care     PU Stage 3 Dressing: Daily                 Personal Care Assistance Level of Assistance  Bathing, Feeding, Dressing Bathing Assistance: Limited assistance Feeding assistance: Limited assistance Dressing Assistance: Limited assistance     Functional Limitations Info  Sight, Hearing, Speech Sight Info: Adequate Hearing Info: Adequate Speech Info: Adequate    SPECIAL CARE FACTORS FREQUENCY  PT (By licensed PT)     PT Frequency: Minimum 2x a week              Contractures Contractures Info: Not present    Additional Factors Info  Allergies   Allergies Info: Penicillins           Current Medications (08/28/2016):  This is the current hospital active medication list Current Facility-Administered Medications  Medication Dose Route Frequency Provider Last Rate Last Dose  . 0.9 %  sodium chloride infusion  100 mL Intravenous PRN Munsoor Lateef, MD      . 0.9 %  sodium chloride infusion  100 mL Intravenous PRN Munsoor Lateef, MD      . acetaminophen (TYLENOL) tablet 325-650 mg  325-650 mg Oral Q4H PRN Katha Cabal, MD  650 mg at 08/26/16 1236   Or  . acetaminophen (TYLENOL) suppository 325-650 mg  325-650 mg Rectal Q4H PRN Katha Cabal, MD      . albuterol (PROVENTIL) (2.5 MG/3ML) 0.083% nebulizer solution 2.5 mg  2.5 mg Nebulization Q4H PRN Katha Cabal, MD      . alteplase (CATHFLO ACTIVASE) injection 2 mg  2 mg Intracatheter Once PRN Munsoor Lateef, MD      . alum  & mag hydroxide-simeth (MAALOX/MYLANTA) 200-200-20 MG/5ML suspension 15-30 mL  15-30 mL Oral Q2H PRN Katha Cabal, MD      . amiodarone (PACERONE) tablet 200 mg  200 mg Oral Daily Katha Cabal, MD   200 mg at 08/28/16 [id number]  . atorvastatin (LIPITOR) tablet 40 mg  40 mg Oral QHS Katha Cabal, MD   40 mg at 08/27/16 2147  . carvedilol (COREG) tablet 3.125 mg  3.125 mg Oral BID WC Katha Cabal, MD   3.125 mg at 08/28/16 0849  . docusate sodium (COLACE) capsule 100 mg  100 mg Oral Daily Katha Cabal, MD   100 mg at 08/28/16 [id number]  . epoetin alfa (EPOGEN,PROCRIT) injection 10,000 Units  10,000 Units Intravenous Q T,Th,Sa-HD Munsoor Lateef, MD   10,000 Units at 08/27/16 1046  . feeding supplement (PRO-STAT SUGAR FREE 64) liquid 30 mL  30 mL Oral QPM Katha Cabal, MD   30 mL at 08/27/16 1735  . ferrous sulfate tablet 325 mg  325 mg Oral Daily Katha Cabal, MD   325 mg at 08/28/16 [id number]  . folic acid (FOLVITE) tablet 0.5 mg  500 mcg Oral Daily Katha Cabal, MD   0.5 mg at 08/28/16 [id number]  . gabapentin (NEURONTIN) capsule 100 mg  100 mg Oral QHS Katha Cabal, MD   100 mg at 08/27/16 2148  . guaiFENesin (MUCINEX) 12 hr tablet 600 mg  600 mg Oral BID Katha Cabal, MD   600 mg at 08/28/16 [id number]  . guaifenesin (ROBITUSSIN) 100 MG/5ML syrup 200 mg  200 mg Oral Q4H PRN Katha Cabal, MD      . heparin ADULT infusion 100 units/mL (25000 units/233mL sodium chloride 0.45%)  2,250 Units/hr Intravenous Continuous Katha Cabal, MD 22.5 mL/hr at 08/27/16 2308 2,250 Units/hr at 08/27/16 2308  . heparin injection 1,000 Units  1,000 Units Dialysis PRN Munsoor Lateef, MD      . HYDROmorphone (DILAUDID) injection 0.5-1 mg  0.5-1 mg Intravenous Q2H PRN Katha Cabal, MD      . ipratropium-albuterol (DUONEB) 0.5-2.5 (3) MG/3ML nebulizer solution 3 mL  3 mL Nebulization TID Katha Cabal, MD   3 mL at 08/28/16 0928  . lamoTRIgine (LAMICTAL) tablet 50 mg  50 mg Oral QHS  Katha Cabal, MD   50 mg at 08/27/16 2148  . latanoprost (XALATAN) 0.005 % ophthalmic solution 1 drop  1 drop Both Eyes QHS Katha Cabal, MD   1 drop at 08/27/16 2148  . lidocaine (PF) (XYLOCAINE) 1 % injection 5 mL  5 mL Intradermal PRN Munsoor Lateef, MD      . lidocaine-prilocaine (EMLA) cream 1 application  1 application Topical PRN Munsoor Lateef, MD      . magnesium oxide (MAG-OX) tablet 400 mg  400 mg Oral QPM Katha Cabal, MD   400 mg at 08/27/16 1736  . MEDLINE mouth rinse  15 mL Mouth Rinse BID Katha Cabal, MD   15 mL at 08/28/16 1000  .  ondansetron (ZOFRAN) injection 4 mg  4 mg Intravenous Q6H PRN Katha Cabal, MD      . oxyCODONE (Oxy IR/ROXICODONE) immediate release tablet 5-10 mg  5-10 mg Oral Q4H PRN Katha Cabal, MD   10 mg at 08/27/16 2148  . pantoprazole (PROTONIX) EC tablet 40 mg  40 mg Oral Daily Katha Cabal, MD   40 mg at 08/28/16 [id number]  . pentafluoroprop-tetrafluoroeth (GEBAUERS) aerosol 1 application  1 application Topical PRN Munsoor Lateef, MD      . phenylephrine (NEO-SYNEPHRINE) 10 mg in dextrose 5 % 250 mL (0.04 mg/mL) infusion  0-400 mcg/min Intravenous Titrated Awilda Bill, NP   Stopped at 08/22/16 0701  . sevelamer carbonate (RENVELA) tablet 800 mg  800 mg Oral TID WC Katha Cabal, MD   800 mg at 08/28/16 0843  . tiotropium (SPIRIVA) inhalation capsule 18 mcg  18 mcg Inhalation Daily Katha Cabal, MD   18 mcg at 08/27/16 [phone]  . warfarin (COUMADIN) tablet 4 mg  4 mg Oral q1800 Melissa Burgess Amor, RPH      . Warfarin - Pharmacist Dosing Inpatient   Does not apply [place], [facility]         Discharge Medications: Please see discharge summary for a list of discharge medications.  Relevant Imaging Results:  Relevant Lab Results:   Additional Information SSN [id number]  Ross Ludwig

## 2016-08-28 NOTE — Progress Notes (Signed)
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 Penicillins Hives, Itching and Swelling    Has patient had a PCN reaction causing immediate rash, facial/tongue/throat swelling, SOB or lightheadedness with hypotension: Yes Has patient had a PCN reaction causing severe rash involving mucus membranes or skin necrosis: No Has patient had a PCN reaction that required hospitalization No Has patient had a PCN reaction occurring within the last 10 years: No If all of the above answers are "NO", then may proceed with Cephalosporin use.    Patient Measurements: Height: 6\' 2"  (188 cm) Weight: 182 lb 15.7 oz (83 kg) IBW/kg (Calculated) : 82.2 Heparin Dosing Weight: 80 kg  Vital Signs: Temp: 97.4 F (36.3 C) (12/06 0426) Temp Source: Oral (12/06 0426) BP: 139/101 (12/06 0426) Pulse Rate: 68 (12/06 0426)  Labs:  Recent Labs  08/26/16 0458  08/26/16 1712 08/27/16 0556 08/27/16 0558 08/27/16 1020 08/28/16 0354  HGB 8.6*  --   --  8.6*  --  8.1* 8.3*  HCT 27.7*  --   --  28.3*  --  25.9* 26.7*  PLT 141*  --   --  156  --  157 146*  LABPROT 17.7*  --   --   --  21.1*  --  26.1*  INR 1.44  --   --   --  1.80  --  2.34  HEPARINUNFRC 0.28*  < > 0.35  --  0.34  --  0.42  CREATININE  --   --   --   --   --  7.06*  --   < > = values in this interval not displayed.  Estimated Creatinine Clearance: 10.7 mL/min (by C-G formula based on SCr of 7.06 mg/dL (H)).  Medical History: Past Medical History:  Diagnosis Date  . AAA (abdominal aortic aneurysm) ([place])   . Allergic rhinitis   . Anemia    a. baseline hgb ~ 8  . Bladder cancer ([place])   . Chronic combined systolic and diastolic CHF (congestive heart failure) ([place])    a. echo 12/2014: EF 25-30%, anterior wall wall motion abnormalities; b. planned ischemic evaluation once patient is  stable medically; c. echo 01/2015: EF 45-50%, no RWMA, mild AT, LA mildly dilated, mod pericardial effusion along LV free wall, no evidence of hemodynamic compromise  . Cognitive communication deficit   . COPD (chronic obstructive pulmonary disease) ([place])   . Dementia   . Detrusor sphincter dyssynergia   . Dysrhythmia   . ESRD on hemodialysis ([place])    a. Tuesday, Thursday, and Saturdays  . GERD (gastroesophageal reflux disease)   . History of septic shock    a. 01/2015  . History of small bowel obstruction    a. 01/2015  . Hydronephrosis   . Hypercholesteremia   . Hypertension   . 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 Incontinence   . Incontinence of urine   . Magnesium deficiency   . Mini stroke ([place])   . Overactive bladder   . PAF (paroxysmal atrial fibrillation) ([place])    a. new onset 12/2014 in the setting of UTI, sepsis, hypotension, and anemia; b. not on long term anticoagulation given anemia; c. family aware of stroke risk, they are ok with this; d. on amiodarone   . Prostate cancer ([place])   . Stroke [facility])     Medications:  Prescriptions Prior to  Admission  Medication Sig Dispense Refill Last Dose  . albuterol (PROVENTIL) (2.5 MG/3ML) 0.083% nebulizer solution Take 3 mLs (2.5 mg total) by nebulization every 4 (four) hours as needed for wheezing or shortness of breath. 75 mL 12 08/20/2016 at Unknown time  . Amino Acids-Protein Hydrolys (FEEDING SUPPLEMENT, PRO-STAT SUGAR FREE 64,) LIQD Take 30 mLs by mouth every evening.   08/20/2016 at Unknown time  . amiodarone (PACERONE) 200 MG tablet Take 1 tablet (200 mg total) by mouth daily. 30 tablet 5 08/20/2016 at Unknown time  . atorvastatin (LIPITOR) 40 MG tablet Take 40 mg by mouth at bedtime.   08/20/2016 at Unknown time  . budesonide-formoterol (SYMBICORT) 160-4.5 MCG/ACT inhaler Inhale 2 puffs into the lungs 2 (two) times daily.   08/20/2016 at Unknown time  . carvedilol (COREG) 3.125 MG tablet Take 1 tablet (3.125 mg total) by mouth 2 (two)  times daily with a meal. 60 tablet 2 08/20/2016 at Unknown time  . diclofenac sodium (VOLTAREN) 1 % GEL Apply 2 g topically 3 (three) times daily.    08/20/2016 at Unknown time  . ferrous sulfate 325 (65 FE) MG tablet Take 325 mg by mouth daily.   08/20/2016 at Unknown time  . folic acid (FOLVITE) [id number] MCG tablet Take 400 mcg by mouth daily.   08/20/2016 at Unknown time  . gabapentin (NEURONTIN) 100 MG capsule Take 100 mg by mouth at bedtime.    08/20/2016 at Unknown time  . guaiFENesin (MUCINEX) 600 MG 12 hr tablet Take 600 mg by mouth 2 (two) times daily. (Take every morning and at bedtime for cough)   08/20/2016 at Unknown time  . guaifenesin (ROBITUSSIN) 100 MG/5ML syrup Take 200 mg by mouth every 4 (four) hours as needed for cough.   Past Week at Unknown time  . ipratropium-albuterol (DUONEB) 0.5-2.5 (3) MG/3ML SOLN Take 3 mLs by nebulization 3 (three) times daily. May inhale an additional  54mls 3 times a day as needed for chronic obstructive pulmonary disease   08/20/2016 at Unknown time  . lamoTRIgine (LAMICTAL) 25 MG tablet Take 50 mg by mouth at bedtime. Start on July 14th.   08/20/2016 at Unknown time  . latanoprost (XALATAN) 0.005 % ophthalmic solution Place 1 drop into both eyes at bedtime.   08/20/2016 at Unknown time  . LORazepam (ATIVAN) 0.5 MG tablet Take 1 tablet (0.5 mg total) by mouth every 4 (four) hours as needed for anxiety. (Patient taking differently: Take 0.5 mg by mouth 2 (two) times daily. May take an additional 0.5mg  every 4 hours as needed for anxiety) 30 tablet 0 08/20/2016 at Unknown time  . magnesium oxide (MAG-OX) 400 MG tablet Take 1 tablet (400 mg total) by mouth every evening. 30 tablet 2 08/20/2016 at Unknown time  . omeprazole (PRILOSEC) 20 MG capsule Take 20 mg by mouth daily.   08/20/2016 at Unknown time  . ondansetron (ZOFRAN) 8 MG tablet Take by mouth every 8 (eight) hours as needed for nausea.   08/20/2016 at Unknown time  . oxyCODONE (OXY IR/ROXICODONE) 5 MG  immediate release tablet Take 1 tablet (5 mg total) by mouth every 6 (six) hours as needed for moderate pain or severe pain. 20 tablet 0 08/20/2016 at Unknown time  . OXYGEN Inhale 2-3 L into the lungs as needed. For shortness of breath, respiratory distress or O2 sats <90.   08/21/2016 at Unknown time  . sevelamer carbonate (RENVELA) 800 MG tablet Take 800 mg by mouth 3 (three) times daily.  08/20/2016 at Unknown time  . tiotropium (SPIRIVA) 18 MCG inhalation capsule Place 1 capsule (18 mcg total) into inhaler and inhale daily. 30 capsule 12 08/20/2016 at Unknown time  . [DISCONTINUED] acetaminophen (TYLENOL) 325 MG tablet Take 650 mg by mouth every 8 (eight) hours as needed. For general discomfort   Past Week at Unknown time   Scheduled:  . amiodarone  200 mg Oral Daily  . atorvastatin  40 mg Oral QHS  . carvedilol  3.125 mg Oral BID WC  . docusate sodium  100 mg Oral Daily  . epoetin (EPOGEN/PROCRIT) injection  10,000 Units Intravenous Q T,Th,Sa-HD  . feeding supplement (PRO-STAT SUGAR FREE 64)  30 mL Oral QPM  . ferrous sulfate  325 mg Oral Daily  . folic acid  [id number] mcg Oral Daily  . gabapentin  100 mg Oral QHS  . guaiFENesin  600 mg Oral BID  . ipratropium-albuterol  3 mL Nebulization TID  . lamoTRIgine  50 mg Oral QHS  . latanoprost  1 drop Both Eyes QHS  . magnesium oxide  400 mg Oral QPM  . mouth rinse  15 mL Mouth Rinse BID  . pantoprazole  40 mg Oral Daily  . sevelamer carbonate  800 mg Oral TID WC  . tiotropium  18 mcg Inhalation Daily  . warfarin  5 mg Oral q1800   Infusions:  . heparin 2,250 Units/hr (08/27/16 2308)  . 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   Baseline labs have been ordered STAT.  11/29 Case was discussed with vascular surgeon Dr. Delana Meyer - gave the patient an  initial bolus of 30 units/kg and target a HL of 0.3 - 0.5 units/mL  11/30 01:00 heparin level <0.1. 2400 unit bolus and increase rate to 1300 units/hr. Recheck in 8 hours.  Goal of Therapy:  Heparin level 0.3-0.5 units/ml Monitor platelets by anticoagulation protocol: Yes   Plan:  Heparin level=0.35, within goal. Continue drip at 2250 units/hr. This is the second therapeutic level in a row, therefore recheck in the AM along with a CBC.   08/28/2016 0354 HL therapeutic. Continue current rate. Will recheck HL/CBC daily.  Boyd Litaker A. [place], [place].D., BCPS Clinical Pharmacist 08/28/2016 6:39 AM

## 2016-08-28 NOTE — Discharge Summary (Signed)
[name]    Discharge Summary    Patient ID:  [name] MRN: [id number] DOB/AGE: 1941-07-15 75 y.o.  Admit date: 08/21/2016 Discharge date: 08/28/2016 Date of Surgery: 08/21/2016 Surgeon: Surgeon(s): Katha Cabal, MD  Admission Diagnosis: RT leg angio   ASO w rest pain     Anestheisa needed  Denny Peon was notified per Waynard Edwards  Discharge Diagnoses:  RT leg angio   ASO w rest pain     Anestheisa needed  Denny Peon was notified per Waynard Edwards  Secondary Diagnoses: Past Medical History:  Diagnosis Date  . AAA (abdominal aortic aneurysm) ([place])   . Allergic rhinitis   . Anemia    a. baseline hgb ~ 8  . Bladder cancer ([place])   . Chronic combined systolic and diastolic CHF (congestive heart failure) ([place])    a. echo 12/2014: EF 25-30%, anterior wall wall motion abnormalities; b. planned ischemic evaluation once patient is stable medically; c. echo 01/2015: EF 45-50%, no RWMA, mild AT, LA mildly dilated, mod pericardial effusion along LV free wall, no evidence of hemodynamic compromise  . Cognitive communication deficit   . COPD (chronic obstructive pulmonary disease) ([place])   . Dementia   . Detrusor sphincter dyssynergia   . Dysrhythmia   . ESRD on hemodialysis ([place])    a. Tuesday, Thursday, and Saturdays  . GERD (gastroesophageal reflux disease)   . History of septic shock    a. 01/2015  . History of small bowel obstruction    a. 01/2015  . Hydronephrosis   . Hypercholesteremia   . Hypertension   . 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 Incontinence   . Incontinence of urine   . Magnesium deficiency   . Mini stroke ([place])   . Overactive bladder   . PAF (paroxysmal atrial fibrillation) ([place])    a. new onset 12/2014 in the setting of UTI, sepsis, hypotension, and anemia; b. not on long term anticoagulation given anemia; c. family aware of stroke risk, they are ok with this; d. on amiodarone   . Prostate cancer ([place])   . Stroke [facility])      Procedure(s): LOWER EXTREMITY ANGIOGRAPHY Lower Extremity Intervention  Discharged Condition: good  HPI:  On the day of admission the patient underwent successful exclusion of his popliteal artery aneurysm right lower extremity. Also at that time multi-vessel tibial disease was treated.  This was done under general anesthesia and he was noted to be somewhat hypotensive in the recovery room. He was started on pressors and it was elected to have him transferred to the intensive care unit. Proximally 4 hours later when I went to make evening rounds he was off all his pressors he was eating supper he was talkative and telling me that his "right foot no longer was hurting when we going to do the left foot".  There were no further issues it was elected based on his very poor cardiac output and slow flow (injection of contrast through the sheath laced in his proximal superficial femoral artery on the right took over a minute to reach the ankle level indicating extremely slow flow) to initiate Coumadin therapy. At this point is essentially bedbound or wheelchair bound he is not particularly high risk for fall and anticoagulation should not be a problem in light of his atrial fibrillation this may actually provide an benefit from this diagnosis as well area over the next 5 days the patient's Coumadin was adjusted and on the  day of discharge his INR is now 2.4  Hospital Course:  [name] is a 75 y.o. male is S/P Right Procedure(s): LOWER EXTREMITY ANGIOGRAPHY Lower Extremity Intervention Extubated: POD # 0 Physical exam: He has a palpable posterior tibial pulse on the right his foot is warm his left foot is cool with weak Doppler signals and nonpalpable pulses Post-op wounds clean, dry, intact or healing well Pt. Ambulating, voiding and taking PO diet without difficulty. Pt pain controlled with PO pain meds. Labs as below Complications:none  Consults:  Treatment Team:  Anthonette Legato,  MD  Significant Diagnostic Studies: CBC Lab Results  Component Value Date   WBC 12.2 (H) 08/28/2016   HGB 8.3 (L) 08/28/2016   HCT 26.7 (L) 08/28/2016   MCV 81.8 08/28/2016   PLT 146 (L) 08/28/2016    BMET    Component Value Date/Time   NA 134 (L) 08/27/2016 1020   NA 141 01/20/2015 0430   K 4.7 08/27/2016 1020   K 3.6 01/20/2015 0430   CL 95 (L) 08/27/2016 1020   CL 99 (L) 01/20/2015 0430   CO2 25 08/27/2016 1020   CO2 30 01/20/2015 0430   GLUCOSE 122 (H) 08/27/2016 1020   GLUCOSE 119 (H) 01/20/2015 0430   BUN 78 (H) 08/27/2016 1020   BUN 32 (H) 01/20/2015 0430   CREATININE 7.06 (H) 08/27/2016 1020   CREATININE 6.73 (H) 01/20/2015 0430   CALCIUM 8.2 (L) 08/27/2016 1020   CALCIUM 7.8 (L) 01/20/2015 0430   GFRNONAA 7 (L) 08/27/2016 1020   GFRNONAA 7 (L) 01/20/2015 0430   GFRAA 8 (L) 08/27/2016 1020   GFRAA 9 (L) 01/20/2015 0430   COAG Lab Results  Component Value Date   INR 2.34 08/28/2016   INR 1.80 08/27/2016   INR 1.44 08/26/2016     Disposition:  Discharge to :Skilled nursing facility Discharge Instructions    Call MD for:  redness, tenderness, or signs of infection (pain, swelling, bleeding, redness, odor or green/yellow discharge around incision site)    Complete by:  As directed    Call MD for:  severe or increased pain, loss or decreased feeling  in affected limb(s)    Complete by:  As directed    Call MD for:  temperature >100.5    Complete by:  As directed    Resume previous diet    Complete by:  As directed        Medication List    TAKE these medications   albuterol (2.5 MG/3ML) 0.083% nebulizer solution Commonly known as:  PROVENTIL Take 3 mLs (2.5 mg total) by nebulization every 4 (four) hours as needed for wheezing or shortness of breath.   amiodarone 200 MG tablet Commonly known as:  PACERONE Take 1 tablet (200 mg total) by mouth daily.   atorvastatin 40 MG tablet Commonly known as:  LIPITOR Take 40 mg by mouth at bedtime.    budesonide-formoterol 160-4.5 MCG/ACT inhaler Commonly known as:  SYMBICORT Inhale 2 puffs into the lungs 2 (two) times daily.   carvedilol 3.125 MG tablet Commonly known as:  COREG Take 1 tablet (3.125 mg total) by mouth 2 (two) times daily with a meal.   feeding supplement (PRO-STAT SUGAR FREE 64) Liqd Take 30 mLs by mouth every evening.   ferrous sulfate 325 (65 FE) MG tablet Take 325 mg by mouth daily.   folic acid [id number] MCG tablet Commonly known as:  FOLVITE Take 400 mcg by mouth daily.   gabapentin 100 MG  capsule Commonly known as:  NEURONTIN Take 100 mg by mouth at bedtime.   guaifenesin 100 MG/5ML syrup Commonly known as:  ROBITUSSIN Take 200 mg by mouth every 4 (four) hours as needed for cough.   guaiFENesin 600 MG 12 hr tablet Commonly known as:  MUCINEX Take 600 mg by mouth 2 (two) times daily. (Take every morning and at bedtime for cough)   ipratropium-albuterol 0.5-2.5 (3) MG/3ML Soln Commonly known as:  DUONEB Take 3 mLs by nebulization 3 (three) times daily. May inhale an additional  80mls 3 times a day as needed for chronic obstructive pulmonary disease   lamoTRIgine 25 MG tablet Commonly known as:  LAMICTAL Take 50 mg by mouth at bedtime. Start on July 14th.   latanoprost 0.005 % ophthalmic solution Commonly known as:  XALATAN Place 1 drop into both eyes at bedtime.   LORazepam 0.5 MG tablet Commonly known as:  ATIVAN Take 1 tablet (0.5 mg total) by mouth every 4 (four) hours as needed for anxiety. What changed:  when to take this  additional instructions   magnesium oxide 400 MG tablet Commonly known as:  MAG-OX Take 1 tablet (400 mg total) by mouth every evening.   omeprazole 20 MG capsule Commonly known as:  PRILOSEC Take 20 mg by mouth daily.   ondansetron 8 MG tablet Commonly known as:  ZOFRAN Take by mouth every 8 (eight) hours as needed for nausea.   oxyCODONE 5 MG immediate release tablet Commonly known as:  Oxy  IR/ROXICODONE Take 1 tablet (5 mg total) by mouth every 6 (six) hours as needed for moderate pain or severe pain.   OXYGEN Inhale 2-3 L into the lungs as needed. For shortness of breath, respiratory distress or O2 sats <90.   sevelamer carbonate 800 MG tablet Commonly known as:  RENVELA Take 800 mg by mouth 3 (three) times daily.   tiotropium 18 MCG inhalation capsule Commonly known as:  SPIRIVA Place 1 capsule (18 mcg total) into inhaler and inhale daily.   VOLTAREN 1 % Gel Generic drug:  diclofenac sodium Apply 2 g topically 3 (three) times daily.   warfarin 4 MG tablet Commonly known as:  COUMADIN Take 1 tablet (4 mg total) by mouth daily at 6 PM.      Verbal and written Discharge instructions given to the patient. Wound care per Discharge AVS  Contact information for follow-up providers    Hortencia Pilar, MD Follow up in 3 week(s).   Specialties:  Vascular Surgery, Cardiology, Radiology, Vascular Surgery Contact information: [place] [address] [phone]            Contact information for after-discharge care    [facility] Follow up.   Specialty:  [place] information: [place] [phone]                  Signed: Hortencia Pilar, MD  08/28/2016, 1:47 PM

## 2016-08-28 NOTE — Progress Notes (Signed)
[facility]  ROUNDING NOTE   Subjective:   Patient with left lower extremity pain.   Hemodialysis yesterday. Tolerated treatment well. UF of 1.5 litres.   Objective:  Vital signs in last 24 hours:  Temp:  [97.4 F (36.3 C)-98.4 F (36.9 C)] 98.4 F (36.9 C) (12/06 0850) Pulse Rate:  [68-105] 68 (12/06 0850) Resp:  [15-20] 19 (12/06 0426) BP: (92-139)/(43-101) 98/43 (12/06 0850) SpO2:  [85 %-100 %] 98 % (12/06 0928) Weight:  [83 kg (182 lb 15.7 oz)] 83 kg (182 lb 15.7 oz) (12/05 1321)  Weight change:  Filed Weights   08/24/16 1555 08/27/16 1012 08/27/16 1321  Weight: 84 kg (185 lb 3 oz) 84.6 kg (186 lb 8.2 oz) 83 kg (182 lb 15.7 oz)    Intake/Output: I/O last 3 completed shifts: In: 180 [I.V.:180] Out: 1500 [Other:1500]   Intake/Output this shift:  No intake/output data recorded.  Physical Exam: General: No acute distress  Head: Normocephalic, atraumatic. Moist oral mucosal membranes  Eyes: Anicteric  Neck: Supple, trachea midline  Lungs:  Scattered rhonchi, normal effort  Heart: S1S2 irregular  Abdomen:  Soft, nontender, bowel sounds present   Extremities:  no peripheral edema. Left lower extremity cold  Neurologic: Nonfocal, moving all four extremities  Skin: No lesions  Access: LUE AVF    Basic Metabolic Panel:  Recent Labs Lab 08/21/16 2015 08/24/16 0603 08/27/16 1020  NA  --   --  134*  K  --   --  4.7  CL  --   --  95*  CO2  --   --  25  GLUCOSE  --   --  122*  BUN  --   --  78*  CREATININE 5.85*  --  7.06*  CALCIUM  --   --  8.2*  PHOS  --  4.4 6.2*    Liver Function Tests:  Recent Labs Lab 08/27/16 1020  ALBUMIN 2.3*   No results for input(s): LIPASE, AMYLASE in the last 168 hours. No results for input(s): AMMONIA in the last 168 hours.  CBC:  Recent Labs Lab 08/25/16 0051 08/26/16 0458 08/27/16 0556 08/27/16 1020 08/28/16 0354  WBC 8.7 11.4* 13.7* 13.6* 12.2*  HGB 9.2* 8.6* 8.6* 8.1* 8.3*  HCT 30.1* 27.7*  28.3* 25.9* 26.7*  MCV 82.5 81.1 82.3 81.4 81.8  PLT 132* 141* 156 157 146*    Cardiac Enzymes: No results for input(s): CKTOTAL, CKMB, CKMBINDEX, TROPONINI in the last 168 hours.  BNP: Invalid input(s): POCBNP  CBG:  Recent Labs Lab 08/22/16 1557 08/25/16 0748 08/25/16 1247 08/25/16 1636 08/26/16 0751  GLUCAP 132* 86 133* 131* 91    Microbiology: Results for orders placed or performed during the hospital encounter of 08/21/16  MRSA PCR Screening     Status: None   Collection Time: 08/21/16 11:00 AM  Result Value Ref Range Status   MRSA by PCR NEGATIVE NEGATIVE Final    Comment:        The GeneXpert MRSA Assay (FDA approved for NASAL specimens only), is one component of a comprehensive MRSA colonization surveillance program. It is not intended to diagnose MRSA infection nor to guide or monitor treatment for MRSA infections.     Coagulation Studies:  Recent Labs  08/26/16 0458 08/27/16 0558 08/28/16 0354  LABPROT 17.7* 21.1* 26.1*  INR 1.44 1.80 2.34    Urinalysis: No results for input(s): COLORURINE, LABSPEC, PHURINE, GLUCOSEU, HGBUR, BILIRUBINUR, KETONESUR, PROTEINUR, UROBILINOGEN, NITRITE, LEUKOCYTESUR in the last 72 hours.  Invalid  input(s): APPERANCEUR    Imaging: No results found.   Medications:   . heparin 2,250 Units/hr (08/27/16 2308)  . phenylephrine (NEO-SYNEPHRINE) Adult infusion Stopped (08/22/16 0701)   . amiodarone  200 mg Oral Daily  . atorvastatin  40 mg Oral QHS  . carvedilol  3.125 mg Oral BID WC  . docusate sodium  100 mg Oral Daily  . epoetin (EPOGEN/PROCRIT) injection  10,000 Units Intravenous Q T,Th,Sa-HD  . feeding supplement (PRO-STAT SUGAR FREE 64)  30 mL Oral QPM  . ferrous sulfate  325 mg Oral Daily  . folic acid  [id number] mcg Oral Daily  . gabapentin  100 mg Oral QHS  . guaiFENesin  600 mg Oral BID  . ipratropium-albuterol  3 mL Nebulization TID  . lamoTRIgine  50 mg Oral QHS  . latanoprost  1 drop Both Eyes QHS   . magnesium oxide  400 mg Oral QPM  . mouth rinse  15 mL Mouth Rinse BID  . pantoprazole  40 mg Oral Daily  . sevelamer carbonate  800 mg Oral TID WC  . tiotropium  18 mcg Inhalation Daily  . warfarin  4 mg Oral q1800  . 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  ESRD on HD TTS: hemodialysis yesterday. Tolerated treatment. Next treatment for tomorrow.   2. Peripheral Vascular Disease:  Status post angiogram and Viabahn stent graft placement. - transitioned to warfarin  3.  Anemia chronic kidney disease.  Hemoglobin 8.3 - Continue Epogen 10,000 units IV with dialysis. - fe sulfate  4.  Secondary hyperparathyroidism.  - Continue Renvela 800 mg by mouth 3 times a day with meals.   5. Hypertension with atrial fibrillation: blood pressure at goal.  - carvedilol   LOS: [place], [place] 12/6/201711:26 AM

## 2016-08-28 NOTE — Progress Notes (Signed)
Physical Therapy Treatment Patient Details Name: [name] MRN: [id number] DOB: 28-Dec-1940 Today's Date: 08/28/2016    History of Present Illness      PT Comments    Pt agreeable to PT; reports 8/10 pain in Left lower extremity from hip to ankle. Pt agreeable to repositioning up into chair. Pt is max A for bed mobility supine to sit edge of bed. Sits with bilateral upper extremity support at edge of bed without assist. Slide board lateral scoot with total assist of 2 to chair. Pt continues to experience pain in left hip with seated position; improved in time with continued work on positioning with pillows for lean to R and pressure relief under left buttock. Pt encouraged to tolerate up in chair for up to an hour if tolerable. Pt understands to call nursing when in need of return to bed. Continue PT to progress endurance and strength and out of bed tolerance to improve functional mobility.   Follow Up Recommendations  SNF     Equipment Recommendations  None recommended by PT    Recommendations for Other Services       Precautions / Restrictions Precautions Precautions: Fall Restrictions Weight Bearing Restrictions: No    Mobility  Bed Mobility Overal bed mobility: Needs Assistance Bed Mobility: Supine to Sit     Supine to sit: Max assist;+2 for physical assistance     General bed mobility comments: Assist for LEs and trunk; can sit without assist once to edge of bed  Transfers Overall transfer level: Needs assistance Equipment used:  (slideboard) Transfers: Lateral/Scoot Transfers          Lateral/Scoot Transfers: +2 physical assistance;Total assist General transfer comment: Unable to assist with transfer  Ambulation/Gait             General Gait Details: unable    Stairs            Wheelchair Mobility    Modified Rankin (Stroke Patients Only)       Balance                                    Cognition  Arousal/Alertness: Awake/alert Behavior During Therapy: WFL for tasks assessed/performed Overall Cognitive Status: Within Functional Limits for tasks assessed                      Exercises      General Comments        Pertinent Vitals/Pain Pain Assessment: 0-10 Pain Score: 8  Pain Location: LLE (hip to ankle) Pain Intervention(s): Repositioned    Home Living                      Prior Function            PT Goals (current goals can now be found in the care plan section) Progress towards PT goals: Progressing toward goals    Frequency    Min 2X/week      PT Plan Current plan remains appropriate    Co-evaluation             End of Session Equipment Utilized During Treatment: Oxygen Activity Tolerance: Patient tolerated treatment well;Patient limited by pain;Other (comment) (severe weakness; B knee contractures R > L) Patient left: in chair;with call bell/phone within reach;with chair alarm set     Time: [id number] PT Time Calculation (min) (ACUTE ONLY): 34 min  Charges:  $Therapeutic Activity: 23-37 mins                    G Codes:      Larae Grooms, PTA 08/28/2016, 11:48 AM

## 2016-08-28 NOTE — Progress Notes (Signed)
Report given to Ander Gaster LPN at [facility]

## 2016-08-28 NOTE — Progress Notes (Signed)
ANTICOAGULATION CONSULT NOTE - Initial Consult  Pharmacy Consult for Warfarin Indication: cardiomyopathy  Allergies  Allergen Reactions  . Penicillins Hives, Itching and Swelling    Has patient had a PCN reaction causing immediate rash, facial/tongue/throat swelling, SOB or lightheadedness with hypotension: Yes Has patient had a PCN reaction causing severe rash involving mucus membranes or skin necrosis: No Has patient had a PCN reaction that required hospitalization No Has patient had a PCN reaction occurring within the last 10 years: No If all of the above answers are "NO", then may proceed with Cephalosporin use.    Patient Measurements: Height: 6\' 2"  (188 cm) Weight: 182 lb 15.7 oz (83 kg) IBW/kg (Calculated) : 82.2  Vital Signs: Temp: 97.4 F (36.3 C) (12/06 0426) Temp Source: Oral (12/06 0426) BP: 139/101 (12/06 0426) Pulse Rate: 68 (12/06 0426)  Labs:  Recent Labs  08/26/16 0458  08/26/16 1712 08/27/16 0556 08/27/16 0558 08/27/16 1020 08/28/16 0354  HGB 8.6*  --   --  8.6*  --  8.1* 8.3*  HCT 27.7*  --   --  28.3*  --  25.9* 26.7*  PLT 141*  --   --  156  --  157 146*  LABPROT 17.7*  --   --   --  21.1*  --  26.1*  INR 1.44  --   --   --  1.80  --  2.34  HEPARINUNFRC 0.28*  < > 0.35  --  0.34  --  0.42  CREATININE  --   --   --   --   --  7.06*  --   < > = values in this interval not displayed.  Estimated Creatinine Clearance: 10.7 mL/min (by C-G formula based on SCr of 7.06 mg/dL (H)).   Medical History: Past Medical History:  Diagnosis Date  . AAA (abdominal aortic aneurysm) ([place])   . Allergic rhinitis   . Anemia    a. baseline hgb ~ 8  . Bladder cancer ([place])   . Chronic combined systolic and diastolic CHF (congestive heart failure) ([place])    a. echo 12/2014: EF 25-30%, anterior wall wall motion abnormalities; b. planned ischemic evaluation once patient is stable medically; c. echo 01/2015: EF 45-50%, no RWMA, mild AT, LA mildly dilated, mod pericardial  effusion along LV free wall, no evidence of hemodynamic compromise  . Cognitive communication deficit   . COPD (chronic obstructive pulmonary disease) ([place])   . Dementia   . Detrusor sphincter dyssynergia   . Dysrhythmia   . ESRD on hemodialysis ([place])    a. Tuesday, Thursday, and Saturdays  . GERD (gastroesophageal reflux disease)   . History of septic shock    a. 01/2015  . History of small bowel obstruction    a. 01/2015  . Hydronephrosis   . Hypercholesteremia   . Hypertension   . 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 Incontinence   . Incontinence of urine   . Magnesium deficiency   . Mini stroke ([place])   . Overactive bladder   . PAF (paroxysmal atrial fibrillation) ([place])    a. new onset 12/2014 in the setting of UTI, sepsis, hypotension, and anemia; b. not on long term anticoagulation given anemia; c. family aware of stroke risk, they are ok with this; d. on amiodarone   . Prostate cancer ([place])   . Stroke [facility])     Medications:  Scheduled:  . amiodarone  200 mg Oral Daily  . atorvastatin  40 mg Oral QHS  .  carvedilol  3.125 mg Oral BID WC  . docusate sodium  100 mg Oral Daily  . epoetin (EPOGEN/PROCRIT) injection  10,000 Units Intravenous Q T,Th,Sa-HD  . feeding supplement (PRO-STAT SUGAR FREE 64)  30 mL Oral QPM  . ferrous sulfate  325 mg Oral Daily  . folic acid  [id number] mcg Oral Daily  . gabapentin  100 mg Oral QHS  . guaiFENesin  600 mg Oral BID  . ipratropium-albuterol  3 mL Nebulization TID  . lamoTRIgine  50 mg Oral QHS  . latanoprost  1 drop Both Eyes QHS  . magnesium oxide  400 mg Oral QPM  . mouth rinse  15 mL Mouth Rinse BID  . pantoprazole  40 mg Oral Daily  . sevelamer carbonate  800 mg Oral TID WC  . tiotropium  18 mcg Inhalation Daily  . warfarin  5 mg Oral q1800   Infusions:  . heparin 2,250 Units/hr (08/27/16 2308)  . phenylephrine (NEO-SYNEPHRINE) Adult infusion Stopped (08/22/16 0701)    Assessment: Pharmacy consulted to dose and monitor warfarin for this 75  yo male for cardiomyopathy and PAD s/p repair of popliteal artery aneurysm.  Patient has been on heparin drip since 11/29. Patient is also on amiodarone which can increase the effects of warfarin. Lower starting dose has been used due to this DDI  12/1  INR= 1.13  Warfarin 2.5mg  given 12/2 INR = 1.22  Warfarin 2.5mg  12/3 INR= 1.39   Warfarin 2.5mg  12/4 INR= 1.44   Warfarin 5mg  12/5 INR= 1.80   Warfarin 5mg  12/6 INR =2.34    Goal of Therapy:  INR 2-3 Monitor platelets by anticoagulation protocol: Yes   Plan:  INR therapeutic at 2.35. Will try a daily dose of 4mg . Recheck INR in the AM.  Ramond Dial, Pharm.D, BCPS Clinical Pharmacist  08/28/2016,7:57 AM

## 2016-08-29 NOTE — Care Management (Signed)
Faxed discharge summary to [facility] with [facility]

## 2016-09-19 ENCOUNTER — Encounter (INDEPENDENT_AMBULATORY_CARE_PROVIDER_SITE_OTHER): Payer: Medicare Other | Admitting: Vascular Surgery

## 2016-09-23 DEATH — deceased

## 2016-11-22 ENCOUNTER — Ambulatory Visit: Payer: Medicare Other

## 2016-11-22 ENCOUNTER — Other Ambulatory Visit: Payer: Medicare Other

## 2016-11-22 ENCOUNTER — Ambulatory Visit: Payer: Medicare Other | Admitting: Oncology

## 2016-11-22 ENCOUNTER — Ambulatory Visit: Payer: Medicare Other | Admitting: Hematology and Oncology

## 2017-02-12 ENCOUNTER — Other Ambulatory Visit: Payer: Medicare Other | Admitting: Urology

## 2017-02-19 ENCOUNTER — Other Ambulatory Visit: Payer: Medicare Other | Admitting: Urology

## 2017-02-19 ENCOUNTER — Encounter: Payer: Self-pay | Admitting: Urology

## 2017-03-01 IMAGING — CT CT ABD-PELV W/ CM
2 of 7 series · 13 of 32 positions shown, 18 images · IV contrast (omnipaque)
Comparison: 06/24/2014

CLINICAL DATA: Abdominal pain and bile emesis

EXAM:
CT ABDOMEN AND PELVIS WITH CONTRAST
TECHNIQUE: Multidetector CT imaging of the abdomen and pelvis was performed
using the standard protocol following bolus administration of
intravenous contrast.
CONTRAST:  100mL OMNIPAQUE IOHEXOL 300 MG/ML SOLN, 25mL OMNIPAQUE
IOHEXOL 240 MG/ML SOLN

[Series 2: routine abd pel with · axial · 0.68mm/px · z∈[-404,-54]mm · 8 of 92 slices shown, 13 images (1 of 2)]
[im 11/92  soft-tissue]
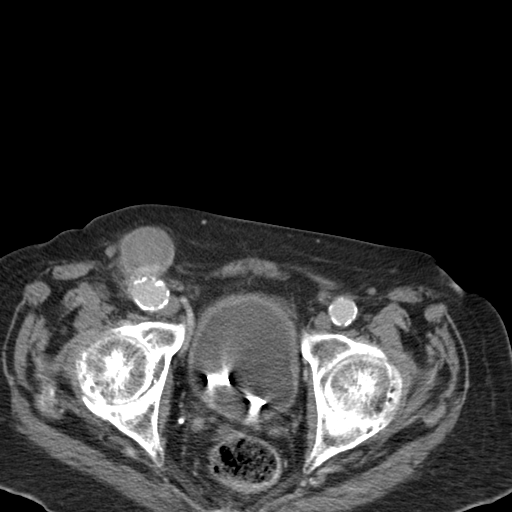
[im 11/92  bone]
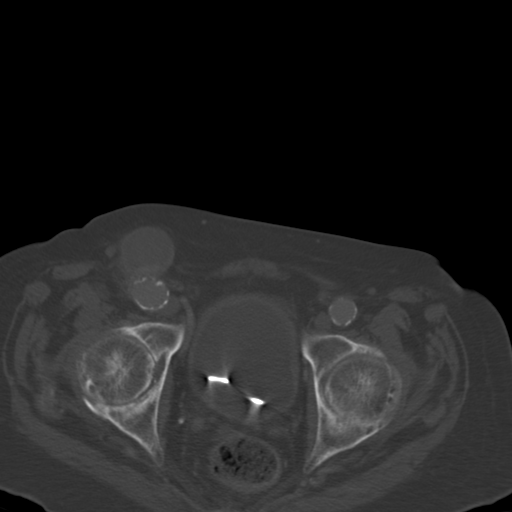
[im 21/92  soft-tissue]
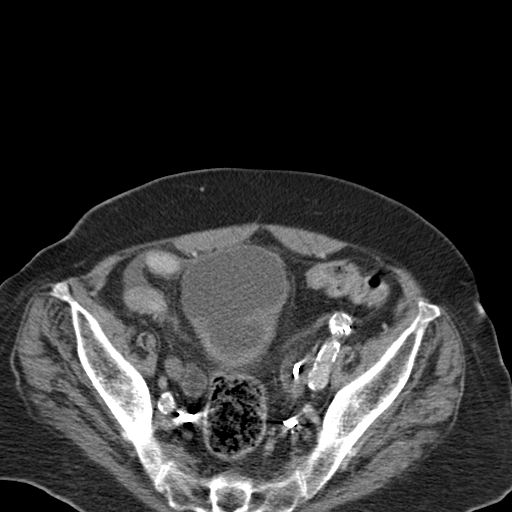
[im 31/92  soft-tissue]
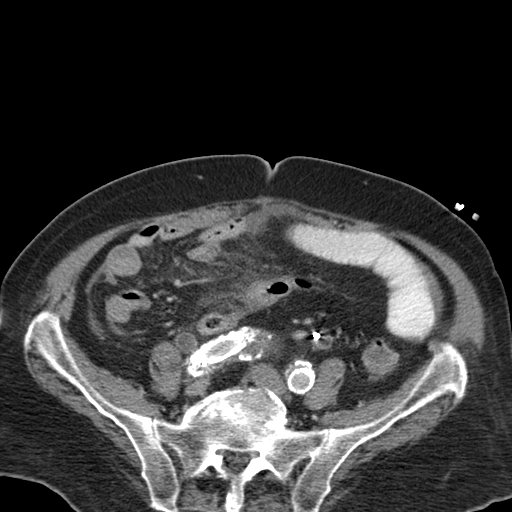
[im 41/92  soft-tissue]
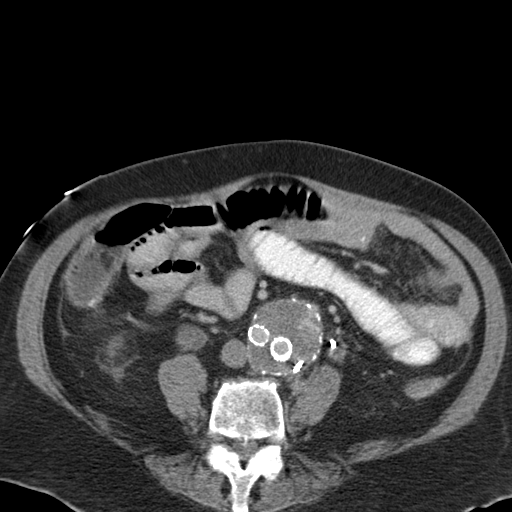
[im 51/92  soft-tissue]
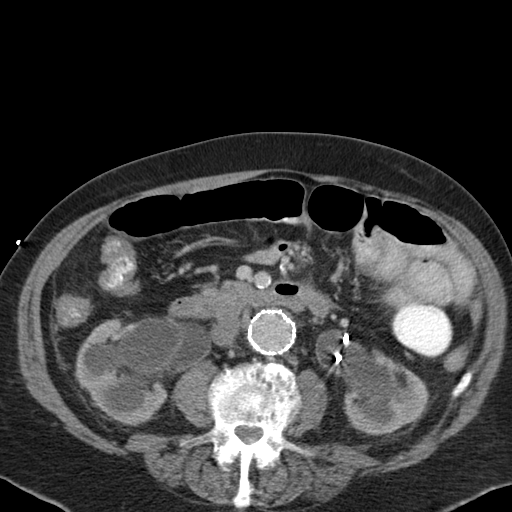
[im 51/92  lung]
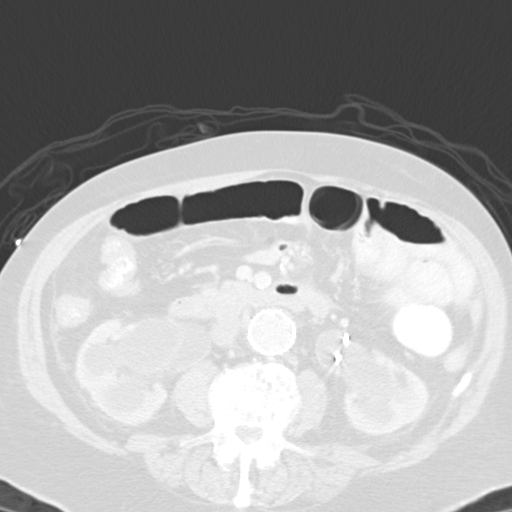
[im 61/92  soft-tissue]
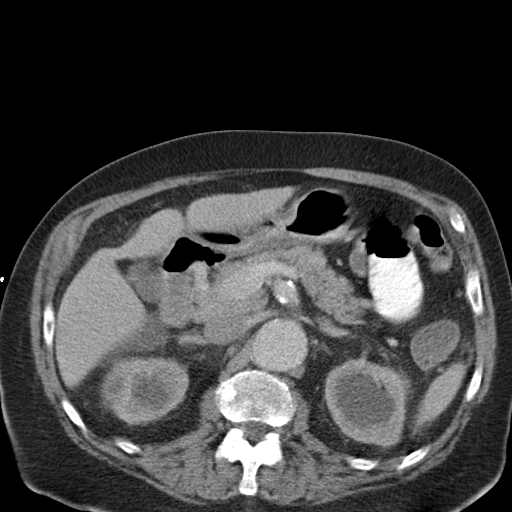
[im 61/92  lung]
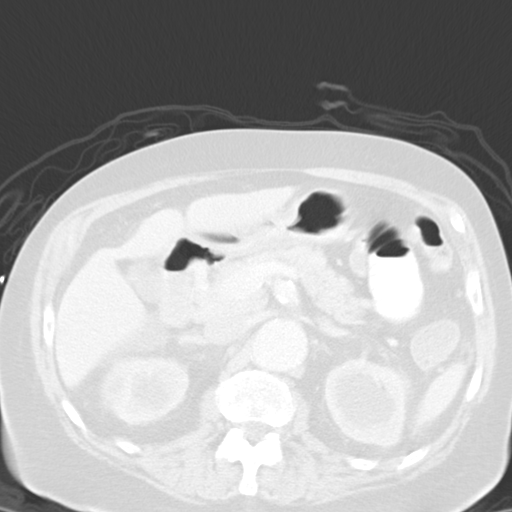
[im 71/92  soft-tissue]
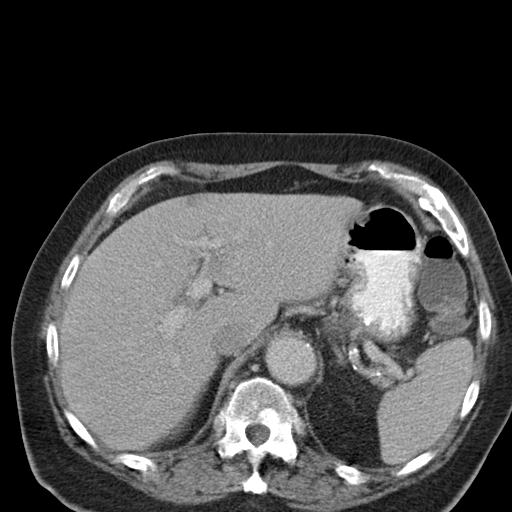
[im 71/92  lung]
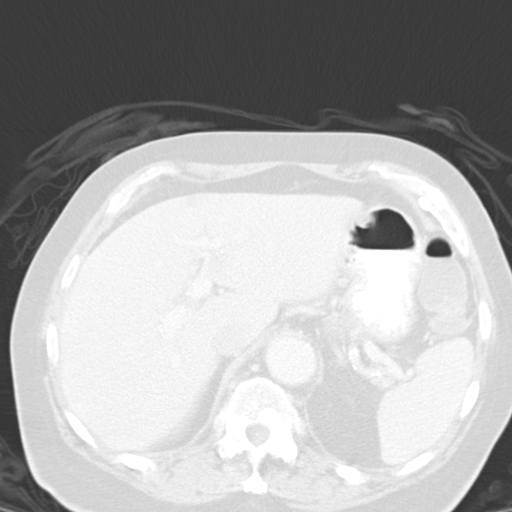
[im 81/92  soft-tissue]
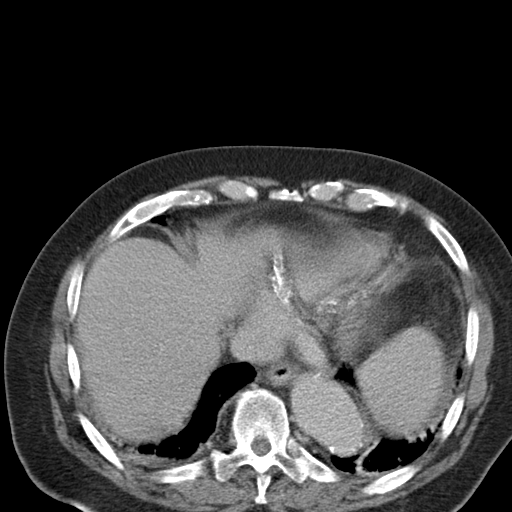
[im 81/92  lung]
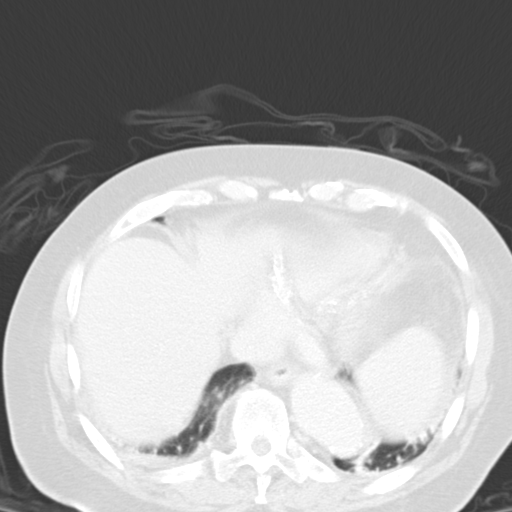

[Series 8: routine abd pel with · axial · 0.68mm/px · z∈[-404,-204]mm · 5 of 92 slices shown (2 of 2)]
[im 11/92  soft-tissue]
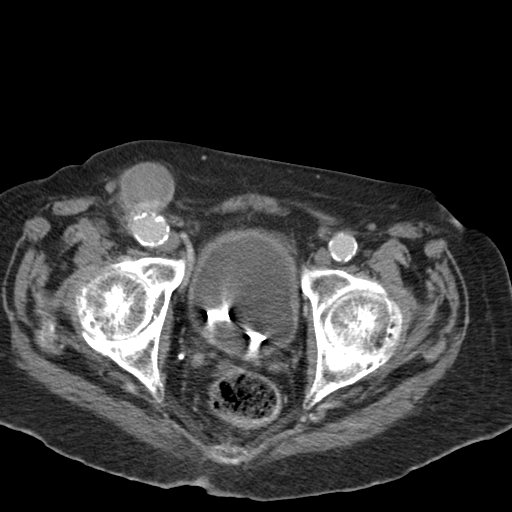
[im 21/92  soft-tissue]
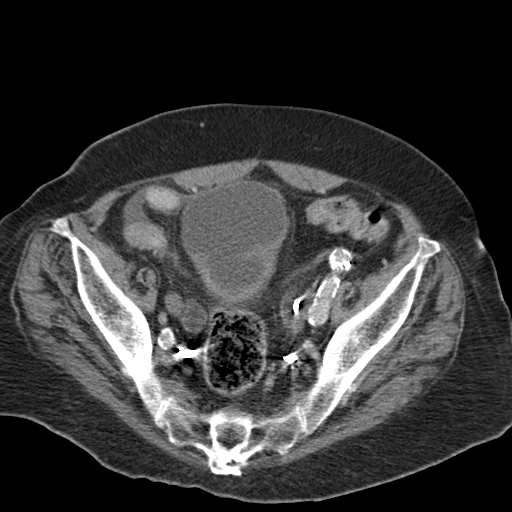
[im 31/92  soft-tissue]
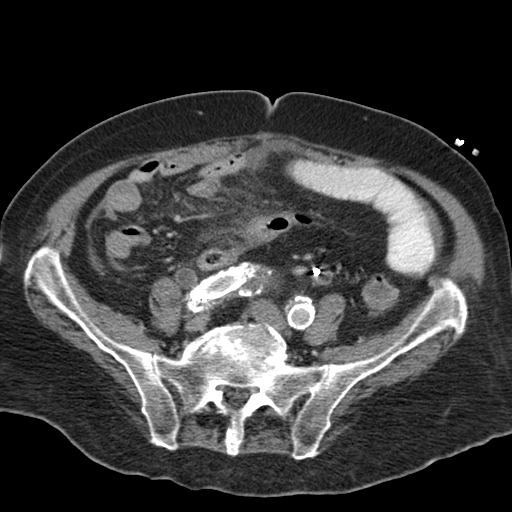
[im 41/92  soft-tissue]
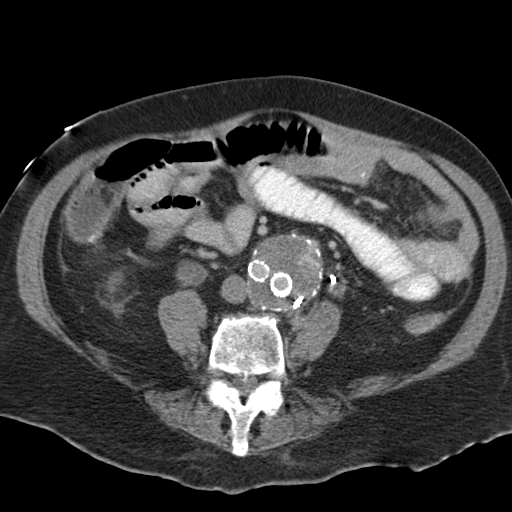
[im 51/92  soft-tissue]
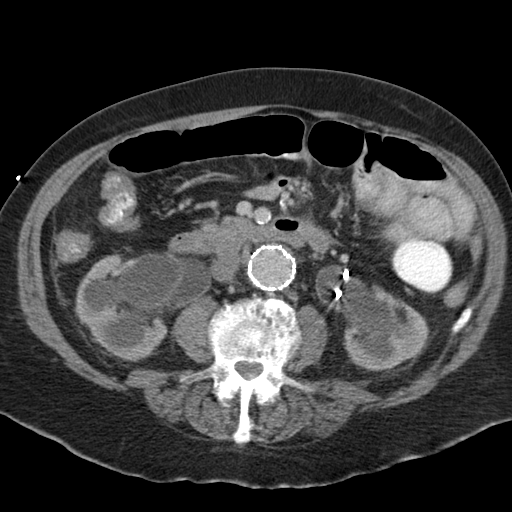

[13 of 32 positions shown; findings below may reference images not displayed]

FINDINGS: Mild bibasilar atelectasis is seen. No sizable effusion is noted.
Cardiac shadow is mildly enlarged.

The liver, gallbladder, spleen, adrenal glands and pancreas are
within normal limits. Kidneys are again well visualized and
demonstrates significant bilateral hydronephrosis. A left ureteral
stent is noted in satisfactory position. A large superior bladder
diverticulum is noted.

An aortic stent graft is identified with evidence of a type 2
endoleak with significant enhancement of the aneurysm sac. Endoleak
is fed predominantly by the inferior mesenteric artery. The aneurysm
sac measures 5.5 x 5.2 cm and is roughly stable from the prior exam.
The stent graft is patent. Bilateral internal iliac artery
embolization is again noted. Aneurysmal dilatation of the right
common femoral artery is again seen extending to the level of the
femoral bifurcation. The overall size is stable at 6.1 x 6.2 cm. No
acute extravasation is seen.

Bladder is well distended. No pelvic mass lesion or sidewall
abnormality is seen. The appendix is not visualized although no
inflammatory changes are seen. Very mild small bowel dilatation is
seen without definitive mass lesion although some wall thickening is
noted in the midportion of the ileum a with a relatively normal
small bowel loops distally. No definitive mass lesion is seen. No
acute bony abnormality is noted.
IMPRESSION: Stable but persistent bilateral hydronephrosis despite ureteral
stent placement on the left. The right ureteral stent has been
removed in the interval.

Changes consistent with aortic stent graft with type 2 endoleak fed
predominantly by the inferior mesenteric artery. Lumbar supply may
be present as well. The overall size of the aneurysm sac however
stable from the prior exam. Vascular surgery consultation is
recommended.

Chronic right common femoral artery aneurysm roughly stable from the
prior exam.

Large superior bladder diverticulum.

Small bowel dilatation with an area of wall thickening and narrowing
in the mid ileum. No definitive mass lesion is seen. This may simply
represent some enteritis.

## 2017-03-06 IMAGING — CR DG ABDOMEN 1V
1 series · 2 of 2 positions shown · non-contrast
Comparison: Abdominal radiograph February 02, 2015; CT abdomen and
pelvis March 21, 2015

CLINICAL DATA: Ileus

EXAM:
ABDOMEN - 1 VIEW

[Series 1: dg abd 1 view · 0.14mm/px · 2 of 2 slices shown]
[im 1/2]
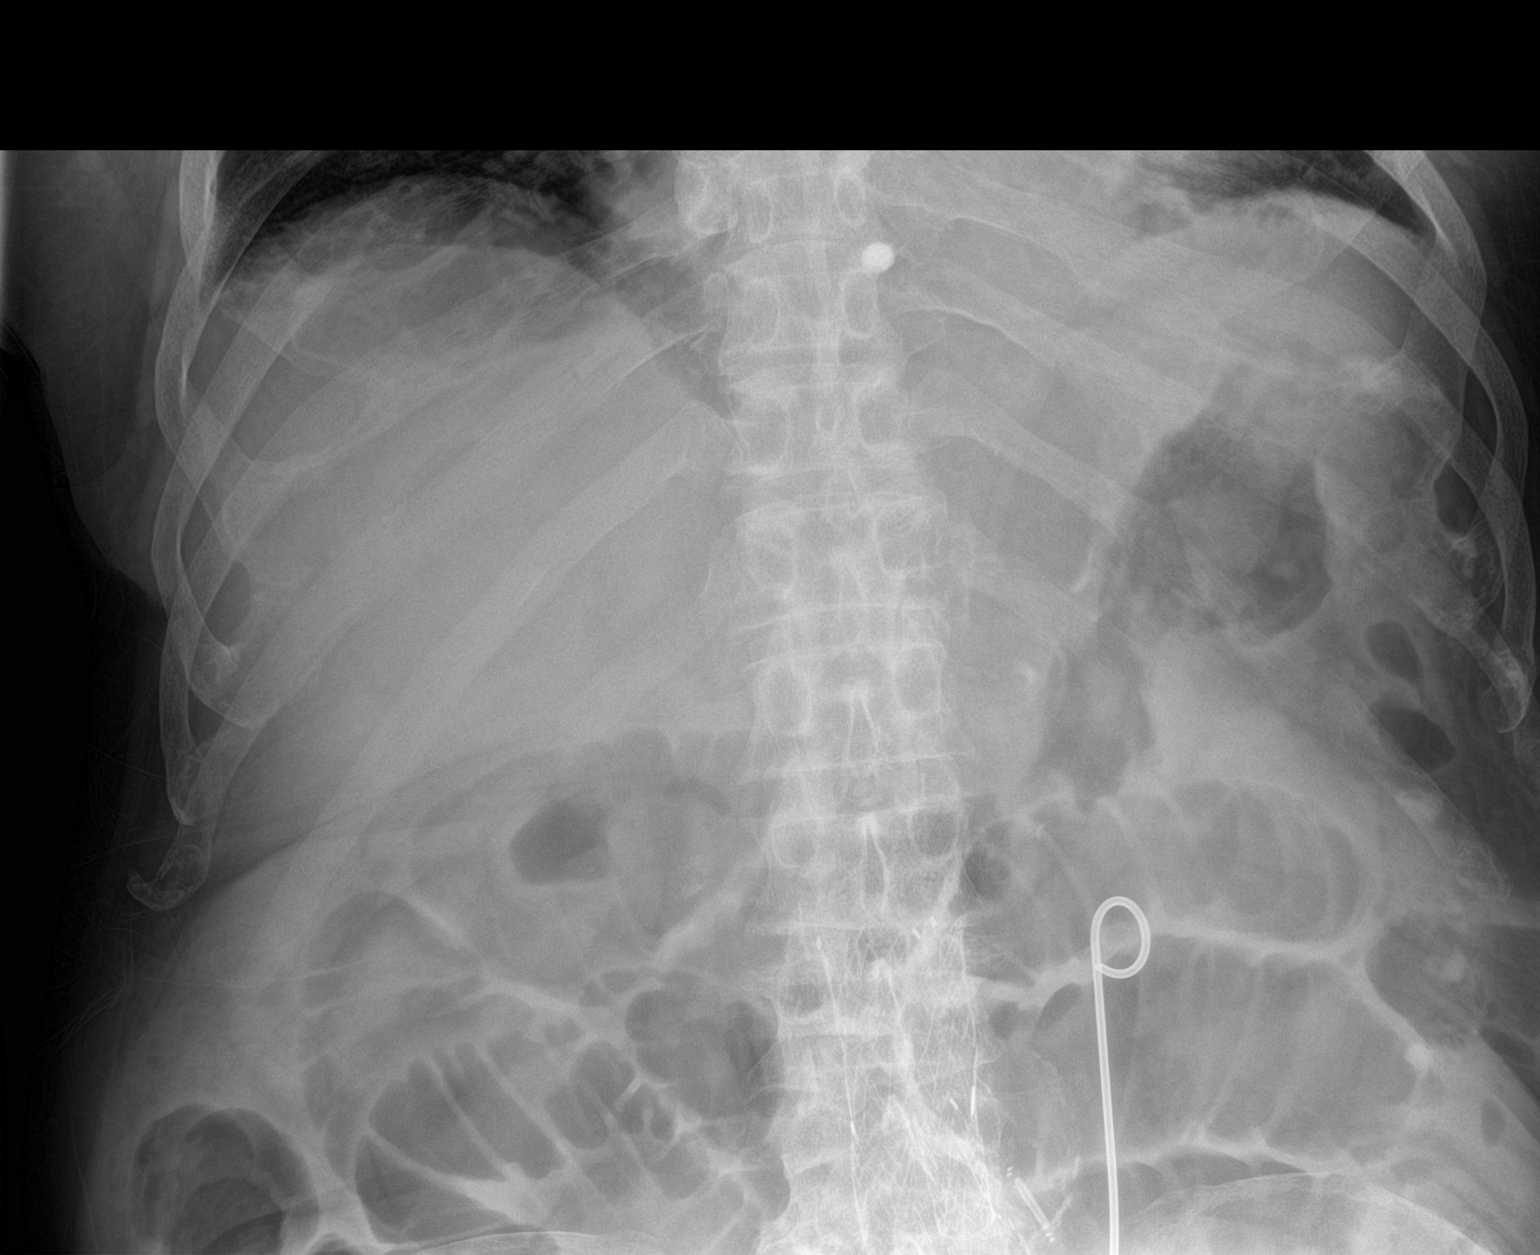
[im 2/2]
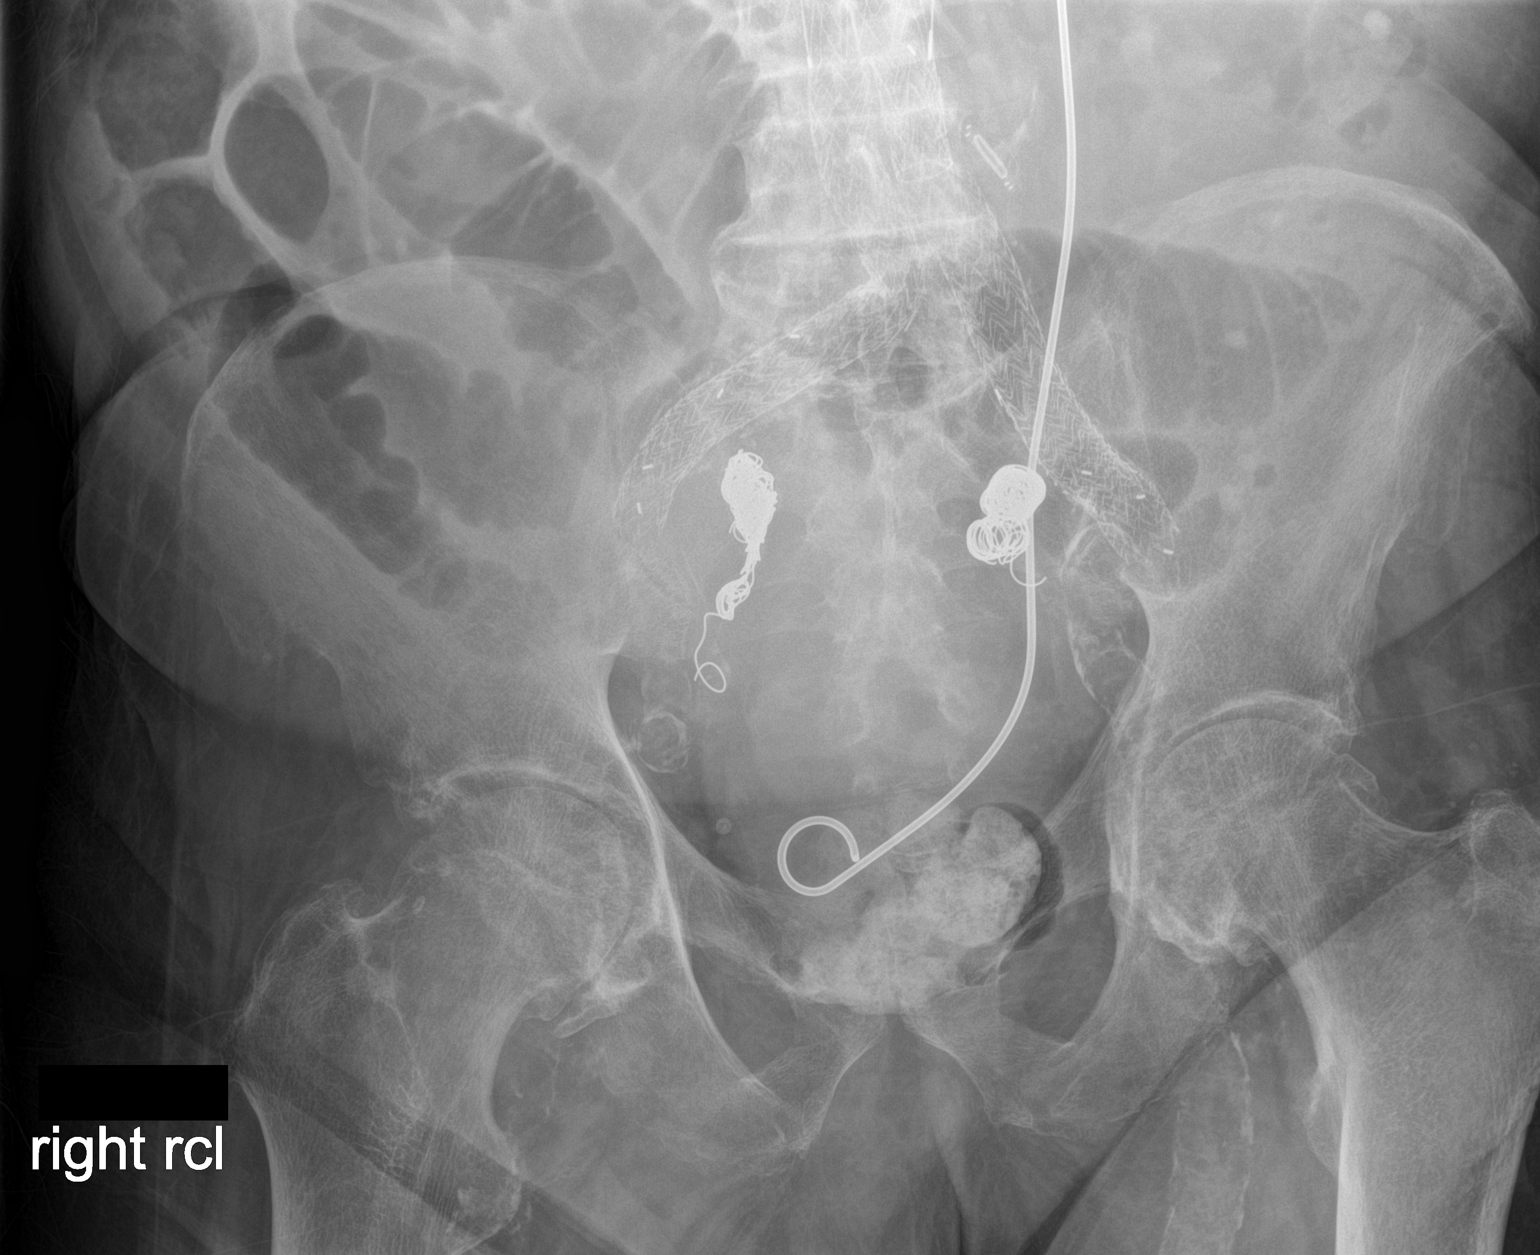

[2 of 2 positions shown; findings below may reference images not displayed]

FINDINGS: There is a persistent double-J stent on the left from the level of
L2-3 to the bladder. There remain loops of dilated bowel in a
pattern suggestive of ileus. No well-defined obstruction is seen.
There is air in the rectum. There is a stent in the abdominal aorta
and iliac arteries with evidence of native abdominal aortic
aneurysm. There are coils in the regions of each hypogastric artery
in the pelvis. There is moderate osteoarthritic change in both hip
joints.
IMPRESSION: Bowel gas pattern appearance is consistent with persistent ileus. No
free air seen. Aorto bi-iliac stent with native abdominal aortic
aneurysm apparent. Double-J stent present on left.

## 2017-07-06 IMAGING — CR DG CHEST 1V PORT
1 series · 1 of 1 positions shown · non-contrast
Comparison: 07/18/2015

CLINICAL DATA: Respiratory distress

EXAM:
PORTABLE CHEST 1 VIEW

[ap]
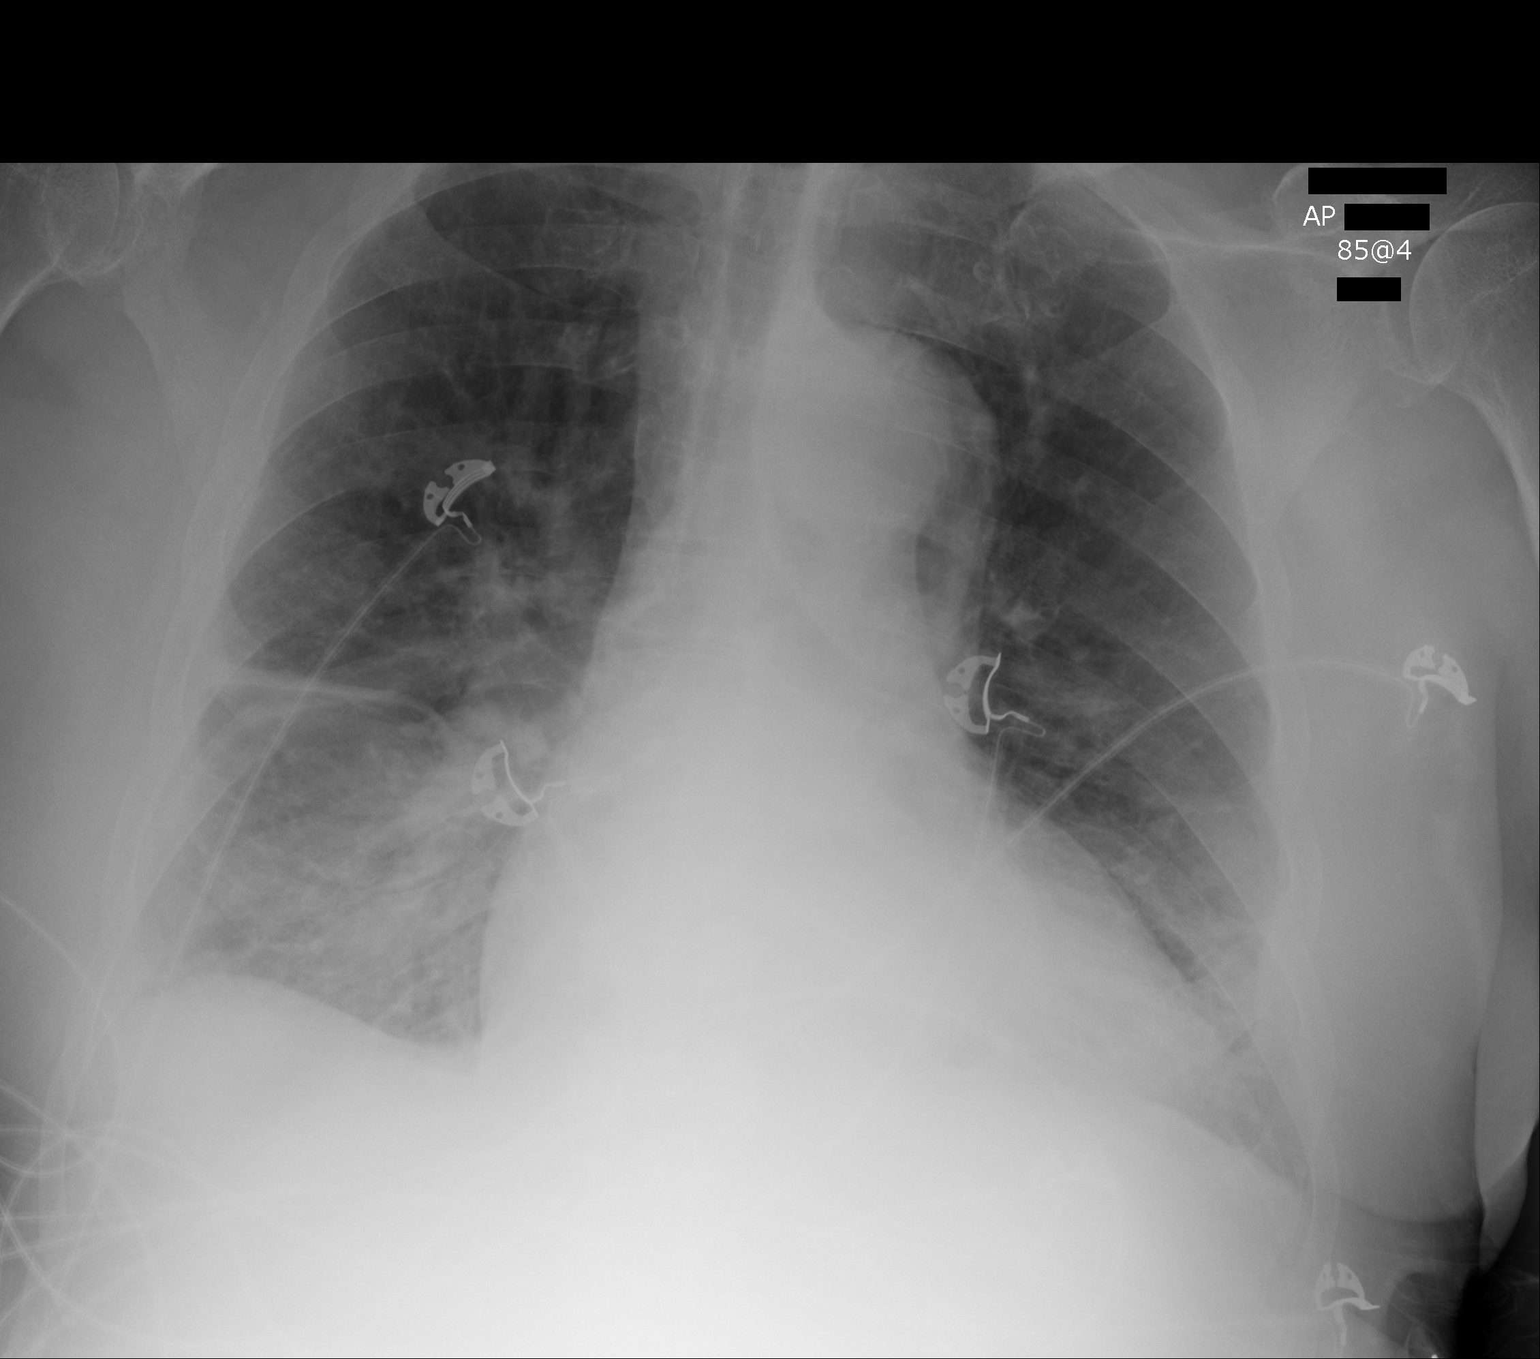

[1 of 1 positions shown; findings below may reference images not displayed]

FINDINGS: Normal heart size. Mild diffuse edema identified. Right pleural
effusion unchanged. No airspace consolidation noted.
IMPRESSION: 1. Mild CHF.
